# Patient Record
Sex: Female | Born: 1970
Health system: Southern US, Community
[De-identification: ages and names within clinical notes are randomized; demographics above are authoritative.]

## PROBLEM LIST (undated history)

## (undated) DIAGNOSIS — C419 Malignant neoplasm of bone and articular cartilage, unspecified: Secondary | ICD-10-CM

## (undated) DIAGNOSIS — C7951 Secondary malignant neoplasm of bone: Secondary | ICD-10-CM

## (undated) DIAGNOSIS — R918 Other nonspecific abnormal finding of lung field: Secondary | ICD-10-CM

## (undated) DIAGNOSIS — E876 Hypokalemia: Secondary | ICD-10-CM

## (undated) DIAGNOSIS — C7970 Secondary malignant neoplasm of unspecified adrenal gland: Secondary | ICD-10-CM

## (undated) DIAGNOSIS — IMO0001 Reserved for inherently not codable concepts without codable children: Secondary | ICD-10-CM

## (undated) DIAGNOSIS — J069 Acute upper respiratory infection, unspecified: Secondary | ICD-10-CM

## (undated) DIAGNOSIS — C801 Malignant (primary) neoplasm, unspecified: Secondary | ICD-10-CM

## (undated) DIAGNOSIS — C7931 Secondary malignant neoplasm of brain: Secondary | ICD-10-CM

## (undated) DIAGNOSIS — IMO0002 Reserved for concepts with insufficient information to code with codable children: Secondary | ICD-10-CM

## (undated) DIAGNOSIS — R042 Hemoptysis: Secondary | ICD-10-CM

## (undated) DIAGNOSIS — J189 Pneumonia, unspecified organism: Secondary | ICD-10-CM

## (undated) HISTORY — DX: Reserved for concepts with insufficient information to code with codable children: IMO0002

## (undated) HISTORY — DX: Secondary malignant neoplasm of bone: C79.51

## (undated) HISTORY — DX: Reserved for inherently not codable concepts without codable children: IMO0001

## (undated) SURGERY — VIDEO BRONCHOSCOPY WITHOUT FLUORO
Anesthesia: Moderate Sedation

---

## 2002-10-06 ENCOUNTER — Other Ambulatory Visit: Admission: RE | Admit: 2002-10-06 | Discharge: 2002-10-06 | Payer: Self-pay

## 2014-01-22 ENCOUNTER — Other Ambulatory Visit: Payer: Self-pay | Admitting: Family Medicine

## 2014-01-22 DIAGNOSIS — Z87898 Personal history of other specified conditions: Secondary | ICD-10-CM

## 2014-01-22 DIAGNOSIS — M25559 Pain in unspecified hip: Secondary | ICD-10-CM

## 2014-01-29 ENCOUNTER — Ambulatory Visit
Admission: RE | Admit: 2014-01-29 | Discharge: 2014-01-29 | Disposition: A | Discharge status: Home / Self Care | Payer: No Typology Code available for payment source | Source: Ambulatory Visit | Attending: Family Medicine | Admitting: Family Medicine

## 2014-01-29 DIAGNOSIS — M25559 Pain in unspecified hip: Secondary | ICD-10-CM

## 2014-01-29 DIAGNOSIS — Z87898 Personal history of other specified conditions: Secondary | ICD-10-CM

## 2014-04-27 ENCOUNTER — Other Ambulatory Visit: Payer: Self-pay | Admitting: Family Medicine

## 2014-04-27 ENCOUNTER — Ambulatory Visit
Admission: RE | Admit: 2014-04-27 | Discharge: 2014-04-27 | Disposition: A | Discharge status: Home / Self Care | Payer: No Typology Code available for payment source | Source: Ambulatory Visit | Attending: Family Medicine | Admitting: Family Medicine

## 2014-04-27 DIAGNOSIS — M89319 Hypertrophy of bone, unspecified shoulder: Secondary | ICD-10-CM

## 2014-06-07 ENCOUNTER — Other Ambulatory Visit: Payer: Self-pay | Admitting: Family Medicine

## 2014-06-07 DIAGNOSIS — M5432 Sciatica, left side: Secondary | ICD-10-CM

## 2014-06-21 ENCOUNTER — Ambulatory Visit
Admission: RE | Admit: 2014-06-21 | Discharge: 2014-06-21 | Disposition: A | Discharge status: Home / Self Care | Payer: No Typology Code available for payment source | Source: Ambulatory Visit | Attending: Family Medicine | Admitting: Family Medicine

## 2014-06-21 DIAGNOSIS — M5432 Sciatica, left side: Secondary | ICD-10-CM

## 2014-08-06 ENCOUNTER — Other Ambulatory Visit (HOSPITAL_COMMUNITY): Payer: Self-pay | Admitting: Family Medicine

## 2014-08-06 ENCOUNTER — Ambulatory Visit (HOSPITAL_COMMUNITY)
Admission: RE | Admit: 2014-08-06 | Discharge: 2014-08-06 | Disposition: A | Discharge status: Home / Self Care | Payer: No Typology Code available for payment source | Source: Ambulatory Visit | Attending: Family Medicine | Admitting: Family Medicine

## 2014-08-06 DIAGNOSIS — J189 Pneumonia, unspecified organism: Secondary | ICD-10-CM | POA: Insufficient documentation

## 2014-08-06 DIAGNOSIS — R042 Hemoptysis: Secondary | ICD-10-CM

## 2014-08-07 ENCOUNTER — Encounter (HOSPITAL_COMMUNITY): Payer: Self-pay | Admitting: Emergency Medicine

## 2014-08-07 ENCOUNTER — Inpatient Hospital Stay (HOSPITAL_COMMUNITY)
Admission: EM | Admit: 2014-08-07 | Discharge: 2014-08-17 | DRG: 981 | Disposition: A | Discharge status: Home / Self Care | Payer: No Typology Code available for payment source | Attending: Internal Medicine | Admitting: Internal Medicine

## 2014-08-07 ENCOUNTER — Emergency Department (HOSPITAL_COMMUNITY): Payer: No Typology Code available for payment source

## 2014-08-07 DIAGNOSIS — R918 Other nonspecific abnormal finding of lung field: Secondary | ICD-10-CM | POA: Diagnosis not present

## 2014-08-07 DIAGNOSIS — Y95 Nosocomial condition: Secondary | ICD-10-CM | POA: Diagnosis not present

## 2014-08-07 DIAGNOSIS — R000 Tachycardia, unspecified: Secondary | ICD-10-CM | POA: Diagnosis not present

## 2014-08-07 DIAGNOSIS — C3490 Malignant neoplasm of unspecified part of unspecified bronchus or lung: Secondary | ICD-10-CM

## 2014-08-07 DIAGNOSIS — C7931 Secondary malignant neoplasm of brain: Secondary | ICD-10-CM | POA: Diagnosis present

## 2014-08-07 DIAGNOSIS — Z515 Encounter for palliative care: Secondary | ICD-10-CM | POA: Diagnosis not present

## 2014-08-07 DIAGNOSIS — J189 Pneumonia, unspecified organism: Secondary | ICD-10-CM | POA: Diagnosis present

## 2014-08-07 DIAGNOSIS — C7972 Secondary malignant neoplasm of left adrenal gland: Secondary | ICD-10-CM | POA: Diagnosis present

## 2014-08-07 DIAGNOSIS — E876 Hypokalemia: Secondary | ICD-10-CM | POA: Diagnosis not present

## 2014-08-07 DIAGNOSIS — R05 Cough: Secondary | ICD-10-CM | POA: Diagnosis not present

## 2014-08-07 DIAGNOSIS — C3431 Malignant neoplasm of lower lobe, right bronchus or lung: Secondary | ICD-10-CM | POA: Diagnosis present

## 2014-08-07 DIAGNOSIS — E278 Other specified disorders of adrenal gland: Secondary | ICD-10-CM | POA: Insufficient documentation

## 2014-08-07 DIAGNOSIS — R0602 Shortness of breath: Secondary | ICD-10-CM

## 2014-08-07 DIAGNOSIS — R042 Hemoptysis: Secondary | ICD-10-CM | POA: Diagnosis present

## 2014-08-07 DIAGNOSIS — C7951 Secondary malignant neoplasm of bone: Secondary | ICD-10-CM | POA: Diagnosis present

## 2014-08-07 DIAGNOSIS — J9601 Acute respiratory failure with hypoxia: Secondary | ICD-10-CM | POA: Diagnosis not present

## 2014-08-07 DIAGNOSIS — E279 Disorder of adrenal gland, unspecified: Secondary | ICD-10-CM | POA: Diagnosis not present

## 2014-08-07 HISTORY — DX: Malignant (primary) neoplasm, unspecified: C80.1

## 2014-08-07 LAB — BASIC METABOLIC PANEL
Anion gap: 9 (ref 5–15)
BUN: 6 mg/dL (ref 6–20)
CALCIUM: 8.8 mg/dL — AB (ref 8.9–10.3)
CO2: 25 mmol/L (ref 22–32)
Chloride: 103 mmol/L (ref 101–111)
Creatinine, Ser: 0.55 mg/dL (ref 0.44–1.00)
Glucose, Bld: 89 mg/dL (ref 65–99)
POTASSIUM: 3.7 mmol/L (ref 3.5–5.1)
Sodium: 137 mmol/L (ref 135–145)

## 2014-08-07 LAB — CBC WITH DIFFERENTIAL/PLATELET
Basophils Absolute: 0.1 10*3/uL (ref 0.0–0.1)
Basophils Relative: 0 % (ref 0–1)
EOS ABS: 0.1 10*3/uL (ref 0.0–0.7)
EOS PCT: 1 % (ref 0–5)
HEMATOCRIT: 38.9 % (ref 36.0–46.0)
Hemoglobin: 13 g/dL (ref 12.0–15.0)
LYMPHS ABS: 2.7 10*3/uL (ref 0.7–4.0)
LYMPHS PCT: 21 % (ref 12–46)
MCH: 30.5 pg (ref 26.0–34.0)
MCHC: 33.4 g/dL (ref 30.0–36.0)
MCV: 91.3 fL (ref 78.0–100.0)
MONOS PCT: 7 % (ref 3–12)
Monocytes Absolute: 0.9 10*3/uL (ref 0.1–1.0)
Neutro Abs: 8.9 10*3/uL — ABNORMAL HIGH (ref 1.7–7.7)
Neutrophils Relative %: 71 % (ref 43–77)
Platelets: 562 10*3/uL — ABNORMAL HIGH (ref 150–400)
RBC: 4.26 MIL/uL (ref 3.87–5.11)
RDW: 13 % (ref 11.5–15.5)
WBC: 12.6 10*3/uL — AB (ref 4.0–10.5)

## 2014-08-07 MED ORDER — ONDANSETRON HCL 4 MG/2ML IJ SOLN
4.0000 mg | Freq: Four times a day (QID) | INTRAMUSCULAR | Status: DC | PRN
  Administered 2014-08-10: 4 mg via INTRAVENOUS
  Filled 2014-08-07: qty 2

## 2014-08-07 MED ORDER — IOHEXOL 350 MG/ML SOLN
80.0000 mL | Freq: Once | INTRAVENOUS | Status: AC | PRN
  Administered 2014-08-07: 80 mL via INTRAVENOUS

## 2014-08-07 MED ORDER — DEXTROSE 5 % IV SOLN
500.0000 mg | INTRAVENOUS | Status: DC
  Filled 2014-08-07: qty 500

## 2014-08-07 MED ORDER — ACETAMINOPHEN 325 MG PO TABS
650.0000 mg | ORAL_TABLET | Freq: Four times a day (QID) | ORAL | Status: DC | PRN
  Administered 2014-08-08 – 2014-08-15 (×10): 650 mg via ORAL
  Filled 2014-08-07 (×11): qty 2

## 2014-08-07 MED ORDER — ZOLPIDEM TARTRATE 5 MG PO TABS
5.0000 mg | ORAL_TABLET | Freq: Once | ORAL | Status: AC
  Administered 2014-08-08: 5 mg via ORAL
  Filled 2014-08-07: qty 1

## 2014-08-07 MED ORDER — SODIUM CHLORIDE 0.9 % IV SOLN
INTRAVENOUS | Status: DC
Start: 2014-08-07 — End: 2014-08-08
  Administered 2014-08-07 – 2014-08-08 (×2): via INTRAVENOUS

## 2014-08-07 MED ORDER — ALBUTEROL SULFATE (2.5 MG/3ML) 0.083% IN NEBU: 2.5000 mg | INHALATION_SOLUTION | RESPIRATORY_TRACT | Status: DC | PRN

## 2014-08-07 MED ORDER — ONDANSETRON HCL 4 MG PO TABS
4.0000 mg | ORAL_TABLET | Freq: Four times a day (QID) | ORAL | Status: DC | PRN
  Filled 2014-08-07: qty 1

## 2014-08-07 MED ORDER — AZITHROMYCIN 500 MG IV SOLR
500.0000 mg | Freq: Once | INTRAVENOUS | Status: AC
  Administered 2014-08-07: 500 mg via INTRAVENOUS
  Filled 2014-08-07: qty 500

## 2014-08-07 MED ORDER — ACETAMINOPHEN 650 MG RE SUPP: 650.0000 mg | Freq: Four times a day (QID) | RECTAL | Status: DC | PRN

## 2014-08-07 MED ORDER — CEFTRIAXONE SODIUM IN DEXTROSE 20 MG/ML IV SOLN
1.0000 g | INTRAVENOUS | Status: DC
  Filled 2014-08-07: qty 50

## 2014-08-07 MED ORDER — DEXTROSE 5 % IV SOLN
1.0000 g | Freq: Once | INTRAVENOUS | Status: AC
  Administered 2014-08-07: 1 g via INTRAVENOUS
  Filled 2014-08-07: qty 10

## 2014-08-07 NOTE — Progress Notes (Signed)
08/07/14 Patient came to Room 5 W 21 DX  Of CAP, Bedrest, Regular diet, SCD, IV site Right A/C with Normal  Saline at 75cc/hr has IV ABT Zithromax running at current time, Full code.

## 2014-08-07 NOTE — H&P (Signed)
PCP:   [name], MD   Chief Complaint:  Hemoptysis  HPI: 44 year old female with no significant medical problems, saw PCP yesterday for productive cough for one week. PCP sent her for a chest x-ray which showed pneumonia and patient was called by the PCP to come to the ED for prescription for antibiotic. Patient in the ED complained of coughing up blood clots, CT angiogram was done which showed right lower lobe lung mass, also shows metastasis to the bone and  Left adrenal gland. Patient denies any chest pain, no shortness of breath. No nausea vomiting or diarrhea. No fever no dysuria urgency frequency of urination. No previous history of cancer no family history of cancer. Patient is not a smoker.  Allergies:  No Known Allergies    Prior to Admission medications   Not on File    Social History:  reports that she has never smoked. She does not have any smokeless tobacco history on file. She reports that she does not drink alcohol or use illicit drugs.    Filed Weights   08/07/14 1102  Weight: 50.349 kg (111 lb)    All the positives are listed in BOLD  Review of Systems:  HEENT: Headache, blurred vision, runny nose, sore throat Neck: Hypothyroidism, hyperthyroidism,,lymphadenopathy Chest : Shortness of breath, history of COPD, Asthma Heart : Chest pain, history of coronary arterey disease GI:  Nausea, vomiting, diarrhea, constipation, GERD GU: Dysuria, urgency, frequency of urination, hematuria Neuro: Stroke, seizures, syncope Psych: Depression, anxiety, hallucinations   Physical Exam: Blood pressure 107/74, pulse 94, temperature 98.6 F (37 C), resp. rate 21, height '4\' 11"'$  (1.499 m), weight 50.349 kg (111 lb), last menstrual period 06/27/2014, SpO2 98 %. Constitutional:   Patient is a well-developed and well-nourished female* in no acute distress and cooperative with exam. Head: Normocephalic and atraumatic Mouth: Mucus membranes moist Eyes: PERRL,  EOMI, conjunctivae normal Neck: Supple, No Thyromegaly Cardiovascular: RRR, S1 normal, S2 normal Pulmonary/Chest: CTAB, no wheezes, rales, or rhonchi Abdominal: Soft. Non-tender, non-distended, bowel sounds are normal, no masses, organomegaly, or guarding present.  Neurological: A&O x3, Strength is normal and symmetric bilaterally, cranial nerve II-XII are grossly intact, no focal motor deficit, sensory intact to light touch bilaterally.  Extremities : No Cyanosis, Clubbing or Edema  Labs on Admission:  Basic Metabolic Panel:  Recent Labs Lab 08/07/14 1300  NA 137  K 3.7  CL 103  CO2 25  GLUCOSE 89  BUN 6  CREATININE 0.55  CALCIUM 8.8*   CBC:  Recent Labs Lab 08/07/14 1300  WBC 12.6*  NEUTROABS 8.9*  HGB 13.0  HCT 38.9  MCV 91.3  PLT 562*   CBG: No results for input(s): GLUCAP in the last 168 hours.  Radiological Exams on Admission: Dg Chest 2 View  08/06/2014   CLINICAL DATA:  Hemoptysis and chest pain  EXAM: CHEST  2 VIEW  COMPARISON:  None.  FINDINGS: There is focal airspace consolidation in the right middle lobe. Lungs elsewhere clear. Heart size and pulmonary vascularity are normal. No adenopathy. No bone lesions.  IMPRESSION: Right middle lobe pneumonia. Lungs otherwise clear. Followup PA and lateral chest radiographs recommended in 3-4 weeks following trial of antibiotic therapy to ensure resolution and exclude underlying malignancy.  These results will be called to the ordering clinician or representative by the Radiologist Assistant, and communication documented in the PACS or zVision Dashboard.   Electronically Signed   By: Lowella Grip III M.D.   On: 08/06/2014 17:22  Ct Angio Chest Pe W/cm &/or Wo Cm  08/07/2014   CLINICAL DATA:  Right middle lobe pneumonia on chest x-ray, recent hemoptysis  EXAM: CT ANGIOGRAPHY CHEST WITH CONTRAST  TECHNIQUE: Multidetector CT imaging of the chest was performed using the standard protocol during bolus administration of  intravenous contrast. Multiplanar CT image reconstructions and MIPs were obtained to evaluate the vascular anatomy.  CONTRAST:  57m OMNIPAQUE IOHEXOL 350 MG/ML SOLN  COMPARISON:  Chest x-ray from the previous day  FINDINGS: Left lung is well aerated without evidence focal infiltrate or sizable effusion. The right lung is also well aerated and demonstrates some mild infiltrate within the right middle lobe. In the right lower lobe emanating from the hilum inferiorly there is a 4.4 by 3.0 by 3.6 cm peripherally spiculated mass lesion consistent with a primary pulmonary neoplasm. Distal to this there are changes of postobstructive infiltrate. There is occlusion of the lower lobe bronchus as well as significant attenuation of the lower lobe pulmonary artery. Filling defect is identified within the bronchus intermedius consistent with local in growth. Right hilar adenopathy measuring 14 mm in short axis is seen.  The thoracic aorta in its branches are within normal limits. Pulmonary artery demonstrates a normal branching pattern without evidence of pulmonary embolism. Narrowing is noted in the right lower lobe branches consistent with the underlying mass lesion.  Scanning into the upper abdomen demonstrates evidence of a 3.8 cm mass in the left adrenal gland consistent with metastatic disease. The bony structures show patchy lucencies in the thoracic vertebral bodies consistent with metastatic disease.  Review of the MIP images confirms the above findings.  IMPRESSION: Right lower lobe mass lesion with postobstructive changes and invasion into the bronchus intermedius as described consistent with a primary pulmonary neoplasm. There are changes consistent with left adrenal metastatic disease as well as bony metastatic disease.  Mild right middle lobe infiltrate.   Electronically Signed   By: MInez CatalinaM.D.   On: 08/07/2014 14:24     Assessment/Plan Active Problems:   CAP (community acquired pneumonia)    Pneumonia   Lung mass   Hemoptysis  Pneumonia Patient presenting with hemoptysis and mild right middle lobe infiltrate. Patient started on Rocephin and Zithromax. Will obtain blood cultures 2.   Lung mass CT angiogram of the chest shows right lower lobe mass lesion with postoperative changes and inability to the bronchus. Also metastasis to left adrenal as well as thoracic vertebrae. I called and discussed with pulmonologist on call, who reviewed the films and recommended bronchoscopy. Will keep the patient nothing by mouth after midnight for possible bronch in am.   Code status: Full code   Family discussion: Admission, patients condition and plan of care including tests being ordered have been discussed with the patient and *her family's bedside* who indicate understanding and agree with the plan and Code Status.   Time Spent on Admission: 60 min  [facility] Pager: [phone]/23/2016, 4:27 PM  If 7PM-7AM, please contact night-coverage  [email or web address]  Password [email or web address]

## 2014-08-07 NOTE — ED Notes (Signed)
attempted report 

## 2014-08-07 NOTE — ED Provider Notes (Signed)
CSN: [id number]     Arrival date & time 08/07/14  1051 History   First MD Initiated Contact with Patient 08/07/14 1122     Chief Complaint  Patient presents with  . Cough     (Consider location/radiation/quality/duration/timing/severity/associated sxs/prior Treatment) HPI Patient is a 44 year old female past medical history who presents the ER complaining of hemoptysis. Patient states she was seen and evaluated for the same complaint in her PCP yesterday. They began a workup including a chest x-ray and blood work. PCP called patient today to advise her to come to the emergency room for further evaluation and rule out of pulmonary embolism. Patient states that for the past day and a half she has been expressing mild shortness of breath, hemoptysis, productive cough. Patient denies chest pain, no dizziness, blurred vision, headache, nausea, vomiting, fever, abdominal pain, dysuria.  History reviewed. No pertinent past medical history. History reviewed. No pertinent past surgical history. No family history on file. History  Substance Use Topics  . Smoking status: Never Smoker   . Smokeless tobacco: Not on file  . Alcohol Use: No   OB History    No data available     Review of Systems  Constitutional: Negative for fever.  HENT: Negative for trouble swallowing.   Eyes: Negative for visual disturbance.  Respiratory: Negative for shortness of breath.        Hemoptysis  Cardiovascular: Negative for chest pain.  Gastrointestinal: Negative for nausea, vomiting and abdominal pain.  Genitourinary: Negative for dysuria.  Musculoskeletal: Negative for neck pain.  Skin: Negative for rash.  Neurological: Negative for dizziness, weakness and numbness.  Psychiatric/Behavioral: Negative.       Allergies  Review of patient's allergies indicates no known allergies.  Home Medications   Prior to Admission medications   Not on File   BP 107/74 mmHg  Pulse 94  Temp(Src) 98.6 F (37 C)   Resp 21  Ht '4\' 11"'$  (1.499 m)  Wt 111 lb (50.349 kg)  BMI 22.41 kg/m2  SpO2 98%  LMP 06/27/2014 Physical Exam  Constitutional: She is oriented to person, place, and time. She appears well-developed and well-nourished. No distress.  HENT:  Head: Normocephalic and atraumatic.  Mouth/Throat: Oropharynx is clear and moist. No oropharyngeal exudate.  Eyes: Right eye exhibits no discharge. Left eye exhibits no discharge. No scleral icterus.  Neck: Normal range of motion.  Cardiovascular: Normal rate, regular rhythm and normal heart sounds.   No murmur heard. Pulmonary/Chest: Effort normal and breath sounds normal. No accessory muscle usage. No tachypnea. No respiratory distress.  Abdominal: Soft. There is no tenderness.  Musculoskeletal: Normal range of motion. She exhibits no edema or tenderness.  Neurological: She is alert and oriented to person, place, and time. No cranial nerve deficit. Coordination normal.  Skin: Skin is warm and dry. No rash noted. She is not diaphoretic.  Psychiatric: She has a normal mood and affect.  Nursing note and vitals reviewed.   ED Course  Procedures (including critical care time) Labs Review Labs Reviewed  CBC WITH DIFFERENTIAL/PLATELET - Abnormal; Notable for the following:    WBC 12.6 (*)    Platelets 562 (*)    Neutro Abs 8.9 (*)    All other components within normal limits  BASIC METABOLIC PANEL - Abnormal; Notable for the following:    Calcium 8.8 (*)    All other components within normal limits  CULTURE, BLOOD (ROUTINE X 2)  CULTURE, BLOOD (ROUTINE X 2)    Imaging Review Dg  Chest 2 View  08/06/2014   CLINICAL DATA:  Hemoptysis and chest pain  EXAM: CHEST  2 VIEW  COMPARISON:  None.  FINDINGS: There is focal airspace consolidation in the right middle lobe. Lungs elsewhere clear. Heart size and pulmonary vascularity are normal. No adenopathy. No bone lesions.  IMPRESSION: Right middle lobe pneumonia. Lungs otherwise clear. Followup PA and  lateral chest radiographs recommended in 3-4 weeks following trial of antibiotic therapy to ensure resolution and exclude underlying malignancy.  These results will be called to the ordering clinician or representative by the Radiologist Assistant, and communication documented in the PACS or zVision Dashboard.   Electronically Signed   By: Lowella Grip III M.D.   On: 08/06/2014 17:22   Ct Angio Chest Pe W/cm &/or Wo Cm  08/07/2014   CLINICAL DATA:  Right middle lobe pneumonia on chest x-ray, recent hemoptysis  EXAM: CT ANGIOGRAPHY CHEST WITH CONTRAST  TECHNIQUE: Multidetector CT imaging of the chest was performed using the standard protocol during bolus administration of intravenous contrast. Multiplanar CT image reconstructions and MIPs were obtained to evaluate the vascular anatomy.  CONTRAST:  26m OMNIPAQUE IOHEXOL 350 MG/ML SOLN  COMPARISON:  Chest x-ray from the previous day  FINDINGS: Left lung is well aerated without evidence focal infiltrate or sizable effusion. The right lung is also well aerated and demonstrates some mild infiltrate within the right middle lobe. In the right lower lobe emanating from the hilum inferiorly there is a 4.4 by 3.0 by 3.6 cm peripherally spiculated mass lesion consistent with a primary pulmonary neoplasm. Distal to this there are changes of postobstructive infiltrate. There is occlusion of the lower lobe bronchus as well as significant attenuation of the lower lobe pulmonary artery. Filling defect is identified within the bronchus intermedius consistent with local in growth. Right hilar adenopathy measuring 14 mm in short axis is seen.  The thoracic aorta in its branches are within normal limits. Pulmonary artery demonstrates a normal branching pattern without evidence of pulmonary embolism. Narrowing is noted in the right lower lobe branches consistent with the underlying mass lesion.  Scanning into the upper abdomen demonstrates evidence of a 3.8 cm mass in the left  adrenal gland consistent with metastatic disease. The bony structures show patchy lucencies in the thoracic vertebral bodies consistent with metastatic disease.  Review of the MIP images confirms the above findings.  IMPRESSION: Right lower lobe mass lesion with postobstructive changes and invasion into the bronchus intermedius as described consistent with a primary pulmonary neoplasm. There are changes consistent with left adrenal metastatic disease as well as bony metastatic disease.  Mild right middle lobe infiltrate.   Electronically Signed   By: MInez CatalinaM.D.   On: 08/07/2014 14:24     EKG Interpretation None      MDM   Final diagnoses:  Hemoptysis    Patient here in evaluation for hemoptysis. Patient showed me a picture of her hemoptysis from yesterday, which appear to be significant. Patient continues to have hemoptysis in the ED, this is obvious and significant. Patient is well-appearing, afebrile, hemodynamically stable and in no acute distress. Reviewing x-ray taken yesterday, there is evidence of a right middle lobe pneumonia Tachycardia from low 100s to 117 range , patient was followed up with CTNGOchest for rule out of PE.  CT angios chest with impression: Right lower lobe mass lesion with postobstructive changes and invasion into the bronchus intermedius as described consistent with a primary pulmonary neoplasm. There are changes consistent with  left adrenal metastatic disease as well as bony metastatic disease.  Mild right middle lobe infiltrate.  No hypoxia, however patient admitted to medicine for community-acquired pneumonia in the setting of a right lower lobe mass lesion with postobstructive change for IV antibiotics and further workup and possible consult to oncology.The patient appears reasonably stabilized for admission considering the current resources, flow, and capabilities available in the ED at this time, and I doubt any other [facility] requiring further screening  and/or treatment in the ED prior to admission.  BP 107/74 mmHg  Pulse 94  Temp(Src) 98.6 F (37 C)  Resp 21  Ht '4\' 11"'$  (1.499 m)  Wt 111 lb (50.349 kg)  BMI 22.41 kg/m2  SpO2 98%  LMP 06/27/2014  Signed,  Dahlia Bailiff, PA-C 4:39 PM  Patient seen and discussed with Dr. Blanchie Dessert, MD    Dahlia Bailiff, PA-C 08/07/14 1639  Blanchie Dessert, MD 08/08/14 1424

## 2014-08-07 NOTE — ED Notes (Signed)
Pt. Was here from Dr. Arelia Sneddon office cause he thinks I have blood in my lungs. Talked with the nurse and she stated Dr. Arelia Sneddon thinks she might have an Embolism. Pt. Stated, I've been coughing for a week and sometimes with blood in there.

## 2014-08-07 NOTE — ED Notes (Signed)
Pt saw her PCP who sent her for a chest xray for productive cough for one week. Pt MD called her this morning and told her to come to the ED to receive a prescription for antibiotics and for blood work. Pt showed me a picture of blood clots that she coughed up into the sink yesterday. Denies recent travel. Denies fevers.

## 2014-08-08 LAB — CBC
HCT: 34.7 % — ABNORMAL LOW (ref 36.0–46.0)
Hemoglobin: 11.5 g/dL — ABNORMAL LOW (ref 12.0–15.0)
MCH: 30.3 pg (ref 26.0–34.0)
MCHC: 33.1 g/dL (ref 30.0–36.0)
MCV: 91.6 fL (ref 78.0–100.0)
Platelets: 483 10*3/uL — ABNORMAL HIGH (ref 150–400)
RBC: 3.79 MIL/uL — ABNORMAL LOW (ref 3.87–5.11)
RDW: 12.9 % (ref 11.5–15.5)
WBC: 11.6 10*3/uL — AB (ref 4.0–10.5)

## 2014-08-08 LAB — COMPREHENSIVE METABOLIC PANEL
ALBUMIN: 2.7 g/dL — AB (ref 3.5–5.0)
ALK PHOS: 145 U/L — AB (ref 38–126)
ALT: 14 U/L (ref 14–54)
AST: 16 U/L (ref 15–41)
Anion gap: 7 (ref 5–15)
BUN: 5 mg/dL — ABNORMAL LOW (ref 6–20)
CO2: 25 mmol/L (ref 22–32)
Calcium: 8.5 mg/dL — ABNORMAL LOW (ref 8.9–10.3)
Chloride: 106 mmol/L (ref 101–111)
Creatinine, Ser: 0.46 mg/dL (ref 0.44–1.00)
GFR calc Af Amer: >60 mL/min (ref >60)
GFR calc non Af Amer: >60 mL/min (ref >60)
Glucose, Bld: 98 mg/dL (ref 65–99)
POTASSIUM: 3.4 mmol/L — AB (ref 3.5–5.1)
SODIUM: 138 mmol/L (ref 135–145)
TOTAL PROTEIN: 6.1 g/dL — AB (ref 6.5–8.1)
Total Bilirubin: 0.5 mg/dL (ref 0.3–1.2)

## 2014-08-08 MED ORDER — POTASSIUM CHLORIDE CRYS ER 20 MEQ PO TBCR
40.0000 meq | EXTENDED_RELEASE_TABLET | Freq: Once | ORAL | Status: AC
  Administered 2014-08-08: 40 meq via ORAL
  Filled 2014-08-08: qty 2

## 2014-08-08 MED ORDER — SODIUM CHLORIDE 0.9 % IV SOLN
1.5000 g | Freq: Four times a day (QID) | INTRAVENOUS | Status: DC
  Administered 2014-08-08 – 2014-08-13 (×20): 1.5 g via INTRAVENOUS
  Filled 2014-08-08 (×25): qty 1.5

## 2014-08-08 NOTE — Consult Note (Signed)
Name: Deborah Foster MRN: [id number] DOB: October 08, 1970    ADMISSION DATE:  08/07/2014 CONSULTATION DATE:  08/08/14 REFERRING MD :  FPTS  CHIEF COMPLAINT:  Lung mass  BRIEF PATIENT DESCRIPTION: 44 yo F non-smoker presented with cough x 1 week, possibly hemoptysis. CXR suggested pneumonia but CT shows RLL mass with mets to left adrenal and thoracic vertebrae.  SIGNIFICANT EVENTS    STUDIES:  CT chest 7/23   HISTORY OF PRESENT ILLNESS:  44 yo nonsmoking F in good usual health till onset of cough 1 week PTA. She had apparently reported hemoptysis of clots at one pint, but denied blood on my questioning. CT images reviewed by me. There is a RLL mass occluding the RLL bronchus with probable invasion of the bronchus intermedius. There are bone mets in thoracic vertebrae and met in left adrenal. She notices little systemic discomfort- not dyspneic, no pain, no adenopathy.  PAST MEDICAL HISTORY :  Limited to childbirth x 3 Prior to Admission medications   Not on File   No Known Allergies  FAMILY HISTORY:  family history is not on file. SOCIAL HISTORY:  reports that she has never smoked. She does not have any smokeless tobacco history on file. She reports that she does not drink alcohol or use illicit drugs.  REVIEW OF SYSTEMS:   Constitutional: Negative for fever, chills, weight loss, malaise/fatigue and diaphoresis.  HENT: Negative for hearing loss, ear pain, nosebleeds, congestion, sore throat, neck pain, tinnitus and ear discharge.   Eyes: Negative for blurred vision, double vision, photophobia, pain, discharge and redness.  Respiratory: +cough,No- hemoptysis, sputum production, shortness of breath, wheezing and stridor.   Cardiovascular: Negative for chest pain, palpitations, orthopnea, claudication, leg swelling and PND.  Gastrointestinal: Negative for heartburn, nausea, vomiting, abdominal pain, diarrhea, constipation, blood in stool and melena.  Genitourinary: Negative for dysuria,  urgency, frequency, hematuria and flank pain.  Musculoskeletal: Negative for myalgias, back pain, joint pain and falls.  Skin: Negative for itching and rash.  Neurological: Negative for dizziness, tingling, tremors, sensory change, speech change, focal weakness, seizures, loss of consciousness, weakness and headaches.  Endo/Heme/Allergies: Negative for environmental allergies and polydipsia. Does not bruise/bleed easily.  SUBJECTIVE:   VITAL SIGNS: Temp:  [97.5 F (36.4 C)-98.7 F (37.1 C)] 98.4 F (36.9 C) (07/24 0546) Pulse Rate:  [82-117] 95 (07/24 0546) Resp:  [15-25] 15 (07/24 0546) BP: (96-127)/(43-83) 101/58 mmHg (07/24 0546) SpO2:  [95 %-100 %] 100 % (07/24 0546) Weight:  [50.349 kg (111 lb)] 50.349 kg (111 lb) (07/23 1635)  PHYSICAL EXAMINATION: General:  WDWN woman, fairly good English, NAD, conversational, sitting in bed Neuro:  Non-focal, oriented,  HEENT:  PERRLA, speech clear, mucosa normal, gross hearing and vision intact Cardiovascular:  RRR, no m/g/r Lungs: diminished R base, no rub, rhonchi, wheeze or cough Abdomen:  Soft, BS present Musculoskeletal:  Normal muscle tone, moving well Skin:No rash or rhonchi   Recent Labs Lab 08/07/14 1300 08/08/14 0458  NA 137 138  K 3.7 3.4*  CL 103 106  CO2 25 25  BUN 6 5*  CREATININE 0.55 0.46  GLUCOSE 89 98    Recent Labs Lab 08/07/14 1300 08/08/14 0458  HGB 13.0 11.5*  HCT 38.9 34.7*  WBC 12.6* 11.6*  PLT 562* 483*   Dg Chest 2 View  08/06/2014   CLINICAL DATA:  Hemoptysis and chest pain  EXAM: CHEST  2 VIEW  COMPARISON:  None.  FINDINGS: There is focal airspace consolidation in the right middle lobe.  Lungs elsewhere clear. Heart size and pulmonary vascularity are normal. No adenopathy. No bone lesions.  IMPRESSION: Right middle lobe pneumonia. Lungs otherwise clear. Followup PA and lateral chest radiographs recommended in 3-4 weeks following trial of antibiotic therapy to ensure resolution and exclude  underlying malignancy.  These results will be called to the ordering clinician or representative by the Radiologist Assistant, and communication documented in the PACS or zVision Dashboard.   Electronically Signed   By: Lowella Grip III M.D.   On: 08/06/2014 17:22   Ct Angio Chest Pe W/cm &/or Wo Cm  08/07/2014   CLINICAL DATA:  Right middle lobe pneumonia on chest x-ray, recent hemoptysis  EXAM: CT ANGIOGRAPHY CHEST WITH CONTRAST  TECHNIQUE: Multidetector CT imaging of the chest was performed using the standard protocol during bolus administration of intravenous contrast. Multiplanar CT image reconstructions and MIPs were obtained to evaluate the vascular anatomy.  CONTRAST:  76mL OMNIPAQUE IOHEXOL 350 MG/ML SOLN  COMPARISON:  Chest x-ray from the previous day  FINDINGS: Left lung is well aerated without evidence focal infiltrate or sizable effusion. The right lung is also well aerated and demonstrates some mild infiltrate within the right middle lobe. In the right lower lobe emanating from the hilum inferiorly there is a 4.4 by 3.0 by 3.6 cm peripherally spiculated mass lesion consistent with a primary pulmonary neoplasm. Distal to this there are changes of postobstructive infiltrate. There is occlusion of the lower lobe bronchus as well as significant attenuation of the lower lobe pulmonary artery. Filling defect is identified within the bronchus intermedius consistent with local in growth. Right hilar adenopathy measuring 14 mm in short axis is seen.  The thoracic aorta in its branches are within normal limits. Pulmonary artery demonstrates a normal branching pattern without evidence of pulmonary embolism. Narrowing is noted in the right lower lobe branches consistent with the underlying mass lesion.  Scanning into the upper abdomen demonstrates evidence of a 3.8 cm mass in the left adrenal gland consistent with metastatic disease. The bony structures show patchy lucencies in the thoracic vertebral  bodies consistent with metastatic disease.  Review of the MIP images confirms the above findings.  IMPRESSION: Right lower lobe mass lesion with postobstructive changes and invasion into the bronchus intermedius as described consistent with a primary pulmonary neoplasm. There are changes consistent with left adrenal metastatic disease as well as bony metastatic disease.  Mild right middle lobe infiltrate.   Electronically Signed   By: Inez Catalina M.D.   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 There is occlusion RLL bronchus and bronchoscopy is likely to yield diagnosis. I discussed the procedure, usual risks and purpose.  We can not provide bronchoscopy on weekends outside the ICU, and cannot access the schedule until Monday, so at best we would be looking at Monday or Tuesday. An alternative would be to ask IR about needle bone bx of a vertebral met. The patient is asking to go home now, because of family, to have this work-up as outpatient. If she is unwilling to stay, she could be discharged with your arrangement to be seen for bronchoscopy assessment as soon as possible at [facility].  I have told the nurse it is ok for her to eat now.   CD Annamaria Boots, MD Pulmonary and [place] Pager: [phone].    After 3:00 PM  [phone]  08/08/2014, 9:38 AM

## 2014-08-08 NOTE — Progress Notes (Signed)
ANTIBIOTIC CONSULT NOTE - INITIAL  Pharmacy Consult for Unasyn Indication: aspiration pneumonia  No Known Allergies  Patient Measurements: Height: '4\' 11"'$  (149.9 cm) Weight: 111 lb (50.349 kg) IBW/kg (Calculated) : 43.2  Vital Signs: Temp: 98.4 F (36.9 C) (07/24 0546) Temp Source: Oral (07/24 0546) BP: 101/58 mmHg (07/24 0546) Pulse Rate: 95 (07/24 0546) Intake/Output from previous day: 07/23 0701 - 07/24 0700 In: 308.8 [I.V.:58.8; IV Piggyback:250] Out: 400 [Urine:400] Intake/Output from this shift: Total I/O In: 1166.3 [P.O.:200; I.V.:966.3] Out: -   Labs:  Recent Labs  08/07/14 1300 08/08/14 0458  WBC 12.6* 11.6*  HGB 13.0 11.5*  PLT 562* 483*  CREATININE 0.55 0.46   Estimated Creatinine Clearance: 62.5 mL/min (by C-G formula based on Cr of 0.46).  Microbiology: No results found for this or any previous visit (from the past 720 hour(s)).  Medical History: History reviewed. No pertinent past medical history.  Medications:  Rocephin 7/23 Azithro 7/23  Assessment: 44 yo F initially being treated for CAP with azithromycin and ceftriaxone.  To change antibiotic today to cover for aspiration pneumonia with Unasyn.  Currently afebrile.   RLL Lung mass seen on CT scan suspicious for bronchogenic carcinoma with vertebral bone mets.  Plan for bronch 7/25.  Goal of Therapy:  Renal Dose Adjustment Eradication of Infection  Plan:  Unasyn 1.5 gm IV q6h Anticipate no further dose adjustments needed. Rx will sign off.  [employer], Pharm.D., BCPS Clinical Pharmacist Pager [phone] 08/08/2014 11:23 AM

## 2014-08-08 NOTE — Progress Notes (Signed)
Patient Demographics:    Deborah Foster, is a 44 y.o. female, DOB - [birth year], ION:[id number]  Admit date - 08/07/2014   Admitting Physician Oswald Hillock, MD  Outpatient Primary MD for the patient is [name], MD  LOS - 1   Chief Complaint  Patient presents with  . Cough        Subjective:    Deborah Foster today has, No headache, No chest pain, No abdominal pain - No Nausea, No new weakness tingling or numbness, mild  Cough - but no SOB.     Assessment  & Plan :     1. 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 In case pulmonary wants to do workup as outpatient will discharge her tomorrow after she seen.   2. Possible postobstructive pneumonia. Unasyn for now.    Code Status : Full  Family Communication  : none present  Disposition Plan  : Home 1-2 days  Consults  :  PCCM  Procedures  :   DVT Prophylaxis  :    SCDs   Lab Results  Component Value Date   PLT 483* 08/08/2014    Inpatient Medications  Scheduled Meds: . azithromycin  500 mg Intravenous Q24H  . cefTRIAXone (ROCEPHIN)  IV  1 g Intravenous Q24H   Continuous Infusions: . sodium chloride 75 mL/hr at 08/08/14 0751   PRN Meds:.acetaminophen **OR** acetaminophen, albuterol, ondansetron **OR** ondansetron (ZOFRAN) IV  Antibiotics  :     Anti-infectives    Start     Dose/Rate Route Frequency Ordered Stop   08/08/14 1600  azithromycin (ZITHROMAX) 500 mg in dextrose 5 % 250 mL IVPB     500 mg 250 mL/hr over 60 Minutes Intravenous Every 24 hours 08/07/14 1639     08/08/14 1500  cefTRIAXone (ROCEPHIN) 1 g in dextrose 5 % 50 mL IVPB - Premix     1 g 100 mL/hr over 30 Minutes Intravenous  Every 24 hours 08/07/14 1654     08/07/14 1515  cefTRIAXone (ROCEPHIN) 1 g in dextrose 5 % 50 mL IVPB     1 g 100 mL/hr over 30 Minutes Intravenous  Once 08/07/14 1501 08/07/14 1549   08/07/14 1515  azithromycin (ZITHROMAX) 500 mg in dextrose 5 % 250 mL IVPB     500 mg 250 mL/hr over 60 Minutes Intravenous  Once 08/07/14 1501 08/07/14 1721        Objective:   Filed Vitals:   08/07/14 1635 08/07/14 1724 08/07/14 2158 08/08/14 0546  BP:  96/43 112/53 101/58  Pulse:  82 99 95  Temp:  97.5 F (36.4 C) 98.7 F (37.1 C) 98.4 F (36.9 C)  TempSrc:  Oral Oral Oral  Resp:   16 15  Height: _0  (1.499 m)     Weight: 50.349 kg (111 lb)     SpO2:  100% 99% 100%    Wt Readings from Last 3 Encounters:  08/07/14 50.349 kg (111 lb)     Intake/Output Summary (Last 24 hours) at 08/08/14 1051 Last data filed at 08/08/14 1040  Gross per 24 hour  Intake  1475 ml  Output    400 ml  Net   1075 ml     Physical Exam  Awake Alert, Oriented X 3, No new F.N deficits, Normal affect [place].AT,PERRAL Supple Neck,No JVD, No cervical lymphadenopathy appriciated.  Symmetrical Chest wall movement, Good air movement bilaterally, CTAB RRR,No Gallops,Rubs or new Murmurs, No Parasternal Heave +ve B.Sounds, Abd Soft, No tenderness, No organomegaly appriciated, No rebound - guarding or rigidity. No Cyanosis, Clubbing or edema, No new Rash or bruise       Data Review:   Micro Results No results found for this or any previous visit (from the past 240 hour(s)).  Radiology Reports Dg Chest 2 View  08/06/2014   CLINICAL DATA:  Hemoptysis and chest pain  EXAM: CHEST  2 VIEW  COMPARISON:  None.  FINDINGS: There is focal airspace consolidation in the right middle lobe. Lungs elsewhere clear. Heart size and pulmonary vascularity are normal. No adenopathy. No bone lesions.  IMPRESSION: Right middle lobe pneumonia. Lungs otherwise clear. Followup PA and lateral chest radiographs recommended in 3-4 weeks  following trial of antibiotic therapy to ensure resolution and exclude underlying malignancy.  These results will be called to the ordering clinician or representative by the Radiologist Assistant, and communication documented in the PACS or zVision Dashboard.   Electronically Signed   By: Lowella Grip III M.D.   On: 08/06/2014 17:22   Ct Angio Chest Pe W/cm &/or Wo Cm  08/07/2014   CLINICAL DATA:  Right middle lobe pneumonia on chest x-ray, recent hemoptysis  EXAM: CT ANGIOGRAPHY CHEST WITH CONTRAST  TECHNIQUE: Multidetector CT imaging of the chest was performed using the standard protocol during bolus administration of intravenous contrast. Multiplanar CT image reconstructions and MIPs were obtained to evaluate the vascular anatomy.  CONTRAST:  54m OMNIPAQUE IOHEXOL 350 MG/ML SOLN  COMPARISON:  Chest x-ray from the previous day  FINDINGS: Left lung is well aerated without evidence focal infiltrate or sizable effusion. The right lung is also well aerated and demonstrates some mild infiltrate within the right middle lobe. In the right lower lobe emanating from the hilum inferiorly there is a 4.4 by 3.0 by 3.6 cm peripherally spiculated mass lesion consistent with a primary pulmonary neoplasm. Distal to this there are changes of postobstructive infiltrate. There is occlusion of the lower lobe bronchus as well as significant attenuation of the lower lobe pulmonary artery. Filling defect is identified within the bronchus intermedius consistent with local in growth. Right hilar adenopathy measuring 14 mm in short axis is seen.  The thoracic aorta in its branches are within normal limits. Pulmonary artery demonstrates a normal branching pattern without evidence of pulmonary embolism. Narrowing is noted in the right lower lobe branches consistent with the underlying mass lesion.  Scanning into the upper abdomen demonstrates evidence of a 3.8 cm mass in the left adrenal gland consistent with metastatic disease.  The bony structures show patchy lucencies in the thoracic vertebral bodies consistent with metastatic disease.  Review of the MIP images confirms the above findings.  IMPRESSION: Right lower lobe mass lesion with postobstructive changes and invasion into the bronchus intermedius as described consistent with a primary pulmonary neoplasm. There are changes consistent with left adrenal metastatic disease as well as bony metastatic disease.  Mild right middle lobe infiltrate.   Electronically Signed   By: MInez CatalinaM.D.   On: 08/07/2014 14:24     CBC  Recent Labs Lab 08/07/14 1300 08/08/14 0458  WBC 12.6* 11.6*  HGB 13.0 11.5*  HCT 38.9 34.7*  PLT 562* 483*  MCV 91.3 91.6  MCH 30.5 30.3  MCHC 33.4 33.1  RDW 13.0 12.9  LYMPHSABS 2.7  --   MONOABS 0.9  --   EOSABS 0.1  --   BASOSABS 0.1  --     Chemistries   Recent Labs Lab 08/07/14 1300 08/08/14 0458  NA 137 138  K 3.7 3.4*  CL 103 106  CO2 25 25  GLUCOSE 89 98  BUN 6 5*  CREATININE 0.55 0.46  CALCIUM 8.8* 8.5*  AST  --  16  ALT  --  14  ALKPHOS  --  145*  BILITOT  --  0.5   ------------------------------------------------------------------------------------------------------------------ estimated creatinine clearance is 62.5 mL/min (by C-G formula based on Cr of 0.46). ------------------------------------------------------------------------------------------------------------------ No results for input(s): HGBA1C in the last 72 hours. ------------------------------------------------------------------------------------------------------------------ No results for input(s): CHOL, HDL, LDLCALC, TRIG, CHOLHDL, LDLDIRECT in the last 72 hours. ------------------------------------------------------------------------------------------------------------------ No results for input(s): TSH, T4TOTAL, T3FREE, THYROIDAB in the last 72 hours.  Invalid input(s):  FREET3 ------------------------------------------------------------------------------------------------------------------ No results for input(s): VITAMINB12, FOLATE, FERRITIN, TIBC, IRON, RETICCTPCT in the last 72 hours.  Coagulation profile No results for input(s): INR, PROTIME in the last 168 hours.  No results for input(s): DDIMER in the last 72 hours.  Cardiac Enzymes No results for input(s): CKMB, TROPONINI, MYOGLOBIN in the last 168 hours.  Invalid input(s): CK ------------------------------------------------------------------------------------------------------------------ Invalid input(s): POCBNP   Time Spent in minutes  35   Noam Karaffa K M.D on 08/08/2014 at 10:51 AM  Between 7am to 7pm - Pager - [phone]  After 7pm go to [email or web address] - password [facility]  [facility] -  Office  [phone]

## 2014-08-09 ENCOUNTER — Inpatient Hospital Stay (HOSPITAL_COMMUNITY): Payer: No Typology Code available for payment source

## 2014-08-09 ENCOUNTER — Encounter (HOSPITAL_COMMUNITY)
Admission: EM | Disposition: A | Discharge status: Home / Self Care | Payer: Self-pay | Source: Home / Self Care | Attending: Internal Medicine

## 2014-08-09 DIAGNOSIS — J9601 Acute respiratory failure with hypoxia: Secondary | ICD-10-CM

## 2014-08-09 HISTORY — PX: VIDEO BRONCHOSCOPY: SHX5072

## 2014-08-09 LAB — BLOOD GAS, ARTERIAL
Acid-base deficit: 0.8 mmol/L (ref 0.0–2.0)
Bicarbonate: 23.2 mEq/L (ref 20.0–24.0)
DRAWN BY: 27022
FIO2: 1
MECHVT: 350 mL
O2 Saturation: 99.9 %
PEEP: 10 cmH2O
PO2 ART: 400 mmHg — AB (ref 80.0–100.0)
Patient temperature: 98.8
RATE: 18 resp/min
TCO2: 24.4 mmol/L (ref 0–100)
pCO2 arterial: 38 mmHg (ref 35.0–45.0)
pH, Arterial: 7.404 (ref 7.350–7.450)

## 2014-08-09 LAB — TRIGLYCERIDES: Triglycerides: 90 mg/dL (ref <150)

## 2014-08-09 LAB — POTASSIUM: POTASSIUM: 3.7 mmol/L (ref 3.5–5.1)

## 2014-08-09 LAB — MRSA PCR SCREENING: MRSA by PCR: NEGATIVE

## 2014-08-09 SURGERY — VIDEO BRONCHOSCOPY WITHOUT FLUORO
Anesthesia: Moderate Sedation | Laterality: Bilateral

## 2014-08-09 MED ORDER — FENTANYL CITRATE (PF) 100 MCG/2ML IJ SOLN
INTRAMUSCULAR | Status: AC
  Filled 2014-08-09: qty 4

## 2014-08-09 MED ORDER — BUTAMBEN-TETRACAINE-BENZOCAINE 2-2-14 % EX AERO
1.0000 | INHALATION_SPRAY | Freq: Once | CUTANEOUS | Status: DC
  Filled 2014-08-09: qty 20

## 2014-08-09 MED ORDER — FENTANYL CITRATE (PF) 100 MCG/2ML IJ SOLN
100.0000 ug | INTRAMUSCULAR | Status: DC | PRN
  Administered 2014-08-09: 100 ug via INTRAVENOUS
  Filled 2014-08-09 (×3): qty 2

## 2014-08-09 MED ORDER — ROCURONIUM BROMIDE 50 MG/5ML IV SOLN
50.0000 mg | Freq: Once | INTRAVENOUS | Status: AC
  Administered 2014-08-09: 50 mg via INTRAVENOUS

## 2014-08-09 MED ORDER — EPINEPHRINE HCL 0.1 MG/ML IJ SOSY
PREFILLED_SYRINGE | INTRAMUSCULAR | Status: DC | PRN
  Administered 2014-08-09: 0.1 mg via ENDOTRACHEOPULMONARY

## 2014-08-09 MED ORDER — PHENYLEPHRINE HCL 0.25 % NA SOLN
1.0000 | Freq: Four times a day (QID) | NASAL | Status: DC | PRN
  Filled 2014-08-09: qty 15

## 2014-08-09 MED ORDER — MIDAZOLAM HCL 5 MG/ML IJ SOLN
INTRAMUSCULAR | Status: AC
  Filled 2014-08-09: qty 1

## 2014-08-09 MED ORDER — FENTANYL CITRATE (PF) 100 MCG/2ML IJ SOLN
100.0000 ug | INTRAMUSCULAR | Status: DC | PRN
  Administered 2014-08-09: 100 ug via INTRAVENOUS
  Administered 2014-08-10: 50 ug via INTRAVENOUS
  Administered 2014-08-10: 100 ug via INTRAVENOUS
  Filled 2014-08-09: qty 2

## 2014-08-09 MED ORDER — FAMOTIDINE 40 MG/5ML PO SUSR
20.0000 mg | Freq: Two times a day (BID) | ORAL | Status: DC
  Administered 2014-08-10 – 2014-08-11 (×3): 20 mg
  Filled 2014-08-09 (×5): qty 2.5

## 2014-08-09 MED ORDER — MIDAZOLAM HCL 2 MG/2ML IJ SOLN: 2.0000 mg | INTRAMUSCULAR | Status: DC | PRN

## 2014-08-09 MED ORDER — MIDAZOLAM HCL 10 MG/2ML IJ SOLN
INTRAMUSCULAR | Status: DC | PRN
  Administered 2014-08-09: 2 mg via INTRAVENOUS
  Administered 2014-08-09: 1 mg via INTRAVENOUS
  Administered 2014-08-09: 2.5 mg via INTRAVENOUS
  Administered 2014-08-09: 1 mg via INTRAVENOUS

## 2014-08-09 MED ORDER — LIDOCAINE HCL (PF) 1 % IJ SOLN
INTRAMUSCULAR | Status: DC | PRN
  Administered 2014-08-09: 6 mL

## 2014-08-09 MED ORDER — PHENYLEPHRINE HCL 0.25 % NA SOLN
NASAL | Status: DC | PRN
  Administered 2014-08-09: 2 via NASAL

## 2014-08-09 MED ORDER — CETYLPYRIDINIUM CHLORIDE 0.05 % MT LIQD
7.0000 mL | Freq: Four times a day (QID) | OROMUCOSAL | Status: DC
  Administered 2014-08-10 – 2014-08-11 (×5): 7 mL via OROMUCOSAL

## 2014-08-09 MED ORDER — ETOMIDATE 2 MG/ML IV SOLN
10.0000 mg | Freq: Once | INTRAVENOUS | Status: AC
  Administered 2014-08-09: 10 mg via INTRAVENOUS

## 2014-08-09 MED ORDER — FENTANYL CITRATE (PF) 100 MCG/2ML IJ SOLN
INTRAMUSCULAR | Status: DC | PRN
  Administered 2014-08-09: 50 ug via INTRAVENOUS
  Administered 2014-08-09: 100 ug via INTRAVENOUS
  Administered 2014-08-09 (×2): 25 ug via INTRAVENOUS

## 2014-08-09 MED ORDER — MIDAZOLAM HCL 2 MG/2ML IJ SOLN
2.0000 mg | INTRAMUSCULAR | Status: DC | PRN
Start: 2014-08-09 — End: 2014-08-09
  Administered 2014-08-09: 2 mg via INTRAVENOUS

## 2014-08-09 MED ORDER — MIDAZOLAM HCL 5 MG/ML IJ SOLN
INTRAMUSCULAR | Status: AC
  Filled 2014-08-09: qty 2

## 2014-08-09 MED ORDER — PROPOFOL 1000 MG/100ML IV EMUL
0.0000 ug/kg/min | INTRAVENOUS | Status: DC
  Administered 2014-08-09: 5 ug/kg/min via INTRAVENOUS
  Administered 2014-08-10: 20 ug/kg/min via INTRAVENOUS
  Filled 2014-08-09 (×2): qty 100

## 2014-08-09 MED ORDER — FENTANYL CITRATE (PF) 100 MCG/2ML IJ SOLN: 100.0000 ug | INTRAMUSCULAR | Status: DC | PRN

## 2014-08-09 MED ORDER — LIDOCAINE HCL 2 % EX GEL
CUTANEOUS | Status: DC | PRN
  Administered 2014-08-09: 1

## 2014-08-09 MED ORDER — SODIUM CHLORIDE 0.9 % IV SOLN
INTRAVENOUS | Status: DC
  Administered 2014-08-09 – 2014-08-11 (×3): via INTRAVENOUS
  Administered 2014-08-11: 75 mL/h via INTRAVENOUS

## 2014-08-09 MED ORDER — LIDOCAINE HCL 2 % EX GEL
1.0000 "application " | Freq: Once | CUTANEOUS | Status: DC
  Filled 2014-08-09: qty 5

## 2014-08-09 MED ORDER — CHLORHEXIDINE GLUCONATE 0.12 % MT SOLN
15.0000 mL | Freq: Two times a day (BID) | OROMUCOSAL | Status: DC
  Administered 2014-08-09 – 2014-08-11 (×3): 15 mL via OROMUCOSAL
  Filled 2014-08-09 (×3): qty 15

## 2014-08-09 MED ORDER — BISACODYL 10 MG RE SUPP: 10.0000 mg | Freq: Every day | RECTAL | Status: DC | PRN

## 2014-08-09 MED ORDER — SODIUM CHLORIDE 0.9 % IV SOLN
INTRAVENOUS | Status: DC
  Administered 2014-08-09: 15:00:00 via INTRAVENOUS

## 2014-08-09 NOTE — Progress Notes (Signed)
   Name: Deborah Foster MRN: [id number] DOB: 16-Apr-1970    ADMISSION DATE:  08/07/2014 CONSULTATION DATE:  08/08/14 REFERRING MD :  TRH  CHIEF COMPLAINT:  Lung mass  BRIEF PATIENT DESCRIPTION: 44 yo F non-smoker presented with cough x 1 week, possibly hemoptysis. CXR suggested pneumonia but CT shows RLL mass with mets to left adrenal and thoracic vertebrae.  SIGNIFICANT EVENTS  7/25 bronchoscopy> large clot in the right lower lobe, significant bleeding with suctioning, bleeding controlled; intubated for airway protection, ICU monitoring.  STUDIES:  CT chest 7/23 > Right lower lobe mass lesion with postobstructive changes and invasion into the bronchus intermedius. changes consistent with left adrenal metastatic disease as well as bony metastatic disease. Mild right middle lobe infiltrate.  SUBJECTIVE: Bleeding afternoon with bronchoscopy, bleeding controlled, sedated, intubated  VITAL SIGNS: Temp:  [98.6 F (37 C)-98.8 F (37.1 C)] 98.8 F (37.1 C) (07/25 1429) Pulse Rate:  [85-132] 91 (07/25 1545) Resp:  [14-34] 18 (07/25 1545) BP: (98-140)/(46-96) 118/59 mmHg (07/25 1545) SpO2:  [95 %-100 %] 100 % (07/25 1545) Weight:  [50.349 kg (111 lb)] 50.349 kg (111 lb) (07/25 1429)  PHYSICAL EXAMINATION:  General: sedated on vent HENT: NCAT EOMi PULM: CTA B, vent supported breaths CV: Tachy, regular GI: BS+, soft, nontender Ext: warm, no edema Neuro: sedated, paralyzed on vent   Recent Labs Lab 08/07/14 1300 08/08/14 0458 08/09/14 0448  NA 137 138  --   K 3.7 3.4* 3.7  CL 103 106  --   CO2 25 25  --   BUN 6 5*  --   CREATININE 0.55 0.46  --   GLUCOSE 89 98  --     Recent Labs Lab 08/07/14 1300 08/08/14 0458  HGB 13.0 11.5*  HCT 38.9 34.7*  WBC 12.6* 11.6*  PLT 562* 483*   No results found.  ASSESSMENT / PLAN: Pulmonary: A: Massive hemoptysis: bleeding currently controlled, intubated for airway protection, stabilization Right lower lobe mass P: Maintain on vent  tonight, then plan bronchoscopy in AM If re-bleeds, will need IR embolization PAD sedation protocol overnight F/u cytology from bronchoscopy  Cardiovascular A: Sinus tachycardia post procedure P:  Tele monitoring  Renal A: No acute issues P:  Monitor BMET and UOP Replace electrolytes as needed  GI A: NO acute issues P:  NPO for now  Endo:  A: no acute issues P: Monitor glucose  Infectious: A: Aspiration pneumonitis? P: 7/25 bronch AFB > 7/25 bronch Fungal > 7/25 bronch culture >  7/25 Unasyn >   Neuro A: Sedation needs on vent P:  RASS goal -2 Propofol gtt + prn fentanyl  My cc time 60 minutes  Roselie Awkward, MD [place] PCCM Pager: [phone] Cell: [phone] After 3pm or if no response, call [phone]   08/09/2014 3:50 PM

## 2014-08-09 NOTE — Procedures (Signed)
Intubation Procedure Note Deborah Foster [id number] Dec 19, 1970  Procedure: Intubation Indications: Airway protection and maintenance  Procedure Details Consent: Unable to obtain consent because of emergent medical necessity. Time Out: Verified patient identification, verified procedure, site/side was marked, verified correct patient position, special equipment/implants available, medications/allergies/relevent history reviewed, required imaging and test results available.  Performed  Maximum sterile technique was used including gloves, hand hygiene and mask.  MAC    Evaluation Hemodynamic Status: BP stable throughout; O2 sats: stable throughout Patient's Current Condition: stable Complications: No apparent complications Patient did tolerate procedure well. Chest X-ray ordered to verify placement.  CXR: pending.   Deborah Foster 08/09/2014

## 2014-08-09 NOTE — Progress Notes (Signed)
Name: Deborah Foster MRN: [id number] DOB: March 29, 1970    ADMISSION DATE:  08/07/2014 CONSULTATION DATE:  08/08/14 REFERRING MD :  FPTS  CHIEF COMPLAINT:  Lung mass  BRIEF PATIENT DESCRIPTION: 44 yo F non-smoker presented with cough x 1 week, possibly hemoptysis. CXR suggested pneumonia but CT shows RLL mass with mets to left adrenal and thoracic vertebrae.  SIGNIFICANT EVENTS    STUDIES:  CT chest 7/23 > Right lower lobe mass lesion with postobstructive changes and invasion into the bronchus intermedius. changes consistent with left adrenal metastatic disease as well as bony metastatic disease. Mild right middle lobe infiltrate.  SUBJECTIVE: small amount of hemoptysis early this AM x 1 episode, no complaints otherwise  VITAL SIGNS: Temp:  [98.5 F (36.9 C)-98.6 F (37 C)] 98.6 F (37 C) (07/25 0513) Pulse Rate:  [85-101] 85 (07/25 0513) Resp:  [16-20] 16 (07/25 0513) BP: (93-105)/(46-59) 105/59 mmHg (07/25 0513) SpO2:  [96 %-100 %] 96 % (07/25 0513)  PHYSICAL EXAMINATION:  General:  Female of normal body habitus in NAD  Neuro:  Non-focal, oriented HEENT:  PERRL, speech clear, mucosa normal, gross hearing and vision intact Cardiovascular:  RRR, no m/g/r Lungs: diminished R base, no rub, rhonchi, wheeze or cough Abdomen:  Soft, BS present Musculoskeletal:  Normal muscle tone, moving well Skin: Grossly intact   Recent Labs Lab 08/07/14 1300 08/08/14 0458 08/09/14 0448  NA 137 138  --   K 3.7 3.4* 3.7  CL 103 106  --   CO2 25 25  --   BUN 6 5*  --   CREATININE 0.55 0.46  --   GLUCOSE 89 98  --     Recent Labs Lab 08/07/14 1300 08/08/14 0458  HGB 13.0 11.5*  HCT 38.9 34.7*  WBC 12.6* 11.6*  PLT 562* 483*   Ct Angio Chest Pe W/cm &/or Wo Cm  08/07/2014   CLINICAL DATA:  Right middle lobe pneumonia on chest x-ray, recent hemoptysis  EXAM: CT ANGIOGRAPHY CHEST WITH CONTRAST  TECHNIQUE: Multidetector CT imaging of the chest was performed using the standard protocol  during bolus administration of intravenous contrast. Multiplanar CT image reconstructions and MIPs were obtained to evaluate the vascular anatomy.  CONTRAST:  70mL OMNIPAQUE IOHEXOL 350 MG/ML SOLN  COMPARISON:  Chest x-ray from the previous day  FINDINGS: Left lung is well aerated without evidence focal infiltrate or sizable effusion. The right lung is also well aerated and demonstrates some mild infiltrate within the right middle lobe. In the right lower lobe emanating from the hilum inferiorly there is a 4.4 by 3.0 by 3.6 cm peripherally spiculated mass lesion consistent with a primary pulmonary neoplasm. Distal to this there are changes of postobstructive infiltrate. There is occlusion of the lower lobe bronchus as well as significant attenuation of the lower lobe pulmonary artery. Filling defect is identified within the bronchus intermedius consistent with local in growth. Right hilar adenopathy measuring 14 mm in short axis is seen.  The thoracic aorta in its branches are within normal limits. Pulmonary artery demonstrates a normal branching pattern without evidence of pulmonary embolism. Narrowing is noted in the right lower lobe branches consistent with the underlying mass lesion.  Scanning into the upper abdomen demonstrates evidence of a 3.8 cm mass in the left adrenal gland consistent with metastatic disease. The bony structures show patchy lucencies in the thoracic vertebral bodies consistent with metastatic disease.  Review of the MIP images confirms the above findings.  IMPRESSION: Right lower lobe mass  lesion with postobstructive changes and invasion into the bronchus intermedius as described consistent with a primary pulmonary neoplasm. There are changes consistent with left adrenal metastatic disease as well as bony metastatic disease.  Mild right middle lobe infiltrate.   Electronically Signed   By: Inez Catalina M.D.   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 There is occlusion RLL bronchus and bronchoscopy is likely to yield diagnosis. I discussed the procedure, usual risks and purpose.  Post-obstructive pneumonitis   - Will need FOB for tissue sampling - Would prefer to be worked up as outpatient - Will discuss with attending physician - Continue empiric Unasyn   Georgann Housekeeper, AGACNP-BC [facility] Pager [phone] or [phone]  08/09/2014 11:34 AM

## 2014-08-09 NOTE — Plan of Care (Signed)
Problem: Problem: Respiratory Progression Goal: ABLE TO EXTUBATE Outcome: Not Progressing To stay intubated overnight for bronchosopy tomorrow

## 2014-08-09 NOTE — Progress Notes (Signed)
Utilization review completed.  

## 2014-08-09 NOTE — Progress Notes (Signed)
Video Bronchoscopy performed  Intervention bronchial washing  Patient with moderate bleeding during procedure.  MD ordered for intubation.  RSI kit obtained from pharmacy.  Noe Gens, NP gave medication for intubation.  Will transfer to ICU when bed is available.

## 2014-08-09 NOTE — Brief Op Note (Signed)
PCCM Bronchoscopy Procedure Note  The patient was informed of the risks (including but not limited to bleeding, infection, respiratory failure, lung injury, tooth/oral injury) and benefits of the procedure and gave consent, see chart.  Indication: Endobronchial mass  Post procedure diagnosis: Bleeding from RLL  Location: [place] [facility]  Condition pre procedure: Stable  Medications for procedure: Versed '5mg'$  IV, Fentanyl 112mg IV  Procedure description: The bronchoscope was introduced through the nose and passed to the bilateral lungs to the level of the subsegmental bronchi throughout the tracheobronchial tree.  Airway exam revealed normal appearing and functioning larynx. The trachea was normal in appearance and the carina was sharp.  The left tracheobronchial tree was normal in appearance without airway lesion or mass.  I then focused attention on the right tracheobronchial tree.  The right upper lobe was normal in appearance.  There was a large clot in the bronchus intermedius.  This was partially occluding the airway.  The scope was passed beyond the clot and could be passed into the right middle lobe.  The lateral segment of the right middle lobe appeared extrinsically compressed.  The scope was then moved into the airway and the clot was seen to becoming from the right lower lobe.  The clot was suctioned and the bronchus intermedius was clear.  However once the clot was removed from the orifice of the right lower lobe there was significant bleeding from the right lower lobe.  The scope was used to maintain wedge and suction at this time.  Epinephrine was administered topically through the scope and I was able to conntrol of bleeding.  Approximately 50 to 75 cc of blood was suctioned from the airway.    At this time the patient was breathing spontaneously with normal oxygenation and vital signs, but given the significant blood loss and concern for recurrent bleeding I elected  to intubate her.  See Dr. YPura Spicenote.  After the intubation I again passed the bronchoscope into the airway.  There was not active bleeding.  The right lower lobe subsegments were visible but bloody so it was difficult to see an underlying mass.  Procedures performed:  1) Therapeutic aspiration of the airways  Specimens sent:  BAL > culture (bacterial, fungal, afb) and cytology  Condition post procedure: Intubated, on mechanical ventilation, on vent  EBL: [id number] Complications: Bleeding controlled with epinephrine  Plan: Transfer to the ICU for monitoring overnight. Plan repeat bronchoscopy in the AM and then extubation.  BRoselie Awkward MD [place] Pager: [phone]: [phone] 3pm or if no response, call [phone]

## 2014-08-09 NOTE — Progress Notes (Signed)
Patient Demographics:    Deborah Foster, is a 44 y.o. female, DOB - 1970-04-19, QRF:[id number]  Admit date - 08/07/2014   Admitting Physician Oswald Hillock, MD  Outpatient Primary MD for the patient is [name], MD  LOS - 2   Chief Complaint  Patient presents with  . Cough        Subjective:    Deborah Foster today has, No headache, No chest pain, No abdominal pain - No Nausea, No new weakness tingling or numbness, mild  Cough - but no SOB.  Small amount of hemoptysis night of 08/08/2014.   Assessment  & Plan :     1. 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 PCCM to see today.   2. Possible postobstructive pneumonia. Unasyn for now.    Code Status : Full  Family Communication  : none present  Disposition Plan  : Home 1-2 days  Consults  :  PCCM  Procedures  :   DVT Prophylaxis  :    SCDs   Lab Results  Component Value Date   PLT 483* 08/08/2014    Inpatient Medications  Scheduled Meds: . ampicillin-sulbactam (UNASYN) IV  1.5 g Intravenous Q6H   Continuous Infusions:   PRN Meds:.acetaminophen **OR** acetaminophen, albuterol, ondansetron **OR** ondansetron (ZOFRAN) IV  Antibiotics  :     Anti-infectives    Start     Dose/Rate Route Frequency Ordered Stop   08/08/14 1600  azithromycin (ZITHROMAX) 500 mg in dextrose 5 % 250 mL IVPB  Status:  Discontinued     500 mg 250 mL/hr over 60 Minutes Intravenous Every 24 hours 08/07/14 1639 08/08/14 1052   08/08/14 1500  cefTRIAXone (ROCEPHIN) 1 g in dextrose 5 % 50 mL IVPB - Premix  Status:  Discontinued     1 g 100 mL/hr over 30 Minutes Intravenous Every 24 hours 08/07/14 1654 08/08/14 1052   08/08/14 1200  ampicillin-sulbactam (UNASYN) 1.5 g in sodium chloride 0.9 % 50 mL IVPB     1.5 g 100 mL/hr over 30 Minutes Intravenous Every 6 hours 08/08/14 1124     08/07/14 1515  cefTRIAXone (ROCEPHIN) 1 g in dextrose 5 % 50 mL IVPB     1 g 100 mL/hr over 30 Minutes Intravenous  Once 08/07/14 1501 08/07/14 1549   08/07/14 1515  azithromycin (ZITHROMAX) 500 mg in dextrose 5 % 250 mL IVPB     500 mg 250 mL/hr over 60 Minutes Intravenous  Once 08/07/14 1501 08/07/14 1721        Objective:   Filed Vitals:   08/08/14 0546 08/08/14 1425 08/08/14 2101 08/09/14 0513  BP: 101/58 93/48 103/46 105/59  Pulse: 95 101 94 85  Temp: 98.4 F (36.9 C) 98.5 F (36.9 C) 98.6 F (37 C) 98.6 F (37 C)  TempSrc: Oral Oral Oral Oral  Resp: $Remo'15 18 20 16  'RnDQF$ Height:      Weight:      SpO2: 100% 100% 98% 96%    Wt Readings from Last 3 Encounters:  08/07/14 50.349 kg (111 lb)     Intake/Output Summary (Last 24 hours)  at 08/09/14 1050 Last data filed at 08/09/14 0900  Gross per 24 hour  Intake    600 ml  Output      0 ml  Net    600 ml     Physical Exam  Awake Alert, Oriented X 3, No new F.N deficits, Normal affect [place].AT,PERRAL Supple Neck,No JVD, No cervical lymphadenopathy appriciated.  Symmetrical Chest wall movement, Good air movement bilaterally, CTAB RRR,No Gallops,Rubs or new Murmurs, No Parasternal Heave +ve B.Sounds, Abd Soft, No tenderness, No organomegaly appriciated, No rebound - guarding or rigidity. No Cyanosis, Clubbing or edema, No new Rash or bruise       Data Review:   Micro Results Recent Results (from the past 240 hour(s))  Culture, blood (routine x 2)     Status: None (Preliminary result)   Collection Time: 08/07/14  5:10 PM  Result Value Ref Range Status   Specimen Description BLOOD RIGHT HAND  Final   Special Requests BOTTLES DRAWN AEROBIC AND ANAEROBIC 10CC  Final   Culture NO GROWTH < 24 HOURS  Final   Report Status PENDING  Incomplete  Culture, blood  (routine x 2)     Status: None (Preliminary result)   Collection Time: 08/07/14  5:13 PM  Result Value Ref Range Status   Specimen Description BLOOD LEFT WRIST  Final   Special Requests BOTTLES DRAWN AEROBIC AND ANAEROBIC 10CC  Final   Culture NO GROWTH < 24 HOURS  Final   Report Status PENDING  Incomplete    Radiology Reports Dg Chest 2 View  08/06/2014   CLINICAL DATA:  Hemoptysis and chest pain  EXAM: CHEST  2 VIEW  COMPARISON:  None.  FINDINGS: There is focal airspace consolidation in the right middle lobe. Lungs elsewhere clear. Heart size and pulmonary vascularity are normal. No adenopathy. No bone lesions.  IMPRESSION: Right middle lobe pneumonia. Lungs otherwise clear. Followup PA and lateral chest radiographs recommended in 3-4 weeks following trial of antibiotic therapy to ensure resolution and exclude underlying malignancy.  These results will be called to the ordering clinician or representative by the Radiologist Assistant, and communication documented in the PACS or zVision Dashboard.   Electronically Signed   By: Lowella Grip III M.D.   On: 08/06/2014 17:22   Ct Angio Chest Pe W/cm &/or Wo Cm  08/07/2014   CLINICAL DATA:  Right middle lobe pneumonia on chest x-ray, recent hemoptysis  EXAM: CT ANGIOGRAPHY CHEST WITH CONTRAST  TECHNIQUE: Multidetector CT imaging of the chest was performed using the standard protocol during bolus administration of intravenous contrast. Multiplanar CT image reconstructions and MIPs were obtained to evaluate the vascular anatomy.  CONTRAST:  74mL OMNIPAQUE IOHEXOL 350 MG/ML SOLN  COMPARISON:  Chest x-ray from the previous day  FINDINGS: Left lung is well aerated without evidence focal infiltrate or sizable effusion. The right lung is also well aerated and demonstrates some mild infiltrate within the right middle lobe. In the right lower lobe emanating from the hilum inferiorly there is a 4.4 by 3.0 by 3.6 cm peripherally spiculated mass lesion consistent  with a primary pulmonary neoplasm. Distal to this there are changes of postobstructive infiltrate. There is occlusion of the lower lobe bronchus as well as significant attenuation of the lower lobe pulmonary artery. Filling defect is identified within the bronchus intermedius consistent with local in growth. Right hilar adenopathy measuring 14 mm in short axis is seen.  The thoracic aorta in its branches are within normal limits. Pulmonary artery demonstrates a  normal branching pattern without evidence of pulmonary embolism. Narrowing is noted in the right lower lobe branches consistent with the underlying mass lesion.  Scanning into the upper abdomen demonstrates evidence of a 3.8 cm mass in the left adrenal gland consistent with metastatic disease. The bony structures show patchy lucencies in the thoracic vertebral bodies consistent with metastatic disease.  Review of the MIP images confirms the above findings.  IMPRESSION: Right lower lobe mass lesion with postobstructive changes and invasion into the bronchus intermedius as described consistent with a primary pulmonary neoplasm. There are changes consistent with left adrenal metastatic disease as well as bony metastatic disease.  Mild right middle lobe infiltrate.   Electronically Signed   By: Inez Catalina M.D.   On: 08/07/2014 14:24     CBC  Recent Labs Lab 08/07/14 1300 08/08/14 0458  WBC 12.6* 11.6*  HGB 13.0 11.5*  HCT 38.9 34.7*  PLT 562* 483*  MCV 91.3 91.6  MCH 30.5 30.3  MCHC 33.4 33.1  RDW 13.0 12.9  LYMPHSABS 2.7  --   MONOABS 0.9  --   EOSABS 0.1  --   BASOSABS 0.1  --     Chemistries   Recent Labs Lab 08/07/14 1300 08/08/14 0458 08/09/14 0448  NA 137 138  --   K 3.7 3.4* 3.7  CL 103 106  --   CO2 25 25  --   GLUCOSE 89 98  --   BUN 6 5*  --   CREATININE 0.55 0.46  --   CALCIUM 8.8* 8.5*  --   AST  --  16  --   ALT  --  14  --   ALKPHOS  --  145*  --   BILITOT  --  0.5  --     ------------------------------------------------------------------------------------------------------------------ estimated creatinine clearance is 62.5 mL/min (by C-G formula based on Cr of 0.46). ------------------------------------------------------------------------------------------------------------------ No results for input(s): HGBA1C in the last 72 hours. ------------------------------------------------------------------------------------------------------------------ No results for input(s): CHOL, HDL, LDLCALC, TRIG, CHOLHDL, LDLDIRECT in the last 72 hours. ------------------------------------------------------------------------------------------------------------------ No results for input(s): TSH, T4TOTAL, T3FREE, THYROIDAB in the last 72 hours.  Invalid input(s): FREET3 ------------------------------------------------------------------------------------------------------------------ No results for input(s): VITAMINB12, FOLATE, FERRITIN, TIBC, IRON, RETICCTPCT in the last 72 hours.  Coagulation profile No results for input(s): INR, PROTIME in the last 168 hours.  No results for input(s): DDIMER in the last 72 hours.  Cardiac Enzymes No results for input(s): CKMB, TROPONINI, MYOGLOBIN in the last 168 hours.  Invalid input(s): CK ------------------------------------------------------------------------------------------------------------------ Invalid input(s): POCBNP   Time Spent in minutes  35   Naveyah Iacovelli K M.D on 08/09/2014 at 10:50 AM  Between 7am to 7pm - Pager - [phone]  After 7pm go to [email or web address] - password [facility]  [facility] -  Office  [phone]

## 2014-08-09 NOTE — H&P (Signed)
  [facility] PCCM  HPI: 44 y/o female from [place] with a large R lung mass of uncertain etiology.  Appears to have metastatic malignancy.  Had been coughing up blood.  Denies tobacco use.  History reviewed. No pertinent past medical history.   History reviewed. No pertinent family history.   History   Social History  . Marital Status: Unknown    Spouse Name: N/A  . Number of Children: N/A  . Years of Education: N/A   Occupational History  . Not on file.   Social History Main Topics  . Smoking status: Never Smoker   . Smokeless tobacco: Not on file  . Alcohol Use: No  . Drug Use: No  . Sexual Activity: Not on file   Other Topics Concern  . Not on file   Social History Narrative  . No narrative on file     No Known Allergies   '@encmedstart'$ @  Filed Vitals:   08/09/14 1431 08/09/14 1435 08/09/14 1440 08/09/14 1445  BP:  129/75 131/76 129/68  Pulse:  100 107 108  Temp:      TempSrc:      Resp:  '19 25 23  '$ Height:      Weight:      SpO2: 96% 97% 97% 98%   RA  Gen: well appearing HENT: OP clear, TM's clear, neck supple PULM: CTA B, normal percussion CV: RRR, no mgr, trace edema GI: BS+, soft, nontender Derm: no cyanosis or rash Psyche: normal mood and affect  CT images reviewed> mass starts in bronchus intermedius, extends to RML primarily;  Appears to have metastatic disease  Impression/Plan: Large lung mass with likely metastatic malignancy> plan bronchoscopy with biopsy today  Roselie Awkward, MD [place] [facility] Pager: [phone] Cell: [phone] After 3pm or if no response, call [phone]

## 2014-08-09 NOTE — Progress Notes (Signed)
Pt notified RN of hemopytosis episode. Small amount of blood noted on paper towel. On-call NP notified. Will continue to monitor.

## 2014-08-10 ENCOUNTER — Inpatient Hospital Stay (HOSPITAL_COMMUNITY): Payer: No Typology Code available for payment source

## 2014-08-10 ENCOUNTER — Encounter (HOSPITAL_COMMUNITY): Payer: Self-pay | Admitting: Radiology

## 2014-08-10 LAB — BASIC METABOLIC PANEL
ANION GAP: 9 (ref 5–15)
CO2: 23 mmol/L (ref 22–32)
CREATININE: 0.46 mg/dL (ref 0.44–1.00)
Calcium: 8.3 mg/dL — ABNORMAL LOW (ref 8.9–10.3)
Chloride: 103 mmol/L (ref 101–111)
GFR calc Af Amer: >60 mL/min (ref >60)
Glucose, Bld: 93 mg/dL (ref 65–99)
Potassium: 3.8 mmol/L (ref 3.5–5.1)
Sodium: 135 mmol/L (ref 135–145)

## 2014-08-10 LAB — CBC
HEMATOCRIT: 33.4 % — AB (ref 36.0–46.0)
HEMOGLOBIN: 11.2 g/dL — AB (ref 12.0–15.0)
MCH: 31.1 pg (ref 26.0–34.0)
MCHC: 33.5 g/dL (ref 30.0–36.0)
MCV: 92.8 fL (ref 78.0–100.0)
Platelets: 444 10*3/uL — ABNORMAL HIGH (ref 150–400)
RBC: 3.6 MIL/uL — AB (ref 3.87–5.11)
RDW: 13.1 % (ref 11.5–15.5)
WBC: 16.8 10*3/uL — AB (ref 4.0–10.5)

## 2014-08-10 LAB — PROTIME-INR
INR: 1.17 (ref 0.00–1.49)
Prothrombin Time: 15.1 seconds (ref 11.6–15.2)

## 2014-08-10 MED ORDER — BACID PO TABS
2.0000 | ORAL_TABLET | Freq: Three times a day (TID) | ORAL | Status: DC
  Administered 2014-08-10 – 2014-08-17 (×20): 2 via ORAL
  Filled 2014-08-10 (×27): qty 2

## 2014-08-10 MED ORDER — FENTANYL CITRATE (PF) 100 MCG/2ML IJ SOLN
INTRAMUSCULAR | Status: AC | PRN
  Administered 2014-08-10 (×2): 50 ug via INTRAVENOUS

## 2014-08-10 MED ORDER — IOHEXOL 300 MG/ML  SOLN
50.0000 mL | Freq: Once | INTRAMUSCULAR | Status: AC | PRN
  Administered 2014-08-10: 50 mL via INTRAVENOUS

## 2014-08-10 MED ORDER — FENTANYL CITRATE (PF) 100 MCG/2ML IJ SOLN
INTRAMUSCULAR | Status: AC
  Filled 2014-08-10: qty 2

## 2014-08-10 MED ORDER — MIDAZOLAM HCL 2 MG/2ML IJ SOLN
INTRAMUSCULAR | Status: AC | PRN
  Administered 2014-08-10 (×3): 1 mg via INTRAVENOUS

## 2014-08-10 MED ORDER — MIDAZOLAM HCL 2 MG/2ML IJ SOLN
INTRAMUSCULAR | Status: AC
  Filled 2014-08-10: qty 4

## 2014-08-10 MED ORDER — LIDOCAINE HCL 1 % IJ SOLN
INTRAMUSCULAR | Status: AC
  Filled 2014-08-10: qty 20

## 2014-08-10 NOTE — Procedures (Signed)
Post bronchial artery embolization.   No immediate post procedural complications.   Keep right leg straight for 4 hrs.    SignedSandi Mariscal Pager: [phone] 08/10/2014, 1:27 PM

## 2014-08-10 NOTE — Sedation Documentation (Signed)
5French exoseal to R groin intact, gauze and tegaderm over

## 2014-08-10 NOTE — H&P (Signed)
Chief Complaint: Patient was seen in consultation today for hemoptysis  Chief Complaint  Patient presents with  . Cough   at the request of PCCM- Dr. Lake Bells  Referring Physician(s): PCCM- Dr. Lake Bells  History of Present Illness: DOTSIE Deborah Foster is a 44 y.o. female who presented to [facility] ED on 08/07/14 after seeing her PCP for cough and hemoptysis x 1 week. Imaging revealed possible pneumonia and she was sent to ED, further imaging revealed RLL mass and adrenal and bony metastatic disease. She denies any history of tobacco use. She denies any recent weight loss or history of similar symptoms. PCCM has seen the patient and she has underwent a bronchoscopy. She was found to have continued bleeding today and IR has been consulted for bronchial artery embolization. Her brother is with her today and provides history as well as written response from the patient.   History reviewed. No pertinent past medical history.  History reviewed. No pertinent past surgical history.  Allergies: Review of patient's allergies indicates no known allergies.  Medications: Prior to Admission medications   Not on File     History reviewed. No pertinent family history.  History   Social History  . Marital Status: Unknown    Spouse Name: N/A  . Number of Children: N/A  . Years of Education: N/A   Social History Main Topics  . Smoking status: Never Smoker   . Smokeless tobacco: Not on file  . Alcohol Use: No  . Drug Use: No  . Sexual Activity: Not on file   Other Topics Concern  . None   Social History Narrative   Review of Systems: A 12 point ROS discussed and pertinent positives are indicated in the HPI above.  All other systems are negative.  Review of Systems  Vital Signs: BP 99/45 mmHg  Pulse 91  Temp(Src) 98.3 F (36.8 C) (Axillary)  Resp 20  Ht '4\' 11"'$  (1.499 m)  Wt 111 lb (50.349 kg)  BMI 22.41 kg/m2  SpO2 99%  LMP 06/27/2014  Physical Exam General: Awake, responds to  questions with written response, on Vent Heart: RRR without M/G/R Lungs: CTA B/L Abd: Soft, NT, ND Ext: no edema b/l, DP 1+ b/l, warm  Mallampati Score:  MD Evaluation Airway:  (On vent) Heart: WNL Abdomen: WNL Chest/ Lungs: WNL ASA  Classification: 3 Mallampati/Airway Score:  (On vent)  Imaging: Dg Chest 2 View  08/06/2014   CLINICAL DATA:  Hemoptysis and chest pain  EXAM: CHEST  2 VIEW  COMPARISON:  None.  FINDINGS: There is focal airspace consolidation in the right middle lobe. Lungs elsewhere clear. Heart size and pulmonary vascularity are normal. No adenopathy. No bone lesions.  IMPRESSION: Right middle lobe pneumonia. Lungs otherwise clear. Followup PA and lateral chest radiographs recommended in 3-4 weeks following trial of antibiotic therapy to ensure resolution and exclude underlying malignancy.  These results will be called to the ordering clinician or representative by the Radiologist Assistant, and communication documented in the PACS or zVision Dashboard.   Electronically Signed   By: Lowella Grip III M.D.   On: 08/06/2014 17:22   Ct Angio Chest Pe W/cm &/or Wo Cm  08/07/2014   CLINICAL DATA:  Right middle lobe pneumonia on chest x-ray, recent hemoptysis  EXAM: CT ANGIOGRAPHY CHEST WITH CONTRAST  TECHNIQUE: Multidetector CT imaging of the chest was performed using the standard protocol during bolus administration of intravenous contrast. Multiplanar CT image reconstructions and MIPs were obtained to evaluate the  vascular anatomy.  CONTRAST:  45m OMNIPAQUE IOHEXOL 350 MG/ML SOLN  COMPARISON:  Chest x-ray from the previous day  FINDINGS: Left lung is well aerated without evidence focal infiltrate or sizable effusion. The right lung is also well aerated and demonstrates some mild infiltrate within the right middle lobe. In the right lower lobe emanating from the hilum inferiorly there is a 4.4 by 3.0 by 3.6 cm peripherally spiculated mass lesion consistent with a primary  pulmonary neoplasm. Distal to this there are changes of postobstructive infiltrate. There is occlusion of the lower lobe bronchus as well as significant attenuation of the lower lobe pulmonary artery. Filling defect is identified within the bronchus intermedius consistent with local in growth. Right hilar adenopathy measuring 14 mm in short axis is seen.  The thoracic aorta in its branches are within normal limits. Pulmonary artery demonstrates a normal branching pattern without evidence of pulmonary embolism. Narrowing is noted in the right lower lobe branches consistent with the underlying mass lesion.  Scanning into the upper abdomen demonstrates evidence of a 3.8 cm mass in the left adrenal gland consistent with metastatic disease. The bony structures show patchy lucencies in the thoracic vertebral bodies consistent with metastatic disease.  Review of the MIP images confirms the above findings.  IMPRESSION: Right lower lobe mass lesion with postobstructive changes and invasion into the bronchus intermedius as described consistent with a primary pulmonary neoplasm. There are changes consistent with left adrenal metastatic disease as well as bony metastatic disease.  Mild right middle lobe infiltrate.   Electronically Signed   By: MInez CatalinaM.D.   On: 08/07/2014 14:24   Dg Chest Port 1 View  08/10/2014   CLINICAL DATA:  Hemoptysis  EXAM: PORTABLE CHEST - 1 VIEW  COMPARISON:  08/09/2014  FINDINGS: An endotracheal tube and nasogastric catheter are noted in satisfactory position. The cardiac shadow is within normal limits. The lungs are well aerated bilaterally. Right perihilar mass lesion is again identified similar to that noted on previous chest x-ray and CT. No pneumothorax is identified.  IMPRESSION: Stable right perihilar mass lesion.  Tubes and lines as described.   Electronically Signed   By: MInez CatalinaM.D.   On: 08/10/2014 08:13   Dg Chest Port 1 View  08/09/2014   CLINICAL DATA:  Intubation,  status post bronchoscopy  EXAM: PORTABLE CHEST - 1 VIEW  COMPARISON:  08/06/2014  FINDINGS: Right hilar/right lower lobe mass reidentified. Endotracheal tube is appropriately positioned. Heart size upper limits of normal. No pneumothorax. No pleural effusion. Curvilinear left lower lobe atelectasis or scarring.  IMPRESSION: No pneumothorax after bronchoscopy.   Electronically Signed   By: GConchita ParisM.D.   On: 08/09/2014 16:27    Labs:  CBC:  Recent Labs  08/07/14 1300 08/08/14 0458 08/10/14 0254  WBC 12.6* 11.6* 16.8*  HGB 13.0 11.5* 11.2*  HCT 38.9 34.7* 33.4*  PLT 562* 483* 444*    COAGS: No results for input(s): INR, APTT in the last 8760 hours.  BMP:  Recent Labs  08/07/14 1300 08/08/14 0458 08/09/14 0448 08/10/14 0254  NA 137 138  --  135  K 3.7 3.4* 3.7 3.8  CL 103 106  --  103  CO2 25 25  --  23  GLUCOSE 89 98  --  93  BUN 6 5*  --  <5*  CALCIUM 8.8* 8.5*  --  8.3*  CREATININE 0.55 0.46  --  0.46  GFRNONAA >60 >60  --  >60  GFRAA >60 >60  --  >60    LIVER FUNCTION TESTS:  Recent Labs  08/08/14 0458  BILITOT 0.5  AST 16  ALT 14  ALKPHOS 145*  PROT 6.1*  ALBUMIN 2.7*    Assessment and Plan: Cough x 1 week Hemoptysis- On vent RLL mass with metastatic disease- left adrenal mass and bony metastatic disease seen- non smoker S/p bronchoscopy 7/25, 7/26-bleeding noted, F/U cytology  Request for IR consult, will proceed with image guided bronchial artery embolization today The patient has been NPO, no blood thinners taken, labs and vitals have been reviewed. Risks and Benefits discussed with the patient including, but not limited to bleeding, infection, vascular injury or contrast induced renal failure. All of the patient's questions were answered, patient is agreeable to proceed. Consent signed and in chart.    Thank you for this interesting consult.  I greatly enjoyed meeting Kortne T Cloyd and look forward to participating in their care.  A copy  of this report was sent to the requesting provider on this date.  SignedHedy Jacob 08/10/2014, 11:01 AM   I spent a total of 40 Minutes in face to face in clinical consultation, greater than 50% of which was counseling/coordinating care for hemoptysis.

## 2014-08-10 NOTE — Procedures (Signed)
Extubation Procedure Note  Patient Details:   Name: Deborah Foster DOB: 1970/09/14 MRN: [id number]   Airway Documentation: Patient was alert and oriented, following all commands to squeeze fingers, wiggle toes, stick out tongue, lift head off pillow, writing noes.  Checked for cuff leak prior to extubation and she did have one.  Extubated and removed OG tube at same time to 2lpm  st 100%.  Pt able to cough strongly and talk .  Hr 00, RR 20 sat 100%.  Will continue to monitor pt.    Evaluation  O2 sats: stable throughout Complications: No apparent complications Patient did tolerate procedure well. Bilateral Breath Sounds: Rhonchi Suctioning: Oral, Airway Yes  Ned Grace 08/10/2014, 5:42 PM

## 2014-08-10 NOTE — Progress Notes (Signed)
Initial Nutrition Assessment  DOCUMENTATION CODES:   Not applicable  INTERVENTION:    If TF started, recommend Vital AF 1.2 formula -- initiate at 15 ml/hr and increase by 10 ml every 4 hours to goal rate of 45 ml/hr  TF regimen to provide 1296 kcals, 81 gm protein, 876 ml of free water  NUTRITION DIAGNOSIS:   Inadequate oral intake related to inability to eat as evidenced by NPO status  GOAL:   Patient will meet greater than or equal to 90% of their needs  MONITOR:   Vent status, Labs, Weight trends, I & O's  REASON FOR ASSESSMENT:   Ventilator  ASSESSMENT:   [name] non-smoker presented with cough x 1 week, possibly hemoptysis. CXR suggested pneumonia but CT shows RLL mass with mets to left adrenal and thoracic vertebrae.  Patient s/p procedure 7/25: BRONCHOSCOPY  S/p bronchial artery embolization of RLL per IR.  Patient is currently intubated on ventilator support -- OGT in place MV: 8.1 L/min Temp (24hrs), Avg:98.5 F (36.9 C), Min:98.3 F (36.8 C), Max:98.8 F (37.1 C)   RD unable to complete Nutrition Focused Physical Exam at this time.  Diet Order:  NPO  Skin:  Reviewed, no issues  Last BM:  7/24  Height:   Ht Readings from Last 1 Encounters:  08/09/14 '4\' 11"'$  (1.499 m)    Weight:   Wt Readings from Last 1 Encounters:  08/09/14 111 lb (50.349 kg)    Ideal Body Weight:  44.6 kg  Wt Readings from Last 10 Encounters:  08/09/14 111 lb (50.349 kg)    BMI:  Body mass index is 22.41 kg/(m^2).  Estimated Nutritional Needs:   Kcal:  1260  Protein:  75-85 gm  Fluid:  per MD  EDUCATION NEEDS:   No education needs identified at this time  Deborah Foster, RD, LDN Pager #: [phone] After-Hours Pager #: [phone]

## 2014-08-10 NOTE — Progress Notes (Signed)
Assisted Dr. Lake Bells with bedside bronch on [name].  Placed pt on 100% FIO2 during procedure.  Sats stayed 96% or better and pt stable throughout.

## 2014-08-10 NOTE — Procedures (Signed)
PCCM Bronchoscopy Procedure Note  The patient was informed of the risks (including but not limited to bleeding, infection, respiratory failure, lung injury, tooth/oral injury) and benefits of the procedure and gave consent, see chart.  Indication: Right lower lobe bleeding, lung mass  Post procedure diagnosis: minor bleeding of the right lower lobe  Location: 2S12  Condition pre procedure: Sedated on Vent  Medications for procedure: Fentanyl 172mg IV, Propofol infusion  Procedure description: The bronchoscope was introduced through the endotracheal tube and passed to the bilateral lungs to the level of the subsegmental bronchi throughout the tracheobronchial tree.  Airway exam revealed normal appearing trachea and left tracheobronchial tree.  The right lower lobe airways were patent including the bronchus intermedius.  There was fresh blood in the right lower lobe and a small clot.  With minor suctioning in the right lower lobe she had some bleeding which stopped spontaneously.  There appeared to be a mass in the superior segment of the right lower lobe but this was difficult to visualize well due to the bleeding.  Procedures performed: none  Specimens sent: none  Condition post procedure: stable on vent  EBL: < 125OI Complications: none  BRoselie Awkward MD [place] TownPCCM Pager: [phone]: [phone] 3pm or if no response, call [phone]

## 2014-08-10 NOTE — Progress Notes (Signed)
Name: Deborah Foster MRN: [id number] DOB: [birth year]    ADMISSION DATE:  08/07/2014 CONSULTATION DATE:  08/08/14 REFERRING MD :  TRH  CHIEF COMPLAINT:  Lung mass  BRIEF PATIENT DESCRIPTION: 44 yo F non-smoker presented with cough x 1 week, possibly hemoptysis. CXR suggested pneumonia but CT shows RLL mass with mets to left adrenal and thoracic vertebrae.  SIGNIFICANT EVENTS  7/25 bronchoscopy> large clot in the right lower lobe, significant bleeding with suctioning, bleeding controlled; intubated for airway protection, ICU monitoring. 7/26 bronchoscopy> minor bleeding noted with suctioning, resolved spontaneously  STUDIES:  CT chest 7/23 > Right lower lobe mass lesion with postobstructive changes and invasion into the bronchus intermedius. changes consistent with left adrenal metastatic disease as well as bony metastatic disease. Mild right middle lobe infiltrate.  SUBJECTIVE: no acute events overnight on vent  VITAL SIGNS: Temp:  [98.3 F (36.8 C)-98.8 F (37.1 C)] 98.3 F (36.8 C) (07/26 0820) Pulse Rate:  [71-132] 99 (07/26 0918) Resp:  [14-34] 18 (07/26 0918) BP: (68-140)/(38-96) 108/53 mmHg (07/26 0918) SpO2:  [95 %-100 %] 100 % (07/26 0918) FiO2 (%):  [50 %-100 %] 50 % (07/26 0844) Weight:  [50.349 kg (111 lb)] 50.349 kg (111 lb) (07/25 1429)  PHYSICAL EXAMINATION:  General: awake on vent HENT: NCAT EOMi, ETT in place PULM: CTA B, vent supported breaths CV: RRR, no mgr GI: BS+, soft, nontender Ext: warm, no edema Neuro: awake, writing questions, following commands   Recent Labs Lab 08/07/14 1300 08/08/14 0458 08/09/14 0448 08/10/14 0254  NA 137 138  --  135  K 3.7 3.4* 3.7 3.8  CL 103 106  --  103  CO2 25 25  --  23  BUN 6 5*  --  <5*  CREATININE 0.55 0.46  --  0.46  GLUCOSE 89 98  --  93    Recent Labs Lab 08/07/14 1300 08/08/14 0458 08/10/14 0254  HGB 13.0 11.5* 11.2*  HCT 38.9 34.7* 33.4*  WBC 12.6* 11.6* 16.8*  PLT 562* 483* 444*   Dg Chest  Port 1 View  08/10/2014   CLINICAL DATA:  Hemoptysis  EXAM: PORTABLE CHEST - 1 VIEW  COMPARISON:  08/09/2014  FINDINGS: An endotracheal tube and nasogastric catheter are noted in satisfactory position. The cardiac shadow is within normal limits. The lungs are well aerated bilaterally. Right perihilar mass lesion is again identified similar to that noted on previous chest x-ray and CT. No pneumothorax is identified.  IMPRESSION: Stable right perihilar mass lesion.  Tubes and lines as described.   Electronically Signed   By: Inez Catalina M.D.   On: 08/10/2014 08:13   Dg Chest Port 1 View  08/09/2014   CLINICAL DATA:  Intubation, status post bronchoscopy  EXAM: PORTABLE CHEST - 1 VIEW  COMPARISON:  08/06/2014  FINDINGS: Right hilar/right lower lobe mass reidentified. Endotracheal tube is appropriately positioned. Heart size upper limits of normal. No pneumothorax. No pleural effusion. Curvilinear left lower lobe atelectasis or scarring.  IMPRESSION: No pneumothorax after bronchoscopy.   Electronically Signed   By: Conchita Paris M.D.   On: 08/09/2014 16:27    ASSESSMENT / PLAN: Pulmonary: A: Massive hemoptysis from right lower lobe mass > no significant bleeding overnight, some bleeding noted again on bronchoscopy 7/26 AM Large right lower lobe mass > likely metastatic lung cancer P: Pressure support ventilation now, SBT, plan extubation Will discuss with thoracic surgery> from an oncologic standpoint it doesn't look like she is a lobectomy candidate, but would  this be appropriate considering bleeding? F/u cytology from bronchoscopy Needs tissue diagnosis> will order CT abdomen/pelvis to look for metastatic lesions; potential areas for biopsy outside lung  Cardiovascular A: Sinus tachycardia post procedure > resolved P:  Tele monitoring  Renal A: No acute issues P:  Monitor BMET and UOP Replace electrolytes as needed  GI A: NO acute issues P:  Advance diet today after extubation  Endo:   A: no acute issues P: Monitor glucose  Infectious: A: Aspiration pneumonitis? P: 7/25 bronch AFB > 7/25 bronch Fungal > 7/25 bronch culture >  7/25 Unasyn >  Add probiotic  Neuro A: Sedation needs on vent P:  RASS goal -2 Propofol gtt + prn fentanyl  My cc time 45 minutes  Updated brother at bedside 08/10/14 AM  Roselie Awkward, MD [facility] Pager: [phone] Cell: [phone] After 3pm or if no response, call [phone]   08/10/2014 9:24 AM

## 2014-08-10 NOTE — Progress Notes (Signed)
[facility]  Case discussed with Dr. Roxan Hockey and Dr. Pascal Lux with IR.  We will plan for a bronchial artery embolization of the RLL followed by a CT guided adrenal mass biopsy later this week.  Roselie Awkward, MD [facility] Pager: [phone] Cell: [phone] After 3pm or if no response, call [phone]

## 2014-08-11 ENCOUNTER — Inpatient Hospital Stay (HOSPITAL_COMMUNITY): Payer: No Typology Code available for payment source

## 2014-08-11 ENCOUNTER — Encounter (HOSPITAL_COMMUNITY): Payer: Self-pay | Admitting: Radiology

## 2014-08-11 DIAGNOSIS — E278 Other specified disorders of adrenal gland: Secondary | ICD-10-CM | POA: Insufficient documentation

## 2014-08-11 LAB — CBC WITH DIFFERENTIAL/PLATELET
BASOS ABS: 0 10*3/uL (ref 0.0–0.1)
Basophils Relative: 0 % (ref 0–1)
EOS ABS: 0 10*3/uL (ref 0.0–0.7)
Eosinophils Relative: 0 % (ref 0–5)
HEMATOCRIT: 32 % — AB (ref 36.0–46.0)
Hemoglobin: 10.9 g/dL — ABNORMAL LOW (ref 12.0–15.0)
Lymphocytes Relative: 11 % — ABNORMAL LOW (ref 12–46)
Lymphs Abs: 1.5 10*3/uL (ref 0.7–4.0)
MCH: 31.4 pg (ref 26.0–34.0)
MCHC: 34.1 g/dL (ref 30.0–36.0)
MCV: 92.2 fL (ref 78.0–100.0)
MONO ABS: 1 10*3/uL (ref 0.1–1.0)
Monocytes Relative: 7 % (ref 3–12)
Neutro Abs: 10.7 10*3/uL — ABNORMAL HIGH (ref 1.7–7.7)
Neutrophils Relative %: 82 % — ABNORMAL HIGH (ref 43–77)
PLATELETS: 393 10*3/uL (ref 150–400)
RBC: 3.47 MIL/uL — ABNORMAL LOW (ref 3.87–5.11)
RDW: 12.9 % (ref 11.5–15.5)
WBC: 13.2 10*3/uL — ABNORMAL HIGH (ref 4.0–10.5)

## 2014-08-11 LAB — BASIC METABOLIC PANEL
Anion gap: 8 (ref 5–15)
BUN: <5 mg/dL — ABNORMAL LOW (ref 6–20)
CO2: 23 mmol/L (ref 22–32)
Calcium: 8.2 mg/dL — ABNORMAL LOW (ref 8.9–10.3)
Chloride: 108 mmol/L (ref 101–111)
Creatinine, Ser: 0.44 mg/dL (ref 0.44–1.00)
Glucose, Bld: 101 mg/dL — ABNORMAL HIGH (ref 65–99)
Potassium: 3.3 mmol/L — ABNORMAL LOW (ref 3.5–5.1)
SODIUM: 139 mmol/L (ref 135–145)

## 2014-08-11 MED ORDER — FAMOTIDINE 20 MG PO TABS: 20.0000 mg | ORAL_TABLET | Freq: Two times a day (BID) | ORAL | Status: DC

## 2014-08-11 MED ORDER — IOHEXOL 300 MG/ML  SOLN
80.0000 mL | Freq: Once | INTRAMUSCULAR | Status: AC | PRN
  Administered 2014-08-11: 80 mL via INTRAVENOUS

## 2014-08-11 MED ORDER — POTASSIUM CHLORIDE CRYS ER 20 MEQ PO TBCR
20.0000 meq | EXTENDED_RELEASE_TABLET | ORAL | Status: AC
  Administered 2014-08-11 (×2): 20 meq via ORAL
  Filled 2014-08-11 (×2): qty 1

## 2014-08-11 MED ORDER — IOHEXOL 300 MG/ML  SOLN
25.0000 mL | INTRAMUSCULAR | Status: AC
  Administered 2014-08-11 (×2): 25 mL via ORAL

## 2014-08-11 MED ORDER — SODIUM CHLORIDE 0.9 % IV SOLN: INTRAVENOUS | Status: DC

## 2014-08-11 NOTE — Progress Notes (Signed)
[facility] ADULT ICU REPLACEMENT PROTOCOL FOR AM LAB REPLACEMENT ONLY  The patient does apply for the [facility] Adult ICU Electrolyte Replacment Protocol based on the criteria listed below:   1. Is GFR >/= 40 ml/min? Yes.    Patient's GFR today is >60 2. Is urine output >/= 0.5 ml/kg/hr for the last 6 hours? Yes.   Patient's UOP is 3.3 ml/kg/hr 3. Is BUN < 60 mg/dL? Yes.    Patient's BUN today is <5 4. Abnormal electrolyte(s): K3.3 5. Ordered repletion with: Per protocol 6. If a panic level lab has been reported, has the CCM MD in charge been notified? Yes.  .   Physician:  Janalyn Harder 08/11/2014 5:20 AM

## 2014-08-11 NOTE — Progress Notes (Signed)
   Name: Deborah Foster MRN: [id number] DOB: 1970-01-22    ADMISSION DATE:  08/07/2014 CONSULTATION DATE:  08/08/14 REFERRING MD :  TRH  CHIEF COMPLAINT:  Lung mass  BRIEF PATIENT DESCRIPTION: 44 yo F non-smoker presented with cough x 1 week, possibly hemoptysis. CXR suggested pneumonia but CT shows RLL mass with mets to left adrenal and thoracic vertebrae.  SIGNIFICANT EVENTS  7/25 bronchoscopy> large clot in the right lower lobe, significant bleeding with suctioning, bleeding controlled; intubated for airway protection, ICU monitoring. 7/26 bronchoscopy> minor bleeding noted with suctioning, resolved spontaneously; IR bronchial artery embolization; Extubated post procedure  STUDIES:  CT chest 7/23 > Right lower lobe mass lesion with postobstructive changes and invasion into the bronchus intermedius. changes consistent with left adrenal metastatic disease as well as bony metastatic disease. Mild right middle lobe infiltrate. Bronch wash cytology 7/25 negative CT Ab/Pelvis 7/27 >   SUBJECTIVE: Extubated yesterday, feeling well  VITAL SIGNS: Temp:  [98.1 F (36.7 C)-98.8 F (37.1 C)] 98.1 F (36.7 C) (07/27 0742) Pulse Rate:  [74-113] 97 (07/27 1000) Resp:  [14-24] 23 (07/27 1000) BP: (90-117)/(45-68) 112/64 mmHg (07/27 1000) SpO2:  [98 %-100 %] 99 % (07/27 1000) FiO2 (%):  [40 %] 40 % (07/26 1700)  PHYSICAL EXAMINATION:  General: awake, alert HENT: NCAT EOMi PULM: CTA B CV: RRR, no mgr GI: BS+, soft, nontender Ext: warm, no edema Neuro: awake, asking questions, clear speech   Recent Labs Lab 08/08/14 0458 08/09/14 0448 08/10/14 0254 08/11/14 0218  NA 138  --  135 139  K 3.4* 3.7 3.8 3.3*  CL 106  --  103 108  CO2 25  --  23 23  BUN 5*  --  <5* <5*  CREATININE 0.46  --  0.46 0.44  GLUCOSE 98  --  93 101*    Recent Labs Lab 08/08/14 0458 08/10/14 0254 08/11/14 0218  HGB 11.5* 11.2* 10.9*  HCT 34.7* 33.4* 32.0*  WBC 11.6* 16.8* 13.2*  PLT 483* 444* 393      ASSESSMENT / PLAN: Pulmonary: A: Massive hemoptysis from right lower lobe mass > no significant bleeding overnight, some bleeding noted again on bronchoscopy 7/26 AM; s/p IR embolization 7/27 Large right lower lobe mass > likely metastatic lung cancer P: Would not pursue biopsy again of the lung mass given bleeding Needs tissue diagnosis> will order CT abdomen/pelvis to look for metastatic lesions; potential areas for biopsy outside lung (likely adrenal gland biopsy for 7/27)  Cardiovascular A: Sinus tachycardia post procedure > resolved P:    Renal A: No acute issues Receiving IV contrast nearly daily, at risk for AKI P:  Monitor BMET and UOP Replace electrolytes as needed Start IVF to protect kidneys 7/27  GI A: No acute issues P:  Advance diet   Endo:  A: no acute issues P: Monitor glucose  Infectious: A: Aspiration pneumonitis? P: 7/25 bronch AFB > 7/25 bronch Fungal > 7/25 bronch culture >  7/23 Unasyn > plan 7 days Add probiotic  Neuro A: No acute issues P:  D/c PAD protocol   Updated husband at bedside 08/11/14 AM  Roselie Awkward, MD [place] [facility] Pager: [phone] Cell: [phone] After 3pm or if no response, call [phone]   08/11/2014 10:37 AM

## 2014-08-11 NOTE — Progress Notes (Signed)
Patient ID: Deborah Foster, female   DOB: [birth year], 44 y.o.   MRN: [id number]    Referring Physician(s): CCM  Chief Complaint: Right lung/left adrenal masses   Subjective:  Pt doing fairly well; denies CP,dyspnea; has had minimal amt hemoptysis since bronch art embo 7/26  Allergies: Review of patient's allergies indicates no known allergies.  Medications: Prior to Admission medications   Not on File     Vital Signs: BP 106/56 mmHg  Pulse 94  Temp(Src) 98.1 F (36.7 C) (Oral)  Resp 18  Ht _0  (1.499 m)  Wt 111 lb (50.349 kg)  BMI 22.41 kg/m2  SpO2 100%  LMP 06/27/2014  Physical Exam pt awake/alert; chest- CTA bilat; heart- RRR; abd- soft,+BS,NT; ext- no edema; puncture site rt CFA clean and dry, no hematoma, mildly tender; intact distal pulses  Imaging: Ct Angio Chest Pe W/cm &/or Wo Cm  08/07/2014   CLINICAL DATA:  Right middle lobe pneumonia on chest x-ray, recent hemoptysis  EXAM: CT ANGIOGRAPHY CHEST WITH CONTRAST  TECHNIQUE: Multidetector CT imaging of the chest was performed using the standard protocol during bolus administration of intravenous contrast. Multiplanar CT image reconstructions and MIPs were obtained to evaluate the vascular anatomy.  CONTRAST:  85m OMNIPAQUE IOHEXOL 350 MG/ML SOLN  COMPARISON:  Chest x-ray from the previous day  FINDINGS: Left lung is well aerated without evidence focal infiltrate or sizable effusion. The right lung is also well aerated and demonstrates some mild infiltrate within the right middle lobe. In the right lower lobe emanating from the hilum inferiorly there is a 4.4 by 3.0 by 3.6 cm peripherally spiculated mass lesion consistent with a primary pulmonary neoplasm. Distal to this there are changes of postobstructive infiltrate. There is occlusion of the lower lobe bronchus as well as significant attenuation of the lower lobe pulmonary artery. Filling defect is identified within the bronchus intermedius consistent with local in  growth. Right hilar adenopathy measuring 14 mm in short axis is seen.  The thoracic aorta in its branches are within normal limits. Pulmonary artery demonstrates a normal branching pattern without evidence of pulmonary embolism. Narrowing is noted in the right lower lobe branches consistent with the underlying mass lesion.  Scanning into the upper abdomen demonstrates evidence of a 3.8 cm mass in the left adrenal gland consistent with metastatic disease. The bony structures show patchy lucencies in the thoracic vertebral bodies consistent with metastatic disease.  Review of the MIP images confirms the above findings.  IMPRESSION: Right lower lobe mass lesion with postobstructive changes and invasion into the bronchus intermedius as described consistent with a primary pulmonary neoplasm. There are changes consistent with left adrenal metastatic disease as well as bony metastatic disease.  Mild right middle lobe infiltrate.   Electronically Signed   By: MInez CatalinaM.D.   On: 08/07/2014 14:24   Ir Aorta/thoracic  08/10/2014   INDICATION: No known malignancy, now with large (approximately 4.5 x 3.0 cm) spiculated mass within the superior segment of the right lower lobe. The page underwent attempted bronchoscopic biopsy, however the biopsy could not be performed secondary to active bleeding into the airway. As such, the patient was intubated for airway protection purposes.  The patient was observed uneventfully overnight in the ICU, though repeat bronchoscopy performed this morning demonstrated persistent bleeding from this infiltrative right lower lobe mass.  As such, request made for bronchial artery embolization in hopes of avoiding potential life threatening hemoptysis.  EXAM: 1. ULTRASOUND GUIDANCE FOR ARTERIAL ACCESS 2. SELECTIVE  RIGHT BRONCHIAL ARTERIOGRAM (THIRD ORDER) AND FLUOROSCOPIC GUIDED PERCUTANEOUS PARTICLE EMBOLIZATION.  COMPARISON:  Chest CTA - 08/07/2014  MEDICATIONS: Fentanyl 100 mcg IV; Versed at  3 mg IV  Sedation time: 30 minutes  CONTRAST:  62m OMNIPAQUE IOHEXOL 300 MG/ML  SOLN  FLUOROSCOPY TIME:  6 minutes 36 seconds (3758mGy)  COMPLICATIONS: None immediate  TECHNIQUE: Informed written consent was obtained from the patient's family after a discussion of the risks, benefits and alternatives to treatment. Questions regarding the procedure were encouraged and answered. A timeout was performed prior to the initiation of the procedure.  The right groin was prepped and draped in the usual sterile fashion, and a sterile drape was applied covering the operative field. Maximum barrier sterile technique with sterile gowns and gloves were used for the procedure. A timeout was performed prior to the initiation of the procedure.  The right femoral head was marked fluoroscopically. Under ultrasound guidance, the right common femoral artery was accessed with a micropuncture kit after the overlying soft tissues were anesthetized with 1% lidocaine. An ultrasound image was saved for documentation purposes. The micropuncture sheath was exchanged for a 5 [place] sheath over a Bentson wire. A closure arteriogram was performed through the side of the sheath confirming access within the right common femoral artery.  Over a Bentson wire, a Mickelson catheter was advanced to the level of the thoracic aorta where it was back bled and flushed. The catheter was then utilized to select the hypertrophied right bronchial artery. A selective right bronchial arteriogram was performed.  With the use of a 0.014 fathom wire, a high-flow Renegade micro catheter was advanced into the distal aspect of the right bronchial artery supplying the superior segment of the right lower lobe. Repeat sub selective arteriogram was performed.  The distal aspect of the inferior segment of the right bronchial artery was then subsequently percutaneously particle embolized with approximately 3/4 of 1 vial of 300-500 micron Embospheres. The micro  catheter was retracted into the caudal aspect of the right bronchial artery and a post embolization arteriogram was performed.  Images reviewed and the procedure was terminated. All wires catheters and sheaths were removed from the patient. Hemostasis was achieved at the right groin access site with deployment of an Exoseal closure device. A dressing was placed. The patient tolerated the procedure well without immediate postprocedural complication.  FINDINGS: Selective right bronchial arteriogram demonstrates marked hypertrophy of this vessel as demonstrated on preceding chest CTA.  The caudal tributaries of the right bronchial artery were noted to supply an ill-defined area of hyperemia within the superior medial aspect of the right lower lobe correlating with the spiculated mass seen on preceding chest CT. Following percutaneous selective particle embolization, there is near absence of flow into these abnormal distal vessels supplying the known spiculated mass.  IMPRESSION: Technically successful right bronchial artery percutaneous particle embolization for active bleeding from the known infiltrative mass within the superior segment of the right lower lobe.   Electronically Signed   By: JSandi MariscalM.D.   On: 08/10/2014 18:18   Ir Angiogram Selective Each Additional Vessel  08/10/2014   INDICATION: No known malignancy, now with large (approximately 4.5 x 3.0 cm) spiculated mass within the superior segment of the right lower lobe. The page underwent attempted bronchoscopic biopsy, however the biopsy could not be performed secondary to active bleeding into the airway. As such, the patient was intubated for airway protection purposes.  The patient was observed uneventfully overnight in the ICU, though  repeat bronchoscopy performed this morning demonstrated persistent bleeding from this infiltrative right lower lobe mass.  As such, request made for bronchial artery embolization in hopes of avoiding potential life  threatening hemoptysis.  EXAM: 1. ULTRASOUND GUIDANCE FOR ARTERIAL ACCESS 2. SELECTIVE RIGHT BRONCHIAL ARTERIOGRAM (THIRD ORDER) AND FLUOROSCOPIC GUIDED PERCUTANEOUS PARTICLE EMBOLIZATION.  COMPARISON:  Chest CTA - 08/07/2014  MEDICATIONS: Fentanyl 100 mcg IV; Versed at 3 mg IV  Sedation time: 30 minutes  CONTRAST:  71m OMNIPAQUE IOHEXOL 300 MG/ML  SOLN  FLUOROSCOPY TIME:  6 minutes 36 seconds (3332mGy)  COMPLICATIONS: None immediate  TECHNIQUE: Informed written consent was obtained from the patient's family after a discussion of the risks, benefits and alternatives to treatment. Questions regarding the procedure were encouraged and answered. A timeout was performed prior to the initiation of the procedure.  The right groin was prepped and draped in the usual sterile fashion, and a sterile drape was applied covering the operative field. Maximum barrier sterile technique with sterile gowns and gloves were used for the procedure. A timeout was performed prior to the initiation of the procedure.  The right femoral head was marked fluoroscopically. Under ultrasound guidance, the right common femoral artery was accessed with a micropuncture kit after the overlying soft tissues were anesthetized with 1% lidocaine. An ultrasound image was saved for documentation purposes. The micropuncture sheath was exchanged for a 5 [place] sheath over a Bentson wire. A closure arteriogram was performed through the side of the sheath confirming access within the right common femoral artery.  Over a Bentson wire, a Mickelson catheter was advanced to the level of the thoracic aorta where it was back bled and flushed. The catheter was then utilized to select the hypertrophied right bronchial artery. A selective right bronchial arteriogram was performed.  With the use of a 0.014 fathom wire, a high-flow Renegade micro catheter was advanced into the distal aspect of the right bronchial artery supplying the superior segment of the  right lower lobe. Repeat sub selective arteriogram was performed.  The distal aspect of the inferior segment of the right bronchial artery was then subsequently percutaneously particle embolized with approximately 3/4 of 1 vial of 300-500 micron Embospheres. The micro catheter was retracted into the caudal aspect of the right bronchial artery and a post embolization arteriogram was performed.  Images reviewed and the procedure was terminated. All wires catheters and sheaths were removed from the patient. Hemostasis was achieved at the right groin access site with deployment of an Exoseal closure device. A dressing was placed. The patient tolerated the procedure well without immediate postprocedural complication.  FINDINGS: Selective right bronchial arteriogram demonstrates marked hypertrophy of this vessel as demonstrated on preceding chest CTA.  The caudal tributaries of the right bronchial artery were noted to supply an ill-defined area of hyperemia within the superior medial aspect of the right lower lobe correlating with the spiculated mass seen on preceding chest CT. Following percutaneous selective particle embolization, there is near absence of flow into these abnormal distal vessels supplying the known spiculated mass.  IMPRESSION: Technically successful right bronchial artery percutaneous particle embolization for active bleeding from the known infiltrative mass within the superior segment of the right lower lobe.   Electronically Signed   By: JSandi MariscalM.D.   On: 08/10/2014 18:18   Ir Angiogram Follow Up Study  08/10/2014   INDICATION: No known malignancy, now with large (approximately 4.5 x 3.0 cm) spiculated mass within the superior segment of the right lower lobe.  The page underwent attempted bronchoscopic biopsy, however the biopsy could not be performed secondary to active bleeding into the airway. As such, the patient was intubated for airway protection purposes.  The patient was observed  uneventfully overnight in the ICU, though repeat bronchoscopy performed this morning demonstrated persistent bleeding from this infiltrative right lower lobe mass.  As such, request made for bronchial artery embolization in hopes of avoiding potential life threatening hemoptysis.  EXAM: 1. ULTRASOUND GUIDANCE FOR ARTERIAL ACCESS 2. SELECTIVE RIGHT BRONCHIAL ARTERIOGRAM (THIRD ORDER) AND FLUOROSCOPIC GUIDED PERCUTANEOUS PARTICLE EMBOLIZATION.  COMPARISON:  Chest CTA - 08/07/2014  MEDICATIONS: Fentanyl 100 mcg IV; Versed at 3 mg IV  Sedation time: 30 minutes  CONTRAST:  66m OMNIPAQUE IOHEXOL 300 MG/ML  SOLN  FLUOROSCOPY TIME:  6 minutes 36 seconds (3161mGy)  COMPLICATIONS: None immediate  TECHNIQUE: Informed written consent was obtained from the patient's family after a discussion of the risks, benefits and alternatives to treatment. Questions regarding the procedure were encouraged and answered. A timeout was performed prior to the initiation of the procedure.  The right groin was prepped and draped in the usual sterile fashion, and a sterile drape was applied covering the operative field. Maximum barrier sterile technique with sterile gowns and gloves were used for the procedure. A timeout was performed prior to the initiation of the procedure.  The right femoral head was marked fluoroscopically. Under ultrasound guidance, the right common femoral artery was accessed with a micropuncture kit after the overlying soft tissues were anesthetized with 1% lidocaine. An ultrasound image was saved for documentation purposes. The micropuncture sheath was exchanged for a 5 [place] sheath over a Bentson wire. A closure arteriogram was performed through the side of the sheath confirming access within the right common femoral artery.  Over a Bentson wire, a Mickelson catheter was advanced to the level of the thoracic aorta where it was back bled and flushed. The catheter was then utilized to select the hypertrophied  right bronchial artery. A selective right bronchial arteriogram was performed.  With the use of a 0.014 fathom wire, a high-flow Renegade micro catheter was advanced into the distal aspect of the right bronchial artery supplying the superior segment of the right lower lobe. Repeat sub selective arteriogram was performed.  The distal aspect of the inferior segment of the right bronchial artery was then subsequently percutaneously particle embolized with approximately 3/4 of 1 vial of 300-500 micron Embospheres. The micro catheter was retracted into the caudal aspect of the right bronchial artery and a post embolization arteriogram was performed.  Images reviewed and the procedure was terminated. All wires catheters and sheaths were removed from the patient. Hemostasis was achieved at the right groin access site with deployment of an Exoseal closure device. A dressing was placed. The patient tolerated the procedure well without immediate postprocedural complication.  FINDINGS: Selective right bronchial arteriogram demonstrates marked hypertrophy of this vessel as demonstrated on preceding chest CTA.  The caudal tributaries of the right bronchial artery were noted to supply an ill-defined area of hyperemia within the superior medial aspect of the right lower lobe correlating with the spiculated mass seen on preceding chest CT. Following percutaneous selective particle embolization, there is near absence of flow into these abnormal distal vessels supplying the known spiculated mass.  IMPRESSION: Technically successful right bronchial artery percutaneous particle embolization for active bleeding from the known infiltrative mass within the superior segment of the right lower lobe.   Electronically Signed   By: JJenny Reichmann  Watts M.D.   On: 08/10/2014 18:18   Ir US Guide Vasc Access Right  08/10/2014   INDICATION: No known malignancy, now with large (approximately 4.5 x 3.0 cm) spiculated mass within the superior segment of  the right lower lobe. The page underwent attempted bronchoscopic biopsy, however the biopsy could not be performed secondary to active bleeding into the airway. As such, the patient was intubated for airway protection purposes.  The patient was observed uneventfully overnight in the ICU, though repeat bronchoscopy performed this morning demonstrated persistent bleeding from this infiltrative right lower lobe mass.  As such, request made for bronchial artery embolization in hopes of avoiding potential life threatening hemoptysis.  EXAM: 1. ULTRASOUND GUIDANCE FOR ARTERIAL ACCESS 2. SELECTIVE RIGHT BRONCHIAL ARTERIOGRAM (THIRD ORDER) AND FLUOROSCOPIC GUIDED PERCUTANEOUS PARTICLE EMBOLIZATION.  COMPARISON:  Chest CTA - 08/07/2014  MEDICATIONS: Fentanyl 100 mcg IV; Versed at 3 mg IV  Sedation time: 30 minutes  CONTRAST:  16m OMNIPAQUE IOHEXOL 300 MG/ML  SOLN  FLUOROSCOPY TIME:  6 minutes 36 seconds (3818mGy)  COMPLICATIONS: None immediate  TECHNIQUE: Informed written consent was obtained from the patient's family after a discussion of the risks, benefits and alternatives to treatment. Questions regarding the procedure were encouraged and answered. A timeout was performed prior to the initiation of the procedure.  The right groin was prepped and draped in the usual sterile fashion, and a sterile drape was applied covering the operative field. Maximum barrier sterile technique with sterile gowns and gloves were used for the procedure. A timeout was performed prior to the initiation of the procedure.  The right femoral head was marked fluoroscopically. Under ultrasound guidance, the right common femoral artery was accessed with a micropuncture kit after the overlying soft tissues were anesthetized with 1% lidocaine. An ultrasound image was saved for documentation purposes. The micropuncture sheath was exchanged for a 5 [place] sheath over a Bentson wire. A closure arteriogram was performed through the side of the  sheath confirming access within the right common femoral artery.  Over a Bentson wire, a Mickelson catheter was advanced to the level of the thoracic aorta where it was back bled and flushed. The catheter was then utilized to select the hypertrophied right bronchial artery. A selective right bronchial arteriogram was performed.  With the use of a 0.014 fathom wire, a high-flow Renegade micro catheter was advanced into the distal aspect of the right bronchial artery supplying the superior segment of the right lower lobe. Repeat sub selective arteriogram was performed.  The distal aspect of the inferior segment of the right bronchial artery was then subsequently percutaneously particle embolized with approximately 3/4 of 1 vial of 300-500 micron Embospheres. The micro catheter was retracted into the caudal aspect of the right bronchial artery and a post embolization arteriogram was performed.  Images reviewed and the procedure was terminated. All wires catheters and sheaths were removed from the patient. Hemostasis was achieved at the right groin access site with deployment of an Exoseal closure device. A dressing was placed. The patient tolerated the procedure well without immediate postprocedural complication.  FINDINGS: Selective right bronchial arteriogram demonstrates marked hypertrophy of this vessel as demonstrated on preceding chest CTA.  The caudal tributaries of the right bronchial artery were noted to supply an ill-defined area of hyperemia within the superior medial aspect of the right lower lobe correlating with the spiculated mass seen on preceding chest CT. Following percutaneous selective particle embolization, there is near absence of flow into these abnormal distal  vessels supplying the known spiculated mass.  IMPRESSION: Technically successful right bronchial artery percutaneous particle embolization for active bleeding from the known infiltrative mass within the superior segment of the right lower  lobe.   Electronically Signed   By: Sandi Mariscal M.D.   On: 08/10/2014 18:18   Dg Chest Port 1 View  08/11/2014   CLINICAL DATA:  History of thumb opt this cysts  EXAM: PORTABLE CHEST - 1 VIEW  COMPARISON:  Portable chest x-ray of 08/10/2014 and CT chest of 08/07/2014  FINDINGS: The endotracheal tube has been removed. There is persistent opacity overlying the right hilum and infrahilar region consistent with the patient's known carcinoma. Mild left basilar linear atelectasis is present. Heart size is stable.  IMPRESSION: Endotracheal tube removed. No change in right hilar -infrahilar opacity consistent with mass by CT.   Electronically Signed   By: Ivar Drape M.D.   On: 08/11/2014 08:02   Dg Chest Port 1 View  08/10/2014   CLINICAL DATA:  Hemoptysis  EXAM: PORTABLE CHEST - 1 VIEW  COMPARISON:  08/09/2014  FINDINGS: An endotracheal tube and nasogastric catheter are noted in satisfactory position. The cardiac shadow is within normal limits. The lungs are well aerated bilaterally. Right perihilar mass lesion is again identified similar to that noted on previous chest x-ray and CT. No pneumothorax is identified.  IMPRESSION: Stable right perihilar mass lesion.  Tubes and lines as described.   Electronically Signed   By: Inez Catalina M.D.   On: 08/10/2014 08:13   Dg Chest Port 1 View  08/09/2014   CLINICAL DATA:  Intubation, status post bronchoscopy  EXAM: PORTABLE CHEST - 1 VIEW  COMPARISON:  08/06/2014  FINDINGS: Right hilar/right lower lobe mass reidentified. Endotracheal tube is appropriately positioned. Heart size upper limits of normal. No pneumothorax. No pleural effusion. Curvilinear left lower lobe atelectasis or scarring.  IMPRESSION: No pneumothorax after bronchoscopy.   Electronically Signed   By: Conchita Paris M.D.   On: 08/09/2014 16:27   [place] [facility] [employer]  08/10/2014   INDICATION: No known malignancy, now with large (approximately 4.5 x 3.0 cm)  spiculated mass within the superior segment of the right lower lobe. The page underwent attempted bronchoscopic biopsy, however the biopsy could not be performed secondary to active bleeding into the airway. As such, the patient was intubated for airway protection purposes.  The patient was observed uneventfully overnight in the ICU, though repeat bronchoscopy performed this morning demonstrated persistent bleeding from this infiltrative right lower lobe mass.  As such, request made for bronchial artery embolization in hopes of avoiding potential life threatening hemoptysis.  EXAM: 1. ULTRASOUND GUIDANCE FOR ARTERIAL ACCESS 2. SELECTIVE RIGHT BRONCHIAL ARTERIOGRAM (THIRD ORDER) AND FLUOROSCOPIC GUIDED PERCUTANEOUS PARTICLE EMBOLIZATION.  COMPARISON:  Chest CTA - 08/07/2014  MEDICATIONS: Fentanyl 100 mcg IV; Versed at 3 mg IV  Sedation time: 30 minutes  CONTRAST:  38m OMNIPAQUE IOHEXOL 300 MG/ML  SOLN  FLUOROSCOPY TIME:  6 minutes 36 seconds (3782mGy)  COMPLICATIONS: None immediate  TECHNIQUE: Informed written consent was obtained from the patient's family after a discussion of the risks, benefits and alternatives to treatment. Questions regarding the procedure were encouraged and answered. A timeout was performed prior to the initiation of the procedure.  The right groin was prepped and draped in the usual sterile fashion, and a sterile drape was applied covering the operative field. Maximum barrier sterile technique with sterile gowns and gloves were  used for the procedure. A timeout was performed prior to the initiation of the procedure.  The right femoral head was marked fluoroscopically. Under ultrasound guidance, the right common femoral artery was accessed with a micropuncture kit after the overlying soft tissues were anesthetized with 1% lidocaine. An ultrasound image was saved for documentation purposes. The micropuncture sheath was exchanged for a 5 [place] vascular sheath over a Bentson wire. A closure  arteriogram was performed through the side of the sheath confirming access within the right common femoral artery.  Over a Bentson wire, a Mickelson catheter was advanced to the level of the thoracic aorta where it was back bled and flushed. The catheter was then utilized to select the hypertrophied right bronchial artery. A selective right bronchial arteriogram was performed.  With the use of a 0.014 fathom wire, a high-flow Renegade micro catheter was advanced into the distal aspect of the right bronchial artery supplying the superior segment of the right lower lobe. Repeat sub selective arteriogram was performed.  The distal aspect of the inferior segment of the right bronchial artery was then subsequently percutaneously particle embolized with approximately 3/4 of 1 vial of 300-500 micron Embospheres. The micro catheter was retracted into the caudal aspect of the right bronchial artery and a post embolization arteriogram was performed.  Images reviewed and the procedure was terminated. All wires catheters and sheaths were removed from the patient. Hemostasis was achieved at the right groin access site with deployment of an Exoseal closure device. A dressing was placed. The patient tolerated the procedure well without immediate postprocedural complication.  FINDINGS: Selective right bronchial arteriogram demonstrates marked hypertrophy of this vessel as demonstrated on preceding chest CTA.  The caudal tributaries of the right bronchial artery were noted to supply an ill-defined area of hyperemia within the superior medial aspect of the right lower lobe correlating with the spiculated mass seen on preceding chest CT. Following percutaneous selective particle embolization, there is near absence of flow into these abnormal distal vessels supplying the known spiculated mass.  IMPRESSION: Technically successful right bronchial artery percutaneous particle embolization for active bleeding from the known infiltrative  mass within the superior segment of the right lower lobe.   Electronically Signed   By: Sandi Mariscal M.D.   On: 08/10/2014 18:18    Labs:  CBC:  Recent Labs  08/07/14 1300 08/08/14 0458 08/10/14 0254 08/11/14 0218  WBC 12.6* 11.6* 16.8* 13.2*  HGB 13.0 11.5* 11.2* 10.9*  HCT 38.9 34.7* 33.4* 32.0*  PLT 562* 483* 444* 393    COAGS:  Recent Labs  08/10/14 1150  INR 1.17    BMP:  Recent Labs  08/07/14 1300 08/08/14 0458 08/09/14 0448 08/10/14 0254 08/11/14 0218  NA 137 138  --  135 139  K 3.7 3.4* 3.7 3.8 3.3*  CL 103 106  --  103 108  CO2 25 25  --  23 23  GLUCOSE 89 98  --  93 101*  BUN 6 5*  --  <5* <5*  CALCIUM 8.8* 8.5*  --  8.3* 8.2*  CREATININE 0.55 0.46  --  0.46 0.44  GFRNONAA >60 >60  --  >60 >60  GFRAA >60 >60  --  >60 >60    LIVER FUNCTION TESTS:  Recent Labs  08/08/14 0458  BILITOT 0.5  AST 16  ALT 14  ALKPHOS 145*  PROT 6.1*  ALBUMIN 2.7*    Assessment and Plan: Rt lung mass with hx hemoptysis, s/p bronchial artery embolization 7/26;  currently stable; afebrile; hgb 10.9, WBC 13.2, K 3.3, creat 0.4; cytology from bronch washing 7/25 neg for malig cells; tissue diagnosis needed; pt also has left adrenal mass noted on CT chest- will obtain CT abd /pelvis today to fully assess and tent set pt up for CT guided left adrenal mass biopsy on 7/28. Risks and benefits discussed with the patient including, but not limited to bleeding, infection, damage to adjacent structures or low yield requiring additional tests.All of the patient's questions were answered, patient is agreeable to proceed.Consent signed and in chart.     Signed: D. Rowe Robert 08/11/2014, 11:34 AM   I spent a total of 15 minutes in face to face in clinical consultation/evaluation, greater than 50% of which was counseling/coordinating care for CT guided left adrenal mass biopsy

## 2014-08-11 NOTE — Progress Notes (Signed)
Pt. Arrived to the unit via bed from CT accompanied by family. Pt. Is alert and oriented with no signs of distress noted. Pt. Vitals appear stable with no skin issues noted. Pt. Ambulated from bed to bed in room and tolerated well. Educated pt. On use of staff numbers, room telephone and call bell. Call light within reach. Orders released. Reported off to night nurse. No further needs noted at this time.

## 2014-08-11 NOTE — Progress Notes (Signed)
Report called to Martin. Patient transferred off telemetry to CT scan and will then go to new room [place] [facility]. Patient has belongings, medications and chart. Family aware of transfer. Sandre Kitty

## 2014-08-12 DIAGNOSIS — E279 Disorder of adrenal gland, unspecified: Secondary | ICD-10-CM

## 2014-08-12 LAB — CBC
HEMATOCRIT: 34.1 % — AB (ref 36.0–46.0)
HEMOGLOBIN: 11.6 g/dL — AB (ref 12.0–15.0)
MCH: 30.9 pg (ref 26.0–34.0)
MCHC: 34 g/dL (ref 30.0–36.0)
MCV: 90.9 fL (ref 78.0–100.0)
PLATELETS: 399 10*3/uL (ref 150–400)
RBC: 3.75 MIL/uL — ABNORMAL LOW (ref 3.87–5.11)
RDW: 13.1 % (ref 11.5–15.5)
WBC: 18.7 10*3/uL — AB (ref 4.0–10.5)

## 2014-08-12 LAB — BASIC METABOLIC PANEL
ANION GAP: 8 (ref 5–15)
CO2: 27 mmol/L (ref 22–32)
Calcium: 8.2 mg/dL — ABNORMAL LOW (ref 8.9–10.3)
Chloride: 101 mmol/L (ref 101–111)
Creatinine, Ser: 0.42 mg/dL — ABNORMAL LOW (ref 0.44–1.00)
Glucose, Bld: 108 mg/dL — ABNORMAL HIGH (ref 65–99)
Potassium: 3.2 mmol/L — ABNORMAL LOW (ref 3.5–5.1)
Sodium: 136 mmol/L (ref 135–145)

## 2014-08-12 LAB — CULTURE, BLOOD (ROUTINE X 2)
CULTURE: NO GROWTH
Culture: NO GROWTH

## 2014-08-12 LAB — GLUCOSE, CAPILLARY: Glucose-Capillary: 115 mg/dL — ABNORMAL HIGH (ref 65–99)

## 2014-08-12 MED ORDER — GUAIFENESIN ER 600 MG PO TB12
600.0000 mg | ORAL_TABLET | Freq: Two times a day (BID) | ORAL | Status: DC
  Administered 2014-08-12 – 2014-08-17 (×10): 600 mg via ORAL
  Filled 2014-08-12 (×10): qty 1

## 2014-08-12 MED ORDER — POTASSIUM CHLORIDE 10 MEQ/100ML IV SOLN
10.0000 meq | INTRAVENOUS | Status: AC
  Administered 2014-08-12 (×4): 10 meq via INTRAVENOUS
  Filled 2014-08-12 (×4): qty 100

## 2014-08-12 MED ORDER — GUAIFENESIN-DM 100-10 MG/5ML PO SYRP
5.0000 mL | ORAL_SOLUTION | ORAL | Status: DC | PRN
  Administered 2014-08-12: 5 mL via ORAL
  Filled 2014-08-12: qty 5

## 2014-08-12 MED ORDER — SODIUM CHLORIDE 0.9 % IV SOLN
INTRAVENOUS | Status: DC
  Administered 2014-08-12: 08:00:00 via INTRAVENOUS

## 2014-08-12 MED ORDER — SODIUM CHLORIDE 0.9 % IV SOLN
INTRAVENOUS | Status: AC
  Administered 2014-08-12: 11:00:00 via INTRAVENOUS

## 2014-08-12 NOTE — Progress Notes (Signed)
Patient with increasing leukocytosis and febrile today, Dr. Barbie Banner would like to wait to perform biopsy until patient is afebrile. Will discuss with attending today, any questions please feel free to call Dr. Barbie Banner [phone]  Tsosie Billing PA-C Interventional Radiology  08/12/14  8:16 AM

## 2014-08-12 NOTE — Progress Notes (Signed)
Patient Demographics:    Deborah Foster, is a 44 y.o. female, DOB - 1970-11-07, PJA:[id number]  Admit date - 08/07/2014   Admitting Physician Oswald Hillock, MD  Outpatient Primary MD for the patient is [name], MD  LOS - 5   Chief Complaint  Patient presents with  . Cough        Subjective:    Deborah Foster today has, No headache, No chest pain, No abdominal pain - No Nausea, No new weakness tingling or numbness, mild  Cough - but no SOB.  No frank hemoptysis.   Assessment  & Plan :     1. 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   She was extubated on 08/11/2014. No biopsies were done due to ongoing bleeding during bronchoscopy. CT abdomen and pelvis showed left adrenal mass and currently I has been requested to biopsy that lesion. So far clinically this looks like primary bronchogenic carcinoma with metastasis to left adrenal gland, thoracic vertebra, left hip along with possibly kidney.   2. Possible postobstructive pneumonia. Continue on Unasyn and monitor.   3. Hypokalemia. Replaced.    Code Status : Full  Family Communication  : none present  Disposition Plan  : Home  2 days  Consults  :  PCCM  Procedures  :   CT chest 7/23 > Right lower lobe mass lesion with postobstructive changes and invasion into the bronchus intermedius. changes consistent with left adrenal metastatic disease as well as bony metastatic disease. Mild right middle lobe infiltrate.  7/25 bronchoscopy> large clot in the right  lower lobe, significant bleeding with suctioning, bleeding controlled; intubated for airway protection, ICU monitoring.  7/26 bronchoscopy> minor bleeding noted with suctioning, resolved spontaneously; IR bronchial artery embolization; Extubated post procedure  CT Ab/Pelvis 7/27 > left adrenal mass, left acetabular metastatic lesion. Intra-and extrahepatic duct dilation.  CT-guided tissue biopsy ordered for 08/12/2014  DVT Prophylaxis  :    SCDs   Lab Results  Component Value Date   PLT 399 08/12/2014    Inpatient Medications  Scheduled Meds: . ampicillin-sulbactam (UNASYN) IV  1.5 g Intravenous Q6H  . lactobacillus acidophilus  2 tablet Oral TID   Continuous Infusions: . sodium chloride 75 mL/hr at 08/12/14 0805   PRN Meds:.acetaminophen **OR** acetaminophen, albuterol, ondansetron **OR** ondansetron (ZOFRAN) IV  Antibiotics  :     Anti-infectives    Start     Dose/Rate Route Frequency Ordered Stop   08/08/14 1600  azithromycin (ZITHROMAX) 500 mg in dextrose 5 % 250 mL IVPB  Status:  Discontinued     500 mg 250 mL/hr over 60 Minutes Intravenous Every 24 hours 08/07/14 1639 08/08/14 1052   08/08/14 1500  cefTRIAXone (ROCEPHIN) 1 g in dextrose 5 % 50 mL IVPB - Premix  Status:  Discontinued     1 g 100 mL/hr over 30 Minutes Intravenous Every 24 hours 08/07/14 1654 08/08/14 1052   08/08/14 1200  ampicillin-sulbactam (UNASYN) 1.5 g in sodium chloride 0.9 %  50 mL IVPB     1.5 g 100 mL/hr over 30 Minutes Intravenous Every 6 hours 08/08/14 1124     08/07/14 1515  cefTRIAXone (ROCEPHIN) 1 g in dextrose 5 % 50 mL IVPB     1 g 100 mL/hr over 30 Minutes Intravenous  Once 08/07/14 1501 08/07/14 1549   08/07/14 1515  azithromycin (ZITHROMAX) 500 mg in dextrose 5 % 250 mL IVPB     500 mg 250 mL/hr over 60 Minutes Intravenous  Once 08/07/14 1501 08/07/14 1721        Objective:   Filed Vitals:   08/11/14 1800 08/11/14 1900 08/11/14 1910 08/12/14 0544  BP:   105/61 105/54  Pulse:  103  108 112  Temp:   98.6 F (37 C) 100.3 F (37.9 C)  TempSrc:   Oral Oral  Resp: _0 Height:      Weight:  51.8 kg (114 lb 3.2 oz)    SpO2: 99%  100% 96%    Wt Readings from Last 3 Encounters:  08/11/14 51.8 kg (114 lb 3.2 oz)     Intake/Output Summary (Last 24 hours) at 08/12/14 1103 Last data filed at 08/12/14 1031  Gross per 24 hour  Intake    625 ml  Output   3200 ml  Net  -2575 ml     Physical Exam  Awake Alert, Oriented X 3, No new F.N deficits, Normal affect Taylor.AT,PERRAL Supple Neck,No JVD, No cervical lymphadenopathy appriciated.  Symmetrical Chest wall movement, Good air movement bilaterally, CTAB RRR,No Gallops,Rubs or new Murmurs, No Parasternal Heave +ve B.Sounds, Abd Soft, No tenderness, No organomegaly appriciated, No rebound - guarding or rigidity. No Cyanosis, Clubbing or edema, No new Rash or bruise       Data Review:   Micro Results Recent Results (from the past 240 hour(s))  Culture, blood (routine x 2)     Status: None (Preliminary result)   Collection Time: 08/07/14  5:10 PM  Result Value Ref Range Status   Specimen Description BLOOD RIGHT HAND  Final   Special Requests BOTTLES DRAWN AEROBIC AND ANAEROBIC 10CC  Final   Culture NO GROWTH 4 DAYS  Final   Report Status PENDING  Incomplete  Culture, blood (routine x 2)     Status: None (Preliminary result)   Collection Time: 08/07/14  5:13 PM  Result Value Ref Range Status   Specimen Description BLOOD LEFT WRIST  Final   Special Requests BOTTLES DRAWN AEROBIC AND ANAEROBIC 10CC  Final   Culture NO GROWTH 4 DAYS  Final   Report Status PENDING  Incomplete  AFB culture with smear     Status: None (Preliminary result)   Collection Time: 08/09/14  3:30 PM  Result Value Ref Range Status   Specimen Description BRONCHIAL WASHINGS  Final   Special Requests Normal  Final   Acid Fast Smear   Final    NO ACID FAST BACILLI SEEN Performed at [facility]    Culture   Final     CULTURE WILL BE EXAMINED FOR 6 WEEKS BEFORE ISSUING A FINAL REPORT Performed at [facility]    Report Status PENDING  Incomplete  Fungus Culture with Smear     Status: None (Preliminary result)   Collection Time: 08/09/14  3:30 PM  Result Value Ref Range Status   Specimen Description BRONCHIAL WASHINGS  Final   Special Requests Normal  Final   Fungal Smear   Final    NO  YEAST OR FUNGAL ELEMENTS SEEN Performed at [facility]    Culture   Final    CULTURE IN PROGRESS FOR FOUR WEEKS Performed at [facility]    Report Status PENDING  Incomplete  MRSA PCR Screening     Status: None   Collection Time: 08/09/14  7:30 PM  Result Value Ref Range Status   MRSA by PCR NEGATIVE NEGATIVE Final    Comment:        The GeneXpert MRSA Assay (FDA approved for NASAL specimens only), is one component of a comprehensive MRSA colonization surveillance program. It is not intended to diagnose MRSA infection nor to guide or monitor treatment for MRSA infections.     Radiology Reports Dg Chest 2 View  08/06/2014   CLINICAL DATA:  Hemoptysis and chest pain  EXAM: CHEST  2 VIEW  COMPARISON:  None.  FINDINGS: There is focal airspace consolidation in the right middle lobe. Lungs elsewhere clear. Heart size and pulmonary vascularity are normal. No adenopathy. No bone lesions.  IMPRESSION: Right middle lobe pneumonia. Lungs otherwise clear. Followup PA and lateral chest radiographs recommended in 3-4 weeks following trial of antibiotic therapy to ensure resolution and exclude underlying malignancy.  These results will be called to the ordering clinician or representative by the Radiologist Assistant, and communication documented in the PACS or zVision Dashboard.   Electronically Signed   By: Lowella Grip III M.D.   On: 08/06/2014 17:22   Ct Angio Chest Pe W/cm &/or Wo Cm  08/07/2014   CLINICAL DATA:  Right middle lobe pneumonia on chest x-ray, recent hemoptysis  EXAM: CT  ANGIOGRAPHY CHEST WITH CONTRAST  TECHNIQUE: Multidetector CT imaging of the chest was performed using the standard protocol during bolus administration of intravenous contrast. Multiplanar CT image reconstructions and MIPs were obtained to evaluate the vascular anatomy.  CONTRAST:  27m OMNIPAQUE IOHEXOL 350 MG/ML SOLN  COMPARISON:  Chest x-ray from the previous day  FINDINGS: Left lung is well aerated without evidence focal infiltrate or sizable effusion. The right lung is also well aerated and demonstrates some mild infiltrate within the right middle lobe. In the right lower lobe emanating from the hilum inferiorly there is a 4.4 by 3.0 by 3.6 cm peripherally spiculated mass lesion consistent with a primary pulmonary neoplasm. Distal to this there are changes of postobstructive infiltrate. There is occlusion of the lower lobe bronchus as well as significant attenuation of the lower lobe pulmonary artery. Filling defect is identified within the bronchus intermedius consistent with local in growth. Right hilar adenopathy measuring 14 mm in short axis is seen.  The thoracic aorta in its branches are within normal limits. Pulmonary artery demonstrates a normal branching pattern without evidence of pulmonary embolism. Narrowing is noted in the right lower lobe branches consistent with the underlying mass lesion.  Scanning into the upper abdomen demonstrates evidence of a 3.8 cm mass in the left adrenal gland consistent with metastatic disease. The bony structures show patchy lucencies in the thoracic vertebral bodies consistent with metastatic disease.  Review of the MIP images confirms the above findings.  IMPRESSION: Right lower lobe mass lesion with postobstructive changes and invasion into the bronchus intermedius as described consistent with a primary pulmonary neoplasm. There are changes consistent with left adrenal metastatic disease as well as bony metastatic disease.  Mild right middle lobe infiltrate.    Electronically Signed   By: MInez CatalinaM.D.   On: 08/07/2014 14:24   Ct Abdomen Pelvis W  Contrast  08/11/2014   CLINICAL DATA:  Lung cancer. Recent diagnosis of right lower lobe mass with left adrenal nodule and bony metastatic disease.  EXAM: CT ABDOMEN AND PELVIS WITH CONTRAST  TECHNIQUE: Multidetector CT imaging of the abdomen and pelvis was performed using the standard protocol following bolus administration of intravenous contrast.  CONTRAST:  39m OMNIPAQUE IOHEXOL 300 MG/ML  SOLN  COMPARISON:  Chest CT 08/07/2014  FINDINGS: Lower chest: Right lower lobe mass is partially included, there is increased adjacent ground-glass opacity and consolidation, likely related to hemorrhage. No definite right pleural effusion. Minimal atelectasis in the left lower lobe with tiny subpulmonic left pleural effusion.  Hepatobiliary: Intra and extrahepatic biliary ductal dilatation, the common bile duct measures up to 1.7 cm proximally with tapering at the duodenum all insertion. Mild central intra and extrahepatic biliary ductal dilatation. The gallbladder is physiologically distended. Punctate, less than 2 mm hypodensity in the subcapsular right lobe of the liver. There is otherwise no focal hepatic lesion or signs of hepatic metastatic disease.  Pancreas: Normal, no pancreatic ductal dilatation. No focal pancreatic lesion, particularly at the pancreatic head.  Spleen: Normal in size without focal lesion.  Adrenals/Urinary Tract: Heterogeneous left adrenal mass measuring 5.2 x 2.5 cm with central low density component concerning for necrosis. The right adrenal gland appears normal. Kidneys demonstrate symmetric enhancement and excretion. No hydronephrosis. Mm cortical hypodensity in the lower right kidney, too small to characterize.  Stomach/Bowel: Stomach is physiologically distended. There are no dilated or thickened bowel loops. The appendix is normal. Small to moderate volume of colonic stool without colonic wall  thickening.  Vascular/Lymphatic: Small retroperitoneal lymph nodes all measuring less than 3 mm in short axis dimension. No pathologic retroperitoneal, mesenteric, or inguinal adenopathy. The abdominal aorta is normal in caliber.  Reproductive: The uterus is normal for age. There is is 3 cm left ovarian cyst. Physiologic follicles in the right ovary. No adnexal mass.  Bladder:  Physiologically distended.  Other: No ascites.  Musculoskeletal: Osseous metastatic disease involving the left acetabulum measuring 4.0 x 3.7 cm. There is destruction of the medial cortex with soft tissue component and involvement of the superior articular surface. Cortical thinning laterally. Additional multifocal lytic lesions in the left ischium. Lytic lesion in the left femoral neck without definite cortical thinning. Sclerotic density in the right femoral neck. Additional small lucent lesions throughout the bony pelvis. Questionable small lytic lesions in L1 and L2 vertebral bodies.  IMPRESSION: 1. Left adrenal mass measures 5.2 cm with central low density concerning for necrotic metastasis. 2. Osseous metastatic disease. Large destructive lesion involving the left acetabulum with soft tissue component medial and involvement of the superior articular surface. Additional osseous metastasis in the left femoral neck, throughout the bony pelvis, and likely within L1 and L2 vertebral bodies. 3. Intra and extrahepatic biliary ductal dilatation without definable cause. No gallbladder distention or pancreatic ductal dilatation. No obstructing pancreatic lesion. 4. Tiny 2 mm subcapsular hypodensity in the right lobe of the liver, too small to characterize. Small 4 mm hypodensity in the lower right kidney, also too small to characterize.   Electronically Signed   By: MJeb LeveringM.D.   On: 08/11/2014 21:45   Ir Aorta/thoracic  08/10/2014   INDICATION: No known malignancy, now with large (approximately 4.5 x 3.0 cm) spiculated mass within  the superior segment of the right lower lobe. The page underwent attempted bronchoscopic biopsy, however the biopsy could not be performed secondary to active bleeding into the airway.  As such, the patient was intubated for airway protection purposes.  The patient was observed uneventfully overnight in the ICU, though repeat bronchoscopy performed this morning demonstrated persistent bleeding from this infiltrative right lower lobe mass.  As such, request made for bronchial artery embolization in hopes of avoiding potential life threatening hemoptysis.  EXAM: 1. ULTRASOUND GUIDANCE FOR ARTERIAL ACCESS 2. SELECTIVE RIGHT BRONCHIAL ARTERIOGRAM (THIRD ORDER) AND FLUOROSCOPIC GUIDED PERCUTANEOUS PARTICLE EMBOLIZATION.  COMPARISON:  Chest CTA - 08/07/2014  MEDICATIONS: Fentanyl 100 mcg IV; Versed at 3 mg IV  Sedation time: 30 minutes  CONTRAST:  44m OMNIPAQUE IOHEXOL 300 MG/ML  SOLN  FLUOROSCOPY TIME:  6 minutes 36 seconds (3749mGy)  COMPLICATIONS: None immediate  TECHNIQUE: Informed written consent was obtained from the patient's family after a discussion of the risks, benefits and alternatives to treatment. Questions regarding the procedure were encouraged and answered. A timeout was performed prior to the initiation of the procedure.  The right groin was prepped and draped in the usual sterile fashion, and a sterile drape was applied covering the operative field. Maximum barrier sterile technique with sterile gowns and gloves were used for the procedure. A timeout was performed prior to the initiation of the procedure.  The right femoral head was marked fluoroscopically. Under ultrasound guidance, the right common femoral artery was accessed with a micropuncture kit after the overlying soft tissues were anesthetized with 1% lidocaine. An ultrasound image was saved for documentation purposes. The micropuncture sheath was exchanged for a 5 [place] sheath over a Bentson wire. A closure arteriogram was performed  through the side of the sheath confirming access within the right common femoral artery.  Over a Bentson wire, a Mickelson catheter was advanced to the level of the thoracic aorta where it was back bled and flushed. The catheter was then utilized to select the hypertrophied right bronchial artery. A selective right bronchial arteriogram was performed.  With the use of a 0.014 fathom wire, a high-flow Renegade micro catheter was advanced into the distal aspect of the right bronchial artery supplying the superior segment of the right lower lobe. Repeat sub selective arteriogram was performed.  The distal aspect of the inferior segment of the right bronchial artery was then subsequently percutaneously particle embolized with approximately 3/4 of 1 vial of 300-500 micron Embospheres. The micro catheter was retracted into the caudal aspect of the right bronchial artery and a post embolization arteriogram was performed.  Images reviewed and the procedure was terminated. All wires catheters and sheaths were removed from the patient. Hemostasis was achieved at the right groin access site with deployment of an Exoseal closure device. A dressing was placed. The patient tolerated the procedure well without immediate postprocedural complication.  FINDINGS: Selective right bronchial arteriogram demonstrates marked hypertrophy of this vessel as demonstrated on preceding chest CTA.  The caudal tributaries of the right bronchial artery were noted to supply an ill-defined area of hyperemia within the superior medial aspect of the right lower lobe correlating with the spiculated mass seen on preceding chest CT. Following percutaneous selective particle embolization, there is near absence of flow into these abnormal distal vessels supplying the known spiculated mass.  IMPRESSION: Technically successful right bronchial artery percutaneous particle embolization for active bleeding from the known infiltrative mass within the superior  segment of the right lower lobe.   Electronically Signed   By: JSandi MariscalM.D.   On: 08/10/2014 18:18   Ir Angiogram Selective Each Additional Vessel  08/10/2014   INDICATION:  No known malignancy, now with large (approximately 4.5 x 3.0 cm) spiculated mass within the superior segment of the right lower lobe. The page underwent attempted bronchoscopic biopsy, however the biopsy could not be performed secondary to active bleeding into the airway. As such, the patient was intubated for airway protection purposes.  The patient was observed uneventfully overnight in the ICU, though repeat bronchoscopy performed this morning demonstrated persistent bleeding from this infiltrative right lower lobe mass.  As such, request made for bronchial artery embolization in hopes of avoiding potential life threatening hemoptysis.  EXAM: 1. ULTRASOUND GUIDANCE FOR ARTERIAL ACCESS 2. SELECTIVE RIGHT BRONCHIAL ARTERIOGRAM (THIRD ORDER) AND FLUOROSCOPIC GUIDED PERCUTANEOUS PARTICLE EMBOLIZATION.  COMPARISON:  Chest CTA - 08/07/2014  MEDICATIONS: Fentanyl 100 mcg IV; Versed at 3 mg IV  Sedation time: 30 minutes  CONTRAST:  5m OMNIPAQUE IOHEXOL 300 MG/ML  SOLN  FLUOROSCOPY TIME:  6 minutes 36 seconds (3854mGy)  COMPLICATIONS: None immediate  TECHNIQUE: Informed written consent was obtained from the patient's family after a discussion of the risks, benefits and alternatives to treatment. Questions regarding the procedure were encouraged and answered. A timeout was performed prior to the initiation of the procedure.  The right groin was prepped and draped in the usual sterile fashion, and a sterile drape was applied covering the operative field. Maximum barrier sterile technique with sterile gowns and gloves were used for the procedure. A timeout was performed prior to the initiation of the procedure.  The right femoral head was marked fluoroscopically. Under ultrasound guidance, the right common femoral artery was accessed with a  micropuncture kit after the overlying soft tissues were anesthetized with 1% lidocaine. An ultrasound image was saved for documentation purposes. The micropuncture sheath was exchanged for a 5 [place] sheath over a Bentson wire. A closure arteriogram was performed through the side of the sheath confirming access within the right common femoral artery.  Over a Bentson wire, a Mickelson catheter was advanced to the level of the thoracic aorta where it was back bled and flushed. The catheter was then utilized to select the hypertrophied right bronchial artery. A selective right bronchial arteriogram was performed.  With the use of a 0.014 fathom wire, a high-flow Renegade micro catheter was advanced into the distal aspect of the right bronchial artery supplying the superior segment of the right lower lobe. Repeat sub selective arteriogram was performed.  The distal aspect of the inferior segment of the right bronchial artery was then subsequently percutaneously particle embolized with approximately 3/4 of 1 vial of 300-500 micron Embospheres. The micro catheter was retracted into the caudal aspect of the right bronchial artery and a post embolization arteriogram was performed.  Images reviewed and the procedure was terminated. All wires catheters and sheaths were removed from the patient. Hemostasis was achieved at the right groin access site with deployment of an Exoseal closure device. A dressing was placed. The patient tolerated the procedure well without immediate postprocedural complication.  FINDINGS: Selective right bronchial arteriogram demonstrates marked hypertrophy of this vessel as demonstrated on preceding chest CTA.  The caudal tributaries of the right bronchial artery were noted to supply an ill-defined area of hyperemia within the superior medial aspect of the right lower lobe correlating with the spiculated mass seen on preceding chest CT. Following percutaneous selective particle embolization,  there is near absence of flow into these abnormal distal vessels supplying the known spiculated mass.  IMPRESSION: Technically successful right bronchial artery percutaneous particle embolization for active bleeding from  the known infiltrative mass within the superior segment of the right lower lobe.   Electronically Signed   By: Sandi Mariscal M.D.   On: 08/10/2014 18:18   Ir Angiogram Follow Up Study  08/10/2014   INDICATION: No known malignancy, now with large (approximately 4.5 x 3.0 cm) spiculated mass within the superior segment of the right lower lobe. The page underwent attempted bronchoscopic biopsy, however the biopsy could not be performed secondary to active bleeding into the airway. As such, the patient was intubated for airway protection purposes.  The patient was observed uneventfully overnight in the ICU, though repeat bronchoscopy performed this morning demonstrated persistent bleeding from this infiltrative right lower lobe mass.  As such, request made for bronchial artery embolization in hopes of avoiding potential life threatening hemoptysis.  EXAM: 1. ULTRASOUND GUIDANCE FOR ARTERIAL ACCESS 2. SELECTIVE RIGHT BRONCHIAL ARTERIOGRAM (THIRD ORDER) AND FLUOROSCOPIC GUIDED PERCUTANEOUS PARTICLE EMBOLIZATION.  COMPARISON:  Chest CTA - 08/07/2014  MEDICATIONS: Fentanyl 100 mcg IV; Versed at 3 mg IV  Sedation time: 30 minutes  CONTRAST:  19m OMNIPAQUE IOHEXOL 300 MG/ML  SOLN  FLUOROSCOPY TIME:  6 minutes 36 seconds (3237mGy)  COMPLICATIONS: None immediate  TECHNIQUE: Informed written consent was obtained from the patient's family after a discussion of the risks, benefits and alternatives to treatment. Questions regarding the procedure were encouraged and answered. A timeout was performed prior to the initiation of the procedure.  The right groin was prepped and draped in the usual sterile fashion, and a sterile drape was applied covering the operative field. Maximum barrier sterile technique with  sterile gowns and gloves were used for the procedure. A timeout was performed prior to the initiation of the procedure.  The right femoral head was marked fluoroscopically. Under ultrasound guidance, the right common femoral artery was accessed with a micropuncture kit after the overlying soft tissues were anesthetized with 1% lidocaine. An ultrasound image was saved for documentation purposes. The micropuncture sheath was exchanged for a 5 [place] sheath over a Bentson wire. A closure arteriogram was performed through the side of the sheath confirming access within the right common femoral artery.  Over a Bentson wire, a Mickelson catheter was advanced to the level of the thoracic aorta where it was back bled and flushed. The catheter was then utilized to select the hypertrophied right bronchial artery. A selective right bronchial arteriogram was performed.  With the use of a 0.014 fathom wire, a high-flow Renegade micro catheter was advanced into the distal aspect of the right bronchial artery supplying the superior segment of the right lower lobe. Repeat sub selective arteriogram was performed.  The distal aspect of the inferior segment of the right bronchial artery was then subsequently percutaneously particle embolized with approximately 3/4 of 1 vial of 300-500 micron Embospheres. The micro catheter was retracted into the caudal aspect of the right bronchial artery and a post embolization arteriogram was performed.  Images reviewed and the procedure was terminated. All wires catheters and sheaths were removed from the patient. Hemostasis was achieved at the right groin access site with deployment of an Exoseal closure device. A dressing was placed. The patient tolerated the procedure well without immediate postprocedural complication.  FINDINGS: Selective right bronchial arteriogram demonstrates marked hypertrophy of this vessel as demonstrated on preceding chest CTA.  The caudal tributaries of the  right bronchial artery were noted to supply an ill-defined area of hyperemia within the superior medial aspect of the right lower lobe correlating with the spiculated  mass seen on preceding chest CT. Following percutaneous selective particle embolization, there is near absence of flow into these abnormal distal vessels supplying the known spiculated mass.  IMPRESSION: Technically successful right bronchial artery percutaneous particle embolization for active bleeding from the known infiltrative mass within the superior segment of the right lower lobe.   Electronically Signed   By: Sandi Mariscal M.D.   On: 08/10/2014 18:18   Ir US Guide Vasc Access Right  08/10/2014   INDICATION: No known malignancy, now with large (approximately 4.5 x 3.0 cm) spiculated mass within the superior segment of the right lower lobe. The page underwent attempted bronchoscopic biopsy, however the biopsy could not be performed secondary to active bleeding into the airway. As such, the patient was intubated for airway protection purposes.  The patient was observed uneventfully overnight in the ICU, though repeat bronchoscopy performed this morning demonstrated persistent bleeding from this infiltrative right lower lobe mass.  As such, request made for bronchial artery embolization in hopes of avoiding potential life threatening hemoptysis.  EXAM: 1. ULTRASOUND GUIDANCE FOR ARTERIAL ACCESS 2. SELECTIVE RIGHT BRONCHIAL ARTERIOGRAM (THIRD ORDER) AND FLUOROSCOPIC GUIDED PERCUTANEOUS PARTICLE EMBOLIZATION.  COMPARISON:  Chest CTA - 08/07/2014  MEDICATIONS: Fentanyl 100 mcg IV; Versed at 3 mg IV  Sedation time: 30 minutes  CONTRAST:  19m OMNIPAQUE IOHEXOL 300 MG/ML  SOLN  FLUOROSCOPY TIME:  6 minutes 36 seconds (3425mGy)  COMPLICATIONS: None immediate  TECHNIQUE: Informed written consent was obtained from the patient's family after a discussion of the risks, benefits and alternatives to treatment. Questions regarding the procedure were encouraged  and answered. A timeout was performed prior to the initiation of the procedure.  The right groin was prepped and draped in the usual sterile fashion, and a sterile drape was applied covering the operative field. Maximum barrier sterile technique with sterile gowns and gloves were used for the procedure. A timeout was performed prior to the initiation of the procedure.  The right femoral head was marked fluoroscopically. Under ultrasound guidance, the right common femoral artery was accessed with a micropuncture kit after the overlying soft tissues were anesthetized with 1% lidocaine. An ultrasound image was saved for documentation purposes. The micropuncture sheath was exchanged for a 5 [place] sheath over a Bentson wire. A closure arteriogram was performed through the side of the sheath confirming access within the right common femoral artery.  Over a Bentson wire, a Mickelson catheter was advanced to the level of the thoracic aorta where it was back bled and flushed. The catheter was then utilized to select the hypertrophied right bronchial artery. A selective right bronchial arteriogram was performed.  With the use of a 0.014 fathom wire, a high-flow Renegade micro catheter was advanced into the distal aspect of the right bronchial artery supplying the superior segment of the right lower lobe. Repeat sub selective arteriogram was performed.  The distal aspect of the inferior segment of the right bronchial artery was then subsequently percutaneously particle embolized with approximately 3/4 of 1 vial of 300-500 micron Embospheres. The micro catheter was retracted into the caudal aspect of the right bronchial artery and a post embolization arteriogram was performed.  Images reviewed and the procedure was terminated. All wires catheters and sheaths were removed from the patient. Hemostasis was achieved at the right groin access site with deployment of an Exoseal closure device. A dressing was placed. The  patient tolerated the procedure well without immediate postprocedural complication.  FINDINGS: Selective right bronchial arteriogram demonstrates marked  hypertrophy of this vessel as demonstrated on preceding chest CTA.  The caudal tributaries of the right bronchial artery were noted to supply an ill-defined area of hyperemia within the superior medial aspect of the right lower lobe correlating with the spiculated mass seen on preceding chest CT. Following percutaneous selective particle embolization, there is near absence of flow into these abnormal distal vessels supplying the known spiculated mass.  IMPRESSION: Technically successful right bronchial artery percutaneous particle embolization for active bleeding from the known infiltrative mass within the superior segment of the right lower lobe.   Electronically Signed   By: Sandi Mariscal M.D.   On: 08/10/2014 18:18   Dg Chest Port 1 View  08/11/2014   CLINICAL DATA:  History of thumb opt this cysts  EXAM: PORTABLE CHEST - 1 VIEW  COMPARISON:  Portable chest x-ray of 08/10/2014 and CT chest of 08/07/2014  FINDINGS: The endotracheal tube has been removed. There is persistent opacity overlying the right hilum and infrahilar region consistent with the patient's known carcinoma. Mild left basilar linear atelectasis is present. Heart size is stable.  IMPRESSION: Endotracheal tube removed. No change in right hilar -infrahilar opacity consistent with mass by CT.   Electronically Signed   By: Ivar Drape M.D.   On: 08/11/2014 08:02   Dg Chest Port 1 View  08/10/2014   CLINICAL DATA:  Hemoptysis  EXAM: PORTABLE CHEST - 1 VIEW  COMPARISON:  08/09/2014  FINDINGS: An endotracheal tube and nasogastric catheter are noted in satisfactory position. The cardiac shadow is within normal limits. The lungs are well aerated bilaterally. Right perihilar mass lesion is again identified similar to that noted on previous chest x-ray and CT. No pneumothorax is identified.  IMPRESSION:  Stable right perihilar mass lesion.  Tubes and lines as described.   Electronically Signed   By: Inez Catalina M.D.   On: 08/10/2014 08:13   Dg Chest Port 1 View  08/09/2014   CLINICAL DATA:  Intubation, status post bronchoscopy  EXAM: PORTABLE CHEST - 1 VIEW  COMPARISON:  08/06/2014  FINDINGS: Right hilar/right lower lobe mass reidentified. Endotracheal tube is appropriately positioned. Heart size upper limits of normal. No pneumothorax. No pleural effusion. Curvilinear left lower lobe atelectasis or scarring.  IMPRESSION: No pneumothorax after bronchoscopy.   Electronically Signed   By: Conchita Paris M.D.   On: 08/09/2014 16:27   [place] Guide Roadmapping  08/10/2014   INDICATION: No known malignancy, now with large (approximately 4.5 x 3.0 cm) spiculated mass within the superior segment of the right lower lobe. The page underwent attempted bronchoscopic biopsy, however the biopsy could not be performed secondary to active bleeding into the airway. As such, the patient was intubated for airway protection purposes.  The patient was observed uneventfully overnight in the ICU, though repeat bronchoscopy performed this morning demonstrated persistent bleeding from this infiltrative right lower lobe mass.  As such, request made for bronchial artery embolization in hopes of avoiding potential life threatening hemoptysis.  EXAM: 1. ULTRASOUND GUIDANCE FOR ARTERIAL ACCESS 2. SELECTIVE RIGHT BRONCHIAL ARTERIOGRAM (THIRD ORDER) AND FLUOROSCOPIC GUIDED PERCUTANEOUS PARTICLE EMBOLIZATION.  COMPARISON:  Chest CTA - 08/07/2014  MEDICATIONS: Fentanyl 100 mcg IV; Versed at 3 mg IV  Sedation time: 30 minutes  CONTRAST:  41m OMNIPAQUE IOHEXOL 300 MG/ML  SOLN  FLUOROSCOPY TIME:  6 minutes 36 seconds (3735mGy)  COMPLICATIONS: None immediate  TECHNIQUE: Informed written consent was obtained from the patient's family after  a discussion of the risks, benefits and alternatives to treatment.  Questions regarding the procedure were encouraged and answered. A timeout was performed prior to the initiation of the procedure.  The right groin was prepped and draped in the usual sterile fashion, and a sterile drape was applied covering the operative field. Maximum barrier sterile technique with sterile gowns and gloves were used for the procedure. A timeout was performed prior to the initiation of the procedure.  The right femoral head was marked fluoroscopically. Under ultrasound guidance, the right common femoral artery was accessed with a micropuncture kit after the overlying soft tissues were anesthetized with 1% lidocaine. An ultrasound image was saved for documentation purposes. The micropuncture sheath was exchanged for a 5 [place] vascular sheath over a Bentson wire. A closure arteriogram was performed through the side of the sheath confirming access within the right common femoral artery.  Over a Bentson wire, a Mickelson catheter was advanced to the level of the thoracic aorta where it was back bled and flushed. The catheter was then utilized to select the hypertrophied right bronchial artery. A selective right bronchial arteriogram was performed.  With the use of a 0.014 fathom wire, a high-flow Renegade micro catheter was advanced into the distal aspect of the right bronchial artery supplying the superior segment of the right lower lobe. Repeat sub selective arteriogram was performed.  The distal aspect of the inferior segment of the right bronchial artery was then subsequently percutaneously particle embolized with approximately 3/4 of 1 vial of 300-500 micron Embospheres. The micro catheter was retracted into the caudal aspect of the right bronchial artery and a post embolization arteriogram was performed.  Images reviewed and the procedure was terminated. All wires catheters and sheaths were removed from the patient. Hemostasis was achieved at the right groin access site with deployment of an  Exoseal closure device. A dressing was placed. The patient tolerated the procedure well without immediate postprocedural complication.  FINDINGS: Selective right bronchial arteriogram demonstrates marked hypertrophy of this vessel as demonstrated on preceding chest CTA.  The caudal tributaries of the right bronchial artery were noted to supply an ill-defined area of hyperemia within the superior medial aspect of the right lower lobe correlating with the spiculated mass seen on preceding chest CT. Following percutaneous selective particle embolization, there is near absence of flow into these abnormal distal vessels supplying the known spiculated mass.  IMPRESSION: Technically successful right bronchial artery percutaneous particle embolization for active bleeding from the known infiltrative mass within the superior segment of the right lower lobe.   Electronically Signed   By: Sandi Mariscal M.D.   On: 08/10/2014 18:18     CBC  Recent Labs Lab 08/07/14 1300 08/08/14 0458 08/10/14 0254 08/11/14 0218 08/12/14 0537  WBC 12.6* 11.6* 16.8* 13.2* 18.7*  HGB 13.0 11.5* 11.2* 10.9* 11.6*  HCT 38.9 34.7* 33.4* 32.0* 34.1*  PLT 562* 483* 444* 393 399  MCV 91.3 91.6 92.8 92.2 90.9  MCH 30.5 30.3 31.1 31.4 30.9  MCHC 33.4 33.1 33.5 34.1 34.0  RDW 13.0 12.9 13.1 12.9 13.1  LYMPHSABS 2.7  --   --  1.5  --   MONOABS 0.9  --   --  1.0  --   EOSABS 0.1  --   --  0.0  --   BASOSABS 0.1  --   --  0.0  --     Chemistries   Recent Labs Lab 08/07/14 1300 08/08/14 0458 08/09/14 0448 08/10/14 0254 08/11/14 3557  08/12/14 0537  NA 137 138  --  135 139 136  K 3.7 3.4* 3.7 3.8 3.3* 3.2*  CL 103 106  --  103 108 101  CO2 25 25  --  _0 GLUCOSE 89 98  --  93 101* 108*  BUN 6 5*  --  <5* <5* <5*  CREATININE 0.55 0.46  --  0.46 0.44 0.42*  CALCIUM 8.8* 8.5*  --  8.3* 8.2* 8.2*  AST  --  16  --   --   --   --   ALT  --  14  --   --   --   --   ALKPHOS  --  145*  --   --   --   --   BILITOT  --  0.5   --   --   --   --    ------------------------------------------------------------------------------------------------------------------ estimated creatinine clearance is 62.5 mL/min (by C-G formula based on Cr of 0.42). ------------------------------------------------------------------------------------------------------------------ No results for input(s): HGBA1C in the last 72 hours. ------------------------------------------------------------------------------------------------------------------  Recent Labs  08/09/14 1746  TRIG 90   ------------------------------------------------------------------------------------------------------------------ No results for input(s): TSH, T4TOTAL, T3FREE, THYROIDAB in the last 72 hours.  Invalid input(s): FREET3 ------------------------------------------------------------------------------------------------------------------ No results for input(s): VITAMINB12, FOLATE, FERRITIN, TIBC, IRON, RETICCTPCT in the last 72 hours.  Coagulation profile  Recent Labs Lab 08/10/14 1150  INR 1.17    No results for input(s): DDIMER in the last 72 hours.  Cardiac Enzymes No results for input(s): CKMB, TROPONINI, MYOGLOBIN in the last 168 hours.  Invalid input(s): CK ------------------------------------------------------------------------------------------------------------------ Invalid input(s): POCBNP   Time Spent in minutes  35   SINGH,PRASHANT K M.D on 08/12/2014 at 11:03 AM  Between 7am to 7pm - Pager - [phone]  After 7pm go to [email or web address] - password [facility]  [facility] -  Office  [phone]

## 2014-08-12 NOTE — Progress Notes (Signed)
Utilization Review completed. Abbee Cremeens RN BSN CM 

## 2014-08-13 ENCOUNTER — Inpatient Hospital Stay (HOSPITAL_COMMUNITY): Payer: No Typology Code available for payment source

## 2014-08-13 LAB — CBC
HCT: 32.1 % — ABNORMAL LOW (ref 36.0–46.0)
HEMOGLOBIN: 10.9 g/dL — AB (ref 12.0–15.0)
MCH: 30.9 pg (ref 26.0–34.0)
MCHC: 34 g/dL (ref 30.0–36.0)
MCV: 90.9 fL (ref 78.0–100.0)
Platelets: 432 10*3/uL — ABNORMAL HIGH (ref 150–400)
RBC: 3.53 MIL/uL — ABNORMAL LOW (ref 3.87–5.11)
RDW: 13.1 % (ref 11.5–15.5)
WBC: 19.3 10*3/uL — ABNORMAL HIGH (ref 4.0–10.5)

## 2014-08-13 LAB — MAGNESIUM: MAGNESIUM: 1.8 mg/dL (ref 1.7–2.4)

## 2014-08-13 LAB — EXPECTORATED SPUTUM ASSESSMENT W REFEX TO RESP CULTURE

## 2014-08-13 LAB — URINE MICROSCOPIC-ADD ON

## 2014-08-13 LAB — POTASSIUM: Potassium: 3.2 mmol/L — ABNORMAL LOW (ref 3.5–5.1)

## 2014-08-13 LAB — URINALYSIS, ROUTINE W REFLEX MICROSCOPIC
BILIRUBIN URINE: NEGATIVE
GLUCOSE, UA: NEGATIVE mg/dL
Ketones, ur: 15 mg/dL — AB
Leukocytes, UA: NEGATIVE
Nitrite: NEGATIVE
PH: 6.5 (ref 5.0–8.0)
PROTEIN: NEGATIVE mg/dL
Specific Gravity, Urine: 1.005 (ref 1.005–1.030)
Urobilinogen, UA: 0.2 mg/dL (ref 0.0–1.0)

## 2014-08-13 LAB — EXPECTORATED SPUTUM ASSESSMENT W GRAM STAIN, RFLX TO RESP C

## 2014-08-13 MED ORDER — BOOST / RESOURCE BREEZE PO LIQD
1.0000 | Freq: Two times a day (BID) | ORAL | Status: DC
  Administered 2014-08-13 – 2014-08-17 (×4): 1 via ORAL

## 2014-08-13 MED ORDER — PIPERACILLIN-TAZOBACTAM 3.375 G IVPB
3.3750 g | Freq: Three times a day (TID) | INTRAVENOUS | Status: DC
  Administered 2014-08-13 – 2014-08-17 (×12): 3.375 g via INTRAVENOUS
  Filled 2014-08-13 (×14): qty 50

## 2014-08-13 MED ORDER — MAGNESIUM SULFATE IN D5W 10-5 MG/ML-% IV SOLN
1.0000 g | Freq: Once | INTRAVENOUS | Status: AC
  Administered 2014-08-13: 1 g via INTRAVENOUS
  Filled 2014-08-13: qty 100

## 2014-08-13 MED ORDER — POTASSIUM CHLORIDE CRYS ER 20 MEQ PO TBCR
40.0000 meq | EXTENDED_RELEASE_TABLET | ORAL | Status: AC
  Administered 2014-08-13 (×2): 40 meq via ORAL
  Filled 2014-08-13 (×2): qty 2

## 2014-08-13 MED ORDER — VANCOMYCIN HCL IN DEXTROSE 750-5 MG/150ML-% IV SOLN
750.0000 mg | Freq: Two times a day (BID) | INTRAVENOUS | Status: DC
  Administered 2014-08-14 – 2014-08-17 (×7): 750 mg via INTRAVENOUS
  Filled 2014-08-13 (×8): qty 150

## 2014-08-13 MED ORDER — VANCOMYCIN HCL IN DEXTROSE 1-5 GM/200ML-% IV SOLN
1000.0000 mg | Freq: Once | INTRAVENOUS | Status: AC
  Administered 2014-08-13: 1000 mg via INTRAVENOUS
  Filled 2014-08-13: qty 200

## 2014-08-13 MED ORDER — GUAIFENESIN-CODEINE 100-10 MG/5ML PO SOLN
10.0000 mL | ORAL | Status: DC | PRN
  Administered 2014-08-13 – 2014-08-17 (×14): 10 mL via ORAL
  Filled 2014-08-13 (×16): qty 10

## 2014-08-13 NOTE — Progress Notes (Signed)
ANTIBIOTIC CONSULT NOTE - INITIAL  Pharmacy Consult:  Vancomycin / Zosyn Indication:  PNA  No Known Allergies  Patient Measurements: Height: '4\' 11"'$  (149.9 cm) Weight: 114 lb 3.2 oz (51.8 kg) IBW/kg (Calculated) : 43.2  Vital Signs: Temp: 99.1 F (37.3 C) (07/29 0705) Temp Source: Oral (07/29 0705) BP: 108/57 mmHg (07/29 0502) Pulse Rate: 103 (07/29 0502) Intake/Output from previous day: 07/28 0701 - 07/29 0700 In: 320 [P.O.:320] Out: 1400 [Urine:1400]  Labs:  Recent Labs  08/11/14 0218 08/12/14 0537 08/13/14 0551  WBC 13.2* 18.7* 19.3*  HGB 10.9* 11.6* 10.9*  PLT 393 399 432*  CREATININE 0.44 0.42*  --    Estimated Creatinine Clearance: 62.5 mL/min (by C-G formula based on Cr of 0.42). No results for input(s): VANCOTROUGH, VANCOPEAK, VANCORANDOM, GENTTROUGH, GENTPEAK, GENTRANDOM, TOBRATROUGH, TOBRAPEAK, TOBRARND, AMIKACINPEAK, AMIKACINTROU, AMIKACIN in the last 72 hours.   Microbiology: Recent Results (from the past 720 hour(s))  Culture, blood (routine x 2)     Status: None   Collection Time: 08/07/14  5:10 PM  Result Value Ref Range Status   Specimen Description BLOOD RIGHT HAND  Final   Special Requests BOTTLES DRAWN AEROBIC AND ANAEROBIC 10CC  Final   Culture NO GROWTH 5 DAYS  Final   Report Status 08/12/2014 FINAL  Final  Culture, blood (routine x 2)     Status: None   Collection Time: 08/07/14  5:13 PM  Result Value Ref Range Status   Specimen Description BLOOD LEFT WRIST  Final   Special Requests BOTTLES DRAWN AEROBIC AND ANAEROBIC 10CC  Final   Culture NO GROWTH 5 DAYS  Final   Report Status 08/12/2014 FINAL  Final  AFB culture with smear     Status: None (Preliminary result)   Collection Time: 08/09/14  3:30 PM  Result Value Ref Range Status   Specimen Description BRONCHIAL WASHINGS  Final   Special Requests Normal  Final   Acid Fast Smear   Final    NO ACID FAST BACILLI SEEN Performed at [facility]    Culture   Final    CULTURE WILL  BE EXAMINED FOR 6 WEEKS BEFORE ISSUING A FINAL REPORT Performed at [facility]    Report Status PENDING  Incomplete  Fungus Culture with Smear     Status: None (Preliminary result)   Collection Time: 08/09/14  3:30 PM  Result Value Ref Range Status   Specimen Description BRONCHIAL WASHINGS  Final   Special Requests Normal  Final   Fungal Smear   Final    NO YEAST OR FUNGAL ELEMENTS SEEN Performed at [facility]    Culture   Final    CULTURE IN PROGRESS FOR FOUR WEEKS Performed at [facility]    Report Status PENDING  Incomplete  MRSA PCR Screening     Status: None   Collection Time: 08/09/14  7:30 PM  Result Value Ref Range Status   MRSA by PCR NEGATIVE NEGATIVE Final    Comment:        The GeneXpert MRSA Assay (FDA approved for NASAL specimens only), is one component of a comprehensive MRSA colonization surveillance program. It is not intended to diagnose MRSA infection nor to guide or monitor treatment for MRSA infections.     Medical History: Past Medical History  Diagnosis Date  . Cancer      Assessment: 41 YOF presented on 08/07/14 with hemoptysis and was started on Unasyn for possible PNA.  Imaging revealed RLL mass and adrenal/bony  metastatic disease.  Pharmacy consulted to transition patient to vancomycin and Zosyn to cover for HCAP in setting of worsening leukocytosis and fever.  Her renal function has been stable.  Vanc 7/29 >> Zosyn 7/29 >> Unasyn 7/24 >> 7/28  7/23 BCx x2 - negative 7/25 bronchial washing AFB cx - pending 7/25 bronchial washing fungus cx - pending 7/29 UCx -    Goal of Therapy:  Vancomycin trough level 15-20 mcg/ml   Plan:  - Vanc 1gm IV x 1, then '750mg'$  IV Q12H - Zosyn 3.375gm IV Q8H, 4 hr infusion - Monitor renal fxn, clinical progress, vanc trough as indicated    Kahliya Fraleigh D. Mina Marble, PharmD, BCPS Pager:  [phone] 08/13/2014, 11:56 AM

## 2014-08-13 NOTE — Progress Notes (Signed)
Nutrition Follow-up  DOCUMENTATION CODES:   Not applicable  INTERVENTION:   Boost Breeze po BID, each supplement provides 250 kcal and 9 grams of protein  NEW NUTRITION DIAGNOSIS:   Increased nutrient needs related to catabolic illness as evidenced by estimated needs, ongoing  GOAL:   Patient will meet greater than or equal to 90% of their needs, progressing  MONITOR:   PO intake, Supplement acceptance, Labs, Weight trends, I & O's  ASSESSMENT:   [name] non-smoker presented with cough x 1 week, possibly hemoptysis. CXR suggested pneumonia but CT shows RLL mass with mets to left adrenal and thoracic vertebrae.  Patient s/p procedure 7/25: BRONCHOSCOPY  S/p bronchial artery embolization of RLL per IR.  Pt extubated 7/26 PM.  On cell phone upon visitation.  Currently on a Heart Healthy diet.  PO intake 75-100% per flowsheet records.  Nutrient needs increased given catabolic illness.  RD to order oral nutrition supplements.  Nutrition-Focused physical exam completed.  No muscle or subcutaneous fat depletion noticed.  Diet Order:  Diet Heart Room service appropriate?: Yes; Fluid consistency:: Thin  Skin:  Reviewed, no issues  Last BM:  7/27  Height:   Ht Readings from Last 1 Encounters:  08/09/14 '4\' 11"'$  (1.499 m)    Weight:   Wt Readings from Last 1 Encounters:  08/11/14 114 lb 3.2 oz (51.8 kg)    Ideal Body Weight:  44.6 kg  BMI:  Body mass index is 23.05 kg/(m^2).  Estimated Nutritional Needs:   Kcal:  1600-1800  Protein:  80-90 gm  Fluid:  1.6-1.8 L  EDUCATION NEEDS:   No education needs identified at this time   Arthur Holms, RD, LDN Pager #: [phone] After-Hours Pager #: [phone]

## 2014-08-13 NOTE — Progress Notes (Signed)
Patient Demographics:    Deborah Foster, is a 44 y.o. female, DOB - October 28, 1970, [id number]  Admit date - 08/07/2014   Admitting Physician Oswald Hillock, MD  Outpatient Primary MD for the patient is [name], MD  LOS - 6   Chief Complaint  Patient presents with  . Cough        Subjective:    Deborah Foster today has, No headache, No chest pain, No abdominal pain - No Nausea, No new weakness tingling or numbness, mild  Cough - but no SOB.  No frank hemoptysis.   Assessment  & Plan :     1. 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   She was extubated on 08/11/2014. No biopsies were done due to ongoing bleeding during bronchoscopy. CT abdomen and pelvis showed left adrenal mass and currently I has been requested to biopsy that lesion. IR wants to biopsy once leukocytosis has improved as there is suspicion for a brewing infection.  So far clinically this looks like primary bronchogenic carcinoma with metastasis to left adrenal gland, thoracic vertebra, left hip along with possibly kidney.   2. Possible postobstructive pneumonia could be HCAP . Worsening leukocytosis, persistent infiltrate, low-grade fevers, did have bronchoscopy with endotracheal intubation and mechanical ventilation. Will stop Unasyn and start HCAP anti-biotics. Monitor clinically. Repeat sputum culture, UA also ordered for leukocytosis evaluation.   3. Hypokalemia. Replaced. We will check again in the morning with magnesium levels.    Code Status  : Full  Family Communication  : none present  Disposition Plan  : Home  2 days  Consults  :  PCCM, IR  Procedures  :   CT chest 7/23 > Right lower lobe mass lesion with postobstructive changes and invasion into the bronchus intermedius. changes consistent with left adrenal metastatic disease as well as bony metastatic disease. Mild right middle lobe infiltrate.  7/25 bronchoscopy> large clot in the right lower lobe, significant bleeding with suctioning, bleeding controlled; intubated for airway protection, ICU monitoring.  7/26 bronchoscopy> minor bleeding noted with suctioning, resolved spontaneously; IR bronchial artery embolization; Extubated post procedure  CT Ab/Pelvis 7/27 > left adrenal mass, left acetabular metastatic lesion. Intra-and extrahepatic duct dilation.  CT-guided tissue biopsy ordered for 08/12/2014  DVT Prophylaxis  :    SCDs   Lab Results  Component Value Date   PLT 432* 08/13/2014    Inpatient Medications  Scheduled Meds: . feeding supplement  1 Container Oral BID BM  . guaiFENesin  600 mg Oral BID  . lactobacillus acidophilus  2 tablet Oral TID   Continuous Infusions:   PRN Meds:.acetaminophen **OR** acetaminophen, albuterol, guaiFENesin-codeine, ondansetron **OR** ondansetron (ZOFRAN) IV  Antibiotics  :     Anti-infectives    Start     Dose/Rate Route Frequency Ordered Stop   08/08/14 1600  azithromycin (ZITHROMAX) 500 mg in dextrose 5 % 250 mL IVPB  Status:  Discontinued     500 mg 250 mL/hr over 60 Minutes Intravenous  Every 24 hours 08/07/14 1639 08/08/14 1052   08/08/14 1500  cefTRIAXone (ROCEPHIN) 1 g in dextrose 5 % 50 mL IVPB - Premix  Status:  Discontinued     1 g 100 mL/hr over 30 Minutes Intravenous Every 24 hours 08/07/14 1654 08/08/14 1052   08/08/14 1200  ampicillin-sulbactam (UNASYN) 1.5 g in sodium chloride 0.9 % 50 mL IVPB  Status:  Discontinued     1.5 g 100 mL/hr over 30 Minutes Intravenous Every 6 hours 08/08/14 1124 08/13/14  1138   08/07/14 1515  cefTRIAXone (ROCEPHIN) 1 g in dextrose 5 % 50 mL IVPB     1 g 100 mL/hr over 30 Minutes Intravenous  Once 08/07/14 1501 08/07/14 1549   08/07/14 1515  azithromycin (ZITHROMAX) 500 mg in dextrose 5 % 250 mL IVPB     500 mg 250 mL/hr over 60 Minutes Intravenous  Once 08/07/14 1501 08/07/14 1721        Objective:   Filed Vitals:   08/12/14 1648 08/12/14 2152 08/13/14 0502 08/13/14 0705  BP: 102/60 94/45 108/57   Pulse: 110 101 103   Temp:  99.1 F (37.3 C) 100.4 F (38 C) 99.1 F (37.3 C)  TempSrc:  Oral Oral Oral  Resp:  18 18   Height:      Weight:      SpO2: 95% 98% 97%     Wt Readings from Last 3 Encounters:  08/11/14 51.8 kg (114 lb 3.2 oz)     Intake/Output Summary (Last 24 hours) at 08/13/14 1138 Last data filed at 08/13/14 0418  Gross per 24 hour  Intake    320 ml  Output    700 ml  Net   -380 ml     Physical Exam  Awake Alert, Oriented X 3, No new F.N deficits, Normal affect [place].AT,PERRAL Supple Neck,No JVD, No cervical lymphadenopathy appriciated.  Symmetrical Chest wall movement, Good air movement bilaterally, CTAB RRR,No Gallops,Rubs or new Murmurs, No Parasternal Heave +ve B.Sounds, Abd Soft, No tenderness, No organomegaly appriciated, No rebound - guarding or rigidity. No Cyanosis, Clubbing or edema, No new Rash or bruise       Data Review:   Micro Results Recent Results (from the past 240 hour(s))  Culture, blood (routine x 2)     Status: None   Collection Time: 08/07/14  5:10 PM  Result Value Ref Range Status   Specimen Description BLOOD RIGHT HAND  Final   Special Requests BOTTLES DRAWN AEROBIC AND ANAEROBIC 10CC  Final   Culture NO GROWTH 5 DAYS  Final   Report Status 08/12/2014 FINAL  Final  Culture, blood (routine x 2)     Status: None   Collection Time: 08/07/14  5:13 PM  Result Value Ref Range Status   Specimen Description BLOOD LEFT WRIST  Final   Special Requests BOTTLES DRAWN AEROBIC AND ANAEROBIC 10CC   Final   Culture NO GROWTH 5 DAYS  Final   Report Status 08/12/2014 FINAL  Final  AFB culture with smear     Status: None (Preliminary result)   Collection Time: 08/09/14  3:30 PM  Result Value Ref Range Status   Specimen Description BRONCHIAL WASHINGS  Final   Special Requests Normal  Final   Acid Fast Smear   Final    NO ACID FAST BACILLI SEEN Performed at [facility]    Culture   Final    CULTURE WILL BE EXAMINED FOR 6 WEEKS BEFORE ISSUING A FINAL REPORT Performed at  [facility]    Report Status PENDING  Incomplete  Fungus Culture with Smear     Status: None (Preliminary result)   Collection Time: 08/09/14  3:30 PM  Result Value Ref Range Status   Specimen Description BRONCHIAL WASHINGS  Final   Special Requests Normal  Final   Fungal Smear   Final    NO YEAST OR FUNGAL ELEMENTS SEEN Performed at [facility]    Culture   Final    CULTURE IN PROGRESS FOR FOUR WEEKS Performed at [facility]    Report Status PENDING  Incomplete  MRSA PCR Screening     Status: None   Collection Time: 08/09/14  7:30 PM  Result Value Ref Range Status   MRSA by PCR NEGATIVE NEGATIVE Final    Comment:        The GeneXpert MRSA Assay (FDA approved for NASAL specimens only), is one component of a comprehensive MRSA colonization surveillance program. It is not intended to diagnose MRSA infection nor to guide or monitor treatment for MRSA infections.     Radiology Reports Dg Chest 2 View  08/13/2014   CLINICAL DATA:  44 year old female with shortness of breath and hemoptysis for almost 1 week. Known right lower lobe mass. Status post recent right bronchial artery embolization on 08/10/2014.  EXAM: CHEST  2 VIEW  COMPARISON:  Recent prior chest x-ray 08/11/2014  FINDINGS: Masslike opacity in the right lower lobe again demonstrated without significant interval change. Ill-defined airspace opacity in the periphery of the lesion likely consistent with a  combination of postobstructive changes and possibly alveolar hemorrhage. The left lung remains relatively clear. The cardiac and mediastinal contours are unchanged. No acute osseous abnormality. Overall, very similar appearance of the chest. No new pneumothorax or pleural effusion.  IMPRESSION: Similar appearance of known right lower lobe pulmonary mass and surrounding airspace opacity likely reflecting a combination of postobstructive change and alveolar hemorrhage.  No significant interval change.   Electronically Signed   By: Jacqulynn Cadet M.D.   On: 08/13/2014 08:04   Dg Chest 2 View  08/06/2014   CLINICAL DATA:  Hemoptysis and chest pain  EXAM: CHEST  2 VIEW  COMPARISON:  None.  FINDINGS: There is focal airspace consolidation in the right middle lobe. Lungs elsewhere clear. Heart size and pulmonary vascularity are normal. No adenopathy. No bone lesions.  IMPRESSION: Right middle lobe pneumonia. Lungs otherwise clear. Followup PA and lateral chest radiographs recommended in 3-4 weeks following trial of antibiotic therapy to ensure resolution and exclude underlying malignancy.  These results will be called to the ordering clinician or representative by the Radiologist Assistant, and communication documented in the PACS or zVision Dashboard.   Electronically Signed   By: Lowella Grip III M.D.   On: 08/06/2014 17:22   Ct Angio Chest Pe W/cm &/or Wo Cm  08/07/2014   CLINICAL DATA:  Right middle lobe pneumonia on chest x-ray, recent hemoptysis  EXAM: CT ANGIOGRAPHY CHEST WITH CONTRAST  TECHNIQUE: Multidetector CT imaging of the chest was performed using the standard protocol during bolus administration of intravenous contrast. Multiplanar CT image reconstructions and MIPs were obtained to evaluate the vascular anatomy.  CONTRAST:  14mL OMNIPAQUE IOHEXOL 350 MG/ML SOLN  COMPARISON:  Chest x-ray from the previous day  FINDINGS: Left lung is well aerated without evidence focal infiltrate or sizable  effusion. The right lung is also well aerated and demonstrates some mild infiltrate within the right middle lobe. In the right lower  lobe emanating from the hilum inferiorly there is a 4.4 by 3.0 by 3.6 cm peripherally spiculated mass lesion consistent with a primary pulmonary neoplasm. Distal to this there are changes of postobstructive infiltrate. There is occlusion of the lower lobe bronchus as well as significant attenuation of the lower lobe pulmonary artery. Filling defect is identified within the bronchus intermedius consistent with local in growth. Right hilar adenopathy measuring 14 mm in short axis is seen.  The thoracic aorta in its branches are within normal limits. Pulmonary artery demonstrates a normal branching pattern without evidence of pulmonary embolism. Narrowing is noted in the right lower lobe branches consistent with the underlying mass lesion.  Scanning into the upper abdomen demonstrates evidence of a 3.8 cm mass in the left adrenal gland consistent with metastatic disease. The bony structures show patchy lucencies in the thoracic vertebral bodies consistent with metastatic disease.  Review of the MIP images confirms the above findings.  IMPRESSION: Right lower lobe mass lesion with postobstructive changes and invasion into the bronchus intermedius as described consistent with a primary pulmonary neoplasm. There are changes consistent with left adrenal metastatic disease as well as bony metastatic disease.  Mild right middle lobe infiltrate.   Electronically Signed   By: Inez Catalina M.D.   On: 08/07/2014 14:24   Ct Abdomen Pelvis W Contrast  08/11/2014   CLINICAL DATA:  Lung cancer. Recent diagnosis of right lower lobe mass with left adrenal nodule and bony metastatic disease.  EXAM: CT ABDOMEN AND PELVIS WITH CONTRAST  TECHNIQUE: Multidetector CT imaging of the abdomen and pelvis was performed using the standard protocol following bolus administration of intravenous contrast.  CONTRAST:   58mL OMNIPAQUE IOHEXOL 300 MG/ML  SOLN  COMPARISON:  Chest CT 08/07/2014  FINDINGS: Lower chest: Right lower lobe mass is partially included, there is increased adjacent ground-glass opacity and consolidation, likely related to hemorrhage. No definite right pleural effusion. Minimal atelectasis in the left lower lobe with tiny subpulmonic left pleural effusion.  Hepatobiliary: Intra and extrahepatic biliary ductal dilatation, the common bile duct measures up to 1.7 cm proximally with tapering at the duodenum all insertion. Mild central intra and extrahepatic biliary ductal dilatation. The gallbladder is physiologically distended. Punctate, less than 2 mm hypodensity in the subcapsular right lobe of the liver. There is otherwise no focal hepatic lesion or signs of hepatic metastatic disease.  Pancreas: Normal, no pancreatic ductal dilatation. No focal pancreatic lesion, particularly at the pancreatic head.  Spleen: Normal in size without focal lesion.  Adrenals/Urinary Tract: Heterogeneous left adrenal mass measuring 5.2 x 2.5 cm with central low density component concerning for necrosis. The right adrenal gland appears normal. Kidneys demonstrate symmetric enhancement and excretion. No hydronephrosis. Mm cortical hypodensity in the lower right kidney, too small to characterize.  Stomach/Bowel: Stomach is physiologically distended. There are no dilated or thickened bowel loops. The appendix is normal. Small to moderate volume of colonic stool without colonic wall thickening.  Vascular/Lymphatic: Small retroperitoneal lymph nodes all measuring less than 3 mm in short axis dimension. No pathologic retroperitoneal, mesenteric, or inguinal adenopathy. The abdominal aorta is normal in caliber.  Reproductive: The uterus is normal for age. There is is 3 cm left ovarian cyst. Physiologic follicles in the right ovary. No adnexal mass.  Bladder:  Physiologically distended.  Other: No ascites.  Musculoskeletal: Osseous  metastatic disease involving the left acetabulum measuring 4.0 x 3.7 cm. There is destruction of the medial cortex with soft tissue component and involvement of the  superior articular surface. Cortical thinning laterally. Additional multifocal lytic lesions in the left ischium. Lytic lesion in the left femoral neck without definite cortical thinning. Sclerotic density in the right femoral neck. Additional small lucent lesions throughout the bony pelvis. Questionable small lytic lesions in L1 and L2 vertebral bodies.  IMPRESSION: 1. Left adrenal mass measures 5.2 cm with central low density concerning for necrotic metastasis. 2. Osseous metastatic disease. Large destructive lesion involving the left acetabulum with soft tissue component medial and involvement of the superior articular surface. Additional osseous metastasis in the left femoral neck, throughout the bony pelvis, and likely within L1 and L2 vertebral bodies. 3. Intra and extrahepatic biliary ductal dilatation without definable cause. No gallbladder distention or pancreatic ductal dilatation. No obstructing pancreatic lesion. 4. Tiny 2 mm subcapsular hypodensity in the right lobe of the liver, too small to characterize. Small 4 mm hypodensity in the lower right kidney, also too small to characterize.   Electronically Signed   By: Jeb Levering M.D.   On: 08/11/2014 21:45   Ir Aorta/thoracic  08/10/2014   INDICATION: No known malignancy, now with large (approximately 4.5 x 3.0 cm) spiculated mass within the superior segment of the right lower lobe. The page underwent attempted bronchoscopic biopsy, however the biopsy could not be performed secondary to active bleeding into the airway. As such, the patient was intubated for airway protection purposes.  The patient was observed uneventfully overnight in the ICU, though repeat bronchoscopy performed this morning demonstrated persistent bleeding from this infiltrative right lower lobe mass.  As such,  request made for bronchial artery embolization in hopes of avoiding potential life threatening hemoptysis.  EXAM: 1. ULTRASOUND GUIDANCE FOR ARTERIAL ACCESS 2. SELECTIVE RIGHT BRONCHIAL ARTERIOGRAM (THIRD ORDER) AND FLUOROSCOPIC GUIDED PERCUTANEOUS PARTICLE EMBOLIZATION.  COMPARISON:  Chest CTA - 08/07/2014  MEDICATIONS: Fentanyl 100 mcg IV; Versed at 3 mg IV  Sedation time: 30 minutes  CONTRAST:  10mL OMNIPAQUE IOHEXOL 300 MG/ML  SOLN  FLUOROSCOPY TIME:  6 minutes 36 seconds (606 mGy)  COMPLICATIONS: None immediate  TECHNIQUE: Informed written consent was obtained from the patient's family after a discussion of the risks, benefits and alternatives to treatment. Questions regarding the procedure were encouraged and answered. A timeout was performed prior to the initiation of the procedure.  The right groin was prepped and draped in the usual sterile fashion, and a sterile drape was applied covering the operative field. Maximum barrier sterile technique with sterile gowns and gloves were used for the procedure. A timeout was performed prior to the initiation of the procedure.  The right femoral head was marked fluoroscopically. Under ultrasound guidance, the right common femoral artery was accessed with a micropuncture kit after the overlying soft tissues were anesthetized with 1% lidocaine. An ultrasound image was saved for documentation purposes. The micropuncture sheath was exchanged for a 5 [place] vascular sheath over a Bentson wire. A closure arteriogram was performed through the side of the sheath confirming access within the right common femoral artery.  Over a Bentson wire, a Mickelson catheter was advanced to the level of the thoracic aorta where it was back bled and flushed. The catheter was then utilized to select the hypertrophied right bronchial artery. A selective right bronchial arteriogram was performed.  With the use of a 0.014 fathom wire, a high-flow Renegade micro catheter was advanced into the  distal aspect of the right bronchial artery supplying the superior segment of the right lower lobe. Repeat sub selective arteriogram was performed.  The  distal aspect of the inferior segment of the right bronchial artery was then subsequently percutaneously particle embolized with approximately 3/4 of 1 vial of 300-500 micron Embospheres. The micro catheter was retracted into the caudal aspect of the right bronchial artery and a post embolization arteriogram was performed.  Images reviewed and the procedure was terminated. All wires catheters and sheaths were removed from the patient. Hemostasis was achieved at the right groin access site with deployment of an Exoseal closure device. A dressing was placed. The patient tolerated the procedure well without immediate postprocedural complication.  FINDINGS: Selective right bronchial arteriogram demonstrates marked hypertrophy of this vessel as demonstrated on preceding chest CTA.  The caudal tributaries of the right bronchial artery were noted to supply an ill-defined area of hyperemia within the superior medial aspect of the right lower lobe correlating with the spiculated mass seen on preceding chest CT. Following percutaneous selective particle embolization, there is near absence of flow into these abnormal distal vessels supplying the known spiculated mass.  IMPRESSION: Technically successful right bronchial artery percutaneous particle embolization for active bleeding from the known infiltrative mass within the superior segment of the right lower lobe.   Electronically Signed   By: Sandi Mariscal M.D.   On: 08/10/2014 18:18   Ir Angiogram Selective Each Additional Vessel  08/10/2014   INDICATION: No known malignancy, now with large (approximately 4.5 x 3.0 cm) spiculated mass within the superior segment of the right lower lobe. The page underwent attempted bronchoscopic biopsy, however the biopsy could not be performed secondary to active bleeding into the airway.  As such, the patient was intubated for airway protection purposes.  The patient was observed uneventfully overnight in the ICU, though repeat bronchoscopy performed this morning demonstrated persistent bleeding from this infiltrative right lower lobe mass.  As such, request made for bronchial artery embolization in hopes of avoiding potential life threatening hemoptysis.  EXAM: 1. ULTRASOUND GUIDANCE FOR ARTERIAL ACCESS 2. SELECTIVE RIGHT BRONCHIAL ARTERIOGRAM (THIRD ORDER) AND FLUOROSCOPIC GUIDED PERCUTANEOUS PARTICLE EMBOLIZATION.  COMPARISON:  Chest CTA - 08/07/2014  MEDICATIONS: Fentanyl 100 mcg IV; Versed at 3 mg IV  Sedation time: 30 minutes  CONTRAST:  37mL OMNIPAQUE IOHEXOL 300 MG/ML  SOLN  FLUOROSCOPY TIME:  6 minutes 36 seconds (762 mGy)  COMPLICATIONS: None immediate  TECHNIQUE: Informed written consent was obtained from the patient's family after a discussion of the risks, benefits and alternatives to treatment. Questions regarding the procedure were encouraged and answered. A timeout was performed prior to the initiation of the procedure.  The right groin was prepped and draped in the usual sterile fashion, and a sterile drape was applied covering the operative field. Maximum barrier sterile technique with sterile gowns and gloves were used for the procedure. A timeout was performed prior to the initiation of the procedure.  The right femoral head was marked fluoroscopically. Under ultrasound guidance, the right common femoral artery was accessed with a micropuncture kit after the overlying soft tissues were anesthetized with 1% lidocaine. An ultrasound image was saved for documentation purposes. The micropuncture sheath was exchanged for a 5 [place] vascular sheath over a Bentson wire. A closure arteriogram was performed through the side of the sheath confirming access within the right common femoral artery.  Over a Bentson wire, a Mickelson catheter was advanced to the level of the thoracic aorta where  it was back bled and flushed. The catheter was then utilized to select the hypertrophied right bronchial artery. A selective right bronchial arteriogram was performed.  With the use of a 0.014 fathom wire, a high-flow Renegade micro catheter was advanced into the distal aspect of the right bronchial artery supplying the superior segment of the right lower lobe. Repeat sub selective arteriogram was performed.  The distal aspect of the inferior segment of the right bronchial artery was then subsequently percutaneously particle embolized with approximately 3/4 of 1 vial of 300-500 micron Embospheres. The micro catheter was retracted into the caudal aspect of the right bronchial artery and a post embolization arteriogram was performed.  Images reviewed and the procedure was terminated. All wires catheters and sheaths were removed from the patient. Hemostasis was achieved at the right groin access site with deployment of an Exoseal closure device. A dressing was placed. The patient tolerated the procedure well without immediate postprocedural complication.  FINDINGS: Selective right bronchial arteriogram demonstrates marked hypertrophy of this vessel as demonstrated on preceding chest CTA.  The caudal tributaries of the right bronchial artery were noted to supply an ill-defined area of hyperemia within the superior medial aspect of the right lower lobe correlating with the spiculated mass seen on preceding chest CT. Following percutaneous selective particle embolization, there is near absence of flow into these abnormal distal vessels supplying the known spiculated mass.  IMPRESSION: Technically successful right bronchial artery percutaneous particle embolization for active bleeding from the known infiltrative mass within the superior segment of the right lower lobe.   Electronically Signed   By: Sandi Mariscal M.D.   On: 08/10/2014 18:18   Ir Angiogram Follow Up Study  08/10/2014   INDICATION: No known malignancy, now  with large (approximately 4.5 x 3.0 cm) spiculated mass within the superior segment of the right lower lobe. The page underwent attempted bronchoscopic biopsy, however the biopsy could not be performed secondary to active bleeding into the airway. As such, the patient was intubated for airway protection purposes.  The patient was observed uneventfully overnight in the ICU, though repeat bronchoscopy performed this morning demonstrated persistent bleeding from this infiltrative right lower lobe mass.  As such, request made for bronchial artery embolization in hopes of avoiding potential life threatening hemoptysis.  EXAM: 1. ULTRASOUND GUIDANCE FOR ARTERIAL ACCESS 2. SELECTIVE RIGHT BRONCHIAL ARTERIOGRAM (THIRD ORDER) AND FLUOROSCOPIC GUIDED PERCUTANEOUS PARTICLE EMBOLIZATION.  COMPARISON:  Chest CTA - 08/07/2014  MEDICATIONS: Fentanyl 100 mcg IV; Versed at 3 mg IV  Sedation time: 30 minutes  CONTRAST:  5mL OMNIPAQUE IOHEXOL 300 MG/ML  SOLN  FLUOROSCOPY TIME:  6 minutes 36 seconds (650 mGy)  COMPLICATIONS: None immediate  TECHNIQUE: Informed written consent was obtained from the patient's family after a discussion of the risks, benefits and alternatives to treatment. Questions regarding the procedure were encouraged and answered. A timeout was performed prior to the initiation of the procedure.  The right groin was prepped and draped in the usual sterile fashion, and a sterile drape was applied covering the operative field. Maximum barrier sterile technique with sterile gowns and gloves were used for the procedure. A timeout was performed prior to the initiation of the procedure.  The right femoral head was marked fluoroscopically. Under ultrasound guidance, the right common femoral artery was accessed with a micropuncture kit after the overlying soft tissues were anesthetized with 1% lidocaine. An ultrasound image was saved for documentation purposes. The micropuncture sheath was exchanged for a 5 [place] vascular  sheath over a Bentson wire. A closure arteriogram was performed through the side of the sheath confirming access within the right common femoral artery.  Over a  Bentson wire, a Mickelson catheter was advanced to the level of the thoracic aorta where it was back bled and flushed. The catheter was then utilized to select the hypertrophied right bronchial artery. A selective right bronchial arteriogram was performed.  With the use of a 0.014 fathom wire, a high-flow Renegade micro catheter was advanced into the distal aspect of the right bronchial artery supplying the superior segment of the right lower lobe. Repeat sub selective arteriogram was performed.  The distal aspect of the inferior segment of the right bronchial artery was then subsequently percutaneously particle embolized with approximately 3/4 of 1 vial of 300-500 micron Embospheres. The micro catheter was retracted into the caudal aspect of the right bronchial artery and a post embolization arteriogram was performed.  Images reviewed and the procedure was terminated. All wires catheters and sheaths were removed from the patient. Hemostasis was achieved at the right groin access site with deployment of an Exoseal closure device. A dressing was placed. The patient tolerated the procedure well without immediate postprocedural complication.  FINDINGS: Selective right bronchial arteriogram demonstrates marked hypertrophy of this vessel as demonstrated on preceding chest CTA.  The caudal tributaries of the right bronchial artery were noted to supply an ill-defined area of hyperemia within the superior medial aspect of the right lower lobe correlating with the spiculated mass seen on preceding chest CT. Following percutaneous selective particle embolization, there is near absence of flow into these abnormal distal vessels supplying the known spiculated mass.  IMPRESSION: Technically successful right bronchial artery percutaneous particle embolization for active  bleeding from the known infiltrative mass within the superior segment of the right lower lobe.   Electronically Signed   By: Sandi Mariscal M.D.   On: 08/10/2014 18:18   Ir US Guide Vasc Access Right  08/10/2014   INDICATION: No known malignancy, now with large (approximately 4.5 x 3.0 cm) spiculated mass within the superior segment of the right lower lobe. The page underwent attempted bronchoscopic biopsy, however the biopsy could not be performed secondary to active bleeding into the airway. As such, the patient was intubated for airway protection purposes.  The patient was observed uneventfully overnight in the ICU, though repeat bronchoscopy performed this morning demonstrated persistent bleeding from this infiltrative right lower lobe mass.  As such, request made for bronchial artery embolization in hopes of avoiding potential life threatening hemoptysis.  EXAM: 1. ULTRASOUND GUIDANCE FOR ARTERIAL ACCESS 2. SELECTIVE RIGHT BRONCHIAL ARTERIOGRAM (THIRD ORDER) AND FLUOROSCOPIC GUIDED PERCUTANEOUS PARTICLE EMBOLIZATION.  COMPARISON:  Chest CTA - 08/07/2014  MEDICATIONS: Fentanyl 100 mcg IV; Versed at 3 mg IV  Sedation time: 30 minutes  CONTRAST:  31mL OMNIPAQUE IOHEXOL 300 MG/ML  SOLN  FLUOROSCOPY TIME:  6 minutes 36 seconds (010 mGy)  COMPLICATIONS: None immediate  TECHNIQUE: Informed written consent was obtained from the patient's family after a discussion of the risks, benefits and alternatives to treatment. Questions regarding the procedure were encouraged and answered. A timeout was performed prior to the initiation of the procedure.  The right groin was prepped and draped in the usual sterile fashion, and a sterile drape was applied covering the operative field. Maximum barrier sterile technique with sterile gowns and gloves were used for the procedure. A timeout was performed prior to the initiation of the procedure.  The right femoral head was marked fluoroscopically. Under ultrasound guidance, the right  common femoral artery was accessed with a micropuncture kit after the overlying soft tissues were anesthetized with 1% lidocaine. An ultrasound image  was saved for documentation purposes. The micropuncture sheath was exchanged for a 5 [place] vascular sheath over a Bentson wire. A closure arteriogram was performed through the side of the sheath confirming access within the right common femoral artery.  Over a Bentson wire, a Mickelson catheter was advanced to the level of the thoracic aorta where it was back bled and flushed. The catheter was then utilized to select the hypertrophied right bronchial artery. A selective right bronchial arteriogram was performed.  With the use of a 0.014 fathom wire, a high-flow Renegade micro catheter was advanced into the distal aspect of the right bronchial artery supplying the superior segment of the right lower lobe. Repeat sub selective arteriogram was performed.  The distal aspect of the inferior segment of the right bronchial artery was then subsequently percutaneously particle embolized with approximately 3/4 of 1 vial of 300-500 micron Embospheres. The micro catheter was retracted into the caudal aspect of the right bronchial artery and a post embolization arteriogram was performed.  Images reviewed and the procedure was terminated. All wires catheters and sheaths were removed from the patient. Hemostasis was achieved at the right groin access site with deployment of an Exoseal closure device. A dressing was placed. The patient tolerated the procedure well without immediate postprocedural complication.  FINDINGS: Selective right bronchial arteriogram demonstrates marked hypertrophy of this vessel as demonstrated on preceding chest CTA.  The caudal tributaries of the right bronchial artery were noted to supply an ill-defined area of hyperemia within the superior medial aspect of the right lower lobe correlating with the spiculated mass seen on preceding chest CT. Following  percutaneous selective particle embolization, there is near absence of flow into these abnormal distal vessels supplying the known spiculated mass.  IMPRESSION: Technically successful right bronchial artery percutaneous particle embolization for active bleeding from the known infiltrative mass within the superior segment of the right lower lobe.   Electronically Signed   By: Sandi Mariscal M.D.   On: 08/10/2014 18:18   Dg Chest Port 1 View  08/11/2014   CLINICAL DATA:  History of thumb opt this cysts  EXAM: PORTABLE CHEST - 1 VIEW  COMPARISON:  Portable chest x-ray of 08/10/2014 and CT chest of 08/07/2014  FINDINGS: The endotracheal tube has been removed. There is persistent opacity overlying the right hilum and infrahilar region consistent with the patient's known carcinoma. Mild left basilar linear atelectasis is present. Heart size is stable.  IMPRESSION: Endotracheal tube removed. No change in right hilar -infrahilar opacity consistent with mass by CT.   Electronically Signed   By: Ivar Drape M.D.   On: 08/11/2014 08:02   Dg Chest Port 1 View  08/10/2014   CLINICAL DATA:  Hemoptysis  EXAM: PORTABLE CHEST - 1 VIEW  COMPARISON:  08/09/2014  FINDINGS: An endotracheal tube and nasogastric catheter are noted in satisfactory position. The cardiac shadow is within normal limits. The lungs are well aerated bilaterally. Right perihilar mass lesion is again identified similar to that noted on previous chest x-ray and CT. No pneumothorax is identified.  IMPRESSION: Stable right perihilar mass lesion.  Tubes and lines as described.   Electronically Signed   By: Inez Catalina M.D.   On: 08/10/2014 08:13   Dg Chest Port 1 View  08/09/2014   CLINICAL DATA:  Intubation, status post bronchoscopy  EXAM: PORTABLE CHEST - 1 VIEW  COMPARISON:  08/06/2014  FINDINGS: Right hilar/right lower lobe mass reidentified. Endotracheal tube is appropriately positioned. Heart size upper limits of normal.  No pneumothorax. No pleural  effusion. Curvilinear left lower lobe atelectasis or scarring.  IMPRESSION: No pneumothorax after bronchoscopy.   Electronically Signed   By: Conchita Paris M.D.   On: 08/09/2014 16:27   [place] Guide [employer]  08/10/2014   INDICATION: No known malignancy, now with large (approximately 4.5 x 3.0 cm) spiculated mass within the superior segment of the right lower lobe. The page underwent attempted bronchoscopic biopsy, however the biopsy could not be performed secondary to active bleeding into the airway. As such, the patient was intubated for airway protection purposes.  The patient was observed uneventfully overnight in the ICU, though repeat bronchoscopy performed this morning demonstrated persistent bleeding from this infiltrative right lower lobe mass.  As such, request made for bronchial artery embolization in hopes of avoiding potential life threatening hemoptysis.  EXAM: 1. ULTRASOUND GUIDANCE FOR ARTERIAL ACCESS 2. SELECTIVE RIGHT BRONCHIAL ARTERIOGRAM (THIRD ORDER) AND FLUOROSCOPIC GUIDED PERCUTANEOUS PARTICLE EMBOLIZATION.  COMPARISON:  Chest CTA - 08/07/2014  MEDICATIONS: Fentanyl 100 mcg IV; Versed at 3 mg IV  Sedation time: 30 minutes  CONTRAST:  53mL OMNIPAQUE IOHEXOL 300 MG/ML  SOLN  FLUOROSCOPY TIME:  6 minutes 36 seconds (409 mGy)  COMPLICATIONS: None immediate  TECHNIQUE: Informed written consent was obtained from the patient's family after a discussion of the risks, benefits and alternatives to treatment. Questions regarding the procedure were encouraged and answered. A timeout was performed prior to the initiation of the procedure.  The right groin was prepped and draped in the usual sterile fashion, and a sterile drape was applied covering the operative field. Maximum barrier sterile technique with sterile gowns and gloves were used for the procedure. A timeout was performed prior to the initiation of the procedure.  The right femoral head was marked  fluoroscopically. Under ultrasound guidance, the right common femoral artery was accessed with a micropuncture kit after the overlying soft tissues were anesthetized with 1% lidocaine. An ultrasound image was saved for documentation purposes. The micropuncture sheath was exchanged for a 5 [place] vascular sheath over a Bentson wire. A closure arteriogram was performed through the side of the sheath confirming access within the right common femoral artery.  Over a Bentson wire, a Mickelson catheter was advanced to the level of the thoracic aorta where it was back bled and flushed. The catheter was then utilized to select the hypertrophied right bronchial artery. A selective right bronchial arteriogram was performed.  With the use of a 0.014 fathom wire, a high-flow Renegade micro catheter was advanced into the distal aspect of the right bronchial artery supplying the superior segment of the right lower lobe. Repeat sub selective arteriogram was performed.  The distal aspect of the inferior segment of the right bronchial artery was then subsequently percutaneously particle embolized with approximately 3/4 of 1 vial of 300-500 micron Embospheres. The micro catheter was retracted into the caudal aspect of the right bronchial artery and a post embolization arteriogram was performed.  Images reviewed and the procedure was terminated. All wires catheters and sheaths were removed from the patient. Hemostasis was achieved at the right groin access site with deployment of an Exoseal closure device. A dressing was placed. The patient tolerated the procedure well without immediate postprocedural complication.  FINDINGS: Selective right bronchial arteriogram demonstrates marked hypertrophy of this vessel as demonstrated on preceding chest CTA.  The caudal tributaries of the right bronchial artery were noted to supply an ill-defined area of hyperemia within the  superior medial aspect of the right lower lobe correlating with the  spiculated mass seen on preceding chest CT. Following percutaneous selective particle embolization, there is near absence of flow into these abnormal distal vessels supplying the known spiculated mass.  IMPRESSION: Technically successful right bronchial artery percutaneous particle embolization for active bleeding from the known infiltrative mass within the superior segment of the right lower lobe.   Electronically Signed   By: Sandi Mariscal M.D.   On: 08/10/2014 18:18     CBC  Recent Labs Lab 08/07/14 1300 08/08/14 0458 08/10/14 0254 08/11/14 0218 08/12/14 0537 08/13/14 0551  WBC 12.6* 11.6* 16.8* 13.2* 18.7* 19.3*  HGB 13.0 11.5* 11.2* 10.9* 11.6* 10.9*  HCT 38.9 34.7* 33.4* 32.0* 34.1* 32.1*  PLT 562* 483* 444* 393 399 432*  MCV 91.3 91.6 92.8 92.2 90.9 90.9  MCH 30.5 30.3 31.1 31.4 30.9 30.9  MCHC 33.4 33.1 33.5 34.1 34.0 34.0  RDW 13.0 12.9 13.1 12.9 13.1 13.1  LYMPHSABS 2.7  --   --  1.5  --   --   MONOABS 0.9  --   --  1.0  --   --   EOSABS 0.1  --   --  0.0  --   --   BASOSABS 0.1  --   --  0.0  --   --     Chemistries   Recent Labs Lab 08/07/14 1300 08/08/14 0458 08/09/14 0448 08/10/14 0254 08/11/14 0218 08/12/14 0537 08/13/14 0551  NA 137 138  --  135 139 136  --   K 3.7 3.4* 3.7 3.8 3.3* 3.2* 3.2*  CL 103 106  --  103 108 101  --   CO2 25 25  --  $R'23 23 27  'QX$ --   GLUCOSE 89 98  --  93 101* 108*  --   BUN 6 5*  --  <5* <5* <5*  --   CREATININE 0.55 0.46  --  0.46 0.44 0.42*  --   CALCIUM 8.8* 8.5*  --  8.3* 8.2* 8.2*  --   MG  --   --   --   --   --   --  1.8  AST  --  16  --   --   --   --   --   ALT  --  14  --   --   --   --   --   ALKPHOS  --  145*  --   --   --   --   --   BILITOT  --  0.5  --   --   --   --   --    ------------------------------------------------------------------------------------------------------------------ estimated creatinine clearance is 62.5 mL/min (by C-G formula based on Cr of  0.42). ------------------------------------------------------------------------------------------------------------------ No results for input(s): HGBA1C in the last 72 hours. ------------------------------------------------------------------------------------------------------------------ No results for input(s): CHOL, HDL, LDLCALC, TRIG, CHOLHDL, LDLDIRECT in the last 72 hours. ------------------------------------------------------------------------------------------------------------------ No results for input(s): TSH, T4TOTAL, T3FREE, THYROIDAB in the last 72 hours.  Invalid input(s): FREET3 ------------------------------------------------------------------------------------------------------------------ No results for input(s): VITAMINB12, FOLATE, FERRITIN, TIBC, IRON, RETICCTPCT in the last 72 hours.  Coagulation profile  Recent Labs Lab 08/10/14 1150  INR 1.17    No results for input(s): DDIMER in the last 72 hours.  Cardiac Enzymes No results for input(s): CKMB, TROPONINI, MYOGLOBIN in the last 168 hours.  Invalid input(s): CK ------------------------------------------------------------------------------------------------------------------ Invalid input(s): POCBNP   Time Spent in minutes  [email or web address]  on 08/13/2014 at 11:38 AM  Between 7am to 7pm - Pager - [phone]  After 7pm go to [email or web address] - password [facility]  [facility] -  Office  [phone]

## 2014-08-14 LAB — CBC
HCT: 32 % — ABNORMAL LOW (ref 36.0–46.0)
Hemoglobin: 10.7 g/dL — ABNORMAL LOW (ref 12.0–15.0)
MCH: 30.1 pg (ref 26.0–34.0)
MCHC: 33.4 g/dL (ref 30.0–36.0)
MCV: 90.1 fL (ref 78.0–100.0)
PLATELETS: 422 10*3/uL — AB (ref 150–400)
RBC: 3.55 MIL/uL — ABNORMAL LOW (ref 3.87–5.11)
RDW: 13 % (ref 11.5–15.5)
WBC: 14.4 10*3/uL — AB (ref 4.0–10.5)

## 2014-08-14 LAB — BASIC METABOLIC PANEL
ANION GAP: 9 (ref 5–15)
BUN: <5 mg/dL — ABNORMAL LOW (ref 6–20)
CO2: 26 mmol/L (ref 22–32)
Calcium: 8.4 mg/dL — ABNORMAL LOW (ref 8.9–10.3)
Chloride: 100 mmol/L — ABNORMAL LOW (ref 101–111)
Creatinine, Ser: 0.38 mg/dL — ABNORMAL LOW (ref 0.44–1.00)
GFR calc Af Amer: >60 mL/min (ref >60)
GFR calc non Af Amer: >60 mL/min (ref >60)
Glucose, Bld: 120 mg/dL — ABNORMAL HIGH (ref 65–99)
Potassium: 3.6 mmol/L (ref 3.5–5.1)
SODIUM: 135 mmol/L (ref 135–145)

## 2014-08-14 LAB — URINE CULTURE

## 2014-08-14 LAB — MAGNESIUM: Magnesium: 2.1 mg/dL (ref 1.7–2.4)

## 2014-08-14 NOTE — Progress Notes (Signed)
Patient Demographics:    Deborah Foster, is a 44 y.o. female, DOB - March 22, 1970, KAJ:[id number]  Admit date - 08/07/2014   Admitting Physician Oswald Hillock, MD  Outpatient Primary MD for the patient is [name], MD  LOS - 7   Chief Complaint  Patient presents with  . Cough        Subjective:    Deborah Foster today has, No headache, No chest pain, No abdominal pain - No Nausea, No new weakness tingling or numbness, mild  Cough - but no SOB.  No frank hemoptysis.   Assessment  & Plan :     1. 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   She was extubated on 08/11/2014. No biopsies were done due to ongoing bleeding during bronchoscopy. CT abdomen and pelvis showed left adrenal mass and currently IR has been requested to biopsy that lesion. IR wants to biopsy once leukocytosis has improved as there is suspicion for a brewing infection.  So far clinically this looks like primary bronchogenic carcinoma with metastasis to left adrenal gland, thoracic vertebra, left hip along with possibly kidney.   2. Possible postobstructive pneumonia could be HCAP . Worsening leukocytosis, persistent infiltrate, low-grade fevers, did have bronchoscopy with endotracheal intubation and mechanical ventilation. Will stop Unasyn and start HCAP anti-biotics. Monitor clinically. Repeat sputum culture, UA stable. Now improving with HCAP anti-biotics.   3. Hypokalemia. Replaced. Stable.    Code Status : Full  Family Communication  : none  present  Disposition Plan  : Home  2 days  Consults  :  PCCM, IR  Procedures  :   CT chest 7/23 > Right lower lobe mass lesion with postobstructive changes and invasion into the bronchus intermedius. changes consistent with left adrenal metastatic disease as well as bony metastatic disease. Mild right middle lobe infiltrate.  7/25 bronchoscopy> large clot in the right lower lobe, significant bleeding with suctioning, bleeding controlled; intubated for airway protection, ICU monitoring.  7/26 bronchoscopy> minor bleeding noted with suctioning, resolved spontaneously; IR bronchial artery embolization; Extubated post procedure  CT Ab/Pelvis 7/27 > left adrenal mass, left acetabular metastatic lesion. Intra-and extrahepatic duct dilation.  CT-guided tissue biopsy ordered for 08/12/2014  DVT Prophylaxis  :    SCDs   Lab Results  Component Value Date   PLT 422* 08/14/2014    Inpatient Medications  Scheduled Meds: . feeding supplement  1 Container Oral BID BM  . guaiFENesin  600 mg Oral BID  . lactobacillus acidophilus  2 tablet Oral TID  . piperacillin-tazobactam (ZOSYN)  IV  3.375 g Intravenous Q8H  . vancomycin  750 mg Intravenous Q12H   Continuous Infusions:   PRN Meds:.acetaminophen **OR** acetaminophen, albuterol, guaiFENesin-codeine, ondansetron **OR** ondansetron (ZOFRAN) IV  Antibiotics  :     Anti-infectives    Start     Dose/Rate Route Frequency Ordered Stop   08/14/14 0100  vancomycin (VANCOCIN) IVPB 750 mg/150 ml premix     750 mg 150 mL/hr over  60 Minutes Intravenous Every 12 hours 08/13/14 1157     08/13/14 1300  piperacillin-tazobactam (ZOSYN) IVPB 3.375 g     3.375 g 12.5 mL/hr over 240 Minutes Intravenous Every 8 hours 08/13/14 1157     08/13/14 1200  vancomycin (VANCOCIN) IVPB 1000 mg/200 mL premix     1,000 mg 200 mL/hr over 60 Minutes Intravenous  Once 08/13/14 1157 08/13/14 1318   08/08/14 1600  azithromycin (ZITHROMAX) 500 mg in dextrose 5 % 250 mL  IVPB  Status:  Discontinued     500 mg 250 mL/hr over 60 Minutes Intravenous Every 24 hours 08/07/14 1639 08/08/14 1052   08/08/14 1500  cefTRIAXone (ROCEPHIN) 1 g in dextrose 5 % 50 mL IVPB - Premix  Status:  Discontinued     1 g 100 mL/hr over 30 Minutes Intravenous Every 24 hours 08/07/14 1654 08/08/14 1052   08/08/14 1200  ampicillin-sulbactam (UNASYN) 1.5 g in sodium chloride 0.9 % 50 mL IVPB  Status:  Discontinued     1.5 g 100 mL/hr over 30 Minutes Intravenous Every 6 hours 08/08/14 1124 08/13/14 1138   08/07/14 1515  cefTRIAXone (ROCEPHIN) 1 g in dextrose 5 % 50 mL IVPB     1 g 100 mL/hr over 30 Minutes Intravenous  Once 08/07/14 1501 08/07/14 1549   08/07/14 1515  azithromycin (ZITHROMAX) 500 mg in dextrose 5 % 250 mL IVPB     500 mg 250 mL/hr over 60 Minutes Intravenous  Once 08/07/14 1501 08/07/14 1721        Objective:   Filed Vitals:   08/13/14 1438 08/13/14 1637 08/13/14 2252 08/14/14 0656  BP: 108/58  94/58 97/47  Pulse: 104  93 90  Temp: 100.3 F (37.9 C) 98.7 F (37.1 C) 99.1 F (37.3 C) 98.7 F (37.1 C)  TempSrc: Oral Oral Oral Oral  Resp: $Remo'17  21 14  'VnGcZ$ Height:      Weight:      SpO2: 99%  98% 98%    Wt Readings from Last 3 Encounters:  08/11/14 51.8 kg (114 lb 3.2 oz)     Intake/Output Summary (Last 24 hours) at 08/14/14 0944 Last data filed at 08/14/14 0855  Gross per 24 hour  Intake    420 ml  Output      0 ml  Net    420 ml     Physical Exam  Awake Alert, Oriented X 3, No new F.N deficits, Normal affect .AT,PERRAL Supple Neck,No JVD, No cervical lymphadenopathy appriciated.  Symmetrical Chest wall movement, Good air movement bilaterally, CTAB RRR,No Gallops,Rubs or new Murmurs, No Parasternal Heave +ve B.Sounds, Abd Soft, No tenderness, No organomegaly appriciated, No rebound - guarding or rigidity. No Cyanosis, Clubbing or edema, No new Rash or bruise       Data Review:   Micro Results Recent Results (from the past 240 hour(s))    Culture, blood (routine x 2)     Status: None   Collection Time: 08/07/14  5:10 PM  Result Value Ref Range Status   Specimen Description BLOOD RIGHT HAND  Final   Special Requests BOTTLES DRAWN AEROBIC AND ANAEROBIC 10CC  Final   Culture NO GROWTH 5 DAYS  Final   Report Status 08/12/2014 FINAL  Final  Culture, blood (routine x 2)     Status: None   Collection Time: 08/07/14  5:13 PM  Result Value Ref Range Status   Specimen Description BLOOD LEFT WRIST  Final   Special Requests BOTTLES DRAWN AEROBIC AND  ANAEROBIC 10CC  Final   Culture NO GROWTH 5 DAYS  Final   Report Status 08/12/2014 FINAL  Final  AFB culture with smear     Status: None (Preliminary result)   Collection Time: 08/09/14  3:30 PM  Result Value Ref Range Status   Specimen Description BRONCHIAL WASHINGS  Final   Special Requests Normal  Final   Acid Fast Smear   Final    NO ACID FAST BACILLI SEEN Performed at [facility]    Culture   Final    CULTURE WILL BE EXAMINED FOR 6 WEEKS BEFORE ISSUING A FINAL REPORT Performed at [facility]    Report Status PENDING  Incomplete  Fungus Culture with Smear     Status: None (Preliminary result)   Collection Time: 08/09/14  3:30 PM  Result Value Ref Range Status   Specimen Description BRONCHIAL WASHINGS  Final   Special Requests Normal  Final   Fungal Smear   Final    NO YEAST OR FUNGAL ELEMENTS SEEN Performed at [facility]    Culture   Final    CULTURE IN PROGRESS FOR FOUR WEEKS Performed at [facility]    Report Status PENDING  Incomplete  MRSA PCR Screening     Status: None   Collection Time: 08/09/14  7:30 PM  Result Value Ref Range Status   MRSA by PCR NEGATIVE NEGATIVE Final    Comment:        The GeneXpert MRSA Assay (FDA approved for NASAL specimens only), is one component of a comprehensive MRSA colonization surveillance program. It is not intended to diagnose MRSA infection nor to guide or monitor treatment  for MRSA infections.   Culture, expectorated sputum-assessment     Status: None   Collection Time: 08/13/14  1:54 PM  Result Value Ref Range Status   Specimen Description SPUTUM  Final   Special Requests NONE  Final   Sputum evaluation   Final    THIS SPECIMEN IS ACCEPTABLE. RESPIRATORY CULTURE REPORT TO FOLLOW.   Report Status 08/13/2014 FINAL  Final    Radiology Reports Dg Chest 2 View  08/13/2014   CLINICAL DATA:  44 year old female with shortness of breath and hemoptysis for almost 1 week. Known right lower lobe mass. Status post recent right bronchial artery embolization on 08/10/2014.  EXAM: CHEST  2 VIEW  COMPARISON:  Recent prior chest x-ray 08/11/2014  FINDINGS: Masslike opacity in the right lower lobe again demonstrated without significant interval change. Ill-defined airspace opacity in the periphery of the lesion likely consistent with a combination of postobstructive changes and possibly alveolar hemorrhage. The left lung remains relatively clear. The cardiac and mediastinal contours are unchanged. No acute osseous abnormality. Overall, very similar appearance of the chest. No new pneumothorax or pleural effusion.  IMPRESSION: Similar appearance of known right lower lobe pulmonary mass and surrounding airspace opacity likely reflecting a combination of postobstructive change and alveolar hemorrhage.  No significant interval change.   Electronically Signed   By: Jacqulynn Cadet M.D.   On: 08/13/2014 08:04   Dg Chest 2 View  08/06/2014   CLINICAL DATA:  Hemoptysis and chest pain  EXAM: CHEST  2 VIEW  COMPARISON:  None.  FINDINGS: There is focal airspace consolidation in the right middle lobe. Lungs elsewhere clear. Heart size and pulmonary vascularity are normal. No adenopathy. No bone lesions.  IMPRESSION: Right middle lobe pneumonia. Lungs otherwise clear. Followup PA and lateral chest radiographs recommended in 3-4 weeks following  trial of antibiotic therapy to ensure resolution and  exclude underlying malignancy.  These results will be called to the ordering clinician or representative by the Radiologist Assistant, and communication documented in the PACS or zVision Dashboard.   Electronically Signed   By: Lowella Grip III M.D.   On: 08/06/2014 17:22   Ct Angio Chest Pe W/cm &/or Wo Cm  08/07/2014   CLINICAL DATA:  Right middle lobe pneumonia on chest x-ray, recent hemoptysis  EXAM: CT ANGIOGRAPHY CHEST WITH CONTRAST  TECHNIQUE: Multidetector CT imaging of the chest was performed using the standard protocol during bolus administration of intravenous contrast. Multiplanar CT image reconstructions and MIPs were obtained to evaluate the vascular anatomy.  CONTRAST:  68mL OMNIPAQUE IOHEXOL 350 MG/ML SOLN  COMPARISON:  Chest x-ray from the previous day  FINDINGS: Left lung is well aerated without evidence focal infiltrate or sizable effusion. The right lung is also well aerated and demonstrates some mild infiltrate within the right middle lobe. In the right lower lobe emanating from the hilum inferiorly there is a 4.4 by 3.0 by 3.6 cm peripherally spiculated mass lesion consistent with a primary pulmonary neoplasm. Distal to this there are changes of postobstructive infiltrate. There is occlusion of the lower lobe bronchus as well as significant attenuation of the lower lobe pulmonary artery. Filling defect is identified within the bronchus intermedius consistent with local in growth. Right hilar adenopathy measuring 14 mm in short axis is seen.  The thoracic aorta in its branches are within normal limits. Pulmonary artery demonstrates a normal branching pattern without evidence of pulmonary embolism. Narrowing is noted in the right lower lobe branches consistent with the underlying mass lesion.  Scanning into the upper abdomen demonstrates evidence of a 3.8 cm mass in the left adrenal gland consistent with metastatic disease. The bony structures show patchy lucencies in the thoracic  vertebral bodies consistent with metastatic disease.  Review of the MIP images confirms the above findings.  IMPRESSION: Right lower lobe mass lesion with postobstructive changes and invasion into the bronchus intermedius as described consistent with a primary pulmonary neoplasm. There are changes consistent with left adrenal metastatic disease as well as bony metastatic disease.  Mild right middle lobe infiltrate.   Electronically Signed   By: Inez Catalina M.D.   On: 08/07/2014 14:24   Ct Abdomen Pelvis W Contrast  08/11/2014   CLINICAL DATA:  Lung cancer. Recent diagnosis of right lower lobe mass with left adrenal nodule and bony metastatic disease.  EXAM: CT ABDOMEN AND PELVIS WITH CONTRAST  TECHNIQUE: Multidetector CT imaging of the abdomen and pelvis was performed using the standard protocol following bolus administration of intravenous contrast.  CONTRAST:  26mL OMNIPAQUE IOHEXOL 300 MG/ML  SOLN  COMPARISON:  Chest CT 08/07/2014  FINDINGS: Lower chest: Right lower lobe mass is partially included, there is increased adjacent ground-glass opacity and consolidation, likely related to hemorrhage. No definite right pleural effusion. Minimal atelectasis in the left lower lobe with tiny subpulmonic left pleural effusion.  Hepatobiliary: Intra and extrahepatic biliary ductal dilatation, the common bile duct measures up to 1.7 cm proximally with tapering at the duodenum all insertion. Mild central intra and extrahepatic biliary ductal dilatation. The gallbladder is physiologically distended. Punctate, less than 2 mm hypodensity in the subcapsular right lobe of the liver. There is otherwise no focal hepatic lesion or signs of hepatic metastatic disease.  Pancreas: Normal, no pancreatic ductal dilatation. No focal pancreatic lesion, particularly at the pancreatic head.  Spleen: Normal in  size without focal lesion.  Adrenals/Urinary Tract: Heterogeneous left adrenal mass measuring 5.2 x 2.5 cm with central low density  component concerning for necrosis. The right adrenal gland appears normal. Kidneys demonstrate symmetric enhancement and excretion. No hydronephrosis. Mm cortical hypodensity in the lower right kidney, too small to characterize.  Stomach/Bowel: Stomach is physiologically distended. There are no dilated or thickened bowel loops. The appendix is normal. Small to moderate volume of colonic stool without colonic wall thickening.  Vascular/Lymphatic: Small retroperitoneal lymph nodes all measuring less than 3 mm in short axis dimension. No pathologic retroperitoneal, mesenteric, or inguinal adenopathy. The abdominal aorta is normal in caliber.  Reproductive: The uterus is normal for age. There is is 3 cm left ovarian cyst. Physiologic follicles in the right ovary. No adnexal mass.  Bladder:  Physiologically distended.  Other: No ascites.  Musculoskeletal: Osseous metastatic disease involving the left acetabulum measuring 4.0 x 3.7 cm. There is destruction of the medial cortex with soft tissue component and involvement of the superior articular surface. Cortical thinning laterally. Additional multifocal lytic lesions in the left ischium. Lytic lesion in the left femoral neck without definite cortical thinning. Sclerotic density in the right femoral neck. Additional small lucent lesions throughout the bony pelvis. Questionable small lytic lesions in L1 and L2 vertebral bodies.  IMPRESSION: 1. Left adrenal mass measures 5.2 cm with central low density concerning for necrotic metastasis. 2. Osseous metastatic disease. Large destructive lesion involving the left acetabulum with soft tissue component medial and involvement of the superior articular surface. Additional osseous metastasis in the left femoral neck, throughout the bony pelvis, and likely within L1 and L2 vertebral bodies. 3. Intra and extrahepatic biliary ductal dilatation without definable cause. No gallbladder distention or pancreatic ductal dilatation. No  obstructing pancreatic lesion. 4. Tiny 2 mm subcapsular hypodensity in the right lobe of the liver, too small to characterize. Small 4 mm hypodensity in the lower right kidney, also too small to characterize.   Electronically Signed   By: Jeb Levering M.D.   On: 08/11/2014 21:45   Ir Aorta/thoracic  08/10/2014   INDICATION: No known malignancy, now with large (approximately 4.5 x 3.0 cm) spiculated mass within the superior segment of the right lower lobe. The page underwent attempted bronchoscopic biopsy, however the biopsy could not be performed secondary to active bleeding into the airway. As such, the patient was intubated for airway protection purposes.  The patient was observed uneventfully overnight in the ICU, though repeat bronchoscopy performed this morning demonstrated persistent bleeding from this infiltrative right lower lobe mass.  As such, request made for bronchial artery embolization in hopes of avoiding potential life threatening hemoptysis.  EXAM: 1. ULTRASOUND GUIDANCE FOR ARTERIAL ACCESS 2. SELECTIVE RIGHT BRONCHIAL ARTERIOGRAM (THIRD ORDER) AND FLUOROSCOPIC GUIDED PERCUTANEOUS PARTICLE EMBOLIZATION.  COMPARISON:  Chest CTA - 08/07/2014  MEDICATIONS: Fentanyl 100 mcg IV; Versed at 3 mg IV  Sedation time: 30 minutes  CONTRAST:  56mL OMNIPAQUE IOHEXOL 300 MG/ML  SOLN  FLUOROSCOPY TIME:  6 minutes 36 seconds (876 mGy)  COMPLICATIONS: None immediate  TECHNIQUE: Informed written consent was obtained from the patient's family after a discussion of the risks, benefits and alternatives to treatment. Questions regarding the procedure were encouraged and answered. A timeout was performed prior to the initiation of the procedure.  The right groin was prepped and draped in the usual sterile fashion, and a sterile drape was applied covering the operative field. Maximum barrier sterile technique with sterile gowns and gloves were used for  the procedure. A timeout was performed prior to the initiation of  the procedure.  The right femoral head was marked fluoroscopically. Under ultrasound guidance, the right common femoral artery was accessed with a micropuncture kit after the overlying soft tissues were anesthetized with 1% lidocaine. An ultrasound image was saved for documentation purposes. The micropuncture sheath was exchanged for a 5 [place] vascular sheath over a Bentson wire. A closure arteriogram was performed through the side of the sheath confirming access within the right common femoral artery.  Over a Bentson wire, a Mickelson catheter was advanced to the level of the thoracic aorta where it was back bled and flushed. The catheter was then utilized to select the hypertrophied right bronchial artery. A selective right bronchial arteriogram was performed.  With the use of a 0.014 fathom wire, a high-flow Renegade micro catheter was advanced into the distal aspect of the right bronchial artery supplying the superior segment of the right lower lobe. Repeat sub selective arteriogram was performed.  The distal aspect of the inferior segment of the right bronchial artery was then subsequently percutaneously particle embolized with approximately 3/4 of 1 vial of 300-500 micron Embospheres. The micro catheter was retracted into the caudal aspect of the right bronchial artery and a post embolization arteriogram was performed.  Images reviewed and the procedure was terminated. All wires catheters and sheaths were removed from the patient. Hemostasis was achieved at the right groin access site with deployment of an Exoseal closure device. A dressing was placed. The patient tolerated the procedure well without immediate postprocedural complication.  FINDINGS: Selective right bronchial arteriogram demonstrates marked hypertrophy of this vessel as demonstrated on preceding chest CTA.  The caudal tributaries of the right bronchial artery were noted to supply an ill-defined area of hyperemia within the superior medial  aspect of the right lower lobe correlating with the spiculated mass seen on preceding chest CT. Following percutaneous selective particle embolization, there is near absence of flow into these abnormal distal vessels supplying the known spiculated mass.  IMPRESSION: Technically successful right bronchial artery percutaneous particle embolization for active bleeding from the known infiltrative mass within the superior segment of the right lower lobe.   Electronically Signed   By: Sandi Mariscal M.D.   On: 08/10/2014 18:18   Ir Angiogram Selective Each Additional Vessel  08/10/2014   INDICATION: No known malignancy, now with large (approximately 4.5 x 3.0 cm) spiculated mass within the superior segment of the right lower lobe. The page underwent attempted bronchoscopic biopsy, however the biopsy could not be performed secondary to active bleeding into the airway. As such, the patient was intubated for airway protection purposes.  The patient was observed uneventfully overnight in the ICU, though repeat bronchoscopy performed this morning demonstrated persistent bleeding from this infiltrative right lower lobe mass.  As such, request made for bronchial artery embolization in hopes of avoiding potential life threatening hemoptysis.  EXAM: 1. ULTRASOUND GUIDANCE FOR ARTERIAL ACCESS 2. SELECTIVE RIGHT BRONCHIAL ARTERIOGRAM (THIRD ORDER) AND FLUOROSCOPIC GUIDED PERCUTANEOUS PARTICLE EMBOLIZATION.  COMPARISON:  Chest CTA - 08/07/2014  MEDICATIONS: Fentanyl 100 mcg IV; Versed at 3 mg IV  Sedation time: 30 minutes  CONTRAST:  3mL OMNIPAQUE IOHEXOL 300 MG/ML  SOLN  FLUOROSCOPY TIME:  6 minutes 36 seconds (937 mGy)  COMPLICATIONS: None immediate  TECHNIQUE: Informed written consent was obtained from the patient's family after a discussion of the risks, benefits and alternatives to treatment. Questions regarding the procedure were encouraged and answered. A timeout was performed  prior to the initiation of the procedure.  The  right groin was prepped and draped in the usual sterile fashion, and a sterile drape was applied covering the operative field. Maximum barrier sterile technique with sterile gowns and gloves were used for the procedure. A timeout was performed prior to the initiation of the procedure.  The right femoral head was marked fluoroscopically. Under ultrasound guidance, the right common femoral artery was accessed with a micropuncture kit after the overlying soft tissues were anesthetized with 1% lidocaine. An ultrasound image was saved for documentation purposes. The micropuncture sheath was exchanged for a 5 [place] vascular sheath over a Bentson wire. A closure arteriogram was performed through the side of the sheath confirming access within the right common femoral artery.  Over a Bentson wire, a Mickelson catheter was advanced to the level of the thoracic aorta where it was back bled and flushed. The catheter was then utilized to select the hypertrophied right bronchial artery. A selective right bronchial arteriogram was performed.  With the use of a 0.014 fathom wire, a high-flow Renegade micro catheter was advanced into the distal aspect of the right bronchial artery supplying the superior segment of the right lower lobe. Repeat sub selective arteriogram was performed.  The distal aspect of the inferior segment of the right bronchial artery was then subsequently percutaneously particle embolized with approximately 3/4 of 1 vial of 300-500 micron Embospheres. The micro catheter was retracted into the caudal aspect of the right bronchial artery and a post embolization arteriogram was performed.  Images reviewed and the procedure was terminated. All wires catheters and sheaths were removed from the patient. Hemostasis was achieved at the right groin access site with deployment of an Exoseal closure device. A dressing was placed. The patient tolerated the procedure well without immediate postprocedural complication.   FINDINGS: Selective right bronchial arteriogram demonstrates marked hypertrophy of this vessel as demonstrated on preceding chest CTA.  The caudal tributaries of the right bronchial artery were noted to supply an ill-defined area of hyperemia within the superior medial aspect of the right lower lobe correlating with the spiculated mass seen on preceding chest CT. Following percutaneous selective particle embolization, there is near absence of flow into these abnormal distal vessels supplying the known spiculated mass.  IMPRESSION: Technically successful right bronchial artery percutaneous particle embolization for active bleeding from the known infiltrative mass within the superior segment of the right lower lobe.   Electronically Signed   By: Sandi Mariscal M.D.   On: 08/10/2014 18:18   Ir Angiogram Follow Up Study  08/10/2014   INDICATION: No known malignancy, now with large (approximately 4.5 x 3.0 cm) spiculated mass within the superior segment of the right lower lobe. The page underwent attempted bronchoscopic biopsy, however the biopsy could not be performed secondary to active bleeding into the airway. As such, the patient was intubated for airway protection purposes.  The patient was observed uneventfully overnight in the ICU, though repeat bronchoscopy performed this morning demonstrated persistent bleeding from this infiltrative right lower lobe mass.  As such, request made for bronchial artery embolization in hopes of avoiding potential life threatening hemoptysis.  EXAM: 1. ULTRASOUND GUIDANCE FOR ARTERIAL ACCESS 2. SELECTIVE RIGHT BRONCHIAL ARTERIOGRAM (THIRD ORDER) AND FLUOROSCOPIC GUIDED PERCUTANEOUS PARTICLE EMBOLIZATION.  COMPARISON:  Chest CTA - 08/07/2014  MEDICATIONS: Fentanyl 100 mcg IV; Versed at 3 mg IV  Sedation time: 30 minutes  CONTRAST:  25mL OMNIPAQUE IOHEXOL 300 MG/ML  SOLN  FLUOROSCOPY TIME:  6 minutes 36  seconds (361 mGy)  COMPLICATIONS: None immediate  TECHNIQUE: Informed written  consent was obtained from the patient's family after a discussion of the risks, benefits and alternatives to treatment. Questions regarding the procedure were encouraged and answered. A timeout was performed prior to the initiation of the procedure.  The right groin was prepped and draped in the usual sterile fashion, and a sterile drape was applied covering the operative field. Maximum barrier sterile technique with sterile gowns and gloves were used for the procedure. A timeout was performed prior to the initiation of the procedure.  The right femoral head was marked fluoroscopically. Under ultrasound guidance, the right common femoral artery was accessed with a micropuncture kit after the overlying soft tissues were anesthetized with 1% lidocaine. An ultrasound image was saved for documentation purposes. The micropuncture sheath was exchanged for a 5 [place] vascular sheath over a Bentson wire. A closure arteriogram was performed through the side of the sheath confirming access within the right common femoral artery.  Over a Bentson wire, a Mickelson catheter was advanced to the level of the thoracic aorta where it was back bled and flushed. The catheter was then utilized to select the hypertrophied right bronchial artery. A selective right bronchial arteriogram was performed.  With the use of a 0.014 fathom wire, a high-flow Renegade micro catheter was advanced into the distal aspect of the right bronchial artery supplying the superior segment of the right lower lobe. Repeat sub selective arteriogram was performed.  The distal aspect of the inferior segment of the right bronchial artery was then subsequently percutaneously particle embolized with approximately 3/4 of 1 vial of 300-500 micron Embospheres. The micro catheter was retracted into the caudal aspect of the right bronchial artery and a post embolization arteriogram was performed.  Images reviewed and the procedure was terminated. All wires catheters and  sheaths were removed from the patient. Hemostasis was achieved at the right groin access site with deployment of an Exoseal closure device. A dressing was placed. The patient tolerated the procedure well without immediate postprocedural complication.  FINDINGS: Selective right bronchial arteriogram demonstrates marked hypertrophy of this vessel as demonstrated on preceding chest CTA.  The caudal tributaries of the right bronchial artery were noted to supply an ill-defined area of hyperemia within the superior medial aspect of the right lower lobe correlating with the spiculated mass seen on preceding chest CT. Following percutaneous selective particle embolization, there is near absence of flow into these abnormal distal vessels supplying the known spiculated mass.  IMPRESSION: Technically successful right bronchial artery percutaneous particle embolization for active bleeding from the known infiltrative mass within the superior segment of the right lower lobe.   Electronically Signed   By: Sandi Mariscal M.D.   On: 08/10/2014 18:18   Ir US Guide Vasc Access Right  08/10/2014   INDICATION: No known malignancy, now with large (approximately 4.5 x 3.0 cm) spiculated mass within the superior segment of the right lower lobe. The page underwent attempted bronchoscopic biopsy, however the biopsy could not be performed secondary to active bleeding into the airway. As such, the patient was intubated for airway protection purposes.  The patient was observed uneventfully overnight in the ICU, though repeat bronchoscopy performed this morning demonstrated persistent bleeding from this infiltrative right lower lobe mass.  As such, request made for bronchial artery embolization in hopes of avoiding potential life threatening hemoptysis.  EXAM: 1. ULTRASOUND GUIDANCE FOR ARTERIAL ACCESS 2. SELECTIVE RIGHT BRONCHIAL ARTERIOGRAM (THIRD ORDER) AND FLUOROSCOPIC GUIDED PERCUTANEOUS  PARTICLE EMBOLIZATION.  COMPARISON:  Chest CTA -  08/07/2014  MEDICATIONS: Fentanyl 100 mcg IV; Versed at 3 mg IV  Sedation time: 30 minutes  CONTRAST:  62mL OMNIPAQUE IOHEXOL 300 MG/ML  SOLN  FLUOROSCOPY TIME:  6 minutes 36 seconds (086 mGy)  COMPLICATIONS: None immediate  TECHNIQUE: Informed written consent was obtained from the patient's family after a discussion of the risks, benefits and alternatives to treatment. Questions regarding the procedure were encouraged and answered. A timeout was performed prior to the initiation of the procedure.  The right groin was prepped and draped in the usual sterile fashion, and a sterile drape was applied covering the operative field. Maximum barrier sterile technique with sterile gowns and gloves were used for the procedure. A timeout was performed prior to the initiation of the procedure.  The right femoral head was marked fluoroscopically. Under ultrasound guidance, the right common femoral artery was accessed with a micropuncture kit after the overlying soft tissues were anesthetized with 1% lidocaine. An ultrasound image was saved for documentation purposes. The micropuncture sheath was exchanged for a 5 [place] vascular sheath over a Bentson wire. A closure arteriogram was performed through the side of the sheath confirming access within the right common femoral artery.  Over a Bentson wire, a Mickelson catheter was advanced to the level of the thoracic aorta where it was back bled and flushed. The catheter was then utilized to select the hypertrophied right bronchial artery. A selective right bronchial arteriogram was performed.  With the use of a 0.014 fathom wire, a high-flow Renegade micro catheter was advanced into the distal aspect of the right bronchial artery supplying the superior segment of the right lower lobe. Repeat sub selective arteriogram was performed.  The distal aspect of the inferior segment of the right bronchial artery was then subsequently percutaneously particle embolized with approximately 3/4  of 1 vial of 300-500 micron Embospheres. The micro catheter was retracted into the caudal aspect of the right bronchial artery and a post embolization arteriogram was performed.  Images reviewed and the procedure was terminated. All wires catheters and sheaths were removed from the patient. Hemostasis was achieved at the right groin access site with deployment of an Exoseal closure device. A dressing was placed. The patient tolerated the procedure well without immediate postprocedural complication.  FINDINGS: Selective right bronchial arteriogram demonstrates marked hypertrophy of this vessel as demonstrated on preceding chest CTA.  The caudal tributaries of the right bronchial artery were noted to supply an ill-defined area of hyperemia within the superior medial aspect of the right lower lobe correlating with the spiculated mass seen on preceding chest CT. Following percutaneous selective particle embolization, there is near absence of flow into these abnormal distal vessels supplying the known spiculated mass.  IMPRESSION: Technically successful right bronchial artery percutaneous particle embolization for active bleeding from the known infiltrative mass within the superior segment of the right lower lobe.   Electronically Signed   By: Sandi Mariscal M.D.   On: 08/10/2014 18:18   Dg Chest Port 1 View  08/11/2014   CLINICAL DATA:  History of thumb opt this cysts  EXAM: PORTABLE CHEST - 1 VIEW  COMPARISON:  Portable chest x-ray of 08/10/2014 and CT chest of 08/07/2014  FINDINGS: The endotracheal tube has been removed. There is persistent opacity overlying the right hilum and infrahilar region consistent with the patient's known carcinoma. Mild left basilar linear atelectasis is present. Heart size is stable.  IMPRESSION: Endotracheal tube removed. No change in right  hilar -infrahilar opacity consistent with mass by CT.   Electronically Signed   By: Ivar Drape M.D.   On: 08/11/2014 08:02   Dg Chest Port 1  View  08/10/2014   CLINICAL DATA:  Hemoptysis  EXAM: PORTABLE CHEST - 1 VIEW  COMPARISON:  08/09/2014  FINDINGS: An endotracheal tube and nasogastric catheter are noted in satisfactory position. The cardiac shadow is within normal limits. The lungs are well aerated bilaterally. Right perihilar mass lesion is again identified similar to that noted on previous chest x-ray and CT. No pneumothorax is identified.  IMPRESSION: Stable right perihilar mass lesion.  Tubes and lines as described.   Electronically Signed   By: Inez Catalina M.D.   On: 08/10/2014 08:13   Dg Chest Port 1 View  08/09/2014   CLINICAL DATA:  Intubation, status post bronchoscopy  EXAM: PORTABLE CHEST - 1 VIEW  COMPARISON:  08/06/2014  FINDINGS: Right hilar/right lower lobe mass reidentified. Endotracheal tube is appropriately positioned. Heart size upper limits of normal. No pneumothorax. No pleural effusion. Curvilinear left lower lobe atelectasis or scarring.  IMPRESSION: No pneumothorax after bronchoscopy.   Electronically Signed   By: Conchita Paris M.D.   On: 08/09/2014 16:27   [place] [facility] [employer]  08/10/2014   INDICATION: No known malignancy, now with large (approximately 4.5 x 3.0 cm) spiculated mass within the superior segment of the right lower lobe. The page underwent attempted bronchoscopic biopsy, however the biopsy could not be performed secondary to active bleeding into the airway. As such, the patient was intubated for airway protection purposes.  The patient was observed uneventfully overnight in the ICU, though repeat bronchoscopy performed this morning demonstrated persistent bleeding from this infiltrative right lower lobe mass.  As such, request made for bronchial artery embolization in hopes of avoiding potential life threatening hemoptysis.  EXAM: 1. ULTRASOUND GUIDANCE FOR ARTERIAL ACCESS 2. SELECTIVE RIGHT BRONCHIAL ARTERIOGRAM (THIRD ORDER) AND FLUOROSCOPIC GUIDED PERCUTANEOUS  PARTICLE EMBOLIZATION.  COMPARISON:  Chest CTA - 08/07/2014  MEDICATIONS: Fentanyl 100 mcg IV; Versed at 3 mg IV  Sedation time: 30 minutes  CONTRAST:  79mL OMNIPAQUE IOHEXOL 300 MG/ML  SOLN  FLUOROSCOPY TIME:  6 minutes 36 seconds (149 mGy)  COMPLICATIONS: None immediate  TECHNIQUE: Informed written consent was obtained from the patient's family after a discussion of the risks, benefits and alternatives to treatment. Questions regarding the procedure were encouraged and answered. A timeout was performed prior to the initiation of the procedure.  The right groin was prepped and draped in the usual sterile fashion, and a sterile drape was applied covering the operative field. Maximum barrier sterile technique with sterile gowns and gloves were used for the procedure. A timeout was performed prior to the initiation of the procedure.  The right femoral head was marked fluoroscopically. Under ultrasound guidance, the right common femoral artery was accessed with a micropuncture kit after the overlying soft tissues were anesthetized with 1% lidocaine. An ultrasound image was saved for documentation purposes. The micropuncture sheath was exchanged for a 5 [place] vascular sheath over a Bentson wire. A closure arteriogram was performed through the side of the sheath confirming access within the right common femoral artery.  Over a Bentson wire, a Mickelson catheter was advanced to the level of the thoracic aorta where it was back bled and flushed. The catheter was then utilized to select the hypertrophied right bronchial artery. A selective right bronchial arteriogram was performed.  With  the use of a 0.014 fathom wire, a high-flow Renegade micro catheter was advanced into the distal aspect of the right bronchial artery supplying the superior segment of the right lower lobe. Repeat sub selective arteriogram was performed.  The distal aspect of the inferior segment of the right bronchial artery was then subsequently  percutaneously particle embolized with approximately 3/4 of 1 vial of 300-500 micron Embospheres. The micro catheter was retracted into the caudal aspect of the right bronchial artery and a post embolization arteriogram was performed.  Images reviewed and the procedure was terminated. All wires catheters and sheaths were removed from the patient. Hemostasis was achieved at the right groin access site with deployment of an Exoseal closure device. A dressing was placed. The patient tolerated the procedure well without immediate postprocedural complication.  FINDINGS: Selective right bronchial arteriogram demonstrates marked hypertrophy of this vessel as demonstrated on preceding chest CTA.  The caudal tributaries of the right bronchial artery were noted to supply an ill-defined area of hyperemia within the superior medial aspect of the right lower lobe correlating with the spiculated mass seen on preceding chest CT. Following percutaneous selective particle embolization, there is near absence of flow into these abnormal distal vessels supplying the known spiculated mass.  IMPRESSION: Technically successful right bronchial artery percutaneous particle embolization for active bleeding from the known infiltrative mass within the superior segment of the right lower lobe.   Electronically Signed   By: Sandi Mariscal M.D.   On: 08/10/2014 18:18     CBC  Recent Labs Lab 08/07/14 1300  08/10/14 0254 08/11/14 0218 08/12/14 0537 08/13/14 0551 08/14/14 0518  WBC 12.6*  < > 16.8* 13.2* 18.7* 19.3* 14.4*  HGB 13.0  < > 11.2* 10.9* 11.6* 10.9* 10.7*  HCT 38.9  < > 33.4* 32.0* 34.1* 32.1* 32.0*  PLT 562*  < > 444* 393 399 432* 422*  MCV 91.3  < > 92.8 92.2 90.9 90.9 90.1  MCH 30.5  < > 31.1 31.4 30.9 30.9 30.1  MCHC 33.4  < > 33.5 34.1 34.0 34.0 33.4  RDW 13.0  < > 13.1 12.9 13.1 13.1 13.0  LYMPHSABS 2.7  --   --  1.5  --   --   --   MONOABS 0.9  --   --  1.0  --   --   --   EOSABS 0.1  --   --  0.0  --   --   --    BASOSABS 0.1  --   --  0.0  --   --   --   < > = values in this interval not displayed.  Chemistries   Recent Labs Lab 08/08/14 0458  08/10/14 0254 08/11/14 0218 08/12/14 0537 08/13/14 0551 08/14/14 0518  NA 138  --  135 139 136  --  135  K 3.4*  < > 3.8 3.3* 3.2* 3.2* 3.6  CL 106  --  103 108 101  --  100*  CO2 25  --  $R'23 23 27  'qC$ --  26  GLUCOSE 98  --  93 101* 108*  --  120*  BUN 5*  --  <5* <5* <5*  --  <5*  CREATININE 0.46  --  0.46 0.44 0.42*  --  0.38*  CALCIUM 8.5*  --  8.3* 8.2* 8.2*  --  8.4*  MG  --   --   --   --   --  1.8 2.1  AST 16  --   --   --   --   --   --  ALT 14  --   --   --   --   --   --   ALKPHOS 145*  --   --   --   --   --   --   BILITOT 0.5  --   --   --   --   --   --   < > = values in this interval not displayed. ------------------------------------------------------------------------------------------------------------------ estimated creatinine clearance is 62.5 mL/min (by C-G formula based on Cr of 0.38). ------------------------------------------------------------------------------------------------------------------ No results for input(s): HGBA1C in the last 72 hours. ------------------------------------------------------------------------------------------------------------------ No results for input(s): CHOL, HDL, LDLCALC, TRIG, CHOLHDL, LDLDIRECT in the last 72 hours. ------------------------------------------------------------------------------------------------------------------ No results for input(s): TSH, T4TOTAL, T3FREE, THYROIDAB in the last 72 hours.  Invalid input(s): FREET3 ------------------------------------------------------------------------------------------------------------------ No results for input(s): VITAMINB12, FOLATE, FERRITIN, TIBC, IRON, RETICCTPCT in the last 72 hours.  Coagulation profile  Recent Labs Lab 08/10/14 1150  INR 1.17    No results for input(s): DDIMER in the last 72 hours.  Cardiac  Enzymes No results for input(s): CKMB, TROPONINI, MYOGLOBIN in the last 168 hours.  Invalid input(s): CK ------------------------------------------------------------------------------------------------------------------ Invalid input(s): POCBNP   Time Spent in minutes  35   Tashia Leiterman K M.D on 08/14/2014 at 9:44 AM  Between 7am to 7pm - Pager - [phone]  After 7pm go to [email or web address] - password [facility]  [facility] -  Office  [phone]

## 2014-08-15 LAB — CBC
HCT: 33.3 % — ABNORMAL LOW (ref 36.0–46.0)
Hemoglobin: 11.3 g/dL — ABNORMAL LOW (ref 12.0–15.0)
MCH: 30.6 pg (ref 26.0–34.0)
MCHC: 33.9 g/dL (ref 30.0–36.0)
MCV: 90.2 fL (ref 78.0–100.0)
PLATELETS: 444 10*3/uL — AB (ref 150–400)
RBC: 3.69 MIL/uL — ABNORMAL LOW (ref 3.87–5.11)
RDW: 13 % (ref 11.5–15.5)
WBC: 12.8 10*3/uL — AB (ref 4.0–10.5)

## 2014-08-15 LAB — CULTURE, RESPIRATORY

## 2014-08-15 LAB — CULTURE, RESPIRATORY W GRAM STAIN: Culture: NORMAL

## 2014-08-15 MED ORDER — HYDROCODONE-ACETAMINOPHEN 5-325 MG PO TABS
1.0000 | ORAL_TABLET | Freq: Three times a day (TID) | ORAL | Status: DC | PRN
  Administered 2014-08-15: 1 via ORAL
  Filled 2014-08-15: qty 1

## 2014-08-15 MED ORDER — HYDROCODONE-ACETAMINOPHEN 10-325 MG PO TABS
1.0000 | ORAL_TABLET | ORAL | Status: DC | PRN
  Administered 2014-08-15 – 2014-08-17 (×9): 1 via ORAL
  Filled 2014-08-15 (×9): qty 1

## 2014-08-15 NOTE — Progress Notes (Signed)
Patient Demographics:    Deborah Foster, is a 44 y.o. female, DOB - [birth year], KGM:[id number]  Admit date - 08/07/2014   Admitting Physician Oswald Hillock, MD  Outpatient Primary MD for the patient is [name], MD  LOS - 8   Chief Complaint  Patient presents with  . Cough        Subjective:    Deborah Foster today has, No headache, No chest pain, No abdominal pain - No Nausea, No new weakness tingling or numbness, mild  Cough - but no SOB.  No frank hemoptysis.   Assessment  & Plan :     1. 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   She was extubated on 08/11/2014. No biopsies were done due to ongoing bleeding during bronchoscopy. CT abdomen and pelvis showed left adrenal mass and currently IR has been requested to biopsy that lesion. IR wants to biopsy once leukocytosis has improved as there is suspicion for a brewing infection.  So far clinically this looks like primary bronchogenic carcinoma with metastasis to left adrenal gland, thoracic vertebra, left hip along with possibly kidney.   2. Possible postobstructive pneumonia could be HCAP . Worsening leukocytosis, persistent infiltrate, low-grade fevers, did have bronchoscopy with endotracheal intubation and mechanical ventilation. Will stop Unasyn and start HCAP anti-biotics. Monitor clinically. Repeat sputum culture, UA stable. Now improving with HCAP anti-biotics.   3. Hypokalemia. Replaced. Stable.   4. Some new onset mid back pain. Likely pain from T spine  metastasis. Lortab added.    Code Status : Full  Family Communication  : none present  Disposition Plan  : Home  2 days  Consults  :  PCCM, IR  Procedures  :   CT chest 7/23 > Right lower lobe mass lesion with postobstructive changes and invasion into the bronchus intermedius. changes consistent with left adrenal metastatic disease as well as bony metastatic disease. Mild right middle lobe infiltrate.  7/25 bronchoscopy> large clot in the right lower lobe, significant bleeding with suctioning, bleeding controlled; intubated for airway protection, ICU monitoring.  7/26 bronchoscopy> minor bleeding noted with suctioning, resolved spontaneously; IR bronchial artery embolization; Extubated post procedure  CT Ab/Pelvis 7/27 > left adrenal mass, left acetabular metastatic lesion. Intra-and extrahepatic duct dilation.  CT-guided tissue biopsy ordered for 08/12/2014  DVT Prophylaxis  :    SCDs   Lab Results  Component Value Date   PLT 444* 08/15/2014    Inpatient Medications  Scheduled Meds: . feeding supplement  1 Container Oral BID BM  . guaiFENesin  600 mg Oral BID  . lactobacillus acidophilus  2 tablet Oral TID  . piperacillin-tazobactam (ZOSYN)  IV  3.375 g Intravenous Q8H  . vancomycin  750 mg Intravenous Q12H   Continuous Infusions:   PRN Meds:.acetaminophen **OR** acetaminophen, albuterol, guaiFENesin-codeine, HYDROcodone-acetaminophen, ondansetron **OR** ondansetron (ZOFRAN) IV  Antibiotics  :     Anti-infectives    Start     Dose/Rate Route Frequency Ordered Stop   08/14/14  0100  vancomycin (VANCOCIN) IVPB 750 mg/150 ml premix     750 mg 150 mL/hr over 60 Minutes Intravenous Every 12 hours 08/13/14 1157     08/13/14 1300  piperacillin-tazobactam (ZOSYN) IVPB 3.375 g     3.375 g 12.5 mL/hr over 240 Minutes Intravenous Every 8 hours 08/13/14 1157     08/13/14 1200  vancomycin (VANCOCIN) IVPB 1000 mg/200 mL premix     1,000 mg 200 mL/hr over 60 Minutes  Intravenous  Once 08/13/14 1157 08/13/14 1318   08/08/14 1600  azithromycin (ZITHROMAX) 500 mg in dextrose 5 % 250 mL IVPB  Status:  Discontinued     500 mg 250 mL/hr over 60 Minutes Intravenous Every 24 hours 08/07/14 1639 08/08/14 1052   08/08/14 1500  cefTRIAXone (ROCEPHIN) 1 g in dextrose 5 % 50 mL IVPB - Premix  Status:  Discontinued     1 g 100 mL/hr over 30 Minutes Intravenous Every 24 hours 08/07/14 1654 08/08/14 1052   08/08/14 1200  ampicillin-sulbactam (UNASYN) 1.5 g in sodium chloride 0.9 % 50 mL IVPB  Status:  Discontinued     1.5 g 100 mL/hr over 30 Minutes Intravenous Every 6 hours 08/08/14 1124 08/13/14 1138   08/07/14 1515  cefTRIAXone (ROCEPHIN) 1 g in dextrose 5 % 50 mL IVPB     1 g 100 mL/hr over 30 Minutes Intravenous  Once 08/07/14 1501 08/07/14 1549   08/07/14 1515  azithromycin (ZITHROMAX) 500 mg in dextrose 5 % 250 mL IVPB     500 mg 250 mL/hr over 60 Minutes Intravenous  Once 08/07/14 1501 08/07/14 1721        Objective:   Filed Vitals:   08/14/14 1400 08/14/14 1816 08/14/14 2128 08/15/14 0553  BP: 91/56 1_0  Pulse: 103 96 94 88  Temp: 98.4 F (36.9 C) 100 F (37.8 C) 98 F (36.7 C) 98.6 F (37 C)  TempSrc: Oral Oral Oral Oral  Resp: _1 Height:      Weight:      SpO2: 99%  99% 98%    Wt Readings from Last 3 Encounters:  08/11/14 51.8 kg (114 lb 3.2 oz)     Intake/Output Summary (Last 24 hours) at 08/15/14 1003 Last data filed at 08/15/14 0910  Gross per 24 hour  Intake    810 ml  Output    750 ml  Net     60 ml     Physical Exam  Awake Alert, Oriented X 3, No new F.N deficits, Normal affect [place].AT,PERRAL Supple Neck,No JVD, No cervical lymphadenopathy appriciated.  Symmetrical Chest wall movement, Good air movement bilaterally, CTAB RRR,No Gallops,Rubs or new Murmurs, No Parasternal Heave +ve B.Sounds, Abd Soft, No tenderness, No organomegaly appriciated, No rebound - guarding or rigidity. No Cyanosis, Clubbing  or edema, No new Rash or bruise       Data Review:   Micro Results Recent Results (from the past 240 hour(s))  Culture, blood (routine x 2)     Status: None   Collection Time: 08/07/14  5:10 PM  Result Value Ref Range Status   Specimen Description BLOOD RIGHT HAND  Final   Special Requests BOTTLES DRAWN AEROBIC AND ANAEROBIC 10CC  Final   Culture NO GROWTH 5 DAYS  Final   Report Status 08/12/2014 FINAL  Final  Culture, blood (routine x 2)     Status: None   Collection Time: 08/07/14  5:13 PM  Result Value Ref Range Status  Specimen Description BLOOD LEFT WRIST  Final   Special Requests BOTTLES DRAWN AEROBIC AND ANAEROBIC 10CC  Final   Culture NO GROWTH 5 DAYS  Final   Report Status 08/12/2014 FINAL  Final  AFB culture with smear     Status: None (Preliminary result)   Collection Time: 08/09/14  3:30 PM  Result Value Ref Range Status   Specimen Description BRONCHIAL WASHINGS  Final   Special Requests Normal  Final   Acid Fast Smear   Final    NO ACID FAST BACILLI SEEN Performed at [facility]    Culture   Final    CULTURE WILL BE EXAMINED FOR 6 WEEKS BEFORE ISSUING A FINAL REPORT Performed at [facility]    Report Status PENDING  Incomplete  Fungus Culture with Smear     Status: None (Preliminary result)   Collection Time: 08/09/14  3:30 PM  Result Value Ref Range Status   Specimen Description BRONCHIAL WASHINGS  Final   Special Requests Normal  Final   Fungal Smear   Final    NO YEAST OR FUNGAL ELEMENTS SEEN Performed at [facility]    Culture   Final    CULTURE IN PROGRESS FOR FOUR WEEKS Performed at [facility]    Report Status PENDING  Incomplete  MRSA PCR Screening     Status: None   Collection Time: 08/09/14  7:30 PM  Result Value Ref Range Status   MRSA by PCR NEGATIVE NEGATIVE Final    Comment:        The GeneXpert MRSA Assay (FDA approved for NASAL specimens only), is one component of a comprehensive MRSA  colonization surveillance program. It is not intended to diagnose MRSA infection nor to guide or monitor treatment for MRSA infections.   Urine culture     Status: None   Collection Time: 08/13/14 10:58 AM  Result Value Ref Range Status   Specimen Description URINE, RANDOM  Final   Special Requests NONE  Final   Culture 20,000 COLONIES/mL YEAST  Final   Report Status 08/14/2014 FINAL  Final  Culture, expectorated sputum-assessment     Status: None   Collection Time: 08/13/14  1:54 PM  Result Value Ref Range Status   Specimen Description SPUTUM  Final   Special Requests NONE  Final   Sputum evaluation   Final    THIS SPECIMEN IS ACCEPTABLE. RESPIRATORY CULTURE REPORT TO FOLLOW.   Report Status 08/13/2014 FINAL  Final  Culture, respiratory (NON-Expectorated)     Status: None (Preliminary result)   Collection Time: 08/13/14  1:54 PM  Result Value Ref Range Status   Specimen Description SPUTUM  Final   Special Requests NONE  Final   Gram Stain   Final    RARE WBC PRESENT, PREDOMINANTLY MONONUCLEAR RARE SQUAMOUS EPITHELIAL CELLS PRESENT FEW GRAM POSITIVE COCCI IN PAIRS IN CHAINS IN CLUSTERS FEW GRAM NEGATIVE RODS Performed at [facility]    Culture   Final    Culture reincubated for better growth Performed at [facility]    Report Status PENDING  Incomplete    Radiology Reports Dg Chest 2 View  08/13/2014   CLINICAL DATA:  44 year old female with shortness of breath and hemoptysis for almost 1 week. Known right lower lobe mass. Status post recent right bronchial artery embolization on 08/10/2014.  EXAM: CHEST  2 VIEW  COMPARISON:  Recent prior chest x-ray 08/11/2014  FINDINGS: Masslike opacity in the right lower lobe again demonstrated  without significant interval change. Ill-defined airspace opacity in the periphery of the lesion likely consistent with a combination of postobstructive changes and possibly alveolar hemorrhage. The left lung remains relatively  clear. The cardiac and mediastinal contours are unchanged. No acute osseous abnormality. Overall, very similar appearance of the chest. No new pneumothorax or pleural effusion.  IMPRESSION: Similar appearance of known right lower lobe pulmonary mass and surrounding airspace opacity likely reflecting a combination of postobstructive change and alveolar hemorrhage.  No significant interval change.   Electronically Signed   By: Jacqulynn Cadet M.D.   On: 08/13/2014 08:04   Dg Chest 2 View  08/06/2014   CLINICAL DATA:  Hemoptysis and chest pain  EXAM: CHEST  2 VIEW  COMPARISON:  None.  FINDINGS: There is focal airspace consolidation in the right middle lobe. Lungs elsewhere clear. Heart size and pulmonary vascularity are normal. No adenopathy. No bone lesions.  IMPRESSION: Right middle lobe pneumonia. Lungs otherwise clear. Followup PA and lateral chest radiographs recommended in 3-4 weeks following trial of antibiotic therapy to ensure resolution and exclude underlying malignancy.  These results will be called to the ordering clinician or representative by the Radiologist Assistant, and communication documented in the PACS or zVision Dashboard.   Electronically Signed   By: Lowella Grip III M.D.   On: 08/06/2014 17:22   Ct Angio Chest Pe W/cm &/or Wo Cm  08/07/2014   CLINICAL DATA:  Right middle lobe pneumonia on chest x-ray, recent hemoptysis  EXAM: CT ANGIOGRAPHY CHEST WITH CONTRAST  TECHNIQUE: Multidetector CT imaging of the chest was performed using the standard protocol during bolus administration of intravenous contrast. Multiplanar CT image reconstructions and MIPs were obtained to evaluate the vascular anatomy.  CONTRAST:  44m OMNIPAQUE IOHEXOL 350 MG/ML SOLN  COMPARISON:  Chest x-ray from the previous day  FINDINGS: Left lung is well aerated without evidence focal infiltrate or sizable effusion. The right lung is also well aerated and demonstrates some mild infiltrate within the right middle  lobe. In the right lower lobe emanating from the hilum inferiorly there is a 4.4 by 3.0 by 3.6 cm peripherally spiculated mass lesion consistent with a primary pulmonary neoplasm. Distal to this there are changes of postobstructive infiltrate. There is occlusion of the lower lobe bronchus as well as significant attenuation of the lower lobe pulmonary artery. Filling defect is identified within the bronchus intermedius consistent with local in growth. Right hilar adenopathy measuring 14 mm in short axis is seen.  The thoracic aorta in its branches are within normal limits. Pulmonary artery demonstrates a normal branching pattern without evidence of pulmonary embolism. Narrowing is noted in the right lower lobe branches consistent with the underlying mass lesion.  Scanning into the upper abdomen demonstrates evidence of a 3.8 cm mass in the left adrenal gland consistent with metastatic disease. The bony structures show patchy lucencies in the thoracic vertebral bodies consistent with metastatic disease.  Review of the MIP images confirms the above findings.  IMPRESSION: Right lower lobe mass lesion with postobstructive changes and invasion into the bronchus intermedius as described consistent with a primary pulmonary neoplasm. There are changes consistent with left adrenal metastatic disease as well as bony metastatic disease.  Mild right middle lobe infiltrate.   Electronically Signed   By: MInez CatalinaM.D.   On: 08/07/2014 14:24   Ct Abdomen Pelvis W Contrast  08/11/2014   CLINICAL DATA:  Lung cancer. Recent diagnosis of right lower lobe mass with left adrenal nodule and  bony metastatic disease.  EXAM: CT ABDOMEN AND PELVIS WITH CONTRAST  TECHNIQUE: Multidetector CT imaging of the abdomen and pelvis was performed using the standard protocol following bolus administration of intravenous contrast.  CONTRAST:  64m OMNIPAQUE IOHEXOL 300 MG/ML  SOLN  COMPARISON:  Chest CT 08/07/2014  FINDINGS: Lower chest: Right  lower lobe mass is partially included, there is increased adjacent ground-glass opacity and consolidation, likely related to hemorrhage. No definite right pleural effusion. Minimal atelectasis in the left lower lobe with tiny subpulmonic left pleural effusion.  Hepatobiliary: Intra and extrahepatic biliary ductal dilatation, the common bile duct measures up to 1.7 cm proximally with tapering at the duodenum all insertion. Mild central intra and extrahepatic biliary ductal dilatation. The gallbladder is physiologically distended. Punctate, less than 2 mm hypodensity in the subcapsular right lobe of the liver. There is otherwise no focal hepatic lesion or signs of hepatic metastatic disease.  Pancreas: Normal, no pancreatic ductal dilatation. No focal pancreatic lesion, particularly at the pancreatic head.  Spleen: Normal in size without focal lesion.  Adrenals/Urinary Tract: Heterogeneous left adrenal mass measuring 5.2 x 2.5 cm with central low density component concerning for necrosis. The right adrenal gland appears normal. Kidneys demonstrate symmetric enhancement and excretion. No hydronephrosis. Mm cortical hypodensity in the lower right kidney, too small to characterize.  Stomach/Bowel: Stomach is physiologically distended. There are no dilated or thickened bowel loops. The appendix is normal. Small to moderate volume of colonic stool without colonic wall thickening.  Vascular/Lymphatic: Small retroperitoneal lymph nodes all measuring less than 3 mm in short axis dimension. No pathologic retroperitoneal, mesenteric, or inguinal adenopathy. The abdominal aorta is normal in caliber.  Reproductive: The uterus is normal for age. There is is 3 cm left ovarian cyst. Physiologic follicles in the right ovary. No adnexal mass.  Bladder:  Physiologically distended.  Other: No ascites.  Musculoskeletal: Osseous metastatic disease involving the left acetabulum measuring 4.0 x 3.7 cm. There is destruction of the medial  cortex with soft tissue component and involvement of the superior articular surface. Cortical thinning laterally. Additional multifocal lytic lesions in the left ischium. Lytic lesion in the left femoral neck without definite cortical thinning. Sclerotic density in the right femoral neck. Additional small lucent lesions throughout the bony pelvis. Questionable small lytic lesions in L1 and L2 vertebral bodies.  IMPRESSION: 1. Left adrenal mass measures 5.2 cm with central low density concerning for necrotic metastasis. 2. Osseous metastatic disease. Large destructive lesion involving the left acetabulum with soft tissue component medial and involvement of the superior articular surface. Additional osseous metastasis in the left femoral neck, throughout the bony pelvis, and likely within L1 and L2 vertebral bodies. 3. Intra and extrahepatic biliary ductal dilatation without definable cause. No gallbladder distention or pancreatic ductal dilatation. No obstructing pancreatic lesion. 4. Tiny 2 mm subcapsular hypodensity in the right lobe of the liver, too small to characterize. Small 4 mm hypodensity in the lower right kidney, also too small to characterize.   Electronically Signed   By: MJeb LeveringM.D.   On: 08/11/2014 21:45   Ir Aorta/thoracic  08/10/2014   INDICATION: No known malignancy, now with large (approximately 4.5 x 3.0 cm) spiculated mass within the superior segment of the right lower lobe. The page underwent attempted bronchoscopic biopsy, however the biopsy could not be performed secondary to active bleeding into the airway. As such, the patient was intubated for airway protection purposes.  The patient was observed uneventfully overnight in the ICU, though repeat  bronchoscopy performed this morning demonstrated persistent bleeding from this infiltrative right lower lobe mass.  As such, request made for bronchial artery embolization in hopes of avoiding potential life threatening hemoptysis.   EXAM: 1. ULTRASOUND GUIDANCE FOR ARTERIAL ACCESS 2. SELECTIVE RIGHT BRONCHIAL ARTERIOGRAM (THIRD ORDER) AND FLUOROSCOPIC GUIDED PERCUTANEOUS PARTICLE EMBOLIZATION.  COMPARISON:  Chest CTA - 08/07/2014  MEDICATIONS: Fentanyl 100 mcg IV; Versed at 3 mg IV  Sedation time: 30 minutes  CONTRAST:  30m OMNIPAQUE IOHEXOL 300 MG/ML  SOLN  FLUOROSCOPY TIME:  6 minutes 36 seconds (3300mGy)  COMPLICATIONS: None immediate  TECHNIQUE: Informed written consent was obtained from the patient's family after a discussion of the risks, benefits and alternatives to treatment. Questions regarding the procedure were encouraged and answered. A timeout was performed prior to the initiation of the procedure.  The right groin was prepped and draped in the usual sterile fashion, and a sterile drape was applied covering the operative field. Maximum barrier sterile technique with sterile gowns and gloves were used for the procedure. A timeout was performed prior to the initiation of the procedure.  The right femoral head was marked fluoroscopically. Under ultrasound guidance, the right common femoral artery was accessed with a micropuncture kit after the overlying soft tissues were anesthetized with 1% lidocaine. An ultrasound image was saved for documentation purposes. The micropuncture sheath was exchanged for a 5 [place] sheath over a Bentson wire. A closure arteriogram was performed through the side of the sheath confirming access within the right common femoral artery.  Over a Bentson wire, a Mickelson catheter was advanced to the level of the thoracic aorta where it was back bled and flushed. The catheter was then utilized to select the hypertrophied right bronchial artery. A selective right bronchial arteriogram was performed.  With the use of a 0.014 fathom wire, a high-flow Renegade micro catheter was advanced into the distal aspect of the right bronchial artery supplying the superior segment of the right lower lobe. Repeat sub  selective arteriogram was performed.  The distal aspect of the inferior segment of the right bronchial artery was then subsequently percutaneously particle embolized with approximately 3/4 of 1 vial of 300-500 micron Embospheres. The micro catheter was retracted into the caudal aspect of the right bronchial artery and a post embolization arteriogram was performed.  Images reviewed and the procedure was terminated. All wires catheters and sheaths were removed from the patient. Hemostasis was achieved at the right groin access site with deployment of an Exoseal closure device. A dressing was placed. The patient tolerated the procedure well without immediate postprocedural complication.  FINDINGS: Selective right bronchial arteriogram demonstrates marked hypertrophy of this vessel as demonstrated on preceding chest CTA.  The caudal tributaries of the right bronchial artery were noted to supply an ill-defined area of hyperemia within the superior medial aspect of the right lower lobe correlating with the spiculated mass seen on preceding chest CT. Following percutaneous selective particle embolization, there is near absence of flow into these abnormal distal vessels supplying the known spiculated mass.  IMPRESSION: Technically successful right bronchial artery percutaneous particle embolization for active bleeding from the known infiltrative mass within the superior segment of the right lower lobe.   Electronically Signed   By: JSandi MariscalM.D.   On: 08/10/2014 18:18   Ir Angiogram Selective Each Additional Vessel  08/10/2014   INDICATION: No known malignancy, now with large (approximately 4.5 x 3.0 cm) spiculated mass within the superior segment of the right lower lobe.  The page underwent attempted bronchoscopic biopsy, however the biopsy could not be performed secondary to active bleeding into the airway. As such, the patient was intubated for airway protection purposes.  The patient was observed uneventfully  overnight in the ICU, though repeat bronchoscopy performed this morning demonstrated persistent bleeding from this infiltrative right lower lobe mass.  As such, request made for bronchial artery embolization in hopes of avoiding potential life threatening hemoptysis.  EXAM: 1. ULTRASOUND GUIDANCE FOR ARTERIAL ACCESS 2. SELECTIVE RIGHT BRONCHIAL ARTERIOGRAM (THIRD ORDER) AND FLUOROSCOPIC GUIDED PERCUTANEOUS PARTICLE EMBOLIZATION.  COMPARISON:  Chest CTA - 08/07/2014  MEDICATIONS: Fentanyl 100 mcg IV; Versed at 3 mg IV  Sedation time: 30 minutes  CONTRAST:  36m OMNIPAQUE IOHEXOL 300 MG/ML  SOLN  FLUOROSCOPY TIME:  6 minutes 36 seconds (3099mGy)  COMPLICATIONS: None immediate  TECHNIQUE: Informed written consent was obtained from the patient's family after a discussion of the risks, benefits and alternatives to treatment. Questions regarding the procedure were encouraged and answered. A timeout was performed prior to the initiation of the procedure.  The right groin was prepped and draped in the usual sterile fashion, and a sterile drape was applied covering the operative field. Maximum barrier sterile technique with sterile gowns and gloves were used for the procedure. A timeout was performed prior to the initiation of the procedure.  The right femoral head was marked fluoroscopically. Under ultrasound guidance, the right common femoral artery was accessed with a micropuncture kit after the overlying soft tissues were anesthetized with 1% lidocaine. An ultrasound image was saved for documentation purposes. The micropuncture sheath was exchanged for a 5 [place] sheath over a Bentson wire. A closure arteriogram was performed through the side of the sheath confirming access within the right common femoral artery.  Over a Bentson wire, a Mickelson catheter was advanced to the level of the thoracic aorta where it was back bled and flushed. The catheter was then utilized to select the hypertrophied right bronchial  artery. A selective right bronchial arteriogram was performed.  With the use of a 0.014 fathom wire, a high-flow Renegade micro catheter was advanced into the distal aspect of the right bronchial artery supplying the superior segment of the right lower lobe. Repeat sub selective arteriogram was performed.  The distal aspect of the inferior segment of the right bronchial artery was then subsequently percutaneously particle embolized with approximately 3/4 of 1 vial of 300-500 micron Embospheres. The micro catheter was retracted into the caudal aspect of the right bronchial artery and a post embolization arteriogram was performed.  Images reviewed and the procedure was terminated. All wires catheters and sheaths were removed from the patient. Hemostasis was achieved at the right groin access site with deployment of an Exoseal closure device. A dressing was placed. The patient tolerated the procedure well without immediate postprocedural complication.  FINDINGS: Selective right bronchial arteriogram demonstrates marked hypertrophy of this vessel as demonstrated on preceding chest CTA.  The caudal tributaries of the right bronchial artery were noted to supply an ill-defined area of hyperemia within the superior medial aspect of the right lower lobe correlating with the spiculated mass seen on preceding chest CT. Following percutaneous selective particle embolization, there is near absence of flow into these abnormal distal vessels supplying the known spiculated mass.  IMPRESSION: Technically successful right bronchial artery percutaneous particle embolization for active bleeding from the known infiltrative mass within the superior segment of the right lower lobe.   Electronically Signed   By: JJenny Reichmann  Watts M.D.   On: 08/10/2014 18:18   Ir Angiogram Follow Up Study  08/10/2014   INDICATION: No known malignancy, now with large (approximately 4.5 x 3.0 cm) spiculated mass within the superior segment of the right lower  lobe. The page underwent attempted bronchoscopic biopsy, however the biopsy could not be performed secondary to active bleeding into the airway. As such, the patient was intubated for airway protection purposes.  The patient was observed uneventfully overnight in the ICU, though repeat bronchoscopy performed this morning demonstrated persistent bleeding from this infiltrative right lower lobe mass.  As such, request made for bronchial artery embolization in hopes of avoiding potential life threatening hemoptysis.  EXAM: 1. ULTRASOUND GUIDANCE FOR ARTERIAL ACCESS 2. SELECTIVE RIGHT BRONCHIAL ARTERIOGRAM (THIRD ORDER) AND FLUOROSCOPIC GUIDED PERCUTANEOUS PARTICLE EMBOLIZATION.  COMPARISON:  Chest CTA - 08/07/2014  MEDICATIONS: Fentanyl 100 mcg IV; Versed at 3 mg IV  Sedation time: 30 minutes  CONTRAST:  4m OMNIPAQUE IOHEXOL 300 MG/ML  SOLN  FLUOROSCOPY TIME:  6 minutes 36 seconds (3179mGy)  COMPLICATIONS: None immediate  TECHNIQUE: Informed written consent was obtained from the patient's family after a discussion of the risks, benefits and alternatives to treatment. Questions regarding the procedure were encouraged and answered. A timeout was performed prior to the initiation of the procedure.  The right groin was prepped and draped in the usual sterile fashion, and a sterile drape was applied covering the operative field. Maximum barrier sterile technique with sterile gowns and gloves were used for the procedure. A timeout was performed prior to the initiation of the procedure.  The right femoral head was marked fluoroscopically. Under ultrasound guidance, the right common femoral artery was accessed with a micropuncture kit after the overlying soft tissues were anesthetized with 1% lidocaine. An ultrasound image was saved for documentation purposes. The micropuncture sheath was exchanged for a 5 [place] sheath over a Bentson wire. A closure arteriogram was performed through the side of the sheath  confirming access within the right common femoral artery.  Over a Bentson wire, a Mickelson catheter was advanced to the level of the thoracic aorta where it was back bled and flushed. The catheter was then utilized to select the hypertrophied right bronchial artery. A selective right bronchial arteriogram was performed.  With the use of a 0.014 fathom wire, a high-flow Renegade micro catheter was advanced into the distal aspect of the right bronchial artery supplying the superior segment of the right lower lobe. Repeat sub selective arteriogram was performed.  The distal aspect of the inferior segment of the right bronchial artery was then subsequently percutaneously particle embolized with approximately 3/4 of 1 vial of 300-500 micron Embospheres. The micro catheter was retracted into the caudal aspect of the right bronchial artery and a post embolization arteriogram was performed.  Images reviewed and the procedure was terminated. All wires catheters and sheaths were removed from the patient. Hemostasis was achieved at the right groin access site with deployment of an Exoseal closure device. A dressing was placed. The patient tolerated the procedure well without immediate postprocedural complication.  FINDINGS: Selective right bronchial arteriogram demonstrates marked hypertrophy of this vessel as demonstrated on preceding chest CTA.  The caudal tributaries of the right bronchial artery were noted to supply an ill-defined area of hyperemia within the superior medial aspect of the right lower lobe correlating with the spiculated mass seen on preceding chest CT. Following percutaneous selective particle embolization, there is near absence of flow into these abnormal distal vessels  supplying the known spiculated mass.  IMPRESSION: Technically successful right bronchial artery percutaneous particle embolization for active bleeding from the known infiltrative mass within the superior segment of the right lower lobe.    Electronically Signed   By: Sandi Mariscal M.D.   On: 08/10/2014 18:18   Ir US Guide Vasc Access Right  08/10/2014   INDICATION: No known malignancy, now with large (approximately 4.5 x 3.0 cm) spiculated mass within the superior segment of the right lower lobe. The page underwent attempted bronchoscopic biopsy, however the biopsy could not be performed secondary to active bleeding into the airway. As such, the patient was intubated for airway protection purposes.  The patient was observed uneventfully overnight in the ICU, though repeat bronchoscopy performed this morning demonstrated persistent bleeding from this infiltrative right lower lobe mass.  As such, request made for bronchial artery embolization in hopes of avoiding potential life threatening hemoptysis.  EXAM: 1. ULTRASOUND GUIDANCE FOR ARTERIAL ACCESS 2. SELECTIVE RIGHT BRONCHIAL ARTERIOGRAM (THIRD ORDER) AND FLUOROSCOPIC GUIDED PERCUTANEOUS PARTICLE EMBOLIZATION.  COMPARISON:  Chest CTA - 08/07/2014  MEDICATIONS: Fentanyl 100 mcg IV; Versed at 3 mg IV  Sedation time: 30 minutes  CONTRAST:  10m OMNIPAQUE IOHEXOL 300 MG/ML  SOLN  FLUOROSCOPY TIME:  6 minutes 36 seconds (3539mGy)  COMPLICATIONS: None immediate  TECHNIQUE: Informed written consent was obtained from the patient's family after a discussion of the risks, benefits and alternatives to treatment. Questions regarding the procedure were encouraged and answered. A timeout was performed prior to the initiation of the procedure.  The right groin was prepped and draped in the usual sterile fashion, and a sterile drape was applied covering the operative field. Maximum barrier sterile technique with sterile gowns and gloves were used for the procedure. A timeout was performed prior to the initiation of the procedure.  The right femoral head was marked fluoroscopically. Under ultrasound guidance, the right common femoral artery was accessed with a micropuncture kit after the overlying soft tissues were  anesthetized with 1% lidocaine. An ultrasound image was saved for documentation purposes. The micropuncture sheath was exchanged for a 5 [place] sheath over a Bentson wire. A closure arteriogram was performed through the side of the sheath confirming access within the right common femoral artery.  Over a Bentson wire, a Mickelson catheter was advanced to the level of the thoracic aorta where it was back bled and flushed. The catheter was then utilized to select the hypertrophied right bronchial artery. A selective right bronchial arteriogram was performed.  With the use of a 0.014 fathom wire, a high-flow Renegade micro catheter was advanced into the distal aspect of the right bronchial artery supplying the superior segment of the right lower lobe. Repeat sub selective arteriogram was performed.  The distal aspect of the inferior segment of the right bronchial artery was then subsequently percutaneously particle embolized with approximately 3/4 of 1 vial of 300-500 micron Embospheres. The micro catheter was retracted into the caudal aspect of the right bronchial artery and a post embolization arteriogram was performed.  Images reviewed and the procedure was terminated. All wires catheters and sheaths were removed from the patient. Hemostasis was achieved at the right groin access site with deployment of an Exoseal closure device. A dressing was placed. The patient tolerated the procedure well without immediate postprocedural complication.  FINDINGS: Selective right bronchial arteriogram demonstrates marked hypertrophy of this vessel as demonstrated on preceding chest CTA.  The caudal tributaries of the right bronchial artery were noted to  supply an ill-defined area of hyperemia within the superior medial aspect of the right lower lobe correlating with the spiculated mass seen on preceding chest CT. Following percutaneous selective particle embolization, there is near absence of flow into these abnormal  distal vessels supplying the known spiculated mass.  IMPRESSION: Technically successful right bronchial artery percutaneous particle embolization for active bleeding from the known infiltrative mass within the superior segment of the right lower lobe.   Electronically Signed   By: Sandi Mariscal M.D.   On: 08/10/2014 18:18   Dg Chest Port 1 View  08/11/2014   CLINICAL DATA:  History of thumb opt this cysts  EXAM: PORTABLE CHEST - 1 VIEW  COMPARISON:  Portable chest x-ray of 08/10/2014 and CT chest of 08/07/2014  FINDINGS: The endotracheal tube has been removed. There is persistent opacity overlying the right hilum and infrahilar region consistent with the patient's known carcinoma. Mild left basilar linear atelectasis is present. Heart size is stable.  IMPRESSION: Endotracheal tube removed. No change in right hilar -infrahilar opacity consistent with mass by CT.   Electronically Signed   By: Ivar Drape M.D.   On: 08/11/2014 08:02   Dg Chest Port 1 View  08/10/2014   CLINICAL DATA:  Hemoptysis  EXAM: PORTABLE CHEST - 1 VIEW  COMPARISON:  08/09/2014  FINDINGS: An endotracheal tube and nasogastric catheter are noted in satisfactory position. The cardiac shadow is within normal limits. The lungs are well aerated bilaterally. Right perihilar mass lesion is again identified similar to that noted on previous chest x-ray and CT. No pneumothorax is identified.  IMPRESSION: Stable right perihilar mass lesion.  Tubes and lines as described.   Electronically Signed   By: Inez Catalina M.D.   On: 08/10/2014 08:13   Dg Chest Port 1 View  08/09/2014   CLINICAL DATA:  Intubation, status post bronchoscopy  EXAM: PORTABLE CHEST - 1 VIEW  COMPARISON:  08/06/2014  FINDINGS: Right hilar/right lower lobe mass reidentified. Endotracheal tube is appropriately positioned. Heart size upper limits of normal. No pneumothorax. No pleural effusion. Curvilinear left lower lobe atelectasis or scarring.  IMPRESSION: No pneumothorax after  bronchoscopy.   Electronically Signed   By: Conchita Paris M.D.   On: 08/09/2014 16:27   [place] [employer]  08/10/2014   INDICATION: No known malignancy, now with large (approximately 4.5 x 3.0 cm) spiculated mass within the superior segment of the right lower lobe. The page underwent attempted bronchoscopic biopsy, however the biopsy could not be performed secondary to active bleeding into the airway. As such, the patient was intubated for airway protection purposes.  The patient was observed uneventfully overnight in the ICU, though repeat bronchoscopy performed this morning demonstrated persistent bleeding from this infiltrative right lower lobe mass.  As such, request made for bronchial artery embolization in hopes of avoiding potential life threatening hemoptysis.  EXAM: 1. ULTRASOUND GUIDANCE FOR ARTERIAL ACCESS 2. SELECTIVE RIGHT BRONCHIAL ARTERIOGRAM (THIRD ORDER) AND FLUOROSCOPIC GUIDED PERCUTANEOUS PARTICLE EMBOLIZATION.  COMPARISON:  Chest CTA - 08/07/2014  MEDICATIONS: Fentanyl 100 mcg IV; Versed at 3 mg IV  Sedation time: 30 minutes  CONTRAST:  27m OMNIPAQUE IOHEXOL 300 MG/ML  SOLN  FLUOROSCOPY TIME:  6 minutes 36 seconds (3017mGy)  COMPLICATIONS: None immediate  TECHNIQUE: Informed written consent was obtained from the patient's family after a discussion of the risks, benefits and alternatives to treatment. Questions regarding the procedure were encouraged and answered. A timeout was  performed prior to the initiation of the procedure.  The right groin was prepped and draped in the usual sterile fashion, and a sterile drape was applied covering the operative field. Maximum barrier sterile technique with sterile gowns and gloves were used for the procedure. A timeout was performed prior to the initiation of the procedure.  The right femoral head was marked fluoroscopically. Under ultrasound guidance, the right common femoral artery was accessed with a  micropuncture kit after the overlying soft tissues were anesthetized with 1% lidocaine. An ultrasound image was saved for documentation purposes. The micropuncture sheath was exchanged for a 5 [place] vascular sheath over a Bentson wire. A closure arteriogram was performed through the side of the sheath confirming access within the right common femoral artery.  Over a Bentson wire, a Mickelson catheter was advanced to the level of the thoracic aorta where it was back bled and flushed. The catheter was then utilized to select the hypertrophied right bronchial artery. A selective right bronchial arteriogram was performed.  With the use of a 0.014 fathom wire, a high-flow Renegade micro catheter was advanced into the distal aspect of the right bronchial artery supplying the superior segment of the right lower lobe. Repeat sub selective arteriogram was performed.  The distal aspect of the inferior segment of the right bronchial artery was then subsequently percutaneously particle embolized with approximately 3/4 of 1 vial of 300-500 micron Embospheres. The micro catheter was retracted into the caudal aspect of the right bronchial artery and a post embolization arteriogram was performed.  Images reviewed and the procedure was terminated. All wires catheters and sheaths were removed from the patient. Hemostasis was achieved at the right groin access site with deployment of an Exoseal closure device. A dressing was placed. The patient tolerated the procedure well without immediate postprocedural complication.  FINDINGS: Selective right bronchial arteriogram demonstrates marked hypertrophy of this vessel as demonstrated on preceding chest CTA.  The caudal tributaries of the right bronchial artery were noted to supply an ill-defined area of hyperemia within the superior medial aspect of the right lower lobe correlating with the spiculated mass seen on preceding chest CT. Following percutaneous selective particle embolization,  there is near absence of flow into these abnormal distal vessels supplying the known spiculated mass.  IMPRESSION: Technically successful right bronchial artery percutaneous particle embolization for active bleeding from the known infiltrative mass within the superior segment of the right lower lobe.   Electronically Signed   By: Sandi Mariscal M.D.   On: 08/10/2014 18:18     CBC  Recent Labs Lab 08/11/14 0218 08/12/14 0537 08/13/14 0551 08/14/14 0518 08/15/14 0713  WBC 13.2* 18.7* 19.3* 14.4* 12.8*  HGB 10.9* 11.6* 10.9* 10.7* 11.3*  HCT 32.0* 34.1* 32.1* 32.0* 33.3*  PLT 393 399 432* 422* 444*  MCV 92.2 90.9 90.9 90.1 90.2  MCH 31.4 30.9 30.9 30.1 30.6  MCHC 34.1 34.0 34.0 33.4 33.9  RDW 12.9 13.1 13.1 13.0 13.0  LYMPHSABS 1.5  --   --   --   --   MONOABS 1.0  --   --   --   --   EOSABS 0.0  --   --   --   --   BASOSABS 0.0  --   --   --   --     Chemistries   Recent Labs Lab 08/10/14 0254 08/11/14 0218 08/12/14 0537 08/13/14 0551 08/14/14 0518  NA 135 139 136  --  135  K 3.8 3.3*  3.2* 3.2* 3.6  CL 103 108 101  --  100*  CO2 _0 --  26  GLUCOSE 93 101* 108*  --  120*  BUN <5* <5* <5*  --  <5*  CREATININE 0.46 0.44 0.42*  --  0.38*  CALCIUM 8.3* 8.2* 8.2*  --  8.4*  MG  --   --   --  1.8 2.1   ------------------------------------------------------------------------------------------------------------------ estimated creatinine clearance is 62.5 mL/min (by C-G formula based on Cr of 0.38). ------------------------------------------------------------------------------------------------------------------ No results for input(s): HGBA1C in the last 72 hours. ------------------------------------------------------------------------------------------------------------------ No results for input(s): CHOL, HDL, LDLCALC, TRIG, CHOLHDL, LDLDIRECT in the last 72  hours. ------------------------------------------------------------------------------------------------------------------ No results for input(s): TSH, T4TOTAL, T3FREE, THYROIDAB in the last 72 hours.  Invalid input(s): FREET3 ------------------------------------------------------------------------------------------------------------------ No results for input(s): VITAMINB12, FOLATE, FERRITIN, TIBC, IRON, RETICCTPCT in the last 72 hours.  Coagulation profile  Recent Labs Lab 08/10/14 1150  INR 1.17    No results for input(s): DDIMER in the last 72 hours.  Cardiac Enzymes No results for input(s): CKMB, TROPONINI, MYOGLOBIN in the last 168 hours.  Invalid input(s): CK ------------------------------------------------------------------------------------------------------------------ Invalid input(s): POCBNP   Time Spent in minutes  35   SINGH,PRASHANT K M.D on 08/15/2014 at 10:03 AM  Between 7am to 7pm - Pager - [phone]  After 7pm go to [email or web address] - password [facility]  [facility] -  Office  [phone]

## 2014-08-15 NOTE — Progress Notes (Signed)
Utilization Review completed. Tammy Ericsson RN BSN CM 

## 2014-08-16 ENCOUNTER — Inpatient Hospital Stay (HOSPITAL_COMMUNITY): Payer: No Typology Code available for payment source

## 2014-08-16 DIAGNOSIS — C3490 Malignant neoplasm of unspecified part of unspecified bronchus or lung: Secondary | ICD-10-CM

## 2014-08-16 DIAGNOSIS — R05 Cough: Secondary | ICD-10-CM

## 2014-08-16 DIAGNOSIS — C7951 Secondary malignant neoplasm of bone: Secondary | ICD-10-CM

## 2014-08-16 LAB — CBC
HEMATOCRIT: 33.3 % — AB (ref 36.0–46.0)
Hemoglobin: 10.9 g/dL — ABNORMAL LOW (ref 12.0–15.0)
MCH: 29.5 pg (ref 26.0–34.0)
MCHC: 32.7 g/dL (ref 30.0–36.0)
MCV: 90.2 fL (ref 78.0–100.0)
PLATELETS: 505 10*3/uL — AB (ref 150–400)
RBC: 3.69 MIL/uL — AB (ref 3.87–5.11)
RDW: 13 % (ref 11.5–15.5)
WBC: 12.6 10*3/uL — ABNORMAL HIGH (ref 4.0–10.5)

## 2014-08-16 LAB — BASIC METABOLIC PANEL
ANION GAP: 8 (ref 5–15)
CHLORIDE: 102 mmol/L (ref 101–111)
CO2: 26 mmol/L (ref 22–32)
CREATININE: 0.52 mg/dL (ref 0.44–1.00)
Calcium: 8.6 mg/dL — ABNORMAL LOW (ref 8.9–10.3)
Glucose, Bld: 100 mg/dL — ABNORMAL HIGH (ref 65–99)
Potassium: 3.5 mmol/L (ref 3.5–5.1)
SODIUM: 136 mmol/L (ref 135–145)

## 2014-08-16 MED ORDER — MIDAZOLAM HCL 2 MG/2ML IJ SOLN
INTRAMUSCULAR | Status: AC
  Filled 2014-08-16: qty 2

## 2014-08-16 MED ORDER — FENTANYL CITRATE (PF) 100 MCG/2ML IJ SOLN
INTRAMUSCULAR | Status: AC | PRN
  Administered 2014-08-16: 50 ug via INTRAVENOUS

## 2014-08-16 MED ORDER — GELATIN ABSORBABLE 12-7 MM EX MISC
CUTANEOUS | Status: AC
Start: 2014-08-16 — End: 2014-08-17
  Filled 2014-08-16: qty 1

## 2014-08-16 MED ORDER — SODIUM CHLORIDE 0.9 % IV SOLN
INTRAVENOUS | Status: DC
  Administered 2014-08-16: 10:00:00 via INTRAVENOUS

## 2014-08-16 MED ORDER — LIDOCAINE HCL 1 % IJ SOLN
INTRAMUSCULAR | Status: AC
  Filled 2014-08-16: qty 20

## 2014-08-16 MED ORDER — GADOBENATE DIMEGLUMINE 529 MG/ML IV SOLN
15.0000 mL | Freq: Once | INTRAVENOUS | Status: AC | PRN
  Administered 2014-08-16: 14 mL via INTRAVENOUS

## 2014-08-16 MED ORDER — FENTANYL CITRATE (PF) 100 MCG/2ML IJ SOLN
INTRAMUSCULAR | Status: AC
  Filled 2014-08-16: qty 2

## 2014-08-16 MED ORDER — MIDAZOLAM HCL 2 MG/2ML IJ SOLN
INTRAMUSCULAR | Status: AC | PRN
  Administered 2014-08-16 (×2): 1 mg via INTRAVENOUS

## 2014-08-16 NOTE — Progress Notes (Signed)
[facility] [phone]   [phone]  HOSPITAL CONSULT NOTE  REFERRING PHYSICIAN: Dr. Susa Raring  REASON FOR CONSULTATION:  44 years old Asian female with questionable metastatic lung cancer.  HPI Deborah Foster is a 44 y.o. female a never smoker with no significant past medical history. The patient presented to [facility] on 08/07/2014 complaining of 1 week cough productive cough as well as hemoptysis. She was seen by her primary care physician and was sent to the emergency department for further evaluation. CT scan of the chest on 08/07/2014 showed peripherally spiculated mass lesion measuring 4.4 x 3.0 x 3.6 cm in the right lower lobe emanating from the hilum inferiorly consistent with a primary pulmonary neoplasm. There was occlusion of the lower lobe bronchus as well as significant attenuation of the lower lobe pulmonary artery. There was filling defect identified within the bronchus intermedius consistent with local in growth. There was also right hilar adenopathy measuring 1.4 cm in short axis. Scanning of the upper abdomen demonstrate evidence for S3 0.8 cm mass in the left adrenal gland consistent with metastatic disease. The bony structures showed patchy lucency in the thoracic vertebral bodies consistent with metastatic disease. The patient was seen by Dr. Kendrick Fries and she underwent bronchoscopy but she had significant bleeding during the procedure. She underwent image guided bronchial artery embolization by interventional radiology. The cytology from the bronchoscopy was not conclusive for diagnosis of malignancy. CT scan of the abdomen and pelvis on 08/11/2014 showed left adrenal mass measuring 5.2 cm with central low density concerning for necrotic metastasis. There was osseous metastatic disease with a large destructive lesion involving the left acetabulum with soft tissue component medial and involvement of the superior articular surface.  There was additional osseous metastases in the left femoral neck, so I would support the pelvis and likely within L1 and L2 vertebral bodies. There was entered and extrahepatic biliary ductal dilatation without definable close and no gallbladder distention or pancreatic ductal dilatation. There was no obstructing pancreatic lesion. There was a tiny 2 mm subcapsular hypodensity in the right lobe of the liver that is too small to characterize and the small 4 mm hypodensity in the lower right kidney that is also too small to characterize. Earlier today the patient underwent CT-guided core biopsy of the left acetabular bone lesion by interventional radiology. The final pathology is still pending. Dr. Thedore Mins kindly asked me to see the patient today for evaluation and recommendation regarding her condition. When seen today the patient is feeling fine with no specific complaints except for the cough and mild shortness of breath as well as mild hemoptysis. She denied having any significant bony pains. She has no chest pain.  She denied having any significant weight loss or night sweats. No family history of malignancy. The patient is married and has 3 sons (ages 50, 52 and 83). Her husband and the 3 children were in the room. She works in nail care. She is originally from [place]. She has no history of smoking, no alcohol or drug abuse.  HPI  Past Medical History  Diagnosis Date  . Cancer     Past Surgical History  Procedure Laterality Date  . Video bronchoscopy Bilateral 08/09/2014    Procedure: VIDEO BRONCHOSCOPY WITHOUT FLUORO;  Surgeon: Lupita Leash, MD;  Location: [facility] ENDOSCOPY;  Service: Cardiopulmonary;  Laterality: Bilateral;    History reviewed. No pertinent family history.  Social History History  Substance Use Topics  .  Smoking status: Never Smoker   . Smokeless tobacco: Not on file  . Alcohol Use: No    No Known Allergies  Current Facility-Administered Medications  Medication  Dose Route Frequency Provider Last Rate Last Dose  . 0.9 %  sodium chloride infusion   Intravenous Continuous Thurnell Lose, MD 125 mL/hr at 08/16/14 0930    . acetaminophen (TYLENOL) tablet 650 mg  650 mg Oral Q6H PRN Oswald Hillock, MD   650 mg at 08/15/14 0033   Or  . acetaminophen (TYLENOL) suppository 650 mg  650 mg Rectal Q6H PRN Oswald Hillock, MD      . albuterol (PROVENTIL) (2.5 MG/3ML) 0.083% nebulizer solution 2.5 mg  2.5 mg Nebulization Q2H PRN Oswald Hillock, MD      . feeding supplement (BOOST / RESOURCE BREEZE) liquid 1 Container  1 Container Oral BID BM Rogue Bussing, RD   1 Container at 08/14/14 1817  . fentaNYL (SUBLIMAZE) 100 MCG/2ML injection           . gelatin adsorbable (GELFOAM/SURGIFOAM) 12-7 MM sponge 12-7 mm           . guaiFENesin (MUCINEX) 12 hr tablet 600 mg  600 mg Oral BID Gardiner Barefoot, NP   600 mg at 08/16/14 1000  . guaiFENesin-codeine 100-10 MG/5ML solution 10 mL  10 mL Oral Q4H PRN Thurnell Lose, MD   10 mL at 08/16/14 1303  . HYDROcodone-acetaminophen (NORCO) 10-325 MG per tablet 1 tablet  1 tablet Oral Q4H PRN Thurnell Lose, MD   1 tablet at 08/16/14 1242  . lactobacillus acidophilus (BACID) tablet 2 tablet  2 tablet Oral TID [name], MD   2 tablet at 08/16/14 1000  . lidocaine (XYLOCAINE) 1 % (with pres) injection           . midazolam (VERSED) 2 MG/2ML injection           . ondansetron (ZOFRAN) tablet 4 mg  4 mg Oral Q6H PRN Oswald Hillock, MD       Or  . ondansetron (ZOFRAN) injection 4 mg  4 mg Intravenous Q6H PRN Oswald Hillock, MD   4 mg at 08/10/14 1018  . piperacillin-tazobactam (ZOSYN) IVPB 3.375 g  3.375 g Intravenous Q8H Tyrone Apple, RPH   3.375 g at 08/16/14 9798  . vancomycin (VANCOCIN) IVPB 750 mg/150 ml premix  750 mg Intravenous Q12H Thuy D Dang, RPH   750 mg at 08/16/14 1533    Review of Systems  Constitutional: positive for fatigue Eyes: negative Ears, nose, mouth, throat, and face: negative Respiratory:  positive for cough, dyspnea on exertion and hemoptysis Cardiovascular: negative Gastrointestinal: negative Genitourinary:negative Integument/breast: negative Hematologic/lymphatic: negative Musculoskeletal:negative Neurological: negative Behavioral/Psych: negative Endocrine: negative Allergic/Immunologic: negative  Physical Exam  XQJ:JHERD, healthy, no distress, well nourished and well developed SKIN: skin color, texture, turgor are normal, no rashes or significant lesions HEAD: Normocephalic, No masses, lesions, tenderness or abnormalities EYES: normal, PERRLA, Conjunctiva are pink and non-injected EARS: External ears normal, Canals clear OROPHARYNX:no exudate, no erythema and lips, buccal mucosa, and tongue normal  NECK: supple, no adenopathy, no JVD LYMPH:  no palpable lymphadenopathy, no hepatosplenomegaly BREAST:not examined LUNGS: clear to auscultation , and palpation HEART: regular rate & rhythm, no murmurs and irregularly irregular ABDOMEN:abdomen soft, non-tender, normal bowel sounds and no masses or organomegaly BACK: Back symmetric, no curvature., No CVA tenderness EXTREMITIES:no joint deformities, effusion, or inflammation, no edema, no skin discoloration, no clubbing  NEURO: alert & oriented x 3 with fluent speech, no focal motor/sensory deficits  PERFORMANCE STATUS: ECOG 1  LABORATORY DATA: Lab Results  Component Value Date   WBC 12.6* 08/16/2014   HGB 10.9* 08/16/2014   HCT 33.3* 08/16/2014   MCV 90.2 08/16/2014   PLT 505* 08/16/2014    '@LASTCHEM'$ @  RADIOGRAPHIC STUDIES: Dg Chest 2 View  08/13/2014   CLINICAL DATA:  44 year old female with shortness of breath and hemoptysis for almost 1 week. Known right lower lobe mass. Status post recent right bronchial artery embolization on 08/10/2014.  EXAM: CHEST  2 VIEW  COMPARISON:  Recent prior chest x-ray 08/11/2014  FINDINGS: Masslike opacity in the right lower lobe again demonstrated without significant interval  change. Ill-defined airspace opacity in the periphery of the lesion likely consistent with a combination of postobstructive changes and possibly alveolar hemorrhage. The left lung remains relatively clear. The cardiac and mediastinal contours are unchanged. No acute osseous abnormality. Overall, very similar appearance of the chest. No new pneumothorax or pleural effusion.  IMPRESSION: Similar appearance of known right lower lobe pulmonary mass and surrounding airspace opacity likely reflecting a combination of postobstructive change and alveolar hemorrhage.  No significant interval change.   Electronically Signed   By: Jacqulynn Cadet M.D.   On: 08/13/2014 08:04   Dg Chest 2 View  08/06/2014   CLINICAL DATA:  Hemoptysis and chest pain  EXAM: CHEST  2 VIEW  COMPARISON:  None.  FINDINGS: There is focal airspace consolidation in the right middle lobe. Lungs elsewhere clear. Heart size and pulmonary vascularity are normal. No adenopathy. No bone lesions.  IMPRESSION: Right middle lobe pneumonia. Lungs otherwise clear. Followup PA and lateral chest radiographs recommended in 3-4 weeks following trial of antibiotic therapy to ensure resolution and exclude underlying malignancy.  These results will be called to the ordering clinician or representative by the Radiologist Assistant, and communication documented in the PACS or zVision Dashboard.   Electronically Signed   By: Lowella Grip III M.D.   On: 08/06/2014 17:22   Ct Angio Chest Pe W/cm &/or Wo Cm  08/07/2014   CLINICAL DATA:  Right middle lobe pneumonia on chest x-ray, recent hemoptysis  EXAM: CT ANGIOGRAPHY CHEST WITH CONTRAST  TECHNIQUE: Multidetector CT imaging of the chest was performed using the standard protocol during bolus administration of intravenous contrast. Multiplanar CT image reconstructions and MIPs were obtained to evaluate the vascular anatomy.  CONTRAST:  80mL OMNIPAQUE IOHEXOL 350 MG/ML SOLN  COMPARISON:  Chest x-ray from the previous  day  FINDINGS: Left lung is well aerated without evidence focal infiltrate or sizable effusion. The right lung is also well aerated and demonstrates some mild infiltrate within the right middle lobe. In the right lower lobe emanating from the hilum inferiorly there is a 4.4 by 3.0 by 3.6 cm peripherally spiculated mass lesion consistent with a primary pulmonary neoplasm. Distal to this there are changes of postobstructive infiltrate. There is occlusion of the lower lobe bronchus as well as significant attenuation of the lower lobe pulmonary artery. Filling defect is identified within the bronchus intermedius consistent with local in growth. Right hilar adenopathy measuring 14 mm in short axis is seen.  The thoracic aorta in its branches are within normal limits. Pulmonary artery demonstrates a normal branching pattern without evidence of pulmonary embolism. Narrowing is noted in the right lower lobe branches consistent with the underlying mass lesion.  Scanning into the upper abdomen demonstrates evidence of a 3.8 cm mass in the  left adrenal gland consistent with metastatic disease. The bony structures show patchy lucencies in the thoracic vertebral bodies consistent with metastatic disease.  Review of the MIP images confirms the above findings.  IMPRESSION: Right lower lobe mass lesion with postobstructive changes and invasion into the bronchus intermedius as described consistent with a primary pulmonary neoplasm. There are changes consistent with left adrenal metastatic disease as well as bony metastatic disease.  Mild right middle lobe infiltrate.   Electronically Signed   By: Inez Catalina M.D.   On: 08/07/2014 14:24   Ct Abdomen Pelvis W Contrast  08/11/2014   CLINICAL DATA:  Lung cancer. Recent diagnosis of right lower lobe mass with left adrenal nodule and bony metastatic disease.  EXAM: CT ABDOMEN AND PELVIS WITH CONTRAST  TECHNIQUE: Multidetector CT imaging of the abdomen and pelvis was performed using  the standard protocol following bolus administration of intravenous contrast.  CONTRAST:  29mL OMNIPAQUE IOHEXOL 300 MG/ML  SOLN  COMPARISON:  Chest CT 08/07/2014  FINDINGS: Lower chest: Right lower lobe mass is partially included, there is increased adjacent ground-glass opacity and consolidation, likely related to hemorrhage. No definite right pleural effusion. Minimal atelectasis in the left lower lobe with tiny subpulmonic left pleural effusion.  Hepatobiliary: Intra and extrahepatic biliary ductal dilatation, the common bile duct measures up to 1.7 cm proximally with tapering at the duodenum all insertion. Mild central intra and extrahepatic biliary ductal dilatation. The gallbladder is physiologically distended. Punctate, less than 2 mm hypodensity in the subcapsular right lobe of the liver. There is otherwise no focal hepatic lesion or signs of hepatic metastatic disease.  Pancreas: Normal, no pancreatic ductal dilatation. No focal pancreatic lesion, particularly at the pancreatic head.  Spleen: Normal in size without focal lesion.  Adrenals/Urinary Tract: Heterogeneous left adrenal mass measuring 5.2 x 2.5 cm with central low density component concerning for necrosis. The right adrenal gland appears normal. Kidneys demonstrate symmetric enhancement and excretion. No hydronephrosis. Mm cortical hypodensity in the lower right kidney, too small to characterize.  Stomach/Bowel: Stomach is physiologically distended. There are no dilated or thickened bowel loops. The appendix is normal. Small to moderate volume of colonic stool without colonic wall thickening.  Vascular/Lymphatic: Small retroperitoneal lymph nodes all measuring less than 3 mm in short axis dimension. No pathologic retroperitoneal, mesenteric, or inguinal adenopathy. The abdominal aorta is normal in caliber.  Reproductive: The uterus is normal for age. There is is 3 cm left ovarian cyst. Physiologic follicles in the right ovary. No adnexal mass.   Bladder:  Physiologically distended.  Other: No ascites.  Musculoskeletal: Osseous metastatic disease involving the left acetabulum measuring 4.0 x 3.7 cm. There is destruction of the medial cortex with soft tissue component and involvement of the superior articular surface. Cortical thinning laterally. Additional multifocal lytic lesions in the left ischium. Lytic lesion in the left femoral neck without definite cortical thinning. Sclerotic density in the right femoral neck. Additional small lucent lesions throughout the bony pelvis. Questionable small lytic lesions in L1 and L2 vertebral bodies.  IMPRESSION: 1. Left adrenal mass measures 5.2 cm with central low density concerning for necrotic metastasis. 2. Osseous metastatic disease. Large destructive lesion involving the left acetabulum with soft tissue component medial and involvement of the superior articular surface. Additional osseous metastasis in the left femoral neck, throughout the bony pelvis, and likely within L1 and L2 vertebral bodies. 3. Intra and extrahepatic biliary ductal dilatation without definable cause. No gallbladder distention or pancreatic ductal dilatation. No obstructing pancreatic  lesion. 4. Tiny 2 mm subcapsular hypodensity in the right lobe of the liver, too small to characterize. Small 4 mm hypodensity in the lower right kidney, also too small to characterize.   Electronically Signed   By: Jeb Levering M.D.   On: 08/11/2014 21:45   Ir Aorta/thoracic  08/10/2014   INDICATION: No known malignancy, now with large (approximately 4.5 x 3.0 cm) spiculated mass within the superior segment of the right lower lobe. The page underwent attempted bronchoscopic biopsy, however the biopsy could not be performed secondary to active bleeding into the airway. As such, the patient was intubated for airway protection purposes.  The patient was observed uneventfully overnight in the ICU, though repeat bronchoscopy performed this morning  demonstrated persistent bleeding from this infiltrative right lower lobe mass.  As such, request made for bronchial artery embolization in hopes of avoiding potential life threatening hemoptysis.  EXAM: 1. ULTRASOUND GUIDANCE FOR ARTERIAL ACCESS 2. SELECTIVE RIGHT BRONCHIAL ARTERIOGRAM (THIRD ORDER) AND FLUOROSCOPIC GUIDED PERCUTANEOUS PARTICLE EMBOLIZATION.  COMPARISON:  Chest CTA - 08/07/2014  MEDICATIONS: Fentanyl 100 mcg IV; Versed at 3 mg IV  Sedation time: 30 minutes  CONTRAST:  68mL OMNIPAQUE IOHEXOL 300 MG/ML  SOLN  FLUOROSCOPY TIME:  6 minutes 36 seconds (570 mGy)  COMPLICATIONS: None immediate  TECHNIQUE: Informed written consent was obtained from the patient's family after a discussion of the risks, benefits and alternatives to treatment. Questions regarding the procedure were encouraged and answered. A timeout was performed prior to the initiation of the procedure.  The right groin was prepped and draped in the usual sterile fashion, and a sterile drape was applied covering the operative field. Maximum barrier sterile technique with sterile gowns and gloves were used for the procedure. A timeout was performed prior to the initiation of the procedure.  The right femoral head was marked fluoroscopically. Under ultrasound guidance, the right common femoral artery was accessed with a micropuncture kit after the overlying soft tissues were anesthetized with 1% lidocaine. An ultrasound image was saved for documentation purposes. The micropuncture sheath was exchanged for a 5 [place] vascular sheath over a Bentson wire. A closure arteriogram was performed through the side of the sheath confirming access within the right common femoral artery.  Over a Bentson wire, a Mickelson catheter was advanced to the level of the thoracic aorta where it was back bled and flushed. The catheter was then utilized to select the hypertrophied right bronchial artery. A selective right bronchial arteriogram was performed.  With the  use of a 0.014 fathom wire, a high-flow Renegade micro catheter was advanced into the distal aspect of the right bronchial artery supplying the superior segment of the right lower lobe. Repeat sub selective arteriogram was performed.  The distal aspect of the inferior segment of the right bronchial artery was then subsequently percutaneously particle embolized with approximately 3/4 of 1 vial of 300-500 micron Embospheres. The micro catheter was retracted into the caudal aspect of the right bronchial artery and a post embolization arteriogram was performed.  Images reviewed and the procedure was terminated. All wires catheters and sheaths were removed from the patient. Hemostasis was achieved at the right groin access site with deployment of an Exoseal closure device. A dressing was placed. The patient tolerated the procedure well without immediate postprocedural complication.  FINDINGS: Selective right bronchial arteriogram demonstrates marked hypertrophy of this vessel as demonstrated on preceding chest CTA.  The caudal tributaries of the right bronchial artery were noted to supply an ill-defined area  of hyperemia within the superior medial aspect of the right lower lobe correlating with the spiculated mass seen on preceding chest CT. Following percutaneous selective particle embolization, there is near absence of flow into these abnormal distal vessels supplying the known spiculated mass.  IMPRESSION: Technically successful right bronchial artery percutaneous particle embolization for active bleeding from the known infiltrative mass within the superior segment of the right lower lobe.   Electronically Signed   By: Sandi Mariscal M.D.   On: 08/10/2014 18:18   Ir Angiogram Selective Each Additional Vessel  08/10/2014   INDICATION: No known malignancy, now with large (approximately 4.5 x 3.0 cm) spiculated mass within the superior segment of the right lower lobe. The page underwent attempted bronchoscopic biopsy,  however the biopsy could not be performed secondary to active bleeding into the airway. As such, the patient was intubated for airway protection purposes.  The patient was observed uneventfully overnight in the ICU, though repeat bronchoscopy performed this morning demonstrated persistent bleeding from this infiltrative right lower lobe mass.  As such, request made for bronchial artery embolization in hopes of avoiding potential life threatening hemoptysis.  EXAM: 1. ULTRASOUND GUIDANCE FOR ARTERIAL ACCESS 2. SELECTIVE RIGHT BRONCHIAL ARTERIOGRAM (THIRD ORDER) AND FLUOROSCOPIC GUIDED PERCUTANEOUS PARTICLE EMBOLIZATION.  COMPARISON:  Chest CTA - 08/07/2014  MEDICATIONS: Fentanyl 100 mcg IV; Versed at 3 mg IV  Sedation time: 30 minutes  CONTRAST:  20mL OMNIPAQUE IOHEXOL 300 MG/ML  SOLN  FLUOROSCOPY TIME:  6 minutes 36 seconds (774 mGy)  COMPLICATIONS: None immediate  TECHNIQUE: Informed written consent was obtained from the patient's family after a discussion of the risks, benefits and alternatives to treatment. Questions regarding the procedure were encouraged and answered. A timeout was performed prior to the initiation of the procedure.  The right groin was prepped and draped in the usual sterile fashion, and a sterile drape was applied covering the operative field. Maximum barrier sterile technique with sterile gowns and gloves were used for the procedure. A timeout was performed prior to the initiation of the procedure.  The right femoral head was marked fluoroscopically. Under ultrasound guidance, the right common femoral artery was accessed with a micropuncture kit after the overlying soft tissues were anesthetized with 1% lidocaine. An ultrasound image was saved for documentation purposes. The micropuncture sheath was exchanged for a 5 [place] vascular sheath over a Bentson wire. A closure arteriogram was performed through the side of the sheath confirming access within the right common femoral artery.  Over a  Bentson wire, a Mickelson catheter was advanced to the level of the thoracic aorta where it was back bled and flushed. The catheter was then utilized to select the hypertrophied right bronchial artery. A selective right bronchial arteriogram was performed.  With the use of a 0.014 fathom wire, a high-flow Renegade micro catheter was advanced into the distal aspect of the right bronchial artery supplying the superior segment of the right lower lobe. Repeat sub selective arteriogram was performed.  The distal aspect of the inferior segment of the right bronchial artery was then subsequently percutaneously particle embolized with approximately 3/4 of 1 vial of 300-500 micron Embospheres. The micro catheter was retracted into the caudal aspect of the right bronchial artery and a post embolization arteriogram was performed.  Images reviewed and the procedure was terminated. All wires catheters and sheaths were removed from the patient. Hemostasis was achieved at the right groin access site with deployment of an Exoseal closure device. A dressing was placed. The patient  tolerated the procedure well without immediate postprocedural complication.  FINDINGS: Selective right bronchial arteriogram demonstrates marked hypertrophy of this vessel as demonstrated on preceding chest CTA.  The caudal tributaries of the right bronchial artery were noted to supply an ill-defined area of hyperemia within the superior medial aspect of the right lower lobe correlating with the spiculated mass seen on preceding chest CT. Following percutaneous selective particle embolization, there is near absence of flow into these abnormal distal vessels supplying the known spiculated mass.  IMPRESSION: Technically successful right bronchial artery percutaneous particle embolization for active bleeding from the known infiltrative mass within the superior segment of the right lower lobe.   Electronically Signed   By: Sandi Mariscal M.D.   On: 08/10/2014  18:18   Ir Angiogram Follow Up Study  08/10/2014   INDICATION: No known malignancy, now with large (approximately 4.5 x 3.0 cm) spiculated mass within the superior segment of the right lower lobe. The page underwent attempted bronchoscopic biopsy, however the biopsy could not be performed secondary to active bleeding into the airway. As such, the patient was intubated for airway protection purposes.  The patient was observed uneventfully overnight in the ICU, though repeat bronchoscopy performed this morning demonstrated persistent bleeding from this infiltrative right lower lobe mass.  As such, request made for bronchial artery embolization in hopes of avoiding potential life threatening hemoptysis.  EXAM: 1. ULTRASOUND GUIDANCE FOR ARTERIAL ACCESS 2. SELECTIVE RIGHT BRONCHIAL ARTERIOGRAM (THIRD ORDER) AND FLUOROSCOPIC GUIDED PERCUTANEOUS PARTICLE EMBOLIZATION.  COMPARISON:  Chest CTA - 08/07/2014  MEDICATIONS: Fentanyl 100 mcg IV; Versed at 3 mg IV  Sedation time: 30 minutes  CONTRAST:  108mL OMNIPAQUE IOHEXOL 300 MG/ML  SOLN  FLUOROSCOPY TIME:  6 minutes 36 seconds (378 mGy)  COMPLICATIONS: None immediate  TECHNIQUE: Informed written consent was obtained from the patient's family after a discussion of the risks, benefits and alternatives to treatment. Questions regarding the procedure were encouraged and answered. A timeout was performed prior to the initiation of the procedure.  The right groin was prepped and draped in the usual sterile fashion, and a sterile drape was applied covering the operative field. Maximum barrier sterile technique with sterile gowns and gloves were used for the procedure. A timeout was performed prior to the initiation of the procedure.  The right femoral head was marked fluoroscopically. Under ultrasound guidance, the right common femoral artery was accessed with a micropuncture kit after the overlying soft tissues were anesthetized with 1% lidocaine. An ultrasound image was saved  for documentation purposes. The micropuncture sheath was exchanged for a 5 [place] vascular sheath over a Bentson wire. A closure arteriogram was performed through the side of the sheath confirming access within the right common femoral artery.  Over a Bentson wire, a Mickelson catheter was advanced to the level of the thoracic aorta where it was back bled and flushed. The catheter was then utilized to select the hypertrophied right bronchial artery. A selective right bronchial arteriogram was performed.  With the use of a 0.014 fathom wire, a high-flow Renegade micro catheter was advanced into the distal aspect of the right bronchial artery supplying the superior segment of the right lower lobe. Repeat sub selective arteriogram was performed.  The distal aspect of the inferior segment of the right bronchial artery was then subsequently percutaneously particle embolized with approximately 3/4 of 1 vial of 300-500 micron Embospheres. The micro catheter was retracted into the caudal aspect of the right bronchial artery and a post embolization arteriogram was  performed.  Images reviewed and the procedure was terminated. All wires catheters and sheaths were removed from the patient. Hemostasis was achieved at the right groin access site with deployment of an Exoseal closure device. A dressing was placed. The patient tolerated the procedure well without immediate postprocedural complication.  FINDINGS: Selective right bronchial arteriogram demonstrates marked hypertrophy of this vessel as demonstrated on preceding chest CTA.  The caudal tributaries of the right bronchial artery were noted to supply an ill-defined area of hyperemia within the superior medial aspect of the right lower lobe correlating with the spiculated mass seen on preceding chest CT. Following percutaneous selective particle embolization, there is near absence of flow into these abnormal distal vessels supplying the known spiculated mass.  IMPRESSION:  Technically successful right bronchial artery percutaneous particle embolization for active bleeding from the known infiltrative mass within the superior segment of the right lower lobe.   Electronically Signed   By: Sandi Mariscal M.D.   On: 08/10/2014 18:18   Ir US Guide Vasc Access Right  08/10/2014   INDICATION: No known malignancy, now with large (approximately 4.5 x 3.0 cm) spiculated mass within the superior segment of the right lower lobe. The page underwent attempted bronchoscopic biopsy, however the biopsy could not be performed secondary to active bleeding into the airway. As such, the patient was intubated for airway protection purposes.  The patient was observed uneventfully overnight in the ICU, though repeat bronchoscopy performed this morning demonstrated persistent bleeding from this infiltrative right lower lobe mass.  As such, request made for bronchial artery embolization in hopes of avoiding potential life threatening hemoptysis.  EXAM: 1. ULTRASOUND GUIDANCE FOR ARTERIAL ACCESS 2. SELECTIVE RIGHT BRONCHIAL ARTERIOGRAM (THIRD ORDER) AND FLUOROSCOPIC GUIDED PERCUTANEOUS PARTICLE EMBOLIZATION.  COMPARISON:  Chest CTA - 08/07/2014  MEDICATIONS: Fentanyl 100 mcg IV; Versed at 3 mg IV  Sedation time: 30 minutes  CONTRAST:  37mL OMNIPAQUE IOHEXOL 300 MG/ML  SOLN  FLUOROSCOPY TIME:  6 minutes 36 seconds (841 mGy)  COMPLICATIONS: None immediate  TECHNIQUE: Informed written consent was obtained from the patient's family after a discussion of the risks, benefits and alternatives to treatment. Questions regarding the procedure were encouraged and answered. A timeout was performed prior to the initiation of the procedure.  The right groin was prepped and draped in the usual sterile fashion, and a sterile drape was applied covering the operative field. Maximum barrier sterile technique with sterile gowns and gloves were used for the procedure. A timeout was performed prior to the initiation of the procedure.   The right femoral head was marked fluoroscopically. Under ultrasound guidance, the right common femoral artery was accessed with a micropuncture kit after the overlying soft tissues were anesthetized with 1% lidocaine. An ultrasound image was saved for documentation purposes. The micropuncture sheath was exchanged for a 5 [place] vascular sheath over a Bentson wire. A closure arteriogram was performed through the side of the sheath confirming access within the right common femoral artery.  Over a Bentson wire, a Mickelson catheter was advanced to the level of the thoracic aorta where it was back bled and flushed. The catheter was then utilized to select the hypertrophied right bronchial artery. A selective right bronchial arteriogram was performed.  With the use of a 0.014 fathom wire, a high-flow Renegade micro catheter was advanced into the distal aspect of the right bronchial artery supplying the superior segment of the right lower lobe. Repeat sub selective arteriogram was performed.  The distal aspect of the inferior  segment of the right bronchial artery was then subsequently percutaneously particle embolized with approximately 3/4 of 1 vial of 300-500 micron Embospheres. The micro catheter was retracted into the caudal aspect of the right bronchial artery and a post embolization arteriogram was performed.  Images reviewed and the procedure was terminated. All wires catheters and sheaths were removed from the patient. Hemostasis was achieved at the right groin access site with deployment of an Exoseal closure device. A dressing was placed. The patient tolerated the procedure well without immediate postprocedural complication.  FINDINGS: Selective right bronchial arteriogram demonstrates marked hypertrophy of this vessel as demonstrated on preceding chest CTA.  The caudal tributaries of the right bronchial artery were noted to supply an ill-defined area of hyperemia within the superior medial aspect of the right  lower lobe correlating with the spiculated mass seen on preceding chest CT. Following percutaneous selective particle embolization, there is near absence of flow into these abnormal distal vessels supplying the known spiculated mass.  IMPRESSION: Technically successful right bronchial artery percutaneous particle embolization for active bleeding from the known infiltrative mass within the superior segment of the right lower lobe.   Electronically Signed   By: Sandi Mariscal M.D.   On: 08/10/2014 18:18   Dg Chest Port 1 View  08/11/2014   CLINICAL DATA:  History of thumb opt this cysts  EXAM: PORTABLE CHEST - 1 VIEW  COMPARISON:  Portable chest x-ray of 08/10/2014 and CT chest of 08/07/2014  FINDINGS: The endotracheal tube has been removed. There is persistent opacity overlying the right hilum and infrahilar region consistent with the patient's known carcinoma. Mild left basilar linear atelectasis is present. Heart size is stable.  IMPRESSION: Endotracheal tube removed. No change in right hilar -infrahilar opacity consistent with mass by CT.   Electronically Signed   By: Ivar Drape M.D.   On: 08/11/2014 08:02   Dg Chest Port 1 View  08/10/2014   CLINICAL DATA:  Hemoptysis  EXAM: PORTABLE CHEST - 1 VIEW  COMPARISON:  08/09/2014  FINDINGS: An endotracheal tube and nasogastric catheter are noted in satisfactory position. The cardiac shadow is within normal limits. The lungs are well aerated bilaterally. Right perihilar mass lesion is again identified similar to that noted on previous chest x-ray and CT. No pneumothorax is identified.  IMPRESSION: Stable right perihilar mass lesion.  Tubes and lines as described.   Electronically Signed   By: Inez Catalina M.D.   On: 08/10/2014 08:13   Dg Chest Port 1 View  08/09/2014   CLINICAL DATA:  Intubation, status post bronchoscopy  EXAM: PORTABLE CHEST - 1 VIEW  COMPARISON:  08/06/2014  FINDINGS: Right hilar/right lower lobe mass reidentified. Endotracheal tube is  appropriately positioned. Heart size upper limits of normal. No pneumothorax. No pleural effusion. Curvilinear left lower lobe atelectasis or scarring.  IMPRESSION: No pneumothorax after bronchoscopy.   Electronically Signed   By: Conchita Paris M.D.   On: 08/09/2014 16:27   [place] Guide Roadmapping  08/10/2014   INDICATION: No known malignancy, now with large (approximately 4.5 x 3.0 cm) spiculated mass within the superior segment of the right lower lobe. The page underwent attempted bronchoscopic biopsy, however the biopsy could not be performed secondary to active bleeding into the airway. As such, the patient was intubated for airway protection purposes.  The patient was observed uneventfully overnight in the ICU, though repeat bronchoscopy performed this morning demonstrated persistent bleeding from this infiltrative right  lower lobe mass.  As such, request made for bronchial artery embolization in hopes of avoiding potential life threatening hemoptysis.  EXAM: 1. ULTRASOUND GUIDANCE FOR ARTERIAL ACCESS 2. SELECTIVE RIGHT BRONCHIAL ARTERIOGRAM (THIRD ORDER) AND FLUOROSCOPIC GUIDED PERCUTANEOUS PARTICLE EMBOLIZATION.  COMPARISON:  Chest CTA - 08/07/2014  MEDICATIONS: Fentanyl 100 mcg IV; Versed at 3 mg IV  Sedation time: 30 minutes  CONTRAST:  26mL OMNIPAQUE IOHEXOL 300 MG/ML  SOLN  FLUOROSCOPY TIME:  6 minutes 36 seconds (979 mGy)  COMPLICATIONS: None immediate  TECHNIQUE: Informed written consent was obtained from the patient's family after a discussion of the risks, benefits and alternatives to treatment. Questions regarding the procedure were encouraged and answered. A timeout was performed prior to the initiation of the procedure.  The right groin was prepped and draped in the usual sterile fashion, and a sterile drape was applied covering the operative field. Maximum barrier sterile technique with sterile gowns and gloves were used for the procedure. A timeout was  performed prior to the initiation of the procedure.  The right femoral head was marked fluoroscopically. Under ultrasound guidance, the right common femoral artery was accessed with a micropuncture kit after the overlying soft tissues were anesthetized with 1% lidocaine. An ultrasound image was saved for documentation purposes. The micropuncture sheath was exchanged for a 5 [place] vascular sheath over a Bentson wire. A closure arteriogram was performed through the side of the sheath confirming access within the right common femoral artery.  Over a Bentson wire, a Mickelson catheter was advanced to the level of the thoracic aorta where it was back bled and flushed. The catheter was then utilized to select the hypertrophied right bronchial artery. A selective right bronchial arteriogram was performed.  With the use of a 0.014 fathom wire, a high-flow Renegade micro catheter was advanced into the distal aspect of the right bronchial artery supplying the superior segment of the right lower lobe. Repeat sub selective arteriogram was performed.  The distal aspect of the inferior segment of the right bronchial artery was then subsequently percutaneously particle embolized with approximately 3/4 of 1 vial of 300-500 micron Embospheres. The micro catheter was retracted into the caudal aspect of the right bronchial artery and a post embolization arteriogram was performed.  Images reviewed and the procedure was terminated. All wires catheters and sheaths were removed from the patient. Hemostasis was achieved at the right groin access site with deployment of an Exoseal closure device. A dressing was placed. The patient tolerated the procedure well without immediate postprocedural complication.  FINDINGS: Selective right bronchial arteriogram demonstrates marked hypertrophy of this vessel as demonstrated on preceding chest CTA.  The caudal tributaries of the right bronchial artery were noted to supply an ill-defined area of  hyperemia within the superior medial aspect of the right lower lobe correlating with the spiculated mass seen on preceding chest CT. Following percutaneous selective particle embolization, there is near absence of flow into these abnormal distal vessels supplying the known spiculated mass.  IMPRESSION: Technically successful right bronchial artery percutaneous particle embolization for active bleeding from the known infiltrative mass within the superior segment of the right lower lobe.   Electronically Signed   By: Sandi Mariscal M.D.   On: 08/10/2014 18:18    ASSESSMENT: This is a very pleasant 44 years old Asian female with highly suspicious metastatic non-small cell lung cancer, likely adenocarcinoma and the patient with no smoking history. She underwent CT-guided core biopsy of the soft tissue lesion of the left acetabular bone.  The final pathology is still pending.    PLAN: I had a lengthy discussion with the patient and her family today about her current condition. I will wait for the final pathology before giving recommendation regarding treatment of her condition. I will complete the staging workup by ordering a MRI of the brain to rule out brain metastasis. I will also consult radiation oncology for evaluation of the patient and consideration of palliative radiotherapy to the obstructing lung mass causing hemoptysis as well as the large metastatic bone lesion in the left acetabulum and hip area. If the final pathology was consistent with adenocarcinoma, we will send the tissue block to be tested for molecular biomarker, like EGFR mutation as well as ALK gene translocation, among other mutations. I will arrange for the patient to have a follow-up appointment with me at the [facility] within few days after her discharge from the hospital. The patient voices understanding of current disease status and treatment options and is in agreement with the current care plan.  All questions  were answered. The patient knows to call the clinic with any problems, questions or concerns. We can certainly see the patient much sooner if necessary.  Thank you so much for allowing me to participate in the care of Deborah Foster. I will continue to follow up the patient with you and assist in her care.   Disclaimer: This note was dictated with voice recognition software. Similar sounding words can inadvertently be transcribed and may not be corrected upon review.   Micala Saltsman K. August 16, 2014, 4:40 PM

## 2014-08-16 NOTE — Progress Notes (Signed)
Patient Demographics:    Teniola Tseng, is a 44 y.o. female, DOB - 10-04-1970, NTZ:[id number]  Admit date - 08/07/2014   Admitting Physician Oswald Hillock, MD  Outpatient Primary MD for the patient is [name], MD  LOS - 9   Chief Complaint  Patient presents with  . Cough        Subjective:    Hollyann Torrisi today has, No headache, No chest pain, No abdominal pain - No Nausea, No new weakness tingling or numbness, mild  Cough - but no SOB.  No frank hemoptysis.   Assessment  & Plan :     1. 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   She was extubated on 08/11/2014. No biopsies were done due to ongoing bleeding during bronchoscopy. CT abdomen and pelvis showed left adrenal mass and currently IR has been requested to biopsy that lesion. IR wants to biopsy on 08/16/2014 after HCAP is clinically improved. Have also consulted oncologist Dr. Earlie Server on 08/16/2014.  So far clinically this looks like primary bronchogenic carcinoma with metastasis to left adrenal gland, thoracic vertebra, left hip along with possibly kidney.   2. Possible postobstructive pneumonia -  HCAP . Worsening leukocytosis, persistent infiltrate, low-grade fevers, did have bronchoscopy with endotracheal intubation and mechanical ventilation. Much improved on HCAP anti-biotics. Fevers resolved, afebrile, feels better. Repeat sputum culture unremarkable, UA stable.     3. Hypokalemia. Replaced. Stable.   4. Some new onset mid back pain. Likely pain  from T spine metastasis. Lortab added.    Code Status : Full  Family Communication  : none present  Disposition Plan  : Home  2 days  Consults  :  PCCM, IR, Onco  Procedures  :   CT chest 7/23 > Right lower lobe mass lesion with postobstructive changes and invasion into the bronchus intermedius. changes consistent with left adrenal metastatic disease as well as bony metastatic disease. Mild right middle lobe infiltrate.  7/25 bronchoscopy> large clot in the right lower lobe, significant bleeding with suctioning, bleeding controlled; intubated for airway protection, ICU monitoring.  7/26 Bronchoscopy> minor bleeding noted with suctioning, resolved spontaneously;   7/26 IR bronchial artery embolization; Extubated post procedure  CT Ab/Pelvis 7/27 > left adrenal mass, left acetabular metastatic lesion. Intra-and extrahepatic duct dilation.  CT-guided tissue biopsy ordered for 08/12/2014   DVT Prophylaxis  :    SCDs   Lab Results  Component Value Date   PLT 505* 08/16/2014    Inpatient Medications  Scheduled Meds: . feeding supplement  1 Container Oral BID BM  . guaiFENesin  600 mg Oral BID  . lactobacillus acidophilus  2 tablet Oral TID  . piperacillin-tazobactam (ZOSYN)  IV  3.375 g Intravenous Q8H  . vancomycin  750 mg Intravenous Q12H   Continuous Infusions: . sodium chloride     PRN Meds:.acetaminophen **OR** acetaminophen, albuterol, guaiFENesin-codeine, HYDROcodone-acetaminophen, ondansetron **OR** ondansetron (ZOFRAN) IV  Antibiotics  :     Anti-infectives    Start  Dose/Rate Route Frequency Ordered Stop   08/14/14 0100  vancomycin (VANCOCIN) IVPB 750 mg/150 ml premix     750 mg 150 mL/hr over 60 Minutes Intravenous Every 12 hours 08/13/14 1157     08/13/14 1300  piperacillin-tazobactam (ZOSYN) IVPB 3.375 g     3.375 g 12.5 mL/hr over 240 Minutes Intravenous Every 8 hours 08/13/14 1157     08/13/14 1200  vancomycin (VANCOCIN) IVPB 1000 mg/200 mL premix      1,000 mg 200 mL/hr over 60 Minutes Intravenous  Once 08/13/14 1157 08/13/14 1318   08/08/14 1600  azithromycin (ZITHROMAX) 500 mg in dextrose 5 % 250 mL IVPB  Status:  Discontinued     500 mg 250 mL/hr over 60 Minutes Intravenous Every 24 hours 08/07/14 1639 08/08/14 1052   08/08/14 1500  cefTRIAXone (ROCEPHIN) 1 g in dextrose 5 % 50 mL IVPB - Premix  Status:  Discontinued     1 g 100 mL/hr over 30 Minutes Intravenous Every 24 hours 08/07/14 1654 08/08/14 1052   08/08/14 1200  ampicillin-sulbactam (UNASYN) 1.5 g in sodium chloride 0.9 % 50 mL IVPB  Status:  Discontinued     1.5 g 100 mL/hr over 30 Minutes Intravenous Every 6 hours 08/08/14 1124 08/13/14 1138   08/07/14 1515  cefTRIAXone (ROCEPHIN) 1 g in dextrose 5 % 50 mL IVPB     1 g 100 mL/hr over 30 Minutes Intravenous  Once 08/07/14 1501 08/07/14 1549   08/07/14 1515  azithromycin (ZITHROMAX) 500 mg in dextrose 5 % 250 mL IVPB     500 mg 250 mL/hr over 60 Minutes Intravenous  Once 08/07/14 1501 08/07/14 1721        Objective:   Filed Vitals:   08/15/14 0553 08/15/14 1449 08/15/14 2040 08/16/14 0643  BP: 99/53 100/56 100/66 98/55  Pulse: 88 97 90 88  Temp: 98.6 F (37 C) 98.2 F (36.8 C) 98.6 F (37 C) 99.1 F (37.3 C)  TempSrc: Oral Oral Oral Oral  Resp: $Remo'18 20 16 16  'pYxfH$ Height:      Weight:      SpO2: 98% 99% 98% 97%    Wt Readings from Last 3 Encounters:  08/11/14 51.8 kg (114 lb 3.2 oz)     Intake/Output Summary (Last 24 hours) at 08/16/14 0928 Last data filed at 08/16/14 0931  Gross per 24 hour  Intake    520 ml  Output   1400 ml  Net   -880 ml     Physical Exam  Awake Alert, Oriented X 3, No new F.N deficits, Normal affect [place].AT,PERRAL Supple Neck,No JVD, No cervical lymphadenopathy appriciated.  Symmetrical Chest wall movement, Good air movement bilaterally, CTAB RRR,No Gallops,Rubs or new Murmurs, No Parasternal Heave +ve B.Sounds, Abd Soft, No tenderness, No organomegaly appriciated, No rebound  - guarding or rigidity. No Cyanosis, Clubbing or edema, No new Rash or bruise       Data Review:   Micro Results Recent Results (from the past 240 hour(s))  Culture, blood (routine x 2)     Status: None   Collection Time: 08/07/14  5:10 PM  Result Value Ref Range Status   Specimen Description BLOOD RIGHT HAND  Final   Special Requests BOTTLES DRAWN AEROBIC AND ANAEROBIC 10CC  Final   Culture NO GROWTH 5 DAYS  Final   Report Status 08/12/2014 FINAL  Final  Culture, blood (routine x 2)     Status: None   Collection Time: 08/07/14  5:13 PM  Result  Value Ref Range Status   Specimen Description BLOOD LEFT WRIST  Final   Special Requests BOTTLES DRAWN AEROBIC AND ANAEROBIC 10CC  Final   Culture NO GROWTH 5 DAYS  Final   Report Status 08/12/2014 FINAL  Final  AFB culture with smear     Status: None (Preliminary result)   Collection Time: 08/09/14  3:30 PM  Result Value Ref Range Status   Specimen Description BRONCHIAL WASHINGS  Final   Special Requests Normal  Final   Acid Fast Smear   Final    NO ACID FAST BACILLI SEEN Performed at [facility]    Culture   Final    CULTURE WILL BE EXAMINED FOR 6 WEEKS BEFORE ISSUING A FINAL REPORT Performed at [facility]    Report Status PENDING  Incomplete  Fungus Culture with Smear     Status: None (Preliminary result)   Collection Time: 08/09/14  3:30 PM  Result Value Ref Range Status   Specimen Description BRONCHIAL WASHINGS  Final   Special Requests Normal  Final   Fungal Smear   Final    NO YEAST OR FUNGAL ELEMENTS SEEN Performed at [facility]    Culture   Final    CULTURE IN PROGRESS FOR FOUR WEEKS Performed at [facility]    Report Status PENDING  Incomplete  MRSA PCR Screening     Status: None   Collection Time: 08/09/14  7:30 PM  Result Value Ref Range Status   MRSA by PCR NEGATIVE NEGATIVE Final    Comment:        The GeneXpert MRSA Assay (FDA approved for NASAL specimens only), is  one component of a comprehensive MRSA colonization surveillance program. It is not intended to diagnose MRSA infection nor to guide or monitor treatment for MRSA infections.   Urine culture     Status: None   Collection Time: 08/13/14 10:58 AM  Result Value Ref Range Status   Specimen Description URINE, RANDOM  Final   Special Requests NONE  Final   Culture 20,000 COLONIES/mL YEAST  Final   Report Status 08/14/2014 FINAL  Final  Culture, expectorated sputum-assessment     Status: None   Collection Time: 08/13/14  1:54 PM  Result Value Ref Range Status   Specimen Description SPUTUM  Final   Special Requests NONE  Final   Sputum evaluation   Final    THIS SPECIMEN IS ACCEPTABLE. RESPIRATORY CULTURE REPORT TO FOLLOW.   Report Status 08/13/2014 FINAL  Final  Culture, respiratory (NON-Expectorated)     Status: None   Collection Time: 08/13/14  1:54 PM  Result Value Ref Range Status   Specimen Description SPUTUM  Final   Special Requests NONE  Final   Gram Stain   Final    RARE WBC PRESENT, PREDOMINANTLY MONONUCLEAR RARE SQUAMOUS EPITHELIAL CELLS PRESENT FEW GRAM POSITIVE COCCI IN PAIRS IN CHAINS IN CLUSTERS FEW GRAM NEGATIVE RODS Performed at [facility]    Culture   Final    NORMAL OROPHARYNGEAL FLORA Performed at [facility]    Report Status 08/15/2014 FINAL  Final    Radiology Reports Dg Chest 2 View  08/13/2014   CLINICAL DATA:  44 year old female with shortness of breath and hemoptysis for almost 1 week. Known right lower lobe mass. Status post recent right bronchial artery embolization on 08/10/2014.  EXAM: CHEST  2 VIEW  COMPARISON:  Recent prior chest x-ray 08/11/2014  FINDINGS: Masslike opacity in the right lower  lobe again demonstrated without significant interval change. Ill-defined airspace opacity in the periphery of the lesion likely consistent with a combination of postobstructive changes and possibly alveolar hemorrhage. The left lung remains  relatively clear. The cardiac and mediastinal contours are unchanged. No acute osseous abnormality. Overall, very similar appearance of the chest. No new pneumothorax or pleural effusion.  IMPRESSION: Similar appearance of known right lower lobe pulmonary mass and surrounding airspace opacity likely reflecting a combination of postobstructive change and alveolar hemorrhage.  No significant interval change.   Electronically Signed   By: Jacqulynn Cadet M.D.   On: 08/13/2014 08:04   Dg Chest 2 View  08/06/2014   CLINICAL DATA:  Hemoptysis and chest pain  EXAM: CHEST  2 VIEW  COMPARISON:  None.  FINDINGS: There is focal airspace consolidation in the right middle lobe. Lungs elsewhere clear. Heart size and pulmonary vascularity are normal. No adenopathy. No bone lesions.  IMPRESSION: Right middle lobe pneumonia. Lungs otherwise clear. Followup PA and lateral chest radiographs recommended in 3-4 weeks following trial of antibiotic therapy to ensure resolution and exclude underlying malignancy.  These results will be called to the ordering clinician or representative by the Radiologist Assistant, and communication documented in the PACS or zVision Dashboard.   Electronically Signed   By: Lowella Grip III M.D.   On: 08/06/2014 17:22   Ct Angio Chest Pe W/cm &/or Wo Cm  08/07/2014   CLINICAL DATA:  Right middle lobe pneumonia on chest x-ray, recent hemoptysis  EXAM: CT ANGIOGRAPHY CHEST WITH CONTRAST  TECHNIQUE: Multidetector CT imaging of the chest was performed using the standard protocol during bolus administration of intravenous contrast. Multiplanar CT image reconstructions and MIPs were obtained to evaluate the vascular anatomy.  CONTRAST:  61mL OMNIPAQUE IOHEXOL 350 MG/ML SOLN  COMPARISON:  Chest x-ray from the previous day  FINDINGS: Left lung is well aerated without evidence focal infiltrate or sizable effusion. The right lung is also well aerated and demonstrates some mild infiltrate within the right  middle lobe. In the right lower lobe emanating from the hilum inferiorly there is a 4.4 by 3.0 by 3.6 cm peripherally spiculated mass lesion consistent with a primary pulmonary neoplasm. Distal to this there are changes of postobstructive infiltrate. There is occlusion of the lower lobe bronchus as well as significant attenuation of the lower lobe pulmonary artery. Filling defect is identified within the bronchus intermedius consistent with local in growth. Right hilar adenopathy measuring 14 mm in short axis is seen.  The thoracic aorta in its branches are within normal limits. Pulmonary artery demonstrates a normal branching pattern without evidence of pulmonary embolism. Narrowing is noted in the right lower lobe branches consistent with the underlying mass lesion.  Scanning into the upper abdomen demonstrates evidence of a 3.8 cm mass in the left adrenal gland consistent with metastatic disease. The bony structures show patchy lucencies in the thoracic vertebral bodies consistent with metastatic disease.  Review of the MIP images confirms the above findings.  IMPRESSION: Right lower lobe mass lesion with postobstructive changes and invasion into the bronchus intermedius as described consistent with a primary pulmonary neoplasm. There are changes consistent with left adrenal metastatic disease as well as bony metastatic disease.  Mild right middle lobe infiltrate.   Electronically Signed   By: Inez Catalina M.D.   On: 08/07/2014 14:24   Ct Abdomen Pelvis W Contrast  08/11/2014   CLINICAL DATA:  Lung cancer. Recent diagnosis of right lower lobe mass with left  adrenal nodule and bony metastatic disease.  EXAM: CT ABDOMEN AND PELVIS WITH CONTRAST  TECHNIQUE: Multidetector CT imaging of the abdomen and pelvis was performed using the standard protocol following bolus administration of intravenous contrast.  CONTRAST:  24mL OMNIPAQUE IOHEXOL 300 MG/ML  SOLN  COMPARISON:  Chest CT 08/07/2014  FINDINGS: Lower chest:  Right lower lobe mass is partially included, there is increased adjacent ground-glass opacity and consolidation, likely related to hemorrhage. No definite right pleural effusion. Minimal atelectasis in the left lower lobe with tiny subpulmonic left pleural effusion.  Hepatobiliary: Intra and extrahepatic biliary ductal dilatation, the common bile duct measures up to 1.7 cm proximally with tapering at the duodenum all insertion. Mild central intra and extrahepatic biliary ductal dilatation. The gallbladder is physiologically distended. Punctate, less than 2 mm hypodensity in the subcapsular right lobe of the liver. There is otherwise no focal hepatic lesion or signs of hepatic metastatic disease.  Pancreas: Normal, no pancreatic ductal dilatation. No focal pancreatic lesion, particularly at the pancreatic head.  Spleen: Normal in size without focal lesion.  Adrenals/Urinary Tract: Heterogeneous left adrenal mass measuring 5.2 x 2.5 cm with central low density component concerning for necrosis. The right adrenal gland appears normal. Kidneys demonstrate symmetric enhancement and excretion. No hydronephrosis. Mm cortical hypodensity in the lower right kidney, too small to characterize.  Stomach/Bowel: Stomach is physiologically distended. There are no dilated or thickened bowel loops. The appendix is normal. Small to moderate volume of colonic stool without colonic wall thickening.  Vascular/Lymphatic: Small retroperitoneal lymph nodes all measuring less than 3 mm in short axis dimension. No pathologic retroperitoneal, mesenteric, or inguinal adenopathy. The abdominal aorta is normal in caliber.  Reproductive: The uterus is normal for age. There is is 3 cm left ovarian cyst. Physiologic follicles in the right ovary. No adnexal mass.  Bladder:  Physiologically distended.  Other: No ascites.  Musculoskeletal: Osseous metastatic disease involving the left acetabulum measuring 4.0 x 3.7 cm. There is destruction of the  medial cortex with soft tissue component and involvement of the superior articular surface. Cortical thinning laterally. Additional multifocal lytic lesions in the left ischium. Lytic lesion in the left femoral neck without definite cortical thinning. Sclerotic density in the right femoral neck. Additional small lucent lesions throughout the bony pelvis. Questionable small lytic lesions in L1 and L2 vertebral bodies.  IMPRESSION: 1. Left adrenal mass measures 5.2 cm with central low density concerning for necrotic metastasis. 2. Osseous metastatic disease. Large destructive lesion involving the left acetabulum with soft tissue component medial and involvement of the superior articular surface. Additional osseous metastasis in the left femoral neck, throughout the bony pelvis, and likely within L1 and L2 vertebral bodies. 3. Intra and extrahepatic biliary ductal dilatation without definable cause. No gallbladder distention or pancreatic ductal dilatation. No obstructing pancreatic lesion. 4. Tiny 2 mm subcapsular hypodensity in the right lobe of the liver, too small to characterize. Small 4 mm hypodensity in the lower right kidney, also too small to characterize.   Electronically Signed   By: Jeb Levering M.D.   On: 08/11/2014 21:45   Ir Aorta/thoracic  08/10/2014   INDICATION: No known malignancy, now with large (approximately 4.5 x 3.0 cm) spiculated mass within the superior segment of the right lower lobe. The page underwent attempted bronchoscopic biopsy, however the biopsy could not be performed secondary to active bleeding into the airway. As such, the patient was intubated for airway protection purposes.  The patient was observed uneventfully overnight in the  ICU, though repeat bronchoscopy performed this morning demonstrated persistent bleeding from this infiltrative right lower lobe mass.  As such, request made for bronchial artery embolization in hopes of avoiding potential life threatening  hemoptysis.  EXAM: 1. ULTRASOUND GUIDANCE FOR ARTERIAL ACCESS 2. SELECTIVE RIGHT BRONCHIAL ARTERIOGRAM (THIRD ORDER) AND FLUOROSCOPIC GUIDED PERCUTANEOUS PARTICLE EMBOLIZATION.  COMPARISON:  Chest CTA - 08/07/2014  MEDICATIONS: Fentanyl 100 mcg IV; Versed at 3 mg IV  Sedation time: 30 minutes  CONTRAST:  36mL OMNIPAQUE IOHEXOL 300 MG/ML  SOLN  FLUOROSCOPY TIME:  6 minutes 36 seconds (951 mGy)  COMPLICATIONS: None immediate  TECHNIQUE: Informed written consent was obtained from the patient's family after a discussion of the risks, benefits and alternatives to treatment. Questions regarding the procedure were encouraged and answered. A timeout was performed prior to the initiation of the procedure.  The right groin was prepped and draped in the usual sterile fashion, and a sterile drape was applied covering the operative field. Maximum barrier sterile technique with sterile gowns and gloves were used for the procedure. A timeout was performed prior to the initiation of the procedure.  The right femoral head was marked fluoroscopically. Under ultrasound guidance, the right common femoral artery was accessed with a micropuncture kit after the overlying soft tissues were anesthetized with 1% lidocaine. An ultrasound image was saved for documentation purposes. The micropuncture sheath was exchanged for a 5 [place] vascular sheath over a Bentson wire. A closure arteriogram was performed through the side of the sheath confirming access within the right common femoral artery.  Over a Bentson wire, a Mickelson catheter was advanced to the level of the thoracic aorta where it was back bled and flushed. The catheter was then utilized to select the hypertrophied right bronchial artery. A selective right bronchial arteriogram was performed.  With the use of a 0.014 fathom wire, a high-flow Renegade micro catheter was advanced into the distal aspect of the right bronchial artery supplying the superior segment of the right lower  lobe. Repeat sub selective arteriogram was performed.  The distal aspect of the inferior segment of the right bronchial artery was then subsequently percutaneously particle embolized with approximately 3/4 of 1 vial of 300-500 micron Embospheres. The micro catheter was retracted into the caudal aspect of the right bronchial artery and a post embolization arteriogram was performed.  Images reviewed and the procedure was terminated. All wires catheters and sheaths were removed from the patient. Hemostasis was achieved at the right groin access site with deployment of an Exoseal closure device. A dressing was placed. The patient tolerated the procedure well without immediate postprocedural complication.  FINDINGS: Selective right bronchial arteriogram demonstrates marked hypertrophy of this vessel as demonstrated on preceding chest CTA.  The caudal tributaries of the right bronchial artery were noted to supply an ill-defined area of hyperemia within the superior medial aspect of the right lower lobe correlating with the spiculated mass seen on preceding chest CT. Following percutaneous selective particle embolization, there is near absence of flow into these abnormal distal vessels supplying the known spiculated mass.  IMPRESSION: Technically successful right bronchial artery percutaneous particle embolization for active bleeding from the known infiltrative mass within the superior segment of the right lower lobe.   Electronically Signed   By: Sandi Mariscal M.D.   On: 08/10/2014 18:18   Ir Angiogram Selective Each Additional Vessel  08/10/2014   INDICATION: No known malignancy, now with large (approximately 4.5 x 3.0 cm) spiculated mass within the superior segment of the  right lower lobe. The page underwent attempted bronchoscopic biopsy, however the biopsy could not be performed secondary to active bleeding into the airway. As such, the patient was intubated for airway protection purposes.  The patient was observed  uneventfully overnight in the ICU, though repeat bronchoscopy performed this morning demonstrated persistent bleeding from this infiltrative right lower lobe mass.  As such, request made for bronchial artery embolization in hopes of avoiding potential life threatening hemoptysis.  EXAM: 1. ULTRASOUND GUIDANCE FOR ARTERIAL ACCESS 2. SELECTIVE RIGHT BRONCHIAL ARTERIOGRAM (THIRD ORDER) AND FLUOROSCOPIC GUIDED PERCUTANEOUS PARTICLE EMBOLIZATION.  COMPARISON:  Chest CTA - 08/07/2014  MEDICATIONS: Fentanyl 100 mcg IV; Versed at 3 mg IV  Sedation time: 30 minutes  CONTRAST:  45mL OMNIPAQUE IOHEXOL 300 MG/ML  SOLN  FLUOROSCOPY TIME:  6 minutes 36 seconds (361 mGy)  COMPLICATIONS: None immediate  TECHNIQUE: Informed written consent was obtained from the patient's family after a discussion of the risks, benefits and alternatives to treatment. Questions regarding the procedure were encouraged and answered. A timeout was performed prior to the initiation of the procedure.  The right groin was prepped and draped in the usual sterile fashion, and a sterile drape was applied covering the operative field. Maximum barrier sterile technique with sterile gowns and gloves were used for the procedure. A timeout was performed prior to the initiation of the procedure.  The right femoral head was marked fluoroscopically. Under ultrasound guidance, the right common femoral artery was accessed with a micropuncture kit after the overlying soft tissues were anesthetized with 1% lidocaine. An ultrasound image was saved for documentation purposes. The micropuncture sheath was exchanged for a 5 [place] vascular sheath over a Bentson wire. A closure arteriogram was performed through the side of the sheath confirming access within the right common femoral artery.  Over a Bentson wire, a Mickelson catheter was advanced to the level of the thoracic aorta where it was back bled and flushed. The catheter was then utilized to select the hypertrophied  right bronchial artery. A selective right bronchial arteriogram was performed.  With the use of a 0.014 fathom wire, a high-flow Renegade micro catheter was advanced into the distal aspect of the right bronchial artery supplying the superior segment of the right lower lobe. Repeat sub selective arteriogram was performed.  The distal aspect of the inferior segment of the right bronchial artery was then subsequently percutaneously particle embolized with approximately 3/4 of 1 vial of 300-500 micron Embospheres. The micro catheter was retracted into the caudal aspect of the right bronchial artery and a post embolization arteriogram was performed.  Images reviewed and the procedure was terminated. All wires catheters and sheaths were removed from the patient. Hemostasis was achieved at the right groin access site with deployment of an Exoseal closure device. A dressing was placed. The patient tolerated the procedure well without immediate postprocedural complication.  FINDINGS: Selective right bronchial arteriogram demonstrates marked hypertrophy of this vessel as demonstrated on preceding chest CTA.  The caudal tributaries of the right bronchial artery were noted to supply an ill-defined area of hyperemia within the superior medial aspect of the right lower lobe correlating with the spiculated mass seen on preceding chest CT. Following percutaneous selective particle embolization, there is near absence of flow into these abnormal distal vessels supplying the known spiculated mass.  IMPRESSION: Technically successful right bronchial artery percutaneous particle embolization for active bleeding from the known infiltrative mass within the superior segment of the right lower lobe.   Electronically Signed  By: Sandi Mariscal M.D.   On: 08/10/2014 18:18   Ir Angiogram Follow Up Study  08/10/2014   INDICATION: No known malignancy, now with large (approximately 4.5 x 3.0 cm) spiculated mass within the superior segment of the  right lower lobe. The page underwent attempted bronchoscopic biopsy, however the biopsy could not be performed secondary to active bleeding into the airway. As such, the patient was intubated for airway protection purposes.  The patient was observed uneventfully overnight in the ICU, though repeat bronchoscopy performed this morning demonstrated persistent bleeding from this infiltrative right lower lobe mass.  As such, request made for bronchial artery embolization in hopes of avoiding potential life threatening hemoptysis.  EXAM: 1. ULTRASOUND GUIDANCE FOR ARTERIAL ACCESS 2. SELECTIVE RIGHT BRONCHIAL ARTERIOGRAM (THIRD ORDER) AND FLUOROSCOPIC GUIDED PERCUTANEOUS PARTICLE EMBOLIZATION.  COMPARISON:  Chest CTA - 08/07/2014  MEDICATIONS: Fentanyl 100 mcg IV; Versed at 3 mg IV  Sedation time: 30 minutes  CONTRAST:  21mL OMNIPAQUE IOHEXOL 300 MG/ML  SOLN  FLUOROSCOPY TIME:  6 minutes 36 seconds (416 mGy)  COMPLICATIONS: None immediate  TECHNIQUE: Informed written consent was obtained from the patient's family after a discussion of the risks, benefits and alternatives to treatment. Questions regarding the procedure were encouraged and answered. A timeout was performed prior to the initiation of the procedure.  The right groin was prepped and draped in the usual sterile fashion, and a sterile drape was applied covering the operative field. Maximum barrier sterile technique with sterile gowns and gloves were used for the procedure. A timeout was performed prior to the initiation of the procedure.  The right femoral head was marked fluoroscopically. Under ultrasound guidance, the right common femoral artery was accessed with a micropuncture kit after the overlying soft tissues were anesthetized with 1% lidocaine. An ultrasound image was saved for documentation purposes. The micropuncture sheath was exchanged for a 5 [place] vascular sheath over a Bentson wire. A closure arteriogram was performed through the side of the  sheath confirming access within the right common femoral artery.  Over a Bentson wire, a Mickelson catheter was advanced to the level of the thoracic aorta where it was back bled and flushed. The catheter was then utilized to select the hypertrophied right bronchial artery. A selective right bronchial arteriogram was performed.  With the use of a 0.014 fathom wire, a high-flow Renegade micro catheter was advanced into the distal aspect of the right bronchial artery supplying the superior segment of the right lower lobe. Repeat sub selective arteriogram was performed.  The distal aspect of the inferior segment of the right bronchial artery was then subsequently percutaneously particle embolized with approximately 3/4 of 1 vial of 300-500 micron Embospheres. The micro catheter was retracted into the caudal aspect of the right bronchial artery and a post embolization arteriogram was performed.  Images reviewed and the procedure was terminated. All wires catheters and sheaths were removed from the patient. Hemostasis was achieved at the right groin access site with deployment of an Exoseal closure device. A dressing was placed. The patient tolerated the procedure well without immediate postprocedural complication.  FINDINGS: Selective right bronchial arteriogram demonstrates marked hypertrophy of this vessel as demonstrated on preceding chest CTA.  The caudal tributaries of the right bronchial artery were noted to supply an ill-defined area of hyperemia within the superior medial aspect of the right lower lobe correlating with the spiculated mass seen on preceding chest CT. Following percutaneous selective particle embolization, there is near absence of flow into these  abnormal distal vessels supplying the known spiculated mass.  IMPRESSION: Technically successful right bronchial artery percutaneous particle embolization for active bleeding from the known infiltrative mass within the superior segment of the right lower  lobe.   Electronically Signed   By: Sandi Mariscal M.D.   On: 08/10/2014 18:18   Ir US Guide Vasc Access Right  08/10/2014   INDICATION: No known malignancy, now with large (approximately 4.5 x 3.0 cm) spiculated mass within the superior segment of the right lower lobe. The page underwent attempted bronchoscopic biopsy, however the biopsy could not be performed secondary to active bleeding into the airway. As such, the patient was intubated for airway protection purposes.  The patient was observed uneventfully overnight in the ICU, though repeat bronchoscopy performed this morning demonstrated persistent bleeding from this infiltrative right lower lobe mass.  As such, request made for bronchial artery embolization in hopes of avoiding potential life threatening hemoptysis.  EXAM: 1. ULTRASOUND GUIDANCE FOR ARTERIAL ACCESS 2. SELECTIVE RIGHT BRONCHIAL ARTERIOGRAM (THIRD ORDER) AND FLUOROSCOPIC GUIDED PERCUTANEOUS PARTICLE EMBOLIZATION.  COMPARISON:  Chest CTA - 08/07/2014  MEDICATIONS: Fentanyl 100 mcg IV; Versed at 3 mg IV  Sedation time: 30 minutes  CONTRAST:  7mL OMNIPAQUE IOHEXOL 300 MG/ML  SOLN  FLUOROSCOPY TIME:  6 minutes 36 seconds (151 mGy)  COMPLICATIONS: None immediate  TECHNIQUE: Informed written consent was obtained from the patient's family after a discussion of the risks, benefits and alternatives to treatment. Questions regarding the procedure were encouraged and answered. A timeout was performed prior to the initiation of the procedure.  The right groin was prepped and draped in the usual sterile fashion, and a sterile drape was applied covering the operative field. Maximum barrier sterile technique with sterile gowns and gloves were used for the procedure. A timeout was performed prior to the initiation of the procedure.  The right femoral head was marked fluoroscopically. Under ultrasound guidance, the right common femoral artery was accessed with a micropuncture kit after the overlying soft  tissues were anesthetized with 1% lidocaine. An ultrasound image was saved for documentation purposes. The micropuncture sheath was exchanged for a 5 [place] vascular sheath over a Bentson wire. A closure arteriogram was performed through the side of the sheath confirming access within the right common femoral artery.  Over a Bentson wire, a Mickelson catheter was advanced to the level of the thoracic aorta where it was back bled and flushed. The catheter was then utilized to select the hypertrophied right bronchial artery. A selective right bronchial arteriogram was performed.  With the use of a 0.014 fathom wire, a high-flow Renegade micro catheter was advanced into the distal aspect of the right bronchial artery supplying the superior segment of the right lower lobe. Repeat sub selective arteriogram was performed.  The distal aspect of the inferior segment of the right bronchial artery was then subsequently percutaneously particle embolized with approximately 3/4 of 1 vial of 300-500 micron Embospheres. The micro catheter was retracted into the caudal aspect of the right bronchial artery and a post embolization arteriogram was performed.  Images reviewed and the procedure was terminated. All wires catheters and sheaths were removed from the patient. Hemostasis was achieved at the right groin access site with deployment of an Exoseal closure device. A dressing was placed. The patient tolerated the procedure well without immediate postprocedural complication.  FINDINGS: Selective right bronchial arteriogram demonstrates marked hypertrophy of this vessel as demonstrated on preceding chest CTA.  The caudal tributaries of the right bronchial artery  were noted to supply an ill-defined area of hyperemia within the superior medial aspect of the right lower lobe correlating with the spiculated mass seen on preceding chest CT. Following percutaneous selective particle embolization, there is near absence of flow into these  abnormal distal vessels supplying the known spiculated mass.  IMPRESSION: Technically successful right bronchial artery percutaneous particle embolization for active bleeding from the known infiltrative mass within the superior segment of the right lower lobe.   Electronically Signed   By: Sandi Mariscal M.D.   On: 08/10/2014 18:18   Dg Chest Port 1 View  08/11/2014   CLINICAL DATA:  History of thumb opt this cysts  EXAM: PORTABLE CHEST - 1 VIEW  COMPARISON:  Portable chest x-ray of 08/10/2014 and CT chest of 08/07/2014  FINDINGS: The endotracheal tube has been removed. There is persistent opacity overlying the right hilum and infrahilar region consistent with the patient's known carcinoma. Mild left basilar linear atelectasis is present. Heart size is stable.  IMPRESSION: Endotracheal tube removed. No change in right hilar -infrahilar opacity consistent with mass by CT.   Electronically Signed   By: Ivar Drape M.D.   On: 08/11/2014 08:02   Dg Chest Port 1 View  08/10/2014   CLINICAL DATA:  Hemoptysis  EXAM: PORTABLE CHEST - 1 VIEW  COMPARISON:  08/09/2014  FINDINGS: An endotracheal tube and nasogastric catheter are noted in satisfactory position. The cardiac shadow is within normal limits. The lungs are well aerated bilaterally. Right perihilar mass lesion is again identified similar to that noted on previous chest x-ray and CT. No pneumothorax is identified.  IMPRESSION: Stable right perihilar mass lesion.  Tubes and lines as described.   Electronically Signed   By: Inez Catalina M.D.   On: 08/10/2014 08:13   Dg Chest Port 1 View  08/09/2014   CLINICAL DATA:  Intubation, status post bronchoscopy  EXAM: PORTABLE CHEST - 1 VIEW  COMPARISON:  08/06/2014  FINDINGS: Right hilar/right lower lobe mass reidentified. Endotracheal tube is appropriately positioned. Heart size upper limits of normal. No pneumothorax. No pleural effusion. Curvilinear left lower lobe atelectasis or scarring.  IMPRESSION: No pneumothorax  after bronchoscopy.   Electronically Signed   By: Conchita Paris M.D.   On: 08/09/2014 16:27   [place] [employer]  08/10/2014   INDICATION: No known malignancy, now with large (approximately 4.5 x 3.0 cm) spiculated mass within the superior segment of the right lower lobe. The page underwent attempted bronchoscopic biopsy, however the biopsy could not be performed secondary to active bleeding into the airway. As such, the patient was intubated for airway protection purposes.  The patient was observed uneventfully overnight in the ICU, though repeat bronchoscopy performed this morning demonstrated persistent bleeding from this infiltrative right lower lobe mass.  As such, request made for bronchial artery embolization in hopes of avoiding potential life threatening hemoptysis.  EXAM: 1. ULTRASOUND GUIDANCE FOR ARTERIAL ACCESS 2. SELECTIVE RIGHT BRONCHIAL ARTERIOGRAM (THIRD ORDER) AND FLUOROSCOPIC GUIDED PERCUTANEOUS PARTICLE EMBOLIZATION.  COMPARISON:  Chest CTA - 08/07/2014  MEDICATIONS: Fentanyl 100 mcg IV; Versed at 3 mg IV  Sedation time: 30 minutes  CONTRAST:  23mL OMNIPAQUE IOHEXOL 300 MG/ML  SOLN  FLUOROSCOPY TIME:  6 minutes 36 seconds (098 mGy)  COMPLICATIONS: None immediate  TECHNIQUE: Informed written consent was obtained from the patient's family after a discussion of the risks, benefits and alternatives to treatment. Questions regarding the procedure were encouraged and answered.  A timeout was performed prior to the initiation of the procedure.  The right groin was prepped and draped in the usual sterile fashion, and a sterile drape was applied covering the operative field. Maximum barrier sterile technique with sterile gowns and gloves were used for the procedure. A timeout was performed prior to the initiation of the procedure.  The right femoral head was marked fluoroscopically. Under ultrasound guidance, the right common femoral artery was accessed with a  micropuncture kit after the overlying soft tissues were anesthetized with 1% lidocaine. An ultrasound image was saved for documentation purposes. The micropuncture sheath was exchanged for a 5 [place] vascular sheath over a Bentson wire. A closure arteriogram was performed through the side of the sheath confirming access within the right common femoral artery.  Over a Bentson wire, a Mickelson catheter was advanced to the level of the thoracic aorta where it was back bled and flushed. The catheter was then utilized to select the hypertrophied right bronchial artery. A selective right bronchial arteriogram was performed.  With the use of a 0.014 fathom wire, a high-flow Renegade micro catheter was advanced into the distal aspect of the right bronchial artery supplying the superior segment of the right lower lobe. Repeat sub selective arteriogram was performed.  The distal aspect of the inferior segment of the right bronchial artery was then subsequently percutaneously particle embolized with approximately 3/4 of 1 vial of 300-500 micron Embospheres. The micro catheter was retracted into the caudal aspect of the right bronchial artery and a post embolization arteriogram was performed.  Images reviewed and the procedure was terminated. All wires catheters and sheaths were removed from the patient. Hemostasis was achieved at the right groin access site with deployment of an Exoseal closure device. A dressing was placed. The patient tolerated the procedure well without immediate postprocedural complication.  FINDINGS: Selective right bronchial arteriogram demonstrates marked hypertrophy of this vessel as demonstrated on preceding chest CTA.  The caudal tributaries of the right bronchial artery were noted to supply an ill-defined area of hyperemia within the superior medial aspect of the right lower lobe correlating with the spiculated mass seen on preceding chest CT. Following percutaneous selective particle embolization,  there is near absence of flow into these abnormal distal vessels supplying the known spiculated mass.  IMPRESSION: Technically successful right bronchial artery percutaneous particle embolization for active bleeding from the known infiltrative mass within the superior segment of the right lower lobe.   Electronically Signed   By: Sandi Mariscal M.D.   On: 08/10/2014 18:18     CBC  Recent Labs Lab 08/11/14 0218 08/12/14 0537 08/13/14 0551 08/14/14 0518 08/15/14 0713 08/16/14 0611  WBC 13.2* 18.7* 19.3* 14.4* 12.8* 12.6*  HGB 10.9* 11.6* 10.9* 10.7* 11.3* 10.9*  HCT 32.0* 34.1* 32.1* 32.0* 33.3* 33.3*  PLT 393 399 432* 422* 444* 505*  MCV 92.2 90.9 90.9 90.1 90.2 90.2  MCH 31.4 30.9 30.9 30.1 30.6 29.5  MCHC 34.1 34.0 34.0 33.4 33.9 32.7  RDW 12.9 13.1 13.1 13.0 13.0 13.0  LYMPHSABS 1.5  --   --   --   --   --   MONOABS 1.0  --   --   --   --   --   EOSABS 0.0  --   --   --   --   --   BASOSABS 0.0  --   --   --   --   --     Chemistries   Recent  Labs Lab 08/10/14 0254 08/11/14 0218 08/12/14 0537 08/13/14 0551 08/14/14 0518 08/16/14 0611  NA 135 139 136  --  135 136  K 3.8 3.3* 3.2* 3.2* 3.6 3.5  CL 103 108 101  --  100* 102  CO2 $Re'23 23 27  'frc$ --  26 26  GLUCOSE 93 101* 108*  --  120* 100*  BUN <5* <5* <5*  --  <5* <5*  CREATININE 0.46 0.44 0.42*  --  0.38* 0.52  CALCIUM 8.3* 8.2* 8.2*  --  8.4* 8.6*  MG  --   --   --  1.8 2.1  --    ------------------------------------------------------------------------------------------------------------------ estimated creatinine clearance is 62.5 mL/min (by C-G formula based on Cr of 0.52). ------------------------------------------------------------------------------------------------------------------ No results for input(s): HGBA1C in the last 72 hours. ------------------------------------------------------------------------------------------------------------------ No results for input(s): CHOL, HDL, LDLCALC, TRIG, CHOLHDL,  LDLDIRECT in the last 72 hours. ------------------------------------------------------------------------------------------------------------------ No results for input(s): TSH, T4TOTAL, T3FREE, THYROIDAB in the last 72 hours.  Invalid input(s): FREET3 ------------------------------------------------------------------------------------------------------------------ No results for input(s): VITAMINB12, FOLATE, FERRITIN, TIBC, IRON, RETICCTPCT in the last 72 hours.  Coagulation profile  Recent Labs Lab 08/10/14 1150  INR 1.17    No results for input(s): DDIMER in the last 72 hours.  Cardiac Enzymes No results for input(s): CKMB, TROPONINI, MYOGLOBIN in the last 168 hours.  Invalid input(s): CK ------------------------------------------------------------------------------------------------------------------ Invalid input(s): POCBNP   Time Spent in minutes  35   SINGH,PRASHANT K M.D on 08/16/2014 at 9:28 AM  Between 7am to 7pm - Pager - [phone]  After 7pm go to [email or web address] - password [facility]  [facility] -  Office  [phone]

## 2014-08-16 NOTE — Progress Notes (Signed)
ANTIBIOTIC CONSULT NOTE  Pharmacy Consult:  Vancomycin / Zosyn Indication:  PNA  No Known Allergies  Patient Measurements: Height: '4\' 11"'$  (149.9 cm) Weight: 114 lb 3.2 oz (51.8 kg) IBW/kg (Calculated) : 43.2  Vital Signs: Temp: 99.1 F (37.3 C) (08/01 0643) Temp Source: Oral (08/01 0643) BP: 98/55 mmHg (08/01 0643) Pulse Rate: 88 (08/01 0643) Intake/Output from previous day: 07/31 0701 - 08/01 0700 In: 640 [P.O.:440; IV Piggyback:200] Out: 1600 [Urine:1600]  Labs:  Recent Labs  08/14/14 0518 08/15/14 0713 08/16/14 0611  WBC 14.4* 12.8* 12.6*  HGB 10.7* 11.3* 10.9*  PLT 422* 444* 505*  CREATININE 0.38*  --  0.52   Estimated Creatinine Clearance: 62.5 mL/min (by C-G formula based on Cr of 0.52). No results for input(s): VANCOTROUGH, VANCOPEAK, VANCORANDOM, GENTTROUGH, GENTPEAK, GENTRANDOM, TOBRATROUGH, TOBRAPEAK, TOBRARND, AMIKACINPEAK, AMIKACINTROU, AMIKACIN in the last 72 hours.   Microbiology: Recent Results (from the past 720 hour(s))  Culture, blood (routine x 2)     Status: None   Collection Time: 08/07/14  5:10 PM  Result Value Ref Range Status   Specimen Description BLOOD RIGHT HAND  Final   Special Requests BOTTLES DRAWN AEROBIC AND ANAEROBIC 10CC  Final   Culture NO GROWTH 5 DAYS  Final   Report Status 08/12/2014 FINAL  Final  Culture, blood (routine x 2)     Status: None   Collection Time: 08/07/14  5:13 PM  Result Value Ref Range Status   Specimen Description BLOOD LEFT WRIST  Final   Special Requests BOTTLES DRAWN AEROBIC AND ANAEROBIC 10CC  Final   Culture NO GROWTH 5 DAYS  Final   Report Status 08/12/2014 FINAL  Final  AFB culture with smear     Status: None (Preliminary result)   Collection Time: 08/09/14  3:30 PM  Result Value Ref Range Status   Specimen Description BRONCHIAL WASHINGS  Final   Special Requests Normal  Final   Acid Fast Smear   Final    NO ACID FAST BACILLI SEEN Performed at [facility]    Culture   Final     CULTURE WILL BE EXAMINED FOR 6 WEEKS BEFORE ISSUING A FINAL REPORT Performed at [facility]    Report Status PENDING  Incomplete  Fungus Culture with Smear     Status: None (Preliminary result)   Collection Time: 08/09/14  3:30 PM  Result Value Ref Range Status   Specimen Description BRONCHIAL WASHINGS  Final   Special Requests Normal  Final   Fungal Smear   Final    NO YEAST OR FUNGAL ELEMENTS SEEN Performed at [facility]    Culture   Final    CULTURE IN PROGRESS FOR FOUR WEEKS Performed at [facility]    Report Status PENDING  Incomplete  MRSA PCR Screening     Status: None   Collection Time: 08/09/14  7:30 PM  Result Value Ref Range Status   MRSA by PCR NEGATIVE NEGATIVE Final    Comment:        The GeneXpert MRSA Assay (FDA approved for NASAL specimens only), is one component of a comprehensive MRSA colonization surveillance program. It is not intended to diagnose MRSA infection nor to guide or monitor treatment for MRSA infections.   Urine culture     Status: None   Collection Time: 08/13/14 10:58 AM  Result Value Ref Range Status   Specimen Description URINE, RANDOM  Final   Special Requests NONE  Final   Culture 20,000 COLONIES/mL  YEAST  Final   Report Status 08/14/2014 FINAL  Final  Culture, expectorated sputum-assessment     Status: None   Collection Time: 08/13/14  1:54 PM  Result Value Ref Range Status   Specimen Description SPUTUM  Final   Special Requests NONE  Final   Sputum evaluation   Final    THIS SPECIMEN IS ACCEPTABLE. RESPIRATORY CULTURE REPORT TO FOLLOW.   Report Status 08/13/2014 FINAL  Final  Culture, respiratory (NON-Expectorated)     Status: None   Collection Time: 08/13/14  1:54 PM  Result Value Ref Range Status   Specimen Description SPUTUM  Final   Special Requests NONE  Final   Gram Stain   Final    RARE WBC PRESENT, PREDOMINANTLY MONONUCLEAR RARE SQUAMOUS EPITHELIAL CELLS PRESENT FEW GRAM POSITIVE  COCCI IN PAIRS IN CHAINS IN CLUSTERS FEW GRAM NEGATIVE RODS Performed at [facility]    Culture   Final    NORMAL OROPHARYNGEAL FLORA Performed at [facility]    Report Status 08/15/2014 FINAL  Final    Assessment: 33 YOF presented on 08/07/14 with hemoptysis and was started on Unasyn for possible PNA.  Imaging revealed RLL mass and adrenal/bony metastatic disease.  Pharmacy consulted to transition patient to vancomycin and Zosyn to cover for HCAP in setting of worsening leukocytosis and fever.   Vanc 7/29 >> Zosyn 7/29 >> Unasyn 7/24 >> 7/28  7/23 BCx x2 - negative 7/25 bronchial washing AFB cx - pending 7/25 bronchial washing fungus cx - pending 7/29 UCx - 20K yeast 7/29 sputum: normal flora  SCr increased from 0.3>0.54. CrCl still 60-74m/min and UOP is >0.569mkg/min.  WBC improving, but still up at 12.6. Afeb. Oncology consulted, along with IR for biopsy of left adrenal mass  Goal of Therapy:  Vancomycin trough level 15-20 mcg/ml  Plan:  - Vanc '750mg'$  IV Q12H - Zosyn 3.375gm IV Q8H, 4 hr infusion - Monitor renal fxn, clinical progress, vanc trough as indicated- if patient is to continue on therapy tomorrow, will obtain trough prior to afternoon dose  Angelisse Riso D. Ronalee Scheunemann, PharmD, BCPS Clinical Pharmacist Pager: [phone]/01/2014 2:16 PM

## 2014-08-16 NOTE — Procedures (Signed)
Interventional Radiology Procedure Note  Procedure:  CT guided core biopsy of left acetablular/pelvic bone mass  Complications:  None  Estimated Blood Loss: <10 mL  Lesion of left acetabular region localized.  Safer to sample than adrenal mass. 18 G core biopsy x 4 via 17 G needle.  No complications.  Venetia Night. Kathlene Cote, M.D Pager:  [phone]

## 2014-08-17 ENCOUNTER — Encounter (HOSPITAL_COMMUNITY): Payer: Self-pay | Admitting: Primary Care

## 2014-08-17 DIAGNOSIS — Z515 Encounter for palliative care: Secondary | ICD-10-CM | POA: Insufficient documentation

## 2014-08-17 DIAGNOSIS — C7931 Secondary malignant neoplasm of brain: Secondary | ICD-10-CM

## 2014-08-17 LAB — BASIC METABOLIC PANEL
Anion gap: 13 (ref 5–15)
BUN: <5 mg/dL — ABNORMAL LOW (ref 6–20)
CO2: 26 mmol/L (ref 22–32)
Calcium: 8.4 mg/dL — ABNORMAL LOW (ref 8.9–10.3)
Chloride: 96 mmol/L — ABNORMAL LOW (ref 101–111)
Creatinine, Ser: 0.41 mg/dL — ABNORMAL LOW (ref 0.44–1.00)
GFR calc Af Amer: >60 mL/min (ref >60)
GLUCOSE: 98 mg/dL (ref 65–99)
POTASSIUM: 3.2 mmol/L — AB (ref 3.5–5.1)
SODIUM: 135 mmol/L (ref 135–145)

## 2014-08-17 LAB — CBC
HCT: 32.8 % — ABNORMAL LOW (ref 36.0–46.0)
HEMOGLOBIN: 10.9 g/dL — AB (ref 12.0–15.0)
MCH: 30 pg (ref 26.0–34.0)
MCHC: 33.2 g/dL (ref 30.0–36.0)
MCV: 90.4 fL (ref 78.0–100.0)
PLATELETS: 492 10*3/uL — AB (ref 150–400)
RBC: 3.63 MIL/uL — AB (ref 3.87–5.11)
RDW: 13 % (ref 11.5–15.5)
WBC: 12.9 10*3/uL — ABNORMAL HIGH (ref 4.0–10.5)

## 2014-08-17 MED ORDER — DEXAMETHASONE SODIUM PHOSPHATE 4 MG/ML IJ SOLN: 4.0000 mg | Freq: Two times a day (BID) | INTRAMUSCULAR | Status: DC

## 2014-08-17 MED ORDER — METRONIDAZOLE 500 MG PO TABS: 500.0000 mg | ORAL_TABLET | Freq: Three times a day (TID) | ORAL | Status: DC

## 2014-08-17 MED ORDER — DEXAMETHASONE SODIUM PHOSPHATE 10 MG/ML IJ SOLN
10.0000 mg | Freq: Once | INTRAMUSCULAR | Status: AC
  Administered 2014-08-17: 10 mg via INTRAVENOUS
  Filled 2014-08-17: qty 1

## 2014-08-17 MED ORDER — METRONIDAZOLE 500 MG PO TABS
500.0000 mg | ORAL_TABLET | Freq: Three times a day (TID) | ORAL | Status: DC
  Administered 2014-08-17: 500 mg via ORAL
  Filled 2014-08-17: qty 1

## 2014-08-17 MED ORDER — LEVOFLOXACIN 750 MG PO TABS
750.0000 mg | ORAL_TABLET | Freq: Every day | ORAL | Status: DC
  Administered 2014-08-17: 750 mg via ORAL
  Filled 2014-08-17 (×2): qty 1

## 2014-08-17 MED ORDER — DEXAMETHASONE SODIUM PHOSPHATE 4 MG/ML IJ SOLN
4.0000 mg | Freq: Four times a day (QID) | INTRAMUSCULAR | Status: DC
  Administered 2014-08-17: 4 mg via INTRAVENOUS
  Filled 2014-08-17: qty 1

## 2014-08-17 MED ORDER — ONDANSETRON HCL 4 MG PO TABS: 4.0000 mg | ORAL_TABLET | Freq: Three times a day (TID) | ORAL | Status: DC | PRN

## 2014-08-17 MED ORDER — LEVOFLOXACIN 750 MG PO TABS: 750.0000 mg | ORAL_TABLET | Freq: Every day | ORAL | Status: DC

## 2014-08-17 MED ORDER — HYDROCODONE-ACETAMINOPHEN 10-325 MG PO TABS: 1.0000 | ORAL_TABLET | Freq: Four times a day (QID) | ORAL | Status: DC | PRN

## 2014-08-17 MED ORDER — DEXAMETHASONE 4 MG PO TABS: 4.0000 mg | ORAL_TABLET | Freq: Two times a day (BID) | ORAL | Status: DC

## 2014-08-17 MED ORDER — ALBUTEROL SULFATE HFA 108 (90 BASE) MCG/ACT IN AERS: 2.0000 | INHALATION_SPRAY | Freq: Four times a day (QID) | RESPIRATORY_TRACT | Status: DC | PRN

## 2014-08-17 NOTE — Discharge Summary (Signed)
Deborah Foster, is a 44 y.o. female  DOB 28-Aug-1970  MRN [id number].  Admission date:  08/07/2014  Admitting Physician  Oswald Hillock, MD  Discharge Date:  08/17/2014   Primary MD  [name], MD  Recommendations for primary care physician for things to follow:   Monitor CBC, BMP closely   Admission Diagnosis  Hemoptysis [R04.2]   Discharge Diagnosis  Hemoptysis [R04.2]     Active Problems:   CAP (community acquired pneumonia)   Pneumonia   Lung mass   Hemoptysis   Adrenal mass, left      Past Medical History  Diagnosis Date  . Cancer     Past Surgical History  Procedure Laterality Date  . Video bronchoscopy Bilateral 08/09/2014    Procedure: VIDEO BRONCHOSCOPY WITHOUT FLUORO;  Surgeon: [name], MD;  Location: [facility];  Service: Cardiopulmonary;  Laterality: Bilateral;       HPI  from the history and physical done on the day of admission:    Extremely pleasant 44 year old obese female who had no previous issues came to the hospital one week ago with cough and mild hemoptysis, workup showed large left-sided lung mass with suspicious and likely metastatic lesions to T-spine, left hip, adrenal gland, and brain. She was initially seen by pulmonary critical care and taken for bronchoscopy however she had excessive bleeding during bronchoscopy and she was intubated. She was subsequently stabilized but developed HCAP, this was treated with appropriate anti-biotics. Infection has clinically defervesced. She was then seen by IR and underwent left hip biopsy. Biopsy results are pending.   She has been seen by oncologist Dr. Julien Nordmann, palliative care team, I have discussed her case with radiation oncologist Dr. Isidore Moos personally who agrees that patient can be discharged and she will ensure a  prompt follow-up in our of this. Patient today is symptom-free and eager to go home ASAP.     Hospital Course:      1. RLL Lung mass - suspicious for bronchogenic carcinoma met to bone and adrenal, and multiple lesions in the brain. PCCM saw the patient, she was taken for bronchoscopy on 08/09/2014, she had evidence of massive bleed during bronchoscopy and had to be intubated to protect airway, she subsequently required bronchial artery embolization by IR to stop the bleeding.   She was extubated on 08/11/2014. No biopsies were done due to ongoing bleeding during bronchoscopy. CT abdomen and pelvis showed left adrenal mass and hip lesion, she underwent left hip biopsy by IR on 08/16/2014 with pending absolute results. Chronically she looks highly suspicious for primary bronchogenic carcinoma with metastases to the brain, T spine, left hip and adrenal gland.  So far clinically this looks like primary bronchogenic carcinoma with metastasis to brain, left adrenal gland, thoracic vertebra, left hip along with possibly kidney. She will be discharged home with close outpatient follow-up with oncologist Dr. Julien Nordmann and radiation oncologist Dr. Isidore Moos.   2. Possible postobstructive pneumonia - HCAP . She was treated with appropriate IV anti-biotics for HCAP with  good results, she is completely afebrile white count is much improved she will be switched to oral Levaquin and Flagyl and discharged home. Request PCP to repeat CBC CMP and a 2 view chest x-ray next visit.    3. Hypokalemia. Replaced. Stable.   4. Some new onset mid back pain. Likely pain from T spine metastasis. Lortab added.   5. Metastases to the brain. MRI discussed with neurosurgeon Dr. Sherwood Gambler and radiation oncologist Dr. Isidore Moos, patient is completely symptom free, she was given IV Decadron 10 mg here and then she will be placed on 4 mg of oral Decadron twice a day. She will follow with Dr. Isidore Moos closely in the office Dr. Isidore Moos  will ensure a prompt follow-up appointment. I discussed the case with both the physicians personally.     Discharge Condition: poor  Follow UP  Follow-up Information    Follow up with [name], MD On 08/24/2014.   Specialty:  Family Medicine   Why:  Appointment with Dr. Arelia Sneddon is on 08/24/14 at 9:45am   Contact information:   [place] [address] [phone]       Follow up with Eilleen Kempf., MD. Schedule an appointment as soon as possible for a visit in 1 week.   Specialty:  Oncology   Contact information:   [address] [place] [address] [phone]       Follow up with Eppie Gibson, MD. Schedule an appointment as soon as possible for a visit in 1 week.   Specialty:  Radiation Oncology   Contact information:   [address] [place] [address] [phone]        Consults obtained -  PCCM, IR, Oncology, Rad Onc, Pall care  Diet and Activity recommendation: See Discharge Instructions below  Discharge Instructions       Discharge Instructions    Diet - low sodium heart healthy    Complete by:  As directed      Discharge instructions    Complete by:  As directed   Follow with Primary MD [name], MD in 7 days   Get CBC, CMP, 2 view Chest X ray checked  by Primary MD next visit.    Activity: As tolerated with Full fall precautions use walker/cane & assistance as needed   Disposition Home     Diet: Heart Healthy .  For Heart failure patients - Check your Weight same time everyday, if you gain over 2 pounds, or you develop in leg swelling, experience more shortness of breath or chest pain, call your Primary MD immediately. Follow Cardiac Low Salt Diet and 1.5 lit/day fluid restriction.   On your next visit with your primary care physician please Get Medicines reviewed and adjusted.   Please request your Prim.MD to go over all Hospital Tests and Procedure/Radiological results at the follow up,  please get all Hospital records sent to your Prim MD by signing hospital release before you go home.   If you experience worsening of your admission symptoms, develop shortness of breath, life threatening emergency, suicidal or homicidal thoughts you must seek medical attention immediately by calling 911 or calling your MD immediately  if symptoms less severe.  You Must read complete instructions/literature along with all the possible adverse reactions/side effects for all the Medicines you take and that have been prescribed to you. Take any new Medicines after you have completely understood and accpet all the possible adverse reactions/side effects.   Do not drive, operating  heavy machinery, perform activities at heights, swimming or participation in water activities or provide baby sitting services if your were admitted for syncope or siezures until you have seen by Primary MD or a Neurologist and advised to do so again.  Do not drive when taking Pain medications.    Do not take more than prescribed Pain, Sleep and Anxiety Medications  Special Instructions: If you have smoked or chewed Tobacco  in the last 2 yrs please stop smoking, stop any regular Alcohol  and or any Recreational drug use.  Wear Seat belts while driving.   Please note  You were cared for by a hospitalist during your hospital stay. If you have any questions about your discharge medications or the care you received while you were in the hospital after you are discharged, you can call the unit and asked to speak with the hospitalist on call if the hospitalist that took care of you is not available. Once you are discharged, your primary care physician will handle any further medical issues. Please note that NO REFILLS for any discharge medications will be authorized once you are discharged, as it is imperative that you return to your primary care physician (or establish a relationship with a primary care physician if you do not  have one) for your aftercare needs so that they can reassess your need for medications and monitor your lab values.     Increase activity slowly    Complete by:  As directed              Discharge Medications       Medication List    TAKE these medications        albuterol 108 (90 BASE) MCG/ACT inhaler  Commonly known as:  PROVENTIL HFA;VENTOLIN HFA  Inhale 2 puffs into the lungs every 6 (six) hours as needed for wheezing or shortness of breath.     dexamethasone 4 MG tablet  Commonly known as:  DECADRON  Take 1 tablet (4 mg total) by mouth 2 (two) times daily.     HYDROcodone-acetaminophen 10-325 MG per tablet  Commonly known as:  NORCO  Take 1 tablet by mouth every 6 (six) hours as needed for moderate pain or severe pain.     levofloxacin 750 MG tablet  Commonly known as:  LEVAQUIN  Take 1 tablet (750 mg total) by mouth daily.     metroNIDAZOLE 500 MG tablet  Commonly known as:  FLAGYL  Take 1 tablet (500 mg total) by mouth every 8 (eight) hours.     ondansetron 4 MG tablet  Commonly known as:  ZOFRAN  Take 1 tablet (4 mg total) by mouth every 8 (eight) hours as needed for nausea or vomiting.        Major procedures and Radiology Reports - PLEASE review detailed and final reports for all details, in brief -       Dg Chest 2 View  08/13/2014   CLINICAL DATA:  44 year old female with shortness of breath and hemoptysis for almost 1 week. Known right lower lobe mass. Status post recent right bronchial artery embolization on 08/10/2014.  EXAM: CHEST  2 VIEW  COMPARISON:  Recent prior chest x-ray 08/11/2014  FINDINGS: Masslike opacity in the right lower lobe again demonstrated without significant interval change. Ill-defined airspace opacity in the periphery of the lesion likely consistent with a combination of postobstructive changes and possibly alveolar hemorrhage. The left lung remains relatively clear. The cardiac and mediastinal contours are unchanged. No  acute  osseous abnormality. Overall, very similar appearance of the chest. No new pneumothorax or pleural effusion.  IMPRESSION: Similar appearance of known right lower lobe pulmonary mass and surrounding airspace opacity likely reflecting a combination of postobstructive change and alveolar hemorrhage.  No significant interval change.   Electronically Signed   By: Jacqulynn Cadet M.D.   On: 08/13/2014 08:04   Dg Chest 2 View  08/06/2014   CLINICAL DATA:  Hemoptysis and chest pain  EXAM: CHEST  2 VIEW  COMPARISON:  None.  FINDINGS: There is focal airspace consolidation in the right middle lobe. Lungs elsewhere clear. Heart size and pulmonary vascularity are normal. No adenopathy. No bone lesions.  IMPRESSION: Right middle lobe pneumonia. Lungs otherwise clear. Followup PA and lateral chest radiographs recommended in 3-4 weeks following trial of antibiotic therapy to ensure resolution and exclude underlying malignancy.  These results will be called to the ordering clinician or representative by the Radiologist Assistant, and communication documented in the PACS or zVision Dashboard.   Electronically Signed   By: Lowella Grip III M.D.   On: 08/06/2014 17:22   Ct Angio Chest Pe W/cm &/or Wo Cm  08/07/2014   CLINICAL DATA:  Right middle lobe pneumonia on chest x-ray, recent hemoptysis  EXAM: CT ANGIOGRAPHY CHEST WITH CONTRAST  TECHNIQUE: Multidetector CT imaging of the chest was performed using the standard protocol during bolus administration of intravenous contrast. Multiplanar CT image reconstructions and MIPs were obtained to evaluate the vascular anatomy.  CONTRAST:  13m OMNIPAQUE IOHEXOL 350 MG/ML SOLN  COMPARISON:  Chest x-ray from the previous day  FINDINGS: Left lung is well aerated without evidence focal infiltrate or sizable effusion. The right lung is also well aerated and demonstrates some mild infiltrate within the right middle lobe. In the right lower lobe emanating from the hilum inferiorly there  is a 4.4 by 3.0 by 3.6 cm peripherally spiculated mass lesion consistent with a primary pulmonary neoplasm. Distal to this there are changes of postobstructive infiltrate. There is occlusion of the lower lobe bronchus as well as significant attenuation of the lower lobe pulmonary artery. Filling defect is identified within the bronchus intermedius consistent with local in growth. Right hilar adenopathy measuring 14 mm in short axis is seen.  The thoracic aorta in its branches are within normal limits. Pulmonary artery demonstrates a normal branching pattern without evidence of pulmonary embolism. Narrowing is noted in the right lower lobe branches consistent with the underlying mass lesion.  Scanning into the upper abdomen demonstrates evidence of a 3.8 cm mass in the left adrenal gland consistent with metastatic disease. The bony structures show patchy lucencies in the thoracic vertebral bodies consistent with metastatic disease.  Review of the MIP images confirms the above findings.  IMPRESSION: Right lower lobe mass lesion with postobstructive changes and invasion into the bronchus intermedius as described consistent with a primary pulmonary neoplasm. There are changes consistent with left adrenal metastatic disease as well as bony metastatic disease.  Mild right middle lobe infiltrate.   Electronically Signed   By: MInez CatalinaM.D.   On: 08/07/2014 14:24   Mr BJeri CosWKCContrast  08/16/2014   CLINICAL DATA:  Newly diagnosed metastatic lung cancer.  EXAM: MRI HEAD WITHOUT AND WITH CONTRAST  TECHNIQUE: Multiplanar, multiecho pulse sequences of the brain and surrounding structures were obtained without and with intravenous contrast.  CONTRAST:  12mMULTIHANCE GADOBENATE DIMEGLUMINE 529 MG/ML IV SOLN  COMPARISON:  MRI of the cervical spine January 29, 2014  FINDINGS: At least 3 cerebellar and 10 supratentorial enhancing metastasis seen, many of which show susceptibility artifact compatible with hemorrhage, a few  demonstrate intrinsic T1 shortening consistent with subacute blood products.  Lesions include:  Infratentorial: Less than 5 mm RIGHT, less than 5 mm LEFT, 9 x 7 mm LEFT cerebellum.  Supratentorial: 36 x 24 mm LEFT temporal lobe, 5 x 8 mm RIGHT occipital lobe, 12 x 9 mm RIGHT occipital lobe, 19 x 22 mm LEFT frontal lobe, 17 x 12 mm RIGHT frontal lobe, 12 x 13 mm RIGHT parietal lobe in addition to less than 5 mm RIGHT frontal superior gyrus, RIGHT posterior frontal lobe, LEFT temporal parietal, LEFT temporal lobe lesions.  All lesions demonstrate T2 bright vasogenic edema and local mass effect. 9 mm LEFT-to-RIGHT midline shift, with partially effaced LEFT lateral ventricle, and mild entrapment of LEFT temporal horn. Mild LEFT uncal herniation and effacement of the LEFT quadrigeminal and ambient cistern. No reduced diffusion to suggest acute ischemia.  No abnormal extra-axial fluid collections, extra all axial enhancement or extra-axial masses. Normal major intracranial vascular flow voids seen at the skull base.  Ocular globes and orbital contents unremarkable. Paranasal sinuses and mastoid air cells are well aerated. Subcentimeter probable hemangiomas in the calvarium though fat saturated post gadolinium sequences would be more sensitive.  IMPRESSION: At least 3 cerebellar and 10 supratentorial intraparenchymal metastasis, largest in LEFT temporal lobe measuring 3.6 x 2.4 cm, resulting in local mass effect including uncal herniation and effaced LEFT ambient/quadrigeminal cistern.  9 mm LEFT-to-RIGHT midline shift. Mild/early LEFT temporal horn entrapment.   Electronically Signed   By: Elon Alas M.D.   On: 08/16/2014 23:52   Ct Abdomen Pelvis W Contrast  08/11/2014   CLINICAL DATA:  Lung cancer. Recent diagnosis of right lower lobe mass with left adrenal nodule and bony metastatic disease.  EXAM: CT ABDOMEN AND PELVIS WITH CONTRAST  TECHNIQUE: Multidetector CT imaging of the abdomen and pelvis was performed  using the standard protocol following bolus administration of intravenous contrast.  CONTRAST:  17m OMNIPAQUE IOHEXOL 300 MG/ML  SOLN  COMPARISON:  Chest CT 08/07/2014  FINDINGS: Lower chest: Right lower lobe mass is partially included, there is increased adjacent ground-glass opacity and consolidation, likely related to hemorrhage. No definite right pleural effusion. Minimal atelectasis in the left lower lobe with tiny subpulmonic left pleural effusion.  Hepatobiliary: Intra and extrahepatic biliary ductal dilatation, the common bile duct measures up to 1.7 cm proximally with tapering at the duodenum all insertion. Mild central intra and extrahepatic biliary ductal dilatation. The gallbladder is physiologically distended. Punctate, less than 2 mm hypodensity in the subcapsular right lobe of the liver. There is otherwise no focal hepatic lesion or signs of hepatic metastatic disease.  Pancreas: Normal, no pancreatic ductal dilatation. No focal pancreatic lesion, particularly at the pancreatic head.  Spleen: Normal in size without focal lesion.  Adrenals/Urinary Tract: Heterogeneous left adrenal mass measuring 5.2 x 2.5 cm with central low density component concerning for necrosis. The right adrenal gland appears normal. Kidneys demonstrate symmetric enhancement and excretion. No hydronephrosis. Mm cortical hypodensity in the lower right kidney, too small to characterize.  Stomach/Bowel: Stomach is physiologically distended. There are no dilated or thickened bowel loops. The appendix is normal. Small to moderate volume of colonic stool without colonic wall thickening.  Vascular/Lymphatic: Small retroperitoneal lymph nodes all measuring less than 3 mm in short axis dimension. No pathologic retroperitoneal, mesenteric, or inguinal adenopathy. The abdominal aorta is normal in  caliber.  Reproductive: The uterus is normal for age. There is is 3 cm left ovarian cyst. Physiologic follicles in the right ovary. No adnexal  mass.  Bladder:  Physiologically distended.  Other: No ascites.  Musculoskeletal: Osseous metastatic disease involving the left acetabulum measuring 4.0 x 3.7 cm. There is destruction of the medial cortex with soft tissue component and involvement of the superior articular surface. Cortical thinning laterally. Additional multifocal lytic lesions in the left ischium. Lytic lesion in the left femoral neck without definite cortical thinning. Sclerotic density in the right femoral neck. Additional small lucent lesions throughout the bony pelvis. Questionable small lytic lesions in L1 and L2 vertebral bodies.  IMPRESSION: 1. Left adrenal mass measures 5.2 cm with central low density concerning for necrotic metastasis. 2. Osseous metastatic disease. Large destructive lesion involving the left acetabulum with soft tissue component medial and involvement of the superior articular surface. Additional osseous metastasis in the left femoral neck, throughout the bony pelvis, and likely within L1 and L2 vertebral bodies. 3. Intra and extrahepatic biliary ductal dilatation without definable cause. No gallbladder distention or pancreatic ductal dilatation. No obstructing pancreatic lesion. 4. Tiny 2 mm subcapsular hypodensity in the right lobe of the liver, too small to characterize. Small 4 mm hypodensity in the lower right kidney, also too small to characterize.   Electronically Signed   By: Jeb Levering M.D.   On: 08/11/2014 21:45   Ir Aorta/thoracic  08/10/2014   INDICATION: No known malignancy, now with large (approximately 4.5 x 3.0 cm) spiculated mass within the superior segment of the right lower lobe. The page underwent attempted bronchoscopic biopsy, however the biopsy could not be performed secondary to active bleeding into the airway. As such, the patient was intubated for airway protection purposes.  The patient was observed uneventfully overnight in the ICU, though repeat bronchoscopy performed this morning  demonstrated persistent bleeding from this infiltrative right lower lobe mass.  As such, request made for bronchial artery embolization in hopes of avoiding potential life threatening hemoptysis.  EXAM: 1. ULTRASOUND GUIDANCE FOR ARTERIAL ACCESS 2. SELECTIVE RIGHT BRONCHIAL ARTERIOGRAM (THIRD ORDER) AND FLUOROSCOPIC GUIDED PERCUTANEOUS PARTICLE EMBOLIZATION.  COMPARISON:  Chest CTA - 08/07/2014  MEDICATIONS: Fentanyl 100 mcg IV; Versed at 3 mg IV  Sedation time: 30 minutes  CONTRAST:  43m OMNIPAQUE IOHEXOL 300 MG/ML  SOLN  FLUOROSCOPY TIME:  6 minutes 36 seconds (3785mGy)  COMPLICATIONS: None immediate  TECHNIQUE: Informed written consent was obtained from the patient's family after a discussion of the risks, benefits and alternatives to treatment. Questions regarding the procedure were encouraged and answered. A timeout was performed prior to the initiation of the procedure.  The right groin was prepped and draped in the usual sterile fashion, and a sterile drape was applied covering the operative field. Maximum barrier sterile technique with sterile gowns and gloves were used for the procedure. A timeout was performed prior to the initiation of the procedure.  The right femoral head was marked fluoroscopically. Under ultrasound guidance, the right common femoral artery was accessed with a micropuncture kit after the overlying soft tissues were anesthetized with 1% lidocaine. An ultrasound image was saved for documentation purposes. The micropuncture sheath was exchanged for a 5 [place] sheath over a Bentson wire. A closure arteriogram was performed through the side of the sheath confirming access within the right common femoral artery.  Over a Bentson wire, a Mickelson catheter was advanced to the level of the thoracic aorta where it was back  bled and flushed. The catheter was then utilized to select the hypertrophied right bronchial artery. A selective right bronchial arteriogram was performed.  With the  use of a 0.014 fathom wire, a high-flow Renegade micro catheter was advanced into the distal aspect of the right bronchial artery supplying the superior segment of the right lower lobe. Repeat sub selective arteriogram was performed.  The distal aspect of the inferior segment of the right bronchial artery was then subsequently percutaneously particle embolized with approximately 3/4 of 1 vial of 300-500 micron Embospheres. The micro catheter was retracted into the caudal aspect of the right bronchial artery and a post embolization arteriogram was performed.  Images reviewed and the procedure was terminated. All wires catheters and sheaths were removed from the patient. Hemostasis was achieved at the right groin access site with deployment of an Exoseal closure device. A dressing was placed. The patient tolerated the procedure well without immediate postprocedural complication.  FINDINGS: Selective right bronchial arteriogram demonstrates marked hypertrophy of this vessel as demonstrated on preceding chest CTA.  The caudal tributaries of the right bronchial artery were noted to supply an ill-defined area of hyperemia within the superior medial aspect of the right lower lobe correlating with the spiculated mass seen on preceding chest CT. Following percutaneous selective particle embolization, there is near absence of flow into these abnormal distal vessels supplying the known spiculated mass.  IMPRESSION: Technically successful right bronchial artery percutaneous particle embolization for active bleeding from the known infiltrative mass within the superior segment of the right lower lobe.   Electronically Signed   By: Sandi Mariscal M.D.   On: 08/10/2014 18:18   Ir Angiogram Selective Each Additional Vessel  08/10/2014   INDICATION: No known malignancy, now with large (approximately 4.5 x 3.0 cm) spiculated mass within the superior segment of the right lower lobe. The page underwent attempted bronchoscopic biopsy,  however the biopsy could not be performed secondary to active bleeding into the airway. As such, the patient was intubated for airway protection purposes.  The patient was observed uneventfully overnight in the ICU, though repeat bronchoscopy performed this morning demonstrated persistent bleeding from this infiltrative right lower lobe mass.  As such, request made for bronchial artery embolization in hopes of avoiding potential life threatening hemoptysis.  EXAM: 1. ULTRASOUND GUIDANCE FOR ARTERIAL ACCESS 2. SELECTIVE RIGHT BRONCHIAL ARTERIOGRAM (THIRD ORDER) AND FLUOROSCOPIC GUIDED PERCUTANEOUS PARTICLE EMBOLIZATION.  COMPARISON:  Chest CTA - 08/07/2014  MEDICATIONS: Fentanyl 100 mcg IV; Versed at 3 mg IV  Sedation time: 30 minutes  CONTRAST:  4m OMNIPAQUE IOHEXOL 300 MG/ML  SOLN  FLUOROSCOPY TIME:  6 minutes 36 seconds (3891mGy)  COMPLICATIONS: None immediate  TECHNIQUE: Informed written consent was obtained from the patient's family after a discussion of the risks, benefits and alternatives to treatment. Questions regarding the procedure were encouraged and answered. A timeout was performed prior to the initiation of the procedure.  The right groin was prepped and draped in the usual sterile fashion, and a sterile drape was applied covering the operative field. Maximum barrier sterile technique with sterile gowns and gloves were used for the procedure. A timeout was performed prior to the initiation of the procedure.  The right femoral head was marked fluoroscopically. Under ultrasound guidance, the right common femoral artery was accessed with a micropuncture kit after the overlying soft tissues were anesthetized with 1% lidocaine. An ultrasound image was saved for documentation purposes. The micropuncture sheath was exchanged for a 5 [place] sheath over a  Bentson wire. A closure arteriogram was performed through the side of the sheath confirming access within the right common femoral artery.  Over a  Bentson wire, a Mickelson catheter was advanced to the level of the thoracic aorta where it was back bled and flushed. The catheter was then utilized to select the hypertrophied right bronchial artery. A selective right bronchial arteriogram was performed.  With the use of a 0.014 fathom wire, a high-flow Renegade micro catheter was advanced into the distal aspect of the right bronchial artery supplying the superior segment of the right lower lobe. Repeat sub selective arteriogram was performed.  The distal aspect of the inferior segment of the right bronchial artery was then subsequently percutaneously particle embolized with approximately 3/4 of 1 vial of 300-500 micron Embospheres. The micro catheter was retracted into the caudal aspect of the right bronchial artery and a post embolization arteriogram was performed.  Images reviewed and the procedure was terminated. All wires catheters and sheaths were removed from the patient. Hemostasis was achieved at the right groin access site with deployment of an Exoseal closure device. A dressing was placed. The patient tolerated the procedure well without immediate postprocedural complication.  FINDINGS: Selective right bronchial arteriogram demonstrates marked hypertrophy of this vessel as demonstrated on preceding chest CTA.  The caudal tributaries of the right bronchial artery were noted to supply an ill-defined area of hyperemia within the superior medial aspect of the right lower lobe correlating with the spiculated mass seen on preceding chest CT. Following percutaneous selective particle embolization, there is near absence of flow into these abnormal distal vessels supplying the known spiculated mass.  IMPRESSION: Technically successful right bronchial artery percutaneous particle embolization for active bleeding from the known infiltrative mass within the superior segment of the right lower lobe.   Electronically Signed   By: Sandi Mariscal M.D.   On: 08/10/2014  18:18   Ir Angiogram Follow Up Study  08/10/2014   INDICATION: No known malignancy, now with large (approximately 4.5 x 3.0 cm) spiculated mass within the superior segment of the right lower lobe. The page underwent attempted bronchoscopic biopsy, however the biopsy could not be performed secondary to active bleeding into the airway. As such, the patient was intubated for airway protection purposes.  The patient was observed uneventfully overnight in the ICU, though repeat bronchoscopy performed this morning demonstrated persistent bleeding from this infiltrative right lower lobe mass.  As such, request made for bronchial artery embolization in hopes of avoiding potential life threatening hemoptysis.  EXAM: 1. ULTRASOUND GUIDANCE FOR ARTERIAL ACCESS 2. SELECTIVE RIGHT BRONCHIAL ARTERIOGRAM (THIRD ORDER) AND FLUOROSCOPIC GUIDED PERCUTANEOUS PARTICLE EMBOLIZATION.  COMPARISON:  Chest CTA - 08/07/2014  MEDICATIONS: Fentanyl 100 mcg IV; Versed at 3 mg IV  Sedation time: 30 minutes  CONTRAST:  13m OMNIPAQUE IOHEXOL 300 MG/ML  SOLN  FLUOROSCOPY TIME:  6 minutes 36 seconds (3518mGy)  COMPLICATIONS: None immediate  TECHNIQUE: Informed written consent was obtained from the patient's family after a discussion of the risks, benefits and alternatives to treatment. Questions regarding the procedure were encouraged and answered. A timeout was performed prior to the initiation of the procedure.  The right groin was prepped and draped in the usual sterile fashion, and a sterile drape was applied covering the operative field. Maximum barrier sterile technique with sterile gowns and gloves were used for the procedure. A timeout was performed prior to the initiation of the procedure.  The right femoral head was marked fluoroscopically. Under ultrasound guidance, the  right common femoral artery was accessed with a micropuncture kit after the overlying soft tissues were anesthetized with 1% lidocaine. An ultrasound image was saved  for documentation purposes. The micropuncture sheath was exchanged for a 5 [place] vascular sheath over a Bentson wire. A closure arteriogram was performed through the side of the sheath confirming access within the right common femoral artery.  Over a Bentson wire, a Mickelson catheter was advanced to the level of the thoracic aorta where it was back bled and flushed. The catheter was then utilized to select the hypertrophied right bronchial artery. A selective right bronchial arteriogram was performed.  With the use of a 0.014 fathom wire, a high-flow Renegade micro catheter was advanced into the distal aspect of the right bronchial artery supplying the superior segment of the right lower lobe. Repeat sub selective arteriogram was performed.  The distal aspect of the inferior segment of the right bronchial artery was then subsequently percutaneously particle embolized with approximately 3/4 of 1 vial of 300-500 micron Embospheres. The micro catheter was retracted into the caudal aspect of the right bronchial artery and a post embolization arteriogram was performed.  Images reviewed and the procedure was terminated. All wires catheters and sheaths were removed from the patient. Hemostasis was achieved at the right groin access site with deployment of an Exoseal closure device. A dressing was placed. The patient tolerated the procedure well without immediate postprocedural complication.  FINDINGS: Selective right bronchial arteriogram demonstrates marked hypertrophy of this vessel as demonstrated on preceding chest CTA.  The caudal tributaries of the right bronchial artery were noted to supply an ill-defined area of hyperemia within the superior medial aspect of the right lower lobe correlating with the spiculated mass seen on preceding chest CT. Following percutaneous selective particle embolization, there is near absence of flow into these abnormal distal vessels supplying the known spiculated mass.  IMPRESSION:  Technically successful right bronchial artery percutaneous particle embolization for active bleeding from the known infiltrative mass within the superior segment of the right lower lobe.   Electronically Signed   By: Sandi Mariscal M.D.   On: 08/10/2014 18:18   Ir US Guide Vasc Access Right  08/10/2014   INDICATION: No known malignancy, now with large (approximately 4.5 x 3.0 cm) spiculated mass within the superior segment of the right lower lobe. The page underwent attempted bronchoscopic biopsy, however the biopsy could not be performed secondary to active bleeding into the airway. As such, the patient was intubated for airway protection purposes.  The patient was observed uneventfully overnight in the ICU, though repeat bronchoscopy performed this morning demonstrated persistent bleeding from this infiltrative right lower lobe mass.  As such, request made for bronchial artery embolization in hopes of avoiding potential life threatening hemoptysis.  EXAM: 1. ULTRASOUND GUIDANCE FOR ARTERIAL ACCESS 2. SELECTIVE RIGHT BRONCHIAL ARTERIOGRAM (THIRD ORDER) AND FLUOROSCOPIC GUIDED PERCUTANEOUS PARTICLE EMBOLIZATION.  COMPARISON:  Chest CTA - 08/07/2014  MEDICATIONS: Fentanyl 100 mcg IV; Versed at 3 mg IV  Sedation time: 30 minutes  CONTRAST:  43m OMNIPAQUE IOHEXOL 300 MG/ML  SOLN  FLUOROSCOPY TIME:  6 minutes 36 seconds (3592mGy)  COMPLICATIONS: None immediate  TECHNIQUE: Informed written consent was obtained from the patient's family after a discussion of the risks, benefits and alternatives to treatment. Questions regarding the procedure were encouraged and answered. A timeout was performed prior to the initiation of the procedure.  The right groin was prepped and draped in the usual sterile fashion, and a sterile drape was  applied covering the operative field. Maximum barrier sterile technique with sterile gowns and gloves were used for the procedure. A timeout was performed prior to the initiation of the procedure.   The right femoral head was marked fluoroscopically. Under ultrasound guidance, the right common femoral artery was accessed with a micropuncture kit after the overlying soft tissues were anesthetized with 1% lidocaine. An ultrasound image was saved for documentation purposes. The micropuncture sheath was exchanged for a 5 [place] vascular sheath over a Bentson wire. A closure arteriogram was performed through the side of the sheath confirming access within the right common femoral artery.  Over a Bentson wire, a Mickelson catheter was advanced to the level of the thoracic aorta where it was back bled and flushed. The catheter was then utilized to select the hypertrophied right bronchial artery. A selective right bronchial arteriogram was performed.  With the use of a 0.014 fathom wire, a high-flow Renegade micro catheter was advanced into the distal aspect of the right bronchial artery supplying the superior segment of the right lower lobe. Repeat sub selective arteriogram was performed.  The distal aspect of the inferior segment of the right bronchial artery was then subsequently percutaneously particle embolized with approximately 3/4 of 1 vial of 300-500 micron Embospheres. The micro catheter was retracted into the caudal aspect of the right bronchial artery and a post embolization arteriogram was performed.  Images reviewed and the procedure was terminated. All wires catheters and sheaths were removed from the patient. Hemostasis was achieved at the right groin access site with deployment of an Exoseal closure device. A dressing was placed. The patient tolerated the procedure well without immediate postprocedural complication.  FINDINGS: Selective right bronchial arteriogram demonstrates marked hypertrophy of this vessel as demonstrated on preceding chest CTA.  The caudal tributaries of the right bronchial artery were noted to supply an ill-defined area of hyperemia within the superior medial aspect of the right  lower lobe correlating with the spiculated mass seen on preceding chest CT. Following percutaneous selective particle embolization, there is near absence of flow into these abnormal distal vessels supplying the known spiculated mass.  IMPRESSION: Technically successful right bronchial artery percutaneous particle embolization for active bleeding from the known infiltrative mass within the superior segment of the right lower lobe.   Electronically Signed   By: Sandi Mariscal M.D.   On: 08/10/2014 18:18   Ct Biopsy  08/16/2014   CLINICAL DATA:  Acute hemoptysis from a a large right lung mass. Hemoptysis has resolved after bronchial embolization. The patient also has a left adrenal mass and a destructive lesion of the left inferior iliac bone and acetabulum. The safest approach for tissue sampling is the destructive pelvic bone lesion which has a significant soft tissue component.  EXAM: CT GUIDED CORE BIOPSY OF LEFT ACETABULAR BONE LESION  ANESTHESIA/SEDATION: 2.0  Mg IV Versed; 50 mcg IV Fentanyl  Total Moderate Sedation Time: 20 minutes.  PROCEDURE: The procedure risks, benefits, and alternatives were explained to the patient. Questions regarding the procedure were encouraged and answered. The patient understands and consents to the procedure. A time-out was performed prior to the procedure.  The left gluteal region was prepped with Betadine in a sterile fashion, and a sterile drape was applied covering the operative field. A sterile gown and sterile gloves were used for the procedure. Local anesthesia was provided with 1% Lidocaine.  CT was performed of the bony pelvis in a prone position. After choosing a site along the left gluteal region,  a 17 gauge needle was advanced under CT guidance to the level of a destructive lesion involving the lower left iliac bone and superior acetabulum. Four separate 18 gauge core biopsy samples were then obtained through the 17 gauge needle. Material was submitted in formalin. A few  Gel-Foam pledgets were advanced through the outer needle upon completion. Post biopsy imaging was performed after needle retraction.  COMPLICATIONS: None  FINDINGS: Destructive soft tissue mass involving the inferior left iliac bone extending into the superior acetabulum measures approximately 4.5 cm in greatest diameter. Solid tissue was obtained from the lesion. There were no visible bleeding complications by CT.  IMPRESSION: CT-guided core biopsy performed of the destructive soft tissue mass within the inferior left iliac bone extending into the superior acetabulum.   Electronically Signed   By: Aletta Edouard M.D.   On: 08/16/2014 17:18   Dg Chest Port 1 View  08/11/2014   CLINICAL DATA:  History of thumb opt this cysts  EXAM: PORTABLE CHEST - 1 VIEW  COMPARISON:  Portable chest x-ray of 08/10/2014 and CT chest of 08/07/2014  FINDINGS: The endotracheal tube has been removed. There is persistent opacity overlying the right hilum and infrahilar region consistent with the patient's known carcinoma. Mild left basilar linear atelectasis is present. Heart size is stable.  IMPRESSION: Endotracheal tube removed. No change in right hilar -infrahilar opacity consistent with mass by CT.   Electronically Signed   By: Ivar Drape M.D.   On: 08/11/2014 08:02   Dg Chest Port 1 View  08/10/2014   CLINICAL DATA:  Hemoptysis  EXAM: PORTABLE CHEST - 1 VIEW  COMPARISON:  08/09/2014  FINDINGS: An endotracheal tube and nasogastric catheter are noted in satisfactory position. The cardiac shadow is within normal limits. The lungs are well aerated bilaterally. Right perihilar mass lesion is again identified similar to that noted on previous chest x-ray and CT. No pneumothorax is identified.  IMPRESSION: Stable right perihilar mass lesion.  Tubes and lines as described.   Electronically Signed   By: Inez Catalina M.D.   On: 08/10/2014 08:13   Dg Chest Port 1 View  08/09/2014   CLINICAL DATA:  Intubation, status post bronchoscopy   EXAM: PORTABLE CHEST - 1 VIEW  COMPARISON:  08/06/2014  FINDINGS: Right hilar/right lower lobe mass reidentified. Endotracheal tube is appropriately positioned. Heart size upper limits of normal. No pneumothorax. No pleural effusion. Curvilinear left lower lobe atelectasis or scarring.  IMPRESSION: No pneumothorax after bronchoscopy.   Electronically Signed   By: Conchita Paris M.D.   On: 08/09/2014 16:27   [place] Guide [employer]  08/10/2014   INDICATION: No known malignancy, now with large (approximately 4.5 x 3.0 cm) spiculated mass within the superior segment of the right lower lobe. The page underwent attempted bronchoscopic biopsy, however the biopsy could not be performed secondary to active bleeding into the airway. As such, the patient was intubated for airway protection purposes.  The patient was observed uneventfully overnight in the ICU, though repeat bronchoscopy performed this morning demonstrated persistent bleeding from this infiltrative right lower lobe mass.  As such, request made for bronchial artery embolization in hopes of avoiding potential life threatening hemoptysis.  EXAM: 1. ULTRASOUND GUIDANCE FOR ARTERIAL ACCESS 2. SELECTIVE RIGHT BRONCHIAL ARTERIOGRAM (THIRD ORDER) AND FLUOROSCOPIC GUIDED PERCUTANEOUS PARTICLE EMBOLIZATION.  COMPARISON:  Chest CTA - 08/07/2014  MEDICATIONS: Fentanyl 100 mcg IV; Versed at 3 mg IV  Sedation time: 30 minutes  CONTRAST:  40m OMNIPAQUE IOHEXOL 300 MG/ML  SOLN  FLUOROSCOPY TIME:  6 minutes 36 seconds (3099mGy)  COMPLICATIONS: None immediate  TECHNIQUE: Informed written consent was obtained from the patient's family after a discussion of the risks, benefits and alternatives to treatment. Questions regarding the procedure were encouraged and answered. A timeout was performed prior to the initiation of the procedure.  The right groin was prepped and draped in the usual sterile fashion, and a sterile drape was applied  covering the operative field. Maximum barrier sterile technique with sterile gowns and gloves were used for the procedure. A timeout was performed prior to the initiation of the procedure.  The right femoral head was marked fluoroscopically. Under ultrasound guidance, the right common femoral artery was accessed with a micropuncture kit after the overlying soft tissues were anesthetized with 1% lidocaine. An ultrasound image was saved for documentation purposes. The micropuncture sheath was exchanged for a 5 [place] sheath over a Bentson wire. A closure arteriogram was performed through the side of the sheath confirming access within the right common femoral artery.  Over a Bentson wire, a Mickelson catheter was advanced to the level of the thoracic aorta where it was back bled and flushed. The catheter was then utilized to select the hypertrophied right bronchial artery. A selective right bronchial arteriogram was performed.  With the use of a 0.014 fathom wire, a high-flow Renegade micro catheter was advanced into the distal aspect of the right bronchial artery supplying the superior segment of the right lower lobe. Repeat sub selective arteriogram was performed.  The distal aspect of the inferior segment of the right bronchial artery was then subsequently percutaneously particle embolized with approximately 3/4 of 1 vial of 300-500 micron Embospheres. The micro catheter was retracted into the caudal aspect of the right bronchial artery and a post embolization arteriogram was performed.  Images reviewed and the procedure was terminated. All wires catheters and sheaths were removed from the patient. Hemostasis was achieved at the right groin access site with deployment of an Exoseal closure device. A dressing was placed. The patient tolerated the procedure well without immediate postprocedural complication.  FINDINGS: Selective right bronchial arteriogram demonstrates marked hypertrophy of this vessel as  demonstrated on preceding chest CTA.  The caudal tributaries of the right bronchial artery were noted to supply an ill-defined area of hyperemia within the superior medial aspect of the right lower lobe correlating with the spiculated mass seen on preceding chest CT. Following percutaneous selective particle embolization, there is near absence of flow into these abnormal distal vessels supplying the known spiculated mass.  IMPRESSION: Technically successful right bronchial artery percutaneous particle embolization for active bleeding from the known infiltrative mass within the superior segment of the right lower lobe.   Electronically Signed   By: JSandi MariscalM.D.   On: 08/10/2014 18:18    Micro Results      Recent Results (from the past 240 hour(s))  Culture, blood (routine x 2)     Status: None   Collection Time: 08/07/14  5:10 PM  Result Value Ref Range Status   Specimen Description BLOOD RIGHT HAND  Final   Special Requests BOTTLES DRAWN AEROBIC AND ANAEROBIC 10CC  Final   Culture NO GROWTH 5 DAYS  Final   Report Status 08/12/2014 FINAL  Final  Culture, blood (routine x 2)     Status: None   Collection Time: 08/07/14  5:13 PM  Result Value Ref Range Status   Specimen Description  BLOOD LEFT WRIST  Final   Special Requests BOTTLES DRAWN AEROBIC AND ANAEROBIC 10CC  Final   Culture NO GROWTH 5 DAYS  Final   Report Status 08/12/2014 FINAL  Final  AFB culture with smear     Status: None (Preliminary result)   Collection Time: 08/09/14  3:30 PM  Result Value Ref Range Status   Specimen Description BRONCHIAL WASHINGS  Final   Special Requests Normal  Final   Acid Fast Smear   Final    NO ACID FAST BACILLI SEEN Performed at [facility]    Culture   Final    CULTURE WILL BE EXAMINED FOR 6 WEEKS BEFORE ISSUING A FINAL REPORT Performed at [facility]    Report Status PENDING  Incomplete  Fungus Culture with Smear     Status: None (Preliminary result)   Collection Time:  08/09/14  3:30 PM  Result Value Ref Range Status   Specimen Description BRONCHIAL WASHINGS  Final   Special Requests Normal  Final   Fungal Smear   Final    NO YEAST OR FUNGAL ELEMENTS SEEN Performed at [facility]    Culture   Final    CULTURE IN PROGRESS FOR FOUR WEEKS Performed at [facility]    Report Status PENDING  Incomplete  MRSA PCR Screening     Status: None   Collection Time: 08/09/14  7:30 PM  Result Value Ref Range Status   MRSA by PCR NEGATIVE NEGATIVE Final    Comment:        The GeneXpert MRSA Assay (FDA approved for NASAL specimens only), is one component of a comprehensive MRSA colonization surveillance program. It is not intended to diagnose MRSA infection nor to guide or monitor treatment for MRSA infections.   Urine culture     Status: None   Collection Time: 08/13/14 10:58 AM  Result Value Ref Range Status   Specimen Description URINE, RANDOM  Final   Special Requests NONE  Final   Culture 20,000 COLONIES/mL YEAST  Final   Report Status 08/14/2014 FINAL  Final  Culture, expectorated sputum-assessment     Status: None   Collection Time: 08/13/14  1:54 PM  Result Value Ref Range Status   Specimen Description SPUTUM  Final   Special Requests NONE  Final   Sputum evaluation   Final    THIS SPECIMEN IS ACCEPTABLE. RESPIRATORY CULTURE REPORT TO FOLLOW.   Report Status 08/13/2014 FINAL  Final  Culture, respiratory (NON-Expectorated)     Status: None   Collection Time: 08/13/14  1:54 PM  Result Value Ref Range Status   Specimen Description SPUTUM  Final   Special Requests NONE  Final   Gram Stain   Final    RARE WBC PRESENT, PREDOMINANTLY MONONUCLEAR RARE SQUAMOUS EPITHELIAL CELLS PRESENT FEW GRAM POSITIVE COCCI IN PAIRS IN CHAINS IN CLUSTERS FEW GRAM NEGATIVE RODS Performed at [facility]    Culture   Final    NORMAL OROPHARYNGEAL FLORA Performed at [facility]    Report Status 08/15/2014 FINAL  Final      Today   Subjective    Deborah Foster today has no headache,no chest abdominal pain,no new weakness tingling or numbness, feels much better wants to go home today.     Objective   Blood pressure 110/55, pulse 86, temperature 98.6 F (37 C), temperature source Oral, resp. rate 18, height _0  (1.499 m), weight 51.8 kg (114 lb 3.2 oz), last menstrual period  06/27/2014, SpO2 98 %.   Intake/Output Summary (Last 24 hours) at 08/17/14 1033 Last data filed at 08/17/14 0052  Gross per 24 hour  Intake    450 ml  Output    300 ml  Net    150 ml    Exam Awake Alert, Oriented x 3, No new F.N deficits, Normal affect [place].AT,PERRAL Supple Neck,No JVD, No cervical lymphadenopathy appriciated.  Symmetrical Chest wall movement, Good air movement bilaterally, CTAB RRR,No Gallops,Rubs or new Murmurs, No Parasternal Heave +ve B.Sounds, Abd Soft, Non tender, No organomegaly appriciated, No rebound -guarding or rigidity. No Cyanosis, Clubbing or edema, No new Rash or bruise   Data Review   CBC w Diff: Lab Results  Component Value Date   WBC 12.9* 08/17/2014   HGB 10.9* 08/17/2014   HCT 32.8* 08/17/2014   PLT 492* 08/17/2014   LYMPHOPCT 11* 08/11/2014   MONOPCT 7 08/11/2014   EOSPCT 0 08/11/2014   BASOPCT 0 08/11/2014    CMP: Lab Results  Component Value Date   NA 135 08/17/2014   K 3.2* 08/17/2014   CL 96* 08/17/2014   CO2 26 08/17/2014   BUN <5* 08/17/2014   CREATININE 0.41* 08/17/2014   PROT 6.1* 08/08/2014   ALBUMIN 2.7* 08/08/2014   BILITOT 0.5 08/08/2014   ALKPHOS 145* 08/08/2014   AST 16 08/08/2014   ALT 14 08/08/2014  .   Total Time in preparing paper work, data evaluation and todays exam - 35 minutes  Thurnell Lose M.D on 08/17/2014 at 10:33 AM  [facility]   Office  [phone]

## 2014-08-17 NOTE — Progress Notes (Signed)
Chaplain initiated visit with pt. Chaplain has relationship with pt from church. Chaplain provided emotional support and prayer. Chaplain informed pt of chaplain services at [facility] as well. Pt hoping to spend as much time with her family as possible. Pt's other primary concern is being able to pay for medical bills. Pt being discharged today and will continued to be supported by her church.   08/17/14 1200  Clinical Encounter Type  Visited With Patient  Visit Type Initial;Spiritual support  Consult/Referral To Faith community  Spiritual Encounters  Spiritual Needs Emotional;Prayer  Stress Factors  Patient Stress Factors Family relationships;Major life changes  Marcelino Scot 08/17/2014 12:44 PM

## 2014-08-17 NOTE — Consult Note (Signed)
Consultation Note Date: 08/17/2014   Patient Name: Deborah Foster  DOB: July 25, 1970  MRN: [id number]  Age / Sex: 44 y.o., female   PCP: Deborah Downing, MD Referring Physician: Thurnell Lose, MD  Reason for Consultation: Establishing goals of care and Psychosocial/spiritual support  Palliative Care Assessment and Plan Summary of Established Goals of Care and Medical Treatment Preferences   Clinical Assessment/Narrative: Deborah Foster is an unfortunate 44 yo female with a new diagnosis of primary lung cancer with mets to the bone, adrenal and brain.  She was seen by Oncology, Deborah Foster, today and is to be seen in the [facility] after discharge.  Deborah Foster states she has cancer and names all the locations of her newly diagnosed cancer.  She shrugs her shoulders and states "God makes", and that some people stay longer than others. She shows me pictures of her husband and three boys, all under 10.  She states she will not tell them or her mother that she has cancer, but that she is "sick".  She states "i want to keep living" for her children, mother, brother.  She talks about her support system, husband, mother, brother, and her church who sponsored them from [place].  She talks about her worries over the cost of her treatments.  We talk about the resources available at the cancer center and she is encouraged to seek help there as needed.  We also talk about "kids path" at [facility] and the benefit for her sons.   Contacts/Participants in Discussion: Primary Decision Maker: Deborah Foster is able to make her own decisions at this time, but talks about her trust of her PCP, Deborah Foster.     HCPOA: no   Code Status/Advance Care Planning:  FULL code at this time.    Symptom Management:   States pain is managed with current treatment, we discussed possible increase in bone cancer pain.   Psycho-social/Spiritual:   Support System: Husband, mother, brother, and church.   Desire for  further Chaplaincy support:Not discussed today, as Deborah Foster is being d/c'ed soon.    Prognosis: Unable to determine  Discharge Planning:  Home with Home Health       Chief Complaint:  Hemoptysis History of Present Illness:  44 year old female with no significant medical problems, saw PCP yesterday for productive cough for one week. PCP sent her for a chest x-ray which showed pneumonia and patient was called by the PCP to come to the ED for prescription for antibiotic. Patient in the ED complained of coughing up blood clots, CT angiogram was done which showed right lower lobe lung mass, also shows metastasis to the bone and Left adrenal gland. Patient denies any chest pain, no shortness of breath. No nausea vomiting or diarrhea. No fever no dysuria urgency frequency of urination. No previous history of cancer no family history of cancer. Patient is not a smoker.   Primary Diagnoses  Present on Admission:  . CAP (community acquired pneumonia) . Pneumonia  Palliative Review of Systems: Ms. Foster denies uncontrolled pain, N/V, dyspnea, or anxiety.   I have reviewed the medical record, interviewed the patient and family, and examined the patient. The following aspects are pertinent.  Past Medical History  Diagnosis Date  . Cancer    History   Social History  . Marital Status: Unknown    Spouse Name: N/A  . Number of Children: N/A  . Years of Education: N/A   Social History Main Topics  .  Smoking status: Never Smoker   . Smokeless tobacco: Not on file  . Alcohol Use: No  . Drug Use: No  . Sexual Activity: Not on file   Other Topics Concern  . None   Social History Narrative   History reviewed. No pertinent family history. Scheduled Meds: . dexamethasone  4 mg Intravenous 4 times per day  . feeding supplement  1 Container Oral BID BM  . guaiFENesin  600 mg Oral BID  . lactobacillus acidophilus  2 tablet Oral TID  . levofloxacin  750 mg Oral Daily  . metroNIDAZOLE  500 mg  Oral 3 times per day   Continuous Infusions:  PRN Meds:.acetaminophen **OR** acetaminophen, albuterol, guaiFENesin-codeine, HYDROcodone-acetaminophen, ondansetron **OR** ondansetron (ZOFRAN) IV Medications Prior to Admission:  Prior to Admission medications   Medication Sig Start Date End Date Taking? Authorizing Provider  albuterol (PROVENTIL HFA;VENTOLIN HFA) 108 (90 BASE) MCG/ACT inhaler Inhale 2 puffs into the lungs every 6 (six) hours as needed for wheezing or shortness of breath. 08/17/14   Deborah Lose, MD  dexamethasone (DECADRON) 4 MG tablet Take 1 tablet (4 mg total) by mouth 2 (two) times daily. 08/17/14   Deborah Lose, MD  HYDROcodone-acetaminophen (NORCO) 10-325 MG per tablet Take 1 tablet by mouth every 6 (six) hours as needed for moderate pain or severe pain. 08/17/14   Deborah Lose, MD  levofloxacin (LEVAQUIN) 750 MG tablet Take 1 tablet (750 mg total) by mouth daily. 08/17/14   Deborah Lose, MD  metroNIDAZOLE (FLAGYL) 500 MG tablet Take 1 tablet (500 mg total) by mouth every 8 (eight) hours. 08/17/14   Deborah Lose, MD  ondansetron (ZOFRAN) 4 MG tablet Take 1 tablet (4 mg total) by mouth every 8 (eight) hours as needed for nausea or vomiting. 08/17/14   Deborah Lose, MD   No Known Allergies CBC:    Component Value Date/Time   WBC 12.9* 08/17/2014 0553   HGB 10.9* 08/17/2014 0553   HCT 32.8* 08/17/2014 0553   PLT 492* 08/17/2014 0553   MCV 90.4 08/17/2014 0553   NEUTROABS 10.7* 08/11/2014 0218   LYMPHSABS 1.5 08/11/2014 0218   MONOABS 1.0 08/11/2014 0218   EOSABS 0.0 08/11/2014 0218   BASOSABS 0.0 08/11/2014 0218   Comprehensive Metabolic Panel:    Component Value Date/Time   NA 135 08/17/2014 0553   K 3.2* 08/17/2014 0553   CL 96* 08/17/2014 0553   CO2 26 08/17/2014 0553   BUN <5* 08/17/2014 0553   CREATININE 0.41* 08/17/2014 0553   GLUCOSE 98 08/17/2014 0553   CALCIUM 8.4* 08/17/2014 0553   AST 16 08/08/2014 0458   ALT 14 08/08/2014 0458    ALKPHOS 145* 08/08/2014 0458   BILITOT 0.5 08/08/2014 0458   PROT 6.1* 08/08/2014 0458   ALBUMIN 2.7* 08/08/2014 0458    Physical Exam: Vital Signs: BP 110/55 mmHg  Pulse 86  Temp(Src) 98.6 F (37 C) (Oral)  Resp 18  Ht '4\' 11"'$  (1.499 m)  Wt 51.8 kg (114 lb 3.2 oz)  BMI 23.05 kg/m2  SpO2 98%  LMP 06/27/2014 SpO2: SpO2: 98 % O2 Device: O2 Device: Not Delivered O2 Flow Rate: O2 Flow Rate (L/min): 2 L/min Intake/output summary:  Intake/Output Summary (Last 24 hours) at 08/17/14 1047 Last data filed at 08/17/14 0052  Gross per 24 hour  Intake    450 ml  Output    300 ml  Net    150 ml   LBM: Last BM Date: 08/15/14 Baseline  Weight: Weight: 50.349 kg (111 lb) Most recent weight: Weight: 51.8 kg (114 lb 3.2 oz)  Exam Findings:  Constitutional: well nourished, lying in bed, makes and keeps eye contact Resp:even and non labored GI:  Soft , non tender.           Palliative Performance Scale: 80%              Additional Data Reviewed: Recent Labs     08/16/14  0611  08/17/14  0553  WBC  12.6*  12.9*  HGB  10.9*  10.9*  PLT  505*  492*  NA  136  135  BUN  <5*  <5*  CREATININE  0.52  0.41*     Time In: 0905 Time Out: 1035 Time Total:  90 mintues Greater than 50%  of this time was spent counseling and coordinating care related to the above assessment and plan. Discussion regarding [place] and plan shared with nursing staff.   Signed by: Drue Novel, NP  Drue Novel, NP  08/17/2014, 10:47 AM  Please contact Palliative Medicine Team phone at [phone] for questions and concerns.

## 2014-08-17 NOTE — Plan of Care (Signed)
     Deborah Foster was admitted to the Hospital on 08/07/2014 and Discharged  08/17/2014 she and her husband [name]  should be excused from work/school  for 21 days starting 08/07/2014 , may return to work/school without any restrictions.  Call Lala Lund MD, [facility]  [phone] with questions.  Thurnell Lose M.D on 08/17/2014,at 11:34 AM  [facility]   Office  [phone]

## 2014-08-17 NOTE — Progress Notes (Signed)
Deborah Foster to be D/C'd Home per MD order.  Discussed with the patient and all questions fully answered.  VSS, Skin clean, dry and intact without evidence of skin break down, no evidence of skin tears noted. IV catheter discontinued intact. Site without signs and symptoms of complications. Dressing and pressure applied.  An After Visit Summary was printed and given to the patient. Patient received prescription.  D/c education completed with patient/family including follow up instructions, medication list, d/c activities limitations if indicated, with other d/c instructions as indicated by MD - patient able to verbalize understanding, all questions fully answered.   Patient instructed to return to ED, call 911, or call MD for any changes in condition.   Patient escorted via [place], and D/C home via private auto.  [name] F 08/17/2014 2:01 PM

## 2014-08-17 NOTE — Progress Notes (Signed)
Pt verbalizes understanding of all discharge instructions and discharged home in stable condition.

## 2014-08-17 NOTE — Discharge Instructions (Signed)
Follow with Primary MD Deborah Downing, MD in 7 days   Get CBC, CMP, 2 view Chest X ray checked  by Primary MD next visit.    Activity: As tolerated with Full fall precautions use walker/cane & assistance as needed   Disposition Home     Diet: Heart Healthy .  For Heart failure patients - Check your Weight same time everyday, if you gain over 2 pounds, or you develop in leg swelling, experience more shortness of breath or chest pain, call your Primary MD immediately. Follow Cardiac Low Salt Diet and 1.5 lit/day fluid restriction.   On your next visit with your primary care physician please Get Medicines reviewed and adjusted.   Please request your Prim.MD to go over all Hospital Tests and Procedure/Radiological results at the follow up, please get all Hospital records sent to your Prim MD by signing hospital release before you go home.   If you experience worsening of your admission symptoms, develop shortness of breath, life threatening emergency, suicidal or homicidal thoughts you must seek medical attention immediately by calling 911 or calling your MD immediately  if symptoms less severe.  You Must read complete instructions/literature along with all the possible adverse reactions/side effects for all the Medicines you take and that have been prescribed to you. Take any new Medicines after you have completely understood and accpet all the possible adverse reactions/side effects.   Do not drive, operating heavy machinery, perform activities at heights, swimming or participation in water activities or provide baby sitting services if your were admitted for syncope or siezures until you have seen by Primary MD or a Neurologist and advised to do so again.  Do not drive when taking Pain medications.    Do not take more than prescribed Pain, Sleep and Anxiety Medications  Special Instructions: If you have smoked or chewed Tobacco  in the last 2 yrs please stop smoking, stop any  regular Alcohol  and or any Recreational drug use.  Wear Seat belts while driving.   Please note  You were cared for by a hospitalist during your hospital stay. If you have any questions about your discharge medications or the care you received while you were in the hospital after you are discharged, you can call the unit and asked to speak with the hospitalist on call if the hospitalist that took care of you is not available. Once you are discharged, your primary care physician will handle any further medical issues. Please note that NO REFILLS for any discharge medications will be authorized once you are discharged, as it is imperative that you return to your primary care physician (or establish a relationship with a primary care physician if you do not have one) for your aftercare needs so that they can reassess your need for medications and monitor your lab values.

## 2014-08-18 ENCOUNTER — Other Ambulatory Visit: Payer: Self-pay | Admitting: *Deleted

## 2014-08-18 ENCOUNTER — Telehealth: Payer: Self-pay | Admitting: *Deleted

## 2014-08-18 ENCOUNTER — Encounter: Payer: Self-pay | Admitting: Radiation Oncology

## 2014-08-18 DIAGNOSIS — R918 Other nonspecific abnormal finding of lung field: Secondary | ICD-10-CM

## 2014-08-18 NOTE — Telephone Encounter (Signed)
Oncology Nurse Navigator Documentation  Oncology Nurse Navigator Flowsheets 08/18/2014  Navigator Encounter Type Clinic/MDC  Patient Visit Type Follow-up/Dr. Julien Nordmann received information today that there is not enough tissue for genetic testing.  Dr. Julien Nordmann request guardant 360 to be drawn after rad onc appt tomorrow.   Guardant 360 ordered, request form completed, and patient aware of blood test after rad onc appt.  I also clarified location of cancer center for her appt tomorrow.  She verbalized understanding of appt   Treatment Phase Abnormal Scans  Interventions Coordination of Care  Time Spent with Patient 30

## 2014-08-18 NOTE — Progress Notes (Signed)
Thoracic Location of Tumor / Histology:RLL lung mass suspicious for bronchogenic carcinomawith mets to bone and adrenal and multiple lesions to brain.  Patient presented months ago with symptoms ES:[id number] to hospital one week ago with cough and mild hemoptysis. Work-up revealed large left-sided lung mass with suspicious metastatic lesions to t-spine, left hip, adrenal gland and brain.Patient states she started having discomfort in January.Told it was sciatica. Biopsies of bone 08/16/14 FINAL DIAGNOSIS Diagnosis Bone, biopsy, Left hip/acetabular lesion - METASTATIC NON SMALL CELL CARCINOMA, CONSISTENT WITH ADENOCARCINOMA.  Tobacco/Marijuana/Snuff/ETOH use:No history of smoking of smoking or etoh.  Past/Anticipated interventions by cardiothoracic surgery, if [id number] performed on 08/09/14.  Past/Anticipated interventions by medical oncology, if any:  Signs/Symptoms  Weight changes, if any: No  Respiratory complaints, if any:   Hemoptysis, if any: No  Pain issues, if any: "5" right  Hip/leg and some throat irritation.  SAFETY ISSUES:  Prior radiation? No  Pacemaker/ICD? No  Possible current pregnancy?No.Lmp 08/13/14.  Is the patient on methotrexate? No  Current Complaints / other details:Speaks some english but needs [place] interpreter.Married x 11 years. 3 children. There were no vitals taken for this visit.

## 2014-08-19 ENCOUNTER — Ambulatory Visit
Admission: RE | Admit: 2014-08-19 | Discharge: 2014-08-19 | Disposition: A | Discharge status: Home / Self Care | Payer: Medicaid Other | Source: Ambulatory Visit | Attending: Radiation Oncology | Admitting: Radiation Oncology

## 2014-08-19 ENCOUNTER — Encounter: Payer: Self-pay | Admitting: Radiation Oncology

## 2014-08-19 ENCOUNTER — Other Ambulatory Visit: Payer: No Typology Code available for payment source

## 2014-08-19 DIAGNOSIS — Z510 Encounter for antineoplastic radiation therapy: Secondary | ICD-10-CM | POA: Insufficient documentation

## 2014-08-19 DIAGNOSIS — C7931 Secondary malignant neoplasm of brain: Secondary | ICD-10-CM

## 2014-08-19 DIAGNOSIS — C3490 Malignant neoplasm of unspecified part of unspecified bronchus or lung: Secondary | ICD-10-CM | POA: Insufficient documentation

## 2014-08-19 DIAGNOSIS — C7951 Secondary malignant neoplasm of bone: Secondary | ICD-10-CM | POA: Diagnosis not present

## 2014-08-19 DIAGNOSIS — M25552 Pain in left hip: Secondary | ICD-10-CM | POA: Insufficient documentation

## 2014-08-19 HISTORY — DX: Hemoptysis: R04.2

## 2014-08-19 HISTORY — DX: Pneumonia, unspecified organism: J18.9

## 2014-08-19 HISTORY — DX: Other nonspecific abnormal finding of lung field: R91.8

## 2014-08-19 HISTORY — DX: Hypokalemia: E87.6

## 2014-08-19 NOTE — Progress Notes (Signed)
Radiation Oncology         [phone]) [phone] ________________________________  Initial Outpatient Consultation - Date: 08/19/2014   Name: Deborah Foster MRN: [id number]   DOB: 1970-12-15  REFERRING PHYSICIAN: [name], *  DIAGNOSIS AND STAGE: Deborah Foster is a 44 year old female presenting with metastatic cancer of the lung.  HISTORY OF PRESENT ILLNESS:Deborah Foster is a 44 y.o. female who was admittied to  [facility] on 08/07/2014 with complaints of a productive cough accompanied by hemoptysis for 1 week. She bled for one hour prior to being admitted to the hospital via the request of her family doctor. A CT scan of the chest that was completed on 08/07/2014 exposed a mass lesion measuring 4.4 x 3.0 x 3.6 cm in the right lower lobe with a primary pulmonary neoplasm. A CT scan of the abdomen and pelvis completed on 08/11/2014 exposed left adrenal mass measuring 5.2 cm with central low density concerning for necrotic metastasis. There was osseous metastatic disease with a large destructive lesion involving the left acetabulum with soft tissue component medial and involvement of the superior articular surface. On 08/07/2014 the patient underwent CT-guided core biopsy of the left acetabular bone lesion by interventional radiology, which was positive for small cell lung cancer. A bronchoscopy could not be performed due to the amount of blood and nonstop blood the patient was producing. Monday night of this week, an MRI was performed. The patient does not have a history of smoking but reports pain in the left hip area. The patient was referred to radiation oncology in regards to the management of her disease, moving forward. A translator was provided for this appointment and arrived at 3:20pm. The patient denies head aches, nausea, changes in vision, fatigue, or weakness in extremities. The patient displayed a positive mental disposition and was accompanied by her husband and her friend. She was most  concerned about whether or not treatment moving forward would cause her more pain. This concern was fully addressed and translated. 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 She is a mother of three children of the ages 96, 60, and 39.   PREVIOUS RADIATION THERAPY: No  Past medical, social and family history were reviewed in the electronic chart. Review of symptoms was reviewed in the electronic chart. Medications were reviewed in the electronic chart.   PHYSICAL EXAM:  Wt Readings from Last 3 Encounters:  08/11/14 114 lb 3.2 oz (51.8 kg)   Temp Readings from Last 3 Encounters:  08/17/14 98.6 F (37 C) Oral   BP Readings from Last 3 Encounters:  08/17/14 110/55   Pulse Readings from Last 3 Encounters:  08/17/14 63  Pleasant female in no distress. Alert and oriented   IMPRESSION: Deborah Foster is a 44 year old female presenting with metastatic cancer of the lung. . All scans and charts were reviewed with the patient, her husband, her accompanying friend, and the provided translator.  PLAN: The differences between the CT scan, MRI scan, and biopsy in which the patient recently underwent and the purpose behind these procedures were explained. The results of these exams were discussed and fully translated via the provided translator. It was explained that her lung cancer has spread to other areas of her body, including her brain. It was explained that chemotherapy, radiation treatment, and hormone therapy all serve the purpose of treating different areas of her body in regards to her cancer. The traditional process that a  patient experiences when undergoing radiation therapy was discussed. Common symptoms to expect and healthy ways to manage these symptoms if they are to occur were discussed. The patient has been advised of the CT scan and planning session is to take place on 08/19/2014 and radiation therapy treatments will begin next week in August of 2016. The patient is also  aware that it will take two weeks before the results ALK/EGFR testing will be available to determine her eligibility for targeted systemic therapy.  It was extremely emphasized that the patient needs to communicate any and all symptoms with Dr. Pablo Ledger through out her treatment. Proper medications can be provided to alleviate these symptoms. The patient, her husband, and accompanying friend understands that her cancer is not curable, but can be managed with treatment. . If the patient develops any further questions or concerns in regards to her treatment, she has been encouraged to contact Dr. Pablo Ledger. Educational resources were provided to take home. All consent paperwork was reviewed and signed. A translator was provided for today's radiation oncology appointment.  I spent 60 minutes  face to face with the patient and more than 50% of that time was spent in counseling and/or coordination of care.   This document serves as a record of services personally performed by Thea Silversmith , MD. It was created on her behalf by Lenn Cal, a trained medical scribe. The creation of this record is based on the scribe's personal observations and the provider's statements to them. This document has been checked and approved by the attending provider.   ------------------------------------------------  Thea Silversmith, MD

## 2014-08-19 NOTE — Progress Notes (Signed)
Please see the Nurse Progress Note in the MD Initial Consult Encounter for this patient. 

## 2014-08-20 ENCOUNTER — Other Ambulatory Visit: Payer: Self-pay | Admitting: Medical Oncology

## 2014-08-20 ENCOUNTER — Telehealth: Payer: Self-pay | Admitting: Internal Medicine

## 2014-08-20 DIAGNOSIS — Z510 Encounter for antineoplastic radiation therapy: Secondary | ICD-10-CM | POA: Diagnosis not present

## 2014-08-20 NOTE — Telephone Encounter (Signed)
lvm with snow the interpreter to call pt and advise on Monday appt

## 2014-08-23 ENCOUNTER — Other Ambulatory Visit (HOSPITAL_BASED_OUTPATIENT_CLINIC_OR_DEPARTMENT_OTHER): Payer: No Typology Code available for payment source

## 2014-08-23 ENCOUNTER — Ambulatory Visit
Admission: RE | Admit: 2014-08-23 | Discharge: 2014-08-23 | Disposition: A | Discharge status: Home / Self Care | Payer: Medicaid Other | Source: Ambulatory Visit | Attending: Radiation Oncology | Admitting: Radiation Oncology

## 2014-08-23 DIAGNOSIS — C7951 Secondary malignant neoplasm of bone: Secondary | ICD-10-CM

## 2014-08-23 DIAGNOSIS — R918 Other nonspecific abnormal finding of lung field: Secondary | ICD-10-CM

## 2014-08-23 DIAGNOSIS — Z510 Encounter for antineoplastic radiation therapy: Secondary | ICD-10-CM | POA: Diagnosis not present

## 2014-08-24 ENCOUNTER — Telehealth: Payer: Self-pay | Admitting: Internal Medicine

## 2014-08-24 ENCOUNTER — Ambulatory Visit: Payer: No Typology Code available for payment source

## 2014-08-24 ENCOUNTER — Ambulatory Visit
Admission: RE | Admit: 2014-08-24 | Discharge: 2014-08-24 | Disposition: A | Discharge status: Home / Self Care | Payer: Medicaid Other | Source: Ambulatory Visit | Attending: Radiation Oncology | Admitting: Radiation Oncology

## 2014-08-24 ENCOUNTER — Other Ambulatory Visit: Payer: Self-pay | Admitting: *Deleted

## 2014-08-24 ENCOUNTER — Ambulatory Visit (HOSPITAL_COMMUNITY)
Admission: RE | Admit: 2014-08-24 | Discharge: 2014-08-24 | Disposition: A | Discharge status: Home / Self Care | Payer: No Typology Code available for payment source | Source: Ambulatory Visit | Attending: Family Medicine | Admitting: Family Medicine

## 2014-08-24 ENCOUNTER — Other Ambulatory Visit (HOSPITAL_COMMUNITY): Payer: Self-pay | Admitting: Family Medicine

## 2014-08-24 ENCOUNTER — Ambulatory Visit (HOSPITAL_COMMUNITY)
Admission: RE | Admit: 2014-08-24 | Discharge: 2014-08-24 | Disposition: A | Discharge status: Home / Self Care | Payer: No Typology Code available for payment source | Source: Ambulatory Visit | Attending: Radiation Oncology | Admitting: Radiation Oncology

## 2014-08-24 ENCOUNTER — Telehealth: Payer: Self-pay | Admitting: *Deleted

## 2014-08-24 ENCOUNTER — Encounter: Payer: Self-pay | Admitting: *Deleted

## 2014-08-24 VITALS — BP 111/71 | HR 81 | Temp 98.1°F | Wt 108.1 lb

## 2014-08-24 DIAGNOSIS — Z510 Encounter for antineoplastic radiation therapy: Secondary | ICD-10-CM | POA: Diagnosis not present

## 2014-08-24 DIAGNOSIS — C7931 Secondary malignant neoplasm of brain: Secondary | ICD-10-CM | POA: Diagnosis not present

## 2014-08-24 DIAGNOSIS — C3490 Malignant neoplasm of unspecified part of unspecified bronchus or lung: Secondary | ICD-10-CM

## 2014-08-24 DIAGNOSIS — R918 Other nonspecific abnormal finding of lung field: Secondary | ICD-10-CM

## 2014-08-24 MED ORDER — BIAFINE EX EMUL
CUTANEOUS | Status: DC | PRN
  Administered 2014-08-24: 12:00:00 via TOPICAL

## 2014-08-24 NOTE — Telephone Encounter (Signed)
Lab appointment added and a note was placed on her rad onc appointment to get a new schedule

## 2014-08-24 NOTE — Telephone Encounter (Signed)
Pt MD office called to sched f/u appt....done....they will give pt new appt

## 2014-08-24 NOTE — Progress Notes (Signed)
Weekly Management Note Current Dose:5 Gy  Projected Dose:30 Gy   Narrative:  The patient presents for routine under treatment assessment.  CBCT/MVCT images/Port film x-rays were reviewed.  The chart was checked. Doing well. Left hip pain continues. Occasional mild headache. Presents with interpreter.   Physical Findings:  Unchanged  Vitals:  Filed Vitals:   08/24/14 1239  BP: 111/71  Pulse: 81  Temp: 98.1 F (36.7 C)   Weight:  Wt Readings from Last 3 Encounters:  08/24/14 108 lb 1.6 oz (49.034 kg)  08/11/14 114 lb 3.2 oz (51.8 kg)   Lab Results  Component Value Date   WBC 12.9* 08/17/2014   HGB 10.9* 08/17/2014   HCT 32.8* 08/17/2014   MCV 90.4 08/17/2014   PLT 492* 08/17/2014   Lab Results  Component Value Date   CREATININE 0.41* 08/17/2014   BUN <5* 08/17/2014   NA 135 08/17/2014   K 3.2* 08/17/2014   CL 96* 08/17/2014   CO2 26 08/17/2014     Impression:  The patient is tolerating radiation.  Plan:  Continue treatment as planned. Discussed time to pain relief.

## 2014-08-24 NOTE — Progress Notes (Signed)
Oncology Nurse Navigator Documentation  Oncology Nurse Navigator Flowsheets 08/24/2014  Navigator Encounter Type Other  Patient Visit Type Follow-up/Dr. Julien Nordmann notified me that patient missed her blood test.  I called patient to check on her.  I left a vm message.  I re-ordered lab and notified scheduling.    Treatment Phase Treatment  Barriers/Navigation Needs Education  Interventions Coordination of Care  Time Spent with Patient 30

## 2014-08-24 NOTE — Progress Notes (Signed)
Pt here for patient teaching.  Pt given Radiation and You booklet, skin care instructions and Biafine.  Reviewed areas of pertinence such as  . Pt able to give teach back of use unscented/gentle soap and drink plenty of water,apply Biafine bid and avoid applying anything to skin within 4 hours of treatment. Pt demonstrated understanding, no evidence of learning, refused teaching and verbalizes understanding of information given and will contact nursing with any questions or concerns.    Video not offered as patient speaks [place]. Interpreter Emogene Morgan was here for weekly assessment and teaching.Patient does understand and speaks some english.

## 2014-08-24 NOTE — Telephone Encounter (Signed)
Called left vm message

## 2014-08-25 ENCOUNTER — Ambulatory Visit
Admission: RE | Admit: 2014-08-25 | Discharge: 2014-08-25 | Disposition: A | Discharge status: Home / Self Care | Payer: Medicaid Other | Source: Ambulatory Visit | Attending: Radiation Oncology | Admitting: Radiation Oncology

## 2014-08-25 DIAGNOSIS — Z510 Encounter for antineoplastic radiation therapy: Secondary | ICD-10-CM | POA: Diagnosis not present

## 2014-08-26 ENCOUNTER — Ambulatory Visit
Admission: RE | Admit: 2014-08-26 | Discharge: 2014-08-26 | Disposition: A | Discharge status: Home / Self Care | Payer: No Typology Code available for payment source | Source: Ambulatory Visit | Attending: Radiation Oncology | Admitting: Radiation Oncology

## 2014-08-26 ENCOUNTER — Ambulatory Visit (HOSPITAL_BASED_OUTPATIENT_CLINIC_OR_DEPARTMENT_OTHER): Payer: No Typology Code available for payment source | Admitting: Internal Medicine

## 2014-08-26 ENCOUNTER — Ambulatory Visit
Admission: RE | Admit: 2014-08-26 | Discharge: 2014-08-26 | Disposition: A | Discharge status: Home / Self Care | Payer: Medicaid Other | Source: Ambulatory Visit | Attending: Radiation Oncology | Admitting: Radiation Oncology

## 2014-08-26 ENCOUNTER — Encounter: Payer: Self-pay | Admitting: Internal Medicine

## 2014-08-26 ENCOUNTER — Telehealth: Payer: Self-pay | Admitting: Internal Medicine

## 2014-08-26 ENCOUNTER — Other Ambulatory Visit (HOSPITAL_BASED_OUTPATIENT_CLINIC_OR_DEPARTMENT_OTHER): Payer: No Typology Code available for payment source

## 2014-08-26 ENCOUNTER — Encounter: Payer: Self-pay | Admitting: *Deleted

## 2014-08-26 VITALS — BP 113/60 | HR 16 | Temp 97.8°F | Resp 16 | Ht 59.0 in | Wt 107.8 lb

## 2014-08-26 DIAGNOSIS — C3491 Malignant neoplasm of unspecified part of right bronchus or lung: Secondary | ICD-10-CM

## 2014-08-26 DIAGNOSIS — E889 Metabolic disorder, unspecified: Secondary | ICD-10-CM | POA: Insufficient documentation

## 2014-08-26 DIAGNOSIS — C7951 Secondary malignant neoplasm of bone: Secondary | ICD-10-CM | POA: Diagnosis not present

## 2014-08-26 DIAGNOSIS — C7931 Secondary malignant neoplasm of brain: Secondary | ICD-10-CM | POA: Diagnosis not present

## 2014-08-26 DIAGNOSIS — C3490 Malignant neoplasm of unspecified part of unspecified bronchus or lung: Secondary | ICD-10-CM | POA: Insufficient documentation

## 2014-08-26 DIAGNOSIS — M9080 Osteopathy in diseases classified elsewhere, unspecified site: Secondary | ICD-10-CM

## 2014-08-26 DIAGNOSIS — M898X9 Other specified disorders of bone, unspecified site: Secondary | ICD-10-CM

## 2014-08-26 DIAGNOSIS — C342 Malignant neoplasm of middle lobe, bronchus or lung: Secondary | ICD-10-CM | POA: Diagnosis not present

## 2014-08-26 DIAGNOSIS — R918 Other nonspecific abnormal finding of lung field: Secondary | ICD-10-CM

## 2014-08-26 DIAGNOSIS — Z510 Encounter for antineoplastic radiation therapy: Secondary | ICD-10-CM | POA: Diagnosis not present

## 2014-08-26 MED ORDER — FLUCONAZOLE 100 MG PO TABS: 100.0000 mg | ORAL_TABLET | Freq: Every day | ORAL | Status: DC

## 2014-08-26 NOTE — Progress Notes (Signed)
[place] [phone]   [phone]  OFFICE PROGRESS NOTE  [name], MD [place] [address]  DIAGNOSIS: Stage IV (T2a, N1, M1 B) non-small cell lung cancer, adenocarcinoma presented with right middle lobe lung mass in addition to right hilar adenopathy as well as metastatic disease to the bone, brain as well as left adrenal metastasis diagnosed in July 2016  PRIOR THERAPY: The patient is currently undergoing palliative radiotherapy to the brain as well as metastatic bone lesions in the pelvis.  CURRENT THERAPY: None.  INTERVAL HISTORY: Deborah Foster 44 y.o. female returns to the clinic today for hospital follow-up visit accompanied by her husband and her interpreter. The patient was recently diagnosed with metastatic non-small cell lung cancer, adenocarcinoma. Unfortunately the biopsy was obtained from a bony lesion and has to be decalcified and it was not good for molecular biomarker studies. The patient is feeling fine today with no specific complaints except for the pain in the pelvic area. She is currently undergoing palliative radiotherapy to the brain as well as the metastatic bone lesions and chest. She denied having any significant chest pain, shortness breath, cough or hemoptysis. The patient denied having any headache or visual changes. She has no significant nausea or vomiting. She has no fever or chills. The patient is here today for evaluation and discussion of her treatment options.  MEDICAL HISTORY: Past Medical History  Diagnosis Date  . Cancer   . Pneumonia   . Hemoptysis   . Adrenal cancer   . Lung mass   . Hypokalemia     ALLERGIES:  has No Known Allergies.  MEDICATIONS:  Current Outpatient Prescriptions  Medication Sig Dispense Refill  . albuterol (PROVENTIL HFA;VENTOLIN HFA) 108 (90 BASE) MCG/ACT inhaler Inhale 2 puffs into the lungs every 6 (six) hours as needed for wheezing or shortness  of breath. 1 Inhaler 2  . dexamethasone (DECADRON) 4 MG tablet Take 1 tablet (4 mg total) by mouth 2 (two) times daily. 30 tablet 0  . HYDROcodone-acetaminophen (NORCO) 10-325 MG per tablet Take 1 tablet by mouth every 6 (six) hours as needed for moderate pain or severe pain. 30 tablet 0  . levofloxacin (LEVAQUIN) 750 MG tablet Take 1 tablet (750 mg total) by mouth daily. 4 tablet 0  . metroNIDAZOLE (FLAGYL) 500 MG tablet Take 1 tablet (500 mg total) by mouth every 8 (eight) hours. 12 tablet 0  . ondansetron (ZOFRAN) 4 MG tablet Take 1 tablet (4 mg total) by mouth every 8 (eight) hours as needed for nausea or vomiting. 30 tablet 0  . UNABLE TO FIND Take 650 mg by mouth. Med Name:corduceps sinesis     No current facility-administered medications for this visit.    SURGICAL HISTORY:  Past Surgical History  Procedure Laterality Date  . Video bronchoscopy Bilateral 08/09/2014    Procedure: VIDEO BRONCHOSCOPY WITHOUT FLUORO;  Surgeon: [name], MD;  Location: [facility];  Service: Cardiopulmonary;  Laterality: Bilateral;    REVIEW OF SYSTEMS:  Constitutional: positive for fatigue Eyes: negative Ears, nose, mouth, throat, and face: negative Respiratory: negative Cardiovascular: negative Gastrointestinal: negative Genitourinary:negative Integument/breast: negative Hematologic/lymphatic: negative Musculoskeletal:positive for bone pain Neurological: negative Behavioral/Psych: negative Endocrine: negative Allergic/Immunologic: negative   PHYSICAL EXAMINATION: General appearance: alert, cooperative, fatigued and no distress Head: Normocephalic, without obvious abnormality, atraumatic Neck: no adenopathy, no JVD, supple, symmetrical, trachea midline and thyroid not enlarged, symmetric, no tenderness/mass/nodules Lymph nodes: Cervical, supraclavicular, and axillary  nodes normal. Resp: clear to auscultation bilaterally Back: symmetric, no curvature. ROM normal. No CVA  tenderness. Cardio: regular rate and rhythm, S1, S2 normal, no murmur, click, rub or gallop GI: soft, non-tender; bowel sounds normal; no masses,  no organomegaly Extremities: extremities normal, atraumatic, no cyanosis or edema Neurologic: Alert and oriented X 3, normal strength and tone. Normal symmetric reflexes. Normal coordination and gait  ECOG PERFORMANCE STATUS: 1 - Symptomatic but completely ambulatory  Blood pressure 113/60, pulse 16, temperature 97.8 F (36.6 C), temperature source Oral, resp. rate 16, height _0  (1.499 m), weight 107 lb 12.8 oz (48.898 kg), last menstrual period 08/12/2014, SpO2 99 %.  LABORATORY DATA: Lab Results  Component Value Date   WBC 12.9* 08/17/2014   HGB 10.9* 08/17/2014   HCT 32.8* 08/17/2014   MCV 90.4 08/17/2014   PLT 492* 08/17/2014      Chemistry      Component Value Date/Time   NA 135 08/17/2014 0553   K 3.2* 08/17/2014 0553   CL 96* 08/17/2014 0553   CO2 26 08/17/2014 0553   BUN <5* 08/17/2014 0553   CREATININE 0.41* 08/17/2014 0553      Component Value Date/Time   CALCIUM 8.4* 08/17/2014 0553   ALKPHOS 145* 08/08/2014 0458   AST 16 08/08/2014 0458   ALT 14 08/08/2014 0458   BILITOT 0.5 08/08/2014 0458       RADIOGRAPHIC STUDIES: Dg Chest 2 View  08/24/2014   CLINICAL DATA:  Followup metastatic lung cancer, adrenal mass.  EXAM: CHEST  2 VIEW  COMPARISON:  Chest radiograph 08/13/2014 and CT chest 08/07/2014.  FINDINGS: Trachea is midline. Heart size normal. Right lower lobe mass is again seen with left surrounding opacification. Left lung is grossly clear. No definite pleural fluid.  IMPRESSION: Right lower lobe mass with less surrounding opacification possibly due to improving postobstructive pneumonitis and/or pulmonary hemorrhage.   Electronically Signed   By: Lorin Picket M.D.   On: 08/24/2014 13:48   Dg Chest 2 View  08/13/2014   CLINICAL DATA:  44 year old female with shortness of breath and hemoptysis for almost 1  week. Known right lower lobe mass. Status post recent right bronchial artery embolization on 08/10/2014.  EXAM: CHEST  2 VIEW  COMPARISON:  Recent prior chest x-ray 08/11/2014  FINDINGS: Masslike opacity in the right lower lobe again demonstrated without significant interval change. Ill-defined airspace opacity in the periphery of the lesion likely consistent with a combination of postobstructive changes and possibly alveolar hemorrhage. The left lung remains relatively clear. The cardiac and mediastinal contours are unchanged. No acute osseous abnormality. Overall, very similar appearance of the chest. No new pneumothorax or pleural effusion.  IMPRESSION: Similar appearance of known right lower lobe pulmonary mass and surrounding airspace opacity likely reflecting a combination of postobstructive change and alveolar hemorrhage.  No significant interval change.   Electronically Signed   By: Jacqulynn Cadet M.D.   On: 08/13/2014 08:04   Dg Chest 2 View  08/06/2014   CLINICAL DATA:  Hemoptysis and chest pain  EXAM: CHEST  2 VIEW  COMPARISON:  None.  FINDINGS: There is focal airspace consolidation in the right middle lobe. Lungs elsewhere clear. Heart size and pulmonary vascularity are normal. No adenopathy. No bone lesions.  IMPRESSION: Right middle lobe pneumonia. Lungs otherwise clear. Followup PA and lateral chest radiographs recommended in 3-4 weeks following trial of antibiotic therapy to ensure resolution and exclude underlying malignancy.  These results will be called to the ordering clinician  or representative by the Radiologist Assistant, and communication documented in the PACS or zVision Dashboard.   Electronically Signed   By: Lowella Grip III M.D.   On: 08/06/2014 17:22   Ct Angio Chest Pe W/cm &/or Wo Cm  08/07/2014   CLINICAL DATA:  Right middle lobe pneumonia on chest x-ray, recent hemoptysis  EXAM: CT ANGIOGRAPHY CHEST WITH CONTRAST  TECHNIQUE: Multidetector CT imaging of the chest was  performed using the standard protocol during bolus administration of intravenous contrast. Multiplanar CT image reconstructions and MIPs were obtained to evaluate the vascular anatomy.  CONTRAST:  68m OMNIPAQUE IOHEXOL 350 MG/ML SOLN  COMPARISON:  Chest x-ray from the previous day  FINDINGS: Left lung is well aerated without evidence focal infiltrate or sizable effusion. The right lung is also well aerated and demonstrates some mild infiltrate within the right middle lobe. In the right lower lobe emanating from the hilum inferiorly there is a 4.4 by 3.0 by 3.6 cm peripherally spiculated mass lesion consistent with a primary pulmonary neoplasm. Distal to this there are changes of postobstructive infiltrate. There is occlusion of the lower lobe bronchus as well as significant attenuation of the lower lobe pulmonary artery. Filling defect is identified within the bronchus intermedius consistent with local in growth. Right hilar adenopathy measuring 14 mm in short axis is seen.  The thoracic aorta in its branches are within normal limits. Pulmonary artery demonstrates a normal branching pattern without evidence of pulmonary embolism. Narrowing is noted in the right lower lobe branches consistent with the underlying mass lesion.  Scanning into the upper abdomen demonstrates evidence of a 3.8 cm mass in the left adrenal gland consistent with metastatic disease. The bony structures show patchy lucencies in the thoracic vertebral bodies consistent with metastatic disease.  Review of the MIP images confirms the above findings.  IMPRESSION: Right lower lobe mass lesion with postobstructive changes and invasion into the bronchus intermedius as described consistent with a primary pulmonary neoplasm. There are changes consistent with left adrenal metastatic disease as well as bony metastatic disease.  Mild right middle lobe infiltrate.   Electronically Signed   By: MInez CatalinaM.D.   On: 08/07/2014 14:24   Mr BJeri CosWHU Contrast  08/16/2014   CLINICAL DATA:  Newly diagnosed metastatic lung cancer.  EXAM: MRI HEAD WITHOUT AND WITH CONTRAST  TECHNIQUE: Multiplanar, multiecho pulse sequences of the brain and surrounding structures were obtained without and with intravenous contrast.  CONTRAST:  134mMULTIHANCE GADOBENATE DIMEGLUMINE 529 MG/ML IV SOLN  COMPARISON:  MRI of the cervical spine January 29, 2014  FINDINGS: At least 3 cerebellar and 10 supratentorial enhancing metastasis seen, many of which show susceptibility artifact compatible with hemorrhage, a few demonstrate intrinsic T1 shortening consistent with subacute blood products.  Lesions include:  Infratentorial: Less than 5 mm RIGHT, less than 5 mm LEFT, 9 x 7 mm LEFT cerebellum.  Supratentorial: 36 x 24 mm LEFT temporal lobe, 5 x 8 mm RIGHT occipital lobe, 12 x 9 mm RIGHT occipital lobe, 19 x 22 mm LEFT frontal lobe, 17 x 12 mm RIGHT frontal lobe, 12 x 13 mm RIGHT parietal lobe in addition to less than 5 mm RIGHT frontal superior gyrus, RIGHT posterior frontal lobe, LEFT temporal parietal, LEFT temporal lobe lesions.  All lesions demonstrate T2 bright vasogenic edema and local mass effect. 9 mm LEFT-to-RIGHT midline shift, with partially effaced LEFT lateral ventricle, and mild entrapment of LEFT temporal horn. Mild LEFT uncal herniation and effacement of  the LEFT quadrigeminal and ambient cistern. No reduced diffusion to suggest acute ischemia.  No abnormal extra-axial fluid collections, extra all axial enhancement or extra-axial masses. Normal major intracranial vascular flow voids seen at the skull base.  Ocular globes and orbital contents unremarkable. Paranasal sinuses and mastoid air cells are well aerated. Subcentimeter probable hemangiomas in the calvarium though fat saturated post gadolinium sequences would be more sensitive.  IMPRESSION: At least 3 cerebellar and 10 supratentorial intraparenchymal metastasis, largest in LEFT temporal lobe measuring 3.6 x 2.4 cm,  resulting in local mass effect including uncal herniation and effaced LEFT ambient/quadrigeminal cistern.  9 mm LEFT-to-RIGHT midline shift. Mild/early LEFT temporal horn entrapment.   Electronically Signed   By: Elon Alas M.D.   On: 08/16/2014 23:52   Ct Abdomen Pelvis W Contrast  08/11/2014   CLINICAL DATA:  Lung cancer. Recent diagnosis of right lower lobe mass with left adrenal nodule and bony metastatic disease.  EXAM: CT ABDOMEN AND PELVIS WITH CONTRAST  TECHNIQUE: Multidetector CT imaging of the abdomen and pelvis was performed using the standard protocol following bolus administration of intravenous contrast.  CONTRAST:  39m OMNIPAQUE IOHEXOL 300 MG/ML  SOLN  COMPARISON:  Chest CT 08/07/2014  FINDINGS: Lower chest: Right lower lobe mass is partially included, there is increased adjacent ground-glass opacity and consolidation, likely related to hemorrhage. No definite right pleural effusion. Minimal atelectasis in the left lower lobe with tiny subpulmonic left pleural effusion.  Hepatobiliary: Intra and extrahepatic biliary ductal dilatation, the common bile duct measures up to 1.7 cm proximally with tapering at the duodenum all insertion. Mild central intra and extrahepatic biliary ductal dilatation. The gallbladder is physiologically distended. Punctate, less than 2 mm hypodensity in the subcapsular right lobe of the liver. There is otherwise no focal hepatic lesion or signs of hepatic metastatic disease.  Pancreas: Normal, no pancreatic ductal dilatation. No focal pancreatic lesion, particularly at the pancreatic head.  Spleen: Normal in size without focal lesion.  Adrenals/Urinary Tract: Heterogeneous left adrenal mass measuring 5.2 x 2.5 cm with central low density component concerning for necrosis. The right adrenal gland appears normal. Kidneys demonstrate symmetric enhancement and excretion. No hydronephrosis. Mm cortical hypodensity in the lower right kidney, too small to characterize.   Stomach/Bowel: Stomach is physiologically distended. There are no dilated or thickened bowel loops. The appendix is normal. Small to moderate volume of colonic stool without colonic wall thickening.  Vascular/Lymphatic: Small retroperitoneal lymph nodes all measuring less than 3 mm in short axis dimension. No pathologic retroperitoneal, mesenteric, or inguinal adenopathy. The abdominal aorta is normal in caliber.  Reproductive: The uterus is normal for age. There is is 3 cm left ovarian cyst. Physiologic follicles in the right ovary. No adnexal mass.  Bladder:  Physiologically distended.  Other: No ascites.  Musculoskeletal: Osseous metastatic disease involving the left acetabulum measuring 4.0 x 3.7 cm. There is destruction of the medial cortex with soft tissue component and involvement of the superior articular surface. Cortical thinning laterally. Additional multifocal lytic lesions in the left ischium. Lytic lesion in the left femoral neck without definite cortical thinning. Sclerotic density in the right femoral neck. Additional small lucent lesions throughout the bony pelvis. Questionable small lytic lesions in L1 and L2 vertebral bodies.  IMPRESSION: 1. Left adrenal mass measures 5.2 cm with central low density concerning for necrotic metastasis. 2. Osseous metastatic disease. Large destructive lesion involving the left acetabulum with soft tissue component medial and involvement of the superior articular surface. Additional osseous metastasis  in the left femoral neck, throughout the bony pelvis, and likely within L1 and L2 vertebral bodies. 3. Intra and extrahepatic biliary ductal dilatation without definable cause. No gallbladder distention or pancreatic ductal dilatation. No obstructing pancreatic lesion. 4. Tiny 2 mm subcapsular hypodensity in the right lobe of the liver, too small to characterize. Small 4 mm hypodensity in the lower right kidney, also too small to characterize.   Electronically Signed    By: Jeb Levering M.D.   On: 08/11/2014 21:45   Ir Aorta/thoracic  08/10/2014   INDICATION: No known malignancy, now with large (approximately 4.5 x 3.0 cm) spiculated mass within the superior segment of the right lower lobe. The page underwent attempted bronchoscopic biopsy, however the biopsy could not be performed secondary to active bleeding into the airway. As such, the patient was intubated for airway protection purposes.  The patient was observed uneventfully overnight in the ICU, though repeat bronchoscopy performed this morning demonstrated persistent bleeding from this infiltrative right lower lobe mass.  As such, request made for bronchial artery embolization in hopes of avoiding potential life threatening hemoptysis.  EXAM: 1. ULTRASOUND GUIDANCE FOR ARTERIAL ACCESS 2. SELECTIVE RIGHT BRONCHIAL ARTERIOGRAM (THIRD ORDER) AND FLUOROSCOPIC GUIDED PERCUTANEOUS PARTICLE EMBOLIZATION.  COMPARISON:  Chest CTA - 08/07/2014  MEDICATIONS: Fentanyl 100 mcg IV; Versed at 3 mg IV  Sedation time: 30 minutes  CONTRAST:  42m OMNIPAQUE IOHEXOL 300 MG/ML  SOLN  FLUOROSCOPY TIME:  6 minutes 36 seconds (3854mGy)  COMPLICATIONS: None immediate  TECHNIQUE: Informed written consent was obtained from the patient's family after a discussion of the risks, benefits and alternatives to treatment. Questions regarding the procedure were encouraged and answered. A timeout was performed prior to the initiation of the procedure.  The right groin was prepped and draped in the usual sterile fashion, and a sterile drape was applied covering the operative field. Maximum barrier sterile technique with sterile gowns and gloves were used for the procedure. A timeout was performed prior to the initiation of the procedure.  The right femoral head was marked fluoroscopically. Under ultrasound guidance, the right common femoral artery was accessed with a micropuncture kit after the overlying soft tissues were anesthetized with 1% lidocaine.  An ultrasound image was saved for documentation purposes. The micropuncture sheath was exchanged for a 5 [place] sheath over a Bentson wire. A closure arteriogram was performed through the side of the sheath confirming access within the right common femoral artery.  Over a Bentson wire, a Mickelson catheter was advanced to the level of the thoracic aorta where it was back bled and flushed. The catheter was then utilized to select the hypertrophied right bronchial artery. A selective right bronchial arteriogram was performed.  With the use of a 0.014 fathom wire, a high-flow Renegade micro catheter was advanced into the distal aspect of the right bronchial artery supplying the superior segment of the right lower lobe. Repeat sub selective arteriogram was performed.  The distal aspect of the inferior segment of the right bronchial artery was then subsequently percutaneously particle embolized with approximately 3/4 of 1 vial of 300-500 micron Embospheres. The micro catheter was retracted into the caudal aspect of the right bronchial artery and a post embolization arteriogram was performed.  Images reviewed and the procedure was terminated. All wires catheters and sheaths were removed from the patient. Hemostasis was achieved at the right groin access site with deployment of an Exoseal closure device. A dressing was placed. The patient tolerated the procedure well without  immediate postprocedural complication.  FINDINGS: Selective right bronchial arteriogram demonstrates marked hypertrophy of this vessel as demonstrated on preceding chest CTA.  The caudal tributaries of the right bronchial artery were noted to supply an ill-defined area of hyperemia within the superior medial aspect of the right lower lobe correlating with the spiculated mass seen on preceding chest CT. Following percutaneous selective particle embolization, there is near absence of flow into these abnormal distal vessels supplying the known  spiculated mass.  IMPRESSION: Technically successful right bronchial artery percutaneous particle embolization for active bleeding from the known infiltrative mass within the superior segment of the right lower lobe.   Electronically Signed   By: Sandi Mariscal M.D.   On: 08/10/2014 18:18   Ir Angiogram Selective Each Additional Vessel  08/10/2014   INDICATION: No known malignancy, now with large (approximately 4.5 x 3.0 cm) spiculated mass within the superior segment of the right lower lobe. The page underwent attempted bronchoscopic biopsy, however the biopsy could not be performed secondary to active bleeding into the airway. As such, the patient was intubated for airway protection purposes.  The patient was observed uneventfully overnight in the ICU, though repeat bronchoscopy performed this morning demonstrated persistent bleeding from this infiltrative right lower lobe mass.  As such, request made for bronchial artery embolization in hopes of avoiding potential life threatening hemoptysis.  EXAM: 1. ULTRASOUND GUIDANCE FOR ARTERIAL ACCESS 2. SELECTIVE RIGHT BRONCHIAL ARTERIOGRAM (THIRD ORDER) AND FLUOROSCOPIC GUIDED PERCUTANEOUS PARTICLE EMBOLIZATION.  COMPARISON:  Chest CTA - 08/07/2014  MEDICATIONS: Fentanyl 100 mcg IV; Versed at 3 mg IV  Sedation time: 30 minutes  CONTRAST:  6m OMNIPAQUE IOHEXOL 300 MG/ML  SOLN  FLUOROSCOPY TIME:  6 minutes 36 seconds (3562mGy)  COMPLICATIONS: None immediate  TECHNIQUE: Informed written consent was obtained from the patient's family after a discussion of the risks, benefits and alternatives to treatment. Questions regarding the procedure were encouraged and answered. A timeout was performed prior to the initiation of the procedure.  The right groin was prepped and draped in the usual sterile fashion, and a sterile drape was applied covering the operative field. Maximum barrier sterile technique with sterile gowns and gloves were used for the procedure. A timeout was  performed prior to the initiation of the procedure.  The right femoral head was marked fluoroscopically. Under ultrasound guidance, the right common femoral artery was accessed with a micropuncture kit after the overlying soft tissues were anesthetized with 1% lidocaine. An ultrasound image was saved for documentation purposes. The micropuncture sheath was exchanged for a 5 [place] sheath over a Bentson wire. A closure arteriogram was performed through the side of the sheath confirming access within the right common femoral artery.  Over a Bentson wire, a Mickelson catheter was advanced to the level of the thoracic aorta where it was back bled and flushed. The catheter was then utilized to select the hypertrophied right bronchial artery. A selective right bronchial arteriogram was performed.  With the use of a 0.014 fathom wire, a high-flow Renegade micro catheter was advanced into the distal aspect of the right bronchial artery supplying the superior segment of the right lower lobe. Repeat sub selective arteriogram was performed.  The distal aspect of the inferior segment of the right bronchial artery was then subsequently percutaneously particle embolized with approximately 3/4 of 1 vial of 300-500 micron Embospheres. The micro catheter was retracted into the caudal aspect of the right bronchial artery and a post embolization arteriogram was performed.  Images reviewed  and the procedure was terminated. All wires catheters and sheaths were removed from the patient. Hemostasis was achieved at the right groin access site with deployment of an Exoseal closure device. A dressing was placed. The patient tolerated the procedure well without immediate postprocedural complication.  FINDINGS: Selective right bronchial arteriogram demonstrates marked hypertrophy of this vessel as demonstrated on preceding chest CTA.  The caudal tributaries of the right bronchial artery were noted to supply an ill-defined area of  hyperemia within the superior medial aspect of the right lower lobe correlating with the spiculated mass seen on preceding chest CT. Following percutaneous selective particle embolization, there is near absence of flow into these abnormal distal vessels supplying the known spiculated mass.  IMPRESSION: Technically successful right bronchial artery percutaneous particle embolization for active bleeding from the known infiltrative mass within the superior segment of the right lower lobe.   Electronically Signed   By: Sandi Mariscal M.D.   On: 08/10/2014 18:18   Ir Angiogram Follow Up Study  08/10/2014   INDICATION: No known malignancy, now with large (approximately 4.5 x 3.0 cm) spiculated mass within the superior segment of the right lower lobe. The page underwent attempted bronchoscopic biopsy, however the biopsy could not be performed secondary to active bleeding into the airway. As such, the patient was intubated for airway protection purposes.  The patient was observed uneventfully overnight in the ICU, though repeat bronchoscopy performed this morning demonstrated persistent bleeding from this infiltrative right lower lobe mass.  As such, request made for bronchial artery embolization in hopes of avoiding potential life threatening hemoptysis.  EXAM: 1. ULTRASOUND GUIDANCE FOR ARTERIAL ACCESS 2. SELECTIVE RIGHT BRONCHIAL ARTERIOGRAM (THIRD ORDER) AND FLUOROSCOPIC GUIDED PERCUTANEOUS PARTICLE EMBOLIZATION.  COMPARISON:  Chest CTA - 08/07/2014  MEDICATIONS: Fentanyl 100 mcg IV; Versed at 3 mg IV  Sedation time: 30 minutes  CONTRAST:  29m OMNIPAQUE IOHEXOL 300 MG/ML  SOLN  FLUOROSCOPY TIME:  6 minutes 36 seconds (3341mGy)  COMPLICATIONS: None immediate  TECHNIQUE: Informed written consent was obtained from the patient's family after a discussion of the risks, benefits and alternatives to treatment. Questions regarding the procedure were encouraged and answered. A timeout was performed prior to the initiation of  the procedure.  The right groin was prepped and draped in the usual sterile fashion, and a sterile drape was applied covering the operative field. Maximum barrier sterile technique with sterile gowns and gloves were used for the procedure. A timeout was performed prior to the initiation of the procedure.  The right femoral head was marked fluoroscopically. Under ultrasound guidance, the right common femoral artery was accessed with a micropuncture kit after the overlying soft tissues were anesthetized with 1% lidocaine. An ultrasound image was saved for documentation purposes. The micropuncture sheath was exchanged for a 5 [place] sheath over a Bentson wire. A closure arteriogram was performed through the side of the sheath confirming access within the right common femoral artery.  Over a Bentson wire, a Mickelson catheter was advanced to the level of the thoracic aorta where it was back bled and flushed. The catheter was then utilized to select the hypertrophied right bronchial artery. A selective right bronchial arteriogram was performed.  With the use of a 0.014 fathom wire, a high-flow Renegade micro catheter was advanced into the distal aspect of the right bronchial artery supplying the superior segment of the right lower lobe. Repeat sub selective arteriogram was performed.  The distal aspect of the inferior segment of the right bronchial  artery was then subsequently percutaneously particle embolized with approximately 3/4 of 1 vial of 300-500 micron Embospheres. The micro catheter was retracted into the caudal aspect of the right bronchial artery and a post embolization arteriogram was performed.  Images reviewed and the procedure was terminated. All wires catheters and sheaths were removed from the patient. Hemostasis was achieved at the right groin access site with deployment of an Exoseal closure device. A dressing was placed. The patient tolerated the procedure well without immediate  postprocedural complication.  FINDINGS: Selective right bronchial arteriogram demonstrates marked hypertrophy of this vessel as demonstrated on preceding chest CTA.  The caudal tributaries of the right bronchial artery were noted to supply an ill-defined area of hyperemia within the superior medial aspect of the right lower lobe correlating with the spiculated mass seen on preceding chest CT. Following percutaneous selective particle embolization, there is near absence of flow into these abnormal distal vessels supplying the known spiculated mass.  IMPRESSION: Technically successful right bronchial artery percutaneous particle embolization for active bleeding from the known infiltrative mass within the superior segment of the right lower lobe.   Electronically Signed   By: Sandi Mariscal M.D.   On: 08/10/2014 18:18   Ir US Guide Vasc Access Right  08/10/2014   INDICATION: No known malignancy, now with large (approximately 4.5 x 3.0 cm) spiculated mass within the superior segment of the right lower lobe. The page underwent attempted bronchoscopic biopsy, however the biopsy could not be performed secondary to active bleeding into the airway. As such, the patient was intubated for airway protection purposes.  The patient was observed uneventfully overnight in the ICU, though repeat bronchoscopy performed this morning demonstrated persistent bleeding from this infiltrative right lower lobe mass.  As such, request made for bronchial artery embolization in hopes of avoiding potential life threatening hemoptysis.  EXAM: 1. ULTRASOUND GUIDANCE FOR ARTERIAL ACCESS 2. SELECTIVE RIGHT BRONCHIAL ARTERIOGRAM (THIRD ORDER) AND FLUOROSCOPIC GUIDED PERCUTANEOUS PARTICLE EMBOLIZATION.  COMPARISON:  Chest CTA - 08/07/2014  MEDICATIONS: Fentanyl 100 mcg IV; Versed at 3 mg IV  Sedation time: 30 minutes  CONTRAST:  31m OMNIPAQUE IOHEXOL 300 MG/ML  SOLN  FLUOROSCOPY TIME:  6 minutes 36 seconds (3785mGy)  COMPLICATIONS: None immediate   TECHNIQUE: Informed written consent was obtained from the patient's family after a discussion of the risks, benefits and alternatives to treatment. Questions regarding the procedure were encouraged and answered. A timeout was performed prior to the initiation of the procedure.  The right groin was prepped and draped in the usual sterile fashion, and a sterile drape was applied covering the operative field. Maximum barrier sterile technique with sterile gowns and gloves were used for the procedure. A timeout was performed prior to the initiation of the procedure.  The right femoral head was marked fluoroscopically. Under ultrasound guidance, the right common femoral artery was accessed with a micropuncture kit after the overlying soft tissues were anesthetized with 1% lidocaine. An ultrasound image was saved for documentation purposes. The micropuncture sheath was exchanged for a 5 [place] sheath over a Bentson wire. A closure arteriogram was performed through the side of the sheath confirming access within the right common femoral artery.  Over a Bentson wire, a Mickelson catheter was advanced to the level of the thoracic aorta where it was back bled and flushed. The catheter was then utilized to select the hypertrophied right bronchial artery. A selective right bronchial arteriogram was performed.  With the use of a 0.014 fathom wire, a  high-flow Renegade micro catheter was advanced into the distal aspect of the right bronchial artery supplying the superior segment of the right lower lobe. Repeat sub selective arteriogram was performed.  The distal aspect of the inferior segment of the right bronchial artery was then subsequently percutaneously particle embolized with approximately 3/4 of 1 vial of 300-500 micron Embospheres. The micro catheter was retracted into the caudal aspect of the right bronchial artery and a post embolization arteriogram was performed.  Images reviewed and the procedure was  terminated. All wires catheters and sheaths were removed from the patient. Hemostasis was achieved at the right groin access site with deployment of an Exoseal closure device. A dressing was placed. The patient tolerated the procedure well without immediate postprocedural complication.  FINDINGS: Selective right bronchial arteriogram demonstrates marked hypertrophy of this vessel as demonstrated on preceding chest CTA.  The caudal tributaries of the right bronchial artery were noted to supply an ill-defined area of hyperemia within the superior medial aspect of the right lower lobe correlating with the spiculated mass seen on preceding chest CT. Following percutaneous selective particle embolization, there is near absence of flow into these abnormal distal vessels supplying the known spiculated mass.  IMPRESSION: Technically successful right bronchial artery percutaneous particle embolization for active bleeding from the known infiltrative mass within the superior segment of the right lower lobe.   Electronically Signed   By: Sandi Mariscal M.D.   On: 08/10/2014 18:18   Ct Biopsy  08/16/2014   CLINICAL DATA:  Acute hemoptysis from a a large right lung mass. Hemoptysis has resolved after bronchial embolization. The patient also has a left adrenal mass and a destructive lesion of the left inferior iliac bone and acetabulum. The safest approach for tissue sampling is the destructive pelvic bone lesion which has a significant soft tissue component.  EXAM: CT GUIDED CORE BIOPSY OF LEFT ACETABULAR BONE LESION  ANESTHESIA/SEDATION: 2.0  Mg IV Versed; 50 mcg IV Fentanyl  Total Moderate Sedation Time: 20 minutes.  PROCEDURE: The procedure risks, benefits, and alternatives were explained to the patient. Questions regarding the procedure were encouraged and answered. The patient understands and consents to the procedure. A time-out was performed prior to the procedure.  The left gluteal region was prepped with Betadine in a  sterile fashion, and a sterile drape was applied covering the operative field. A sterile gown and sterile gloves were used for the procedure. Local anesthesia was provided with 1% Lidocaine.  CT was performed of the bony pelvis in a prone position. After choosing a site along the left gluteal region, a 17 gauge needle was advanced under CT guidance to the level of a destructive lesion involving the lower left iliac bone and superior acetabulum. Four separate 18 gauge core biopsy samples were then obtained through the 17 gauge needle. Material was submitted in formalin. A few Gel-Foam pledgets were advanced through the outer needle upon completion. Post biopsy imaging was performed after needle retraction.  COMPLICATIONS: None  FINDINGS: Destructive soft tissue mass involving the inferior left iliac bone extending into the superior acetabulum measures approximately 4.5 cm in greatest diameter. Solid tissue was obtained from the lesion. There were no visible bleeding complications by CT.  IMPRESSION: CT-guided core biopsy performed of the destructive soft tissue mass within the inferior left iliac bone extending into the superior acetabulum.   Electronically Signed   By: Aletta Edouard M.D.   On: 08/16/2014 17:18   Dg Chest Port 1 View  08/11/2014  CLINICAL DATA:  History of thumb opt this cysts  EXAM: PORTABLE CHEST - 1 VIEW  COMPARISON:  Portable chest x-ray of 08/10/2014 and CT chest of 08/07/2014  FINDINGS: The endotracheal tube has been removed. There is persistent opacity overlying the right hilum and infrahilar region consistent with the patient's known carcinoma. Mild left basilar linear atelectasis is present. Heart size is stable.  IMPRESSION: Endotracheal tube removed. No change in right hilar -infrahilar opacity consistent with mass by CT.   Electronically Signed   By: Ivar Drape M.D.   On: 08/11/2014 08:02   Dg Chest Port 1 View  08/10/2014   CLINICAL DATA:  Hemoptysis  EXAM: PORTABLE CHEST - 1  VIEW  COMPARISON:  08/09/2014  FINDINGS: An endotracheal tube and nasogastric catheter are noted in satisfactory position. The cardiac shadow is within normal limits. The lungs are well aerated bilaterally. Right perihilar mass lesion is again identified similar to that noted on previous chest x-ray and CT. No pneumothorax is identified.  IMPRESSION: Stable right perihilar mass lesion.  Tubes and lines as described.   Electronically Signed   By: Deborah Catalina M.D.   On: 08/10/2014 08:13   Dg Chest Port 1 View  08/09/2014   CLINICAL DATA:  Intubation, status post bronchoscopy  EXAM: PORTABLE CHEST - 1 VIEW  COMPARISON:  08/06/2014  FINDINGS: Right hilar/right lower lobe mass reidentified. Endotracheal tube is appropriately positioned. Heart size upper limits of normal. No pneumothorax. No pleural effusion. Curvilinear left lower lobe atelectasis or scarring.  IMPRESSION: No pneumothorax after bronchoscopy.   Electronically Signed   By: Conchita Paris M.D.   On: 08/09/2014 16:27   [place] [facility] [employer]  08/10/2014   INDICATION: No known malignancy, now with large (approximately 4.5 x 3.0 cm) spiculated mass within the superior segment of the right lower lobe. The page underwent attempted bronchoscopic biopsy, however the biopsy could not be performed secondary to active bleeding into the airway. As such, the patient was intubated for airway protection purposes.  The patient was observed uneventfully overnight in the ICU, though repeat bronchoscopy performed this morning demonstrated persistent bleeding from this infiltrative right lower lobe mass.  As such, request made for bronchial artery embolization in hopes of avoiding potential life threatening hemoptysis.  EXAM: 1. ULTRASOUND GUIDANCE FOR ARTERIAL ACCESS 2. SELECTIVE RIGHT BRONCHIAL ARTERIOGRAM (THIRD ORDER) AND FLUOROSCOPIC GUIDED PERCUTANEOUS PARTICLE EMBOLIZATION.  COMPARISON:  Chest CTA - 08/07/2014  MEDICATIONS:  Fentanyl 100 mcg IV; Versed at 3 mg IV  Sedation time: 30 minutes  CONTRAST:  69m OMNIPAQUE IOHEXOL 300 MG/ML  SOLN  FLUOROSCOPY TIME:  6 minutes 36 seconds (3683mGy)  COMPLICATIONS: None immediate  TECHNIQUE: Informed written consent was obtained from the patient's family after a discussion of the risks, benefits and alternatives to treatment. Questions regarding the procedure were encouraged and answered. A timeout was performed prior to the initiation of the procedure.  The right groin was prepped and draped in the usual sterile fashion, and a sterile drape was applied covering the operative field. Maximum barrier sterile technique with sterile gowns and gloves were used for the procedure. A timeout was performed prior to the initiation of the procedure.  The right femoral head was marked fluoroscopically. Under ultrasound guidance, the right common femoral artery was accessed with a micropuncture kit after the overlying soft tissues were anesthetized with 1% lidocaine. An ultrasound image was saved for documentation purposes. The micropuncture sheath was exchanged  for a 5 [place] vascular sheath over a Bentson wire. A closure arteriogram was performed through the side of the sheath confirming access within the right common femoral artery.  Over a Bentson wire, a Mickelson catheter was advanced to the level of the thoracic aorta where it was back bled and flushed. The catheter was then utilized to select the hypertrophied right bronchial artery. A selective right bronchial arteriogram was performed.  With the use of a 0.014 fathom wire, a high-flow Renegade micro catheter was advanced into the distal aspect of the right bronchial artery supplying the superior segment of the right lower lobe. Repeat sub selective arteriogram was performed.  The distal aspect of the inferior segment of the right bronchial artery was then subsequently percutaneously particle embolized with approximately 3/4 of 1 vial of 300-500  micron Embospheres. The micro catheter was retracted into the caudal aspect of the right bronchial artery and a post embolization arteriogram was performed.  Images reviewed and the procedure was terminated. All wires catheters and sheaths were removed from the patient. Hemostasis was achieved at the right groin access site with deployment of an Exoseal closure device. A dressing was placed. The patient tolerated the procedure well without immediate postprocedural complication.  FINDINGS: Selective right bronchial arteriogram demonstrates marked hypertrophy of this vessel as demonstrated on preceding chest CTA.  The caudal tributaries of the right bronchial artery were noted to supply an ill-defined area of hyperemia within the superior medial aspect of the right lower lobe correlating with the spiculated mass seen on preceding chest CT. Following percutaneous selective particle embolization, there is near absence of flow into these abnormal distal vessels supplying the known spiculated mass.  IMPRESSION: Technically successful right bronchial artery percutaneous particle embolization for active bleeding from the known infiltrative mass within the superior segment of the right lower lobe.   Electronically Signed   By: Sandi Mariscal M.D.   On: 08/10/2014 18:18    ASSESSMENT AND PLAN: This is a very pleasant 44 years old Asian female never smoker with recently diagnosed with stage IV non-small cell lung cancer, adenocarcinoma presented with right middle lobe lung mass in addition to right hilar adenopathy and metastatic disease to the bones, brain and left adrenal gland.  I had a lengthy discussion with the patient and her husband today about her current disease status, prognosis and treatment options. I recommended for the patient to continue with her palliative radiotherapy as scheduled. I will complete the staging workup by ordering a PET scan. Unfortunately there was insufficient material to be tested for  molecular biomarkers. I will send a blood sample for EGFR mutation as well as Guardant 360 for molecular biomarkers testing. If the patient has EGFR mutation or ALK gene translocation, she will be treated with targeted therapy.  If the patient has no actionable mutations, I would consider her for treatment with systemic chemotherapy with carboplatin and Alimta. I will arrange for the patient to come back for follow-up visit in 2 weeks for reevaluation and more detailed discussion of her treatment options. She was advised to call immediately if she has any concerning symptoms in the interval. The patient voices understanding of current disease status and treatment options and is in agreement with the current care plan.  All questions were answered. The patient knows to call the clinic with any problems, questions or concerns. We can certainly see the patient much sooner if necessary.  I spent 20 minutes counseling the patient face to face. The total time  spent in the appointment was 30 minutes.  Disclaimer: This note was dictated with voice recognition software. Similar sounding words can inadvertently be transcribed and may not be corrected upon review.

## 2014-08-26 NOTE — Progress Notes (Signed)
Oncology Nurse Navigator Documentation  Oncology Nurse Navigator Flowsheets 08/26/2014  Navigator Encounter Type Initial MedOnc  Patient Visit Type Initial/Followed up with patient today at [facility].  I spoke with patient in the hospital last week.  Interpreter is with her and her husband.    Patient needed help coordinating appt. I spoke with lab to coordinate.    Treatment Phase Treatment;Abnormal Scans  Barriers/Navigation Needs Education/laugage   Education Other  Interventions Coordination of Care  Time Spent with Patient 30

## 2014-08-26 NOTE — Telephone Encounter (Signed)
Pt confirmed labs/ov per 08/11 POF, gave pt avs and calendar.... KJ

## 2014-08-26 NOTE — Progress Notes (Signed)
  Radiation Oncology         (336) [phone] ________________________________  Name: Deborah Foster MRN: [id number]  Date: 08/26/2014  DOB: 01-10-1971  Weekly Radiation Therapy Management    ICD-9-CM ICD-10-CM   1. Brain metastases 198.3 C79.31     Current Dose: 10 Gy     Planned Dose:  30 Gy  Narrative . . . . . . . . The patient presents for routine under treatment assessment.                                   The patient requested to be seen. She has noticed uncomfortable feeling in her mouth and noticed some changes on her tongue.                                 Set-up films were reviewed.                                 The chart was checked. Physical Findings. . . A whitish coating on the dorsum of the tongue consistent with candidiasis infection. Impression . . . . . . . The patient is tolerating radiation. Plan . . . . . . . . . . . . Continue treatment as planned. The patient will be placed on Diflucan.  ________________________________   Blair Promise, PhD, MD

## 2014-08-27 ENCOUNTER — Encounter: Payer: Self-pay | Admitting: Adult Health

## 2014-08-27 ENCOUNTER — Ambulatory Visit
Admission: RE | Admit: 2014-08-27 | Discharge: 2014-08-27 | Disposition: A | Discharge status: Home / Self Care | Payer: Medicaid Other | Source: Ambulatory Visit | Attending: Radiation Oncology | Admitting: Radiation Oncology

## 2014-08-27 DIAGNOSIS — Z510 Encounter for antineoplastic radiation therapy: Secondary | ICD-10-CM | POA: Diagnosis not present

## 2014-08-30 ENCOUNTER — Ambulatory Visit
Admission: RE | Admit: 2014-08-30 | Discharge: 2014-08-30 | Disposition: A | Discharge status: Home / Self Care | Payer: Medicaid Other | Source: Ambulatory Visit | Attending: Radiation Oncology | Admitting: Radiation Oncology

## 2014-08-30 DIAGNOSIS — Z510 Encounter for antineoplastic radiation therapy: Secondary | ICD-10-CM | POA: Diagnosis not present

## 2014-08-31 ENCOUNTER — Ambulatory Visit
Admission: RE | Admit: 2014-08-31 | Discharge: 2014-08-31 | Disposition: A | Discharge status: Home / Self Care | Payer: Medicaid Other | Source: Ambulatory Visit | Attending: Radiation Oncology | Admitting: Radiation Oncology

## 2014-08-31 ENCOUNTER — Ambulatory Visit
Admission: RE | Admit: 2014-08-31 | Discharge: 2014-08-31 | Disposition: A | Discharge status: Home / Self Care | Payer: No Typology Code available for payment source | Source: Ambulatory Visit | Attending: Radiation Oncology | Admitting: Radiation Oncology

## 2014-08-31 VITALS — BP 115/75 | HR 102 | Temp 98.1°F | Wt 108.0 lb

## 2014-08-31 DIAGNOSIS — Z510 Encounter for antineoplastic radiation therapy: Secondary | ICD-10-CM | POA: Diagnosis not present

## 2014-08-31 DIAGNOSIS — C7931 Secondary malignant neoplasm of brain: Secondary | ICD-10-CM

## 2014-08-31 MED ORDER — DEXAMETHASONE 4 MG PO TABS: 4.0000 mg | ORAL_TABLET | Freq: Two times a day (BID) | ORAL | Status: DC

## 2014-08-31 NOTE — Progress Notes (Signed)
Weekly Management Note Current Dose: 17.5  Gy  Projected Dose: 30 Gy   Narrative:  The patient presents for routine under treatment assessment.  CBCT/MVCT images/Port film x-rays were reviewed.  The chart was checked. Doing well. Minor headaches. Pain in hip is better. Taking decadron at night.   Physical Findings: Weight: 108 lb (48.988 kg). Unchanged  Impression:  The patient is tolerating radiation.  Plan:  Continue treatment as planned. Discussed taking supplements which was difficult without an interpreter. She will continue decadron for now and I refilled this.

## 2014-08-31 NOTE — Progress Notes (Signed)
Weekly assessment of radiation to brain, right chest and left hip.Denies pain.Biggest concern is not able to sleep at night.Has been taking dexamethasone at 8 pm and 9 pm.Instructed to take with breakfast and last dose at 2 pm for twice daily.has only 2 days left on script.Thrush not clear, started on diflucan last week,Interpreter not available today but patient able to understand some english.

## 2014-09-01 ENCOUNTER — Ambulatory Visit
Admission: RE | Admit: 2014-09-01 | Discharge: 2014-09-01 | Disposition: A | Discharge status: Home / Self Care | Payer: Medicaid Other | Source: Ambulatory Visit | Attending: Radiation Oncology | Admitting: Radiation Oncology

## 2014-09-01 DIAGNOSIS — Z510 Encounter for antineoplastic radiation therapy: Secondary | ICD-10-CM | POA: Diagnosis not present

## 2014-09-01 NOTE — Addendum Note (Signed)
Encounter addended by: Derek Mound on: 09/01/2014  4:41 PM<BR>     Documentation filed: Notes Section

## 2014-09-01 NOTE — Progress Notes (Signed)
° °  Department of Radiation Oncology  Phone:  [phone] Fax:        [phone]   To Whom it May Concern:  Ms.Cumpian has been diagnosed with Stage IV lung cancer. She has brain metastases which are currently being treated with radiation. This is a terminal diagnosis. If you could expedite her disability process this would greatly appreciated. Please feel free to contact me with any questions or concerns at [phone].    ------------------------------------------------  Thea Silversmith, MD

## 2014-09-02 ENCOUNTER — Ambulatory Visit
Admission: RE | Admit: 2014-09-02 | Discharge: 2014-09-02 | Disposition: A | Discharge status: Home / Self Care | Payer: Medicaid Other | Source: Ambulatory Visit | Attending: Radiation Oncology | Admitting: Radiation Oncology

## 2014-09-02 DIAGNOSIS — Z510 Encounter for antineoplastic radiation therapy: Secondary | ICD-10-CM | POA: Diagnosis not present

## 2014-09-03 ENCOUNTER — Ambulatory Visit: Payer: Medicaid Other

## 2014-09-03 ENCOUNTER — Ambulatory Visit
Admission: RE | Admit: 2014-09-03 | Discharge: 2014-09-03 | Disposition: A | Discharge status: Home / Self Care | Payer: Medicaid Other | Source: Ambulatory Visit | Attending: Radiation Oncology | Admitting: Radiation Oncology

## 2014-09-03 DIAGNOSIS — Z510 Encounter for antineoplastic radiation therapy: Secondary | ICD-10-CM | POA: Diagnosis not present

## 2014-09-05 ENCOUNTER — Ambulatory Visit: Admission: RE | Admit: 2014-09-05 | Payer: Medicaid Other | Source: Ambulatory Visit

## 2014-09-06 ENCOUNTER — Ambulatory Visit
Admission: RE | Admit: 2014-09-06 | Discharge: 2014-09-06 | Disposition: A | Discharge status: Home / Self Care | Payer: Medicaid Other | Source: Ambulatory Visit | Attending: Radiation Oncology | Admitting: Radiation Oncology

## 2014-09-06 ENCOUNTER — Ambulatory Visit: Payer: Medicaid Other

## 2014-09-06 DIAGNOSIS — Z510 Encounter for antineoplastic radiation therapy: Secondary | ICD-10-CM | POA: Diagnosis not present

## 2014-09-06 LAB — FUNGUS CULTURE W SMEAR
FUNGAL SMEAR: NONE SEEN
Special Requests: NORMAL

## 2014-09-07 ENCOUNTER — Ambulatory Visit
Admission: RE | Admit: 2014-09-07 | Discharge: 2014-09-07 | Disposition: A | Discharge status: Home / Self Care | Payer: Medicaid Other | Source: Ambulatory Visit | Attending: Radiation Oncology | Admitting: Radiation Oncology

## 2014-09-07 ENCOUNTER — Ambulatory Visit
Admission: RE | Admit: 2014-09-07 | Discharge: 2014-09-07 | Disposition: A | Discharge status: Home / Self Care | Payer: No Typology Code available for payment source | Source: Ambulatory Visit | Attending: Radiation Oncology | Admitting: Radiation Oncology

## 2014-09-07 VITALS — BP 103/66 | HR 78 | Temp 98.0°F | Wt 105.6 lb

## 2014-09-07 DIAGNOSIS — Z510 Encounter for antineoplastic radiation therapy: Secondary | ICD-10-CM | POA: Diagnosis not present

## 2014-09-07 DIAGNOSIS — C3491 Malignant neoplasm of unspecified part of right bronchus or lung: Secondary | ICD-10-CM

## 2014-09-07 NOTE — Progress Notes (Signed)
  Radiation Oncology         [phone]) [phone] ________________________________  Name: Deborah Foster MRN: [id number]  Date: 09/07/2014  DOB: 24-Oct-1970  End of Treatment Note  Diagnosis:   Non-small cell carcinoma of lung, stage 4   Staging form: Lung, AJCC 7th Edition     Clinical stage from 08/26/2014: Stage IV (T2a, N1, M1b) - Signed by [name], MD on 08/26/2014  Indication for treatment:  Palliation       Radiation treatment dates:   08/23/14-09/07/14  Site/dose:   Brain, right chest and left hip treated to 30 Gy in 12 fractions.   Beams/energy:   Brain was treated with opposed laterals and reduced fields, chest and hip were treated AP/PA. Mixed energies were used with 6, 10 and 15 MV photons.   Narrative: The patient tolerated radiation treatment relatively well.   Her hip pain was better at the end of treatment. She requested a referral to physical therapy which was made. I placed her on a steroid taper.   Plan: The patient has completed radiation treatment. The patient will return to radiation oncology clinic for routine followup in one month. I advised them to call or return sooner if they have any questions or concerns related to their recovery or treatment.  ------------------------------------------------  Thea Silversmith, MD

## 2014-09-07 NOTE — Progress Notes (Signed)
BP 103/66 mmHg  Pulse 78  Temp(Src) 98 F (36.7 C)  Wt 105 lb 9.6 oz (47.9 kg)  LMP 08/12/2014  Completed 12 treatments to brain, left hip and right chest.Has developed itching rash on chest.told to apply radiaplex in all treatment fields that are red or irritated.Has developed hair loss.Will need steroid taper.Currently takes dexamethasone 4 mg bid.Patient is having increased pain of left hip and leg on bending.

## 2014-09-07 NOTE — Patient Instructions (Signed)
Dexamethasone (Decadron) Take 4 mg (1 pill) in AM then 2 mg (1/2 pill) in PM x 3 days Take 4 mg (1 pill) in AM x 3 days Take 2 mg (1/2 pill) in AM x 3 days then STOP  If you start having headaches, take 3 pills at that time then start back on 1 pill 2 times a day and call us at [phone].

## 2014-09-09 ENCOUNTER — Encounter: Payer: Self-pay | Admitting: Internal Medicine

## 2014-09-09 ENCOUNTER — Ambulatory Visit (HOSPITAL_COMMUNITY)
Admission: RE | Admit: 2014-09-09 | Discharge: 2014-09-09 | Disposition: A | Discharge status: Home / Self Care | Payer: No Typology Code available for payment source | Source: Ambulatory Visit | Attending: Internal Medicine | Admitting: Internal Medicine

## 2014-09-09 ENCOUNTER — Encounter: Payer: Self-pay | Admitting: Nurse Practitioner

## 2014-09-09 ENCOUNTER — Telehealth: Payer: Self-pay | Admitting: Internal Medicine

## 2014-09-09 ENCOUNTER — Ambulatory Visit (HOSPITAL_BASED_OUTPATIENT_CLINIC_OR_DEPARTMENT_OTHER): Payer: No Typology Code available for payment source | Admitting: Nurse Practitioner

## 2014-09-09 ENCOUNTER — Other Ambulatory Visit (HOSPITAL_BASED_OUTPATIENT_CLINIC_OR_DEPARTMENT_OTHER): Payer: No Typology Code available for payment source

## 2014-09-09 VITALS — BP 101/55 | HR 75 | Temp 97.6°F | Resp 18 | Ht 59.0 in | Wt 103.0 lb

## 2014-09-09 DIAGNOSIS — E889 Metabolic disorder, unspecified: Secondary | ICD-10-CM

## 2014-09-09 DIAGNOSIS — M898X9 Other specified disorders of bone, unspecified site: Secondary | ICD-10-CM | POA: Diagnosis present

## 2014-09-09 DIAGNOSIS — C3491 Malignant neoplasm of unspecified part of right bronchus or lung: Secondary | ICD-10-CM | POA: Insufficient documentation

## 2014-09-09 DIAGNOSIS — C7972 Secondary malignant neoplasm of left adrenal gland: Secondary | ICD-10-CM | POA: Insufficient documentation

## 2014-09-09 DIAGNOSIS — C7951 Secondary malignant neoplasm of bone: Secondary | ICD-10-CM

## 2014-09-09 DIAGNOSIS — C7970 Secondary malignant neoplasm of unspecified adrenal gland: Secondary | ICD-10-CM | POA: Diagnosis not present

## 2014-09-09 DIAGNOSIS — M9080 Osteopathy in diseases classified elsewhere, unspecified site: Secondary | ICD-10-CM

## 2014-09-09 DIAGNOSIS — C342 Malignant neoplasm of middle lobe, bronchus or lung: Secondary | ICD-10-CM

## 2014-09-09 DIAGNOSIS — C7931 Secondary malignant neoplasm of brain: Secondary | ICD-10-CM

## 2014-09-09 LAB — COMPREHENSIVE METABOLIC PANEL (CC13)
ALBUMIN: 3.1 g/dL — AB (ref 3.5–5.0)
ALK PHOS: 223 U/L — AB (ref 40–150)
ALT: 25 U/L (ref 0–55)
AST: 15 U/L (ref 5–34)
Anion Gap: 10 mEq/L (ref 3–11)
BUN: 15.4 mg/dL (ref 7.0–26.0)
CALCIUM: 8.5 mg/dL (ref 8.4–10.4)
CO2: 23 mEq/L (ref 22–29)
CREATININE: 0.6 mg/dL (ref 0.6–1.1)
Chloride: 107 mEq/L (ref 98–109)
EGFR: >90 mL/min/{1.73_m2} (ref >90)
GLUCOSE: 69 mg/dL — AB (ref 70–140)
Potassium: 3.3 mEq/L — ABNORMAL LOW (ref 3.5–5.1)
Sodium: 140 mEq/L (ref 136–145)
Total Bilirubin: 0.94 mg/dL (ref 0.20–1.20)
Total Protein: 6 g/dL — ABNORMAL LOW (ref 6.4–8.3)

## 2014-09-09 LAB — CBC WITH DIFFERENTIAL/PLATELET
BASO%: 0.1 % (ref 0.0–2.0)
Basophils Absolute: 0 10*3/uL (ref 0.0–0.1)
EOS%: 0.7 % (ref 0.0–7.0)
Eosinophils Absolute: 0 10*3/uL (ref 0.0–0.5)
HEMATOCRIT: 41.6 % (ref 34.8–46.6)
HEMOGLOBIN: 13.9 g/dL (ref 11.6–15.9)
LYMPH#: 0.6 10*3/uL — AB (ref 0.9–3.3)
LYMPH%: 8.2 % — ABNORMAL LOW (ref 14.0–49.7)
MCH: 30.4 pg (ref 25.1–34.0)
MCHC: 33.4 g/dL (ref 31.5–36.0)
MCV: 91.1 fL (ref 79.5–101.0)
MONO#: 0.9 10*3/uL (ref 0.1–0.9)
MONO%: 13 % (ref 0.0–14.0)
NEUT%: 78 % — ABNORMAL HIGH (ref 38.4–76.8)
NEUTROS ABS: 5.3 10*3/uL (ref 1.5–6.5)
Platelets: 290 10*3/uL (ref 145–400)
RBC: 4.57 10*6/uL (ref 3.70–5.45)
RDW: 15.4 % — ABNORMAL HIGH (ref 11.2–14.5)
WBC: 6.8 10*3/uL (ref 3.9–10.3)

## 2014-09-09 LAB — GLUCOSE, CAPILLARY: Glucose-Capillary: 69 mg/dL (ref 65–99)

## 2014-09-09 LAB — TECHNOLOGIST REVIEW

## 2014-09-09 MED ORDER — ERLOTINIB HCL 150 MG PO TABS: 150.0000 mg | ORAL_TABLET | Freq: Every day | ORAL | Status: DC

## 2014-09-09 MED ORDER — FLUDEOXYGLUCOSE F - 18 (FDG) INJECTION
6.2800 | Freq: Once | INTRAVENOUS | Status: DC | PRN
  Administered 2014-09-09: 6.28 via INTRAVENOUS
  Filled 2014-09-09: qty 6.28

## 2014-09-09 NOTE — Progress Notes (Signed)
[place] [phone]   [phone]  OFFICE PROGRESS NOTE  [name], MD [place] [address]  DIAGNOSIS: Stage IV (T2a, N1, M1 B) non-small cell lung cancer, adenocarcinoma presented with right middle lobe lung mass in addition to right hilar adenopathy as well as metastatic disease to the bone, brain as well as left adrenal metastasis diagnosed in July 2016  PRIOR THERAPY: The patient completed palliative radiotherapy to the brain as well as metastatic bone lesions in the pelvis on 09/07/14  CURRENT THERAPY: None.  INTERVAL HISTORY: Deborah Foster 44 y.o. female returns to the clinic today for hospital follow-up visit accompanied by her interpreter. The patient was recently diagnosed with metastatic non-small cell lung cancer, adenocarcinoma. She has recently completed palliative radiation therapy to her brain, chest, and pelvis. She tolerated this well, but has noticed some hair thinning. She is on a steroid taper that is causing her cheeks to flush and some difficulty sleeping at night. She had a PET scan earlier this morning, but the results are not yet available. She is here for the results of her genetic study, and to discuss treatment options. The patient denies fevers, chills, nausea, vomiting, or changes in bowel or bladder habits. She has no shortness of breath, chest pain, cough, hemoptysis, or palpitations. She denies headaches, dizziness, or vision changes. She does have left leg numbness and weakness.   MEDICAL HISTORY: Past Medical History  Diagnosis Date  . Cancer   . Pneumonia   . Hemoptysis   . Adrenal cancer   . Lung mass   . Hypokalemia     ALLERGIES:  has No Known Allergies.  MEDICATIONS:  Current Outpatient Prescriptions  Medication Sig Dispense Refill  . dexamethasone (DECADRON) 4 MG tablet Take 1 tablet (4 mg total) by mouth 2 (two) times daily. 60 tablet 0  . albuterol (PROVENTIL  HFA;VENTOLIN HFA) 108 (90 BASE) MCG/ACT inhaler Inhale 2 puffs into the lungs every 6 (six) hours as needed for wheezing or shortness of breath. (Patient not taking: Reported on 08/31/2014) 1 Inhaler 2  . erlotinib (TARCEVA) 150 MG tablet Take 1 tablet (150 mg total) by mouth daily. Take on an empty stomach 1 hour before meals or 2 hours after. 30 tablet 2  . fluconazole (DIFLUCAN) 100 MG tablet Take 1 tablet (100 mg total) by mouth daily. (Patient not taking: Reported on 09/09/2014) 8 tablet 0  . HYDROcodone-acetaminophen (NORCO) 10-325 MG per tablet Take 1 tablet by mouth every 6 (six) hours as needed for moderate pain or severe pain. (Patient not taking: Reported on 08/31/2014) 30 tablet 0  . Misc Natural Products (TURMERIC CURCUMIN) CAPS Take 750 capsules by mouth.    . morphine (MSIR) 30 MG tablet     . ondansetron (ZOFRAN) 4 MG tablet Take 1 tablet (4 mg total) by mouth every 8 (eight) hours as needed for nausea or vomiting. (Patient not taking: Reported on 09/07/2014) 30 tablet 0  . UNABLE TO FIND Take 650 mg by mouth. Med Name:corduceps sinesis     No current facility-administered medications for this visit.   Facility-Administered Medications Ordered in Other Visits  Medication Dose Route Frequency Provider Last Rate Last Dose  . fludeoxyglucose F - 18 (FDG) injection 6.28 milli Curie  6.28 milli Curie Intravenous Once PRN Medication Radiologist, MD   6.28 milli Curie at 09/09/14 0720    SURGICAL HISTORY:  Past Surgical History  Procedure Laterality Date  .  Video bronchoscopy Bilateral 08/09/2014    Procedure: VIDEO BRONCHOSCOPY WITHOUT FLUORO;  Surgeon: [name], MD;  Location: [facility];  Service: Cardiopulmonary;  Laterality: Bilateral;    REVIEW OF SYSTEMS:  Constitutional: positive for fatigue Eyes: negative Ears, nose, mouth, throat, and face: negative Respiratory: negative Cardiovascular: negative Gastrointestinal:  negative Genitourinary:negative Integument/breast: negative Hematologic/lymphatic: negative Musculoskeletal:positive for bone pain Neurological: negative Behavioral/Psych: negative Endocrine: negative Allergic/Immunologic: negative   PHYSICAL EXAMINATION: General appearance: alert, cooperative, fatigued and no distress Head: Normocephalic, without obvious abnormality, atraumatic Neck: no adenopathy, no JVD, supple, symmetrical, trachea midline and thyroid not enlarged, symmetric, no tenderness/mass/nodules Lymph nodes: Cervical, supraclavicular, and axillary nodes normal. Resp: clear to auscultation bilaterally Back: symmetric, no curvature. ROM normal. No CVA tenderness. Cardio: regular rate and rhythm, S1, S2 normal, no murmur, click, rub or gallop GI: soft, non-tender; bowel sounds normal; no masses,  no organomegaly Extremities: extremities normal, atraumatic, no cyanosis or edema Neurologic: Alert and oriented X 3, normal strength and tone. Normal symmetric reflexes. Normal coordination and gait  ECOG PERFORMANCE STATUS: 1 - Symptomatic but completely ambulatory  Blood pressure 101/55, pulse 75, temperature 97.6 F (36.4 C), temperature source Oral, resp. rate 18, height _0  (1.499 m), weight 103 lb (46.72 kg), last menstrual period 08/12/2014, SpO2 100 %.  LABORATORY DATA: Lab Results  Component Value Date   WBC 6.8 09/09/2014   HGB 13.9 09/09/2014   HCT 41.6 09/09/2014   MCV 91.1 09/09/2014   PLT 290 09/09/2014      Chemistry      Component Value Date/Time   NA 140 09/09/2014 0859   NA 135 08/17/2014 0553   K 3.3* 09/09/2014 0859   K 3.2* 08/17/2014 0553   CL 96* 08/17/2014 0553   CO2 23 09/09/2014 0859   CO2 26 08/17/2014 0553   BUN 15.4 09/09/2014 0859   BUN <5* 08/17/2014 0553   CREATININE 0.6 09/09/2014 0859   CREATININE 0.41* 08/17/2014 0553      Component Value Date/Time   CALCIUM 8.5 09/09/2014 0859   CALCIUM 8.4* 08/17/2014 0553   ALKPHOS 223*  09/09/2014 0859   ALKPHOS 145* 08/08/2014 0458   AST 15 09/09/2014 0859   AST 16 08/08/2014 0458   ALT 25 09/09/2014 0859   ALT 14 08/08/2014 0458   BILITOT 0.94 09/09/2014 0859   BILITOT 0.5 08/08/2014 0458       RADIOGRAPHIC STUDIES: Dg Chest 2 View  08/24/2014   CLINICAL DATA:  Followup metastatic lung cancer, adrenal mass.  EXAM: CHEST  2 VIEW  COMPARISON:  Chest radiograph 08/13/2014 and CT chest 08/07/2014.  FINDINGS: Trachea is midline. Heart size normal. Right lower lobe mass is again seen with left surrounding opacification. Left lung is grossly clear. No definite pleural fluid.  IMPRESSION: Right lower lobe mass with less surrounding opacification possibly due to improving postobstructive pneumonitis and/or pulmonary hemorrhage.   Electronically Signed   By: Lorin Picket M.D.   On: 08/24/2014 13:48   Dg Chest 2 View  08/13/2014   CLINICAL DATA:  44 year old female with shortness of breath and hemoptysis for almost 1 week. Known right lower lobe mass. Status post recent right bronchial artery embolization on 08/10/2014.  EXAM: CHEST  2 VIEW  COMPARISON:  Recent prior chest x-ray 08/11/2014  FINDINGS: Masslike opacity in the right lower lobe again demonstrated without significant interval change. Ill-defined airspace opacity in the periphery of the lesion likely consistent with a combination of postobstructive changes and possibly alveolar hemorrhage. The left  lung remains relatively clear. The cardiac and mediastinal contours are unchanged. No acute osseous abnormality. Overall, very similar appearance of the chest. No new pneumothorax or pleural effusion.  IMPRESSION: Similar appearance of known right lower lobe pulmonary mass and surrounding airspace opacity likely reflecting a combination of postobstructive change and alveolar hemorrhage.  No significant interval change.   Electronically Signed   By: Jacqulynn Cadet M.D.   On: 08/13/2014 08:04   Mr Jeri Cos HW Contrast  08/16/2014    CLINICAL DATA:  Newly diagnosed metastatic lung cancer.  EXAM: MRI HEAD WITHOUT AND WITH CONTRAST  TECHNIQUE: Multiplanar, multiecho pulse sequences of the brain and surrounding structures were obtained without and with intravenous contrast.  CONTRAST:  15m MULTIHANCE GADOBENATE DIMEGLUMINE 529 MG/ML IV SOLN  COMPARISON:  MRI of the cervical spine January 29, 2014  FINDINGS: At least 3 cerebellar and 10 supratentorial enhancing metastasis seen, many of which show susceptibility artifact compatible with hemorrhage, a few demonstrate intrinsic T1 shortening consistent with subacute blood products.  Lesions include:  Infratentorial: Less than 5 mm RIGHT, less than 5 mm LEFT, 9 x 7 mm LEFT cerebellum.  Supratentorial: 36 x 24 mm LEFT temporal lobe, 5 x 8 mm RIGHT occipital lobe, 12 x 9 mm RIGHT occipital lobe, 19 x 22 mm LEFT frontal lobe, 17 x 12 mm RIGHT frontal lobe, 12 x 13 mm RIGHT parietal lobe in addition to less than 5 mm RIGHT frontal superior gyrus, RIGHT posterior frontal lobe, LEFT temporal parietal, LEFT temporal lobe lesions.  All lesions demonstrate T2 bright vasogenic edema and local mass effect. 9 mm LEFT-to-RIGHT midline shift, with partially effaced LEFT lateral ventricle, and mild entrapment of LEFT temporal horn. Mild LEFT uncal herniation and effacement of the LEFT quadrigeminal and ambient cistern. No reduced diffusion to suggest acute ischemia.  No abnormal extra-axial fluid collections, extra all axial enhancement or extra-axial masses. Normal major intracranial vascular flow voids seen at the skull base.  Ocular globes and orbital contents unremarkable. Paranasal sinuses and mastoid air cells are well aerated. Subcentimeter probable hemangiomas in the calvarium though fat saturated post gadolinium sequences would be more sensitive.  IMPRESSION: At least 3 cerebellar and 10 supratentorial intraparenchymal metastasis, largest in LEFT temporal lobe measuring 3.6 x 2.4 cm, resulting in local mass  effect including uncal herniation and effaced LEFT ambient/quadrigeminal cistern.  9 mm LEFT-to-RIGHT midline shift. Mild/early LEFT temporal horn entrapment.   Electronically Signed   By: CElon AlasM.D.   On: 08/16/2014 23:52   Ct Abdomen Pelvis W Contrast  08/11/2014   CLINICAL DATA:  Lung cancer. Recent diagnosis of right lower lobe mass with left adrenal nodule and bony metastatic disease.  EXAM: CT ABDOMEN AND PELVIS WITH CONTRAST  TECHNIQUE: Multidetector CT imaging of the abdomen and pelvis was performed using the standard protocol following bolus administration of intravenous contrast.  CONTRAST:  865mOMNIPAQUE IOHEXOL 300 MG/ML  SOLN  COMPARISON:  Chest CT 08/07/2014  FINDINGS: Lower chest: Right lower lobe mass is partially included, there is increased adjacent ground-glass opacity and consolidation, likely related to hemorrhage. No definite right pleural effusion. Minimal atelectasis in the left lower lobe with tiny subpulmonic left pleural effusion.  Hepatobiliary: Intra and extrahepatic biliary ductal dilatation, the common bile duct measures up to 1.7 cm proximally with tapering at the duodenum all insertion. Mild central intra and extrahepatic biliary ductal dilatation. The gallbladder is physiologically distended. Punctate, less than 2 mm hypodensity in the subcapsular right lobe of the liver.  There is otherwise no focal hepatic lesion or signs of hepatic metastatic disease.  Pancreas: Normal, no pancreatic ductal dilatation. No focal pancreatic lesion, particularly at the pancreatic head.  Spleen: Normal in size without focal lesion.  Adrenals/Urinary Tract: Heterogeneous left adrenal mass measuring 5.2 x 2.5 cm with central low density component concerning for necrosis. The right adrenal gland appears normal. Kidneys demonstrate symmetric enhancement and excretion. No hydronephrosis. Mm cortical hypodensity in the lower right kidney, too small to characterize.  Stomach/Bowel: Stomach is  physiologically distended. There are no dilated or thickened bowel loops. The appendix is normal. Small to moderate volume of colonic stool without colonic wall thickening.  Vascular/Lymphatic: Small retroperitoneal lymph nodes all measuring less than 3 mm in short axis dimension. No pathologic retroperitoneal, mesenteric, or inguinal adenopathy. The abdominal aorta is normal in caliber.  Reproductive: The uterus is normal for age. There is is 3 cm left ovarian cyst. Physiologic follicles in the right ovary. No adnexal mass.  Bladder:  Physiologically distended.  Other: No ascites.  Musculoskeletal: Osseous metastatic disease involving the left acetabulum measuring 4.0 x 3.7 cm. There is destruction of the medial cortex with soft tissue component and involvement of the superior articular surface. Cortical thinning laterally. Additional multifocal lytic lesions in the left ischium. Lytic lesion in the left femoral neck without definite cortical thinning. Sclerotic density in the right femoral neck. Additional small lucent lesions throughout the bony pelvis. Questionable small lytic lesions in L1 and L2 vertebral bodies.  IMPRESSION: 1. Left adrenal mass measures 5.2 cm with central low density concerning for necrotic metastasis. 2. Osseous metastatic disease. Large destructive lesion involving the left acetabulum with soft tissue component medial and involvement of the superior articular surface. Additional osseous metastasis in the left femoral neck, throughout the bony pelvis, and likely within L1 and L2 vertebral bodies. 3. Intra and extrahepatic biliary ductal dilatation without definable cause. No gallbladder distention or pancreatic ductal dilatation. No obstructing pancreatic lesion. 4. Tiny 2 mm subcapsular hypodensity in the right lobe of the liver, too small to characterize. Small 4 mm hypodensity in the lower right kidney, also too small to characterize.   Electronically Signed   By: Jeb Levering M.D.    On: 08/11/2014 21:45   Ir Aorta/thoracic  08/10/2014   INDICATION: No known malignancy, now with large (approximately 4.5 x 3.0 cm) spiculated mass within the superior segment of the right lower lobe. The page underwent attempted bronchoscopic biopsy, however the biopsy could not be performed secondary to active bleeding into the airway. As such, the patient was intubated for airway protection purposes.  The patient was observed uneventfully overnight in the ICU, though repeat bronchoscopy performed this morning demonstrated persistent bleeding from this infiltrative right lower lobe mass.  As such, request made for bronchial artery embolization in hopes of avoiding potential life threatening hemoptysis.  EXAM: 1. ULTRASOUND GUIDANCE FOR ARTERIAL ACCESS 2. SELECTIVE RIGHT BRONCHIAL ARTERIOGRAM (THIRD ORDER) AND FLUOROSCOPIC GUIDED PERCUTANEOUS PARTICLE EMBOLIZATION.  COMPARISON:  Chest CTA - 08/07/2014  MEDICATIONS: Fentanyl 100 mcg IV; Versed at 3 mg IV  Sedation time: 30 minutes  CONTRAST:  69m OMNIPAQUE IOHEXOL 300 MG/ML  SOLN  FLUOROSCOPY TIME:  6 minutes 36 seconds (3371mGy)  COMPLICATIONS: None immediate  TECHNIQUE: Informed written consent was obtained from the patient's family after a discussion of the risks, benefits and alternatives to treatment. Questions regarding the procedure were encouraged and answered. A timeout was performed prior to the initiation of the procedure.  The  right groin was prepped and draped in the usual sterile fashion, and a sterile drape was applied covering the operative field. Maximum barrier sterile technique with sterile gowns and gloves were used for the procedure. A timeout was performed prior to the initiation of the procedure.  The right femoral head was marked fluoroscopically. Under ultrasound guidance, the right common femoral artery was accessed with a micropuncture kit after the overlying soft tissues were anesthetized with 1% lidocaine. An ultrasound image was  saved for documentation purposes. The micropuncture sheath was exchanged for a 5 [place] vascular sheath over a Bentson wire. A closure arteriogram was performed through the side of the sheath confirming access within the right common femoral artery.  Over a Bentson wire, a Mickelson catheter was advanced to the level of the thoracic aorta where it was back bled and flushed. The catheter was then utilized to select the hypertrophied right bronchial artery. A selective right bronchial arteriogram was performed.  With the use of a 0.014 fathom wire, a high-flow Renegade micro catheter was advanced into the distal aspect of the right bronchial artery supplying the superior segment of the right lower lobe. Repeat sub selective arteriogram was performed.  The distal aspect of the inferior segment of the right bronchial artery was then subsequently percutaneously particle embolized with approximately 3/4 of 1 vial of 300-500 micron Embospheres. The micro catheter was retracted into the caudal aspect of the right bronchial artery and a post embolization arteriogram was performed.  Images reviewed and the procedure was terminated. All wires catheters and sheaths were removed from the patient. Hemostasis was achieved at the right groin access site with deployment of an Exoseal closure device. A dressing was placed. The patient tolerated the procedure well without immediate postprocedural complication.  FINDINGS: Selective right bronchial arteriogram demonstrates marked hypertrophy of this vessel as demonstrated on preceding chest CTA.  The caudal tributaries of the right bronchial artery were noted to supply an ill-defined area of hyperemia within the superior medial aspect of the right lower lobe correlating with the spiculated mass seen on preceding chest CT. Following percutaneous selective particle embolization, there is near absence of flow into these abnormal distal vessels supplying the known spiculated mass.   IMPRESSION: Technically successful right bronchial artery percutaneous particle embolization for active bleeding from the known infiltrative mass within the superior segment of the right lower lobe.   Electronically Signed   By: Sandi Mariscal M.D.   On: 08/10/2014 18:18   Ir Angiogram Selective Each Additional Vessel  08/10/2014   INDICATION: No known malignancy, now with large (approximately 4.5 x 3.0 cm) spiculated mass within the superior segment of the right lower lobe. The page underwent attempted bronchoscopic biopsy, however the biopsy could not be performed secondary to active bleeding into the airway. As such, the patient was intubated for airway protection purposes.  The patient was observed uneventfully overnight in the ICU, though repeat bronchoscopy performed this morning demonstrated persistent bleeding from this infiltrative right lower lobe mass.  As such, request made for bronchial artery embolization in hopes of avoiding potential life threatening hemoptysis.  EXAM: 1. ULTRASOUND GUIDANCE FOR ARTERIAL ACCESS 2. SELECTIVE RIGHT BRONCHIAL ARTERIOGRAM (THIRD ORDER) AND FLUOROSCOPIC GUIDED PERCUTANEOUS PARTICLE EMBOLIZATION.  COMPARISON:  Chest CTA - 08/07/2014  MEDICATIONS: Fentanyl 100 mcg IV; Versed at 3 mg IV  Sedation time: 30 minutes  CONTRAST:  92m OMNIPAQUE IOHEXOL 300 MG/ML  SOLN  FLUOROSCOPY TIME:  6 minutes 36 seconds (3474mGy)  COMPLICATIONS: None immediate  TECHNIQUE: Informed written consent was obtained from the patient's family after a discussion of the risks, benefits and alternatives to treatment. Questions regarding the procedure were encouraged and answered. A timeout was performed prior to the initiation of the procedure.  The right groin was prepped and draped in the usual sterile fashion, and a sterile drape was applied covering the operative field. Maximum barrier sterile technique with sterile gowns and gloves were used for the procedure. A timeout was performed prior to the  initiation of the procedure.  The right femoral head was marked fluoroscopically. Under ultrasound guidance, the right common femoral artery was accessed with a micropuncture kit after the overlying soft tissues were anesthetized with 1% lidocaine. An ultrasound image was saved for documentation purposes. The micropuncture sheath was exchanged for a 5 [place] vascular sheath over a Bentson wire. A closure arteriogram was performed through the side of the sheath confirming access within the right common femoral artery.  Over a Bentson wire, a Mickelson catheter was advanced to the level of the thoracic aorta where it was back bled and flushed. The catheter was then utilized to select the hypertrophied right bronchial artery. A selective right bronchial arteriogram was performed.  With the use of a 0.014 fathom wire, a high-flow Renegade micro catheter was advanced into the distal aspect of the right bronchial artery supplying the superior segment of the right lower lobe. Repeat sub selective arteriogram was performed.  The distal aspect of the inferior segment of the right bronchial artery was then subsequently percutaneously particle embolized with approximately 3/4 of 1 vial of 300-500 micron Embospheres. The micro catheter was retracted into the caudal aspect of the right bronchial artery and a post embolization arteriogram was performed.  Images reviewed and the procedure was terminated. All wires catheters and sheaths were removed from the patient. Hemostasis was achieved at the right groin access site with deployment of an Exoseal closure device. A dressing was placed. The patient tolerated the procedure well without immediate postprocedural complication.  FINDINGS: Selective right bronchial arteriogram demonstrates marked hypertrophy of this vessel as demonstrated on preceding chest CTA.  The caudal tributaries of the right bronchial artery were noted to supply an ill-defined area of hyperemia within the  superior medial aspect of the right lower lobe correlating with the spiculated mass seen on preceding chest CT. Following percutaneous selective particle embolization, there is near absence of flow into these abnormal distal vessels supplying the known spiculated mass.  IMPRESSION: Technically successful right bronchial artery percutaneous particle embolization for active bleeding from the known infiltrative mass within the superior segment of the right lower lobe.   Electronically Signed   By: Sandi Mariscal M.D.   On: 08/10/2014 18:18   Ir Angiogram Follow Up Study  08/10/2014   INDICATION: No known malignancy, now with large (approximately 4.5 x 3.0 cm) spiculated mass within the superior segment of the right lower lobe. The page underwent attempted bronchoscopic biopsy, however the biopsy could not be performed secondary to active bleeding into the airway. As such, the patient was intubated for airway protection purposes.  The patient was observed uneventfully overnight in the ICU, though repeat bronchoscopy performed this morning demonstrated persistent bleeding from this infiltrative right lower lobe mass.  As such, request made for bronchial artery embolization in hopes of avoiding potential life threatening hemoptysis.  EXAM: 1. ULTRASOUND GUIDANCE FOR ARTERIAL ACCESS 2. SELECTIVE RIGHT BRONCHIAL ARTERIOGRAM (THIRD ORDER) AND FLUOROSCOPIC GUIDED PERCUTANEOUS PARTICLE EMBOLIZATION.  COMPARISON:  Chest CTA - 08/07/2014  MEDICATIONS: Fentanyl 100 mcg IV; Versed at 3 mg IV  Sedation time: 30 minutes  CONTRAST:  45m OMNIPAQUE IOHEXOL 300 MG/ML  SOLN  FLUOROSCOPY TIME:  6 minutes 36 seconds (3497mGy)  COMPLICATIONS: None immediate  TECHNIQUE: Informed written consent was obtained from the patient's family after a discussion of the risks, benefits and alternatives to treatment. Questions regarding the procedure were encouraged and answered. A timeout was performed prior to the initiation of the procedure.  The  right groin was prepped and draped in the usual sterile fashion, and a sterile drape was applied covering the operative field. Maximum barrier sterile technique with sterile gowns and gloves were used for the procedure. A timeout was performed prior to the initiation of the procedure.  The right femoral head was marked fluoroscopically. Under ultrasound guidance, the right common femoral artery was accessed with a micropuncture kit after the overlying soft tissues were anesthetized with 1% lidocaine. An ultrasound image was saved for documentation purposes. The micropuncture sheath was exchanged for a 5 [place] sheath over a Bentson wire. A closure arteriogram was performed through the side of the sheath confirming access within the right common femoral artery.  Over a Bentson wire, a Mickelson catheter was advanced to the level of the thoracic aorta where it was back bled and flushed. The catheter was then utilized to select the hypertrophied right bronchial artery. A selective right bronchial arteriogram was performed.  With the use of a 0.014 fathom wire, a high-flow Renegade micro catheter was advanced into the distal aspect of the right bronchial artery supplying the superior segment of the right lower lobe. Repeat sub selective arteriogram was performed.  The distal aspect of the inferior segment of the right bronchial artery was then subsequently percutaneously particle embolized with approximately 3/4 of 1 vial of 300-500 micron Embospheres. The micro catheter was retracted into the caudal aspect of the right bronchial artery and a post embolization arteriogram was performed.  Images reviewed and the procedure was terminated. All wires catheters and sheaths were removed from the patient. Hemostasis was achieved at the right groin access site with deployment of an Exoseal closure device. A dressing was placed. The patient tolerated the procedure well without immediate postprocedural complication.   FINDINGS: Selective right bronchial arteriogram demonstrates marked hypertrophy of this vessel as demonstrated on preceding chest CTA.  The caudal tributaries of the right bronchial artery were noted to supply an ill-defined area of hyperemia within the superior medial aspect of the right lower lobe correlating with the spiculated mass seen on preceding chest CT. Following percutaneous selective particle embolization, there is near absence of flow into these abnormal distal vessels supplying the known spiculated mass.  IMPRESSION: Technically successful right bronchial artery percutaneous particle embolization for active bleeding from the known infiltrative mass within the superior segment of the right lower lobe.   Electronically Signed   By: JSandi MariscalM.D.   On: 08/10/2014 18:18   Nm Pet Image Initial (pi) Skull Base To Thigh  09/09/2014   CLINICAL DATA:  Initial treatment strategy for lung carcinoma. Non-small cell lung cancer  EXAM: NUCLEAR MEDICINE PET SKULL BASE TO THIGH  TECHNIQUE: 6.3 mCi F-18 FDG was injected intravenously. Full-ring PET imaging was performed from the skull base to thigh after the radiotracer. CT data was obtained and used for attenuation correction and anatomic localization.  FASTING BLOOD GLUCOSE:  Value: 69 mg/dl  COMPARISON:  CT 08/07/2014  FINDINGS: NECK  Subtle hypermetabolic activity in the LEFT  temporal lobe corresponds to large metastasis on comparison brain MRI.  CHEST  Hypermetabolic nodule in the RIGHT infrahilar lung adjacent to the bronchus intermedius with SUV max equal 11.7. There is a hypermetabolic mass in the RIGHT lower lobe measuring 3.5 cm with SUV max equal 15.3. There is peripheral postobstructive pneumonitis in the RIGHT lower lobe.  Hypermetabolic RIGHT lower paratracheal lymph node on image 65.  ABDOMEN/PELVIS  Enlargement of the LEFT adrenal gland to 5 cm with SUV max equal 16.8.  No abnormal metabolic activity in the liver. No hypermetabolic abdominal or  pelvic lymph nodes.  SKELETON  Widespread lytic hypermetabolic skeletal metastasis. Large lesion in the LEFT acetabulum extending into the LEFT inferior pubic ramus with expansile soft tissue component and intense metabolic activity (SUV max 11.6). Additional smaller lesions are present within sacrum and lumbar spine (L1-L2). Additional skeletal metastasis within the RIGHT humeral head and manubrium. The medial clavicular heads are also involved.  IMPRESSION: 1. Hypermetabolic RIGHT lower lobe mass and RIGHT infrahilar nodule consists with primary bronchogenic carcinoma. 2. Local hypermetabolic nodal metastasis to the ipsilateral mediastinum. 3. Distant soft tissue metastasis to the LEFT adrenal gland and brain. 4. Skeletal metastasis involving the pelvis, sacrum, lumbar spine, RIGHT humerus, and manubrium.   Electronically Signed   By: Suzy Bouchard M.D.   On: 09/09/2014 09:29   Ir US Guide Vasc Access Right  08/10/2014   INDICATION: No known malignancy, now with large (approximately 4.5 x 3.0 cm) spiculated mass within the superior segment of the right lower lobe. The page underwent attempted bronchoscopic biopsy, however the biopsy could not be performed secondary to active bleeding into the airway. As such, the patient was intubated for airway protection purposes.  The patient was observed uneventfully overnight in the ICU, though repeat bronchoscopy performed this morning demonstrated persistent bleeding from this infiltrative right lower lobe mass.  As such, request made for bronchial artery embolization in hopes of avoiding potential life threatening hemoptysis.  EXAM: 1. ULTRASOUND GUIDANCE FOR ARTERIAL ACCESS 2. SELECTIVE RIGHT BRONCHIAL ARTERIOGRAM (THIRD ORDER) AND FLUOROSCOPIC GUIDED PERCUTANEOUS PARTICLE EMBOLIZATION.  COMPARISON:  Chest CTA - 08/07/2014  MEDICATIONS: Fentanyl 100 mcg IV; Versed at 3 mg IV  Sedation time: 30 minutes  CONTRAST:  70m OMNIPAQUE IOHEXOL 300 MG/ML  SOLN  FLUOROSCOPY  TIME:  6 minutes 36 seconds (3320mGy)  COMPLICATIONS: None immediate  TECHNIQUE: Informed written consent was obtained from the patient's family after a discussion of the risks, benefits and alternatives to treatment. Questions regarding the procedure were encouraged and answered. A timeout was performed prior to the initiation of the procedure.  The right groin was prepped and draped in the usual sterile fashion, and a sterile drape was applied covering the operative field. Maximum barrier sterile technique with sterile gowns and gloves were used for the procedure. A timeout was performed prior to the initiation of the procedure.  The right femoral head was marked fluoroscopically. Under ultrasound guidance, the right common femoral artery was accessed with a micropuncture kit after the overlying soft tissues were anesthetized with 1% lidocaine. An ultrasound image was saved for documentation purposes. The micropuncture sheath was exchanged for a 5 [place] sheath over a Bentson wire. A closure arteriogram was performed through the side of the sheath confirming access within the right common femoral artery.  Over a Bentson wire, a Mickelson catheter was advanced to the level of the thoracic aorta where it was back bled and flushed. The catheter was then utilized to select  the hypertrophied right bronchial artery. A selective right bronchial arteriogram was performed.  With the use of a 0.014 fathom wire, a high-flow Renegade micro catheter was advanced into the distal aspect of the right bronchial artery supplying the superior segment of the right lower lobe. Repeat sub selective arteriogram was performed.  The distal aspect of the inferior segment of the right bronchial artery was then subsequently percutaneously particle embolized with approximately 3/4 of 1 vial of 300-500 micron Embospheres. The micro catheter was retracted into the caudal aspect of the right bronchial artery and a post embolization  arteriogram was performed.  Images reviewed and the procedure was terminated. All wires catheters and sheaths were removed from the patient. Hemostasis was achieved at the right groin access site with deployment of an Exoseal closure device. A dressing was placed. The patient tolerated the procedure well without immediate postprocedural complication.  FINDINGS: Selective right bronchial arteriogram demonstrates marked hypertrophy of this vessel as demonstrated on preceding chest CTA.  The caudal tributaries of the right bronchial artery were noted to supply an ill-defined area of hyperemia within the superior medial aspect of the right lower lobe correlating with the spiculated mass seen on preceding chest CT. Following percutaneous selective particle embolization, there is near absence of flow into these abnormal distal vessels supplying the known spiculated mass.  IMPRESSION: Technically successful right bronchial artery percutaneous particle embolization for active bleeding from the known infiltrative mass within the superior segment of the right lower lobe.   Electronically Signed   By: Sandi Mariscal M.D.   On: 08/10/2014 18:18   Ct Biopsy  08/16/2014   CLINICAL DATA:  Acute hemoptysis from a a large right lung mass. Hemoptysis has resolved after bronchial embolization. The patient also has a left adrenal mass and a destructive lesion of the left inferior iliac bone and acetabulum. The safest approach for tissue sampling is the destructive pelvic bone lesion which has a significant soft tissue component.  EXAM: CT GUIDED CORE BIOPSY OF LEFT ACETABULAR BONE LESION  ANESTHESIA/SEDATION: 2.0  Mg IV Versed; 50 mcg IV Fentanyl  Total Moderate Sedation Time: 20 minutes.  PROCEDURE: The procedure risks, benefits, and alternatives were explained to the patient. Questions regarding the procedure were encouraged and answered. The patient understands and consents to the procedure. A time-out was performed prior to the  procedure.  The left gluteal region was prepped with Betadine in a sterile fashion, and a sterile drape was applied covering the operative field. A sterile gown and sterile gloves were used for the procedure. Local anesthesia was provided with 1% Lidocaine.  CT was performed of the bony pelvis in a prone position. After choosing a site along the left gluteal region, a 17 gauge needle was advanced under CT guidance to the level of a destructive lesion involving the lower left iliac bone and superior acetabulum. Four separate 18 gauge core biopsy samples were then obtained through the 17 gauge needle. Material was submitted in formalin. A few Gel-Foam pledgets were advanced through the outer needle upon completion. Post biopsy imaging was performed after needle retraction.  COMPLICATIONS: None  FINDINGS: Destructive soft tissue mass involving the inferior left iliac bone extending into the superior acetabulum measures approximately 4.5 cm in greatest diameter. Solid tissue was obtained from the lesion. There were no visible bleeding complications by CT.  IMPRESSION: CT-guided core biopsy performed of the destructive soft tissue mass within the inferior left iliac bone extending into the superior acetabulum.   Electronically Signed  By: Aletta Edouard M.D.   On: 08/16/2014 17:18   Dg Chest Port 1 View  08/11/2014   CLINICAL DATA:  History of thumb opt this cysts  EXAM: PORTABLE CHEST - 1 VIEW  COMPARISON:  Portable chest x-ray of 08/10/2014 and CT chest of 08/07/2014  FINDINGS: The endotracheal tube has been removed. There is persistent opacity overlying the right hilum and infrahilar region consistent with the patient's known carcinoma. Mild left basilar linear atelectasis is present. Heart size is stable.  IMPRESSION: Endotracheal tube removed. No change in right hilar -infrahilar opacity consistent with mass by CT.   Electronically Signed   By: Ivar Drape M.D.   On: 08/11/2014 08:02   [place] Guide Roadmapping  08/10/2014   INDICATION: No known malignancy, now with large (approximately 4.5 x 3.0 cm) spiculated mass within the superior segment of the right lower lobe. The page underwent attempted bronchoscopic biopsy, however the biopsy could not be performed secondary to active bleeding into the airway. As such, the patient was intubated for airway protection purposes.  The patient was observed uneventfully overnight in the ICU, though repeat bronchoscopy performed this morning demonstrated persistent bleeding from this infiltrative right lower lobe mass.  As such, request made for bronchial artery embolization in hopes of avoiding potential life threatening hemoptysis.  EXAM: 1. ULTRASOUND GUIDANCE FOR ARTERIAL ACCESS 2. SELECTIVE RIGHT BRONCHIAL ARTERIOGRAM (THIRD ORDER) AND FLUOROSCOPIC GUIDED PERCUTANEOUS PARTICLE EMBOLIZATION.  COMPARISON:  Chest CTA - 08/07/2014  MEDICATIONS: Fentanyl 100 mcg IV; Versed at 3 mg IV  Sedation time: 30 minutes  CONTRAST:  59m OMNIPAQUE IOHEXOL 300 MG/ML  SOLN  FLUOROSCOPY TIME:  6 minutes 36 seconds (3702mGy)  COMPLICATIONS: None immediate  TECHNIQUE: Informed written consent was obtained from the patient's family after a discussion of the risks, benefits and alternatives to treatment. Questions regarding the procedure were encouraged and answered. A timeout was performed prior to the initiation of the procedure.  The right groin was prepped and draped in the usual sterile fashion, and a sterile drape was applied covering the operative field. Maximum barrier sterile technique with sterile gowns and gloves were used for the procedure. A timeout was performed prior to the initiation of the procedure.  The right femoral head was marked fluoroscopically. Under ultrasound guidance, the right common femoral artery was accessed with a micropuncture kit after the overlying soft tissues were anesthetized with 1% lidocaine. An ultrasound image was saved for  documentation purposes. The micropuncture sheath was exchanged for a 5 [place] sheath over a Bentson wire. A closure arteriogram was performed through the side of the sheath confirming access within the right common femoral artery.  Over a Bentson wire, a Mickelson catheter was advanced to the level of the thoracic aorta where it was back bled and flushed. The catheter was then utilized to select the hypertrophied right bronchial artery. A selective right bronchial arteriogram was performed.  With the use of a 0.014 fathom wire, a high-flow Renegade micro catheter was advanced into the distal aspect of the right bronchial artery supplying the superior segment of the right lower lobe. Repeat sub selective arteriogram was performed.  The distal aspect of the inferior segment of the right bronchial artery was then subsequently percutaneously particle embolized with approximately 3/4 of 1 vial of 300-500 micron Embospheres. The micro catheter was retracted into the caudal aspect of the right bronchial artery and a post embolization arteriogram was performed.  Images reviewed and the procedure was terminated. All wires catheters and sheaths were removed from the patient. Hemostasis was achieved at the right groin access site with deployment of an Exoseal closure device. A dressing was placed. The patient tolerated the procedure well without immediate postprocedural complication.  FINDINGS: Selective right bronchial arteriogram demonstrates marked hypertrophy of this vessel as demonstrated on preceding chest CTA.  The caudal tributaries of the right bronchial artery were noted to supply an ill-defined area of hyperemia within the superior medial aspect of the right lower lobe correlating with the spiculated mass seen on preceding chest CT. Following percutaneous selective particle embolization, there is near absence of flow into these abnormal distal vessels supplying the known spiculated mass.  IMPRESSION:  Technically successful right bronchial artery percutaneous particle embolization for active bleeding from the known infiltrative mass within the superior segment of the right lower lobe.   Electronically Signed   By: Sandi Mariscal M.D.   On: 08/10/2014 18:18    ASSESSMENT AND PLAN: This is a very pleasant 44 years old Asian female never smoker with recently diagnosed with stage IV non-small cell lung cancer, adenocarcinoma presented with right middle lobe lung mass in addition to right hilar adenopathy and metastatic disease to the bones, brain and left adrenal gland.  Dr. Julien Nordmann was consulted and participated in a large portion of the visit.  The patient tested positive for an EGFR mutation. Therefore, she will be able to avoid chemotherapy and is eligible for tyrosine kinase inhibitor therapy.  We will start her on tarceva 111m daily, and this prescription has been sent to our outpatient pharmacy.  The patient understands that she is to take this drug on an empty stomach, either 1 hour before a meal or at least 2 hours after. She will avoid the consumption of grapefruit juice while on this medication. Dr. MJulien Nordmannspent time education the patient of potential side effects: these include: acneform rash, decreased appetite, fatigue, diarrhea, cough, mucositis, and diarrhea. She has been advised to have imodium on hand, and to take 1 dose after each loose stool.  I have placed a referral to physical therapy to evaluate and treat her left leg weakness and numbness. The patient will return in 2 week for labs and a follow up visit.   She was advised to call immediately if she has any concerning symptoms in the interval. The patient voices understanding of current disease status and treatment options and is in agreement with the current care plan.  All questions were answered. The patient knows to call the clinic with any problems, questions or concerns. We can certainly see the patient much sooner if  necessary.  HLaurie Panda NP 09/09/2014  ADDENDUM: Hematology/Oncology Attending: I had a face to face encounter with the patient. I recommended her care plan. This is a very pleasant 44years old Asian female recently diagnosed with a stage IV non-small cell lung cancer, adenocarcinoma with positive EGFR mutation with deletion in exon 19. The patient completed a course of palliative radiotherapy to the metastatic lesions in the brain, chest as well as bone. She is here today for evaluation and discussion of her treatment options. I had a lengthy discussion with the patient today about her current condition and treatment options based on the molecular studies. I recommended for the patient treatment with Tarceva 150 mg by mouth daily. I discussed with the patient the adverse effect of this treatment including but not limited to skin rash, diarrhea, interstitial lung  disease, liver, renal dysfunction. I will send her prescription to [facility] for refill and the patient is expected to start the first dose of her treatment in the next few days. For the weakness in the left lower extremity, we will refer the patient to physical therapy for evaluation. She will come back for follow-up visit in 2 weeks for reevaluation and management of any adverse effect of her treatment. The patient was advised to call immediately if she has any concerning symptoms in the interval.  Disclaimer: This note was dictated with voice recognition software. Similar sounding words can inadvertently be transcribed and may not be corrected upon review.  Eilleen Kempf., MD 09/09/2014

## 2014-09-09 NOTE — Progress Notes (Signed)
I faxed prior auth req for tarceva

## 2014-09-09 NOTE — Telephone Encounter (Signed)
Gave adn printed appt sched and avs for pt for Sept °

## 2014-09-13 ENCOUNTER — Other Ambulatory Visit: Payer: Self-pay | Admitting: Medical Oncology

## 2014-09-13 NOTE — Progress Notes (Signed)
tarceva refilled last week.

## 2014-09-14 ENCOUNTER — Encounter: Payer: Self-pay | Admitting: Internal Medicine

## 2014-09-14 ENCOUNTER — Telehealth: Payer: Self-pay | Admitting: *Deleted

## 2014-09-14 NOTE — Telephone Encounter (Signed)
Called patient to inform of PT appt. On 09-16-14 @ 10:15 am @ [facility], spoke with patient and she is aware of this appt.

## 2014-09-14 NOTE — Progress Notes (Signed)
I faxed medical notes to [employer] for prior auth for tarceva

## 2014-09-15 ENCOUNTER — Telehealth: Payer: Self-pay | Admitting: Medical Oncology

## 2014-09-15 NOTE — Telephone Encounter (Signed)
[employer] approved tarceva, however pt may have copay or deductible to meet.

## 2014-09-16 ENCOUNTER — Ambulatory Visit: Payer: No Typology Code available for payment source | Attending: Radiation Oncology | Admitting: Physical Therapy

## 2014-09-16 ENCOUNTER — Telehealth: Payer: Self-pay | Admitting: *Deleted

## 2014-09-16 DIAGNOSIS — R29898 Other symptoms and signs involving the musculoskeletal system: Secondary | ICD-10-CM | POA: Insufficient documentation

## 2014-09-16 DIAGNOSIS — R269 Unspecified abnormalities of gait and mobility: Secondary | ICD-10-CM | POA: Insufficient documentation

## 2014-09-16 DIAGNOSIS — C3400 Malignant neoplasm of unspecified main bronchus: Secondary | ICD-10-CM

## 2014-09-16 MED ORDER — ERLOTINIB HCL 150 MG PO TABS: 150.0000 mg | ORAL_TABLET | Freq: Every day | ORAL | Status: DC

## 2014-09-16 NOTE — Therapy (Signed)
[place], [place], [address] Phone: [phone]   Fax:  [phone]  Physical Therapy Evaluation  Patient Details  Name: Deborah Foster MRN: [id number] Date of Birth: 09-07-1970 Referring Provider:  Thea Silversmith, MD  Encounter Date: 09/16/2014      PT End of Session - 09/16/14 1332    Visit Number 1   Number of Visits 9   Date for PT Re-Evaluation 10/22/14   PT Start Time 1015   PT Stop Time 1105   PT Time Calculation (min) 50 min   Activity Tolerance Patient limited by pain   Behavior During Therapy [facility] for tasks assessed/performed      Past Medical History  Diagnosis Date  . Cancer   . Pneumonia   . Hemoptysis   . Adrenal cancer   . Lung mass   . Hypokalemia     Past Surgical History  Procedure Laterality Date  . Video bronchoscopy Bilateral 08/09/2014    Procedure: VIDEO BRONCHOSCOPY WITHOUT FLUORO;  Surgeon: [name], MD;  Location: [place];  Service: Cardiopulmonary;  Laterality: Bilateral;    There were no vitals filed for this visit.  Visit Diagnosis:  Left leg weakness  Gait abnormality      Subjective Assessment - 09/16/14 1023    Subjective pt is here because of problems with her left leg that she thinks is related to her cancer and the related to the radiation.  the pain has improved with radiation  had had leg pain about a month    Patient is accompained by: Interpreter   Pertinent History lung cancer diagnoes in July 2016 with metastsis to bone, brain, lung and left adrenal gland. PET scan 09/09/2014 revealed a large lesion in left acetabulum extending to left inferior pubic ramus. She also has lesions in lumbar spine and right humerus and manubrium    Limitations Sitting;Standing;Walking   How long can you sit comfortably? an hour or two.    How long can you stand comfortably? pain shoots down her leg when she stands about    How long can you walk  comfortably? it helps to walk, but is starts to irriatate hip    Patient Stated Goals get leg stronger    Currently in Pain? Yes   Pain Score 3    Pain Location Leg  radiates down leg to ankle at times    Pain Orientation Left   Pain Descriptors / Indicators Aching   Pain Frequency Intermittent   Aggravating Factors  activity    Pain Relieving Factors sometimes takes medicine   Effect of Pain on Daily Activities limts them. Her husband, who works third shift, helps her             [facility] PT Assessment - 09/16/14 0001    Assessment   Medical Diagnosis lung cancer    Precautions   Precautions Other (comment)  metastatic cancer    Restrictions   Weight Bearing Restrictions No   Balance Screen   Has the patient fallen in the past 6 months No   Has the patient had a decrease in activity level because of a fear of falling?  Yes   Is the patient reluctant to leave their home because of a fear of falling?  No   Home Environment   Living Environment Private residence   Living Arrangements Spouse/significant other;Children  3 children 10,9,6   Available Help at Discharge Available PRN/intermittently   Type of Home  House   Home Access Stairs to enter   Entrance Stairs-Number of Steps 2   Entrance Stairs-Rails Left   Home Layout One level   Home Equipment None   Prior Function   Level of Independence Independent with basic ADLs  assit from husband as needed   Vocation Unemployed  since August 07 2014 due to sickness   Vocation Requirements used to work at [employer]    Leisure family, likes to [employer]   Overall Cognitive Status Within Functional Limits for tasks assessed   Observation/Other Assessments   Observations left leg and thigh is smaller  she has area of swelling in left popliteal fossa    Sensation   Light Touch Appears Intact   Coordination   Gross Motor Movements are Fluid and Coordinated No  limited by left lower extemity weakness   Sit to Stand    Comments 4 repetitions of sit to stand in 30 seconds   Strength   Right Hip Flexion 3/5   Right Hip ABduction 3/5   Right Hip ADduction --   Left Hip Flexion 1/5   Left Hip ADduction 2+/5   Right Knee Flexion 4+/5   Right Knee Extension 4+/5   Left Knee Flexion 2/5   Left Knee Extension 3-/5   Right Ankle Dorsiflexion 4+/5   Right Ankle Plantar Flexion 4+/5   Left Ankle Dorsiflexion 2/5   Left Ankle Plantar Flexion 2/5   Bed Mobility   Bed Mobility --  extra time needed for all bed mobility, pain with mobility    Supine to Sit 3: Mod assist   Supine to Sit Details (indicate cue type and reason) needs assist to lift upper body off mat   Sit to Supine 6: Modified independent (Device/Increase time)  pt manually lifts her left leg with her ams    Ambulation/Gait   Ambulation/Gait Yes   Assistive device None   Gait Pattern Left circumduction;Decreased step length - left;Decreased weight shift to left;Left genu recurvatum;Trendelenburg;Antalgic;Lateral trunk lean to right;Poor foot clearance - left                                   Long Term Clinic Goals - 09/16/14 1348    CC Long Term Goal  #1   Title Patient will be independent in a home exercise program   Time 4   Period Weeks   Status New   CC Long Term Goal  #2   Title pt will verbalize understanding of core stabalizations and ways to incorporate into daily activities for safety    Time 4   Period Weeks   Status New   CC Long Term Goal  #3   Title Pt will be able to walk for 5 minutes without stopping with assistive device as needed so that she will be able to walk in the community without another person nearby   Time 4   Period Weeks   Status New   CC Long Term Goal  #4   Title pt will be independent in sit to supine and return with minimal rest stops so she can be on her own at home.   Time 4   Period Weeks   Status New   CC Long Term Goal  #5   Title pt will be able to complete 7  repetitions of sit to stand in 30 seconds demonstrating functional strength improvement  Time 4   Period Weeks   Status New            Plan - 09/16/14 1334    Clinical Impression Statement very pleasant 44 yo who is here with interpreter.  She has large bony metastaic lesion in left acetabulum with significant leg weakness along with other  metastatic lesions.  Her main concern is her leg weakness. She is limited  bed mobility and gait. She wants to learn an exercise program and , while we discussed the potential need for an assistance device, she hopes she does not need one. My impression is that she would benefit from an assitive device to compensate for the weakness in her leg and provide stablity to avoid falls. She will also beneftir from a core  and extremity bodyweight  strengthening with neurtal spine with safety education.    Pt will benefit from skilled therapeutic intervention in order to improve on the following deficits Abnormal gait;Difficulty walking;Decreased safety awareness;Decreased endurance;Decreased activity tolerance;Decreased knowledge of precautions;Pain;Decreased knowledge of use of DME;Decreased strength   Rehab Potential Good   Clinical Impairments Affecting Rehab Potential lung cancer with lesions to brain, lung, bone and adrenal gland with large lesion at left pelvis   PT Frequency 2x / week   PT Duration 4 weeks   PT Treatment/Interventions ADLs/Self Care Home Management;Patient/family education;DME Instruction;Gait training;Stair training;Functional mobility training;Therapeutic activities;Therapeutic exercise;Orthotic Fit/Training;Neuromuscular re-education   PT Next Visit Plan Breathing exercise, core stabalizaion education, time standing balance. HEP for abdominal stabalization, explore options for assistive devices: rollator vs. straight cane.    Consulted and Agree with Plan of Care Patient         Problem List Patient Active Problem List    Diagnosis Date Noted  . Non-small cell carcinoma of lung, stage 4 08/26/2014  . Bone disease, metabolic 77/93/9030  . Brain metastases   . Palliative care encounter   . Adrenal mass, left   . CAP (community acquired pneumonia) 08/07/2014  . Pneumonia 08/07/2014  . Lung mass 08/07/2014  . Hemoptysis 08/07/2014   Donato Heinz. Owens Shark, PT  09/16/2014, 1:55 PM  [place], [place], [address] Phone: [phone]   Fax:  [phone]

## 2014-09-16 NOTE — Telephone Encounter (Signed)
[facility] faxed confirmation of facsimile receipt for Tarceva prescription referral.  [facility] will verify insurance and make delivery arrangements with patient.

## 2014-09-16 NOTE — Telephone Encounter (Signed)
Call received from [facility] with Deborah Foster about mutual patient.  Per Deborah Foster this Deborah Foster reports Deborah Foster instructed her to call her insurance company to determine what pharmacy can fill the Deborah Foster ordered by Deborah Foster.  Deborah Foster has come to Deborah Foster office requesting help as Deborah Foster works with the [place] community.  She has a medication list with Deborah Foster hand written on the form.  What do we need to do to help her with this medicine?"  Deborah Foster is pharmacy per Deborah Foster scanned copy of Deborah Foster card for this patient.   This nurse will send Deborah Foster order to Deborah Foster who handles oral oncology medications for Deborah Foster.  Deborah Foster will notify patient and Deborah Foster.

## 2014-09-17 ENCOUNTER — Telehealth: Payer: Self-pay | Admitting: *Deleted

## 2014-09-17 LAB — GUARDANT 360

## 2014-09-17 NOTE — Telephone Encounter (Signed)
Oncology Nurse Navigator Documentation  Oncology Nurse Navigator Flowsheets 09/17/2014  Navigator Encounter Type Telephone/called to check on medication.  Patient has not received medication yet.  I will follow up with managed care dept.    Patient Visit Type Follow-up  Treatment Phase -  Barriers/Navigation Needs -  Education -  Interventions -  Time Spent with Patient 30

## 2014-09-21 LAB — AFB CULTURE WITH SMEAR (NOT AT ARMC)
Acid Fast Smear: NONE SEEN
Special Requests: NORMAL

## 2014-09-22 ENCOUNTER — Encounter: Payer: Self-pay | Admitting: *Deleted

## 2014-09-22 ENCOUNTER — Telehealth: Payer: Self-pay | Admitting: *Deleted

## 2014-09-22 NOTE — Telephone Encounter (Signed)
Oncology Nurse Navigator Documentation  Oncology Nurse Navigator Flowsheets 09/22/2014  Navigator Encounter Type Telephone/I called patient to see how she was doing and if she has received drug medication.  She stated she has not received medication yet.  I called drug rep to see if she could get drug.  I left a vm message with drug rep with my name and phone number  Patient Visit Type Follow-up  Treatment Phase Pre-tx  Barriers/Navigation Needs Getting medication   Interventions Coordination of Care  Time Spent with Patient 30

## 2014-09-22 NOTE — Progress Notes (Signed)
Oncology Nurse Navigator Documentation  Oncology Nurse Navigator Flowsheets 09/22/2014  Navigator Encounter Type Telephone/I called Deborah Foster today to see how she was doing and if she had received medication.  She still has not received mediation.  I called drug rep to see if she was eligible for help.  He stated yes.  I stated I notified managed care dept, Raquel. He stated he would follow up with her tomorrow to help.  I updated Dr. Julien Nordmann.   Patient Visit Type Follow-up  Treatment Phase Pre tx  Barriers/Navigation Needs Getting medication  Interventions Coordination of Care  Time Spent with Patient 30

## 2014-09-23 ENCOUNTER — Other Ambulatory Visit: Payer: Self-pay | Admitting: Medical Oncology

## 2014-09-23 ENCOUNTER — Telehealth: Payer: Self-pay | Admitting: *Deleted

## 2014-09-23 ENCOUNTER — Encounter: Payer: Self-pay | Admitting: Radiation Oncology

## 2014-09-23 DIAGNOSIS — C3400 Malignant neoplasm of unspecified main bronchus: Secondary | ICD-10-CM

## 2014-09-23 DIAGNOSIS — C3490 Malignant neoplasm of unspecified part of unspecified bronchus or lung: Secondary | ICD-10-CM

## 2014-09-23 MED ORDER — ERLOTINIB HCL 150 MG PO TABS: 150.0000 mg | ORAL_TABLET | Freq: Every day | ORAL | Status: DC

## 2014-09-23 NOTE — Telephone Encounter (Signed)
Oncology Nurse Navigator Documentation  Oncology Nurse Navigator Flowsheets 09/23/2014  Navigator Encounter Type Telephone/I called [facility] to see about medication delivery.  They stated account has been discontinued.  They stated I need to call [place] and they gave me the phone number.  [phone].  I called and was put on hold several times and then hung up on.  I called back.  They stated that the patient has to call and give them more information.  Patient speaks very little english and I wanted to expedite.  They said they will not send until patient calls.  I called patient to update her.  I gave her the phone number to call.  She verbalized understanding and will call today.    Patient Visit Type Follow-up  Treatment Phase Pre tx  Barriers/Navigation Needs Coordination of care  Education Next steps to get medication   Interventions Coordination of Care  Time Spent with Patient > 120

## 2014-09-24 ENCOUNTER — Other Ambulatory Visit: Payer: Self-pay | Admitting: Medical Oncology

## 2014-09-24 ENCOUNTER — Telehealth: Payer: Self-pay | Admitting: Physician Assistant

## 2014-09-24 ENCOUNTER — Encounter: Payer: Self-pay | Admitting: Physician Assistant

## 2014-09-24 ENCOUNTER — Ambulatory Visit (HOSPITAL_BASED_OUTPATIENT_CLINIC_OR_DEPARTMENT_OTHER): Payer: No Typology Code available for payment source | Admitting: Physician Assistant

## 2014-09-24 ENCOUNTER — Other Ambulatory Visit (HOSPITAL_BASED_OUTPATIENT_CLINIC_OR_DEPARTMENT_OTHER): Payer: No Typology Code available for payment source

## 2014-09-24 ENCOUNTER — Ambulatory Visit (HOSPITAL_BASED_OUTPATIENT_CLINIC_OR_DEPARTMENT_OTHER): Payer: No Typology Code available for payment source

## 2014-09-24 VITALS — BP 107/67 | HR 96 | Temp 98.4°F | Resp 18 | Ht 59.0 in | Wt 103.7 lb

## 2014-09-24 DIAGNOSIS — C342 Malignant neoplasm of middle lobe, bronchus or lung: Secondary | ICD-10-CM

## 2014-09-24 DIAGNOSIS — Z79899 Other long term (current) drug therapy: Secondary | ICD-10-CM | POA: Diagnosis not present

## 2014-09-24 DIAGNOSIS — R300 Dysuria: Secondary | ICD-10-CM

## 2014-09-24 DIAGNOSIS — C7931 Secondary malignant neoplasm of brain: Secondary | ICD-10-CM | POA: Diagnosis not present

## 2014-09-24 DIAGNOSIS — C3490 Malignant neoplasm of unspecified part of unspecified bronchus or lung: Secondary | ICD-10-CM | POA: Diagnosis not present

## 2014-09-24 DIAGNOSIS — C3491 Malignant neoplasm of unspecified part of right bronchus or lung: Secondary | ICD-10-CM

## 2014-09-24 LAB — COMPREHENSIVE METABOLIC PANEL (CC13)
ALT: 20 U/L (ref 0–55)
ANION GAP: 10 meq/L (ref 3–11)
AST: 18 U/L (ref 5–34)
Albumin: 3 g/dL — ABNORMAL LOW (ref 3.5–5.0)
Alkaline Phosphatase: 191 U/L — ABNORMAL HIGH (ref 40–150)
BUN: 5 mg/dL — ABNORMAL LOW (ref 7.0–26.0)
CALCIUM: 8.8 mg/dL (ref 8.4–10.4)
CHLORIDE: 107 meq/L (ref 98–109)
CO2: 26 meq/L (ref 22–29)
CREATININE: 0.6 mg/dL (ref 0.6–1.1)
Glucose: 98 mg/dl (ref 70–140)
Potassium: 2.9 mEq/L — CL (ref 3.5–5.1)
Sodium: 143 mEq/L (ref 136–145)
Total Bilirubin: 0.63 mg/dL (ref 0.20–1.20)
Total Protein: 5.9 g/dL — ABNORMAL LOW (ref 6.4–8.3)

## 2014-09-24 LAB — CBC WITH DIFFERENTIAL/PLATELET
BASO%: 0.7 % (ref 0.0–2.0)
BASOS ABS: 0 10*3/uL (ref 0.0–0.1)
EOS ABS: 0.1 10*3/uL (ref 0.0–0.5)
EOS%: 1.2 % (ref 0.0–7.0)
HEMATOCRIT: 35.7 % (ref 34.8–46.6)
HGB: 12.2 g/dL (ref 11.6–15.9)
LYMPH#: 0.7 10*3/uL — AB (ref 0.9–3.3)
LYMPH%: 16.2 % (ref 14.0–49.7)
MCH: 30.7 pg (ref 25.1–34.0)
MCHC: 34.3 g/dL (ref 31.5–36.0)
MCV: 89.4 fL (ref 79.5–101.0)
MONO#: 0.5 10*3/uL (ref 0.1–0.9)
MONO%: 12.3 % (ref 0.0–14.0)
NEUT#: 3.1 10*3/uL (ref 1.5–6.5)
NEUT%: 69.6 % (ref 38.4–76.8)
PLATELETS: 272 10*3/uL (ref 145–400)
RBC: 3.99 10*6/uL (ref 3.70–5.45)
RDW: 15.6 % — ABNORMAL HIGH (ref 11.2–14.5)
WBC: 4.4 10*3/uL (ref 3.9–10.3)

## 2014-09-24 LAB — URINALYSIS, MICROSCOPIC - CHCC
BILIRUBIN (URINE): NEGATIVE
GLUCOSE UR CHCC: NEGATIVE mg/dL
Ketones: NEGATIVE mg/dL
Nitrite: NEGATIVE
PH: 6 (ref 4.6–8.0)
Protein: 100 mg/dL
SPECIFIC GRAVITY, URINE: 1.025 (ref 1.003–1.035)
UROBILINOGEN UR: 0.2 mg/dL (ref 0.2–1)

## 2014-09-24 LAB — TECHNOLOGIST REVIEW

## 2014-09-24 MED ORDER — ACYCLOVIR 400 MG PO TABS: 400.0000 mg | ORAL_TABLET | Freq: Three times a day (TID) | ORAL | Status: DC

## 2014-09-24 MED ORDER — POTASSIUM CHLORIDE CRYS ER 20 MEQ PO TBCR: 40.0000 meq | EXTENDED_RELEASE_TABLET | Freq: Every day | ORAL | Status: DC

## 2014-09-24 NOTE — Telephone Encounter (Signed)
per po fto sch pt appt-gave pt copy of avs-sent back to lab °

## 2014-09-24 NOTE — Progress Notes (Signed)
[place] [phone]   [phone]  OFFICE PROGRESS NOTE  [name], MD [place] [address]  DIAGNOSIS: Stage IV (T2a, N1, M1 B) non-small cell lung cancer, adenocarcinoma presented with right middle lobe lung mass in addition to right hilar adenopathy as well as metastatic disease to the bone, brain as well as left adrenal metastasis diagnosed in July 2016  PRIOR THERAPY: The patient completed palliative radiotherapy to the brain as well as metastatic bone lesions in the pelvis on 09/07/14  CURRENT THERAPY: None.  INTERVAL HISTORY: Deborah Foster 44 y.o. female returns to the clinic today for hospital follow-up visit accompanied by her interpreter. The patient was recently diagnosed with metastatic non-small cell lung cancer, adenocarcinoma. She has recently completed palliative radiation therapy to her brain, chest, and pelvis. She states that she will receive her Tarceva today. She complains of her mouth being sore and tender with some ulcerations. The symptoms of been present for approximately one week.  She also complains of some dysuria. The patient denies fevers, chills, nausea, vomiting, or changes in bowel or bladder habits. She has no shortness of breath, chest pain, cough, hemoptysis, or palpitations. She denies headaches, dizziness, or vision changes. She does have left leg numbness and weakness which is stable.   MEDICAL HISTORY: Past Medical History  Diagnosis Date  . Cancer   . Pneumonia   . Hemoptysis   . Adrenal cancer   . Lung mass   . Hypokalemia     ALLERGIES:  has No Known Allergies.  MEDICATIONS:  Current Outpatient Prescriptions  Medication Sig Dispense Refill  . acyclovir (ZOVIRAX) 400 MG tablet Take 1 tablet (400 mg total) by mouth 3 (three) times daily. 30 tablet 0  . albuterol (PROVENTIL HFA;VENTOLIN HFA) 108 (90 BASE) MCG/ACT inhaler Inhale 2 puffs into the lungs every 6 (six) hours  as needed for wheezing or shortness of breath. (Patient not taking: Reported on 08/31/2014) 1 Inhaler 2  . dexamethasone (DECADRON) 4 MG tablet Take 1 tablet (4 mg total) by mouth 2 (two) times daily. 60 tablet 0  . erlotinib (TARCEVA) 150 MG tablet Take 1 tablet (150 mg total) by mouth daily. Take on an empty stomach 1 hour before meals or 2 hours after. 30 tablet 2  . fluconazole (DIFLUCAN) 100 MG tablet Take 1 tablet (100 mg total) by mouth daily. (Patient not taking: Reported on 09/09/2014) 8 tablet 0  . HYDROcodone-acetaminophen (NORCO) 10-325 MG per tablet Take 1 tablet by mouth every 6 (six) hours as needed for moderate pain or severe pain. (Patient not taking: Reported on 08/31/2014) 30 tablet 0  . Misc Natural Products (TURMERIC CURCUMIN) CAPS Take 750 capsules by mouth.    . morphine (MSIR) 30 MG tablet     . ondansetron (ZOFRAN) 4 MG tablet Take 1 tablet (4 mg total) by mouth every 8 (eight) hours as needed for nausea or vomiting. (Patient not taking: Reported on 09/07/2014) 30 tablet 0  . potassium chloride SA (K-DUR,KLOR-CON) 20 MEQ tablet Take 2 tablets (40 mEq total) by mouth daily. 20 tablet 0  . UNABLE TO FIND Take 650 mg by mouth. Med Name:corduceps sinesis     No current facility-administered medications for this visit.    SURGICAL HISTORY:  Past Surgical History  Procedure Laterality Date  . Video bronchoscopy Bilateral 08/09/2014    Procedure: VIDEO BRONCHOSCOPY WITHOUT FLUORO;  Surgeon: [name], MD;  Location: [place];  Service: Cardiopulmonary;  Laterality: Bilateral;    REVIEW OF SYSTEMS:  Constitutional: positive for fatigue Eyes: negative Ears, nose, mouth, throat, and face: positive for sore mouth Respiratory: negative Cardiovascular: negative Gastrointestinal: negative Genitourinary:negative Integument/breast: negative Hematologic/lymphatic: negative Musculoskeletal:positive for bone pain and muscle weakness Neurological:  negative Behavioral/Psych: negative Endocrine: negative Allergic/Immunologic: negative   PHYSICAL EXAMINATION: General appearance: alert, cooperative, fatigued and no distress Head: Normocephalic, without obvious abnormality, atraumatic Neck: no adenopathy, no JVD, supple, symmetrical, trachea midline and thyroid not enlarged, symmetric, no tenderness/mass/nodules Lymph nodes: Cervical, supraclavicular, and axillary nodes normal. Resp: clear to auscultation bilaterally Back: symmetric, no curvature. ROM normal. No CVA tenderness. Cardio: regular rate and rhythm, S1, S2 normal, no murmur, click, rub or gallop GI: soft, non-tender; bowel sounds normal; no masses,  no organomegaly Extremities: extremities normal, atraumatic, no cyanosis or edema Neurologic: Alert and oriented X 3, normal strength and tone. Normal symmetric reflexes. Normal coordination and gait Mouth: Reveals erythematous ulcers on the inner lower lip and buccal mucosa  ECOG PERFORMANCE STATUS: 1 - Symptomatic but completely ambulatory  Blood pressure 107/67, pulse 96, temperature 98.4 F (36.9 C), temperature source Oral, resp. rate 18, height $RemoveBe'4\' 11"'rgoArgNzA$  (1.499 m), weight 103 lb 11.2 oz (47.038 kg), SpO2 100 %.  LABORATORY DATA: Lab Results  Component Value Date   WBC 4.4 09/24/2014   HGB 12.2 09/24/2014   HCT 35.7 09/24/2014   MCV 89.4 09/24/2014   PLT 272 09/24/2014      Chemistry      Component Value Date/Time   NA 143 09/24/2014 0847   NA 135 08/17/2014 0553   K 2.9* 09/24/2014 0847   K 3.2* 08/17/2014 0553   CL 96* 08/17/2014 0553   CO2 26 09/24/2014 0847   CO2 26 08/17/2014 0553   BUN 5.0* 09/24/2014 0847   BUN <5* 08/17/2014 0553   CREATININE 0.6 09/24/2014 0847   CREATININE 0.41* 08/17/2014 0553      Component Value Date/Time   CALCIUM 8.8 09/24/2014 0847   CALCIUM 8.4* 08/17/2014 0553   ALKPHOS 191* 09/24/2014 0847   ALKPHOS 145* 08/08/2014 0458   AST 18 09/24/2014 0847   AST 16 08/08/2014 0458    ALT 20 09/24/2014 0847   ALT 14 08/08/2014 0458   BILITOT 0.63 09/24/2014 0847   BILITOT 0.5 08/08/2014 0458       RADIOGRAPHIC STUDIES: Nm Pet Image Initial (pi) Skull Base To Thigh  09/09/2014   CLINICAL DATA:  Initial treatment strategy for lung carcinoma. Non-small cell lung cancer  EXAM: NUCLEAR MEDICINE PET SKULL BASE TO THIGH  TECHNIQUE: 6.3 mCi F-18 FDG was injected intravenously. Full-ring PET imaging was performed from the skull base to thigh after the radiotracer. CT data was obtained and used for attenuation correction and anatomic localization.  FASTING BLOOD GLUCOSE:  Value: 69 mg/dl  COMPARISON:  CT 08/07/2014  FINDINGS: NECK  Subtle hypermetabolic activity in the LEFT temporal lobe corresponds to large metastasis on comparison brain MRI.  CHEST  Hypermetabolic nodule in the RIGHT infrahilar lung adjacent to the bronchus intermedius with SUV max equal 11.7. There is a hypermetabolic mass in the RIGHT lower lobe measuring 3.5 cm with SUV max equal 15.3. There is peripheral postobstructive pneumonitis in the RIGHT lower lobe.  Hypermetabolic RIGHT lower paratracheal lymph node on image 65.  ABDOMEN/PELVIS  Enlargement of the LEFT adrenal gland to 5 cm with SUV max equal 16.8.  No abnormal metabolic activity in the liver. No hypermetabolic abdominal or pelvic lymph nodes.  SKELETON  Widespread lytic hypermetabolic skeletal metastasis. Large lesion in the LEFT acetabulum extending into the LEFT inferior pubic ramus with expansile soft tissue component and intense metabolic activity (SUV max 11.6). Additional smaller lesions are present within sacrum and lumbar spine (L1-L2). Additional skeletal metastasis within the RIGHT humeral head and manubrium. The medial clavicular heads are also involved.  IMPRESSION: 1. Hypermetabolic RIGHT lower lobe mass and RIGHT infrahilar nodule consists with primary bronchogenic carcinoma. 2. Local hypermetabolic nodal metastasis to the ipsilateral mediastinum.  3. Distant soft tissue metastasis to the LEFT adrenal gland and brain. 4. Skeletal metastasis involving the pelvis, sacrum, lumbar spine, RIGHT humerus, and manubrium.   Electronically Signed   By: Suzy Bouchard M.D.   On: 09/09/2014 09:29    ASSESSMENT AND PLAN: This is a very pleasant 44 years old Asian female never smoker with recently diagnosed with stage IV non-small cell lung cancer, adenocarcinoma presented with right middle lobe lung mass in addition to right hilar adenopathy and metastatic disease to the bones, brain and left adrenal gland.  Dr. Julien Nordmann was consulted and participated in a large portion of the visit.  The patient tested positive for an EGFR mutation. Therefore, she will be able to avoid chemotherapy and is eligible for tyrosine kinase inhibitor therapy.  She is to receive her Tarceva 150 mg capsules today. Patient is last start this medication tomorrow morning. The patient understands that she is to take this drug on an empty stomach, either 1 hour before a meal or at least 2 hours after. She will avoid the consumption of grapefruit juice while on this medication. She'll follow-up in 2 weeks for another symptom management visit. Her potassium is low at 2.9 and a prescription for potassium chloride 40 mEq by mouth daily for the next 10 days was sent her pharmacy of record via E scribe. To further evaluate the dysuria we'll obtain a clean catch urinalysis and urine culture and sensitivity. She had an infection be present, appropriate antibody therapy will be instituted. She has been advised to have imodium on hand, and to take 1 dose after each loose stool.  She was advised to call immediately if she has any concerning symptoms in the interval. The patient voices understanding of current disease status and treatment options and is in agreement with the current care plan.  All questions were answered. The patient knows to call the clinic with any problems, questions or concerns.  We can certainly see the patient much sooner if necessary.  Carlton Adam, PA-C 09/24/2014

## 2014-09-26 LAB — URINE CULTURE

## 2014-09-27 ENCOUNTER — Telehealth: Payer: Self-pay | Admitting: Physician Assistant

## 2014-09-27 ENCOUNTER — Telehealth: Payer: Self-pay | Admitting: *Deleted

## 2014-09-27 MED ORDER — AMOXICILLIN 500 MG PO CAPS: 500.0000 mg | ORAL_CAPSULE | Freq: Two times a day (BID) | ORAL | Status: DC

## 2014-09-27 NOTE — Telephone Encounter (Signed)
Oncology Nurse Navigator Documentation  Oncology Nurse Navigator Flowsheets 09/27/2014  Navigator Encounter Type Telephone/Called to see if patient was able to speak with her pharmacy that was distributing her medication.  I left a vm message with my name and phone number to call if needed assisatance  Patient Visit Type Follow-up  Treatment Phase Pre Tx  Barriers/Navigation Needs (No Data)  Education   Interventions Coordination of Care  Coordination of Care Other  Time Spent with Patient 15

## 2014-09-27 NOTE — Telephone Encounter (Signed)
As per Adrena Pt. Was called to inform her that a prescription for Amoxicillin was sent to the pharmacy for her to pick up for her urine infection. Pt. Verbalized understanding and stated she would pick prescription up at the pharmacy.

## 2014-09-28 ENCOUNTER — Telehealth: Payer: Self-pay | Admitting: *Deleted

## 2014-09-28 NOTE — Telephone Encounter (Signed)
Oncology Nurse Navigator Documentation  Oncology Nurse Navigator Flowsheets 09/28/2014  Navigator Encounter Type Telephone/Called patient today to see if she was able to get in touch with insurance company about medication.  She did call and she received her medication on 09/24/14 and took her first dose 09/25/14.  I asked if she had questions about how to take.  She stated she understood to take 1 hour before eating.  She was thankful for my help in getting her medication.    Patient Visit Type Follow-up  Treatment Phase Treatment  Barriers/Navigation Needs Education/mediction   Education Other  Time Spent with Patient 15

## 2014-09-30 ENCOUNTER — Ambulatory Visit: Payer: No Typology Code available for payment source | Admitting: Physical Therapy

## 2014-09-30 DIAGNOSIS — R269 Unspecified abnormalities of gait and mobility: Secondary | ICD-10-CM

## 2014-09-30 DIAGNOSIS — R29898 Other symptoms and signs involving the musculoskeletal system: Secondary | ICD-10-CM | POA: Diagnosis not present

## 2014-09-30 NOTE — Patient Instructions (Signed)
Take the Tarceva as prescribed Take the potassium as prescribed Follow up in 2 weeks

## 2014-09-30 NOTE — Patient Instructions (Addendum)
ANKLE: Pumps  Point toes down, then up. Do as often as you can in laying down, sitting or standing   .  FLEXION: Sitting (Active)  Sit, both feet flat. Lift right knee toward ceiling.  Try to sit up straight and keep you abdomen muscles working. Repeat 10 times with each leg.   Abduction / Adduction  Feet hip width apart, spread thighs out, then bring thighs together.   Repeat _10 __ times each direction.    Copyright  VHI. All rights reserved.  Diaphragmatic Breathing - Standing   Hands on top of navel, lips closed, breathe in through nose filling stomach with air, navel moves out toward hands. Exhale through puckered lips, hands follow exhalation in. Repeat _3__ times. Rest __5_ seconds between repeats. Do _3__ times per day.   !Isometric Gluteals   Tighten buttock muscles and back of legs  Repeat ____ times per set. Do ____ sets per session. Do ____ sessions per day.    IF ANY OF THESE EXERCISE CAUSE YOU PAIN, PLEASE STOP !!

## 2014-09-30 NOTE — Therapy (Signed)
[place], [place], [address] Phone: [phone]   Fax:  [phone]  Physical Therapy Treatment  Patient Details  Name: Deborah Foster MRN: [id number] Date of Birth: 1971-01-13 Referring Provider:  Thea Silversmith, MD  Encounter Date: 09/30/2014      PT End of Session - 09/30/14 1314    Visit Number 2   Number of Visits 9   Date for PT Re-Evaluation 10/22/14   PT Start Time 0850   PT Stop Time 0930   PT Time Calculation (min) 40 min   Activity Tolerance Patient tolerated treatment well  intemittent pain, that activity stopped    Behavior During Therapy [facility] for tasks assessed/performed      Past Medical History  Diagnosis Date  . Cancer   . Pneumonia   . Hemoptysis   . Adrenal cancer   . Lung mass   . Hypokalemia     Past Surgical History  Procedure Laterality Date  . Video bronchoscopy Bilateral 08/09/2014    Procedure: VIDEO BRONCHOSCOPY WITHOUT FLUORO;  Surgeon: [name], MD;  Location: [facility];  Service: Cardiopulmonary;  Laterality: Bilateral;    There were no vitals filed for this visit.  Visit Diagnosis:  Left leg weakness  Gait abnormality      Subjective Assessment - 09/30/14 0901    Subjective pt is having problems with her mouth sores since Monday, He has pain in ears and throught and tongue she has pain with swallowing.   She is going to see her family doctor today   She does not want to use the rollator.  She feels that her apartiment is to small and she is not ready to use and assistive device, not even a cane   Patient is accompained by: --  no interpreter came today   Pertinent History lung cancer diagnoes in July 2016 with metastsis to bone, brain, lung and left adrenal gland. PET scan 09/09/2014 revealed a large lesion in left acetabulum extending to left inferior pubic ramus. She also has lesions in lumbar spine and right humerus and manubrium    Currently  in Pain? Yes   Pain Score 5    Pain Location Mouth   Pain Type Acute pain   Aggravating Factors  eating , swalowing    Pain Relieving Factors nothing                          [facility] Adult PT Treatment/Exercise - 09/30/14 0001    Knee/Hip Exercises: Standing   Other Standing Knee Exercises glute sets   Knee/Hip Exercises: Seated   Marching AROM;Both;10 reps  verbal cues to keep core engaged   Abduction/Adduction  AROM;Both;10 reps   Knee/Hip Exercises: Supine   Quad Sets AAROM;Both;10 reps   Short Arc Quad Sets AROM;Both;2 sets;10 reps  3# on right leg, no weight on left leg    Other Supine Knee/Hip Exercises maintain hipand knee flexed/foot flat position while stabilzing core and moving arms   Knee/Hip Exercises: Sidelying   Hip ABduction AROM;10 reps   Hip ABduction Limitations has to lie on right side as too painful to lie on left side, pillow between knees    Clams 10 reps partial range   Other Sidelying Knee/Hip Exercises knee flexion/extension with pillow between knees.   Other Sidelying Knee/Hip Exercises glute sets   Ankle Exercises: Supine   Other Supine Ankle Exercises dorsi/plantar flexion   Ankle Exercises: Seated  Ankle Circles/Pumps 10 reps                        [facility] Goals - 09/30/14 1326    CC Long Term Goal  #1   Title Patient will be independent in a home exercise program   Status On-going   CC Long Term Goal  #2   Title pt will verbalize understanding of core stabalizations and ways to incorporate into daily activities for safety    Status On-going   CC Long Term Goal  #3   Title Pt will be able to walk for 5 minutes without stopping with assistive device as needed so that she will be able to walk in the community without another person nearby   Status On-going   CC Long Term Goal  #4   Title pt will be independent in sit to supine and return with minimal rest stops so she can be on her own at home.   Status  On-going   CC Long Term Goal  #5   Title pt will be able to complete 7 repetitions of sit to stand in 30 seconds demonstrating functional strength improvement    Status On-going            Plan - 09/30/14 1315    Clinical Impression Statement Pt refused to even try and assistive device.  She wants to continue to walk "on her own"  Her left leg appears to be a little stronger than last visit, but she has some pain along mid thigh after about 10 repetitions. Treament focusing on including core strengthening with extremtity activity Pt happy to have some exercise to do at home.   Clinical Impairments Affecting Rehab Potential lung cancer with lesions to brain, lung, bone and adrenal gland with large lesion at left pelvis and other bony areas.  Avoid added resistance to exercise   PT Next Visit Plan Review HEP. Emphasize core recruitment.  Work in standing for weight shift, strenghtneing and gait as she does not want to use an assistive device.  Continue strengthening with no added weight to left [place]   Consulted and Agree with Plan of Care Patient        Problem List Patient Active Problem List   Diagnosis Date Noted  . Non-small cell carcinoma of lung, stage 4 08/26/2014  . Bone disease, metabolic 86/75/4492  . Brain metastases   . Palliative care encounter   . Adrenal mass, left   . CAP (community acquired pneumonia) 08/07/2014  . Pneumonia 08/07/2014  . Lung mass 08/07/2014  . Hemoptysis 08/07/2014   Donato Heinz. Owens Shark, PT  09/30/2014, 1:29 PM  [place], [place], [address] Phone: [phone]   Fax:  [phone]

## 2014-10-01 ENCOUNTER — Ambulatory Visit: Payer: No Typology Code available for payment source | Admitting: Physical Therapy

## 2014-10-01 DIAGNOSIS — R269 Unspecified abnormalities of gait and mobility: Secondary | ICD-10-CM

## 2014-10-01 DIAGNOSIS — R29898 Other symptoms and signs involving the musculoskeletal system: Secondary | ICD-10-CM

## 2014-10-01 NOTE — Therapy (Signed)
[place], [place], [address] Phone: [phone]   Fax:  [phone]  Physical Therapy Treatment  Patient Details  Name: Deborah Foster MRN: [id number] Date of Birth: 1970/01/21 Referring Provider:  Thea Silversmith, MD  Encounter Date: 10/01/2014   10/01/14 1430  PT Visits / Re-Eval  Visit Number 3  Number of Visits 9  Date for PT Re-Evaluation 10/22/14  PT Time Calculation  PT Start Time 1101  PT Stop Time 1143  PT Time Calculation (min) 42 min  PT - End of Session  Activity Tolerance Patient tolerated treatment well (intemittent pain, that activity stopped )  Behavior During Therapy [facility] for tasks assessed/performed    Past Medical History  Diagnosis Date  . Cancer   . Pneumonia   . Hemoptysis   . Adrenal cancer   . Lung mass   . Hypokalemia     Past Surgical History  Procedure Laterality Date  . Video bronchoscopy Bilateral 08/09/2014    Procedure: VIDEO BRONCHOSCOPY WITHOUT FLUORO;  Surgeon: [name], MD;  Location: [facility];  Service: Cardiopulmonary;  Laterality: Bilateral;    There were no vitals filed for this visit.  Visit Diagnosis:   Left leg weakness     Gait abnormality         Subjective Assessment - 10/01/14 1104    Subjective No new complaints. Still with pain in mouth and left hip/pelvis. Did see her primary MD who gave her pain medicine that made her vomit/dizzy. She called him back and he reduced her dosage (from '5mg'$  to 2.5 mg). Has not tried this dosage as yet, waiting till she gets home in case of dizziness.   Currently in Pain? No/denies   Pain Score 4    Pain Location Other (Comment)  left hip/pelivs   Pain Orientation Left   Pain Descriptors / Indicators Aching;Sore   Pain Type Acute pain   Aggravating Factors  certain movements   Pain Relieving Factors nothing, has not tired heat/cryo and does not want to take medication due to side effects      Treatment: Reviewed HEP issued at last session with cues on correct ex form and to slow down for improved muscle activation/control.  Seated on large p-ball: cues on posture and ex form needed Pelvic rocks laterally and fwd/bwd x 10 each way Pelvic circles x 10 each way Alternating long arc quads (left leg with limited motion for pain free ranges) x 10 each leg Alternating marching (left leg limited ranges in pain free ranges) x 10 each leg  Standing: on treadmill for bil Ue support Hip abduction x 10 reps each leg Hip extension x 10 reps each leg Mini forward lunges x 10 each side Side stepping on treadmill both sides leading x 30 seconds at lowest speed, cues on posture and ex form.  Supine on mat: Short arc quads, bil legs, alternating 5 sec holds x 10 reps each leg Bridge (limited range for decreased pain) x 10 reps Straight leg raises left leg, AAROM, x 10 reps  Gait: on treadmill With UE support, x 2-3 minutes with emphasis on increased bil step length and equal stance time, mod to max cues needed with min guard assist and bil UE support.         [facility] Goals - 09/30/14 1326    CC Long Term Goal  #1   Title Patient will be independent in a home exercise program   Status  On-going   CC Long Term Goal  #2   Title pt will verbalize understanding of core stabalizations and ways to incorporate into daily activities for safety    Status On-going   CC Long Term Goal  #3   Title Pt will be able to walk for 5 minutes without stopping with assistive device as needed so that she will be able to walk in the community without another person nearby   Status On-going   CC Long Term Goal  #4   Title pt will be independent in sit to supine and return with minimal rest stops so she can be on her own at home.   Status On-going   CC Long Term Goal  #5   Title pt will be able to complete 7 repetitions of sit to stand in 30 seconds demonstrating functional strength improvement     Status On-going        10/01/14 2240  Plan  Clinical Impression Statement Focused on lower extremity strengthening and some gait training supported on treadmill (in hopes of showing pt that UE support can decrease weight and pain on legs with gait). No increase in pain after session, mild increase with some exercies that subsided when exercises completed. Pt making steady progress toward goals.                      Pt will benefit from skilled therapeutic intervention in order to improve on the following deficits Abnormal gait;Difficulty walking;Decreased safety awareness;Decreased endurance;Decreased activity tolerance;Decreased knowledge of precautions;Pain;Decreased knowledge of use of DME;Decreased strength  Rehab Potential Good  Clinical Impairments Affecting Rehab Potential lung cancer with lesions to brain, lung, bone and adrenal gland with large lesion at left pelvis and other bony areas.  Avoid added resistance to exercise  PT Frequency 2x / week  PT Duration 4 weeks  PT Treatment/Interventions ADLs/Self Care Home Management;Patient/family education;DME Instruction;Gait training;Stair training;Functional mobility training;Therapeutic activities;Therapeutic exercise;Orthotic Fit/Training;Neuromuscular re-education  PT Next Visit Plan Emphasize core recruitment.  Work in standing for weight shift, strengthening and gait as she does not want to use an assistive device.  Continue strengthening with no added weight to left [place]  Consulted and Agree with Plan of Care Patient      Problem List Patient Active Problem List   Diagnosis Date Noted  . Non-small cell carcinoma of lung, stage 4 08/26/2014  . Bone disease, metabolic 54/27/0623  . Brain metastases   . Palliative care encounter   . Adrenal mass, left   . CAP (community acquired pneumonia) 08/07/2014  . Pneumonia 08/07/2014  . Lung mass 08/07/2014  . Hemoptysis 08/07/2014    Deborah Foster 10/01/2014, 11:22 AM  Deborah Foster,  PTA, [facility] [address] [place], [place] [address] [phone] 10/02/2014, 10:45 PM

## 2014-10-04 ENCOUNTER — Ambulatory Visit: Payer: No Typology Code available for payment source | Admitting: Physical Therapy

## 2014-10-04 DIAGNOSIS — R269 Unspecified abnormalities of gait and mobility: Secondary | ICD-10-CM

## 2014-10-04 DIAGNOSIS — R29898 Other symptoms and signs involving the musculoskeletal system: Secondary | ICD-10-CM

## 2014-10-04 NOTE — Therapy (Signed)
[place], [place], [address] Phone: [phone]   Fax:  [phone]  Physical Therapy Treatment  Patient Details  Name: Deborah Foster MRN: [id number] Date of Birth: [birth year] Referring Provider:  Thea Silversmith, MD  Encounter Date: 10/04/2014      PT End of Session - 10/04/14 1727    Visit Number 4   Number of Visits 9   Date for PT Re-Evaluation 10/22/14   PT Start Time 0837   PT Stop Time 0926   PT Time Calculation (min) 49 min   Activity Tolerance Patient tolerated treatment well   Behavior During Therapy [facility] for tasks assessed/performed      Past Medical History  Diagnosis Date  . Cancer   . Pneumonia   . Hemoptysis   . Adrenal cancer   . Lung mass   . Hypokalemia     Past Surgical History  Procedure Laterality Date  . Video bronchoscopy Bilateral 08/09/2014    Procedure: VIDEO BRONCHOSCOPY WITHOUT FLUORO;  Surgeon: [name], MD;  Location: [facility];  Service: Cardiopulmonary;  Laterality: Bilateral;    There were no vitals filed for this visit.  Visit Diagnosis:  Left leg weakness  Gait abnormality      Subjective Assessment - 10/04/14 0837    Subjective Reports having a reaction to medicine for her lung, with face and neck itchiness, acne and pain, which is a little better than last time.  Doctor who prescribed medicine told her it would caues red dits and diarrhea.  Did decrease dose of pain medicine, and that helps.     Currently in Pain? Yes   Pain Score 2    Pain Location Neck  and left hip at 3/10, which comes and goes   Aggravating Factors  not therapy   Pain Relieving Factors rubbing on her thigh                         [facility] Adult PT Treatment/Exercise - 10/04/14 0001    Knee/Hip Exercises: Standing   Heel Raises Both;10 reps;3 sets  in treadmill with both UE support   Hip Abduction Right;Left;AROM;10 reps;3 sets  in treadmill with  both UE support   Lateral Step Up Left;10 reps;Step Height: 2";Hand Hold: 0  then 4 inch step x 10   Forward Step Up Left;Hand Hold: 0;Step Height: 2"  Right 4 inch x 10, 6" x 10; reported left leg pain   Step Down Right;10 reps;Hand Hold: 1;Step Height: 2"   Other Standing Knee Exercises sidestepping 25' x 2 each direction   Other Standing Knee Exercises standing with back to corner:  tandem stance x 30 seconds each with right leg in front and with left leg in front; standing on blue foam pad, then stepping in place on blue foam pad x approx. 1 minute, trying to avoid Trendelenberg   Knee/Hip Exercises: Seated   Long Arc Quad AROM;Right;Left;10 reps;3 sets  difficult on left   Marching AROM;Right;10 reps;Left  difficult and somewhat painful on left, ltd. ROM   Sit to Sand 10 reps;without UE support  leans to right when doing this   Shoulder Exercises: Standing   Other Standing Exercises wall pushups x 10 x 3                        Long Term Clinic Goals - 09/30/14 1326    CC Long Term  Goal  #1   Title Patient will be independent in a home exercise program   Status On-going   CC Long Term Goal  #2   Title pt will verbalize understanding of core stabalizations and ways to incorporate into daily activities for safety    Status On-going   CC Long Term Goal  #3   Title Pt will be able to walk for 5 minutes without stopping with assistive device as needed so that she will be able to walk in the community without another person nearby   Status On-going   CC Long Term Goal  #4   Title pt will be independent in sit to supine and return with minimal rest stops so she can be on her own at home.   Status On-going   CC Long Term Goal  #5   Title pt will be able to complete 7 repetitions of sit to stand in 30 seconds demonstrating functional strength improvement    Status On-going            Plan - 10/04/14 0924    Clinical Impression Statement Pain in left hip area is  about the same after exercise as before, but patient says it feel better because she has exercised; also reports pain 2/10 in right calf after exercise, "mostly tired."   Pt will benefit from skilled therapeutic intervention in order to improve on the following deficits Abnormal gait;Difficulty walking;Decreased safety awareness;Decreased endurance;Decreased activity tolerance;Decreased knowledge of precautions;Pain;Decreased knowledge of use of DME;Decreased strength   Rehab Potential Good   Clinical Impairments Affecting Rehab Potential lung cancer with lesions to brain, lung, bone and adrenal gland with large lesion at left pelvis and other bony areas.  Avoid added resistance to exercise   PT Frequency 2x / week   PT Duration 4 weeks   PT Treatment/Interventions Therapeutic exercise;Balance training   PT Next Visit Plan Continue work on strengthening of LEs with no added resistance or with body weight resistance to tolerance; continue balance training.   Consulted and Agree with Plan of Care Patient        Problem List Patient Active Problem List   Diagnosis Date Noted  . Non-small cell carcinoma of lung, stage 4 08/26/2014  . Bone disease, metabolic 07/26/1973  . Brain metastases   . Palliative care encounter   . Adrenal mass, left   . CAP (community acquired pneumonia) 08/07/2014  . Pneumonia 08/07/2014  . Lung mass 08/07/2014  . Hemoptysis 08/07/2014    SALISBURY,DONNA 10/04/2014, 5:31 PM  [place], [place], [address] Phone: [phone]   Fax:  Beverly, PT 10/04/2014 5:31 PM

## 2014-10-07 ENCOUNTER — Ambulatory Visit: Payer: No Typology Code available for payment source

## 2014-10-07 DIAGNOSIS — R29898 Other symptoms and signs involving the musculoskeletal system: Secondary | ICD-10-CM

## 2014-10-07 DIAGNOSIS — R269 Unspecified abnormalities of gait and mobility: Secondary | ICD-10-CM

## 2014-10-07 NOTE — Therapy (Signed)
[place], [place], [address] Phone: [phone]   Fax:  [phone]  Physical Therapy Treatment  Patient Details  Name: Deborah Foster MRN: [id number] Date of Birth: 01-19-1970 Referring Provider:  Thea Silversmith, MD  Encounter Date: 10/07/2014      PT End of Session - 10/07/14 0928    Visit Number 5   Number of Visits 9   Date for PT Re-Evaluation 10/22/14   PT Start Time 0842   PT Stop Time 0930   PT Time Calculation (min) 48 min   Activity Tolerance Patient tolerated treatment well   Behavior During Therapy [facility] for tasks assessed/performed      Past Medical History  Diagnosis Date  . Cancer   . Pneumonia   . Hemoptysis   . Adrenal cancer   . Lung mass   . Hypokalemia     Past Surgical History  Procedure Laterality Date  . Video bronchoscopy Bilateral 08/09/2014    Procedure: VIDEO BRONCHOSCOPY WITHOUT FLUORO;  Surgeon: [name], MD;  Location: [facility];  Service: Cardiopulmonary;  Laterality: Bilateral;    There were no vitals filed for this visit.  Visit Diagnosis:  Left leg weakness  Gait abnormality      Subjective Assessment - 10/07/14 0847    Subjective The back of my Rt hip is also hurting today in addition to my Lt upper leg/hip always hurting. But it's not too bad, just different pain than usual.    Currently in Pain? Yes   Pain Score 2    Pain Location Hip   Pain Orientation Left;Right   Pain Descriptors / Indicators Aching;Constant   Pain Type Chronic pain   Aggravating Factors  not sure in Rt leg, other pain is metastatic pain pain, when I sit too long   Pain Relieving Factors standing helps                         [facility] Adult PT Treatment/Exercise - 10/07/14 0001    Knee/Hip Exercises: Standing   Heel Raises Both;3 sets;10 reps  In // bars no UE support   Hip Flexion AROM;Right;Left;1 set;10 reps  In // bars   Hip Flexion  Limitations Started having increased anterior Lt hip pain so only 1 set of these   Forward Lunges Both;2 sets;2 seconds  Alternate lunging in // bars no UE support   Hip Abduction Right;Left;3 sets;10 reps  In // bars   Abduction Limitations More challenging on Lt side with tactile and verbal cuing required for decrease trunk compensations   Lateral Step Up Both;1 set;10 reps;Hand Hold: 2  4" step for both and hip hike 5 reps each after   Forward Step Up Both;1 set;10 reps;Hand Hold: 2  4" step for both in // bars   Step Down Both;2 sets;10 reps;Hand Hold: 2  In // bars on 4" step just heel tap   Other Standing Knee Exercises All in // bars: Standing on Airex for slow marching trying to avoid Trendelenburg 20 reps each [place]; then bil tandem stance still on Airex 2 sets of 30 seconds each, pt did very well with this. Next sidestepping bil no UE support 2 reps each in bars (12 ft each direction), then heel-toe walking front then abackwards 2 reps no UE support but unsteady with this;   Knee/Hip Exercises: Seated   Long Arc Quad AROM;Both;2 sets;10 reps;5 sets;Other (comment)  5 sec holds  Ball Squeeze 5 second holds 20 reps   Sit to [employer] with UE support;5 reps  Trying to not lean to Rt, increased pain today                        [place] - 10/07/14 0935    CC Long Term Goal  #1   Title Patient will be independent in a home exercise program   Status On-going   CC Long Term Goal  #2   Title pt will verbalize understanding of core stabalizations and ways to incorporate into daily activities for safety    Status On-going   CC Long Term Goal  #3   Title Pt will be able to walk for 5 minutes without stopping with assistive device as needed so that she will be able to walk in the community without another person nearby   Status On-going   CC Long Term Goal  #4   Title pt will be independent in sit to supine and return with minimal rest stops so she can be on her  own at home.   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 Sitting rest breaks prn during treatment but even then we were doing sitting exercises. She reported that her pain was "only a little bit worse after" 3/10. She is progressing very well with her tolerance of balance exercises.   Pt will benefit from skilled therapeutic intervention in order to improve on the following deficits Abnormal gait;Difficulty walking;Decreased safety awareness;Decreased endurance;Decreased activity tolerance;Decreased knowledge of precautions;Pain;Decreased knowledge of use of DME;Decreased strength   Rehab Potential Good   Clinical Impairments Affecting Rehab Potential lung cancer with lesions to brain, lung, bone and adrenal gland with large lesion at left pelvis and other bony areas.  Avoid added resistance to exercise   PT Frequency 2x / week   PT Duration 4 Elva Mauro   PT Treatment/Interventions Therapeutic exercise;Balance training   PT Next Visit Plan Continue work on strengthening of LEs with no added resistance or with body weight resistance to tolerance; continue balance training.   Consulted and Agree with Plan of Care Patient  Interpreter Phuong present for treatment        Problem List Patient Active Problem List   Diagnosis Date Noted  . Non-small cell carcinoma of lung, stage 4 08/26/2014  . Bone disease, metabolic 94/17/4081  . Brain metastases   . Palliative care encounter   . Adrenal mass, left   . CAP (community acquired pneumonia) 08/07/2014  . Pneumonia 08/07/2014  . Lung mass 08/07/2014  . Hemoptysis 08/07/2014    Otelia Limes, PTA 10/07/2014, 9:38 AM  [place], [place], [address] Phone: [phone]   Fax:  [phone]

## 2014-10-08 ENCOUNTER — Other Ambulatory Visit (HOSPITAL_BASED_OUTPATIENT_CLINIC_OR_DEPARTMENT_OTHER): Payer: No Typology Code available for payment source

## 2014-10-08 ENCOUNTER — Encounter: Payer: Self-pay | Admitting: Nurse Practitioner

## 2014-10-08 ENCOUNTER — Ambulatory Visit (HOSPITAL_BASED_OUTPATIENT_CLINIC_OR_DEPARTMENT_OTHER): Payer: No Typology Code available for payment source | Admitting: Nurse Practitioner

## 2014-10-08 ENCOUNTER — Telehealth: Payer: Self-pay | Admitting: Internal Medicine

## 2014-10-08 VITALS — BP 108/67 | HR 76 | Temp 98.1°F | Resp 18 | Ht 59.0 in | Wt 106.1 lb

## 2014-10-08 DIAGNOSIS — R21 Rash and other nonspecific skin eruption: Secondary | ICD-10-CM | POA: Insufficient documentation

## 2014-10-08 DIAGNOSIS — C3490 Malignant neoplasm of unspecified part of unspecified bronchus or lung: Secondary | ICD-10-CM | POA: Diagnosis not present

## 2014-10-08 LAB — CBC WITH DIFFERENTIAL/PLATELET
BASO%: 0.7 % (ref 0.0–2.0)
Basophils Absolute: 0 10*3/uL (ref 0.0–0.1)
EOS%: 1.3 % (ref 0.0–7.0)
Eosinophils Absolute: 0.1 10*3/uL (ref 0.0–0.5)
HCT: 37.7 % (ref 34.8–46.6)
HGB: 12.6 g/dL (ref 11.6–15.9)
LYMPH#: 1.2 10*3/uL (ref 0.9–3.3)
LYMPH%: 23.3 % (ref 14.0–49.7)
MCH: 30.7 pg (ref 25.1–34.0)
MCHC: 33.4 g/dL (ref 31.5–36.0)
MCV: 92 fL (ref 79.5–101.0)
MONO#: 0.5 10*3/uL (ref 0.1–0.9)
MONO%: 9.3 % (ref 0.0–14.0)
NEUT#: 3.3 10*3/uL (ref 1.5–6.5)
NEUT%: 65.4 % (ref 38.4–76.8)
Platelets: 438 10*3/uL — ABNORMAL HIGH (ref 145–400)
RBC: 4.09 10*6/uL (ref 3.70–5.45)
RDW: 16.1 % — ABNORMAL HIGH (ref 11.2–14.5)
WBC: 5 10*3/uL (ref 3.9–10.3)

## 2014-10-08 LAB — COMPREHENSIVE METABOLIC PANEL (CC13)
ALT: 11 U/L (ref 0–55)
AST: 17 U/L (ref 5–34)
Albumin: 3.1 g/dL — ABNORMAL LOW (ref 3.5–5.0)
Alkaline Phosphatase: 192 U/L — ABNORMAL HIGH (ref 40–150)
Anion Gap: 6 mEq/L (ref 3–11)
CHLORIDE: 107 meq/L (ref 98–109)
CO2: 29 meq/L (ref 22–29)
CREATININE: 0.6 mg/dL (ref 0.6–1.1)
Calcium: 8.3 mg/dL — ABNORMAL LOW (ref 8.4–10.4)
EGFR: >90 mL/min/{1.73_m2} (ref >90)
Glucose: 89 mg/dl (ref 70–140)
POTASSIUM: 3.4 meq/L — AB (ref 3.5–5.1)
SODIUM: 141 meq/L (ref 136–145)
Total Bilirubin: 0.62 mg/dL (ref 0.20–1.20)
Total Protein: 6 g/dL — ABNORMAL LOW (ref 6.4–8.3)

## 2014-10-08 MED ORDER — CLINDAMYCIN PHOSPHATE 1 % EX GEL
Freq: Two times a day (BID) | CUTANEOUS | Status: DC
Start: 2014-10-08 — End: 2015-02-02

## 2014-10-08 NOTE — Telephone Encounter (Signed)
Appointments made and avs printed for patient °

## 2014-10-08 NOTE — Progress Notes (Signed)
[place] [phone]   [phone]  OFFICE PROGRESS NOTE  [name], MD [place] [address]  DIAGNOSIS: Stage IV (T2a, N1, M1 B) non-small cell lung cancer, adenocarcinoma presented with right middle lobe lung mass in addition to right hilar adenopathy as well as metastatic disease to the bone, brain as well as left adrenal metastasis diagnosed in July 2016  PRIOR THERAPY: The patient completed palliative radiotherapy to the brain as well as metastatic bone lesions in the pelvis on 09/07/14  CURRENT THERAPY: Tarceva 171m daily   INTERVAL HISTORY: Deborah WIEGMAN44y.o. female returns to the clinic today for hospital follow-up visit accompanied by her interpreter. The patient started tarceva 2 weeks ago. So far she is tolerating this relatively well. Her main complaint is a pruritic acneform rash to her face. She has been applying OTC hydrocortisone cream, which soothes the itching for a few hours, but it always returns. She tries not to scratch it. She denies fevers, chills, nausea, or vomiting. She had a few episodes of diarrhea managed with imodium. She is eating and drinking well. She denies shortness of breath, chest pain, cough, or hemoptysis. She continues to work with physical therapy for the pain and weakness to her left leg. She has numbness from the left thigh down the knee.   MEDICAL HISTORY: Past Medical History  Diagnosis Date  . Cancer   . Pneumonia   . Hemoptysis   . Adrenal cancer   . Lung mass   . Hypokalemia     ALLERGIES:  has No Known Allergies.  MEDICATIONS:  Current Outpatient Prescriptions  Medication Sig Dispense Refill  . erlotinib (TARCEVA) 150 MG tablet Take 1 tablet (150 mg total) by mouth daily. Take on an empty stomach 1 hour before meals or 2 hours after. 30 tablet 2  . morphine 10 MG/5ML solution TAKE 5ML BY MOUTH EVERY 4 HOURS AS NEEDED FOR PAIN  0  . UNABLE TO FIND Take 650  mg by mouth. Med Name:corduceps sinesis    . acyclovir (ZOVIRAX) 400 MG tablet Take 1 tablet (400 mg total) by mouth 3 (three) times daily. (Patient not taking: Reported on 10/08/2014) 30 tablet 0  . albuterol (PROVENTIL HFA;VENTOLIN HFA) 108 (90 BASE) MCG/ACT inhaler Inhale 2 puffs into the lungs every 6 (six) hours as needed for wheezing or shortness of breath. (Patient not taking: Reported on 08/31/2014) 1 Inhaler 2  . clindamycin (CLINDAGEL) 1 % gel Apply topically 2 (two) times daily. 30 g 1  . dexamethasone (DECADRON) 4 MG tablet Take 1 tablet (4 mg total) by mouth 2 (two) times daily. (Patient not taking: Reported on 09/30/2014) 60 tablet 0  . fluconazole (DIFLUCAN) 100 MG tablet Take 1 tablet (100 mg total) by mouth daily. (Patient not taking: Reported on 09/09/2014) 8 tablet 0  . HYDROcodone-acetaminophen (NORCO) 10-325 MG per tablet Take 1 tablet by mouth every 6 (six) hours as needed for moderate pain or severe pain. (Patient not taking: Reported on 08/31/2014) 30 tablet 0  . Misc Natural Products (TURMERIC CURCUMIN) CAPS Take 750 capsules by mouth.    . ondansetron (ZOFRAN) 4 MG tablet Take 1 tablet (4 mg total) by mouth every 8 (eight) hours as needed for nausea or vomiting. (Patient not taking: Reported on 09/07/2014) 30 tablet 0  . potassium chloride SA (K-DUR,KLOR-CON) 20 MEQ tablet Take 2 tablets (40 mEq total) by mouth daily. (Patient not taking: Reported on 10/08/2014)  20 tablet 0   No current facility-administered medications for this visit.    SURGICAL HISTORY:  Past Surgical History  Procedure Laterality Date  . Video bronchoscopy Bilateral 08/09/2014    Procedure: VIDEO BRONCHOSCOPY WITHOUT FLUORO;  Surgeon: [name], MD;  Location: [facility];  Service: Cardiopulmonary;  Laterality: Bilateral;    REVIEW OF SYSTEMS:  Constitutional: positive for fatigue Eyes: negative Ears, nose, mouth, throat, and face: negative Respiratory: negative Cardiovascular:  negative Gastrointestinal: negative Genitourinary:negative Integument/breast: positive for rash Hematologic/lymphatic: negative Musculoskeletal:positive for bone pain and muscle weakness Neurological: negative Behavioral/Psych: negative Endocrine: negative Allergic/Immunologic: negative   PHYSICAL EXAMINATION: General appearance: alert, cooperative, fatigued and no distress Head: Normocephalic, without obvious abnormality, atraumatic Neck: no adenopathy, no JVD, supple, symmetrical, trachea midline and thyroid not enlarged, symmetric, no tenderness/mass/nodules Lymph nodes: Cervical, supraclavicular, and axillary nodes normal. Resp: clear to auscultation bilaterally Back: symmetric, no curvature. ROM normal. No CVA tenderness. Cardio: regular rate and rhythm, S1, S2 normal, no murmur, click, rub or gallop GI: soft, non-tender; bowel sounds normal; no masses,  no organomegaly Extremities: extremities normal, atraumatic, no cyanosis or edema Neurologic: Alert and oriented X 3, normal strength and tone. Normal symmetric reflexes. Normal coordination and gait Skin: acneform erythematous rash to face  ECOG PERFORMANCE STATUS: 1 - Symptomatic but completely ambulatory  Blood pressure 108/67, pulse 76, temperature 98.1 F (36.7 C), temperature source Oral, resp. rate 18, height $RemoveBe'4\' 11"'DfJABObkk$  (1.499 m), weight 106 lb 1.6 oz (48.127 kg), SpO2 100 %.  LABORATORY DATA: Lab Results  Component Value Date   WBC 5.0 10/08/2014   HGB 12.6 10/08/2014   HCT 37.7 10/08/2014   MCV 92.0 10/08/2014   PLT 438* 10/08/2014      Chemistry      Component Value Date/Time   NA 141 10/08/2014 1250   NA 135 08/17/2014 0553   K 3.4* 10/08/2014 1250   K 3.2* 08/17/2014 0553   CL 96* 08/17/2014 0553   CO2 29 10/08/2014 1250   CO2 26 08/17/2014 0553   BUN <4.0* 10/08/2014 1250   BUN <5* 08/17/2014 0553   CREATININE 0.6 10/08/2014 1250   CREATININE 0.41* 08/17/2014 0553      Component Value Date/Time    CALCIUM 8.3* 10/08/2014 1250   CALCIUM 8.4* 08/17/2014 0553   ALKPHOS 192* 10/08/2014 1250   ALKPHOS 145* 08/08/2014 0458   AST 17 10/08/2014 1250   AST 16 08/08/2014 0458   ALT 11 10/08/2014 1250   ALT 14 08/08/2014 0458   BILITOT 0.62 10/08/2014 1250   BILITOT 0.5 08/08/2014 0458       RADIOGRAPHIC STUDIES: Nm Pet Image Initial (pi) Skull Base To Thigh  09/09/2014   CLINICAL DATA:  Initial treatment strategy for lung carcinoma. Non-small cell lung cancer  EXAM: NUCLEAR MEDICINE PET SKULL BASE TO THIGH  TECHNIQUE: 6.3 mCi F-18 FDG was injected intravenously. Full-ring PET imaging was performed from the skull base to thigh after the radiotracer. CT data was obtained and used for attenuation correction and anatomic localization.  FASTING BLOOD GLUCOSE:  Value: 69 mg/dl  COMPARISON:  CT 08/07/2014  FINDINGS: NECK  Subtle hypermetabolic activity in the LEFT temporal lobe corresponds to large metastasis on comparison brain MRI.  CHEST  Hypermetabolic nodule in the RIGHT infrahilar lung adjacent to the bronchus intermedius with SUV max equal 11.7. There is a hypermetabolic mass in the RIGHT lower lobe measuring 3.5 cm with SUV max equal 15.3. There is peripheral postobstructive pneumonitis in the RIGHT lower lobe.  Hypermetabolic RIGHT lower paratracheal lymph node on image 65.  ABDOMEN/PELVIS  Enlargement of the LEFT adrenal gland to 5 cm with SUV max equal 16.8.  No abnormal metabolic activity in the liver. No hypermetabolic abdominal or pelvic lymph nodes.  SKELETON  Widespread lytic hypermetabolic skeletal metastasis. Large lesion in the LEFT acetabulum extending into the LEFT inferior pubic ramus with expansile soft tissue component and intense metabolic activity (SUV max 11.6). Additional smaller lesions are present within sacrum and lumbar spine (L1-L2). Additional skeletal metastasis within the RIGHT humeral head and manubrium. The medial clavicular heads are also involved.  IMPRESSION: 1.  Hypermetabolic RIGHT lower lobe mass and RIGHT infrahilar nodule consists with primary bronchogenic carcinoma. 2. Local hypermetabolic nodal metastasis to the ipsilateral mediastinum. 3. Distant soft tissue metastasis to the LEFT adrenal gland and brain. 4. Skeletal metastasis involving the pelvis, sacrum, lumbar spine, RIGHT humerus, and manubrium.   Electronically Signed   By: Suzy Bouchard M.D.   On: 09/09/2014 09:29    ASSESSMENT AND PLAN: This is a very pleasant 44 years old Asian female never smoker with recently diagnosed with stage IV non-small cell lung cancer, adenocarcinoma presented with right middle lobe lung mass in addition to right hilar adenopathy and metastatic disease to the bones, brain and left adrenal gland.  The patient tested positive for an EGFR mutation. Therefore, she will be able to avoid chemotherapy and is eligible for tyrosine kinase inhibitor therapy.  She was started on Tarceva 124m daily in early September 2016. She tolerates this well, besides a pruritic acneform face rash.  Studies show that this rash in patients on tarceva was a positive prognostic factor due to evidence of successful EGFR inhibition.  Treatment for this rash include topical steroids and antibiotic ointments.  I have sent in a prescription for clindamycin 1% gel to apply twice daily.  This may make her facial dryness worse, so I advised her to apply a sensitive skin moisturizer afterwards such as cerave or cetaphil.  She will avoid sun exposure and use sunscreen daily The patient will return in 2 weeks for follow up.  She was advised to call immediately if she has any concerning symptoms in the interval. The patient voices understanding of current disease status and treatment options and is in agreement with the current care plan.  All questions were answered. The patient knows to call the clinic with any problems, questions or concerns. We can certainly see the patient much sooner if  necessary.  HLaurie Panda NP 10/08/2014

## 2014-10-11 ENCOUNTER — Ambulatory Visit: Payer: No Typology Code available for payment source

## 2014-10-11 DIAGNOSIS — R269 Unspecified abnormalities of gait and mobility: Secondary | ICD-10-CM

## 2014-10-11 DIAGNOSIS — R29898 Other symptoms and signs involving the musculoskeletal system: Secondary | ICD-10-CM

## 2014-10-11 NOTE — Therapy (Signed)
[place], [place], [address] Phone: [phone]   Fax:  [phone]  Physical Therapy Treatment  Patient Details  Name: Deborah Foster MRN: [id number] Date of Birth: [birth year] Referring Provider:  Thea Silversmith, MD  Encounter Date: 10/11/2014      PT End of Session - 10/11/14 0933    Visit Number 6   Number of Visits 9   Date for PT Re-Evaluation 10/22/14   PT Start Time 0852   PT Stop Time 0934   PT Time Calculation (min) 42 min   Activity Tolerance Patient tolerated treatment well   Behavior During Therapy [facility] for tasks assessed/performed      Past Medical History  Diagnosis Date  . Cancer   . Pneumonia   . Hemoptysis   . Adrenal cancer   . Lung mass   . Hypokalemia     Past Surgical History  Procedure Laterality Date  . Video bronchoscopy Bilateral 08/09/2014    Procedure: VIDEO BRONCHOSCOPY WITHOUT FLUORO;  Surgeon: [name], MD;  Location: [facility];  Service: Cardiopulmonary;  Laterality: Bilateral;    There were no vitals filed for this visit.  Visit Diagnosis:  Left leg weakness  Gait abnormality      Subjective Assessment - 10/11/14 0857    Subjective My Lt hip is bothering me some today but not horribly.    Currently in Pain? Yes   Pain Score 2    Pain Location Hip   Pain Orientation Left   Pain Descriptors / Indicators Aching   Aggravating Factors  just always hurts some (from mets)   Pain Relieving Factors Moving is better than sitting for too long                         [facility] Adult PT Treatment/Exercise - 10/11/14 0001    Knee/Hip Exercises: Aerobic   Nustep Level 3 for 5 minutes to warm up pt in pain free range as she reported hurting some this morning and this felt good.   Knee/Hip Exercises: Standing   Heel Raises Both;2 sets;10 reps  In // bars standing on fitter   Hip Abduction Stengthening;Both;2 sets;10 reps  In // bars  standing on fitter   Lateral Step Up Both;10 reps;Hand Hold: 0;2 sets  Used fitter today for step ups, seated rest between sets   Forward Step Up Both;10 reps;Hand Hold: 0;2 sets  Used fitter today for step ups   Step Down --  Too painful today so did not do   Other Standing Knee Exercises All in // bars: Standing on Fitter for slow march 20 reps each and then Tandem stance Rt lead, then Lt 1 min each; next heel-toe gait 3 sets no UE support required, standing on fitter for hip hiking on Rt then Lt 10 reps each with tactile cuing for correct technique/assist with full motion; bil sidestepping in mini squat position 2 sets each direction. then backwards heel-toe 2 sets no UE support   Knee/Hip Exercises: Seated   Ball Squeeze 20 reps, slow squeeze   Marching AROM;Both;10 reps                        Long Term Clinic Goals - 10/07/14 0935    CC Long Term Goal  #1   Title Patient will be independent in a home exercise program   Status On-going   CC Long Term Goal  #  2   Title pt will verbalize understanding of core stabalizations and ways to incorporate into daily activities for safety    Status On-going   CC Long Term Goal  #3   Title Pt will be able to walk for 5 minutes without stopping with assistive device as needed so that she will be able to walk in the community without another person nearby   Status On-going   CC Long Term Goal  #4   Title pt will be independent in sit to supine and return with minimal rest stops so she can be on her own at home.   Status On-going   CC Long Term Goal  #5   Title pt will be able to complete 7 repetitions of sit to stand in 30 seconds demonstrating functional strength improvement    Status On-going            Plan - 10/11/14 0936    Clinical Impression Statement Pt did very well today and her Rt side pain was gone that she was experiencing last week. An her Lt side pain was decreased to a 2/10 today and unchanged after session.  She very minimally needed UE support with balance activites today tolerating everything very well.    Pt will benefit from skilled therapeutic intervention in order to improve on the following deficits Abnormal gait;Difficulty walking;Decreased safety awareness;Decreased endurance;Decreased activity tolerance;Decreased knowledge of precautions;Pain;Decreased knowledge of use of DME;Decreased strength   Rehab Potential Good   Clinical Impairments Affecting Rehab Potential lung cancer with lesions to brain, lung, bone and adrenal gland with large lesion at left pelvis and other bony areas.  Avoid added resistance to exercise   PT Frequency 2x / week   PT Duration 4 weeks   PT Treatment/Interventions Therapeutic exercise;Balance training   PT Next Visit Plan Assess goals next visit; Continue work on strengthening of LEs with no added resistance or with body weight resistance to tolerance; continue balance training.   Consulted and Agree with Plan of Care Patient  Interpreter present        Problem List Patient Active Problem List   Diagnosis Date Noted  . Rash 10/08/2014  . Non-small cell carcinoma of lung, stage 4 08/26/2014  . Bone disease, metabolic 91/47/8295  . Brain metastases   . Palliative care encounter   . Adrenal mass, left   . CAP (community acquired pneumonia) 08/07/2014  . Pneumonia 08/07/2014  . Lung mass 08/07/2014  . Hemoptysis 08/07/2014    Otelia Limes, PTA 10/11/2014, 9:40 AM  [place], [place], [address] Phone: [phone]   Fax:  [phone]

## 2014-10-13 ENCOUNTER — Ambulatory Visit: Payer: No Typology Code available for payment source | Admitting: Physical Therapy

## 2014-10-13 DIAGNOSIS — R269 Unspecified abnormalities of gait and mobility: Secondary | ICD-10-CM

## 2014-10-13 DIAGNOSIS — R29898 Other symptoms and signs involving the musculoskeletal system: Secondary | ICD-10-CM

## 2014-10-13 NOTE — Therapy (Signed)
[place], [place], [address] Phone: [phone]   Fax:  [phone]  Physical Therapy Treatment  Patient Details  Name: Deborah Foster MRN: [id number] Date of Birth: 04/30/1970 Referring Provider:  Thea Silversmith, MD  Encounter Date: 10/13/2014      PT End of Session - 10/13/14 1017    Visit Number 7   Number of Visits 9   Date for PT Re-Evaluation 10/22/14   PT Start Time 0934   PT Stop Time 1016   PT Time Calculation (min) 42 min   Activity Tolerance Patient tolerated treatment well   Behavior During Therapy [facility] for tasks assessed/performed      Past Medical History  Diagnosis Date  . Cancer   . Pneumonia   . Hemoptysis   . Adrenal cancer   . Lung mass   . Hypokalemia     Past Surgical History  Procedure Laterality Date  . Video bronchoscopy Bilateral 08/09/2014    Procedure: VIDEO BRONCHOSCOPY WITHOUT FLUORO;  Surgeon: [name], MD;  Location: [place];  Service: Cardiopulmonary;  Laterality: Bilateral;    There were no vitals filed for this visit.  Visit Diagnosis:  Left leg weakness  Gait abnormality      Subjective Assessment - 10/13/14 0932    Subjective "I'm fine."  Says through interpreter how effective therapy is, that it has made her better able to lift her leg.   Currently in Pain? Yes   Pain Score 2    Pain Location Leg   Pain Orientation Upper;Anterior;Posterior   Pain Descriptors / Indicators Dull   Aggravating Factors  constant in thigh, but it comes and goes some   Pain Relieving Factors nothing                         [facility] Adult PT Treatment/Exercise - 10/13/14 0001    Knee/Hip Exercises: Aerobic   Nustep Level 3 for 7 minutes to warm up pt in pain free range as she reported hurting some this morning and this felt good.   Knee/Hip Exercises: Standing   Heel Raises Both;3 sets;10 reps  In // bars no UE support   Hip Abduction  Stengthening;Both;10 reps;3 sets  In // bars standing on fitter   Lateral Step Up Both;10 reps;Hand Hold: 0;2 sets  Used fitter today for step ups, seated rest between sets   Forward Step Up Right;Left;10 reps;Hand Hold: 0  on Fitter with four soft legs   Step Down --  Too painful today so did not do   Other Standing Knee Exercises All in // bars: Standing on Fitter for slow march 20 reps each and then Tandem stance Rt lead, then Lt 1 min each; next heel-toe gait 3 sets no UE support required, standing on fitter for hip hiking on Rt then Lt 10 reps each with tactile cuing for correct technique/assist with full motion; bil sidestepping in mini squat position 2 sets each direction. then backwards heel-toe 2 sets no UE support; mini squats x 10 x 2; single leg stance x 30 seconds x 2 each leg, no hands.                PT Education - 10/13/14 1017    Education provided Yes   Education Details okay to do some of this exercise (like in clinic) at home, as long as she stays safe   Person(s) Educated Patient   Methods Explanation  Comprehension Verbalized understanding                Long Term Clinic Goals - 10/13/14 1020    CC Long Term Goal  #1   Title Patient will be independent in a home exercise program   Status On-going   CC Long Term Goal  #2   Title pt will verbalize understanding of core stabalizations and ways to incorporate into daily activities for safety    Status On-going   CC Long Term Goal  #3   Title Pt will be able to walk for 5 minutes without stopping with assistive device as needed so that she will be able to walk in the community without another person nearby   Status On-going   CC Long Term Goal  #4   Title pt will be independent in sit to supine and return with minimal rest stops so she can be on her own at home.   Status On-going   CC Long Term Goal  #5   Title pt will be able to complete 7 repetitions of sit to stand in 30 seconds demonstrating  functional strength improvement    Status On-going            Plan - 10/13/14 1018    Clinical Impression Statement Did well again today with all activity.  Had occasional sitting breaks between exercises due to fatigue and some left leg pain, but she is quickly ready to get back up and work some more.  patient commented that she tries to do what she remembers of these activities at home; she was admonished to be sure she is safe when doing this.   Pt will benefit from skilled therapeutic intervention in order to improve on the following deficits Abnormal gait;Difficulty walking;Decreased safety awareness;Decreased endurance;Decreased activity tolerance;Decreased knowledge of precautions;Pain;Decreased knowledge of use of DME;Decreased strength   Rehab Potential Good   Clinical Impairments Affecting Rehab Potential lung cancer with lesions to brain, lung, bone and adrenal gland with large lesion at left pelvis and other bony areas.  Avoid added resistance to exercise   PT Frequency 2x / week   PT Duration 4 weeks   PT Treatment/Interventions Therapeutic exercise;Balance training   PT Next Visit Plan Assess goals next visit; Continue work on strengthening of LEs with no added resistance or with body weight resistance to tolerance; continue balance training.   PT Home Exercise Plan do some of clinic activities in a safe setting at home   Consulted and Agree with Plan of Care Patient  with interpreter        Problem List Patient Active Problem List   Diagnosis Date Noted  . Rash 10/08/2014  . Non-small cell carcinoma of lung, stage 4 08/26/2014  . Bone disease, metabolic 32/44/0102  . Brain metastases   . Palliative care encounter   . Adrenal mass, left   . CAP (community acquired pneumonia) 08/07/2014  . Pneumonia 08/07/2014  . Lung mass 08/07/2014  . Hemoptysis 08/07/2014    SALISBURY,DONNA 10/13/2014, 10:22 AM  [place], [place], [address] Phone: [phone]   Fax:  [place], PT 10/13/2014 10:22 AM

## 2014-10-14 ENCOUNTER — Ambulatory Visit
Admission: RE | Admit: 2014-10-14 | Discharge: 2014-10-14 | Disposition: A | Discharge status: Home / Self Care | Payer: No Typology Code available for payment source | Source: Ambulatory Visit | Attending: Radiation Oncology | Admitting: Radiation Oncology

## 2014-10-14 VITALS — BP 110/76 | HR 104 | Temp 97.9°F | Resp 20 | Wt 104.0 lb

## 2014-10-14 DIAGNOSIS — C3490 Malignant neoplasm of unspecified part of unspecified bronchus or lung: Secondary | ICD-10-CM

## 2014-10-14 DIAGNOSIS — C7931 Secondary malignant neoplasm of brain: Secondary | ICD-10-CM

## 2014-10-14 NOTE — Progress Notes (Signed)
Department of Radiation Oncology  Phone:  [phone] Fax:        [phone]   Name: Deborah Foster MRN: [id number]  DOB: [birth year]  Date: 10/14/2014  Follow Up Visit Note  Diagnosis: Non-small cell carcinoma of lung, stage 4   Staging form: Lung, AJCC 7th Edition     Clinical stage from 08/26/2014: Stage IV (T2a, N1, M1b) - Signed by [name], MD on 08/26/2014   Summary and Interval since last radiation: 1 month The patient's radiation treatment dates extended from 08/23/2014-09/07/2014. The site and dose used includes the brain, right chest and left hip treated to 30 Gy in 12 fractions. The beams and energy used includes the Brain with opposed laterals and reduced fields, chest and hip were treated AP/PA. Mixed energies were used with 6, 10 and 15 MV photons.   Interval History: Deborah Foster presents today for routine follow-up appointment with radiation oncology in regards to the management of her Non-small cell carcinoma of lung, stage 4. She confirms symptoms of diarrhea, but this symptom has not occurred in the past two weeks. She has a facial rash for which she has been prescribed a topical cream which helps. She also has some pain in her left buttock/leg but this is better since RT and she takes no pain medications. She feels PT was helpful.  She would like the results of her PET scan which she does not know.  She also had questions about foods. She does not have an MRI scheduled yet. Her hair is coming back.   Physical Exam: The patient is alert and oriented. There is no significant changes to the status of overall health to be noted at this time. Filed Vitals:   10/14/14 1416  BP: 110/76  Pulse: 104  Temp: 97.9 F (36.6 C)  Resp: 20  Weight: 104 lb (47.174 kg)    IMPRESSION: Deborah Foster is a 44 y.o. female presenting to clinic in regards to her Non-small cell carcinoma of lung, stage 4. The patient is managing her reported symptoms appropriately. She understood the translator  clearly. The patient understands the results of her most recent PET scan. The patient understands that in the future, she may require additional radiation therapy treatments. The patient understands that more scans are to take place to manage her disease. She understands that the pain of her pelvis is due to the location of her cancer. The patient understands that she can access her appointments and medical records via [place].  PLAN: The patient is advised to continue the administration of her medication as previously instructed. Over the counter medications were recommended based on the patients reported symptoms of abdominal bloating.Gas X, Pepcid or prilosec  and ginger products were also recommended to alleviate abdominal bloating.   We went over her PET scan and discussed these results.   We will order an MRI scan now and then in 3 months. The best contact phone number is [phone] to call about scheduling the MRI.  All vocalized questions and concerns have been addressed. If the patient develops any further questions or concerns in regards to her treatment and recovery, she has been encouraged to contact Dr. Pablo Ledger, MD.    This document serves as a record of services personally performed by Thea Silversmith , MD. It was created on her behalf by Lenn Cal, a trained medical scribe. The creation of this record is based on the scribe's personal observations and the provider's statements to them. This document has been checked and  approved by the attending provider.   ------------------------------------------------  Thea Silversmith, MD

## 2014-10-18 ENCOUNTER — Ambulatory Visit: Payer: No Typology Code available for payment source | Attending: Radiation Oncology

## 2014-10-18 DIAGNOSIS — R269 Unspecified abnormalities of gait and mobility: Secondary | ICD-10-CM | POA: Diagnosis present

## 2014-10-18 DIAGNOSIS — R29898 Other symptoms and signs involving the musculoskeletal system: Secondary | ICD-10-CM | POA: Insufficient documentation

## 2014-10-18 NOTE — Patient Instructions (Signed)
Bridging    Slowly raise buttocks from floor, keeping stomach tight. Repeat _10___ times per set. Do __1-2__ sets per session. Do ___2_ sessions per day.  http://orth.exer.us/1097   Copyright  VHI. All rights reserved.   Pelvic Tilt    Flatten back by tightening stomach muscles and buttocks. Repeat _10___ times per set. Do __1-2__ sets per session. Do _2___ sessions per day.  http://orth.exer.us/135   Copyright  VHI. All rights reserved.   Issued paper copies as epic [employer] right now.

## 2014-10-18 NOTE — Therapy (Signed)
[place], [place], [address] Phone: [phone]   Fax:  [phone]  Physical Therapy Treatment  Patient Details  Name: Deborah Foster MRN: [id number] Date of Birth: 1970-05-08 Referring Provider:  Thea Silversmith, MD  Encounter Date: 10/18/2014      PT End of Session - 10/18/14 1032    Visit Number 8   Number of Visits 9   Date for PT Re-Evaluation 10/22/14   PT Start Time 0851   PT Stop Time 0938   PT Time Calculation (min) 47 min   Activity Tolerance Patient tolerated treatment well   Behavior During Therapy [facility] for tasks assessed/performed      Past Medical History  Diagnosis Date  . Cancer   . Pneumonia   . Hemoptysis   . Adrenal cancer   . Lung mass   . Hypokalemia   . Radiation 08/23/14-09/07/14    Brain/chest and left hip 30 Gy 12 Fx    Past Surgical History  Procedure Laterality Date  . Video bronchoscopy Bilateral 08/09/2014    Procedure: VIDEO BRONCHOSCOPY WITHOUT FLUORO;  Surgeon: [name], MD;  Location: [facility];  Service: Cardiopulmonary;  Laterality: Bilateral;    There were no vitals filed for this visit.  Visit Diagnosis:  Left leg weakness  Gait abnormality      Subjective Assessment - 10/18/14 0900    Subjective Been struggling with some indigestion, already spoke to doctor about this. My Lt hip is doing okay, just hurts some now. And I have a HA today, not sure why.   Currently in Pain? Yes   Pain Score 2    Pain Location Hip   Pain Orientation Left;Posterior   Pain Descriptors / Indicators Sharp   Aggravating Factors  back of my hip hurts from cancer, its better than what it was   Pain Relieving Factors exercises/moving helps                         [facility] Adult PT Treatment/Exercise - 10/18/14 0001    Knee/Hip Exercises: Aerobic   Nustep Level 3 for 7 minutes to warm up pt in pain free range as she reported hurting some this  morning and this felt good.   Knee/Hip Exercises: Standing   Forward Step Up Both;2 sets;10 reps;Hand Hold: 0  Used fitter with 4 soft legs   Forward Step Up Limitations Seated rest between sets   Other Standing Knee Exercises All in // bars : Heel-toe walking front and then backwards 2 sets each; slow high knee marching 2 sets; sidestepping in mini squat position 2 sets each                PT Education - 10/18/14 1031    Education provided Yes   Education Details Began instructing in core exercises including bridges and pelvic tilts   Person(s) Educated Patient;Other (comment)  Interpreter present   Methods Explanation;Demonstration;Handout;Tactile cues   Comprehension Verbalized understanding;Returned demonstration;Need further instruction                [facility] Goals - 10/18/14 0920    CC Long Term Goal  #1   Title Patient will be independent in a home exercise program  Pt has been incorporating balance activities at home   Status Partially Met   CC Long Term Goal  #2   Title pt will verbalize understanding of core stabalizations and ways to incorporate into  daily activities for safety   Issued HEP for this today   Status Partially Met   CC Long Term Goal  #3   Title Pt will be able to walk for 5 minutes without stopping with assistive device as needed so that she will be able to walk in the community without another person nearby  Able to walk for 10 mins before needing rest   Status Achieved   CC Long Term Goal  #4   Title pt will be independent in sit to supine and return with minimal rest stops so she can be on her own at home.  Still difficult but able to do independently now   Status Achieved   CC Long Term Goal  #5   Title pt will be able to complete 7 repetitions of sit to stand in 30 seconds demonstrating functional strength improvement   Pt able to perform 10 reps without UE support!   Status Achieved            Plan - 10/18/14 1032     Clinical Impression Statement Pt is tolerating her balance exercises very well. Began instructing in core exercises per plan pf care and pt tolerated well but will need further instruction in technique. She is very eager to learn new exercises and requested having more printouts at home.     Pt will benefit from skilled therapeutic intervention in order to improve on the following deficits Abnormal gait;Difficulty walking;Decreased safety awareness;Decreased endurance;Decreased activity tolerance;Decreased knowledge of precautions;Pain;Decreased knowledge of use of DME;Decreased strength   Rehab Potential Good   Clinical Impairments Affecting Rehab Potential lung cancer with lesions to brain, lung, bone and adrenal gland with large lesion at left pelvis and other bony areas.  Avoid added resistance to exercise   PT Frequency 2x / week   PT Duration 4 weeks   PT Treatment/Interventions Therapeutic exercise;Balance training   PT Next Visit Plan Cont core exercises and issue balance activities handouts so pt can cont after therapy. Continue work on strengthening of LEs with no added resistance or with body weight resistance to tolerance; continue balance training.   Consulted and Agree with Plan of Care Patient        Problem List Patient Active Problem List   Diagnosis Date Noted  . Rash 10/08/2014  . Non-small cell carcinoma of lung, stage 4 ([place]) 08/26/2014  . Bone disease, metabolic 53/66/4403  . Brain metastases ([place])   . Palliative care encounter   . Adrenal mass, left ([place])   . CAP (community acquired pneumonia) 08/07/2014  . Pneumonia 08/07/2014  . Lung mass 08/07/2014  . Hemoptysis 08/07/2014    Otelia Limes, PTA 10/18/2014, 10:44 AM  [place] [place], [place], [address] Phone: [phone]   Fax:  [phone]

## 2014-10-20 ENCOUNTER — Ambulatory Visit: Payer: No Typology Code available for payment source | Admitting: Physical Therapy

## 2014-10-20 DIAGNOSIS — R29898 Other symptoms and signs involving the musculoskeletal system: Secondary | ICD-10-CM | POA: Diagnosis not present

## 2014-10-20 DIAGNOSIS — R269 Unspecified abnormalities of gait and mobility: Secondary | ICD-10-CM

## 2014-10-20 NOTE — Patient Instructions (Signed)
   Knee Fold   Lie on back, legs bent, arms by sides. Exhale, lifting knee to chest. Inhale, returning. Keep abdominals flat, navel to spine. Repeat __10__ times, alternating legs. Do __2__ sessions per day.  Copyright  VHI. All rights reserved.  Knee Drop   Keep pelvis stable. Without rotating hips, slowly drop knee to side, pause, return to center, bring knee across midline toward opposite hip. Feel obliques engaging. Repeat for ___10_ times each leg    Copyright  VHI. All rights reserved.  Heel Slide to Straight   Slide one leg down to straight. Return. Be sure pelvis does not rock forward, tilt, rotate, or tip to side. Do _10__ times. Restabilize pelvis. Repeat with other leg. Do __1-2_ sets, __2_ times per day.  http://ss.exer.us/16   Copyright  VHI. All rights reserved.

## 2014-10-20 NOTE — Therapy (Signed)
[place], [place], [address] Phone: [phone]   Fax:  [phone]  Physical Therapy Treatment  Patient Details  Name: Deborah Foster MRN: [id number] Date of Birth: 10-Jan-1971 Referring Provider:  Thea Silversmith, MD  Encounter Date: 10/20/2014      PT End of Session - 10/20/14 0935    Visit Number 9   Number of Visits 17   Date for PT Re-Evaluation 11/22/14   PT Start Time 0845   PT Stop Time 0930   PT Time Calculation (min) 45 min   Activity Tolerance Patient tolerated treatment well   Behavior During Therapy [facility] for tasks assessed/performed      Past Medical History  Diagnosis Date  . Cancer   . Pneumonia   . Hemoptysis   . Adrenal cancer   . Lung mass   . Hypokalemia   . Radiation 08/23/14-09/07/14    Brain/chest and left hip 30 Gy 12 Fx    Past Surgical History  Procedure Laterality Date  . Video bronchoscopy Bilateral 08/09/2014    Procedure: VIDEO BRONCHOSCOPY WITHOUT FLUORO;  Surgeon: [name], MD;  Location: [facility];  Service: Cardiopulmonary;  Laterality: Bilateral;    There were no vitals filed for this visit.  Visit Diagnosis:  Left leg weakness  Gait abnormality      Subjective Assessment - 10/20/14 0850    Subjective Pt is hanving pain in left side of throat.  She is on antibiotics for that , but doesn't remember the name of it.  she goes back to see her doctor on Friday about this    Pertinent History lung cancer diagnoes in July 2016 with metastsis to bone, brain, lung and left adrenal gland. PET scan 09/09/2014 revealed a large lesion in left acetabulum extending to left inferior pubic ramus. She also has lesions in lumbar spine and right humerus and manubrium    Currently in Pain? Yes   Pain Score 5    Pain Location Throat   Pain Orientation Left   Pain Descriptors / Indicators Sharp   Pain Type Acute pain   Pain Onset In the past 7 days             [facility] PT Assessment - 10/20/14 0001    Sit to Stand   Comments 11   Strength   Right Hip Flexion 4/5   Right Hip ABduction 3+/5   Left Hip Flexion 3-/5   Left Hip ABduction 3/5   Right Knee Flexion 4+/5   Right Knee Extension 4+/5   Left Knee Flexion 3/5   Left Knee Extension 3-/5   Right Ankle Dorsiflexion 4+/5   Right Ankle Plantar Flexion 4+/5   Left Ankle Dorsiflexion 4+/5   Left Ankle Plantar Flexion 4+/5                     [facility] Adult PT Treatment/Exercise - 10/20/14 0001    Knee/Hip Exercises: Aerobic   Nustep level 3 for 6 min   Knee/Hip Exercises: Standing   Heel Raises 10 reps   Knee Flexion Both;10 reps   Hip Abduction Both;10 reps   Other Standing Knee Exercises standing at counter with finget taps on cabinets to reach laterally and up for weight shift and balance    Knee/Hip Exercises: Seated   Long Arc Quad Both;10 reps   Marching Both;10 reps   Knee/Hip Exercises: Supine   Short Arc Quad Sets 10 reps   Heel  Slides 10 reps   Other Supine Knee/Hip Exercises knee drops   Other Supine Knee/Hip Exercises bridges   Knee/Hip Exercises: Sidelying   Hip ABduction 10 reps;Both   Clams 10 reps with left leg in right [facility] Goals - 10/20/14 1232    CC Long Term Goal  #1   Title Patient will be independent in a home exercise program  pt has basic home exercise and is doing balance in daily activities    Time 4   Period Weeks   Status Partially Met   CC Long Term Goal  #2   Title pt will verbalize understanding of core stabalizations and ways to incorporate into daily activities for safety   continues to needs cues for core stabalization    Time 4   Period Weeks   Status Partially Met   CC Long Term Goal  #3   Title Pt will be able to walk for 5 minutes without stopping with assistive device as needed so that she will be able to walk in the community without another person nearby    Status Achieved   CC Long Term Goal  #4   Title pt will be independent in sit to supine and return with minimal rest stops so she can be on her own at home.   Status Achieved   CC Long Term Goal  #5   Title pt will be able to complete 14  repetitions of sit to stand in 30 seconds demonstrating functional strength improvement   11   Status Revised   CC Long Term Goal  #6   Title pt will have 4/5 muscle strength in left hip abduction and extension so that she will perform daily activites with greater ease    Time 4   Period Weeks   Status New   Additional Goals   Additional Goals Yes            Plan - 10/20/14 0936    Clinical Impression Statement Ms Nied has made signifcant progress in strength over the last month, but continues to have weakness especailly in hip abductors extensors and flexors. She continues to walk without an assistive device but her [place] characteristic is not as great. She would benefit from at least 4 more weeks of therapy so she can contine to increase strength and functional improvment at home.    Pt will benefit from skilled therapeutic intervention in order to improve on the following deficits Abnormal gait;Difficulty walking;Decreased safety awareness;Decreased endurance;Decreased activity tolerance;Decreased knowledge of precautions;Pain;Decreased knowledge of use of DME;Decreased strength   Clinical Impairments Affecting Rehab Potential lung cancer with lesions to brain, lung, bone and adrenal gland with large lesion at left pelvis and other bony areas.  Avoid added resistance to exercise   PT Frequency 2x / week   PT Duration 4 weeks   PT Treatment/Interventions Therapeutic exercise;Balance training;Gait training   PT Next Visit Plan Cont core exercises and  balance activities Continue work on strengthening of LEs with no added resistance or with body weight resistance to tolerance; continue balance training. Upgrade HEP as indicated    Consulted and  Agree with Plan of Care Patient        Problem List Patient Active Problem List   Diagnosis Date Noted  . Rash 10/08/2014  . Non-small cell carcinoma of lung, stage 4 (  [place]) 08/26/2014  . Bone disease, metabolic 74/82/7078  . Brain metastases ([place])   . Palliative care encounter   . Adrenal mass, left ([place])   . CAP (community acquired pneumonia) 08/07/2014  . Pneumonia 08/07/2014  . Lung mass 08/07/2014  . Hemoptysis 08/07/2014   Donato Heinz. Owens Shark, PT   10/20/2014, 12:42 PM  [place], [place], [address] Phone: [phone]   Fax:  [phone]

## 2014-10-21 ENCOUNTER — Ambulatory Visit (HOSPITAL_COMMUNITY)
Admission: RE | Admit: 2014-10-21 | Discharge: 2014-10-21 | Disposition: A | Discharge status: Home / Self Care | Payer: No Typology Code available for payment source | Source: Ambulatory Visit | Attending: Radiation Oncology | Admitting: Radiation Oncology

## 2014-10-21 ENCOUNTER — Other Ambulatory Visit: Payer: Self-pay | Admitting: Medical Oncology

## 2014-10-21 DIAGNOSIS — C3490 Malignant neoplasm of unspecified part of unspecified bronchus or lung: Secondary | ICD-10-CM

## 2014-10-22 ENCOUNTER — Other Ambulatory Visit (HOSPITAL_BASED_OUTPATIENT_CLINIC_OR_DEPARTMENT_OTHER): Payer: No Typology Code available for payment source

## 2014-10-22 ENCOUNTER — Other Ambulatory Visit: Payer: Self-pay | Admitting: *Deleted

## 2014-10-22 ENCOUNTER — Encounter: Payer: Self-pay | Admitting: Nurse Practitioner

## 2014-10-22 ENCOUNTER — Ambulatory Visit (HOSPITAL_BASED_OUTPATIENT_CLINIC_OR_DEPARTMENT_OTHER): Payer: No Typology Code available for payment source | Admitting: Nurse Practitioner

## 2014-10-22 VITALS — BP 110/59 | HR 90 | Temp 97.7°F | Resp 18 | Ht 59.0 in | Wt 104.1 lb

## 2014-10-22 DIAGNOSIS — C7931 Secondary malignant neoplasm of brain: Secondary | ICD-10-CM | POA: Diagnosis not present

## 2014-10-22 DIAGNOSIS — C3490 Malignant neoplasm of unspecified part of unspecified bronchus or lung: Secondary | ICD-10-CM

## 2014-10-22 DIAGNOSIS — K5900 Constipation, unspecified: Secondary | ICD-10-CM

## 2014-10-22 DIAGNOSIS — R21 Rash and other nonspecific skin eruption: Secondary | ICD-10-CM

## 2014-10-22 DIAGNOSIS — C7972 Secondary malignant neoplasm of left adrenal gland: Secondary | ICD-10-CM | POA: Diagnosis not present

## 2014-10-22 DIAGNOSIS — C342 Malignant neoplasm of middle lobe, bronchus or lung: Secondary | ICD-10-CM | POA: Diagnosis not present

## 2014-10-22 DIAGNOSIS — C7951 Secondary malignant neoplasm of bone: Secondary | ICD-10-CM | POA: Diagnosis not present

## 2014-10-22 DIAGNOSIS — K1230 Oral mucositis (ulcerative), unspecified: Secondary | ICD-10-CM

## 2014-10-22 LAB — COMPREHENSIVE METABOLIC PANEL (CC13)
AST: 15 U/L (ref 5–34)
Albumin: 3.3 g/dL — ABNORMAL LOW (ref 3.5–5.0)
Alkaline Phosphatase: 121 U/L (ref 40–150)
Anion Gap: 8 mEq/L (ref 3–11)
BILIRUBIN TOTAL: 1.12 mg/dL (ref 0.20–1.20)
BUN: 4.4 mg/dL — ABNORMAL LOW (ref 7.0–26.0)
CHLORIDE: 107 meq/L (ref 98–109)
CO2: 25 meq/L (ref 22–29)
CREATININE: 0.6 mg/dL (ref 0.6–1.1)
Calcium: 8.7 mg/dL (ref 8.4–10.4)
GLUCOSE: 86 mg/dL (ref 70–140)
Potassium: 3.9 mEq/L (ref 3.5–5.1)
SODIUM: 140 meq/L (ref 136–145)
TOTAL PROTEIN: 6.5 g/dL (ref 6.4–8.3)

## 2014-10-22 LAB — CBC WITH DIFFERENTIAL/PLATELET
BASO%: 0.7 % (ref 0.0–2.0)
BASOS ABS: 0 10*3/uL (ref 0.0–0.1)
EOS ABS: 0.1 10*3/uL (ref 0.0–0.5)
EOS%: 2.6 % (ref 0.0–7.0)
HCT: 40 % (ref 34.8–46.6)
HGB: 13.4 g/dL (ref 11.6–15.9)
LYMPH%: 17.3 % (ref 14.0–49.7)
MCH: 31 pg (ref 25.1–34.0)
MCHC: 33.4 g/dL (ref 31.5–36.0)
MCV: 93 fL (ref 79.5–101.0)
MONO#: 0.5 10*3/uL (ref 0.1–0.9)
MONO%: 8.4 % (ref 0.0–14.0)
NEUT%: 71 % (ref 38.4–76.8)
NEUTROS ABS: 4 10*3/uL (ref 1.5–6.5)
Platelets: 452 10*3/uL — ABNORMAL HIGH (ref 145–400)
RBC: 4.31 10*6/uL (ref 3.70–5.45)
RDW: 15.7 % — AB (ref 11.2–14.5)
WBC: 5.6 10*3/uL (ref 3.9–10.3)
lymph#: 1 10*3/uL (ref 0.9–3.3)

## 2014-10-22 MED ORDER — FLUCONAZOLE 100 MG PO TABS: 100.0000 mg | ORAL_TABLET | Freq: Every day | ORAL | Status: DC

## 2014-10-22 MED ORDER — DOXYCYCLINE HYCLATE 100 MG PO TABS: 100.0000 mg | ORAL_TABLET | Freq: Every day | ORAL | Status: DC

## 2014-10-22 NOTE — Progress Notes (Signed)
[place] Telephone:([phone]) [phone]   Fax:([phone]) [phone]  OFFICE PROGRESS NOTE  [name], MD [place] [address]  DIAGNOSIS: Stage IV (T2a, N1, M1 B) non-small cell lung cancer, adenocarcinoma presented with right middle lobe lung mass in addition to right hilar adenopathy as well as metastatic disease to the bone, brain as well as left adrenal metastasis diagnosed in July 2016  PRIOR THERAPY: The patient completed palliative radiotherapy to the brain as well as metastatic bone lesions in the pelvis on 09/07/14  CURRENT THERAPY: Tarceva 181m daily   INTERVAL HISTORY: HKATHLYNE LOUD44y.o. female returns to the clinic today for hospital follow-up visit accompanied by her interpreter. The patient is on week 4 of tarceva. She rarely has diarrhea, and actually has had constipation for the past 2 days. She is not eating or drinking well because of thrush and mouth sores. She ran out of fluconazole. She has been using moisturizers and clindamycin gel to her face for the pruritic acneform rash to her face. This is improving some what, but there are still large papules to her nose and lower chin. She has occasional nausea managed with zofran. She denies fevers or chills. She denies shortness of breath or chest pain, but has a productive cough at night mostly. The pain and weakness to her left leg is almost completely resolved. She does have some mild lower back pain, however.  MEDICAL HISTORY: Past Medical History  Diagnosis Date  . Cancer   . Pneumonia   . Hemoptysis   . Adrenal cancer   . Lung mass   . Hypokalemia   . Radiation 08/23/14-09/07/14    Brain/chest and left hip 30 Gy 12 Fx    ALLERGIES:  has No Known Allergies.  MEDICATIONS:  Current Outpatient Prescriptions  Medication Sig Dispense Refill  . clindamycin (CLINDAGEL) 1 % gel Apply topically 2 (two) times daily. 30 g 1  . erlotinib (TARCEVA) 150 MG tablet Take 1 tablet (150 mg  total) by mouth daily. Take on an empty stomach 1 hour before meals or 2 hours after. 30 tablet 2  . Misc Natural Products (TURMERIC CURCUMIN) CAPS Take 750 capsules by mouth.    . ondansetron (ZOFRAN) 4 MG tablet Take 1 tablet (4 mg total) by mouth every 8 (eight) hours as needed for nausea or vomiting. 30 tablet 0  . acyclovir (ZOVIRAX) 400 MG tablet Take 1 tablet (400 mg total) by mouth 3 (three) times daily. (Patient not taking: Reported on 10/08/2014) 30 tablet 0  . albuterol (PROVENTIL HFA;VENTOLIN HFA) 108 (90 BASE) MCG/ACT inhaler Inhale 2 puffs into the lungs every 6 (six) hours as needed for wheezing or shortness of breath. (Patient not taking: Reported on 08/31/2014) 1 Inhaler 2  . doxycycline (VIBRA-TABS) 100 MG tablet Take 1 tablet (100 mg total) by mouth daily. 10 tablet 0  . fluconazole (DIFLUCAN) 100 MG tablet Take 1 tablet (100 mg total) by mouth daily. 8 tablet 0  . HYDROcodone-acetaminophen (NORCO) 10-325 MG per tablet Take 1 tablet by mouth every 6 (six) hours as needed for moderate pain or severe pain. (Patient not taking: Reported on 08/31/2014) 30 tablet 0  . morphine 10 MG/5ML solution TAKE 5ML BY MOUTH EVERY 4 HOURS AS NEEDED FOR PAIN  0  . potassium chloride SA (K-DUR,KLOR-CON) 20 MEQ tablet Take 2 tablets (40 mEq total) by mouth daily. (Patient not taking: Reported on 10/08/2014) 20 tablet 0  . UNABLE TO  FIND Take 650 mg by mouth. Med Name:corduceps sinesis     No current facility-administered medications for this visit.    SURGICAL HISTORY:  Past Surgical History  Procedure Laterality Date  . Video bronchoscopy Bilateral 08/09/2014    Procedure: VIDEO BRONCHOSCOPY WITHOUT FLUORO;  Surgeon: [name], MD;  Location: [facility];  Service: Cardiopulmonary;  Laterality: Bilateral;    REVIEW OF SYSTEMS:  Constitutional: positive for fatigue Eyes: negative Ears, nose, mouth, throat, and face: positive for thrush Respiratory: negative Cardiovascular:  negative Gastrointestinal: positive for constipation Genitourinary:negative Integument/breast: positive for rash Hematologic/lymphatic: negative Musculoskeletal:positive for bone pain and muscle weakness Neurological: negative Behavioral/Psych: negative Endocrine: negative Allergic/Immunologic: negative   PHYSICAL EXAMINATION: General appearance: alert, cooperative, fatigued and no distress Head: Normocephalic, without obvious abnormality, atraumatic Neck: no adenopathy, no JVD, supple, symmetrical, trachea midline and thyroid not enlarged, symmetric, no tenderness/mass/nodules Lymph nodes: Cervical, supraclavicular, and axillary nodes normal. Resp: clear to auscultation bilaterally Back: symmetric, no curvature. ROM normal. No CVA tenderness. Cardio: regular rate and rhythm, S1, S2 normal, no murmur, click, rub or gallop GI: soft, non-tender; bowel sounds normal; no masses,  no organomegaly Extremities: extremities normal, atraumatic, no cyanosis or edema Neurologic: Alert and oriented X 3, normal strength and tone. Normal symmetric reflexes. Normal coordination and gait Skin: acneform erythematous rash to face  ECOG PERFORMANCE STATUS: 1 - Symptomatic but completely ambulatory  Blood pressure 110/59, pulse 90, temperature 97.7 F (36.5 C), temperature source Oral, resp. rate 18, height _0  (1.499 m), weight 104 lb 1.6 oz (47.219 kg), SpO2 99 %.  LABORATORY DATA: Lab Results  Component Value Date   WBC 5.6 10/22/2014   HGB 13.4 10/22/2014   HCT 40.0 10/22/2014   MCV 93.0 10/22/2014   PLT 452* 10/22/2014      Chemistry      Component Value Date/Time   NA 140 10/22/2014 1018   NA 135 08/17/2014 0553   K 3.9 10/22/2014 1018   K 3.2* 08/17/2014 0553   CL 96* 08/17/2014 0553   CO2 25 10/22/2014 1018   CO2 26 08/17/2014 0553   BUN 4.4* 10/22/2014 1018   BUN <5* 08/17/2014 0553   CREATININE 0.6 10/22/2014 1018   CREATININE 0.41* 08/17/2014 0553      Component Value  Date/Time   CALCIUM 8.7 10/22/2014 1018   CALCIUM 8.4* 08/17/2014 0553   ALKPHOS 121 10/22/2014 1018   ALKPHOS 145* 08/08/2014 0458   AST 15 10/22/2014 1018   AST 16 08/08/2014 0458   ALT <9 10/22/2014 1018   ALT 14 08/08/2014 0458   BILITOT 1.12 10/22/2014 1018   BILITOT 0.5 08/08/2014 0458       RADIOGRAPHIC STUDIES: No results found.  ASSESSMENT AND PLAN: This is a very pleasant 44 years old Asian female never smoker with recently diagnosed with stage IV non-small cell lung cancer, adenocarcinoma presented with right middle lobe lung mass in addition to right hilar adenopathy and metastatic disease to the bones, brain and left adrenal gland.  The patient tested positive for an EGFR mutation. Therefore, she will be able to avoid chemotherapy and is eligible for tyrosine kinase inhibitor therapy.  She was started on Tarceva 160m daily in early September 2016. She tolerates this well, besides a pruritic acneform face rash.  She will avoid sun exposure and use sunscreen daily. She will continue moisturizing regularly.  Dr. MJulien Nordmannalso suggested trying doxycycline 1081mfor 10 days. For her mouth sores and thrush, I have prescribed magic mouth wash  and she has fluconazole refills to take daily.  I advised she use stool softeners daily for her constipation, and mucinex daily for her recurrent phlegm at night. The patient will in 1 month for follow up with Dr. Julien Nordmann  She was advised to call immediately if she has any concerning symptoms in the interval. The patient voices understanding of current disease status and treatment options and is in agreement with the current care plan.  All questions were answered. The patient knows to call the clinic with any problems, questions or concerns. We can certainly see the patient much sooner if necessary.  Laurie Panda, NP 10/22/2014

## 2014-10-26 ENCOUNTER — Ambulatory Visit: Payer: No Typology Code available for payment source

## 2014-10-26 DIAGNOSIS — R269 Unspecified abnormalities of gait and mobility: Secondary | ICD-10-CM

## 2014-10-26 DIAGNOSIS — R29898 Other symptoms and signs involving the musculoskeletal system: Secondary | ICD-10-CM

## 2014-10-26 NOTE — Therapy (Signed)
[place], [place], [address] Phone: [phone]   Fax:  [phone]  Physical Therapy Treatment  Patient Details  Name: Deborah Foster MRN: [id number] Date of Birth: 05/11/1970 Referring Provider:  Thea Silversmith, MD  Encounter Date: 10/26/2014      PT End of Session - 10/26/14 1059    Visit Number 10   Number of Visits 17   Date for PT Re-Evaluation 11/22/14   PT Start Time 1019   PT Stop Time 1101   PT Time Calculation (min) 42 min   Activity Tolerance Patient tolerated treatment well   Behavior During Therapy [facility] for tasks assessed/performed      Past Medical History  Diagnosis Date  . Cancer ([place])   . Pneumonia   . Hemoptysis   . Adrenal cancer ([place])   . Lung mass   . Hypokalemia   . Radiation 08/23/14-09/07/14    Brain/chest and left hip 30 Gy 12 Fx    Past Surgical History  Procedure Laterality Date  . Video bronchoscopy Bilateral 08/09/2014    Procedure: VIDEO BRONCHOSCOPY WITHOUT FLUORO;  Surgeon: [name], MD;  Location: [facility];  Service: Cardiopulmonary;  Laterality: Bilateral;    There were no vitals filed for this visit.  Visit Diagnosis:  Left leg weakness  Gait abnormality      Subjective Assessment - 10/26/14 1024    Subjective Lt hip doing alot better, not hurting right now. Have a cough that started about a week ago, but it's getting better. I feel so much stronger, the work in the bars has really helped my balance feel better. I don't feel so wobbly when I lift my legs when walking now.    Currently in Pain? No/denies                         [facility] Adult PT Treatment/Exercise - 10/26/14 0001    Knee/Hip Exercises: Stretches   Active Hamstring Stretch Both;3 reps;20 seconds  Seated in chair   Piriformis Stretch Right;3 reps;20 seconds  Painful on Lt, did not do   Knee/Hip Exercises: Aerobic   Nustep Level 4 x6 minutes   Knee/Hip  Exercises: Standing   Hip Abduction Stengthening;Both;2 sets;10 reps   Forward Step Up Both;2 sets;10 reps;Hand Hold: 0  On fitter with 4 legs, hip ext with contralateral, then flex   Forward Step Up Limitations In // bars   Step Down 1 set;Both;10 reps;Hand Hold: 2  On fitter with 4 legs, no pain with this today!   Other Standing Knee Exercises In // bars: Bil tandem stance on fitter board with 4 legs x1 minute each; bil sidestepping holding mini squat 2 sets each., heel-toe walking with eyes closed and fingertips on bars lightly., and then [place] bil 2 sets each, slow high knee marching 4 sets no UE support. Bil SLS x30 seconds each no UE support with this either.                        [facility] Goals - 10/20/14 1232    CC Long Term Goal  #1   Title Patient will be independent in a home exercise program  pt has basic home exercise and is doing balance in daily activities    Time 4   Period Weeks   Status Partially Met   CC Long Term Goal  #2   Title pt will  verbalize understanding of core stabalizations and ways to incorporate into daily activities for safety   continues to needs cues for core stabalization    Time 4   Period Weeks   Status Partially Met   CC Long Term Goal  #3   Title Pt will be able to walk for 5 minutes without stopping with assistive device as needed so that she will be able to walk in the community without another person nearby   Status Achieved   CC Long Term Goal  #4   Title pt will be independent in sit to supine and return with minimal rest stops so she can be on her own at home.   Status Achieved   CC Long Term Goal  #5   Title pt will be able to complete 14  repetitions of sit to stand in 30 seconds demonstrating functional strength improvement   11   Status Revised   CC Long Term Goal  #6   Title pt will have 4/5 muscle strength in left hip abduction and extension so that she will perform daily activites with greater ease     Time 4   Period Weeks   Status New   Additional Goals   Additional Goals Yes            Plan - 10/26/14 1109    Clinical Impression Statement Ms Chauca came in today with no Lt hip pain and though still with an antalgic gait, it was very minimal compared to what it has been in the past. She tolerated all standing therapy today without pain. She reports the standing exercises have been most beneficial   Pt will benefit from skilled therapeutic intervention in order to improve on the following deficits Abnormal gait;Difficulty walking;Decreased safety awareness;Decreased endurance;Decreased activity tolerance;Decreased knowledge of precautions;Pain;Decreased knowledge of use of DME;Decreased strength   Rehab Potential Good   Clinical Impairments Affecting Rehab Potential lung cancer with lesions to brain, lung, bone and adrenal gland with large lesion at left pelvis and other bony areas.  Avoid added resistance to exercise   PT Frequency 2x / week   PT Duration 4 weeks   PT Treatment/Interventions Therapeutic exercise;Balance training;Gait training   PT Next Visit Plan Cont core exercises and  balance activities Continue work on strengthening of LEs with no added resistance or with body weight resistance to tolerance; continue balance training. Upgrade HEP as indicated    Consulted and Agree with Plan of Care Patient        Problem List Patient Active Problem List   Diagnosis Date Noted  . Mucositis 10/22/2014  . Rash 10/08/2014  . Non-small cell carcinoma of lung, stage 4 ([place]) 08/26/2014  . Bone disease, metabolic 35/00/9381  . Brain metastases ([place])   . Palliative care encounter   . Adrenal mass, left ([place])   . CAP (community acquired pneumonia) 08/07/2014  . Pneumonia 08/07/2014  . Lung mass 08/07/2014  . Hemoptysis 08/07/2014    Otelia Limes, PTA 10/26/2014, 11:16 AM  [place], [place], [address] Phone: [phone]   Fax:  [phone]

## 2014-10-28 ENCOUNTER — Encounter: Payer: Self-pay | Admitting: Physical Therapy

## 2014-10-28 ENCOUNTER — Ambulatory Visit: Payer: No Typology Code available for payment source | Admitting: Physical Therapy

## 2014-10-28 DIAGNOSIS — R269 Unspecified abnormalities of gait and mobility: Secondary | ICD-10-CM

## 2014-10-28 DIAGNOSIS — R29898 Other symptoms and signs involving the musculoskeletal system: Secondary | ICD-10-CM | POA: Diagnosis not present

## 2014-10-28 NOTE — Therapy (Signed)
[place], [place], [address] Phone: [phone]   Fax:  [phone]  Physical Therapy Treatment  Patient Details  Name: Deborah Foster MRN: [id number] Date of Birth: [birth year] Referring Provider:  Thea Silversmith, MD  Encounter Date: 10/28/2014      PT End of Session - 10/28/14 0955    Visit Number 11   Number of Visits 17   Date for PT Re-Evaluation 11/22/14   PT Start Time 0932   PT Stop Time 1015   PT Time Calculation (min) 43 min   Activity Tolerance Patient tolerated treatment well   Behavior During Therapy [facility] for tasks assessed/performed      Past Medical History  Diagnosis Date  . Cancer ([place])   . Pneumonia   . Hemoptysis   . Adrenal cancer ([place])   . Lung mass   . Hypokalemia   . Radiation 08/23/14-09/07/14    Brain/chest and left hip 30 Gy 12 Fx    Past Surgical History  Procedure Laterality Date  . Video bronchoscopy Bilateral 08/09/2014    Procedure: VIDEO BRONCHOSCOPY WITHOUT FLUORO;  Surgeon: [name], MD;  Location: [facility];  Service: Cardiopulmonary;  Laterality: Bilateral;    There were no vitals filed for this visit.  Visit Diagnosis:  Left leg weakness  Gait abnormality      Subjective Assessment - 10/28/14 0936    Subjective Doing alot better.  No c/o pain today.  Sometimes my low back hurts but not today.   Patient is accompained by: Interpreter   Pertinent History lung cancer diagnoes in July 2016 with metastsis to bone, brain, lung and left adrenal gland. PET scan 09/09/2014 revealed a large lesion in left acetabulum extending to left inferior pubic ramus. She also has lesions in lumbar spine and right humerus and manubrium    Limitations Sitting;Standing;Walking   Currently in Pain? No/denies            [facility] Adult PT Treatment/Exercise - 10/28/14 0001    Knee/Hip Exercises: Aerobic   Nustep Level 4 x8 minutes whil discussing HEP   Knee/Hip  Exercises: Standing   Heel Raises Both;10 reps;2 sets   Hip Flexion AROM;[place];Both  Marching with knees extended and then flexed   Hip Abduction Stengthening;Both;2 sets;10 reps   Lateral Step Up Both;1 set;10 reps;Hand Hold: 0;Step Height: 4"   Forward Step Up Both;2 sets;10 reps;Hand Hold: 0  On fitter with 4 legs, hip ext with contralateral, then flex   Forward Step Up Limitations In // bars   Step Down 1 set;Both;10 reps;Hand Hold: 0  On fitter with 4 legs, no pain with this today!   Functional Squat 2 sets;10 reps  Using // bars for safety   Other Standing Knee Exercises In // bars with PT verbal cues for safety and technique: tandem stance x1 minutes each; tandem walking forward and backward; side stepping   Other Standing Knee Exercises In //bars on fitter: single leg stance doing hamstring curls x10 BLE, SLS moving other hip into flexion, abduction and extension x5 each leg; eyes closed feet together; SLS on fitter x1 minute           [facility] Goals - 10/20/14 1232    CC Long Term Goal  #1   Title Patient will be independent in a home exercise program  pt has basic home exercise and is doing balance in daily activities    Time 4   Period Weeks  Status Partially Met   CC Long Term Goal  #2   Title pt will verbalize understanding of core stabalizations and ways to incorporate into daily activities for safety   continues to needs cues for core stabalization    Time 4   Period Weeks   Status Partially Met   CC Long Term Goal  #3   Title Pt will be able to walk for 5 minutes without stopping with assistive device as needed so that she will be able to walk in the community without another person nearby   Status Achieved   CC Long Term Goal  #4   Title pt will be independent in sit to supine and return with minimal rest stops so she can be on her own at home.   Status Achieved   CC Long Term Goal  #5   Title pt will be able to complete 14  repetitions of sit  to stand in 30 seconds demonstrating functional strength improvement   11   Status Revised   CC Long Term Goal  #6   Title pt will have 4/5 muscle strength in left hip abduction and extension so that she will perform daily activites with greater ease    Time 4   Period Weeks   Status New   Additional Goals   Additional Goals Yes            Plan - 10/28/14 1005    Clinical Impression Statement Mrs West is doing quite well.  Her balance appears to be significantly improved compared to previous documentation.  Her pain is well controlled rihgt now and she is tolerating increased exercise.  She will benefit from continued PT to reduce fracture risk and further strengthen her legs for improved function and increased ability to care for her children and do daily tasks.   Pt will benefit from skilled therapeutic intervention in order to improve on the following deficits Abnormal gait;Difficulty walking;Decreased safety awareness;Decreased endurance;Decreased activity tolerance;Decreased knowledge of precautions;Pain;Decreased knowledge of use of DME;Decreased strength   Clinical Impairments Affecting Rehab Potential lung cancer with lesions to brain, lung, bone and adrenal gland with large lesion at left pelvis and other bony areas.  Avoid added resistance to exercise   PT Frequency 2x / week   PT Duration 4 weeks   PT Treatment/Interventions Therapeutic exercise;Balance training;Gait training   PT Next Visit Plan Continue strengthening and balance exercises for improved function   Consulted and Agree with Plan of Care Patient        Problem List Patient Active Problem List   Diagnosis Date Noted  . Mucositis 10/22/2014  . Rash 10/08/2014  . Non-small cell carcinoma of lung, stage 4 ([place]) 08/26/2014  . Bone disease, metabolic 01/75/1025  . Brain metastases ([place])   . Palliative care encounter   . Adrenal mass, left ([place])   . CAP (community acquired pneumonia) 08/07/2014  . Pneumonia  08/07/2014  . Lung mass 08/07/2014  . Hemoptysis 08/07/2014    Annia Friendly, PT 10/28/2014 11:03 AM   [place], [place], [address] Phone: [phone]   Fax:  [phone]

## 2014-10-29 ENCOUNTER — Ambulatory Visit (HOSPITAL_COMMUNITY)
Admission: RE | Admit: 2014-10-29 | Discharge: 2014-10-29 | Disposition: A | Discharge status: Home / Self Care | Payer: No Typology Code available for payment source | Source: Ambulatory Visit | Attending: Radiation Oncology | Admitting: Radiation Oncology

## 2014-10-29 DIAGNOSIS — C3490 Malignant neoplasm of unspecified part of unspecified bronchus or lung: Secondary | ICD-10-CM | POA: Insufficient documentation

## 2014-10-29 DIAGNOSIS — C7931 Secondary malignant neoplasm of brain: Secondary | ICD-10-CM | POA: Insufficient documentation

## 2014-10-29 MED ORDER — GADOBENATE DIMEGLUMINE 529 MG/ML IV SOLN
10.0000 mL | Freq: Once | INTRAVENOUS | Status: AC | PRN
  Administered 2014-10-29: 9 mL via INTRAVENOUS

## 2014-11-01 ENCOUNTER — Ambulatory Visit: Payer: No Typology Code available for payment source | Admitting: Physical Therapy

## 2014-11-01 ENCOUNTER — Encounter: Payer: Self-pay | Admitting: Physical Therapy

## 2014-11-01 DIAGNOSIS — R29898 Other symptoms and signs involving the musculoskeletal system: Secondary | ICD-10-CM | POA: Diagnosis not present

## 2014-11-01 DIAGNOSIS — R269 Unspecified abnormalities of gait and mobility: Secondary | ICD-10-CM

## 2014-11-01 NOTE — Therapy (Signed)
[place], [place], [address] Phone: [phone]   Fax:  [phone]  Physical Therapy Treatment  Patient Details  Name: Deborah Foster MRN: [id number] Date of Birth: March 24, 1970 No Data Recorded  Encounter Date: 11/01/2014      PT End of Session - 11/01/14 1544    Visit Number 12   Number of Visits 17   Date for PT Re-Evaluation 11/22/14   PT Start Time 6712   PT Stop Time 1600   PT Time Calculation (min) 45 min   Activity Tolerance Patient tolerated treatment well   Behavior During Therapy [facility] for tasks assessed/performed      Past Medical History  Diagnosis Date  . Cancer ([place])   . Pneumonia   . Hemoptysis   . Adrenal cancer ([place])   . Lung mass   . Hypokalemia   . Radiation 08/23/14-09/07/14    Brain/chest and left hip 30 Gy 12 Fx    Past Surgical History  Procedure Laterality Date  . Video bronchoscopy Bilateral 08/09/2014    Procedure: VIDEO BRONCHOSCOPY WITHOUT FLUORO;  Surgeon: [name], MD;  Location: [place];  Service: Cardiopulmonary;  Laterality: Bilateral;    There were no vitals filed for this visit.  Visit Diagnosis:  Left leg weakness  Gait abnormality      Subjective Assessment - 11/01/14 1525    Subjective Some left posterior pelvic pain today (back pain).  I still have a cough. I felt fine after last visit.   Patient is accompained by: Interpreter   Pertinent History lung cancer diagnoes in July 2016 with metastsis to bone, brain, lung and left adrenal gland. PET scan 09/09/2014 revealed a large lesion in left acetabulum extending to left inferior pubic ramus. She also has lesions in lumbar spine and right humerus and manubrium    Currently in Pain? Yes   Pain Score 2    Pain Location Hip  Left ilium   Pain Orientation Left   Pain Descriptors / Indicators Aching   Pain Type Intractable pain   Pain Onset In the past 7 days   Pain Frequency Intermittent                          [facility] Adult PT Treatment/Exercise - 11/01/14 0001    Knee/Hip Exercises: Stretches   Active Hamstring Stretch Both;3 reps;20 seconds  Seated in chair   Knee/Hip Exercises: Aerobic   Nustep Level 4 x8 minutes while discussing fatigue and a walking program   Knee/Hip Exercises: Standing   Heel Raises Both;10 reps;2 sets  On fitter   Hip Flexion AROM;[place];Both  Marching with knees extended and then flexed   Hip Abduction Stengthening;Both;2 sets;10 reps   Other Standing Knee Exercises In // bars with PT verbal cues for safety and technique: tandem stance x1 minutes each; tandem walking forward and backward; side stepping with mini squat with each step   Other Standing Knee Exercises In //bars on fitter: single leg stance doing hamstring curls x10 BLE, SLS moving other hip into flexion, abduction and extension x5 each leg; eyes closed feet together witih external perturbations; SLS on fitter x1 minute   Knee/Hip Exercises: Seated   Long Arc Quad Both;10 reps  with 3 second holds for each one   Marching Both;20 reps                        Long  Term Clinic Goals - 10/20/14 1232    CC Long Term Goal  #1   Title Patient will be independent in a home exercise program  pt has basic home exercise and is doing balance in daily activities    Time 4   Period Weeks   Status Partially Met   CC Long Term Goal  #2   Title pt will verbalize understanding of core stabalizations and ways to incorporate into daily activities for safety   continues to needs cues for core stabalization    Time 4   Period Weeks   Status Partially Met   CC Long Term Goal  #3   Title Pt will be able to walk for 5 minutes without stopping with assistive device as needed so that she will be able to walk in the community without another person nearby   Status Achieved   CC Long Term Goal  #4   Title pt will be independent in sit to supine and return with  minimal rest stops so she can be on her own at home.   Status Achieved   CC Long Term Goal  #5   Title pt will be able to complete 14  repetitions of sit to stand in 30 seconds demonstrating functional strength improvement   11   Status Revised   CC Long Term Goal  #6   Title pt will have 4/5 muscle strength in left hip abduction and extension so that she will perform daily activites with greater ease    Time 4   Period Weeks   Status New   Additional Goals   Additional Goals Yes            Plan - 11/01/14 1544    Clinical Impression Statement Avoided step up and functional squats today due to left posterior hip pain.  She required more rests between exercises today due to fatigue.   Pt will benefit from skilled therapeutic intervention in order to improve on the following deficits Abnormal gait;Difficulty walking;Decreased safety awareness;Decreased endurance;Decreased activity tolerance;Decreased knowledge of precautions;Pain;Decreased knowledge of use of DME;Decreased strength   Rehab Potential Good   Clinical Impairments Affecting Rehab Potential lung cancer with lesions to brain, lung, bone and adrenal gland with large lesion at left pelvis and other bony areas.  Avoid added resistance to exercise   PT Frequency 2x / week   PT Duration 4 weeks   PT Treatment/Interventions Therapeutic exercise;Balance training;Gait training   PT Next Visit Plan Continue strengthening and balance exercises for improved function   Consulted and Agree with Plan of Care Patient        Problem List Patient Active Problem List   Diagnosis Date Noted  . Mucositis 10/22/2014  . Rash 10/08/2014  . Non-small cell carcinoma of lung, stage 4 ([place]) 08/26/2014  . Bone disease, metabolic 75/88/3254  . Brain metastases ([place])   . Palliative care encounter   . Adrenal mass, left ([place])   . CAP (community acquired pneumonia) 08/07/2014  . Pneumonia 08/07/2014  . Lung mass 08/07/2014  . Hemoptysis  08/07/2014   Annia Friendly, PT 11/01/2014 4:02 PM  [place], [place], [address] Phone: [phone]   Fax:  [phone]  Name: Deborah Foster MRN: [id number] Date of Birth: [birth year]

## 2014-11-02 ENCOUNTER — Ambulatory Visit (HOSPITAL_BASED_OUTPATIENT_CLINIC_OR_DEPARTMENT_OTHER): Payer: No Typology Code available for payment source | Admitting: Internal Medicine

## 2014-11-02 ENCOUNTER — Other Ambulatory Visit: Payer: Self-pay | Admitting: Medical Oncology

## 2014-11-02 ENCOUNTER — Other Ambulatory Visit (HOSPITAL_BASED_OUTPATIENT_CLINIC_OR_DEPARTMENT_OTHER): Payer: No Typology Code available for payment source

## 2014-11-02 ENCOUNTER — Telehealth: Payer: Self-pay | Admitting: Internal Medicine

## 2014-11-02 ENCOUNTER — Encounter: Payer: Self-pay | Admitting: Internal Medicine

## 2014-11-02 VITALS — BP 99/60 | HR 87 | Temp 98.4°F | Resp 18 | Ht 60.0 in | Wt 102.5 lb

## 2014-11-02 DIAGNOSIS — E889 Metabolic disorder, unspecified: Secondary | ICD-10-CM

## 2014-11-02 DIAGNOSIS — M9080 Osteopathy in diseases classified elsewhere, unspecified site: Secondary | ICD-10-CM

## 2014-11-02 DIAGNOSIS — C3490 Malignant neoplasm of unspecified part of unspecified bronchus or lung: Secondary | ICD-10-CM

## 2014-11-02 DIAGNOSIS — C7931 Secondary malignant neoplasm of brain: Secondary | ICD-10-CM | POA: Diagnosis not present

## 2014-11-02 DIAGNOSIS — C7951 Secondary malignant neoplasm of bone: Secondary | ICD-10-CM

## 2014-11-02 DIAGNOSIS — M898X9 Other specified disorders of bone, unspecified site: Secondary | ICD-10-CM

## 2014-11-02 DIAGNOSIS — C342 Malignant neoplasm of middle lobe, bronchus or lung: Secondary | ICD-10-CM

## 2014-11-02 DIAGNOSIS — Z5111 Encounter for antineoplastic chemotherapy: Secondary | ICD-10-CM | POA: Insufficient documentation

## 2014-11-02 DIAGNOSIS — R21 Rash and other nonspecific skin eruption: Secondary | ICD-10-CM

## 2014-11-02 DIAGNOSIS — C3491 Malignant neoplasm of unspecified part of right bronchus or lung: Secondary | ICD-10-CM

## 2014-11-02 LAB — CBC WITH DIFFERENTIAL/PLATELET
BASO%: 0.3 % (ref 0.0–2.0)
Basophils Absolute: 0 10*3/uL (ref 0.0–0.1)
EOS%: 0.9 % (ref 0.0–7.0)
Eosinophils Absolute: 0.1 10*3/uL (ref 0.0–0.5)
HCT: 38.9 % (ref 34.8–46.6)
HGB: 13 g/dL (ref 11.6–15.9)
LYMPH#: 1 10*3/uL (ref 0.9–3.3)
LYMPH%: 15.7 % (ref 14.0–49.7)
MCH: 30.5 pg (ref 25.1–34.0)
MCHC: 33.4 g/dL (ref 31.5–36.0)
MCV: 91.3 fL (ref 79.5–101.0)
MONO#: 0.5 10*3/uL (ref 0.1–0.9)
MONO%: 7.7 % (ref 0.0–14.0)
NEUT#: 5 10*3/uL (ref 1.5–6.5)
NEUT%: 75.4 % (ref 38.4–76.8)
Platelets: 366 10*3/uL (ref 145–400)
RBC: 4.26 10*6/uL (ref 3.70–5.45)
RDW: 13.9 % (ref 11.2–14.5)
WBC: 6.6 10*3/uL (ref 3.9–10.3)

## 2014-11-02 LAB — COMPREHENSIVE METABOLIC PANEL (CC13)
AST: 12 U/L (ref 5–34)
Albumin: 3.1 g/dL — ABNORMAL LOW (ref 3.5–5.0)
Alkaline Phosphatase: 105 U/L (ref 40–150)
Anion Gap: 7 mEq/L (ref 3–11)
CHLORIDE: 109 meq/L (ref 98–109)
CO2: 27 meq/L (ref 22–29)
CREATININE: 0.6 mg/dL (ref 0.6–1.1)
Calcium: 8.7 mg/dL (ref 8.4–10.4)
EGFR: >90 mL/min/{1.73_m2} (ref >90)
GLUCOSE: 107 mg/dL (ref 70–140)
Potassium: 3.3 mEq/L — ABNORMAL LOW (ref 3.5–5.1)
SODIUM: 143 meq/L (ref 136–145)
Total Bilirubin: 0.77 mg/dL (ref 0.20–1.20)
Total Protein: 6.1 g/dL — ABNORMAL LOW (ref 6.4–8.3)

## 2014-11-02 MED ORDER — METHYLPREDNISOLONE 4 MG PO TBPK: ORAL_TABLET | ORAL | Status: DC

## 2014-11-02 NOTE — Progress Notes (Signed)
[facility] [phone]   [phone]  OFFICE PROGRESS NOTE  [name], MD [place] [address]  DIAGNOSIS: Stage IV (T2a, N1, M1b) non-small cell lung cancer, adenocarcinoma presented with right middle lobe lung mass in addition to right hilar adenopathy as well as metastatic disease to the bone, brain as well as left adrenal metastasis diagnosed in July 2016  PRIOR THERAPY: The patient is currently undergoing palliative radiotherapy to the brain as well as metastatic bone lesions in the pelvis.  CURRENT THERAPY: Tarceva 150 mg by mouth daily started on 09/25/2014.  INTERVAL HISTORY: Deborah Foster 44 y.o. female returns to the clinic today for hospital follow-up visit. The patient is tolerating her current treatment with Tarceva fairly well except for grade 1 skin rash mainly on the face. She was recently treated with doxycycline orally and addition to clindamycin lotion. She denied having any significant diarrhea. She continues to have mild cough and she is currently on Mucinex and Phenergan with codeine. She lost few pounds recently.  She denied having any significant chest pain, shortness breath, or hemoptysis. The patient denied having any headache or visual changes. She has no significant nausea or vomiting. She has no fever or chills. She has repeat bloodwork earlier today and she is here for evaluation and management any adverse effect of her treatment.   MEDICAL HISTORY: Past Medical History  Diagnosis Date  . Cancer ([place])   . Pneumonia   . Hemoptysis   . Adrenal cancer ([place])   . Lung mass   . Hypokalemia   . Radiation 08/23/14-09/07/14    Brain/chest and left hip 30 Gy 12 Fx    ALLERGIES:  has No Known Allergies.  MEDICATIONS:  Current Outpatient Prescriptions  Medication Sig Dispense Refill  . albuterol (PROVENTIL HFA;VENTOLIN HFA) 108 (90 BASE) MCG/ACT inhaler Inhale 2 puffs into the lungs every 6  (six) hours as needed for wheezing or shortness of breath. 1 Inhaler 2  . clindamycin (CLINDAGEL) 1 % gel Apply topically 2 (two) times daily. 30 g 1  . doxycycline (VIBRA-TABS) 100 MG tablet Take 1 tablet (100 mg total) by mouth daily. 10 tablet 0  . erlotinib (TARCEVA) 150 MG tablet Take 1 tablet (150 mg total) by mouth daily. Take on an empty stomach 1 hour before meals or 2 hours after. 30 tablet 2  . guaiFENesin (MUCINEX) 600 MG 12 hr tablet Take 600 mg by mouth 2 (two) times daily.    . magic mouthwash w/lidocaine SOLN Take 5 mLs by mouth 4 (four) times daily as needed.  1  . Misc Natural Products (TURMERIC CURCUMIN) CAPS Take 750 capsules by mouth.    . ondansetron (ZOFRAN) 4 MG tablet Take 1 tablet (4 mg total) by mouth every 8 (eight) hours as needed for nausea or vomiting. 30 tablet 0  . potassium chloride SA (K-DUR,KLOR-CON) 20 MEQ tablet Take 2 tablets (40 mEq total) by mouth daily. 20 tablet 0  . promethazine-codeine (PHENERGAN WITH CODEINE) 6.25-10 MG/5ML syrup Take 5 mLs by mouth every 4 (four) hours as needed.  0  . UNABLE TO FIND Take 650 mg by mouth. Med Name:corduceps sinesis     No current facility-administered medications for this visit.    SURGICAL HISTORY:  Past Surgical History  Procedure Laterality Date  . Video bronchoscopy Bilateral 08/09/2014    Procedure: VIDEO BRONCHOSCOPY WITHOUT FLUORO;  Surgeon: [name], MD;  Location: [facility];  Service:  Cardiopulmonary;  Laterality: Bilateral;    REVIEW OF SYSTEMS:  Constitutional: negative Eyes: negative Ears, nose, mouth, throat, and face: negative Respiratory: positive for cough Cardiovascular: negative Gastrointestinal: negative Genitourinary:negative Integument/breast: positive for dryness and rash Hematologic/lymphatic: negative Musculoskeletal:positive for bone pain Neurological: negative Behavioral/Psych: negative Endocrine: negative Allergic/Immunologic: negative   PHYSICAL EXAMINATION:  General appearance: alert, cooperative, fatigued and no distress Head: Normocephalic, without obvious abnormality, atraumatic Neck: no adenopathy, no JVD, supple, symmetrical, trachea midline and thyroid not enlarged, symmetric, no tenderness/mass/nodules Lymph nodes: Cervical, supraclavicular, and axillary nodes normal. Resp: clear to auscultation bilaterally Back: symmetric, no curvature. ROM normal. No CVA tenderness. Cardio: regular rate and rhythm, S1, S2 normal, no murmur, click, rub or gallop GI: soft, non-tender; bowel sounds normal; no masses,  no organomegaly Extremities: extremities normal, atraumatic, no cyanosis or edema Neurologic: Alert and oriented X 3, normal strength and tone. Normal symmetric reflexes. Normal coordination and gait  ECOG PERFORMANCE STATUS: 1 - Symptomatic but completely ambulatory  Blood pressure 99/60, pulse 87, temperature 98.4 F (36.9 C), temperature source Oral, resp. rate 18, height 5' (1.524 m), weight 102 lb 8 oz (46.494 kg), SpO2 100 %.  LABORATORY DATA: Lab Results  Component Value Date   WBC 6.6 11/02/2014   HGB 13.0 11/02/2014   HCT 38.9 11/02/2014   MCV 91.3 11/02/2014   PLT 366 11/02/2014      Chemistry      Component Value Date/Time   NA 140 10/22/2014 1018   NA 135 08/17/2014 0553   K 3.9 10/22/2014 1018   K 3.2* 08/17/2014 0553   CL 96* 08/17/2014 0553   CO2 25 10/22/2014 1018   CO2 26 08/17/2014 0553   BUN 4.4* 10/22/2014 1018   BUN <5* 08/17/2014 0553   CREATININE 0.6 10/22/2014 1018   CREATININE 0.41* 08/17/2014 0553      Component Value Date/Time   CALCIUM 8.7 10/22/2014 1018   CALCIUM 8.4* 08/17/2014 0553   ALKPHOS 121 10/22/2014 1018   ALKPHOS 145* 08/08/2014 0458   AST 15 10/22/2014 1018   AST 16 08/08/2014 0458   ALT <9 10/22/2014 1018   ALT 14 08/08/2014 0458   BILITOT 1.12 10/22/2014 1018   BILITOT 0.5 08/08/2014 0458       RADIOGRAPHIC STUDIES: Mr Jeri Cos Wo Contrast  11-29-2014  CLINICAL DATA:   Lung cancer.  Follow-up brain metastasis. EXAM: MRI HEAD WITHOUT AND WITH CONTRAST TECHNIQUE: Multiplanar, multiecho pulse sequences of the brain and surrounding structures were obtained without and with intravenous contrast. CONTRAST:  42m MULTIHANCE GADOBENATE DIMEGLUMINE 529 MG/ML IV SOLN COMPARISON:  MRI head 08/16/2014 FINDINGS: Interval improvement in multiple metastatic deposits in the brain. Most of these show evidence of prior hemorrhage as noted previously. The largest lesion previously was in the left posterior temporal lobe. This currently measures 20 x 11 mm and previously measured 35 x 24 mm. Hemosiderin is present. Mild surrounding edema has improved. Left frontal lesion also has improved currently measuring 14 x 10 mm and previously measuring 19 x 22 mm. Hemosiderin present. Mild white matter edema has improved in the interval. Numerous other small lesions also have improved. Small lesions in the cerebellum bilaterally have improved. No new lesions. Ventricle size normal. No significant midline shift. Previously there was midline shift to the right due to edema. Enhancing lesion in the left parietal bone is slightly larger and suspicious for metastatic disease to bone. This measures approximately 8 mm. Negative for acute infarct. IMPRESSION: Multiple hemorrhagic brain metastasis. These lesions  are smaller compared with the prior MRI. There is decreased edema and mass-effect in the left hemisphere. Left parietal bone enhancing lesion is concerning for metastatic disease and appears slightly larger compared with the prior study Electronically Signed   By: Franchot Gallo M.D.   On: 10/30/2014 08:15    ASSESSMENT AND PLAN: This is a very pleasant 45 years old Asian female never smoker with recently diagnosed with stage IV non-small cell lung cancer, adenocarcinoma presented with right middle lobe lung mass in addition to right hilar adenopathy and metastatic disease to the bones, brain and left  adrenal gland.  She status post whole brain irradiation was some improvement in her disease on the recent MRI of the brain. The patient is currently on treatment with Tarceva 150 mg by mouth daily and tolerating it fairly well except for grade 1 skin rash. I recommended for her to continue her current treatment with Tarceva and to apply clindamycin lotion to the skin rash area. For the lack of appetite and skin rash, I will start the patient on Medrol Dosepak. She will come back for follow-up visit in one month's for reevaluation with repeat CT scan of the chest, abdomen and pelvis for restaging of her disease. She was advised to call immediately if she has any concerning symptoms in the interval. The patient voices understanding of current disease status and treatment options and is in agreement with the current care plan.  All questions were answered. The patient knows to call the clinic with any problems, questions or concerns. We can certainly see the patient much sooner if necessary.  Disclaimer: This note was dictated with voice recognition software. Similar sounding words can inadvertently be transcribed and may not be corrected upon review.

## 2014-11-02 NOTE — Telephone Encounter (Signed)
Gave and pritned appt sched and avs for pt for NOV °

## 2014-11-04 ENCOUNTER — Telehealth: Payer: Self-pay | Admitting: *Deleted

## 2014-11-04 ENCOUNTER — Ambulatory Visit: Payer: No Typology Code available for payment source

## 2014-11-04 ENCOUNTER — Other Ambulatory Visit: Payer: Self-pay | Admitting: Radiation Oncology

## 2014-11-04 DIAGNOSIS — R269 Unspecified abnormalities of gait and mobility: Secondary | ICD-10-CM

## 2014-11-04 DIAGNOSIS — C7931 Secondary malignant neoplasm of brain: Secondary | ICD-10-CM

## 2014-11-04 DIAGNOSIS — R29898 Other symptoms and signs involving the musculoskeletal system: Secondary | ICD-10-CM

## 2014-11-04 NOTE — Telephone Encounter (Addendum)
Ms. Elner Seifert called with report that her MRI scan was negative and that she will have an appointment scheduled by Dr. Pablo Ledger for a FU appt. And future MRI.  She expressed thanks.

## 2014-11-04 NOTE — Therapy (Signed)
[place], [place], [address] Phone: [phone]   Fax:  [phone]  Physical Therapy Treatment  Patient Details  Name: Deborah Foster MRN: [id number] Date of Birth: [birth year] No Data Recorded  Encounter Date: 11/04/2014      PT End of Session - 11/04/14 1052    Visit Number 13   Number of Visits 17   Date for PT Re-Evaluation 11/22/14   PT Start Time 1024   PT Stop Time 1104   PT Time Calculation (min) 40 min   Activity Tolerance Patient tolerated treatment well   Behavior During Therapy [facility] for tasks assessed/performed      Past Medical History  Diagnosis Date  . Cancer ([place])   . Pneumonia   . Hemoptysis   . Adrenal cancer ([facility])   . Lung mass   . Hypokalemia   . Radiation 08/23/14-09/07/14    Brain/chest and left hip 30 Gy 12 Fx    Past Surgical History  Procedure Laterality Date  . Video bronchoscopy Bilateral 08/09/2014    Procedure: VIDEO BRONCHOSCOPY WITHOUT FLUORO;  Surgeon: [name], MD;  Location: [facility];  Service: Cardiopulmonary;  Laterality: Bilateral;    There were no vitals filed for this visit.  Visit Diagnosis:  Left leg weakness  Gait abnormality      Subjective Assessment - 11/04/14 1033    Subjective Still coughing, didn't sleep good last night so I feel tired today. But not having any pain.    Currently in Pain? No/denies                         [facility] Adult PT Treatment/Exercise - 11/04/14 0001    Knee/Hip Exercises: Aerobic   Nustep Level 5 5:30 minutes, started having increased fatigue so stopped. Pt reports this from not sleeping well last night.     Knee/Hip Exercises: Standing   Heel Raises Both;2 sets;10 reps  On fitter in // bars   Hip Flexion Stengthening;Both;10 reps;Knee bent;Knee straight  On fitter in // bars   Hip Abduction Stengthening;Both;2 sets;10 reps  On fitter in // bars   Hip Extension Stengthening;Both;2  sets;10 reps  On fitter in // bars   Forward Step Up Both;2 sets;10 reps;Hand Hold: 0  On fitter in // bars with hip ext/flex contralateral leg   Functional Squat 2 sets;10 reps  On fitter in // bars   Other Standing Knee Exercises In //bars: Heel-toe walking front and back 3 reps each, and then sidestepping in mini squat 2 reps   Other Standing Knee Exercises In //bars on fitter: Bil tandem stance, then Bil SLS x1 minute each, SLS with swinging opposite [place] front-back and side-side x30 seconds each.                        [facility] Goals - 11/04/14 1059    CC Long Term Goal  #1   Title Patient will be independent in a home exercise program  Doing exercises 1x/day for about 10 minutes, she is working up to 2x/day.   Status Achieved   CC Long Term Goal  #2   Title pt will verbalize understanding of core stabalizations and ways to incorporate into daily activities for safety   Requires less cuing for core engagement now   Status Partially Met            Plan - 11/04/14 1052  Clinical Impression Statement PTA providing verbal cues throughout for safety and proper technique. Pt had no pain today so able to perform step ups and squats. She did still require multiple rest breaks reporting fatigue from not sleeping well last night and legs feel more tired today.    Pt will benefit from skilled therapeutic intervention in order to improve on the following deficits Abnormal gait;Difficulty walking;Decreased safety awareness;Decreased endurance;Decreased activity tolerance;Decreased knowledge of precautions;Pain;Decreased knowledge of use of DME;Decreased strength   Rehab Potential Good   Clinical Impairments Affecting Rehab Potential lung cancer with lesions to brain, lung, bone and adrenal gland with large lesion at left pelvis and other bony areas.  Avoid added resistance to exercise   PT Frequency 2x / week   PT Duration 4 weeks   PT Next Visit Plan Continue  strengthening and balance exercises for improved function. Assess hip strength.   Consulted and Agree with Plan of Care Patient        Problem List Patient Active Problem List   Diagnosis Date Noted  . Encounter for antineoplastic chemotherapy 11/02/2014  . Mucositis 10/22/2014  . Rash 10/08/2014  . Non-small cell carcinoma of lung, stage 4 ([place]) 08/26/2014  . Bone disease, metabolic 59/01/7239  . Brain metastases ([place])   . Palliative care encounter   . Adrenal mass, left ([place])   . CAP (community acquired pneumonia) 08/07/2014  . Pneumonia 08/07/2014  . Lung mass 08/07/2014  . Hemoptysis 08/07/2014    Otelia Limes, PTA 11/04/2014, 11:10 AM  [place], [place], [address] Phone: [phone]   Fax:  [phone]  Name: Deborah Foster MRN: [id number] Date of Birth: 1970-03-16

## 2014-11-05 ENCOUNTER — Telehealth: Payer: Self-pay | Admitting: *Deleted

## 2014-11-05 NOTE — Telephone Encounter (Signed)
CALLED PATIENT TO INFORM OF MRI FOR 01-31-15 AND FU VISIT WITH DR. Pablo Ledger ON 02-04-15, SPOKE WITH PATIENT AND SHE IS AWARE OF THESE APPTS.

## 2014-11-08 ENCOUNTER — Ambulatory Visit: Payer: No Typology Code available for payment source

## 2014-11-08 DIAGNOSIS — R29898 Other symptoms and signs involving the musculoskeletal system: Secondary | ICD-10-CM | POA: Diagnosis not present

## 2014-11-08 DIAGNOSIS — R269 Unspecified abnormalities of gait and mobility: Secondary | ICD-10-CM

## 2014-11-08 NOTE — Therapy (Signed)
[place], [place], [address] Phone: [phone]   Fax:  [phone]  Physical Therapy Treatment  Patient Details  Name: Deborah Foster MRN: [id number] Date of Birth: April 09, 1970 No Data Recorded  Encounter Date: 11/08/2014      PT End of Session - 11/08/14 1101    Visit Number 14   Number of Visits 17   Date for PT Re-Evaluation 11/22/14   PT Start Time 1020   PT Stop Time 1104   PT Time Calculation (min) 44 min   Activity Tolerance Patient tolerated treatment well   Behavior During Therapy [facility] for tasks assessed/performed      Past Medical History  Diagnosis Date  . Cancer ([place])   . Pneumonia   . Hemoptysis   . Adrenal cancer ([place])   . Lung mass   . Hypokalemia   . Radiation 08/23/14-09/07/14    Brain/chest and left hip 30 Gy 12 Fx    Past Surgical History  Procedure Laterality Date  . Video bronchoscopy Bilateral 08/09/2014    Procedure: VIDEO BRONCHOSCOPY WITHOUT FLUORO;  Surgeon: [name], MD;  Location: [place];  Service: Cardiopulmonary;  Laterality: Bilateral;    There were no vitals filed for this visit.  Visit Diagnosis:  Left leg weakness  Gait abnormality      Subjective Assessment - 11/08/14 1026    Subjective Doing okay today, just having little bit of pain at the back of my hip.   Currently in Pain? Yes   Pain Score 1    Pain Location Hip   Pain Orientation Left;Posterior   Pain Descriptors / Indicators Sharp   Aggravating Factors  bone metastisis   Pain Relieving Factors exercise, staying active                         [facility] Adult PT Treatment/Exercise - 11/08/14 0001    Knee/Hip Exercises: Aerobic   Nustep Level 5 x 8 minutes   Knee/Hip Exercises: Standing   Hip Flexion Stengthening;Both;10 reps;Knee bent  On fitter in // bars   Hip Abduction Stengthening;Both;2 sets;10 reps  On fitter in // bars   Hip Extension  Stengthening;Both;2 sets;10 reps  On fitter in // bars   Lateral Step Up Both;2 sets;10 reps;Hand Hold: 0;Step Height: 2"  With blue oval on step and contralateral hip abd   Forward Step Up Both;2 sets;10 reps;Hand Hold: 0;Step Height: 2"  Blue oval on step; contrlateral hip flex/ext   Functional Squat 2 sets;10 reps  On fitter in // bars   Other Standing Knee Exercises In //bars: Standing on blue fitter with 4 legs for: Bil tandem stance with eyes open 30 sec, closed 30 sec, then eyes open 15 sec, closed 45 sec; eyes closed challenging for pt, SBA-CGA with this.   Other Standing Knee Exercises In // bars: Heel-toe gait front and retro 2 sets each, bil sidestepping in mini squat position 1 set each direction                        [place] - 11/04/14 1059    CC Long Term Goal  #1   Title Patient will be independent in a home exercise program  Doing exercises 1x/day for about 10 minutes, she is working up to 2x/day.   Status Achieved   CC Long Term Goal  #2   Title pt will verbalize understanding  of core stabalizations and ways to incorporate into daily activities for safety   Requires less cuing for core engagement now   Status Partially Met            Plan - 11/08/14 1059    Clinical Impression Statement Mrs. PIGMAN continues to do very well with therapy, tolerated increasesd difficulty with some of balance activities without increased pain. Also able to do resistance 5 on Nustep today for 8 minutes after treatment with therapist present for all activities for safety and to asess throughout. She requires less seated rest breaks at this time, though does still require them, and she reports noticing feeling more secure when she walks now, not as off balance during day.   Pt will benefit from skilled therapeutic intervention in order to improve on the following deficits Abnormal gait;Difficulty walking;Decreased safety awareness;Decreased endurance;Decreased  activity tolerance;Decreased knowledge of precautions;Pain;Decreased knowledge of use of DME;Decreased strength   Rehab Potential Good   Clinical Impairments Affecting Rehab Potential lung cancer with lesions to brain, lung, bone and adrenal gland with large lesion at left pelvis and other bony areas.  Avoid added resistance to exercise   PT Frequency 2x / week   PT Duration 4 weeks   PT Treatment/Interventions Therapeutic exercise;Balance training;Gait training   PT Next Visit Plan Next visit assess sit-stand in 30 seconds and test hip strength for goal assess; cont balance activities and Cham strengthening without resistance.   Consulted and Agree with Plan of Care Patient        Problem List Patient Active Problem List   Diagnosis Date Noted  . Encounter for antineoplastic chemotherapy 11/02/2014  . Mucositis 10/22/2014  . Rash 10/08/2014  . Non-small cell carcinoma of lung, stage 4 ([place]) 08/26/2014  . Bone disease, metabolic 22/44/9753  . Brain metastases ([place])   . Palliative care encounter   . Adrenal mass, left ([place])   . CAP (community acquired pneumonia) 08/07/2014  . Pneumonia 08/07/2014  . Lung mass 08/07/2014  . Hemoptysis 08/07/2014    Otelia Limes, PTA 11/08/2014, 11:15 AM  [place], [place], [address] Phone: [phone]   Fax:  [phone]  Name: Deborah Foster MRN: [id number] Date of Birth: 08-02-1970

## 2014-11-10 ENCOUNTER — Ambulatory Visit: Payer: No Typology Code available for payment source

## 2014-11-10 DIAGNOSIS — R29898 Other symptoms and signs involving the musculoskeletal system: Secondary | ICD-10-CM

## 2014-11-10 DIAGNOSIS — R269 Unspecified abnormalities of gait and mobility: Secondary | ICD-10-CM

## 2014-11-10 NOTE — Therapy (Signed)
[place], [place], [address] Phone: [phone]   Fax:  [phone]  Physical Therapy Treatment  Patient Details  Name: Deborah Foster MRN: [id number] Date of Birth: 24-Mar-1970 No Data Recorded  Encounter Date: 11/10/2014      PT End of Session - 11/10/14 1220    Visit Number 15   Number of Visits 17   Date for PT Re-Evaluation 11/22/14   PT Start Time 1020   PT Stop Time 1105   PT Time Calculation (min) 45 min   Activity Tolerance Patient tolerated treatment well   Behavior During Therapy [facility] for tasks assessed/performed      Past Medical History  Diagnosis Date  . Cancer ([place])   . Pneumonia   . Hemoptysis   . Adrenal cancer ([place])   . Lung mass   . Hypokalemia   . Radiation 08/23/14-09/07/14    Brain/chest and left hip 30 Gy 12 Fx    Past Surgical History  Procedure Laterality Date  . Video bronchoscopy Bilateral 08/09/2014    Procedure: VIDEO BRONCHOSCOPY WITHOUT FLUORO;  Surgeon: [name], MD;  Location: [facility];  Service: Cardiopulmonary;  Laterality: Bilateral;    There were no vitals filed for this visit.  Visit Diagnosis:  Left leg weakness  Gait abnormality      Subjective Assessment - 11/10/14 1033    Subjective Doing good, think I'll be ready for discharge next week.   Currently in Pain? No/denies            [facility] PT Assessment - 11/10/14 0001    Sit to Stand   Comments 13                     [facility] Adult PT Treatment/Exercise - 11/10/14 0001    Knee/Hip Exercises: Aerobic   Nustep Level 5 x 8 minutes; PTA present to discuss progress with pt.    Knee/Hip Exercises: Standing   Lateral Step Up Both;2 sets;10 reps;Hand Hold: 0;Step Height: 2"  Blue oval on step; contralateral hip abd   Lateral Step Up Limitations Cuing to slow down/control leg swings, seated rest after   Forward Step Up Both;2 sets;10 reps;Hand Hold: 0;Step Height: 2"   Blue oval on step; contralateral hip ext/flex   Forward Step Up Limitations Cuing to slow down and control leg swings, seated rest after   Functional Squat 2 sets;Other (comment);Limitations  In // bars on blue fitter with 4 legs   Other Standing Knee Exercises In // bars: Heel-toe gait front and retro 2 sets each; Bil sidestepping in mini squat 2 sets each direction with 1 seated rest between sets; standing on blue fitter with 4 legs for bil tandem stance with eyes closed 2 sets of 1 minute each; standing on 2" step for hip hiking 2 sets of 10 reps standing on Rt [place] for Lt hip with tactile cuing for end ROM, better than a few weeks ago but pt still limited at end ROM.                PT Education - 11/10/14 1105    Education provided Yes   Education Details Issued more hip strength and balance   Person(s) Educated Patient;Other (comment)  Interpreter present   Methods Explanation;Demonstration;Handout   Comprehension Verbalized understanding;Returned demonstration                [facility] Goals - 11/10/14 1225    CC  Long Term Goal  #1   Title Patient will be independent in a home exercise program   Status Achieved   CC Long Term Goal  #2   Title pt will verbalize understanding of core stabalizations and ways to incorporate into daily activities for safety    Status Achieved   CC Long Term Goal  #3   Title Pt will be able to walk for 5 minutes without stopping with assistive device as needed so that she will be able to walk in the community without another person nearby   Status Achieved   CC Long Term Goal  #4   Title pt will be independent in sit to supine and return with minimal rest stops so she can be on her own at home.   Status Achieved   CC Long Term Goal  #5   Title pt will be able to complete 14  repetitions of sit to stand in 30 seconds demonstrating functional strength improvement   Able to perform 13 on 11/10/14   Status On-going   CC Long Term  Goal  #6   Status Deferred  Not going to apply pressure to Lt Buckels due to bony mets per Serafina Royals, PT            Plan - 11/10/14 1221    Clinical Impression Statement Progressed Mrs. Seminara HEP today to include some of the balance and hip strengthening exercises that we have been doing in the clinic. She has made excellent progress and is very compliant with her HEP. Her Trendelenberg gait is also almost nonexistent, becomes very mild as she becomes fatigued in her Pedley's. Mrs. Arauz is progessing well with her goals performing 13 sit-stands in 30 seconds today (goal is 14) and will seemingly be ready for discharge next week.    Pt will benefit from skilled therapeutic intervention in order to improve on the following deficits Abnormal gait;Difficulty walking;Decreased safety awareness;Decreased endurance;Decreased activity tolerance;Decreased knowledge of precautions;Pain;Decreased knowledge of use of DME;Decreased strength   Rehab Potential Good   Clinical Impairments Affecting Rehab Potential lung cancer with lesions to brain, lung, bone and adrenal gland with large lesion at left pelvis and other bony areas.  Avoid added resistance to exercise   PT Frequency 2x / week   PT Duration 4 weeks   PT Treatment/Interventions Therapeutic exercise;Balance training;Gait training   PT Next Visit Plan Cont balance activities and [place] strengthening without resistance and finalizing HEP prn preparing pt for discharge next week as she feels ready for this.    Consulted and Agree with Plan of Care Patient        Problem List Patient Active Problem List   Diagnosis Date Noted  . Encounter for antineoplastic chemotherapy 11/02/2014  . Mucositis 10/22/2014  . Rash 10/08/2014  . Non-small cell carcinoma of lung, stage 4 ([place]) 08/26/2014  . Bone disease, metabolic 56/43/3295  . Brain metastases ([place])   . Palliative care encounter   . Adrenal mass, left ([place])   . CAP (community acquired pneumonia)  08/07/2014  . Pneumonia 08/07/2014  . Lung mass 08/07/2014  . Hemoptysis 08/07/2014    Otelia Limes, PTA 11/10/2014, 12:33 PM  [place], [place], [address] Phone: [phone]   Fax:  [phone]  Name: EUNIQUE BALIK MRN: [id number] Date of Birth: 1970-06-06

## 2014-11-10 NOTE — Patient Instructions (Signed)
Stand on step or phone book on Rt leg and drop Lt hip and then raise it back up without bending Rt knee!  Also at kitchen counter do sidestepping in mini squat both directions.   Practice heel-toe walking at counter  ABDUCTION: Standing (Active)    Hip Abduction (Standing)   Try no hands. Stand with support. Lift right leg out to side, keeping toe forward. Hold for _3__ seconds.   Repeat _10__ times, 2 sets each leg.. Do _1-2__ times a day. Repeat with other leg.  Also swing leg in back, no leaning forward!!   Also swing leg in front.  Copyright  VHI. All rights reserved.

## 2014-11-15 ENCOUNTER — Ambulatory Visit: Payer: No Typology Code available for payment source | Admitting: Physical Therapy

## 2014-11-15 DIAGNOSIS — R269 Unspecified abnormalities of gait and mobility: Secondary | ICD-10-CM

## 2014-11-15 DIAGNOSIS — R29898 Other symptoms and signs involving the musculoskeletal system: Secondary | ICD-10-CM | POA: Diagnosis not present

## 2014-11-15 NOTE — Therapy (Signed)
[place], [place], [address] Phone: [phone]   Fax:  [phone]  Physical Therapy Treatment  Patient Details  Name: Deborah Foster MRN: [id number] Date of Birth: 10-Jul-1970 No Data Recorded  Encounter Date: 11/15/2014      PT End of Session - 11/15/14 1223    Visit Number 16   Number of Visits 17   Date for PT Re-Evaluation 11/22/14   PT Start Time 0846   PT Stop Time 0930   PT Time Calculation (min) 44 min   Activity Tolerance Patient tolerated treatment well   Behavior During Therapy [facility] for tasks assessed/performed      Past Medical History  Diagnosis Date  . Cancer ([place])   . Pneumonia   . Hemoptysis   . Adrenal cancer ([place])   . Lung mass   . Hypokalemia   . Radiation 08/23/14-09/07/14    Brain/chest and left hip 30 Gy 12 Fx    Past Surgical History  Procedure Laterality Date  . Video bronchoscopy Bilateral 08/09/2014    Procedure: VIDEO BRONCHOSCOPY WITHOUT FLUORO;  Surgeon: [name], MD;  Location: [place];  Service: Cardiopulmonary;  Laterality: Bilateral;    There were no vitals filed for this visit.  Visit Diagnosis:  Left leg weakness  Gait abnormality      Subjective Assessment - 11/15/14 0850    Subjective Has been coughing; started last month, but cough medicine is not helping.   Currently in Pain? No/denies   Pain Location Other (Comment)  thigh   Pain Orientation Left   Aggravating Factors  comes and goes without identifiable trigger                         [facility] Adult PT Treatment/Exercise - 11/15/14 0001    Knee/Hip Exercises: Aerobic   Nustep Level 5 x 9 minutes; PT present to discuss progress with pt.    Knee/Hip Exercises: Standing   Other Standing Knee Exercises In // bars: lunge walking forward and back x 4 each way the length of bars with cuing to keep head and chest up; side step-ups on four-footed fitter, alternating sides  x 60 seconds; sidestepping with squats x 6 trips in parallel bars (then patient was tired); on four-footed fitter, step downs x 10 x 2 on each side, then side kicks x 10 x 3 each side.   Knee/Hip Exercises: Supine   Bridges with Diona Foley Squeeze AROM;10 reps;2 sets   Other Supine Knee/Hip Exercises Bridging with single leg kick x 10 reps for each side;    Other Supine Knee/Hip Exercises Bridging with feet up on yellow physioball with hamstring curls pulling ball toward hips x 10 x 2;                         [facility] Goals - 11/15/14 1227    Additional Goals   Additional Goals Yes            Plan - 11/15/14 0929    Clinical Impression Statement Patient, when asked, said she wanted more challenges today.  She was challenged by some of the lunge/squat walking and by bridging exercises.  Pain at 2/10 at end of session today, after bridging exercises.  Patient was asked to see how long the pain continued after her session.  She reports being ready for discharge and is grateful for therapy having helped her.  Pt will benefit from skilled therapeutic intervention in order to improve on the following deficits Abnormal gait;Difficulty walking;Decreased safety awareness;Decreased endurance;Decreased activity tolerance;Decreased knowledge of precautions;Pain;Decreased knowledge of use of DME;Decreased strength   Rehab Potential Good   Clinical Impairments Affecting Rehab Potential lung cancer with lesions to brain, lung, bone and adrenal gland with large lesion at left pelvis and other bony areas.  Avoid added resistance to exercise   PT Frequency 2x / week   PT Duration 4 weeks   PT Treatment/Interventions Therapeutic exercise;Balance training   PT Next Visit Plan Check sit to stand status and goals.  Check on HEP; consider adding bridging exercises to those.  Check goals. Discharge next visit.        Problem List Patient Active Problem List   Diagnosis Date Noted  .  Encounter for antineoplastic chemotherapy 11/02/2014  . Mucositis 10/22/2014  . Rash 10/08/2014  . Non-small cell carcinoma of lung, stage 4 ([place]) 08/26/2014  . Bone disease, metabolic 09/31/1216  . Brain metastases ([place])   . Palliative care encounter   . Adrenal mass, left ([place])   . CAP (community acquired pneumonia) 08/07/2014  . Pneumonia 08/07/2014  . Lung mass 08/07/2014  . Hemoptysis 08/07/2014    SALISBURY,DONNA 11/15/2014, 12:28 PM  [place], [place], [address] Phone: [phone]   Fax:  [phone]  Name: JEANNIFER DRAKEFORD MRN: [id number] Date of Birth: December 20, 1970    Serafina Royals, PT 11/15/2014 12:28 PM

## 2014-11-17 ENCOUNTER — Ambulatory Visit: Payer: No Typology Code available for payment source | Attending: Radiation Oncology | Admitting: Physical Therapy

## 2014-11-17 DIAGNOSIS — R269 Unspecified abnormalities of gait and mobility: Secondary | ICD-10-CM | POA: Diagnosis present

## 2014-11-17 DIAGNOSIS — R29898 Other symptoms and signs involving the musculoskeletal system: Secondary | ICD-10-CM | POA: Diagnosis present

## 2014-11-17 NOTE — Therapy (Addendum)
[place], [place], [address] Phone: [phone]   Fax:  [phone]  Physical Therapy Treatment  Patient Details  Name: Deborah Foster MRN: [id number] Date of Birth: 1970-05-18 No Data Recorded  Encounter Date: 11/17/2014      PT End of Session - 11/17/14 1215    Visit Number 17   Number of Visits 17   Date for PT Re-Evaluation 11/22/14   PT Start Time 0844   PT Stop Time 0932   PT Time Calculation (min) 48 min   Activity Tolerance Patient tolerated treatment well   Behavior During Therapy [facility] for tasks assessed/performed      Past Medical History  Diagnosis Date  . Cancer ([place])   . Pneumonia   . Hemoptysis   . Adrenal cancer ([place])   . Lung mass   . Hypokalemia   . Radiation 08/23/14-09/07/14    Brain/chest and left hip 30 Gy 12 Fx    Past Surgical History  Procedure Laterality Date  . Video bronchoscopy Bilateral 08/09/2014    Procedure: VIDEO BRONCHOSCOPY WITHOUT FLUORO;  Surgeon: [name], MD;  Location: [place];  Service: Cardiopulmonary;  Laterality: Bilateral;    There were no vitals filed for this visit.  Visit Diagnosis:  Left leg weakness  Gait abnormality      Subjective Assessment - 11/17/14 0849    Subjective Nothing new.  Pain didn't continue very long after last session.   Currently in Pain? Yes   Pain Score 2    Pain Location Hip   Pain Orientation Left            [facility] PT Assessment - 11/17/14 0001    Functional Tests   Functional tests --   Sit to Stand   Comments 16 times in 30 seconds                     [facility] Adult PT Treatment/Exercise - 11/17/14 0001    Knee/Hip Exercises: Aerobic   Nustep Level 5 x 10 mins.   Therapist assessing readiness for DC during this exercise   Knee/Hip Exercises: Supine   Other Supine Knee/Hip Exercises Bridging with right, then left knee extension kick x 10 x 2; bridging with 5 marches on each  foot x 5 sets x 2   Other Supine Knee/Hip Exercises Bridging with feet up on yellow physioball with hamstring curls pulling ball toward hips x 10 x 3.  Bridging with alternating right/left shoulder flexion x 5, 5 sets x 2.                PT Education - 11/17/14 1216    Education provided Yes   Education Details bridging exercises as per pt. instructions section   Person(s) Educated Patient   Methods Explanation;Demonstration;Handout   Comprehension Verbalized understanding;Returned demonstration                [facility] Goals - 11/17/14 [phone]    CC Long Term Goal  #1   Title Patient will be independent in a home exercise program   Status Achieved   CC Long Term Goal  #2   Title pt will verbalize understanding of core stabalizations and ways to incorporate into daily activities for safety    Status Achieved   CC Long Term Goal  #3   Title Pt will be able to walk for 5 minutes without stopping with assistive device as needed so that she  will be able to walk in the community without another person nearby   Status Achieved   CC Long Term Goal  #4   Title pt will be independent in sit to supine and return with minimal rest stops so she can be on her own at home.   Status Achieved   CC Long Term Goal  #5   Title pt will be able to complete 14  repetitions of sit to stand in 30 seconds demonstrating functional strength improvement    Baseline Did 16 today (11/17/14).   Status Achieved   CC Long Term Goal  #6   Title pt will have 4/5 muscle strength in left hip abduction and extension so that she will perform daily activites with greater ease    Status Deferred            Plan - 11/17/14 1215    Clinical Impression Statement Patient is much improved, has a home exercise program in place, and is now ready for discharge.  We added a few items to her home exercise program today to increase her hip strength.   Pt will benefit from skilled therapeutic intervention  in order to improve on the following deficits Abnormal gait;Difficulty walking;Decreased safety awareness;Decreased endurance;Decreased activity tolerance;Decreased knowledge of precautions;Pain;Decreased knowledge of use of DME;Decreased strength   Clinical Impairments Affecting Rehab Potential lung cancer with lesions to brain, lung, bone and adrenal gland with large lesion at left pelvis and other bony areas.  Avoid added resistance to exercise   PT Treatment/Interventions Therapeutic exercise   PT Next Visit Plan None; discharge today.   PT Home Exercise Plan see education section   Consulted and Agree with Plan of Care Patient        Problem List Patient Active Problem List   Diagnosis Date Noted  . Encounter for antineoplastic chemotherapy 11/02/2014  . Mucositis 10/22/2014  . Rash 10/08/2014  . Non-small cell carcinoma of lung, stage 4 ([place]) 08/26/2014  . Bone disease, metabolic 88/91/6945  . Brain metastases ([place])   . Palliative care encounter   . Adrenal mass, left ([place])   . CAP (community acquired pneumonia) 08/07/2014  . Pneumonia 08/07/2014  . Lung mass 08/07/2014  . Hemoptysis 08/07/2014    SALISBURY,DONNA 11/17/2014, 12:20 PM  [place], [place], [address] Phone: [phone]   Fax:  [phone]  Name: Deborah Foster MRN: [id number] Date of Birth: December 03, 1970    PHYSICAL THERAPY DISCHARGE SUMMARY  Visits from Start of Care: 17  Current functional level related to goals / functional outcomes: She is doing remarkable well, given her diagnosis and the presence of metastases. All goals met as noted above.   Remaining deficits: Still with some weakness and pain that comes on in left hip and thigh. Gait is fairly normal until she fatigues.   Education / Equipment: Home exercise program for strengthening and balance. Plan: Patient agrees to discharge.  Patient goals were met. Patient  is being discharged due to meeting the stated rehab goals.  She is ready for discharge and to continue home exercise program independently.?????   Serafina Royals, PT 11/17/2014 12:20 PM

## 2014-11-17 NOTE — Patient Instructions (Addendum)
Bridging    Slowly raise buttocks from floor, keeping stomach tight.  While holding this position, "march" or "step" with right, then left foot 5 times.  Repeat __5__ times per set. Do _2___ sets per session. Do _1___ sessions per day.  http://orth.exer.us/1097   Copyright  VHI. All rights reserved.  Bridging: with Straight Leg Raise    With legs bent, lift buttocks __6__ inches from floor. Then slowly extend right knee, keeping stomach tight.  Bring right foot down and then extend left knee. Repeat __10__ times per set. Do __2__ sets per session. Do __1Bridging    Slowly raise buttocks from floor, keeping stomach tight.  While holding this position, raise one arm up overhead, then bring that arm down while raising the opposite arm up.  Do five repetitions of swinging your arms like this. Repeat __5__ times per set. Do __2__ sets per session. Do __1__ sessions per day.  http://orth.exer.us/1097   Copyright  VHI. All rights reserved.  __ sessions per day.  http://orth.exer.us/1105   Copyright  VHI. All rights reserved.   Copyright  VHI. All rights reserved.

## 2014-11-22 ENCOUNTER — Ambulatory Visit (HOSPITAL_COMMUNITY): Payer: No Typology Code available for payment source

## 2014-11-22 ENCOUNTER — Encounter: Payer: Self-pay | Admitting: Nurse Practitioner

## 2014-11-22 ENCOUNTER — Other Ambulatory Visit (HOSPITAL_BASED_OUTPATIENT_CLINIC_OR_DEPARTMENT_OTHER): Payer: No Typology Code available for payment source

## 2014-11-22 ENCOUNTER — Telehealth: Payer: Self-pay | Admitting: Internal Medicine

## 2014-11-22 ENCOUNTER — Other Ambulatory Visit: Payer: Self-pay | Admitting: Nurse Practitioner

## 2014-11-22 ENCOUNTER — Telehealth: Payer: Self-pay

## 2014-11-22 ENCOUNTER — Ambulatory Visit (HOSPITAL_BASED_OUTPATIENT_CLINIC_OR_DEPARTMENT_OTHER): Payer: No Typology Code available for payment source | Admitting: Nurse Practitioner

## 2014-11-22 VITALS — BP 104/45 | HR 104 | Temp 97.4°F | Resp 18 | Ht 60.0 in | Wt 103.9 lb

## 2014-11-22 DIAGNOSIS — R21 Rash and other nonspecific skin eruption: Secondary | ICD-10-CM | POA: Diagnosis not present

## 2014-11-22 DIAGNOSIS — C3490 Malignant neoplasm of unspecified part of unspecified bronchus or lung: Secondary | ICD-10-CM

## 2014-11-22 DIAGNOSIS — C342 Malignant neoplasm of middle lobe, bronchus or lung: Secondary | ICD-10-CM

## 2014-11-22 DIAGNOSIS — J40 Bronchitis, not specified as acute or chronic: Secondary | ICD-10-CM

## 2014-11-22 DIAGNOSIS — C7951 Secondary malignant neoplasm of bone: Secondary | ICD-10-CM | POA: Diagnosis not present

## 2014-11-22 DIAGNOSIS — G893 Neoplasm related pain (acute) (chronic): Secondary | ICD-10-CM

## 2014-11-22 LAB — COMPREHENSIVE METABOLIC PANEL (CC13)
ALBUMIN: 3 g/dL — AB (ref 3.5–5.0)
ALK PHOS: 98 U/L (ref 40–150)
ALT: <9 U/L (ref 0–55)
ANION GAP: 8 meq/L (ref 3–11)
AST: 13 U/L (ref 5–34)
BILIRUBIN TOTAL: 0.65 mg/dL (ref 0.20–1.20)
BUN: 4.9 mg/dL — ABNORMAL LOW (ref 7.0–26.0)
CALCIUM: 8.7 mg/dL (ref 8.4–10.4)
CO2: 24 mEq/L (ref 22–29)
Chloride: 110 mEq/L — ABNORMAL HIGH (ref 98–109)
Creatinine: 0.7 mg/dL (ref 0.6–1.1)
GLUCOSE: 98 mg/dL (ref 70–140)
POTASSIUM: 3.6 meq/L (ref 3.5–5.1)
SODIUM: 142 meq/L (ref 136–145)
Total Protein: 6.2 g/dL — ABNORMAL LOW (ref 6.4–8.3)

## 2014-11-22 LAB — CBC WITH DIFFERENTIAL/PLATELET
BASO%: 0.5 % (ref 0.0–2.0)
BASOS ABS: 0 10*3/uL (ref 0.0–0.1)
EOS ABS: 0.1 10*3/uL (ref 0.0–0.5)
EOS%: 1.9 % (ref 0.0–7.0)
HCT: 39.8 % (ref 34.8–46.6)
HEMOGLOBIN: 13.3 g/dL (ref 11.6–15.9)
LYMPH%: 20.5 % (ref 14.0–49.7)
MCH: 30.7 pg (ref 25.1–34.0)
MCHC: 33.4 g/dL (ref 31.5–36.0)
MCV: 92 fL (ref 79.5–101.0)
MONO#: 0.4 10*3/uL (ref 0.1–0.9)
MONO%: 8.2 % (ref 0.0–14.0)
NEUT#: 3.6 10*3/uL (ref 1.5–6.5)
NEUT%: 68.9 % (ref 38.4–76.8)
PLATELETS: 477 10*3/uL — AB (ref 145–400)
RBC: 4.33 10*6/uL (ref 3.70–5.45)
RDW: 13.6 % (ref 11.2–14.5)
WBC: 5.2 10*3/uL (ref 3.9–10.3)
lymph#: 1.1 10*3/uL (ref 0.9–3.3)

## 2014-11-22 MED ORDER — DOXYCYCLINE HYCLATE 100 MG PO TABS: 100.0000 mg | ORAL_TABLET | Freq: Two times a day (BID) | ORAL | Status: DC

## 2014-11-22 NOTE — Progress Notes (Signed)
SYMPTOM MANAGEMENT CLINIC   HPI: Deborah Foster 44 y.o. female diagnosed with lung cancer; with bone/brain/adrenal gland metastasis.  Currently undergoing Tarceva oral therapy.  Patient reports progressive, chronic cough that is keeping her awake at night.  She states she is coughing so much that is making her upper abdomen sore.  She denies any chest pain, chest pressure, or pain with inspiration.  She does occasionally feel short of breath.  She denies any recent fevers or chills.  Patient states that she presented to her primary care physician this past Friday, 11/19/2014 for same symptoms.  He prescribed Phenergan with codeine cough syrup.  She states the constant does help; but she does continue with the nagging cough.  HPI  ROS  Past Medical History  Diagnosis Date  . Cancer ([place])   . Pneumonia   . Hemoptysis   . Adrenal cancer ([place])   . Lung mass   . Hypokalemia   . Radiation 08/23/14-09/07/14    Brain/chest and left hip 30 Gy 12 Fx    Past Surgical History  Procedure Laterality Date  . Video bronchoscopy Bilateral 08/09/2014    Procedure: VIDEO BRONCHOSCOPY WITHOUT FLUORO;  Surgeon: [name], MD;  Location: [facility];  Service: Cardiopulmonary;  Laterality: Bilateral;    has CAP (community acquired pneumonia); Pneumonia; Lung mass; Hemoptysis; Adrenal mass, left ([place]); Brain metastases ([place]); Palliative care encounter; Non-small cell carcinoma of lung, stage 4 ([place]); Bone disease, metabolic; Rash; Mucositis; Encounter for antineoplastic chemotherapy; and Bronchitis on her problem list.    has No Known Allergies.    Medication List       This list is accurate as of: 11/22/14  5:57 PM.  Always use your most recent med list.               albuterol 108 (90 BASE) MCG/ACT inhaler  Commonly known as:  PROVENTIL HFA;VENTOLIN HFA  Inhale 2 puffs into the lungs every 6 (six) hours as needed for wheezing or shortness of breath.     clindamycin 1 % gel  Commonly  known as:  CLINDAGEL  Apply topically 2 (two) times daily.     doxycycline 100 MG tablet  Commonly known as:  VIBRA-TABS  Take 1 tablet (100 mg total) by mouth 2 (two) times daily.     erlotinib 150 MG tablet  Commonly known as:  TARCEVA  Take 1 tablet (150 mg total) by mouth daily. Take on an empty stomach 1 hour before meals or 2 hours after.     guaiFENesin 600 MG 12 hr tablet  Commonly known as:  MUCINEX  Take 600 mg by mouth 2 (two) times daily.     magic mouthwash w/lidocaine Soln  Take 5 mLs by mouth 4 (four) times daily as needed.     methylPREDNISolone 4 MG Tbpk tablet  Commonly known as:  MEDROL DOSEPAK  Use as instructed     ondansetron 4 MG tablet  Commonly known as:  ZOFRAN  Take 1 tablet (4 mg total) by mouth every 8 (eight) hours as needed for nausea or vomiting.     potassium chloride SA 20 MEQ tablet  Commonly known as:  K-DUR,KLOR-CON  Take 2 tablets (40 mEq total) by mouth daily.     promethazine-codeine 6.25-10 MG/5ML syrup  Commonly known as:  PHENERGAN with CODEINE  Take 5 mLs by mouth every 4 (four) hours as needed.     Turmeric Curcumin Caps  Take 750 capsules by mouth.  UNABLE TO FIND  Take 650 mg by mouth. Med Name:corduceps sinesis         PHYSICAL EXAMINATION  Oncology Vitals 11/22/2014 11/02/2014 10/29/2014 10/22/2014 10/14/2014 10/08/2014 09/24/2014  Height 152 cm 152 cm 152 cm 150 cm - 150 cm 150 cm  Weight 47.129 kg 46.494 kg 47.174 kg 47.219 kg 47.174 kg 48.127 kg 47.038 kg  Weight (lbs) 103 lbs 14 oz 102 lbs 8 oz 104 lbs 104 lbs 2 oz 104 lbs 106 lbs 2 oz 103 lbs 11 oz  BMI (kg/m2) 20.29 kg/m2 20.02 kg/m2 20.31 kg/m2 21.03 kg/m2 - 21.43 kg/m2 20.94 kg/m2  Temp 97.4 98.4 - 97.7 97.9 98.1 98.4  Pulse 104 87 - 90 104 76 96  Resp 18 18 - _0 SpO2 98 100 - 99 - 100 100  BSA (m2) 1.41 m2 1.4 m2 1.41 m2 1.4 m2 - 1.42 m2 1.4 m2   BP Readings from Last 3 Encounters:  11/22/14 104/45  11/02/14 99/60  10/22/14 110/59     Physical Exam  Constitutional: She is oriented to person, place, and time and well-developed, well-nourished, and in no distress.  HENT:  Head: Normocephalic and atraumatic.  Mouth/Throat: Oropharynx is clear and moist.  Eyes: Conjunctivae and EOM are normal. Pupils are equal, round, and reactive to light. Right eye exhibits no discharge. Left eye exhibits no discharge. No scleral icterus.  Neck: Normal range of motion. Neck supple. No JVD present. No tracheal deviation present. No thyromegaly present.  Cardiovascular: Normal rate, regular rhythm, normal heart sounds and intact distal pulses.   Pulmonary/Chest: Effort normal. No respiratory distress. She has no wheezes. She has rales. She exhibits no tenderness.  Abdominal: Soft. Bowel sounds are normal. She exhibits no distension and no mass. There is no tenderness. There is no rebound and no guarding.  Musculoskeletal: Normal range of motion. She exhibits no edema or tenderness.  Lymphadenopathy:    She has no cervical adenopathy.  Neurological: She is alert and oriented to person, place, and time. Gait normal.  Skin: Skin is warm and dry. Rash noted. No erythema. No pallor.  Patient with rash to entire face.  The rash is slightly red and has multiple pustules.  Psychiatric: Affect normal.  Nursing note and vitals reviewed.   LABORATORY DATA:. Appointment on 11/22/2014  Component Date Value Ref Range Status  . WBC 11/22/2014 5.2  3.9 - 10.3 10e3/uL Final  . NEUT# 11/22/2014 3.6  1.5 - 6.5 10e3/uL Final  . HGB 11/22/2014 13.3  11.6 - 15.9 g/dL Final  . HCT 11/22/2014 39.8  34.8 - 46.6 % Final  . Platelets 11/22/2014 477* 145 - 400 10e3/uL Final  . MCV 11/22/2014 92.0  79.5 - 101.0 fL Final  . MCH 11/22/2014 30.7  25.1 - 34.0 pg Final  . MCHC 11/22/2014 33.4  31.5 - 36.0 g/dL Final  . RBC 11/22/2014 4.33  3.70 - 5.45 10e6/uL Final  . RDW 11/22/2014 13.6  11.2 - 14.5 % Final  . lymph# 11/22/2014 1.1  0.9 - 3.3 10e3/uL Final  .  MONO# 11/22/2014 0.4  0.1 - 0.9 10e3/uL Final  . Eosinophils Absolute 11/22/2014 0.1  0.0 - 0.5 10e3/uL Final  . Basophils Absolute 11/22/2014 0.0  0.0 - 0.1 10e3/uL Final  . NEUT% 11/22/2014 68.9  38.4 - 76.8 % Final  . LYMPH% 11/22/2014 20.5  14.0 - 49.7 % Final  . MONO% 11/22/2014 8.2  0.0 - 14.0 % Final  . EOS% 11/22/2014 1.9  0.0 - 7.0 % Final  . BASO% 11/22/2014 0.5  0.0 - 2.0 % Final  . Sodium 11/22/2014 142  136 - 145 mEq/L Final  . Potassium 11/22/2014 3.6  3.5 - 5.1 mEq/L Final  . Chloride 11/22/2014 110* 98 - 109 mEq/L Final  . CO2 11/22/2014 24  22 - 29 mEq/L Final  . Glucose 11/22/2014 98  70 - 140 mg/dl Final   Glucose reference range is for nonfasting patients. Fasting glucose reference range is 70- 100.  Marland Kitchen BUN 11/22/2014 4.9* 7.0 - 26.0 mg/dL Final  . Creatinine 11/22/2014 0.7  0.6 - 1.1 mg/dL Final  . Total Bilirubin 11/22/2014 0.65  0.20 - 1.20 mg/dL Final  . Alkaline Phosphatase 11/22/2014 98  40 - 150 U/L Final  . AST 11/22/2014 13  5 - 34 U/L Final  . ALT 11/22/2014 <9  0 - 55 U/L Final  . Total Protein 11/22/2014 6.2* 6.4 - 8.3 g/dL Final  . Albumin 11/22/2014 3.0* 3.5 - 5.0 g/dL Final  . Calcium 11/22/2014 8.7  8.4 - 10.4 mg/dL Final  . Anion Gap 11/22/2014 8  3 - 11 mEq/L Final  . EGFR 11/22/2014 >90  >90 ml/min/1.73 m2 Final   eGFR is calculated using the CKD-EPI Creatinine Equation (2009)     RADIOGRAPHIC STUDIES: No results found.  ASSESSMENT/PLAN:    Non-small cell carcinoma of lung, stage 4 (HCC) Patient is status post radiation treatments to her pelvis.  Bone metastasis for pain control.  Patient continues to take Tarceva oral chemotherapy as directed.  Patient was initially scheduled for her next restaging chest CT on 11/30/2014; but will move up patients.  Restaging CT to this coming Wednesday, 11/24/2014 for further evaluation.  Patient is scheduled to return on 11/30/2014 for labs and a follow-up visit to review scan results.  Rash Patient  continues with her Tarceva-induced rash to her face.  She states that it is basically stable at this time.  On exam I can.  Patient does have dry, red rash with some pustules to her face.  She has been using the clindamycin lotion to her face as directed.  Will treat patient for bronchitis today; and will prescribe doxycycline-which will also help with the rash to her face.  Will continue to monitor closely.  Bronchitis Patient reports progressive, chronic cough that is keeping her awake at night.  She states she is coughing so much that is making her upper abdomen sore.  She denies any chest pain, chest pressure, or pain with inspiration.  She does occasionally feel short of breath.  She denies any recent fevers or chills.  Patient states that she presented to her primary care physician this past Friday, 11/19/2014 for same symptoms.  He prescribed Phenergan with codeine cough syrup.  She states the constant does help; but she does continue with the nagging cough.  On exam.-Patient with rhonchi to all lung fields.  He should does not appear to be in respiratory distress.  Vital signs stable; and O2 sat 98% on room air.  However, will move up patients.  Restaging CT from 11/30/2014 to 11/24/2014 for further evaluation.  Patient was advised to call/return to go directly to the emergency department for any worsening symptoms whatsoever.  Patient stated understanding of all instructions; and was in agreement with this plan of care. The patient knows to call the clinic with any problems, questions or concerns.   This was a shared visit with Dr. Julien Nordmann today.  Total time spent with  patient was 25 minutes;  with greater than 75 percent of that time spent in face to face counseling regarding patient's symptoms,  and coordination of care and follow up.  Disclaimer:This dictation was prepared with Dragon/digital dictation along with [employer]. Any transcriptional errors that result from  this process are unintentional.  Drue Second, NP 11/22/2014   ADDENDUM: Hematology/Oncology Attending: I had a face to face encounter with the patient. I recommended her care plan. This is a very pleasant 44 years old Asian female with a stage IV non-small cell lung cancer, adenocarcinoma with positive EGFR mutation who is currently undergoing treatment with Tarceva 150 mg by mouth daily and tolerating her treatment fairly well except for skin rash. The patient presented with cough and upper abdominal soreness most likely secondary to her cough. She was seen by her primary care physician recently and she was giving a cough medicine with Phenergan and codeine. I recommended for the patient to continue with her cough medicine for now. We will arrange for her to have repeat CT scan of the chest, abdomen and pelvis for evaluation of her disease and also to rule out any other pulmonary abnormalities. We will also start the patient empirically on doxycycline 100 mg by mouth twice a day for the next 7 days. The patient would come back for follow-up visit next week for evaluation or sooner if needed. She was also advised to go immediately to the emergency department if she has any worsening of her condition in the interval.  Disclaimer: This note was dictated with voice recognition software. Similar sounding words can inadvertently be transcribed and may be missed upon review. Eilleen Kempf., MD 11/23/2014

## 2014-11-22 NOTE — Telephone Encounter (Signed)
Pt lvm she is having a lot of coughing and difficult breathing.

## 2014-11-22 NOTE — Assessment & Plan Note (Signed)
Patient is status post radiation treatments to her pelvis.  Bone metastasis for pain control.  Patient continues to take Tarceva oral chemotherapy as directed.  Patient was initially scheduled for her next restaging chest CT on 11/30/2014; but will move up patients.  Restaging CT to this coming Wednesday, 11/24/2014 for further evaluation.  Patient is scheduled to return on 11/30/2014 for labs and a follow-up visit to review scan results.

## 2014-11-22 NOTE — Telephone Encounter (Signed)
Non productive cough. No fever. Making her tired. Saw PCP Friday, he gave promethazine with codeine. She is coughing a lot and making her tired. She is requesting any meds called to [facility] outpatient pharmacy. She is asking if her CT can be done earlier. Her abdomen is tender below her belly button, hurts with pressure. LBM today constipation. S/w Dr Julien Nordmann and set up a [facility] today.

## 2014-11-22 NOTE — Assessment & Plan Note (Signed)
Patient reports progressive, chronic cough that is keeping her awake at night.  She states she is coughing so much that is making her upper abdomen sore.  She denies any chest pain, chest pressure, or pain with inspiration.  She does occasionally feel short of breath.  She denies any recent fevers or chills.  Patient states that she presented to her primary care physician this past Friday, 11/19/2014 for same symptoms.  He prescribed Phenergan with codeine cough syrup.  She states the constant does help; but she does continue with the nagging cough.  On exam.-Patient with rhonchi to all lung fields.  He should does not appear to be in respiratory distress.  Vital signs stable; and O2 sat 98% on room air.  However, will move up patients.  Restaging CT from 11/30/2014 to 11/24/2014 for further evaluation.  Patient was advised to call/return to go directly to the emergency department for any worsening symptoms whatsoever.

## 2014-11-22 NOTE — Telephone Encounter (Signed)
s.w. pt and advised on todays appt.....pt ok and aware °

## 2014-11-22 NOTE — Assessment & Plan Note (Signed)
Patient continues with her Tarceva-induced rash to her face.  She states that it is basically stable at this time.  On exam I can.  Patient does have dry, red rash with some pustules to her face.  She has been using the clindamycin lotion to her face as directed.  Will treat patient for bronchitis today; and will prescribe doxycycline-which will also help with the rash to her face.  Will continue to monitor closely.

## 2014-11-23 ENCOUNTER — Telehealth: Payer: Self-pay | Admitting: *Deleted

## 2014-11-23 NOTE — Telephone Encounter (Signed)
Received voicemail message from pt @ 1007 am , stating that she is having problems with new antibiotic she was prescribed yesterday.  TC to patient and spoke with her. She states that she saw Selena Lesser , NP yesterday and is being treated for bronchitis with doxycycline 100 mg BID. She states she took it this morning and has vomited it back. She is asking if she should continue to take. Please advise.

## 2014-11-24 ENCOUNTER — Encounter (HOSPITAL_COMMUNITY): Payer: Self-pay

## 2014-11-24 ENCOUNTER — Ambulatory Visit (HOSPITAL_COMMUNITY)
Admission: RE | Admit: 2014-11-24 | Discharge: 2014-11-24 | Disposition: A | Discharge status: Home / Self Care | Payer: No Typology Code available for payment source | Source: Ambulatory Visit | Attending: Internal Medicine | Admitting: Internal Medicine

## 2014-11-24 ENCOUNTER — Telehealth: Payer: Self-pay | Admitting: *Deleted

## 2014-11-24 DIAGNOSIS — C7972 Secondary malignant neoplasm of left adrenal gland: Secondary | ICD-10-CM | POA: Insufficient documentation

## 2014-11-24 DIAGNOSIS — Z923 Personal history of irradiation: Secondary | ICD-10-CM | POA: Diagnosis not present

## 2014-11-24 DIAGNOSIS — Z08 Encounter for follow-up examination after completed treatment for malignant neoplasm: Secondary | ICD-10-CM | POA: Diagnosis present

## 2014-11-24 DIAGNOSIS — C7931 Secondary malignant neoplasm of brain: Secondary | ICD-10-CM | POA: Insufficient documentation

## 2014-11-24 DIAGNOSIS — C3491 Malignant neoplasm of unspecified part of right bronchus or lung: Secondary | ICD-10-CM | POA: Insufficient documentation

## 2014-11-24 DIAGNOSIS — M899 Disorder of bone, unspecified: Secondary | ICD-10-CM | POA: Insufficient documentation

## 2014-11-24 HISTORY — DX: Secondary malignant neoplasm of brain: C79.31

## 2014-11-24 HISTORY — DX: Secondary malignant neoplasm of unspecified adrenal gland: C79.70

## 2014-11-24 MED ORDER — IOHEXOL 300 MG/ML  SOLN
100.0000 mL | Freq: Once | INTRAMUSCULAR | Status: AC | PRN
  Administered 2014-11-24: 80 mL via INTRAVENOUS

## 2014-11-24 NOTE — Telephone Encounter (Signed)
Called pt to inquire if pt is taking abx on an empty stomach or if she has tried taking it with food. Person answering phone did not speak English and asked to try again in 30 minutes.

## 2014-11-30 ENCOUNTER — Other Ambulatory Visit (HOSPITAL_BASED_OUTPATIENT_CLINIC_OR_DEPARTMENT_OTHER): Payer: No Typology Code available for payment source

## 2014-11-30 ENCOUNTER — Ambulatory Visit (HOSPITAL_COMMUNITY): Payer: No Typology Code available for payment source

## 2014-11-30 DIAGNOSIS — C7931 Secondary malignant neoplasm of brain: Secondary | ICD-10-CM

## 2014-11-30 DIAGNOSIS — C3491 Malignant neoplasm of unspecified part of right bronchus or lung: Secondary | ICD-10-CM

## 2014-11-30 DIAGNOSIS — C342 Malignant neoplasm of middle lobe, bronchus or lung: Secondary | ICD-10-CM | POA: Diagnosis not present

## 2014-11-30 LAB — CBC WITH DIFFERENTIAL/PLATELET
BASO%: 0.4 % (ref 0.0–2.0)
Basophils Absolute: 0 10*3/uL (ref 0.0–0.1)
EOS%: 2.6 % (ref 0.0–7.0)
Eosinophils Absolute: 0.1 10*3/uL (ref 0.0–0.5)
HCT: 41.3 % (ref 34.8–46.6)
HEMOGLOBIN: 13.7 g/dL (ref 11.6–15.9)
LYMPH%: 14.1 % (ref 14.0–49.7)
MCH: 30.8 pg (ref 25.1–34.0)
MCHC: 33.2 g/dL (ref 31.5–36.0)
MCV: 92.8 fL (ref 79.5–101.0)
MONO#: 0.5 10*3/uL (ref 0.1–0.9)
MONO%: 9.5 % (ref 0.0–14.0)
NEUT%: 73.4 % (ref 38.4–76.8)
NEUTROS ABS: 4 10*3/uL (ref 1.5–6.5)
Platelets: 397 10*3/uL (ref 145–400)
RBC: 4.45 10*6/uL (ref 3.70–5.45)
RDW: 13 % (ref 11.2–14.5)
WBC: 5.4 10*3/uL (ref 3.9–10.3)
lymph#: 0.8 10*3/uL — ABNORMAL LOW (ref 0.9–3.3)

## 2014-11-30 LAB — COMPREHENSIVE METABOLIC PANEL (CC13)
ALT: 9 U/L (ref 0–55)
AST: 15 U/L (ref 5–34)
Albumin: 3.1 g/dL — ABNORMAL LOW (ref 3.5–5.0)
Alkaline Phosphatase: 101 U/L (ref 40–150)
Anion Gap: 9 mEq/L (ref 3–11)
BILIRUBIN TOTAL: 0.79 mg/dL (ref 0.20–1.20)
BUN: 4.2 mg/dL — AB (ref 7.0–26.0)
CO2: 27 meq/L (ref 22–29)
Calcium: 8.9 mg/dL (ref 8.4–10.4)
Chloride: 107 mEq/L (ref 98–109)
Creatinine: 0.7 mg/dL (ref 0.6–1.1)
EGFR: >90 mL/min/{1.73_m2} (ref >90)
GLUCOSE: 91 mg/dL (ref 70–140)
Potassium: 3.8 mEq/L (ref 3.5–5.1)
SODIUM: 143 meq/L (ref 136–145)
TOTAL PROTEIN: 6.3 g/dL — AB (ref 6.4–8.3)

## 2014-12-07 ENCOUNTER — Ambulatory Visit (HOSPITAL_BASED_OUTPATIENT_CLINIC_OR_DEPARTMENT_OTHER): Payer: No Typology Code available for payment source | Admitting: Internal Medicine

## 2014-12-07 ENCOUNTER — Telehealth: Payer: Self-pay | Admitting: Internal Medicine

## 2014-12-07 ENCOUNTER — Encounter: Payer: Self-pay | Admitting: Internal Medicine

## 2014-12-07 VITALS — BP 108/55 | HR 89 | Temp 98.5°F | Resp 18 | Ht 60.0 in | Wt 102.7 lb

## 2014-12-07 DIAGNOSIS — C3491 Malignant neoplasm of unspecified part of right bronchus or lung: Secondary | ICD-10-CM

## 2014-12-07 DIAGNOSIS — C7931 Secondary malignant neoplasm of brain: Secondary | ICD-10-CM | POA: Diagnosis not present

## 2014-12-07 MED ORDER — PROCHLORPERAZINE MALEATE 10 MG PO TABS
10.0000 mg | ORAL_TABLET | Freq: Four times a day (QID) | ORAL | Status: DC | PRN
Start: 2014-12-07 — End: 2015-06-06

## 2014-12-07 MED ORDER — METHYLPREDNISOLONE 4 MG PO TBPK: ORAL_TABLET | ORAL | Status: DC

## 2014-12-07 NOTE — Progress Notes (Signed)
[facility] [phone]   [phone]  OFFICE PROGRESS NOTE  [name], MD [place] [address]  DIAGNOSIS: Stage IV (T2a, N1, M1b) non-small cell lung cancer, adenocarcinoma presented with right middle lobe lung mass in addition to right hilar adenopathy as well as metastatic disease to the bone, brain as well as left adrenal metastasis diagnosed in July 2016  PRIOR THERAPY: The patient is currently undergoing palliative radiotherapy to the brain as well as metastatic bone lesions in the pelvis.  CURRENT THERAPY: Tarceva 150 mg by mouth daily started on 09/25/2014.  INTERVAL HISTORY: Deborah Foster 44 y.o. female returns to the clinic today for hospital follow-up visit accompanied by her interpreter. The patient is tolerating her current treatment with Tarceva fairly well except for grade 2 skin rash mainly on the face and upper chest. She was recently treated with doxycycline orally and addition to clindamycin lotion. She was unable to tolerate her treatment with doxycycline because of nausea and she stop the medication and her own. She denied having any significant diarrhea. She continues to have mild cough and she is currently on Mucinex and Phenergan with codeine. She has no significant weight loss.  She denied having any significant chest pain, shortness breath, or hemoptysis. The patient denied having any headache or visual changes. She has no significant nausea or vomiting. She has no fever or chills. She had repeat CT scan of the chest, abdomen and pelvis performed recently and she is here for evaluation and discussion of her scan results.  MEDICAL HISTORY: Past Medical History  Diagnosis Date  . Pneumonia   . Hemoptysis   . Lung mass   . Hypokalemia   . Radiation 08/23/14-09/07/14    Brain/chest and left hip 30 Gy 12 Fx  . lung ca dx'd 07/2014  . Metastasis to brain ([facility])   . Metastasis to adrenal gland ([facility])      ALLERGIES:  is allergic to doxycycline.  MEDICATIONS:  Current Outpatient Prescriptions  Medication Sig Dispense Refill  . albuterol (PROVENTIL HFA;VENTOLIN HFA) 108 (90 BASE) MCG/ACT inhaler Inhale 2 puffs into the lungs every 6 (six) hours as needed for wheezing or shortness of breath. 1 Inhaler 2  . clindamycin (CLINDAGEL) 1 % gel Apply topically 2 (two) times daily. 30 g 1  . doxycycline (VIBRA-TABS) 100 MG tablet Take 1 tablet (100 mg total) by mouth 2 (two) times daily. 20 tablet 0  . erlotinib (TARCEVA) 150 MG tablet Take 1 tablet (150 mg total) by mouth daily. Take on an empty stomach 1 hour before meals or 2 hours after. 30 tablet 2  . guaiFENesin (MUCINEX) 600 MG 12 hr tablet Take 600 mg by mouth 2 (two) times daily.    . magic mouthwash w/lidocaine SOLN Take 5 mLs by mouth 4 (four) times daily as needed.  1  . methylPREDNISolone (MEDROL DOSEPAK) 4 MG TBPK tablet Use as instructed 21 tablet 0  . Misc Natural Products (TURMERIC CURCUMIN) CAPS Take 750 capsules by mouth.    . ondansetron (ZOFRAN) 4 MG tablet Take 1 tablet (4 mg total) by mouth every 8 (eight) hours as needed for nausea or vomiting. 30 tablet 0  . potassium chloride SA (K-DUR,KLOR-CON) 20 MEQ tablet Take 2 tablets (40 mEq total) by mouth daily. 20 tablet 0  . promethazine-codeine (PHENERGAN WITH CODEINE) 6.25-10 MG/5ML syrup Take 5 mLs by mouth every 4 (four) hours as needed.  0  . UNABLE  TO FIND Take 650 mg by mouth. Med Name:corduceps sinesis    . acyclovir (ZOVIRAX) 400 MG tablet     . fluconazole (DIFLUCAN) 100 MG tablet     . HYDROMET 5-1.5 MG/5ML syrup      No current facility-administered medications for this visit.    SURGICAL HISTORY:  Past Surgical History  Procedure Laterality Date  . Video bronchoscopy Bilateral 08/09/2014    Procedure: VIDEO BRONCHOSCOPY WITHOUT FLUORO;  Surgeon: [name], MD;  Location: [facility];  Service: Cardiopulmonary;  Laterality: Bilateral;    REVIEW OF  SYSTEMS:  Constitutional: negative Eyes: negative Ears, nose, mouth, throat, and face: negative Respiratory: positive for cough Cardiovascular: negative Gastrointestinal: negative Genitourinary:negative Integument/breast: positive for dryness and rash Hematologic/lymphatic: negative Musculoskeletal:positive for bone pain Neurological: negative Behavioral/Psych: negative Endocrine: negative Allergic/Immunologic: negative   PHYSICAL EXAMINATION: General appearance: alert, cooperative, fatigued and no distress Head: Normocephalic, without obvious abnormality, atraumatic Neck: no adenopathy, no JVD, supple, symmetrical, trachea midline and thyroid not enlarged, symmetric, no tenderness/mass/nodules Lymph nodes: Cervical, supraclavicular, and axillary nodes normal. Resp: clear to auscultation bilaterally Back: symmetric, no curvature. ROM normal. No CVA tenderness. Cardio: regular rate and rhythm, S1, S2 normal, no murmur, click, rub or gallop GI: soft, non-tender; bowel sounds normal; no masses,  no organomegaly Extremities: extremities normal, atraumatic, no cyanosis or edema Neurologic: Alert and oriented X 3, normal strength and tone. Normal symmetric reflexes. Normal coordination and gait  Skin exam showed maculopapular grade 2 skin rash on the face and upper chest.  ECOG PERFORMANCE STATUS: 1 - Symptomatic but completely ambulatory  Blood pressure 108/55, pulse 89, temperature 98.5 F (36.9 C), resp. rate 18, height 5' (1.524 m), weight 102 lb 11.2 oz (46.584 kg), last menstrual period 08/24/2014, SpO2 99 %.  LABORATORY DATA: Lab Results  Component Value Date   WBC 5.4 11/30/2014   HGB 13.7 11/30/2014   HCT 41.3 11/30/2014   MCV 92.8 11/30/2014   PLT 397 11/30/2014      Chemistry      Component Value Date/Time   NA 143 11/30/2014 0849   NA 135 08/17/2014 0553   K 3.8 11/30/2014 0849   K 3.2* 08/17/2014 0553   CL 96* 08/17/2014 0553   CO2 27 11/30/2014 0849   CO2 26  08/17/2014 0553   BUN 4.2* 11/30/2014 0849   BUN <5* 08/17/2014 0553   CREATININE 0.7 11/30/2014 0849   CREATININE 0.41* 08/17/2014 0553      Component Value Date/Time   CALCIUM 8.9 11/30/2014 0849   CALCIUM 8.4* 08/17/2014 0553   ALKPHOS 101 11/30/2014 0849   ALKPHOS 145* 08/08/2014 0458   AST 15 11/30/2014 0849   AST 16 08/08/2014 0458   ALT 9 11/30/2014 0849   ALT 14 08/08/2014 0458   BILITOT 0.79 11/30/2014 0849   BILITOT 0.5 08/08/2014 0458       RADIOGRAPHIC STUDIES: Ct Chest W Contrast  11/24/2014  CLINICAL DATA:  Subsequent encounter for stage IV non-small-cell lung cancer with brain metastases. EXAM: CT CHEST, ABDOMEN, AND PELVIS WITH CONTRAST TECHNIQUE: Multidetector CT imaging of the chest, abdomen and pelvis was performed following the standard protocol during bolus administration of intravenous contrast. CONTRAST:  60m OMNIPAQUE IOHEXOL 300 MG/ML  SOLN COMPARISON:  PET-CT from 09/09/2014. CT abdomen and pelvis from 08/11/2014. Chest CT from 08/07/2014. FINDINGS: CT CHEST FINDINGS Mediastinum/Lymph Nodes: There is no axillary lymphadenopathy. No mediastinal lymphadenopathy. No evidence for left hilar lymphadenopathy. Right lower lobe mass is again identified with substantial disease  in the paramediastinal right lung. The heart size is normal. No pericardial effusion. The esophagus has normal CT imaging features. Lungs/Pleura: Interval development of confluent airspace disease in the medial right upper lobe right middle lobe common right lower lobe, presumably related to post radiation change. The right lower lobe lesion seen previously has decreased in attenuation potentially reflecting necrosis. Left lung is clear. Musculoskeletal: Mixed lytic and sclerotic lesion in the right humeral head compatible with known metastatic involvement. Metastatic disease is seen in the right clavicle medially in the anterior right third rib. CT ABDOMEN PELVIS FINDINGS Hepatobiliary: No focal  abnormality within the liver parenchyma. There is no evidence for gallstones, gallbladder wall thickening, or pericholecystic fluid. Stable mild biliary duct prominence. Pancreas: No focal mass lesion. No dilatation of the main duct. No intraparenchymal cyst. No peripancreatic edema. Spleen: No splenomegaly. No focal mass lesion. Adrenals/Urinary Tract: Right adrenal gland remains normal. Left adrenal mass has decreased substantially in the interval, measuring 2.0 x 0.9 cm today compared to 5.1 x 2.7 cm previously. Kidneys are unremarkable. No evidence for hydroureter. The urinary bladder appears normal for the degree of distention. Stomach/Bowel: Stomach is nondistended. No gastric wall thickening. No evidence of outlet obstruction. Duodenum is normally positioned as is the ligament of Treitz. No small bowel wall thickening. No small bowel dilatation. The terminal ileum is normal. The appendix is normal. No gross colonic mass. No colonic wall thickening. No substantial diverticular change. Vascular/Lymphatic: No abdominal aortic aneurysm. No abdominal atherosclerotic calcification. There is no gastrohepatic or hepatoduodenal ligament lymphadenopathy. No intraperitoneal or retroperitoneal lymphadenopy. No pelvic sidewall lymphadenopathy. Reproductive: The uterus has normal CT imaging appearance. There is no adnexal mass. Other: No intraperitoneal free fluid. Musculoskeletal: Large left acetabular lesion has become more mineralized/sclerotic in the interval, potentially reflecting a component of healing. 19 mm lytic lesion in the posterior left iliac bone (image 83 series 2) has progressed fairly substantially in the interval, measuring only 9 mm previously. A 19 mm sclerotic lesion in the left L1 vertebral body was not visible previously 15 mm mixed lytic and sclerotic L2 lesion measured about 12 mm on the previous study. 18 mm mixed lytic and sclerotic lesion in the left femoral neck was 17 mm previously.  IMPRESSION: 1. Interval increase in central low attenuation in the patient's known right lower lobe neoplasm, likely related to necrosis. The difference in lesion size is difficult to quantify given the adjacent radiation change today and postobstructive collapse on the prior study. The lesion is probably in the 27 x 19 mm size range today compared to 33 x 30 mm previously. 2. Interval new confluent airspace disease in the medial right lung, presumably sequelae of radiation therapy. Superimposed infection cannot be excluded by CT imaging. 3. Substantial interval decrease in size of the left adrenal metastasis. 4. Apparent mixed response of bone lesions. Some lesions have become more sclerotic in the interval without a change in size. The lesion in the posterior left iliac bone is still lytic in character and has clearly progressed in the interval. Electronically Signed   By: Misty Stanley M.D.   On: 11/24/2014 08:26   Ct Abdomen Pelvis W Contrast  11/24/2014  CLINICAL DATA:  Subsequent encounter for stage IV non-small-cell lung cancer with brain metastases. EXAM: CT CHEST, ABDOMEN, AND PELVIS WITH CONTRAST TECHNIQUE: Multidetector CT imaging of the chest, abdomen and pelvis was performed following the standard protocol during bolus administration of intravenous contrast. CONTRAST:  74m OMNIPAQUE IOHEXOL 300 MG/ML  SOLN COMPARISON:  PET-CT from 09/09/2014. CT abdomen and pelvis from 08/11/2014. Chest CT from 08/07/2014. FINDINGS: CT CHEST FINDINGS Mediastinum/Lymph Nodes: There is no axillary lymphadenopathy. No mediastinal lymphadenopathy. No evidence for left hilar lymphadenopathy. Right lower lobe mass is again identified with substantial disease in the paramediastinal right lung. The heart size is normal. No pericardial effusion. The esophagus has normal CT imaging features. Lungs/Pleura: Interval development of confluent airspace disease in the medial right upper lobe right middle lobe common right lower  lobe, presumably related to post radiation change. The right lower lobe lesion seen previously has decreased in attenuation potentially reflecting necrosis. Left lung is clear. Musculoskeletal: Mixed lytic and sclerotic lesion in the right humeral head compatible with known metastatic involvement. Metastatic disease is seen in the right clavicle medially in the anterior right third rib. CT ABDOMEN PELVIS FINDINGS Hepatobiliary: No focal abnormality within the liver parenchyma. There is no evidence for gallstones, gallbladder wall thickening, or pericholecystic fluid. Stable mild biliary duct prominence. Pancreas: No focal mass lesion. No dilatation of the main duct. No intraparenchymal cyst. No peripancreatic edema. Spleen: No splenomegaly. No focal mass lesion. Adrenals/Urinary Tract: Right adrenal gland remains normal. Left adrenal mass has decreased substantially in the interval, measuring 2.0 x 0.9 cm today compared to 5.1 x 2.7 cm previously. Kidneys are unremarkable. No evidence for hydroureter. The urinary bladder appears normal for the degree of distention. Stomach/Bowel: Stomach is nondistended. No gastric wall thickening. No evidence of outlet obstruction. Duodenum is normally positioned as is the ligament of Treitz. No small bowel wall thickening. No small bowel dilatation. The terminal ileum is normal. The appendix is normal. No gross colonic mass. No colonic wall thickening. No substantial diverticular change. Vascular/Lymphatic: No abdominal aortic aneurysm. No abdominal atherosclerotic calcification. There is no gastrohepatic or hepatoduodenal ligament lymphadenopathy. No intraperitoneal or retroperitoneal lymphadenopy. No pelvic sidewall lymphadenopathy. Reproductive: The uterus has normal CT imaging appearance. There is no adnexal mass. Other: No intraperitoneal free fluid. Musculoskeletal: Large left acetabular lesion has become more mineralized/sclerotic in the interval, potentially reflecting a  component of healing. 19 mm lytic lesion in the posterior left iliac bone (image 83 series 2) has progressed fairly substantially in the interval, measuring only 9 mm previously. A 19 mm sclerotic lesion in the left L1 vertebral body was not visible previously 15 mm mixed lytic and sclerotic L2 lesion measured about 12 mm on the previous study. 18 mm mixed lytic and sclerotic lesion in the left femoral neck was 17 mm previously. IMPRESSION: 1. Interval increase in central low attenuation in the patient's known right lower lobe neoplasm, likely related to necrosis. The difference in lesion size is difficult to quantify given the adjacent radiation change today and postobstructive collapse on the prior study. The lesion is probably in the 27 x 19 mm size range today compared to 33 x 30 mm previously. 2. Interval new confluent airspace disease in the medial right lung, presumably sequelae of radiation therapy. Superimposed infection cannot be excluded by CT imaging. 3. Substantial interval decrease in size of the left adrenal metastasis. 4. Apparent mixed response of bone lesions. Some lesions have become more sclerotic in the interval without a change in size. The lesion in the posterior left iliac bone is still lytic in character and has clearly progressed in the interval. Electronically Signed   By: Misty Stanley M.D.   On: 11/24/2014 08:26    ASSESSMENT AND PLAN: This is a very pleasant 44 years old Asian female never smoker with recently diagnosed  with stage IV non-small cell lung cancer, adenocarcinoma presented with right middle lobe lung mass in addition to right hilar adenopathy and metastatic disease to the bones, brain and left adrenal gland.  She status post whole brain irradiation with some improvement in her disease on the recent MRI of the brain. The patient is currently on treatment with Tarceva 150 mg by mouth daily and tolerating it fairly well except for grade 2 skin rash. Her recent CT scan of  the chest, abdomen and pelvis showed improvement in her condition except for the consolidation and opacity in the right lung. I discussed the scan results and showed the images to the patient today. I recommended for her to continue her current treatment with Tarceva and to apply clindamycin lotion to the skin rash area. I also recommended for her to resume her treatment with doxycycline but she will take a nausea medicine before her doxycycline dose. I will send prescription for Compazine 10 mg by mouth every 6 hours as needed to her pharmacy. I will also start the patient on Medrol Dosepak for appetite and a skin rash She will come back for follow-up visit in one month's for reevaluation with repeat blood work. She was advised to call immediately if she has any concerning symptoms in the interval. The patient voices understanding of current disease status and treatment options and is in agreement with the current care plan.  All questions were answered. The patient knows to call the clinic with any problems, questions or concerns. We can certainly see the patient much sooner if necessary.  Disclaimer: This note was dictated with voice recognition software. Similar sounding words can inadvertently be transcribed and may not be corrected upon review.

## 2014-12-07 NOTE — Telephone Encounter (Signed)
Gave and printed appt sched and avs for pt for DEc °

## 2014-12-14 ENCOUNTER — Other Ambulatory Visit: Payer: Self-pay | Admitting: Internal Medicine

## 2015-01-05 ENCOUNTER — Encounter: Payer: Self-pay | Admitting: Internal Medicine

## 2015-01-05 ENCOUNTER — Ambulatory Visit (HOSPITAL_BASED_OUTPATIENT_CLINIC_OR_DEPARTMENT_OTHER): Payer: No Typology Code available for payment source | Admitting: Internal Medicine

## 2015-01-05 ENCOUNTER — Other Ambulatory Visit (HOSPITAL_BASED_OUTPATIENT_CLINIC_OR_DEPARTMENT_OTHER): Payer: Medicaid Other

## 2015-01-05 ENCOUNTER — Telehealth: Payer: Self-pay | Admitting: Internal Medicine

## 2015-01-05 VITALS — BP 113/50 | HR 66 | Temp 97.9°F | Resp 18 | Ht 60.0 in | Wt 104.7 lb

## 2015-01-05 DIAGNOSIS — C3491 Malignant neoplasm of unspecified part of right bronchus or lung: Secondary | ICD-10-CM

## 2015-01-05 DIAGNOSIS — C7972 Secondary malignant neoplasm of left adrenal gland: Secondary | ICD-10-CM

## 2015-01-05 DIAGNOSIS — C7931 Secondary malignant neoplasm of brain: Secondary | ICD-10-CM

## 2015-01-05 DIAGNOSIS — Z5111 Encounter for antineoplastic chemotherapy: Secondary | ICD-10-CM

## 2015-01-05 LAB — COMPREHENSIVE METABOLIC PANEL
ALBUMIN: 3.2 g/dL — AB (ref 3.5–5.0)
ALK PHOS: 90 U/L (ref 40–150)
ALT: 11 U/L (ref 0–55)
AST: 14 U/L (ref 5–34)
Anion Gap: 7 mEq/L (ref 3–11)
BILIRUBIN TOTAL: 0.88 mg/dL (ref 0.20–1.20)
BUN: 4.2 mg/dL — ABNORMAL LOW (ref 7.0–26.0)
CO2: 24 meq/L (ref 22–29)
CREATININE: 0.7 mg/dL (ref 0.6–1.1)
Calcium: 8.7 mg/dL (ref 8.4–10.4)
Chloride: 109 mEq/L (ref 98–109)
EGFR: >90 mL/min/{1.73_m2} (ref >90)
GLUCOSE: 82 mg/dL (ref 70–140)
Potassium: 3.6 mEq/L (ref 3.5–5.1)
SODIUM: 140 meq/L (ref 136–145)
TOTAL PROTEIN: 6.2 g/dL — AB (ref 6.4–8.3)

## 2015-01-05 LAB — CBC WITH DIFFERENTIAL/PLATELET
BASO%: 0.3 % (ref 0.0–2.0)
BASOS ABS: 0 10*3/uL (ref 0.0–0.1)
EOS%: 2.2 % (ref 0.0–7.0)
Eosinophils Absolute: 0.2 10*3/uL (ref 0.0–0.5)
HEMATOCRIT: 41 % (ref 34.8–46.6)
HEMOGLOBIN: 13.6 g/dL (ref 11.6–15.9)
LYMPH#: 1.3 10*3/uL (ref 0.9–3.3)
LYMPH%: 18.6 % (ref 14.0–49.7)
MCH: 30.5 pg (ref 25.1–34.0)
MCHC: 33.2 g/dL (ref 31.5–36.0)
MCV: 91.9 fL (ref 79.5–101.0)
MONO#: 0.7 10*3/uL (ref 0.1–0.9)
MONO%: 9.4 % (ref 0.0–14.0)
NEUT%: 69.5 % (ref 38.4–76.8)
NEUTROS ABS: 4.8 10*3/uL (ref 1.5–6.5)
Platelets: 349 10*3/uL (ref 145–400)
RBC: 4.46 10*6/uL (ref 3.70–5.45)
RDW: 13.4 % (ref 11.2–14.5)
WBC: 6.9 10*3/uL (ref 3.9–10.3)

## 2015-01-05 MED ORDER — ERLOTINIB HCL 100 MG PO TABS: 100.0000 mg | ORAL_TABLET | Freq: Every day | ORAL | Status: DC

## 2015-01-05 MED ORDER — PROMETHAZINE-CODEINE 6.25-10 MG/5ML PO SYRP: 5.0000 mL | ORAL_SOLUTION | ORAL | Status: DC | PRN

## 2015-01-05 NOTE — Progress Notes (Signed)
[place] [phone]   [phone]  OFFICE PROGRESS NOTE  [name], MD [place] [address]  DIAGNOSIS: Stage IV (T2a, N1, M1b) non-small cell lung cancer, adenocarcinoma presented with right middle lobe lung mass in addition to right hilar adenopathy as well as metastatic disease to the bone, brain as well as left adrenal metastasis diagnosed in July 2016  PRIOR THERAPY:  1) palliative radiotherapy to the brain as well as metastatic bone lesions in the pelvis. 2) Tarceva 150 mg by mouth daily started on 09/25/2014 discontinued secondary to intolerance with grade 2-3 skin rash.  CURRENT THERAPY: Tarceva 100 mg by mouth daily to start on 01/12/2015.  INTERVAL HISTORY: Deborah Foster 44 y.o. female returns to the clinic today for hospital follow-up visit accompanied by her interpreter. The patient is tolerating her current treatment with Tarceva fairly well except for grade 2-3 skin rash mainly on the face and upper chest. She was recently treated with doxycycline orally and addition to clindamycin lotion. She continues to have at least grade 2 skin rash on the face. She denied having any significant diarrhea. She also has cough and she is currently on Mucinex and Phenergan with codeine. She has no significant weight loss.  She denied having any significant chest pain, shortness of breath, or hemoptysis. The patient denied having any headache or visual changes. She has no significant nausea or vomiting. She has no fever or chills. She is here today for evaluation and repeat blood work.  MEDICAL HISTORY: Past Medical History  Diagnosis Date  . Pneumonia   . Hemoptysis   . Lung mass   . Hypokalemia   . Radiation 08/23/14-09/07/14    Brain/chest and left hip 30 Gy 12 Fx  . lung ca dx'd 07/2014  . Metastasis to brain ([facility])   . Metastasis to adrenal gland ([facility])     ALLERGIES:  is allergic to doxycycline.  MEDICATIONS:    Current Outpatient Prescriptions  Medication Sig Dispense Refill  . acyclovir (ZOVIRAX) 400 MG tablet     . albuterol (PROVENTIL HFA;VENTOLIN HFA) 108 (90 BASE) MCG/ACT inhaler Inhale 2 puffs into the lungs every 6 (six) hours as needed for wheezing or shortness of breath. 1 Inhaler 2  . clindamycin (CLINDAGEL) 1 % gel Apply topically 2 (two) times daily. 30 g 1  . doxycycline (VIBRA-TABS) 100 MG tablet Take 1 tablet (100 mg total) by mouth 2 (two) times daily. 20 tablet 0  . fluconazole (DIFLUCAN) 100 MG tablet     . guaiFENesin (MUCINEX) 600 MG 12 hr tablet Take 600 mg by mouth 2 (two) times daily.    Marland Kitchen HYDROMET 5-1.5 MG/5ML syrup     . magic mouthwash w/lidocaine SOLN Take 5 mLs by mouth 4 (four) times daily as needed.  1  . methylPREDNISolone (MEDROL DOSEPAK) 4 MG TBPK tablet Use as instructed 21 tablet 0  . methylPREDNISolone (MEDROL DOSEPAK) 4 MG TBPK tablet Use as instructed 21 tablet 0  . Misc Natural Products (TURMERIC CURCUMIN) CAPS Take 750 capsules by mouth.    . ondansetron (ZOFRAN) 4 MG tablet Take 1 tablet (4 mg total) by mouth every 8 (eight) hours as needed for nausea or vomiting. 30 tablet 0  . potassium chloride SA (K-DUR,KLOR-CON) 20 MEQ tablet Take 2 tablets (40 mEq total) by mouth daily. 20 tablet 0  . prochlorperazine (COMPAZINE) 10 MG tablet Take 1 tablet (10 mg total) by mouth every 6 (six)  hours as needed for nausea or vomiting. 30 tablet 0  . promethazine-codeine (PHENERGAN WITH CODEINE) 6.25-10 MG/5ML syrup Take 5 mLs by mouth every 4 (four) hours as needed.  0  . TARCEVA 150 MG tablet TAKE ONE TABLET BY MOUTH DAILY. TAKE ON EMPTY STOMACH ONE HOUR BEFORE OR TWO HOURS AFTER A MEAL. 30 tablet 1  . UNABLE TO FIND Take 650 mg by mouth. Med Name:corduceps sinesis     No current facility-administered medications for this visit.    SURGICAL HISTORY:  Past Surgical History  Procedure Laterality Date  . Video bronchoscopy Bilateral 08/09/2014    Procedure: VIDEO  BRONCHOSCOPY WITHOUT FLUORO;  Surgeon: [name], MD;  Location: [facility];  Service: Cardiopulmonary;  Laterality: Bilateral;    REVIEW OF SYSTEMS:  Constitutional: negative Eyes: negative Ears, nose, mouth, throat, and face: negative Respiratory: positive for cough Cardiovascular: negative Gastrointestinal: negative Genitourinary:negative Integument/breast: positive for dryness and rash Hematologic/lymphatic: negative Musculoskeletal:positive for bone pain Neurological: negative Behavioral/Psych: negative Endocrine: negative Allergic/Immunologic: negative   PHYSICAL EXAMINATION: General appearance: alert, cooperative, fatigued and no distress Head: Normocephalic, without obvious abnormality, atraumatic Neck: no adenopathy, no JVD, supple, symmetrical, trachea midline and thyroid not enlarged, symmetric, no tenderness/mass/nodules Lymph nodes: Cervical, supraclavicular, and axillary nodes normal. Resp: clear to auscultation bilaterally Back: symmetric, no curvature. ROM normal. No CVA tenderness. Cardio: regular rate and rhythm, S1, S2 normal, no murmur, click, rub or gallop GI: soft, non-tender; bowel sounds normal; no masses,  no organomegaly Extremities: extremities normal, atraumatic, no cyanosis or edema Neurologic: Alert and oriented X 3, normal strength and tone. Normal symmetric reflexes. Normal coordination and gait  Skin exam showed maculopapular grade 2 skin rash on the face and upper chest.  ECOG PERFORMANCE STATUS: 1 - Symptomatic but completely ambulatory  Blood pressure 113/50, pulse 66, temperature 97.9 F (36.6 C), temperature source Oral, resp. rate 18, height 5' (1.524 m), weight 104 lb 11.2 oz (47.492 kg), SpO2 100 %.  LABORATORY DATA: Lab Results  Component Value Date   WBC 6.9 01/05/2015   HGB 13.6 01/05/2015   HCT 41.0 01/05/2015   MCV 91.9 01/05/2015   PLT 349 01/05/2015      Chemistry      Component Value Date/Time   NA 140  01/05/2015 1135   NA 135 08/17/2014 0553   K 3.6 01/05/2015 1135   K 3.2* 08/17/2014 0553   CL 96* 08/17/2014 0553   CO2 24 01/05/2015 1135   CO2 26 08/17/2014 0553   BUN 4.2* 01/05/2015 1135   BUN <5* 08/17/2014 0553   CREATININE 0.7 01/05/2015 1135   CREATININE 0.41* 08/17/2014 0553      Component Value Date/Time   CALCIUM 8.7 01/05/2015 1135   CALCIUM 8.4* 08/17/2014 0553   ALKPHOS 90 01/05/2015 1135   ALKPHOS 145* 08/08/2014 0458   AST 14 01/05/2015 1135   AST 16 08/08/2014 0458   ALT 11 01/05/2015 1135   ALT 14 08/08/2014 0458   BILITOT 0.88 01/05/2015 1135   BILITOT 0.5 08/08/2014 0458       RADIOGRAPHIC STUDIES: No results found.  ASSESSMENT AND PLAN: This is a very pleasant 44 years old Asian female never smoker with recently diagnosed with stage IV non-small cell lung cancer, adenocarcinoma presented with right middle lobe lung mass in addition to right hilar adenopathy and metastatic disease to the bones, brain and left adrenal gland.  She status post whole brain irradiation with some improvement in her disease on the recent MRI of  the brain. The patient is currently on treatment with Tarceva 150 mg by mouth daily and tolerating it fairly well except for grade 2 skin rash and recent episodes of diarrhea. I discussed with the patient reducing her dose of Tarceva to 100 mg by mouth daily starting from the next prescription and 7 days. I recommended for her to continue her current treatment with Tarceva and to apply clindamycin lotion to the skin rash area. For the metastatic bone lesions, I would consider the patient for treatment with Xgeva after receiving dental clearance. I referred her to Dr. Enrique Sack for evaluation and dental clearance before starting the first dose of Xgeva. The patient was getting refill for her cough medication with Phenergan and codeine. She will come back for follow-up visit in one month for reevaluation with repeat blood work. She was advised  to call immediately if she has any concerning symptoms in the interval. The patient voices understanding of current disease status and treatment options and is in agreement with the current care plan.  All questions were answered. The patient knows to call the clinic with any problems, questions or concerns. We can certainly see the patient much sooner if necessary.  Disclaimer: This note was dictated with voice recognition software. Similar sounding words can inadvertently be transcribed and may not be corrected upon review.

## 2015-01-05 NOTE — Telephone Encounter (Signed)
Gave patient avs report and appointments for January. Left message at dental medicine clinic requesting appointment for patient within 1-2 weeks - prior to next xgeva inj. Referral in EPIC and dental medicine asked to call me if they are not able to see patient. Patient aware she will be contacted re appointment for dental medicine.

## 2015-01-08 ENCOUNTER — Encounter (HOSPITAL_COMMUNITY): Payer: Self-pay

## 2015-01-08 ENCOUNTER — Observation Stay (HOSPITAL_COMMUNITY)
Admission: EM | Admit: 2015-01-08 | Discharge: 2015-01-09 | Disposition: A | Discharge status: Home / Self Care | Payer: No Typology Code available for payment source | Attending: Family Medicine | Admitting: Family Medicine

## 2015-01-08 ENCOUNTER — Other Ambulatory Visit: Payer: Self-pay

## 2015-01-08 ENCOUNTER — Emergency Department (HOSPITAL_COMMUNITY): Payer: No Typology Code available for payment source

## 2015-01-08 DIAGNOSIS — Z85118 Personal history of other malignant neoplasm of bronchus and lung: Secondary | ICD-10-CM | POA: Diagnosis not present

## 2015-01-08 DIAGNOSIS — Z8701 Personal history of pneumonia (recurrent): Secondary | ICD-10-CM | POA: Diagnosis not present

## 2015-01-08 DIAGNOSIS — C3490 Malignant neoplasm of unspecified part of unspecified bronchus or lung: Secondary | ICD-10-CM | POA: Diagnosis not present

## 2015-01-08 DIAGNOSIS — R5383 Other fatigue: Secondary | ICD-10-CM | POA: Diagnosis not present

## 2015-01-08 DIAGNOSIS — R042 Hemoptysis: Principal | ICD-10-CM

## 2015-01-08 DIAGNOSIS — Z85841 Personal history of malignant neoplasm of brain: Secondary | ICD-10-CM | POA: Insufficient documentation

## 2015-01-08 DIAGNOSIS — Z79899 Other long term (current) drug therapy: Secondary | ICD-10-CM | POA: Insufficient documentation

## 2015-01-08 LAB — TYPE AND SCREEN
ABO/RH(D): B POS
ANTIBODY SCREEN: NEGATIVE

## 2015-01-08 LAB — COMPREHENSIVE METABOLIC PANEL
ALK PHOS: 86 U/L (ref 38–126)
ALT: 13 U/L — AB (ref 14–54)
AST: 18 U/L (ref 15–41)
Albumin: 3.5 g/dL (ref 3.5–5.0)
Anion gap: 7 (ref 5–15)
BILIRUBIN TOTAL: 0.8 mg/dL (ref 0.3–1.2)
BUN: 7 mg/dL (ref 6–20)
CALCIUM: 8.7 mg/dL — AB (ref 8.9–10.3)
CO2: 27 mmol/L (ref 22–32)
CREATININE: 0.68 mg/dL (ref 0.44–1.00)
Chloride: 106 mmol/L (ref 101–111)
Glucose, Bld: 101 mg/dL — ABNORMAL HIGH (ref 65–99)
Potassium: 3.6 mmol/L (ref 3.5–5.1)
SODIUM: 140 mmol/L (ref 135–145)
TOTAL PROTEIN: 6.1 g/dL — AB (ref 6.5–8.1)

## 2015-01-08 LAB — ABO/RH: ABO/RH(D): B POS

## 2015-01-08 LAB — CBC WITH DIFFERENTIAL/PLATELET
Basophils Absolute: 0 10*3/uL (ref 0.0–0.1)
Basophils Relative: 0 %
Eosinophils Absolute: 0.2 10*3/uL (ref 0.0–0.7)
Eosinophils Relative: 2 %
HEMATOCRIT: 40 % (ref 36.0–46.0)
HEMOGLOBIN: 13.4 g/dL (ref 12.0–15.0)
LYMPHS ABS: 1 10*3/uL (ref 0.7–4.0)
LYMPHS PCT: 15 %
MCH: 30.5 pg (ref 26.0–34.0)
MCHC: 33.5 g/dL (ref 30.0–36.0)
MCV: 91.1 fL (ref 78.0–100.0)
MONOS PCT: 9 %
Monocytes Absolute: 0.6 10*3/uL (ref 0.1–1.0)
NEUTROS ABS: 5.1 10*3/uL (ref 1.7–7.7)
NEUTROS PCT: 74 %
Platelets: 371 10*3/uL (ref 150–400)
RBC: 4.39 MIL/uL (ref 3.87–5.11)
RDW: 13.3 % (ref 11.5–15.5)
WBC: 6.9 10*3/uL (ref 4.0–10.5)

## 2015-01-08 LAB — I-STAT TROPONIN, ED: Troponin i, poc: 0 ng/mL (ref 0.00–0.08)

## 2015-01-08 MED ORDER — ACETAMINOPHEN 325 MG PO TABS: 650.0000 mg | ORAL_TABLET | Freq: Four times a day (QID) | ORAL | Status: DC | PRN

## 2015-01-08 MED ORDER — ALBUTEROL SULFATE HFA 108 (90 BASE) MCG/ACT IN AERS: 2.0000 | INHALATION_SPRAY | Freq: Four times a day (QID) | RESPIRATORY_TRACT | Status: DC | PRN

## 2015-01-08 MED ORDER — ERLOTINIB HCL 150 MG PO TABS
150.0000 mg | ORAL_TABLET | ORAL | Status: DC
  Administered 2015-01-09: 150 mg via ORAL

## 2015-01-08 MED ORDER — IOHEXOL 350 MG/ML SOLN
100.0000 mL | Freq: Once | INTRAVENOUS | Status: AC | PRN
  Administered 2015-01-08: 100 mL via INTRAVENOUS

## 2015-01-08 MED ORDER — ERLOTINIB HCL 100 MG PO TABS
100.0000 mg | ORAL_TABLET | Freq: Every day | ORAL | Status: DC
  Filled 2015-01-08 (×2): qty 1

## 2015-01-08 MED ORDER — SODIUM CHLORIDE 0.9 % IJ SOLN: 3.0000 mL | Freq: Two times a day (BID) | INTRAMUSCULAR | Status: DC

## 2015-01-08 MED ORDER — SODIUM CHLORIDE 0.9 % IV SOLN
250.0000 mL | INTRAVENOUS | Status: DC | PRN
  Administered 2015-01-08: 250 mL via INTRAVENOUS

## 2015-01-08 MED ORDER — ONDANSETRON HCL 4 MG PO TABS: 4.0000 mg | ORAL_TABLET | Freq: Three times a day (TID) | ORAL | Status: DC | PRN

## 2015-01-08 MED ORDER — ZOLPIDEM TARTRATE 5 MG PO TABS
5.0000 mg | ORAL_TABLET | Freq: Every evening | ORAL | Status: DC | PRN
  Administered 2015-01-08: 5 mg via ORAL
  Filled 2015-01-08: qty 1

## 2015-01-08 MED ORDER — ACETAMINOPHEN 650 MG RE SUPP: 650.0000 mg | Freq: Four times a day (QID) | RECTAL | Status: DC | PRN

## 2015-01-08 MED ORDER — SODIUM CHLORIDE 0.9 % IJ SOLN: 3.0000 mL | INTRAMUSCULAR | Status: DC | PRN

## 2015-01-08 MED ORDER — GUAIFENESIN-DM 100-10 MG/5ML PO SYRP
5.0000 mL | ORAL_SOLUTION | ORAL | Status: DC | PRN
  Administered 2015-01-08 – 2015-01-09 (×2): 5 mL via ORAL
  Filled 2015-01-08 (×2): qty 10

## 2015-01-08 MED ORDER — ALBUTEROL SULFATE (2.5 MG/3ML) 0.083% IN NEBU: 2.5000 mg | INHALATION_SOLUTION | Freq: Four times a day (QID) | RESPIRATORY_TRACT | Status: DC | PRN

## 2015-01-08 MED ORDER — ERLOTINIB HCL 100 MG PO TABS: 100.0000 mg | ORAL_TABLET | ORAL | Status: DC

## 2015-01-08 NOTE — ED Provider Notes (Signed)
CSN: [id number]     Arrival date & time 01/08/15  0913 History   First MD Initiated Contact with Patient 01/08/15 [phone]     Chief Complaint  Patient presents with  . Lung Cancer  . Hemoptysis     (Consider location/radiation/quality/duration/timing/severity/associated sxs/prior Treatment) HPI Patient with history of lung cancer and metastatic spread to the brain and adrenal glands on palliative chemotherapy presents with acute onset hemoptysis this morning. Patient brought in the vomit or blood in a plastic bag. There is roughly 30-50 ml. She states she has a mild substernal chest pain. No new shortness of breath. No nausea, vomiting or abdominal pain. Patient says she is having increased fatigue. No recent fever chills. No epistaxis. No focal weakness or numbness. Past Medical History  Diagnosis Date  . Pneumonia   . Hemoptysis   . Lung mass   . Hypokalemia   . Radiation 08/23/14-09/07/14    Brain/chest and left hip 30 Gy 12 Fx  . lung ca dx'd 07/2014  . Metastasis to brain ([place])   . Metastasis to adrenal gland [facility])    Past Surgical History  Procedure Laterality Date  . Video bronchoscopy Bilateral 08/09/2014    Procedure: VIDEO BRONCHOSCOPY WITHOUT FLUORO;  Surgeon: [name], MD;  Location: [place];  Service: Cardiopulmonary;  Laterality: Bilateral;   History reviewed. No pertinent family history. Social History  Substance Use Topics  . Smoking status: Never Smoker   . Smokeless tobacco: None  . Alcohol Use: No   OB History    No data available     Review of Systems  Constitutional: Positive for fatigue. Negative for fever and chills.  HENT: Negative for nosebleeds and sore throat.   Respiratory: Positive for cough. Negative for shortness of breath.   Cardiovascular: Positive for chest pain. Negative for palpitations and leg swelling.  Gastrointestinal: Negative for nausea, vomiting, abdominal pain and diarrhea.  Musculoskeletal: Negative for back pain, neck  pain and neck stiffness.  Skin: Negative for wound.  Neurological: Negative for dizziness, weakness, light-headedness, numbness and headaches.  All other systems reviewed and are negative.     Allergies  Doxycycline  Home Medications   Prior to Admission medications   Medication Sig Start Date End Date Taking? Authorizing Provider  erlotinib (TARCEVA) 150 MG tablet Take 150 mg by mouth daily. Take on an empty stomach 1 hour before meals or 2 hours after.   Yes Historical Provider, MD  promethazine-codeine (PHENERGAN WITH CODEINE) 6.25-10 MG/5ML syrup Take 5 mLs by mouth every 4 (four) hours as needed. Patient taking differently: Take 5 mLs by mouth every 4 (four) hours as needed for cough.  01/05/15  Yes [name], MD  albuterol (PROVENTIL HFA;VENTOLIN HFA) 108 (90 BASE) MCG/ACT inhaler Inhale 2 puffs into the lungs every 6 (six) hours as needed for wheezing or shortness of breath. Patient not taking: Reported on 01/05/2015 08/17/14   Thurnell Lose, MD  benzonatate (TESSALON PERLES) 100 MG capsule Take 1 capsule (100 mg total) by mouth 3 (three) times daily as needed for cough. 01/09/15   Velvet Bathe, MD  clindamycin (CLINDAGEL) 1 % gel Apply topically 2 (two) times daily. Patient not taking: Reported on 01/05/2015 10/08/14   Laurie Panda, NP  ondansetron (ZOFRAN) 4 MG tablet Take 1 tablet (4 mg total) by mouth every 8 (eight) hours as needed for nausea or vomiting. Patient not taking: Reported on 01/05/2015 08/17/14   Thurnell Lose, MD  prochlorperazine (COMPAZINE) 10 MG tablet  Take 1 tablet (10 mg total) by mouth every 6 (six) hours as needed for nausea or vomiting. Patient not taking: Reported on 01/05/2015 12/07/14   [name], MD   BP 101/55 mmHg  Pulse 95  Temp(Src) 97.9 F (36.6 C) (Oral)  Resp 18  Ht 5' (1.524 m)  Wt 98 lb 8.7 oz (44.7 kg)  BMI 19.25 kg/m2  SpO2 100% Physical Exam  Constitutional: She is oriented to person, place, and time. She appears  well-developed and well-nourished. No distress.  HENT:  Head: Normocephalic and atraumatic.  Mouth/Throat: Oropharynx is clear and moist. No oropharyngeal exudate.  Eyes: EOM are normal. Pupils are equal, round, and reactive to light.  Neck: Normal range of motion. Neck supple. No JVD present.  Cardiovascular: Normal rate and regular rhythm.  Exam reveals no gallop and no friction rub.   No murmur heard. Pulmonary/Chest: Effort normal. No respiratory distress. She has no wheezes. She has rales (scattered crackles bilateral bases). She exhibits no tenderness.  Abdominal: Soft. Bowel sounds are normal. She exhibits no distension and no mass. There is no tenderness. There is no rebound and no guarding.  Musculoskeletal: Normal range of motion. She exhibits no edema or tenderness.  Lymphadenopathy:    She has no cervical adenopathy.  Neurological: She is alert and oriented to person, place, and time.  Patient is alert and oriented x3 with clear, goal oriented speech. Patient has 5/5 motor in all extremities. Sensation is intact to light touch. Patient has a normal gait and walks without assistance.  Skin: Skin is warm and dry. No rash noted. No erythema.  Psychiatric: She has a normal mood and affect. Her behavior is normal.  Nursing note and vitals reviewed.   ED Course  Procedures (including critical care time) Labs Review Labs Reviewed  COMPREHENSIVE METABOLIC PANEL - Abnormal; Notable for the following:    Glucose, Bld 101 (*)    Calcium 8.7 (*)    Total Protein 6.1 (*)    ALT 13 (*)    All other components within normal limits  BASIC METABOLIC PANEL - Abnormal; Notable for the following:    Calcium 8.7 (*)    All other components within normal limits  CBC WITH DIFFERENTIAL/PLATELET  CBC  I-STAT TROPOININ, ED  TYPE AND SCREEN  ABO/RH    Imaging Review Dg Chest 2 View  01/08/2015  CLINICAL DATA:  Patient with lung cancer with new hemoptysis this morning. EXAM: CHEST  2 VIEW  COMPARISON:  CT of the chest 11/24/2014, chest radiograph 08/24/2014. FINDINGS: Cardiomediastinal silhouette is normal. Mediastinal contours appear intact. Patient has a known right lower lobe neoplasm, which projects as dense airspace opacity in the mid right lung. There is however an increased airspace consolidations surrounding this finding in the right middle and right lower lobe, which may represent increase in postobstructive atelectasis versus developing of airspace consolidation. The left lung is clear. There is no evidence of pleural effusion all pneumothorax. Osseous structures are without acute abnormality. Soft tissues are grossly normal. IMPRESSION: Interval increase in airspace opacity surrounding known right lung neoplasm, which may represent increase a postobstructive atelectasis or development of airspace consolidation. Intraparenchymal hemorrhage cannot be excluded, given patient's symptoms of new hemoptysis. Electronically Signed   By: Fidela Salisbury M.D.   On: 01/08/2015 09:58   Ct Angio Chest Pe W/cm &/or Wo Cm  01/08/2015  CLINICAL DATA:  Hemoptysis. Lung cancer diagnosed in July 2016. Patient undergoing radiation therapy. EXAM: CT ANGIOGRAPHY CHEST WITH CONTRAST TECHNIQUE:  Multidetector CT imaging of the chest was performed using the standard protocol during bolus administration of intravenous contrast. Multiplanar CT image reconstructions and MIPs were obtained to evaluate the vascular anatomy. CONTRAST:  138m OMNIPAQUE IOHEXOL 350 MG/ML SOLN COMPARISON:  CT 11/24/2014 FINDINGS: Mediastinum/Nodes: No filling defects the pulmonary suggest acute pulmonary embolism. No acute findings aorta great vessels. No pericardial fluid Lungs/Pleura: There is perihilar consolidation in the RIGHT middle lobe, RIGHT upper lobe and RIGHT lower lobe which has contracted somewhat from comparison exam but not significantly changed. There is bronchiectasis and air bronchograms through this perihilar  consolidated pattern. There is no pleural fluid in the RIGHT hemi thorax. No suspicious nodularity. The LEFT lung is clear. The central airways appear normal. No fluid within airways identified. Upper abdomen: Limited view of the liver, kidneys, pancreas are unremarkable. Normal adrenal glands. Musculoskeletal: Stable sclerotic metastasis within the sternum. Review of the MIP images confirms the above findings. IMPRESSION: 1. No evidence of acute pulmonary embolism. 2. Perihilar consolidation in the RIGHT hemi thorax is very similar to comparison exam of CT 11/24/2014. There may be slight contraction of the consolidation. Extensive air bronchograms. No fluid within the bronchial tree identified. 3. Sclerotic metastasis within the sternum unchanged. Electronically Signed   By: SSuzy BouchardM.D.   On: 01/08/2015 11:44   I have personally reviewed and evaluated these images and lab results as part of my medical decision-making.   EKG Interpretation   Date/Time:  Saturday January 08 2015 09:35:47 EST Ventricular Rate:  103 PR Interval:  131 QRS Duration: 81 QT Interval:  337 QTC Calculation: 441 R Axis:   83 Text Interpretation:  Sinus tachycardia Confirmed by YLita Mains MD, Deneene Tarver  ([id number] on 01/08/2015 9:47:42 AM      MDM   Final diagnoses:  Hemoptysis    Patient with acute onset hemoptysis. She is well-appearing. No shortness of breath or active hemoptysis. Discussed with Dr. MJulien Nordmannand recommended admission for observation. Try will admit to telemetry bed. Discussed with pulmonologist to agrees with hospitalist admission.    DJulianne Rice MD 01/09/15 1540

## 2015-01-08 NOTE — ED Notes (Signed)
Patient transported to CT 

## 2015-01-08 NOTE — ED Notes (Signed)
She carries with her a ziplock bag containing a quantity of blood which I estimate to be ~80-134m which she states she "coughed up this morning".  She states she has ling cancer and that Dr. MRogue Juryis her doctor.  She ambulate scapably with slight limp and is in no distress.

## 2015-01-08 NOTE — Progress Notes (Signed)
Patient wants something for a cough. RN paged on call NP.

## 2015-01-08 NOTE — ED Notes (Signed)
Attempted to call report. Receiving nurse to call back for report.

## 2015-01-08 NOTE — H&P (Signed)
[facility] History and Physical  Deborah Foster BJS:[id number] DOB: 1970-05-04 DOA: 01/08/2015  Referring physician: Lita Mains PCP: [name], MD   Chief Complaint: hemoptysis  HPI: Deborah Foster is a 44 y.o. female  With history of non small cell lung cancer followed by Dr. Julien Nordmann as outpatient. Currently being seen for hemoptysis which occurred today. Problem has since ceased but patient presented for evaluation at the hospital. She is not aware of what makes it better or worse. The problem occurred incidiously. Currently has resolved.   ED physician discussed with pulmonologist who declined admission.   Review of Systems:  Constitutional:  No weight loss, night sweats, Fevers, chills, fatigue.  HEENT:  No headaches, Difficulty swallowing,Tooth/dental problems,Sore throat,  No sneezing, itching, ear ache, nasal congestion, post nasal drip,  Cardio-vascular:  No chest pain, Orthopnea, PND, swelling in lower extremities, anasarca, dizziness, palpitations  GI:  No heartburn, indigestion, abdominal pain, nausea, vomiting, diarrhea, change in bowel habits, loss of appetite  Resp:  No shortness of breath with exertion or at rest. No excess mucus, no productive cough, No non-productive cough, + coughing up of blood.No change in color of mucus.No wheezing.No chest wall deformity  Skin:  no rash or lesions.  GU:  no dysuria, change in color of urine, no urgency or frequency. No flank pain.  Musculoskeletal:  No joint pain or swelling. No decreased range of motion. No back pain.  Psych:  No change in mood or affect. No depression or anxiety. No memory loss.   Past Medical History  Diagnosis Date  . Pneumonia   . Hemoptysis   . Lung mass   . Hypokalemia   . Radiation 08/23/14-09/07/14    Brain/chest and left hip 30 Gy 12 Fx  . lung ca dx'd 07/2014  . Metastasis to brain ([place])   . Metastasis to adrenal gland [facility])    Past Surgical History  Procedure Laterality Date    . Video bronchoscopy Bilateral 08/09/2014    Procedure: VIDEO BRONCHOSCOPY WITHOUT FLUORO;  Surgeon: [name], MD;  Location: [place];  Service: Cardiopulmonary;  Laterality: Bilateral;   Social History:  reports that she has never smoked. She does not have any smokeless tobacco history on file. She reports that she does not drink alcohol or use illicit drugs.  Allergies  Allergen Reactions  . Doxycycline Nausea And Vomiting    Family history - none reported  Prior to Admission medications   Medication Sig Start Date End Date Taking? Authorizing Provider  erlotinib (TARCEVA) 100 MG tablet Take 1 tablet (100 mg total) by mouth daily. Take on an empty stomach 1 hour before meals or 2 hours after. 01/05/15  Yes [name], MD  promethazine-codeine [facility] WITH CODEINE) 6.25-10 MG/5ML syrup Take 5 mLs by mouth every 4 (four) hours as needed. Patient taking differently: Take 5 mLs by mouth every 4 (four) hours as needed for cough.  01/05/15  Yes [name], MD  albuterol (PROVENTIL HFA;VENTOLIN HFA) 108 (90 BASE) MCG/ACT inhaler Inhale 2 puffs into the lungs every 6 (six) hours as needed for wheezing or shortness of breath. Patient not taking: Reported on 01/05/2015 08/17/14   Thurnell Lose, MD  clindamycin (CLINDAGEL) 1 % gel Apply topically 2 (two) times daily. Patient not taking: Reported on 01/05/2015 10/08/14   Laurie Panda, NP  ondansetron (ZOFRAN) 4 MG tablet Take 1 tablet (4 mg total) by mouth every 8 (eight) hours as needed for nausea or vomiting. Patient not taking: Reported  on 01/05/2015 08/17/14   Thurnell Lose, MD  prochlorperazine (COMPAZINE) 10 MG tablet Take 1 tablet (10 mg total) by mouth every 6 (six) hours as needed for nausea or vomiting. Patient not taking: Reported on 01/05/2015 12/07/14   [name], MD   Physical Exam: Filed Vitals:   01/08/15 1105 01/08/15 1200 01/08/15 1230 01/08/15 1300  BP: 108/69 115/72 109/73 98/63  Pulse:  96 95 95 97  Temp:    98.1 F (36.7 C)  TempSrc:    Oral  Resp: '20 17 13 18  '$ Height:    5' (1.524 m)  Weight:    44.7 kg (98 lb 8.7 oz)  SpO2: 99% 100% 100% 99%    Wt Readings from Last 3 Encounters:  01/08/15 44.7 kg (98 lb 8.7 oz)  01/05/15 47.492 kg (104 lb 11.2 oz)  12/07/14 46.584 kg (102 lb 11.2 oz)    General:  Appears calm and comfortable Eyes: PERRL, normal lids, irises & conjunctiva ENT: grossly normal hearing, lips & tongue Neck: no LAD, masses or thyromegaly Cardiovascular: RRR, no m/r/g. No [place] edema. Respiratory: CTA bilaterally, no w/r/r. Normal respiratory effort. Abdomen: soft, nt, nd Skin: no rash or induration seen on limited exam Musculoskeletal: grossly normal tone BUE/BLE Psychiatric: grossly normal mood and affect, speech fluent and appropriate Neurologic: grossly non-focal.          Labs on Admission:  Basic Metabolic Panel:  Recent Labs Lab 01/05/15 1135 01/08/15 0949  NA 140 140  K 3.6 3.6  CL  --  106  CO2 24 27  GLUCOSE 82 101*  BUN 4.2* 7  CREATININE 0.7 0.68  CALCIUM 8.7 8.7*   Liver Function Tests:  Recent Labs Lab 01/05/15 1135 01/08/15 0949  AST 14 18  ALT 11 13*  ALKPHOS 90 86  BILITOT 0.88 0.8  PROT 6.2* 6.1*  ALBUMIN 3.2* 3.5   No results for input(s): LIPASE, AMYLASE in the last 168 hours. No results for input(s): AMMONIA in the last 168 hours. CBC:  Recent Labs Lab 01/05/15 1135 01/08/15 0949  WBC 6.9 6.9  NEUTROABS 4.8 5.1  HGB 13.6 13.4  HCT 41.0 40.0  MCV 91.9 91.1  PLT 349 371   Cardiac Enzymes: No results for input(s): CKTOTAL, CKMB, CKMBINDEX, TROPONINI in the last 168 hours.  BNP (last 3 results) No results for input(s): BNP in the last 8760 hours.  ProBNP (last 3 results) No results for input(s): PROBNP in the last 8760 hours.  CBG: No results for input(s): GLUCAP in the last 168 hours.  Radiological Exams on Admission: Dg Chest 2 View  01/08/2015  CLINICAL DATA:  Patient with lung  cancer with new hemoptysis this morning. EXAM: CHEST  2 VIEW COMPARISON:  CT of the chest 11/24/2014, chest radiograph 08/24/2014. FINDINGS: Cardiomediastinal silhouette is normal. Mediastinal contours appear intact. Patient has a known right lower lobe neoplasm, which projects as dense airspace opacity in the mid right lung. There is however an increased airspace consolidations surrounding this finding in the right middle and right lower lobe, which may represent increase in postobstructive atelectasis versus developing of airspace consolidation. The left lung is clear. There is no evidence of pleural effusion all pneumothorax. Osseous structures are without acute abnormality. Soft tissues are grossly normal. IMPRESSION: Interval increase in airspace opacity surrounding known right lung neoplasm, which may represent increase a postobstructive atelectasis or development of airspace consolidation. Intraparenchymal hemorrhage cannot be excluded, given patient's symptoms of new hemoptysis. Electronically Signed   By:  Fidela Salisbury M.D.   On: 01/08/2015 09:58   Ct Angio Chest Pe W/cm &/or Wo Cm  01/08/2015  CLINICAL DATA:  Hemoptysis. Lung cancer diagnosed in July 2016. Patient undergoing radiation therapy. EXAM: CT ANGIOGRAPHY CHEST WITH CONTRAST TECHNIQUE: Multidetector CT imaging of the chest was performed using the standard protocol during bolus administration of intravenous contrast. Multiplanar CT image reconstructions and MIPs were obtained to evaluate the vascular anatomy. CONTRAST:  116m OMNIPAQUE IOHEXOL 350 MG/ML SOLN COMPARISON:  CT 11/24/2014 FINDINGS: Mediastinum/Nodes: No filling defects the pulmonary suggest acute pulmonary embolism. No acute findings aorta great vessels. No pericardial fluid Lungs/Pleura: There is perihilar consolidation in the RIGHT middle lobe, RIGHT upper lobe and RIGHT lower lobe which has contracted somewhat from comparison exam but not significantly changed. There is  bronchiectasis and air bronchograms through this perihilar consolidated pattern. There is no pleural fluid in the RIGHT hemi thorax. No suspicious nodularity. The LEFT lung is clear. The central airways appear normal. No fluid within airways identified. Upper abdomen: Limited view of the liver, kidneys, pancreas are unremarkable. Normal adrenal glands. Musculoskeletal: Stable sclerotic metastasis within the sternum. Review of the MIP images confirms the above findings. IMPRESSION: 1. No evidence of acute pulmonary embolism. 2. Perihilar consolidation in the RIGHT hemi thorax is very similar to comparison exam of CT 11/24/2014. There may be slight contraction of the consolidation. Extensive air bronchograms. No fluid within the bronchial tree identified. 3. Sclerotic metastasis within the sternum unchanged. Electronically Signed   By: SSuzy BouchardM.D.   On: 01/08/2015 11:44    EKG: Independently reviewed. Sinus tachycardia with no ST elevation or depressions  Assessment/Plan Active Problems:   Hemoptysis - Monitor - assess cbc next am. Not actively bleeding  Stage IV (T2a, N1, M1b) non small cell lung cancer - Pt to f/u with oncologist as outpatient.   Code Status: DNR DVT Prophylaxis:scd's Family Communication: d/c patient and family member Disposition Plan: Should patient have continued hemoptysis we are to consult critical/pulmonary team. They have declined admission and have requested to be called pending hospital course.  Time spent: > 35 minutes  VVelvet Bathe[facility] Pager [phone]

## 2015-01-09 DIAGNOSIS — R042 Hemoptysis: Secondary | ICD-10-CM | POA: Diagnosis not present

## 2015-01-09 LAB — CBC
HEMATOCRIT: 40.4 % (ref 36.0–46.0)
Hemoglobin: 13.2 g/dL (ref 12.0–15.0)
MCH: 29.7 pg (ref 26.0–34.0)
MCHC: 32.7 g/dL (ref 30.0–36.0)
MCV: 90.8 fL (ref 78.0–100.0)
PLATELETS: 393 10*3/uL (ref 150–400)
RBC: 4.45 MIL/uL (ref 3.87–5.11)
RDW: 13.4 % (ref 11.5–15.5)
WBC: 7.4 10*3/uL (ref 4.0–10.5)

## 2015-01-09 LAB — BASIC METABOLIC PANEL
ANION GAP: 9 (ref 5–15)
BUN: 8 mg/dL (ref 6–20)
CO2: 24 mmol/L (ref 22–32)
Calcium: 8.7 mg/dL — ABNORMAL LOW (ref 8.9–10.3)
Chloride: 104 mmol/L (ref 101–111)
Creatinine, Ser: 0.51 mg/dL (ref 0.44–1.00)
GFR calc Af Amer: >60 mL/min (ref >60)
GLUCOSE: 89 mg/dL (ref 65–99)
POTASSIUM: 3.6 mmol/L (ref 3.5–5.1)
Sodium: 137 mmol/L (ref 135–145)

## 2015-01-09 MED ORDER — BENZONATATE 100 MG PO CAPS: 100.0000 mg | ORAL_CAPSULE | Freq: Three times a day (TID) | ORAL | Status: DC | PRN

## 2015-01-09 NOTE — Progress Notes (Signed)
Discharge instructions reviewed with patient, written instructions given in [place] and [place].  Questions answered.  Tarceva returned to patient.  Transported to front of hospital via wheelchair accompanied by husband to be taken home.

## 2015-01-09 NOTE — Discharge Summary (Signed)
Physician Discharge Summary  Deborah Foster WER:[id number] DOB: 05/24/1970 DOA: 01/08/2015  PCP: [name], MD  Admit date: 01/08/2015 Discharge date: 01/09/2015  Time spent: > 35  minutes  Recommendations for Outpatient Follow-up:  1. Reassess hemoglobin levels 2. No active bleeding reported   Discharge Diagnoses:  Active Problems:   Hemoptysis   Discharge Condition: stable  Diet recommendation: regular diet  Filed Weights   01/08/15 1300  Weight: 44.7 kg (98 lb 8.7 oz)    History of present illness:  From original HPI: 44 y.o. female  With history of non small cell lung cancer followed by Dr. Julien Nordmann as outpatient. Currently being seen for hemoptysis which occurred today. Problem has since ceased but patient presented for evaluation at the hospital. She is not aware of what makes it better or worse. The problem occurred incidiously. Currently has resolved  Hospital Course:  Hemoptysis - resolved without intervention - patient reports improvement with antitussives. Will prescribe tessalon pearles on d/c  Procedures:  None  Consultations:  None  Discharge Exam: Filed Vitals:   01/08/15 2039 01/09/15 0553  BP: 92/64 101/55  Pulse: 101 95  Temp: 98 F (36.7 C) 97.9 F (36.6 C)  Resp: 18 18    General: pt in nad, alert and awake Cardiovascular: rrr, no mrg Respiratory: cta bl, no wheezes  Discharge Instructions   Discharge Instructions    Call MD for:  difficulty breathing, headache or visual disturbances    Complete by:  As directed      Call MD for:  temperature >100.4    Complete by:  As directed      Diet - low sodium heart healthy    Complete by:  As directed      Discharge instructions    Complete by:  As directed   Please be sure to follow up with your oncologist in 1-2 weeks or sooner should any new concerns arise.     Increase activity slowly    Complete by:  As directed           Current Discharge Medication List    START  taking these medications   Details  benzonatate (TESSALON PERLES) 100 MG capsule Take 1 capsule (100 mg total) by mouth 3 (three) times daily as needed for cough. Qty: 30 capsule, Refills: 0      CONTINUE these medications which have NOT CHANGED   Details  erlotinib (TARCEVA) 150 MG tablet Take 150 mg by mouth daily. Take on an empty stomach 1 hour before meals or 2 hours after.    promethazine-codeine (PHENERGAN WITH CODEINE) 6.25-10 MG/5ML syrup Take 5 mLs by mouth every 4 (four) hours as needed. Qty: 120 mL, Refills: 0   Associated Diagnoses: Non-small cell carcinoma of lung, stage 4, right ([place]); Brain metastases ([place]); Encounter for antineoplastic chemotherapy    albuterol (PROVENTIL HFA;VENTOLIN HFA) 108 (90 BASE) MCG/ACT inhaler Inhale 2 puffs into the lungs every 6 (six) hours as needed for wheezing or shortness of breath. Qty: 1 Inhaler, Refills: 2    clindamycin (CLINDAGEL) 1 % gel Apply topically 2 (two) times daily. Qty: 30 g, Refills: 1    ondansetron (ZOFRAN) 4 MG tablet Take 1 tablet (4 mg total) by mouth every 8 (eight) hours as needed for nausea or vomiting. Qty: 30 tablet, Refills: 0    prochlorperazine (COMPAZINE) 10 MG tablet Take 1 tablet (10 mg total) by mouth every 6 (six) hours as needed for nausea or vomiting. Qty: 30 tablet, Refills:  0       Allergies  Allergen Reactions  . Doxycycline Nausea And Vomiting      The results of significant diagnostics from this hospitalization (including imaging, microbiology, ancillary and laboratory) are listed below for reference.    Significant Diagnostic Studies: Dg Chest 2 View  01/08/2015  CLINICAL DATA:  Patient with lung cancer with new hemoptysis this morning. EXAM: CHEST  2 VIEW COMPARISON:  CT of the chest 11/24/2014, chest radiograph 08/24/2014. FINDINGS: Cardiomediastinal silhouette is normal. Mediastinal contours appear intact. Patient has a known right lower lobe neoplasm, which projects as dense airspace  opacity in the mid right lung. There is however an increased airspace consolidations surrounding this finding in the right middle and right lower lobe, which may represent increase in postobstructive atelectasis versus developing of airspace consolidation. The left lung is clear. There is no evidence of pleural effusion all pneumothorax. Osseous structures are without acute abnormality. Soft tissues are grossly normal. IMPRESSION: Interval increase in airspace opacity surrounding known right lung neoplasm, which may represent increase a postobstructive atelectasis or development of airspace consolidation. Intraparenchymal hemorrhage cannot be excluded, given patient's symptoms of new hemoptysis. Electronically Signed   By: Fidela Salisbury M.D.   On: 01/08/2015 09:58   Ct Angio Chest Pe W/cm &/or Wo Cm  01/08/2015  CLINICAL DATA:  Hemoptysis. Lung cancer diagnosed in July 2016. Patient undergoing radiation therapy. EXAM: CT ANGIOGRAPHY CHEST WITH CONTRAST TECHNIQUE: Multidetector CT imaging of the chest was performed using the standard protocol during bolus administration of intravenous contrast. Multiplanar CT image reconstructions and MIPs were obtained to evaluate the vascular anatomy. CONTRAST:  170m OMNIPAQUE IOHEXOL 350 MG/ML SOLN COMPARISON:  CT 11/24/2014 FINDINGS: Mediastinum/Nodes: No filling defects the pulmonary suggest acute pulmonary embolism. No acute findings aorta great vessels. No pericardial fluid Lungs/Pleura: There is perihilar consolidation in the RIGHT middle lobe, RIGHT upper lobe and RIGHT lower lobe which has contracted somewhat from comparison exam but not significantly changed. There is bronchiectasis and air bronchograms through this perihilar consolidated pattern. There is no pleural fluid in the RIGHT hemi thorax. No suspicious nodularity. The LEFT lung is clear. The central airways appear normal. No fluid within airways identified. Upper abdomen: Limited view of the liver,  kidneys, pancreas are unremarkable. Normal adrenal glands. Musculoskeletal: Stable sclerotic metastasis within the sternum. Review of the MIP images confirms the above findings. IMPRESSION: 1. No evidence of acute pulmonary embolism. 2. Perihilar consolidation in the RIGHT hemi thorax is very similar to comparison exam of CT 11/24/2014. There may be slight contraction of the consolidation. Extensive air bronchograms. No fluid within the bronchial tree identified. 3. Sclerotic metastasis within the sternum unchanged. Electronically Signed   By: SSuzy BouchardM.D.   On: 01/08/2015 11:44    Microbiology: No results found for this or any previous visit (from the past 240 hour(s)).   Labs: Basic Metabolic Panel:  Recent Labs Lab 01/05/15 1135 01/08/15 0949 01/09/15 0416  NA 140 140 137  K 3.6 3.6 3.6  CL  --  106 104  CO2 '24 27 24  '$ GLUCOSE 82 101* 89  BUN 4.2* 7 8  CREATININE 0.7 0.68 0.51  CALCIUM 8.7 8.7* 8.7*   Liver Function Tests:  Recent Labs Lab 01/05/15 1135 01/08/15 0949  AST 14 18  ALT 11 13*  ALKPHOS 90 86  BILITOT 0.88 0.8  PROT 6.2* 6.1*  ALBUMIN 3.2* 3.5   No results for input(s): LIPASE, AMYLASE in the last 168 hours. No results  for input(s): AMMONIA in the last 168 hours. CBC:  Recent Labs Lab 01/05/15 1135 01/08/15 0949 01/09/15 0416  WBC 6.9 6.9 7.4  NEUTROABS 4.8 5.1  --   HGB 13.6 13.4 13.2  HCT 41.0 40.0 40.4  MCV 91.9 91.1 90.8  PLT 349 371 393   Cardiac Enzymes: No results for input(s): CKTOTAL, CKMB, CKMBINDEX, TROPONINI in the last 168 hours. BNP: BNP (last 3 results) No results for input(s): BNP in the last 8760 hours.  ProBNP (last 3 results) No results for input(s): PROBNP in the last 8760 hours.  CBG: No results for input(s): GLUCAP in the last 168 hours.     Signed:  Velvet Bathe MD  FACP  [facility] 01/09/2015, 10:15 AM

## 2015-01-13 ENCOUNTER — Other Ambulatory Visit: Payer: Self-pay | Admitting: *Deleted

## 2015-01-13 NOTE — Telephone Encounter (Signed)
Reached out to [facility] who advised pt will have to speak with medicaid directly and advise she does not have [employer] as they are listed as primary insurance. Medicaid will not cover Rx until this is completed and [employer] is removed out of their system. Attempt to discuss with pt who advised she has tried to tell medicaid she does not have any other insurance but they do not understand her. Spoke with Ander Purpura, [place] who will call Medicaid office and try to help with situation.

## 2015-01-13 NOTE — Telephone Encounter (Signed)
TC from patient requesting refill of her Tarceva. She is currently out of it.

## 2015-01-14 ENCOUNTER — Encounter: Payer: Self-pay | Admitting: Internal Medicine

## 2015-01-14 NOTE — Progress Notes (Signed)
I faxed [employer] req for tarceva prior auth.

## 2015-01-18 ENCOUNTER — Encounter: Payer: Self-pay | Admitting: Internal Medicine

## 2015-01-18 ENCOUNTER — Telehealth: Payer: Self-pay | Admitting: Medical Oncology

## 2015-01-18 ENCOUNTER — Other Ambulatory Visit: Payer: Self-pay | Admitting: Medical Oncology

## 2015-01-18 DIAGNOSIS — C3491 Malignant neoplasm of unspecified part of right bronchus or lung: Secondary | ICD-10-CM

## 2015-01-18 MED ORDER — ERLOTINIB HCL 100 MG PO TABS: 100.0000 mg | ORAL_TABLET | Freq: Every day | ORAL | Status: DC

## 2015-01-18 NOTE — Progress Notes (Signed)
Per [employer] tarceva approved 01/14/15-04/14/15, I send to medical records.    [id number]

## 2015-01-18 NOTE — Telephone Encounter (Signed)
I left a message for Deborah Foster to tell pt that I have her tarceva and to see me today.

## 2015-01-19 ENCOUNTER — Telehealth: Payer: Self-pay | Admitting: Medical Oncology

## 2015-01-19 NOTE — Telephone Encounter (Signed)
Pt received tarceva 150 mg tablets ( 1 month supply ) today . Her insurance is pending.

## 2015-01-20 ENCOUNTER — Encounter (INDEPENDENT_AMBULATORY_CARE_PROVIDER_SITE_OTHER): Payer: Self-pay

## 2015-01-20 ENCOUNTER — Encounter (HOSPITAL_COMMUNITY): Payer: Self-pay | Admitting: Dentistry

## 2015-01-20 ENCOUNTER — Ambulatory Visit (HOSPITAL_COMMUNITY): Payer: Self-pay | Admitting: Dentistry

## 2015-01-20 VITALS — BP 94/50 | HR 85 | Temp 98.1°F

## 2015-01-20 DIAGNOSIS — Z01818 Encounter for other preprocedural examination: Secondary | ICD-10-CM

## 2015-01-20 DIAGNOSIS — K029 Dental caries, unspecified: Secondary | ICD-10-CM

## 2015-01-20 DIAGNOSIS — K011 Impacted teeth: Secondary | ICD-10-CM

## 2015-01-20 DIAGNOSIS — K08409 Partial loss of teeth, unspecified cause, unspecified class: Secondary | ICD-10-CM

## 2015-01-20 DIAGNOSIS — M270 Developmental disorders of jaws: Secondary | ICD-10-CM

## 2015-01-20 DIAGNOSIS — K060 Gingival recession: Secondary | ICD-10-CM

## 2015-01-20 DIAGNOSIS — C3490 Malignant neoplasm of unspecified part of unspecified bronchus or lung: Secondary | ICD-10-CM

## 2015-01-20 DIAGNOSIS — IMO0002 Reserved for concepts with insufficient information to code with codable children: Secondary | ICD-10-CM

## 2015-01-20 DIAGNOSIS — C7931 Secondary malignant neoplasm of brain: Secondary | ICD-10-CM

## 2015-01-20 DIAGNOSIS — J392 Other diseases of pharynx: Secondary | ICD-10-CM

## 2015-01-20 DIAGNOSIS — K0530 Chronic periodontitis, unspecified: Secondary | ICD-10-CM

## 2015-01-20 DIAGNOSIS — C7951 Secondary malignant neoplasm of bone: Secondary | ICD-10-CM | POA: Insufficient documentation

## 2015-01-20 DIAGNOSIS — C7972 Secondary malignant neoplasm of left adrenal gland: Secondary | ICD-10-CM | POA: Insufficient documentation

## 2015-01-20 DIAGNOSIS — K036 Deposits [accretions] on teeth: Secondary | ICD-10-CM

## 2015-01-20 NOTE — Progress Notes (Signed)
DENTAL CONSULTATION  Date of Consultation:  01/20/2015 Patient Name:   Deborah Foster Date of Birth:   [birth year] Medical Record Number: [id number]  VITALS: BP 94/50 mmHg  Pulse 85  Temp(Src) 98.1 F (36.7 C) (Oral)  CHIEF COMPLAINT: Patient referred by Dr. Julien Nordmann for a pre-Xgeva therapy dental protocol examination.  HPI: Deborah Foster is a 45 year old [place] female referred by Dr. Julien Nordmann for a dental consultation. Patient with lung cancer with metastases to the brain, adrenal gland, and bone. Patient with current active Tarceva chemotherapy. Patient with anticipated Xgeva therapy. Patient is now seen as part of a medically necessary pre-Xgeva therapy dental protocol examination.  Patient currently denies acute toothaches, swellings, or abscesses. Patient has never seen a dentist. Patient denies having the lower right third molar extracted. Patient is primarily complaining of "buildup" of calculus involving the mandibular anterior teeth on the lingual aspect.   PROBLEM LIST: Patient Active Problem List   Diagnosis Date Noted  . Non-small cell carcinoma of lung, stage 4 ([facility]) 08/26/2014    Priority: Medium  . Bone metastases ([facility]) 01/20/2015  . Malignant neoplasm metastatic to left adrenal gland ([facility]) 01/20/2015  . Bronchitis 11/22/2014  . Encounter for antineoplastic chemotherapy 11/02/2014  . Mucositis 10/22/2014  . Rash 10/08/2014  . Bone disease, metabolic 15/40/0867  . Brain metastases ([place])   . Palliative care encounter   . Adrenal mass, left ([facility])   . CAP (community acquired pneumonia) 08/07/2014  . Pneumonia 08/07/2014  . Lung mass 08/07/2014  . Hemoptysis 08/07/2014    PMH: Past Medical History  Diagnosis Date  . Pneumonia   . Hemoptysis   . Lung mass   . Hypokalemia   . Radiation 08/23/14-09/07/14    Brain/chest and left hip 30 Gy 12 Fx  . lung ca dx'd 07/2014  . Metastasis to brain ([facility])   . Metastasis to adrenal gland ([facility])   . Bone metastasis ([facility])      PSH: Past Surgical History  Procedure Laterality Date  . Video bronchoscopy Bilateral 08/09/2014    Procedure: VIDEO BRONCHOSCOPY WITHOUT FLUORO;  Surgeon: [name], MD;  Location: [facility];  Service: Cardiopulmonary;  Laterality: Bilateral;    ALLERGIES: Allergies  Allergen Reactions  . Doxycycline Nausea And Vomiting    MEDICATIONS: Current Outpatient Prescriptions  Medication Sig Dispense Refill  . albuterol (PROVENTIL HFA;VENTOLIN HFA) 108 (90 BASE) MCG/ACT inhaler Inhale 2 puffs into the lungs every 6 (six) hours as needed for wheezing or shortness of breath. (Patient not taking: Reported on 01/05/2015) 1 Inhaler 2  . benzonatate (TESSALON PERLES) 100 MG capsule Take 1 capsule (100 mg total) by mouth 3 (three) times daily as needed for cough. 30 capsule 0  . clindamycin (CLINDAGEL) 1 % gel Apply topically 2 (two) times daily. (Patient not taking: Reported on 01/05/2015) 30 g 1  . erlotinib (TARCEVA) 100 MG tablet Take 1 tablet (100 mg total) by mouth daily. Take on an empty stomach 1 hour before meals or 2 hours after 30 tablet 2  . ondansetron (ZOFRAN) 4 MG tablet Take 1 tablet (4 mg total) by mouth every 8 (eight) hours as needed for nausea or vomiting. (Patient not taking: Reported on 01/05/2015) 30 tablet 0  . prochlorperazine (COMPAZINE) 10 MG tablet Take 1 tablet (10 mg total) by mouth every 6 (six) hours as needed for nausea or vomiting. (Patient not taking: Reported on 01/05/2015) 30 tablet 0  . promethazine-codeine (PHENERGAN WITH CODEINE) 6.25-10 MG/5ML syrup Take 5 mLs by  mouth every 4 (four) hours as needed. (Patient taking differently: Take 5 mLs by mouth every 4 (four) hours as needed for cough. ) 120 mL 0   No current facility-administered medications for this visit.    LABS: Lab Results  Component Value Date   WBC 7.4 01/09/2015   HGB 13.2 01/09/2015   HCT 40.4 01/09/2015   MCV 90.8 01/09/2015   PLT 393 01/09/2015      Component Value  Date/Time   NA 137 01/09/2015 0416   NA 140 01/05/2015 1135   K 3.6 01/09/2015 0416   K 3.6 01/05/2015 1135   CL 104 01/09/2015 0416   CO2 24 01/09/2015 0416   CO2 24 01/05/2015 1135   GLUCOSE 89 01/09/2015 0416   GLUCOSE 82 01/05/2015 1135   BUN 8 01/09/2015 0416   BUN 4.2* 01/05/2015 1135   CREATININE 0.51 01/09/2015 0416   CREATININE 0.7 01/05/2015 1135   CALCIUM 8.7* 01/09/2015 0416   CALCIUM 8.7 01/05/2015 1135   GFRNONAA >60 01/09/2015 0416   GFRAA >60 01/09/2015 0416   Lab Results  Component Value Date   INR 1.17 08/10/2014   No results found for: PTT  SOCIAL HISTORY: Social History   Social History  . Marital Status: Married    Spouse Name: N/A  . Number of Children: 3  . Years of Education: N/A   Occupational History  . Not on file.   Social History Main Topics  . Smoking status: Never Smoker   . Smokeless tobacco: Never Used  . Alcohol Use: No  . Drug Use: No  . Sexual Activity: Not on file   Other Topics Concern  . Not on file   Social History Narrative    FAMILY HISTORY: History reviewed. No pertinent family history.  REVIEW OF SYSTEMS: Reviewed ROS from Dr. Wendee Beavers H&P from 01/08/15 with changes as noted. Constitutional: No weight loss, night sweats, Fevers, chills, fatigue.  HEENT: No headaches, Difficulty swallowing,Tooth/dental problems,Sore throat, No sneezing, itching, ear ache, nasal congestion, post nasal drip,  Cardio-vascular: No chest pain, Orthopnea, PND, swelling in lower extremities, dizziness, palpitations  GI: No heartburn, indigestion, abdominal pain, nausea, vomiting, diarrhea, change in bowel habits, loss of appetite  Resp: + cough, No shortness of breath with exertion or at rest. No excess mucus, H/O Hemoptysis with recent admission but no current problems. No change in color of mucus.No wheezing.No chest wall deformity  Skin: +rash from the Tarceva chemotherapy on the face, back and chest areas. H/O rash  with Tarceva.   GU: no dysuria, change in color of urine, no urgency or frequency. No flank pain.  Musculoskeletal: No joint pain or swelling. No decreased range of motion. No back pain.  Psych: No change in mood or affect. No depression or anxiety. No memory loss.  No problems with other systems  DENTAL HISTORY: CHIEF COMPLAINT: Patient referred by Dr. Julien Nordmann for a pre-Xgeva therapy dental protocol examination.  HPI: HAILEY STORMER is a 45 year old [place] female referred by Dr. Julien Nordmann for a dental consultation. Patient with lung cancer with metastases to the brain, adrenal gland, and bone. Patient with current active Tarceva chemotherapy. Patient with anticipated Xgeva therapy. Patient is now seen as part of a medically necessary pre-Xgeva therapy dental protocol examination.  Patient currently denies acute toothaches, swellings, or abscesses. Patient has never seen a dentist. Patient denies having the lower right third molar extracted. Patient is primarily complaining of "buildup" of calculus involving the mandibular anterior teeth on the lingual aspect.  DENTAL EXAMINATION: GENERAL: The patient is a well-developed, well-nourished female in no acute distress. HEAD AND NECK: There is no palpable submandibular lymphadenopathy. Patient denies acute TMJ symptoms. Patient has maximum interincisal opening of 35 mm. Facial rash/folliculitis from [place] is noted.  INTRAORAL EXAM: Patient has mid palatal torus. Patient has bilateral mandibular lingual tori. There is no evidence of oral abscess formation. Facial exostoses in the area of tooth numbers 6 through 7 and 10 through 11.  DENTITION: Patient is missing tooth #32. Tooth numbers 1 and 16 are impacted. PERIODONTAL: Patient has chronic periodontitis with plaque and calculus can relations, gingival recession, and incipient tooth mobility. Periodontal charting was deferred secondary to gross accumulation of plaque and calculus. DENTAL CARIES/SUBOPTIMAL  RESTORATIONS: Dental caries noted as per dental charting form. Patient will ideally needs bitewings to determine if additional interproximal caries are present. ENDODONTIC: no history of acute pulpitis symptoms. No obvious periapical radiolucencies noted. CROWN AND BRIDGE: No crown or bridge restorations.  PROSTHODONTIC: No history of partial dentures. OCCLUSION: Stable occlusion.   RADIOGRAPHIC INTERPRETATION: Orthopantogram was obtained. 8 periapical radiographs were obtained but full series unable to be completed due to significant gagging and vomiting. Patient is missing tooth #32. Tooth numbers 1 and 16 are impacted. There is incipient to moderate bone loss noted. No obvious periapical radiolucencies are noted. Ideally I would need a series of bitewings to determine if there are interproximal caries.   ASSESSMENTS: 1.  Lung cancer with metastases to brain, bone, and adrenal gland. 2. Tarceva chemotherapy daily. 3. Pre-[place] dental protocol examination 4. Chronic periodontitis with bone loss 5. Gingival recession 6. Accretions 7. Missing tooth #32 8. Impacted tooth numbers 1 and 16. 9. Dental caries 10. Stable occlusion  PLAN/RECOMMENDATIONS: 1. I discussed the risks, benefits, and complications of various treatment options with the patient in relationship to her medical and dental conditions, anticipated Xgeva therapy, and risk for osteonecrosis of the jaw related to the [facility] therapy with future invasive dental procedures.  We discussed various treatment options to include no treatment, multiple extractions with alveoloplasty, pre-prosthetic surgery as indicated, periodontal therapy, dental restorations, root canal therapy, crown and bridge therapy, implant therapy, and replacement of missing teeth as indicated. We also discussed referral to an oral surgeon, periodontist, or another primary dentist for comprehensive dental care.The refuses to have dental extractions and referral to an  oral surgeon at this time. Patient also refuses referral to periodontist. Patient did agree to follow-up with a primary dentist for exam, periodontal therapy as indicated, and dental restorations as needed. Release of information was obtained to send records to Dr. Quincy Simmonds and Quincy Simmonds for her initial periodontal therapy and dental restoration needs and overall comprehensive care.  2. Discussion of findings with medical team and coordination of future medical and dental care as needed.  I spent in excess of  90 minutes during the conduct of this consultation and >50% of this time involved direct face-to-face encounter for counseling and/or coordination of the patient's care.    Lenn Cal, DDS

## 2015-01-28 MED FILL — TARCEVA 100 MG TABLET: 100 | 30 days supply | Qty: 30 | Fill #0

## 2015-01-31 ENCOUNTER — Ambulatory Visit (HOSPITAL_COMMUNITY)
Admission: RE | Admit: 2015-01-31 | Discharge: 2015-01-31 | Disposition: A | Discharge status: Home / Self Care | Payer: Medicaid Other | Source: Ambulatory Visit | Attending: Radiation Oncology | Admitting: Radiation Oncology

## 2015-01-31 DIAGNOSIS — C3490 Malignant neoplasm of unspecified part of unspecified bronchus or lung: Secondary | ICD-10-CM | POA: Insufficient documentation

## 2015-01-31 DIAGNOSIS — Z923 Personal history of irradiation: Secondary | ICD-10-CM | POA: Insufficient documentation

## 2015-01-31 DIAGNOSIS — C7931 Secondary malignant neoplasm of brain: Secondary | ICD-10-CM | POA: Diagnosis not present

## 2015-01-31 MED ORDER — GADOBENATE DIMEGLUMINE 529 MG/ML IV SOLN
10.0000 mL | Freq: Once | INTRAVENOUS | Status: AC | PRN
  Administered 2015-01-31: 9 mL via INTRAVENOUS

## 2015-01-31 MED FILL — CLINDAMYCIN PH 1% GEL: 1 | 15 days supply | Qty: 30 | Fill #0

## 2015-02-02 ENCOUNTER — Ambulatory Visit (HOSPITAL_BASED_OUTPATIENT_CLINIC_OR_DEPARTMENT_OTHER): Payer: Medicaid Other | Admitting: Internal Medicine

## 2015-02-02 ENCOUNTER — Encounter: Payer: Self-pay | Admitting: Internal Medicine

## 2015-02-02 ENCOUNTER — Telehealth: Payer: Self-pay | Admitting: Internal Medicine

## 2015-02-02 ENCOUNTER — Other Ambulatory Visit (HOSPITAL_BASED_OUTPATIENT_CLINIC_OR_DEPARTMENT_OTHER): Payer: Medicaid Other

## 2015-02-02 ENCOUNTER — Ambulatory Visit: Payer: Medicaid Other

## 2015-02-02 VITALS — BP 110/56 | HR 87 | Temp 98.0°F | Resp 18 | Ht 60.0 in | Wt 102.5 lb

## 2015-02-02 DIAGNOSIS — C7951 Secondary malignant neoplasm of bone: Secondary | ICD-10-CM

## 2015-02-02 DIAGNOSIS — C342 Malignant neoplasm of middle lobe, bronchus or lung: Secondary | ICD-10-CM | POA: Diagnosis not present

## 2015-02-02 DIAGNOSIS — C7931 Secondary malignant neoplasm of brain: Secondary | ICD-10-CM

## 2015-02-02 DIAGNOSIS — K1230 Oral mucositis (ulcerative), unspecified: Secondary | ICD-10-CM | POA: Diagnosis not present

## 2015-02-02 DIAGNOSIS — R21 Rash and other nonspecific skin eruption: Secondary | ICD-10-CM | POA: Diagnosis not present

## 2015-02-02 DIAGNOSIS — E889 Metabolic disorder, unspecified: Secondary | ICD-10-CM

## 2015-02-02 DIAGNOSIS — M898X9 Other specified disorders of bone, unspecified site: Secondary | ICD-10-CM

## 2015-02-02 DIAGNOSIS — C3491 Malignant neoplasm of unspecified part of right bronchus or lung: Secondary | ICD-10-CM

## 2015-02-02 DIAGNOSIS — J069 Acute upper respiratory infection, unspecified: Secondary | ICD-10-CM

## 2015-02-02 DIAGNOSIS — Z5111 Encounter for antineoplastic chemotherapy: Secondary | ICD-10-CM

## 2015-02-02 DIAGNOSIS — M9080 Osteopathy in diseases classified elsewhere, unspecified site: Principal | ICD-10-CM

## 2015-02-02 HISTORY — DX: Acute upper respiratory infection, unspecified: J06.9

## 2015-02-02 LAB — CBC WITH DIFFERENTIAL/PLATELET
BASO%: 0.3 % (ref 0.0–2.0)
Basophils Absolute: 0 10*3/uL (ref 0.0–0.1)
EOS ABS: 0.2 10*3/uL (ref 0.0–0.5)
EOS%: 3.4 % (ref 0.0–7.0)
HEMATOCRIT: 40.4 % (ref 34.8–46.6)
HEMOGLOBIN: 13.6 g/dL (ref 11.6–15.9)
LYMPH#: 0.9 10*3/uL (ref 0.9–3.3)
LYMPH%: 14.7 % (ref 14.0–49.7)
MCH: 31 pg (ref 25.1–34.0)
MCHC: 33.7 g/dL (ref 31.5–36.0)
MCV: 92 fL (ref 79.5–101.0)
MONO#: 0.8 10*3/uL (ref 0.1–0.9)
MONO%: 13.6 % (ref 0.0–14.0)
NEUT%: 68 % (ref 38.4–76.8)
NEUTROS ABS: 4 10*3/uL (ref 1.5–6.5)
PLATELETS: 337 10*3/uL (ref 145–400)
RBC: 4.39 10*6/uL (ref 3.70–5.45)
RDW: 13.6 % (ref 11.2–14.5)
WBC: 5.9 10*3/uL (ref 3.9–10.3)

## 2015-02-02 LAB — COMPREHENSIVE METABOLIC PANEL
ALBUMIN: 3.2 g/dL — AB (ref 3.5–5.0)
ALK PHOS: 98 U/L (ref 40–150)
ALT: 9 U/L (ref 0–55)
ANION GAP: 7 meq/L (ref 3–11)
AST: 13 U/L (ref 5–34)
BILIRUBIN TOTAL: 0.62 mg/dL (ref 0.20–1.20)
BUN: 4.3 mg/dL — ABNORMAL LOW (ref 7.0–26.0)
CO2: 25 meq/L (ref 22–29)
CREATININE: 0.7 mg/dL (ref 0.6–1.1)
Calcium: 8.5 mg/dL (ref 8.4–10.4)
Chloride: 109 mEq/L (ref 98–109)
EGFR: >90 mL/min/{1.73_m2} (ref >90)
Glucose: 106 mg/dl (ref 70–140)
Potassium: 3.7 mEq/L (ref 3.5–5.1)
Sodium: 141 mEq/L (ref 136–145)
TOTAL PROTEIN: 6.3 g/dL — AB (ref 6.4–8.3)

## 2015-02-02 MED ORDER — MAGIC MOUTHWASH W/LIDOCAINE: 5.0000 mL | Freq: Four times a day (QID) | ORAL | Status: DC | PRN

## 2015-02-02 MED ORDER — LORATADINE 10 MG PO TABS: 10.0000 mg | ORAL_TABLET | Freq: Every day | ORAL | Status: DC

## 2015-02-02 MED ORDER — DOXYCYCLINE HYCLATE 100 MG PO TABS: 100.0000 mg | ORAL_TABLET | Freq: Two times a day (BID) | ORAL | Status: DC

## 2015-02-02 MED ORDER — CLINDAMYCIN PHOSPHATE 1 % EX GEL: Freq: Two times a day (BID) | CUTANEOUS | Status: DC

## 2015-02-02 MED ORDER — DENOSUMAB 120 MG/1.7ML ~~LOC~~ SOLN
120.0000 mg | Freq: Once | SUBCUTANEOUS | Status: DC
  Filled 2015-02-02: qty 1.7

## 2015-02-02 MED FILL — DOXYCYCLINE HYCLATE 100 MG: 100 | 10 days supply | Qty: 20 | Fill #0

## 2015-02-02 MED FILL — LORATADINE 10 MG TABLET: 10 | 20 days supply | Qty: 20 | Fill #0

## 2015-02-02 MED FILL — MAGIC MOUTHWASH W/LIDO 1:1: 12 days supply | Qty: 240 | Fill #0

## 2015-02-02 NOTE — Progress Notes (Signed)
[facility] [phone]   Fax:([phone]  OFFICE PROGRESS NOTE  [name], MD [place] [address]  DIAGNOSIS: Stage IV (T2a, N1, M1b) non-small cell lung cancer, adenocarcinoma presented with right middle lobe lung mass in addition to right hilar adenopathy as well as metastatic disease to the bone, brain as well as left adrenal metastasis diagnosed in July 2016  PRIOR THERAPY:  1) palliative radiotherapy to the brain as well as metastatic bone lesions in the pelvis.  CURRENT THERAPY: Tarceva 150 mg by mouth daily started on 09/25/2014.  INTERVAL HISTORY: Deborah Foster 45 y.o. female returns to the clinic today for hospital follow-up visit. The patient is tolerating her current treatment with Tarceva fairly well except for grade 2 skin rash mainly on the face and upper chest. She is running out of her clindamycin lotion and doxycycline. She is applying moisture cream to these area. She denied having any significant diarrhea. She had upper respiratory infection recently. She has no significant weight loss. She denied having any significant chest pain, shortness of breath, or hemoptysis. The patient denied having any headache or visual changes. She has no significant nausea or vomiting. She has no fever or chills. She is here today for evaluation and repeat blood work.  MEDICAL HISTORY: Past Medical History  Diagnosis Date  . Pneumonia   . Hemoptysis   . Lung mass   . Hypokalemia   . Radiation 08/23/14-09/07/14    Brain/chest and left hip 30 Gy 12 Fx  . lung ca dx'd 07/2014  . Metastasis to brain ([facility])   . Metastasis to adrenal gland ([facility])   . Bone metastasis ([facility])     ALLERGIES:  is allergic to doxycycline.  MEDICATIONS:  Current Outpatient Prescriptions  Medication Sig Dispense Refill  . albuterol (PROVENTIL HFA;VENTOLIN HFA) 108 (90 BASE) MCG/ACT inhaler Inhale 2 puffs into the lungs every 6 (six) hours as needed  for wheezing or shortness of breath. 1 Inhaler 2  . benzonatate (TESSALON PERLES) 100 MG capsule Take 1 capsule (100 mg total) by mouth 3 (three) times daily as needed for cough. 30 capsule 0  . clindamycin (CLINDAGEL) 1 % gel Apply topically 2 (two) times daily. 30 g 1  . erlotinib (TARCEVA) 100 MG tablet Take 1 tablet (100 mg total) by mouth daily. Take on an empty stomach 1 hour before meals or 2 hours after 30 tablet 2  . promethazine-codeine (PHENERGAN WITH CODEINE) 6.25-10 MG/5ML syrup Take 5 mLs by mouth every 4 (four) hours as needed. (Patient taking differently: Take 5 mLs by mouth every 4 (four) hours as needed for cough. ) 120 mL 0  . ondansetron (ZOFRAN) 4 MG tablet Take 1 tablet (4 mg total) by mouth every 8 (eight) hours as needed for nausea or vomiting. (Patient not taking: Reported on 01/05/2015) 30 tablet 0  . prochlorperazine (COMPAZINE) 10 MG tablet Take 1 tablet (10 mg total) by mouth every 6 (six) hours as needed for nausea or vomiting. (Patient not taking: Reported on 01/05/2015) 30 tablet 0   No current facility-administered medications for this visit.    SURGICAL HISTORY:  Past Surgical History  Procedure Laterality Date  . Video bronchoscopy Bilateral 08/09/2014    Procedure: VIDEO BRONCHOSCOPY WITHOUT FLUORO;  Surgeon: [name], MD;  Location: [facility];  Service: Cardiopulmonary;  Laterality: Bilateral;    REVIEW OF SYSTEMS:  Constitutional: negative Eyes: negative Ears, nose, mouth, throat, and face: negative  Respiratory: positive for cough Cardiovascular: negative Gastrointestinal: negative Genitourinary:negative Integument/breast: positive for dryness and rash Hematologic/lymphatic: negative Musculoskeletal:positive for bone pain Neurological: negative Behavioral/Psych: negative Endocrine: negative Allergic/Immunologic: negative   PHYSICAL EXAMINATION: General appearance: alert, cooperative, fatigued and no distress Head: Normocephalic,  without obvious abnormality, atraumatic Neck: no adenopathy, no JVD, supple, symmetrical, trachea midline and thyroid not enlarged, symmetric, no tenderness/mass/nodules Lymph nodes: Cervical, supraclavicular, and axillary nodes normal. Resp: clear to auscultation bilaterally Back: symmetric, no curvature. ROM normal. No CVA tenderness. Cardio: regular rate and rhythm, S1, S2 normal, no murmur, click, rub or gallop GI: soft, non-tender; bowel sounds normal; no masses,  no organomegaly Extremities: extremities normal, atraumatic, no cyanosis or edema Neurologic: Alert and oriented X 3, normal strength and tone. Normal symmetric reflexes. Normal coordination and gait  Skin exam showed maculopapular grade 2 skin rash on the face and upper chest.  ECOG PERFORMANCE STATUS: 1 - Symptomatic but completely ambulatory  There were no vitals taken for this visit.  LABORATORY DATA: Lab Results  Component Value Date   WBC 5.9 02/02/2015   HGB 13.6 02/02/2015   HCT 40.4 02/02/2015   MCV 92.0 02/02/2015   PLT 337 02/02/2015      Chemistry      Component Value Date/Time   NA 137 01/09/2015 0416   NA 140 01/05/2015 1135   K 3.6 01/09/2015 0416   K 3.6 01/05/2015 1135   CL 104 01/09/2015 0416   CO2 24 01/09/2015 0416   CO2 24 01/05/2015 1135   BUN 8 01/09/2015 0416   BUN 4.2* 01/05/2015 1135   CREATININE 0.51 01/09/2015 0416   CREATININE 0.7 01/05/2015 1135      Component Value Date/Time   CALCIUM 8.7* 01/09/2015 0416   CALCIUM 8.7 01/05/2015 1135   ALKPHOS 86 01/08/2015 0949   ALKPHOS 90 01/05/2015 1135   AST 18 01/08/2015 0949   AST 14 01/05/2015 1135   ALT 13* 01/08/2015 0949   ALT 11 01/05/2015 1135   BILITOT 0.8 01/08/2015 0949   BILITOT 0.88 01/05/2015 1135       RADIOGRAPHIC STUDIES: Dg Chest 2 View  01/08/2015  CLINICAL DATA:  Patient with lung cancer with new hemoptysis this morning. EXAM: CHEST  2 VIEW COMPARISON:  CT of the chest 11/24/2014, chest radiograph  08/24/2014. FINDINGS: Cardiomediastinal silhouette is normal. Mediastinal contours appear intact. Patient has a known right lower lobe neoplasm, which projects as dense airspace opacity in the mid right lung. There is however an increased airspace consolidations surrounding this finding in the right middle and right lower lobe, which may represent increase in postobstructive atelectasis versus developing of airspace consolidation. The left lung is clear. There is no evidence of pleural effusion all pneumothorax. Osseous structures are without acute abnormality. Soft tissues are grossly normal. IMPRESSION: Interval increase in airspace opacity surrounding known right lung neoplasm, which may represent increase a postobstructive atelectasis or development of airspace consolidation. Intraparenchymal hemorrhage cannot be excluded, given patient's symptoms of new hemoptysis. Electronically Signed   By: Fidela Salisbury M.D.   On: 01/08/2015 09:58   Ct Angio Chest Pe W/cm &/or Wo Cm  01/08/2015  CLINICAL DATA:  Hemoptysis. Lung cancer diagnosed in July 2016. Patient undergoing radiation therapy. EXAM: CT ANGIOGRAPHY CHEST WITH CONTRAST TECHNIQUE: Multidetector CT imaging of the chest was performed using the standard protocol during bolus administration of intravenous contrast. Multiplanar CT image reconstructions and MIPs were obtained to evaluate the vascular anatomy. CONTRAST:  172m OMNIPAQUE IOHEXOL 350 MG/ML SOLN COMPARISON:  CT 11/24/2014 FINDINGS: Mediastinum/Nodes: No filling defects the pulmonary suggest acute pulmonary embolism. No acute findings aorta great vessels. No pericardial fluid Lungs/Pleura: There is perihilar consolidation in the RIGHT middle lobe, RIGHT upper lobe and RIGHT lower lobe which has contracted somewhat from comparison exam but not significantly changed. There is bronchiectasis and air bronchograms through this perihilar consolidated pattern. There is no pleural fluid in the RIGHT  hemi thorax. No suspicious nodularity. The LEFT lung is clear. The central airways appear normal. No fluid within airways identified. Upper abdomen: Limited view of the liver, kidneys, pancreas are unremarkable. Normal adrenal glands. Musculoskeletal: Stable sclerotic metastasis within the sternum. Review of the MIP images confirms the above findings. IMPRESSION: 1. No evidence of acute pulmonary embolism. 2. Perihilar consolidation in the RIGHT hemi thorax is very similar to comparison exam of CT 11/24/2014. There may be slight contraction of the consolidation. Extensive air bronchograms. No fluid within the bronchial tree identified. 3. Sclerotic metastasis within the sternum unchanged. Electronically Signed   By: Suzy Bouchard M.D.   On: 01/08/2015 11:44   Mr Jeri Cos UJ Contrast  01/31/2015  CLINICAL DATA:  45 year old female with lung cancer and known brain metastases. Prior radiation therapy. Evaluate for progression. EXAM: MRI HEAD WITHOUT AND WITH CONTRAST TECHNIQUE: Multiplanar, multiecho pulse sequences of the brain and surrounding structures were obtained without and with intravenous contrast. CONTRAST:  69m MULTIHANCE GADOBENATE DIMEGLUMINE 529 MG/ML IV SOLN COMPARISON:  Two most recent exam 10/29/2014 and 08/16/2014. FINDINGS: Residual intracranial metastatic lesions appear similar or slightly improved from the 10/29/2014 exam. Dominant residual index lesions include: Posterior left temporal lobe 1.9 x 1.1 x 1.2 cm lesion. Peripheral blood breakdown products. Decrease in amount of surrounding vasogenic edema. Anterior left frontal lobe 1.3 x 0.9 x 1.1 cm lesion. Decrease in amount of surrounding vasogenic edema. Anterior medial right frontal lobe 0.9 x 0.9 x 0.8 mm lesion. Smaller lesions unchanged as indicated by presence of small blood breakdown products (such as in the posterior right temporal-occipital region, medial right occipital lobe, medial right parietal lobe and left cerebellum). No new  intracranial lesion. Calvarial and clival metastatic lesions without change. No acute infarct. No intracranial hemorrhage separate from the hemorrhagic metastatic lesions. Major intracranial vascular structures are patent. Partial opacification left mastoid air cells without obstructing lesion noted. Minimal mucosal thickening ethmoid sinus air cells. Small developmental venous anomaly left cerebellum suspected. IMPRESSION: Intracranial metastatic disease appears similar to slightly improved when compared to the 10/29/2014 exam as noted above. Electronically Signed   By: SGenia DelM.D.   On: 01/31/2015 13:08    ASSESSMENT AND PLAN: This is a very pleasant 45years old Asian female never smoker with recently diagnosed with stage IV non-small cell lung cancer, adenocarcinoma presented with right middle lobe lung mass in addition to right hilar adenopathy and metastatic disease to the bones, brain and left adrenal gland.  She status post whole brain irradiation with some improvement in her disease on the recent MRI of the brain. The patient is currently on treatment with Tarceva 150 mg by mouth daily and tolerating it fairly well except for grade 2 skin rash and recent episodes of diarrhea. She still have 2 weeks supply of the Tarceva 150 mg. She is expected to change to 100 mg by mouth daily after completion of the remaining doses. For the skin rash, I gave the patient refill of clindamycin gel as well as doxycycline. For the metastatic bone lesions, I would consider the patient  for treatment with Xgeva after receiving dental clearance. I referred her to Dr. Enrique Sack for evaluation and dental clearance before starting the first dose of Xgeva. She still needed some dental work done before clearance. She was also given a refill for Magic mouthwash in addition to Claritin for allergy. She will come back for follow-up visit in one month for reevaluation with repeat blood work. She was advised to call  immediately if she has any concerning symptoms in the interval. The patient voices understanding of current disease status and treatment options and is in agreement with the current care plan.  All questions were answered. The patient knows to call the clinic with any problems, questions or concerns. We can certainly see the patient much sooner if necessary.  Disclaimer: This note was dictated with voice recognition software. Similar sounding words can inadvertently be transcribed and may not be corrected upon review.

## 2015-02-02 NOTE — Telephone Encounter (Signed)
per pof to sch pt appt-gave pt copy of avs °

## 2015-02-02 NOTE — Telephone Encounter (Signed)
per pof to sch pt ppt-gave pt copy of avs

## 2015-02-03 NOTE — Progress Notes (Signed)
   Department of Radiation Oncology  Phone:  [phone] Fax:        [phone]   Name: Deborah Foster MRN: [id number]  DOB: [birth year]  Date: 02/04/2015  Follow Up Visit Note  Diagnosis: Non-small cell carcinoma of lung, stage 4   Staging form: Lung, AJCC 7th Edition     Clinical stage from 08/26/2014: Stage IV (T2a, N1, M1b) - Signed by [name], MD on 08/26/2014  Summary and Interval since last radiation: 1 month The patient's radiation treatment dates extended from 08/23/2014-09/07/2014. The site and dose used includes the brain, right chest and left hip treated to 30 Gy in 12 fractions. The beams and energy used includes the Brain with opposed laterals and reduced fields, chest and hip were treated AP/PA. Mixed energies were used with 6, 10 and 15 MV photons.   Interval History: Deborah Foster presents today for routine follow-up. She is feeling well. She continues to have a cough. She has hip pain 2/10 for which she does not take medications. She is taking Tarceva and has been put on topical and oral treatment for the rash. She is present with an interpreter. She had a headache on Wednesday but nothing since. She denies weakness and seizures. She had an MRI last week which showed an excellent response to treatment.  She complains of hematuria starting this morning.   Denies flank pain, dysuria or frequency.   Physical Exam: The patient is alert and oriented. Rash over face.  Filed Vitals:   02/04/15 1547  BP: 110/37  Pulse: 91  Temp: 98 F (36.7 C)  Height: 5' (1.524 m)  Weight: 102 lb (46.267 kg)  SpO2: 100%    IMPRESSION: [name] is a 45 y.o. female with Stage 4 non-small cell lung cancer with good response to treatment  PLAN: She looks great. Her scan looks good. Repeat MRI in 3 months. Follow up with medical oncology regarding rash/tarceva. Check UA for hematuria.   All vocalized questions and concerns have been addressed. If the patient develops any further questions or  concerns in regards to her treatment and recovery, she has been encouraged to contact Dr. Pablo Ledger, MD.   ------------------------------------------------  Thea Silversmith, MD    This document serves as a record of services personally performed by Thea Silversmith, MD. It was created on her behalf by  Lendon Collar, a trained medical scribe. The creation of this record is based on the scribe's personal observations and the provider's statements to them. This document has been checked and approved by the attending provider.

## 2015-02-04 ENCOUNTER — Ambulatory Visit
Admission: RE | Admit: 2015-02-04 | Discharge: 2015-02-04 | Disposition: A | Discharge status: Home / Self Care | Payer: Medicaid Other | Source: Ambulatory Visit | Attending: Radiation Oncology | Admitting: Radiation Oncology

## 2015-02-04 ENCOUNTER — Encounter: Payer: Self-pay | Admitting: Radiation Oncology

## 2015-02-04 ENCOUNTER — Other Ambulatory Visit (HOSPITAL_COMMUNITY)
Admission: RE | Admit: 2015-02-04 | Discharge: 2015-02-04 | Disposition: A | Discharge status: Home / Self Care | Payer: Medicaid Other | Source: Other Acute Inpatient Hospital | Attending: Radiation Oncology | Admitting: Radiation Oncology

## 2015-02-04 VITALS — BP 110/37 | HR 91 | Temp 98.0°F | Ht 60.0 in | Wt 102.0 lb

## 2015-02-04 DIAGNOSIS — C7931 Secondary malignant neoplasm of brain: Secondary | ICD-10-CM

## 2015-02-04 DIAGNOSIS — R300 Dysuria: Secondary | ICD-10-CM | POA: Insufficient documentation

## 2015-02-04 DIAGNOSIS — R319 Hematuria, unspecified: Secondary | ICD-10-CM

## 2015-02-04 HISTORY — DX: Acute upper respiratory infection, unspecified: J06.9

## 2015-02-04 LAB — RENAL FUNCTION PANEL
ALBUMIN: 3.6 g/dL (ref 3.5–5.0)
ANION GAP: 7 (ref 5–15)
BUN: 5 mg/dL — ABNORMAL LOW (ref 6–20)
CALCIUM: 8.6 mg/dL — AB (ref 8.9–10.3)
CO2: 24 mmol/L (ref 22–32)
Chloride: 106 mmol/L (ref 101–111)
Creatinine, Ser: 0.63 mg/dL (ref 0.44–1.00)
Glucose, Bld: 89 mg/dL (ref 65–99)
PHOSPHORUS: 4 mg/dL (ref 2.5–4.6)
Potassium: 3.9 mmol/L (ref 3.5–5.1)
SODIUM: 137 mmol/L (ref 135–145)

## 2015-02-04 LAB — URINALYSIS, ROUTINE W REFLEX MICROSCOPIC
Bilirubin Urine: NEGATIVE
Glucose, UA: NEGATIVE mg/dL
KETONES UR: NEGATIVE mg/dL
NITRITE: NEGATIVE
PROTEIN: NEGATIVE mg/dL
Specific Gravity, Urine: 1.007 (ref 1.005–1.030)
pH: 7.5 (ref 5.0–8.0)

## 2015-02-04 LAB — URINE MICROSCOPIC-ADD ON

## 2015-02-04 NOTE — Progress Notes (Signed)
[place] to the main lab to have renal function panel drawn, ua routine and ua microscopic taken to the lab

## 2015-02-04 NOTE — Progress Notes (Signed)
Deborah Foster is here today for a F/U visit.  Will have her MRI of brain results discusses with her vy Dr. Pablo Ledger.  Reports pain level of 2 to left hip she says she does not want to take any pain medication.  Appetite has been altered  by her swallowing difficulty. Denies any breathing problems; having some swallowing problems.  Has a facial,chest,head and back rash using Clindamycin cream.  Energy level is low.  Skin to chest with slight redness. Coughing a lot.   02-02-15 Saw Dr. Julien Nordmann  BP 110/37 mmHg  Pulse 91  Temp(Src) 98 F (36.7 C)  Ht 5' (1.524 m)  Wt 102 lb (46.267 kg)  BMI 19.92 kg/m2  SpO2 100%  Wt Readings from Last 3 Encounters:  02/04/15 102 lb (46.267 kg)  02/02/15 102 lb 8 oz (46.494 kg)  01/08/15 98 lb 8.7 oz (44.7 kg)

## 2015-02-07 ENCOUNTER — Telehealth: Payer: Self-pay | Admitting: *Deleted

## 2015-02-07 NOTE — Telephone Encounter (Signed)
CALLED PATIENT TO INFORM OF LAB, MRI AND FU, SPOKE WITH MS. Mcaffee AND SHE IS AWARE OF THESE APPTS.

## 2015-02-08 ENCOUNTER — Telehealth: Payer: Self-pay | Admitting: *Deleted

## 2015-02-08 ENCOUNTER — Other Ambulatory Visit: Payer: Self-pay | Admitting: Radiation Oncology

## 2015-02-08 DIAGNOSIS — R319 Hematuria, unspecified: Secondary | ICD-10-CM

## 2015-02-08 MED ORDER — CIPROFLOXACIN HCL 500 MG PO TABS: 500.0000 mg | ORAL_TABLET | Freq: Two times a day (BID) | ORAL | Status: DC

## 2015-02-08 NOTE — Telephone Encounter (Signed)
Called asked to go by her pharmacy to pick a new prescription Cipro ordered by Dr. Pablo Ledger from her Friday, 02-04-15 visit.

## 2015-02-10 ENCOUNTER — Telehealth: Payer: Self-pay | Admitting: *Deleted

## 2015-02-10 NOTE — Telephone Encounter (Signed)
Patient called and stated,"my right lung hurts and I've been bleeding from my mouth since 1:00 am. Can Dr. Julien Nordmann see me today?" Instructed patient that Dr. Julien Nordmann doesn't have an opening today, and Selena Lesser, NP, is on vacation this week. Patient stated,"it's not my lung that hurts it's my right hand, no, it's my right breast. I don't know what hurts, it's all on my right side." Instructed patient to go to an Urgent Care or go to the ER further evaluation. Patient stated,"I can wait til tomorrow so, Dr. Julien Nordmann can see me." Instructed patient that she needs to be seen today and evaluated. Patient verbalized understanding.

## 2015-02-14 ENCOUNTER — Ambulatory Visit (HOSPITAL_COMMUNITY): Payer: No Typology Code available for payment source

## 2015-02-15 ENCOUNTER — Encounter (HOSPITAL_COMMUNITY): Payer: Self-pay

## 2015-02-15 ENCOUNTER — Emergency Department (HOSPITAL_COMMUNITY)
Admission: EM | Admit: 2015-02-15 | Discharge: 2015-02-16 | Disposition: A | Discharge status: Home / Self Care | Payer: Medicaid Other | Attending: Emergency Medicine | Admitting: Emergency Medicine

## 2015-02-15 ENCOUNTER — Emergency Department (HOSPITAL_COMMUNITY): Payer: Medicaid Other

## 2015-02-15 DIAGNOSIS — Z792 Long term (current) use of antibiotics: Secondary | ICD-10-CM | POA: Diagnosis not present

## 2015-02-15 DIAGNOSIS — R079 Chest pain, unspecified: Secondary | ICD-10-CM | POA: Insufficient documentation

## 2015-02-15 DIAGNOSIS — Z85830 Personal history of malignant neoplasm of bone: Secondary | ICD-10-CM | POA: Diagnosis not present

## 2015-02-15 DIAGNOSIS — Z923 Personal history of irradiation: Secondary | ICD-10-CM | POA: Insufficient documentation

## 2015-02-15 DIAGNOSIS — Z8639 Personal history of other endocrine, nutritional and metabolic disease: Secondary | ICD-10-CM | POA: Insufficient documentation

## 2015-02-15 DIAGNOSIS — Z85858 Personal history of malignant neoplasm of other endocrine glands: Secondary | ICD-10-CM | POA: Diagnosis not present

## 2015-02-15 DIAGNOSIS — Z8701 Personal history of pneumonia (recurrent): Secondary | ICD-10-CM | POA: Diagnosis not present

## 2015-02-15 DIAGNOSIS — Z8709 Personal history of other diseases of the respiratory system: Secondary | ICD-10-CM | POA: Insufficient documentation

## 2015-02-15 DIAGNOSIS — Z79899 Other long term (current) drug therapy: Secondary | ICD-10-CM | POA: Diagnosis not present

## 2015-02-15 DIAGNOSIS — R042 Hemoptysis: Secondary | ICD-10-CM | POA: Insufficient documentation

## 2015-02-15 DIAGNOSIS — K920 Hematemesis: Secondary | ICD-10-CM | POA: Diagnosis present

## 2015-02-15 DIAGNOSIS — Z85841 Personal history of malignant neoplasm of brain: Secondary | ICD-10-CM | POA: Insufficient documentation

## 2015-02-15 DIAGNOSIS — C3490 Malignant neoplasm of unspecified part of unspecified bronchus or lung: Secondary | ICD-10-CM | POA: Diagnosis not present

## 2015-02-15 NOTE — ED Notes (Signed)
Delay in lab draw,pt in exray

## 2015-02-15 NOTE — ED Notes (Addendum)
Patient has a hx of lung cancer. Patient states she has been coughing for 5-6 months. Patient states she began to cough up blood today about 1 hour ago.

## 2015-02-15 NOTE — ED Notes (Signed)
Pt complains of coughing up blood and hurting on the right side, she takes chemo medication and has a rash on her face

## 2015-02-16 LAB — BASIC METABOLIC PANEL
ANION GAP: 9 (ref 5–15)
BUN: 5 mg/dL — ABNORMAL LOW (ref 6–20)
CO2: 23 mmol/L (ref 22–32)
CREATININE: 0.7 mg/dL (ref 0.44–1.00)
Calcium: 8.6 mg/dL — ABNORMAL LOW (ref 8.9–10.3)
Chloride: 106 mmol/L (ref 101–111)
GFR calc non Af Amer: >60 mL/min (ref >60)
Glucose, Bld: 101 mg/dL — ABNORMAL HIGH (ref 65–99)
Potassium: 4 mmol/L (ref 3.5–5.1)
Sodium: 138 mmol/L (ref 135–145)

## 2015-02-16 LAB — CBC WITH DIFFERENTIAL/PLATELET
Basophils Absolute: 0 10*3/uL (ref 0.0–0.1)
Basophils Relative: 0 %
EOS ABS: 0.2 10*3/uL (ref 0.0–0.7)
Eosinophils Relative: 3 %
HEMATOCRIT: 41.5 % (ref 36.0–46.0)
HEMOGLOBIN: 13.4 g/dL (ref 12.0–15.0)
LYMPHS ABS: 0.8 10*3/uL (ref 0.7–4.0)
Lymphocytes Relative: 15 %
MCH: 30.4 pg (ref 26.0–34.0)
MCHC: 32.3 g/dL (ref 30.0–36.0)
MCV: 94.1 fL (ref 78.0–100.0)
MONOS PCT: 13 %
Monocytes Absolute: 0.7 10*3/uL (ref 0.1–1.0)
NEUTROS PCT: 69 %
Neutro Abs: 3.7 10*3/uL (ref 1.7–7.7)
Platelets: 486 10*3/uL — ABNORMAL HIGH (ref 150–400)
RBC: 4.41 MIL/uL (ref 3.87–5.11)
RDW: 13.2 % (ref 11.5–15.5)
WBC: 5.4 10*3/uL (ref 4.0–10.5)

## 2015-02-16 NOTE — ED Provider Notes (Signed)
CSN: [id number]     Arrival date & time 02/15/15  2249 History   First MD Initiated Contact with Patient 02/15/15 2321     Chief Complaint  Patient presents with  . Hematemesis     (Consider location/radiation/quality/duration/timing/severity/associated sxs/prior Treatment) HPI Comments: Patient with a history of lung cancer currently being treated with chemotherapy presents with right side chest pain and cough, which is chronic for her. Yesterday she started seeing bloody sputum when coughing similar to when she was admitted for same 12/2014 and discharged home with resolution of hemoptysis without intervention. She reports history of hemoptysis requiring bronchoscopy in the past. No fever. No nausea, vomiting or abdominal pain.   The history is provided by the patient. No language interpreter was used.    Past Medical History  Diagnosis Date  . Pneumonia   . Hemoptysis   . Lung mass   . Hypokalemia   . Radiation 08/23/14-09/07/14    Brain/chest and left hip 30 Gy 12 Fx  . lung ca dx'd 07/2014  . Metastasis to brain ([place])   . Metastasis to adrenal gland ([place])   . Bone metastasis ([place])   . URI (upper respiratory infection) 02-02-2015   Past Surgical History  Procedure Laterality Date  . Video bronchoscopy Bilateral 08/09/2014    Procedure: VIDEO BRONCHOSCOPY WITHOUT FLUORO;  Surgeon: [name], MD;  Location: [facility];  Service: Cardiopulmonary;  Laterality: Bilateral;   History reviewed. No pertinent family history. Social History  Substance Use Topics  . Smoking status: Never Smoker   . Smokeless tobacco: Never Used  . Alcohol Use: No   OB History    No data available     Review of Systems  Constitutional: Negative for fever.  HENT: Negative for congestion, sore throat and trouble swallowing.   Respiratory: Positive for cough.   Cardiovascular: Positive for chest pain.  Gastrointestinal: Negative for nausea and vomiting.  Musculoskeletal: Negative for  myalgias.  Skin: Negative for color change and pallor.  Neurological: Negative for weakness.      Allergies  Doxycycline  Home Medications   Prior to Admission medications   Medication Sig Start Date End Date Taking? Authorizing Provider  albuterol (PROVENTIL HFA;VENTOLIN HFA) 108 (90 BASE) MCG/ACT inhaler Inhale 2 puffs into the lungs every 6 (six) hours as needed for wheezing or shortness of breath. 08/17/14  Yes Thurnell Lose, MD  benzonatate (TESSALON PERLES) 100 MG capsule Take 1 capsule (100 mg total) by mouth 3 (three) times daily as needed for cough. 01/09/15  Yes Velvet Bathe, MD  clindamycin (CLINDAGEL) 1 % gel Apply topically 2 (two) times daily. 02/02/15  Yes [name], MD  erlotinib (TARCEVA) 100 MG tablet Take 1 tablet (100 mg total) by mouth daily. Take on an empty stomach 1 hour before meals or 2 hours after 01/18/15  Yes [name], MD  magic mouthwash w/lidocaine SOLN Take 5 mLs by mouth 4 (four) times daily as needed for mouth pain. 02/02/15  Yes [name], MD  prochlorperazine (COMPAZINE) 10 MG tablet Take 1 tablet (10 mg total) by mouth every 6 (six) hours as needed for nausea or vomiting. 12/07/14  Yes [name], MD  ciprofloxacin (CIPRO) 500 MG tablet Take 1 tablet (500 mg total) by mouth 2 (two) times daily. Patient not taking: Reported on 02/15/2015 02/08/15   Thea Silversmith, MD  doxycycline (VIBRA-TABS) 100 MG tablet Take 1 tablet (100 mg total) by mouth 2 (two) times daily. Patient not taking: Reported on 02/15/2015  02/02/15   [name], MD  loratadine (CLARITIN) 10 MG tablet Take 1 tablet (10 mg total) by mouth daily. Patient not taking: Reported on 02/15/2015 02/02/15   [name], MD  ondansetron (ZOFRAN) 4 MG tablet Take 1 tablet (4 mg total) by mouth every 8 (eight) hours as needed for nausea or vomiting. Patient not taking: Reported on 01/05/2015 08/17/14   Thurnell Lose, MD  promethazine-codeine [facility] WITH CODEINE) 6.25-10  MG/5ML syrup Take 5 mLs by mouth every 4 (four) hours as needed. Patient not taking: Reported on 02/15/2015 01/05/15   [name], MD   BP 102/65 mmHg  Pulse 75  Temp(Src) 97.8 F (36.6 C) (Oral)  Resp 14  Ht 5' (1.524 m)  Wt 46.267 kg  BMI 19.92 kg/m2  SpO2 98% Physical Exam  Constitutional: She is oriented to person, place, and time. She appears well-developed and well-nourished.  HENT:  Head: Normocephalic.  Neck: Normal range of motion. Neck supple.  Cardiovascular: Normal rate and regular rhythm.   Pulmonary/Chest: Effort normal and breath sounds normal. No tachypnea. No respiratory distress. She has no decreased breath sounds. She has no wheezes. She has no rhonchi. She has no rales.    Right lateral chest wall tenderness. No swelling.  Abdominal: Soft. Bowel sounds are normal. There is no tenderness. There is no rebound and no guarding.  Musculoskeletal: Normal range of motion.  Neurological: She is alert and oriented to person, place, and time.  Skin: Skin is warm and dry. No rash noted.  Psychiatric: She has a normal mood and affect.    ED Course  Procedures (including critical care time) Labs Review Labs Reviewed  BASIC METABOLIC PANEL - Abnormal; Notable for the following:    Glucose, Bld 101 (*)    BUN 5 (*)    Calcium 8.6 (*)    All other components within normal limits  CBC WITH DIFFERENTIAL/PLATELET - Abnormal; Notable for the following:    Platelets 486 (*)    All other components within normal limits    Imaging Review Dg Chest 2 View  02/16/2015  CLINICAL DATA:  Right-sided chest pain and hemoptysis tonight. Lung cancer with radiation history EXAM: CHEST  2 VIEW COMPARISON:  01/08/2015 FINDINGS: Right perihilar opacity with angular margins and volume loss has an unchanged appearance. Elsewhere, the lungs are clear. Normal heart size and stable mediastinal contours. Known osseous metastatic disease without acute osseous finding. IMPRESSION: Treated  right lung cancer with unchanged appearance. No change from 01/08/2015 to suggest acute disease. Electronically Signed   By: Monte Fantasia M.D.   On: 02/16/2015 00:22   I have personally reviewed and evaluated these images and lab results as part of my medical decision-making.   EKG Interpretation None      MDM   Final diagnoses:  None    1. Hemoptysis  The patient is seen and evaluated by Dr. Venora Maples. No further cough or bleeding in the emergency department. VSS. The patient looks well and is in NAD. She reports she is comfortable.   Discussed discharge home vs hospital admission and patient prefers to be discharged home. She states if symptoms recurred or worsened she lives close to the hospital and would return promptly. Patient is discharged home in stable condition.    Charlann Lange, PA-C 02/16/15 [place], MD 02/16/15 [phone]

## 2015-02-16 NOTE — ED Notes (Signed)
MD at bedside. 

## 2015-02-16 NOTE — ED Notes (Signed)
Discharge instructions and follow up care reviewed with patient. Patient verbalized understanding. 

## 2015-03-02 ENCOUNTER — Other Ambulatory Visit (HOSPITAL_BASED_OUTPATIENT_CLINIC_OR_DEPARTMENT_OTHER): Payer: Medicaid Other

## 2015-03-02 ENCOUNTER — Encounter: Payer: Self-pay | Admitting: Internal Medicine

## 2015-03-02 ENCOUNTER — Other Ambulatory Visit: Payer: Self-pay | Admitting: Medical Oncology

## 2015-03-02 ENCOUNTER — Ambulatory Visit: Payer: Medicaid Other

## 2015-03-02 ENCOUNTER — Telehealth: Payer: Self-pay | Admitting: Internal Medicine

## 2015-03-02 ENCOUNTER — Telehealth: Payer: Self-pay | Admitting: Medical Oncology

## 2015-03-02 ENCOUNTER — Ambulatory Visit (HOSPITAL_BASED_OUTPATIENT_CLINIC_OR_DEPARTMENT_OTHER): Payer: Medicaid Other | Admitting: Internal Medicine

## 2015-03-02 VITALS — BP 110/42 | HR 88 | Temp 97.8°F | Resp 18 | Ht 60.0 in | Wt 103.8 lb

## 2015-03-02 DIAGNOSIS — M898X9 Other specified disorders of bone, unspecified site: Secondary | ICD-10-CM

## 2015-03-02 DIAGNOSIS — C342 Malignant neoplasm of middle lobe, bronchus or lung: Secondary | ICD-10-CM

## 2015-03-02 DIAGNOSIS — C7951 Secondary malignant neoplasm of bone: Secondary | ICD-10-CM | POA: Diagnosis not present

## 2015-03-02 DIAGNOSIS — R21 Rash and other nonspecific skin eruption: Secondary | ICD-10-CM

## 2015-03-02 DIAGNOSIS — E889 Metabolic disorder, unspecified: Secondary | ICD-10-CM

## 2015-03-02 DIAGNOSIS — C7931 Secondary malignant neoplasm of brain: Secondary | ICD-10-CM

## 2015-03-02 DIAGNOSIS — Z5111 Encounter for antineoplastic chemotherapy: Secondary | ICD-10-CM

## 2015-03-02 DIAGNOSIS — M9080 Osteopathy in diseases classified elsewhere, unspecified site: Principal | ICD-10-CM

## 2015-03-02 DIAGNOSIS — C7972 Secondary malignant neoplasm of left adrenal gland: Secondary | ICD-10-CM

## 2015-03-02 DIAGNOSIS — K1230 Oral mucositis (ulcerative), unspecified: Secondary | ICD-10-CM

## 2015-03-02 DIAGNOSIS — C3491 Malignant neoplasm of unspecified part of right bronchus or lung: Secondary | ICD-10-CM

## 2015-03-02 LAB — CBC WITH DIFFERENTIAL/PLATELET
BASO%: 0.5 % (ref 0.0–2.0)
Basophils Absolute: 0 10*3/uL (ref 0.0–0.1)
EOS ABS: 0.1 10*3/uL (ref 0.0–0.5)
EOS%: 2.1 % (ref 0.0–7.0)
HEMATOCRIT: 37 % (ref 34.8–46.6)
HGB: 12.5 g/dL (ref 11.6–15.9)
LYMPH#: 1.1 10*3/uL (ref 0.9–3.3)
LYMPH%: 18 % (ref 14.0–49.7)
MCH: 30.9 pg (ref 25.1–34.0)
MCHC: 33.8 g/dL (ref 31.5–36.0)
MCV: 91.4 fL (ref 79.5–101.0)
MONO#: 0.6 10*3/uL (ref 0.1–0.9)
MONO%: 10.2 % (ref 0.0–14.0)
NEUT#: 4.4 10*3/uL (ref 1.5–6.5)
NEUT%: 69.2 % (ref 38.4–76.8)
PLATELETS: 313 10*3/uL (ref 145–400)
RBC: 4.05 10*6/uL (ref 3.70–5.45)
RDW: 12.9 % (ref 11.2–14.5)
WBC: 6.3 10*3/uL (ref 3.9–10.3)

## 2015-03-02 LAB — COMPREHENSIVE METABOLIC PANEL
ALT: 10 U/L (ref 0–55)
ANION GAP: 7 meq/L (ref 3–11)
AST: 13 U/L (ref 5–34)
Albumin: 3.1 g/dL — ABNORMAL LOW (ref 3.5–5.0)
Alkaline Phosphatase: 105 U/L (ref 40–150)
BILIRUBIN TOTAL: 0.81 mg/dL (ref 0.20–1.20)
BUN: <4 mg/dL — ABNORMAL LOW (ref 7.0–26.0)
CALCIUM: 8.5 mg/dL (ref 8.4–10.4)
CHLORIDE: 106 meq/L (ref 98–109)
CO2: 28 mEq/L (ref 22–29)
CREATININE: 0.7 mg/dL (ref 0.6–1.1)
Glucose: 92 mg/dl (ref 70–140)
Potassium: 3.7 mEq/L (ref 3.5–5.1)
Sodium: 141 mEq/L (ref 136–145)
TOTAL PROTEIN: 6.4 g/dL (ref 6.4–8.3)

## 2015-03-02 MED ORDER — DENOSUMAB 120 MG/1.7ML ~~LOC~~ SOLN: 120.0000 mg | Freq: Once | SUBCUTANEOUS | Status: DC

## 2015-03-02 MED ORDER — METHYLPREDNISOLONE 4 MG PO TBPK: ORAL_TABLET | ORAL | Status: DC

## 2015-03-02 MED ORDER — MAGIC MOUTHWASH W/LIDOCAINE: 5.0000 mL | Freq: Four times a day (QID) | ORAL | Status: DC | PRN

## 2015-03-02 MED ORDER — CLINDAMYCIN PHOSPHATE 1 % EX GEL: Freq: Two times a day (BID) | CUTANEOUS | Status: DC

## 2015-03-02 MED FILL — CLINDAMYCIN PH 1% GEL: 1 | 10 days supply | Qty: 30 | Fill #0

## 2015-03-02 MED FILL — MAGIC MOUTHWASH W/LIDO 1:1: 12 days supply | Qty: 240 | Fill #0

## 2015-03-02 MED FILL — METHYLPREDNISOLONE 4 MG TAB: 4 | 6 days supply | Qty: 21 | Fill #0

## 2015-03-02 NOTE — Telephone Encounter (Signed)
Requested dental clearance for xjeva in the next 3 weeks.

## 2015-03-02 NOTE — Telephone Encounter (Signed)
per pof to sch pt appt-gave pt copy of avs °

## 2015-03-02 NOTE — Progress Notes (Signed)
[facility] Telephone:([phone]   Fax:([phone]  OFFICE PROGRESS NOTE  [name], MD [place] [address]  DIAGNOSIS: Stage IV (T2a, N1, M1b) non-small cell lung cancer, adenocarcinoma presented with right middle lobe lung mass in addition to right hilar adenopathy as well as metastatic disease to the bone, brain as well as left adrenal metastasis diagnosed in July 2016  PRIOR THERAPY:  1) palliative radiotherapy to the brain as well as metastatic bone lesions in the pelvis. 2) Tarceva 150 mg by mouth daily started on 09/25/2014. Status post 5 months of treatment  CURRENT THERAPY: Tarceva 100 mg by mouth daily started on 02/19/2015.  INTERVAL HISTORY: Deborah Foster 45 y.o. female returns to the clinic today for hospital follow-up visit accompanied by her interpreter. The patient is tolerating her current treatment with Tarceva fairly well except for grade 2 skin rash mainly on the face and upper chest. She did not get much benefit from previous treatment with doxycycline capsules. She is applying moisture cream as well as clindamycin gel to these areas. I reduced her dose of Tarceva to 100 mg by mouth daily started 02/19/2015 She denied having any significant diarrhea. She also has occasional low back pain. She continues to have dry cough. She has no significant weight loss. She denied having any significant chest pain, shortness of breath, or hemoptysis. The patient denied having any headache or visual changes. She has no significant nausea or vomiting. She has no fever or chills. She is here today for evaluation and repeat blood work. She has not received dental clearance to start treatment with Xgeva.  MEDICAL HISTORY: Past Medical History  Diagnosis Date  . Pneumonia   . Hemoptysis   . Lung mass   . Hypokalemia   . Radiation 08/23/14-09/07/14    Brain/chest and left hip 30 Gy 12 Fx  . lung ca dx'd 07/2014  . Metastasis to brain  ([place])   . Metastasis to adrenal gland ([place])   . Bone metastasis ([place])   . URI (upper respiratory infection) 02-02-2015    ALLERGIES:  is allergic to doxycycline.  MEDICATIONS:  Current Outpatient Prescriptions  Medication Sig Dispense Refill  . albuterol (PROVENTIL HFA;VENTOLIN HFA) 108 (90 BASE) MCG/ACT inhaler Inhale 2 puffs into the lungs every 6 (six) hours as needed for wheezing or shortness of breath. 1 Inhaler 2  . benzonatate (TESSALON PERLES) 100 MG capsule Take 1 capsule (100 mg total) by mouth 3 (three) times daily as needed for cough. 30 capsule 0  . clindamycin (CLINDAGEL) 1 % gel Apply topically 2 (two) times daily. 30 g 1  . erlotinib (TARCEVA) 100 MG tablet Take 1 tablet (100 mg total) by mouth daily. Take on an empty stomach 1 hour before meals or 2 hours after 30 tablet 2  . loratadine (CLARITIN) 10 MG tablet Take 1 tablet (10 mg total) by mouth daily. (Patient not taking: Reported on 02/15/2015) 20 tablet 0  . magic mouthwash w/lidocaine SOLN Take 5 mLs by mouth 4 (four) times daily as needed for mouth pain. (Patient not taking: Reported on 03/02/2015) 240 mL 0  . ondansetron (ZOFRAN) 4 MG tablet Take 1 tablet (4 mg total) by mouth every 8 (eight) hours as needed for nausea or vomiting. (Patient not taking: Reported on 01/05/2015) 30 tablet 0  . prochlorperazine (COMPAZINE) 10 MG tablet Take 1 tablet (10 mg total) by mouth every 6 (six) hours as needed for nausea or  vomiting. (Patient not taking: Reported on 03/02/2015) 30 tablet 0  . promethazine-codeine (PHENERGAN WITH CODEINE) 6.25-10 MG/5ML syrup Take 5 mLs by mouth every 4 (four) hours as needed. (Patient not taking: Reported on 02/15/2015) 120 mL 0   No current facility-administered medications for this visit.   Facility-Administered Medications Ordered in Other Visits  Medication Dose Route Frequency Provider Last Rate Last Dose  . denosumab (XGEVA) injection 120 mg  120 mg Subcutaneous Once [name], MD         SURGICAL HISTORY:  Past Surgical History  Procedure Laterality Date  . Video bronchoscopy Bilateral 08/09/2014    Procedure: VIDEO BRONCHOSCOPY WITHOUT FLUORO;  Surgeon: [name], MD;  Location: [facility];  Service: Cardiopulmonary;  Laterality: Bilateral;    REVIEW OF SYSTEMS:  Constitutional: negative Eyes: negative Ears, nose, mouth, throat, and face: negative Respiratory: positive for cough Cardiovascular: negative Gastrointestinal: negative Genitourinary:negative Integument/breast: positive for dryness and rash Hematologic/lymphatic: negative Musculoskeletal:positive for bone pain Neurological: negative Behavioral/Psych: negative Endocrine: negative Allergic/Immunologic: negative   PHYSICAL EXAMINATION: General appearance: alert, cooperative, fatigued and no distress Head: Normocephalic, without obvious abnormality, atraumatic Neck: no adenopathy, no JVD, supple, symmetrical, trachea midline and thyroid not enlarged, symmetric, no tenderness/mass/nodules Lymph nodes: Cervical, supraclavicular, and axillary nodes normal. Resp: clear to auscultation bilaterally Back: symmetric, no curvature. ROM normal. No CVA tenderness. Cardio: regular rate and rhythm, S1, S2 normal, no murmur, click, rub or gallop GI: soft, non-tender; bowel sounds normal; no masses,  no organomegaly Extremities: extremities normal, atraumatic, no cyanosis or edema Neurologic: Alert and oriented X 3, normal strength and tone. Normal symmetric reflexes. Normal coordination and gait  Skin exam showed maculopapular grade 2 skin rash on the face and upper chest.  ECOG PERFORMANCE STATUS: 1 - Symptomatic but completely ambulatory  Blood pressure 110/42, pulse 88, temperature 97.8 F (36.6 C), temperature source Oral, resp. rate 18, height 5' (1.524 m), weight 103 lb 12.8 oz (47.083 kg), SpO2 97 %.  LABORATORY DATA: Lab Results  Component Value Date   WBC 6.3 03/02/2015   HGB 12.5  03/02/2015   HCT 37.0 03/02/2015   MCV 91.4 03/02/2015   PLT 313 03/02/2015      Chemistry      Component Value Date/Time   NA 141 03/02/2015 1441   NA 138 02/15/2015 2350   K 3.7 03/02/2015 1441   K 4.0 02/15/2015 2350   CL 106 02/15/2015 2350   CO2 28 03/02/2015 1441   CO2 23 02/15/2015 2350   BUN <4.0* 03/02/2015 1441   BUN 5* 02/15/2015 2350   CREATININE 0.7 03/02/2015 1441   CREATININE 0.70 02/15/2015 2350      Component Value Date/Time   CALCIUM 8.5 03/02/2015 1441   CALCIUM 8.6* 02/15/2015 2350   ALKPHOS 105 03/02/2015 1441   ALKPHOS 86 01/08/2015 0949   AST 13 03/02/2015 1441   AST 18 01/08/2015 0949   ALT 10 03/02/2015 1441   ALT 13* 01/08/2015 0949   BILITOT 0.81 03/02/2015 1441   BILITOT 0.8 01/08/2015 0949       RADIOGRAPHIC STUDIES: Dg Chest 2 View  02/16/2015  CLINICAL DATA:  Right-sided chest pain and hemoptysis tonight. Lung cancer with radiation history EXAM: CHEST  2 VIEW COMPARISON:  01/08/2015 FINDINGS: Right perihilar opacity with angular margins and volume loss has an unchanged appearance. Elsewhere, the lungs are clear. Normal heart size and stable mediastinal contours. Known osseous metastatic disease without acute osseous finding. IMPRESSION: Treated right lung cancer with unchanged appearance.  No change from 01/08/2015 to suggest acute disease. Electronically Signed   By: Monte Fantasia M.D.   On: 02/16/2015 00:22    ASSESSMENT AND PLAN: This is a very pleasant 45 years old Asian female never smoker with recently diagnosed with stage IV non-small cell lung cancer, adenocarcinoma presented with right middle lobe lung mass in addition to right hilar adenopathy and metastatic disease to the bones, brain and left adrenal gland.  She status post whole brain irradiation with some improvement in her disease on the recent MRI of the brain. The patient completed treatment with Tarceva 150 mg by mouth daily for 5 months and tolerating it fairly well except  for grade 2 skin rash and recent episodes of diarrhea. She is currently on Tarceva 100 mg by mouth daily started 02/19/2015. For the skin rash, I gave the patient refill of clindamycin gel and also started the patient on Medrol Dosepak. For the metastatic bone lesions, I would consider the patient for treatment with Xgeva after receiving dental clearance.  She was also given a refill for Magic mouthwash. She will come back for follow-up visit in one month for reevaluation with repeat blood work as well as CT scan of the chest, abdomen and pelvis for restaging of her disease. She was advised to call immediately if she has any concerning symptoms in the interval. The patient voices understanding of current disease status and treatment options and is in agreement with the current care plan.  All questions were answered. The patient knows to call the clinic with any problems, questions or concerns. We can certainly see the patient much sooner if necessary.  Disclaimer: This note was dictated with voice recognition software. Similar sounding words can inadvertently be transcribed and may not be corrected upon review.

## 2015-03-02 NOTE — Telephone Encounter (Signed)
per pof to sch pt appt-gave pt copy of avs-adv [facility] all to sch scan

## 2015-03-08 ENCOUNTER — Telehealth: Payer: Self-pay | Admitting: Medical Oncology

## 2015-03-08 ENCOUNTER — Other Ambulatory Visit: Payer: Self-pay | Admitting: Medical Oncology

## 2015-03-08 NOTE — Telephone Encounter (Signed)
Deborah Foster stated pt needs dental extraction and was sent to oral surgeon. I gave her xgeva hx.-last dose 03/02/15.

## 2015-03-11 ENCOUNTER — Telehealth: Payer: Self-pay | Admitting: Internal Medicine

## 2015-03-11 NOTE — Telephone Encounter (Signed)
Left message to inform pt of injection appt added to ov appt 3/16

## 2015-03-28 ENCOUNTER — Other Ambulatory Visit (HOSPITAL_BASED_OUTPATIENT_CLINIC_OR_DEPARTMENT_OTHER): Payer: Medicaid Other

## 2015-03-28 ENCOUNTER — Ambulatory Visit (HOSPITAL_COMMUNITY)
Admission: RE | Admit: 2015-03-28 | Discharge: 2015-03-28 | Disposition: A | Discharge status: Home / Self Care | Payer: Medicaid Other | Source: Ambulatory Visit | Attending: Internal Medicine | Admitting: Internal Medicine

## 2015-03-28 ENCOUNTER — Encounter (HOSPITAL_COMMUNITY): Payer: Self-pay

## 2015-03-28 DIAGNOSIS — K838 Other specified diseases of biliary tract: Secondary | ICD-10-CM | POA: Diagnosis not present

## 2015-03-28 DIAGNOSIS — C7931 Secondary malignant neoplasm of brain: Secondary | ICD-10-CM

## 2015-03-28 DIAGNOSIS — C7951 Secondary malignant neoplasm of bone: Secondary | ICD-10-CM | POA: Diagnosis not present

## 2015-03-28 DIAGNOSIS — C342 Malignant neoplasm of middle lobe, bronchus or lung: Secondary | ICD-10-CM

## 2015-03-28 DIAGNOSIS — C3491 Malignant neoplasm of unspecified part of right bronchus or lung: Secondary | ICD-10-CM | POA: Diagnosis present

## 2015-03-28 LAB — CBC WITH DIFFERENTIAL/PLATELET
BASO%: 0.7 % (ref 0.0–2.0)
BASOS ABS: 0.1 10*3/uL (ref 0.0–0.1)
EOS ABS: 0.2 10*3/uL (ref 0.0–0.5)
EOS%: 2.5 % (ref 0.0–7.0)
HEMATOCRIT: 43.8 % (ref 34.8–46.6)
HEMOGLOBIN: 14.6 g/dL (ref 11.6–15.9)
LYMPH#: 1.2 10*3/uL (ref 0.9–3.3)
LYMPH%: 17.4 % (ref 14.0–49.7)
MCH: 30.6 pg (ref 25.1–34.0)
MCHC: 33.3 g/dL (ref 31.5–36.0)
MCV: 91.9 fL (ref 79.5–101.0)
MONO#: 0.6 10*3/uL (ref 0.1–0.9)
MONO%: 8.1 % (ref 0.0–14.0)
NEUT#: 5 10*3/uL (ref 1.5–6.5)
NEUT%: 71.3 % (ref 38.4–76.8)
PLATELETS: 422 10*3/uL — AB (ref 145–400)
RBC: 4.77 10*6/uL (ref 3.70–5.45)
RDW: 13.6 % (ref 11.2–14.5)
WBC: 7.1 10*3/uL (ref 3.9–10.3)

## 2015-03-28 LAB — COMPREHENSIVE METABOLIC PANEL
ALBUMIN: 3.4 g/dL — AB (ref 3.5–5.0)
ALK PHOS: 94 U/L (ref 40–150)
ALT: 11 U/L (ref 0–55)
ANION GAP: 7 meq/L (ref 3–11)
AST: 13 U/L (ref 5–34)
BUN: 5.1 mg/dL — AB (ref 7.0–26.0)
CALCIUM: 9 mg/dL (ref 8.4–10.4)
CHLORIDE: 105 meq/L (ref 98–109)
CO2: 26 mEq/L (ref 22–29)
Creatinine: 0.7 mg/dL (ref 0.6–1.1)
Glucose: 86 mg/dl (ref 70–140)
POTASSIUM: 4.1 meq/L (ref 3.5–5.1)
Sodium: 138 mEq/L (ref 136–145)
Total Bilirubin: 0.7 mg/dL (ref 0.20–1.20)
Total Protein: 6.6 g/dL (ref 6.4–8.3)

## 2015-03-28 MED ORDER — IOHEXOL 300 MG/ML  SOLN
50.0000 mL | Freq: Once | INTRAMUSCULAR | Status: DC | PRN
  Administered 2015-03-28: 50 mL via ORAL
  Filled 2015-03-28: qty 50

## 2015-03-28 MED ORDER — IOHEXOL 300 MG/ML  SOLN
100.0000 mL | Freq: Once | INTRAMUSCULAR | Status: AC | PRN
  Administered 2015-03-28: 80 mL via INTRAVENOUS

## 2015-03-31 ENCOUNTER — Telehealth: Payer: Self-pay | Admitting: Internal Medicine

## 2015-03-31 ENCOUNTER — Ambulatory Visit (HOSPITAL_BASED_OUTPATIENT_CLINIC_OR_DEPARTMENT_OTHER): Payer: Medicaid Other

## 2015-03-31 ENCOUNTER — Encounter: Payer: Self-pay | Admitting: Internal Medicine

## 2015-03-31 ENCOUNTER — Ambulatory Visit (HOSPITAL_BASED_OUTPATIENT_CLINIC_OR_DEPARTMENT_OTHER): Payer: Medicaid Other | Admitting: Internal Medicine

## 2015-03-31 VITALS — BP 110/48 | HR 98 | Temp 98.2°F | Resp 17 | Ht 60.0 in | Wt 98.8 lb

## 2015-03-31 DIAGNOSIS — K1230 Oral mucositis (ulcerative), unspecified: Secondary | ICD-10-CM | POA: Diagnosis not present

## 2015-03-31 DIAGNOSIS — C7931 Secondary malignant neoplasm of brain: Secondary | ICD-10-CM | POA: Diagnosis not present

## 2015-03-31 DIAGNOSIS — C342 Malignant neoplasm of middle lobe, bronchus or lung: Secondary | ICD-10-CM

## 2015-03-31 DIAGNOSIS — M898X9 Other specified disorders of bone, unspecified site: Secondary | ICD-10-CM

## 2015-03-31 DIAGNOSIS — C7951 Secondary malignant neoplasm of bone: Secondary | ICD-10-CM

## 2015-03-31 DIAGNOSIS — C3491 Malignant neoplasm of unspecified part of right bronchus or lung: Secondary | ICD-10-CM

## 2015-03-31 DIAGNOSIS — C7972 Secondary malignant neoplasm of left adrenal gland: Secondary | ICD-10-CM | POA: Diagnosis not present

## 2015-03-31 DIAGNOSIS — M9080 Osteopathy in diseases classified elsewhere, unspecified site: Principal | ICD-10-CM

## 2015-03-31 DIAGNOSIS — R21 Rash and other nonspecific skin eruption: Secondary | ICD-10-CM | POA: Diagnosis not present

## 2015-03-31 DIAGNOSIS — Z5111 Encounter for antineoplastic chemotherapy: Secondary | ICD-10-CM

## 2015-03-31 DIAGNOSIS — E889 Metabolic disorder, unspecified: Secondary | ICD-10-CM

## 2015-03-31 MED ORDER — HYDROCODONE BITARTRATE ER 20 MG PO CP12: 1.0000 | ORAL_CAPSULE | Freq: Two times a day (BID) | ORAL | Status: DC

## 2015-03-31 MED ORDER — METHYLPREDNISOLONE 4 MG PO TBPK: ORAL_TABLET | ORAL | Status: DC

## 2015-03-31 MED ORDER — DENOSUMAB 120 MG/1.7ML ~~LOC~~ SOLN
120.0000 mg | Freq: Once | SUBCUTANEOUS | Status: AC
Start: 2015-03-31 — End: 2015-03-31
  Administered 2015-03-31: 120 mg via SUBCUTANEOUS
  Filled 2015-03-31: qty 1.7

## 2015-03-31 MED ORDER — MAGIC MOUTHWASH W/LIDOCAINE: 5.0000 mL | Freq: Four times a day (QID) | ORAL | Status: DC | PRN

## 2015-03-31 MED FILL — METHYLPREDNISOLONE 4 MG TAB: 4 | 6 days supply | Qty: 21 | Fill #0

## 2015-03-31 MED FILL — MAGIC MOUTHWASH W/LIDO 1:1: 12 days supply | Qty: 240 | Fill #0

## 2015-03-31 NOTE — Progress Notes (Signed)
Instructed patient to start taking a calcium/vit D supplement once daily, per interpurtor.

## 2015-03-31 NOTE — Telephone Encounter (Signed)
Handicap sticker application mailed to pt.

## 2015-03-31 NOTE — Progress Notes (Signed)
[facility] [phone]   [phone]  OFFICE PROGRESS NOTE  [name], MD [place] [address]  DIAGNOSIS: Stage IV (T2a, N1, M1b) non-small cell lung cancer, adenocarcinoma presented with right middle lobe lung mass in addition to right hilar adenopathy as well as metastatic disease to the bone, brain as well as left adrenal metastasis diagnosed in July 2016  PRIOR THERAPY:  1) palliative radiotherapy to the brain as well as metastatic bone lesions in the pelvis. 2) Tarceva 150 mg by mouth daily started on 09/25/2014. Status post 5 months of treatment  CURRENT THERAPY:  1) Tarceva 100 mg by mouth daily started on 02/19/2015. Status post 6 weeks of treatment. 2) Xgeva 120 g subcutaneously monthly  INTERVAL HISTORY: Deborah Foster 45 y.o. female returns to the clinic today for hospital follow-up visit accompanied by her interpreter. The patient is tolerating her current treatment with Tarceva fairly well except for grade 1 skin rash mainly on the face and upper chest. She continues to have cough with occasional hemoptysis. She denied having any significant chest pain, or shortness of breath. She lost few pounds recently as her family were suffering from flu symptoms and she was taking care of her 3 kids. She was started on Tamiflu. She denied having any nausea or vomiting. She has occasional diarrhea. The patient had repeat CT scan of the chest, abdomen pelvis performed recently and she is here for evaluation and discussion of her scan results.  MEDICAL HISTORY: Past Medical History  Diagnosis Date  . Pneumonia   . Hemoptysis   . Lung mass   . Hypokalemia   . Radiation 08/23/14-09/07/14    Brain/chest and left hip 30 Gy 12 Fx  . lung ca dx'd 07/2014  . Metastasis to brain ([place])   . Metastasis to adrenal gland ([place])   . Bone metastasis ([place])   . URI (upper respiratory infection) 02-02-2015    ALLERGIES:  is  allergic to doxycycline.  MEDICATIONS:  Current Outpatient Prescriptions  Medication Sig Dispense Refill  . albuterol (PROVENTIL HFA;VENTOLIN HFA) 108 (90 BASE) MCG/ACT inhaler Inhale 2 puffs into the lungs every 6 (six) hours as needed for wheezing or shortness of breath. 1 Inhaler 2  . clindamycin (CLINDAGEL) 1 % gel Apply topically 2 (two) times daily. 30 g 1  . erlotinib (TARCEVA) 100 MG tablet Take 1 tablet (100 mg total) by mouth daily. Take on an empty stomach 1 hour before meals or 2 hours after 30 tablet 2  . HYDROcodone Bitartrate ER 20 MG CP12 Take 1 capsule by mouth 2 (two) times daily.    . magic mouthwash w/lidocaine SOLN Take 5 mLs by mouth 4 (four) times daily as needed for mouth pain. 240 mL 0  . oseltamivir (TAMIFLU) 30 MG capsule Take 30 mg by mouth daily.    Marland Kitchen loratadine (CLARITIN) 10 MG tablet Take 1 tablet (10 mg total) by mouth daily. (Patient not taking: Reported on 02/15/2015) 20 tablet 0  . ondansetron (ZOFRAN) 4 MG tablet Take 1 tablet (4 mg total) by mouth every 8 (eight) hours as needed for nausea or vomiting. (Patient not taking: Reported on 01/05/2015) 30 tablet 0  . prochlorperazine (COMPAZINE) 10 MG tablet Take 1 tablet (10 mg total) by mouth every 6 (six) hours as needed for nausea or vomiting. (Patient not taking: Reported on 03/02/2015) 30 tablet 0   No current facility-administered medications for this visit.  SURGICAL HISTORY:  Past Surgical History  Procedure Laterality Date  . Video bronchoscopy Bilateral 08/09/2014    Procedure: VIDEO BRONCHOSCOPY WITHOUT FLUORO;  Surgeon: [name], MD;  Location: [facility];  Service: Cardiopulmonary;  Laterality: Bilateral;    REVIEW OF SYSTEMS:  Constitutional: positive for fatigue and weight loss Eyes: negative Ears, nose, mouth, throat, and face: negative Respiratory: positive for cough and hemoptysis Cardiovascular: negative Gastrointestinal: negative Genitourinary:negative Integument/breast:  positive for dryness and rash Hematologic/lymphatic: negative Musculoskeletal:positive for bone pain Neurological: negative Behavioral/Psych: negative Endocrine: negative Allergic/Immunologic: negative   PHYSICAL EXAMINATION: General appearance: alert, cooperative, fatigued and no distress Head: Normocephalic, without obvious abnormality, atraumatic Neck: no adenopathy, no JVD, supple, symmetrical, trachea midline and thyroid not enlarged, symmetric, no tenderness/mass/nodules Lymph nodes: Cervical, supraclavicular, and axillary nodes normal. Resp: clear to auscultation bilaterally Back: symmetric, no curvature. ROM normal. No CVA tenderness. Cardio: regular rate and rhythm, S1, S2 normal, no murmur, click, rub or gallop GI: soft, non-tender; bowel sounds normal; no masses,  no organomegaly Extremities: extremities normal, atraumatic, no cyanosis or edema Neurologic: Alert and oriented X 3, normal strength and tone. Normal symmetric reflexes. Normal coordination and gait  Skin exam showed maculopapular grade 2 skin rash on the face and upper chest.  ECOG PERFORMANCE STATUS: 1 - Symptomatic but completely ambulatory  Blood pressure 110/48, pulse 98, temperature 98.2 F (36.8 C), temperature source Oral, resp. rate 17, height 5' (1.524 m), weight 98 lb 12.8 oz (44.815 kg), SpO2 100 %.  LABORATORY DATA: Lab Results  Component Value Date   WBC 7.1 03/28/2015   HGB 14.6 03/28/2015   HCT 43.8 03/28/2015   MCV 91.9 03/28/2015   PLT 422* 03/28/2015      Chemistry      Component Value Date/Time   NA 138 03/28/2015 0739   NA 138 02/15/2015 2350   K 4.1 03/28/2015 0739   K 4.0 02/15/2015 2350   CL 106 02/15/2015 2350   CO2 26 03/28/2015 0739   CO2 23 02/15/2015 2350   BUN 5.1* 03/28/2015 0739   BUN 5* 02/15/2015 2350   CREATININE 0.7 03/28/2015 0739   CREATININE 0.70 02/15/2015 2350      Component Value Date/Time   CALCIUM 9.0 03/28/2015 0739   CALCIUM 8.6* 02/15/2015 2350    ALKPHOS 94 03/28/2015 0739   ALKPHOS 86 01/08/2015 0949   AST 13 03/28/2015 0739   AST 18 01/08/2015 0949   ALT 11 03/28/2015 0739   ALT 13* 01/08/2015 0949   BILITOT 0.70 03/28/2015 0739   BILITOT 0.8 01/08/2015 0949       RADIOGRAPHIC STUDIES: Ct Chest W Contrast  03/28/2015  CLINICAL DATA:  Stage IV lung cancer, brain metastases. Ongoing chemotherapy. Cough since September. Right flank pain and belly pain for 1 month. EXAM: CT CHEST, ABDOMEN, AND PELVIS WITH CONTRAST TECHNIQUE: Multidetector CT imaging of the chest, abdomen and pelvis was performed following the standard protocol during bolus administration of intravenous contrast. CONTRAST:  54m OMNIPAQUE IOHEXOL 300 MG/ML  SOLN COMPARISON:  CT chest 01/08/2015, CT abdomen pelvis 11/24/2014 and PET 09/09/2014. FINDINGS: CT CHEST FINDINGS Mediastinum/Lymph Nodes: Triangular-shaped soft tissue in the prevascular space has fatty stippling, indicative of thymic rebound. No pathologically enlarged mediastinal, hilar or axillary lymph nodes. Heart size normal. No pericardial effusion. Lungs/Pleura: Ground-glass, bronchiectasis, consolidation and volume loss in the right perihilar region appear more organized and contracted than on 01/08/2015, indicative of radiation therapy. Left lung is clear. No pleural fluid. Airway is unremarkable. Musculoskeletal: Scattered  sclerotic lesions are unchanged. CT ABDOMEN PELVIS FINDINGS Hepatobiliary: Liver and gallbladder are unremarkable. Intrahepatic and extrahepatic biliary ductal dilatation are unchanged. Pancreas: Negative. Spleen: Negative. Adrenals/Urinary Tract: Adrenal glands and kidneys are unremarkable. Ureters are decompressed. Bladder is unremarkable. Stomach/Bowel: Stomach, small bowel, appendix and colon are unremarkable. Vascular/Lymphatic: Vascular structures are unremarkable. No pathologically enlarged lymph nodes. Reproductive: Uterus and ovaries are visualized. 5.1 cm low-attenuation lesion in the  right adnexa is new from 11/24/2014 and likely a physiologic cyst. Probable nabothian cysts in the cervical region. Other: No free fluid.  Mesenteries and peritoneum are unremarkable. Musculoskeletal: Stable osseous metastatic disease. Index mixed lytic and sclerotic lesion in the left femoral neck measures 1.7 cm (series 2, image 105), stable. IMPRESSION: 1. Evolutionary changes of radiation therapy in the perihilar right hemi thorax. No evidence of metastatic disease in the chest, abdomen or pelvis. Stable osseous metastatic disease. 2. Intrahepatic and extrahepatic biliary ductal dilatation, stable from recent prior exams. No additional findings to explain the patient's abdominal symptoms. Electronically Signed   By: Lorin Picket M.D.   On: 03/28/2015 10:28   Ct Abdomen Pelvis W Contrast  03/28/2015  CLINICAL DATA:  Stage IV lung cancer, brain metastases. Ongoing chemotherapy. Cough since September. Right flank pain and belly pain for 1 month. EXAM: CT CHEST, ABDOMEN, AND PELVIS WITH CONTRAST TECHNIQUE: Multidetector CT imaging of the chest, abdomen and pelvis was performed following the standard protocol during bolus administration of intravenous contrast. CONTRAST:  65m OMNIPAQUE IOHEXOL 300 MG/ML  SOLN COMPARISON:  CT chest 01/08/2015, CT abdomen pelvis 11/24/2014 and PET 09/09/2014. FINDINGS: CT CHEST FINDINGS Mediastinum/Lymph Nodes: Triangular-shaped soft tissue in the prevascular space has fatty stippling, indicative of thymic rebound. No pathologically enlarged mediastinal, hilar or axillary lymph nodes. Heart size normal. No pericardial effusion. Lungs/Pleura: Ground-glass, bronchiectasis, consolidation and volume loss in the right perihilar region appear more organized and contracted than on 01/08/2015, indicative of radiation therapy. Left lung is clear. No pleural fluid. Airway is unremarkable. Musculoskeletal: Scattered sclerotic lesions are unchanged. CT ABDOMEN PELVIS FINDINGS Hepatobiliary:  Liver and gallbladder are unremarkable. Intrahepatic and extrahepatic biliary ductal dilatation are unchanged. Pancreas: Negative. Spleen: Negative. Adrenals/Urinary Tract: Adrenal glands and kidneys are unremarkable. Ureters are decompressed. Bladder is unremarkable. Stomach/Bowel: Stomach, small bowel, appendix and colon are unremarkable. Vascular/Lymphatic: Vascular structures are unremarkable. No pathologically enlarged lymph nodes. Reproductive: Uterus and ovaries are visualized. 5.1 cm low-attenuation lesion in the right adnexa is new from 11/24/2014 and likely a physiologic cyst. Probable nabothian cysts in the cervical region. Other: No free fluid.  Mesenteries and peritoneum are unremarkable. Musculoskeletal: Stable osseous metastatic disease. Index mixed lytic and sclerotic lesion in the left femoral neck measures 1.7 cm (series 2, image 105), stable. IMPRESSION: 1. Evolutionary changes of radiation therapy in the perihilar right hemi thorax. No evidence of metastatic disease in the chest, abdomen or pelvis. Stable osseous metastatic disease. 2. Intrahepatic and extrahepatic biliary ductal dilatation, stable from recent prior exams. No additional findings to explain the patient's abdominal symptoms. Electronically Signed   By: MLorin PicketM.D.   On: 03/28/2015 10:28    ASSESSMENT AND PLAN: This is a very pleasant 45years old Asian female never smoker with recently diagnosed with stage IV non-small cell lung cancer, adenocarcinoma presented with right middle lobe lung mass in addition to right hilar adenopathy and metastatic disease to the bones, brain and left adrenal gland.  She status post whole brain irradiation with some improvement in her disease on the recent MRI of  the brain. The patient completed treatment with Tarceva 150 mg by mouth daily for 5 months and tolerating it fairly well except for grade 2 skin rash and recent episodes of diarrhea. She is currently on Tarceva 100 mg by mouth  daily started 02/19/2015. She is tolerating the lower dose of Tarceva much better. Her recent CT scan of the chest, abdomen and pelvis showed no concerning findings for disease progression but the patient has extensive radiation changes in the right perihilar area. I discussed the scan results and showed the images to the patient today. I recommended for her to continue her current treatment with Tarceva 100 mg by mouth daily. For the skin rash, she will continue on clindamycin lotion and I also gave her refill of Medrol Dosepak.  For the metastatic bone lesions, she started treatment with Xgeva after receiving dental clearance.  She was also given a refill for Magic mouthwash. For the dry cough and occasional hemoptysis, this is most likely secondary to the extensive radiation changes in the right perihilar area. I gave the patient refill of her cough medication. She will come back for follow-up visit in one month for reevaluation with repeat blood work. She was advised to call immediately if she has any concerning symptoms in the interval. The patient voices understanding of current disease status and treatment options and is in agreement with the current care plan.  All questions were answered. The patient knows to call the clinic with any problems, questions or concerns. We can certainly see the patient much sooner if necessary.  Disclaimer: This note was dictated with voice recognition software. Similar sounding words can inadvertently be transcribed and may not be corrected upon review.

## 2015-03-31 NOTE — Telephone Encounter (Signed)
Gave and printed appt sched and avs for pt for April °

## 2015-04-01 ENCOUNTER — Encounter: Payer: Self-pay | Admitting: Medical Oncology

## 2015-04-01 NOTE — Progress Notes (Signed)
Prior auth for [employer] put in [facility] box.

## 2015-04-04 ENCOUNTER — Encounter: Payer: Self-pay | Admitting: Internal Medicine

## 2015-04-04 NOTE — Progress Notes (Signed)
Faxed nctraks form [phone] and noted sharepoint

## 2015-04-05 MED FILL — HYSINGLA ER 20 MG TABLET: 20 | 30 days supply | Qty: 60 | Fill #0

## 2015-04-27 ENCOUNTER — Other Ambulatory Visit: Payer: Self-pay | Admitting: *Deleted

## 2015-04-27 DIAGNOSIS — C3491 Malignant neoplasm of unspecified part of right bronchus or lung: Secondary | ICD-10-CM

## 2015-04-27 MED ORDER — ERLOTINIB HCL 100 MG PO TABS: 100.0000 mg | ORAL_TABLET | Freq: Every day | ORAL | Status: DC

## 2015-04-27 NOTE — Telephone Encounter (Signed)
Rx for tarceva faxed to [facility] [phone]

## 2015-04-28 ENCOUNTER — Other Ambulatory Visit: Payer: Self-pay | Admitting: *Deleted

## 2015-04-28 ENCOUNTER — Encounter: Payer: Self-pay | Admitting: Nurse Practitioner

## 2015-04-28 ENCOUNTER — Ambulatory Visit (HOSPITAL_BASED_OUTPATIENT_CLINIC_OR_DEPARTMENT_OTHER): Payer: Medicaid Other

## 2015-04-28 ENCOUNTER — Ambulatory Visit (HOSPITAL_BASED_OUTPATIENT_CLINIC_OR_DEPARTMENT_OTHER): Payer: Medicaid Other | Admitting: Nurse Practitioner

## 2015-04-28 ENCOUNTER — Other Ambulatory Visit (HOSPITAL_BASED_OUTPATIENT_CLINIC_OR_DEPARTMENT_OTHER): Payer: Medicaid Other

## 2015-04-28 ENCOUNTER — Telehealth: Payer: Self-pay | Admitting: Nurse Practitioner

## 2015-04-28 VITALS — BP 111/49 | HR 93 | Temp 97.5°F | Resp 18 | Ht 60.0 in | Wt 99.1 lb

## 2015-04-28 DIAGNOSIS — Z5111 Encounter for antineoplastic chemotherapy: Secondary | ICD-10-CM

## 2015-04-28 DIAGNOSIS — C3491 Malignant neoplasm of unspecified part of right bronchus or lung: Secondary | ICD-10-CM

## 2015-04-28 DIAGNOSIS — C7931 Secondary malignant neoplasm of brain: Secondary | ICD-10-CM

## 2015-04-28 DIAGNOSIS — C342 Malignant neoplasm of middle lobe, bronchus or lung: Secondary | ICD-10-CM

## 2015-04-28 DIAGNOSIS — C7951 Secondary malignant neoplasm of bone: Secondary | ICD-10-CM

## 2015-04-28 DIAGNOSIS — L270 Generalized skin eruption due to drugs and medicaments taken internally: Secondary | ICD-10-CM | POA: Diagnosis not present

## 2015-04-28 DIAGNOSIS — E889 Metabolic disorder, unspecified: Secondary | ICD-10-CM

## 2015-04-28 DIAGNOSIS — K1230 Oral mucositis (ulcerative), unspecified: Secondary | ICD-10-CM

## 2015-04-28 DIAGNOSIS — M9080 Osteopathy in diseases classified elsewhere, unspecified site: Principal | ICD-10-CM

## 2015-04-28 DIAGNOSIS — M898X9 Other specified disorders of bone, unspecified site: Secondary | ICD-10-CM

## 2015-04-28 DIAGNOSIS — R21 Rash and other nonspecific skin eruption: Secondary | ICD-10-CM

## 2015-04-28 LAB — CBC WITH DIFFERENTIAL/PLATELET
BASO%: 0.5 % (ref 0.0–2.0)
BASOS ABS: 0 10*3/uL (ref 0.0–0.1)
EOS%: 2.2 % (ref 0.0–7.0)
Eosinophils Absolute: 0.1 10*3/uL (ref 0.0–0.5)
HCT: 41.3 % (ref 34.8–46.6)
HEMOGLOBIN: 13.4 g/dL (ref 11.6–15.9)
LYMPH#: 1.3 10*3/uL (ref 0.9–3.3)
LYMPH%: 20.1 % (ref 14.0–49.7)
MCH: 29.6 pg (ref 25.1–34.0)
MCHC: 32.3 g/dL (ref 31.5–36.0)
MCV: 91.5 fL (ref 79.5–101.0)
MONO#: 0.6 10*3/uL (ref 0.1–0.9)
MONO%: 9.4 % (ref 0.0–14.0)
NEUT#: 4.3 10*3/uL (ref 1.5–6.5)
NEUT%: 67.8 % (ref 38.4–76.8)
Platelets: 401 10*3/uL — ABNORMAL HIGH (ref 145–400)
RBC: 4.52 10*6/uL (ref 3.70–5.45)
RDW: 14.3 % (ref 11.2–14.5)
WBC: 6.4 10*3/uL (ref 3.9–10.3)

## 2015-04-28 LAB — COMPREHENSIVE METABOLIC PANEL
ALT: 13 U/L (ref 0–55)
AST: 16 U/L (ref 5–34)
Albumin: 3.3 g/dL — ABNORMAL LOW (ref 3.5–5.0)
Alkaline Phosphatase: 85 U/L (ref 40–150)
Anion Gap: 7 mEq/L (ref 3–11)
BUN: 5.7 mg/dL — AB (ref 7.0–26.0)
CHLORIDE: 108 meq/L (ref 98–109)
CO2: 24 mEq/L (ref 22–29)
CREATININE: 0.6 mg/dL (ref 0.6–1.1)
Calcium: 8.8 mg/dL (ref 8.4–10.4)
EGFR: >90 mL/min/{1.73_m2} (ref >90)
GLUCOSE: 83 mg/dL (ref 70–140)
POTASSIUM: 3.8 meq/L (ref 3.5–5.1)
SODIUM: 140 meq/L (ref 136–145)
Total Bilirubin: 0.7 mg/dL (ref 0.20–1.20)
Total Protein: 6.5 g/dL (ref 6.4–8.3)

## 2015-04-28 MED ORDER — ERLOTINIB HCL 100 MG PO TABS: 100.0000 mg | ORAL_TABLET | Freq: Every day | ORAL | Status: DC

## 2015-04-28 MED ORDER — DENOSUMAB 120 MG/1.7ML ~~LOC~~ SOLN
120.0000 mg | Freq: Once | SUBCUTANEOUS | Status: AC
  Administered 2015-04-28: 120 mg via SUBCUTANEOUS
  Filled 2015-04-28: qty 1.7

## 2015-04-28 NOTE — Progress Notes (Signed)
[facility] [phone]   [phone]  OFFICE PROGRESS NOTE  [name], MD [place] [address]  DIAGNOSIS: Stage IV (T2a, N1, M1b) non-small cell lung cancer, adenocarcinoma presented with right middle lobe lung mass in addition to right hilar adenopathy as well as metastatic disease to the bone, brain as well as left adrenal metastasis diagnosed in July 2016  PRIOR THERAPY:  1) palliative radiotherapy to the brain as well as metastatic bone lesions in the pelvis. 2) Tarceva 150 mg by mouth daily started on 09/25/2014. Status post 5 months of treatment  CURRENT THERAPY:  1) Tarceva 100 mg by mouth daily started on 02/19/2015. Status post 2 months of treatment. 2) Xgeva 120 g subcutaneously monthly  INTERVAL HISTORY: Deborah Foster 45 y.o. female returns to the clinic today for her monthly follow-up visit accompanied by her interpreter. She is doing well on Tarceva except for a grade 1 skin rash to her face and chest. It is improved since she has started using the clindagel. This month she has had little to no diarrhea. She continue to cough, and is using a codeine based syrup which is helpful. She has occasion hemoptysis. She denies chest pain or shortness of breath. She is eating several small meals daily and is using a protein supplement to maintain her weight. She is occasionally nauseous but zofran keeps this in check. Her energy level is good. Her pain is to her bilateral shoulders, lower back, and lower legs. She is prescribed extended release hydrocodone '20mg'$  BID.  MEDICAL HISTORY: Past Medical History  Diagnosis Date  . Pneumonia   . Hemoptysis   . Lung mass   . Hypokalemia   . Radiation 08/23/14-09/07/14    Brain/chest and left hip 30 Gy 12 Fx  . lung ca dx'd 07/2014  . Metastasis to brain ([place])   . Metastasis to adrenal gland ([place])   . Bone metastasis ([place])   . URI (upper respiratory infection) 02-02-2015      ALLERGIES:  is allergic to doxycycline.  MEDICATIONS:  Current Outpatient Prescriptions  Medication Sig Dispense Refill  . Calcium Carbonate-Vitamin D3 (CALCIUM 600+D3) 600-400 MG-UNIT TABS Take 1 tablet by mouth 2 (two) times daily.    . clindamycin (CLINDAGEL) 1 % gel Apply topically 2 (two) times daily. 30 g 1  . fluticasone (FLONASE) 50 MCG/ACT nasal spray Place 1 spray into both nostrils daily.    Marland Kitchen guaiFENesin-codeine (CHERATUSSIN AC) 100-10 MG/5ML syrup Take 5 mLs by mouth 3 (three) times daily as needed for cough.    Marland Kitchen HYDROcodone Bitartrate ER 20 MG CP12 Take 1 capsule by mouth 2 (two) times daily. 60 capsule 0  . mometasone-formoterol (DULERA) 200-5 MCG/ACT AERO Inhale 2 puffs into the lungs 2 (two) times daily.    Marland Kitchen erlotinib (TARCEVA) 100 MG tablet Take 1 tablet (100 mg total) by mouth daily. Take on an empty stomach 1 hour before meals or 2 hours after 30 tablet 2  . loratadine (CLARITIN) 10 MG tablet Take 1 tablet (10 mg total) by mouth daily. (Patient not taking: Reported on 02/15/2015) 20 tablet 0  . magic mouthwash w/lidocaine SOLN Take 5 mLs by mouth 4 (four) times daily as needed for mouth pain. (Patient not taking: Reported on 04/28/2015) 240 mL 0  . ondansetron (ZOFRAN) 4 MG tablet Take 1 tablet (4 mg total) by mouth every 8 (eight) hours as needed for nausea or vomiting. (Patient not taking: Reported  on 01/05/2015) 30 tablet 0  . prochlorperazine (COMPAZINE) 10 MG tablet Take 1 tablet (10 mg total) by mouth every 6 (six) hours as needed for nausea or vomiting. (Patient not taking: Reported on 03/02/2015) 30 tablet 0   No current facility-administered medications for this visit.   Facility-Administered Medications Ordered in Other Visits  Medication Dose Route Frequency Provider Last Rate Last Dose  . denosumab (XGEVA) injection 120 mg  120 mg Subcutaneous Once [name], MD        SURGICAL HISTORY:  Past Surgical History  Procedure Laterality Date  . Video  bronchoscopy Bilateral 08/09/2014    Procedure: VIDEO BRONCHOSCOPY WITHOUT FLUORO;  Surgeon: [name], MD;  Location: [facility];  Service: Cardiopulmonary;  Laterality: Bilateral;    REVIEW OF SYSTEMS:  Constitutional: positive for fatigue and weight loss Eyes: negative Ears, nose, mouth, throat, and face: negative Respiratory: positive for cough and hemoptysis Cardiovascular: negative Gastrointestinal: negative Genitourinary:negative Integument/breast: positive for dryness and rash Hematologic/lymphatic: negative Musculoskeletal:positive for bone pain Neurological: negative Behavioral/Psych: negative Endocrine: negative Allergic/Immunologic: negative   PHYSICAL EXAMINATION: General appearance: alert, cooperative, fatigued and no distress Head: Normocephalic, without obvious abnormality, atraumatic Neck: no adenopathy, no JVD, supple, symmetrical, trachea midline and thyroid not enlarged, symmetric, no tenderness/mass/nodules Lymph nodes: Cervical, supraclavicular, and axillary nodes normal. Resp: clear to auscultation bilaterally Back: symmetric, no curvature. ROM normal. No CVA tenderness. Cardio: regular rate and rhythm, S1, S2 normal, no murmur, click, rub or gallop GI: soft, non-tender; bowel sounds normal; no masses,  no organomegaly Extremities: extremities normal, atraumatic, no cyanosis or edema Neurologic: Alert and oriented X 3, normal strength and tone. Normal symmetric reflexes. Normal coordination and gait  Skin exam showed maculopapular grade 2 skin rash on the face and upper chest.  ECOG PERFORMANCE STATUS: 1 - Symptomatic but completely ambulatory  Blood pressure 111/49, pulse 93, temperature 97.5 F (36.4 C), temperature source Oral, resp. rate 18, height 5' (1.524 m), weight 99 lb 1.6 oz (44.951 kg), SpO2 100 %.  LABORATORY DATA: Lab Results  Component Value Date   WBC 6.4 04/28/2015   HGB 13.4 04/28/2015   HCT 41.3 04/28/2015   MCV 91.5  04/28/2015   PLT 401* 04/28/2015      Chemistry      Component Value Date/Time   NA 140 04/28/2015 1101   NA 138 02/15/2015 2350   K 3.8 04/28/2015 1101   K 4.0 02/15/2015 2350   CL 106 02/15/2015 2350   CO2 24 04/28/2015 1101   CO2 23 02/15/2015 2350   BUN 5.7* 04/28/2015 1101   BUN 5* 02/15/2015 2350   CREATININE 0.6 04/28/2015 1101   CREATININE 0.70 02/15/2015 2350      Component Value Date/Time   CALCIUM 8.8 04/28/2015 1101   CALCIUM 8.6* 02/15/2015 2350   ALKPHOS 85 04/28/2015 1101   ALKPHOS 86 01/08/2015 0949   AST 16 04/28/2015 1101   AST 18 01/08/2015 0949   ALT 13 04/28/2015 1101   ALT 13* 01/08/2015 0949   BILITOT 0.70 04/28/2015 1101   BILITOT 0.8 01/08/2015 0949       RADIOGRAPHIC STUDIES: No results found.  ASSESSMENT AND PLAN: This is a very pleasant 45 years old Asian female never smoker with recently diagnosed with stage IV non-small cell lung cancer, adenocarcinoma presented with right middle lobe lung mass in addition to right hilar adenopathy and metastatic disease to the bones, brain and left adrenal gland.  She status post whole brain irradiation with some improvement  in her disease on the recent MRI of the brain. The patient completed treatment with Tarceva 150 mg by mouth daily for 5 months and tolerating it fairly well except for grade 2 skin rash and recent episodes of diarrhea. She is currently on Tarceva 100 mg by mouth daily started 02/19/2015. She is tolerating the lower dose of Tarceva much better. Her recent CT scan of the chest, abdomen and pelvis showed no concerning findings for disease progression but the patient has extensive radiation changes in the right perihilar area. Her bowels are under better control, and her skin is clearing some with use of clindagel. Her cough is managed with a guaifenesin-codeine syrup. This is expected to be there result of radiation changes to the lung. She will continue xgeva injections monthly, next due  today, and continue on her current dose of Tarceva by mouth daily. She is scheduled for a repeat brain MRI and follow up with Dr. Pablo Ledger later this month.  She will come back for follow-up visit in one month for reevaluation with repeat blood work. She was advised to call immediately if she has any concerning symptoms in the interval. The patient voices understanding of current disease status and treatment options and is in agreement with the current care plan  All questions were answered. The patient knows to call the clinic with any problems, questions or concerns. We can certainly see the patient much sooner if necessary.  Laurie Panda, NP 04/28/2015

## 2015-04-28 NOTE — Telephone Encounter (Signed)
appt made and avs printed °

## 2015-04-30 ENCOUNTER — Emergency Department (HOSPITAL_COMMUNITY): Payer: Medicaid Other

## 2015-04-30 ENCOUNTER — Encounter (HOSPITAL_COMMUNITY): Payer: Self-pay

## 2015-04-30 ENCOUNTER — Observation Stay (HOSPITAL_COMMUNITY)
Admission: EM | Admit: 2015-04-30 | Discharge: 2015-05-01 | Disposition: A | Discharge status: Home / Self Care | Payer: Medicaid Other | Attending: Internal Medicine | Admitting: Internal Medicine

## 2015-04-30 DIAGNOSIS — Z85118 Personal history of other malignant neoplasm of bronchus and lung: Secondary | ICD-10-CM | POA: Diagnosis not present

## 2015-04-30 DIAGNOSIS — Z7951 Long term (current) use of inhaled steroids: Secondary | ICD-10-CM | POA: Insufficient documentation

## 2015-04-30 DIAGNOSIS — Z8701 Personal history of pneumonia (recurrent): Secondary | ICD-10-CM | POA: Diagnosis not present

## 2015-04-30 DIAGNOSIS — C3490 Malignant neoplasm of unspecified part of unspecified bronchus or lung: Secondary | ICD-10-CM | POA: Diagnosis present

## 2015-04-30 DIAGNOSIS — J069 Acute upper respiratory infection, unspecified: Secondary | ICD-10-CM | POA: Diagnosis not present

## 2015-04-30 DIAGNOSIS — E876 Hypokalemia: Secondary | ICD-10-CM | POA: Diagnosis not present

## 2015-04-30 DIAGNOSIS — R042 Hemoptysis: Secondary | ICD-10-CM | POA: Diagnosis present

## 2015-04-30 DIAGNOSIS — Z79899 Other long term (current) drug therapy: Secondary | ICD-10-CM | POA: Diagnosis not present

## 2015-04-30 DIAGNOSIS — Z85841 Personal history of malignant neoplasm of brain: Secondary | ICD-10-CM | POA: Insufficient documentation

## 2015-04-30 DIAGNOSIS — K920 Hematemesis: Secondary | ICD-10-CM | POA: Diagnosis present

## 2015-04-30 DIAGNOSIS — C3491 Malignant neoplasm of unspecified part of right bronchus or lung: Secondary | ICD-10-CM

## 2015-04-30 LAB — COMPREHENSIVE METABOLIC PANEL
ALBUMIN: 3.8 g/dL (ref 3.5–5.0)
ALK PHOS: 89 U/L (ref 38–126)
ALT: 21 U/L (ref 14–54)
AST: 20 U/L (ref 15–41)
Anion gap: 5 (ref 5–15)
BILIRUBIN TOTAL: 0.8 mg/dL (ref 0.3–1.2)
BUN: 7 mg/dL (ref 6–20)
CO2: 27 mmol/L (ref 22–32)
Calcium: 8.5 mg/dL — ABNORMAL LOW (ref 8.9–10.3)
Chloride: 107 mmol/L (ref 101–111)
Creatinine, Ser: 0.53 mg/dL (ref 0.44–1.00)
GFR calc Af Amer: >60 mL/min (ref >60)
GFR calc non Af Amer: >60 mL/min (ref >60)
GLUCOSE: 78 mg/dL (ref 65–99)
POTASSIUM: 3.7 mmol/L (ref 3.5–5.1)
Sodium: 139 mmol/L (ref 135–145)
TOTAL PROTEIN: 6.9 g/dL (ref 6.5–8.1)

## 2015-04-30 LAB — CBC WITH DIFFERENTIAL/PLATELET
BASOS ABS: 0 10*3/uL (ref 0.0–0.1)
BASOS PCT: 0 %
Eosinophils Absolute: 0.2 10*3/uL (ref 0.0–0.7)
Eosinophils Relative: 2 %
HEMATOCRIT: 40.7 % (ref 36.0–46.0)
HEMOGLOBIN: 13.9 g/dL (ref 12.0–15.0)
Lymphocytes Relative: 15 %
Lymphs Abs: 1.3 10*3/uL (ref 0.7–4.0)
MCH: 30.2 pg (ref 26.0–34.0)
MCHC: 34.2 g/dL (ref 30.0–36.0)
MCV: 88.5 fL (ref 78.0–100.0)
Monocytes Absolute: 0.8 10*3/uL (ref 0.1–1.0)
Monocytes Relative: 10 %
NEUTROS ABS: 6.4 10*3/uL (ref 1.7–7.7)
NEUTROS PCT: 73 %
Platelets: 392 10*3/uL (ref 150–400)
RBC: 4.6 MIL/uL (ref 3.87–5.11)
RDW: 14 % (ref 11.5–15.5)
WBC: 8.8 10*3/uL (ref 4.0–10.5)

## 2015-04-30 LAB — PROTIME-INR
INR: 0.96 (ref 0.00–1.49)
Prothrombin Time: 13 seconds (ref 11.6–15.2)

## 2015-04-30 MED ORDER — SODIUM CHLORIDE 0.9 % IV SOLN
INTRAVENOUS | Status: DC
  Administered 2015-04-30: 19:00:00 via INTRAVENOUS

## 2015-04-30 MED ORDER — ALBUTEROL SULFATE (2.5 MG/3ML) 0.083% IN NEBU: 2.5000 mg | INHALATION_SOLUTION | RESPIRATORY_TRACT | Status: DC | PRN

## 2015-04-30 MED ORDER — MOMETASONE FURO-FORMOTEROL FUM 200-5 MCG/ACT IN AERO
2.0000 | INHALATION_SPRAY | Freq: Two times a day (BID) | RESPIRATORY_TRACT | Status: DC
  Administered 2015-05-01: 2 via RESPIRATORY_TRACT
  Filled 2015-04-30 (×2): qty 8.8

## 2015-04-30 MED ORDER — GUAIFENESIN ER 600 MG PO TB12
1200.0000 mg | ORAL_TABLET | Freq: Two times a day (BID) | ORAL | Status: DC
  Administered 2015-04-30 – 2015-05-01 (×2): 1200 mg via ORAL
  Filled 2015-04-30 (×2): qty 2

## 2015-04-30 MED ORDER — LORATADINE 10 MG PO TABS
10.0000 mg | ORAL_TABLET | Freq: Every day | ORAL | Status: DC
  Administered 2015-04-30 – 2015-05-01 (×2): 10 mg via ORAL
  Filled 2015-04-30 (×2): qty 1

## 2015-04-30 MED ORDER — ERLOTINIB HCL 100 MG PO TABS
100.0000 mg | ORAL_TABLET | Freq: Every day | ORAL | Status: DC
  Administered 2015-05-01: 100 mg via ORAL

## 2015-04-30 MED ORDER — FLUTICASONE PROPIONATE 50 MCG/ACT NA SUSP
1.0000 | Freq: Every day | NASAL | Status: DC
  Administered 2015-05-01: 1 via NASAL
  Filled 2015-04-30: qty 16

## 2015-04-30 MED ORDER — IOPAMIDOL (ISOVUE-300) INJECTION 61%
100.0000 mL | Freq: Once | INTRAVENOUS | Status: AC | PRN
  Administered 2015-04-30: 80 mL via INTRAVENOUS

## 2015-04-30 MED ORDER — GUAIFENESIN ER 600 MG PO TB12: 1200.0000 mg | ORAL_TABLET | Freq: Two times a day (BID) | ORAL | Status: DC

## 2015-04-30 MED ORDER — PROCHLORPERAZINE MALEATE 10 MG PO TABS: 10.0000 mg | ORAL_TABLET | Freq: Four times a day (QID) | ORAL | Status: DC | PRN

## 2015-04-30 MED ORDER — ZOLPIDEM TARTRATE 5 MG PO TABS
5.0000 mg | ORAL_TABLET | Freq: Every evening | ORAL | Status: DC | PRN
  Administered 2015-04-30: 5 mg via ORAL
  Filled 2015-04-30: qty 1

## 2015-04-30 MED ORDER — HYDROCODONE BITARTRATE ER 20 MG PO CP12: 1.0000 | ORAL_CAPSULE | Freq: Two times a day (BID) | ORAL | Status: DC

## 2015-04-30 MED ORDER — GUAIFENESIN-CODEINE 100-10 MG/5ML PO SOLN
5.0000 mL | ORAL | Status: DC | PRN
  Administered 2015-04-30 – 2015-05-01 (×2): 5 mL via ORAL
  Filled 2015-04-30 (×2): qty 5

## 2015-04-30 MED ORDER — SODIUM CHLORIDE 0.9 % IV BOLUS (SEPSIS)
500.0000 mL | Freq: Once | INTRAVENOUS | Status: AC
  Administered 2015-04-30: 500 mL via INTRAVENOUS

## 2015-04-30 MED ORDER — HYDROCODONE-ACETAMINOPHEN 5-325 MG PO TABS
1.0000 | ORAL_TABLET | ORAL | Status: DC | PRN
  Administered 2015-04-30: 1 via ORAL
  Administered 2015-05-01: 2 via ORAL
  Filled 2015-04-30: qty 1
  Filled 2015-04-30: qty 2

## 2015-04-30 NOTE — ED Notes (Signed)
Pt presents with c/o vomiting blood. Pt reports some abdominal pain, no diarrhea. Pt is a cancer patient, last chemo pill this morning. Pt has several bags of vomited blood with her.

## 2015-04-30 NOTE — ED Notes (Signed)
Pt given sandwich 

## 2015-04-30 NOTE — ED Notes (Signed)
Report given to nurse on [facility] [place]. Pt ready for transport.

## 2015-04-30 NOTE — H&P (Signed)
Patient Demographics  Deborah Foster, is a 45 y.o. female  MRN: [id number]   DOB - 27-Nov-1970  Admit Date - 04/30/2015  Outpatient Primary MD for the patient is [name], MD   With History of -  Past Medical History  Diagnosis Date  . Pneumonia   . Hemoptysis   . Lung mass   . Hypokalemia   . Radiation 08/23/14-09/07/14    Brain/chest and left hip 30 Gy 12 Fx  . lung ca dx'd 07/2014  . Metastasis to brain ([place])   . Metastasis to adrenal gland ([place])   . Bone metastasis ([place])   . URI (upper respiratory infection) 02-02-2015      Past Surgical History  Procedure Laterality Date  . Video bronchoscopy Bilateral 08/09/2014    Procedure: VIDEO BRONCHOSCOPY WITHOUT FLUORO;  Surgeon: [name], MD;  Location: [facility];  Service: Cardiopulmonary;  Laterality: Bilateral;    in for   Chief Complaint  Patient presents with  . Hematemesis     HPI  Deborah Foster  is a 45 y.o. female, past medical history of stage IV non-small cell lung cancer, adenocarcinoma, with right hilar adenopathy ,with metastases to brain, bones and adrenal gland, status post negative radiotherapy to brain as well metastatic bone lesions in the pelvis, and Tarceva andXgeva, patient presents with complaints of hematemesis, cough, and chest pain related to cough, patient reports his hemoptysis is chronic, started last November, minimal, but was more progressive over last 48 hours which prompted her to come to ED, denies fever, chills or productive sputum, in ED patient is afebrile, no leukocytosis, hemoglobin is stable, CT chest with IV contrast showing stable probable postradiation changes in the right periareolar region, hospitalist called to admit.    Review of Systems    In addition to the HPI above,  No Fever-chills, No Headache, No changes with Vision or hearing, No problems swallowing food or Liquids, Reports chest pain related to cough, reports cough, hemoptysis, denies dyspnea. No  Abdominal pain, No Nausea or Vommitting, Bowel movements are regular, No Blood in stool or Urine, No dysuria, No new skin rashes or bruises, No new joints pains-aches,  No new weakness, tingling, numbness in any extremity, No recent weight gain or loss, No polyuria, polydypsia or polyphagia, No significant Mental Stressors.  A full 10 point Review of Systems was done, except as stated above, all other Review of Systems were negative.   Social History Social History  Substance Use Topics  . Smoking status: Never Smoker   . Smokeless tobacco: Never Used  . Alcohol Use: No     Family History History reviewed. No pertinent family history.   Prior to Admission medications   Medication Sig Start Date End Date Taking? Authorizing Provider  clindamycin (CLINDAGEL) 1 % gel Apply topically 2 (two) times daily. 03/02/15  Yes [name], MD  erlotinib (TARCEVA) 100 MG tablet Take 1 tablet (100 mg total) by mouth daily. Take on an empty stomach 1 hour before meals or 2 hours after 04/28/15  Yes Heather F Boelter, NP  fluticasone (FLONASE) 50 MCG/ACT nasal spray Place 1 spray into both nostrils daily.   Yes Historical Provider, MD  guaiFENesin-codeine (CHERATUSSIN AC) 100-10 MG/5ML syrup Take 5 mLs by mouth 3 (three) times daily as needed for cough.   Yes Historical Provider, MD  HYDROcodone Bitartrate ER 20 MG CP12 Take 1 capsule by mouth 2 (two) times daily. 03/31/15  Yes [name], MD  loratadine [facility])  10 MG tablet Take 1 tablet (10 mg total) by mouth daily. 02/02/15  Yes [name], MD  mometasone-formoterol [facility]) 200-5 MCG/ACT AERO Inhale 2 puffs into the lungs 2 (two) times daily.   Yes Historical Provider, MD  [place]   Yes Historical Provider, MD  prochlorperazine (COMPAZINE) 10 MG tablet Take 1 tablet (10 mg total) by mouth every 6 (six) hours as needed for nausea or vomiting. Patient taking differently: Take 10 mg by mouth  every 6 (six) hours as needed for nausea or vomiting. for nausea or vomiting 12/07/14  Yes [name], MD    Allergies  Allergen Reactions  . Other Other (See Comments)    Patient states that there is some she takes that makes her throat "cry" AND SCRATCHY   . Doxycycline Nausea And Vomiting    Physical Exam  Vitals  Blood pressure 111/73, pulse 99, temperature 97.9 F (36.6 C), temperature source Oral, resp. rate 16, SpO2 100 %.   1. General small framed female lying in bed in NAD,    2. Normal affect and insight, Not Suicidal or Homicidal, Awake Alert, Oriented X 3.  3. No F.N deficits, ALL C.Nerves Intact, Strength 5/5 all 4 extremities, Sensation intact all 4 extremities, Plantars down going.  4. Ears and Eyes appear Normal, Conjunctivae clear, PERRLA. Moist Oral Mucosa.  5. Supple Neck, No JVD, No cervical lymphadenopathy appriciated, No Carotid Bruits.  6. Symmetrical Chest wall movement, Good air movement bilaterally, CTAB.  7. RRR, No Gallops, Rubs or Murmurs, No Parasternal Heave.  8. Positive Bowel Sounds, Abdomen Soft, No tenderness, No organomegaly appriciated,No rebound -guarding or rigidity.  9.  No Cyanosis, Normal Skin Turgor, No Skin Rash or Bruise.  10. Good muscle tone,  joints appear normal , no effusions, Normal ROM.   Data Review  CBC  Recent Labs Lab 04/28/15 1101 04/30/15 1215  WBC 6.4 8.8  HGB 13.4 13.9  HCT 41.3 40.7  PLT 401* 392  MCV 91.5 88.5  MCH 29.6 30.2  MCHC 32.3 34.2  RDW 14.3 14.0  LYMPHSABS 1.3 1.3  MONOABS 0.6 0.8  EOSABS 0.1 0.2  BASOSABS 0.0 0.0   ------------------------------------------------------------------------------------------------------------------  Chemistries   Recent Labs Lab 04/28/15 1101 04/30/15 1215  NA 140 139  K 3.8 3.7  CL  --  107  CO2 24 27  GLUCOSE 83 78  BUN 5.7* 7  CREATININE 0.6 0.53  CALCIUM 8.8 8.5*  AST 16 20  ALT 13 21  ALKPHOS 85 89  BILITOT 0.70 0.8    ------------------------------------------------------------------------------------------------------------------ estimated creatinine clearance is 64.4 mL/min (by C-G formula based on Cr of 0.53). ------------------------------------------------------------------------------------------------------------------ No results for input(s): TSH, T4TOTAL, T3FREE, THYROIDAB in the last 72 hours.  Invalid input(s): FREET3   Coagulation profile  Recent Labs Lab 04/30/15 1215  INR 0.96   ------------------------------------------------------------------------------------------------------------------- No results for input(s): DDIMER in the last 72 hours. -------------------------------------------------------------------------------------------------------------------  Cardiac Enzymes No results for input(s): CKMB, TROPONINI, MYOGLOBIN in the last 168 hours.  Invalid input(s): CK ------------------------------------------------------------------------------------------------------------------ Invalid input(s): POCBNP   ---------------------------------------------------------------------------------------------------------------  Urinalysis    Component Value Date/Time   COLORURINE YELLOW 02/04/2015 1630   APPEARANCEUR CLEAR 02/04/2015 1630   LABSPEC 1.007 02/04/2015 1630   LABSPEC 1.025 09/24/2014 1053   PHURINE 7.5 02/04/2015 1630   PHURINE 6.0 09/24/2014 1053   GLUCOSEU NEGATIVE 02/04/2015 1630   GLUCOSEU Negative 09/24/2014 1053   HGBUR TRACE* 02/04/2015 1630   HGBUR Moderate 09/24/2014 [place] NEGATIVE 02/04/2015 1630  BILIRUBINUR Negative 09/24/2014 1053   KETONESUR NEGATIVE 02/04/2015 1630   KETONESUR Negative 09/24/2014 1053   PROTEINUR NEGATIVE 02/04/2015 1630   PROTEINUR 100 09/24/2014 1053   UROBILINOGEN 0.2 09/24/2014 1053   UROBILINOGEN 0.2 08/13/2014 1059   NITRITE NEGATIVE 02/04/2015 1630   NITRITE Negative 09/24/2014 1053   LEUKOCYTESUR  TRACE* 02/04/2015 1630   LEUKOCYTESUR Small 09/24/2014 1053    ----------------------------------------------------------------------------------------------------------------  Imaging results:   Ct Chest W Contrast  04/30/2015  CLINICAL DATA:  Hemoptysis. Undergoing chemotherapy for lung cancer. EXAM: CT CHEST WITH CONTRAST TECHNIQUE: Multidetector CT imaging of the chest was performed during intravenous contrast administration. CONTRAST:  50m ISOVUE-300 IOPAMIDOL (ISOVUE-300) INJECTION 61% COMPARISON:  Previous examinations, the most recent dated 03/28/2015. FINDINGS: Mediastinum/Lymph Nodes: No masses, pathologically enlarged lymph nodes, or other significant abnormality. Lungs/Pleura: Patchy opacity, consolidation and bronchiectasis in the right perihilar region is unchanged. Small calcified granuloma in the superior segment of the right lower lobe. The remainder of the lungs remain clear. Upper abdomen: Mild diffuse low density of the liver relative to the spleen. Normal appearing adrenal glands. Musculoskeletal: Sclerosis and expansion of the right clavicular head is unchanged. A sclerotic focus in the L1 vertebral body is unchanged. Minimal thoracic spine degenerative changes. IMPRESSION: 1. Stable probable postradiation changes in the right perihilar region. 2. Stable probable changes of Paget's disease involving the medial right clavicle. Treated or sclerotic metastatic disease is also a possibility. 3. Stable treated or sclerotic metastasis in L1 vertebral body. Electronically Signed   By: SClaudie ReveringM.D.   On: 04/30/2015 13:58        Assessment & Plan  Active Problems:   Hemoptysis   Non-small cell carcinoma of lung, stage 4 (HCC)  Hemoptysis - Patient to be a chronic problem for last few month, patient reports increase in sputum production recently, this is most likely related to her lung cancer, as well postradiation changes, will monitor closely, will check CBC in a.m., will  admit for observation.  Non-small cell carcinoma of lung, with metastasis to left abdominal, bones and brain - Patient is followed and managed by Dr. MEarlie Serveras an outpatient. - Continue with her Tarceva - Liver workup as per Dr. MEarlie Serverin outpatient setting.   DVT Prophylaxis  SCDs  AM Labs Ordered, also please review Full Orders  Family Communication: Admission, patients condition and plan of care including tests being ordered have been discussed with the patient  who indicate understanding and agree with the plan and Code Status.  Code Status Full  Likely DC to  Home  Condition GUARDED    Time spent in minutes : 50 minutes    Brooklyn Alfredo M.D on 04/30/2015 at 5:49 PM  Between 7am to 7pm - Pager - [phone] After 7pm go to [email or web address] - password [email or web address]  And look for the night coverage person covering me after hours  [facility] Office  [phone]

## 2015-04-30 NOTE — ED Notes (Signed)
Pt states she is not vomiting blood but coughing blood.

## 2015-05-01 DIAGNOSIS — C3491 Malignant neoplasm of unspecified part of right bronchus or lung: Secondary | ICD-10-CM | POA: Diagnosis not present

## 2015-05-01 DIAGNOSIS — R042 Hemoptysis: Secondary | ICD-10-CM | POA: Diagnosis not present

## 2015-05-01 LAB — BASIC METABOLIC PANEL
Anion gap: 4 — ABNORMAL LOW (ref 5–15)
BUN: 6 mg/dL (ref 6–20)
CALCIUM: 7.5 mg/dL — AB (ref 8.9–10.3)
CO2: 23 mmol/L (ref 22–32)
CREATININE: 0.61 mg/dL (ref 0.44–1.00)
Chloride: 112 mmol/L — ABNORMAL HIGH (ref 101–111)
GFR calc Af Amer: >60 mL/min (ref >60)
GLUCOSE: 86 mg/dL (ref 65–99)
POTASSIUM: 3.4 mmol/L — AB (ref 3.5–5.1)
SODIUM: 139 mmol/L (ref 135–145)

## 2015-05-01 LAB — CBC
HCT: 35.5 % — ABNORMAL LOW (ref 36.0–46.0)
Hemoglobin: 12 g/dL (ref 12.0–15.0)
MCH: 29.8 pg (ref 26.0–34.0)
MCHC: 33.8 g/dL (ref 30.0–36.0)
MCV: 88.1 fL (ref 78.0–100.0)
PLATELETS: 336 10*3/uL (ref 150–400)
RBC: 4.03 MIL/uL (ref 3.87–5.11)
RDW: 14.1 % (ref 11.5–15.5)
WBC: 8.3 10*3/uL (ref 4.0–10.5)

## 2015-05-01 MED ORDER — GUAIFENESIN-CODEINE 100-10 MG/5ML PO SYRP: 5.0000 mL | ORAL_SOLUTION | Freq: Three times a day (TID) | ORAL | Status: DC | PRN

## 2015-05-01 NOTE — Discharge Instructions (Signed)
Follow with Primary MD [name], MD in 7 days   Get CBC, CMP, 2 view Chest X ray checked  by Primary MD next visit.    Activity: As tolerated with Full fall precautions use walker/cane & assistance as needed   Disposition Home    Diet: regular Diet , with feeding assistance and aspiration precautions.  For Heart failure patients - Check your Weight same time everyday, if you gain over 2 pounds, or you develop in leg swelling, experience more shortness of breath or chest pain, call your Primary MD immediately. Follow Cardiac Low Salt Diet and 1.5 lit/day fluid restriction.   On your next visit with your primary care physician please Get Medicines reviewed and adjusted.   Please request your Prim.MD to go over all Hospital Tests and Procedure/Radiological results at the follow up, please get all Hospital records sent to your Prim MD by signing hospital release before you go home.   If you experience worsening of your admission symptoms, develop shortness of breath, life threatening emergency, suicidal or homicidal thoughts you must seek medical attention immediately by calling 911 or calling your MD immediately  if symptoms less severe.  You Must read complete instructions/literature along with all the possible adverse reactions/side effects for all the Medicines you take and that have been prescribed to you. Take any new Medicines after you have completely understood and accpet all the possible adverse reactions/side effects.   Do not drive, operating heavy machinery, perform activities at heights, swimming or participation in water activities or provide baby sitting services if your were admitted for syncope or siezures until you have seen by Primary MD or a Neurologist and advised to do so again.  Do not drive when taking Pain medications.    Do not take more than prescribed Pain, Sleep and Anxiety Medications  Special Instructions: If you have smoked or chewed Tobacco   in the last 2 yrs please stop smoking, stop any regular Alcohol  and or any Recreational drug use.  Wear Seat belts while driving.   Please note  You were cared for by a hospitalist during your hospital stay. If you have any questions about your discharge medications or the care you received while you were in the hospital after you are discharged, you can call the unit and asked to speak with the hospitalist on call if the hospitalist that took care of you is not available. Once you are discharged, your primary care physician will handle any further medical issues. Please note that NO REFILLS for any discharge medications will be authorized once you are discharged, as it is imperative that you return to your primary care physician (or establish a relationship with a primary care physician if you do not have one) for your aftercare needs so that they can reassess your need for medications and monitor your lab values.

## 2015-05-01 NOTE — Progress Notes (Signed)
This nurse went over discharge medications and instructions with patient. Patient verbalized understanding. Patient discharged from hospital walking along side RN Nancy Marus.

## 2015-05-01 NOTE — ED Provider Notes (Signed)
CSN: [id number]     Arrival date & time 04/30/15  1107 History   First MD Initiated Contact with Patient 04/30/15 1153     Chief Complaint  Patient presents with  . Hematemesis     (Consider location/radiation/quality/duration/timing/severity/associated sxs/prior Treatment) Patient is a 45 y.o. female presenting with cough. The history is provided by the patient (Patient has been coughing up blood yesterday and today a total of 10 times. She has a history of lung cancer with radiation in the fall. She states she has no cancer seen on CT now).  Cough Cough characteristics: Bloody. Severity:  Moderate Onset quality:  Sudden Timing:  Constant Progression:  Waxing and waning Chronicity:  New Smoker: no   Context: not animal exposure   Associated symptoms: no chest pain, no eye discharge, no headaches and no rash     Past Medical History  Diagnosis Date  . Pneumonia   . Hemoptysis   . Lung mass   . Hypokalemia   . Radiation 08/23/14-09/07/14    Brain/chest and left hip 30 Gy 12 Fx  . lung ca dx'd 07/2014  . Metastasis to brain ([place])   . Metastasis to adrenal gland ([place])   . Bone metastasis ([place])   . URI (upper respiratory infection) 02-02-2015   Past Surgical History  Procedure Laterality Date  . Video bronchoscopy Bilateral 08/09/2014    Procedure: VIDEO BRONCHOSCOPY WITHOUT FLUORO;  Surgeon: [name], MD;  Location: [facility];  Service: Cardiopulmonary;  Laterality: Bilateral;   History reviewed. No pertinent family history. Social History  Substance Use Topics  . Smoking status: Never Smoker   . Smokeless tobacco: Never Used  . Alcohol Use: No   OB History    No data available     Review of Systems  Constitutional: Negative for appetite change and fatigue.  HENT: Negative for congestion, ear discharge and sinus pressure.   Eyes: Negative for discharge.  Respiratory: Positive for cough.        Coughing up blood  Cardiovascular: Negative for chest pain.   Gastrointestinal: Negative for abdominal pain and diarrhea.  Genitourinary: Negative for frequency and hematuria.  Musculoskeletal: Negative for back pain.  Skin: Negative for rash.  Neurological: Negative for seizures and headaches.  Psychiatric/Behavioral: Negative for hallucinations.      Allergies  Other and Doxycycline  Home Medications   Prior to Admission medications   Medication Sig Start Date End Date Taking? Authorizing Provider  clindamycin (CLINDAGEL) 1 % gel Apply topically 2 (two) times daily. 03/02/15  Yes [name], MD  erlotinib (TARCEVA) 100 MG tablet Take 1 tablet (100 mg total) by mouth daily. Take on an empty stomach 1 hour before meals or 2 hours after 04/28/15  Yes Heather F Boelter, NP  fluticasone (FLONASE) 50 MCG/ACT nasal spray Place 1 spray into both nostrils daily.   Yes Historical Provider, MD  guaiFENesin-codeine (CHERATUSSIN AC) 100-10 MG/5ML syrup Take 5 mLs by mouth 3 (three) times daily as needed for cough.   Yes Historical Provider, MD  HYDROcodone Bitartrate ER 20 MG CP12 Take 1 capsule by mouth 2 (two) times daily. 03/31/15  Yes [name], MD  loratadine (CLARITIN) 10 MG tablet Take 1 tablet (10 mg total) by mouth daily. 02/02/15  Yes [name], MD  mometasone-formoterol [facility]) 200-5 MCG/ACT AERO Inhale 2 puffs into the lungs 2 (two) times daily.   Yes Historical Provider, MD  [place]   Yes Historical Provider, MD  prochlorperazine (COMPAZINE)  10 MG tablet Take 1 tablet (10 mg total) by mouth every 6 (six) hours as needed for nausea or vomiting. Patient taking differently: Take 10 mg by mouth every 6 (six) hours as needed for nausea or vomiting. for nausea or vomiting 12/07/14  Yes [name], MD   BP 92/57 mmHg  Pulse 93  Temp(Src) 97.4 F (36.3 C) (Oral)  Resp 16  SpO2 99% Physical Exam  Constitutional: She is oriented to person, place, and time. She appears well-developed.   HENT:  Head: Normocephalic.  Eyes: Conjunctivae and EOM are normal. No scleral icterus.  Neck: Neck supple. No thyromegaly present.  Cardiovascular: Normal rate and regular rhythm.  Exam reveals no gallop and no friction rub.   No murmur heard. Pulmonary/Chest: No stridor. She has no wheezes. She has no rales. She exhibits no tenderness.  Abdominal: She exhibits no distension. There is no tenderness. There is no rebound.  Musculoskeletal: Normal range of motion. She exhibits no edema.  Lymphadenopathy:    She has no cervical adenopathy.  Neurological: She is oriented to person, place, and time. She exhibits normal muscle tone. Coordination normal.  Skin: No rash noted. No erythema.  Psychiatric: She has a normal mood and affect. Her behavior is normal.    ED Course  Procedures (including critical care time) Labs Review Labs Reviewed  COMPREHENSIVE METABOLIC PANEL - Abnormal; Notable for the following:    Calcium 8.5 (*)    All other components within normal limits  BASIC METABOLIC PANEL - Abnormal; Notable for the following:    Potassium 3.4 (*)    Chloride 112 (*)    Calcium 7.5 (*)    Anion gap 4 (*)    All other components within normal limits  CBC - Abnormal; Notable for the following:    HCT 35.5 (*)    All other components within normal limits  CBC WITH DIFFERENTIAL/PLATELET  PROTIME-INR    Imaging Review Ct Chest W Contrast  04/30/2015  CLINICAL DATA:  Hemoptysis. Undergoing chemotherapy for lung cancer. EXAM: CT CHEST WITH CONTRAST TECHNIQUE: Multidetector CT imaging of the chest was performed during intravenous contrast administration. CONTRAST:  96m ISOVUE-300 IOPAMIDOL (ISOVUE-300) INJECTION 61% COMPARISON:  Previous examinations, the most recent dated 03/28/2015. FINDINGS: Mediastinum/Lymph Nodes: No masses, pathologically enlarged lymph nodes, or other significant abnormality. Lungs/Pleura: Patchy opacity, consolidation and bronchiectasis in the right perihilar  region is unchanged. Small calcified granuloma in the superior segment of the right lower lobe. The remainder of the lungs remain clear. Upper abdomen: Mild diffuse low density of the liver relative to the spleen. Normal appearing adrenal glands. Musculoskeletal: Sclerosis and expansion of the right clavicular head is unchanged. A sclerotic focus in the L1 vertebral body is unchanged. Minimal thoracic spine degenerative changes. IMPRESSION: 1. Stable probable postradiation changes in the right perihilar region. 2. Stable probable changes of Paget's disease involving the medial right clavicle. Treated or sclerotic metastatic disease is also a possibility. 3. Stable treated or sclerotic metastasis in L1 vertebral body. Electronically Signed   By: SClaudie ReveringM.D.   On: 04/30/2015 13:58   I have personally reviewed and evaluated these images and lab results as part of my medical decision-making.   EKG Interpretation None      MDM   Final diagnoses:  Hemoptysis    CT scan of chest shows postradiation changes. Labs were unremarkable. Patient will be admitted  for hemoptysis    JMilton Ferguson MD 05/01/15 [phone]

## 2015-05-01 NOTE — Discharge Summary (Signed)
Deborah Foster, is a 45 y.o. female  DOB Aug 24, 1970  MRN [id number].  Admission date:  04/30/2015  Admitting Physician  Reubin Milan, MD  Discharge Date:  05/01/2015   Primary MD  [name], MD  Recommendations for primary care physician for things to follow:  - Follow with oncology as an outpatient   Admission Diagnosis  Hemoptysis [R04.2]   Discharge Diagnosis  Hemoptysis [R04.2]    Active Problems:   Hemoptysis   Non-small cell carcinoma of lung, stage 4 ([place])      Past Medical History  Diagnosis Date  . Pneumonia   . Hemoptysis   . Lung mass   . Hypokalemia   . Radiation 08/23/14-09/07/14    Brain/chest and left hip 30 Gy 12 Fx  . lung ca dx'd 07/2014  . Metastasis to brain ([place])   . Metastasis to adrenal gland ([place])   . Bone metastasis ([place])   . URI (upper respiratory infection) 02-02-2015    Past Surgical History  Procedure Laterality Date  . Video bronchoscopy Bilateral 08/09/2014    Procedure: VIDEO BRONCHOSCOPY WITHOUT FLUORO;  Surgeon: [name], MD;  Location: [facility];  Service: Cardiopulmonary;  Laterality: Bilateral;       History of present illness and  Hospital Course:     Kindly see H&P for history of present illness and admission details, please review complete Labs, Consult reports and Test reports for all details in brief  HPI  from the history and physical done on the day of admission 04/30/2015  Deborah Foster is a 45 y.o. female, past medical history of stage IV non-small cell lung cancer, adenocarcinoma, with right hilar adenopathy ,with metastases to brain, bones and adrenal gland, status post negative radiotherapy to brain as well metastatic bone lesions in the pelvis, and Tarceva andXgeva, patient presents with complaints of hematemesis, cough, and chest pain related to cough, patient reports his hemoptysis is chronic, started last November,  minimal, but was more progressive over last 48 hours which prompted her to come to ED, denies fever, chills or productive sputum, in ED patient is afebrile, no leukocytosis, hemoglobin is stable, CT chest with IV contrast showing stable probable postradiation changes in the right periareolar region, hospitalist called to admit.  Hospital Course   Hemoptysis - This seems to be a chronic problem for last few month, patient reports increase in sputum production recently, this is most likely related to her lung cancer, as well postradiation changes, patient was admitted for observation overnight, minimal amount of hemoptysis noticed over last 24 hours, patient reports it is at her baseline, hemoglobin remained stable, no respiratory distress, she will be discharged to follow with oncology as an outpatient.   Non-small cell carcinoma of lung, with metastasis to left abdominal, bones and brain - Patient is followed and managed by Dr. Earlie Server as an outpatient.       Discharge Condition:  stable   Follow UP  Follow-up Information    Follow up with [name], MD. Schedule  an appointment as soon as possible for a visit in 2 weeks.   Specialty:  Family Medicine   Contact information:   [place] [address] [phone]       Follow up with Eilleen Kempf., MD. Schedule an appointment as soon as possible for a visit in 1 week.   Specialty:  Oncology   Contact information:   [place] [address] [phone]         Discharge Instructions  and  Discharge Medications     Discharge Instructions    Discharge instructions    Complete by:  As directed   Follow with Primary MD [name], MD in 7 days   Get CBC, CMP, 2 view Chest X ray checked  by Primary MD next visit.    Activity: As tolerated with Full fall precautions use walker/cane & assistance as needed   Disposition Home    Diet: regular Diet , with  feeding assistance and aspiration precautions.  For Heart failure patients - Check your Weight same time everyday, if you gain over 2 pounds, or you develop in leg swelling, experience more shortness of breath or chest pain, call your Primary MD immediately. Follow Cardiac Low Salt Diet and 1.5 lit/day fluid restriction.   On your next visit with your primary care physician please Get Medicines reviewed and adjusted.   Please request your Prim.MD to go over all Hospital Tests and Procedure/Radiological results at the follow up, please get all Hospital records sent to your Prim MD by signing hospital release before you go home.   If you experience worsening of your admission symptoms, develop shortness of breath, life threatening emergency, suicidal or homicidal thoughts you must seek medical attention immediately by calling 911 or calling your MD immediately  if symptoms less severe.  You Must read complete instructions/literature along with all the possible adverse reactions/side effects for all the Medicines you take and that have been prescribed to you. Take any new Medicines after you have completely understood and accpet all the possible adverse reactions/side effects.   Do not drive, operating heavy machinery, perform activities at heights, swimming or participation in water activities or provide baby sitting services if your were admitted for syncope or siezures until you have seen by Primary MD or a Neurologist and advised to do so again.  Do not drive when taking Pain medications.    Do not take more than prescribed Pain, Sleep and Anxiety Medications  Special Instructions: If you have smoked or chewed Tobacco  in the last 2 yrs please stop smoking, stop any regular Alcohol  and or any Recreational drug use.  Wear Seat belts while driving.   Please note  You were cared for by a hospitalist during your hospital stay. If you have any questions about your discharge medications or  the care you received while you were in the hospital after you are discharged, you can call the unit and asked to speak with the hospitalist on call if the hospitalist that took care of you is not available. Once you are discharged, your primary care physician will handle any further medical issues. Please note that NO REFILLS for any discharge medications will be authorized once you are discharged, as it is imperative that you return to your primary care physician (or establish a relationship with a primary care physician if you do not have one) for your aftercare needs so that they can reassess your need for medications and  monitor your lab values.     Increase activity slowly    Complete by:  As directed             Medication List    TAKE these medications        CHERATUSSIN AC 100-10 MG/5ML syrup  Generic drug:  guaiFENesin-codeine  Take 5 mLs by mouth 3 (three) times daily as needed for cough.     clindamycin 1 % gel  Commonly known as:  CLINDAGEL  Apply topically 2 (two) times daily.     erlotinib 100 MG tablet  Commonly known as:  TARCEVA  Take 1 tablet (100 mg total) by mouth daily. Take on an empty stomach 1 hour before meals or 2 hours after     fluticasone 50 MCG/ACT nasal spray  Commonly known as:  FLONASE  Place 1 spray into both nostrils daily.     HYDROcodone Bitartrate ER 20 MG Cp12  Take 1 capsule by mouth 2 (two) times daily.     loratadine 10 MG tablet  Commonly known as:  CLARITIN  Take 1 tablet (10 mg total) by mouth daily.     mometasone-formoterol 200-5 MCG/ACT Aero  Commonly known as:  DULERA  Inhale 2 puffs into the lungs 2 (two) times daily.     PRESCRIPTION MEDICATION  Supportive Therapy [facility]     prochlorperazine 10 MG tablet  Commonly known as:  COMPAZINE  Take 1 tablet (10 mg total) by mouth every 6 (six) hours as needed for nausea or vomiting.          Diet and Activity recommendation: See Discharge Instructions above   Consults  obtained -  none   Major procedures and Radiology Reports - PLEASE review detailed and final reports for all details, in brief -      Ct Chest W Contrast  04/30/2015  CLINICAL DATA:  Hemoptysis. Undergoing chemotherapy for lung cancer. EXAM: CT CHEST WITH CONTRAST TECHNIQUE: Multidetector CT imaging of the chest was performed during intravenous contrast administration. CONTRAST:  18m ISOVUE-300 IOPAMIDOL (ISOVUE-300) INJECTION 61% COMPARISON:  Previous examinations, the most recent dated 03/28/2015. FINDINGS: Mediastinum/Lymph Nodes: No masses, pathologically enlarged lymph nodes, or other significant abnormality. Lungs/Pleura: Patchy opacity, consolidation and bronchiectasis in the right perihilar region is unchanged. Small calcified granuloma in the superior segment of the right lower lobe. The remainder of the lungs remain clear. Upper abdomen: Mild diffuse low density of the liver relative to the spleen. Normal appearing adrenal glands. Musculoskeletal: Sclerosis and expansion of the right clavicular head is unchanged. A sclerotic focus in the L1 vertebral body is unchanged. Minimal thoracic spine degenerative changes. IMPRESSION: 1. Stable probable postradiation changes in the right perihilar region. 2. Stable probable changes of Paget's disease involving the medial right clavicle. Treated or sclerotic metastatic disease is also a possibility. 3. Stable treated or sclerotic metastasis in L1 vertebral body. Electronically Signed   By: SClaudie ReveringM.D.   On: 04/30/2015 13:58    Micro Results     No results found for this or any previous visit (from the past 240 hour(s)).     Today   Subjective:   Linsie Fosse today has no headache,no chest or  abdominal pain, reports minimal hemoptysis, at her baseline, feels much better wants to go home today.  Objective:   Blood pressure 92/57, pulse 93, temperature 97.4 F (36.3 C), temperature source Oral, resp. rate 16, SpO2 99  %.   Intake/Output Summary (Last 24 hours) at 05/01/15 1046  Last data filed at 05/01/15 0555  Gross per 24 hour  Intake 1162.8 ml  Output   1300 ml  Net -137.2 ml    Exam Awake Alert, Oriented x 3 Supple Neck,No JVD, Symmetrical Chest wall movement, Good air movement bilaterally, CTAB RRR,No Gallops,Rubs or new Murmurs, No Parasternal Heave +ve B.Sounds, Abd Soft, Non tender, No organomegaly appriciated, No rebound -guarding or rigidity. No Cyanosis, Clubbing or edema, No new Rash or bruise  Data Review   CBC w Diff: Lab Results  Component Value Date   WBC 8.3 05/01/2015   WBC 6.4 04/28/2015   HGB 12.0 05/01/2015   HGB 13.4 04/28/2015   HCT 35.5* 05/01/2015   HCT 41.3 04/28/2015   PLT 336 05/01/2015   PLT 401* 04/28/2015   LYMPHOPCT 15 04/30/2015   LYMPHOPCT 20.1 04/28/2015   MONOPCT 10 04/30/2015   MONOPCT 9.4 04/28/2015   EOSPCT 2 04/30/2015   EOSPCT 2.2 04/28/2015   BASOPCT 0 04/30/2015   BASOPCT 0.5 04/28/2015    CMP: Lab Results  Component Value Date   NA 139 05/01/2015   NA 140 04/28/2015   K 3.4* 05/01/2015   K 3.8 04/28/2015   CL 112* 05/01/2015   CO2 23 05/01/2015   CO2 24 04/28/2015   BUN 6 05/01/2015   BUN 5.7* 04/28/2015   CREATININE 0.61 05/01/2015   CREATININE 0.6 04/28/2015   PROT 6.9 04/30/2015   PROT 6.5 04/28/2015   ALBUMIN 3.8 04/30/2015   ALBUMIN 3.3* 04/28/2015   BILITOT 0.8 04/30/2015   BILITOT 0.70 04/28/2015   ALKPHOS 89 04/30/2015   ALKPHOS 85 04/28/2015   AST 20 04/30/2015   AST 16 04/28/2015   ALT 21 04/30/2015   ALT 13 04/28/2015  .   Total Time in preparing paper work, data evaluation and todays exam - 30 minutes  Taja Pentland M.D on 05/01/2015 at 10:46 AM  [facility]   Office  [phone]

## 2015-05-02 ENCOUNTER — Other Ambulatory Visit: Payer: Self-pay | Admitting: Internal Medicine

## 2015-05-05 ENCOUNTER — Ambulatory Visit (HOSPITAL_COMMUNITY)
Admission: RE | Admit: 2015-05-05 | Discharge: 2015-05-05 | Disposition: A | Discharge status: Home / Self Care | Payer: Medicaid Other | Source: Ambulatory Visit | Attending: Radiation Oncology | Admitting: Radiation Oncology

## 2015-05-05 ENCOUNTER — Other Ambulatory Visit (HOSPITAL_COMMUNITY)
Admission: RE | Admit: 2015-05-05 | Discharge: 2015-05-05 | Disposition: A | Discharge status: Home / Self Care | Payer: Medicaid Other | Source: Ambulatory Visit | Attending: Radiation Oncology | Admitting: Radiation Oncology

## 2015-05-05 ENCOUNTER — Ambulatory Visit
Admission: RE | Admit: 2015-05-05 | Discharge: 2015-05-05 | Disposition: A | Discharge status: Home / Self Care | Payer: Medicaid Other | Source: Ambulatory Visit | Attending: Radiation Oncology | Admitting: Radiation Oncology

## 2015-05-05 DIAGNOSIS — R319 Hematuria, unspecified: Secondary | ICD-10-CM

## 2015-05-05 DIAGNOSIS — C7931 Secondary malignant neoplasm of brain: Secondary | ICD-10-CM | POA: Diagnosis not present

## 2015-05-05 LAB — RENAL FUNCTION PANEL
ALBUMIN: 3.9 g/dL (ref 3.5–5.0)
ANION GAP: 8 (ref 5–15)
BUN: 8 mg/dL (ref 6–20)
CO2: 26 mmol/L (ref 22–32)
Calcium: 9 mg/dL (ref 8.9–10.3)
Chloride: 105 mmol/L (ref 101–111)
Creatinine, Ser: 0.53 mg/dL (ref 0.44–1.00)
GFR calc Af Amer: >60 mL/min (ref >60)
Glucose, Bld: 88 mg/dL (ref 65–99)
PHOSPHORUS: 2.9 mg/dL (ref 2.5–4.6)
Potassium: 3.9 mmol/L (ref 3.5–5.1)
Sodium: 139 mmol/L (ref 135–145)

## 2015-05-05 MED ORDER — GADOBENATE DIMEGLUMINE 529 MG/ML IV SOLN
10.0000 mL | Freq: Once | INTRAVENOUS | Status: AC | PRN
  Administered 2015-05-05: 8 mL via INTRAVENOUS

## 2015-05-05 MED FILL — CLINDAMYCIN PH 1% GEL: 1 | 10 days supply | Qty: 30 | Fill #1

## 2015-05-09 ENCOUNTER — Telehealth: Payer: Self-pay | Admitting: Medical Oncology

## 2015-05-09 DIAGNOSIS — C3491 Malignant neoplasm of unspecified part of right bronchus or lung: Secondary | ICD-10-CM

## 2015-05-09 MED ORDER — ERLOTINIB HCL 100 MG PO TABS: 100.0000 mg | ORAL_TABLET | Freq: Every day | ORAL | Status: DC

## 2015-05-09 NOTE — Telephone Encounter (Signed)
Tarceva rx faxed to [facility].

## 2015-05-11 NOTE — Progress Notes (Signed)
Deborah Foster is here today for 3 month follow up visit for Non-small cell carcinoma of lung, stage 4 to receive results of MRI brain scan.   Weight changes, if any:  Wt Readings from Last 3 Encounters:  05/12/15 99 lb 6.4 oz (45.088 kg)  04/28/15 99 lb 1.6 oz (44.951 kg)  03/31/15 98 lb 12.8 oz (44.815 kg)   Respiratory complaints, if any: Coughing secretions white and thick, denies SOB Hemoptysis, if any:No   Swallowing Problems/Pain/Difficulty swallowing:None, throat itching Smoking Tobacco/Marijuana/Snuff/ETOH use:No Skin:to chest and back normal Pain : 3/10 taking Hydrocodone Appetite:Does not want to eat very much tries nothing taste good. Energy level:Having fatigue some days in the afternoon. When is next chemo scheduled?:05-26-15 Xgenva ( Denosumab) 120 mg. Injection  monthly, Tarceva 100 mg daily Lab work from of chart:05-05-15 BP 114/50 mmHg  Pulse 101  Temp(Src) 97.6 F (36.4 C) (Oral)  Resp 16  Ht '5\' 2"'$  (1.575 m)  Wt 99 lb 6.4 oz (45.088 kg)  BMI 18.18 kg/m2  SpO2 100%

## 2015-05-12 ENCOUNTER — Encounter: Payer: Self-pay | Admitting: Radiation Oncology

## 2015-05-12 ENCOUNTER — Ambulatory Visit
Admission: RE | Admit: 2015-05-12 | Discharge: 2015-05-12 | Disposition: A | Discharge status: Home / Self Care | Payer: Medicaid Other | Source: Ambulatory Visit | Attending: Radiation Oncology | Admitting: Radiation Oncology

## 2015-05-12 VITALS — BP 114/50 | HR 101 | Temp 97.6°F | Resp 16 | Ht 62.0 in | Wt 99.4 lb

## 2015-05-12 DIAGNOSIS — C7931 Secondary malignant neoplasm of brain: Secondary | ICD-10-CM

## 2015-05-12 NOTE — Progress Notes (Signed)
   Department of Radiation Oncology  Phone:  [phone] Fax:        [phone]   Name: Deborah Foster MRN: [id number]  DOB: 1970-08-25  Date: 05/12/2015  Follow Up Visit Note  Diagnosis: Non-small cell carcinoma of lung, stage 4   Staging form: Lung, AJCC 7th Edition     Clinical stage from 08/26/2014: Stage IV (T2a, N1, M1b) - Signed by [name], MD on 08/26/2014  Summary and Interval since last radiation: The patient's radiation treatment dates extended from 08/23/2014-09/07/2014. The site and dose used includes the brain, right chest and left hip treated to 30 Gy in 12 fractions.   Interval History:  Brain MRI on 05/05/15 shows unchanged multiple hemorrhagic brain metastasis, no evidence of new lesions. Patient was admitted to the ED on 04/30/15 with bouts hemoptysis this was determined to be due to post radiation changes. She has not had further hemoptysis. She continues to complain of cough.  The codeine she is taking takes away the cough for 10 minutes or so and then it returns. This is her most worrisome symptom.  She had a CT of the chest that was negative for new or progressive disease.     Wt Readings from Last 3 Encounters:   05/12/15  99 lb 6.4 oz (45.088 kg)   04/28/15  99 lb 1.6 oz (44.951 kg)   03/31/15  98 lb 12.8 oz (44.815 kg)         Respiratory complaints, if any: Persistent coughing secretions white and thick, denies SOB  Hemoptysis, if any: Not on today's visit  Swallowing Problems/Pain/Difficulty swallowing:None, throat itching  Smoking Tobacco/Marijuana/Snuff/ETOH use:No  Skin:to chest and back normal  Pain : 3/10 taking Hydrocodone  Appetite:Does not want to eat very much, loss of appetite, tries nothing taste good.  Energy level:Having fatigue some days in the afternoon.  When is next chemo scheduled?:05-26-15 Xgenva ( Denosumab) 120 mg. Injection monthly, Tarceva 100 mg daily  Lab work from of chart:05-05-15  She states  her current cough medication does little to alleviate   Physical Exam: The patient is alert and oriented. Rash over face.  Filed Vitals:   05/12/15 1445  BP: 114/50  Pulse: 101  Temp: 97.6 F (36.4 C)  TempSrc: Oral  Resp: 16  Height: '5\' 2"'$  (1.575 m)  Weight: 99 lb 6.4 oz (45.088 kg)  SpO2: 100%   IMPRESSION: Deborah Foster is a 45 y.o. female with Stage 4 non-small cell lung cancer with good response to treatment   PLAN: Follow up in 3 months with repeat brain MRI. Continue tarceva.  Follow up with medical oncology regarding cough suppresant.   All vocalized questions and concerns have been addressed. If the patient develops any further questions or concerns in regards to her treatment and recovery, she has been encouraged to contact Dr. Pablo Ledger, MD.  ------------------------------------------------  Thea Silversmith, MD  This document serves as a record of services personally performed by Thea Silversmith, MD. It was created on her behalf by Derek Mound, a trained medical scribe. The creation of this record is based on the scribe's personal observations and the provider's statements to them. This document has been checked and approved by the attending provider.

## 2015-05-25 ENCOUNTER — Other Ambulatory Visit: Payer: Self-pay | Admitting: Medical Oncology

## 2015-05-25 DIAGNOSIS — C3491 Malignant neoplasm of unspecified part of right bronchus or lung: Secondary | ICD-10-CM

## 2015-05-25 DIAGNOSIS — C7931 Secondary malignant neoplasm of brain: Secondary | ICD-10-CM

## 2015-05-26 ENCOUNTER — Ambulatory Visit (HOSPITAL_BASED_OUTPATIENT_CLINIC_OR_DEPARTMENT_OTHER): Payer: Medicaid Other | Admitting: Internal Medicine

## 2015-05-26 ENCOUNTER — Other Ambulatory Visit (HOSPITAL_BASED_OUTPATIENT_CLINIC_OR_DEPARTMENT_OTHER): Payer: Medicaid Other

## 2015-05-26 ENCOUNTER — Ambulatory Visit (HOSPITAL_BASED_OUTPATIENT_CLINIC_OR_DEPARTMENT_OTHER): Payer: Medicaid Other

## 2015-05-26 ENCOUNTER — Encounter: Payer: Self-pay | Admitting: Internal Medicine

## 2015-05-26 ENCOUNTER — Telehealth: Payer: Self-pay | Admitting: Internal Medicine

## 2015-05-26 VITALS — BP 102/56 | HR 100 | Temp 97.8°F | Resp 18 | Ht 62.0 in | Wt 99.2 lb

## 2015-05-26 DIAGNOSIS — C3491 Malignant neoplasm of unspecified part of right bronchus or lung: Secondary | ICD-10-CM

## 2015-05-26 DIAGNOSIS — C342 Malignant neoplasm of middle lobe, bronchus or lung: Secondary | ICD-10-CM

## 2015-05-26 DIAGNOSIS — C7931 Secondary malignant neoplasm of brain: Secondary | ICD-10-CM

## 2015-05-26 DIAGNOSIS — C7951 Secondary malignant neoplasm of bone: Secondary | ICD-10-CM

## 2015-05-26 DIAGNOSIS — M898X9 Other specified disorders of bone, unspecified site: Secondary | ICD-10-CM

## 2015-05-26 DIAGNOSIS — R21 Rash and other nonspecific skin eruption: Secondary | ICD-10-CM | POA: Diagnosis not present

## 2015-05-26 DIAGNOSIS — Z5111 Encounter for antineoplastic chemotherapy: Secondary | ICD-10-CM

## 2015-05-26 DIAGNOSIS — E889 Metabolic disorder, unspecified: Secondary | ICD-10-CM

## 2015-05-26 DIAGNOSIS — C7972 Secondary malignant neoplasm of left adrenal gland: Secondary | ICD-10-CM | POA: Diagnosis not present

## 2015-05-26 DIAGNOSIS — M9080 Osteopathy in diseases classified elsewhere, unspecified site: Principal | ICD-10-CM

## 2015-05-26 DIAGNOSIS — K1230 Oral mucositis (ulcerative), unspecified: Secondary | ICD-10-CM

## 2015-05-26 LAB — CBC WITH DIFFERENTIAL/PLATELET
BASO%: 0.4 % (ref 0.0–2.0)
BASOS ABS: 0 10*3/uL (ref 0.0–0.1)
EOS ABS: 0.1 10*3/uL (ref 0.0–0.5)
EOS%: 1.3 % (ref 0.0–7.0)
HCT: 40.6 % (ref 34.8–46.6)
HEMOGLOBIN: 13.6 g/dL (ref 11.6–15.9)
LYMPH%: 18.4 % (ref 14.0–49.7)
MCH: 30.8 pg (ref 25.1–34.0)
MCHC: 33.5 g/dL (ref 31.5–36.0)
MCV: 92.1 fL (ref 79.5–101.0)
MONO#: 0.5 10*3/uL (ref 0.1–0.9)
MONO%: 6.2 % (ref 0.0–14.0)
NEUT#: 5.6 10*3/uL (ref 1.5–6.5)
NEUT%: 73.7 % (ref 38.4–76.8)
Platelets: 426 10*3/uL — ABNORMAL HIGH (ref 145–400)
RBC: 4.41 10*6/uL (ref 3.70–5.45)
RDW: 13.9 % (ref 11.2–14.5)
WBC: 7.6 10*3/uL (ref 3.9–10.3)
lymph#: 1.4 10*3/uL (ref 0.9–3.3)

## 2015-05-26 LAB — COMPREHENSIVE METABOLIC PANEL
ALBUMIN: 3.7 g/dL (ref 3.5–5.0)
ALK PHOS: 80 U/L (ref 40–150)
ALT: 18 U/L (ref 0–55)
AST: 17 U/L (ref 5–34)
Anion Gap: 7 mEq/L (ref 3–11)
BUN: 12.3 mg/dL (ref 7.0–26.0)
CHLORIDE: 106 meq/L (ref 98–109)
CO2: 28 mEq/L (ref 22–29)
Calcium: 9.1 mg/dL (ref 8.4–10.4)
Creatinine: 0.7 mg/dL (ref 0.6–1.1)
GLUCOSE: 105 mg/dL (ref 70–140)
POTASSIUM: 3.8 meq/L (ref 3.5–5.1)
SODIUM: 141 meq/L (ref 136–145)
Total Bilirubin: 0.72 mg/dL (ref 0.20–1.20)
Total Protein: 7.1 g/dL (ref 6.4–8.3)

## 2015-05-26 MED ORDER — DENOSUMAB 120 MG/1.7ML ~~LOC~~ SOLN
120.0000 mg | Freq: Once | SUBCUTANEOUS | Status: AC
  Administered 2015-05-26: 120 mg via SUBCUTANEOUS
  Filled 2015-05-26: qty 1.7

## 2015-05-26 MED ORDER — MAGIC MOUTHWASH W/LIDOCAINE: 5.0000 mL | Freq: Three times a day (TID) | ORAL | Status: DC | PRN

## 2015-05-26 MED ORDER — LORATADINE 10 MG PO TABS: 10.0000 mg | ORAL_TABLET | Freq: Every day | ORAL | Status: DC

## 2015-05-26 MED FILL — LORATADINE 10 MG TABLET: 10 | 20 days supply | Qty: 20 | Fill #0

## 2015-05-26 MED FILL — MAGIC MOUTHWASH W/LIDO 1:1: 32 days supply | Qty: 480 | Fill #0

## 2015-05-26 NOTE — Progress Notes (Signed)
[facility] [phone]   [phone]  OFFICE PROGRESS NOTE  [name], MD [place] [address]  DIAGNOSIS: Stage IV (T2a, N1, M1b) non-small cell lung cancer, adenocarcinoma presented with right middle lobe lung mass in addition to right hilar adenopathy as well as metastatic disease to the bone, brain as well as left adrenal metastasis diagnosed in July 2016  PRIOR THERAPY:  1) palliative radiotherapy to the brain as well as metastatic bone lesions in the pelvis. 2) Tarceva 150 mg by mouth daily started on 09/25/2014. Status post 5 months of treatment  CURRENT THERAPY:  1) Tarceva 100 mg by mouth daily started on 02/19/2015. Status post 3 months of treatment. 2) Xgeva 120 g subcutaneously monthly.  INTERVAL HISTORY: Deborah Foster 45 y.o. female returns to the clinic today for hospital follow-up visit accompanied by her interpreter. The patient is tolerating her current treatment with Tarceva fairly well except for grade 1 skin rash mainly on the face and upper chest. She continues to have dry cough and she is on several cough medications. She denied having any significant chest pain, or shortness of breath. She denied having any nausea or vomiting. She has occasional diarrhea. She is requesting refill of Claritin and Magic mouthwash today.  MEDICAL HISTORY: Past Medical History  Diagnosis Date  . Pneumonia   . Hemoptysis   . Lung mass   . Hypokalemia   . Radiation 08/23/14-09/07/14    Brain/chest and left hip 30 Gy 12 Fx  . lung ca dx'd 07/2014  . Metastasis to brain ([facility])   . Metastasis to adrenal gland ([place])   . Bone metastasis ([place])   . URI (upper respiratory infection) 02-02-2015    ALLERGIES:  is allergic to other and doxycycline.  MEDICATIONS:  Current Outpatient Prescriptions  Medication Sig Dispense Refill  . clindamycin (CLINDAGEL) 1 % gel Apply topically 2 (two) times daily. 30 g 1  . erlotinib  (TARCEVA) 100 MG tablet Take 1 tablet (100 mg total) by mouth daily. Take on an empty stomach 1 hour before meals or 2 hours after 30 tablet 1  . fluticasone (FLONASE) 50 MCG/ACT nasal spray Place 1 spray into both nostrils daily.    Marland Kitchen guaiFENesin-codeine (CHERATUSSIN AC) 100-10 MG/5ML syrup Take 5 mLs by mouth 3 (three) times daily as needed for cough. 120 mL 0  . HYDROcodone Bitartrate ER 20 MG CP12 Take 1 capsule by mouth 2 (two) times daily. 60 capsule 0  . loratadine (CLARITIN) 10 MG tablet Take 1 tablet (10 mg total) by mouth daily. (Patient not taking: Reported on 05/12/2015) 20 tablet 0  . mometasone-formoterol (DULERA) 200-5 MCG/ACT AERO Inhale 2 puffs into the lungs 2 (two) times daily.    Marland Kitchen PRESCRIPTION MEDICATION Reported on 05/12/2015    . prochlorperazine (COMPAZINE) 10 MG tablet Take 1 tablet (10 mg total) by mouth every 6 (six) hours as needed for nausea or vomiting. (Patient not taking: Reported on 05/12/2015) 30 tablet 0   No current facility-administered medications for this visit.    SURGICAL HISTORY:  Past Surgical History  Procedure Laterality Date  . Video bronchoscopy Bilateral 08/09/2014    Procedure: VIDEO BRONCHOSCOPY WITHOUT FLUORO;  Surgeon: [name], MD;  Location: [facility];  Service: Cardiopulmonary;  Laterality: Bilateral;    REVIEW OF SYSTEMS:  A comprehensive review of systems was negative except for: Constitutional: positive for fatigue Respiratory: positive for cough and dyspnea on exertion  PHYSICAL EXAMINATION: General appearance: alert, cooperative, fatigued and no distress Head: Normocephalic, without obvious abnormality, atraumatic Neck: no adenopathy, no JVD, supple, symmetrical, trachea midline and thyroid not enlarged, symmetric, no tenderness/mass/nodules Lymph nodes: Cervical, supraclavicular, and axillary nodes normal. Resp: clear to auscultation bilaterally Back: symmetric, no curvature. ROM normal. No CVA tenderness. Cardio:  regular rate and rhythm, S1, S2 normal, no murmur, click, rub or gallop GI: soft, non-tender; bowel sounds normal; no masses,  no organomegaly Extremities: extremities normal, atraumatic, no cyanosis or edema Neurologic: Alert and oriented X 3, normal strength and tone. Normal symmetric reflexes. Normal coordination and gait  Skin exam showed maculopapular grade 2 skin rash on the face and upper chest.  ECOG PERFORMANCE STATUS: 1 - Symptomatic but completely ambulatory  Blood pressure 102/56, pulse 100, temperature 97.8 F (36.6 C), temperature source Oral, resp. rate 18, height '5\' 2"'$  (1.575 m), weight 99 lb 3.2 oz (44.997 kg), SpO2 99 %.  LABORATORY DATA: Lab Results  Component Value Date   WBC 7.6 05/26/2015   HGB 13.6 05/26/2015   HCT 40.6 05/26/2015   MCV 92.1 05/26/2015   PLT 426* 05/26/2015      Chemistry      Component Value Date/Time   NA 141 05/26/2015 1030   NA 139 05/05/2015 1030   K 3.8 05/26/2015 1030   K 3.9 05/05/2015 1030   CL 105 05/05/2015 1030   CO2 28 05/26/2015 1030   CO2 26 05/05/2015 1030   BUN 12.3 05/26/2015 1030   BUN 8 05/05/2015 1030   CREATININE 0.7 05/26/2015 1030   CREATININE 0.53 05/05/2015 1030      Component Value Date/Time   CALCIUM 9.1 05/26/2015 1030   CALCIUM 9.0 05/05/2015 1030   ALKPHOS 80 05/26/2015 1030   ALKPHOS 89 04/30/2015 1215   AST 17 05/26/2015 1030   AST 20 04/30/2015 1215   ALT 18 05/26/2015 1030   ALT 21 04/30/2015 1215   BILITOT 0.72 05/26/2015 1030   BILITOT 0.8 04/30/2015 1215       RADIOGRAPHIC STUDIES: Ct Chest W Contrast  04/30/2015  CLINICAL DATA:  Hemoptysis. Undergoing chemotherapy for lung cancer. EXAM: CT CHEST WITH CONTRAST TECHNIQUE: Multidetector CT imaging of the chest was performed during intravenous contrast administration. CONTRAST:  90m ISOVUE-300 IOPAMIDOL (ISOVUE-300) INJECTION 61% COMPARISON:  Previous examinations, the most recent dated 03/28/2015. FINDINGS: Mediastinum/Lymph Nodes: No  masses, pathologically enlarged lymph nodes, or other significant abnormality. Lungs/Pleura: Patchy opacity, consolidation and bronchiectasis in the right perihilar region is unchanged. Small calcified granuloma in the superior segment of the right lower lobe. The remainder of the lungs remain clear. Upper abdomen: Mild diffuse low density of the liver relative to the spleen. Normal appearing adrenal glands. Musculoskeletal: Sclerosis and expansion of the right clavicular head is unchanged. A sclerotic focus in the L1 vertebral body is unchanged. Minimal thoracic spine degenerative changes. IMPRESSION: 1. Stable probable postradiation changes in the right perihilar region. 2. Stable probable changes of Paget's disease involving the medial right clavicle. Treated or sclerotic metastatic disease is also a possibility. 3. Stable treated or sclerotic metastasis in L1 vertebral body. Electronically Signed   By: SClaudie ReveringM.D.   On: 04/30/2015 13:58   Mr BJeri CosWXBContrast  05/05/2015  CLINICAL DATA:  Metastatic lung cancer. History of chemo and radiation. Evaluate for tumor progression. EXAM: MRI HEAD WITHOUT AND WITH CONTRAST TECHNIQUE: Multiplanar, multiecho pulse sequences of the brain and surrounding structures were obtained without and with intravenous contrast. CONTRAST:  62m MULTIHANCE GADOBENATE DIMEGLUMINE 529 MG/ML IV SOLN COMPARISON:  MRI 01/31/2015, 10/29/2014 FINDINGS: Multiple hemorrhagic metastatic deposits in the brain are again identified and unchanged. The lesions show methemoglobin and minimal enhancement. Small lesion in the left cerebellum stable. Small lesions in the right posterior temporal lobe and right occipital lobe unchanged. Moderately large lesion in the left posterior temporal lobe unchanged with a mild amount of edema. Small lesion in the right medial parietal lobe unchanged. Right medial frontal lobe and left frontal lobe lesions unchanged No new lesions identified.  No acute  infarct. Ventricle size normal.  No shift of the midline structures. Negative calvarium.  Negative for skull base. IMPRESSION: Multiple hemorrhagic brain metastasis are stable.  No new lesions. Electronically Signed   By: CFranchot GalloM.D.   On: 05/05/2015 14:46    ASSESSMENT AND PLAN: This is a very pleasant 45years old Asian female never smoker with recently diagnosed with stage IV non-small cell lung cancer, adenocarcinoma presented with right middle lobe lung mass in addition to right hilar adenopathy and metastatic disease to the bones, brain and left adrenal gland.  She status post whole brain irradiation with some improvement in her disease on the recent MRI of the brain. The patient completed treatment with Tarceva 150 mg by mouth daily for 5 months and tolerating it fairly well except for grade 2 skin rash and recent episodes of diarrhea. She is currently on Tarceva 100 mg by mouth daily started 02/19/2015. She is tolerating the lower dose of Tarceva much better. Her recent CT scan of the chest performed last month showed no evidence for disease progression. I discussed the scan results with the patient today. I recommended for her to continue her current treatment with Tarceva 100 mg by mouth daily. For the skin rash, she will continue on clindamycin lotion. For the metastatic bone lesions, she started treatment with Xgeva after receiving dental clearance.  She was also given a refill for Magic mouthwash. For the dry cough and occasional hemoptysis, this is most likely secondary to the extensive radiation changes in the right perihilar area. I gave the patient refill of her cough medication. She will come back for follow-up visit in one month for reevaluation with repeat blood work. She was advised to call immediately if she has any concerning symptoms in the interval. The patient voices understanding of current disease status and treatment options and is in agreement with the current  care plan.  All questions were answered. The patient knows to call the clinic with any problems, questions or concerns. We can certainly see the patient much sooner if necessary.  Disclaimer: This note was dictated with voice recognition software. Similar sounding words can inadvertently be transcribed and may not be corrected upon review.

## 2015-05-26 NOTE — Telephone Encounter (Signed)
Gave adn printed appt shced and avs for pt for June

## 2015-06-02 ENCOUNTER — Inpatient Hospital Stay (HOSPITAL_COMMUNITY)
Admission: EM | Admit: 2015-06-02 | Discharge: 2015-06-06 | DRG: 206 | Disposition: A | Discharge status: Home / Self Care | Payer: Medicaid Other | Attending: Internal Medicine | Admitting: Internal Medicine

## 2015-06-02 ENCOUNTER — Telehealth: Payer: Self-pay | Admitting: *Deleted

## 2015-06-02 ENCOUNTER — Emergency Department (HOSPITAL_COMMUNITY): Payer: Medicaid Other

## 2015-06-02 ENCOUNTER — Encounter (HOSPITAL_COMMUNITY): Payer: Self-pay | Admitting: *Deleted

## 2015-06-02 DIAGNOSIS — C7951 Secondary malignant neoplasm of bone: Secondary | ICD-10-CM | POA: Diagnosis not present

## 2015-06-02 DIAGNOSIS — C7970 Secondary malignant neoplasm of unspecified adrenal gland: Secondary | ICD-10-CM | POA: Diagnosis present

## 2015-06-02 DIAGNOSIS — J209 Acute bronchitis, unspecified: Secondary | ICD-10-CM | POA: Diagnosis present

## 2015-06-02 DIAGNOSIS — E279 Disorder of adrenal gland, unspecified: Secondary | ICD-10-CM

## 2015-06-02 DIAGNOSIS — Z889 Allergy status to unspecified drugs, medicaments and biological substances status: Secondary | ICD-10-CM | POA: Diagnosis not present

## 2015-06-02 DIAGNOSIS — J42 Unspecified chronic bronchitis: Secondary | ICD-10-CM | POA: Diagnosis present

## 2015-06-02 DIAGNOSIS — K921 Melena: Secondary | ICD-10-CM | POA: Diagnosis present

## 2015-06-02 DIAGNOSIS — Z881 Allergy status to other antibiotic agents status: Secondary | ICD-10-CM | POA: Diagnosis not present

## 2015-06-02 DIAGNOSIS — E278 Other specified disorders of adrenal gland: Secondary | ICD-10-CM | POA: Diagnosis present

## 2015-06-02 DIAGNOSIS — C7931 Secondary malignant neoplasm of brain: Secondary | ICD-10-CM | POA: Diagnosis present

## 2015-06-02 DIAGNOSIS — K1230 Oral mucositis (ulcerative), unspecified: Secondary | ICD-10-CM

## 2015-06-02 DIAGNOSIS — D62 Acute posthemorrhagic anemia: Secondary | ICD-10-CM | POA: Diagnosis present

## 2015-06-02 DIAGNOSIS — Z79891 Long term (current) use of opiate analgesic: Secondary | ICD-10-CM

## 2015-06-02 DIAGNOSIS — D638 Anemia in other chronic diseases classified elsewhere: Secondary | ICD-10-CM | POA: Diagnosis not present

## 2015-06-02 DIAGNOSIS — Z66 Do not resuscitate: Secondary | ICD-10-CM | POA: Diagnosis not present

## 2015-06-02 DIAGNOSIS — R042 Hemoptysis: Secondary | ICD-10-CM | POA: Diagnosis present

## 2015-06-02 DIAGNOSIS — E538 Deficiency of other specified B group vitamins: Secondary | ICD-10-CM | POA: Diagnosis not present

## 2015-06-02 DIAGNOSIS — R21 Rash and other nonspecific skin eruption: Secondary | ICD-10-CM

## 2015-06-02 DIAGNOSIS — C3490 Malignant neoplasm of unspecified part of unspecified bronchus or lung: Secondary | ICD-10-CM | POA: Diagnosis present

## 2015-06-02 DIAGNOSIS — R000 Tachycardia, unspecified: Secondary | ICD-10-CM | POA: Diagnosis present

## 2015-06-02 DIAGNOSIS — Z5111 Encounter for antineoplastic chemotherapy: Secondary | ICD-10-CM

## 2015-06-02 DIAGNOSIS — Y842 Radiological procedure and radiotherapy as the cause of abnormal reaction of the patient, or of later complication, without mention of misadventure at the time of the procedure: Secondary | ICD-10-CM | POA: Diagnosis present

## 2015-06-02 DIAGNOSIS — R079 Chest pain, unspecified: Secondary | ICD-10-CM | POA: Diagnosis present

## 2015-06-02 DIAGNOSIS — Z79899 Other long term (current) drug therapy: Secondary | ICD-10-CM

## 2015-06-02 DIAGNOSIS — J700 Acute pulmonary manifestations due to radiation: Secondary | ICD-10-CM | POA: Diagnosis not present

## 2015-06-02 DIAGNOSIS — C3491 Malignant neoplasm of unspecified part of right bronchus or lung: Secondary | ICD-10-CM

## 2015-06-02 LAB — BASIC METABOLIC PANEL
ANION GAP: 5 (ref 5–15)
BUN: 8 mg/dL (ref 6–20)
CO2: 27 mmol/L (ref 22–32)
Calcium: 9.3 mg/dL (ref 8.9–10.3)
Chloride: 107 mmol/L (ref 101–111)
Creatinine, Ser: 0.49 mg/dL (ref 0.44–1.00)
Glucose, Bld: 95 mg/dL (ref 65–99)
POTASSIUM: 3.9 mmol/L (ref 3.5–5.1)
SODIUM: 139 mmol/L (ref 135–145)

## 2015-06-02 LAB — CBC
HEMATOCRIT: 38.5 % (ref 36.0–46.0)
HEMOGLOBIN: 13.1 g/dL (ref 12.0–15.0)
MCH: 31 pg (ref 26.0–34.0)
MCHC: 34 g/dL (ref 30.0–36.0)
MCV: 91 fL (ref 78.0–100.0)
Platelets: 411 10*3/uL — ABNORMAL HIGH (ref 150–400)
RBC: 4.23 MIL/uL (ref 3.87–5.11)
RDW: 13.7 % (ref 11.5–15.5)
WBC: 8.2 10*3/uL (ref 4.0–10.5)

## 2015-06-02 LAB — I-STAT TROPONIN, ED: Troponin i, poc: 0 ng/mL (ref 0.00–0.08)

## 2015-06-02 MED ORDER — ZOLPIDEM TARTRATE 5 MG PO TABS
5.0000 mg | ORAL_TABLET | Freq: Once | ORAL | Status: AC
  Administered 2015-06-02: 5 mg via ORAL
  Filled 2015-06-02: qty 1

## 2015-06-02 MED ORDER — METHYLPREDNISOLONE SODIUM SUCC 125 MG IJ SOLR
80.0000 mg | Freq: Two times a day (BID) | INTRAMUSCULAR | Status: DC
  Administered 2015-06-02 – 2015-06-03 (×2): 80 mg via INTRAVENOUS
  Filled 2015-06-02 (×2): qty 2

## 2015-06-02 MED ORDER — HYDROCOD POLST-CPM POLST ER 10-8 MG/5ML PO SUER: 5.0000 mL | Freq: Two times a day (BID) | ORAL | Status: DC | PRN

## 2015-06-02 MED ORDER — FLUTICASONE PROPIONATE 50 MCG/ACT NA SUSP
1.0000 | Freq: Every day | NASAL | Status: DC
  Administered 2015-06-02 – 2015-06-05 (×4): 1 via NASAL
  Filled 2015-06-02 (×2): qty 16

## 2015-06-02 MED ORDER — MOMETASONE FURO-FORMOTEROL FUM 200-5 MCG/ACT IN AERO
2.0000 | INHALATION_SPRAY | Freq: Two times a day (BID) | RESPIRATORY_TRACT | Status: DC
  Administered 2015-06-03 – 2015-06-06 (×6): 2 via RESPIRATORY_TRACT
  Filled 2015-06-02 (×2): qty 8.8

## 2015-06-02 MED ORDER — ERLOTINIB HCL 100 MG PO TABS: 100.0000 mg | ORAL_TABLET | Freq: Every day | ORAL | Status: DC

## 2015-06-02 MED ORDER — BENZONATATE 100 MG PO CAPS
100.0000 mg | ORAL_CAPSULE | Freq: Three times a day (TID) | ORAL | Status: DC | PRN
  Administered 2015-06-03 (×2): 100 mg via ORAL
  Filled 2015-06-02 (×4): qty 1

## 2015-06-02 MED ORDER — ENSURE ENLIVE PO LIQD
237.0000 mL | Freq: Two times a day (BID) | ORAL | Status: DC
  Administered 2015-06-02: 237 mL via ORAL

## 2015-06-02 MED ORDER — GUAIFENESIN-CODEINE 100-10 MG/5ML PO SOLN
5.0000 mL | Freq: Four times a day (QID) | ORAL | Status: DC | PRN
  Administered 2015-06-02 – 2015-06-06 (×11): 5 mL via ORAL
  Filled 2015-06-02 (×12): qty 5

## 2015-06-02 MED ORDER — MAGIC MOUTHWASH W/LIDOCAINE
5.0000 mL | Freq: Three times a day (TID) | ORAL | Status: DC | PRN
  Filled 2015-06-02: qty 5

## 2015-06-02 MED ORDER — IOPAMIDOL (ISOVUE-370) INJECTION 76%
100.0000 mL | Freq: Once | INTRAVENOUS | Status: AC | PRN
  Administered 2015-06-02: 80 mL via INTRAVENOUS

## 2015-06-02 MED ORDER — HYDROCODONE-ACETAMINOPHEN 5-325 MG PO TABS
1.0000 | ORAL_TABLET | Freq: Four times a day (QID) | ORAL | Status: DC | PRN
  Administered 2015-06-02: 2 via ORAL
  Administered 2015-06-03: 1 via ORAL
  Filled 2015-06-02 (×2): qty 2

## 2015-06-02 MED ORDER — LORATADINE 10 MG PO TABS
10.0000 mg | ORAL_TABLET | Freq: Every day | ORAL | Status: DC
  Administered 2015-06-02 – 2015-06-06 (×5): 10 mg via ORAL
  Filled 2015-06-02 (×5): qty 1

## 2015-06-02 MED ORDER — ACETAMINOPHEN 325 MG PO TABS
650.0000 mg | ORAL_TABLET | Freq: Four times a day (QID) | ORAL | Status: DC | PRN
Start: 2015-06-02 — End: 2015-06-06

## 2015-06-02 MED ORDER — HYDROCODONE-ACETAMINOPHEN 5-325 MG PO TABS: 1.0000 | ORAL_TABLET | ORAL | Status: DC | PRN

## 2015-06-02 MED ORDER — ONDANSETRON HCL 4 MG PO TABS: 4.0000 mg | ORAL_TABLET | Freq: Four times a day (QID) | ORAL | Status: DC | PRN

## 2015-06-02 MED ORDER — HYDROCODONE BITARTRATE ER 20 MG PO CP12: 20.0000 mg | ORAL_CAPSULE | Freq: Two times a day (BID) | ORAL | Status: DC | PRN

## 2015-06-02 MED ORDER — MORPHINE SULFATE (PF) 2 MG/ML IV SOLN
2.0000 mg | INTRAVENOUS | Status: DC | PRN
  Administered 2015-06-02: 2 mg via INTRAVENOUS
  Filled 2015-06-02: qty 1

## 2015-06-02 MED ORDER — BENZONATATE 100 MG PO CAPS
100.0000 mg | ORAL_CAPSULE | Freq: Two times a day (BID) | ORAL | Status: DC
  Administered 2015-06-02: 100 mg via ORAL
  Filled 2015-06-02: qty 1

## 2015-06-02 MED ORDER — ONDANSETRON HCL 4 MG/2ML IJ SOLN: 4.0000 mg | Freq: Four times a day (QID) | INTRAMUSCULAR | Status: DC | PRN

## 2015-06-02 MED ORDER — GUAIFENESIN-CODEINE 100-10 MG/5ML PO SOLN
5.0000 mL | Freq: Two times a day (BID) | ORAL | Status: DC
  Administered 2015-06-02: 5 mL via ORAL
  Filled 2015-06-02: qty 5

## 2015-06-02 MED ORDER — GUAIFENESIN-CODEINE 100-10 MG/5ML PO SYRP: 5.0000 mL | ORAL_SOLUTION | Freq: Three times a day (TID) | ORAL | Status: DC | PRN

## 2015-06-02 MED ORDER — ACETAMINOPHEN 650 MG RE SUPP: 650.0000 mg | Freq: Four times a day (QID) | RECTAL | Status: DC | PRN

## 2015-06-02 NOTE — ED Notes (Signed)
Report called to 3W.

## 2015-06-02 NOTE — Telephone Encounter (Signed)
Pt called stating she has been coughing up blood and requested to talk to nurse.   Spoke with pt and was informed that pt has been coughing up blood for 3 days.  Asked about amount of blood; pt stated " a lot of red blood ".   Stated she had taken Tarceva dose this am.   Stated she took cough medicine as prescribed at 4 am with some relief.   Denied fever.  Stated she does have nosebleed with coughing. Dr. Julien Nordmann notified.  Spoke with pt again and instructed pt that per Dr. Julien Nordmann, pt can go to ER for further evaluation and for possible pulmonologist evaluation.  Pt voiced understanding. Pt's    Phone      [phone].

## 2015-06-02 NOTE — ED Notes (Signed)
RN Starting IV and will draw labs

## 2015-06-02 NOTE — Consult Note (Signed)
Name: Deborah Foster MRN: [id number] DOB: February 21, 1970    ADMISSION DATE:  06/02/2015 CONSULTATION DATE:  06/02/15  REFERRING MD :  Dr. Vanita Panda  CHIEF COMPLAINT:  Hemoptysis   HISTORY OF PRESENT ILLNESS:  45 y/o F with PMH of a RML lung mass with hilar adenopathy (found in July 2016) with biopsy consistent with Stage IV (T2a, N1, M1b) non-small cell lung cancer, adenocarcinoma as well as metastatic disease to the bone, brain and left adrenal gland s/p palliative radiotherapy to brain, pelvic lesions on Tarceva (09/2014) who presented to [facility] on 06/02/15 with reports of a three day history of SOB, cough with bright red bloody sputum.    ER evaluation found her to be tachycardic and tachypneic but no significant distress.  CTA of the chest assessed and was negative for PE, demonstrated stable post radiation changes in the R perihilar region with architectural distortion and fibrosis, no clear explanation of hemoptysis, stable treated osseous metastatic disease.  Initial labs - Na 139, K 3.9, CL 107, CO2 27, Sr Cr 0.49, WBC 8.2, Hgb 13.1, and platelets 411.  The patient reports good oral intake, drinking water.  Despite this, she has lost weight from 114 to 106 lbs.  She reports coughing to the point of vomiting.  Has also noted bright red blood occasionally in her stool.   PCCM consulted for evaluation of hemoptysis.    PAST MEDICAL HISTORY :   has a past medical history of Pneumonia; Hemoptysis; Lung mass; Hypokalemia; Radiation (08/23/14-09/07/14); lung ca (dx'd 07/2014); Metastasis to brain [facility]); Metastasis to adrenal gland ([place]); Bone metastasis ([place]); and URI (upper respiratory infection) (02-02-2015).  has past surgical history that includes Video bronchoscopy (Bilateral, 08/09/2014).   Prior to Admission medications   Medication Sig Start Date End Date Taking? Authorizing Provider  Calcium Carbonate-Vit D-Min (CALCIUM 600+D PLUS MINERALS) 600-400 MG-UNIT TABS Take 1 tablet by mouth 2 (two) times  daily.   Yes Historical Provider, MD  clindamycin (CLINDAGEL) 1 % gel Apply topically 2 (two) times daily. 03/02/15  Yes [name], MD  erlotinib (TARCEVA) 100 MG tablet Take 1 tablet (100 mg total) by mouth daily. Take on an empty stomach 1 hour before meals or 2 hours after 05/09/15  Yes [name], MD  fluticasone [facility]) 50 MCG/ACT nasal spray Place 1 spray into both nostrils daily.   Yes Historical Provider, MD  guaiFENesin-codeine (CHERATUSSIN AC) 100-10 MG/5ML syrup Take 5 mLs by mouth 3 (three) times daily as needed for cough. 05/01/15  Yes Albertine Patricia, MD  HYDROcodone Bitartrate ER 20 MG CP12 Take 1 capsule by mouth 2 (two) times daily. Patient taking differently: Take 1 capsule by mouth 2 (two) times daily as needed (for pain).  03/31/15  Yes [name], MD  loratadine (CLARITIN) 10 MG tablet Take 1 tablet (10 mg total) by mouth daily. 05/26/15  Yes [name], MD  magic mouthwash w/lidocaine SOLN Take 5 mLs by mouth 3 (three) times daily as needed for mouth pain. 05/26/15  Yes [name], MD  mometasone-formoterol [facility]) 200-5 MCG/ACT AERO Inhale 2 puffs into the lungs 2 (two) times daily.   Yes Historical Provider, MD  prochlorperazine (COMPAZINE) 10 MG tablet Take 1 tablet (10 mg total) by mouth every 6 (six) hours as needed for nausea or vomiting. Patient not taking: Reported on 06/02/2015 12/07/14   [name], MD   Allergies  Allergen Reactions  . Other Other (See Comments)    Patient states that there is some she  takes that makes her throat "cry" AND SCRATCHY   . Doxycycline Nausea And Vomiting    FAMILY HISTORY:  family history is not on file.   SOCIAL HISTORY:  reports that she has never smoked. She has never used smokeless tobacco. She reports that she does not drink alcohol or use illicit drugs.  REVIEW OF SYSTEMS:  POSITIVES IN BOLD Constitutional: Negative for fever, chills, weight loss, malaise/fatigue and diaphoresis.  HENT:  Negative for hearing loss, ear pain, nosebleeds, congestion, sore throat, neck pain, tinnitus and ear discharge.   Eyes: Negative for blurred vision, double vision, photophobia, pain, discharge and redness.  Respiratory: Negative for cough, hemoptysis, sputum production, shortness of breath, wheezing and stridor.   Cardiovascular: Negative for chest pain, palpitations, orthopnea, claudication, leg swelling and PND.  Gastrointestinal: Negative for heartburn, nausea, vomiting, abdominal pain, diarrhea, constipation, blood in stool and melena.  Genitourinary: Negative for dysuria, urgency, frequency, hematuria and flank pain.  Musculoskeletal: Negative for myalgias, back pain, joint pain and falls.  Skin: Negative for itching and rash.  Neurological: Negative for dizziness, tingling, tremors, sensory change, speech change, focal weakness, seizures, loss of consciousness, weakness and headaches.  Endo/Heme/Allergies: Negative for environmental allergies and polydipsia. Does not bruise/bleed easily.  SUBJECTIVE:   VITAL SIGNS: Temp:  [98.2 F (36.8 C)] 98.2 F (36.8 C) (05/18 0911) Pulse Rate:  [95-112] 112 (05/18 1414) Resp:  [18-23] 19 (05/18 1414) BP: (103-112)/(65-69) 103/67 mmHg (05/18 1414) SpO2:  [97 %-100 %] 100 % (05/18 1414) Weight:  [99 lb (44.906 kg)] 99 lb (44.906 kg) (05/18 0911)  PHYSICAL EXAMINATION: General:  Thin adult female in NAD  Neuro:  AAOx4, speech clear, MAE  HEENT:  MM pink/moist, facial flushing  Cardiovascular:  s1s2 rrr, no m/r/g Lungs:  Even/non-labored, lungs bilaterally clear  Abdomen:  Flat, soft, bsx4 active  Musculoskeletal:  No acute deformities  Skin:  Warm/dry, no edema    Recent Labs Lab 06/02/15 1052  NA 139  K 3.9  CL 107  CO2 27  BUN 8  CREATININE 0.49  GLUCOSE 95    Recent Labs Lab 06/02/15 1052  HGB 13.1  HCT 38.5  WBC 8.2  PLT 411*   Dg Chest 2 View  06/02/2015  CLINICAL DATA:  Coughing up blood. History of lung cancer  and brain metastasis. EXAM: CHEST  2 VIEW COMPARISON:  CT 04/30/2015, radiograph 02/15/2015 FINDINGS: Normal cardiac silhouette. Stable RIGHT perihilar mass like consolidation not changed comparison CT or radiograph. No pleural fluid. No pulmonary edema. No pneumothorax. IMPRESSION: 1. No interval change. 2. Stable RIGHT perihilar masslike consolidation. Electronically Signed   By: Suzy Bouchard M.D.   On: 06/02/2015 10:04   Ct Angio Chest Pe W/cm &/or Wo Cm  06/02/2015  CLINICAL DATA:  45 year old with hemoptysis for 3 days with chest pain. History of lung cancer. Chemotherapy ongoing. EXAM: CT ANGIOGRAPHY CHEST WITH CONTRAST TECHNIQUE: Multidetector CT imaging of the chest was performed using the standard protocol during bolus administration of intravenous contrast. Multiplanar CT image reconstructions and MIPs were obtained to evaluate the vascular anatomy. CONTRAST:  80 ml Isovue-300. COMPARISON:  Radiographs 06/02/2015.  CT 04/30/2015. FINDINGS: Mediastinum: The pulmonary arteries are adequately, but not optimally opacified with contrast. There is no evidence of acute pulmonary embolism. The main pulmonary artery is dilated, consistent with pulmonary arterial hypertension. No significant systemic arterial abnormalities are identified. The heart size is normal. There is no pericardial effusion. There are no enlarged mediastinal, hilar or axillary lymph nodes. The thyroid  gland, trachea and esophagus demonstrate no significant findings. Lungs/Pleura: There is no pleural effusion.There is stable bronchiectasis and right perihilar opacity extending into the right middle and lower lobes, attributed to prior radiation therapy. No recurrent mass lesion or endobronchial lesion identified. There is a stable calcified granuloma in the superior segment of the right lower lobe. The left lung is clear. Upper abdomen: The visualized upper abdomen appears stable without suspicious findings. Musculoskeletal/Chest wall:  No chest wall lesion or acute osseous findings.There is stable sclerosis of the right humeral head, right clavicular head and sternal manubrium. Old fracture of the right sixth rib noted. Review of the MIP images confirms the above findings. IMPRESSION: 1. No evidence of acute pulmonary embolism. 2. No clear explanation for hemoptysis identified. 3. Stable probable post radiation changes in the right perihilar region with architectural distortion and fibrosis. No specific evidence of local recurrence or metastatic disease. Given the patient's history, bronchoscopy may be warranted if there is persistent unexplained hemoptysis. 4. Stable treated osseous metastatic disease. Electronically Signed   By: Richardean Sale M.D.   On: 06/02/2015 12:06    SIGNIFICANT EVENTS  5/18  Admit with hemoptysis   STUDIES:  5/18  CT Chest >> negative for PE, no clear explanation for hemoptysis, stable post radiation changes in R perihilar region with architectural distortion / fibrosis, no specific evidence of local recurrence or metastatic disease, stable osseous metastatic disease   ASSESSMENT / PLAN:  Hemoptysis - in setting of adenocarcinoma, possible radiation fibrosis.  Neg for PE  Metastatic Adenocarcinoma - followed by Dr. Julien Nordmann, on [place], no evidence of progression on CT at present  Cough - secondary to above  Plan: Cough suppression > tessalon + guaifenesin-codeine Solumedrol 80 mg IV Q12  Follow intermittent CXR Assess CBC, monitor Hgb  May need bronchoscopy if cough suppression does not suppress hemoptysis  Continue Dulera, claritin   GLOBAL:  Extensive discussion (greater than 30 minutes) with patient using interpreter iPAD regarding her wishes in the event of decline (cardiac or respiratory arrest).  She indicates she wants to everything she can to be better or utilize therapies that would help her live longer with a reasonable quality of life.  She has three boys under the age of 66 and  wants to "live for them".  However, she states that she would not want to suffer at the end of life - she is afraid of pain and shortness of breath when that time comes.  After discussion regarding intubation / CPR at EOL, she would not want these measures if they were related to malignancy progression.  She is comfortable with DNR / DNI but does want to speak with her husband about the issue.    Plan: Recommend follow up conversation with family and patient once available.   DNR / DNI in the event of arrest >> full / aggressive medical care otherwise   Noe Gens, NP-C [facility] Pgr: [phone] or if no answer [phone] 06/02/2015, 2:53 PM

## 2015-06-02 NOTE — ED Notes (Signed)
Pt reports hemoptysis x 3 days with cp.  Pt also feels weakness.  Pt has lung cancer and is taking PO chemo daily.

## 2015-06-02 NOTE — H&P (Signed)
History and Physical    RAILEY GLAD DGL:[id number] DOB: Apr 12, 1970 DOA: 06/02/2015    PCP: [name], MD     Patient coming from: home  Chief Complaint: hemoptysis  HPI: Deborah Foster is a 45 y.o. female with medical history significant of non-small cell CA with RML lung mass on Tarceva with brain, bone mets s/p radiation and adrenal met who continues to have a dry cough with occasional hemoptysis. She states over the past 3 days she has had much more hemoptysis. She shows pictures of large amounts of bright red blood with clots in the sink. She admits to central chest pain which feels like burning. She states that she has an itchy throat and cough is almost all the time. She admits to shortness of breath with exertion but no complaints of feeling lightheaded or near syncopal when exerting herself. Currently she is not having any chest pain. She admits to noticing some bright red blood in her stool. No bloody vomitus.  ED Course: Pulmonary consult requested.  Review of Systems:  All other systems reviewed and apart from HPI, are negative.  Past Medical History  Diagnosis Date  . Pneumonia   . Hemoptysis   . Lung mass   . Hypokalemia   . Radiation 08/23/14-09/07/14    Brain/chest and left hip 30 Gy 12 Fx  . lung ca dx'd 07/2014  . Metastasis to brain ([place])   . Metastasis to adrenal gland ([place])   . Bone metastasis ([place])   . URI (upper respiratory infection) 02-02-2015    Past Surgical History  Procedure Laterality Date  . Video bronchoscopy Bilateral 08/09/2014    Procedure: VIDEO BRONCHOSCOPY WITHOUT FLUORO;  Surgeon: [name], MD;  Location: [facility];  Service: Cardiopulmonary;  Laterality: Bilateral;     reports that she has never smoked. She has never used smokeless tobacco. She reports that she does not drink alcohol or use illicit drugs. Lives at home with her husband and able to perform ADLs without trouble  Allergies  Allergen Reactions  . Other Other  (See Comments)    Patient states that there is some she takes that makes her throat "cry" AND SCRATCHY   . Doxycycline Nausea And Vomiting    No family history on file.   Prior to Admission medications   Medication Sig Start Date End Date Taking? Authorizing Provider  Calcium Carbonate-Vit D-Min (CALCIUM 600+D PLUS MINERALS) 600-400 MG-UNIT TABS Take 1 tablet by mouth 2 (two) times daily.   Yes Historical Provider, MD  clindamycin (CLINDAGEL) 1 % gel Apply topically 2 (two) times daily. 03/02/15  Yes [name], MD  erlotinib (TARCEVA) 100 MG tablet Take 1 tablet (100 mg total) by mouth daily. Take on an empty stomach 1 hour before meals or 2 hours after 05/09/15  Yes [name], MD  fluticasone [facility]) 50 MCG/ACT nasal spray Place 1 spray into both nostrils daily.   Yes Historical Provider, MD  guaiFENesin-codeine (CHERATUSSIN AC) 100-10 MG/5ML syrup Take 5 mLs by mouth 3 (three) times daily as needed for cough. 05/01/15  Yes Albertine Patricia, MD  HYDROcodone Bitartrate ER 20 MG CP12 Take 1 capsule by mouth 2 (two) times daily. Patient taking differently: Take 1 capsule by mouth 2 (two) times daily as needed (for pain).  03/31/15  Yes [name], MD  loratadine (CLARITIN) 10 MG tablet Take 1 tablet (10 mg total) by mouth daily. 05/26/15  Yes [name], MD  magic mouthwash w/lidocaine SOLN Take 5  mLs by mouth 3 (three) times daily as needed for mouth pain. 05/26/15  Yes [name], MD  mometasone-formoterol [facility]) 200-5 MCG/ACT AERO Inhale 2 puffs into the lungs 2 (two) times daily.   Yes Historical Provider, MD  prochlorperazine (COMPAZINE) 10 MG tablet Take 1 tablet (10 mg total) by mouth every 6 (six) hours as needed for nausea or vomiting. Patient not taking: Reported on 06/02/2015 12/07/14   [name], MD    Physical Exam: Filed Vitals:   06/02/15 1100 06/02/15 1200 06/02/15 1206 06/02/15 1414  BP: 112/65 107/66 107/66 103/67  Pulse: 100 101 104 112    Temp:      TempSrc:      Resp: '21 21 23 19  '$ Height:      Weight:      SpO2: 100% 98% 100% 100%      Constitutional: NAD, calm, comfortable Eyes: PERRLA, lids and conjunctivae normal-rash on her face ENMT: Mucous membranes are moist. Posterior pharynx clear of any exudate or lesions. Normal dentition.  Neck: normal, supple, no masses, no thyromegaly Respiratory: clear to auscultation bilaterally, no wheezing, no crackles. Normal respiratory effort. No accessory muscle use.  Cardiovascular: S1 & S2 heard, regular rate and rhythm, no murmurs / rubs / gallops. No extremity edema. 2+ pedal pulses. No carotid bruits.  Abdomen: No distension, no tenderness, no masses palpated. No hepatosplenomegaly. Bowel sounds normal.  Musculoskeletal: no clubbing / cyanosis. No joint deformity upper and lower extremities. Good ROM, no contractures. Normal muscle tone.  Skin: no rashes, lesions, ulcers. No induration Neurologic: CN 2-12 grossly intact. Sensation intact, DTR normal. Strength 5/5 in all 4 limbs.  Psychiatric: Normal judgment and insight. Alert and oriented x 3. Normal mood.     Labs on Admission: I have personally reviewed following labs and imaging studies  CBC:  Recent Labs Lab 06/02/15 1052  WBC 8.2  HGB 13.1  HCT 38.5  MCV 91.0  PLT 175*   Basic Metabolic Panel:  Recent Labs Lab 06/02/15 1052  NA 139  K 3.9  CL 107  CO2 27  GLUCOSE 95  BUN 8  CREATININE 0.49  CALCIUM 9.3   GFR: Estimated Creatinine Clearance: 64.3 mL/min (by C-G formula based on Cr of 0.49). Liver Function Tests: No results for input(s): AST, ALT, ALKPHOS, BILITOT, PROT, ALBUMIN in the last 168 hours. No results for input(s): LIPASE, AMYLASE in the last 168 hours. No results for input(s): AMMONIA in the last 168 hours. Coagulation Profile: No results for input(s): INR, PROTIME in the last 168 hours. Cardiac Enzymes: No results for input(s): CKTOTAL, CKMB, CKMBINDEX, TROPONINI in the last 168  hours. BNP (last 3 results) No results for input(s): PROBNP in the last 8760 hours. HbA1C: No results for input(s): HGBA1C in the last 72 hours. CBG: No results for input(s): GLUCAP in the last 168 hours. Lipid Profile: No results for input(s): CHOL, HDL, LDLCALC, TRIG, CHOLHDL, LDLDIRECT in the last 72 hours. Thyroid Function Tests: No results for input(s): TSH, T4TOTAL, FREET4, T3FREE, THYROIDAB in the last 72 hours. Anemia Panel: No results for input(s): VITAMINB12, FOLATE, FERRITIN, TIBC, IRON, RETICCTPCT in the last 72 hours. Urine analysis:    Component Value Date/Time   COLORURINE YELLOW 02/04/2015 1630   APPEARANCEUR CLEAR 02/04/2015 1630   LABSPEC 1.007 02/04/2015 1630   LABSPEC 1.025 09/24/2014 1053   PHURINE 7.5 02/04/2015 1630   PHURINE 6.0 09/24/2014 1053   GLUCOSEU NEGATIVE 02/04/2015 1630   GLUCOSEU Negative 09/24/2014 1053   HGBUR TRACE*  02/04/2015 1630   HGBUR Moderate 09/24/2014 1053   BILIRUBINUR NEGATIVE 02/04/2015 1630   BILIRUBINUR Negative 09/24/2014 1053   KETONESUR NEGATIVE 02/04/2015 1630   KETONESUR Negative 09/24/2014 1053   PROTEINUR NEGATIVE 02/04/2015 1630   PROTEINUR 100 09/24/2014 1053   UROBILINOGEN 0.2 09/24/2014 1053   UROBILINOGEN 0.2 08/13/2014 1059   NITRITE NEGATIVE 02/04/2015 1630   NITRITE Negative 09/24/2014 1053   LEUKOCYTESUR TRACE* 02/04/2015 1630   LEUKOCYTESUR Small 09/24/2014 1053   Sepsis Labs: '@LABRCNTIP'$ (procalcitonin:4,lacticidven:4) )No results found for this or any previous visit (from the past 240 hour(s)).   Radiological Exams on Admission: Dg Chest 2 View  06/02/2015  CLINICAL DATA:  Coughing up blood. History of lung cancer and brain metastasis. EXAM: CHEST  2 VIEW COMPARISON:  CT 04/30/2015, radiograph 02/15/2015 FINDINGS: Normal cardiac silhouette. Stable RIGHT perihilar mass like consolidation not changed comparison CT or radiograph. No pleural fluid. No pulmonary edema. No pneumothorax. IMPRESSION: 1. No  interval change. 2. Stable RIGHT perihilar masslike consolidation. Electronically Signed   By: Suzy Bouchard M.D.   On: 06/02/2015 10:04   Ct Angio Chest Pe W/cm &/or Wo Cm  06/02/2015  CLINICAL DATA:  45 year old with hemoptysis for 3 days with chest pain. History of lung cancer. Chemotherapy ongoing. EXAM: CT ANGIOGRAPHY CHEST WITH CONTRAST TECHNIQUE: Multidetector CT imaging of the chest was performed using the standard protocol during bolus administration of intravenous contrast. Multiplanar CT image reconstructions and MIPs were obtained to evaluate the vascular anatomy. CONTRAST:  80 ml Isovue-300. COMPARISON:  Radiographs 06/02/2015.  CT 04/30/2015. FINDINGS: Mediastinum: The pulmonary arteries are adequately, but not optimally opacified with contrast. There is no evidence of acute pulmonary embolism. The main pulmonary artery is dilated, consistent with pulmonary arterial hypertension. No significant systemic arterial abnormalities are identified. The heart size is normal. There is no pericardial effusion. There are no enlarged mediastinal, hilar or axillary lymph nodes. The thyroid gland, trachea and esophagus demonstrate no significant findings. Lungs/Pleura: There is no pleural effusion.There is stable bronchiectasis and right perihilar opacity extending into the right middle and lower lobes, attributed to prior radiation therapy. No recurrent mass lesion or endobronchial lesion identified. There is a stable calcified granuloma in the superior segment of the right lower lobe. The left lung is clear. Upper abdomen: The visualized upper abdomen appears stable without suspicious findings. Musculoskeletal/Chest wall: No chest wall lesion or acute osseous findings.There is stable sclerosis of the right humeral head, right clavicular head and sternal manubrium. Old fracture of the right sixth rib noted. Review of the MIP images confirms the above findings. IMPRESSION: 1. No evidence of acute pulmonary  embolism. 2. No clear explanation for hemoptysis identified. 3. Stable probable post radiation changes in the right perihilar region with architectural distortion and fibrosis. No specific evidence of local recurrence or metastatic disease. Given the patient's history, bronchoscopy may be warranted if there is persistent unexplained hemoptysis. 4. Stable treated osseous metastatic disease. Electronically Signed   By: Richardean Sale M.D.   On: 06/02/2015 12:06    EKG: Independently reviewed. On his rhythm at 91 bpm  Assessment/Plan Active Problems:   Hemoptysis -Appears quite severe with large amounts of bright red blood and clots-pulmonary critical care is evaluating her along with me and will decide on a bronchoscopy -Recent CT on 5/18 shows bronchiectasis and right perihilar opacity attributed to prior radiation therapy-no recurrent mass lesion -We'll add Tessalon to guaifenesin/codeine cough syrup -Continue long-acting hydrocodone for chest pain that she has been taking at home -  She is also taking Dulera and Claritin which will be continued     Non-small cell carcinoma of lung, stage 4 -with brain, adrenal and  Bone metastases -Continue Tarceva-she is receiving radiation as well      DVT prophylaxis: SCDs  Code Status: She would like to be a full code  Family Communication: Will likely need to contact her husband  Disposition Plan: 2-3 day hospital stay at the minimum  Consults called: Pulmonary critical care  Admission status: Admitted    [facility] MD [facility] Pager: [email or web address] Password [email or web address] 7PM-7AM, please contact night-coverage   06/02/2015, 3:06 PM

## 2015-06-02 NOTE — ED Provider Notes (Signed)
CSN: [id number]     Arrival date & time 06/02/15  0902 History   First MD Initiated Contact with Patient 06/02/15 [phone]     Chief Complaint  Patient presents with  . Hemoptysis  . cancer pt      (Consider location/radiation/quality/duration/timing/severity/associated sxs/prior Treatment) HPI  Patient with ongoing oral chemotherapy for metastatic non-small cell lung cancer since with 3 days of hemoptysis, dyspnea. Symptoms began without clear precipitant. Since onset symptoms of been persistent, now with increasingly red bloody sputum. Ongoing dyspnea, no syncope or There is mild chest pain with coughing, abdominal pain with coughing. No fever. Patient is taking Tarceva for her cancer. Patient has known metastases to her brain, bones, adrenals. Patient spoke with her oncologist today, was referred here for evaluation.  Past Medical History  Diagnosis Date  . Pneumonia   . Hemoptysis   . Lung mass   . Hypokalemia   . Radiation 08/23/14-09/07/14    Brain/chest and left hip 30 Gy 12 Fx  . lung ca dx'd 07/2014  . Metastasis to brain ([place])   . Metastasis to adrenal gland ([place])   . Bone metastasis ([place])   . URI (upper respiratory infection) 02-02-2015   Past Surgical History  Procedure Laterality Date  . Video bronchoscopy Bilateral 08/09/2014    Procedure: VIDEO BRONCHOSCOPY WITHOUT FLUORO;  Surgeon: [name], MD;  Location: [facility];  Service: Cardiopulmonary;  Laterality: Bilateral;   No family history on file. Social History  Substance Use Topics  . Smoking status: Never Smoker   . Smokeless tobacco: Never Used  . Alcohol Use: No   OB History    No data available     Review of Systems  Constitutional:       Per HPI, otherwise negative  HENT:       Per HPI, otherwise negative  Respiratory:       Per HPI, otherwise negative  Cardiovascular:       Per HPI, otherwise negative  Gastrointestinal: Negative for vomiting.  Endocrine:       Negative aside  from HPI  Genitourinary:       Neg aside from HPI   Musculoskeletal:       Per HPI, otherwise negative  Skin: Negative.   Allergic/Immunologic: Positive for immunocompromised state.  Neurological: Positive for weakness. Negative for syncope.      Allergies  Other and Doxycycline  Home Medications   Prior to Admission medications   Medication Sig Start Date End Date Taking? Authorizing Provider  Calcium Carbonate-Vit D-Min (CALCIUM 600+D PLUS MINERALS) 600-400 MG-UNIT TABS Take 1 tablet by mouth 2 (two) times daily.   Yes Historical Provider, MD  clindamycin (CLINDAGEL) 1 % gel Apply topically 2 (two) times daily. 03/02/15  Yes [name], MD  erlotinib (TARCEVA) 100 MG tablet Take 1 tablet (100 mg total) by mouth daily. Take on an empty stomach 1 hour before meals or 2 hours after 05/09/15  Yes [name], MD  fluticasone [facility]) 50 MCG/ACT nasal spray Place 1 spray into both nostrils daily.   Yes Historical Provider, MD  guaiFENesin-codeine (CHERATUSSIN AC) 100-10 MG/5ML syrup Take 5 mLs by mouth 3 (three) times daily as needed for cough. 05/01/15  Yes Albertine Patricia, MD  HYDROcodone Bitartrate ER 20 MG CP12 Take 1 capsule by mouth 2 (two) times daily. Patient taking differently: Take 1 capsule by mouth 2 (two) times daily as needed (for pain).  03/31/15  Yes [name], MD  loratadine (CLARITIN) 10 MG  tablet Take 1 tablet (10 mg total) by mouth daily. 05/26/15  Yes [name], MD  magic mouthwash w/lidocaine SOLN Take 5 mLs by mouth 3 (three) times daily as needed for mouth pain. 05/26/15  Yes [name], MD  mometasone-formoterol [facility]) 200-5 MCG/ACT AERO Inhale 2 puffs into the lungs 2 (two) times daily.   Yes Historical Provider, MD  prochlorperazine (COMPAZINE) 10 MG tablet Take 1 tablet (10 mg total) by mouth every 6 (six) hours as needed for nausea or vomiting. Patient not taking: Reported on 06/02/2015 12/07/14   [name], MD   BP 108/69 mmHg   Pulse 108  Temp(Src) 98.2 F (36.8 C) (Oral)  Resp 18  Ht 5' (1.524 m)  Wt 99 lb (44.906 kg)  BMI 19.33 kg/m2  SpO2 97% Physical Exam  Constitutional: She is oriented to person, place, and time. She appears well-developed and well-nourished. No distress.  HENT:  Head: Normocephalic and atraumatic.  Eyes: Conjunctivae and EOM are normal.  Cardiovascular: Regular rhythm.   Tachycardic  Pulmonary/Chest: Effort normal and breath sounds normal. No stridor. No respiratory distress.  Abdominal: She exhibits no distension.  Musculoskeletal: She exhibits no edema.  Neurological: She is alert and oriented to person, place, and time. No cranial nerve deficit.  Skin: Skin is warm and dry.  Psychiatric: She has a normal mood and affect.  Nursing note and vitals reviewed.  EMR notable for ongoing therapeutic efforts for metastatic non-small cell lung cancer, including oral Tarceva ED Course  Procedures (including critical care time) Labs Review Labs Reviewed  CBC - Abnormal; Notable for the following:    Platelets 411 (*)    All other components within normal limits  BASIC METABOLIC PANEL  I-STAT TROPOININ, ED    Imaging Review Dg Chest 2 View  06/02/2015  CLINICAL DATA:  Coughing up blood. History of lung cancer and brain metastasis. EXAM: CHEST  2 VIEW COMPARISON:  CT 04/30/2015, radiograph 02/15/2015 FINDINGS: Normal cardiac silhouette. Stable RIGHT perihilar mass like consolidation not changed comparison CT or radiograph. No pleural fluid. No pulmonary edema. No pneumothorax. IMPRESSION: 1. No interval change. 2. Stable RIGHT perihilar masslike consolidation. Electronically Signed   By: Suzy Bouchard M.D.   On: 06/02/2015 10:04   I have personally reviewed and evaluated these images and lab results as part of my medical decision-making.   EKG Interpretation   Date/Time:  Thursday Jun 02 2015 10:24:03 EDT Ventricular Rate:  91 PR Interval:  143 QRS Duration: 83 QT Interval:   356 QTC Calculation: 438 R Axis:   79 Text Interpretation:  Sinus rhythm RSR' in V1 or V2, right VCD or RVH No  significant change since last tracing Abnormal ekg Confirmed by Carmin Muskrat  MD [phone]) on 06/02/2015 10:43:24 AM     Patient with nasal cannula, 97%, abnormal Cardiac 105 sinus tach abnormal  2:28 PM On repeat exam the patient remains mildly tachycardic, but appears otherwise calm. I discussed all findings with her. I have discussed patient's case with our pulmonology team, hospitalist colleagues as well. Patient will be admitted.  MDM  45 year old female with metastatic non-small cell lung cancer including 2 brain, bone presents with hemoptysis for 3 days. Here the patient is awake and alert, afebrile, but mildly tachycardic, mildly tachypneic. No CT evidence for pulmonary embolism.  Patient received IVF, and after discussion w pulmonology, she was admitted for further E/M.   Carmin Muskrat, MD 06/02/15 1430

## 2015-06-03 ENCOUNTER — Encounter (HOSPITAL_COMMUNITY)
Admission: EM | Disposition: A | Discharge status: Home / Self Care | Payer: Self-pay | Source: Home / Self Care | Attending: Internal Medicine

## 2015-06-03 ENCOUNTER — Encounter (HOSPITAL_COMMUNITY): Payer: Self-pay

## 2015-06-03 DIAGNOSIS — C7951 Secondary malignant neoplasm of bone: Secondary | ICD-10-CM

## 2015-06-03 SURGERY — VIDEO BRONCHOSCOPY WITHOUT FLUORO
Anesthesia: Moderate Sedation | Laterality: Bilateral

## 2015-06-03 MED ORDER — ERLOTINIB HCL 100 MG PO TABS
100.0000 mg | ORAL_TABLET | Freq: Every day | ORAL | Status: DC
Start: 2015-06-03 — End: 2015-06-06
  Administered 2015-06-03 – 2015-06-06 (×4): 100 mg via ORAL
  Filled 2015-06-03 (×6): qty 1

## 2015-06-03 MED ORDER — METHYLPREDNISOLONE SODIUM SUCC 125 MG IJ SOLR
80.0000 mg | Freq: Four times a day (QID) | INTRAMUSCULAR | Status: DC
  Administered 2015-06-03 – 2015-06-06 (×12): 80 mg via INTRAVENOUS
  Filled 2015-06-03 (×12): qty 2

## 2015-06-03 MED ORDER — ZOLPIDEM TARTRATE 5 MG PO TABS
5.0000 mg | ORAL_TABLET | Freq: Once | ORAL | Status: AC
  Administered 2015-06-03: 5 mg via ORAL
  Filled 2015-06-03: qty 1

## 2015-06-03 NOTE — Progress Notes (Cosign Needed)
Discussed bronchoscopy with patient, consented for procedure.  Interpreter utilized for conversation.  During discussion, she brought up the concept of CPR / Intubation again.  She said she discussed with her family and they (husband/brother) want her to have CPR / mechanical ventilation.  Will change status back to full code.     Noe Gens, NP-C [facility] Pgr: [phone] or if no answer [phone] 06/03/2015, 10:28 AM

## 2015-06-03 NOTE — Progress Notes (Signed)
[facility]  Situation discussed with the patient. She ate a large breakfast this morning Currently no availability to to a bronchoscopy this afternoon  Case discussed with the patient at length using the interpreter this morning.  I have explained the details of the bronchoscopy to her at length, she was initially reluctant but she is willing to proceed now.  Plan: Increase dose of solumedrol to '80mg'$  q6h Plan bronchoscopy on Monday if no improvement with treatment of radiation fibrosis with solumedrol over weekend If dramatic worsening of hemoptysis, move to ICU and perform emergent bronchoscopy  On further discussion today she notes that her family has persuaded her to change her code status back to full code.  Roselie Awkward, MD [facility] Pager: [phone] Cell: [phone] After 3pm or if no response, call [phone]

## 2015-06-03 NOTE — Care Management Note (Signed)
Case Management Note  Patient Details  Name: Deborah Foster MRN: [id number] Date of Birth: 01/21/1970  Subjective/Objective:  45 y/o f admitted w/hemoptysis. Hx: NSCLC. Pulmonary following. From home.                  Action/Plan:d/c plan home.   Expected Discharge Date:   (UNKNOWN)               Expected Discharge Plan:  Home/Self Care  In-House Referral:     Discharge planning Services  CM Consult  Post Acute Care Choice:    Choice offered to:     DME Arranged:    DME Agency:     HH Arranged:    HH Agency:     Status of Service:  In process, will continue to follow  Medicare Important Message Given:    Date Medicare IM Given:    Medicare IM give by:    Date Additional Medicare IM Given:    Additional Medicare Important Message give by:     If discussed at [place] of Stay Meetings, dates discussed:    Additional Comments:  Dessa Phi, RN 06/03/2015, 1:42 PM

## 2015-06-03 NOTE — Progress Notes (Signed)
PROGRESS NOTE    Deborah Foster  ZOX:[id number] DOB: 05/10/1970 DOA: 06/02/2015  PCP: [name], MD   Brief Narrative:  Deborah Foster is a 45 y.o. female with medical history significant of non-small cell CA with RML lung mass on Tarceva with brain, bone mets s/p radiation and adrenal met who continues to have a dry cough with occasional hemoptysis. She states over the past 3 days she has had much more hemoptysis. She shows pictures of large amounts of bright red blood with clots in the sink. She admits to central chest pain which feels like burning. She states that she has an itchy throat and cough is almost all the time. She admits to shortness of breath with exertion but no complaints of feeling lightheaded or near syncopal when exerting herself. Currently she is not having any chest pain. She admits to noticing some bright red blood in her stool. No bloody vomitus.  Subjective: Vomiting and coughing up blood today.   Assessment & Plan:   Hemoptysis -Appears quite severe with large amounts of bright red blood and clots-pulmonary critical care is evaluating her along with me and will decide on a bronchoscopy -Recent CT on 5/18 shows bronchiectasis and right perihilar opacity attributed to prior radiation therapy-no recurrent mass lesion - added Tessalon to guaifenesin/codeine cough syrup -  hydrocodone for chest pain  - Dulera and Claritin   - pulmonary plans to treat this as radiation pneumonitis with IV steroids over the weekend    Non-small cell carcinoma of lung, stage 4 -with brain, adrenal and Bone metastases -Continue Tarceva-she is receiving radiation as well   DVT prophylaxis: SCDs Code Status: Full code Family Communication: husband Disposition Plan: watch over the weekend Consultants:   pulmonary Procedures:   none Antimicrobials:  Anti-infectives    None       Objective: Filed Vitals:   06/02/15 1615 06/02/15 2019 06/03/15 0445 06/03/15 1126  BP:  109/65 97/67 102/60   Pulse: 103 101 120   Temp: 98 F (36.7 C) 98 F (36.7 C) 98.4 F (36.9 C)   TempSrc: Oral Oral Oral   Resp: '20 20 22   '$ Height: 5' (1.524 m)     Weight: 44.453 kg (98 lb)     SpO2: 99% 99% 98% 98%    Intake/Output Summary (Last 24 hours) at 06/03/15 1235 Last data filed at 06/03/15 0836  Gross per 24 hour  Intake   1775 ml  Output      0 ml  Net   1775 ml   Filed Weights   06/02/15 0911 06/02/15 1615  Weight: 44.906 kg (99 lb) 44.453 kg (98 lb)    Examination: General exam: Appears comfortable  HEENT: PERRLA, oral mucosa moist, no sclera icterus or thrush- facial rash improved today Respiratory system: Clear to auscultation. Respiratory effort normal. Cardiovascular system: S1 & S2 heard, RRR.  No murmurs  Gastrointestinal system: Abdomen soft, non-tender, nondistended. Normal bowel sound. No organomegaly Central nervous system: Alert and oriented. No focal neurological deficits. Extremities: No cyanosis, clubbing or edema Skin: No rashes or ulcers Psychiatry:  Mood & affect appropriate.     Data Reviewed: I have personally reviewed following labs and imaging studies  CBC:  Recent Labs Lab 06/02/15 1052  WBC 8.2  HGB 13.1  HCT 38.5  MCV 91.0  PLT 811*   Basic Metabolic Panel:  Recent Labs Lab 06/02/15 1052  NA 139  K 3.9  CL 107  CO2 27  GLUCOSE 95  BUN 8  CREATININE 0.49  CALCIUM 9.3   GFR: Estimated Creatinine Clearance: 63.7 mL/min (by C-G formula based on Cr of 0.49). Liver Function Tests: No results for input(s): AST, ALT, ALKPHOS, BILITOT, PROT, ALBUMIN in the last 168 hours. No results for input(s): LIPASE, AMYLASE in the last 168 hours. No results for input(s): AMMONIA in the last 168 hours. Coagulation Profile: No results for input(s): INR, PROTIME in the last 168 hours. Cardiac Enzymes: No results for input(s): CKTOTAL, CKMB, CKMBINDEX, TROPONINI in the last 168 hours. BNP (last 3 results) No results for  input(s): PROBNP in the last 8760 hours. HbA1C: No results for input(s): HGBA1C in the last 72 hours. CBG: No results for input(s): GLUCAP in the last 168 hours. Lipid Profile: No results for input(s): CHOL, HDL, LDLCALC, TRIG, CHOLHDL, LDLDIRECT in the last 72 hours. Thyroid Function Tests: No results for input(s): TSH, T4TOTAL, FREET4, T3FREE, THYROIDAB in the last 72 hours. Anemia Panel: No results for input(s): VITAMINB12, FOLATE, FERRITIN, TIBC, IRON, RETICCTPCT in the last 72 hours. Urine analysis:    Component Value Date/Time   COLORURINE YELLOW 02/04/2015 1630   APPEARANCEUR CLEAR 02/04/2015 1630   LABSPEC 1.007 02/04/2015 1630   LABSPEC 1.025 09/24/2014 1053   PHURINE 7.5 02/04/2015 1630   PHURINE 6.0 09/24/2014 1053   GLUCOSEU NEGATIVE 02/04/2015 1630   GLUCOSEU Negative 09/24/2014 1053   HGBUR TRACE* 02/04/2015 1630   HGBUR Moderate 09/24/2014 1053   BILIRUBINUR NEGATIVE 02/04/2015 1630   BILIRUBINUR Negative 09/24/2014 1053   KETONESUR NEGATIVE 02/04/2015 1630   KETONESUR Negative 09/24/2014 1053   PROTEINUR NEGATIVE 02/04/2015 1630   PROTEINUR 100 09/24/2014 1053   UROBILINOGEN 0.2 09/24/2014 1053   UROBILINOGEN 0.2 08/13/2014 1059   NITRITE NEGATIVE 02/04/2015 1630   NITRITE Negative 09/24/2014 1053   LEUKOCYTESUR TRACE* 02/04/2015 1630   LEUKOCYTESUR Small 09/24/2014 1053   Sepsis Labs: '@LABRCNTIP'$ (procalcitonin:4,lacticidven:4) )No results found for this or any previous visit (from the past 240 hour(s)).       Radiology Studies: Dg Chest 2 View  06/02/2015  CLINICAL DATA:  Coughing up blood. History of lung cancer and brain metastasis. EXAM: CHEST  2 VIEW COMPARISON:  CT 04/30/2015, radiograph 02/15/2015 FINDINGS: Normal cardiac silhouette. Stable RIGHT perihilar mass like consolidation not changed comparison CT or radiograph. No pleural fluid. No pulmonary edema. No pneumothorax. IMPRESSION: 1. No interval change. 2. Stable RIGHT perihilar masslike  consolidation. Electronically Signed   By: Suzy Bouchard M.D.   On: 06/02/2015 10:04   Ct Angio Chest Pe W/cm &/or Wo Cm  06/02/2015  CLINICAL DATA:  45 year old with hemoptysis for 3 days with chest pain. History of lung cancer. Chemotherapy ongoing. EXAM: CT ANGIOGRAPHY CHEST WITH CONTRAST TECHNIQUE: Multidetector CT imaging of the chest was performed using the standard protocol during bolus administration of intravenous contrast. Multiplanar CT image reconstructions and MIPs were obtained to evaluate the vascular anatomy. CONTRAST:  80 ml Isovue-300. COMPARISON:  Radiographs 06/02/2015.  CT 04/30/2015. FINDINGS: Mediastinum: The pulmonary arteries are adequately, but not optimally opacified with contrast. There is no evidence of acute pulmonary embolism. The main pulmonary artery is dilated, consistent with pulmonary arterial hypertension. No significant systemic arterial abnormalities are identified. The heart size is normal. There is no pericardial effusion. There are no enlarged mediastinal, hilar or axillary lymph nodes. The thyroid gland, trachea and esophagus demonstrate no significant findings. Lungs/Pleura: There is no pleural effusion.There is stable bronchiectasis and right perihilar opacity extending into the right middle and lower lobes, attributed to  prior radiation therapy. No recurrent mass lesion or endobronchial lesion identified. There is a stable calcified granuloma in the superior segment of the right lower lobe. The left lung is clear. Upper abdomen: The visualized upper abdomen appears stable without suspicious findings. Musculoskeletal/Chest wall: No chest wall lesion or acute osseous findings.There is stable sclerosis of the right humeral head, right clavicular head and sternal manubrium. Old fracture of the right sixth rib noted. Review of the MIP images confirms the above findings. IMPRESSION: 1. No evidence of acute pulmonary embolism. 2. No clear explanation for hemoptysis  identified. 3. Stable probable post radiation changes in the right perihilar region with architectural distortion and fibrosis. No specific evidence of local recurrence or metastatic disease. Given the patient's history, bronchoscopy may be warranted if there is persistent unexplained hemoptysis. 4. Stable treated osseous metastatic disease. Electronically Signed   By: Richardean Sale M.D.   On: 06/02/2015 12:06      Scheduled Meds: . erlotinib  100 mg Oral Daily  . feeding supplement (ENSURE ENLIVE)  237 mL Oral BID BM  . fluticasone  1 spray Each Nare Daily  . loratadine  10 mg Oral Daily  . methylPREDNISolone (SOLU-MEDROL) injection  80 mg Intravenous Q6H  . mometasone-formoterol  2 puff Inhalation BID   Continuous Infusions:    LOS: 1 day    Time spent in minutes: 20    [place], MD [facility] Pager: [email or web address] Password [facility] 06/03/2015, 12:35 PM

## 2015-06-03 NOTE — Progress Notes (Addendum)
Initial Nutrition Assessment  DOCUMENTATION CODES:   Not applicable  INTERVENTION:  -Continue Ensure Enlive po BID, each supplement provides 350 kcal and 20 grams of protein -RD continue to monitor  NUTRITION DIAGNOSIS:   Increased nutrient needs related to cancer and cancer related treatments as evidenced by estimated needs.  GOAL:   Patient will meet greater than or equal to 90% of their needs  MONITOR:   PO intake, Supplement acceptance, Labs, I & O's, Skin  REASON FOR ASSESSMENT:   Malnutrition Screening Tool    ASSESSMENT:   Deborah Foster is a 45 y.o. female with medical history significant of non-small cell CA with RML lung mass on Tarceva with brain, bone mets s/p radiation and adrenal met who continues to have a dry cough with occasional hemoptysis  Utilized interpreter to speak to Ms. Garant at bedside. She endorses good appetite, very hungry. States she ate all of her breakfast this morning. She is unsure of weight loss.  Per chart -> 5#/4.8 insignificant wt loss over 3 months. Denies nausea at this time. Vomited earlier today some blood  -some hemotypsis  She doesn't appear terribly malnourished considering her malignancy status. Denies taste alterations at this time, though she has had issues with food tasting bland in the past.  Nutrition-Focused physical exam completed. Findings are moderate fat depletion, moderate muscle depletion, and no edema.   Usual wt prior to cancer was ~115# Previous note as of 07/2014  - wt was 114# ~ 16#/14% wt loss over 1.5 years She has consented to Bronch, but likely will not be able to have done today.  She is also a full code now.  Labs and Medications reviewed: Solumedrol; Morphine PRN   Diet Order:  Diet NPO time specified  Skin:  Reviewed, no issues  Last BM:  5/17  Height:   Ht Readings from Last 1 Encounters:  06/02/15 5' (1.524 m)    Weight:   Wt Readings from Last 1 Encounters:  06/02/15 98 lb (44.453 kg)     Ideal Body Weight:  45.45 kg  BMI:  Body mass index is 19.14 kg/(m^2).  Estimated Nutritional Needs:   Kcal:  1300-1500 (30-35 cal/kg)  Protein:  55-65 grams (1.2-1.5 cal/kg)  Fluid:  >/= 1.3L  EDUCATION NEEDS:   No education needs identified at this time  Satira Anis. Lemoyne Nestor, MS, RD LDN Inpatient Clinical Dietitian Pager [phone]

## 2015-06-03 NOTE — Progress Notes (Signed)
[facility]  S: still having hemoptysis and vomiting old blood, no dyspnea, couldn't sleep  O:  Filed Vitals:   06/02/15 1430 06/02/15 1615 06/02/15 2019 06/03/15 0445  BP: 114/70 109/65 97/67 102/60  Pulse: 109 103 101 120  Temp:  98 F (36.7 C) 98 F (36.7 C) 98.4 F (36.9 C)  TempSrc:  Oral Oral Oral  Resp: '28 20 20 22  '$ Height:  5' (1.524 m)    Weight:  98 lb (44.453 kg)    SpO2: 100% 99% 99% 98%   RA  Gen: chronically ill appearing HENT: OP clear, neck supple PULM: CTA B, normal percussion CV: RRR, no mgr, trace edema GI: BS+, soft, nontender Derm: no cyanosis or rash Psyche: normal mood and affect  CT chest images reviewed again, likely radiation fibrosis, Lung mass unchanged  Impression: Hemoptysis  Stage IV 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

## 2015-06-04 LAB — CBC
HCT: 34.5 % — ABNORMAL LOW (ref 36.0–46.0)
HEMOGLOBIN: 11.6 g/dL — AB (ref 12.0–15.0)
MCH: 31.1 pg (ref 26.0–34.0)
MCHC: 33.6 g/dL (ref 30.0–36.0)
MCV: 92.5 fL (ref 78.0–100.0)
Platelets: 401 10*3/uL — ABNORMAL HIGH (ref 150–400)
RBC: 3.73 MIL/uL — AB (ref 3.87–5.11)
RDW: 13.9 % (ref 11.5–15.5)
WBC: 16.8 10*3/uL — AB (ref 4.0–10.5)

## 2015-06-04 MED ORDER — ZOLPIDEM TARTRATE 5 MG PO TABS
5.0000 mg | ORAL_TABLET | Freq: Every evening | ORAL | Status: DC | PRN
  Administered 2015-06-04 – 2015-06-05 (×2): 5 mg via ORAL
  Filled 2015-06-04 (×2): qty 1

## 2015-06-04 MED ORDER — HYDROCOD POLST-CPM POLST ER 10-8 MG/5ML PO SUER
5.0000 mL | Freq: Two times a day (BID) | ORAL | Status: DC
  Administered 2015-06-04 – 2015-06-06 (×5): 5 mL via ORAL
  Filled 2015-06-04 (×5): qty 5

## 2015-06-04 NOTE — Progress Notes (Signed)
[facility]   This is a 45 year old female followed by Dr Lake Bells,  who is a nonsmoker who yet has metastatic adenocarcinoma. She started having a cough 1 week ago and has been coughing up bright red blood for the last 3 days. She denies shortness of breath, fevers, chills or sick contacts.  S: Still some hemoptysis and cough. Coughed up clot today. Feels trigger at [place]. Denies chest pain. Asks better Rx for cough. Has been walking halls.  O:  Filed Vitals:   06/03/15 1439 06/03/15 1943 06/03/15 2059 06/04/15 0500  BP: 102/56  104/61 97/64  Pulse: 115  105 70  Temp: 98.1 F (36.7 C)  97.7 F (36.5 C) 98.1 F (36.7 C)  TempSrc: Oral  Oral Oral  Resp: '20  18 18  '$ Height:      Weight:      SpO2: 100% 96% 99% 98%   RA  Gen: cheerful, greeting me by name as I entered. 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  Discussion: Needs better cough control  Plan: Will try Tussionex for more sustained cough control. May need to consider IR embolization or radiation again  CD Karin Pinedo, MD PCCM P:  [phone] After 3pm or if no response, call [phone]

## 2015-06-04 NOTE — Progress Notes (Addendum)
PROGRESS NOTE    Deborah Foster  OZD:[id number] DOB: 1970/06/08 DOA: 06/02/2015  PCP: [name], MD   Brief Narrative:  Deborah Foster is a 45 y.o. female with medical history significant of non-small cell CA with RML lung mass on Tarceva with brain, bone mets s/p radiation and adrenal met who continues to have a dry cough with occasional hemoptysis. She states over the past 3 days she has had much more hemoptysis. She shows pictures of large amounts of bright red blood with clots in the sink. She admits to central chest pain which feels like burning. She states that she has an itchy throat and cough is almost all the time. She admits to shortness of breath with exertion but no complaints of feeling lightheaded or near syncopal when exerting herself. Currently she is not having any chest pain. She admits to noticing some bright red blood in her stool. No bloody vomitus.  Subjective: Still coughing up blood butnot as bad as yesterday. Coughed up a large clot just now. Chest pan is not bad.   Assessment & Plan:   Hemoptysis -Appears quite severe with large amounts of bright red blood and clots-pulmonary critical care is evaluating her along with me and will decide on a bronchoscopy -Recent CT on 5/18 shows bronchiectasis and right perihilar opacity attributed to prior radiation therapy-no recurrent mass lesion - added Tessalon to guaifenesin/codeine cough syrup -  hydrocodone for chest pain  - Dulera and Claritin   - pulmonary plans to treat this as radiation pneumonitis with IV steroids over the weeken- cont IV steroids  Anemia- acute blood loss - Hb 14>>11 - anemia panel- follow   Non-small cell carcinoma of lung, stage 4 -with brain, adrenal and Bone metastases -Continue Tarceva- discussed with  Pulmonary - Dr Lake Bells feels this is not likely the cause of the hemoptysis   DVT prophylaxis: SCDs Code Status: Full code Family Communication: husband Disposition Plan: watch over  the weekend Consultants:   pulmonary Procedures:   none Antimicrobials:  Anti-infectives    None       Objective: Filed Vitals:   06/03/15 1439 06/03/15 1943 06/03/15 2059 06/04/15 0500  BP: 102/56  104/61 97/64  Pulse: 115  105 70  Temp: 98.1 F (36.7 C)  97.7 F (36.5 C) 98.1 F (36.7 C)  TempSrc: Oral  Oral Oral  Resp: _0 Height:      Weight:      SpO2: 100% 96% 99% 98%    Intake/Output Summary (Last 24 hours) at 06/04/15 1212 Last data filed at 06/04/15 0826  Gross per 24 hour  Intake   1955 ml  Output      0 ml  Net   1955 ml   Filed Weights   06/02/15 0911 06/02/15 1615  Weight: 44.906 kg (99 lb) 44.453 kg (98 lb)    Examination: General exam: Appears comfortable  HEENT: PERRLA, oral mucosa moist, no sclera icterus or thrush- facial rash improved today Respiratory system: Clear to auscultation. Respiratory effort normal. Cardiovascular system: S1 & S2 heard, RRR.  No murmurs  Gastrointestinal system: Abdomen soft, non-tender, nondistended. Normal bowel sound. No organomegaly Central nervous system: Alert and oriented. No focal neurological deficits. Extremities: No cyanosis, clubbing or edema Skin: No rashes or ulcers Psychiatry:  Mood & affect appropriate.     Data Reviewed: I have personally reviewed following labs and imaging studies  CBC:  Recent Labs Lab 06/02/15 1052 06/04/15 0338  WBC 8.2  16.8*  HGB 13.1 11.6*  HCT 38.5 34.5*  MCV 91.0 92.5  PLT 411* 284*   Basic Metabolic Panel:  Recent Labs Lab 06/02/15 1052  NA 139  K 3.9  CL 107  CO2 27  GLUCOSE 95  BUN 8  CREATININE 0.49  CALCIUM 9.3   GFR: Estimated Creatinine Clearance: 63.7 mL/min (by C-G formula based on Cr of 0.49). Liver Function Tests: No results for input(s): AST, ALT, ALKPHOS, BILITOT, PROT, ALBUMIN in the last 168 hours. No results for input(s): LIPASE, AMYLASE in the last 168 hours. No results for input(s): AMMONIA in the last 168  hours. Coagulation Profile: No results for input(s): INR, PROTIME in the last 168 hours. Cardiac Enzymes: No results for input(s): CKTOTAL, CKMB, CKMBINDEX, TROPONINI in the last 168 hours. BNP (last 3 results) No results for input(s): PROBNP in the last 8760 hours. HbA1C: No results for input(s): HGBA1C in the last 72 hours. CBG: No results for input(s): GLUCAP in the last 168 hours. Lipid Profile: No results for input(s): CHOL, HDL, LDLCALC, TRIG, CHOLHDL, LDLDIRECT in the last 72 hours. Thyroid Function Tests: No results for input(s): TSH, T4TOTAL, FREET4, T3FREE, THYROIDAB in the last 72 hours. Anemia Panel: No results for input(s): VITAMINB12, FOLATE, FERRITIN, TIBC, IRON, RETICCTPCT in the last 72 hours. Urine analysis:    Component Value Date/Time   COLORURINE YELLOW 02/04/2015 1630   APPEARANCEUR CLEAR 02/04/2015 1630   LABSPEC 1.007 02/04/2015 1630   LABSPEC 1.025 09/24/2014 1053   PHURINE 7.5 02/04/2015 1630   PHURINE 6.0 09/24/2014 1053   GLUCOSEU NEGATIVE 02/04/2015 1630   GLUCOSEU Negative 09/24/2014 1053   HGBUR TRACE* 02/04/2015 1630   HGBUR Moderate 09/24/2014 1053   BILIRUBINUR NEGATIVE 02/04/2015 1630   BILIRUBINUR Negative 09/24/2014 1053   KETONESUR NEGATIVE 02/04/2015 1630   KETONESUR Negative 09/24/2014 1053   PROTEINUR NEGATIVE 02/04/2015 1630   PROTEINUR 100 09/24/2014 1053   UROBILINOGEN 0.2 09/24/2014 1053   UROBILINOGEN 0.2 08/13/2014 1059   NITRITE NEGATIVE 02/04/2015 1630   NITRITE Negative 09/24/2014 1053   LEUKOCYTESUR TRACE* 02/04/2015 1630   LEUKOCYTESUR Small 09/24/2014 1053   Sepsis Labs: _0 (procalcitonin:4,lacticidven:4) )No results found for this or any previous visit (from the past 240 hour(s)).       Radiology Studies: No results found.    Scheduled Meds: . chlorpheniramine-HYDROcodone  5 mL Oral Q12H  . erlotinib  100 mg Oral Daily  . feeding supplement (ENSURE ENLIVE)  237 mL Oral BID BM  . fluticasone  1  spray Each Nare Daily  . loratadine  10 mg Oral Daily  . methylPREDNISolone (SOLU-MEDROL) injection  80 mg Intravenous Q6H  . mometasone-formoterol  2 puff Inhalation BID   Continuous Infusions:    LOS: 2 days    Time spent in minutes: 42    Alim Cattell, MD [facility] Pager: [email or web address] Password [email or web address] 06/04/2015, 12:12 PM

## 2015-06-05 DIAGNOSIS — E538 Deficiency of other specified B group vitamins: Secondary | ICD-10-CM

## 2015-06-05 DIAGNOSIS — D638 Anemia in other chronic diseases classified elsewhere: Secondary | ICD-10-CM

## 2015-06-05 LAB — RETICULOCYTES
RBC.: 3.66 MIL/uL — ABNORMAL LOW (ref 3.87–5.11)
RETIC COUNT ABSOLUTE: 87.8 10*3/uL (ref 19.0–186.0)
Retic Ct Pct: 2.4 % (ref 0.4–3.1)

## 2015-06-05 LAB — FERRITIN: Ferritin: 34 ng/mL (ref 11–307)

## 2015-06-05 LAB — IRON AND TIBC
IRON: 32 ug/dL (ref 28–170)
SATURATION RATIOS: 10 % — AB (ref 10.4–31.8)
TIBC: 332 ug/dL (ref 250–450)
UIBC: 300 ug/dL

## 2015-06-05 LAB — VITAMIN B12: VITAMIN B 12: 213 pg/mL (ref 180–914)

## 2015-06-05 LAB — FOLATE: FOLATE: 5.5 ng/mL — AB (ref >5.9)

## 2015-06-05 MED ORDER — FOLIC ACID 1 MG PO TABS
1.0000 mg | ORAL_TABLET | Freq: Every day | ORAL | Status: DC
  Administered 2015-06-05 – 2015-06-06 (×2): 1 mg via ORAL
  Filled 2015-06-05 (×2): qty 1

## 2015-06-05 MED ORDER — VITAMIN B-12 1000 MCG PO TABS
1000.0000 ug | ORAL_TABLET | Freq: Every day | ORAL | Status: DC
  Administered 2015-06-05 – 2015-06-06 (×2): 1000 ug via ORAL
  Filled 2015-06-05 (×2): qty 1

## 2015-06-05 MED ORDER — FERROUS SULFATE 300 (60 FE) MG/5ML PO SYRP
220.0000 mg | ORAL_SOLUTION | Freq: Three times a day (TID) | ORAL | Status: DC
  Administered 2015-06-05 – 2015-06-06 (×3): 220 mg via ORAL
  Filled 2015-06-05 (×6): qty 5

## 2015-06-05 NOTE — Progress Notes (Signed)
PROGRESS NOTE    Deborah Foster  IOE:[id number] DOB: [birth year] DOA: 06/02/2015  PCP: [name], MD   Brief Narrative:  Deborah Foster is a 45 y.o. female with medical history significant of non-small cell CA with RML lung mass on Tarceva with brain, bone mets s/p radiation and adrenal met who continues to have a dry cough with occasional hemoptysis. She states over the past 3 days she has had much more hemoptysis. She shows pictures of large amounts of bright red blood with clots in the sink. She admits to central chest pain which feels like burning. She states that she has an itchy throat and cough is almost all the time. She admits to shortness of breath with exertion but no complaints of feeling lightheaded or near syncopal when exerting herself. Currently she is not having any chest pain. She admits to noticing some bright red blood in her stool. No bloody vomitus.  Subjective: Hemoptysis nearly resolved. No dyspnea. No chest pain but feels "dry"- points to sternum.   Assessment & Plan:   Hemoptysis -Appears quite severe with large amounts of bright red blood and clots-pulmonary critical care is evaluating her along with me and will decide on a bronchoscopy -Recent CT on 5/18 shows bronchiectasis and right perihilar opacity attributed to prior radiation therapy-no recurrent mass lesion - added Tessalon to guaifenesin/codeine cough syrup -  hydrocodone for chest pain  - Dulera and Claritin   - pulmonary plans to treat this as radiation pneumonitis with IV steroids over the weekend-   Anemia- acute blood loss - Hb 14>>11 - anemia panel shows very mild folic acid deficiency and low normal B12 and Iron levels- will replace all 3 - follow   Non-small cell carcinoma of lung, stage 4 -with brain, adrenal and Bone metastases -Continue Tarceva- discussed with  Pulmonary - Dr Lake Bells feels this is not likely the cause of the hemoptysis   DVT prophylaxis: SCDs Code Status: Full  code Family Communication: husband Disposition Plan: watch over the weekend Consultants:   pulmonary Procedures:   none Antimicrobials:  Anti-infectives    None       Objective: Filed Vitals:   06/04/15 1403 06/04/15 1926 06/04/15 2108 06/05/15 0415  BP: 102/59  93/60 105/68  Pulse: 100  83 92  Temp: 97.8 F (36.6 C)  97.9 F (36.6 C) 97.8 F (36.6 C)  TempSrc: Oral  Oral Oral  Resp: '20  18 16  '$ Height:      Weight:      SpO2: 98% 99% 99% 99%    Intake/Output Summary (Last 24 hours) at 06/05/15 1027 Last data filed at 06/05/15 0415  Gross per 24 hour  Intake   1120 ml  Output      0 ml  Net   1120 ml   Filed Weights   06/02/15 0911 06/02/15 1615  Weight: 44.906 kg (99 lb) 44.453 kg (98 lb)    Examination: General exam: Appears comfortable  HEENT: PERRLA, oral mucosa moist, no sclera icterus or thrush- facial rash improved   Respiratory system: Clear to auscultation. Respiratory effort normal. Cardiovascular system: S1 & S2 heard, RRR.  No murmurs  Gastrointestinal system: Abdomen soft, non-tender, nondistended. Normal bowel sound. No organomegaly Central nervous system: Alert and oriented. No focal neurological deficits. Extremities: No cyanosis, clubbing or edema Skin: No rashes or ulcers Psychiatry:  Mood & affect appropriate.     Data Reviewed: I have personally reviewed following labs and imaging studies  CBC:  Recent Labs Lab 06/02/15 1052 06/04/15 0338  WBC 8.2 16.8*  HGB 13.1 11.6*  HCT 38.5 34.5*  MCV 91.0 92.5  PLT 411* 962*   Basic Metabolic Panel:  Recent Labs Lab 06/02/15 1052  NA 139  K 3.9  CL 107  CO2 27  GLUCOSE 95  BUN 8  CREATININE 0.49  CALCIUM 9.3   GFR: Estimated Creatinine Clearance: 63.7 mL/min (by C-G formula based on Cr of 0.49). Liver Function Tests: No results for input(s): AST, ALT, ALKPHOS, BILITOT, PROT, ALBUMIN in the last 168 hours. No results for input(s): LIPASE, AMYLASE in the last 168  hours. No results for input(s): AMMONIA in the last 168 hours. Coagulation Profile: No results for input(s): INR, PROTIME in the last 168 hours. Cardiac Enzymes: No results for input(s): CKTOTAL, CKMB, CKMBINDEX, TROPONINI in the last 168 hours. BNP (last 3 results) No results for input(s): PROBNP in the last 8760 hours. HbA1C: No results for input(s): HGBA1C in the last 72 hours. CBG: No results for input(s): GLUCAP in the last 168 hours. Lipid Profile: No results for input(s): CHOL, HDL, LDLCALC, TRIG, CHOLHDL, LDLDIRECT in the last 72 hours. Thyroid Function Tests: No results for input(s): TSH, T4TOTAL, FREET4, T3FREE, THYROIDAB in the last 72 hours. Anemia Panel:  Recent Labs  06/05/15 0401  VITAMINB12 213  FOLATE 5.5*  FERRITIN 34  TIBC 332  IRON 32  RETICCTPCT 2.4   Urine analysis:    Component Value Date/Time   COLORURINE YELLOW 02/04/2015 1630   APPEARANCEUR CLEAR 02/04/2015 1630   LABSPEC 1.007 02/04/2015 1630   LABSPEC 1.025 09/24/2014 1053   PHURINE 7.5 02/04/2015 1630   PHURINE 6.0 09/24/2014 1053   GLUCOSEU NEGATIVE 02/04/2015 1630   GLUCOSEU Negative 09/24/2014 1053   HGBUR TRACE* 02/04/2015 1630   HGBUR Moderate 09/24/2014 1053   BILIRUBINUR NEGATIVE 02/04/2015 1630   BILIRUBINUR Negative 09/24/2014 1053   KETONESUR NEGATIVE 02/04/2015 1630   KETONESUR Negative 09/24/2014 1053   PROTEINUR NEGATIVE 02/04/2015 1630   PROTEINUR 100 09/24/2014 1053   UROBILINOGEN 0.2 09/24/2014 1053   UROBILINOGEN 0.2 08/13/2014 1059   NITRITE NEGATIVE 02/04/2015 1630   NITRITE Negative 09/24/2014 1053   LEUKOCYTESUR TRACE* 02/04/2015 1630   LEUKOCYTESUR Small 09/24/2014 1053   Sepsis Labs: '@LABRCNTIP'$ (procalcitonin:4,lacticidven:4) )No results found for this or any previous visit (from the past 240 hour(s)).       Radiology Studies: No results found.    Scheduled Meds: . chlorpheniramine-HYDROcodone  5 mL Oral Q12H  . erlotinib  100 mg Oral Daily  .  feeding supplement (ENSURE ENLIVE)  237 mL Oral BID BM  . fluticasone  1 spray Each Nare Daily  . loratadine  10 mg Oral Daily  . methylPREDNISolone (SOLU-MEDROL) injection  80 mg Intravenous Q6H  . mometasone-formoterol  2 puff Inhalation BID   Continuous Infusions:    LOS: 3 days    Time spent in minutes: 19    Jacion Dismore, MD [facility] Pager: [email or web address] Password [facility] 06/05/2015, 10:27 AM

## 2015-06-05 NOTE — Progress Notes (Signed)
[facility] PCCM   This is a 45 year old female nonsmoker followed by Dr Lake Bells,  who  has metastatic adenocarcinoma. She started having a cough 1 week ago and has been coughing up bright red blood for the last 3 days PTA. She denies shortness of breath, fevers, chills or sick contacts.  S: Liked first dose of tussionex so far. Thinks cough and heme may be a little better.  ODanley Danker Vitals:   06/04/15 1403 06/04/15 1926 06/04/15 2108 06/05/15 0415  BP: 102/59  93/60 105/68  Pulse: 100  83 92  Temp: 97.8 F (36.6 C)  97.9 F (36.6 C) 97.8 F (36.6 C)  TempSrc: Oral  Oral Oral  Resp: '20  18 16  '$ Height:      Weight:      SpO2: 98% 99% 99% 99%     Obj- Gen: cheerful,  HENT: OP clear, neck supple PULM: CTA B, normal percussion, dry cough with deep breath CV: RRR, no mgr, trace edema GI: BS+, soft, nontender Derm: no cyanosis or rash Psyche: normal mood and affect  CT chest images reviewed , likely radiation fibrosis, Lung mass unchanged  Impression: Hemoptysis  Stage IV adenocarcinoma 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  Discussion: Needs better cough control. Hopefully Tussionex will help with this. She demonstrated ineffective metered inhaler use of Dulera- I educated on technique and mouth rinse  Plan: Trying Tussionex for more sustained cough control. Any sips, throat lozenges and other cough aides are fine as well. May need to consider IR embolization or radiation again if hemoptysis doesn't stop.  CD Young, MD PCCM P:  [phone] After 3pm or if no response, call [phone]

## 2015-06-06 ENCOUNTER — Encounter (HOSPITAL_COMMUNITY)
Admission: EM | Disposition: A | Discharge status: Home / Self Care | Payer: Self-pay | Source: Home / Self Care | Attending: Internal Medicine

## 2015-06-06 ENCOUNTER — Encounter (HOSPITAL_COMMUNITY): Payer: Self-pay

## 2015-06-06 DIAGNOSIS — J700 Acute pulmonary manifestations due to radiation: Principal | ICD-10-CM

## 2015-06-06 DIAGNOSIS — E538 Deficiency of other specified B group vitamins: Secondary | ICD-10-CM

## 2015-06-06 LAB — CBC
HCT: 33 % — ABNORMAL LOW (ref 36.0–46.0)
Hemoglobin: 11.2 g/dL — ABNORMAL LOW (ref 12.0–15.0)
MCH: 31.4 pg (ref 26.0–34.0)
MCHC: 33.9 g/dL (ref 30.0–36.0)
MCV: 92.4 fL (ref 78.0–100.0)
PLATELETS: 368 10*3/uL (ref 150–400)
RBC: 3.57 MIL/uL — ABNORMAL LOW (ref 3.87–5.11)
RDW: 14.2 % (ref 11.5–15.5)
WBC: 11.7 10*3/uL — AB (ref 4.0–10.5)

## 2015-06-06 SURGERY — VIDEO BRONCHOSCOPY WITHOUT FLUORO
Anesthesia: Moderate Sedation | Laterality: Bilateral

## 2015-06-06 MED ORDER — HYDROCOD POLST-CPM POLST ER 10-8 MG/5ML PO SUER: 5.0000 mL | Freq: Two times a day (BID) | ORAL | Status: DC | PRN

## 2015-06-06 MED ORDER — HYDROCODONE BITARTRATE ER 20 MG PO CP12: 1.0000 | ORAL_CAPSULE | Freq: Two times a day (BID) | ORAL | Status: DC | PRN

## 2015-06-06 MED ORDER — BENZONATATE 100 MG PO CAPS: 100.0000 mg | ORAL_CAPSULE | Freq: Three times a day (TID) | ORAL | Status: DC | PRN

## 2015-06-06 MED ORDER — PREDNISONE 20 MG PO TABS: 40.0000 mg | ORAL_TABLET | Freq: Every day | ORAL | Status: DC

## 2015-06-06 MED ORDER — FOLIC ACID 1 MG PO TABS: 1.0000 mg | ORAL_TABLET | Freq: Every day | ORAL | Status: DC

## 2015-06-06 MED ORDER — BENZONATATE 100 MG PO CAPS
100.0000 mg | ORAL_CAPSULE | Freq: Three times a day (TID) | ORAL | Status: DC
  Administered 2015-06-06: 100 mg via ORAL
  Filled 2015-06-06 (×2): qty 1

## 2015-06-06 MED ORDER — CYANOCOBALAMIN 1000 MCG PO TABS: 1000.0000 ug | ORAL_TABLET | Freq: Every day | ORAL | Status: DC

## 2015-06-06 MED ORDER — FERROUS SULFATE 300 (60 FE) MG/5ML PO SYRP: 220.0000 mg | ORAL_SOLUTION | Freq: Two times a day (BID) | ORAL | Status: DC

## 2015-06-06 MED ORDER — PREDNISONE 10 MG PO TABS: ORAL_TABLET | ORAL | Status: DC

## 2015-06-06 MED FILL — predniSONE 10 MG TABS: 10 | 25 days supply | Qty: 53 | Fill #0

## 2015-06-06 MED FILL — BENZONATATE 100 MG CAPSULE: 100 | 6 days supply | Qty: 20 | Fill #0

## 2015-06-06 MED FILL — FOLIC ACID 1 MG TABLET: 1 | 30 days supply | Qty: 30 | Fill #0

## 2015-06-06 NOTE — Discharge Summary (Addendum)
Physician Discharge Summary  KENNETHA PEARMAN OIN:[id number] DOB: 02/14/1970 DOA: 06/02/2015  PCP: [name], MD  Admit date: 06/02/2015 Discharge date: 06/06/2015  Time spent: 55 minutes  Recommendations for Outpatient Follow-up:  1. Will f/u with PCCM as outpt  Discharge Condition: stable    Discharge Diagnoses:  Principal Problem:   Radiation pneumonitis with hemoptysis Active Problems:   Adrenal mass, left (HCC)   Non-small cell carcinoma of lung, stage 4 (HCC)   Bone metastases (HCC)   Folic acid deficiency   B12 deficiency Facial rash  Acute blood loss anemia  History of present illness:  Deborah Foster is a 45 y.o. female with medical history significant of non-small cell CA with RML lung mass on Tarceva with brain, bone mets s/p radiation and adrenal met who continues to have a dry cough with occasional hemoptysis. She states over the past 3 days she has had much more hemoptysis. She shows pictures of large amounts of bright red blood with clots in the sink. She admits to central chest pain which feels like burning. She states that she has an itchy throat and cough is almost all the time. She admits to shortness of breath with exertion but no complaints of feeling lightheaded or near syncopal when exerting herself. Currently she is not having any chest pain. She admits to noticing some bright red blood in her stool. No bloody vomitus.  Hospital Course:  Hemoptysis- radiation pneumonitis -  quite severe with large amounts of bright red blood and clots-pulmonary critical care is evaluating her along with me and will decide on a bronchoscopy -Recent CT on 5/18 shows bronchiectasis and right perihilar opacity attributed to prior radiation therapy-no recurrent mass lesion - Dulera and Claritin  - resolved with IV steroids over the weekend- pulm recommends slow pred taper; start at 40 mg and decrease by 10 mg every 5d - cont Tussionex and Tessalon  Anemia- acute blood loss -  Hb 14>>11 - anemia panel shows very mild folic acid deficiency and low normal B12 and Iron levels- will replace all 3 - follow   Non-small cell carcinoma of lung, stage 4 -with brain, adrenal andbone metastases -Continue Tarceva- discussed with Pulmonary - Dr Lake Bells feels this is not likely the cause of the hemoptysis  Facial rash - suspected by oncology to be from Tarceva    Procedures:  none  Consultations:  pulmonary  Discharge Exam: Filed Weights   06/02/15 0911 06/02/15 1615  Weight: 44.906 kg (99 lb) 44.453 kg (98 lb)   Filed Vitals:   06/06/15 0502 06/06/15 0504  BP: 100/37 92/49  Pulse: 90   Temp: 97.9 F (36.6 C)   Resp: 16     General: AAO x 3, no distress Cardiovascular: RRR, no murmurs  Respiratory: clear to auscultation bilaterally GI: soft, non-tender, non-distended, bowel sound positive  Discharge Instructions You were cared for by a hospitalist during your hospital stay. If you have any questions about your discharge medications or the care you received while you were in the hospital after you are discharged, you can call the unit and asked to speak with the hospitalist on call if the hospitalist that took care of you is not available. Once you are discharged, your primary care physician will handle any further medical issues. Please note that NO REFILLS for any discharge medications will be authorized once you are discharged, as it is imperative that you return to your primary care physician (or establish a relationship with a primary care  physician if you do not have one) for your aftercare needs so that they can reassess your need for medications and monitor your lab values.      Discharge Instructions    Discharge instructions    Complete by:  As directed   Regular diet     Increase activity slowly    Complete by:  As directed             Medication List    STOP taking these medications        guaiFENesin-codeine 100-10 MG/5ML syrup   Commonly known as:  CHERATUSSIN AC      TAKE these medications        benzonatate 100 MG capsule  Commonly known as:  TESSALON  Take 1 capsule (100 mg total) by mouth 3 (three) times daily as needed for cough.     CALCIUM 600+D PLUS MINERALS 600-400 MG-UNIT Tabs  Take 1 tablet by mouth 2 (two) times daily.     chlorpheniramine-HYDROcodone 10-8 MG/5ML Suer  Commonly known as:  TUSSIONEX  Take 5 mLs by mouth every 12 (twelve) hours as needed for cough.     clindamycin 1 % gel  Commonly known as:  CLINDAGEL  Apply topically 2 (two) times daily.     cyanocobalamin 1000 MCG tablet  Take 1 tablet (1,000 mcg total) by mouth daily.     erlotinib 100 MG tablet  Commonly known as:  TARCEVA  Take 1 tablet (100 mg total) by mouth daily. Take on an empty stomach 1 hour before meals or 2 hours after     ferrous sulfate 300 (60 Fe) MG/5ML syrup  Take 3.7 mLs (220 mg total) by mouth 2 (two) times daily with a meal.     fluticasone 50 MCG/ACT nasal spray  Commonly known as:  FLONASE  Place 1 spray into both nostrils daily.     folic acid 1 MG tablet  Commonly known as:  FOLVITE  Take 1 tablet (1 mg total) by mouth daily.     HYDROcodone Bitartrate ER 20 MG Cp12  Take 1 capsule by mouth 2 (two) times daily as needed (for pain).     loratadine 10 MG tablet  Commonly known as:  CLARITIN  Take 1 tablet (10 mg total) by mouth daily.     magic mouthwash w/lidocaine Soln  Take 5 mLs by mouth 3 (three) times daily as needed for mouth pain.     mometasone-formoterol 200-5 MCG/ACT Aero  Commonly known as:  DULERA  Inhale 2 puffs into the lungs 2 (two) times daily.     predniSONE 10 MG tablet  Commonly known as:  DELTASONE  Start at 40 mg and taper by 10 mg every 5 days.       Allergies  Allergen Reactions  . Other Other (See Comments)    Patient states that there is some she takes that makes her throat "cry" AND SCRATCHY   . Doxycycline Nausea And Vomiting   Follow-up  Information    Follow up with Rexene Edison, NP On 06/20/2015.   Specialty:  Pulmonary Disease   Why:  at 930am    Contact information:   [address] [address] [phone]        The results of significant diagnostics from this hospitalization (including imaging, microbiology, ancillary and laboratory) are listed below for reference.    Significant Diagnostic Studies: Dg Chest 2 View  06/02/2015  CLINICAL DATA:  Coughing up blood. History of  lung cancer and brain metastasis. EXAM: CHEST  2 VIEW COMPARISON:  CT 04/30/2015, radiograph 02/15/2015 FINDINGS: Normal cardiac silhouette. Stable RIGHT perihilar mass like consolidation not changed comparison CT or radiograph. No pleural fluid. No pulmonary edema. No pneumothorax. IMPRESSION: 1. No interval change. 2. Stable RIGHT perihilar masslike consolidation. Electronically Signed   By: Suzy Bouchard M.D.   On: 06/02/2015 10:04   Ct Angio Chest Pe W/cm &/or Wo Cm  06/02/2015  CLINICAL DATA:  45 year old with hemoptysis for 3 days with chest pain. History of lung cancer. Chemotherapy ongoing. EXAM: CT ANGIOGRAPHY CHEST WITH CONTRAST TECHNIQUE: Multidetector CT imaging of the chest was performed using the standard protocol during bolus administration of intravenous contrast. Multiplanar CT image reconstructions and MIPs were obtained to evaluate the vascular anatomy. CONTRAST:  80 ml Isovue-300. COMPARISON:  Radiographs 06/02/2015.  CT 04/30/2015. FINDINGS: Mediastinum: The pulmonary arteries are adequately, but not optimally opacified with contrast. There is no evidence of acute pulmonary embolism. The main pulmonary artery is dilated, consistent with pulmonary arterial hypertension. No significant systemic arterial abnormalities are identified. The heart size is normal. There is no pericardial effusion. There are no enlarged mediastinal, hilar or axillary lymph nodes. The thyroid gland, trachea and esophagus demonstrate no  significant findings. Lungs/Pleura: There is no pleural effusion.There is stable bronchiectasis and right perihilar opacity extending into the right middle and lower lobes, attributed to prior radiation therapy. No recurrent mass lesion or endobronchial lesion identified. There is a stable calcified granuloma in the superior segment of the right lower lobe. The left lung is clear. Upper abdomen: The visualized upper abdomen appears stable without suspicious findings. Musculoskeletal/Chest wall: No chest wall lesion or acute osseous findings.There is stable sclerosis of the right humeral head, right clavicular head and sternal manubrium. Old fracture of the right sixth rib noted. Review of the MIP images confirms the above findings. IMPRESSION: 1. No evidence of acute pulmonary embolism. 2. No clear explanation for hemoptysis identified. 3. Stable probable post radiation changes in the right perihilar region with architectural distortion and fibrosis. No specific evidence of local recurrence or metastatic disease. Given the patient's history, bronchoscopy may be warranted if there is persistent unexplained hemoptysis. 4. Stable treated osseous metastatic disease. Electronically Signed   By: Richardean Sale M.D.   On: 06/02/2015 12:06    Microbiology: No results found for this or any previous visit (from the past 240 hour(s)).   Labs: Basic Metabolic Panel:  Recent Labs Lab 06/02/15 1052  NA 139  K 3.9  CL 107  CO2 27  GLUCOSE 95  BUN 8  CREATININE 0.49  CALCIUM 9.3   Liver Function Tests: No results for input(s): AST, ALT, ALKPHOS, BILITOT, PROT, ALBUMIN in the last 168 hours. No results for input(s): LIPASE, AMYLASE in the last 168 hours. No results for input(s): AMMONIA in the last 168 hours. CBC:  Recent Labs Lab 06/02/15 1052 06/04/15 0338 06/06/15 0356  WBC 8.2 16.8* 11.7*  HGB 13.1 11.6* 11.2*  HCT 38.5 34.5* 33.0*  MCV 91.0 92.5 92.4  PLT 411* 401* 368   Cardiac  Enzymes: No results for input(s): CKTOTAL, CKMB, CKMBINDEX, TROPONINI in the last 168 hours. BNP: BNP (last 3 results) No results for input(s): BNP in the last 8760 hours.  ProBNP (last 3 results) No results for input(s): PROBNP in the last 8760 hours.  CBG: No results for input(s): GLUCAP in the last 168 hours.     SignedDebbe Odea, MD [facility] 06/06/2015, 1:25 PM

## 2015-06-06 NOTE — Progress Notes (Signed)
  This is a 45 year old female nonsmoker followed by Dr Lake Bells,  who  has metastatic adenocarcinoma. She started having a cough 1 week ago and has been coughing up bright red blood for the last 3 days PTA. She denies shortness of breath, fevers, chills or sick contacts.  S:  Feels better.  O:  BP 92/49 mmHg  Pulse 90  Temp(Src) 97.9 F (36.6 C) (Oral)  Resp 16  Ht 5' (1.524 m)  Wt 98 lb (44.453 kg)  BMI 19.14 kg/m2  SpO2 100% Room air  Obj- Gen: cheerful, no distress HENT: OP clear, neck supple PULM: CTA B, normal percussion, dry cough with deep breath CV: RRR, no mgr, trace edema GI: BS+, soft, nontender Derm: no cyanosis or rash Psyche: normal mood and affect CBC Recent Labs     06/04/15  0338  06/06/15  0356  WBC  16.8*  11.7*  HGB  11.6*  11.2*  HCT  34.5*  33.0*  PLT  401*  368    Coag's No results for input(s): APTT, INR in the last 72 hours.  BMET No results for input(s): NA, K, CL, CO2, BUN, CREATININE, GLUCOSE in the last 72 hours.  Electrolytes No results for input(s): CALCIUM, MG, PHOS in the last 72 hours.  Sepsis Markers No results for input(s): PROCALCITON, O2SATVEN in the last 72 hours.  Invalid input(s): LACTICACIDVEN  ABG No results for input(s): PHART, PCO2ART, PO2ART in the last 72 hours.  Liver Enzymes No results for input(s): AST, ALT, ALKPHOS, BILITOT, ALBUMIN in the last 72 hours.  Cardiac Enzymes No results for input(s): TROPONINI, PROBNP in the last 72 hours.  Glucose No results for input(s): GLUCAP in the last 72 hours.  Imaging No results found.  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  Discussion: Feels better.  Cough still present but hemoptysis resolved.   Plan: Tussionex for more sustained  cough control.  Slow pred taper; start at 40 mg and decrease by 10 mg every 5d F/u our office-->appointment made  Erick Colace ACNP-BC [facility] Pager # [phone] OR # [phone] if no answer   Breathing better.  Not coughing blood.  Denies chest pain.  No wheeze.  Wean off prednisone.  Will schedule outpt pulmonary f/u.  Okay for d/c home from pulmonary standpoint.  D/w Dr. Wynelle Cleveland.  Chesley Mires, MD [facility] Pulmonary/Critical Care 06/06/2015, 2:28 PM Pager:  [phone] After 3pm call: [phone]

## 2015-06-20 ENCOUNTER — Encounter: Payer: Self-pay | Admitting: Adult Health

## 2015-06-20 ENCOUNTER — Ambulatory Visit (INDEPENDENT_AMBULATORY_CARE_PROVIDER_SITE_OTHER): Payer: Medicaid Other | Admitting: Adult Health

## 2015-06-20 ENCOUNTER — Ambulatory Visit (INDEPENDENT_AMBULATORY_CARE_PROVIDER_SITE_OTHER)
Admission: RE | Admit: 2015-06-20 | Discharge: 2015-06-20 | Disposition: A | Discharge status: Home / Self Care | Payer: Medicaid Other | Source: Ambulatory Visit | Attending: Adult Health | Admitting: Adult Health

## 2015-06-20 VITALS — BP 104/68 | HR 98 | Temp 98.1°F | Ht 60.0 in | Wt 103.0 lb

## 2015-06-20 DIAGNOSIS — R042 Hemoptysis: Secondary | ICD-10-CM

## 2015-06-20 DIAGNOSIS — C3490 Malignant neoplasm of unspecified part of unspecified bronchus or lung: Secondary | ICD-10-CM

## 2015-06-20 DIAGNOSIS — J700 Acute pulmonary manifestations due to radiation: Secondary | ICD-10-CM | POA: Diagnosis not present

## 2015-06-20 MED ORDER — BENZONATATE 100 MG PO CAPS: 100.0000 mg | ORAL_CAPSULE | Freq: Three times a day (TID) | ORAL | Status: DC | PRN

## 2015-06-20 MED ORDER — HYDROCOD POLST-CPM POLST ER 10-8 MG/5ML PO SUER: 5.0000 mL | Freq: Two times a day (BID) | ORAL | Status: DC | PRN

## 2015-06-20 MED ORDER — PREDNISONE 10 MG PO TABS
20.0000 mg | ORAL_TABLET | Freq: Every day | ORAL | Status: DC
Start: 2015-06-20 — End: 2015-07-21

## 2015-06-20 MED FILL — BENZONATATE 100 MG CAPSULE: 100 | 6 days supply | Qty: 20 | Fill #0

## 2015-06-20 NOTE — Progress Notes (Signed)
Subjective:    Patient ID: Deborah Foster, female    DOB: September 18, 1970, 45 y.o.   MRN: [id number]  HPI 45 year old Asian female never smoker with metastatic stage IV adenocarcinoma (dx in 07/2014) , with metastasis to brain, bones adrenal gland, status post radiotherapy to brain as well as bone lesions in the pelvis. S/p IR embolization of RLL superior segment in 07/2014. Treatment with Tarceva and Xgeva.    06/20/2015 post hospital follow-up Accompanied by an interpreter. Patient presents for a post hospital follow-up. Patient has metastatic stage IV adenocarcinoma with metastasis to the brain, bones and adrenal gland. She was recently readmitted to the hospital for recurrent hemoptysis. She is currently undergoing treatment with oncology, with Tarceva and Xgeva .  Most recent CT chest on 06/02/2015 showed no evidence of pulmonary embolism, no clear explanation for hemoptysis identified. Stable radiation changes. Treated with steroids and cough suppression medicines. Currently on steroid taper.  Chest x-ray today shows stable chronic changes. Has follow up with Oncology on 06/28/15 . Currently on Tarceva .  Continues to have hemoptysis several times a day.  Appetite is better on prednisone , has gained back some weight.  Takes Tussionex which helps some, needs refill.  Denies chest pain, fever, orthopnea, edema.    Past Medical History  Diagnosis Date  . Pneumonia   . Hemoptysis   . Lung mass   . Hypokalemia   . Radiation 08/23/14-09/07/14    Brain/chest and left hip 30 Gy 12 Fx  . lung ca dx'd 07/2014  . Metastasis to brain ([place])   . Metastasis to adrenal gland ([place])   . Bone metastasis ([place])   . URI (upper respiratory infection) 02-02-2015   Current Outpatient Prescriptions on File Prior to Visit  Medication Sig Dispense Refill  . benzonatate (TESSALON) 100 MG capsule Take 1 capsule (100 mg total) by mouth 3 (three) times daily as needed for cough. 20 capsule 0  . Calcium Carbonate-Vit  D-Min (CALCIUM 600+D PLUS MINERALS) 600-400 MG-UNIT TABS Take 1 tablet by mouth 2 (two) times daily.    . chlorpheniramine-HYDROcodone (TUSSIONEX) 10-8 MG/5ML SUER Take 5 mLs by mouth every 12 (twelve) hours as needed for cough. 140 mL 0  . clindamycin (CLINDAGEL) 1 % gel Apply topically 2 (two) times daily. 30 g 1  . erlotinib (TARCEVA) 100 MG tablet Take 1 tablet (100 mg total) by mouth daily. Take on an empty stomach 1 hour before meals or 2 hours after 30 tablet 1  . folic acid (FOLVITE) 1 MG tablet Take 1 tablet (1 mg total) by mouth daily. 30 tablet 0  . predniSONE (DELTASONE) 10 MG tablet Start at 40 mg and taper by 10 mg every 5 days. 53 tablet 0  . ferrous sulfate 300 (60 Fe) MG/5ML syrup Take 3.7 mLs (220 mg total) by mouth 2 (two) times daily with a meal. (Patient not taking: Reported on 06/20/2015) 150 mL 3  . fluticasone (FLONASE) 50 MCG/ACT nasal spray Place 1 spray into both nostrils daily. Reported on 06/20/2015    . HYDROcodone Bitartrate ER 20 MG CP12 Take 1 capsule by mouth 2 (two) times daily as needed (for pain). (Patient not taking: Reported on 06/20/2015) 60 capsule 0  . loratadine (CLARITIN) 10 MG tablet Take 1 tablet (10 mg total) by mouth daily. (Patient not taking: Reported on 06/20/2015) 20 tablet 0  . magic mouthwash w/lidocaine SOLN Take 5 mLs by mouth 3 (three) times daily as needed for mouth pain. (Patient not taking:  Reported on 06/20/2015) 480 mL 0  . mometasone-formoterol (DULERA) 200-5 MCG/ACT AERO Inhale 2 puffs into the lungs 2 (two) times daily. Reported on 06/20/2015    . vitamin B-12 1000 MCG tablet Take 1 tablet (1,000 mcg total) by mouth daily. (Patient not taking: Reported on 06/20/2015) 30 tablet 0   No current facility-administered medications on file prior to visit.     Review of Systems Constitutional:   No  weight loss, night sweats,  Fevers, chills,  +fatigue, or  lassitude.  HEENT:   No headaches,  Difficulty swallowing,  Tooth/dental problems, or  Sore  throat,                No sneezing, itching, ear ache, nasal congestion, post nasal drip,   CV:  No chest pain,  Orthopnea, PND, swelling in lower extremities, anasarca, dizziness, palpitations, syncope.   GI  No heartburn, indigestion, abdominal pain, nausea, vomiting, diarrhea, change in bowel habits, loss of appetite, bloody stools.   Resp:   No chest wall deformity  Skin: no rash or lesions.  GU: no dysuria, change in color of urine, no urgency or frequency.  No flank pain, no hematuria   MS:  No joint pain or swelling.  No decreased range of motion.  No back pain.  Psych:  No change in mood or affect. No depression or anxiety.  No memory loss.         Objective:   Physical Exam Filed Vitals:   06/20/15 0941  BP: 104/68  Pulse: 98  Temp: 98.1 F (36.7 C)  TempSrc: Oral  Height: 5' (1.524 m)  Weight: 103 lb (46.72 kg)  SpO2: 96%   GEN: A/Ox3; pleasant , NAD, thin   HEENT:  [place]/AT,  EACs-clear, TMs-wnl, NOSE-clear, THROAT-clear, no lesions, no postnasal drip or exudate noted.   NECK:  Supple w/ fair ROM; no JVD; normal carotid impulses w/o bruits; no thyromegaly or nodules palpated; no lymphadenopathy.  RESP  Clear  P & A; w/o, wheezes/ rales/ or rhonchi.no accessory muscle use, no dullness to percussion  CARD:  RRR, no m/r/g  , no peripheral edema, pulses intact, no cyanosis or clubbing.  GI:   Soft & nt; nml bowel sounds; no organomegaly or masses detected.  Musco: Warm bil, no deformities or joint swelling noted.   Neuro: alert, no focal deficits noted.    Skin: Warm, no lesions or rashes  CXR 06/20/2015  Stable appearance of the chest. Perihilar opacities on the right likely reflect posttreatment change  Kimberle Stanfill NP-C  [facility]  /06/20/2015        Assessment & Plan:

## 2015-06-20 NOTE — Progress Notes (Signed)
Chart reviewed, difficult situation.  Agree with this management plan.

## 2015-06-20 NOTE — Assessment & Plan Note (Signed)
Metastatic Lung cancer  Cont on current regimen  follow up with Dr. Earlie Server as planned in 1 week.

## 2015-06-20 NOTE — Assessment & Plan Note (Signed)
Recurrent hemoptysis with Radiation Pneumonitis  Try to cont w/ steroids as she improved in hospital with IV steroids .  Do not taper off yet, try to cont w/ cough control regimen  If not improving may need FOB to evaluate airway more closely   Plan  Increase Prednisone '10mg'$  2 tabs daily .  Begin Delsym 2 tsp Twice daily  For cough .  Take Tessalon Three times a day  As needed  Cough .  Tussionex 1 tsp Twice daily  For cough  Try to avoid harsh /forceful coughing  Follow up Dr. Lake Bells in 2 weeks and As needed   Please contact office for sooner follow up if symptoms do not improve or worsen or seek emergency care  If bleeding worsens call sooner or go to ER.

## 2015-06-20 NOTE — Patient Instructions (Addendum)
Increase Prednisone '10mg'$  2 tabs daily .  Begin Delsym 2 tsp Twice daily  For cough .  Take Tessalon Three times a day  As needed  Cough .  Tussionex 1 tsp Twice daily  For cough  Try to avoid harsh /forceful coughing  Follow up Dr. Lake Bells in 2 weeks and As needed   Please contact office for sooner follow up if symptoms do not improve or worsen or seek emergency care  If bleeding worsens call sooner or go to ER.

## 2015-06-24 MED FILL — predniSONE 10 MG TABS: 10 | 30 days supply | Qty: 60 | Fill #0

## 2015-06-28 ENCOUNTER — Ambulatory Visit (HOSPITAL_BASED_OUTPATIENT_CLINIC_OR_DEPARTMENT_OTHER): Payer: Medicaid Other

## 2015-06-28 ENCOUNTER — Other Ambulatory Visit (HOSPITAL_BASED_OUTPATIENT_CLINIC_OR_DEPARTMENT_OTHER): Payer: Medicaid Other

## 2015-06-28 ENCOUNTER — Telehealth: Payer: Self-pay | Admitting: Internal Medicine

## 2015-06-28 ENCOUNTER — Encounter: Payer: Self-pay | Admitting: Internal Medicine

## 2015-06-28 ENCOUNTER — Ambulatory Visit (HOSPITAL_BASED_OUTPATIENT_CLINIC_OR_DEPARTMENT_OTHER): Payer: Medicaid Other | Admitting: Internal Medicine

## 2015-06-28 VITALS — BP 121/65 | HR 112 | Temp 98.0°F | Resp 18 | Ht 60.0 in | Wt 103.9 lb

## 2015-06-28 DIAGNOSIS — K1230 Oral mucositis (ulcerative), unspecified: Secondary | ICD-10-CM | POA: Diagnosis not present

## 2015-06-28 DIAGNOSIS — Z5111 Encounter for antineoplastic chemotherapy: Secondary | ICD-10-CM

## 2015-06-28 DIAGNOSIS — M898X9 Other specified disorders of bone, unspecified site: Secondary | ICD-10-CM

## 2015-06-28 DIAGNOSIS — C7931 Secondary malignant neoplasm of brain: Secondary | ICD-10-CM

## 2015-06-28 DIAGNOSIS — C3491 Malignant neoplasm of unspecified part of right bronchus or lung: Secondary | ICD-10-CM

## 2015-06-28 DIAGNOSIS — C342 Malignant neoplasm of middle lobe, bronchus or lung: Secondary | ICD-10-CM | POA: Diagnosis not present

## 2015-06-28 DIAGNOSIS — M9080 Osteopathy in diseases classified elsewhere, unspecified site: Principal | ICD-10-CM

## 2015-06-28 DIAGNOSIS — C7951 Secondary malignant neoplasm of bone: Secondary | ICD-10-CM

## 2015-06-28 DIAGNOSIS — R21 Rash and other nonspecific skin eruption: Secondary | ICD-10-CM

## 2015-06-28 DIAGNOSIS — E889 Metabolic disorder, unspecified: Secondary | ICD-10-CM

## 2015-06-28 LAB — CBC WITH DIFFERENTIAL/PLATELET
BASO%: 0.5 % (ref 0.0–2.0)
BASOS ABS: 0.1 10*3/uL (ref 0.0–0.1)
EOS ABS: 0.1 10*3/uL (ref 0.0–0.5)
EOS%: 1.2 % (ref 0.0–7.0)
HEMATOCRIT: 40.9 % (ref 34.8–46.6)
HEMOGLOBIN: 13.5 g/dL (ref 11.6–15.9)
LYMPH#: 0.8 10*3/uL — AB (ref 0.9–3.3)
LYMPH%: 6.2 % — ABNORMAL LOW (ref 14.0–49.7)
MCH: 31.2 pg (ref 25.1–34.0)
MCHC: 33 g/dL (ref 31.5–36.0)
MCV: 94.5 fL (ref 79.5–101.0)
MONO#: 0.6 10*3/uL (ref 0.1–0.9)
MONO%: 5 % (ref 0.0–14.0)
NEUT#: 10.5 10*3/uL — ABNORMAL HIGH (ref 1.5–6.5)
NEUT%: 87.1 % — AB (ref 38.4–76.8)
PLATELETS: 469 10*3/uL — AB (ref 145–400)
RBC: 4.33 10*6/uL (ref 3.70–5.45)
RDW: 14.6 % — AB (ref 11.2–14.5)
WBC: 12 10*3/uL — ABNORMAL HIGH (ref 3.9–10.3)

## 2015-06-28 LAB — COMPREHENSIVE METABOLIC PANEL
ALBUMIN: 3.4 g/dL — AB (ref 3.5–5.0)
ALK PHOS: 50 U/L (ref 40–150)
ALT: 19 U/L (ref 0–55)
ANION GAP: 8 meq/L (ref 3–11)
AST: 14 U/L (ref 5–34)
BUN: 11.3 mg/dL (ref 7.0–26.0)
CALCIUM: 9 mg/dL (ref 8.4–10.4)
CHLORIDE: 105 meq/L (ref 98–109)
CO2: 26 mEq/L (ref 22–29)
Creatinine: 0.6 mg/dL (ref 0.6–1.1)
Glucose: 106 mg/dl (ref 70–140)
POTASSIUM: 3.6 meq/L (ref 3.5–5.1)
Sodium: 139 mEq/L (ref 136–145)
Total Bilirubin: 0.67 mg/dL (ref 0.20–1.20)
Total Protein: 6.6 g/dL (ref 6.4–8.3)

## 2015-06-28 MED ORDER — CLINDAMYCIN PHOSPHATE 1 % EX GEL: Freq: Two times a day (BID) | CUTANEOUS | Status: DC

## 2015-06-28 MED ORDER — DENOSUMAB 120 MG/1.7ML ~~LOC~~ SOLN
120.0000 mg | Freq: Once | SUBCUTANEOUS | Status: AC
  Administered 2015-06-28: 120 mg via SUBCUTANEOUS
  Filled 2015-06-28: qty 1.7

## 2015-06-28 MED FILL — CLINDAMYCIN PH 1% GEL: 1 | 10 days supply | Qty: 30 | Fill #0

## 2015-06-28 NOTE — Patient Instructions (Signed)
Denosumab injection  What is this medicine?  DENOSUMAB (den oh sue mab) slows bone breakdown. Prolia is used to treat osteoporosis in women after menopause and in men. Xgeva is used to prevent bone fractures and other bone problems caused by cancer bone metastases. Xgeva is also used to treat giant cell tumor of the bone.  This medicine may be used for other purposes; ask your health care provider or pharmacist if you have questions.  What should I tell my health care provider before I take this medicine?  They need to know if you have any of these conditions:  -dental disease  -eczema  -infection or history of infections  -kidney disease or on dialysis  -low blood calcium or vitamin D  -malabsorption syndrome  -scheduled to have surgery or tooth extraction  -taking medicine that contains denosumab  -thyroid or parathyroid disease  -an unusual reaction to denosumab, other medicines, foods, dyes, or preservatives  -pregnant or trying to get pregnant  -breast-feeding  How should I use this medicine?  This medicine is for injection under the skin. It is given by a health care professional in a hospital or clinic setting.  If you are getting Prolia, a special MedGuide will be given to you by the pharmacist with each prescription and refill. Be sure to read this information carefully each time.  For Prolia, talk to your pediatrician regarding the use of this medicine in children. Special care may be needed. For Xgeva, talk to your pediatrician regarding the use of this medicine in children. While this drug may be prescribed for children as young as 13 years for selected conditions, precautions do apply.  Overdosage: If you think you have taken too much of this medicine contact a poison control center or emergency room at once.  NOTE: This medicine is only for you. Do not share this medicine with others.  What if I miss a dose?  It is important not to miss your dose. Call your doctor or health care professional if you are  unable to keep an appointment.  What may interact with this medicine?  Do not take this medicine with any of the following medications:  -other medicines containing denosumab  This medicine may also interact with the following medications:  -medicines that suppress the immune system  -medicines that treat cancer  -steroid medicines like prednisone or cortisone  This list may not describe all possible interactions. Give your health care provider a list of all the medicines, herbs, non-prescription drugs, or dietary supplements you use. Also tell them if you smoke, drink alcohol, or use illegal drugs. Some items may interact with your medicine.  What should I watch for while using this medicine?  Visit your doctor or health care professional for regular checks on your progress. Your doctor or health care professional may order blood tests and other tests to see how you are doing.  Call your doctor or health care professional if you get a cold or other infection while receiving this medicine. Do not treat yourself. This medicine may decrease your body's ability to fight infection.  You should make sure you get enough calcium and vitamin D while you are taking this medicine, unless your doctor tells you not to. Discuss the foods you eat and the vitamins you take with your health care professional.  See your dentist regularly. Brush and floss your teeth as directed. Before you have any dental work done, tell your dentist you are receiving this medicine.  Do   not become pregnant while taking this medicine or for 5 months after stopping it. Women should inform their doctor if they wish to become pregnant or think they might be pregnant. There is a potential for serious side effects to an unborn child. Talk to your health care professional or pharmacist for more information.  What side effects may I notice from receiving this medicine?  Side effects that you should report to your doctor or health care professional as soon as  possible:  -allergic reactions like skin rash, itching or hives, swelling of the face, lips, or tongue  -breathing problems  -chest pain  -fast, irregular heartbeat  -feeling faint or lightheaded, falls  -fever, chills, or any other sign of infection  -muscle spasms, tightening, or twitches  -numbness or tingling  -skin blisters or bumps, or is dry, peels, or red  -slow healing or unexplained pain in the mouth or jaw  -unusual bleeding or bruising  Side effects that usually do not require medical attention (Report these to your doctor or health care professional if they continue or are bothersome.):  -muscle pain  -stomach upset, gas  This list may not describe all possible side effects. Call your doctor for medical advice about side effects. You may report side effects to FDA at 1-800-FDA-1088.  Where should I keep my medicine?  This medicine is only given in a clinic, doctor's office, or other health care setting and will not be stored at home.  NOTE: This sheet is a summary. It may not cover all possible information. If you have questions about this medicine, talk to your doctor, pharmacist, or health care provider.      2016, Elsevier/Gold Standard. (2011-07-02 12:37:47)

## 2015-06-28 NOTE — Telephone Encounter (Signed)
Gave and printed appt sched and avs for pt for July  °

## 2015-06-28 NOTE — Progress Notes (Signed)
[facility] [phone]   [phone]  OFFICE PROGRESS NOTE  [name], MD [place] [address]  DIAGNOSIS: Stage IV (T2a, N1, M1b) non-small cell lung cancer, adenocarcinoma presented with right middle lobe lung mass in addition to right hilar adenopathy as well as metastatic disease to the bone, brain as well as left adrenal metastasis diagnosed in July 2016  PRIOR THERAPY:  1) palliative radiotherapy to the brain as well as metastatic bone lesions in the pelvis. 2) Tarceva 150 mg by mouth daily started on 09/25/2014. Status post 5 months of treatment  CURRENT THERAPY:  1) Tarceva 100 mg by mouth daily started on 02/19/2015. Status post 4 months of treatment. 2) Xgeva 120 g subcutaneously monthly.  INTERVAL HISTORY: Deborah Foster 45 y.o. female returns to the clinic today for hospital follow-up visit accompanied by her interpreter. The patient is feeling a little bit better today. She continues to have cough but no more hemoptysis. She is currently on prednisone 20 mg by mouth daily by Dr. Lake Bells. She gained 5 pounds in the last 4 weeks. She is happy with her weight gain. The patient is tolerating her current treatment with Tarceva fairly well except for grade 1 skin rash mainly on the face and upper chest. She is requesting refill of clindamycin lotion. She denied having any significant chest pain, or shortness of breath. She denied having any nausea or vomiting. She has occasional diarrhea. She is here today for repeat blood work and IT consultant injection.  MEDICAL HISTORY: Past Medical History  Diagnosis Date  . Pneumonia   . Hemoptysis   . Lung mass   . Hypokalemia   . Radiation 08/23/14-09/07/14    Brain/chest and left hip 30 Gy 12 Fx  . lung ca dx'd 07/2014  . Metastasis to brain ([place])   . Metastasis to adrenal gland ([place])   . Bone metastasis ([place])   . URI (upper respiratory infection) 02-02-2015    ALLERGIES:   is allergic to other and doxycycline.  MEDICATIONS:  Current Outpatient Prescriptions  Medication Sig Dispense Refill  . benzonatate (TESSALON) 100 MG capsule Take 1 capsule (100 mg total) by mouth 3 (three) times daily as needed for cough. 20 capsule 0  . Calcium Carbonate-Vit D-Min (CALCIUM 600+D PLUS MINERALS) 600-400 MG-UNIT TABS Take 1 tablet by mouth 2 (two) times daily.    . chlorpheniramine-HYDROcodone (TUSSIONEX) 10-8 MG/5ML SUER Take 5 mLs by mouth every 12 (twelve) hours as needed for cough. 140 mL 0  . clindamycin (CLINDAGEL) 1 % gel Apply topically 2 (two) times daily. 30 g 1  . erlotinib (TARCEVA) 100 MG tablet Take 1 tablet (100 mg total) by mouth daily. Take on an empty stomach 1 hour before meals or 2 hours after 30 tablet 1  . fluticasone (FLONASE) 50 MCG/ACT nasal spray Place 1 spray into both nostrils daily. Reported on 03/20/4654    . folic acid (FOLVITE) 1 MG tablet Take 1 tablet (1 mg total) by mouth daily. 30 tablet 0  . HYDROcodone Bitartrate ER 20 MG CP12 Take 1 capsule by mouth 2 (two) times daily as needed (for pain). 60 capsule 0  . predniSONE (DELTASONE) 10 MG tablet Take 2 tablets (20 mg total) by mouth daily with breakfast. 60 tablet 0  . vitamin B-12 1000 MCG tablet Take 1 tablet (1,000 mcg total) by mouth daily. 30 tablet 0  . mometasone-formoterol (DULERA) 200-5 MCG/ACT AERO Inhale 2 puffs into  the lungs 2 (two) times daily. Reported on 06/28/2015     No current facility-administered medications for this visit.   Facility-Administered Medications Ordered in Other Visits  Medication Dose Route Frequency Provider Last Rate Last Dose  . denosumab (XGEVA) injection 120 mg  120 mg Subcutaneous Once [name], MD        SURGICAL HISTORY:  Past Surgical History  Procedure Laterality Date  . Video bronchoscopy Bilateral 08/09/2014    Procedure: VIDEO BRONCHOSCOPY WITHOUT FLUORO;  Surgeon: [name], MD;  Location: [facility];  Service:  Cardiopulmonary;  Laterality: Bilateral;    REVIEW OF SYSTEMS:  A comprehensive review of systems was negative except for: Respiratory: positive for cough   PHYSICAL EXAMINATION: General appearance: alert, cooperative, fatigued and no distress Head: Normocephalic, without obvious abnormality, atraumatic Neck: no adenopathy, no JVD, supple, symmetrical, trachea midline and thyroid not enlarged, symmetric, no tenderness/mass/nodules Lymph nodes: Cervical, supraclavicular, and axillary nodes normal. Resp: clear to auscultation bilaterally Back: symmetric, no curvature. ROM normal. No CVA tenderness. Cardio: regular rate and rhythm, S1, S2 normal, no murmur, click, rub or gallop GI: soft, non-tender; bowel sounds normal; no masses,  no organomegaly Extremities: extremities normal, atraumatic, no cyanosis or edema Neurologic: Alert and oriented X 3, normal strength and tone. Normal symmetric reflexes. Normal coordination and gait  Skin exam showed maculopapular grade 2 skin rash on the face and upper chest.  ECOG PERFORMANCE STATUS: 1 - Symptomatic but completely ambulatory  Blood pressure 121/65, pulse 112, temperature 98 F (36.7 C), temperature source Oral, resp. rate 18, height 5' (1.524 m), weight 103 lb 14.4 oz (47.129 kg), SpO2 100 %.  LABORATORY DATA: Lab Results  Component Value Date   WBC 12.0* 06/28/2015   HGB 13.5 06/28/2015   HCT 40.9 06/28/2015   MCV 94.5 06/28/2015   PLT 469* 06/28/2015      Chemistry      Component Value Date/Time   NA 139 06/28/2015 0930   NA 139 06/02/2015 1052   K 3.6 06/28/2015 0930   K 3.9 06/02/2015 1052   CL 107 06/02/2015 1052   CO2 26 06/28/2015 0930   CO2 27 06/02/2015 1052   BUN 11.3 06/28/2015 0930   BUN 8 06/02/2015 1052   CREATININE 0.6 06/28/2015 0930   CREATININE 0.49 06/02/2015 1052      Component Value Date/Time   CALCIUM 9.0 06/28/2015 0930   CALCIUM 9.3 06/02/2015 1052   ALKPHOS 50 06/28/2015 0930   ALKPHOS 89  04/30/2015 1215   AST 14 06/28/2015 0930   AST 20 04/30/2015 1215   ALT 19 06/28/2015 0930   ALT 21 04/30/2015 1215   BILITOT 0.67 06/28/2015 0930   BILITOT 0.8 04/30/2015 1215       RADIOGRAPHIC STUDIES: Dg Chest 2 View  06/20/2015  CLINICAL DATA:  Intermittent chest pain for 3 days. Hemoptysis. History of lung cancer. Subsequent encounter. EXAM: CHEST  2 VIEW COMPARISON:  PA and lateral chest and CT chest 06/02/2015. CT chest 03/28/2015. FINDINGS: Right perihilar and right middle and lower lobe opacities appear unchanged. The left lung is clear. Heart size is normal. No pneumothorax or pleural effusion. IMPRESSION: Stable appearance of the chest. Perihilar opacities on the right likely reflect posttreatment change. No acute abnormality or finding to explain the patient's symptoms is identified. Electronically Signed   By: Inge Rise M.D.   On: 06/20/2015 09:20   Dg Chest 2 View  06/02/2015  CLINICAL DATA:  Coughing up blood. History of lung  cancer and brain metastasis. EXAM: CHEST  2 VIEW COMPARISON:  CT 04/30/2015, radiograph 02/15/2015 FINDINGS: Normal cardiac silhouette. Stable RIGHT perihilar mass like consolidation not changed comparison CT or radiograph. No pleural fluid. No pulmonary edema. No pneumothorax. IMPRESSION: 1. No interval change. 2. Stable RIGHT perihilar masslike consolidation. Electronically Signed   By: Suzy Bouchard M.D.   On: 06/02/2015 10:04   Ct Angio Chest Pe W/cm &/or Wo Cm  06/02/2015  CLINICAL DATA:  45 year old with hemoptysis for 3 days with chest pain. History of lung cancer. Chemotherapy ongoing. EXAM: CT ANGIOGRAPHY CHEST WITH CONTRAST TECHNIQUE: Multidetector CT imaging of the chest was performed using the standard protocol during bolus administration of intravenous contrast. Multiplanar CT image reconstructions and MIPs were obtained to evaluate the vascular anatomy. CONTRAST:  80 ml Isovue-300. COMPARISON:  Radiographs 06/02/2015.  CT 04/30/2015.  FINDINGS: Mediastinum: The pulmonary arteries are adequately, but not optimally opacified with contrast. There is no evidence of acute pulmonary embolism. The main pulmonary artery is dilated, consistent with pulmonary arterial hypertension. No significant systemic arterial abnormalities are identified. The heart size is normal. There is no pericardial effusion. There are no enlarged mediastinal, hilar or axillary lymph nodes. The thyroid gland, trachea and esophagus demonstrate no significant findings. Lungs/Pleura: There is no pleural effusion.There is stable bronchiectasis and right perihilar opacity extending into the right middle and lower lobes, attributed to prior radiation therapy. No recurrent mass lesion or endobronchial lesion identified. There is a stable calcified granuloma in the superior segment of the right lower lobe. The left lung is clear. Upper abdomen: The visualized upper abdomen appears stable without suspicious findings. Musculoskeletal/Chest wall: No chest wall lesion or acute osseous findings.There is stable sclerosis of the right humeral head, right clavicular head and sternal manubrium. Old fracture of the right sixth rib noted. Review of the MIP images confirms the above findings. IMPRESSION: 1. No evidence of acute pulmonary embolism. 2. No clear explanation for hemoptysis identified. 3. Stable probable post radiation changes in the right perihilar region with architectural distortion and fibrosis. No specific evidence of local recurrence or metastatic disease. Given the patient's history, bronchoscopy may be warranted if there is persistent unexplained hemoptysis. 4. Stable treated osseous metastatic disease. Electronically Signed   By: Richardean Sale M.D.   On: 06/02/2015 12:06    ASSESSMENT AND PLAN: This is a very pleasant 45 years old Asian female never smoker with recently diagnosed with stage IV non-small cell lung cancer, adenocarcinoma presented with right middle lobe lung  mass in addition to right hilar adenopathy and metastatic disease to the bones, brain and left adrenal gland.  She status post whole brain irradiation with some improvement in her disease on the recent MRI of the brain. The patient completed treatment with Tarceva 150 mg by mouth daily for 5 months and tolerating it fairly well except for grade 2 skin rash and recent episodes of diarrhea. She is currently on Tarceva 100 mg by mouth daily started 02/19/2015. She is tolerating the lower dose of Tarceva much better. I recommended for her to continue her current treatment with Tarceva 100 mg by mouth daily. For the skin rash, she will continue on clindamycin lotion. I will send her refill 2 [facility]. For the metastatic bone lesions, she started treatment with Delton See and she is tolerating it well.  For the dry cough and occasional hemoptysis, this is most likely secondary to the extensive radiation changes in the right perihilar area.she will continue on the  cough medication as well as prednisone.  She will come back for follow-up visit in one month for reevaluation with repeat blood work. She was advised to call immediately if she has any concerning symptoms in the interval. The patient voices understanding of current disease status and treatment options and is in agreement with the current care plan.  All questions were answered. The patient knows to call the clinic with any problems, questions or concerns. We can certainly see the patient much sooner if necessary.  Disclaimer: This note was dictated with voice recognition software. Similar sounding words can inadvertently be transcribed and may not be corrected upon review.

## 2015-07-05 ENCOUNTER — Other Ambulatory Visit (INDEPENDENT_AMBULATORY_CARE_PROVIDER_SITE_OTHER): Payer: Medicaid Other

## 2015-07-05 ENCOUNTER — Telehealth: Payer: Self-pay | Admitting: *Deleted

## 2015-07-05 ENCOUNTER — Encounter: Payer: Self-pay | Admitting: Pulmonary Disease

## 2015-07-05 ENCOUNTER — Ambulatory Visit (INDEPENDENT_AMBULATORY_CARE_PROVIDER_SITE_OTHER): Payer: Medicaid Other | Admitting: Pulmonary Disease

## 2015-07-05 DIAGNOSIS — R1310 Dysphagia, unspecified: Secondary | ICD-10-CM | POA: Diagnosis not present

## 2015-07-05 LAB — CBC WITH DIFFERENTIAL/PLATELET
BASOS PCT: 0.5 % (ref 0.0–3.0)
Basophils Absolute: 0.1 10*3/uL (ref 0.0–0.1)
EOS PCT: 0 % (ref 0.0–5.0)
Eosinophils Absolute: 0 10*3/uL (ref 0.0–0.7)
HCT: 39.1 % (ref 36.0–46.0)
HEMOGLOBIN: 13.3 g/dL (ref 12.0–15.0)
LYMPHS ABS: 0.9 10*3/uL (ref 0.7–4.0)
Lymphocytes Relative: 6.2 % — ABNORMAL LOW (ref 12.0–46.0)
MCHC: 33.9 g/dL (ref 30.0–36.0)
MCV: 92.8 fl (ref 78.0–100.0)
MONO ABS: 0.2 10*3/uL (ref 0.1–1.0)
Monocytes Relative: 1.2 % — ABNORMAL LOW (ref 3.0–12.0)
NEUTROS ABS: 13 10*3/uL — AB (ref 1.4–7.7)
Neutrophils Relative %: 92.1 % — ABNORMAL HIGH (ref 43.0–77.0)
Platelets: 493 10*3/uL — ABNORMAL HIGH (ref 150.0–400.0)
RBC: 4.22 Mil/uL (ref 3.87–5.11)
RDW: 14.4 % (ref 11.5–15.5)
WBC: 14.1 10*3/uL — AB (ref 4.0–10.5)

## 2015-07-05 MED ORDER — HYDROCOD POLST-CPM POLST ER 10-8 MG/5ML PO SUER
5.0000 mL | Freq: Two times a day (BID) | ORAL | Status: DC | PRN
Start: 2015-07-05 — End: 2015-07-21

## 2015-07-05 MED ORDER — BENZONATATE 100 MG PO CAPS: 100.0000 mg | ORAL_CAPSULE | Freq: Three times a day (TID) | ORAL | Status: DC | PRN

## 2015-07-05 MED FILL — BENZONATATE 100 MG CAPSULE: 100 | 7 days supply | Qty: 20 | Fill #0

## 2015-07-05 NOTE — Telephone Encounter (Signed)
Received call from pt stating that pt is vietnamese & not sure is she understands her treatment b/c she tells nurse that she is not on any treatment.  Reviewed Dr Worthy Flank last OV note & pt is on tarceva & is to cont & had been tolerating 100 mg daily & pt expressed understanding through interpreter per Dr Worthy Flank note.  She appreciated the info & reports  this will help her do some teaching with the pt.

## 2015-07-05 NOTE — Progress Notes (Signed)
Subjective:    Patient ID: Deborah Foster, female    DOB: [birth year], 45 y.o.   MRN: [id number]  Synopsis:  45 year old female diagnosed with metastatic adenocarcinoma of the lung in 2016 treated with chemoradiation therapy. At the time of her diagnosis she had massive hemoptysis requiring emergent intubation and interventional radiology guided embolization of her right lower lobe. After treatment in late 2016 in early 2017 she presented with hemoptysis again in May 2017.   HPI Chief Complaint  Patient presents with  . Follow-up    pt c/o prod cough with clear mucus with bright red blood.  This had gone away but came back X4 days ago.     Deborah Foster says that she initially got better after taking the steorids but last week on Thursday (June 15) she started coughing up bright red blood again.  She has been coughing up blood since then.  She continues to cough up blood on a daily basis.  No fever.  She has some dyspnea.  The hemoptysis is slightly better but still persistent.  She still has some pain in the middle of her chest.  It is worse when she swallows.    She also notes that she has been vomiting blood, the most recent was two weeks ago.  She has not vomited since then. She has pain every time she swallows solids.  She never has food get stuck.  Past Medical History  Diagnosis Date  . Pneumonia   . Hemoptysis   . Lung mass   . Hypokalemia   . Radiation 08/23/14-09/07/14    Brain/chest and left hip 30 Gy 12 Fx  . lung ca dx'd 07/2014  . Metastasis to brain ([place])   . Metastasis to adrenal gland ([place])   . Bone metastasis ([place])   . URI (upper respiratory infection) 02-02-2015      Review of Systems     Objective:   Physical Exam Filed Vitals:   07/05/15 1323  BP: 114/70  Pulse: 98  Height: 5' (1.524 m)  Weight: 106 lb 6.4 oz (48.263 kg)  SpO2: 97%   RA  Gen: well appearing HENT: OP clear, neck supple PULM: Crackles RUL, normal effort, normal percussion CV: RRR, no mgr, trace  edema GI: BS+, soft, nontender Derm: no cyanosis or rash Psyche: normal mood and affect        Assessment & Plan:  Odynophagia I am concerned that she may also have an esophageal lesion as she notes odynophagia and occasional dysphagia to solids.    Plan: Barium esophogram  Radiation pneumonitis with hemoptysis I worry that this is related to her tumor and not radiation pneumonitis as this has recently worsened despite recent steroids.  Plan: Bronchoscopy this week CBC today Renew cough suppression medicines Depending on the results of the CBC, we may need to coordinate an embolization with interventional radiology.     Current outpatient prescriptions:  .  benzonatate (TESSALON) 100 MG capsule, Take 1 capsule (100 mg total) by mouth 3 (three) times daily as needed for cough., Disp: 20 capsule, Rfl: 0 .  chlorpheniramine-HYDROcodone (TUSSIONEX) 10-8 MG/5ML SUER, Take 5 mLs by mouth every 12 (twelve) hours as needed for cough., Disp: 140 mL, Rfl: 0 .  erlotinib (TARCEVA) 100 MG tablet, Take 1 tablet (100 mg total) by mouth daily. Take on an empty stomach 1 hour before meals or 2 hours after, Disp: 30 tablet, Rfl: 1 .  ferrous sulfate 325 (65 FE) MG EC tablet, Take  325 mg by mouth daily with breakfast., Disp: , Rfl:  .  folic acid (FOLVITE) 1 MG tablet, Take 1 tablet (1 mg total) by mouth daily., Disp: 30 tablet, Rfl: 0 .  mometasone-formoterol (DULERA) 200-5 MCG/ACT AERO, Inhale 2 puffs into the lungs 2 (two) times daily as needed. Reported on 06/28/2015, Disp: , Rfl:  .  NON FORMULARY, Juice plus supplements- take 4 tablets daily., Disp: , Rfl:  .  predniSONE (DELTASONE) 10 MG tablet, Take 2 tablets (20 mg total) by mouth daily with breakfast., Disp: 60 tablet, Rfl: 0 .  vitamin B-12 1000 MCG tablet, Take 1 tablet (1,000 mcg total) by mouth daily., Disp: 30 tablet, Rfl: 0

## 2015-07-05 NOTE — Patient Instructions (Signed)
We will arrange a bronchoscopy at [facility] either tomorrow morning or Thursday morning We will also arrange for a barium swallow test Take the cough medicines as prescribed After the procedure, you may need to go to radiology for a second procedure to treat the bleeding, we will let you know on the day of the procedure

## 2015-07-05 NOTE — Assessment & Plan Note (Signed)
I am concerned that she may also have an esophageal lesion as she notes odynophagia and occasional dysphagia to solids.    Plan: Barium esophogram

## 2015-07-05 NOTE — Assessment & Plan Note (Signed)
I worry that this is related to her tumor and not radiation pneumonitis as this has recently worsened despite recent steroids.  Plan: Bronchoscopy this week CBC today Renew cough suppression medicines Depending on the results of the CBC, we may need to coordinate an embolization with interventional radiology.

## 2015-07-06 ENCOUNTER — Ambulatory Visit (HOSPITAL_COMMUNITY)
Admission: RE | Admit: 2015-07-06 | Discharge: 2015-07-06 | Disposition: A | Discharge status: Home / Self Care | Payer: Medicaid Other | Source: Ambulatory Visit | Attending: Pulmonary Disease | Admitting: Pulmonary Disease

## 2015-07-06 ENCOUNTER — Other Ambulatory Visit: Payer: Self-pay | Admitting: Pulmonary Disease

## 2015-07-06 ENCOUNTER — Telehealth: Payer: Self-pay | Admitting: Pulmonary Disease

## 2015-07-06 ENCOUNTER — Ambulatory Visit (HOSPITAL_COMMUNITY)
Admit: 2015-07-06 | Discharge: 2015-07-06 | Disposition: A | Discharge status: Home / Self Care | Payer: Medicaid Other | Attending: Pulmonary Disease | Admitting: Pulmonary Disease

## 2015-07-06 ENCOUNTER — Encounter (HOSPITAL_COMMUNITY)
Admission: RE | Disposition: A | Discharge status: Home / Self Care | Payer: Self-pay | Source: Ambulatory Visit | Attending: Pulmonary Disease

## 2015-07-06 ENCOUNTER — Encounter (HOSPITAL_COMMUNITY): Payer: Self-pay | Admitting: Radiology

## 2015-07-06 DIAGNOSIS — R911 Solitary pulmonary nodule: Secondary | ICD-10-CM | POA: Insufficient documentation

## 2015-07-06 DIAGNOSIS — Z923 Personal history of irradiation: Secondary | ICD-10-CM | POA: Insufficient documentation

## 2015-07-06 DIAGNOSIS — C7951 Secondary malignant neoplasm of bone: Secondary | ICD-10-CM | POA: Diagnosis not present

## 2015-07-06 DIAGNOSIS — C7931 Secondary malignant neoplasm of brain: Secondary | ICD-10-CM | POA: Diagnosis not present

## 2015-07-06 DIAGNOSIS — C7970 Secondary malignant neoplasm of unspecified adrenal gland: Secondary | ICD-10-CM | POA: Diagnosis not present

## 2015-07-06 DIAGNOSIS — R042 Hemoptysis: Secondary | ICD-10-CM | POA: Diagnosis not present

## 2015-07-06 DIAGNOSIS — C3491 Malignant neoplasm of unspecified part of right bronchus or lung: Secondary | ICD-10-CM | POA: Diagnosis not present

## 2015-07-06 DIAGNOSIS — C3490 Malignant neoplasm of unspecified part of unspecified bronchus or lung: Secondary | ICD-10-CM | POA: Insufficient documentation

## 2015-07-06 DIAGNOSIS — R1310 Dysphagia, unspecified: Secondary | ICD-10-CM

## 2015-07-06 HISTORY — PX: VIDEO BRONCHOSCOPY: SHX5072

## 2015-07-06 SURGERY — VIDEO BRONCHOSCOPY WITHOUT FLUORO
Anesthesia: Moderate Sedation | Laterality: Bilateral

## 2015-07-06 MED ORDER — MIDAZOLAM HCL 5 MG/ML IJ SOLN
INTRAMUSCULAR | Status: AC
  Filled 2015-07-06: qty 2

## 2015-07-06 MED ORDER — PHENYLEPHRINE HCL 0.25 % NA SOLN
NASAL | Status: DC | PRN
  Administered 2015-07-06: 2 via NASAL

## 2015-07-06 MED ORDER — LIDOCAINE HCL (PF) 1 % IJ SOLN
INTRAMUSCULAR | Status: DC | PRN
  Administered 2015-07-06: 6 mL

## 2015-07-06 MED ORDER — FENTANYL CITRATE (PF) 100 MCG/2ML IJ SOLN
INTRAMUSCULAR | Status: AC
  Filled 2015-07-06: qty 4

## 2015-07-06 MED ORDER — FENTANYL CITRATE (PF) 100 MCG/2ML IJ SOLN
INTRAMUSCULAR | Status: DC | PRN
  Administered 2015-07-06: 50 ug via INTRAVENOUS
  Administered 2015-07-06: 25 ug via INTRAVENOUS

## 2015-07-06 MED ORDER — MIDAZOLAM HCL 10 MG/2ML IJ SOLN
INTRAMUSCULAR | Status: DC | PRN
  Administered 2015-07-06: 1 mg via INTRAVENOUS
  Administered 2015-07-06: 2 mg via INTRAVENOUS

## 2015-07-06 MED ORDER — SODIUM CHLORIDE 0.9 % IV SOLN
INTRAVENOUS | Status: DC
  Administered 2015-07-06: 07:00:00 via INTRAVENOUS

## 2015-07-06 MED ORDER — LIDOCAINE HCL 2 % EX GEL
CUTANEOUS | Status: DC | PRN
  Administered 2015-07-06: 1

## 2015-07-06 NOTE — H&P (Signed)
  [facility]   HPI: [name] is well known to me from a prior hospitalization for her diagnosis of lung adenocarcinoma in the setting of hemoptysis.  She has been having more hemoptysis post treatment.  Past Medical History  Diagnosis Date  . Pneumonia   . Hemoptysis   . Lung mass   . Hypokalemia   . Radiation 08/23/14-09/07/14    Brain/chest and left hip 30 Gy 12 Fx  . lung ca dx'd 07/2014  . Metastasis to brain ([place])   . Metastasis to adrenal gland ([place])   . Bone metastasis ([place])   . URI (upper respiratory infection) 02-02-2015     History reviewed. No pertinent family history.   Social History   Social History  . Marital Status: Married    Spouse Name: N/A  . Number of Children: 3  . Years of Education: N/A   Occupational History  . Not on file.   Social History Main Topics  . Smoking status: Never Smoker   . Smokeless tobacco: Never Used  . Alcohol Use: No  . Drug Use: No  . Sexual Activity: Not on file   Other Topics Concern  . Not on file   Social History Narrative   Patient moved to [place] in [place].   Had been in [place] since that time.     Allergies  Allergen Reactions  . Other Other (See Comments)    Patient states that there is some she takes that makes her throat "cry" AND SCRATCHY   . Doxycycline Nausea And Vomiting     '@encmedstart'$ @  Filed Vitals:   07/06/15 0635 07/06/15 0640 07/06/15 0645 07/06/15 0650  BP: 127/75 125/68 128/65 136/56  Temp:      TempSrc:      Resp: '20 17 20 22  '$ Height:      Weight:      SpO2: 100% 100% 100% 100%   RA  Gen: well appearing HENT: OP clear, neck supple PULM: CTA B, normal percussion CV: RRR, no mgr, trace edema GI: BS+, soft, nontender Derm: facial rash Psyche: normal mood and affect  CBC    Component Value Date/Time   WBC 14.1* 07/05/2015 1420   WBC 12.0* 06/28/2015 0930   RBC 4.22 07/05/2015 1420   RBC 4.33 06/28/2015 0930   RBC 3.66* 06/05/2015 0401   HGB 13.3 07/05/2015 1420   HGB 13.5 06/28/2015 0930   HCT 39.1 07/05/2015 1420   HCT 40.9 06/28/2015 0930   PLT 493.0* 07/05/2015 1420   PLT 469* 06/28/2015 0930   MCV 92.8 07/05/2015 1420   MCV 94.5 06/28/2015 0930   MCH 31.2 06/28/2015 0930   MCH 31.4 06/06/2015 0356   MCHC 33.9 07/05/2015 1420   MCHC 33.0 06/28/2015 0930   RDW 14.4 07/05/2015 1420   RDW 14.6* 06/28/2015 0930   LYMPHSABS 0.9 07/05/2015 1420   LYMPHSABS 0.8* 06/28/2015 0930   MONOABS 0.2 07/05/2015 1420   MONOABS 0.6 06/28/2015 0930   EOSABS 0.0 07/05/2015 1420   EOSABS 0.1 06/28/2015 0930   BASOSABS 0.1 07/05/2015 1420   BASOSABS 0.1 06/28/2015 0930    06/02/2015 CT chest images reviewed> infiltrate vs radiation pneumonitis changes in the RUL and RML  Impression/Plan:  Recurrent hemoptysis with known lung adenocarcinoma: plan bronchoscopy to identify the source of bleeding  Roselie Awkward, MD [place] [facility] Pager: [phone] Cell: [phone] After 3pm or if no response, call [phone]

## 2015-07-06 NOTE — Op Note (Addendum)
[facility]xx Cardiopulmonary Patient Name: Deborah Foster Procedure Date: 07/06/2015 MRN: [id number] Attending MD: [name] , MD Date of Birth: 1970-02-19 CSN: [id number] Age: 45 Admit Type: Outpatient Ethnicity: Not Hispanic or Latino Procedure:            Bronchoscopy Indications:          Hemoptysis with abnormal CXR Providers:            Nathaneil Canary B. Lake Bells, MD, Cherre Huger RRT, RCP, Tammie                        Readling RRT,RCP Referring MD:          Medicines:            Fentanyl 75 mcg IV, Midazolam 3 mg mg IV, Lidocaine 1%                        applied to nares 6 mL, Lidocaine 1% applied to cords 6                        mL, Lidocaine 1% applied to the tracheobronchial tree 3                        mL, None Complications:        No immediate complications Estimated Blood Loss: Estimated blood loss was minimal. Procedure:      Pre-Anesthesia Assessment:      - A History and Physical has been performed. Patient meds and allergies       have been reviewed. The risks and benefits of the procedure and the       sedation options and risks were discussed with the patient. All       questions were answered and informed consent was obtained. Patient       identification and proposed procedure were verified prior to the       procedure by the physician and the technician in the procedure room.       Mental Status Examination: normal. Airway Examination: normal       oropharyngeal airway. Respiratory Examination: clear to auscultation. CV       Examination: normal. ASA Grade Assessment: II - A patient with mild       systemic disease. After reviewing the risks and benefits, the patient       was deemed in satisfactory condition to undergo the procedure. The       anesthesia plan was to use moderate sedation / analgesia (conscious       sedation). Immediately prior to administration of medications, the       patient was re-assessed for adequacy to receive sedatives.  The heart       rate, respiratory rate, oxygen saturations, blood pressure, adequacy of       pulmonary ventilation, and response to care were monitored throughout       the procedure. The physical status of the patient was re-assessed after       the procedure.      After obtaining informed consent, the bronchoscope was passed under       direct vision. Throughout the procedure, the patient's blood pressure,       pulse, and oxygen saturations were monitored continuously. the [id number]       ([id number]) scope was introduced through the right nostril and advanced to  the tracheobronchial tree. The procedure was accomplished without       difficulty. The patient tolerated the procedure well. The total duration       of the procedure was 8 minutes. Findings:      The nasopharynx/oropharynx appears normal. The larynx appears normal.       The vocal cords appear normal. The subglottic space is normal. The       trachea is of normal caliber. The carina is sharp. The tracheobronchial       tree of the left lung was examined to at least the first subsegmental       level. Bronchial mucosa and anatomy in the left lung are normal; there       are no endobronchial lesions, and no secretions.      Right Lung Abnormalities: A small non-obstructing infiltrative,       erythematous (hyperemic), friable (with contact bleeding) and submucosal       lesion was found proximally, at the orifice in the medial basal segment       of the right lower lobe (B7) and in the anterior basal segment of the       right lower lobe (B8). The mucosa of the right middle lobe was also       slightly friable, but there was no active bleeding seen. I kept the       scope in her airway while she was coughing for several minutes hoping to       see if blood would come from the right middle lobe or right lower lobe,       but none came spontaneously. Based on the appearance of the airway       mucosa the bleeding is most  likely coming from the right lower lobe       lesion. Impression:      - Hemoptysis with abnormal CXR      - The left lung was normal.      - A lesion was found in the medial basal segment of the right lower lobe       (B7) and in the anterior basal segment of the right lower lobe (B8).      - Friable mucosa was found in the right middle lobe and in the right       lower lobe.      - No specimens collected.      - Based on the appearance of the airway mucosa the hemoptysis she has       been experiencing is most likely coming from the right lower lobe lesion. Moderate Sedation:      Moderate (conscious) sedation was personally administered by the       endoscopist. The following parameters were monitored: oxygen saturation,       heart rate, blood pressure, and response to care. Total physician       intraservice time was 12 minutes. Recommendation:      - Follow up with bronchoscopist as previously scheduled. Will discuss       her case with the interventional radiologist. Procedure Code(s):      --- Professional ---      [phone], Bronchoscopy, rigid or flexible, including fluoroscopic guidance,       when performed; diagnostic, with cell washing, when performed (separate       procedure)      99152, Moderate sedation services provided by the same physician or  other qualified health care professional performing the diagnostic or       therapeutic service that the sedation supports, requiring the presence       of an independent trained observer to assist in the monitoring of the       patient's level of consciousness and physiological status; initial 15       minutes of intraservice time, patient age 35 years or older Diagnosis Code(s):      --- Professional ---      R04.2, Hemoptysis      R91.8, Other nonspecific abnormal finding of lung field      J98.4, Other disorders of lung CPT copyright 2016 [employer]. All rights reserved. The codes documented in  this report are preliminary and upon coder review may  be revised to meet current compliance requirements. Norlene Campbell, MD [name], MD 07/06/2015 7:41:13 AM This report has been signed electronically. Number of Addenda: 0 Scope In: 7:17:53 AM Scope Out: 7:25:03 AM

## 2015-07-06 NOTE — Progress Notes (Signed)
Video bronchoscopy done just to view pt. Lung area for hemoptysis. No samples taken. Pt discharged with husband. Pt. Going to xray for swallow test.

## 2015-07-06 NOTE — Telephone Encounter (Signed)
Bronchoscopy today showed that the hemoptysis is most likely coming from the right lower lobe.  This was explained to the patient's husband with a translator and I explained that she will need another embolization of the right lung.  IR to evaluate her this week.  Roselie Awkward, MD [facility] Pager: [phone] Cell: [phone] After 3pm or if no response, call [phone]

## 2015-07-06 NOTE — Telephone Encounter (Signed)
OK. Thank you

## 2015-07-07 ENCOUNTER — Encounter (HOSPITAL_COMMUNITY): Payer: Self-pay | Admitting: Pulmonary Disease

## 2015-07-07 ENCOUNTER — Other Ambulatory Visit: Payer: Self-pay | Admitting: Pulmonary Disease

## 2015-07-07 ENCOUNTER — Encounter: Payer: Self-pay | Admitting: Gastroenterology

## 2015-07-07 DIAGNOSIS — R933 Abnormal findings on diagnostic imaging of other parts of digestive tract: Secondary | ICD-10-CM

## 2015-07-11 ENCOUNTER — Other Ambulatory Visit: Payer: Self-pay | Admitting: Pulmonary Disease

## 2015-07-11 DIAGNOSIS — R042 Hemoptysis: Secondary | ICD-10-CM

## 2015-07-12 ENCOUNTER — Other Ambulatory Visit: Payer: Self-pay

## 2015-07-13 ENCOUNTER — Ambulatory Visit (HOSPITAL_COMMUNITY): Payer: Medicaid Other

## 2015-07-14 ENCOUNTER — Ambulatory Visit
Admission: RE | Admit: 2015-07-14 | Discharge: 2015-07-14 | Disposition: A | Discharge status: Home / Self Care | Payer: Medicaid Other | Source: Ambulatory Visit | Attending: Pulmonary Disease | Admitting: Pulmonary Disease

## 2015-07-14 DIAGNOSIS — R042 Hemoptysis: Secondary | ICD-10-CM

## 2015-07-14 HISTORY — PX: IR GENERIC HISTORICAL: IMG1180011

## 2015-07-14 NOTE — Consult Note (Signed)
Chief Complaint:   Right lung cancer, status post radiation therapy and chronic radiation pneumonitis. Chronic intermittent hemoptysis.   Referring Physician(s): McQuaid,Douglas B  Patient Status: Outpatient  History of Present Illness: Deborah Foster is a 45 y.o. female who is well-known to our service. She has a diagnosis of stage IV adenocarcinoma of the right lung, status post right chest radiation. She has had intermittent hemoptysis most recently in May and June. Previously, she had acute hemoptysis which required bronchial artery embolization 08/10/2014.  Because of the 2 recent episodes of hemoptysis, she had a repeat bronchoscopy 07/06/2015. This demonstrated a right lower lobe lesion within the medial basal and anterior basal segments with friable mucosa but no current active bleeding. However, based on the appearance of the airway mucosa, the hemoptysis was felt to be most likely from the right lower lobe lesion as before. Over the last week, since the bronchoscopy, she has not had a repeat episode of hemoptysis.  She presents today to discuss repeat bronchial artery embolization.  Past Medical History  Diagnosis Date  . Pneumonia   . Hemoptysis   . Lung mass   . Hypokalemia   . Radiation 08/23/14-09/07/14    Brain/chest and left hip 30 Gy 12 Fx  . lung ca dx'd 07/2014  . Metastasis to brain ([place])   . Metastasis to adrenal gland ([place])   . Bone metastasis ([place])   . URI (upper respiratory infection) 02-02-2015    Past Surgical History  Procedure Laterality Date  . Video bronchoscopy Bilateral 08/09/2014    Procedure: VIDEO BRONCHOSCOPY WITHOUT FLUORO;  Surgeon: [name], MD;  Location: [facility];  Service: Cardiopulmonary;  Laterality: Bilateral;  . Video bronchoscopy Bilateral 07/06/2015    Procedure: VIDEO BRONCHOSCOPY WITHOUT FLUORO;  Surgeon: [name], MD;  Location: [facility];  Service: Cardiopulmonary;  Laterality: Bilateral;     Allergies: Other and Doxycycline  Medications: Prior to Admission medications   Medication Sig Start Date End Date Taking? Authorizing Provider  benzonatate (TESSALON) 100 MG capsule Take 1 capsule (100 mg total) by mouth 3 (three) times daily as needed for cough. 07/05/15  Yes [name], MD  Calcium Carb-Cholecalciferol (CALCIUM + D3) 600-200 MG-UNIT TABS Take 1 tablet by mouth 2 (two) times daily. Calcium Carbonate 600 mg/Vitamin D-3 400 IU po bid   Yes Historical Provider, MD  chlorpheniramine-HYDROcodone (TUSSIONEX) 10-8 MG/5ML SUER Take 5 mLs by mouth every 12 (twelve) hours as needed for cough. 07/05/15  Yes [name], MD  erlotinib (TARCEVA) 100 MG tablet Take 1 tablet (100 mg total) by mouth daily. Take on an empty stomach 1 hour before meals or 2 hours after 05/09/15  Yes [name], MD  ferrous sulfate 325 (65 FE) MG EC tablet Take 325 mg by mouth daily with breakfast.   Yes Historical Provider, MD  folic acid (FOLVITE) 1 MG tablet Take 1 tablet (1 mg total) by mouth daily. 06/06/15  Yes [name], MD  loratadine (CLARITIN) 10 MG tablet Take 10 mg by mouth daily.   Yes Historical Provider, MD  mometasone-formoterol (DULERA) 200-5 MCG/ACT AERO Inhale 2 puffs into the lungs 2 (two) times daily as needed. Reported on 06/28/2015   Yes Historical Provider, MD  NON FORMULARY Juice plus supplements- take 4 tablets daily.   Yes Historical Provider, MD  TURMERIC PO Take 1 capsule by mouth 2 (two) times daily. Turmeric Superior 750 mg po bid   Yes Historical Provider, MD  vitamin  B-12 1000 MCG tablet Take 1 tablet (1,000 mcg total) by mouth daily. 06/06/15  Yes [name], MD  predniSONE (DELTASONE) 10 MG tablet Take 2 tablets (20 mg total) by mouth daily with breakfast. Patient not taking: Reported on 07/14/2015 06/20/15   Melvenia Needles, NP     No family history on file.  Social History   Social History  . Marital Status: Married    Spouse Name: N/A  . Number of  Children: 3  . Years of Education: N/A   Social History Main Topics  . Smoking status: Never Smoker   . Smokeless tobacco: Never Used  . Alcohol Use: No  . Drug Use: No  . Sexual Activity: Not on file   Other Topics Concern  . Not on file   Social History Narrative   Patient moved to [place] in [place].   Had been in [place] since that time.    ECOG Status: 1 - Symptomatic but completely ambulatory  Review of Systems: A 12 point ROS discussed and pertinent positives are indicated in the HPI above.  All other systems are negative.  Review of Systems  Vital Signs: BP 95/55 mmHg  Pulse 89  Temp(Src) 97.9 F (36.6 C) (Oral)  Resp 14  Ht 5' (1.524 m)  Wt 106 lb (48.081 kg)  BMI 20.70 kg/m2  SpO2 98%  Physical Exam  Constitutional: She is oriented to person, place, and time. She appears well-developed and well-nourished. No distress.  Cardiovascular: Normal rate, regular rhythm and normal heart sounds.  Exam reveals no friction rub.   No murmur heard. Pulmonary/Chest: Effort normal and breath sounds normal. No respiratory distress. She has no wheezes. She exhibits no tenderness.  Abdominal: Soft. Bowel sounds are normal. She exhibits no distension.  Neurological: She is oriented to person, place, and time.  Skin: She is not diaphoretic.  Psychiatric: She has a normal mood and affect. Her behavior is normal. Judgment and thought content normal.    Mallampati Score:   1  Imaging: Dg Chest 2 View  06/20/2015  CLINICAL DATA:  Intermittent chest pain for 3 days. Hemoptysis. History of lung cancer. Subsequent encounter. EXAM: CHEST  2 VIEW COMPARISON:  PA and lateral chest and CT chest 06/02/2015. CT chest 03/28/2015. FINDINGS: Right perihilar and right middle and lower lobe opacities appear unchanged. The left lung is clear. Heart size is normal. No pneumothorax or pleural effusion. IMPRESSION: Stable appearance of the chest. Perihilar opacities on the right likely  reflect posttreatment change. No acute abnormality or finding to explain the patient's symptoms is identified. Electronically Signed   By: Inge Rise M.D.   On: 06/20/2015 09:20   Dg Esophagus  07/06/2015  CLINICAL DATA:  Lung cancer undergoing radiation therapy. Odynophagia EXAM: ESOPHOGRAM / BARIUM SWALLOW / BARIUM TABLET STUDY TECHNIQUE: Combined double contrast and single contrast examination performed using effervescent crystals, thick barium liquid, and thin barium liquid. The patient was observed with fluoroscopy swallowing a 13 mm barium sulphate tablet. FLUOROSCOPY TIME:  Radiation Exposure Index (as provided by the fluoroscopic device): 9.4 mGy If the device does not provide the exposure index: Fluoroscopy Time:  dictate in minutes and seconds Number of Acquired Images: COMPARISON:  CT chest 06/02/2015 FINDINGS: Normal swallowing function in the high cervical esophagus. No mucosal irregularity within the thoracic esophagus or distal esophagus. No stricture mass within the esophagus. GE junction is normal With patient in prone position, there is poor initiation of the primary stripping wave. Contrast remained  in the proximal esophagus with to and fro motion and to the high cervical esophagus and hypopharynx. No tertiary contractions. 13 mm barium tablet passed the GE junction easily. IMPRESSION: 1. Esophageal dysmotility with poor initiation of the primary stripping wave and to and fro motion of barium column in the high cervical esophagus. No tertiary contractions. 2. No mucosal irregularity stricture mass in esophagus. 3. Barium tablet passed GE junction easily. Electronically Signed   By: Suzy Bouchard M.D.   On: 07/06/2015 10:40    Labs:  CBC:  Recent Labs  06/04/15 0338 06/06/15 0356 06/28/15 0930 07/05/15 1420  WBC 16.8* 11.7* 12.0* 14.1*  HGB 11.6* 11.2* 13.5 13.3  HCT 34.5* 33.0* 40.9 39.1  PLT 401* 368 469* 493.0*    COAGS:  Recent Labs  08/10/14 1150  04/30/15 1215  INR 1.17 0.96    BMP:  Recent Labs  04/30/15 1215 05/01/15 0425 05/05/15 1030 05/26/15 1030 06/02/15 1052 06/28/15 0930  NA 139 139 139 141 139 139  K 3.7 3.4* 3.9 3.8 3.9 3.6  CL 107 112* 105  --  107  --   CO2 '27 23 26 28 27 26  '$ GLUCOSE 78 86 88 105 95 106  BUN '7 6 8 '$ 12.3 8 11.3  CALCIUM 8.5* 7.5* 9.0 9.1 9.3 9.0  CREATININE 0.53 0.61 0.53 0.7 0.49 0.6  GFRNONAA >60 >60 >60  --  >60  --   GFRAA >60 >60 >60  --  >60  --     LIVER FUNCTION TESTS:  Recent Labs  04/28/15 1101 04/30/15 1215 05/05/15 1030 05/26/15 1030 06/28/15 0930  BILITOT 0.70 0.8  --  0.72 0.67  AST 16 20  --  17 14  ALT 13 21  --  18 19  ALKPHOS 85 89  --  80 50  PROT 6.5 6.9  --  7.1 6.6  ALBUMIN 3.3* 3.8 3.9 3.7 3.4*    TUMOR MARKERS: No results for input(s): AFPTM, CEA, CA199, CHROMGRNA in the last 8760 hours.  Assessment and Plan:  Recurrent episodes of hemoptysis with known metastatic stage IV lung adenocarcinoma status post radiation in the right lung. Recent endoscopy demonstrates the likely source of intermittent bleeding to be within the right lower lobe lesion as before.  Today, we discussed repeat bronchial artery angiogram and embolization. The procedure, risks, benefits and alternatives were reviewed. The patient has a clear understanding of the procedure and does remember the intervention from last summer. After our discussion she would like to wait until she sees Dr. Earlie Server on 07/26/2015.  I would recommend scheduling the repeat bronchial artery embolization in the next few weeks. She understands that if she has another episode of hemoptysis to return to the hospital for admission and bronchial embolization could be performed sooner.  Thank you for this interesting consult.  I greatly enjoyed meeting Deborah Foster and look forward to participating in their care.  A copy of this report was sent to the requesting provider on this date.  Electronically  Signed: Greggory Keen 07/14/2015, 10:11 AM   I spent a total of  40 Minutes   in face to face in clinical consultation, greater than 50% of which was counseling/coordinating care for this patient with intermittent hemoptysis related to lung adenocarcinoma, status post radiation therapy

## 2015-07-18 ENCOUNTER — Telehealth: Payer: Self-pay

## 2015-07-18 ENCOUNTER — Emergency Department (HOSPITAL_COMMUNITY): Payer: Medicaid Other

## 2015-07-18 ENCOUNTER — Inpatient Hospital Stay (HOSPITAL_COMMUNITY)
Admission: EM | Admit: 2015-07-18 | Discharge: 2015-07-21 | DRG: 982 | Disposition: A | Discharge status: Home / Self Care | Payer: Medicaid Other | Attending: Internal Medicine | Admitting: Internal Medicine

## 2015-07-18 ENCOUNTER — Encounter (HOSPITAL_COMMUNITY): Payer: Self-pay | Admitting: Emergency Medicine

## 2015-07-18 ENCOUNTER — Inpatient Hospital Stay (HOSPITAL_COMMUNITY): Payer: Medicaid Other

## 2015-07-18 DIAGNOSIS — C7931 Secondary malignant neoplasm of brain: Secondary | ICD-10-CM | POA: Diagnosis present

## 2015-07-18 DIAGNOSIS — R042 Hemoptysis: Secondary | ICD-10-CM | POA: Diagnosis present

## 2015-07-18 DIAGNOSIS — C7970 Secondary malignant neoplasm of unspecified adrenal gland: Secondary | ICD-10-CM | POA: Diagnosis present

## 2015-07-18 DIAGNOSIS — M9080 Osteopathy in diseases classified elsewhere, unspecified site: Secondary | ICD-10-CM

## 2015-07-18 DIAGNOSIS — E889 Metabolic disorder, unspecified: Secondary | ICD-10-CM | POA: Diagnosis present

## 2015-07-18 DIAGNOSIS — C7951 Secondary malignant neoplasm of bone: Secondary | ICD-10-CM | POA: Diagnosis present

## 2015-07-18 DIAGNOSIS — C3491 Malignant neoplasm of unspecified part of right bronchus or lung: Principal | ICD-10-CM | POA: Diagnosis present

## 2015-07-18 DIAGNOSIS — C3490 Malignant neoplasm of unspecified part of unspecified bronchus or lung: Secondary | ICD-10-CM | POA: Diagnosis not present

## 2015-07-18 DIAGNOSIS — Z923 Personal history of irradiation: Secondary | ICD-10-CM | POA: Diagnosis not present

## 2015-07-18 DIAGNOSIS — J700 Acute pulmonary manifestations due to radiation: Secondary | ICD-10-CM | POA: Diagnosis present

## 2015-07-18 DIAGNOSIS — Z515 Encounter for palliative care: Secondary | ICD-10-CM

## 2015-07-18 DIAGNOSIS — Y842 Radiological procedure and radiotherapy as the cause of abnormal reaction of the patient, or of later complication, without mention of misadventure at the time of the procedure: Secondary | ICD-10-CM | POA: Diagnosis present

## 2015-07-18 DIAGNOSIS — C342 Malignant neoplasm of middle lobe, bronchus or lung: Secondary | ICD-10-CM | POA: Diagnosis not present

## 2015-07-18 DIAGNOSIS — E440 Moderate protein-calorie malnutrition: Secondary | ICD-10-CM | POA: Insufficient documentation

## 2015-07-18 DIAGNOSIS — E278 Other specified disorders of adrenal gland: Secondary | ICD-10-CM | POA: Diagnosis present

## 2015-07-18 DIAGNOSIS — M898X9 Other specified disorders of bone, unspecified site: Secondary | ICD-10-CM | POA: Diagnosis present

## 2015-07-18 LAB — CBC
HCT: 38.6 % (ref 36.0–46.0)
HEMATOCRIT: 41.5 % (ref 36.0–46.0)
Hemoglobin: 13.9 g/dL (ref 12.0–15.0)
Hemoglobin: 13.1 g/dL (ref 12.0–15.0)
MCH: 31.1 pg (ref 26.0–34.0)
MCH: 31.6 pg (ref 26.0–34.0)
MCHC: 33.5 g/dL (ref 30.0–36.0)
MCHC: 33.9 g/dL (ref 30.0–36.0)
MCV: 92.8 fL (ref 78.0–100.0)
MCV: 93 fL (ref 78.0–100.0)
PLATELETS: 410 10*3/uL — AB (ref 150–400)
PLATELETS: 383 10*3/uL (ref 150–400)
RBC: 4.47 MIL/uL (ref 3.87–5.11)
RBC: 4.15 MIL/uL (ref 3.87–5.11)
RDW: 13.2 % (ref 11.5–15.5)
RDW: 13.3 % (ref 11.5–15.5)
WBC: 7.2 10*3/uL (ref 4.0–10.5)
WBC: 6.9 10*3/uL (ref 4.0–10.5)

## 2015-07-18 LAB — PROTIME-INR
INR: 0.99 (ref 0.00–1.49)
Prothrombin Time: 12.9 seconds (ref 11.6–15.2)

## 2015-07-18 LAB — BASIC METABOLIC PANEL
Anion gap: 7 (ref 5–15)
Anion gap: 7 (ref 5–15)
BUN: 9 mg/dL (ref 6–20)
BUN: 9 mg/dL (ref 6–20)
CALCIUM: 8.6 mg/dL — AB (ref 8.9–10.3)
CHLORIDE: 105 mmol/L (ref 101–111)
CO2: 26 mmol/L (ref 22–32)
CO2: 25 mmol/L (ref 22–32)
CREATININE: 0.58 mg/dL (ref 0.44–1.00)
CREATININE: 0.45 mg/dL (ref 0.44–1.00)
Calcium: 8.3 mg/dL — ABNORMAL LOW (ref 8.9–10.3)
Chloride: 103 mmol/L (ref 101–111)
GFR calc Af Amer: >60 mL/min (ref >60)
GFR calc Af Amer: >60 mL/min (ref >60)
GLUCOSE: 113 mg/dL — AB (ref 65–99)
Glucose, Bld: 89 mg/dL (ref 65–99)
Potassium: 3.5 mmol/L (ref 3.5–5.1)
Potassium: 3.8 mmol/L (ref 3.5–5.1)
SODIUM: 137 mmol/L (ref 135–145)
Sodium: 136 mmol/L (ref 135–145)

## 2015-07-18 LAB — HEPATIC FUNCTION PANEL
ALBUMIN: 3.7 g/dL (ref 3.5–5.0)
ALT: 18 U/L (ref 14–54)
AST: 20 U/L (ref 15–41)
Alkaline Phosphatase: 46 U/L (ref 38–126)
BILIRUBIN TOTAL: 0.7 mg/dL (ref 0.3–1.2)
Bilirubin, Direct: <0.1 mg/dL — ABNORMAL LOW (ref 0.1–0.5)
Total Protein: 6.5 g/dL (ref 6.5–8.1)

## 2015-07-18 LAB — TYPE AND SCREEN
ABO/RH(D): B POS
Antibody Screen: NEGATIVE

## 2015-07-18 LAB — APTT: APTT: 39 s — AB (ref 24–37)

## 2015-07-18 LAB — GLUCOSE, CAPILLARY: Glucose-Capillary: 87 mg/dL (ref 65–99)

## 2015-07-18 MED ORDER — SODIUM CHLORIDE 0.9% FLUSH
3.0000 mL | Freq: Two times a day (BID) | INTRAVENOUS | Status: DC
Start: 2015-07-18 — End: 2015-07-21
  Administered 2015-07-18 – 2015-07-21 (×2): 3 mL via INTRAVENOUS

## 2015-07-18 MED ORDER — ERLOTINIB HCL 100 MG PO TABS
100.0000 mg | ORAL_TABLET | Freq: Every day | ORAL | Status: DC
  Administered 2015-07-18 – 2015-07-21 (×4): 100 mg via ORAL
  Filled 2015-07-18 (×6): qty 1

## 2015-07-18 MED ORDER — BENZONATATE 100 MG PO CAPS: 100.0000 mg | ORAL_CAPSULE | Freq: Three times a day (TID) | ORAL | Status: DC | PRN

## 2015-07-18 MED ORDER — VITAMIN B-12 1000 MCG PO TABS
2500.0000 ug | ORAL_TABLET | Freq: Every day | ORAL | Status: DC
  Administered 2015-07-18 – 2015-07-21 (×4): 2500 ug via ORAL
  Filled 2015-07-18 (×4): qty 3

## 2015-07-18 MED ORDER — HYDROCOD POLST-CPM POLST ER 10-8 MG/5ML PO SUER
5.0000 mL | Freq: Two times a day (BID) | ORAL | Status: DC | PRN
  Administered 2015-07-18 – 2015-07-21 (×6): 5 mL via ORAL
  Filled 2015-07-18 (×6): qty 5

## 2015-07-18 MED ORDER — FERROUS SULFATE 325 (65 FE) MG PO TABS
325.0000 mg | ORAL_TABLET | Freq: Every day | ORAL | Status: DC
  Administered 2015-07-19 – 2015-07-21 (×3): 325 mg via ORAL
  Filled 2015-07-18 (×4): qty 1

## 2015-07-18 MED ORDER — TURMERIC 500 MG PO CAPS: 1.0000 | ORAL_CAPSULE | Freq: Two times a day (BID) | ORAL | Status: DC

## 2015-07-18 MED ORDER — LORATADINE 10 MG PO TABS
10.0000 mg | ORAL_TABLET | Freq: Every day | ORAL | Status: DC
  Administered 2015-07-18 – 2015-07-21 (×4): 10 mg via ORAL
  Filled 2015-07-18 (×4): qty 1

## 2015-07-18 MED ORDER — ACETAMINOPHEN 325 MG PO TABS
650.0000 mg | ORAL_TABLET | Freq: Four times a day (QID) | ORAL | Status: DC | PRN
Start: 2015-07-18 — End: 2015-07-18

## 2015-07-18 MED ORDER — SODIUM CHLORIDE 0.9 % IV BOLUS (SEPSIS)
250.0000 mL | Freq: Once | INTRAVENOUS | Status: AC
Start: 2015-07-18 — End: 2015-07-18
  Administered 2015-07-18: 250 mL via INTRAVENOUS

## 2015-07-18 MED ORDER — MIDAZOLAM HCL 2 MG/2ML IJ SOLN
INTRAMUSCULAR | Status: AC | PRN
  Administered 2015-07-18 (×4): 1 mg via INTRAVENOUS

## 2015-07-18 MED ORDER — FOLIC ACID 1 MG PO TABS
1.0000 mg | ORAL_TABLET | Freq: Every day | ORAL | Status: DC
  Administered 2015-07-18 – 2015-07-21 (×4): 1 mg via ORAL
  Filled 2015-07-18 (×4): qty 1

## 2015-07-18 MED ORDER — ACETAMINOPHEN 650 MG RE SUPP
650.0000 mg | Freq: Four times a day (QID) | RECTAL | Status: DC | PRN
Start: 2015-07-18 — End: 2015-07-21

## 2015-07-18 MED ORDER — MOMETASONE FURO-FORMOTEROL FUM 200-5 MCG/ACT IN AERO
2.0000 | INHALATION_SPRAY | Freq: Two times a day (BID) | RESPIRATORY_TRACT | Status: DC | PRN
  Filled 2015-07-18: qty 8.8

## 2015-07-18 MED ORDER — FENTANYL CITRATE (PF) 100 MCG/2ML IJ SOLN
INTRAMUSCULAR | Status: AC
  Filled 2015-07-18: qty 4

## 2015-07-18 MED ORDER — VITAMIN B-12 2500 MCG SL SUBL: 2500.0000 ug | SUBLINGUAL_TABLET | Freq: Every day | SUBLINGUAL | Status: DC

## 2015-07-18 MED ORDER — FENTANYL CITRATE (PF) 100 MCG/2ML IJ SOLN
INTRAMUSCULAR | Status: AC | PRN
  Administered 2015-07-18 (×2): 25 ug via INTRAVENOUS
  Administered 2015-07-18: 50 ug via INTRAVENOUS

## 2015-07-18 MED ORDER — SODIUM CHLORIDE 0.9 % IV SOLN
INTRAVENOUS | Status: DC
  Administered 2015-07-18 – 2015-07-21 (×4): via INTRAVENOUS

## 2015-07-18 MED ORDER — CLINDAMYCIN PHOSPHATE 1 % EX SOLN
1.0000 "application " | Freq: Two times a day (BID) | CUTANEOUS | Status: DC
  Administered 2015-07-18 – 2015-07-21 (×7): 1 via TOPICAL
  Filled 2015-07-18: qty 30

## 2015-07-18 MED ORDER — ONDANSETRON HCL 4 MG/2ML IJ SOLN: 4.0000 mg | Freq: Three times a day (TID) | INTRAMUSCULAR | Status: AC | PRN

## 2015-07-18 MED ORDER — IOPAMIDOL (ISOVUE-300) INJECTION 61%
100.0000 mL | Freq: Once | INTRAVENOUS | Status: AC | PRN
  Administered 2015-07-18: 80 mL via INTRAVENOUS

## 2015-07-18 MED ORDER — JUICE PLUS FIBRE PO LIQD: 1.0000 | Freq: Every day | ORAL | Status: DC

## 2015-07-18 MED ORDER — MIDAZOLAM HCL 2 MG/2ML IJ SOLN
INTRAMUSCULAR | Status: AC
  Filled 2015-07-18: qty 6

## 2015-07-18 MED ORDER — LIDOCAINE HCL 1 % IJ SOLN
INTRAMUSCULAR | Status: AC
  Filled 2015-07-18: qty 20

## 2015-07-18 NOTE — Progress Notes (Signed)
PROGRESS NOTE                                                                                                                                                                                                             Patient Demographics:    Deborah Foster, is a 45 y.o. female, DOB - [birth year], IOX:[id number]  Admit date - 07/18/2015   Admitting Physician Ivor Costa, MD  Outpatient Primary MD for the patient is [name], MD  LOS - 0  Chief Complaint  Patient presents with  . Hemoptysis       Brief Narrative    Deborah Foster is a 45 y.o. female with medical history significant of metastasized NSCLC-stage IV, s/p of radiation therapy, pneumonitis, hemoptysis, who presents with hemoptysis.  Patient has stage IV metastasized NSCLC (metastasized to brain, bone, adrenal gland). She had radiation therapy, which caused pneumonitis and hemoptysis. She is s/p IR embolization of RLL superior segment in 07/2014. She was hospitalized in May from 5/18 to 5/22 due to hemoptysis, which resolved with steroid treatment. She states that she continues to have hemoptysis recently. She was seen by Dr. Waunita Schooner on 6/21 and had a repeat bronchoscopy 07/06/2015. This demonstrated a right lower lobe lesion within the medial basal and anterior basal segments with friable mucosa, but no current active bleeding. She was seen by IR, Dr. Annamaria Boots on 07/14/15, who recommended to repeat bronchial artery angiogram and embolization. Per Dr. Fritz Pickerel note, the pt would like to wait until she sees Dr. Earlie Server on 07/26/2015. She states that she has worsening hemoptysis since yesterday when she had 2 episode of hemoptysis with bright red blood. She had 7 episode of hemoptysis, with 1-2 teaspoons of bright red blood today. She has chest discomfort, but denies chest pain, fever or chills. She has mild cough and shortness of breath. She has nausea, and occasional vomiting,  but no diarrhea or abdominal pain. Patient denies symptoms of UTI or unilateral weakness. Patient states that she wants to do embolism as recommended by Dr. Annamaria Boots.   ED Course: pt was found to have hemoglobin 13.9, INR 0.99, WBC 7.2, platelet 410, temperature normal, no tachycardia, oxygen saturation 96% on room air, electrolytes and renal function okay. Chest x-ray showed right perihilar airspace opacification again noted, stable in appearance, likely reflecting posttreatment change. Pt is admitted to  inpatient for further interventional treatment.   Subjective:    Deborah Foster today has, No headache, No chest pain, No abdominal pain - No Nausea, No new weakness tingling or numbness, No Cough - SOB. Mild hemoptysis.    Assessment  & Plan :     1. Right Middle lobe stage IV metastasized NSCLC Adenocarcinoma (metastasized to brain, bone, adrenal gland) with Recurrent hemoptysis likely due to combination of her malignancy and some element of radiation pneumonitis, she is under the care of oncologist Dr. Julien Nordmann, pulmonary Dr. Lake Bells and IR physician Dr. Annamaria Boots has undergone embolization in the past-  Still having intermittent mild hemoptysis, discussed the case with her oncologist Dr. Julien Nordmann, IR has been consulted and she is due for another embolization procedure later on 07/18/2015. After the procedure will resume diet and monitor. If stable discharge tomorrow with outpatient follow-up with her primary team of physicians.  2. For her above-mentioned cancer she is under the care of Dr. Julien Nordmann and she is currently undergoing Tarceva and Xgeva treatment.   Family Communication  :  None present  Code Status :  Full  Diet : Regular after IR procedure  Disposition Plan  : Stay inpt  Consults  :  IR  Procedures  :      DVT Prophylaxis  :  SCDs    Lab Results  Component Value Date   PLT 383 07/18/2015    Inpatient Medications  Scheduled Meds: . clindamycin  1 application Topical BID   . vitamin B-12  2,500 mcg Oral Daily  . erlotinib  100 mg Oral Daily  . ferrous sulfate  325 mg Oral Q breakfast  . folic acid  1 mg Oral Daily  . loratadine  10 mg Oral Daily  . sodium chloride flush  3 mL Intravenous Q12H   Continuous Infusions: . sodium chloride 75 mL/hr at 07/18/15 0622   PRN Meds:.[DISCONTINUED] acetaminophen **OR** acetaminophen, chlorpheniramine-HYDROcodone, mometasone-formoterol, ondansetron (ZOFRAN) IV  Antibiotics  :    Anti-infectives    None         Objective:   Filed Vitals:   07/18/15 0101 07/18/15 0406 07/18/15 0430  BP: 117/77 106/72 127/112  Pulse: 76 80 118  Temp: 97.9 F (36.6 C) 97.5 F (36.4 C) 97.6 F (36.4 C)  TempSrc: Oral Oral Axillary  Resp: '20 20 20  '$ Height:   5' (1.524 m)  Weight:   47.356 kg (104 lb 6.4 oz)  SpO2: 96% 97% 100%    Wt Readings from Last 3 Encounters:  07/18/15 47.356 kg (104 lb 6.4 oz)  07/14/15 48.081 kg (106 lb)  07/06/15 48.081 kg (106 lb)    No intake or output data in the 24 hours ending 07/18/15 1007   Physical Exam  Awake Alert, Oriented X 3, No new F.N deficits, Normal affect [place].AT,PERRAL Supple Neck,No JVD, No cervical lymphadenopathy appriciated.  Symmetrical Chest wall movement, Good air movement bilaterally, CTAB RRR,No Gallops,Rubs or new Murmurs, No Parasternal Heave +ve B.Sounds, Abd Soft, No tenderness, No organomegaly appriciated, No rebound - guarding or rigidity. No Cyanosis, Clubbing or edema, No new Rash or bruise       Data Review:    CBC  Recent Labs Lab 07/18/15 0210 07/18/15 0453  WBC 7.2 6.9  HGB 13.9 13.1  HCT 41.5 38.6  PLT 410* 383  MCV 92.8 93.0  MCH 31.1 31.6  MCHC 33.5 33.9  RDW 13.2 13.3    Chemistries   Recent Labs Lab 07/18/15 0210 07/18/15 0453  NA 136 137  K 3.5 3.8  CL 103 105  CO2 26 25  GLUCOSE 113* 89  BUN 9 9  CREATININE 0.58 0.45  CALCIUM 8.6* 8.3*  AST  --  20  ALT  --  18  ALKPHOS  --  46  BILITOT  --  0.7    ------------------------------------------------------------------------------------------------------------------ No results for input(s): CHOL, HDL, LDLCALC, TRIG, CHOLHDL, LDLDIRECT in the last 72 hours.  No results found for: HGBA1C ------------------------------------------------------------------------------------------------------------------ No results for input(s): TSH, T4TOTAL, T3FREE, THYROIDAB in the last 72 hours.  Invalid input(s): FREET3 ------------------------------------------------------------------------------------------------------------------ No results for input(s): VITAMINB12, FOLATE, FERRITIN, TIBC, IRON, RETICCTPCT in the last 72 hours.  Coagulation profile  Recent Labs Lab 07/18/15 0210  INR 0.99    No results for input(s): DDIMER in the last 72 hours.  Cardiac Enzymes No results for input(s): CKMB, TROPONINI, MYOGLOBIN in the last 168 hours.  Invalid input(s): CK ------------------------------------------------------------------------------------------------------------------ No results found for: BNP  Micro Results No results found for this or any previous visit (from the past 240 hour(s)).  Radiology Reports Dg Chest 2 View  07/18/2015  CLINICAL DATA:  Acute onset of hemoptysis. Personal history of lung cancer. Initial encounter. EXAM: CHEST  2 VIEW COMPARISON:  Chest radiograph performed 06/20/2015, and CTA of the chest performed 06/02/2015 FINDINGS: Right perihilar airspace opacification is grossly unchanged in appearance. The left lung remains relatively clear. No pleural effusion or pneumothorax is seen. The heart is normal in size. No acute osseous abnormalities identified. IMPRESSION: Right perihilar airspace opacification again noted, stable in appearance, likely reflecting posttreatment change, though if the patient's hemoptysis persists, bronchoscopy could be considered for further evaluation, as suggested on prior CTA. Electronically Signed    By: Garald Balding M.D.   On: 07/18/2015 02:27     Time Spent in minutes  30   Awanda Wilcock K M.D on 07/18/2015 at 10:07 AM  Between 7am to 7pm - Pager - [phone]  After 7pm go to [email or web address] - password [email or web address]  [facility] -  Office  [phone]

## 2015-07-18 NOTE — Procedures (Signed)
Interventional Radiology Procedure Note  Procedure: right bronchial artery angio with empiric embolization with 2 x 300-522mcrometer embosphere, and 2/3 vial of 500-7026mrometer embosphere.  5 additional segmental artery angiogram. Right CFA access site closed with Exoseal device.  Complications: None Recommendations:    - Do not submerge for 7 days - 3 hours right hip straight  - Routine wound care   Signed,  JaDulcy FannyWaEarleen NewportDO

## 2015-07-18 NOTE — ED Provider Notes (Signed)
CSN: [id number]     Arrival date & time 07/18/15  0055 History  By signing my name below, I, Deborah Foster, attest that this documentation has been prepared under the direction and in the presence of Deborah Chapel, MD . Electronically Signed: Higinio Foster, Scribe. 07/18/2015. 1:45 AM.   Chief Complaint  Patient presents with  . Hemoptysis   The history is provided by the patient. No language interpreter was used.   HPI Comments: Deborah Foster is a 45 y.o. female who presents to the Emergency Department complaining of multiple episodes of gradually worsening, hemopytsis that began yesterday and worsened today. Pt reports hx of stage 4 adenocarcinoma of her right lung. She reports prior radiation therapy to her right lung with subsequent radiation pneumonitis. She states she has also had prior bronchial artery embolization because of her hemoptysis. She notes she saw her IR physician on 6/29 who recommended a repeat procedure after the bronch from 6/22 confirmed a right lower lung hyperemic lesion that was suspected to be bleeding. She states she initially declined until she started bleeding yesterday; she notes her bleeding was intermittent associated with a small amount of chest pain. She states her pain is worsened with coughing. She denies any other complaints.   Past Medical History  Diagnosis Date  . Pneumonia   . Hemoptysis   . Lung mass   . Hypokalemia   . Radiation 08/23/14-09/07/14    Brain/chest and left hip 30 Gy 12 Fx  . lung ca dx'd 07/2014  . Metastasis to brain ([place])   . Metastasis to adrenal gland ([place])   . Bone metastasis ([place])   . URI (upper respiratory infection) 02-02-2015   Past Surgical History  Procedure Laterality Date  . Video bronchoscopy Bilateral 08/09/2014    Procedure: VIDEO BRONCHOSCOPY WITHOUT FLUORO;  Surgeon: [name], MD;  Location: [facility];  Service: Cardiopulmonary;  Laterality: Bilateral;  . Video bronchoscopy Bilateral 07/06/2015    Procedure: VIDEO  BRONCHOSCOPY WITHOUT FLUORO;  Surgeon: [name], MD;  Location: [facility];  Service: Cardiopulmonary;  Laterality: Bilateral;   History reviewed. No pertinent family history. Social History  Substance Use Topics  . Smoking status: Never Smoker   . Smokeless tobacco: Never Used  . Alcohol Use: No   OB History    No data available     Review of Systems 10 systems reviewed and all are negative for acute change except as noted in the HPI.   Allergies  Other and Doxycycline  Home Medications   Prior to Admission medications   Medication Sig Start Date End Date Taking? Authorizing Provider  benzonatate (TESSALON) 100 MG capsule Take 1 capsule (100 mg total) by mouth 3 (three) times daily as needed for cough. 07/05/15  Yes [name], MD  chlorpheniramine-HYDROcodone (TUSSIONEX) 10-8 MG/5ML SUER Take 5 mLs by mouth every 12 (twelve) hours as needed for cough. 07/05/15  Yes [name], MD  clindamycin (CLINDAGEL) 1 % gel Apply 1 application topically 2 (two) times daily.   Yes Historical Provider, MD  Cyanocobalamin (VITAMIN B-12) 2500 MCG SUBL Place 2,500 mcg under the tongue daily.   Yes Historical Provider, MD  erlotinib (TARCEVA) 100 MG tablet Take 1 tablet (100 mg total) by mouth daily. Take on an empty stomach 1 hour before meals or 2 hours after 05/09/15  Yes [name], MD  ferrous sulfate 325 (65 FE) MG EC tablet Take 325 mg by mouth daily with breakfast.   Yes Historical  Provider, MD  folic acid (FOLVITE) 601 MCG tablet Take 800 mcg by mouth daily.   Yes Historical Provider, MD  loratadine (CLARITIN) 10 MG tablet Take 10 mg by mouth daily.   Yes Historical Provider, MD  mometasone-formoterol (DULERA) 200-5 MCG/ACT AERO Inhale 2 puffs into the lungs 2 (two) times daily as needed. Reported on 06/28/2015   Yes Historical Provider, MD  Nutritional Supplements (JUICE PLUS FIBRE PO) Take 1 capsule by mouth daily. [employer] once daily [employer] once  daily [employer] once daily   Yes Historical Provider, MD  TURMERIC PO Take 1 capsule by mouth 2 (two) times daily. Turmeric Superior 750 mg po bid   Yes Historical Provider, MD  folic acid (FOLVITE) 1 MG tablet Take 1 tablet (1 mg total) by mouth daily. Patient not taking: Reported on 07/18/2015 06/06/15   [name], MD  predniSONE (DELTASONE) 10 MG tablet Take 2 tablets (20 mg total) by mouth daily with breakfast. Patient not taking: Reported on 07/14/2015 06/20/15   [name], NP  vitamin B-12 1000 MCG tablet Take 1 tablet (1,000 mcg total) by mouth daily. Patient not taking: Reported on 07/18/2015 06/06/15   [name], MD   BP 117/77 mmHg  Pulse 76  Temp(Src) 97.9 F (36.6 C) (Oral)  Resp 20  SpO2 96% Physical Exam  Constitutional: She is oriented to person, place, and time. She appears well-developed and well-nourished.  HENT:  Head: Normocephalic and atraumatic.  Eyes: Conjunctivae are normal. Pupils are equal, round, and reactive to light. Right eye exhibits no discharge. Left eye exhibits no discharge. No scleral icterus.  Neck: Normal range of motion. No JVD present. No tracheal deviation present.  Pulmonary/Chest: Effort normal. No stridor.  Neurological: She is alert and oriented to person, place, and time. Coordination normal.  Psychiatric: She has a normal mood and affect. Her behavior is normal. Judgment and thought content normal.  Nursing note and vitals reviewed.  ED Course  Procedures  DIAGNOSTIC STUDIES:  Oxygen Saturation is 96% on RA, normal by my interpretation.    COORDINATION OF CARE:  1:43 AM Discussed treatment Foster with pt at bedside and pt agreed to Foster.  Labs Review Labs Reviewed  CBC - Abnormal; Notable for the following:    Platelets 410 (*)    All other components within normal limits  BASIC METABOLIC PANEL - Abnormal; Notable for the following:    Glucose, Bld 113 (*)    Calcium 8.6 (*)    All other components within normal limits   PROTIME-INR  TYPE AND SCREEN    Imaging Review Dg Chest 2 View  07/18/2015  CLINICAL DATA:  Acute onset of hemoptysis. Personal history of lung cancer. Initial encounter. EXAM: CHEST  2 VIEW COMPARISON:  Chest radiograph performed 06/20/2015, and CTA of the chest performed 06/02/2015 FINDINGS: Right perihilar airspace opacification is grossly unchanged in appearance. The left lung remains relatively clear. No pleural effusion or pneumothorax is seen. The heart is normal in size. No acute osseous abnormalities identified. IMPRESSION: Right perihilar airspace opacification again noted, stable in appearance, likely reflecting posttreatment change, though if the patient's hemoptysis persists, bronchoscopy could be considered for further evaluation, as suggested on prior CTA. Electronically Signed   By: Garald Balding M.D.   On: 07/18/2015 02:27   I have personally reviewed and evaluated these images and lab results as part of my medical decision-making.    MDM   Final diagnoses:  Hemoptysis    The patient  has not had any further coughing up blood in the emergency department, her vital signs are unremarkable, her labs have overall not changed and are stable in appearance. I have seen her chest x-ray and other than the right. No other infiltrate which has been seen in the past there is no other acute changes. I have discussed her care with the hospitalist Dr. Blaine Hamper who will admit.  I personally performed the services described in this documentation, which was scribed in my presence. The recorded information has been reviewed and is accurate.       Deborah Chapel, MD 07/18/15 [phone]

## 2015-07-18 NOTE — Telephone Encounter (Signed)
Katharine Look RN on 4th floor [facility] called to let us know the pt is admitted to room 1412 as of today.

## 2015-07-18 NOTE — Sedation Documentation (Signed)
5Fr sheath removed for R femoral artery by Dr. Earleen Newport.  Hemostasis achieved using Exoseal closure device.  R groin level 0, 3+ RDP.

## 2015-07-18 NOTE — Progress Notes (Signed)
Patient ID: Deborah Foster, female   DOB: 1970-12-29, 45 y.o.   MRN: [id number]    Referring Physician(s): Niu,X  Supervising Physician: Corrie Mckusick  Patient Status:  Inpatient  Chief Complaint:  Recurrent hemoptysis/lung cancer  Subjective: Deborah Foster is a 45 y.o. female who is well-known to our service. She has a diagnosis of stage IV adenocarcinoma of the right lung, status post right chest radiation. She has had intermittent hemoptysis most recently in May and June. Previously, she had acute hemoptysis which required bronchial artery embolization 08/10/2014. Because of the 2 recent episodes of hemoptysis, she had a repeat bronchoscopy 07/06/2015. This demonstrated a right lower lobe lesion within the medial basal and anterior basal segments with friable mucosa but no current active bleeding. However, based on the appearance of the airway mucosa, the hemoptysis was felt to be most likely from the right lower lobe lesion as before. She was recently seen in follow-up by Dr. Annamaria Boots on 07/14/15.  At that time decision was made to wait until patient saw Dr. Inda Merlin on 07/26/15 before any further intervention, however she was readmitted to the hospital on 7/3 with recurrent hemoptysis. Request has now been received for repeat bronchial arteriogram with possible embolization. Currently denies fever, chest pain, abdominal/back pain, nausea, vomiting. She does have occasional cough with hemoptysis, occasional dyspnea and intermittent headache.   Allergies: Other and Doxycycline  Medications: Prior to Admission medications   Medication Sig Start Date End Date Taking? Authorizing Provider  benzonatate (TESSALON) 100 MG capsule Take 1 capsule (100 mg total) by mouth 3 (three) times daily as needed for cough. 07/05/15  Yes [name], MD  chlorpheniramine-HYDROcodone (TUSSIONEX) 10-8 MG/5ML SUER Take 5 mLs by mouth every 12 (twelve) hours as needed for cough. 07/05/15  Yes [name], MD    clindamycin (CLINDAGEL) 1 % gel Apply 1 application topically 2 (two) times daily.   Yes Historical Provider, MD  Cyanocobalamin (VITAMIN B-12) 2500 MCG SUBL Place 2,500 mcg under the tongue daily.   Yes Historical Provider, MD  erlotinib (TARCEVA) 100 MG tablet Take 1 tablet (100 mg total) by mouth daily. Take on an empty stomach 1 hour before meals or 2 hours after 05/09/15  Yes [name], MD  ferrous sulfate 325 (65 FE) MG EC tablet Take 325 mg by mouth daily with breakfast.   Yes Historical Provider, MD  folic acid (FOLVITE) 277 MCG tablet Take 800 mcg by mouth daily.   Yes Historical Provider, MD  loratadine (CLARITIN) 10 MG tablet Take 10 mg by mouth daily.   Yes Historical Provider, MD  mometasone-formoterol (DULERA) 200-5 MCG/ACT AERO Inhale 2 puffs into the lungs 2 (two) times daily as needed. Reported on 06/28/2015   Yes Historical Provider, MD  Nutritional Supplements (JUICE PLUS FIBRE PO) Take 1 capsule by mouth daily. [employer] once daily [employer] once daily [employer] once daily   Yes Historical Provider, MD  TURMERIC PO Take 1 capsule by mouth 2 (two) times daily. Turmeric Superior 750 mg po bid   Yes Historical Provider, MD  folic acid (FOLVITE) 1 MG tablet Take 1 tablet (1 mg total) by mouth daily. Patient not taking: Reported on 07/18/2015 06/06/15   [name], MD  predniSONE (DELTASONE) 10 MG tablet Take 2 tablets (20 mg total) by mouth daily with breakfast. Patient not taking: Reported on 07/14/2015 06/20/15   [name], NP  vitamin B-12 1000 MCG tablet Take 1 tablet (1,000 mcg total) by mouth  daily. Patient not taking: Reported on 07/18/2015 06/06/15   [name], MD     Vital Signs: BP 127/112 mmHg  Pulse 118  Temp(Src) 97.6 F (36.4 C) (Axillary)  Resp 20  Ht 5' (1.524 m)  Wt 104 lb 6.4 oz (47.356 kg)  BMI 20.39 kg/m2  SpO2 100%  Physical Exam patient awake, alert. Chest clear to auscultation bilaterally. Heart with regular rate and rhythm.  Abdomen soft, positive bowel sounds, nontender. Lower extremities with no edema.  Imaging: Dg Chest 2 View  07/18/2015  CLINICAL DATA:  Acute onset of hemoptysis. Personal history of lung cancer. Initial encounter. EXAM: CHEST  2 VIEW COMPARISON:  Chest radiograph performed 06/20/2015, and CTA of the chest performed 06/02/2015 FINDINGS: Right perihilar airspace opacification is grossly unchanged in appearance. The left lung remains relatively clear. No pleural effusion or pneumothorax is seen. The heart is normal in size. No acute osseous abnormalities identified. IMPRESSION: Right perihilar airspace opacification again noted, stable in appearance, likely reflecting posttreatment change, though if the patient's hemoptysis persists, bronchoscopy could be considered for further evaluation, as suggested on prior CTA. Electronically Signed   By: Garald Balding M.D.   On: 07/18/2015 02:27    Labs:  CBC:  Recent Labs  06/28/15 0930 07/05/15 1420 07/18/15 0210 07/18/15 0453  WBC 12.0* 14.1* 7.2 6.9  HGB 13.5 13.3 13.9 13.1  HCT 40.9 39.1 41.5 38.6  PLT 469* 493.0* 410* 383    COAGS:  Recent Labs  08/10/14 1150 04/30/15 1215 07/18/15 0210 07/18/15 0453  INR 1.17 0.96 0.99  --   APTT  --   --   --  39*    BMP:  Recent Labs  05/05/15 1030  06/02/15 1052 06/28/15 0930 07/18/15 0210 07/18/15 0453  NA 139  < > 139 139 136 137  K 3.9  < > 3.9 3.6 3.5 3.8  CL 105  --  107  --  103 105  CO2 26  < > '27 26 26 25  '$ GLUCOSE 88  < > 95 106 113* 89  BUN 8  < > 8 11.'3 9 9  '$ CALCIUM 9.0  < > 9.3 9.0 8.6* 8.3*  CREATININE 0.53  < > 0.49 0.6 0.58 0.45  GFRNONAA >60  --  >60  --  >60 >60  GFRAA >60  --  >60  --  >60 >60  < > = values in this interval not displayed.  LIVER FUNCTION TESTS:  Recent Labs  04/30/15 1215 05/05/15 1030 05/26/15 1030 06/28/15 0930 07/18/15 0453  BILITOT 0.8  --  0.72 0.67 0.7  AST 20  --  '17 14 20  '$ ALT 21  --  '18 19 18  '$ ALKPHOS 89  --  80 50 46  PROT  6.9  --  7.1 6.6 6.5  ALBUMIN 3.8 3.9 3.7 3.4* 3.7    Assessment and Plan: Patient with known metastatic stage IV lung adenocarcinoma with prior radiation to the right lung. Recurrent episodes of hemoptysis, status post bronchial artery embolization 08/10/14. Hemoptysis has persisted and patient admitted on 07/18/15. Recently seen in follow-up by Dr. Annamaria Boots on 07/14/15 who recommended repeat bronchial artery embolization if hemoptysis recurred. Request now received for above procedure. Case has also been reviewed by Dr. Earleen Newport. Labs reviewed. Details/risks of procedure, including but not limited to, internal bleeding, infection, nontarget embolization discussed with patient via interpreter with her understanding and consent. Tentative plan is to proceed with case later today.    Electronically Signed: D.  Rowe Robert 07/18/2015, 10:04 AM   I spent a total of 30 minutes at the the patient's bedside AND on the patient's hospital floor or unit, greater than 50% of which was counseling/coordinating care for bronchial arteriogram with possible embolization.

## 2015-07-18 NOTE — Sedation Documentation (Signed)
Gauze/tegaderm bandage applied to R femoral artery.  R groin level 0, 3+RDP.

## 2015-07-18 NOTE — Progress Notes (Signed)
Initial Nutrition Assessment  DOCUMENTATION CODES:   Not applicable  INTERVENTION:  - Diet advancement as medically feasible. - RD will continue to monitor for needs.  NUTRITION DIAGNOSIS:   Increased nutrient needs related to cancer and cancer related treatments, catabolic illness as evidenced by estimated needs.  GOAL:   Patient will meet greater than or equal to 90% of their needs  MONITOR:   Diet advancement, Weight trends, Labs, I & O's  REASON FOR ASSESSMENT:   Malnutrition Screening Tool  ASSESSMENT:   45 y.o. female with medical history significant of metastasized NSCLC-stage IV, s/p of radiation therapy, pneumonitis, hemoptysis, who presents with hemoptysis.  Pt seen for MST. Pt has been NPO since admission and unable to meet needs. Chart indicates pt is Vietnamese-speaking but interpreter was not needed for RD to have discussion with pt. Pt states that hemoptysis began yesterday with no associated nausea or abdominal pain. She states that vomiting happens after coughing. Pt states that due to this and procedure scheduled for today she has been unable to eat or drink. Spoke with RN who confirms procedure scheduled for later today.   Pt states that PTA her appetite was poor due to taste alteration (lack of taste, bland taste) after beginning oral chemo regimen. Pt states that this medication is taken once/day at home. She denies experiencing any mouth pain with eating/drinking or throat pain with swallowing. She does sometimes feel as though foods or drinks are getting stuck in her throat.  Pt states that PTA she was losing weight but is unsure of time frame or amount of weight lost. She states CBW of 105 lbs. Chart review indicates that pt weighed 99 lbs 3 months ago and that current weight is 104 lbs. Will monitor weight trends during admission. Unable to perform physical assessment at this time as pt with ongoing, active vomiting of blood throughout visit; will complete  as able at follow-up.  Unable to meet needs at this time. Medications reviewed. IVF: NS @ 75 mL/hr. Labs reviewed; Ca: 8.3 mg/dL.   Diet Order:  Diet NPO time specified Except for: Sips with Meds  Skin:  Reviewed, no issues  Last BM:  7/2  Height:   Ht Readings from Last 1 Encounters:  07/18/15 5' (1.524 m)    Weight:   Wt Readings from Last 1 Encounters:  07/18/15 104 lb 6.4 oz (47.356 kg)    Ideal Body Weight:  45.45 kg (kg)  BMI:  Body mass index is 20.39 kg/(m^2).  Estimated Nutritional Needs:   Kcal:  1420-1660 (30-35 kcal/kg)  Protein:  70-85 grams  Fluid:  >/= 1.6 L/day  EDUCATION NEEDS:   No education needs identified at this time     Jarome Matin, MS, RD, LDN Inpatient Clinical Dietitian Pager # [phone] After hours/weekend pager # [phone]

## 2015-07-18 NOTE — Progress Notes (Signed)
Message left with RN Tye Maryland at the [facility] to inform Dr. Julien Nordmann of patient's admission as per Care Order.  Lind Guest, RN

## 2015-07-18 NOTE — Progress Notes (Signed)
Pt called this RN into room and said that she coughed up blood. Approximately 30cc BRB with clots noted in cup. VSS. Slightly hypotensive (see charted VS).  Femoral puncture site dressing clean, dry, intact and periwound area soft to palpation. Text paged Dr. Candiss Norse and informed him of above. Verbal order for 250cc NS bolus taken. Vida Roller, RN informed.  Lind Guest, RN

## 2015-07-18 NOTE — H&P (Signed)
History and Physical    Deborah Foster LKG:[id number] DOB: October 10, 1970 DOA: 07/18/2015  Referring MD/NP/PA:   PCP: [name], MD   Patient coming from:  The patient is coming from home.  At baseline, pt is independent for most of ADL.   Chief Complaint: Hemoptysis  HPI: Deborah Foster is a 45 y.o. female with medical history significant of metastasized NSCLC-stage IV, s/p of radiation therapy, pneumonitis, hemoptysis, who presents with hemoptysis.  Patient has stage IV metastasized NSCLC (metastasized to brain, bone, adrenal gland). She had radiation therapy, which caused pneumonitis and hemoptysis. She is s/p IR embolization of RLL superior segment in 07/2014. She was hospitalized in May from 5/18 to 5/22 due to hemoptysis, which resolved with steroid treatment. She states that she continues to have hemoptysis recently. She was seen by Dr. Waunita Schooner on 6/21 and had a repeat bronchoscopy 07/06/2015. This demonstrated a right lower lobe lesion within the medial basal and anterior basal segments with friable mucosa, but no current active bleeding. She was seen by IR, Dr. Annamaria Boots on 07/14/15, who recommended to repeat bronchial artery angiogram and embolization. Per Dr. Fritz Pickerel note, the pt would like to wait until she sees Dr. Earlie Server on 07/26/2015. She states that she has worsening hemoptysis since yesterday when she had 2 episode of hemoptysis with bright red blood. She had 7 episode of hemoptysis, with 1-2 teaspoons of bright red blood today. She has chest discomfort, but denies chest pain, fever or chills. She has mild cough and shortness of breath. She has nausea, and occasional vomiting, but no diarrhea or abdominal pain. Patient denies symptoms of UTI or unilateral weakness. Patient states that she wants to do embolism as recommended by Dr. Annamaria Boots.   ED Course: pt was found to have hemoglobin 13.9, INR 0.99, WBC 7.2, platelet 410, temperature normal, no tachycardia, oxygen saturation 96% on room air,  electrolytes and renal function okay. Chest x-ray showed right perihilar airspace opacification again noted, stable in appearance, likely reflecting posttreatment change. Pt is admitted to inpatient for further interventional treatment.  Review of Systems:   General: no fevers, chills, no changes in body weight, has fatigue HEENT: no blurry vision, hearing changes or sore throat Pulm: has dyspnea, coughing, and hemoptysis. no wheezing CV: no chest pain, no palpitations Abd: has nausea, vomiting, no abdominal pain, diarrhea, constipation GU: no dysuria, burning on urination, increased urinary frequency, hematuria  Ext: no leg edema Neuro: no unilateral weakness, numbness, or tingling, no vision change or hearing loss Skin: no rash MSK: No muscle spasm, no deformity, no limitation of range of movement in spin Heme: No easy bruising.  Travel history: No recent long distant travel.  Allergy:  Allergies  Allergen Reactions  . Other Other (See Comments)    Patient states that there is some she takes that makes her throat "cry" AND SCRATCHY   . Doxycycline Nausea And Vomiting    Past Medical History  Diagnosis Date  . Pneumonia   . Hemoptysis   . Lung mass   . Hypokalemia   . Radiation 08/23/14-09/07/14    Brain/chest and left hip 30 Gy 12 Fx  . lung ca dx'd 07/2014  . Metastasis to brain ([place])   . Metastasis to adrenal gland ([place])   . Bone metastasis ([place])   . URI (upper respiratory infection) 02-02-2015    Past Surgical History  Procedure Laterality Date  . Video bronchoscopy Bilateral 08/09/2014    Procedure: VIDEO BRONCHOSCOPY WITHOUT FLUORO;  Surgeon: Ronie Spies  Lake Bells, MD;  Location: [facility];  Service: Cardiopulmonary;  Laterality: Bilateral;  . Video bronchoscopy Bilateral 07/06/2015    Procedure: VIDEO BRONCHOSCOPY WITHOUT FLUORO;  Surgeon: [name], MD;  Location: [facility];  Service: Cardiopulmonary;  Laterality: Bilateral;    Social History:  reports  that she has never smoked. She has never used smokeless tobacco. She reports that she does not drink alcohol or use illicit drugs.  Family History:  Family History  Problem Relation Age of Onset  . Hyperlipidemia Mother      Prior to Admission medications   Medication Sig Start Date End Date Taking? Authorizing Provider  benzonatate (TESSALON) 100 MG capsule Take 1 capsule (100 mg total) by mouth 3 (three) times daily as needed for cough. 07/05/15  Yes [name], MD  chlorpheniramine-HYDROcodone (TUSSIONEX) 10-8 MG/5ML SUER Take 5 mLs by mouth every 12 (twelve) hours as needed for cough. 07/05/15  Yes [name], MD  clindamycin (CLINDAGEL) 1 % gel Apply 1 application topically 2 (two) times daily.   Yes Historical Provider, MD  Cyanocobalamin (VITAMIN B-12) 2500 MCG SUBL Place 2,500 mcg under the tongue daily.   Yes Historical Provider, MD  erlotinib (TARCEVA) 100 MG tablet Take 1 tablet (100 mg total) by mouth daily. Take on an empty stomach 1 hour before meals or 2 hours after 05/09/15  Yes [name], MD  ferrous sulfate 325 (65 FE) MG EC tablet Take 325 mg by mouth daily with breakfast.   Yes Historical Provider, MD  folic acid (FOLVITE) 454 MCG tablet Take 800 mcg by mouth daily.   Yes Historical Provider, MD  loratadine (CLARITIN) 10 MG tablet Take 10 mg by mouth daily.   Yes Historical Provider, MD  mometasone-formoterol (DULERA) 200-5 MCG/ACT AERO Inhale 2 puffs into the lungs 2 (two) times daily as needed. Reported on 06/28/2015   Yes Historical Provider, MD  Nutritional Supplements (JUICE PLUS FIBRE PO) Take 1 capsule by mouth daily. [employer] once daily [employer] once daily [employer] once daily   Yes Historical Provider, MD  TURMERIC PO Take 1 capsule by mouth 2 (two) times daily. Turmeric Superior 750 mg po bid   Yes Historical Provider, MD  folic acid (FOLVITE) 1 MG tablet Take 1 tablet (1 mg total) by mouth daily. Patient not taking: Reported on  07/18/2015 06/06/15   [name], MD  predniSONE (DELTASONE) 10 MG tablet Take 2 tablets (20 mg total) by mouth daily with breakfast. Patient not taking: Reported on 07/14/2015 06/20/15   [name], NP  vitamin B-12 1000 MCG tablet Take 1 tablet (1,000 mcg total) by mouth daily. Patient not taking: Reported on 07/18/2015 06/06/15   [name], MD    Physical Exam: Filed Vitals:   07/18/15 0101 07/18/15 0406  BP: 117/77 106/72  Pulse: 76 80  Temp: 97.9 F (36.6 C) 97.5 F (36.4 C)  TempSrc: Oral Oral  Resp: 20 20  SpO2: 96% 97%   General: Not in acute distress HEENT:       Eyes: PERRL, EOMI, no scleral icterus.       ENT: No discharge from the ears and nose, no pharynx injection, no tonsillar enlargement.        Neck: No JVD, no bruit, no mass felt. Heme: No neck lymph node enlargement. Cardiac: S1/S2, RRR, No murmurs, No gallops or rubs. Pulm:  No rales, wheezing, rhonchi or rubs. Abd: Soft, nondistended, nontender, no rebound pain, no organomegaly, BS present. GU: No  hematuria Ext: No pitting leg edema bilaterally. 2+DP/PT pulse bilaterally. Musculoskeletal: No joint deformities, No joint redness or warmth, no limitation of ROM in spin. Skin: has facial acnes  Neuro: Alert, oriented X3, cranial nerves II-XII grossly intact, moves all extremities normally.  Psych: Patient is not psychotic, no suicidal or hemocidal ideation.  Labs on Admission: I have personally reviewed following labs and imaging studies  CBC:  Recent Labs Lab 07/18/15 0210  WBC 7.2  HGB 13.9  HCT 41.5  MCV 92.8  PLT 332*   Basic Metabolic Panel:  Recent Labs Lab 07/18/15 0210  NA 136  K 3.5  CL 103  CO2 26  GLUCOSE 113*  BUN 9  CREATININE 0.58  CALCIUM 8.6*   GFR: Estimated Creatinine Clearance: 65.1 mL/min (by C-G formula based on Cr of 0.58). Liver Function Tests: No results for input(s): AST, ALT, ALKPHOS, BILITOT, PROT, ALBUMIN in the last 168 hours. No results for input(s):  LIPASE, AMYLASE in the last 168 hours. No results for input(s): AMMONIA in the last 168 hours. Coagulation Profile:  Recent Labs Lab 07/18/15 0210  INR 0.99   Cardiac Enzymes: No results for input(s): CKTOTAL, CKMB, CKMBINDEX, TROPONINI in the last 168 hours. BNP (last 3 results) No results for input(s): PROBNP in the last 8760 hours. HbA1C: No results for input(s): HGBA1C in the last 72 hours. CBG: No results for input(s): GLUCAP in the last 168 hours. Lipid Profile: No results for input(s): CHOL, HDL, LDLCALC, TRIG, CHOLHDL, LDLDIRECT in the last 72 hours. Thyroid Function Tests: No results for input(s): TSH, T4TOTAL, FREET4, T3FREE, THYROIDAB in the last 72 hours. Anemia Panel: No results for input(s): VITAMINB12, FOLATE, FERRITIN, TIBC, IRON, RETICCTPCT in the last 72 hours. Urine analysis:    Component Value Date/Time   COLORURINE YELLOW 02/04/2015 1630   APPEARANCEUR CLEAR 02/04/2015 1630   LABSPEC 1.007 02/04/2015 1630   LABSPEC 1.025 09/24/2014 1053   PHURINE 7.5 02/04/2015 1630   PHURINE 6.0 09/24/2014 1053   GLUCOSEU NEGATIVE 02/04/2015 1630   GLUCOSEU Negative 09/24/2014 1053   HGBUR TRACE* 02/04/2015 1630   HGBUR Moderate 09/24/2014 1053   BILIRUBINUR NEGATIVE 02/04/2015 1630   BILIRUBINUR Negative 09/24/2014 1053   KETONESUR NEGATIVE 02/04/2015 1630   KETONESUR Negative 09/24/2014 1053   PROTEINUR NEGATIVE 02/04/2015 1630   PROTEINUR 100 09/24/2014 1053   UROBILINOGEN 0.2 09/24/2014 1053   UROBILINOGEN 0.2 08/13/2014 1059   NITRITE NEGATIVE 02/04/2015 1630   NITRITE Negative 09/24/2014 1053   LEUKOCYTESUR TRACE* 02/04/2015 1630   LEUKOCYTESUR Small 09/24/2014 1053   Sepsis Labs: '@LABRCNTIP'$ (procalcitonin:4,lacticidven:4) )No results found for this or any previous visit (from the past 240 hour(s)).   Radiological Exams on Admission: Dg Chest 2 View  07/18/2015  CLINICAL DATA:  Acute onset of hemoptysis. Personal history of lung cancer. Initial  encounter. EXAM: CHEST  2 VIEW COMPARISON:  Chest radiograph performed 06/20/2015, and CTA of the chest performed 06/02/2015 FINDINGS: Right perihilar airspace opacification is grossly unchanged in appearance. The left lung remains relatively clear. No pleural effusion or pneumothorax is seen. The heart is normal in size. No acute osseous abnormalities identified. IMPRESSION: Right perihilar airspace opacification again noted, stable in appearance, likely reflecting posttreatment change, though if the patient's hemoptysis persists, bronchoscopy could be considered for further evaluation, as suggested on prior CTA. Electronically Signed   By: Garald Balding M.D.   On: 07/18/2015 02:27     EKG: Not done in ED, will get one.   Assessment/Plan Principal Problem:  Hemoptysis Active Problems:   Adrenal mass, left (HCC)   Brain metastases (HCC)   Palliative care encounter   Non-small cell carcinoma of lung, stage 4 (HCC)   Bone disease, metabolic   Radiation pneumonitis with hemoptysis   Hemoptysis: pt had bronchoscopy 07/06/2015, which demonstrated a right lower lobe lesion within the medial basal and anterior basal segments with friable mucosa, but no current active bleeding. She was seen by IR, Dr. Annamaria Boots on 07/14/15, who recommended to repeat bronchial artery angiogram and embolization. Now pt wants to do the embolism today. Currently hemodynamically stable. Hemoglobin stable. Airway is protected.  -will admitted to telemetry bed as inpatient -NPO now -When necessary Zofran for nausea -IV fluids: Normal saline 75 mL per hour -INR/PTT/type & screen -please call IR for bronchial artery angiogram and embolization in AM  Metastasized non-small cell carcinoma of lung, stage 4 ([place]): Followed up by Dr. Julien Nordmann, last seen was on 06/28/15. Currently on Tarceva -continue Tarceva -f/u with Dr. Julien Nordmann  Radiation pneumonitis with hemoptysis: has mild cough and SOB -continue home Tussionex, Dulera  inhaler prn   DVT ppx: SCD Code Status: Full code (discussed, patient wants to be full code) Family Communication: None at bed side.   Disposition Plan:  Anticipate discharge back to previous home environment Consults called:  none Admission status: tele/inpt  Date of Service 07/18/2015    Ivor Costa [facility] Pager [phone]  If 7PM-7AM, please contact night-coverage [email or web address] Password [email or web address] 07/18/2015, 4:13 AM

## 2015-07-18 NOTE — ED Notes (Signed)
Pt has lung cancer and states she started coughing up some blood yesterday  Pt states tonight she coughed up a large amount of bright red blood  Pt states she has a little discomfort but no pain

## 2015-07-18 NOTE — Progress Notes (Signed)
PHARMACIST - PHYSICIAN ORDER COMMUNICATION  CONCERNING: P&T Medication Policy on Herbal Medications  DESCRIPTION:  This patient's order for:  Juice Plus Fibre & Turmeric  has been noted.  This product(s) is classified as an "herbal" or natural product. Due to a lack of definitive safety studies or FDA approval, nonstandard manufacturing practices, plus the potential risk of unknown drug-drug interactions while on inpatient medications, the Pharmacy and Therapeutics Committee does not permit the use of "herbal" or natural products of this type within [facility].   ACTION TAKEN: The pharmacy department is unable to verify this order at this time and your patient has been informed of this safety policy. Please reevaluate patient's clinical condition at discharge and address if the herbal or natural product(s) should be resumed at that time.  Leone Haven, PharmD

## 2015-07-19 LAB — BASIC METABOLIC PANEL
ANION GAP: 5 (ref 5–15)
BUN: 9 mg/dL (ref 6–20)
CHLORIDE: 109 mmol/L (ref 101–111)
CO2: 23 mmol/L (ref 22–32)
Calcium: 7.5 mg/dL — ABNORMAL LOW (ref 8.9–10.3)
Creatinine, Ser: 0.4 mg/dL — ABNORMAL LOW (ref 0.44–1.00)
GFR calc non Af Amer: >60 mL/min (ref >60)
Glucose, Bld: 88 mg/dL (ref 65–99)
POTASSIUM: 3.8 mmol/L (ref 3.5–5.1)
SODIUM: 137 mmol/L (ref 135–145)

## 2015-07-19 LAB — CBC
HCT: 36 % (ref 36.0–46.0)
HEMOGLOBIN: 12.2 g/dL (ref 12.0–15.0)
MCH: 31.9 pg (ref 26.0–34.0)
MCHC: 33.9 g/dL (ref 30.0–36.0)
MCV: 94 fL (ref 78.0–100.0)
Platelets: 355 10*3/uL (ref 150–400)
RBC: 3.83 MIL/uL — AB (ref 3.87–5.11)
RDW: 13.4 % (ref 11.5–15.5)
WBC: 11.1 10*3/uL — ABNORMAL HIGH (ref 4.0–10.5)

## 2015-07-19 LAB — GLUCOSE, CAPILLARY: GLUCOSE-CAPILLARY: 90 mg/dL (ref 65–99)

## 2015-07-19 MED ORDER — BENZONATATE 100 MG PO CAPS
100.0000 mg | ORAL_CAPSULE | Freq: Three times a day (TID) | ORAL | Status: DC | PRN
  Administered 2015-07-19 – 2015-07-21 (×5): 100 mg via ORAL
  Filled 2015-07-19 (×6): qty 1

## 2015-07-19 NOTE — Progress Notes (Signed)
PROGRESS NOTE                                                                                                                                                                                                             Patient Demographics:    Deborah Foster, is a 45 y.o. female, DOB - 27-Nov-1970, NOB:[id number]  Admit date - 07/18/2015   Admitting Physician Ivor Costa, MD  Outpatient Primary MD for the patient is [name], MD  LOS - 1  Chief Complaint  Patient presents with  . Hemoptysis       Brief Narrative    Deborah Foster is a 45 y.o. female with medical history significant of metastasized NSCLC-stage IV, s/p of radiation therapy, pneumonitis, hemoptysis, who presents with hemoptysis.  Patient has stage IV metastasized NSCLC (metastasized to brain, bone, adrenal gland). She had radiation therapy, which caused pneumonitis and hemoptysis. She is s/p IR embolization of RLL superior segment in 07/2014. She was hospitalized in May from 5/18 to 5/22 due to hemoptysis, which resolved with steroid treatment. She states that she continues to have hemoptysis recently. She was seen by Dr. Waunita Schooner on 6/21 and had a repeat bronchoscopy 07/06/2015.   After consultations between pulmonary and oncology patient was admitted for an elective second embolization procedure which happened on 07/18/2015 unfortunately patient is still having intermittent hemoptysis, she remains at risk for sudden death, I have consulted oncology formally on 07/19/2015 to provide further recommendations if any, have also discussed her case with pulmonary who has nothing to offer, palliative care has also been called.   Subjective:    Deborah Foster today has, No headache, No chest pain, No abdominal pain - No Nausea, No new weakness tingling or numbness, No Cough - SOB. Mild hemoptysis.    Assessment  & Plan :     1. Right Middle lobe stage IV metastasized NSCLC  Adenocarcinoma (metastasized to brain, bone, adrenal gland) with Recurrent hemoptysis likely due to combination of her malignancy and some element of radiation pneumonitis -   She is under the care of oncologist Dr. Julien Nordmann, pulmonary Dr. Lake Bells and IR physician Dr. Annamaria Boots has undergone embolization in the past and underwent second embolization procedure on 07/18/2015. Unfortunately after the procedure she still having intermittent hemoptysis, I had already discussed her care with her primary oncologist Dr. Julien Nordmann  on 07/18/2015.  Discussed the case with her oncologist on call Dr. Annamaria Boots, she will evaluate the patient and give any further recommendations, also discussed her care with pulmonologist Dr. Marcello Moores saw me on 07/19/2015 he has nothing to offer, he advises that we involve palliative care as patient at risk for massive proptosis and death. We'll request palliative care to evaluate as well.     2. For her above-mentioned cancer she is under the care of Dr. Julien Nordmann and she is currently undergoing Tarceva and Xgeva treatment.   Family Communication  :  None present  Code Status :  Full  Diet : Regular after IR procedure  Disposition Plan  : Stay inpt  Consults  :  IR, Oncology, palliative care, discussed with Dr. Chase Caller pulmonary on 07/19/2015 he has nothing to offer  Procedures  :      DVT Prophylaxis  :  SCDs    Lab Results  Component Value Date   PLT 355 07/19/2015    Inpatient Medications  Scheduled Meds: . clindamycin  1 application Topical BID  . vitamin B-12  2,500 mcg Oral Daily  . erlotinib  100 mg Oral Daily  . ferrous sulfate  325 mg Oral Q breakfast  . folic acid  1 mg Oral Daily  . loratadine  10 mg Oral Daily  . sodium chloride flush  3 mL Intravenous Q12H   Continuous Infusions: . sodium chloride 75 mL/hr at 07/19/15 0557   PRN Meds:.[DISCONTINUED] acetaminophen **OR** acetaminophen, chlorpheniramine-HYDROcodone, mometasone-formoterol  Antibiotics  :     Anti-infectives    None         Objective:   Filed Vitals:   07/18/15 1610 07/18/15 1635 07/18/15 2016 07/19/15 0607  BP: 102/61 92/54 100/56 83/48  Pulse: 96 96 98 92  Temp:   98.6 F (37 C) 98.5 F (36.9 C)  TempSrc:  Oral Axillary Oral  Resp: '17  16 14  '$ Height:      Weight:      SpO2: 100% 99% 98% 100%    Wt Readings from Last 3 Encounters:  07/18/15 47.356 kg (104 lb 6.4 oz)  07/14/15 48.081 kg (106 lb)  07/06/15 48.081 kg (106 lb)     Intake/Output Summary (Last 24 hours) at 07/19/15 1113 Last data filed at 07/19/15 0557  Gross per 24 hour  Intake 1768.75 ml  Output    800 ml  Net 968.75 ml     Physical Exam  Awake Alert, Oriented X 3, No new F.N deficits, Normal affect [place].AT,PERRAL Supple Neck,No JVD, No cervical lymphadenopathy appriciated.  Symmetrical Chest wall movement, Good air movement bilaterally, CTAB RRR,No Gallops,Rubs or new Murmurs, No Parasternal Heave +ve B.Sounds, Abd Soft, No tenderness, No organomegaly appriciated, No rebound - guarding or rigidity. No Cyanosis, Clubbing or edema, No new Rash or bruise       Data Review:    CBC  Recent Labs Lab 07/18/15 0210 07/18/15 0453 07/19/15 0509  WBC 7.2 6.9 11.1*  HGB 13.9 13.1 12.2  HCT 41.5 38.6 36.0  PLT 410* 383 355  MCV 92.8 93.0 94.0  MCH 31.1 31.6 31.9  MCHC 33.5 33.9 33.9  RDW 13.2 13.3 13.4    Chemistries   Recent Labs Lab 07/18/15 0210 07/18/15 0453 07/19/15 0509  NA 136 137 137  K 3.5 3.8 3.8  CL 103 105 109  CO2 '26 25 23  '$ GLUCOSE 113* 89 88  BUN '9 9 9  '$ CREATININE 0.58 0.45 0.40*  CALCIUM 8.6*  8.3* 7.5*  AST  --  20  --   ALT  --  18  --   ALKPHOS  --  46  --   BILITOT  --  0.7  --    ------------------------------------------------------------------------------------------------------------------ No results for input(s): CHOL, HDL, LDLCALC, TRIG, CHOLHDL, LDLDIRECT in the last 72 hours.  No results found for:  HGBA1C ------------------------------------------------------------------------------------------------------------------ No results for input(s): TSH, T4TOTAL, T3FREE, THYROIDAB in the last 72 hours.  Invalid input(s): FREET3 ------------------------------------------------------------------------------------------------------------------ No results for input(s): VITAMINB12, FOLATE, FERRITIN, TIBC, IRON, RETICCTPCT in the last 72 hours.  Coagulation profile  Recent Labs Lab 07/18/15 0210  INR 0.99    No results for input(s): DDIMER in the last 72 hours.  Cardiac Enzymes No results for input(s): CKMB, TROPONINI, MYOGLOBIN in the last 168 hours.  Invalid input(s): CK ------------------------------------------------------------------------------------------------------------------ No results found for: BNP  Micro Results No results found for this or any previous visit (from the past 240 hour(s)).  Radiology Reports Dg Chest 2 View  07/18/2015  CLINICAL DATA:  Acute onset of hemoptysis. Personal history of lung cancer. Initial encounter. EXAM: CHEST  2 VIEW COMPARISON:  Chest radiograph performed 06/20/2015, and CTA of the chest performed 06/02/2015 FINDINGS: Right perihilar airspace opacification is grossly unchanged in appearance. The left lung remains relatively clear. No pleural effusion or pneumothorax is seen. The heart is normal in size. No acute osseous abnormalities identified. IMPRESSION: Right perihilar airspace opacification again noted, stable in appearance, likely reflecting posttreatment change, though if the patient's hemoptysis persists, bronchoscopy could be considered for further evaluation, as suggested on prior CTA. Electronically Signed   By: Garald Balding M.D.   On: 07/18/2015 02:27     Time Spent in minutes  30   SINGH,PRASHANT K M.D on 07/19/2015 at 11:13 AM  Between 7am to 7pm - Pager - [phone]  After 7pm go to [email or web address] - password [facility]  [facility] -  Office  [phone]

## 2015-07-19 NOTE — Progress Notes (Signed)
I met briefly with [name] this evening.  Her brother was also present in the room.    On arrival, she asked if I was one of Dr. Worthy Flank partners.  I explained that I was from palliative care team and the goal of our team being involved in her care is to provide support and improve quality of life for both her and her family as she deals with her illness.  She was very polite and receptive, but also was quick to state that she feels that she needs to speak with someone from oncology prior to having a meeting with our team.  She is agreeable to my following up tomorrow.  Deborah Rough, MD [facility] [phone]

## 2015-07-20 ENCOUNTER — Ambulatory Visit
Admit: 2015-07-20 | Discharge: 2015-07-20 | Disposition: A | Discharge status: Home / Self Care | Payer: Medicaid Other | Attending: Radiation Oncology | Admitting: Radiation Oncology

## 2015-07-20 ENCOUNTER — Other Ambulatory Visit: Payer: Self-pay | Admitting: Nurse Practitioner

## 2015-07-20 DIAGNOSIS — Z510 Encounter for antineoplastic radiation therapy: Secondary | ICD-10-CM | POA: Insufficient documentation

## 2015-07-20 DIAGNOSIS — R590 Localized enlarged lymph nodes: Secondary | ICD-10-CM | POA: Insufficient documentation

## 2015-07-20 DIAGNOSIS — C7972 Secondary malignant neoplasm of left adrenal gland: Secondary | ICD-10-CM | POA: Insufficient documentation

## 2015-07-20 DIAGNOSIS — R042 Hemoptysis: Secondary | ICD-10-CM

## 2015-07-20 DIAGNOSIS — C7951 Secondary malignant neoplasm of bone: Secondary | ICD-10-CM | POA: Insufficient documentation

## 2015-07-20 DIAGNOSIS — C3490 Malignant neoplasm of unspecified part of unspecified bronchus or lung: Secondary | ICD-10-CM

## 2015-07-20 DIAGNOSIS — C7931 Secondary malignant neoplasm of brain: Secondary | ICD-10-CM

## 2015-07-20 DIAGNOSIS — Z630 Problems in relationship with spouse or partner: Secondary | ICD-10-CM

## 2015-07-20 DIAGNOSIS — C3431 Malignant neoplasm of lower lobe, right bronchus or lung: Secondary | ICD-10-CM

## 2015-07-20 DIAGNOSIS — C342 Malignant neoplasm of middle lobe, bronchus or lung: Secondary | ICD-10-CM | POA: Insufficient documentation

## 2015-07-20 LAB — CBC
HCT: 34.4 % — ABNORMAL LOW (ref 36.0–46.0)
Hemoglobin: 11.8 g/dL — ABNORMAL LOW (ref 12.0–15.0)
MCH: 32.2 pg (ref 26.0–34.0)
MCHC: 34.3 g/dL (ref 30.0–36.0)
MCV: 93.7 fL (ref 78.0–100.0)
PLATELETS: 347 10*3/uL (ref 150–400)
RBC: 3.67 MIL/uL — AB (ref 3.87–5.11)
RDW: 13.3 % (ref 11.5–15.5)
WBC: 8.3 10*3/uL (ref 4.0–10.5)

## 2015-07-20 LAB — GLUCOSE, CAPILLARY: GLUCOSE-CAPILLARY: 89 mg/dL (ref 65–99)

## 2015-07-20 MED ORDER — DOCUSATE SODIUM 100 MG PO CAPS
100.0000 mg | ORAL_CAPSULE | Freq: Two times a day (BID) | ORAL | Status: DC
  Administered 2015-07-20 – 2015-07-21 (×2): 100 mg via ORAL
  Filled 2015-07-20 (×2): qty 1

## 2015-07-20 NOTE — Progress Notes (Signed)
Patient ID: LEGACY CARRENDER, female   DOB: Nov 21, 1970, 45 y.o.   MRN: [id number] Pt currently in rad onc; per nursing hemoptysis has diminished ; s/p second bronchial art embo 7/3 VSS; AF; hgb 11.8; WBC 8.3; creat ok Groin access site ok Plans as per oncology/IM/pall care

## 2015-07-20 NOTE — Progress Notes (Signed)
PROGRESS NOTE        PATIENT DETAILS Name: Deborah Foster Age: 45 y.o. Sex: female Date of Birth: 1970/07/12 Admit Date: 07/18/2015 Admitting Physician Ivor Costa, MD [id number] [name], MD  Brief Narrative: Patient is a 45 y.o. female with history of metastatic non-small cell cancer of the lung (completed radiation treatment on 08/2014, and currently on Tarceva daily and Xgeva monthly) admitted for hemoptysis. Underwent right bronchial artery angiogram with embolization on 07/18/15. Episodes of hemoptysis has been slowly decreasing post embolization but nonetheless continue, seen by radiation oncology and medical oncology, plans are to start palliative radiation to see if hemoptysis can be controlled further.  Subjective: Had one episode of hemoptysis earlier this morning-feels that episodes of hemoptysis have started to decrease and the amount is just scant.  Assessment/Plan: Principal Problem: Hemoptysis: Likely secondary to underlying malignancy and probable radiation pneumonitis. Underwent right bronchial artery angiogram and embolization on 7/3( she previously had undergone a similar procedure on 08/10/2014), following which hemoptysis seems to have improved somewhat with less amount and becoming less frequent, but nonetheless unfortunately continue. Seen by Dr. Julien Nordmann and radiation oncology today, plans are to reinitiate palliative radiation.  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 Julien Nordmann and is on Tarceva daily and Xgeva monthly. She recently had a bronchoscopy on 6/21 which showed mass in the right lower lobe, and was thought to have hemoptysis likely originating from this area. Dr. Candiss Norse had spoken with PCCM M.D. on 7/4, and was told that there was no further recommendations  from pulmonology this point. See above- regarding plans to reinitiate palliative radiation.  DVT Prophylaxis: SCD's  Code Status: Full code   Family Communication: None at bedside-patient is awake and alert and is understanding of the above noted plan.  Disposition Plan: Remain inpatient-home in the next few days  Antimicrobial agents: None  Procedures: 7/3>>right bronchial artery angiogram with embolization  CONSULTS:  hematology/oncology and IR  Time spent: 25 minutes-Greater than 50% of this time was spent in counseling, explanation of diagnosis, planning of further management, and coordination of care.  MEDICATIONS: Anti-infectives    None      Scheduled Meds: . clindamycin  1 application Topical BID  . vitamin B-12  2,500 mcg Oral Daily  . erlotinib  100 mg Oral Daily  . ferrous sulfate  325 mg Oral Q breakfast  . folic acid  1 mg Oral Daily  . loratadine  10 mg Oral Daily  . sodium chloride flush  3 mL Intravenous Q12H   Continuous Infusions: . sodium chloride 75 mL/hr at 07/20/15 0637   PRN Meds:.[DISCONTINUED] acetaminophen **OR** acetaminophen, benzonatate, chlorpheniramine-HYDROcodone, mometasone-formoterol   PHYSICAL EXAM: Vital signs: Filed Vitals:   07/19/15 1500 07/19/15 2036 07/20/15 0443 07/20/15 0450  BP: 97/58 94/63  90/62  Pulse: 101 98 86   Temp:  98.4 F (36.9 C) 97.9 F (36.6 C)   TempSrc:  Oral Oral   Resp:  16 14   Height:      Weight:      SpO2:  98% 98%    Filed Weights   07/18/15 0430  Weight: 47.356 kg (104 lb 6.4 oz)   Body mass index is 20.39  kg/(m^2).   Gen Exam: Awake and alert with clear speech. Not in any distress  Neck: Supple, No JVD.   Chest: B/L Clear.   CVS: S1 S2 Regular, no murmurs.  Abdomen: soft, BS +, non tender, non distended.  Extremities: no edema, lower extremities warm to touch. Neurologic: Non Focal.  Skin: No Rash or lesions  Wounds: N/A.    LABORATORY DATA: CBC:  Recent Labs Lab  07/18/15 0210 07/18/15 0453 07/19/15 0509 07/20/15 0434  WBC 7.2 6.9 11.1* 8.3  HGB 13.9 13.1 12.2 11.8*  HCT 41.5 38.6 36.0 34.4*  MCV 92.8 93.0 94.0 93.7  PLT 410* 383 355 956    Basic Metabolic Panel:  Recent Labs Lab 07/18/15 0210 07/18/15 0453 07/19/15 0509  NA 136 137 137  K 3.5 3.8 3.8  CL 103 105 109  CO2 '26 25 23  '$ GLUCOSE 113* 89 88  BUN '9 9 9  '$ CREATININE 0.58 0.45 0.40*  CALCIUM 8.6* 8.3* 7.5*    GFR: Estimated Creatinine Clearance: 65.1 mL/min (by C-G formula based on Cr of 0.4).  Liver Function Tests:  Recent Labs Lab 07/18/15 0453  AST 20  ALT 18  ALKPHOS 46  BILITOT 0.7  PROT 6.5  ALBUMIN 3.7   No results for input(s): LIPASE, AMYLASE in the last 168 hours. No results for input(s): AMMONIA in the last 168 hours.  Coagulation Profile:  Recent Labs Lab 07/18/15 0210  INR 0.99    Cardiac Enzymes: No results for input(s): CKTOTAL, CKMB, CKMBINDEX, TROPONINI in the last 168 hours.  BNP (last 3 results) No results for input(s): PROBNP in the last 8760 hours.  HbA1C: No results for input(s): HGBA1C in the last 72 hours.  CBG:  Recent Labs Lab 07/18/15 0734 07/19/15 0738 07/20/15 0750  GLUCAP 87 90 89    Lipid Profile: No results for input(s): CHOL, HDL, LDLCALC, TRIG, CHOLHDL, LDLDIRECT in the last 72 hours.  Thyroid Function Tests: No results for input(s): TSH, T4TOTAL, FREET4, T3FREE, THYROIDAB in the last 72 hours.  Anemia Panel: No results for input(s): VITAMINB12, FOLATE, FERRITIN, TIBC, IRON, RETICCTPCT in the last 72 hours.  Urine analysis:    Component Value Date/Time   COLORURINE YELLOW 02/04/2015 1630   APPEARANCEUR CLEAR 02/04/2015 1630   LABSPEC 1.007 02/04/2015 1630   LABSPEC 1.025 09/24/2014 1053   PHURINE 7.5 02/04/2015 1630   PHURINE 6.0 09/24/2014 1053   GLUCOSEU NEGATIVE 02/04/2015 1630   GLUCOSEU Negative 09/24/2014 1053   HGBUR TRACE* 02/04/2015 1630   HGBUR Moderate 09/24/2014 1053    BILIRUBINUR NEGATIVE 02/04/2015 1630   BILIRUBINUR Negative 09/24/2014 1053   KETONESUR NEGATIVE 02/04/2015 1630   KETONESUR Negative 09/24/2014 1053   PROTEINUR NEGATIVE 02/04/2015 1630   PROTEINUR 100 09/24/2014 1053   UROBILINOGEN 0.2 09/24/2014 1053   UROBILINOGEN 0.2 08/13/2014 1059   NITRITE NEGATIVE 02/04/2015 1630   NITRITE Negative 09/24/2014 1053   LEUKOCYTESUR TRACE* 02/04/2015 1630   LEUKOCYTESUR Small 09/24/2014 1053    Sepsis Labs: Lactic Acid, Venous No results found for: LATICACIDVEN  MICROBIOLOGY: No results found for this or any previous visit (from the past 240 hour(s)).  RADIOLOGY STUDIES/RESULTS: Dg Chest 2 View  07/18/2015  CLINICAL DATA:  Acute onset of hemoptysis. Personal history of lung cancer. Initial encounter. EXAM: CHEST  2 VIEW COMPARISON:  Chest radiograph performed 06/20/2015, and CTA of the chest performed 06/02/2015 FINDINGS: Right perihilar airspace opacification is grossly unchanged in appearance. The left lung remains relatively clear. No pleural effusion or pneumothorax  is seen. The heart is normal in size. No acute osseous abnormalities identified. IMPRESSION: Right perihilar airspace opacification again noted, stable in appearance, likely reflecting posttreatment change, though if the patient's hemoptysis persists, bronchoscopy could be considered for further evaluation, as suggested on prior CTA. Electronically Signed   By: Garald Balding M.D.   On: 07/18/2015 02:27   Ir Angiogram Pulmonary Right Selective  07/18/2015  INDICATION: 45 year old female with a history of hemoptysis. This is recurrent hemoptysis, with known non-small cell lung carcinoma and previous treatment with radiation therapy. She has been treated in July 2016 for hemoptysis. She has had recurrent bleeding with admission Saturday July 1st, and presents now for repeat angiogram and possible embolization. EXAM: RIGHT PULMONARY ARTERIOGRAPHY; IR ULTRASOUND GUIDANCE VASC ACCESS RIGHT;  ADDITIONAL ARTERIOGRAPHY; IR EMBO ART VEN HEMORR LYMPH EXTRAV INC GUIDE ROADMAPPING MEDICATIONS: No additional ANESTHESIA/SEDATION: Moderate (conscious) sedation was employed during this procedure. A total of Versed 4.0 mg and Fentanyl 100 mcg was administered intravenously. Moderate Sedation Time: 68 minutes. The patient's level of consciousness and vital signs were monitored continuously by radiology nursing throughout the procedure under my direct supervision. CONTRAST:  100 cc FLUOROSCOPY TIME:  Fluoroscopy Time: 23 minutes 24 seconds (989 mGy). COMPLICATIONS: None PROCEDURE: Informed consent was obtained from the patient following explanation of the procedure, risks, benefits and alternatives. The patient understands, agrees and consents for the procedure. All questions were addressed. A time out was performed prior to the initiation of the procedure. Maximal barrier sterile technique utilized including caps, mask, sterile gowns, sterile gloves, large sterile drape, hand hygiene, and Betadine prep. Ultrasound survey of the right inguinal region was performed with images stored and sent to PACs. A micropuncture needle was used access the right common femoral artery under ultrasound. With excellent arterial blood flow returned, and an .018 micro wire was passed through the needle, observed enter the abdominal aorta under fluoroscopy. The needle was removed, and a micropuncture sheath was placed over the wire. The inner dilator and wire were removed, and an 035 Bentson wire was advanced under fluoroscopy into the abdominal aorta. The sheath was removed and a standard 5 [place] vascular sheath was placed. The dilator was removed and the sheath was flushed. Mickelson catheter was advanced over the Bentson wire into the thoracic aorta. Double flush of the catheter was performed in the proximal descending thoracic aorta. Mickelson catheter was formed over the aortic arch, and the right bronchial artery was selected.  Angiogram was performed. Micro catheter arrangement with a high-flow Renegade and 014 Fathom wire was introduced. Micro catheter was advanced beyond the origin of the supreme intercostal artery. Repeat angiogram was performed. Embolization was then performed with 300 - 500 micro meter embosphere. Angiogram was not performed. Micro catheter was withdrawn slightly and a final embolization was performed with 500 -700 micro meter embosphere. Final angiogram performed. The micro catheter system was removed, and additional segmental artery interrogation was performed. The thoracic 6 right segmental artery was interrogated. After initial angiogram, the micro catheter system was reintroduced, and an attempt at selecting hypertrophied early branch to the right lower lobe was performed. During the attempted catheterization, the artery was in spasm/dissection, and a final angiogram at this segmental vessel demonstrated decreased flow. Thoracic 7 right segmental artery was interrogated. Again, abnormal early tortuous branch was identified with small contribution to the medial region of the lower right lung. Thoracic 8 right segmental artery interrogated. Thoracic 9 right segmental artery interrogated. Thoracic 10 right segmental artery interrogated. Angiogram of  the right common femoral artery was performed. Catheters and wires were removed and an Exoseal device was deployed for hemostasis. Patient tolerated the procedure well and remained hemodynamically stable throughout. No complications were encountered and no significant blood loss encountered. FINDINGS: Ultrasound survey of the right inguinal region demonstrates widely pacer right common femoral artery. Initial angiogram of the right bronchial artery at the thoracic 5 level demonstrates significant contribution to the right hilar region and right lower lung via very abnormal vasculature, which have abnormal tortuous anatomy and enlarging diameter as they continue distally  from the bronchial artery. The right supreme intercostal artery arises from the apex of the bronchial artery. Status post embolization, there is nearly no flow to the hilar region and right lower lung via this right bronchial artery. Angiogram of thoracic 7 segmental artery demonstrates and early abnormal tortuous branch contributing to the infrahilar right lower lung. During attempted selection with the micro catheter system, the artery was spasm/dissected, with final angiogram demonstrating decreased flow to this territory with patency of the segmental vessel maintained. Embolization was not performed at this level. Angiogram of thoracic 8 segmental artery demonstrate a similar early tortuous abnormal artery, with minimal contribution to the infrahilar region. Angiogram of thoracic 9 segmental artery demonstrates no significant contribution. Angiogram of thoracic 10 segmental artery demonstrates no significant contribution. IMPRESSION: Status post angiogram of right bronchial artery and 5 additional right-sided segmental arteries, with embosphere embolization of the right bronchial artery for hemostasis. Status post Exoseal deployment. Signed, Dulcy Fanny. Earleen Newport, DO [facility] Radiology Electronically Signed   By: Corrie Mckusick D.O.   On: 07/18/2015 18:04   Ir Angiogram Pulmonary Right Selective  07/18/2015  INDICATION: 45 year old female with a history of hemoptysis. This is recurrent hemoptysis, with known non-small cell lung carcinoma and previous treatment with radiation therapy. She has been treated in July 2016 for hemoptysis. She has had recurrent bleeding with admission Saturday July 1st, and presents now for repeat angiogram and possible embolization. EXAM: RIGHT PULMONARY ARTERIOGRAPHY; IR ULTRASOUND GUIDANCE VASC ACCESS RIGHT; ADDITIONAL ARTERIOGRAPHY; IR EMBO ART VEN HEMORR LYMPH EXTRAV INC GUIDE ROADMAPPING MEDICATIONS: No additional  ANESTHESIA/SEDATION: Moderate (conscious) sedation was employed during this procedure. A total of Versed 4.0 mg and Fentanyl 100 mcg was administered intravenously. Moderate Sedation Time: 68 minutes. The patient's level of consciousness and vital signs were monitored continuously by radiology nursing throughout the procedure under my direct supervision. CONTRAST:  100 cc FLUOROSCOPY TIME:  Fluoroscopy Time: 23 minutes 24 seconds (989 mGy). COMPLICATIONS: None PROCEDURE: Informed consent was obtained from the patient following explanation of the procedure, risks, benefits and alternatives. The patient understands, agrees and consents for the procedure. All questions were addressed. A time out was performed prior to the initiation of the procedure. Maximal barrier sterile technique utilized including caps, mask, sterile gowns, sterile gloves, large sterile drape, hand hygiene, and Betadine prep. Ultrasound survey of the right inguinal region was performed with images stored and sent to PACs. A micropuncture needle was used access the right common femoral artery under ultrasound. With excellent arterial blood flow returned, and an .018 micro wire was passed through the needle, observed enter the abdominal aorta under fluoroscopy. The needle was removed, and a micropuncture sheath was placed over the wire. The inner dilator and wire were removed, and an 035 Bentson wire was advanced under fluoroscopy into the abdominal aorta. The sheath was removed and a standard 5 [place] vascular sheath was placed. The dilator was removed and  the sheath was flushed. Mickelson catheter was advanced over the Bentson wire into the thoracic aorta. Double flush of the catheter was performed in the proximal descending thoracic aorta. Mickelson catheter was formed over the aortic arch, and the right bronchial artery was selected. Angiogram was performed. Micro catheter arrangement with a high-flow Renegade and 014 Fathom wire was introduced.  Micro catheter was advanced beyond the origin of the supreme intercostal artery. Repeat angiogram was performed. Embolization was then performed with 300 - 500 micro meter embosphere. Angiogram was not performed. Micro catheter was withdrawn slightly and a final embolization was performed with 500 -700 micro meter embosphere. Final angiogram performed. The micro catheter system was removed, and additional segmental artery interrogation was performed. The thoracic 6 right segmental artery was interrogated. After initial angiogram, the micro catheter system was reintroduced, and an attempt at selecting hypertrophied early branch to the right lower lobe was performed. During the attempted catheterization, the artery was in spasm/dissection, and a final angiogram at this segmental vessel demonstrated decreased flow. Thoracic 7 right segmental artery was interrogated. Again, abnormal early tortuous branch was identified with small contribution to the medial region of the lower right lung. Thoracic 8 right segmental artery interrogated. Thoracic 9 right segmental artery interrogated. Thoracic 10 right segmental artery interrogated. Angiogram of the right common femoral artery was performed. Catheters and wires were removed and an Exoseal device was deployed for hemostasis. Patient tolerated the procedure well and remained hemodynamically stable throughout. No complications were encountered and no significant blood loss encountered. FINDINGS: Ultrasound survey of the right inguinal region demonstrates widely pacer right common femoral artery. Initial angiogram of the right bronchial artery at the thoracic 5 level demonstrates significant contribution to the right hilar region and right lower lung via very abnormal vasculature, which have abnormal tortuous anatomy and enlarging diameter as they continue distally from the bronchial artery. The right supreme intercostal artery arises from the apex of the bronchial artery.  Status post embolization, there is nearly no flow to the hilar region and right lower lung via this right bronchial artery. Angiogram of thoracic 7 segmental artery demonstrates and early abnormal tortuous branch contributing to the infrahilar right lower lung. During attempted selection with the micro catheter system, the artery was spasm/dissected, with final angiogram demonstrating decreased flow to this territory with patency of the segmental vessel maintained. Embolization was not performed at this level. Angiogram of thoracic 8 segmental artery demonstrate a similar early tortuous abnormal artery, with minimal contribution to the infrahilar region. Angiogram of thoracic 9 segmental artery demonstrates no significant contribution. Angiogram of thoracic 10 segmental artery demonstrates no significant contribution. IMPRESSION: Status post angiogram of right bronchial artery and 5 additional right-sided segmental arteries, with embosphere embolization of the right bronchial artery for hemostasis. Status post Exoseal deployment. Signed, Dulcy Fanny. Earleen Newport, DO [facility] Radiology Electronically Signed   By: Corrie Mckusick D.O.   On: 07/18/2015 18:04   Ir Angiogram Pulmonary Right Selective  07/18/2015  INDICATION: 45 year old female with a history of hemoptysis. This is recurrent hemoptysis, with known non-small cell lung carcinoma and previous treatment with radiation therapy. She has been treated in July 2016 for hemoptysis. She has had recurrent bleeding with admission Saturday July 1st, and presents now for repeat angiogram and possible embolization. EXAM: RIGHT PULMONARY ARTERIOGRAPHY; IR ULTRASOUND GUIDANCE VASC ACCESS RIGHT; ADDITIONAL ARTERIOGRAPHY; IR EMBO ART VEN HEMORR LYMPH EXTRAV INC GUIDE ROADMAPPING MEDICATIONS: No additional ANESTHESIA/SEDATION: Moderate (conscious) sedation was employed during this  procedure. A total of Versed 4.0 mg and Fentanyl 100  mcg was administered intravenously. Moderate Sedation Time: 68 minutes. The patient's level of consciousness and vital signs were monitored continuously by radiology nursing throughout the procedure under my direct supervision. CONTRAST:  100 cc FLUOROSCOPY TIME:  Fluoroscopy Time: 23 minutes 24 seconds (989 mGy). COMPLICATIONS: None PROCEDURE: Informed consent was obtained from the patient following explanation of the procedure, risks, benefits and alternatives. The patient understands, agrees and consents for the procedure. All questions were addressed. A time out was performed prior to the initiation of the procedure. Maximal barrier sterile technique utilized including caps, mask, sterile gowns, sterile gloves, large sterile drape, hand hygiene, and Betadine prep. Ultrasound survey of the right inguinal region was performed with images stored and sent to PACs. A micropuncture needle was used access the right common femoral artery under ultrasound. With excellent arterial blood flow returned, and an .018 micro wire was passed through the needle, observed enter the abdominal aorta under fluoroscopy. The needle was removed, and a micropuncture sheath was placed over the wire. The inner dilator and wire were removed, and an 035 Bentson wire was advanced under fluoroscopy into the abdominal aorta. The sheath was removed and a standard 5 [place] vascular sheath was placed. The dilator was removed and the sheath was flushed. Mickelson catheter was advanced over the Bentson wire into the thoracic aorta. Double flush of the catheter was performed in the proximal descending thoracic aorta. Mickelson catheter was formed over the aortic arch, and the right bronchial artery was selected. Angiogram was performed. Micro catheter arrangement with a high-flow Renegade and 014 Fathom wire was introduced. Micro catheter was advanced beyond the origin of the supreme intercostal artery. Repeat angiogram was performed. Embolization  was then performed with 300 - 500 micro meter embosphere. Angiogram was not performed. Micro catheter was withdrawn slightly and a final embolization was performed with 500 -700 micro meter embosphere. Final angiogram performed. The micro catheter system was removed, and additional segmental artery interrogation was performed. The thoracic 6 right segmental artery was interrogated. After initial angiogram, the micro catheter system was reintroduced, and an attempt at selecting hypertrophied early branch to the right lower lobe was performed. During the attempted catheterization, the artery was in spasm/dissection, and a final angiogram at this segmental vessel demonstrated decreased flow. Thoracic 7 right segmental artery was interrogated. Again, abnormal early tortuous branch was identified with small contribution to the medial region of the lower right lung. Thoracic 8 right segmental artery interrogated. Thoracic 9 right segmental artery interrogated. Thoracic 10 right segmental artery interrogated. Angiogram of the right common femoral artery was performed. Catheters and wires were removed and an Exoseal device was deployed for hemostasis. Patient tolerated the procedure well and remained hemodynamically stable throughout. No complications were encountered and no significant blood loss encountered. FINDINGS: Ultrasound survey of the right inguinal region demonstrates widely pacer right common femoral artery. Initial angiogram of the right bronchial artery at the thoracic 5 level demonstrates significant contribution to the right hilar region and right lower lung via very abnormal vasculature, which have abnormal tortuous anatomy and enlarging diameter as they continue distally from the bronchial artery. The right supreme intercostal artery arises from the apex of the bronchial artery. Status post embolization, there is nearly no flow to the hilar region and right lower lung via this right bronchial artery.  Angiogram of thoracic 7 segmental artery demonstrates and early abnormal tortuous branch contributing to the infrahilar right lower lung. During  attempted selection with the micro catheter system, the artery was spasm/dissected, with final angiogram demonstrating decreased flow to this territory with patency of the segmental vessel maintained. Embolization was not performed at this level. Angiogram of thoracic 8 segmental artery demonstrate a similar early tortuous abnormal artery, with minimal contribution to the infrahilar region. Angiogram of thoracic 9 segmental artery demonstrates no significant contribution. Angiogram of thoracic 10 segmental artery demonstrates no significant contribution. IMPRESSION: Status post angiogram of right bronchial artery and 5 additional right-sided segmental arteries, with embosphere embolization of the right bronchial artery for hemostasis. Status post Exoseal deployment. Signed, Dulcy Fanny. Earleen Newport, DO [facility] Rad[facility] Electronically Signed   By: Corrie Mckusick D.O.   On: 07/18/2015 18:04   Ir Angiogram Pulmonary Right Selective  07/18/2015  INDICATION: 45 year old female with a history of hemoptysis. This is recurrent hemoptysis, with known non-small cell lung carcinoma and previous treatment with radiation therapy. She has been treated in July 2016 for hemoptysis. She has had recurrent bleeding with admission Saturday July 1st, and presents now for repeat angiogram and possible embolization. EXAM: RIGHT PULMONARY ARTERIOGRAPHY; IR ULTRASOUND GUIDANCE VASC ACCESS RIGHT; ADDITIONAL ARTERIOGRAPHY; IR EMBO ART VEN HEMORR LYMPH EXTRAV INC GUIDE ROADMAPPING MEDICATIONS: No additional ANESTHESIA/SEDATION: Moderate (conscious) sedation was employed during this procedure. A total of Versed 4.0 mg and Fentanyl 100 mcg was administered intravenously. Moderate Sedation Time: 68 minutes. The patient's level of consciousness and vital signs  were monitored continuously by radiology nursing throughout the procedure under my direct supervision. CONTRAST:  100 cc FLUOROSCOPY TIME:  Fluoroscopy Time: 23 minutes 24 seconds (989 mGy). COMPLICATIONS: None PROCEDURE: Informed consent was obtained from the patient following explanation of the procedure, risks, benefits and alternatives. The patient understands, agrees and consents for the procedure. All questions were addressed. A time out was performed prior to the initiation of the procedure. Maximal barrier sterile technique utilized including caps, mask, sterile gowns, sterile gloves, large sterile drape, hand hygiene, and Betadine prep. Ultrasound survey of the right inguinal region was performed with images stored and sent to PACs. A micropuncture needle was used access the right common femoral artery under ultrasound. With excellent arterial blood flow returned, and an .018 micro wire was passed through the needle, observed enter the abdominal aorta under fluoroscopy. The needle was removed, and a micropuncture sheath was placed over the wire. The inner dilator and wire were removed, and an 035 Bentson wire was advanced under fluoroscopy into the abdominal aorta. The sheath was removed and a standard 5 [place] vascular sheath was placed. The dilator was removed and the sheath was flushed. Mickelson catheter was advanced over the Bentson wire into the thoracic aorta. Double flush of the catheter was performed in the proximal descending thoracic aorta. Mickelson catheter was formed over the aortic arch, and the right bronchial artery was selected. Angiogram was performed. Micro catheter arrangement with a high-flow Renegade and 014 Fathom wire was introduced. Micro catheter was advanced beyond the origin of the supreme intercostal artery. Repeat angiogram was performed. Embolization was then performed with 300 - 500 micro meter embosphere. Angiogram was not performed. Micro catheter was withdrawn slightly  and a final embolization was performed with 500 -700 micro meter embosphere. Final angiogram performed. The micro catheter system was removed, and additional segmental artery interrogation was performed. The thoracic 6 right segmental artery was interrogated. After initial angiogram, the micro catheter system was reintroduced, and an attempt at selecting hypertrophied early branch to the right lower  lobe was performed. During the attempted catheterization, the artery was in spasm/dissection, and a final angiogram at this segmental vessel demonstrated decreased flow. Thoracic 7 right segmental artery was interrogated. Again, abnormal early tortuous branch was identified with small contribution to the medial region of the lower right lung. Thoracic 8 right segmental artery interrogated. Thoracic 9 right segmental artery interrogated. Thoracic 10 right segmental artery interrogated. Angiogram of the right common femoral artery was performed. Catheters and wires were removed and an Exoseal device was deployed for hemostasis. Patient tolerated the procedure well and remained hemodynamically stable throughout. No complications were encountered and no significant blood loss encountered. FINDINGS: Ultrasound survey of the right inguinal region demonstrates widely pacer right common femoral artery. Initial angiogram of the right bronchial artery at the thoracic 5 level demonstrates significant contribution to the right hilar region and right lower lung via very abnormal vasculature, which have abnormal tortuous anatomy and enlarging diameter as they continue distally from the bronchial artery. The right supreme intercostal artery arises from the apex of the bronchial artery. Status post embolization, there is nearly no flow to the hilar region and right lower lung via this right bronchial artery. Angiogram of thoracic 7 segmental artery demonstrates and early abnormal tortuous branch contributing to the infrahilar right  lower lung. During attempted selection with the micro catheter system, the artery was spasm/dissected, with final angiogram demonstrating decreased flow to this territory with patency of the segmental vessel maintained. Embolization was not performed at this level. Angiogram of thoracic 8 segmental artery demonstrate a similar early tortuous abnormal artery, with minimal contribution to the infrahilar region. Angiogram of thoracic 9 segmental artery demonstrates no significant contribution. Angiogram of thoracic 10 segmental artery demonstrates no significant contribution. IMPRESSION: Status post angiogram of right bronchial artery and 5 additional right-sided segmental arteries, with embosphere embolization of the right bronchial artery for hemostasis. Status post Exoseal deployment. Signed, Dulcy Fanny. Earleen Newport, DO [facility] Radiology Electronically Signed   By: Corrie Mckusick D.O.   On: 07/18/2015 18:04   Ir Angiogram Pulmonary Right Selective  07/18/2015  INDICATION: 45 year old female with a history of hemoptysis. This is recurrent hemoptysis, with known non-small cell lung carcinoma and previous treatment with radiation therapy. She has been treated in July 2016 for hemoptysis. She has had recurrent bleeding with admission Saturday July 1st, and presents now for repeat angiogram and possible embolization. EXAM: RIGHT PULMONARY ARTERIOGRAPHY; IR ULTRASOUND GUIDANCE VASC ACCESS RIGHT; ADDITIONAL ARTERIOGRAPHY; IR EMBO ART VEN HEMORR LYMPH EXTRAV INC GUIDE ROADMAPPING MEDICATIONS: No additional ANESTHESIA/SEDATION: Moderate (conscious) sedation was employed during this procedure. A total of Versed 4.0 mg and Fentanyl 100 mcg was administered intravenously. Moderate Sedation Time: 68 minutes. The patient's level of consciousness and vital signs were monitored continuously by radiology nursing throughout the procedure under my direct supervision. CONTRAST:  100 cc  FLUOROSCOPY TIME:  Fluoroscopy Time: 23 minutes 24 seconds (989 mGy). COMPLICATIONS: None PROCEDURE: Informed consent was obtained from the patient following explanation of the procedure, risks, benefits and alternatives. The patient understands, agrees and consents for the procedure. All questions were addressed. A time out was performed prior to the initiation of the procedure. Maximal barrier sterile technique utilized including caps, mask, sterile gowns, sterile gloves, large sterile drape, hand hygiene, and Betadine prep. Ultrasound survey of the right inguinal region was performed with images stored and sent to PACs. A micropuncture needle was used access the right common femoral artery under ultrasound. With excellent arterial blood flow  returned, and an .018 micro wire was passed through the needle, observed enter the abdominal aorta under fluoroscopy. The needle was removed, and a micropuncture sheath was placed over the wire. The inner dilator and wire were removed, and an 035 Bentson wire was advanced under fluoroscopy into the abdominal aorta. The sheath was removed and a standard 5 [place] vascular sheath was placed. The dilator was removed and the sheath was flushed. Mickelson catheter was advanced over the Bentson wire into the thoracic aorta. Double flush of the catheter was performed in the proximal descending thoracic aorta. Mickelson catheter was formed over the aortic arch, and the right bronchial artery was selected. Angiogram was performed. Micro catheter arrangement with a high-flow Renegade and 014 Fathom wire was introduced. Micro catheter was advanced beyond the origin of the supreme intercostal artery. Repeat angiogram was performed. Embolization was then performed with 300 - 500 micro meter embosphere. Angiogram was not performed. Micro catheter was withdrawn slightly and a final embolization was performed with 500 -700 micro meter embosphere. Final angiogram performed. The micro catheter  system was removed, and additional segmental artery interrogation was performed. The thoracic 6 right segmental artery was interrogated. After initial angiogram, the micro catheter system was reintroduced, and an attempt at selecting hypertrophied early branch to the right lower lobe was performed. During the attempted catheterization, the artery was in spasm/dissection, and a final angiogram at this segmental vessel demonstrated decreased flow. Thoracic 7 right segmental artery was interrogated. Again, abnormal early tortuous branch was identified with small contribution to the medial region of the lower right lung. Thoracic 8 right segmental artery interrogated. Thoracic 9 right segmental artery interrogated. Thoracic 10 right segmental artery interrogated. Angiogram of the right common femoral artery was performed. Catheters and wires were removed and an Exoseal device was deployed for hemostasis. Patient tolerated the procedure well and remained hemodynamically stable throughout. No complications were encountered and no significant blood loss encountered. FINDINGS: Ultrasound survey of the right inguinal region demonstrates widely pacer right common femoral artery. Initial angiogram of the right bronchial artery at the thoracic 5 level demonstrates significant contribution to the right hilar region and right lower lung via very abnormal vasculature, which have abnormal tortuous anatomy and enlarging diameter as they continue distally from the bronchial artery. The right supreme intercostal artery arises from the apex of the bronchial artery. Status post embolization, there is nearly no flow to the hilar region and right lower lung via this right bronchial artery. Angiogram of thoracic 7 segmental artery demonstrates and early abnormal tortuous branch contributing to the infrahilar right lower lung. During attempted selection with the micro catheter system, the artery was spasm/dissected, with final angiogram  demonstrating decreased flow to this territory with patency of the segmental vessel maintained. Embolization was not performed at this level. Angiogram of thoracic 8 segmental artery demonstrate a similar early tortuous abnormal artery, with minimal contribution to the infrahilar region. Angiogram of thoracic 9 segmental artery demonstrates no significant contribution. Angiogram of thoracic 10 segmental artery demonstrates no significant contribution. IMPRESSION: Status post angiogram of right bronchial artery and 5 additional right-sided segmental arteries, with embosphere embolization of the right bronchial artery for hemostasis. Status post Exoseal deployment. Signed, Dulcy Fanny. Earleen Newport, DO [facility] Radiology Electronically Signed   By: Corrie Mckusick D.O.   On: 07/18/2015 18:04   Ir Angiogram Pulmonary Right Selective  07/18/2015  INDICATION: 45 year old female with a history of hemoptysis. This is recurrent hemoptysis, with known non-small cell  lung carcinoma and previous treatment with radiation therapy. She has been treated in July 2016 for hemoptysis. She has had recurrent bleeding with admission Saturday July 1st, and presents now for repeat angiogram and possible embolization. EXAM: RIGHT PULMONARY ARTERIOGRAPHY; IR ULTRASOUND GUIDANCE VASC ACCESS RIGHT; ADDITIONAL ARTERIOGRAPHY; IR EMBO ART VEN HEMORR LYMPH EXTRAV INC GUIDE ROADMAPPING MEDICATIONS: No additional ANESTHESIA/SEDATION: Moderate (conscious) sedation was employed during this procedure. A total of Versed 4.0 mg and Fentanyl 100 mcg was administered intravenously. Moderate Sedation Time: 68 minutes. The patient's level of consciousness and vital signs were monitored continuously by radiology nursing throughout the procedure under my direct supervision. CONTRAST:  100 cc FLUOROSCOPY TIME:  Fluoroscopy Time: 23 minutes 24 seconds (989 mGy). COMPLICATIONS: None PROCEDURE: Informed consent was  obtained from the patient following explanation of the procedure, risks, benefits and alternatives. The patient understands, agrees and consents for the procedure. All questions were addressed. A time out was performed prior to the initiation of the procedure. Maximal barrier sterile technique utilized including caps, mask, sterile gowns, sterile gloves, large sterile drape, hand hygiene, and Betadine prep. Ultrasound survey of the right inguinal region was performed with images stored and sent to PACs. A micropuncture needle was used access the right common femoral artery under ultrasound. With excellent arterial blood flow returned, and an .018 micro wire was passed through the needle, observed enter the abdominal aorta under fluoroscopy. The needle was removed, and a micropuncture sheath was placed over the wire. The inner dilator and wire were removed, and an 035 Bentson wire was advanced under fluoroscopy into the abdominal aorta. The sheath was removed and a standard 5 [place] vascular sheath was placed. The dilator was removed and the sheath was flushed. Mickelson catheter was advanced over the Bentson wire into the thoracic aorta. Double flush of the catheter was performed in the proximal descending thoracic aorta. Mickelson catheter was formed over the aortic arch, and the right bronchial artery was selected. Angiogram was performed. Micro catheter arrangement with a high-flow Renegade and 014 Fathom wire was introduced. Micro catheter was advanced beyond the origin of the supreme intercostal artery. Repeat angiogram was performed. Embolization was then performed with 300 - 500 micro meter embosphere. Angiogram was not performed. Micro catheter was withdrawn slightly and a final embolization was performed with 500 -700 micro meter embosphere. Final angiogram performed. The micro catheter system was removed, and additional segmental artery interrogation was performed. The thoracic 6 right segmental artery was  interrogated. After initial angiogram, the micro catheter system was reintroduced, and an attempt at selecting hypertrophied early branch to the right lower lobe was performed. During the attempted catheterization, the artery was in spasm/dissection, and a final angiogram at this segmental vessel demonstrated decreased flow. Thoracic 7 right segmental artery was interrogated. Again, abnormal early tortuous branch was identified with small contribution to the medial region of the lower right lung. Thoracic 8 right segmental artery interrogated. Thoracic 9 right segmental artery interrogated. Thoracic 10 right segmental artery interrogated. Angiogram of the right common femoral artery was performed. Catheters and wires were removed and an Exoseal device was deployed for hemostasis. Patient tolerated the procedure well and remained hemodynamically stable throughout. No complications were encountered and no significant blood loss encountered. FINDINGS: Ultrasound survey of the right inguinal region demonstrates widely pacer right common femoral artery. Initial angiogram of the right bronchial artery at the thoracic 5 level demonstrates significant contribution to the right hilar region and right lower lung via very abnormal vasculature, which have  abnormal tortuous anatomy and enlarging diameter as they continue distally from the bronchial artery. The right supreme intercostal artery arises from the apex of the bronchial artery. Status post embolization, there is nearly no flow to the hilar region and right lower lung via this right bronchial artery. Angiogram of thoracic 7 segmental artery demonstrates and early abnormal tortuous branch contributing to the infrahilar right lower lung. During attempted selection with the micro catheter system, the artery was spasm/dissected, with final angiogram demonstrating decreased flow to this territory with patency of the segmental vessel maintained. Embolization was not performed  at this level. Angiogram of thoracic 8 segmental artery demonstrate a similar early tortuous abnormal artery, with minimal contribution to the infrahilar region. Angiogram of thoracic 9 segmental artery demonstrates no significant contribution. Angiogram of thoracic 10 segmental artery demonstrates no significant contribution. IMPRESSION: Status post angiogram of right bronchial artery and 5 additional right-sided segmental arteries, with embosphere embolization of the right bronchial artery for hemostasis. Status post Exoseal deployment. Signed, Dulcy Fanny. Earleen Newport, DO [facility] Radiology Electronically Signed   By: Corrie Mckusick D.O.   On: 07/18/2015 18:04   Ir Angiogram Selective Each Additional Vessel  07/18/2015  INDICATION: 45 year old female with a history of hemoptysis. This is recurrent hemoptysis, with known non-small cell lung carcinoma and previous treatment with radiation therapy. She has been treated in July 2016 for hemoptysis. She has had recurrent bleeding with admission Saturday July 1st, and presents now for repeat angiogram and possible embolization. EXAM: RIGHT PULMONARY ARTERIOGRAPHY; IR ULTRASOUND GUIDANCE VASC ACCESS RIGHT; ADDITIONAL ARTERIOGRAPHY; IR EMBO ART VEN HEMORR LYMPH EXTRAV INC GUIDE ROADMAPPING MEDICATIONS: No additional ANESTHESIA/SEDATION: Moderate (conscious) sedation was employed during this procedure. A total of Versed 4.0 mg and Fentanyl 100 mcg was administered intravenously. Moderate Sedation Time: 68 minutes. The patient's level of consciousness and vital signs were monitored continuously by radiology nursing throughout the procedure under my direct supervision. CONTRAST:  100 cc FLUOROSCOPY TIME:  Fluoroscopy Time: 23 minutes 24 seconds (989 mGy). COMPLICATIONS: None PROCEDURE: Informed consent was obtained from the patient following explanation of the procedure, risks, benefits and alternatives. The patient understands,  agrees and consents for the procedure. All questions were addressed. A time out was performed prior to the initiation of the procedure. Maximal barrier sterile technique utilized including caps, mask, sterile gowns, sterile gloves, large sterile drape, hand hygiene, and Betadine prep. Ultrasound survey of the right inguinal region was performed with images stored and sent to PACs. A micropuncture needle was used access the right common femoral artery under ultrasound. With excellent arterial blood flow returned, and an .018 micro wire was passed through the needle, observed enter the abdominal aorta under fluoroscopy. The needle was removed, and a micropuncture sheath was placed over the wire. The inner dilator and wire were removed, and an 035 Bentson wire was advanced under fluoroscopy into the abdominal aorta. The sheath was removed and a standard 5 [place] vascular sheath was placed. The dilator was removed and the sheath was flushed. Mickelson catheter was advanced over the Bentson wire into the thoracic aorta. Double flush of the catheter was performed in the proximal descending thoracic aorta. Mickelson catheter was formed over the aortic arch, and the right bronchial artery was selected. Angiogram was performed. Micro catheter arrangement with a high-flow Renegade and 014 Fathom wire was introduced. Micro catheter was advanced beyond the origin of the supreme intercostal artery. Repeat angiogram was performed. Embolization was then performed with 300 - 500  micro meter embosphere. Angiogram was not performed. Micro catheter was withdrawn slightly and a final embolization was performed with 500 -700 micro meter embosphere. Final angiogram performed. The micro catheter system was removed, and additional segmental artery interrogation was performed. The thoracic 6 right segmental artery was interrogated. After initial angiogram, the micro catheter system was reintroduced, and an attempt at selecting hypertrophied  early branch to the right lower lobe was performed. During the attempted catheterization, the artery was in spasm/dissection, and a final angiogram at this segmental vessel demonstrated decreased flow. Thoracic 7 right segmental artery was interrogated. Again, abnormal early tortuous branch was identified with small contribution to the medial region of the lower right lung. Thoracic 8 right segmental artery interrogated. Thoracic 9 right segmental artery interrogated. Thoracic 10 right segmental artery interrogated. Angiogram of the right common femoral artery was performed. Catheters and wires were removed and an Exoseal device was deployed for hemostasis. Patient tolerated the procedure well and remained hemodynamically stable throughout. No complications were encountered and no significant blood loss encountered. FINDINGS: Ultrasound survey of the right inguinal region demonstrates widely pacer right common femoral artery. Initial angiogram of the right bronchial artery at the thoracic 5 level demonstrates significant contribution to the right hilar region and right lower lung via very abnormal vasculature, which have abnormal tortuous anatomy and enlarging diameter as they continue distally from the bronchial artery. The right supreme intercostal artery arises from the apex of the bronchial artery. Status post embolization, there is nearly no flow to the hilar region and right lower lung via this right bronchial artery. Angiogram of thoracic 7 segmental artery demonstrates and early abnormal tortuous branch contributing to the infrahilar right lower lung. During attempted selection with the micro catheter system, the artery was spasm/dissected, with final angiogram demonstrating decreased flow to this territory with patency of the segmental vessel maintained. Embolization was not performed at this level. Angiogram of thoracic 8 segmental artery demonstrate a similar early tortuous abnormal artery, with minimal  contribution to the infrahilar region. Angiogram of thoracic 9 segmental artery demonstrates no significant contribution. Angiogram of thoracic 10 segmental artery demonstrates no significant contribution. IMPRESSION: Status post angiogram of right bronchial artery and 5 additional right-sided segmental arteries, with embosphere embolization of the right bronchial artery for hemostasis. Status post Exoseal deployment. Signed, Dulcy Fanny. Earleen Newport, DO [facility] Radiology Electronically Signed   By: Corrie Mckusick D.O.   On: 07/18/2015 18:04   Dg Esophagus  07/06/2015  CLINICAL DATA:  Lung cancer undergoing radiation therapy. Odynophagia EXAM: ESOPHOGRAM / BARIUM SWALLOW / BARIUM TABLET STUDY TECHNIQUE: Combined double contrast and single contrast examination performed using effervescent crystals, thick barium liquid, and thin barium liquid. The patient was observed with fluoroscopy swallowing a 13 mm barium sulphate tablet. FLUOROSCOPY TIME:  Radiation Exposure Index (as provided by the fluoroscopic device): 9.4 mGy If the device does not provide the exposure index: Fluoroscopy Time:  dictate in minutes and seconds Number of Acquired Images: COMPARISON:  CT chest 06/02/2015 FINDINGS: Normal swallowing function in the high cervical esophagus. No mucosal irregularity within the thoracic esophagus or distal esophagus. No stricture mass within the esophagus. GE junction is normal With patient in prone position, there is poor initiation of the primary stripping wave. Contrast remained in the proximal esophagus with to and fro motion and to the high cervical esophagus and hypopharynx. No tertiary contractions. 13 mm barium tablet passed the GE junction easily. IMPRESSION: 1. Esophageal dysmotility with poor initiation of  the primary stripping wave and to and fro motion of barium column in the high cervical esophagus. No tertiary contractions. 2. No mucosal irregularity  stricture mass in esophagus. 3. Barium tablet passed GE junction easily. Electronically Signed   By: Suzy Bouchard M.D.   On: 07/06/2015 10:40   Ir US Guide Vasc Access Right  07/18/2015  INDICATION: 45 year old female with a history of hemoptysis. This is recurrent hemoptysis, with known non-small cell lung carcinoma and previous treatment with radiation therapy. She has been treated in July 2016 for hemoptysis. She has had recurrent bleeding with admission Saturday July 1st, and presents now for repeat angiogram and possible embolization. EXAM: RIGHT PULMONARY ARTERIOGRAPHY; IR ULTRASOUND GUIDANCE VASC ACCESS RIGHT; ADDITIONAL ARTERIOGRAPHY; IR EMBO ART VEN HEMORR LYMPH EXTRAV INC GUIDE ROADMAPPING MEDICATIONS: No additional ANESTHESIA/SEDATION: Moderate (conscious) sedation was employed during this procedure. A total of Versed 4.0 mg and Fentanyl 100 mcg was administered intravenously. Moderate Sedation Time: 68 minutes. The patient's level of consciousness and vital signs were monitored continuously by radiology nursing throughout the procedure under my direct supervision. CONTRAST:  100 cc FLUOROSCOPY TIME:  Fluoroscopy Time: 23 minutes 24 seconds (989 mGy). COMPLICATIONS: None PROCEDURE: Informed consent was obtained from the patient following explanation of the procedure, risks, benefits and alternatives. The patient understands, agrees and consents for the procedure. All questions were addressed. A time out was performed prior to the initiation of the procedure. Maximal barrier sterile technique utilized including caps, mask, sterile gowns, sterile gloves, large sterile drape, hand hygiene, and Betadine prep. Ultrasound survey of the right inguinal region was performed with images stored and sent to PACs. A micropuncture needle was used access the right common femoral artery under ultrasound. With excellent arterial blood flow returned, and an .018 micro wire was passed through the needle, observed  enter the abdominal aorta under fluoroscopy. The needle was removed, and a micropuncture sheath was placed over the wire. The inner dilator and wire were removed, and an 035 Bentson wire was advanced under fluoroscopy into the abdominal aorta. The sheath was removed and a standard 5 [place] vascular sheath was placed. The dilator was removed and the sheath was flushed. Mickelson catheter was advanced over the Bentson wire into the thoracic aorta. Double flush of the catheter was performed in the proximal descending thoracic aorta. Mickelson catheter was formed over the aortic arch, and the right bronchial artery was selected. Angiogram was performed. Micro catheter arrangement with a high-flow Renegade and 014 Fathom wire was introduced. Micro catheter was advanced beyond the origin of the supreme intercostal artery. Repeat angiogram was performed. Embolization was then performed with 300 - 500 micro meter embosphere. Angiogram was not performed. Micro catheter was withdrawn slightly and a final embolization was performed with 500 -700 micro meter embosphere. Final angiogram performed. The micro catheter system was removed, and additional segmental artery interrogation was performed. The thoracic 6 right segmental artery was interrogated. After initial angiogram, the micro catheter system was reintroduced, and an attempt at selecting hypertrophied early branch to the right lower lobe was performed. During the attempted catheterization, the artery was in spasm/dissection, and a final angiogram at this segmental vessel demonstrated decreased flow. Thoracic 7 right segmental artery was interrogated. Again, abnormal early tortuous branch was identified with small contribution to the medial region of the lower right lung. Thoracic 8 right segmental artery interrogated. Thoracic 9 right segmental artery interrogated. Thoracic 10 right segmental artery interrogated. Angiogram of the right common femoral artery was  performed. Catheters  and wires were removed and an Exoseal device was deployed for hemostasis. Patient tolerated the procedure well and remained hemodynamically stable throughout. No complications were encountered and no significant blood loss encountered. FINDINGS: Ultrasound survey of the right inguinal region demonstrates widely pacer right common femoral artery. Initial angiogram of the right bronchial artery at the thoracic 5 level demonstrates significant contribution to the right hilar region and right lower lung via very abnormal vasculature, which have abnormal tortuous anatomy and enlarging diameter as they continue distally from the bronchial artery. The right supreme intercostal artery arises from the apex of the bronchial artery. Status post embolization, there is nearly no flow to the hilar region and right lower lung via this right bronchial artery. Angiogram of thoracic 7 segmental artery demonstrates and early abnormal tortuous branch contributing to the infrahilar right lower lung. During attempted selection with the micro catheter system, the artery was spasm/dissected, with final angiogram demonstrating decreased flow to this territory with patency of the segmental vessel maintained. Embolization was not performed at this level. Angiogram of thoracic 8 segmental artery demonstrate a similar early tortuous abnormal artery, with minimal contribution to the infrahilar region. Angiogram of thoracic 9 segmental artery demonstrates no significant contribution. Angiogram of thoracic 10 segmental artery demonstrates no significant contribution. IMPRESSION: Status post angiogram of right bronchial artery and 5 additional right-sided segmental arteries, with embosphere embolization of the right bronchial artery for hemostasis. Status post Exoseal deployment. Signed, Dulcy Fanny. Earleen Newport, DO [facility]iology Electronically Signed   By: Corrie Mckusick D.O.    On: 07/18/2015 18:04   [place] Guide Roadmapping  07/18/2015  INDICATION: 45 year old female with a history of hemoptysis. This is recurrent hemoptysis, with known non-small cell lung carcinoma and previous treatment with radiation therapy. She has been treated in July 2016 for hemoptysis. She has had recurrent bleeding with admission Saturday July 1st, and presents now for repeat angiogram and possible embolization. EXAM: RIGHT PULMONARY ARTERIOGRAPHY; IR ULTRASOUND GUIDANCE VASC ACCESS RIGHT; ADDITIONAL ARTERIOGRAPHY; IR EMBO ART VEN HEMORR LYMPH EXTRAV INC GUIDE ROADMAPPING MEDICATIONS: No additional ANESTHESIA/SEDATION: Moderate (conscious) sedation was employed during this procedure. A total of Versed 4.0 mg and Fentanyl 100 mcg was administered intravenously. Moderate Sedation Time: 68 minutes. The patient's level of consciousness and vital signs were monitored continuously by radiology nursing throughout the procedure under my direct supervision. CONTRAST:  100 cc FLUOROSCOPY TIME:  Fluoroscopy Time: 23 minutes 24 seconds (989 mGy). COMPLICATIONS: None PROCEDURE: Informed consent was obtained from the patient following explanation of the procedure, risks, benefits and alternatives. The patient understands, agrees and consents for the procedure. All questions were addressed. A time out was performed prior to the initiation of the procedure. Maximal barrier sterile technique utilized including caps, mask, sterile gowns, sterile gloves, large sterile drape, hand hygiene, and Betadine prep. Ultrasound survey of the right inguinal region was performed with images stored and sent to PACs. A micropuncture needle was used access the right common femoral artery under ultrasound. With excellent arterial blood flow returned, and an .018 micro wire was passed through the needle, observed enter the abdominal aorta under fluoroscopy. The needle was removed, and a micropuncture sheath was  placed over the wire. The inner dilator and wire were removed, and an 035 Bentson wire was advanced under fluoroscopy into the abdominal aorta. The sheath was removed and a standard 5 [place] vascular sheath was placed. The dilator was removed and the  sheath was flushed. Mickelson catheter was advanced over the Bentson wire into the thoracic aorta. Double flush of the catheter was performed in the proximal descending thoracic aorta. Mickelson catheter was formed over the aortic arch, and the right bronchial artery was selected. Angiogram was performed. Micro catheter arrangement with a high-flow Renegade and 014 Fathom wire was introduced. Micro catheter was advanced beyond the origin of the supreme intercostal artery. Repeat angiogram was performed. Embolization was then performed with 300 - 500 micro meter embosphere. Angiogram was not performed. Micro catheter was withdrawn slightly and a final embolization was performed with 500 -700 micro meter embosphere. Final angiogram performed. The micro catheter system was removed, and additional segmental artery interrogation was performed. The thoracic 6 right segmental artery was interrogated. After initial angiogram, the micro catheter system was reintroduced, and an attempt at selecting hypertrophied early branch to the right lower lobe was performed. During the attempted catheterization, the artery was in spasm/dissection, and a final angiogram at this segmental vessel demonstrated decreased flow. Thoracic 7 right segmental artery was interrogated. Again, abnormal early tortuous branch was identified with small contribution to the medial region of the lower right lung. Thoracic 8 right segmental artery interrogated. Thoracic 9 right segmental artery interrogated. Thoracic 10 right segmental artery interrogated. Angiogram of the right common femoral artery was performed. Catheters and wires were removed and an Exoseal device was deployed for hemostasis. Patient  tolerated the procedure well and remained hemodynamically stable throughout. No complications were encountered and no significant blood loss encountered. FINDINGS: Ultrasound survey of the right inguinal region demonstrates widely pacer right common femoral artery. Initial angiogram of the right bronchial artery at the thoracic 5 level demonstrates significant contribution to the right hilar region and right lower lung via very abnormal vasculature, which have abnormal tortuous anatomy and enlarging diameter as they continue distally from the bronchial artery. The right supreme intercostal artery arises from the apex of the bronchial artery. Status post embolization, there is nearly no flow to the hilar region and right lower lung via this right bronchial artery. Angiogram of thoracic 7 segmental artery demonstrates and early abnormal tortuous branch contributing to the infrahilar right lower lung. During attempted selection with the micro catheter system, the artery was spasm/dissected, with final angiogram demonstrating decreased flow to this territory with patency of the segmental vessel maintained. Embolization was not performed at this level. Angiogram of thoracic 8 segmental artery demonstrate a similar early tortuous abnormal artery, with minimal contribution to the infrahilar region. Angiogram of thoracic 9 segmental artery demonstrates no significant contribution. Angiogram of thoracic 10 segmental artery demonstrates no significant contribution. IMPRESSION: Status post angiogram of right bronchial artery and 5 additional right-sided segmental arteries, with embosphere embolization of the right bronchial artery for hemostasis. Status post Exoseal deployment. Signed, Dulcy Fanny. Earleen Newport, DO [facility] Radiology Electronically Signed   By: Corrie Mckusick D.O.   On: 07/18/2015 18:04     LOS: 2 days   Oren Binet, MD  [facility] Pager:[phone]  [phone]  If 7PM-7AM, please contact night-coverage [email or web address] Password [email or web address] 07/20/2015, 11:34 AM

## 2015-07-20 NOTE — Consult Note (Signed)
Radiation Oncology         [phone] ________________________________  Name: Deborah Foster MRN: [id number]  Date: 07/18/2015  DOB: 1970/02/23  AL:PFXTKW,IOXBDZ OLIVER, MD  No ref. provider found     REFERRING PHYSICIAN: No ref. provider found   DIAGNOSIS: The encounter diagnosis was Hemoptysis.   HISTORY OF PRESENT ILLNESS: Deborah Foster is a 45 y.o. female with a history of Stage IV (T2a, N1, M1b) non-small cell lung cancer, adenocarcinoma of the right lung. She was diagnosed last summer and completed palliative radiotherapy to the brain, right chest, and left hip each with 30 Gy over 12 fractions. She has been on Tarceva since September 2016 with a dose reduction in February 2017. She's also been on Xgeva monthly. She did undergo bronchoscopy on 07/06/2015 which demonstrated right lower lobe lesion without active bleeding, and has been offered bronchial artery angiogram with embolization. She was interested in moving forward with this when she presented to the emergency department with a large amount of bright red blood coughing on 07/18/2015, and presented to the emergency department.   She has undergone angiography to the right pulmonary vessels, with embolization on 07/18/2015. We are asked to see the patient to consider additional radiotherapy to help control hemoptysis as well.  PREVIOUS RADIATION THERAPY: Yes   08/23/14-09/07/14: 30 Gy in 12 fractions was prescribed to the right lung, whole brain, and left hip.   PAST MEDICAL HISTORY:  Past Medical History  Diagnosis Date  . Pneumonia   . Hemoptysis   . Lung mass   . Hypokalemia   . Radiation 08/23/14-09/07/14    Brain/chest and left hip 30 Gy 12 Fx  . lung ca dx'd 07/2014  . Metastasis to brain ([place])   . Metastasis to adrenal gland ([place])   . Bone metastasis ([place])   . URI (upper respiratory infection) 02-02-2015       PAST SURGICAL HISTORY: Past Surgical History  Procedure Laterality Date  . Video bronchoscopy Bilateral  08/09/2014    Procedure: VIDEO BRONCHOSCOPY WITHOUT FLUORO;  Surgeon: [name], MD;  Location: [facility];  Service: Cardiopulmonary;  Laterality: Bilateral;  . Video bronchoscopy Bilateral 07/06/2015    Procedure: VIDEO BRONCHOSCOPY WITHOUT FLUORO;  Surgeon: [name], MD;  Location: [facility];  Service: Cardiopulmonary;  Laterality: Bilateral;     FAMILY HISTORY:  Family History  Problem Relation Age of Onset  . Hyperlipidemia Mother      SOCIAL HISTORY:  reports that she has never smoked. She has never used smokeless tobacco. She reports that she does not drink alcohol or use illicit drugs. The patient is married and resides in [place]. She has three children age 66, 3, and 10.  ALLERGIES: Other and Doxycycline   MEDICATIONS:  Current Facility-Administered Medications  Medication Dose Route Frequency Provider Last Rate Last Dose  . 0.9 %  sodium chloride infusion   Intravenous Continuous Ivor Costa, MD 75 mL/hr at 07/20/15 [phone]    . acetaminophen (TYLENOL) suppository 650 mg  650 mg Rectal Q6H PRN Ivor Costa, MD      . benzonatate (TESSALON) capsule 100 mg  100 mg Oral TID PRN Thurnell Lose, MD   100 mg at 07/20/15 0444  . chlorpheniramine-HYDROcodone (TUSSIONEX) 10-8 MG/5ML suspension 5 mL  5 mL Oral Q12H PRN Ivor Costa, MD   5 mL at 07/19/15 2048  . clindamycin (CLEOCIN T) 1 % external solution 1 application  1 application Topical BID Ivor Costa, MD   1  application at 40/34/74 2358  . cyanocobalamin tablet 2,500 mcg  2,500 mcg Oral Daily [name], MD   2,500 mcg at 07/19/15 0931  . erlotinib (TARCEVA) tablet 100 mg  100 mg Oral Daily Ivor Costa, MD   100 mg at 07/19/15 0933  . ferrous sulfate tablet 325 mg  325 mg Oral Q breakfast Ivor Costa, MD   325 mg at 07/20/15 0826  . folic acid (FOLVITE) tablet 1 mg  1 mg Oral Daily Ivor Costa, MD   1 mg at 07/19/15 0931  . loratadine (CLARITIN) tablet 10 mg  10 mg Oral Daily Ivor Costa, MD   10 mg at 07/19/15 0931  .  mometasone-formoterol (DULERA) 200-5 MCG/ACT inhaler 2 puff  2 puff Inhalation BID PRN Ivor Costa, MD      . sodium chloride flush (NS) 0.9 % injection 3 mL  3 mL Intravenous Q12H Ivor Costa, MD   3 mL at 07/18/15 2595     REVIEW OF SYSTEMS: On review of systems, the patient reports that she is doing ok overall. She has had one episode of hemoptysis since her embolization. She denies any chest pain, shortness of breath, fevers, chills, night sweats, unintended weight changes. She denies any bowel or bladder disturbances, and denies abdominal pain, nausea or vomiting. She denies any new musculoskeletal or joint aches or pains. A complete review of systems is obtained and is otherwise negative.     PHYSICAL EXAM:  height is 5' (1.524 m) and weight is 104 lb 6.4 oz (47.356 kg). Her oral temperature is 97.9 F (36.6 C). Her blood pressure is 90/62 and her pulse is 86. Her respiration is 14 and oxygen saturation is 98%.   Pain scale 0/10 In general this is a chronically ill-appearing Asian female  in no acute distress. She is alert and oriented x4 and appropriate throughout the examination. HEENT reveals that the patient is normocephalic, atraumatic. EOMs are intact. PERRLA. Skin is intact without any evidence of gross lesions. Cardiovascular exam reveals a regular rate and rhythm, no clicks rubs or murmurs are auscultated. Chest is clear to auscultation bilaterally. Lymphatic assessment is performed and does not reveal any adenopathy in the cervical, supraclavicular, axillary, or inguinal chains. Abdomen has active bowel sounds in all quadrants and is intact. The abdomen is soft, non tender, non distended. Lower extremities are negative for pretibial pitting edema, deep calf tenderness, cyanosis or clubbing.   ECOG = 2  0 - Asymptomatic (Fully active, able to carry on all predisease activities without restriction)  1 - Symptomatic but completely ambulatory (Restricted in physically strenuous activity  but ambulatory and able to carry out work of a light or sedentary nature. For example, light housework, office work)  2 - Symptomatic, <50% in bed during the day (Ambulatory and capable of all self care but unable to carry out any work activities. Up and about more than 50% of waking hours)  3 - Symptomatic, >50% in bed, but not bedbound (Capable of only limited self-care, confined to bed or chair 50% or more of waking hours)  4 - Bedbound (Completely disabled. Cannot carry on any self-care. Totally confined to bed or chair)  5 - Death   Eustace Pen MM, Creech RH, Tormey DC, et al. [phone]). "Toxicity and response criteria of the [facility]". Esto Oncol. 5 (6): 649-55    LABORATORY DATA:  Lab Results  Component Value Date   WBC 8.3 07/20/2015   HGB 11.8* 07/20/2015  HCT 34.4* 07/20/2015   MCV 93.7 07/20/2015   PLT 347 07/20/2015   Lab Results  Component Value Date   NA 137 07/19/2015   K 3.8 07/19/2015   CL 109 07/19/2015   CO2 23 07/19/2015   Lab Results  Component Value Date   ALT 18 07/18/2015   AST 20 07/18/2015   ALKPHOS 46 07/18/2015   BILITOT 0.7 07/18/2015      RADIOGRAPHY: Dg Chest 2 View  07/18/2015  CLINICAL DATA:  Acute onset of hemoptysis. Personal history of lung cancer. Initial encounter. EXAM: CHEST  2 VIEW COMPARISON:  Chest radiograph performed 06/20/2015, and CTA of the chest performed 06/02/2015 FINDINGS: Right perihilar airspace opacification is grossly unchanged in appearance. The left lung remains relatively clear. No pleural effusion or pneumothorax is seen. The heart is normal in size. No acute osseous abnormalities identified. IMPRESSION: Right perihilar airspace opacification again noted, stable in appearance, likely reflecting posttreatment change, though if the patient's hemoptysis persists, bronchoscopy could be considered for further evaluation, as suggested on prior CTA. Electronically Signed   By: Garald Balding M.D.    On: 07/18/2015 02:27   Dg Chest 2 View  06/20/2015  CLINICAL DATA:  Intermittent chest pain for 3 days. Hemoptysis. History of lung cancer. Subsequent encounter. EXAM: CHEST  2 VIEW COMPARISON:  PA and lateral chest and CT chest 06/02/2015. CT chest 03/28/2015. FINDINGS: Right perihilar and right middle and lower lobe opacities appear unchanged. The left lung is clear. Heart size is normal. No pneumothorax or pleural effusion. IMPRESSION: Stable appearance of the chest. Perihilar opacities on the right likely reflect posttreatment change. No acute abnormality or finding to explain the patient's symptoms is identified. Electronically Signed   By: Inge Rise M.D.   On: 06/20/2015 09:20   Ir Angiogram Pulmonary Right Selective  07/18/2015  INDICATION: 45 year old female with a history of hemoptysis. This is recurrent hemoptysis, with known non-small cell lung carcinoma and previous treatment with radiation therapy. She has been treated in July 2016 for hemoptysis. She has had recurrent bleeding with admission Saturday July 1st, and presents now for repeat angiogram and possible embolization. EXAM: RIGHT PULMONARY ARTERIOGRAPHY; IR ULTRASOUND GUIDANCE VASC ACCESS RIGHT; ADDITIONAL ARTERIOGRAPHY; IR EMBO ART VEN HEMORR LYMPH EXTRAV INC GUIDE ROADMAPPING MEDICATIONS: No additional ANESTHESIA/SEDATION: Moderate (conscious) sedation was employed during this procedure. A total of Versed 4.0 mg and Fentanyl 100 mcg was administered intravenously. Moderate Sedation Time: 68 minutes. The patient's level of consciousness and vital signs were monitored continuously by radiology nursing throughout the procedure under my direct supervision. CONTRAST:  100 cc FLUOROSCOPY TIME:  Fluoroscopy Time: 23 minutes 24 seconds (989 mGy). COMPLICATIONS: None PROCEDURE: Informed consent was obtained from the patient following explanation of the procedure, risks, benefits and alternatives. The patient understands, agrees and consents  for the procedure. All questions were addressed. A time out was performed prior to the initiation of the procedure. Maximal barrier sterile technique utilized including caps, mask, sterile gowns, sterile gloves, large sterile drape, hand hygiene, and Betadine prep. Ultrasound survey of the right inguinal region was performed with images stored and sent to PACs. A micropuncture needle was used access the right common femoral artery under ultrasound. With excellent arterial blood flow returned, and an .018 micro wire was passed through the needle, observed enter the abdominal aorta under fluoroscopy. The needle was removed, and a micropuncture sheath was placed over the wire. The inner dilator and wire were removed, and an 035 Bentson wire was advanced under  fluoroscopy into the abdominal aorta. The sheath was removed and a standard 5 [place] vascular sheath was placed. The dilator was removed and the sheath was flushed. Mickelson catheter was advanced over the Bentson wire into the thoracic aorta. Double flush of the catheter was performed in the proximal descending thoracic aorta. Mickelson catheter was formed over the aortic arch, and the right bronchial artery was selected. Angiogram was performed. Micro catheter arrangement with a high-flow Renegade and 014 Fathom wire was introduced. Micro catheter was advanced beyond the origin of the supreme intercostal artery. Repeat angiogram was performed. Embolization was then performed with 300 - 500 micro meter embosphere. Angiogram was not performed. Micro catheter was withdrawn slightly and a final embolization was performed with 500 -700 micro meter embosphere. Final angiogram performed. The micro catheter system was removed, and additional segmental artery interrogation was performed. The thoracic 6 right segmental artery was interrogated. After initial angiogram, the micro catheter system was reintroduced, and an attempt at selecting hypertrophied early branch to the  right lower lobe was performed. During the attempted catheterization, the artery was in spasm/dissection, and a final angiogram at this segmental vessel demonstrated decreased flow. Thoracic 7 right segmental artery was interrogated. Again, abnormal early tortuous branch was identified with small contribution to the medial region of the lower right lung. Thoracic 8 right segmental artery interrogated. Thoracic 9 right segmental artery interrogated. Thoracic 10 right segmental artery interrogated. Angiogram of the right common femoral artery was performed. Catheters and wires were removed and an Exoseal device was deployed for hemostasis. Patient tolerated the procedure well and remained hemodynamically stable throughout. No complications were encountered and no significant blood loss encountered. FINDINGS: Ultrasound survey of the right inguinal region demonstrates widely pacer right common femoral artery. Initial angiogram of the right bronchial artery at the thoracic 5 level demonstrates significant contribution to the right hilar region and right lower lung via very abnormal vasculature, which have abnormal tortuous anatomy and enlarging diameter as they continue distally from the bronchial artery. The right supreme intercostal artery arises from the apex of the bronchial artery. Status post embolization, there is nearly no flow to the hilar region and right lower lung via this right bronchial artery. Angiogram of thoracic 7 segmental artery demonstrates and early abnormal tortuous branch contributing to the infrahilar right lower lung. During attempted selection with the micro catheter system, the artery was spasm/dissected, with final angiogram demonstrating decreased flow to this territory with patency of the segmental vessel maintained. Embolization was not performed at this level. Angiogram of thoracic 8 segmental artery demonstrate a similar early tortuous abnormal artery, with minimal contribution to the  infrahilar region. Angiogram of thoracic 9 segmental artery demonstrates no significant contribution. Angiogram of thoracic 10 segmental artery demonstrates no significant contribution. IMPRESSION: Status post angiogram of right bronchial artery and 5 additional right-sided segmental arteries, with embosphere embolization of the right bronchial artery for hemostasis. Status post Exoseal deployment. Signed, Dulcy Fanny. Earleen Newport, DO [facility] Radiology Electronically Signed   By: Corrie Mckusick D.O.   On: 07/18/2015 18:04   Ir Angiogram Pulmonary Right Selective  07/18/2015  INDICATION: 45 year old female with a history of hemoptysis. This is recurrent hemoptysis, with known non-small cell lung carcinoma and previous treatment with radiation therapy. She has been treated in July 2016 for hemoptysis. She has had recurrent bleeding with admission Saturday July 1st, and presents now for repeat angiogram and possible embolization. EXAM: RIGHT PULMONARY ARTERIOGRAPHY; IR ULTRASOUND GUIDANCE VASC ACCESS RIGHT;  ADDITIONAL ARTERIOGRAPHY; IR EMBO ART VEN HEMORR LYMPH EXTRAV INC GUIDE ROADMAPPING MEDICATIONS: No additional ANESTHESIA/SEDATION: Moderate (conscious) sedation was employed during this procedure. A total of Versed 4.0 mg and Fentanyl 100 mcg was administered intravenously. Moderate Sedation Time: 68 minutes. The patient's level of consciousness and vital signs were monitored continuously by radiology nursing throughout the procedure under my direct supervision. CONTRAST:  100 cc FLUOROSCOPY TIME:  Fluoroscopy Time: 23 minutes 24 seconds (989 mGy). COMPLICATIONS: None PROCEDURE: Informed consent was obtained from the patient following explanation of the procedure, risks, benefits and alternatives. The patient understands, agrees and consents for the procedure. All questions were addressed. A time out was performed prior to the initiation of the procedure. Maximal barrier  sterile technique utilized including caps, mask, sterile gowns, sterile gloves, large sterile drape, hand hygiene, and Betadine prep. Ultrasound survey of the right inguinal region was performed with images stored and sent to PACs. A micropuncture needle was used access the right common femoral artery under ultrasound. With excellent arterial blood flow returned, and an .018 micro wire was passed through the needle, observed enter the abdominal aorta under fluoroscopy. The needle was removed, and a micropuncture sheath was placed over the wire. The inner dilator and wire were removed, and an 035 Bentson wire was advanced under fluoroscopy into the abdominal aorta. The sheath was removed and a standard 5 [place] vascular sheath was placed. The dilator was removed and the sheath was flushed. Mickelson catheter was advanced over the Bentson wire into the thoracic aorta. Double flush of the catheter was performed in the proximal descending thoracic aorta. Mickelson catheter was formed over the aortic arch, and the right bronchial artery was selected. Angiogram was performed. Micro catheter arrangement with a high-flow Renegade and 014 Fathom wire was introduced. Micro catheter was advanced beyond the origin of the supreme intercostal artery. Repeat angiogram was performed. Embolization was then performed with 300 - 500 micro meter embosphere. Angiogram was not performed. Micro catheter was withdrawn slightly and a final embolization was performed with 500 -700 micro meter embosphere. Final angiogram performed. The micro catheter system was removed, and additional segmental artery interrogation was performed. The thoracic 6 right segmental artery was interrogated. After initial angiogram, the micro catheter system was reintroduced, and an attempt at selecting hypertrophied early branch to the right lower lobe was performed. During the attempted catheterization, the artery was in spasm/dissection, and a final angiogram at  this segmental vessel demonstrated decreased flow. Thoracic 7 right segmental artery was interrogated. Again, abnormal early tortuous branch was identified with small contribution to the medial region of the lower right lung. Thoracic 8 right segmental artery interrogated. Thoracic 9 right segmental artery interrogated. Thoracic 10 right segmental artery interrogated. Angiogram of the right common femoral artery was performed. Catheters and wires were removed and an Exoseal device was deployed for hemostasis. Patient tolerated the procedure well and remained hemodynamically stable throughout. No complications were encountered and no significant blood loss encountered. FINDINGS: Ultrasound survey of the right inguinal region demonstrates widely pacer right common femoral artery. Initial angiogram of the right bronchial artery at the thoracic 5 level demonstrates significant contribution to the right hilar region and right lower lung via very abnormal vasculature, which have abnormal tortuous anatomy and enlarging diameter as they continue distally from the bronchial artery. The right supreme intercostal artery arises from the apex of the bronchial artery. Status post embolization, there is nearly no flow to the hilar region and right lower lung via this right  bronchial artery. Angiogram of thoracic 7 segmental artery demonstrates and early abnormal tortuous branch contributing to the infrahilar right lower lung. During attempted selection with the micro catheter system, the artery was spasm/dissected, with final angiogram demonstrating decreased flow to this territory with patency of the segmental vessel maintained. Embolization was not performed at this level. Angiogram of thoracic 8 segmental artery demonstrate a similar early tortuous abnormal artery, with minimal contribution to the infrahilar region. Angiogram of thoracic 9 segmental artery demonstrates no significant contribution. Angiogram of thoracic 10  segmental artery demonstrates no significant contribution. IMPRESSION: Status post angiogram of right bronchial artery and 5 additional right-sided segmental arteries, with embosphere embolization of the right bronchial artery for hemostasis. Status post Exoseal deployment. Signed, Dulcy Fanny. Earleen Newport, DO [facility] Radiology Electronically Signed   By: Corrie Mckusick D.O.   On: 07/18/2015 18:04   Ir Angiogram Pulmonary Right Selective  07/18/2015  INDICATION: 45 year old female with a history of hemoptysis. This is recurrent hemoptysis, with known non-small cell lung carcinoma and previous treatment with radiation therapy. She has been treated in July 2016 for hemoptysis. She has had recurrent bleeding with admission Saturday July 1st, and presents now for repeat angiogram and possible embolization. EXAM: RIGHT PULMONARY ARTERIOGRAPHY; IR ULTRASOUND GUIDANCE VASC ACCESS RIGHT; ADDITIONAL ARTERIOGRAPHY; IR EMBO ART VEN HEMORR LYMPH EXTRAV INC GUIDE ROADMAPPING MEDICATIONS: No additional ANESTHESIA/SEDATION: Moderate (conscious) sedation was employed during this procedure. A total of Versed 4.0 mg and Fentanyl 100 mcg was administered intravenously. Moderate Sedation Time: 68 minutes. The patient's level of consciousness and vital signs were monitored continuously by radiology nursing throughout the procedure under my direct supervision. CONTRAST:  100 cc FLUOROSCOPY TIME:  Fluoroscopy Time: 23 minutes 24 seconds (989 mGy). COMPLICATIONS: None PROCEDURE: Informed consent was obtained from the patient following explanation of the procedure, risks, benefits and alternatives. The patient understands, agrees and consents for the procedure. All questions were addressed. A time out was performed prior to the initiation of the procedure. Maximal barrier sterile technique utilized including caps, mask, sterile gowns, sterile gloves, large sterile drape, hand hygiene, and  Betadine prep. Ultrasound survey of the right inguinal region was performed with images stored and sent to PACs. A micropuncture needle was used access the right common femoral artery under ultrasound. With excellent arterial blood flow returned, and an .018 micro wire was passed through the needle, observed enter the abdominal aorta under fluoroscopy. The needle was removed, and a micropuncture sheath was placed over the wire. The inner dilator and wire were removed, and an 035 Bentson wire was advanced under fluoroscopy into the abdominal aorta. The sheath was removed and a standard 5 [place] vascular sheath was placed. The dilator was removed and the sheath was flushed. Mickelson catheter was advanced over the Bentson wire into the thoracic aorta. Double flush of the catheter was performed in the proximal descending thoracic aorta. Mickelson catheter was formed over the aortic arch, and the right bronchial artery was selected. Angiogram was performed. Micro catheter arrangement with a high-flow Renegade and 014 Fathom wire was introduced. Micro catheter was advanced beyond the origin of the supreme intercostal artery. Repeat angiogram was performed. Embolization was then performed with 300 - 500 micro meter embosphere. Angiogram was not performed. Micro catheter was withdrawn slightly and a final embolization was performed with 500 -700 micro meter embosphere. Final angiogram performed. The micro catheter system was removed, and additional segmental artery interrogation was performed. The thoracic 6 right segmental artery was  interrogated. After initial angiogram, the micro catheter system was reintroduced, and an attempt at selecting hypertrophied early branch to the right lower lobe was performed. During the attempted catheterization, the artery was in spasm/dissection, and a final angiogram at this segmental vessel demonstrated decreased flow. Thoracic 7 right segmental artery was interrogated. Again, abnormal  early tortuous branch was identified with small contribution to the medial region of the lower right lung. Thoracic 8 right segmental artery interrogated. Thoracic 9 right segmental artery interrogated. Thoracic 10 right segmental artery interrogated. Angiogram of the right common femoral artery was performed. Catheters and wires were removed and an Exoseal device was deployed for hemostasis. Patient tolerated the procedure well and remained hemodynamically stable throughout. No complications were encountered and no significant blood loss encountered. FINDINGS: Ultrasound survey of the right inguinal region demonstrates widely pacer right common femoral artery. Initial angiogram of the right bronchial artery at the thoracic 5 level demonstrates significant contribution to the right hilar region and right lower lung via very abnormal vasculature, which have abnormal tortuous anatomy and enlarging diameter as they continue distally from the bronchial artery. The right supreme intercostal artery arises from the apex of the bronchial artery. Status post embolization, there is nearly no flow to the hilar region and right lower lung via this right bronchial artery. Angiogram of thoracic 7 segmental artery demonstrates and early abnormal tortuous branch contributing to the infrahilar right lower lung. During attempted selection with the micro catheter system, the artery was spasm/dissected, with final angiogram demonstrating decreased flow to this territory with patency of the segmental vessel maintained. Embolization was not performed at this level. Angiogram of thoracic 8 segmental artery demonstrate a similar early tortuous abnormal artery, with minimal contribution to the infrahilar region. Angiogram of thoracic 9 segmental artery demonstrates no significant contribution. Angiogram of thoracic 10 segmental artery demonstrates no significant contribution. IMPRESSION: Status post angiogram of right bronchial artery and 5  additional right-sided segmental arteries, with embosphere embolization of the right bronchial artery for hemostasis. Status post Exoseal deployment. Signed, Dulcy Fanny. Earleen Newport, DO [facility] Radiology Electronically Signed   By: Corrie Mckusick D.O.   On: 07/18/2015 18:04   Ir Angiogram Pulmonary Right Selective  07/18/2015  INDICATION: 45 year old female with a history of hemoptysis. This is recurrent hemoptysis, with known non-small cell lung carcinoma and previous treatment with radiation therapy. She has been treated in July 2016 for hemoptysis. She has had recurrent bleeding with admission Saturday July 1st, and presents now for repeat angiogram and possible embolization. EXAM: RIGHT PULMONARY ARTERIOGRAPHY; IR ULTRASOUND GUIDANCE VASC ACCESS RIGHT; ADDITIONAL ARTERIOGRAPHY; IR EMBO ART VEN HEMORR LYMPH EXTRAV INC GUIDE ROADMAPPING MEDICATIONS: No additional ANESTHESIA/SEDATION: Moderate (conscious) sedation was employed during this procedure. A total of Versed 4.0 mg and Fentanyl 100 mcg was administered intravenously. Moderate Sedation Time: 68 minutes. The patient's level of consciousness and vital signs were monitored continuously by radiology nursing throughout the procedure under my direct supervision. CONTRAST:  100 cc FLUOROSCOPY TIME:  Fluoroscopy Time: 23 minutes 24 seconds (989 mGy). COMPLICATIONS: None PROCEDURE: Informed consent was obtained from the patient following explanation of the procedure, risks, benefits and alternatives. The patient understands, agrees and consents for the procedure. All questions were addressed. A time out was performed prior to the initiation of the procedure. Maximal barrier sterile technique utilized including caps, mask, sterile gowns, sterile gloves, large sterile drape, hand hygiene, and Betadine prep. Ultrasound survey of the right inguinal region was performed with images stored  and sent to PACs. A micropuncture  needle was used access the right common femoral artery under ultrasound. With excellent arterial blood flow returned, and an .018 micro wire was passed through the needle, observed enter the abdominal aorta under fluoroscopy. The needle was removed, and a micropuncture sheath was placed over the wire. The inner dilator and wire were removed, and an 035 Bentson wire was advanced under fluoroscopy into the abdominal aorta. The sheath was removed and a standard 5 [place] vascular sheath was placed. The dilator was removed and the sheath was flushed. Mickelson catheter was advanced over the Bentson wire into the thoracic aorta. Double flush of the catheter was performed in the proximal descending thoracic aorta. Mickelson catheter was formed over the aortic arch, and the right bronchial artery was selected. Angiogram was performed. Micro catheter arrangement with a high-flow Renegade and 014 Fathom wire was introduced. Micro catheter was advanced beyond the origin of the supreme intercostal artery. Repeat angiogram was performed. Embolization was then performed with 300 - 500 micro meter embosphere. Angiogram was not performed. Micro catheter was withdrawn slightly and a final embolization was performed with 500 -700 micro meter embosphere. Final angiogram performed. The micro catheter system was removed, and additional segmental artery interrogation was performed. The thoracic 6 right segmental artery was interrogated. After initial angiogram, the micro catheter system was reintroduced, and an attempt at selecting hypertrophied early branch to the right lower lobe was performed. During the attempted catheterization, the artery was in spasm/dissection, and a final angiogram at this segmental vessel demonstrated decreased flow. Thoracic 7 right segmental artery was interrogated. Again, abnormal early tortuous branch was identified with small contribution to the medial region of the lower right lung. Thoracic 8 right  segmental artery interrogated. Thoracic 9 right segmental artery interrogated. Thoracic 10 right segmental artery interrogated. Angiogram of the right common femoral artery was performed. Catheters and wires were removed and an Exoseal device was deployed for hemostasis. Patient tolerated the procedure well and remained hemodynamically stable throughout. No complications were encountered and no significant blood loss encountered. FINDINGS: Ultrasound survey of the right inguinal region demonstrates widely pacer right common femoral artery. Initial angiogram of the right bronchial artery at the thoracic 5 level demonstrates significant contribution to the right hilar region and right lower lung via very abnormal vasculature, which have abnormal tortuous anatomy and enlarging diameter as they continue distally from the bronchial artery. The right supreme intercostal artery arises from the apex of the bronchial artery. Status post embolization, there is nearly no flow to the hilar region and right lower lung via this right bronchial artery. Angiogram of thoracic 7 segmental artery demonstrates and early abnormal tortuous branch contributing to the infrahilar right lower lung. During attempted selection with the micro catheter system, the artery was spasm/dissected, with final angiogram demonstrating decreased flow to this territory with patency of the segmental vessel maintained. Embolization was not performed at this level. Angiogram of thoracic 8 segmental artery demonstrate a similar early tortuous abnormal artery, with minimal contribution to the infrahilar region. Angiogram of thoracic 9 segmental artery demonstrates no significant contribution. Angiogram of thoracic 10 segmental artery demonstrates no significant contribution. IMPRESSION: Status post angiogram of right bronchial artery and 5 additional right-sided segmental arteries, with embosphere embolization of the right bronchial artery for hemostasis.  Status post Exoseal deployment. Signed, Dulcy Fanny. Earleen Newport, DO [facility] Radiology Electronically Signed   By: Corrie Mckusick D.O.   On: 07/18/2015 18:04   Ir  Angiogram Pulmonary Right Selective  07/18/2015  INDICATION: 45 year old female with a history of hemoptysis. This is recurrent hemoptysis, with known non-small cell lung carcinoma and previous treatment with radiation therapy. She has been treated in July 2016 for hemoptysis. She has had recurrent bleeding with admission Saturday July 1st, and presents now for repeat angiogram and possible embolization. EXAM: RIGHT PULMONARY ARTERIOGRAPHY; IR ULTRASOUND GUIDANCE VASC ACCESS RIGHT; ADDITIONAL ARTERIOGRAPHY; IR EMBO ART VEN HEMORR LYMPH EXTRAV INC GUIDE ROADMAPPING MEDICATIONS: No additional ANESTHESIA/SEDATION: Moderate (conscious) sedation was employed during this procedure. A total of Versed 4.0 mg and Fentanyl 100 mcg was administered intravenously. Moderate Sedation Time: 68 minutes. The patient's level of consciousness and vital signs were monitored continuously by radiology nursing throughout the procedure under my direct supervision. CONTRAST:  100 cc FLUOROSCOPY TIME:  Fluoroscopy Time: 23 minutes 24 seconds (989 mGy). COMPLICATIONS: None PROCEDURE: Informed consent was obtained from the patient following explanation of the procedure, risks, benefits and alternatives. The patient understands, agrees and consents for the procedure. All questions were addressed. A time out was performed prior to the initiation of the procedure. Maximal barrier sterile technique utilized including caps, mask, sterile gowns, sterile gloves, large sterile drape, hand hygiene, and Betadine prep. Ultrasound survey of the right inguinal region was performed with images stored and sent to PACs. A micropuncture needle was used access the right common femoral artery under ultrasound. With excellent arterial blood flow returned, and  an .018 micro wire was passed through the needle, observed enter the abdominal aorta under fluoroscopy. The needle was removed, and a micropuncture sheath was placed over the wire. The inner dilator and wire were removed, and an 035 Bentson wire was advanced under fluoroscopy into the abdominal aorta. The sheath was removed and a standard 5 [place] vascular sheath was placed. The dilator was removed and the sheath was flushed. Mickelson catheter was advanced over the Bentson wire into the thoracic aorta. Double flush of the catheter was performed in the proximal descending thoracic aorta. Mickelson catheter was formed over the aortic arch, and the right bronchial artery was selected. Angiogram was performed. Micro catheter arrangement with a high-flow Renegade and 014 Fathom wire was introduced. Micro catheter was advanced beyond the origin of the supreme intercostal artery. Repeat angiogram was performed. Embolization was then performed with 300 - 500 micro meter embosphere. Angiogram was not performed. Micro catheter was withdrawn slightly and a final embolization was performed with 500 -700 micro meter embosphere. Final angiogram performed. The micro catheter system was removed, and additional segmental artery interrogation was performed. The thoracic 6 right segmental artery was interrogated. After initial angiogram, the micro catheter system was reintroduced, and an attempt at selecting hypertrophied early branch to the right lower lobe was performed. During the attempted catheterization, the artery was in spasm/dissection, and a final angiogram at this segmental vessel demonstrated decreased flow. Thoracic 7 right segmental artery was interrogated. Again, abnormal early tortuous branch was identified with small contribution to the medial region of the lower right lung. Thoracic 8 right segmental artery interrogated. Thoracic 9 right segmental artery interrogated. Thoracic 10 right segmental artery interrogated.  Angiogram of the right common femoral artery was performed. Catheters and wires were removed and an Exoseal device was deployed for hemostasis. Patient tolerated the procedure well and remained hemodynamically stable throughout. No complications were encountered and no significant blood loss encountered. FINDINGS: Ultrasound survey of the right inguinal region demonstrates widely pacer right common femoral artery. Initial angiogram of the right bronchial artery  at the thoracic 5 level demonstrates significant contribution to the right hilar region and right lower lung via very abnormal vasculature, which have abnormal tortuous anatomy and enlarging diameter as they continue distally from the bronchial artery. The right supreme intercostal artery arises from the apex of the bronchial artery. Status post embolization, there is nearly no flow to the hilar region and right lower lung via this right bronchial artery. Angiogram of thoracic 7 segmental artery demonstrates and early abnormal tortuous branch contributing to the infrahilar right lower lung. During attempted selection with the micro catheter system, the artery was spasm/dissected, with final angiogram demonstrating decreased flow to this territory with patency of the segmental vessel maintained. Embolization was not performed at this level. Angiogram of thoracic 8 segmental artery demonstrate a similar early tortuous abnormal artery, with minimal contribution to the infrahilar region. Angiogram of thoracic 9 segmental artery demonstrates no significant contribution. Angiogram of thoracic 10 segmental artery demonstrates no significant contribution. IMPRESSION: Status post angiogram of right bronchial artery and 5 additional right-sided segmental arteries, with embosphere embolization of the right bronchial artery for hemostasis. Status post Exoseal deployment. Signed, Dulcy Fanny. Earleen Newport, DO [facility] Radiology  Electronically Signed   By: Corrie Mckusick D.O.   On: 07/18/2015 18:04   Ir Angiogram Pulmonary Right Selective  07/18/2015  INDICATION: 45 year old female with a history of hemoptysis. This is recurrent hemoptysis, with known non-small cell lung carcinoma and previous treatment with radiation therapy. She has been treated in July 2016 for hemoptysis. She has had recurrent bleeding with admission Saturday July 1st, and presents now for repeat angiogram and possible embolization. EXAM: RIGHT PULMONARY ARTERIOGRAPHY; IR ULTRASOUND GUIDANCE VASC ACCESS RIGHT; ADDITIONAL ARTERIOGRAPHY; IR EMBO ART VEN HEMORR LYMPH EXTRAV INC GUIDE ROADMAPPING MEDICATIONS: No additional ANESTHESIA/SEDATION: Moderate (conscious) sedation was employed during this procedure. A total of Versed 4.0 mg and Fentanyl 100 mcg was administered intravenously. Moderate Sedation Time: 68 minutes. The patient's level of consciousness and vital signs were monitored continuously by radiology nursing throughout the procedure under my direct supervision. CONTRAST:  100 cc FLUOROSCOPY TIME:  Fluoroscopy Time: 23 minutes 24 seconds (989 mGy). COMPLICATIONS: None PROCEDURE: Informed consent was obtained from the patient following explanation of the procedure, risks, benefits and alternatives. The patient understands, agrees and consents for the procedure. All questions were addressed. A time out was performed prior to the initiation of the procedure. Maximal barrier sterile technique utilized including caps, mask, sterile gowns, sterile gloves, large sterile drape, hand hygiene, and Betadine prep. Ultrasound survey of the right inguinal region was performed with images stored and sent to PACs. A micropuncture needle was used access the right common femoral artery under ultrasound. With excellent arterial blood flow returned, and an .018 micro wire was passed through the needle, observed enter the abdominal aorta under fluoroscopy. The needle was removed,  and a micropuncture sheath was placed over the wire. The inner dilator and wire were removed, and an 035 Bentson wire was advanced under fluoroscopy into the abdominal aorta. The sheath was removed and a standard 5 [place] vascular sheath was placed. The dilator was removed and the sheath was flushed. Mickelson catheter was advanced over the Bentson wire into the thoracic aorta. Double flush of the catheter was performed in the proximal descending thoracic aorta. Mickelson catheter was formed over the aortic arch, and the right bronchial artery was selected. Angiogram was performed. Micro catheter arrangement with a high-flow Renegade and 014 Fathom wire was introduced. Micro catheter  was advanced beyond the origin of the supreme intercostal artery. Repeat angiogram was performed. Embolization was then performed with 300 - 500 micro meter embosphere. Angiogram was not performed. Micro catheter was withdrawn slightly and a final embolization was performed with 500 -700 micro meter embosphere. Final angiogram performed. The micro catheter system was removed, and additional segmental artery interrogation was performed. The thoracic 6 right segmental artery was interrogated. After initial angiogram, the micro catheter system was reintroduced, and an attempt at selecting hypertrophied early branch to the right lower lobe was performed. During the attempted catheterization, the artery was in spasm/dissection, and a final angiogram at this segmental vessel demonstrated decreased flow. Thoracic 7 right segmental artery was interrogated. Again, abnormal early tortuous branch was identified with small contribution to the medial region of the lower right lung. Thoracic 8 right segmental artery interrogated. Thoracic 9 right segmental artery interrogated. Thoracic 10 right segmental artery interrogated. Angiogram of the right common femoral artery was performed. Catheters and wires were removed and an Exoseal device was deployed  for hemostasis. Patient tolerated the procedure well and remained hemodynamically stable throughout. No complications were encountered and no significant blood loss encountered. FINDINGS: Ultrasound survey of the right inguinal region demonstrates widely pacer right common femoral artery. Initial angiogram of the right bronchial artery at the thoracic 5 level demonstrates significant contribution to the right hilar region and right lower lung via very abnormal vasculature, which have abnormal tortuous anatomy and enlarging diameter as they continue distally from the bronchial artery. The right supreme intercostal artery arises from the apex of the bronchial artery. Status post embolization, there is nearly no flow to the hilar region and right lower lung via this right bronchial artery. Angiogram of thoracic 7 segmental artery demonstrates and early abnormal tortuous branch contributing to the infrahilar right lower lung. During attempted selection with the micro catheter system, the artery was spasm/dissected, with final angiogram demonstrating decreased flow to this territory with patency of the segmental vessel maintained. Embolization was not performed at this level. Angiogram of thoracic 8 segmental artery demonstrate a similar early tortuous abnormal artery, with minimal contribution to the infrahilar region. Angiogram of thoracic 9 segmental artery demonstrates no significant contribution. Angiogram of thoracic 10 segmental artery demonstrates no significant contribution. IMPRESSION: Status post angiogram of right bronchial artery and 5 additional right-sided segmental arteries, with embosphere embolization of the right bronchial artery for hemostasis. Status post Exoseal deployment. Signed, Dulcy Fanny. Earleen Newport, DO [facility] Radiology Electronically Signed   By: Corrie Mckusick D.O.   On: 07/18/2015 18:04   Ir Angiogram Selective Each Additional  Vessel  07/18/2015  INDICATION: 45 year old female with a history of hemoptysis. This is recurrent hemoptysis, with known non-small cell lung carcinoma and previous treatment with radiation therapy. She has been treated in July 2016 for hemoptysis. She has had recurrent bleeding with admission Saturday July 1st, and presents now for repeat angiogram and possible embolization. EXAM: RIGHT PULMONARY ARTERIOGRAPHY; IR ULTRASOUND GUIDANCE VASC ACCESS RIGHT; ADDITIONAL ARTERIOGRAPHY; IR EMBO ART VEN HEMORR LYMPH EXTRAV INC GUIDE ROADMAPPING MEDICATIONS: No additional ANESTHESIA/SEDATION: Moderate (conscious) sedation was employed during this procedure. A total of Versed 4.0 mg and Fentanyl 100 mcg was administered intravenously. Moderate Sedation Time: 68 minutes. The patient's level of consciousness and vital signs were monitored continuously by radiology nursing throughout the procedure under my direct supervision. CONTRAST:  100 cc FLUOROSCOPY TIME:  Fluoroscopy Time: 23 minutes 24 seconds (989 mGy). COMPLICATIONS: None PROCEDURE: Informed consent was  obtained from the patient following explanation of the procedure, risks, benefits and alternatives. The patient understands, agrees and consents for the procedure. All questions were addressed. A time out was performed prior to the initiation of the procedure. Maximal barrier sterile technique utilized including caps, mask, sterile gowns, sterile gloves, large sterile drape, hand hygiene, and Betadine prep. Ultrasound survey of the right inguinal region was performed with images stored and sent to PACs. A micropuncture needle was used access the right common femoral artery under ultrasound. With excellent arterial blood flow returned, and an .018 micro wire was passed through the needle, observed enter the abdominal aorta under fluoroscopy. The needle was removed, and a micropuncture sheath was placed over the wire. The inner dilator and wire were removed, and an 035  Bentson wire was advanced under fluoroscopy into the abdominal aorta. The sheath was removed and a standard 5 [place] vascular sheath was placed. The dilator was removed and the sheath was flushed. Mickelson catheter was advanced over the Bentson wire into the thoracic aorta. Double flush of the catheter was performed in the proximal descending thoracic aorta. Mickelson catheter was formed over the aortic arch, and the right bronchial artery was selected. Angiogram was performed. Micro catheter arrangement with a high-flow Renegade and 014 Fathom wire was introduced. Micro catheter was advanced beyond the origin of the supreme intercostal artery. Repeat angiogram was performed. Embolization was then performed with 300 - 500 micro meter embosphere. Angiogram was not performed. Micro catheter was withdrawn slightly and a final embolization was performed with 500 -700 micro meter embosphere. Final angiogram performed. The micro catheter system was removed, and additional segmental artery interrogation was performed. The thoracic 6 right segmental artery was interrogated. After initial angiogram, the micro catheter system was reintroduced, and an attempt at selecting hypertrophied early branch to the right lower lobe was performed. During the attempted catheterization, the artery was in spasm/dissection, and a final angiogram at this segmental vessel demonstrated decreased flow. Thoracic 7 right segmental artery was interrogated. Again, abnormal early tortuous branch was identified with small contribution to the medial region of the lower right lung. Thoracic 8 right segmental artery interrogated. Thoracic 9 right segmental artery interrogated. Thoracic 10 right segmental artery interrogated. Angiogram of the right common femoral artery was performed. Catheters and wires were removed and an Exoseal device was deployed for hemostasis. Patient tolerated the procedure well and remained hemodynamically stable throughout. No  complications were encountered and no significant blood loss encountered. FINDINGS: Ultrasound survey of the right inguinal region demonstrates widely pacer right common femoral artery. Initial angiogram of the right bronchial artery at the thoracic 5 level demonstrates significant contribution to the right hilar region and right lower lung via very abnormal vasculature, which have abnormal tortuous anatomy and enlarging diameter as they continue distally from the bronchial artery. The right supreme intercostal artery arises from the apex of the bronchial artery. Status post embolization, there is nearly no flow to the hilar region and right lower lung via this right bronchial artery. Angiogram of thoracic 7 segmental artery demonstrates and early abnormal tortuous branch contributing to the infrahilar right lower lung. During attempted selection with the micro catheter system, the artery was spasm/dissected, with final angiogram demonstrating decreased flow to this territory with patency of the segmental vessel maintained. Embolization was not performed at this level. Angiogram of thoracic 8 segmental artery demonstrate a similar early tortuous abnormal artery, with minimal contribution to the infrahilar region. Angiogram of thoracic 9 segmental artery demonstrates no  significant contribution. Angiogram of thoracic 10 segmental artery demonstrates no significant contribution. IMPRESSION: Status post angiogram of right bronchial artery and 5 additional right-sided segmental arteries, with embosphere embolization of the right bronchial artery for hemostasis. Status post Exoseal deployment. Signed, Dulcy Fanny. Earleen Newport, DO [facility] Radiology Electronically Signed   By: Corrie Mckusick D.O.   On: 07/18/2015 18:04   Dg Esophagus  07/06/2015  CLINICAL DATA:  Lung cancer undergoing radiation therapy. Odynophagia EXAM: ESOPHOGRAM / BARIUM SWALLOW / BARIUM TABLET STUDY  TECHNIQUE: Combined double contrast and single contrast examination performed using effervescent crystals, thick barium liquid, and thin barium liquid. The patient was observed with fluoroscopy swallowing a 13 mm barium sulphate tablet. FLUOROSCOPY TIME:  Radiation Exposure Index (as provided by the fluoroscopic device): 9.4 mGy If the device does not provide the exposure index: Fluoroscopy Time:  dictate in minutes and seconds Number of Acquired Images: COMPARISON:  CT chest 06/02/2015 FINDINGS: Normal swallowing function in the high cervical esophagus. No mucosal irregularity within the thoracic esophagus or distal esophagus. No stricture mass within the esophagus. GE junction is normal With patient in prone position, there is poor initiation of the primary stripping wave. Contrast remained in the proximal esophagus with to and fro motion and to the high cervical esophagus and hypopharynx. No tertiary contractions. 13 mm barium tablet passed the GE junction easily. IMPRESSION: 1. Esophageal dysmotility with poor initiation of the primary stripping wave and to and fro motion of barium column in the high cervical esophagus. No tertiary contractions. 2. No mucosal irregularity stricture mass in esophagus. 3. Barium tablet passed GE junction easily. Electronically Signed   By: Suzy Bouchard M.D.   On: 07/06/2015 10:40   Ir US Guide Vasc Access Right  07/18/2015  INDICATION: 45 year old female with a history of hemoptysis. This is recurrent hemoptysis, with known non-small cell lung carcinoma and previous treatment with radiation therapy. She has been treated in July 2016 for hemoptysis. She has had recurrent bleeding with admission Saturday July 1st, and presents now for repeat angiogram and possible embolization. EXAM: RIGHT PULMONARY ARTERIOGRAPHY; IR ULTRASOUND GUIDANCE VASC ACCESS RIGHT; ADDITIONAL ARTERIOGRAPHY; IR EMBO ART VEN HEMORR LYMPH EXTRAV INC GUIDE ROADMAPPING MEDICATIONS: No additional  ANESTHESIA/SEDATION: Moderate (conscious) sedation was employed during this procedure. A total of Versed 4.0 mg and Fentanyl 100 mcg was administered intravenously. Moderate Sedation Time: 68 minutes. The patient's level of consciousness and vital signs were monitored continuously by radiology nursing throughout the procedure under my direct supervision. CONTRAST:  100 cc FLUOROSCOPY TIME:  Fluoroscopy Time: 23 minutes 24 seconds (989 mGy). COMPLICATIONS: None PROCEDURE: Informed consent was obtained from the patient following explanation of the procedure, risks, benefits and alternatives. The patient understands, agrees and consents for the procedure. All questions were addressed. A time out was performed prior to the initiation of the procedure. Maximal barrier sterile technique utilized including caps, mask, sterile gowns, sterile gloves, large sterile drape, hand hygiene, and Betadine prep. Ultrasound survey of the right inguinal region was performed with images stored and sent to PACs. A micropuncture needle was used access the right common femoral artery under ultrasound. With excellent arterial blood flow returned, and an .018 micro wire was passed through the needle, observed enter the abdominal aorta under fluoroscopy. The needle was removed, and a micropuncture sheath was placed over the wire. The inner dilator and wire were removed, and an 035 Bentson wire was advanced under fluoroscopy into the abdominal aorta. The sheath was removed  and a standard 5 [place] vascular sheath was placed. The dilator was removed and the sheath was flushed. Mickelson catheter was advanced over the Bentson wire into the thoracic aorta. Double flush of the catheter was performed in the proximal descending thoracic aorta. Mickelson catheter was formed over the aortic arch, and the right bronchial artery was selected. Angiogram was performed. Micro catheter arrangement with a high-flow Renegade and 014 Fathom wire was introduced.  Micro catheter was advanced beyond the origin of the supreme intercostal artery. Repeat angiogram was performed. Embolization was then performed with 300 - 500 micro meter embosphere. Angiogram was not performed. Micro catheter was withdrawn slightly and a final embolization was performed with 500 -700 micro meter embosphere. Final angiogram performed. The micro catheter system was removed, and additional segmental artery interrogation was performed. The thoracic 6 right segmental artery was interrogated. After initial angiogram, the micro catheter system was reintroduced, and an attempt at selecting hypertrophied early branch to the right lower lobe was performed. During the attempted catheterization, the artery was in spasm/dissection, and a final angiogram at this segmental vessel demonstrated decreased flow. Thoracic 7 right segmental artery was interrogated. Again, abnormal early tortuous branch was identified with small contribution to the medial region of the lower right lung. Thoracic 8 right segmental artery interrogated. Thoracic 9 right segmental artery interrogated. Thoracic 10 right segmental artery interrogated. Angiogram of the right common femoral artery was performed. Catheters and wires were removed and an Exoseal device was deployed for hemostasis. Patient tolerated the procedure well and remained hemodynamically stable throughout. No complications were encountered and no significant blood loss encountered. FINDINGS: Ultrasound survey of the right inguinal region demonstrates widely pacer right common femoral artery. Initial angiogram of the right bronchial artery at the thoracic 5 level demonstrates significant contribution to the right hilar region and right lower lung via very abnormal vasculature, which have abnormal tortuous anatomy and enlarging diameter as they continue distally from the bronchial artery. The right supreme intercostal artery arises from the apex of the bronchial artery.  Status post embolization, there is nearly no flow to the hilar region and right lower lung via this right bronchial artery. Angiogram of thoracic 7 segmental artery demonstrates and early abnormal tortuous branch contributing to the infrahilar right lower lung. During attempted selection with the micro catheter system, the artery was spasm/dissected, with final angiogram demonstrating decreased flow to this territory with patency of the segmental vessel maintained. Embolization was not performed at this level. Angiogram of thoracic 8 segmental artery demonstrate a similar early tortuous abnormal artery, with minimal contribution to the infrahilar region. Angiogram of thoracic 9 segmental artery demonstrates no significant contribution. Angiogram of thoracic 10 segmental artery demonstrates no significant contribution. IMPRESSION: Status post angiogram of right bronchial artery and 5 additional right-sided segmental arteries, with embosphere embolization of the right bronchial artery for hemostasis. Status post Exoseal deployment. Signed, Dulcy Fanny. Earleen Newport, DO [facility] Radiology Electronically Signed   By: Corrie Mckusick D.O.   On: 07/18/2015 18:04   [place] Guide Roadmapping  07/18/2015  INDICATION: 45 year old female with a history of hemoptysis. This is recurrent hemoptysis, with known non-small cell lung carcinoma and previous treatment with radiation therapy. She has been treated in July 2016 for hemoptysis. She has had recurrent bleeding with admission Saturday July 1st, and presents now for repeat angiogram and possible embolization. EXAM: RIGHT PULMONARY ARTERIOGRAPHY; IR ULTRASOUND GUIDANCE VASC ACCESS RIGHT; ADDITIONAL ARTERIOGRAPHY;  IR EMBO ART VEN HEMORR LYMPH EXTRAV INC GUIDE ROADMAPPING MEDICATIONS: No additional ANESTHESIA/SEDATION: Moderate (conscious) sedation was employed during this procedure. A total of Versed 4.0  mg and Fentanyl 100 mcg was administered intravenously. Moderate Sedation Time: 68 minutes. The patient's level of consciousness and vital signs were monitored continuously by radiology nursing throughout the procedure under my direct supervision. CONTRAST:  100 cc FLUOROSCOPY TIME:  Fluoroscopy Time: 23 minutes 24 seconds (989 mGy). COMPLICATIONS: None PROCEDURE: Informed consent was obtained from the patient following explanation of the procedure, risks, benefits and alternatives. The patient understands, agrees and consents for the procedure. All questions were addressed. A time out was performed prior to the initiation of the procedure. Maximal barrier sterile technique utilized including caps, mask, sterile gowns, sterile gloves, large sterile drape, hand hygiene, and Betadine prep. Ultrasound survey of the right inguinal region was performed with images stored and sent to PACs. A micropuncture needle was used access the right common femoral artery under ultrasound. With excellent arterial blood flow returned, and an .018 micro wire was passed through the needle, observed enter the abdominal aorta under fluoroscopy. The needle was removed, and a micropuncture sheath was placed over the wire. The inner dilator and wire were removed, and an 035 Bentson wire was advanced under fluoroscopy into the abdominal aorta. The sheath was removed and a standard 5 [place] vascular sheath was placed. The dilator was removed and the sheath was flushed. Mickelson catheter was advanced over the Bentson wire into the thoracic aorta. Double flush of the catheter was performed in the proximal descending thoracic aorta. Mickelson catheter was formed over the aortic arch, and the right bronchial artery was selected. Angiogram was performed. Micro catheter arrangement with a high-flow Renegade and 014 Fathom wire was introduced. Micro catheter was advanced beyond the origin of the supreme intercostal artery. Repeat angiogram was  performed. Embolization was then performed with 300 - 500 micro meter embosphere. Angiogram was not performed. Micro catheter was withdrawn slightly and a final embolization was performed with 500 -700 micro meter embosphere. Final angiogram performed. The micro catheter system was removed, and additional segmental artery interrogation was performed. The thoracic 6 right segmental artery was interrogated. After initial angiogram, the micro catheter system was reintroduced, and an attempt at selecting hypertrophied early branch to the right lower lobe was performed. During the attempted catheterization, the artery was in spasm/dissection, and a final angiogram at this segmental vessel demonstrated decreased flow. Thoracic 7 right segmental artery was interrogated. Again, abnormal early tortuous branch was identified with small contribution to the medial region of the lower right lung. Thoracic 8 right segmental artery interrogated. Thoracic 9 right segmental artery interrogated. Thoracic 10 right segmental artery interrogated. Angiogram of the right common femoral artery was performed. Catheters and wires were removed and an Exoseal device was deployed for hemostasis. Patient tolerated the procedure well and remained hemodynamically stable throughout. No complications were encountered and no significant blood loss encountered. FINDINGS: Ultrasound survey of the right inguinal region demonstrates widely pacer right common femoral artery. Initial angiogram of the right bronchial artery at the thoracic 5 level demonstrates significant contribution to the right hilar region and right lower lung via very abnormal vasculature, which have abnormal tortuous anatomy and enlarging diameter as they continue distally from the bronchial artery. The right supreme intercostal artery arises from the apex of the bronchial artery. Status post embolization, there is nearly no flow to the hilar region and right lower lung via this right  bronchial  artery. Angiogram of thoracic 7 segmental artery demonstrates and early abnormal tortuous branch contributing to the infrahilar right lower lung. During attempted selection with the micro catheter system, the artery was spasm/dissected, with final angiogram demonstrating decreased flow to this territory with patency of the segmental vessel maintained. Embolization was not performed at this level. Angiogram of thoracic 8 segmental artery demonstrate a similar early tortuous abnormal artery, with minimal contribution to the infrahilar region. Angiogram of thoracic 9 segmental artery demonstrates no significant contribution. Angiogram of thoracic 10 segmental artery demonstrates no significant contribution. IMPRESSION: Status post angiogram of right bronchial artery and 5 additional right-sided segmental arteries, with embosphere embolization of the right bronchial artery for hemostasis. Status post Exoseal deployment. Signed, Dulcy Fanny. Earleen Newport, DO [facility] Radiology Electronically Signed   By: Corrie Mckusick D.O.   On: 07/18/2015 18:04       IMPRESSION: Stage IV (T2a, N1, M1b) non-small cell lung cancer, adenocarcinoma of the right lung with hemoptysis.   PLAN: Dr. Lisbeth Renshaw and Dr. Julien Nordmann have discussed this patient's case. We would recommend consideration of re-irradiating the patient's right lung malignancy for palliation of her symptoms. We discussed the recommendation for 30 Gy in 10 fractions to the site. We discussed risks, benefits, short, and long term effects. She is interested in moving forward with treatment. We will sim today and plan to begin radiation tomorrow.     Carola Rhine, PAC

## 2015-07-20 NOTE — Progress Notes (Signed)
DIAGNOSIS: Stage IV (T2a, N1, M1b) non-small cell lung cancer, adenocarcinoma presented with right middle lobe lung mass in addition to right hilar adenopathy as well as metastatic disease to the bone, brain as well as left adrenal metastasis diagnosed in July 2016  PRIOR THERAPY:  1) palliative radiotherapy to the brain as well as metastatic bone lesions in the pelvis. 2) Tarceva 150 mg by mouth daily started on 09/25/2014. Status post 5 months of treatment  CURRENT THERAPY:  1) Tarceva 100 mg by mouth daily started on 02/19/2015. Status post 4 months of treatment. 2) Xgeva 120 g subcutaneously monthly.  Subjective: The patient is seen and examined today. She was eating her breakfast this morning. She continues to have mild hemoptysis. She was admitted for the persistent hemoptysis and the patient underwent angiogram of the right bronchial artery and 5 additional right-sided segmental arteries with embosphere embolization of the right bronchial artery for hemostasis. Unfortunately she continues to have hemoptysis after the procedure. She is otherwise feeling fine with no specific complaints. She is under a lot of stress recently with her current condition and also because her husband keeps threatening her of divorce. She was tearful this morning. She denied having any nausea or vomiting. No fever or chills.  Objective: Vital signs in last 24 hours: Temp:  [97.9 F (36.6 C)-98.4 F (36.9 C)] 97.9 F (36.6 C) (07/05 0443) Pulse Rate:  [86-103] 86 (07/05 0443) Resp:  [14-16] 14 (07/05 0443) BP: (85-97)/(56-63) 90/62 mmHg (07/05 0450) SpO2:  [97 %-98 %] 98 % (07/05 0443)  Intake/Output from previous day: 07/04 0701 - 07/05 0700 In: 240 [P.O.:240] Out: -  Intake/Output this shift:    General appearance: alert, cooperative, fatigued and no distress Resp: wheezes bilaterally Cardio: regular rate and rhythm, S1, S2 normal, no murmur, click, rub or gallop GI: soft, non-tender; bowel  sounds normal; no masses,  no organomegaly Extremities: extremities normal, atraumatic, no cyanosis or edema  Lab Results:   Recent Labs  07/19/15 0509 07/20/15 0434  WBC 11.1* 8.3  HGB 12.2 11.8*  HCT 36.0 34.4*  PLT 355 347   BMET  Recent Labs  07/18/15 0453 07/19/15 0509  NA 137 137  K 3.8 3.8  CL 105 109  CO2 25 23  GLUCOSE 89 88  BUN 9 9  CREATININE 0.45 0.40*  CALCIUM 8.3* 7.5*    Studies/Results: Ir Angiogram Pulmonary Right Selective  07/18/2015  INDICATION: 45 year old female with a history of hemoptysis. This is recurrent hemoptysis, with known non-small cell lung carcinoma and previous treatment with radiation therapy. She has been treated in July 2016 for hemoptysis. She has had recurrent bleeding with admission Saturday July 1st, and presents now for repeat angiogram and possible embolization. EXAM: RIGHT PULMONARY ARTERIOGRAPHY; IR ULTRASOUND GUIDANCE VASC ACCESS RIGHT; ADDITIONAL ARTERIOGRAPHY; IR EMBO ART VEN HEMORR LYMPH EXTRAV INC GUIDE ROADMAPPING MEDICATIONS: No additional ANESTHESIA/SEDATION: Moderate (conscious) sedation was employed during this procedure. A total of Versed 4.0 mg and Fentanyl 100 mcg was administered intravenously. Moderate Sedation Time: 68 minutes. The patient's level of consciousness and vital signs were monitored continuously by radiology nursing throughout the procedure under my direct supervision. CONTRAST:  100 cc FLUOROSCOPY TIME:  Fluoroscopy Time: 23 minutes 24 seconds (989 mGy). COMPLICATIONS: None PROCEDURE: Informed consent was obtained from the patient following explanation of the procedure, risks, benefits and alternatives. The patient understands, agrees and consents for the procedure. All questions were addressed. A time out was performed prior to the initiation of the procedure.  Maximal barrier sterile technique utilized including caps, mask, sterile gowns, sterile gloves, large sterile drape, hand hygiene, and Betadine prep.  Ultrasound survey of the right inguinal region was performed with images stored and sent to PACs. A micropuncture needle was used access the right common femoral artery under ultrasound. With excellent arterial blood flow returned, and an .018 micro wire was passed through the needle, observed enter the abdominal aorta under fluoroscopy. The needle was removed, and a micropuncture sheath was placed over the wire. The inner dilator and wire were removed, and an 035 Bentson wire was advanced under fluoroscopy into the abdominal aorta. The sheath was removed and a standard 5 [place] vascular sheath was placed. The dilator was removed and the sheath was flushed. Mickelson catheter was advanced over the Bentson wire into the thoracic aorta. Double flush of the catheter was performed in the proximal descending thoracic aorta. Mickelson catheter was formed over the aortic arch, and the right bronchial artery was selected. Angiogram was performed. Micro catheter arrangement with a high-flow Renegade and 014 Fathom wire was introduced. Micro catheter was advanced beyond the origin of the supreme intercostal artery. Repeat angiogram was performed. Embolization was then performed with 300 - 500 micro meter embosphere. Angiogram was not performed. Micro catheter was withdrawn slightly and a final embolization was performed with 500 -700 micro meter embosphere. Final angiogram performed. The micro catheter system was removed, and additional segmental artery interrogation was performed. The thoracic 6 right segmental artery was interrogated. After initial angiogram, the micro catheter system was reintroduced, and an attempt at selecting hypertrophied early branch to the right lower lobe was performed. During the attempted catheterization, the artery was in spasm/dissection, and a final angiogram at this segmental vessel demonstrated decreased flow. Thoracic 7 right segmental artery was interrogated. Again, abnormal early tortuous  branch was identified with small contribution to the medial region of the lower right lung. Thoracic 8 right segmental artery interrogated. Thoracic 9 right segmental artery interrogated. Thoracic 10 right segmental artery interrogated. Angiogram of the right common femoral artery was performed. Catheters and wires were removed and an Exoseal device was deployed for hemostasis. Patient tolerated the procedure well and remained hemodynamically stable throughout. No complications were encountered and no significant blood loss encountered. FINDINGS: Ultrasound survey of the right inguinal region demonstrates widely pacer right common femoral artery. Initial angiogram of the right bronchial artery at the thoracic 5 level demonstrates significant contribution to the right hilar region and right lower lung via very abnormal vasculature, which have abnormal tortuous anatomy and enlarging diameter as they continue distally from the bronchial artery. The right supreme intercostal artery arises from the apex of the bronchial artery. Status post embolization, there is nearly no flow to the hilar region and right lower lung via this right bronchial artery. Angiogram of thoracic 7 segmental artery demonstrates and early abnormal tortuous branch contributing to the infrahilar right lower lung. During attempted selection with the micro catheter system, the artery was spasm/dissected, with final angiogram demonstrating decreased flow to this territory with patency of the segmental vessel maintained. Embolization was not performed at this level. Angiogram of thoracic 8 segmental artery demonstrate a similar early tortuous abnormal artery, with minimal contribution to the infrahilar region. Angiogram of thoracic 9 segmental artery demonstrates no significant contribution. Angiogram of thoracic 10 segmental artery demonstrates no significant contribution. IMPRESSION: Status post angiogram of right bronchial artery and 5 additional  right-sided segmental arteries, with embosphere embolization of the right bronchial artery for hemostasis. Status  post Exoseal deployment. Signed, Dulcy Fanny. Earleen Newport, DO [facility] Radiology Electronically Signed   By: Corrie Mckusick D.O.   On: 07/18/2015 18:04   Ir Angiogram Pulmonary Right Selective  07/18/2015  INDICATION: 45 year old female with a history of hemoptysis. This is recurrent hemoptysis, with known non-small cell lung carcinoma and previous treatment with radiation therapy. She has been treated in July 2016 for hemoptysis. She has had recurrent bleeding with admission Saturday July 1st, and presents now for repeat angiogram and possible embolization. EXAM: RIGHT PULMONARY ARTERIOGRAPHY; IR ULTRASOUND GUIDANCE VASC ACCESS RIGHT; ADDITIONAL ARTERIOGRAPHY; IR EMBO ART VEN HEMORR LYMPH EXTRAV INC GUIDE ROADMAPPING MEDICATIONS: No additional ANESTHESIA/SEDATION: Moderate (conscious) sedation was employed during this procedure. A total of Versed 4.0 mg and Fentanyl 100 mcg was administered intravenously. Moderate Sedation Time: 68 minutes. The patient's level of consciousness and vital signs were monitored continuously by radiology nursing throughout the procedure under my direct supervision. CONTRAST:  100 cc FLUOROSCOPY TIME:  Fluoroscopy Time: 23 minutes 24 seconds (989 mGy). COMPLICATIONS: None PROCEDURE: Informed consent was obtained from the patient following explanation of the procedure, risks, benefits and alternatives. The patient understands, agrees and consents for the procedure. All questions were addressed. A time out was performed prior to the initiation of the procedure. Maximal barrier sterile technique utilized including caps, mask, sterile gowns, sterile gloves, large sterile drape, hand hygiene, and Betadine prep. Ultrasound survey of the right inguinal region was performed with images stored and sent to PACs. A micropuncture needle was  used access the right common femoral artery under ultrasound. With excellent arterial blood flow returned, and an .018 micro wire was passed through the needle, observed enter the abdominal aorta under fluoroscopy. The needle was removed, and a micropuncture sheath was placed over the wire. The inner dilator and wire were removed, and an 035 Bentson wire was advanced under fluoroscopy into the abdominal aorta. The sheath was removed and a standard 5 [place] vascular sheath was placed. The dilator was removed and the sheath was flushed. Mickelson catheter was advanced over the Bentson wire into the thoracic aorta. Double flush of the catheter was performed in the proximal descending thoracic aorta. Mickelson catheter was formed over the aortic arch, and the right bronchial artery was selected. Angiogram was performed. Micro catheter arrangement with a high-flow Renegade and 014 Fathom wire was introduced. Micro catheter was advanced beyond the origin of the supreme intercostal artery. Repeat angiogram was performed. Embolization was then performed with 300 - 500 micro meter embosphere. Angiogram was not performed. Micro catheter was withdrawn slightly and a final embolization was performed with 500 -700 micro meter embosphere. Final angiogram performed. The micro catheter system was removed, and additional segmental artery interrogation was performed. The thoracic 6 right segmental artery was interrogated. After initial angiogram, the micro catheter system was reintroduced, and an attempt at selecting hypertrophied early branch to the right lower lobe was performed. During the attempted catheterization, the artery was in spasm/dissection, and a final angiogram at this segmental vessel demonstrated decreased flow. Thoracic 7 right segmental artery was interrogated. Again, abnormal early tortuous branch was identified with small contribution to the medial region of the lower right lung. Thoracic 8 right segmental artery  interrogated. Thoracic 9 right segmental artery interrogated. Thoracic 10 right segmental artery interrogated. Angiogram of the right common femoral artery was performed. Catheters and wires were removed and an Exoseal device was deployed for hemostasis. Patient tolerated the procedure well and remained hemodynamically stable throughout.  No complications were encountered and no significant blood loss encountered. FINDINGS: Ultrasound survey of the right inguinal region demonstrates widely pacer right common femoral artery. Initial angiogram of the right bronchial artery at the thoracic 5 level demonstrates significant contribution to the right hilar region and right lower lung via very abnormal vasculature, which have abnormal tortuous anatomy and enlarging diameter as they continue distally from the bronchial artery. The right supreme intercostal artery arises from the apex of the bronchial artery. Status post embolization, there is nearly no flow to the hilar region and right lower lung via this right bronchial artery. Angiogram of thoracic 7 segmental artery demonstrates and early abnormal tortuous branch contributing to the infrahilar right lower lung. During attempted selection with the micro catheter system, the artery was spasm/dissected, with final angiogram demonstrating decreased flow to this territory with patency of the segmental vessel maintained. Embolization was not performed at this level. Angiogram of thoracic 8 segmental artery demonstrate a similar early tortuous abnormal artery, with minimal contribution to the infrahilar region. Angiogram of thoracic 9 segmental artery demonstrates no significant contribution. Angiogram of thoracic 10 segmental artery demonstrates no significant contribution. IMPRESSION: Status post angiogram of right bronchial artery and 5 additional right-sided segmental arteries, with embosphere embolization of the right bronchial artery for hemostasis. Status post Exoseal  deployment. Signed, Dulcy Fanny. Earleen Newport, DO [facility] Radiology Electronically Signed   By: Corrie Mckusick D.O.   On: 07/18/2015 18:04   Ir Angiogram Pulmonary Right Selective  07/18/2015  INDICATION: 45 year old female with a history of hemoptysis. This is recurrent hemoptysis, with known non-small cell lung carcinoma and previous treatment with radiation therapy. She has been treated in July 2016 for hemoptysis. She has had recurrent bleeding with admission Saturday July 1st, and presents now for repeat angiogram and possible embolization. EXAM: RIGHT PULMONARY ARTERIOGRAPHY; IR ULTRASOUND GUIDANCE VASC ACCESS RIGHT; ADDITIONAL ARTERIOGRAPHY; IR EMBO ART VEN HEMORR LYMPH EXTRAV INC GUIDE ROADMAPPING MEDICATIONS: No additional ANESTHESIA/SEDATION: Moderate (conscious) sedation was employed during this procedure. A total of Versed 4.0 mg and Fentanyl 100 mcg was administered intravenously. Moderate Sedation Time: 68 minutes. The patient's level of consciousness and vital signs were monitored continuously by radiology nursing throughout the procedure under my direct supervision. CONTRAST:  100 cc FLUOROSCOPY TIME:  Fluoroscopy Time: 23 minutes 24 seconds (989 mGy). COMPLICATIONS: None PROCEDURE: Informed consent was obtained from the patient following explanation of the procedure, risks, benefits and alternatives. The patient understands, agrees and consents for the procedure. All questions were addressed. A time out was performed prior to the initiation of the procedure. Maximal barrier sterile technique utilized including caps, mask, sterile gowns, sterile gloves, large sterile drape, hand hygiene, and Betadine prep. Ultrasound survey of the right inguinal region was performed with images stored and sent to PACs. A micropuncture needle was used access the right common femoral artery under ultrasound. With excellent arterial blood flow returned, and an .018 micro wire  was passed through the needle, observed enter the abdominal aorta under fluoroscopy. The needle was removed, and a micropuncture sheath was placed over the wire. The inner dilator and wire were removed, and an 035 Bentson wire was advanced under fluoroscopy into the abdominal aorta. The sheath was removed and a standard 5 [place] vascular sheath was placed. The dilator was removed and the sheath was flushed. Mickelson catheter was advanced over the Bentson wire into the thoracic aorta. Double flush of the catheter was performed in the proximal descending thoracic aorta. Ann Held  catheter was formed over the aortic arch, and the right bronchial artery was selected. Angiogram was performed. Micro catheter arrangement with a high-flow Renegade and 014 Fathom wire was introduced. Micro catheter was advanced beyond the origin of the supreme intercostal artery. Repeat angiogram was performed. Embolization was then performed with 300 - 500 micro meter embosphere. Angiogram was not performed. Micro catheter was withdrawn slightly and a final embolization was performed with 500 -700 micro meter embosphere. Final angiogram performed. The micro catheter system was removed, and additional segmental artery interrogation was performed. The thoracic 6 right segmental artery was interrogated. After initial angiogram, the micro catheter system was reintroduced, and an attempt at selecting hypertrophied early branch to the right lower lobe was performed. During the attempted catheterization, the artery was in spasm/dissection, and a final angiogram at this segmental vessel demonstrated decreased flow. Thoracic 7 right segmental artery was interrogated. Again, abnormal early tortuous branch was identified with small contribution to the medial region of the lower right lung. Thoracic 8 right segmental artery interrogated. Thoracic 9 right segmental artery interrogated. Thoracic 10 right segmental artery interrogated. Angiogram of the  right common femoral artery was performed. Catheters and wires were removed and an Exoseal device was deployed for hemostasis. Patient tolerated the procedure well and remained hemodynamically stable throughout. No complications were encountered and no significant blood loss encountered. FINDINGS: Ultrasound survey of the right inguinal region demonstrates widely pacer right common femoral artery. Initial angiogram of the right bronchial artery at the thoracic 5 level demonstrates significant contribution to the right hilar region and right lower lung via very abnormal vasculature, which have abnormal tortuous anatomy and enlarging diameter as they continue distally from the bronchial artery. The right supreme intercostal artery arises from the apex of the bronchial artery. Status post embolization, there is nearly no flow to the hilar region and right lower lung via this right bronchial artery. Angiogram of thoracic 7 segmental artery demonstrates and early abnormal tortuous branch contributing to the infrahilar right lower lung. During attempted selection with the micro catheter system, the artery was spasm/dissected, with final angiogram demonstrating decreased flow to this territory with patency of the segmental vessel maintained. Embolization was not performed at this level. Angiogram of thoracic 8 segmental artery demonstrate a similar early tortuous abnormal artery, with minimal contribution to the infrahilar region. Angiogram of thoracic 9 segmental artery demonstrates no significant contribution. Angiogram of thoracic 10 segmental artery demonstrates no significant contribution. IMPRESSION: Status post angiogram of right bronchial artery and 5 additional right-sided segmental arteries, with embosphere embolization of the right bronchial artery for hemostasis. Status post Exoseal deployment. Signed, Dulcy Fanny. Earleen Newport, DO [facility] Radiology Electronically  Signed   By: Corrie Mckusick D.O.   On: 07/18/2015 18:04   Ir Angiogram Pulmonary Right Selective  07/18/2015  INDICATION: 45 year old female with a history of hemoptysis. This is recurrent hemoptysis, with known non-small cell lung carcinoma and previous treatment with radiation therapy. She has been treated in July 2016 for hemoptysis. She has had recurrent bleeding with admission Saturday July 1st, and presents now for repeat angiogram and possible embolization. EXAM: RIGHT PULMONARY ARTERIOGRAPHY; IR ULTRASOUND GUIDANCE VASC ACCESS RIGHT; ADDITIONAL ARTERIOGRAPHY; IR EMBO ART VEN HEMORR LYMPH EXTRAV INC GUIDE ROADMAPPING MEDICATIONS: No additional ANESTHESIA/SEDATION: Moderate (conscious) sedation was employed during this procedure. A total of Versed 4.0 mg and Fentanyl 100 mcg was administered intravenously. Moderate Sedation Time: 68 minutes. The patient's level of consciousness and vital signs were monitored continuously  by radiology nursing throughout the procedure under my direct supervision. CONTRAST:  100 cc FLUOROSCOPY TIME:  Fluoroscopy Time: 23 minutes 24 seconds (989 mGy). COMPLICATIONS: None PROCEDURE: Informed consent was obtained from the patient following explanation of the procedure, risks, benefits and alternatives. The patient understands, agrees and consents for the procedure. All questions were addressed. A time out was performed prior to the initiation of the procedure. Maximal barrier sterile technique utilized including caps, mask, sterile gowns, sterile gloves, large sterile drape, hand hygiene, and Betadine prep. Ultrasound survey of the right inguinal region was performed with images stored and sent to PACs. A micropuncture needle was used access the right common femoral artery under ultrasound. With excellent arterial blood flow returned, and an .018 micro wire was passed through the needle, observed enter the abdominal aorta under fluoroscopy. The needle was removed, and a  micropuncture sheath was placed over the wire. The inner dilator and wire were removed, and an 035 Bentson wire was advanced under fluoroscopy into the abdominal aorta. The sheath was removed and a standard 5 [place] vascular sheath was placed. The dilator was removed and the sheath was flushed. Mickelson catheter was advanced over the Bentson wire into the thoracic aorta. Double flush of the catheter was performed in the proximal descending thoracic aorta. Mickelson catheter was formed over the aortic arch, and the right bronchial artery was selected. Angiogram was performed. Micro catheter arrangement with a high-flow Renegade and 014 Fathom wire was introduced. Micro catheter was advanced beyond the origin of the supreme intercostal artery. Repeat angiogram was performed. Embolization was then performed with 300 - 500 micro meter embosphere. Angiogram was not performed. Micro catheter was withdrawn slightly and a final embolization was performed with 500 -700 micro meter embosphere. Final angiogram performed. The micro catheter system was removed, and additional segmental artery interrogation was performed. The thoracic 6 right segmental artery was interrogated. After initial angiogram, the micro catheter system was reintroduced, and an attempt at selecting hypertrophied early branch to the right lower lobe was performed. During the attempted catheterization, the artery was in spasm/dissection, and a final angiogram at this segmental vessel demonstrated decreased flow. Thoracic 7 right segmental artery was interrogated. Again, abnormal early tortuous branch was identified with small contribution to the medial region of the lower right lung. Thoracic 8 right segmental artery interrogated. Thoracic 9 right segmental artery interrogated. Thoracic 10 right segmental artery interrogated. Angiogram of the right common femoral artery was performed. Catheters and wires were removed and an Exoseal device was deployed for  hemostasis. Patient tolerated the procedure well and remained hemodynamically stable throughout. No complications were encountered and no significant blood loss encountered. FINDINGS: Ultrasound survey of the right inguinal region demonstrates widely pacer right common femoral artery. Initial angiogram of the right bronchial artery at the thoracic 5 level demonstrates significant contribution to the right hilar region and right lower lung via very abnormal vasculature, which have abnormal tortuous anatomy and enlarging diameter as they continue distally from the bronchial artery. The right supreme intercostal artery arises from the apex of the bronchial artery. Status post embolization, there is nearly no flow to the hilar region and right lower lung via this right bronchial artery. Angiogram of thoracic 7 segmental artery demonstrates and early abnormal tortuous branch contributing to the infrahilar right lower lung. During attempted selection with the micro catheter system, the artery was spasm/dissected, with final angiogram demonstrating decreased flow to this territory with patency of the segmental vessel maintained. Embolization was not performed  at this level. Angiogram of thoracic 8 segmental artery demonstrate a similar early tortuous abnormal artery, with minimal contribution to the infrahilar region. Angiogram of thoracic 9 segmental artery demonstrates no significant contribution. Angiogram of thoracic 10 segmental artery demonstrates no significant contribution. IMPRESSION: Status post angiogram of right bronchial artery and 5 additional right-sided segmental arteries, with embosphere embolization of the right bronchial artery for hemostasis. Status post Exoseal deployment. Signed, Dulcy Fanny. Earleen Newport, DO [facility] Radiology Electronically Signed   By: Corrie Mckusick D.O.   On: 07/18/2015 18:04   Ir Angiogram Pulmonary Right Selective  07/18/2015   INDICATION: 45 year old female with a history of hemoptysis. This is recurrent hemoptysis, with known non-small cell lung carcinoma and previous treatment with radiation therapy. She has been treated in July 2016 for hemoptysis. She has had recurrent bleeding with admission Saturday July 1st, and presents now for repeat angiogram and possible embolization. EXAM: RIGHT PULMONARY ARTERIOGRAPHY; IR ULTRASOUND GUIDANCE VASC ACCESS RIGHT; ADDITIONAL ARTERIOGRAPHY; IR EMBO ART VEN HEMORR LYMPH EXTRAV INC GUIDE ROADMAPPING MEDICATIONS: No additional ANESTHESIA/SEDATION: Moderate (conscious) sedation was employed during this procedure. A total of Versed 4.0 mg and Fentanyl 100 mcg was administered intravenously. Moderate Sedation Time: 68 minutes. The patient's level of consciousness and vital signs were monitored continuously by radiology nursing throughout the procedure under my direct supervision. CONTRAST:  100 cc FLUOROSCOPY TIME:  Fluoroscopy Time: 23 minutes 24 seconds (989 mGy). COMPLICATIONS: None PROCEDURE: Informed consent was obtained from the patient following explanation of the procedure, risks, benefits and alternatives. The patient understands, agrees and consents for the procedure. All questions were addressed. A time out was performed prior to the initiation of the procedure. Maximal barrier sterile technique utilized including caps, mask, sterile gowns, sterile gloves, large sterile drape, hand hygiene, and Betadine prep. Ultrasound survey of the right inguinal region was performed with images stored and sent to PACs. A micropuncture needle was used access the right common femoral artery under ultrasound. With excellent arterial blood flow returned, and an .018 micro wire was passed through the needle, observed enter the abdominal aorta under fluoroscopy. The needle was removed, and a micropuncture sheath was placed over the wire. The inner dilator and wire were removed, and an 035 Bentson wire was  advanced under fluoroscopy into the abdominal aorta. The sheath was removed and a standard 5 [place] vascular sheath was placed. The dilator was removed and the sheath was flushed. Mickelson catheter was advanced over the Bentson wire into the thoracic aorta. Double flush of the catheter was performed in the proximal descending thoracic aorta. Mickelson catheter was formed over the aortic arch, and the right bronchial artery was selected. Angiogram was performed. Micro catheter arrangement with a high-flow Renegade and 014 Fathom wire was introduced. Micro catheter was advanced beyond the origin of the supreme intercostal artery. Repeat angiogram was performed. Embolization was then performed with 300 - 500 micro meter embosphere. Angiogram was not performed. Micro catheter was withdrawn slightly and a final embolization was performed with 500 -700 micro meter embosphere. Final angiogram performed. The micro catheter system was removed, and additional segmental artery interrogation was performed. The thoracic 6 right segmental artery was interrogated. After initial angiogram, the micro catheter system was reintroduced, and an attempt at selecting hypertrophied early branch to the right lower lobe was performed. During the attempted catheterization, the artery was in spasm/dissection, and a final angiogram at this segmental vessel demonstrated decreased flow. Thoracic 7 right segmental artery was interrogated. Again,  abnormal early tortuous branch was identified with small contribution to the medial region of the lower right lung. Thoracic 8 right segmental artery interrogated. Thoracic 9 right segmental artery interrogated. Thoracic 10 right segmental artery interrogated. Angiogram of the right common femoral artery was performed. Catheters and wires were removed and an Exoseal device was deployed for hemostasis. Patient tolerated the procedure well and remained hemodynamically stable throughout. No complications  were encountered and no significant blood loss encountered. FINDINGS: Ultrasound survey of the right inguinal region demonstrates widely pacer right common femoral artery. Initial angiogram of the right bronchial artery at the thoracic 5 level demonstrates significant contribution to the right hilar region and right lower lung via very abnormal vasculature, which have abnormal tortuous anatomy and enlarging diameter as they continue distally from the bronchial artery. The right supreme intercostal artery arises from the apex of the bronchial artery. Status post embolization, there is nearly no flow to the hilar region and right lower lung via this right bronchial artery. Angiogram of thoracic 7 segmental artery demonstrates and early abnormal tortuous branch contributing to the infrahilar right lower lung. During attempted selection with the micro catheter system, the artery was spasm/dissected, with final angiogram demonstrating decreased flow to this territory with patency of the segmental vessel maintained. Embolization was not performed at this level. Angiogram of thoracic 8 segmental artery demonstrate a similar early tortuous abnormal artery, with minimal contribution to the infrahilar region. Angiogram of thoracic 9 segmental artery demonstrates no significant contribution. Angiogram of thoracic 10 segmental artery demonstrates no significant contribution. IMPRESSION: Status post angiogram of right bronchial artery and 5 additional right-sided segmental arteries, with embosphere embolization of the right bronchial artery for hemostasis. Status post Exoseal deployment. Signed, Dulcy Fanny. Earleen Newport, DO [facility] Radiology Electronically Signed   By: Corrie Mckusick D.O.   On: 07/18/2015 18:04   Ir Angiogram Pulmonary Right Selective  07/18/2015  INDICATION: 45 year old female with a history of hemoptysis. This is recurrent hemoptysis, with known non-small cell lung  carcinoma and previous treatment with radiation therapy. She has been treated in July 2016 for hemoptysis. She has had recurrent bleeding with admission Saturday July 1st, and presents now for repeat angiogram and possible embolization. EXAM: RIGHT PULMONARY ARTERIOGRAPHY; IR ULTRASOUND GUIDANCE VASC ACCESS RIGHT; ADDITIONAL ARTERIOGRAPHY; IR EMBO ART VEN HEMORR LYMPH EXTRAV INC GUIDE ROADMAPPING MEDICATIONS: No additional ANESTHESIA/SEDATION: Moderate (conscious) sedation was employed during this procedure. A total of Versed 4.0 mg and Fentanyl 100 mcg was administered intravenously. Moderate Sedation Time: 68 minutes. The patient's level of consciousness and vital signs were monitored continuously by radiology nursing throughout the procedure under my direct supervision. CONTRAST:  100 cc FLUOROSCOPY TIME:  Fluoroscopy Time: 23 minutes 24 seconds (989 mGy). COMPLICATIONS: None PROCEDURE: Informed consent was obtained from the patient following explanation of the procedure, risks, benefits and alternatives. The patient understands, agrees and consents for the procedure. All questions were addressed. A time out was performed prior to the initiation of the procedure. Maximal barrier sterile technique utilized including caps, mask, sterile gowns, sterile gloves, large sterile drape, hand hygiene, and Betadine prep. Ultrasound survey of the right inguinal region was performed with images stored and sent to PACs. A micropuncture needle was used access the right common femoral artery under ultrasound. With excellent arterial blood flow returned, and an .018 micro wire was passed through the needle, observed enter the abdominal aorta under fluoroscopy. The needle was removed, and a micropuncture sheath was placed over the wire.  The inner dilator and wire were removed, and an 035 Bentson wire was advanced under fluoroscopy into the abdominal aorta. The sheath was removed and a standard 5 [place] vascular sheath was placed.  The dilator was removed and the sheath was flushed. Mickelson catheter was advanced over the Bentson wire into the thoracic aorta. Double flush of the catheter was performed in the proximal descending thoracic aorta. Mickelson catheter was formed over the aortic arch, and the right bronchial artery was selected. Angiogram was performed. Micro catheter arrangement with a high-flow Renegade and 014 Fathom wire was introduced. Micro catheter was advanced beyond the origin of the supreme intercostal artery. Repeat angiogram was performed. Embolization was then performed with 300 - 500 micro meter embosphere. Angiogram was not performed. Micro catheter was withdrawn slightly and a final embolization was performed with 500 -700 micro meter embosphere. Final angiogram performed. The micro catheter system was removed, and additional segmental artery interrogation was performed. The thoracic 6 right segmental artery was interrogated. After initial angiogram, the micro catheter system was reintroduced, and an attempt at selecting hypertrophied early branch to the right lower lobe was performed. During the attempted catheterization, the artery was in spasm/dissection, and a final angiogram at this segmental vessel demonstrated decreased flow. Thoracic 7 right segmental artery was interrogated. Again, abnormal early tortuous branch was identified with small contribution to the medial region of the lower right lung. Thoracic 8 right segmental artery interrogated. Thoracic 9 right segmental artery interrogated. Thoracic 10 right segmental artery interrogated. Angiogram of the right common femoral artery was performed. Catheters and wires were removed and an Exoseal device was deployed for hemostasis. Patient tolerated the procedure well and remained hemodynamically stable throughout. No complications were encountered and no significant blood loss encountered. FINDINGS: Ultrasound survey of the right inguinal region demonstrates  widely pacer right common femoral artery. Initial angiogram of the right bronchial artery at the thoracic 5 level demonstrates significant contribution to the right hilar region and right lower lung via very abnormal vasculature, which have abnormal tortuous anatomy and enlarging diameter as they continue distally from the bronchial artery. The right supreme intercostal artery arises from the apex of the bronchial artery. Status post embolization, there is nearly no flow to the hilar region and right lower lung via this right bronchial artery. Angiogram of thoracic 7 segmental artery demonstrates and early abnormal tortuous branch contributing to the infrahilar right lower lung. During attempted selection with the micro catheter system, the artery was spasm/dissected, with final angiogram demonstrating decreased flow to this territory with patency of the segmental vessel maintained. Embolization was not performed at this level. Angiogram of thoracic 8 segmental artery demonstrate a similar early tortuous abnormal artery, with minimal contribution to the infrahilar region. Angiogram of thoracic 9 segmental artery demonstrates no significant contribution. Angiogram of thoracic 10 segmental artery demonstrates no significant contribution. IMPRESSION: Status post angiogram of right bronchial artery and 5 additional right-sided segmental arteries, with embosphere embolization of the right bronchial artery for hemostasis. Status post Exoseal deployment. Signed, Dulcy Fanny. Earleen Newport, DO [facility] Radiology Electronically Signed   By: Corrie Mckusick D.O.   On: 07/18/2015 18:04   Ir Angiogram Selective Each Additional Vessel  07/18/2015  INDICATION: 45 year old female with a history of hemoptysis. This is recurrent hemoptysis, with known non-small cell lung carcinoma and previous treatment with radiation therapy. She has been treated in July 2016 for hemoptysis. She has had  recurrent bleeding with admission Saturday July 1st, and presents  now for repeat angiogram and possible embolization. EXAM: RIGHT PULMONARY ARTERIOGRAPHY; IR ULTRASOUND GUIDANCE VASC ACCESS RIGHT; ADDITIONAL ARTERIOGRAPHY; IR EMBO ART VEN HEMORR LYMPH EXTRAV INC GUIDE ROADMAPPING MEDICATIONS: No additional ANESTHESIA/SEDATION: Moderate (conscious) sedation was employed during this procedure. A total of Versed 4.0 mg and Fentanyl 100 mcg was administered intravenously. Moderate Sedation Time: 68 minutes. The patient's level of consciousness and vital signs were monitored continuously by radiology nursing throughout the procedure under my direct supervision. CONTRAST:  100 cc FLUOROSCOPY TIME:  Fluoroscopy Time: 23 minutes 24 seconds (989 mGy). COMPLICATIONS: None PROCEDURE: Informed consent was obtained from the patient following explanation of the procedure, risks, benefits and alternatives. The patient understands, agrees and consents for the procedure. All questions were addressed. A time out was performed prior to the initiation of the procedure. Maximal barrier sterile technique utilized including caps, mask, sterile gowns, sterile gloves, large sterile drape, hand hygiene, and Betadine prep. Ultrasound survey of the right inguinal region was performed with images stored and sent to PACs. A micropuncture needle was used access the right common femoral artery under ultrasound. With excellent arterial blood flow returned, and an .018 micro wire was passed through the needle, observed enter the abdominal aorta under fluoroscopy. The needle was removed, and a micropuncture sheath was placed over the wire. The inner dilator and wire were removed, and an 035 Bentson wire was advanced under fluoroscopy into the abdominal aorta. The sheath was removed and a standard 5 [place] vascular sheath was placed. The dilator was removed and the sheath was flushed. Mickelson catheter was advanced over the Bentson wire into the  thoracic aorta. Double flush of the catheter was performed in the proximal descending thoracic aorta. Mickelson catheter was formed over the aortic arch, and the right bronchial artery was selected. Angiogram was performed. Micro catheter arrangement with a high-flow Renegade and 014 Fathom wire was introduced. Micro catheter was advanced beyond the origin of the supreme intercostal artery. Repeat angiogram was performed. Embolization was then performed with 300 - 500 micro meter embosphere. Angiogram was not performed. Micro catheter was withdrawn slightly and a final embolization was performed with 500 -700 micro meter embosphere. Final angiogram performed. The micro catheter system was removed, and additional segmental artery interrogation was performed. The thoracic 6 right segmental artery was interrogated. After initial angiogram, the micro catheter system was reintroduced, and an attempt at selecting hypertrophied early branch to the right lower lobe was performed. During the attempted catheterization, the artery was in spasm/dissection, and a final angiogram at this segmental vessel demonstrated decreased flow. Thoracic 7 right segmental artery was interrogated. Again, abnormal early tortuous branch was identified with small contribution to the medial region of the lower right lung. Thoracic 8 right segmental artery interrogated. Thoracic 9 right segmental artery interrogated. Thoracic 10 right segmental artery interrogated. Angiogram of the right common femoral artery was performed. Catheters and wires were removed and an Exoseal device was deployed for hemostasis. Patient tolerated the procedure well and remained hemodynamically stable throughout. No complications were encountered and no significant blood loss encountered. FINDINGS: Ultrasound survey of the right inguinal region demonstrates widely pacer right common femoral artery. Initial angiogram of the right bronchial artery at the thoracic 5 level  demonstrates significant contribution to the right hilar region and right lower lung via very abnormal vasculature, which have abnormal tortuous anatomy and enlarging diameter as they continue distally from the bronchial artery. The right supreme intercostal artery arises from the apex of the bronchial artery. Status post  embolization, there is nearly no flow to the hilar region and right lower lung via this right bronchial artery. Angiogram of thoracic 7 segmental artery demonstrates and early abnormal tortuous branch contributing to the infrahilar right lower lung. During attempted selection with the micro catheter system, the artery was spasm/dissected, with final angiogram demonstrating decreased flow to this territory with patency of the segmental vessel maintained. Embolization was not performed at this level. Angiogram of thoracic 8 segmental artery demonstrate a similar early tortuous abnormal artery, with minimal contribution to the infrahilar region. Angiogram of thoracic 9 segmental artery demonstrates no significant contribution. Angiogram of thoracic 10 segmental artery demonstrates no significant contribution. IMPRESSION: Status post angiogram of right bronchial artery and 5 additional right-sided segmental arteries, with embosphere embolization of the right bronchial artery for hemostasis. Status post Exoseal deployment. Signed, Dulcy Fanny. Earleen Newport, DO [facility] Radiology Electronically Signed   By: Corrie Mckusick D.O.   On: 07/18/2015 18:04   Ir US Guide Vasc Access Right  07/18/2015  INDICATION: 45 year old female with a history of hemoptysis. This is recurrent hemoptysis, with known non-small cell lung carcinoma and previous treatment with radiation therapy. She has been treated in July 2016 for hemoptysis. She has had recurrent bleeding with admission Saturday July 1st, and presents now for repeat angiogram and possible embolization. EXAM: RIGHT  PULMONARY ARTERIOGRAPHY; IR ULTRASOUND GUIDANCE VASC ACCESS RIGHT; ADDITIONAL ARTERIOGRAPHY; IR EMBO ART VEN HEMORR LYMPH EXTRAV INC GUIDE ROADMAPPING MEDICATIONS: No additional ANESTHESIA/SEDATION: Moderate (conscious) sedation was employed during this procedure. A total of Versed 4.0 mg and Fentanyl 100 mcg was administered intravenously. Moderate Sedation Time: 68 minutes. The patient's level of consciousness and vital signs were monitored continuously by radiology nursing throughout the procedure under my direct supervision. CONTRAST:  100 cc FLUOROSCOPY TIME:  Fluoroscopy Time: 23 minutes 24 seconds (989 mGy). COMPLICATIONS: None PROCEDURE: Informed consent was obtained from the patient following explanation of the procedure, risks, benefits and alternatives. The patient understands, agrees and consents for the procedure. All questions were addressed. A time out was performed prior to the initiation of the procedure. Maximal barrier sterile technique utilized including caps, mask, sterile gowns, sterile gloves, large sterile drape, hand hygiene, and Betadine prep. Ultrasound survey of the right inguinal region was performed with images stored and sent to PACs. A micropuncture needle was used access the right common femoral artery under ultrasound. With excellent arterial blood flow returned, and an .018 micro wire was passed through the needle, observed enter the abdominal aorta under fluoroscopy. The needle was removed, and a micropuncture sheath was placed over the wire. The inner dilator and wire were removed, and an 035 Bentson wire was advanced under fluoroscopy into the abdominal aorta. The sheath was removed and a standard 5 [place] vascular sheath was placed. The dilator was removed and the sheath was flushed. Mickelson catheter was advanced over the Bentson wire into the thoracic aorta. Double flush of the catheter was performed in the proximal descending thoracic aorta. Mickelson catheter was formed  over the aortic arch, and the right bronchial artery was selected. Angiogram was performed. Micro catheter arrangement with a high-flow Renegade and 014 Fathom wire was introduced. Micro catheter was advanced beyond the origin of the supreme intercostal artery. Repeat angiogram was performed. Embolization was then performed with 300 - 500 micro meter embosphere. Angiogram was not performed. Micro catheter was withdrawn slightly and a final embolization was performed with 500 -700 micro meter embosphere. Final angiogram performed. The micro  catheter system was removed, and additional segmental artery interrogation was performed. The thoracic 6 right segmental artery was interrogated. After initial angiogram, the micro catheter system was reintroduced, and an attempt at selecting hypertrophied early branch to the right lower lobe was performed. During the attempted catheterization, the artery was in spasm/dissection, and a final angiogram at this segmental vessel demonstrated decreased flow. Thoracic 7 right segmental artery was interrogated. Again, abnormal early tortuous branch was identified with small contribution to the medial region of the lower right lung. Thoracic 8 right segmental artery interrogated. Thoracic 9 right segmental artery interrogated. Thoracic 10 right segmental artery interrogated. Angiogram of the right common femoral artery was performed. Catheters and wires were removed and an Exoseal device was deployed for hemostasis. Patient tolerated the procedure well and remained hemodynamically stable throughout. No complications were encountered and no significant blood loss encountered. FINDINGS: Ultrasound survey of the right inguinal region demonstrates widely pacer right common femoral artery. Initial angiogram of the right bronchial artery at the thoracic 5 level demonstrates significant contribution to the right hilar region and right lower lung via very abnormal vasculature, which have  abnormal tortuous anatomy and enlarging diameter as they continue distally from the bronchial artery. The right supreme intercostal artery arises from the apex of the bronchial artery. Status post embolization, there is nearly no flow to the hilar region and right lower lung via this right bronchial artery. Angiogram of thoracic 7 segmental artery demonstrates and early abnormal tortuous branch contributing to the infrahilar right lower lung. During attempted selection with the micro catheter system, the artery was spasm/dissected, with final angiogram demonstrating decreased flow to this territory with patency of the segmental vessel maintained. Embolization was not performed at this level. Angiogram of thoracic 8 segmental artery demonstrate a similar early tortuous abnormal artery, with minimal contribution to the infrahilar region. Angiogram of thoracic 9 segmental artery demonstrates no significant contribution. Angiogram of thoracic 10 segmental artery demonstrates no significant contribution. IMPRESSION: Status post angiogram of right bronchial artery and 5 additional right-sided segmental arteries, with embosphere embolization of the right bronchial artery for hemostasis. Status post Exoseal deployment. Signed, Dulcy Fanny. Earleen Newport, DO [facility] Electronically Signed   By: Corrie Mckusick D.O.   On: 07/18/2015 18:04   [place] Guide Roadmapping  07/18/2015  INDICATION: 45 year old female with a history of hemoptysis. This is recurrent hemoptysis, with known non-small cell lung carcinoma and previous treatment with radiation therapy. She has been treated in July 2016 for hemoptysis. She has had recurrent bleeding with admission Saturday July 1st, and presents now for repeat angiogram and possible embolization. EXAM: RIGHT PULMONARY ARTERIOGRAPHY; IR ULTRASOUND GUIDANCE VASC ACCESS RIGHT; ADDITIONAL ARTERIOGRAPHY; IR EMBO  ART VEN HEMORR LYMPH EXTRAV INC GUIDE ROADMAPPING MEDICATIONS: No additional ANESTHESIA/SEDATION: Moderate (conscious) sedation was employed during this procedure. A total of Versed 4.0 mg and Fentanyl 100 mcg was administered intravenously. Moderate Sedation Time: 68 minutes. The patient's level of consciousness and vital signs were monitored continuously by radiology nursing throughout the procedure under my direct supervision. CONTRAST:  100 cc FLUOROSCOPY TIME:  Fluoroscopy Time: 23 minutes 24 seconds (989 mGy). COMPLICATIONS: None PROCEDURE: Informed consent was obtained from the patient following explanation of the procedure, risks, benefits and alternatives. The patient understands, agrees and consents for the procedure. All questions were addressed. A time out was performed prior to the initiation of the procedure. Maximal barrier sterile technique utilized including caps,  mask, sterile gowns, sterile gloves, large sterile drape, hand hygiene, and Betadine prep. Ultrasound survey of the right inguinal region was performed with images stored and sent to PACs. A micropuncture needle was used access the right common femoral artery under ultrasound. With excellent arterial blood flow returned, and an .018 micro wire was passed through the needle, observed enter the abdominal aorta under fluoroscopy. The needle was removed, and a micropuncture sheath was placed over the wire. The inner dilator and wire were removed, and an 035 Bentson wire was advanced under fluoroscopy into the abdominal aorta. The sheath was removed and a standard 5 [place] vascular sheath was placed. The dilator was removed and the sheath was flushed. Mickelson catheter was advanced over the Bentson wire into the thoracic aorta. Double flush of the catheter was performed in the proximal descending thoracic aorta. Mickelson catheter was formed over the aortic arch, and the right bronchial artery was selected. Angiogram was performed. Micro  catheter arrangement with a high-flow Renegade and 014 Fathom wire was introduced. Micro catheter was advanced beyond the origin of the supreme intercostal artery. Repeat angiogram was performed. Embolization was then performed with 300 - 500 micro meter embosphere. Angiogram was not performed. Micro catheter was withdrawn slightly and a final embolization was performed with 500 -700 micro meter embosphere. Final angiogram performed. The micro catheter system was removed, and additional segmental artery interrogation was performed. The thoracic 6 right segmental artery was interrogated. After initial angiogram, the micro catheter system was reintroduced, and an attempt at selecting hypertrophied early branch to the right lower lobe was performed. During the attempted catheterization, the artery was in spasm/dissection, and a final angiogram at this segmental vessel demonstrated decreased flow. Thoracic 7 right segmental artery was interrogated. Again, abnormal early tortuous branch was identified with small contribution to the medial region of the lower right lung. Thoracic 8 right segmental artery interrogated. Thoracic 9 right segmental artery interrogated. Thoracic 10 right segmental artery interrogated. Angiogram of the right common femoral artery was performed. Catheters and wires were removed and an Exoseal device was deployed for hemostasis. Patient tolerated the procedure well and remained hemodynamically stable throughout. No complications were encountered and no significant blood loss encountered. FINDINGS: Ultrasound survey of the right inguinal region demonstrates widely pacer right common femoral artery. Initial angiogram of the right bronchial artery at the thoracic 5 level demonstrates significant contribution to the right hilar region and right lower lung via very abnormal vasculature, which have abnormal tortuous anatomy and enlarging diameter as they continue distally from the bronchial artery. The  right supreme intercostal artery arises from the apex of the bronchial artery. Status post embolization, there is nearly no flow to the hilar region and right lower lung via this right bronchial artery. Angiogram of thoracic 7 segmental artery demonstrates and early abnormal tortuous branch contributing to the infrahilar right lower lung. During attempted selection with the micro catheter system, the artery was spasm/dissected, with final angiogram demonstrating decreased flow to this territory with patency of the segmental vessel maintained. Embolization was not performed at this level. Angiogram of thoracic 8 segmental artery demonstrate a similar early tortuous abnormal artery, with minimal contribution to the infrahilar region. Angiogram of thoracic 9 segmental artery demonstrates no significant contribution. Angiogram of thoracic 10 segmental artery demonstrates no significant contribution. IMPRESSION: Status post angiogram of right bronchial artery and 5 additional right-sided segmental arteries, with embosphere embolization of the right bronchial artery for hemostasis. Status post Exoseal deployment. Signed, Yvone Neu. Loreta Ave, DO  [facility] Electronically Signed   By: Corrie Mckusick D.O.   On: 07/18/2015 18:04    Medications: I have reviewed the patient's current medications.  CODE STATUS: Full code.  Assessment/Plan: 1) metastatic non-small cell lung cancer, adenocarcinoma with positive EGFR mutation currently on treatment with Tarceva 100 mg by mouth daily and tolerating her treatment well except for the skin rash and few episodes of diarrhea. Her recent imaging studies showed no evidence for disease progression and the patient was advised to continue on her treatment with Tarceva. 2) persistent hemoptysis. Status post recent bronchoscopy with questionable hemoptysis coming from the right lower lobe lesion. Her symptoms did not improve with  the embolization procedure. I discussed with the patient consideration of palliative radiotherapy to the right lower lobe lesion. The patient was also advised of the high risk of persistent hemoptysis or even death if it's not well controlled. She is expected to see Dr. Lisbeth Renshaw later today for evaluation and discussion of this option. She understand her poor prognosis. She is hopeful but tearful at times. 3) social issues: The patient is currently under a lot of stress. Her husband has been talking to her about divorce a lot recently. I would recommend for the patient to see the social worker during this admission for further evaluation of her current home situation. Thank you so much for taking good care of Mr. Tipps. I will continue to follow up the patient with you and assist in her management an as-needed basis.    LOS: 2 days    Deloma Spindle K. 07/20/2015

## 2015-07-20 NOTE — Progress Notes (Signed)
Pt coughed up 20 mls of bloody liquid this am. No c/o pain or discomfort. On-coming shift notified.

## 2015-07-21 ENCOUNTER — Ambulatory Visit
Admit: 2015-07-21 | Discharge: 2015-07-21 | Disposition: A | Discharge status: Home / Self Care | Payer: Medicaid Other | Attending: Radiation Oncology | Admitting: Radiation Oncology

## 2015-07-21 DIAGNOSIS — E440 Moderate protein-calorie malnutrition: Secondary | ICD-10-CM | POA: Insufficient documentation

## 2015-07-21 LAB — GLUCOSE, CAPILLARY: Glucose-Capillary: 99 mg/dL (ref 65–99)

## 2015-07-21 MED ORDER — HYDROCOD POLST-CPM POLST ER 10-8 MG/5ML PO SUER: 5.0000 mL | Freq: Two times a day (BID) | ORAL | Status: DC | PRN

## 2015-07-21 MED ORDER — BOOST / RESOURCE BREEZE PO LIQD
1.0000 | Freq: Two times a day (BID) | ORAL | Status: DC
  Administered 2015-07-21: 1 via ORAL

## 2015-07-21 MED ORDER — ALBUTEROL SULFATE HFA 108 (90 BASE) MCG/ACT IN AERS: 2.0000 | INHALATION_SPRAY | Freq: Four times a day (QID) | RESPIRATORY_TRACT | Status: DC | PRN

## 2015-07-21 MED ORDER — BENZONATATE 100 MG PO CAPS: 100.0000 mg | ORAL_CAPSULE | Freq: Three times a day (TID) | ORAL | Status: DC | PRN

## 2015-07-21 MED ORDER — BOOST / RESOURCE BREEZE PO LIQD: 1.0000 | Freq: Two times a day (BID) | ORAL | Status: DC

## 2015-07-21 NOTE — Progress Notes (Signed)
[place] [facility] Therapy Treatment Record Phone [phone]   Radiation Therapy was administered to Deborah Foster on: 07/21/2015  1:06 PM and was treatment # 1 out of a planned course of 10 treatments.  Radiation Treatment  1). Beam photons with 6-10 energy  2). Brachytherapy None  3). Stereotactic Radiosurgery None  4). Other Radiation None     Olivene Cookston, [place], Rad Therap

## 2015-07-21 NOTE — Progress Notes (Signed)
Patient discharged @ 1600 in stable condition.  Educated pt regarding new medications.  Prescription given.  Discharge instructions given in [place].  Teach back completed.

## 2015-07-21 NOTE — Progress Notes (Signed)
Nutrition Follow-up  DOCUMENTATION CODES:   Non-severe (moderate) malnutrition in context of acute illness/injury  INTERVENTION:  - Will order Boost Breeze BID, each supplement provides 250 kcal and 9 grams of protein - RD will continue to monitor for needs.  NUTRITION DIAGNOSIS:   Increased nutrient needs related to cancer and cancer related treatments, catabolic illness as evidenced by estimated needs. -ongoing  GOAL:   Patient will meet greater than or equal to 90% of their needs -minimally met at this time.   MONITOR:   PO intake, Supplement acceptance, Weight trends, Labs, I & O's  ASSESSMENT:   Patient has stage IV metastasized NSCLC (metastasized to brain, bone, adrenal gland). She had radiation therapy, which caused pneumonitis and hemoptysis. She is s/p IR embolization of RLL superior segment in 07/2014. She was hospitalized in May from 5/18 to 5/22 due to hemoptysis, which resolved with steroid treatment. She states that she continues to have hemoptysis recently. She was seen by Dr. Waunita Schooner on 6/21 and had a repeat bronchoscopy 07/06/2015.   7/6 No new weight since admission. Pt meets criteria for malnutrition as outlined above given mild fat and mild to moderate muscle wasting. Pt was also not fully meeting nutrition needs for several weeks PTA but unable to quantify amount of needs that were met/unmet. Per review, pt has been consuming 75-100% of meals on Regular diet since 7/4 and without overt issues.   Pt underwent right bronchial artery angio with empiric embolization on 7/3 afternoon. Despite this, pt continues with intermittent hemoptysis. Palliative Care has been consulted and saw pt 7/4 with plan to follow-up after oncology had seen pt, per pt request; will monitor for POC/GOC following Palliative Care being able to discuss with pt.   Per notes, plan to initiate palliative radiation to determine if this aids in controlling hemoptysis. Pt reports that emesis has been  becoming less frequent and decreased in amount produced. Pt reports last episode was yesterday evening.  Per Dr. Nena Alexander note today at 1043: I have spoken with Dr. Julien Nordmann, and then subsequently with radiation oncology-Alison Dara Lords PA-C-okay to discharge today post radiation therapy, with plans to continue radiation therapy as an outpatient and follow-up with Dr. Julien Nordmann as an outpatient. Patient also agreed with the plan. Please see discharge summary for further details.   Boost Breeze BID order placed by this RD prior to writing this note; will maintain order despite plan to d/c. Medications reviewed; 1 mg folic acid/day. Labs reviewed; CBGs: 87-99 mg/dL this AM, creatinine low, Ca: 7.5 mg/dL.  IVF: NS @ 40 mL/hr.   7/3 - Pt has been NPO since admission and unable to meet needs.  - Chart indicates pt is Vietnamese-speaking but interpreter was not needed for RD to have discussion with pt.  - Pt states that hemoptysis began yesterday with no associated nausea or abdominal pain.  - She states that vomiting happens after coughing.  - Pt states that due to this and procedure scheduled for today she has been unable to eat or drink.  - Spoke with RN who confirms procedure scheduled for later today.  - Pt states that PTA her appetite was poor due to taste alteration (lack of taste, bland taste) after beginning oral chemo regimen.  - Pt states that this medication is taken once/day at home.  - She denies experiencing any mouth pain with eating/drinking or throat pain with swallowing. - She does sometimes feel as though foods or drinks are getting stuck in her throat. - Pt  states that PTA she was losing weight but is unsure of time frame or amount of weight lost.  - She states CBW of 105 lbs.  - Chart review indicates that pt weighed 99 lbs 3 months ago and that current weight is 104 lbs.   Diet Order:  Diet regular Room service appropriate?: Yes; Fluid consistency:: Thin  Skin:  Reviewed, no  issues  Last BM:  7/5  Height:   Ht Readings from Last 1 Encounters:  07/18/15 5' (1.524 m)    Weight:   Wt Readings from Last 1 Encounters:  07/18/15 104 lb 6.4 oz (47.356 kg)    Ideal Body Weight:  45.45 kg (kg)  BMI:  Body mass index is 20.39 kg/(m^2).  Estimated Nutritional Needs:   Kcal:  1420-1660 (30-35 kcal/kg)  Protein:  70-85 grams  Fluid:  >/= 1.6 L/day  EDUCATION NEEDS:   No education needs identified at this time     Jarome Matin, MS, RD, LDN Inpatient Clinical Dietitian Pager # [phone] After hours/weekend pager # [phone]

## 2015-07-21 NOTE — Progress Notes (Signed)
Hemoptysis seems to have markedly improved-per patient she had only scant hemoptysis around 5 PM yesterday-and that was only episode yesterday. She has no further episodes of hemoptysis since then. She otherwise appears stable. I have spoken with Dr. Julien Nordmann, and then subsequently with radiation oncology-Alison Dara Lords PA-C-okay to discharge today post radiation therapy, with plans to continue radiation therapy as an outpatient and follow-up with Dr. Julien Nordmann as an outpatient. Patient also agreed with the plan. Please see discharge summary for further details.

## 2015-07-21 NOTE — Care Management Note (Signed)
Case Management Note  Patient Details  Name: Deborah Foster MRN: [id number] Date of Birth: September 03, 1970  Subjective/Objective:                  Hemoptysis Action/Plan: Discharge planning Expected Discharge Date:  07/21/15              Expected Discharge Plan:  Home/Self Care  In-House Referral:     Discharge planning Services  CM Consult  Post Acute Care Choice:    Choice offered to:     DME Arranged:    DME Agency:     HH Arranged:    [place] Agency:     Status of Service:  Completed, signed off  If discussed at [employer] of Stay Meetings, dates discussed:    Additional Comments: CM notes MD discharging pt and asks whether or not pt to have Palliative Consult (which hospitalist placed 7/4); MD states plan is for pt to continue with outpt radiation and PC not needed; CM relayed to Palliative [place].  No other CM needs were communicated. [name], RN 07/21/2015, 12:08 PM

## 2015-07-21 NOTE — Discharge Instructions (Signed)
Ung th? ph?i (Lung Cancer) Ung th? ph?i x?y ra khi cc t? bo b?t th??ng trong ph?i pht tri?n ngoi t?m ki?m sot v hnh thnh m?t kh?i (kh?i u). C m?t vi lo?i ung th? ph?i. Hai lo?i ph? bi?n nh?t l:  Khng t? bo nh?Rowe Robert lo?i ung th? ph?i ny, cc t? bo b?t th??ng l?n h?n v pht tri?n ch?m h?n so v?i ung th? ph?i t? bo nh?.  T? bo nh?Rowe Robert lo?i ung th? ph?i ny, cc t? bo b?t th??ng nh? h?n so v?i ung th? ph?i khng t? bo nh?Christel Mormon th? ph?i t? bo nh? tr? n?ng nhanh h?n so v?i ung th? ph?i khng t? bo nh?. NGUYN NHN  Nguyn nhn hng ??u d?n ??n ung th? ph?i l ht thu?c l. Nguyn nhn ??ng hng th? hai l ti?p xc v?i kh randon. CC Y?U T? NGUY C?  Ht thu?c l.  Ti?p xc v?i khi thu?c l th? ??ng.  Ti?p xc v?i kh randon.  Ti?p xc v?i ami?ng.  Ti?p xc v?i ch?t arsen trong n??c u?ng.   nhi?m khng kh.  Cassell Smiles ?nh ho?c c nhn c ti?n s? ung th? ph?i.  Li?u php phng x? ph?i.  Trn 67 tu?i. D?U HI?U V TRI?U CH?NG  Trong nh?ng giai ?o?n ??u, cc tri?u ch?ng c th? khng xu?t hi?n. Khi ung th? ti?n tri?n, cc tri?u ch?ng c th? bao g?m:  Ho ko di, c th? ra mu.  M?t m?i.  S?t cn khng r nguyn nhn.  Kh th?.  Th? kh kh.  ?au ng?c.  ?n khng ngon mi?ng. Cc tri?u ch?ng c?a ung th? giai ?o?n ti?n xa bao g?m:  Kh?n gi?ng.  ?au x??ng ho?c ?au kh?p.  Y?u.  Cc v?n ?? mng tay chn.  S?ng m?t ho?c tay.  Li?t m?t.  S?p m m?t. CH?N ?ON  Ung th? ph?i c th? ???c xc ??nh b?ng khm th?c th? v cc ki?m tra ch?ng h?n:  Ch?p X-quang ng?c.  Ch?p CT.  Xt nghi?m mu.  Sinh thi?t. Sau khi ch?n ?on, qu v? s? ph?i lm thm cc xt nghi?m ?? xc ??nh giai ?o?n ung th?. Cc giai ?o?n c?a ung th? ph?i khng t? bo nh? l:  Giai ?o?n 0, c?ng ???c g?i l ung th? t?i ch?. Trong giai ?o?n ny, cc t? bo b?t th??ng ???c tm th?y trong nim m?c bn trong c?a m?t ho?c hai bn ph?i.  Giai ?o?n I. Trong giai ?o?n ny, cc t? bo b?t  th??ng ? pht tri?n thnh u khng l?n h?n 5 cm. Ung th? ? ?n su h?n vo m ph?i nh?ng v?n ch?a lan vo cc h?ch b?ch huy?t ho?c cc b? ph?n khc c?a c? th?.  Giai ?o?n II. Trong giai ?o?n ny, kh?i u ? l?n 7 cm ho?c nh? h?n v ? lan ??n cc h?ch b?ch huy?t bn c?nh. Ho?c, kh?i u l?n 5 cm ho?c nh? h?n v ? xm l?n cc m xung quanh nh?ng khng c ? cc h?ch b?ch huy?t bn c?nh. C th? c nhi?u h?n m?t kh?i u.  Giai ?o?n III. Trong giai ?o?n ny, kh?i u c th? c b?t k? kch th??c no. C th? c nhi?u h?n m?t kh?i u trong ph?i. Cc t? bo ung th? ? lan ??n cc h?ch b?ch huy?t v c th? lan ??n cc c? quan khc.  Giai ?o?n IV. Trong giai ?o?n ny, c nh?ng kh?i u c ? c? hai bn ph?i  v ung th? ? lan ??n cc khu v?c khc c?a c? th?. Cc giai ?o?n c?a ung th? ph?i t? bo nh? l:  Giai ?o?n h?n ch?. Trong giai ?o?n ny, ung th? ???c tm th?y ? m?t bn ng?c.  Giai ?o?n m? r?ng. Trong giai ?o?n ny, ung th? ? c? hai bn ph?i v ? cc m pha bn kia c?a ng?c. Ung th? ? lan ??n cc c? quan khc ho?c ???c tm th?y trong ch?t d?ch gi?a cc l?p c?a l ph?i. ?I?U TR?  Ty thu?c vo lo?i v giai ?o?n ung th? ph?i, qu v? c th? ???c ?i?u tr? b?ng:  Ph?u thu?t. Ph?u thu?t ???c th?c hi?n ?? lo?i b? kh?i u.  X? tr?. ?i?u tr? ny ph h?y cc t? bo ung th? b?ng cch s? d?ng X quang ho?c cc lo?i phng x? khc.  Ha tr?. ?i?u tr? ny s? d?ng thu?c ?? ph h?y cc t? bo ung th?.  Li?u php nh?m trng ?ch. ?i?u tr? ny ch? nh?m ph h?y nh?ng t? bo ung th? ch? khng ph?i t?t c? cc t? bo nh? trong cc li?u php khc. Qu v? c?ng c th? ???c k?t h?p cc ph??ng php ?i?u tr?Marland Kitchen H??NG D?N CH?M [place] T?I NH   Khng s? d?ng b?t k? s?n ph?m thu?c l no. S?n ph?m thu?c l bao g?m thu?c l d?ng ht, thu?c l d?ng nhai v thu?c l ?i?n t?. N?u qu v? c?n gip ?? ?? cai thu?c, hy h?i chuyn gia ch?m [place] s?c kh?e.  Ch? s? d?ng thu?c theo ch? d?n c?a chuyn gia ch?m [place] s?c kh?e.  p d?ng ch? ?? ?n u?ng kh?e  m?nh. Lm vi?c v?i m?t chuyn gia dinh d??ng ?? b?o ??m qu v? c ?? dinh d??ng mnh c?n.  Cn nh?c tham gia m?t nhm h? tr? ho?c xin ???c t? v?n ?? gip qu v? ??i ph v?i c?ng th?ng c?a vi?c b? ung th? ph?i.  Hy cho chuyn gia v? ung th? (bc s? [place] ung th?) c?a qu v? bi?t n?u qu v? ???c nh?p vi?n.  Tun th? m?i cu?c h?n khm l?i theo ch? d?n c?a chuyn gia ch?m [place] s?c kh?e. ?i?u ny c vai tr quan tr?ng. ?I KHM N?U:   Qu v? st cn m khng c  ??nh gi?m cn.  Qu v? b? ho dai d?ng v th? kh kh.  Qu v? th?y kh th?.  Qu v? d? dng b? m?t m?i.  Qu v? b? ?au x??ng ho?c kh?p.  Qu v? b? kh nu?t.  Qu v? c?m th?y khn gi?ng ho?c nh?n th?y gi?ng qu v? thay ??i.  Thu?c gi?m ?au c?a qu v? khng c tc d?ng. NGAY L?P T?C ?I KHM N?U:   Qu v? ho ra mu.  Qu v? c nh?ng v?n ?? m?i v? th?.  Qu v? b? ?au ng?c.  Qu v? b? s?ng ?:  M?t ho?c c? hai m?t c chn ho?c chn.  M?t, c?, ho?c tay.  Qu v? b? l l?n.  Qu v? b? li?t ? m?t ho?c s?p m m?t.   Thng tin ny khng nh?m m?c ?ch thay th? cho l?i khuyn m chuyn gia ch?m [place] s?c kh?e ni v?i qu v?. Hy b?o ??m qu v? ph?i th?o lu?n b?t k? v?n ?? g m qu v? c v?i chuyn gia ch?m [place] s?c kh?e c?a qu v?.   Document Released: 01/01/2005 Document Revised: 01/22/2014 Elsevier Interactive Patient Education 2016 Elsevier  Inc. ° °

## 2015-07-21 NOTE — Discharge Summary (Signed)
PATIENT DETAILS Name: Deborah Foster Age: 45 y.o. Sex: female Date of Birth: [birth year] MRN: [id number]. Admitting Physician: Ivor Costa, MD CNO:[id number] [name], MD  Admit Date: 07/18/2015 Discharge date: 07/21/2015  Recommendations for Outpatient Follow-up:  1. Ensure follow-up with medical oncology and radiation oncology.  2. Please repeat CBC/BMET at next visit  PRIMARY DISCHARGE DIAGNOSIS:  Principal Problem:   Hemoptysis Active Problems:   Adrenal mass, left (HCC)   Brain metastases (HCC)   Palliative care encounter   Non-small cell carcinoma of lung, stage 4 (HCC)   Bone disease, metabolic   Radiation pneumonitis with hemoptysis      PAST MEDICAL HISTORY: Past Medical History  Diagnosis Date  . Pneumonia   . Hemoptysis   . Lung mass   . Hypokalemia   . Radiation 08/23/14-09/07/14    Brain/chest and left hip 30 Gy 12 Fx  . lung ca dx'd 07/2014  . Metastasis to brain ([place])   . Metastasis to adrenal gland ([place])   . Bone metastasis ([place])   . URI (upper respiratory infection) 02-02-2015    DISCHARGE MEDICATIONS: Current Discharge Medication List    START taking these medications   Details  albuterol (PROVENTIL HFA;VENTOLIN HFA) 108 (90 Base) MCG/ACT inhaler Inhale 2 puffs into the lungs every 6 (six) hours as needed for wheezing or shortness of breath. Qty: 1 Inhaler, Refills: 0    !! feeding supplement (BOOST / RESOURCE BREEZE) LIQD Take 1 Container by mouth 2 (two) times daily between meals. Qty: 60 Container, Refills: 0     !! - Potential duplicate medications found. Please discuss with provider.    CONTINUE these medications which have CHANGED   Details  benzonatate (TESSALON) 100 MG capsule Take 1 capsule (100 mg total) by mouth 3 (three) times daily as needed for cough. Qty: 30 capsule, Refills: 0    chlorpheniramine-HYDROcodone (TUSSIONEX) 10-8 MG/5ML SUER Take 5 mLs by mouth every 12 (twelve) hours as needed for cough. Qty: 140 mL, Refills: 0        CONTINUE these medications which have NOT CHANGED   Details  clindamycin (CLINDAGEL) 1 % gel Apply 1 application topically 2 (two) times daily.    Cyanocobalamin (VITAMIN B-12) 2500 MCG SUBL Place 2,500 mcg under the tongue daily.    erlotinib (TARCEVA) 100 MG tablet Take 1 tablet (100 mg total) by mouth daily. Take on an empty stomach 1 hour before meals or 2 hours after Qty: 30 tablet, Refills: 1   Associated Diagnoses: Non-small cell carcinoma of lung, stage 4, right (HCC)    ferrous sulfate 325 (65 FE) MG EC tablet Take 325 mg by mouth daily with breakfast.    folic acid (FOLVITE) 765 MCG tablet Take 800 mcg by mouth daily.    loratadine (CLARITIN) 10 MG tablet Take 10 mg by mouth daily.    mometasone-formoterol (DULERA) 200-5 MCG/ACT AERO Inhale 2 puffs into the lungs 2 (two) times daily as needed. Reported on 06/28/2015    !! Nutritional Supplements (JUICE PLUS FIBRE PO) Take 1 capsule by mouth daily. [employer] once daily [employer] once daily [employer] once daily    TURMERIC PO Take 1 capsule by mouth 2 (two) times daily. Turmeric Superior 750 mg po bid    vitamin B-12 1000 MCG tablet Take 1 tablet (1,000 mcg total) by mouth daily. Qty: 30 tablet, Refills: 0     !! - Potential duplicate medications found. Please discuss with provider.    STOP taking these medications  predniSONE (DELTASONE) 10 MG tablet         ALLERGIES:   Allergies  Allergen Reactions  . Other Other (See Comments)    Patient states that there is some she takes that makes her throat "cry" AND SCRATCHY   . Doxycycline Nausea And Vomiting    BRIEF HPI:  See H&P, Labs, Consult and Test reports for all details in brief, Patient is a 45 y.o. female with history of metastatic non-small cell cancer of the lung (completed radiation treatment on 08/2014, and currently on Tarceva daily and Xgeva monthly) admitted for hemoptysis.  CONSULTATIONS:   pulmonary/intensive care,  hematology/oncology and IR, Radiation Oncology  PERTINENT RADIOLOGIC STUDIES: Dg Chest 2 View  07/18/2015  CLINICAL DATA:  Acute onset of hemoptysis. Personal history of lung cancer. Initial encounter. EXAM: CHEST  2 VIEW COMPARISON:  Chest radiograph performed 06/20/2015, and CTA of the chest performed 06/02/2015 FINDINGS: Right perihilar airspace opacification is grossly unchanged in appearance. The left lung remains relatively clear. No pleural effusion or pneumothorax is seen. The heart is normal in size. No acute osseous abnormalities identified. IMPRESSION: Right perihilar airspace opacification again noted, stable in appearance, likely reflecting posttreatment change, though if the patient's hemoptysis persists, bronchoscopy could be considered for further evaluation, as suggested on prior CTA. Electronically Signed   By: Garald Balding M.D.   On: 07/18/2015 02:27   Ir Angiogram Pulmonary Right Selective  07/18/2015  INDICATION: 45 year old female with a history of hemoptysis. This is recurrent hemoptysis, with known non-small cell lung carcinoma and previous treatment with radiation therapy. She has been treated in July 2016 for hemoptysis. She has had recurrent bleeding with admission Saturday July 1st, and presents now for repeat angiogram and possible embolization. EXAM: RIGHT PULMONARY ARTERIOGRAPHY; IR ULTRASOUND GUIDANCE VASC ACCESS RIGHT; ADDITIONAL ARTERIOGRAPHY; IR EMBO ART VEN HEMORR LYMPH EXTRAV INC GUIDE ROADMAPPING MEDICATIONS: No additional ANESTHESIA/SEDATION: Moderate (conscious) sedation was employed during this procedure. A total of Versed 4.0 mg and Fentanyl 100 mcg was administered intravenously. Moderate Sedation Time: 68 minutes. The patient's level of consciousness and vital signs were monitored continuously by radiology nursing throughout the procedure under my direct supervision. CONTRAST:  100 cc FLUOROSCOPY TIME:  Fluoroscopy Time: 23 minutes 24 seconds (989 mGy).  COMPLICATIONS: None PROCEDURE: Informed consent was obtained from the patient following explanation of the procedure, risks, benefits and alternatives. The patient understands, agrees and consents for the procedure. All questions were addressed. A time out was performed prior to the initiation of the procedure. Maximal barrier sterile technique utilized including caps, mask, sterile gowns, sterile gloves, large sterile drape, hand hygiene, and Betadine prep. Ultrasound survey of the right inguinal region was performed with images stored and sent to PACs. A micropuncture needle was used access the right common femoral artery under ultrasound. With excellent arterial blood flow returned, and an .018 micro wire was passed through the needle, observed enter the abdominal aorta under fluoroscopy. The needle was removed, and a micropuncture sheath was placed over the wire. The inner dilator and wire were removed, and an 035 Bentson wire was advanced under fluoroscopy into the abdominal aorta. The sheath was removed and a standard 5 [place] vascular sheath was placed. The dilator was removed and the sheath was flushed. Mickelson catheter was advanced over the Bentson wire into the thoracic aorta. Double flush of the catheter was performed in the proximal descending thoracic aorta. Mickelson catheter was formed over the aortic arch, and the right bronchial artery was selected. Angiogram was  performed. Micro catheter arrangement with a high-flow Renegade and 014 Fathom wire was introduced. Micro catheter was advanced beyond the origin of the supreme intercostal artery. Repeat angiogram was performed. Embolization was then performed with 300 - 500 micro meter embosphere. Angiogram was not performed. Micro catheter was withdrawn slightly and a final embolization was performed with 500 -700 micro meter embosphere. Final angiogram performed. The micro catheter system was removed, and additional segmental artery interrogation was  performed. The thoracic 6 right segmental artery was interrogated. After initial angiogram, the micro catheter system was reintroduced, and an attempt at selecting hypertrophied early branch to the right lower lobe was performed. During the attempted catheterization, the artery was in spasm/dissection, and a final angiogram at this segmental vessel demonstrated decreased flow. Thoracic 7 right segmental artery was interrogated. Again, abnormal early tortuous branch was identified with small contribution to the medial region of the lower right lung. Thoracic 8 right segmental artery interrogated. Thoracic 9 right segmental artery interrogated. Thoracic 10 right segmental artery interrogated. Angiogram of the right common femoral artery was performed. Catheters and wires were removed and an Exoseal device was deployed for hemostasis. Patient tolerated the procedure well and remained hemodynamically stable throughout. No complications were encountered and no significant blood loss encountered. FINDINGS: Ultrasound survey of the right inguinal region demonstrates widely pacer right common femoral artery. Initial angiogram of the right bronchial artery at the thoracic 5 level demonstrates significant contribution to the right hilar region and right lower lung via very abnormal vasculature, which have abnormal tortuous anatomy and enlarging diameter as they continue distally from the bronchial artery. The right supreme intercostal artery arises from the apex of the bronchial artery. Status post embolization, there is nearly no flow to the hilar region and right lower lung via this right bronchial artery. Angiogram of thoracic 7 segmental artery demonstrates and early abnormal tortuous branch contributing to the infrahilar right lower lung. During attempted selection with the micro catheter system, the artery was spasm/dissected, with final angiogram demonstrating decreased flow to this territory with patency of the  segmental vessel maintained. Embolization was not performed at this level. Angiogram of thoracic 8 segmental artery demonstrate a similar early tortuous abnormal artery, with minimal contribution to the infrahilar region. Angiogram of thoracic 9 segmental artery demonstrates no significant contribution. Angiogram of thoracic 10 segmental artery demonstrates no significant contribution. IMPRESSION: Status post angiogram of right bronchial artery and 5 additional right-sided segmental arteries, with embosphere embolization of the right bronchial artery for hemostasis. Status post Exoseal deployment. Signed, Dulcy Fanny. Earleen Newport, DO [facility] Electronically Signed   By: Corrie Mckusick D.O.   On: 07/18/2015 18:04   Ir Angiogram Pulmonary Right Selective  07/18/2015  INDICATION: 45 year old female with a history of hemoptysis. This is recurrent hemoptysis, with known non-small cell lung carcinoma and previous treatment with radiation therapy. She has been treated in July 2016 for hemoptysis. She has had recurrent bleeding with admission Saturday July 1st, and presents now for repeat angiogram and possible embolization. EXAM: RIGHT PULMONARY ARTERIOGRAPHY; IR ULTRASOUND GUIDANCE VASC ACCESS RIGHT; ADDITIONAL ARTERIOGRAPHY; IR EMBO ART VEN HEMORR LYMPH EXTRAV INC GUIDE ROADMAPPING MEDICATIONS: No additional ANESTHESIA/SEDATION: Moderate (conscious) sedation was employed during this procedure. A total of Versed 4.0 mg and Fentanyl 100 mcg was administered intravenously. Moderate Sedation Time: 68 minutes. The patient's level of consciousness and vital signs were monitored continuously by radiology nursing throughout the procedure under my direct supervision. CONTRAST:  100 cc FLUOROSCOPY TIME:  Fluoroscopy Time: 23 minutes 24 seconds (989 mGy). COMPLICATIONS: None PROCEDURE: Informed consent was obtained from the patient following explanation of the procedure, risks,  benefits and alternatives. The patient understands, agrees and consents for the procedure. All questions were addressed. A time out was performed prior to the initiation of the procedure. Maximal barrier sterile technique utilized including caps, mask, sterile gowns, sterile gloves, large sterile drape, hand hygiene, and Betadine prep. Ultrasound survey of the right inguinal region was performed with images stored and sent to PACs. A micropuncture needle was used access the right common femoral artery under ultrasound. With excellent arterial blood flow returned, and an .018 micro wire was passed through the needle, observed enter the abdominal aorta under fluoroscopy. The needle was removed, and a micropuncture sheath was placed over the wire. The inner dilator and wire were removed, and an 035 Bentson wire was advanced under fluoroscopy into the abdominal aorta. The sheath was removed and a standard 5 [place] vascular sheath was placed. The dilator was removed and the sheath was flushed. Mickelson catheter was advanced over the Bentson wire into the thoracic aorta. Double flush of the catheter was performed in the proximal descending thoracic aorta. Mickelson catheter was formed over the aortic arch, and the right bronchial artery was selected. Angiogram was performed. Micro catheter arrangement with a high-flow Renegade and 014 Fathom wire was introduced. Micro catheter was advanced beyond the origin of the supreme intercostal artery. Repeat angiogram was performed. Embolization was then performed with 300 - 500 micro meter embosphere. Angiogram was not performed. Micro catheter was withdrawn slightly and a final embolization was performed with 500 -700 micro meter embosphere. Final angiogram performed. The micro catheter system was removed, and additional segmental artery interrogation was performed. The thoracic 6 right segmental artery was interrogated. After initial angiogram, the micro catheter system was  reintroduced, and an attempt at selecting hypertrophied early branch to the right lower lobe was performed. During the attempted catheterization, the artery was in spasm/dissection, and a final angiogram at this segmental vessel demonstrated decreased flow. Thoracic 7 right segmental artery was interrogated. Again, abnormal early tortuous branch was identified with small contribution to the medial region of the lower right lung. Thoracic 8 right segmental artery interrogated. Thoracic 9 right segmental artery interrogated. Thoracic 10 right segmental artery interrogated. Angiogram of the right common femoral artery was performed. Catheters and wires were removed and an Exoseal device was deployed for hemostasis. Patient tolerated the procedure well and remained hemodynamically stable throughout. No complications were encountered and no significant blood loss encountered. FINDINGS: Ultrasound survey of the right inguinal region demonstrates widely pacer right common femoral artery. Initial angiogram of the right bronchial artery at the thoracic 5 level demonstrates significant contribution to the right hilar region and right lower lung via very abnormal vasculature, which have abnormal tortuous anatomy and enlarging diameter as they continue distally from the bronchial artery. The right supreme intercostal artery arises from the apex of the bronchial artery. Status post embolization, there is nearly no flow to the hilar region and right lower lung via this right bronchial artery. Angiogram of thoracic 7 segmental artery demonstrates and early abnormal tortuous branch contributing to the infrahilar right lower lung. During attempted selection with the micro catheter system, the artery was spasm/dissected, with final angiogram demonstrating decreased flow to this territory with patency of the segmental vessel maintained. Embolization was not performed at this level. Angiogram of thoracic 8 segmental artery demonstrate  a similar early tortuous abnormal artery,  with minimal contribution to the infrahilar region. Angiogram of thoracic 9 segmental artery demonstrates no significant contribution. Angiogram of thoracic 10 segmental artery demonstrates no significant contribution. IMPRESSION: Status post angiogram of right bronchial artery and 5 additional right-sided segmental arteries, with embosphere embolization of the right bronchial artery for hemostasis. Status post Exoseal deployment. Signed, Dulcy Fanny. Earleen Newport, DO [facility] Electronically Signed   By: Corrie Mckusick D.O.   On: 07/18/2015 18:04   Ir Angiogram Pulmonary Right Selective  07/18/2015  INDICATION: 45 year old female with a history of hemoptysis. This is recurrent hemoptysis, with known non-small cell lung carcinoma and previous treatment with radiation therapy. She has been treated in July 2016 for hemoptysis. She has had recurrent bleeding with admission Saturday July 1st, and presents now for repeat angiogram and possible embolization. EXAM: RIGHT PULMONARY ARTERIOGRAPHY; IR ULTRASOUND GUIDANCE VASC ACCESS RIGHT; ADDITIONAL ARTERIOGRAPHY; IR EMBO ART VEN HEMORR LYMPH EXTRAV INC GUIDE ROADMAPPING MEDICATIONS: No additional ANESTHESIA/SEDATION: Moderate (conscious) sedation was employed during this procedure. A total of Versed 4.0 mg and Fentanyl 100 mcg was administered intravenously. Moderate Sedation Time: 68 minutes. The patient's level of consciousness and vital signs were monitored continuously by radiology nursing throughout the procedure under my direct supervision. CONTRAST:  100 cc FLUOROSCOPY TIME:  Fluoroscopy Time: 23 minutes 24 seconds (989 mGy). COMPLICATIONS: None PROCEDURE: Informed consent was obtained from the patient following explanation of the procedure, risks, benefits and alternatives. The patient understands, agrees and consents for the procedure. All questions were addressed. A time  out was performed prior to the initiation of the procedure. Maximal barrier sterile technique utilized including caps, mask, sterile gowns, sterile gloves, large sterile drape, hand hygiene, and Betadine prep. Ultrasound survey of the right inguinal region was performed with images stored and sent to PACs. A micropuncture needle was used access the right common femoral artery under ultrasound. With excellent arterial blood flow returned, and an .018 micro wire was passed through the needle, observed enter the abdominal aorta under fluoroscopy. The needle was removed, and a micropuncture sheath was placed over the wire. The inner dilator and wire were removed, and an 035 Bentson wire was advanced under fluoroscopy into the abdominal aorta. The sheath was removed and a standard 5 [place] vascular sheath was placed. The dilator was removed and the sheath was flushed. Mickelson catheter was advanced over the Bentson wire into the thoracic aorta. Double flush of the catheter was performed in the proximal descending thoracic aorta. Mickelson catheter was formed over the aortic arch, and the right bronchial artery was selected. Angiogram was performed. Micro catheter arrangement with a high-flow Renegade and 014 Fathom wire was introduced. Micro catheter was advanced beyond the origin of the supreme intercostal artery. Repeat angiogram was performed. Embolization was then performed with 300 - 500 micro meter embosphere. Angiogram was not performed. Micro catheter was withdrawn slightly and a final embolization was performed with 500 -700 micro meter embosphere. Final angiogram performed. The micro catheter system was removed, and additional segmental artery interrogation was performed. The thoracic 6 right segmental artery was interrogated. After initial angiogram, the micro catheter system was reintroduced, and an attempt at selecting hypertrophied early branch to the right lower lobe was performed. During the attempted  catheterization, the artery was in spasm/dissection, and a final angiogram at this segmental vessel demonstrated decreased flow. Thoracic 7 right segmental artery was interrogated. Again, abnormal early tortuous branch was identified with small contribution to the medial region of the lower  right lung. Thoracic 8 right segmental artery interrogated. Thoracic 9 right segmental artery interrogated. Thoracic 10 right segmental artery interrogated. Angiogram of the right common femoral artery was performed. Catheters and wires were removed and an Exoseal device was deployed for hemostasis. Patient tolerated the procedure well and remained hemodynamically stable throughout. No complications were encountered and no significant blood loss encountered. FINDINGS: Ultrasound survey of the right inguinal region demonstrates widely pacer right common femoral artery. Initial angiogram of the right bronchial artery at the thoracic 5 level demonstrates significant contribution to the right hilar region and right lower lung via very abnormal vasculature, which have abnormal tortuous anatomy and enlarging diameter as they continue distally from the bronchial artery. The right supreme intercostal artery arises from the apex of the bronchial artery. Status post embolization, there is nearly no flow to the hilar region and right lower lung via this right bronchial artery. Angiogram of thoracic 7 segmental artery demonstrates and early abnormal tortuous branch contributing to the infrahilar right lower lung. During attempted selection with the micro catheter system, the artery was spasm/dissected, with final angiogram demonstrating decreased flow to this territory with patency of the segmental vessel maintained. Embolization was not performed at this level. Angiogram of thoracic 8 segmental artery demonstrate a similar early tortuous abnormal artery, with minimal contribution to the infrahilar region. Angiogram of thoracic 9 segmental  artery demonstrates no significant contribution. Angiogram of thoracic 10 segmental artery demonstrates no significant contribution. IMPRESSION: Status post angiogram of right bronchial artery and 5 additional right-sided segmental arteries, with embosphere embolization of the right bronchial artery for hemostasis. Status post Exoseal deployment. Signed, Dulcy Fanny. Earleen Newport, DO [facility] Electronically Signed   By: Corrie Mckusick D.O.   On: 07/18/2015 18:04   Ir Angiogram Pulmonary Right Selective  07/18/2015  INDICATION: 45 year old female with a history of hemoptysis. This is recurrent hemoptysis, with known non-small cell lung carcinoma and previous treatment with radiation therapy. She has been treated in July 2016 for hemoptysis. She has had recurrent bleeding with admission Saturday July 1st, and presents now for repeat angiogram and possible embolization. EXAM: RIGHT PULMONARY ARTERIOGRAPHY; IR ULTRASOUND GUIDANCE VASC ACCESS RIGHT; ADDITIONAL ARTERIOGRAPHY; IR EMBO ART VEN HEMORR LYMPH EXTRAV INC GUIDE ROADMAPPING MEDICATIONS: No additional ANESTHESIA/SEDATION: Moderate (conscious) sedation was employed during this procedure. A total of Versed 4.0 mg and Fentanyl 100 mcg was administered intravenously. Moderate Sedation Time: 68 minutes. The patient's level of consciousness and vital signs were monitored continuously by radiology nursing throughout the procedure under my direct supervision. CONTRAST:  100 cc FLUOROSCOPY TIME:  Fluoroscopy Time: 23 minutes 24 seconds (989 mGy). COMPLICATIONS: None PROCEDURE: Informed consent was obtained from the patient following explanation of the procedure, risks, benefits and alternatives. The patient understands, agrees and consents for the procedure. All questions were addressed. A time out was performed prior to the initiation of the procedure. Maximal barrier sterile technique utilized including caps, mask,  sterile gowns, sterile gloves, large sterile drape, hand hygiene, and Betadine prep. Ultrasound survey of the right inguinal region was performed with images stored and sent to PACs. A micropuncture needle was used access the right common femoral artery under ultrasound. With excellent arterial blood flow returned, and an .018 micro wire was passed through the needle, observed enter the abdominal aorta under fluoroscopy. The needle was removed, and a micropuncture sheath was placed over the wire. The inner dilator and wire were removed, and an 035 Bentson wire was advanced under fluoroscopy  into the abdominal aorta. The sheath was removed and a standard 5 [place] vascular sheath was placed. The dilator was removed and the sheath was flushed. Mickelson catheter was advanced over the Bentson wire into the thoracic aorta. Double flush of the catheter was performed in the proximal descending thoracic aorta. Mickelson catheter was formed over the aortic arch, and the right bronchial artery was selected. Angiogram was performed. Micro catheter arrangement with a high-flow Renegade and 014 Fathom wire was introduced. Micro catheter was advanced beyond the origin of the supreme intercostal artery. Repeat angiogram was performed. Embolization was then performed with 300 - 500 micro meter embosphere. Angiogram was not performed. Micro catheter was withdrawn slightly and a final embolization was performed with 500 -700 micro meter embosphere. Final angiogram performed. The micro catheter system was removed, and additional segmental artery interrogation was performed. The thoracic 6 right segmental artery was interrogated. After initial angiogram, the micro catheter system was reintroduced, and an attempt at selecting hypertrophied early branch to the right lower lobe was performed. During the attempted catheterization, the artery was in spasm/dissection, and a final angiogram at this segmental vessel demonstrated decreased  flow. Thoracic 7 right segmental artery was interrogated. Again, abnormal early tortuous branch was identified with small contribution to the medial region of the lower right lung. Thoracic 8 right segmental artery interrogated. Thoracic 9 right segmental artery interrogated. Thoracic 10 right segmental artery interrogated. Angiogram of the right common femoral artery was performed. Catheters and wires were removed and an Exoseal device was deployed for hemostasis. Patient tolerated the procedure well and remained hemodynamically stable throughout. No complications were encountered and no significant blood loss encountered. FINDINGS: Ultrasound survey of the right inguinal region demonstrates widely pacer right common femoral artery. Initial angiogram of the right bronchial artery at the thoracic 5 level demonstrates significant contribution to the right hilar region and right lower lung via very abnormal vasculature, which have abnormal tortuous anatomy and enlarging diameter as they continue distally from the bronchial artery. The right supreme intercostal artery arises from the apex of the bronchial artery. Status post embolization, there is nearly no flow to the hilar region and right lower lung via this right bronchial artery. Angiogram of thoracic 7 segmental artery demonstrates and early abnormal tortuous branch contributing to the infrahilar right lower lung. During attempted selection with the micro catheter system, the artery was spasm/dissected, with final angiogram demonstrating decreased flow to this territory with patency of the segmental vessel maintained. Embolization was not performed at this level. Angiogram of thoracic 8 segmental artery demonstrate a similar early tortuous abnormal artery, with minimal contribution to the infrahilar region. Angiogram of thoracic 9 segmental artery demonstrates no significant contribution. Angiogram of thoracic 10 segmental artery demonstrates no significant  contribution. IMPRESSION: Status post angiogram of right bronchial artery and 5 additional right-sided segmental arteries, with embosphere embolization of the right bronchial artery for hemostasis. Status post Exoseal deployment. Signed, Dulcy Fanny. Earleen Newport, DO [facility] Radiology Electronically Signed   By: Corrie Mckusick D.O.   On: 07/18/2015 18:04   Ir Angiogram Pulmonary Right Selective  07/18/2015  INDICATION: 45 year old female with a history of hemoptysis. This is recurrent hemoptysis, with known non-small cell lung carcinoma and previous treatment with radiation therapy. She has been treated in July 2016 for hemoptysis. She has had recurrent bleeding with admission Saturday July 1st, and presents now for repeat angiogram and possible embolization. EXAM: RIGHT PULMONARY ARTERIOGRAPHY; IR ULTRASOUND GUIDANCE VASC ACCESS RIGHT; ADDITIONAL  ARTERIOGRAPHY; IR EMBO ART VEN HEMORR LYMPH EXTRAV INC GUIDE ROADMAPPING MEDICATIONS: No additional ANESTHESIA/SEDATION: Moderate (conscious) sedation was employed during this procedure. A total of Versed 4.0 mg and Fentanyl 100 mcg was administered intravenously. Moderate Sedation Time: 68 minutes. The patient's level of consciousness and vital signs were monitored continuously by radiology nursing throughout the procedure under my direct supervision. CONTRAST:  100 cc FLUOROSCOPY TIME:  Fluoroscopy Time: 23 minutes 24 seconds (989 mGy). COMPLICATIONS: None PROCEDURE: Informed consent was obtained from the patient following explanation of the procedure, risks, benefits and alternatives. The patient understands, agrees and consents for the procedure. All questions were addressed. A time out was performed prior to the initiation of the procedure. Maximal barrier sterile technique utilized including caps, mask, sterile gowns, sterile gloves, large sterile drape, hand hygiene, and Betadine prep. Ultrasound survey of the right  inguinal region was performed with images stored and sent to PACs. A micropuncture needle was used access the right common femoral artery under ultrasound. With excellent arterial blood flow returned, and an .018 micro wire was passed through the needle, observed enter the abdominal aorta under fluoroscopy. The needle was removed, and a micropuncture sheath was placed over the wire. The inner dilator and wire were removed, and an 035 Bentson wire was advanced under fluoroscopy into the abdominal aorta. The sheath was removed and a standard 5 [place] vascular sheath was placed. The dilator was removed and the sheath was flushed. Mickelson catheter was advanced over the Bentson wire into the thoracic aorta. Double flush of the catheter was performed in the proximal descending thoracic aorta. Mickelson catheter was formed over the aortic arch, and the right bronchial artery was selected. Angiogram was performed. Micro catheter arrangement with a high-flow Renegade and 014 Fathom wire was introduced. Micro catheter was advanced beyond the origin of the supreme intercostal artery. Repeat angiogram was performed. Embolization was then performed with 300 - 500 micro meter embosphere. Angiogram was not performed. Micro catheter was withdrawn slightly and a final embolization was performed with 500 -700 micro meter embosphere. Final angiogram performed. The micro catheter system was removed, and additional segmental artery interrogation was performed. The thoracic 6 right segmental artery was interrogated. After initial angiogram, the micro catheter system was reintroduced, and an attempt at selecting hypertrophied early branch to the right lower lobe was performed. During the attempted catheterization, the artery was in spasm/dissection, and a final angiogram at this segmental vessel demonstrated decreased flow. Thoracic 7 right segmental artery was interrogated. Again, abnormal early tortuous branch was identified with  small contribution to the medial region of the lower right lung. Thoracic 8 right segmental artery interrogated. Thoracic 9 right segmental artery interrogated. Thoracic 10 right segmental artery interrogated. Angiogram of the right common femoral artery was performed. Catheters and wires were removed and an Exoseal device was deployed for hemostasis. Patient tolerated the procedure well and remained hemodynamically stable throughout. No complications were encountered and no significant blood loss encountered. FINDINGS: Ultrasound survey of the right inguinal region demonstrates widely pacer right common femoral artery. Initial angiogram of the right bronchial artery at the thoracic 5 level demonstrates significant contribution to the right hilar region and right lower lung via very abnormal vasculature, which have abnormal tortuous anatomy and enlarging diameter as they continue distally from the bronchial artery. The right supreme intercostal artery arises from the apex of the bronchial artery. Status post embolization, there is nearly no flow to the hilar region and right lower lung via this right bronchial  artery. Angiogram of thoracic 7 segmental artery demonstrates and early abnormal tortuous branch contributing to the infrahilar right lower lung. During attempted selection with the micro catheter system, the artery was spasm/dissected, with final angiogram demonstrating decreased flow to this territory with patency of the segmental vessel maintained. Embolization was not performed at this level. Angiogram of thoracic 8 segmental artery demonstrate a similar early tortuous abnormal artery, with minimal contribution to the infrahilar region. Angiogram of thoracic 9 segmental artery demonstrates no significant contribution. Angiogram of thoracic 10 segmental artery demonstrates no significant contribution. IMPRESSION: Status post angiogram of right bronchial artery and 5 additional right-sided segmental  arteries, with embosphere embolization of the right bronchial artery for hemostasis. Status post Exoseal deployment. Signed, Dulcy Fanny. Earleen Newport, DO [facility] Radiology Electronically Signed   By: Corrie Mckusick D.O.   On: 07/18/2015 18:04   Ir Angiogram Pulmonary Right Selective  07/18/2015  INDICATION: 45 year old female with a history of hemoptysis. This is recurrent hemoptysis, with known non-small cell lung carcinoma and previous treatment with radiation therapy. She has been treated in July 2016 for hemoptysis. She has had recurrent bleeding with admission Saturday July 1st, and presents now for repeat angiogram and possible embolization. EXAM: RIGHT PULMONARY ARTERIOGRAPHY; IR ULTRASOUND GUIDANCE VASC ACCESS RIGHT; ADDITIONAL ARTERIOGRAPHY; IR EMBO ART VEN HEMORR LYMPH EXTRAV INC GUIDE ROADMAPPING MEDICATIONS: No additional ANESTHESIA/SEDATION: Moderate (conscious) sedation was employed during this procedure. A total of Versed 4.0 mg and Fentanyl 100 mcg was administered intravenously. Moderate Sedation Time: 68 minutes. The patient's level of consciousness and vital signs were monitored continuously by radiology nursing throughout the procedure under my direct supervision. CONTRAST:  100 cc FLUOROSCOPY TIME:  Fluoroscopy Time: 23 minutes 24 seconds (989 mGy). COMPLICATIONS: None PROCEDURE: Informed consent was obtained from the patient following explanation of the procedure, risks, benefits and alternatives. The patient understands, agrees and consents for the procedure. All questions were addressed. A time out was performed prior to the initiation of the procedure. Maximal barrier sterile technique utilized including caps, mask, sterile gowns, sterile gloves, large sterile drape, hand hygiene, and Betadine prep. Ultrasound survey of the right inguinal region was performed with images stored and sent to PACs. A micropuncture needle was used access the right  common femoral artery under ultrasound. With excellent arterial blood flow returned, and an .018 micro wire was passed through the needle, observed enter the abdominal aorta under fluoroscopy. The needle was removed, and a micropuncture sheath was placed over the wire. The inner dilator and wire were removed, and an 035 Bentson wire was advanced under fluoroscopy into the abdominal aorta. The sheath was removed and a standard 5 [place] vascular sheath was placed. The dilator was removed and the sheath was flushed. Mickelson catheter was advanced over the Bentson wire into the thoracic aorta. Double flush of the catheter was performed in the proximal descending thoracic aorta. Mickelson catheter was formed over the aortic arch, and the right bronchial artery was selected. Angiogram was performed. Micro catheter arrangement with a high-flow Renegade and 014 Fathom wire was introduced. Micro catheter was advanced beyond the origin of the supreme intercostal artery. Repeat angiogram was performed. Embolization was then performed with 300 - 500 micro meter embosphere. Angiogram was not performed. Micro catheter was withdrawn slightly and a final embolization was performed with 500 -700 micro meter embosphere. Final angiogram performed. The micro catheter system was removed, and additional segmental artery interrogation was performed. The thoracic 6 right segmental artery was interrogated.  After initial angiogram, the micro catheter system was reintroduced, and an attempt at selecting hypertrophied early branch to the right lower lobe was performed. During the attempted catheterization, the artery was in spasm/dissection, and a final angiogram at this segmental vessel demonstrated decreased flow. Thoracic 7 right segmental artery was interrogated. Again, abnormal early tortuous branch was identified with small contribution to the medial region of the lower right lung. Thoracic 8 right segmental artery interrogated.  Thoracic 9 right segmental artery interrogated. Thoracic 10 right segmental artery interrogated. Angiogram of the right common femoral artery was performed. Catheters and wires were removed and an Exoseal device was deployed for hemostasis. Patient tolerated the procedure well and remained hemodynamically stable throughout. No complications were encountered and no significant blood loss encountered. FINDINGS: Ultrasound survey of the right inguinal region demonstrates widely pacer right common femoral artery. Initial angiogram of the right bronchial artery at the thoracic 5 level demonstrates significant contribution to the right hilar region and right lower lung via very abnormal vasculature, which have abnormal tortuous anatomy and enlarging diameter as they continue distally from the bronchial artery. The right supreme intercostal artery arises from the apex of the bronchial artery. Status post embolization, there is nearly no flow to the hilar region and right lower lung via this right bronchial artery. Angiogram of thoracic 7 segmental artery demonstrates and early abnormal tortuous branch contributing to the infrahilar right lower lung. During attempted selection with the micro catheter system, the artery was spasm/dissected, with final angiogram demonstrating decreased flow to this territory with patency of the segmental vessel maintained. Embolization was not performed at this level. Angiogram of thoracic 8 segmental artery demonstrate a similar early tortuous abnormal artery, with minimal contribution to the infrahilar region. Angiogram of thoracic 9 segmental artery demonstrates no significant contribution. Angiogram of thoracic 10 segmental artery demonstrates no significant contribution. IMPRESSION: Status post angiogram of right bronchial artery and 5 additional right-sided segmental arteries, with embosphere embolization of the right bronchial artery for hemostasis. Status post Exoseal deployment.  Signed, Dulcy Fanny. Earleen Newport, DO [facility] Radiology Electronically Signed   By: Corrie Mckusick D.O.   On: 07/18/2015 18:04   Ir Angiogram Selective Each Additional Vessel  07/18/2015  INDICATION: 45 year old female with a history of hemoptysis. This is recurrent hemoptysis, with known non-small cell lung carcinoma and previous treatment with radiation therapy. She has been treated in July 2016 for hemoptysis. She has had recurrent bleeding with admission Saturday July 1st, and presents now for repeat angiogram and possible embolization. EXAM: RIGHT PULMONARY ARTERIOGRAPHY; IR ULTRASOUND GUIDANCE VASC ACCESS RIGHT; ADDITIONAL ARTERIOGRAPHY; IR EMBO ART VEN HEMORR LYMPH EXTRAV INC GUIDE ROADMAPPING MEDICATIONS: No additional ANESTHESIA/SEDATION: Moderate (conscious) sedation was employed during this procedure. A total of Versed 4.0 mg and Fentanyl 100 mcg was administered intravenously. Moderate Sedation Time: 68 minutes. The patient's level of consciousness and vital signs were monitored continuously by radiology nursing throughout the procedure under my direct supervision. CONTRAST:  100 cc FLUOROSCOPY TIME:  Fluoroscopy Time: 23 minutes 24 seconds (989 mGy). COMPLICATIONS: None PROCEDURE: Informed consent was obtained from the patient following explanation of the procedure, risks, benefits and alternatives. The patient understands, agrees and consents for the procedure. All questions were addressed. A time out was performed prior to the initiation of the procedure. Maximal barrier sterile technique utilized including caps, mask, sterile gowns, sterile gloves, large sterile drape, hand hygiene, and Betadine prep. Ultrasound survey of the right inguinal region was performed with images stored  and sent to PACs. A micropuncture needle was used access the right common femoral artery under ultrasound. With excellent arterial blood flow returned, and an .018 micro wire was  passed through the needle, observed enter the abdominal aorta under fluoroscopy. The needle was removed, and a micropuncture sheath was placed over the wire. The inner dilator and wire were removed, and an 035 Bentson wire was advanced under fluoroscopy into the abdominal aorta. The sheath was removed and a standard 5 [place] vascular sheath was placed. The dilator was removed and the sheath was flushed. Mickelson catheter was advanced over the Bentson wire into the thoracic aorta. Double flush of the catheter was performed in the proximal descending thoracic aorta. Mickelson catheter was formed over the aortic arch, and the right bronchial artery was selected. Angiogram was performed. Micro catheter arrangement with a high-flow Renegade and 014 Fathom wire was introduced. Micro catheter was advanced beyond the origin of the supreme intercostal artery. Repeat angiogram was performed. Embolization was then performed with 300 - 500 micro meter embosphere. Angiogram was not performed. Micro catheter was withdrawn slightly and a final embolization was performed with 500 -700 micro meter embosphere. Final angiogram performed. The micro catheter system was removed, and additional segmental artery interrogation was performed. The thoracic 6 right segmental artery was interrogated. After initial angiogram, the micro catheter system was reintroduced, and an attempt at selecting hypertrophied early branch to the right lower lobe was performed. During the attempted catheterization, the artery was in spasm/dissection, and a final angiogram at this segmental vessel demonstrated decreased flow. Thoracic 7 right segmental artery was interrogated. Again, abnormal early tortuous branch was identified with small contribution to the medial region of the lower right lung. Thoracic 8 right segmental artery interrogated. Thoracic 9 right segmental artery interrogated. Thoracic 10 right segmental artery interrogated. Angiogram of the right  common femoral artery was performed. Catheters and wires were removed and an Exoseal device was deployed for hemostasis. Patient tolerated the procedure well and remained hemodynamically stable throughout. No complications were encountered and no significant blood loss encountered. FINDINGS: Ultrasound survey of the right inguinal region demonstrates widely pacer right common femoral artery. Initial angiogram of the right bronchial artery at the thoracic 5 level demonstrates significant contribution to the right hilar region and right lower lung via very abnormal vasculature, which have abnormal tortuous anatomy and enlarging diameter as they continue distally from the bronchial artery. The right supreme intercostal artery arises from the apex of the bronchial artery. Status post embolization, there is nearly no flow to the hilar region and right lower lung via this right bronchial artery. Angiogram of thoracic 7 segmental artery demonstrates and early abnormal tortuous branch contributing to the infrahilar right lower lung. During attempted selection with the micro catheter system, the artery was spasm/dissected, with final angiogram demonstrating decreased flow to this territory with patency of the segmental vessel maintained. Embolization was not performed at this level. Angiogram of thoracic 8 segmental artery demonstrate a similar early tortuous abnormal artery, with minimal contribution to the infrahilar region. Angiogram of thoracic 9 segmental artery demonstrates no significant contribution. Angiogram of thoracic 10 segmental artery demonstrates no significant contribution. IMPRESSION: Status post angiogram of right bronchial artery and 5 additional right-sided segmental arteries, with embosphere embolization of the right bronchial artery for hemostasis. Status post Exoseal deployment. Signed, Dulcy Fanny. Earleen Newport, DO [facility] Radiology Electronically Signed    By: Corrie Mckusick D.O.   On: 07/18/2015 18:04   Dg  Esophagus  07/06/2015  CLINICAL DATA:  Lung cancer undergoing radiation therapy. Odynophagia EXAM: ESOPHOGRAM / BARIUM SWALLOW / BARIUM TABLET STUDY TECHNIQUE: Combined double contrast and single contrast examination performed using effervescent crystals, thick barium liquid, and thin barium liquid. The patient was observed with fluoroscopy swallowing a 13 mm barium sulphate tablet. FLUOROSCOPY TIME:  Radiation Exposure Index (as provided by the fluoroscopic device): 9.4 mGy If the device does not provide the exposure index: Fluoroscopy Time:  dictate in minutes and seconds Number of Acquired Images: COMPARISON:  CT chest 06/02/2015 FINDINGS: Normal swallowing function in the high cervical esophagus. No mucosal irregularity within the thoracic esophagus or distal esophagus. No stricture mass within the esophagus. GE junction is normal With patient in prone position, there is poor initiation of the primary stripping wave. Contrast remained in the proximal esophagus with to and fro motion and to the high cervical esophagus and hypopharynx. No tertiary contractions. 13 mm barium tablet passed the GE junction easily. IMPRESSION: 1. Esophageal dysmotility with poor initiation of the primary stripping wave and to and fro motion of barium column in the high cervical esophagus. No tertiary contractions. 2. No mucosal irregularity stricture mass in esophagus. 3. Barium tablet passed GE junction easily. Electronically Signed   By: Suzy Bouchard M.D.   On: 07/06/2015 10:40   Ir US Guide Vasc Access Right  07/18/2015  INDICATION: 45 year old female with a history of hemoptysis. This is recurrent hemoptysis, with known non-small cell lung carcinoma and previous treatment with radiation therapy. She has been treated in July 2016 for hemoptysis. She has had recurrent bleeding with admission Saturday July 1st, and presents now for repeat angiogram and possible embolization.  EXAM: RIGHT PULMONARY ARTERIOGRAPHY; IR ULTRASOUND GUIDANCE VASC ACCESS RIGHT; ADDITIONAL ARTERIOGRAPHY; IR EMBO ART VEN HEMORR LYMPH EXTRAV INC GUIDE ROADMAPPING MEDICATIONS: No additional ANESTHESIA/SEDATION: Moderate (conscious) sedation was employed during this procedure. A total of Versed 4.0 mg and Fentanyl 100 mcg was administered intravenously. Moderate Sedation Time: 68 minutes. The patient's level of consciousness and vital signs were monitored continuously by radiology nursing throughout the procedure under my direct supervision. CONTRAST:  100 cc FLUOROSCOPY TIME:  Fluoroscopy Time: 23 minutes 24 seconds (989 mGy). COMPLICATIONS: None PROCEDURE: Informed consent was obtained from the patient following explanation of the procedure, risks, benefits and alternatives. The patient understands, agrees and consents for the procedure. All questions were addressed. A time out was performed prior to the initiation of the procedure. Maximal barrier sterile technique utilized including caps, mask, sterile gowns, sterile gloves, large sterile drape, hand hygiene, and Betadine prep. Ultrasound survey of the right inguinal region was performed with images stored and sent to PACs. A micropuncture needle was used access the right common femoral artery under ultrasound. With excellent arterial blood flow returned, and an .018 micro wire was passed through the needle, observed enter the abdominal aorta under fluoroscopy. The needle was removed, and a micropuncture sheath was placed over the wire. The inner dilator and wire were removed, and an 035 Bentson wire was advanced under fluoroscopy into the abdominal aorta. The sheath was removed and a standard 5 [place] vascular sheath was placed. The dilator was removed and the sheath was flushed. Mickelson catheter was advanced over the Bentson wire into the thoracic aorta. Double flush of the catheter was performed in the proximal descending thoracic aorta. Mickelson catheter  was formed over the aortic arch, and the right bronchial artery was selected. Angiogram was performed. Micro catheter arrangement with a high-flow Renegade and  014 Fathom wire was introduced. Micro catheter was advanced beyond the origin of the supreme intercostal artery. Repeat angiogram was performed. Embolization was then performed with 300 - 500 micro meter embosphere. Angiogram was not performed. Micro catheter was withdrawn slightly and a final embolization was performed with 500 -700 micro meter embosphere. Final angiogram performed. The micro catheter system was removed, and additional segmental artery interrogation was performed. The thoracic 6 right segmental artery was interrogated. After initial angiogram, the micro catheter system was reintroduced, and an attempt at selecting hypertrophied early branch to the right lower lobe was performed. During the attempted catheterization, the artery was in spasm/dissection, and a final angiogram at this segmental vessel demonstrated decreased flow. Thoracic 7 right segmental artery was interrogated. Again, abnormal early tortuous branch was identified with small contribution to the medial region of the lower right lung. Thoracic 8 right segmental artery interrogated. Thoracic 9 right segmental artery interrogated. Thoracic 10 right segmental artery interrogated. Angiogram of the right common femoral artery was performed. Catheters and wires were removed and an Exoseal device was deployed for hemostasis. Patient tolerated the procedure well and remained hemodynamically stable throughout. No complications were encountered and no significant blood loss encountered. FINDINGS: Ultrasound survey of the right inguinal region demonstrates widely pacer right common femoral artery. Initial angiogram of the right bronchial artery at the thoracic 5 level demonstrates significant contribution to the right hilar region and right lower lung via very abnormal vasculature, which  have abnormal tortuous anatomy and enlarging diameter as they continue distally from the bronchial artery. The right supreme intercostal artery arises from the apex of the bronchial artery. Status post embolization, there is nearly no flow to the hilar region and right lower lung via this right bronchial artery. Angiogram of thoracic 7 segmental artery demonstrates and early abnormal tortuous branch contributing to the infrahilar right lower lung. During attempted selection with the micro catheter system, the artery was spasm/dissected, with final angiogram demonstrating decreased flow to this territory with patency of the segmental vessel maintained. Embolization was not performed at this level. Angiogram of thoracic 8 segmental artery demonstrate a similar early tortuous abnormal artery, with minimal contribution to the infrahilar region. Angiogram of thoracic 9 segmental artery demonstrates no significant contribution. Angiogram of thoracic 10 segmental artery demonstrates no significant contribution. IMPRESSION: Status post angiogram of right bronchial artery and 5 additional right-sided segmental arteries, with embosphere embolization of the right bronchial artery for hemostasis. Status post Exoseal deployment. Signed, Dulcy Fanny. Earleen Newport, DO [facility] Radiology Electronically Signed   By: Corrie Mckusick D.O.   On: 07/18/2015 18:04   [place] Guide Roadmapping  07/18/2015  INDICATION: 45 year old female with a history of hemoptysis. This is recurrent hemoptysis, with known non-small cell lung carcinoma and previous treatment with radiation therapy. She has been treated in July 2016 for hemoptysis. She has had recurrent bleeding with admission Saturday July 1st, and presents now for repeat angiogram and possible embolization. EXAM: RIGHT PULMONARY ARTERIOGRAPHY; IR ULTRASOUND GUIDANCE VASC ACCESS RIGHT; ADDITIONAL ARTERIOGRAPHY; IR  EMBO ART VEN HEMORR LYMPH EXTRAV INC GUIDE ROADMAPPING MEDICATIONS: No additional ANESTHESIA/SEDATION: Moderate (conscious) sedation was employed during this procedure. A total of Versed 4.0 mg and Fentanyl 100 mcg was administered intravenously. Moderate Sedation Time: 68 minutes. The patient's level of consciousness and vital signs were monitored continuously by radiology nursing throughout the procedure under my direct supervision. CONTRAST:  100 cc FLUOROSCOPY TIME:  Fluoroscopy  Time: 23 minutes 24 seconds (989 mGy). COMPLICATIONS: None PROCEDURE: Informed consent was obtained from the patient following explanation of the procedure, risks, benefits and alternatives. The patient understands, agrees and consents for the procedure. All questions were addressed. A time out was performed prior to the initiation of the procedure. Maximal barrier sterile technique utilized including caps, mask, sterile gowns, sterile gloves, large sterile drape, hand hygiene, and Betadine prep. Ultrasound survey of the right inguinal region was performed with images stored and sent to PACs. A micropuncture needle was used access the right common femoral artery under ultrasound. With excellent arterial blood flow returned, and an .018 micro wire was passed through the needle, observed enter the abdominal aorta under fluoroscopy. The needle was removed, and a micropuncture sheath was placed over the wire. The inner dilator and wire were removed, and an 035 Bentson wire was advanced under fluoroscopy into the abdominal aorta. The sheath was removed and a standard 5 [place] vascular sheath was placed. The dilator was removed and the sheath was flushed. Mickelson catheter was advanced over the Bentson wire into the thoracic aorta. Double flush of the catheter was performed in the proximal descending thoracic aorta. Mickelson catheter was formed over the aortic arch, and the right bronchial artery was selected. Angiogram was performed. Micro  catheter arrangement with a high-flow Renegade and 014 Fathom wire was introduced. Micro catheter was advanced beyond the origin of the supreme intercostal artery. Repeat angiogram was performed. Embolization was then performed with 300 - 500 micro meter embosphere. Angiogram was not performed. Micro catheter was withdrawn slightly and a final embolization was performed with 500 -700 micro meter embosphere. Final angiogram performed. The micro catheter system was removed, and additional segmental artery interrogation was performed. The thoracic 6 right segmental artery was interrogated. After initial angiogram, the micro catheter system was reintroduced, and an attempt at selecting hypertrophied early branch to the right lower lobe was performed. During the attempted catheterization, the artery was in spasm/dissection, and a final angiogram at this segmental vessel demonstrated decreased flow. Thoracic 7 right segmental artery was interrogated. Again, abnormal early tortuous branch was identified with small contribution to the medial region of the lower right lung. Thoracic 8 right segmental artery interrogated. Thoracic 9 right segmental artery interrogated. Thoracic 10 right segmental artery interrogated. Angiogram of the right common femoral artery was performed. Catheters and wires were removed and an Exoseal device was deployed for hemostasis. Patient tolerated the procedure well and remained hemodynamically stable throughout. No complications were encountered and no significant blood loss encountered. FINDINGS: Ultrasound survey of the right inguinal region demonstrates widely pacer right common femoral artery. Initial angiogram of the right bronchial artery at the thoracic 5 level demonstrates significant contribution to the right hilar region and right lower lung via very abnormal vasculature, which have abnormal tortuous anatomy and enlarging diameter as they continue distally from the bronchial artery. The  right supreme intercostal artery arises from the apex of the bronchial artery. Status post embolization, there is nearly no flow to the hilar region and right lower lung via this right bronchial artery. Angiogram of thoracic 7 segmental artery demonstrates and early abnormal tortuous branch contributing to the infrahilar right lower lung. During attempted selection with the micro catheter system, the artery was spasm/dissected, with final angiogram demonstrating decreased flow to this territory with patency of the segmental vessel maintained. Embolization was not performed at this level. Angiogram of thoracic 8 segmental artery demonstrate a similar early tortuous abnormal artery, with minimal  contribution to the infrahilar region. Angiogram of thoracic 9 segmental artery demonstrates no significant contribution. Angiogram of thoracic 10 segmental artery demonstrates no significant contribution. IMPRESSION: Status post angiogram of right bronchial artery and 5 additional right-sided segmental arteries, with embosphere embolization of the right bronchial artery for hemostasis. Status post Exoseal deployment. Signed, Dulcy Fanny. Earleen Newport, DO [facility] Radiology Electronically Signed   By: Corrie Mckusick D.O.   On: 07/18/2015 18:04     PERTINENT LAB RESULTS: CBC:  Recent Labs  07/19/15 0509 07/20/15 0434  WBC 11.1* 8.3  HGB 12.2 11.8*  HCT 36.0 34.4*  PLT 355 347   CMET CMP     Component Value Date/Time   NA 137 07/19/2015 0509   NA 139 06/28/2015 0930   K 3.8 07/19/2015 0509   K 3.6 06/28/2015 0930   CL 109 07/19/2015 0509   CO2 23 07/19/2015 0509   CO2 26 06/28/2015 0930   GLUCOSE 88 07/19/2015 0509   GLUCOSE 106 06/28/2015 0930   BUN 9 07/19/2015 0509   BUN 11.3 06/28/2015 0930   CREATININE 0.40* 07/19/2015 0509   CREATININE 0.6 06/28/2015 0930   CALCIUM 7.5* 07/19/2015 0509   CALCIUM 9.0 06/28/2015 0930   PROT 6.5 07/18/2015 0453    PROT 6.6 06/28/2015 0930   ALBUMIN 3.7 07/18/2015 0453   ALBUMIN 3.4* 06/28/2015 0930   AST 20 07/18/2015 0453   AST 14 06/28/2015 0930   ALT 18 07/18/2015 0453   ALT 19 06/28/2015 0930   ALKPHOS 46 07/18/2015 0453   ALKPHOS 50 06/28/2015 0930   BILITOT 0.7 07/18/2015 0453   BILITOT 0.67 06/28/2015 0930   GFRNONAA >60 07/19/2015 0509   GFRAA >60 07/19/2015 0509    GFR Estimated Creatinine Clearance: 65.1 mL/min (by C-G formula based on Cr of 0.4). No results for input(s): LIPASE, AMYLASE in the last 72 hours. No results for input(s): CKTOTAL, CKMB, CKMBINDEX, TROPONINI in the last 72 hours. Invalid input(s): POCBNP No results for input(s): DDIMER in the last 72 hours. No results for input(s): HGBA1C in the last 72 hours. No results for input(s): CHOL, HDL, LDLCALC, TRIG, CHOLHDL, LDLDIRECT in the last 72 hours. No results for input(s): TSH, T4TOTAL, T3FREE, THYROIDAB in the last 72 hours.  Invalid input(s): FREET3 No results for input(s): VITAMINB12, FOLATE, FERRITIN, TIBC, IRON, RETICCTPCT in the last 72 hours. Coags: No results for input(s): INR in the last 72 hours.  Invalid input(s): PT Microbiology: No results found for this or any previous visit (from the past 240 hour(s)).   BRIEF HOSPITAL COURSE:  Hemoptysis: Likely secondary to underlying malignancy and probable radiation pneumonitis. Underwent right bronchial artery angiogram and embolization on 7/3( she previously had undergone a similar procedure on 08/10/2014), following which hemoptysis seems to have improved Significantly. Per patient it is now scant, her last hemoptysis episode was yesterday around 5 PM, following which she has had no further episodes. Per Dr. Julien Nordmann, patient will likely have some intermittent hemoptysis ongoing for the foreseeable future. She has also been seen by radiation oncology this hospital stay, and plans are to start palliative radiation from 7/6, following which plans are to discharge her  home later today-she will continue with radiation therapy as an outpatient.  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 Julien Nordmann and is on Tarceva daily  and Xgeva monthly. She recently had a bronchoscopy on 6/21 which showed mass in the right lower lobe, and was thought to have hemoptysis likely originating from this area. Dr. Candiss Norse had spoken with PCCM M.D. on 7/4, and was told that there was no further recommendations from pulmonology this point. See above- regarding plans to reinitiate palliative radiation.  TODAY-DAY OF DISCHARGE:  Subjective:   [place] today has no headache,no chest abdominal pain,no new weakness tingling or numbness, feels much better wants to go home today. No episode of hemoptysis today.  Objective:   Blood pressure 99/53, pulse 83, temperature 98 F (36.7 C), temperature source Oral, resp. rate 18, height 5' (1.524 m), weight 47.356 kg (104 lb 6.4 oz), SpO2 98 %.  Intake/Output Summary (Last 24 hours) at 07/21/15 1055 Last data filed at 07/20/15 2300  Gross per 24 hour  Intake    640 ml  Output      0 ml  Net    640 ml   Filed Weights   07/18/15 0430  Weight: 47.356 kg (104 lb 6.4 oz)    Exam Awake Alert, Oriented *3, No new F.N deficits, Normal affect [place].AT,PERRAL Supple Neck,No JVD, No cervical lymphadenopathy appriciated.  Symmetrical Chest wall movement, Good air movement bilaterally, CTAB RRR,No Gallops,Rubs or new Murmurs, No Parasternal Heave +ve B.Sounds, Abd Soft, Non tender, No organomegaly appriciated, No rebound -guarding or rigidity. No Cyanosis, Clubbing or edema, No new Rash or bruise  DISCHARGE CONDITION: Stable  DISPOSITION: Home  DISCHARGE INSTRUCTIONS:    Activity:  As tolerated   Get Medicines reviewed and adjusted: Please take all your  medications with you for your next visit with your Primary MD  Please request your Primary MD to go over all hospital tests and procedure/radiological results at the follow up, please ask your Primary MD to get all Hospital records sent to his/her office.  If you experience worsening of your admission symptoms, develop shortness of breath, life threatening emergency, suicidal or homicidal thoughts you must seek medical attention immediately by calling 911 or calling your MD immediately  if symptoms less severe.  You must read complete instructions/literature along with all the possible adverse reactions/side effects for all the Medicines you take and that have been prescribed to you. Take any new Medicines after you have completely understood and accpet all the possible adverse reactions/side effects.   Do not drive when taking Pain medications.   Do not take more than prescribed Pain, Sleep and Anxiety Medications  Special Instructions: If you have smoked or chewed Tobacco  in the last 2 yrs please stop smoking, stop any regular Alcohol  and or any Recreational drug use.  Wear Seat belts while driving.  Please note  You were cared for by a hospitalist during your hospital stay. Once you are discharged, your primary care physician will handle any further medical issues. Please note that NO REFILLS for any discharge medications will be authorized once you are discharged, as it is imperative that you return to your primary care physician (or establish a relationship with a primary care physician if you do not have one) for your aftercare needs so that they can reassess your need for medications and monitor your lab values.   Diet recommendation: Regular Diet  Discharge Instructions    Call MD for:    Complete by:  As directed   If severe hemoptysis recurs     Diet general    Complete by:  As directed  Increase activity slowly    Complete by:  As directed            Follow-up  Information    Follow up with [name], MD. Schedule an appointment as soon as possible for a visit in 2 weeks.   Specialty:  Family Medicine   Contact information:   [place]  [address] [phone]       Follow up with Eilleen Kempf., MD. Schedule an appointment as soon as possible for a visit in 1 week.   Specialty:  Oncology   Contact information:   [address] [place] [address] [phone]       Please follow up.   Contact information:   Please follow with radiation oncology as scheduled.      Total Time spent on discharge equals 25  minutes.  SignedOren Binet 07/21/2015 10:55 AM

## 2015-07-22 ENCOUNTER — Encounter: Payer: Self-pay | Admitting: Radiation Oncology

## 2015-07-22 ENCOUNTER — Other Ambulatory Visit: Payer: Self-pay | Admitting: Nurse Practitioner

## 2015-07-22 ENCOUNTER — Other Ambulatory Visit: Payer: Self-pay | Admitting: *Deleted

## 2015-07-22 ENCOUNTER — Ambulatory Visit
Admission: RE | Admit: 2015-07-22 | Discharge: 2015-07-22 | Disposition: A | Discharge status: Home / Self Care | Payer: Medicaid Other | Source: Ambulatory Visit | Attending: Radiation Oncology | Admitting: Radiation Oncology

## 2015-07-22 ENCOUNTER — Ambulatory Visit
Admit: 2015-07-22 | Discharge: 2015-07-22 | Disposition: A | Discharge status: Home / Self Care | Payer: Medicaid Other | Attending: Radiation Oncology | Admitting: Radiation Oncology

## 2015-07-22 VITALS — BP 101/53 | HR 86 | Ht 60.0 in | Wt 104.0 lb

## 2015-07-22 DIAGNOSIS — R590 Localized enlarged lymph nodes: Secondary | ICD-10-CM | POA: Diagnosis not present

## 2015-07-22 DIAGNOSIS — C7972 Secondary malignant neoplasm of left adrenal gland: Secondary | ICD-10-CM | POA: Diagnosis not present

## 2015-07-22 DIAGNOSIS — C342 Malignant neoplasm of middle lobe, bronchus or lung: Secondary | ICD-10-CM | POA: Diagnosis present

## 2015-07-22 DIAGNOSIS — C7951 Secondary malignant neoplasm of bone: Secondary | ICD-10-CM | POA: Diagnosis present

## 2015-07-22 DIAGNOSIS — C3491 Malignant neoplasm of unspecified part of right bronchus or lung: Secondary | ICD-10-CM

## 2015-07-22 DIAGNOSIS — C3431 Malignant neoplasm of lower lobe, right bronchus or lung: Secondary | ICD-10-CM | POA: Insufficient documentation

## 2015-07-22 DIAGNOSIS — Z510 Encounter for antineoplastic radiation therapy: Secondary | ICD-10-CM | POA: Diagnosis not present

## 2015-07-22 MED ORDER — ERLOTINIB HCL 100 MG PO TABS: 100.0000 mg | ORAL_TABLET | Freq: Every day | ORAL | Status: DC

## 2015-07-22 MED ORDER — SONAFINE EX EMUL
1.0000 "application " | Freq: Two times a day (BID) | CUTANEOUS | Status: DC
  Administered 2015-07-22: 1 via TOPICAL

## 2015-07-22 NOTE — Progress Notes (Signed)
Department of Radiation Oncology  Phone:  [phone] Fax:        [phone]  Weekly Treatment Note    Name: Deborah Foster Date: 07/22/2015 MRN: [id number] DOB: November 24, 1970   Diagnosis:     ICD-9-CM ICD-10-CM   1. Primary lung adenocarcinoma, right (HCC) 162.9 C34.91 SONAFINE emulsion 1 application  2. Primary cancer of right lower lobe of lung (HCC) 162.5 C34.31      Current dose: 6 Gy  Current fraction: 2   MEDICATIONS: Current Outpatient Prescriptions  Medication Sig Dispense Refill  . albuterol (PROVENTIL HFA;VENTOLIN HFA) 108 (90 Base) MCG/ACT inhaler Inhale 2 puffs into the lungs every 6 (six) hours as needed for wheezing or shortness of breath. 1 Inhaler 0  . benzonatate (TESSALON) 100 MG capsule Take 1 capsule (100 mg total) by mouth 3 (three) times daily as needed for cough. 30 capsule 0  . chlorpheniramine-HYDROcodone (TUSSIONEX) 10-8 MG/5ML SUER Take 5 mLs by mouth every 12 (twelve) hours as needed for cough. 140 mL 0  . clindamycin (CLINDAGEL) 1 % gel Apply 1 application topically 2 (two) times daily.    . Cyanocobalamin (VITAMIN B-12) 2500 MCG SUBL Place 2,500 mcg under the tongue daily.    Marland Kitchen erlotinib (TARCEVA) 100 MG tablet Take 1 tablet (100 mg total) by mouth daily. Take on an empty stomach 1 hour before meals or 2 hours after 30 tablet 1  . feeding supplement (BOOST / RESOURCE BREEZE) LIQD Take 1 Container by mouth 2 (two) times daily between meals. 60 Container 0  . ferrous sulfate 325 (65 FE) MG EC tablet Take 325 mg by mouth daily with breakfast.    . folic acid (FOLVITE) 989 MCG tablet Take 800 mcg by mouth daily.    Marland Kitchen loratadine (CLARITIN) 10 MG tablet Take 10 mg by mouth daily.    . mometasone-formoterol (DULERA) 200-5 MCG/ACT AERO Inhale 2 puffs into the lungs 2 (two) times daily as needed. Reported on 06/28/2015    . Nutritional Supplements (JUICE PLUS FIBRE PO) Take 1 capsule by mouth daily. [employer] once daily [employer] once daily [employer] once daily    . TURMERIC PO Take 1 capsule by mouth 2 (two) times daily. Turmeric Superior 750 mg po bid    . vitamin B-12 1000 MCG tablet Take 1 tablet (1,000 mcg total) by mouth daily. 30 tablet 0  . Wound Dressings (SONAFINE EX) Apply 1 application topically daily.     Current Facility-Administered Medications  Medication Dose Route Frequency Provider Last Rate Last Dose  . SONAFINE emulsion 1 application  1 application Topical BID Hayden Pedro, [place]   1 application at 21/19/41 1149     ALLERGIES: Other and Doxycycline   LABORATORY DATA:  Lab Results  Component Value Date   WBC 8.3 07/20/2015   HGB 11.8* 07/20/2015   HCT 34.4* 07/20/2015   MCV 93.7 07/20/2015   PLT 347 07/20/2015   Lab Results  Component Value Date   NA 137 07/19/2015   K 3.8 07/19/2015   CL 109 07/19/2015   CO2 23 07/19/2015   Lab Results  Component Value Date   ALT 18 07/18/2015   AST 20 07/18/2015   ALKPHOS 46 07/18/2015   BILITOT 0.7 07/18/2015     NARRATIVE: Deborah Foster was seen today for weekly treatment management. The chart was checked and the patient's films were reviewed.  Education today with interpreter present. Discussed skin care and given Biafine with instructions for use,  BID daily. Discussed need to inform staff of difficulty swallowing, fatigue, if present during her treatment phase and this was reiterated by her interpreter. Discussed fatigue management. She reports headache today after treatment, but has decreased in intensity once in the nursing clinic area. Denies vision changes or ataxia. Reports sputum production that is brown in color.   PHYSICAL EXAMINATION: height is 5' (1.524 m) and weight is 104 lb (47.174 kg). Her blood pressure is 101/53 and her pulse is 86.      Alert. In no acute distress.   ASSESSMENT: The patient is doing satisfactorily with treatment.  PLAN: We will continue with the patient's radiation treatment as planned.       ------------------------------------------------  Jodelle Gross, MD, PhD  This document serves as a record of services personally performed by Kyung Rudd, MD. It was created on his behalf by Arlyce Harman, a trained medical scribe. The creation of this record is based on the scribe's personal observations and the provider's statements to them. This document has been checked and approved by the attending provider.

## 2015-07-22 NOTE — Telephone Encounter (Signed)
Rx for Tarceva faxed to [facility]

## 2015-07-22 NOTE — Progress Notes (Addendum)
Education today with interpreter present.  Discussed skin care and given Biafine with instructions for use, BID daily.  Discussed need to inform staff of difficulty swallowing, fatigue, if present during her treatment phase and this was reiterated by her interpreter.   Discussed fatigue management.   She reports headache today after treatment, but has decreased in intensity once in the nursing clinic area.  Denies vision changes or ataxia.

## 2015-07-25 ENCOUNTER — Telehealth: Payer: Self-pay | Admitting: Internal Medicine

## 2015-07-25 ENCOUNTER — Ambulatory Visit
Admit: 2015-07-25 | Discharge: 2015-07-25 | Disposition: A | Discharge status: Home / Self Care | Payer: Medicaid Other | Attending: Radiation Oncology | Admitting: Radiation Oncology

## 2015-07-25 DIAGNOSIS — Z510 Encounter for antineoplastic radiation therapy: Secondary | ICD-10-CM | POA: Diagnosis not present

## 2015-07-25 NOTE — Telephone Encounter (Signed)
Per 7/7 pof moved 7/11 appointments to 7/13 with MM. Spoke with patient and interpreter today after xrt re change and gave patient new schedule.

## 2015-07-26 ENCOUNTER — Ambulatory Visit
Admit: 2015-07-26 | Discharge: 2015-07-26 | Disposition: A | Discharge status: Home / Self Care | Payer: Medicaid Other | Attending: Radiation Oncology | Admitting: Radiation Oncology

## 2015-07-26 ENCOUNTER — Other Ambulatory Visit: Payer: Self-pay

## 2015-07-26 ENCOUNTER — Ambulatory Visit: Payer: Self-pay | Admitting: Nurse Practitioner

## 2015-07-26 ENCOUNTER — Ambulatory Visit: Payer: Self-pay

## 2015-07-26 DIAGNOSIS — Z510 Encounter for antineoplastic radiation therapy: Secondary | ICD-10-CM | POA: Diagnosis not present

## 2015-07-27 ENCOUNTER — Ambulatory Visit
Admit: 2015-07-27 | Discharge: 2015-07-27 | Disposition: A | Discharge status: Home / Self Care | Payer: Medicaid Other | Attending: Radiation Oncology | Admitting: Radiation Oncology

## 2015-07-27 DIAGNOSIS — Z510 Encounter for antineoplastic radiation therapy: Secondary | ICD-10-CM | POA: Diagnosis not present

## 2015-07-28 ENCOUNTER — Other Ambulatory Visit (HOSPITAL_BASED_OUTPATIENT_CLINIC_OR_DEPARTMENT_OTHER): Payer: Medicaid Other

## 2015-07-28 ENCOUNTER — Ambulatory Visit
Admit: 2015-07-28 | Discharge: 2015-07-28 | Disposition: A | Discharge status: Home / Self Care | Payer: Medicaid Other | Attending: Radiation Oncology | Admitting: Radiation Oncology

## 2015-07-28 ENCOUNTER — Encounter: Payer: Self-pay | Admitting: Internal Medicine

## 2015-07-28 ENCOUNTER — Ambulatory Visit (HOSPITAL_BASED_OUTPATIENT_CLINIC_OR_DEPARTMENT_OTHER): Payer: Medicaid Other | Admitting: Internal Medicine

## 2015-07-28 ENCOUNTER — Other Ambulatory Visit: Payer: Self-pay | Admitting: Internal Medicine

## 2015-07-28 ENCOUNTER — Telehealth: Payer: Self-pay | Admitting: Internal Medicine

## 2015-07-28 ENCOUNTER — Ambulatory Visit (HOSPITAL_BASED_OUTPATIENT_CLINIC_OR_DEPARTMENT_OTHER): Payer: Medicaid Other

## 2015-07-28 VITALS — BP 105/57 | HR 100 | Temp 97.7°F | Resp 16 | Ht 60.0 in | Wt 103.2 lb

## 2015-07-28 DIAGNOSIS — C3431 Malignant neoplasm of lower lobe, right bronchus or lung: Secondary | ICD-10-CM

## 2015-07-28 DIAGNOSIS — M9080 Osteopathy in diseases classified elsewhere, unspecified site: Secondary | ICD-10-CM

## 2015-07-28 DIAGNOSIS — R042 Hemoptysis: Secondary | ICD-10-CM

## 2015-07-28 DIAGNOSIS — C342 Malignant neoplasm of middle lobe, bronchus or lung: Secondary | ICD-10-CM

## 2015-07-28 DIAGNOSIS — C7951 Secondary malignant neoplasm of bone: Secondary | ICD-10-CM

## 2015-07-28 DIAGNOSIS — Z5111 Encounter for antineoplastic chemotherapy: Secondary | ICD-10-CM

## 2015-07-28 DIAGNOSIS — C3491 Malignant neoplasm of unspecified part of right bronchus or lung: Secondary | ICD-10-CM

## 2015-07-28 DIAGNOSIS — C7972 Secondary malignant neoplasm of left adrenal gland: Secondary | ICD-10-CM | POA: Diagnosis not present

## 2015-07-28 DIAGNOSIS — R630 Anorexia: Secondary | ICD-10-CM

## 2015-07-28 DIAGNOSIS — C7931 Secondary malignant neoplasm of brain: Secondary | ICD-10-CM

## 2015-07-28 DIAGNOSIS — E889 Metabolic disorder, unspecified: Secondary | ICD-10-CM

## 2015-07-28 DIAGNOSIS — Z510 Encounter for antineoplastic radiation therapy: Secondary | ICD-10-CM | POA: Diagnosis not present

## 2015-07-28 DIAGNOSIS — M898X9 Other specified disorders of bone, unspecified site: Secondary | ICD-10-CM

## 2015-07-28 LAB — CBC WITH DIFFERENTIAL/PLATELET
BASO%: 0.3 % (ref 0.0–2.0)
Basophils Absolute: 0 10*3/uL (ref 0.0–0.1)
EOS ABS: 0.1 10*3/uL (ref 0.0–0.5)
EOS%: 1.4 % (ref 0.0–7.0)
HEMATOCRIT: 42.5 % (ref 34.8–46.6)
HGB: 14.3 g/dL (ref 11.6–15.9)
LYMPH#: 0.8 10*3/uL — AB (ref 0.9–3.3)
LYMPH%: 9.5 % — AB (ref 14.0–49.7)
MCH: 31.4 pg (ref 25.1–34.0)
MCHC: 33.5 g/dL (ref 31.5–36.0)
MCV: 93.8 fL (ref 79.5–101.0)
MONO#: 0.7 10*3/uL (ref 0.1–0.9)
MONO%: 8.6 % (ref 0.0–14.0)
NEUT%: 80.2 % — AB (ref 38.4–76.8)
NEUTROS ABS: 6.5 10*3/uL (ref 1.5–6.5)
PLATELETS: 489 10*3/uL — AB (ref 145–400)
RBC: 4.54 10*6/uL (ref 3.70–5.45)
RDW: 13.3 % (ref 11.2–14.5)
WBC: 8.1 10*3/uL (ref 3.9–10.3)

## 2015-07-28 LAB — COMPREHENSIVE METABOLIC PANEL
ALK PHOS: 58 U/L (ref 40–150)
ALT: 12 U/L (ref 0–55)
ANION GAP: 8 meq/L (ref 3–11)
AST: 16 U/L (ref 5–34)
Albumin: 3.5 g/dL (ref 3.5–5.0)
BILIRUBIN TOTAL: 0.67 mg/dL (ref 0.20–1.20)
BUN: 8.4 mg/dL (ref 7.0–26.0)
CALCIUM: 9.1 mg/dL (ref 8.4–10.4)
CO2: 26 meq/L (ref 22–29)
CREATININE: 0.6 mg/dL (ref 0.6–1.1)
Chloride: 103 mEq/L (ref 98–109)
Glucose: 93 mg/dl (ref 70–140)
Potassium: 3.6 mEq/L (ref 3.5–5.1)
Sodium: 138 mEq/L (ref 136–145)
TOTAL PROTEIN: 7 g/dL (ref 6.4–8.3)

## 2015-07-28 MED ORDER — DENOSUMAB 120 MG/1.7ML ~~LOC~~ SOLN
120.0000 mg | Freq: Once | SUBCUTANEOUS | Status: AC
  Administered 2015-07-28: 120 mg via SUBCUTANEOUS
  Filled 2015-07-28: qty 1.7

## 2015-07-28 MED ORDER — ERLOTINIB HCL 100 MG PO TABS: 100.0000 mg | ORAL_TABLET | Freq: Every day | ORAL | Status: DC

## 2015-07-28 MED ORDER — FOLIC ACID 800 MCG PO TABS: 800.0000 ug | ORAL_TABLET | Freq: Every day | ORAL | Status: DC

## 2015-07-28 NOTE — Progress Notes (Signed)
[place] [phone]   [phone]  OFFICE PROGRESS NOTE  [name], MD [place] [address]  DIAGNOSIS: Stage IV (T2a, N1, M1b) non-small cell lung cancer, adenocarcinoma presented with right middle lobe lung mass in addition to right hilar adenopathy as well as metastatic disease to the bone, brain as well as left adrenal metastasis diagnosed in July 2016  PRIOR THERAPY:  1) palliative radiotherapy to the brain as well as metastatic bone lesions in the pelvis. 2) Tarceva 150 mg by mouth daily started on 09/25/2014. Status post 5 months of treatment  CURRENT THERAPY:  1) Tarceva 100 mg by mouth daily started on 02/19/2015. Status post 4 months of treatment. 2) Xgeva 120 g subcutaneously monthly.  INTERVAL HISTORY: Deborah Foster 45 y.o. female returns to the clinic today for hospital follow-up visit accompanied by her interpreter. The patient is feeling better today. She was admitted recently to [facility] with persistent hemoptysis. She underwent embolization of the bronchial artery with no improvement. The patient was seen by Dr. Lisbeth Renshaw and she is currently started on palliative radiotherapy. She is feeling much better with no recent hemoptysis. She lost 3 pounds during her hospitalization. The patient is tolerating her current treatment with Tarceva fairly well except for grade 1 skin rash mainly on the face and upper chest. She denied having any significant chest pain, or shortness of breath. She denied having any nausea or vomiting. She has occasional diarrhea. She is here today for repeat blood work and IT consultant injection.  MEDICAL HISTORY: Past Medical History  Diagnosis Date  . Pneumonia   . Hemoptysis   . Lung mass   . Hypokalemia   . Radiation 08/23/14-09/07/14    Brain/chest and left hip 30 Gy 12 Fx  . lung ca dx'd 07/2014  . Metastasis to brain ([place])   . Metastasis to adrenal gland ([place])   . Bone  metastasis ([place])   . URI (upper respiratory infection) 02-02-2015    ALLERGIES:  is allergic to other and doxycycline.  MEDICATIONS:  Current Outpatient Prescriptions  Medication Sig Dispense Refill  . albuterol (PROVENTIL HFA;VENTOLIN HFA) 108 (90 Base) MCG/ACT inhaler Inhale 2 puffs into the lungs every 6 (six) hours as needed for wheezing or shortness of breath. 1 Inhaler 0  . benzonatate (TESSALON) 100 MG capsule Take 1 capsule (100 mg total) by mouth 3 (three) times daily as needed for cough. 30 capsule 0  . chlorpheniramine-HYDROcodone (TUSSIONEX) 10-8 MG/5ML SUER Take 5 mLs by mouth every 12 (twelve) hours as needed for cough. 140 mL 0  . clindamycin (CLINDAGEL) 1 % gel Apply 1 application topically 2 (two) times daily.    . Cyanocobalamin (VITAMIN B-12) 2500 MCG SUBL Place 2,500 mcg under the tongue daily.    Marland Kitchen erlotinib (TARCEVA) 100 MG tablet Take 1 tablet (100 mg total) by mouth daily. Take on an empty stomach 1 hour before meals or 2 hours after 30 tablet 1  . feeding supplement (BOOST / RESOURCE BREEZE) LIQD Take 1 Container by mouth 2 (two) times daily between meals. 60 Container 0  . ferrous sulfate 325 (65 FE) MG EC tablet Take 325 mg by mouth daily with breakfast.    . folic acid (FOLVITE) 326 MCG tablet Take 800 mcg by mouth daily.    Marland Kitchen loratadine (CLARITIN) 10 MG tablet Take 10 mg by mouth daily.    . mometasone-formoterol (DULERA) 200-5 MCG/ACT AERO Inhale 2 puffs  into the lungs 2 (two) times daily as needed. Reported on 06/28/2015    . TURMERIC PO Take 1 capsule by mouth 2 (two) times daily. Turmeric Superior 750 mg po bid    . Wound Dressings (SONAFINE EX) Apply 1 application topically daily.    . Nutritional Supplements (JUICE PLUS FIBRE PO) Take 1 capsule by mouth daily. Reported on 07/28/2015     No current facility-administered medications for this visit.    SURGICAL HISTORY:  Past Surgical History  Procedure Laterality Date  . Video bronchoscopy Bilateral  08/09/2014    Procedure: VIDEO BRONCHOSCOPY WITHOUT FLUORO;  Surgeon: [name], MD;  Location: [place];  Service: Cardiopulmonary;  Laterality: Bilateral;  . Video bronchoscopy Bilateral 07/06/2015    Procedure: VIDEO BRONCHOSCOPY WITHOUT FLUORO;  Surgeon: [name], MD;  Location: [facility];  Service: Cardiopulmonary;  Laterality: Bilateral;    REVIEW OF SYSTEMS:  A comprehensive review of systems was negative except for: Constitutional: positive for weight loss Respiratory: positive for cough Integument/breast: positive for rash   PHYSICAL EXAMINATION: General appearance: alert, cooperative, fatigued and no distress Head: Normocephalic, without obvious abnormality, atraumatic Neck: no adenopathy, no JVD, supple, symmetrical, trachea midline and thyroid not enlarged, symmetric, no tenderness/mass/nodules Lymph nodes: Cervical, supraclavicular, and axillary nodes normal. Resp: clear to auscultation bilaterally Back: symmetric, no curvature. ROM normal. No CVA tenderness. Cardio: regular rate and rhythm, S1, S2 normal, no murmur, click, rub or gallop GI: soft, non-tender; bowel sounds normal; no masses,  no organomegaly Extremities: extremities normal, atraumatic, no cyanosis or edema Neurologic: Alert and oriented X 3, normal strength and tone. Normal symmetric reflexes. Normal coordination and gait  Skin exam showed maculopapular grade 1 skin rash on the face and upper chest.  ECOG PERFORMANCE STATUS: 1 - Symptomatic but completely ambulatory  Blood pressure 105/57, pulse 100, temperature 97.7 F (36.5 C), temperature source Oral, resp. rate 16, height 5' (1.524 m), weight 103 lb 3.2 oz (46.811 kg), SpO2 100 %.  LABORATORY DATA: Lab Results  Component Value Date   WBC 8.1 07/28/2015   HGB 14.3 07/28/2015   HCT 42.5 07/28/2015   MCV 93.8 07/28/2015   PLT 489* 07/28/2015      Chemistry      Component Value Date/Time   NA 138 07/28/2015 0918   NA 137  07/19/2015 0509   K 3.6 07/28/2015 0918   K 3.8 07/19/2015 0509   CL 109 07/19/2015 0509   CO2 26 07/28/2015 0918   CO2 23 07/19/2015 0509   BUN 8.4 07/28/2015 0918   BUN 9 07/19/2015 0509   CREATININE 0.6 07/28/2015 0918   CREATININE 0.40* 07/19/2015 0509      Component Value Date/Time   CALCIUM 9.1 07/28/2015 0918   CALCIUM 7.5* 07/19/2015 0509   ALKPHOS 58 07/28/2015 0918   ALKPHOS 46 07/18/2015 0453   AST 16 07/28/2015 0918   AST 20 07/18/2015 0453   ALT 12 07/28/2015 0918   ALT 18 07/18/2015 0453   BILITOT 0.67 07/28/2015 0918   BILITOT 0.7 07/18/2015 0453       RADIOGRAPHIC STUDIES: Dg Chest 2 View  07/18/2015  CLINICAL DATA:  Acute onset of hemoptysis. Personal history of lung cancer. Initial encounter. EXAM: CHEST  2 VIEW COMPARISON:  Chest radiograph performed 06/20/2015, and CTA of the chest performed 06/02/2015 FINDINGS: Right perihilar airspace opacification is grossly unchanged in appearance. The left lung remains relatively clear. No pleural effusion or pneumothorax is seen. The heart is normal in size.  No acute osseous abnormalities identified. IMPRESSION: Right perihilar airspace opacification again noted, stable in appearance, likely reflecting posttreatment change, though if the patient's hemoptysis persists, bronchoscopy could be considered for further evaluation, as suggested on prior CTA. Electronically Signed   By: Garald Balding M.D.   On: 07/18/2015 02:27   Ir Angiogram Pulmonary Right Selective  07/18/2015  INDICATION: 45 year old female with a history of hemoptysis. This is recurrent hemoptysis, with known non-small cell lung carcinoma and previous treatment with radiation therapy. She has been treated in July 2016 for hemoptysis. She has had recurrent bleeding with admission Saturday July 1st, and presents now for repeat angiogram and possible embolization. EXAM: RIGHT PULMONARY ARTERIOGRAPHY; IR ULTRASOUND GUIDANCE VASC ACCESS RIGHT; ADDITIONAL ARTERIOGRAPHY;  IR EMBO ART VEN HEMORR LYMPH EXTRAV INC GUIDE ROADMAPPING MEDICATIONS: No additional ANESTHESIA/SEDATION: Moderate (conscious) sedation was employed during this procedure. A total of Versed 4.0 mg and Fentanyl 100 mcg was administered intravenously. Moderate Sedation Time: 68 minutes. The patient's level of consciousness and vital signs were monitored continuously by radiology nursing throughout the procedure under my direct supervision. CONTRAST:  100 cc FLUOROSCOPY TIME:  Fluoroscopy Time: 23 minutes 24 seconds (989 mGy). COMPLICATIONS: None PROCEDURE: Informed consent was obtained from the patient following explanation of the procedure, risks, benefits and alternatives. The patient understands, agrees and consents for the procedure. All questions were addressed. A time out was performed prior to the initiation of the procedure. Maximal barrier sterile technique utilized including caps, mask, sterile gowns, sterile gloves, large sterile drape, hand hygiene, and Betadine prep. Ultrasound survey of the right inguinal region was performed with images stored and sent to PACs. A micropuncture needle was used access the right common femoral artery under ultrasound. With excellent arterial blood flow returned, and an .018 micro wire was passed through the needle, observed enter the abdominal aorta under fluoroscopy. The needle was removed, and a micropuncture sheath was placed over the wire. The inner dilator and wire were removed, and an 035 Bentson wire was advanced under fluoroscopy into the abdominal aorta. The sheath was removed and a standard 5 [place] vascular sheath was placed. The dilator was removed and the sheath was flushed. Mickelson catheter was advanced over the Bentson wire into the thoracic aorta. Double flush of the catheter was performed in the proximal descending thoracic aorta. Mickelson catheter was formed over the aortic arch, and the right bronchial artery was selected. Angiogram was performed.  Micro catheter arrangement with a high-flow Renegade and 014 Fathom wire was introduced. Micro catheter was advanced beyond the origin of the supreme intercostal artery. Repeat angiogram was performed. Embolization was then performed with 300 - 500 micro meter embosphere. Angiogram was not performed. Micro catheter was withdrawn slightly and a final embolization was performed with 500 -700 micro meter embosphere. Final angiogram performed. The micro catheter system was removed, and additional segmental artery interrogation was performed. The thoracic 6 right segmental artery was interrogated. After initial angiogram, the micro catheter system was reintroduced, and an attempt at selecting hypertrophied early branch to the right lower lobe was performed. During the attempted catheterization, the artery was in spasm/dissection, and a final angiogram at this segmental vessel demonstrated decreased flow. Thoracic 7 right segmental artery was interrogated. Again, abnormal early tortuous branch was identified with small contribution to the medial region of the lower right lung. Thoracic 8 right segmental artery interrogated. Thoracic 9 right segmental artery interrogated. Thoracic 10 right segmental artery interrogated. Angiogram of the right common femoral artery was performed. Catheters  and wires were removed and an Exoseal device was deployed for hemostasis. Patient tolerated the procedure well and remained hemodynamically stable throughout. No complications were encountered and no significant blood loss encountered. FINDINGS: Ultrasound survey of the right inguinal region demonstrates widely pacer right common femoral artery. Initial angiogram of the right bronchial artery at the thoracic 5 level demonstrates significant contribution to the right hilar region and right lower lung via very abnormal vasculature, which have abnormal tortuous anatomy and enlarging diameter as they continue distally from the bronchial  artery. The right supreme intercostal artery arises from the apex of the bronchial artery. Status post embolization, there is nearly no flow to the hilar region and right lower lung via this right bronchial artery. Angiogram of thoracic 7 segmental artery demonstrates and early abnormal tortuous branch contributing to the infrahilar right lower lung. During attempted selection with the micro catheter system, the artery was spasm/dissected, with final angiogram demonstrating decreased flow to this territory with patency of the segmental vessel maintained. Embolization was not performed at this level. Angiogram of thoracic 8 segmental artery demonstrate a similar early tortuous abnormal artery, with minimal contribution to the infrahilar region. Angiogram of thoracic 9 segmental artery demonstrates no significant contribution. Angiogram of thoracic 10 segmental artery demonstrates no significant contribution. IMPRESSION: Status post angiogram of right bronchial artery and 5 additional right-sided segmental arteries, with embosphere embolization of the right bronchial artery for hemostasis. Status post Exoseal deployment. Signed, Dulcy Fanny. Earleen Newport, DO [facility] Radiology Electronically Signed   By: Corrie Mckusick D.O.   On: 07/18/2015 18:04   Ir Angiogram Pulmonary Right Selective  07/18/2015  INDICATION: 45 year old female with a history of hemoptysis. This is recurrent hemoptysis, with known non-small cell lung carcinoma and previous treatment with radiation therapy. She has been treated in July 2016 for hemoptysis. She has had recurrent bleeding with admission Saturday July 1st, and presents now for repeat angiogram and possible embolization. EXAM: RIGHT PULMONARY ARTERIOGRAPHY; IR ULTRASOUND GUIDANCE VASC ACCESS RIGHT; ADDITIONAL ARTERIOGRAPHY; IR EMBO ART VEN HEMORR LYMPH EXTRAV INC GUIDE ROADMAPPING MEDICATIONS: No additional ANESTHESIA/SEDATION: Moderate  (conscious) sedation was employed during this procedure. A total of Versed 4.0 mg and Fentanyl 100 mcg was administered intravenously. Moderate Sedation Time: 68 minutes. The patient's level of consciousness and vital signs were monitored continuously by radiology nursing throughout the procedure under my direct supervision. CONTRAST:  100 cc FLUOROSCOPY TIME:  Fluoroscopy Time: 23 minutes 24 seconds (989 mGy). COMPLICATIONS: None PROCEDURE: Informed consent was obtained from the patient following explanation of the procedure, risks, benefits and alternatives. The patient understands, agrees and consents for the procedure. All questions were addressed. A time out was performed prior to the initiation of the procedure. Maximal barrier sterile technique utilized including caps, mask, sterile gowns, sterile gloves, large sterile drape, hand hygiene, and Betadine prep. Ultrasound survey of the right inguinal region was performed with images stored and sent to PACs. A micropuncture needle was used access the right common femoral artery under ultrasound. With excellent arterial blood flow returned, and an .018 micro wire was passed through the needle, observed enter the abdominal aorta under fluoroscopy. The needle was removed, and a micropuncture sheath was placed over the wire. The inner dilator and wire were removed, and an 035 Bentson wire was advanced under fluoroscopy into the abdominal aorta. The sheath was removed and a standard 5 [place] vascular sheath was placed. The dilator was removed and the sheath was flushed. Mickelson catheter was advanced  over the Bentson wire into the thoracic aorta. Double flush of the catheter was performed in the proximal descending thoracic aorta. Mickelson catheter was formed over the aortic arch, and the right bronchial artery was selected. Angiogram was performed. Micro catheter arrangement with a high-flow Renegade and 014 Fathom wire was introduced. Micro catheter was advanced  beyond the origin of the supreme intercostal artery. Repeat angiogram was performed. Embolization was then performed with 300 - 500 micro meter embosphere. Angiogram was not performed. Micro catheter was withdrawn slightly and a final embolization was performed with 500 -700 micro meter embosphere. Final angiogram performed. The micro catheter system was removed, and additional segmental artery interrogation was performed. The thoracic 6 right segmental artery was interrogated. After initial angiogram, the micro catheter system was reintroduced, and an attempt at selecting hypertrophied early branch to the right lower lobe was performed. During the attempted catheterization, the artery was in spasm/dissection, and a final angiogram at this segmental vessel demonstrated decreased flow. Thoracic 7 right segmental artery was interrogated. Again, abnormal early tortuous branch was identified with small contribution to the medial region of the lower right lung. Thoracic 8 right segmental artery interrogated. Thoracic 9 right segmental artery interrogated. Thoracic 10 right segmental artery interrogated. Angiogram of the right common femoral artery was performed. Catheters and wires were removed and an Exoseal device was deployed for hemostasis. Patient tolerated the procedure well and remained hemodynamically stable throughout. No complications were encountered and no significant blood loss encountered. FINDINGS: Ultrasound survey of the right inguinal region demonstrates widely pacer right common femoral artery. Initial angiogram of the right bronchial artery at the thoracic 5 level demonstrates significant contribution to the right hilar region and right lower lung via very abnormal vasculature, which have abnormal tortuous anatomy and enlarging diameter as they continue distally from the bronchial artery. The right supreme intercostal artery arises from the apex of the bronchial artery. Status post embolization, there  is nearly no flow to the hilar region and right lower lung via this right bronchial artery. Angiogram of thoracic 7 segmental artery demonstrates and early abnormal tortuous branch contributing to the infrahilar right lower lung. During attempted selection with the micro catheter system, the artery was spasm/dissected, with final angiogram demonstrating decreased flow to this territory with patency of the segmental vessel maintained. Embolization was not performed at this level. Angiogram of thoracic 8 segmental artery demonstrate a similar early tortuous abnormal artery, with minimal contribution to the infrahilar region. Angiogram of thoracic 9 segmental artery demonstrates no significant contribution. Angiogram of thoracic 10 segmental artery demonstrates no significant contribution. IMPRESSION: Status post angiogram of right bronchial artery and 5 additional right-sided segmental arteries, with embosphere embolization of the right bronchial artery for hemostasis. Status post Exoseal deployment. Signed, Dulcy Fanny. Earleen Newport, DO [facility] Radiology Electronically Signed   By: Corrie Mckusick D.O.   On: 07/18/2015 18:04   Ir Angiogram Pulmonary Right Selective  07/18/2015  INDICATION: 45 year old female with a history of hemoptysis. This is recurrent hemoptysis, with known non-small cell lung carcinoma and previous treatment with radiation therapy. She has been treated in July 2016 for hemoptysis. She has had recurrent bleeding with admission Saturday July 1st, and presents now for repeat angiogram and possible embolization. EXAM: RIGHT PULMONARY ARTERIOGRAPHY; IR ULTRASOUND GUIDANCE VASC ACCESS RIGHT; ADDITIONAL ARTERIOGRAPHY; IR EMBO ART VEN HEMORR LYMPH EXTRAV INC GUIDE ROADMAPPING MEDICATIONS: No additional ANESTHESIA/SEDATION: Moderate (conscious) sedation was employed during this procedure. A total of Versed 4.0 mg and  Fentanyl 100 mcg was administered  intravenously. Moderate Sedation Time: 68 minutes. The patient's level of consciousness and vital signs were monitored continuously by radiology nursing throughout the procedure under my direct supervision. CONTRAST:  100 cc FLUOROSCOPY TIME:  Fluoroscopy Time: 23 minutes 24 seconds (989 mGy). COMPLICATIONS: None PROCEDURE: Informed consent was obtained from the patient following explanation of the procedure, risks, benefits and alternatives. The patient understands, agrees and consents for the procedure. All questions were addressed. A time out was performed prior to the initiation of the procedure. Maximal barrier sterile technique utilized including caps, mask, sterile gowns, sterile gloves, large sterile drape, hand hygiene, and Betadine prep. Ultrasound survey of the right inguinal region was performed with images stored and sent to PACs. A micropuncture needle was used access the right common femoral artery under ultrasound. With excellent arterial blood flow returned, and an .018 micro wire was passed through the needle, observed enter the abdominal aorta under fluoroscopy. The needle was removed, and a micropuncture sheath was placed over the wire. The inner dilator and wire were removed, and an 035 Bentson wire was advanced under fluoroscopy into the abdominal aorta. The sheath was removed and a standard 5 [place] vascular sheath was placed. The dilator was removed and the sheath was flushed. Mickelson catheter was advanced over the Bentson wire into the thoracic aorta. Double flush of the catheter was performed in the proximal descending thoracic aorta. Mickelson catheter was formed over the aortic arch, and the right bronchial artery was selected. Angiogram was performed. Micro catheter arrangement with a high-flow Renegade and 014 Fathom wire was introduced. Micro catheter was advanced beyond the origin of the supreme intercostal artery. Repeat angiogram was performed. Embolization was then performed with  300 - 500 micro meter embosphere. Angiogram was not performed. Micro catheter was withdrawn slightly and a final embolization was performed with 500 -700 micro meter embosphere. Final angiogram performed. The micro catheter system was removed, and additional segmental artery interrogation was performed. The thoracic 6 right segmental artery was interrogated. After initial angiogram, the micro catheter system was reintroduced, and an attempt at selecting hypertrophied early branch to the right lower lobe was performed. During the attempted catheterization, the artery was in spasm/dissection, and a final angiogram at this segmental vessel demonstrated decreased flow. Thoracic 7 right segmental artery was interrogated. Again, abnormal early tortuous branch was identified with small contribution to the medial region of the lower right lung. Thoracic 8 right segmental artery interrogated. Thoracic 9 right segmental artery interrogated. Thoracic 10 right segmental artery interrogated. Angiogram of the right common femoral artery was performed. Catheters and wires were removed and an Exoseal device was deployed for hemostasis. Patient tolerated the procedure well and remained hemodynamically stable throughout. No complications were encountered and no significant blood loss encountered. FINDINGS: Ultrasound survey of the right inguinal region demonstrates widely pacer right common femoral artery. Initial angiogram of the right bronchial artery at the thoracic 5 level demonstrates significant contribution to the right hilar region and right lower lung via very abnormal vasculature, which have abnormal tortuous anatomy and enlarging diameter as they continue distally from the bronchial artery. The right supreme intercostal artery arises from the apex of the bronchial artery. Status post embolization, there is nearly no flow to the hilar region and right lower lung via this right bronchial artery. Angiogram of thoracic 7  segmental artery demonstrates and early abnormal tortuous branch contributing to the infrahilar right lower lung. During attempted selection with the micro catheter system, the  artery was spasm/dissected, with final angiogram demonstrating decreased flow to this territory with patency of the segmental vessel maintained. Embolization was not performed at this level. Angiogram of thoracic 8 segmental artery demonstrate a similar early tortuous abnormal artery, with minimal contribution to the infrahilar region. Angiogram of thoracic 9 segmental artery demonstrates no significant contribution. Angiogram of thoracic 10 segmental artery demonstrates no significant contribution. IMPRESSION: Status post angiogram of right bronchial artery and 5 additional right-sided segmental arteries, with embosphere embolization of the right bronchial artery for hemostasis. Status post Exoseal deployment. Signed, Dulcy Fanny. Earleen Newport, DO [facility] Radiology Electronically Signed   By: Corrie Mckusick D.O.   On: 07/18/2015 18:04   Ir Angiogram Pulmonary Right Selective  07/18/2015  INDICATION: 45 year old female with a history of hemoptysis. This is recurrent hemoptysis, with known non-small cell lung carcinoma and previous treatment with radiation therapy. She has been treated in July 2016 for hemoptysis. She has had recurrent bleeding with admission Saturday July 1st, and presents now for repeat angiogram and possible embolization. EXAM: RIGHT PULMONARY ARTERIOGRAPHY; IR ULTRASOUND GUIDANCE VASC ACCESS RIGHT; ADDITIONAL ARTERIOGRAPHY; IR EMBO ART VEN HEMORR LYMPH EXTRAV INC GUIDE ROADMAPPING MEDICATIONS: No additional ANESTHESIA/SEDATION: Moderate (conscious) sedation was employed during this procedure. A total of Versed 4.0 mg and Fentanyl 100 mcg was administered intravenously. Moderate Sedation Time: 68 minutes. The patient's level of consciousness and vital signs were monitored  continuously by radiology nursing throughout the procedure under my direct supervision. CONTRAST:  100 cc FLUOROSCOPY TIME:  Fluoroscopy Time: 23 minutes 24 seconds (989 mGy). COMPLICATIONS: None PROCEDURE: Informed consent was obtained from the patient following explanation of the procedure, risks, benefits and alternatives. The patient understands, agrees and consents for the procedure. All questions were addressed. A time out was performed prior to the initiation of the procedure. Maximal barrier sterile technique utilized including caps, mask, sterile gowns, sterile gloves, large sterile drape, hand hygiene, and Betadine prep. Ultrasound survey of the right inguinal region was performed with images stored and sent to PACs. A micropuncture needle was used access the right common femoral artery under ultrasound. With excellent arterial blood flow returned, and an .018 micro wire was passed through the needle, observed enter the abdominal aorta under fluoroscopy. The needle was removed, and a micropuncture sheath was placed over the wire. The inner dilator and wire were removed, and an 035 Bentson wire was advanced under fluoroscopy into the abdominal aorta. The sheath was removed and a standard 5 [place] vascular sheath was placed. The dilator was removed and the sheath was flushed. Mickelson catheter was advanced over the Bentson wire into the thoracic aorta. Double flush of the catheter was performed in the proximal descending thoracic aorta. Mickelson catheter was formed over the aortic arch, and the right bronchial artery was selected. Angiogram was performed. Micro catheter arrangement with a high-flow Renegade and 014 Fathom wire was introduced. Micro catheter was advanced beyond the origin of the supreme intercostal artery. Repeat angiogram was performed. Embolization was then performed with 300 - 500 micro meter embosphere. Angiogram was not performed. Micro catheter was withdrawn slightly and a final  embolization was performed with 500 -700 micro meter embosphere. Final angiogram performed. The micro catheter system was removed, and additional segmental artery interrogation was performed. The thoracic 6 right segmental artery was interrogated. After initial angiogram, the micro catheter system was reintroduced, and an attempt at selecting hypertrophied early branch to the right lower lobe was performed. During the attempted catheterization, the  artery was in spasm/dissection, and a final angiogram at this segmental vessel demonstrated decreased flow. Thoracic 7 right segmental artery was interrogated. Again, abnormal early tortuous branch was identified with small contribution to the medial region of the lower right lung. Thoracic 8 right segmental artery interrogated. Thoracic 9 right segmental artery interrogated. Thoracic 10 right segmental artery interrogated. Angiogram of the right common femoral artery was performed. Catheters and wires were removed and an Exoseal device was deployed for hemostasis. Patient tolerated the procedure well and remained hemodynamically stable throughout. No complications were encountered and no significant blood loss encountered. FINDINGS: Ultrasound survey of the right inguinal region demonstrates widely pacer right common femoral artery. Initial angiogram of the right bronchial artery at the thoracic 5 level demonstrates significant contribution to the right hilar region and right lower lung via very abnormal vasculature, which have abnormal tortuous anatomy and enlarging diameter as they continue distally from the bronchial artery. The right supreme intercostal artery arises from the apex of the bronchial artery. Status post embolization, there is nearly no flow to the hilar region and right lower lung via this right bronchial artery. Angiogram of thoracic 7 segmental artery demonstrates and early abnormal tortuous branch contributing to the infrahilar right lower lung.  During attempted selection with the micro catheter system, the artery was spasm/dissected, with final angiogram demonstrating decreased flow to this territory with patency of the segmental vessel maintained. Embolization was not performed at this level. Angiogram of thoracic 8 segmental artery demonstrate a similar early tortuous abnormal artery, with minimal contribution to the infrahilar region. Angiogram of thoracic 9 segmental artery demonstrates no significant contribution. Angiogram of thoracic 10 segmental artery demonstrates no significant contribution. IMPRESSION: Status post angiogram of right bronchial artery and 5 additional right-sided segmental arteries, with embosphere embolization of the right bronchial artery for hemostasis. Status post Exoseal deployment. Signed, Dulcy Fanny. Earleen Newport, DO [facility] Radiology Electronically Signed   By: Corrie Mckusick D.O.   On: 07/18/2015 18:04   Ir Angiogram Pulmonary Right Selective  07/18/2015  INDICATION: 45 year old female with a history of hemoptysis. This is recurrent hemoptysis, with known non-small cell lung carcinoma and previous treatment with radiation therapy. She has been treated in July 2016 for hemoptysis. She has had recurrent bleeding with admission Saturday July 1st, and presents now for repeat angiogram and possible embolization. EXAM: RIGHT PULMONARY ARTERIOGRAPHY; IR ULTRASOUND GUIDANCE VASC ACCESS RIGHT; ADDITIONAL ARTERIOGRAPHY; IR EMBO ART VEN HEMORR LYMPH EXTRAV INC GUIDE ROADMAPPING MEDICATIONS: No additional ANESTHESIA/SEDATION: Moderate (conscious) sedation was employed during this procedure. A total of Versed 4.0 mg and Fentanyl 100 mcg was administered intravenously. Moderate Sedation Time: 68 minutes. The patient's level of consciousness and vital signs were monitored continuously by radiology nursing throughout the procedure under my direct supervision. CONTRAST:  100 cc FLUOROSCOPY  TIME:  Fluoroscopy Time: 23 minutes 24 seconds (989 mGy). COMPLICATIONS: None PROCEDURE: Informed consent was obtained from the patient following explanation of the procedure, risks, benefits and alternatives. The patient understands, agrees and consents for the procedure. All questions were addressed. A time out was performed prior to the initiation of the procedure. Maximal barrier sterile technique utilized including caps, mask, sterile gowns, sterile gloves, large sterile drape, hand hygiene, and Betadine prep. Ultrasound survey of the right inguinal region was performed with images stored and sent to PACs. A micropuncture needle was used access the right common femoral artery under ultrasound. With excellent arterial blood flow returned, and an .018 micro wire was passed  through the needle, observed enter the abdominal aorta under fluoroscopy. The needle was removed, and a micropuncture sheath was placed over the wire. The inner dilator and wire were removed, and an 035 Bentson wire was advanced under fluoroscopy into the abdominal aorta. The sheath was removed and a standard 5 [place] vascular sheath was placed. The dilator was removed and the sheath was flushed. Mickelson catheter was advanced over the Bentson wire into the thoracic aorta. Double flush of the catheter was performed in the proximal descending thoracic aorta. Mickelson catheter was formed over the aortic arch, and the right bronchial artery was selected. Angiogram was performed. Micro catheter arrangement with a high-flow Renegade and 014 Fathom wire was introduced. Micro catheter was advanced beyond the origin of the supreme intercostal artery. Repeat angiogram was performed. Embolization was then performed with 300 - 500 micro meter embosphere. Angiogram was not performed. Micro catheter was withdrawn slightly and a final embolization was performed with 500 -700 micro meter embosphere. Final angiogram performed. The micro catheter system was  removed, and additional segmental artery interrogation was performed. The thoracic 6 right segmental artery was interrogated. After initial angiogram, the micro catheter system was reintroduced, and an attempt at selecting hypertrophied early branch to the right lower lobe was performed. During the attempted catheterization, the artery was in spasm/dissection, and a final angiogram at this segmental vessel demonstrated decreased flow. Thoracic 7 right segmental artery was interrogated. Again, abnormal early tortuous branch was identified with small contribution to the medial region of the lower right lung. Thoracic 8 right segmental artery interrogated. Thoracic 9 right segmental artery interrogated. Thoracic 10 right segmental artery interrogated. Angiogram of the right common femoral artery was performed. Catheters and wires were removed and an Exoseal device was deployed for hemostasis. Patient tolerated the procedure well and remained hemodynamically stable throughout. No complications were encountered and no significant blood loss encountered. FINDINGS: Ultrasound survey of the right inguinal region demonstrates widely pacer right common femoral artery. Initial angiogram of the right bronchial artery at the thoracic 5 level demonstrates significant contribution to the right hilar region and right lower lung via very abnormal vasculature, which have abnormal tortuous anatomy and enlarging diameter as they continue distally from the bronchial artery. The right supreme intercostal artery arises from the apex of the bronchial artery. Status post embolization, there is nearly no flow to the hilar region and right lower lung via this right bronchial artery. Angiogram of thoracic 7 segmental artery demonstrates and early abnormal tortuous branch contributing to the infrahilar right lower lung. During attempted selection with the micro catheter system, the artery was spasm/dissected, with final angiogram demonstrating  decreased flow to this territory with patency of the segmental vessel maintained. Embolization was not performed at this level. Angiogram of thoracic 8 segmental artery demonstrate a similar early tortuous abnormal artery, with minimal contribution to the infrahilar region. Angiogram of thoracic 9 segmental artery demonstrates no significant contribution. Angiogram of thoracic 10 segmental artery demonstrates no significant contribution. IMPRESSION: Status post angiogram of right bronchial artery and 5 additional right-sided segmental arteries, with embosphere embolization of the right bronchial artery for hemostasis. Status post Exoseal deployment. Signed, Dulcy Fanny. Earleen Newport, DO [facility] Radiology Electronically Signed   By: Corrie Mckusick D.O.   On: 07/18/2015 18:04   Ir Angiogram Pulmonary Right Selective  07/18/2015  INDICATION: 45 year old female with a history of hemoptysis. This is recurrent hemoptysis, with known non-small cell lung carcinoma and previous treatment with radiation therapy.  She has been treated in July 2016 for hemoptysis. She has had recurrent bleeding with admission Saturday July 1st, and presents now for repeat angiogram and possible embolization. EXAM: RIGHT PULMONARY ARTERIOGRAPHY; IR ULTRASOUND GUIDANCE VASC ACCESS RIGHT; ADDITIONAL ARTERIOGRAPHY; IR EMBO ART VEN HEMORR LYMPH EXTRAV INC GUIDE ROADMAPPING MEDICATIONS: No additional ANESTHESIA/SEDATION: Moderate (conscious) sedation was employed during this procedure. A total of Versed 4.0 mg and Fentanyl 100 mcg was administered intravenously. Moderate Sedation Time: 68 minutes. The patient's level of consciousness and vital signs were monitored continuously by radiology nursing throughout the procedure under my direct supervision. CONTRAST:  100 cc FLUOROSCOPY TIME:  Fluoroscopy Time: 23 minutes 24 seconds (989 mGy). COMPLICATIONS: None PROCEDURE: Informed consent was obtained from the  patient following explanation of the procedure, risks, benefits and alternatives. The patient understands, agrees and consents for the procedure. All questions were addressed. A time out was performed prior to the initiation of the procedure. Maximal barrier sterile technique utilized including caps, mask, sterile gowns, sterile gloves, large sterile drape, hand hygiene, and Betadine prep. Ultrasound survey of the right inguinal region was performed with images stored and sent to PACs. A micropuncture needle was used access the right common femoral artery under ultrasound. With excellent arterial blood flow returned, and an .018 micro wire was passed through the needle, observed enter the abdominal aorta under fluoroscopy. The needle was removed, and a micropuncture sheath was placed over the wire. The inner dilator and wire were removed, and an 035 Bentson wire was advanced under fluoroscopy into the abdominal aorta. The sheath was removed and a standard 5 [place] vascular sheath was placed. The dilator was removed and the sheath was flushed. Mickelson catheter was advanced over the Bentson wire into the thoracic aorta. Double flush of the catheter was performed in the proximal descending thoracic aorta. Mickelson catheter was formed over the aortic arch, and the right bronchial artery was selected. Angiogram was performed. Micro catheter arrangement with a high-flow Renegade and 014 Fathom wire was introduced. Micro catheter was advanced beyond the origin of the supreme intercostal artery. Repeat angiogram was performed. Embolization was then performed with 300 - 500 micro meter embosphere. Angiogram was not performed. Micro catheter was withdrawn slightly and a final embolization was performed with 500 -700 micro meter embosphere. Final angiogram performed. The micro catheter system was removed, and additional segmental artery interrogation was performed. The thoracic 6 right segmental artery was interrogated.  After initial angiogram, the micro catheter system was reintroduced, and an attempt at selecting hypertrophied early branch to the right lower lobe was performed. During the attempted catheterization, the artery was in spasm/dissection, and a final angiogram at this segmental vessel demonstrated decreased flow. Thoracic 7 right segmental artery was interrogated. Again, abnormal early tortuous branch was identified with small contribution to the medial region of the lower right lung. Thoracic 8 right segmental artery interrogated. Thoracic 9 right segmental artery interrogated. Thoracic 10 right segmental artery interrogated. Angiogram of the right common femoral artery was performed. Catheters and wires were removed and an Exoseal device was deployed for hemostasis. Patient tolerated the procedure well and remained hemodynamically stable throughout. No complications were encountered and no significant blood loss encountered. FINDINGS: Ultrasound survey of the right inguinal region demonstrates widely pacer right common femoral artery. Initial angiogram of the right bronchial artery at the thoracic 5 level demonstrates significant contribution to the right hilar region and right lower lung via very abnormal vasculature, which have abnormal tortuous anatomy and enlarging diameter as they  continue distally from the bronchial artery. The right supreme intercostal artery arises from the apex of the bronchial artery. Status post embolization, there is nearly no flow to the hilar region and right lower lung via this right bronchial artery. Angiogram of thoracic 7 segmental artery demonstrates and early abnormal tortuous branch contributing to the infrahilar right lower lung. During attempted selection with the micro catheter system, the artery was spasm/dissected, with final angiogram demonstrating decreased flow to this territory with patency of the segmental vessel maintained. Embolization was not performed at this  level. Angiogram of thoracic 8 segmental artery demonstrate a similar early tortuous abnormal artery, with minimal contribution to the infrahilar region. Angiogram of thoracic 9 segmental artery demonstrates no significant contribution. Angiogram of thoracic 10 segmental artery demonstrates no significant contribution. IMPRESSION: Status post angiogram of right bronchial artery and 5 additional right-sided segmental arteries, with embosphere embolization of the right bronchial artery for hemostasis. Status post Exoseal deployment. Signed, Dulcy Fanny. Earleen Newport, DO [facility] Radiology Electronically Signed   By: Corrie Mckusick D.O.   On: 07/18/2015 18:04   Ir Angiogram Selective Each Additional Vessel  07/18/2015  INDICATION: 45 year old female with a history of hemoptysis. This is recurrent hemoptysis, with known non-small cell lung carcinoma and previous treatment with radiation therapy. She has been treated in July 2016 for hemoptysis. She has had recurrent bleeding with admission Saturday July 1st, and presents now for repeat angiogram and possible embolization. EXAM: RIGHT PULMONARY ARTERIOGRAPHY; IR ULTRASOUND GUIDANCE VASC ACCESS RIGHT; ADDITIONAL ARTERIOGRAPHY; IR EMBO ART VEN HEMORR LYMPH EXTRAV INC GUIDE ROADMAPPING MEDICATIONS: No additional ANESTHESIA/SEDATION: Moderate (conscious) sedation was employed during this procedure. A total of Versed 4.0 mg and Fentanyl 100 mcg was administered intravenously. Moderate Sedation Time: 68 minutes. The patient's level of consciousness and vital signs were monitored continuously by radiology nursing throughout the procedure under my direct supervision. CONTRAST:  100 cc FLUOROSCOPY TIME:  Fluoroscopy Time: 23 minutes 24 seconds (989 mGy). COMPLICATIONS: None PROCEDURE: Informed consent was obtained from the patient following explanation of the procedure, risks, benefits and alternatives. The patient understands, agrees  and consents for the procedure. All questions were addressed. A time out was performed prior to the initiation of the procedure. Maximal barrier sterile technique utilized including caps, mask, sterile gowns, sterile gloves, large sterile drape, hand hygiene, and Betadine prep. Ultrasound survey of the right inguinal region was performed with images stored and sent to PACs. A micropuncture needle was used access the right common femoral artery under ultrasound. With excellent arterial blood flow returned, and an .018 micro wire was passed through the needle, observed enter the abdominal aorta under fluoroscopy. The needle was removed, and a micropuncture sheath was placed over the wire. The inner dilator and wire were removed, and an 035 Bentson wire was advanced under fluoroscopy into the abdominal aorta. The sheath was removed and a standard 5 [place] vascular sheath was placed. The dilator was removed and the sheath was flushed. Mickelson catheter was advanced over the Bentson wire into the thoracic aorta. Double flush of the catheter was performed in the proximal descending thoracic aorta. Mickelson catheter was formed over the aortic arch, and the right bronchial artery was selected. Angiogram was performed. Micro catheter arrangement with a high-flow Renegade and 014 Fathom wire was introduced. Micro catheter was advanced beyond the origin of the supreme intercostal artery. Repeat angiogram was performed. Embolization was then performed with 300 - 500 micro meter embosphere. Angiogram was not performed. Micro  catheter was withdrawn slightly and a final embolization was performed with 500 -700 micro meter embosphere. Final angiogram performed. The micro catheter system was removed, and additional segmental artery interrogation was performed. The thoracic 6 right segmental artery was interrogated. After initial angiogram, the micro catheter system was reintroduced, and an attempt at selecting hypertrophied early  branch to the right lower lobe was performed. During the attempted catheterization, the artery was in spasm/dissection, and a final angiogram at this segmental vessel demonstrated decreased flow. Thoracic 7 right segmental artery was interrogated. Again, abnormal early tortuous branch was identified with small contribution to the medial region of the lower right lung. Thoracic 8 right segmental artery interrogated. Thoracic 9 right segmental artery interrogated. Thoracic 10 right segmental artery interrogated. Angiogram of the right common femoral artery was performed. Catheters and wires were removed and an Exoseal device was deployed for hemostasis. Patient tolerated the procedure well and remained hemodynamically stable throughout. No complications were encountered and no significant blood loss encountered. FINDINGS: Ultrasound survey of the right inguinal region demonstrates widely pacer right common femoral artery. Initial angiogram of the right bronchial artery at the thoracic 5 level demonstrates significant contribution to the right hilar region and right lower lung via very abnormal vasculature, which have abnormal tortuous anatomy and enlarging diameter as they continue distally from the bronchial artery. The right supreme intercostal artery arises from the apex of the bronchial artery. Status post embolization, there is nearly no flow to the hilar region and right lower lung via this right bronchial artery. Angiogram of thoracic 7 segmental artery demonstrates and early abnormal tortuous branch contributing to the infrahilar right lower lung. During attempted selection with the micro catheter system, the artery was spasm/dissected, with final angiogram demonstrating decreased flow to this territory with patency of the segmental vessel maintained. Embolization was not performed at this level. Angiogram of thoracic 8 segmental artery demonstrate a similar early tortuous abnormal artery, with minimal  contribution to the infrahilar region. Angiogram of thoracic 9 segmental artery demonstrates no significant contribution. Angiogram of thoracic 10 segmental artery demonstrates no significant contribution. IMPRESSION: Status post angiogram of right bronchial artery and 5 additional right-sided segmental arteries, with embosphere embolization of the right bronchial artery for hemostasis. Status post Exoseal deployment. Signed, Dulcy Fanny. Earleen Newport, DO [facility] Radiology Electronically Signed   By: Corrie Mckusick D.O.   On: 07/18/2015 18:04   Dg Esophagus  07/06/2015  CLINICAL DATA:  Lung cancer undergoing radiation therapy. Odynophagia EXAM: ESOPHOGRAM / BARIUM SWALLOW / BARIUM TABLET STUDY TECHNIQUE: Combined double contrast and single contrast examination performed using effervescent crystals, thick barium liquid, and thin barium liquid. The patient was observed with fluoroscopy swallowing a 13 mm barium sulphate tablet. FLUOROSCOPY TIME:  Radiation Exposure Index (as provided by the fluoroscopic device): 9.4 mGy If the device does not provide the exposure index: Fluoroscopy Time:  dictate in minutes and seconds Number of Acquired Images: COMPARISON:  CT chest 06/02/2015 FINDINGS: Normal swallowing function in the high cervical esophagus. No mucosal irregularity within the thoracic esophagus or distal esophagus. No stricture mass within the esophagus. GE junction is normal With patient in prone position, there is poor initiation of the primary stripping wave. Contrast remained in the proximal esophagus with to and fro motion and to the high cervical esophagus and hypopharynx. No tertiary contractions. 13 mm barium tablet passed the GE junction easily. IMPRESSION: 1. Esophageal dysmotility with poor initiation of the primary stripping wave and to and fro  motion of barium column in the high cervical esophagus. No tertiary contractions. 2. No mucosal irregularity  stricture mass in esophagus. 3. Barium tablet passed GE junction easily. Electronically Signed   By: Suzy Bouchard M.D.   On: 07/06/2015 10:40   Ir US Guide Vasc Access Right  07/18/2015  INDICATION: 44 year old female with a history of hemoptysis. This is recurrent hemoptysis, with known non-small cell lung carcinoma and previous treatment with radiation therapy. She has been treated in July 2016 for hemoptysis. She has had recurrent bleeding with admission Saturday July 1st, and presents now for repeat angiogram and possible embolization. EXAM: RIGHT PULMONARY ARTERIOGRAPHY; IR ULTRASOUND GUIDANCE VASC ACCESS RIGHT; ADDITIONAL ARTERIOGRAPHY; IR EMBO ART VEN HEMORR LYMPH EXTRAV INC GUIDE ROADMAPPING MEDICATIONS: No additional ANESTHESIA/SEDATION: Moderate (conscious) sedation was employed during this procedure. A total of Versed 4.0 mg and Fentanyl 100 mcg was administered intravenously. Moderate Sedation Time: 68 minutes. The patient's level of consciousness and vital signs were monitored continuously by radiology nursing throughout the procedure under my direct supervision. CONTRAST:  100 cc FLUOROSCOPY TIME:  Fluoroscopy Time: 23 minutes 24 seconds (989 mGy). COMPLICATIONS: None PROCEDURE: Informed consent was obtained from the patient following explanation of the procedure, risks, benefits and alternatives. The patient understands, agrees and consents for the procedure. All questions were addressed. A time out was performed prior to the initiation of the procedure. Maximal barrier sterile technique utilized including caps, mask, sterile gowns, sterile gloves, large sterile drape, hand hygiene, and Betadine prep. Ultrasound survey of the right inguinal region was performed with images stored and sent to PACs. A micropuncture needle was used access the right common femoral artery under ultrasound. With excellent arterial blood flow returned, and an .018 micro wire was passed through the needle, observed  enter the abdominal aorta under fluoroscopy. The needle was removed, and a micropuncture sheath was placed over the wire. The inner dilator and wire were removed, and an 035 Bentson wire was advanced under fluoroscopy into the abdominal aorta. The sheath was removed and a standard 5 [place] vascular sheath was placed. The dilator was removed and the sheath was flushed. Mickelson catheter was advanced over the Bentson wire into the thoracic aorta. Double flush of the catheter was performed in the proximal descending thoracic aorta. Mickelson catheter was formed over the aortic arch, and the right bronchial artery was selected. Angiogram was performed. Micro catheter arrangement with a high-flow Renegade and 014 Fathom wire was introduced. Micro catheter was advanced beyond the origin of the supreme intercostal artery. Repeat angiogram was performed. Embolization was then performed with 300 - 500 micro meter embosphere. Angiogram was not performed. Micro catheter was withdrawn slightly and a final embolization was performed with 500 -700 micro meter embosphere. Final angiogram performed. The micro catheter system was removed, and additional segmental artery interrogation was performed. The thoracic 6 right segmental artery was interrogated. After initial angiogram, the micro catheter system was reintroduced, and an attempt at selecting hypertrophied early branch to the right lower lobe was performed. During the attempted catheterization, the artery was in spasm/dissection, and a final angiogram at this segmental vessel demonstrated decreased flow. Thoracic 7 right segmental artery was interrogated. Again, abnormal early tortuous branch was identified with small contribution to the medial region of the lower right lung. Thoracic 8 right segmental artery interrogated. Thoracic 9 right segmental artery interrogated. Thoracic 10 right segmental artery interrogated. Angiogram of the right common femoral artery was  performed. Catheters and wires were removed and an Exoseal device  was deployed for hemostasis. Patient tolerated the procedure well and remained hemodynamically stable throughout. No complications were encountered and no significant blood loss encountered. FINDINGS: Ultrasound survey of the right inguinal region demonstrates widely pacer right common femoral artery. Initial angiogram of the right bronchial artery at the thoracic 5 level demonstrates significant contribution to the right hilar region and right lower lung via very abnormal vasculature, which have abnormal tortuous anatomy and enlarging diameter as they continue distally from the bronchial artery. The right supreme intercostal artery arises from the apex of the bronchial artery. Status post embolization, there is nearly no flow to the hilar region and right lower lung via this right bronchial artery. Angiogram of thoracic 7 segmental artery demonstrates and early abnormal tortuous branch contributing to the infrahilar right lower lung. During attempted selection with the micro catheter system, the artery was spasm/dissected, with final angiogram demonstrating decreased flow to this territory with patency of the segmental vessel maintained. Embolization was not performed at this level. Angiogram of thoracic 8 segmental artery demonstrate a similar early tortuous abnormal artery, with minimal contribution to the infrahilar region. Angiogram of thoracic 9 segmental artery demonstrates no significant contribution. Angiogram of thoracic 10 segmental artery demonstrates no significant contribution. IMPRESSION: Status post angiogram of right bronchial artery and 5 additional right-sided segmental arteries, with embosphere embolization of the right bronchial artery for hemostasis. Status post Exoseal deployment. Signed, Dulcy Fanny. Earleen Newport, DO [facility] Radiology Electronically Signed   By: Corrie Mckusick D.O.    On: 07/18/2015 18:04   [place] Guide Roadmapping  07/18/2015  INDICATION: 45 year old female with a history of hemoptysis. This is recurrent hemoptysis, with known non-small cell lung carcinoma and previous treatment with radiation therapy. She has been treated in July 2016 for hemoptysis. She has had recurrent bleeding with admission Saturday July 1st, and presents now for repeat angiogram and possible embolization. EXAM: RIGHT PULMONARY ARTERIOGRAPHY; IR ULTRASOUND GUIDANCE VASC ACCESS RIGHT; ADDITIONAL ARTERIOGRAPHY; IR EMBO ART VEN HEMORR LYMPH EXTRAV INC GUIDE ROADMAPPING MEDICATIONS: No additional ANESTHESIA/SEDATION: Moderate (conscious) sedation was employed during this procedure. A total of Versed 4.0 mg and Fentanyl 100 mcg was administered intravenously. Moderate Sedation Time: 68 minutes. The patient's level of consciousness and vital signs were monitored continuously by radiology nursing throughout the procedure under my direct supervision. CONTRAST:  100 cc FLUOROSCOPY TIME:  Fluoroscopy Time: 23 minutes 24 seconds (989 mGy). COMPLICATIONS: None PROCEDURE: Informed consent was obtained from the patient following explanation of the procedure, risks, benefits and alternatives. The patient understands, agrees and consents for the procedure. All questions were addressed. A time out was performed prior to the initiation of the procedure. Maximal barrier sterile technique utilized including caps, mask, sterile gowns, sterile gloves, large sterile drape, hand hygiene, and Betadine prep. Ultrasound survey of the right inguinal region was performed with images stored and sent to PACs. A micropuncture needle was used access the right common femoral artery under ultrasound. With excellent arterial blood flow returned, and an .018 micro wire was passed through the needle, observed enter the abdominal aorta under fluoroscopy. The needle was removed, and a micropuncture sheath was  placed over the wire. The inner dilator and wire were removed, and an 035 Bentson wire was advanced under fluoroscopy into the abdominal aorta. The sheath was removed and a standard 5 [place] vascular sheath was placed. The dilator was removed and the sheath was flushed. Mickelson catheter was advanced over  the Bentson wire into the thoracic aorta. Double flush of the catheter was performed in the proximal descending thoracic aorta. Mickelson catheter was formed over the aortic arch, and the right bronchial artery was selected. Angiogram was performed. Micro catheter arrangement with a high-flow Renegade and 014 Fathom wire was introduced. Micro catheter was advanced beyond the origin of the supreme intercostal artery. Repeat angiogram was performed. Embolization was then performed with 300 - 500 micro meter embosphere. Angiogram was not performed. Micro catheter was withdrawn slightly and a final embolization was performed with 500 -700 micro meter embosphere. Final angiogram performed. The micro catheter system was removed, and additional segmental artery interrogation was performed. The thoracic 6 right segmental artery was interrogated. After initial angiogram, the micro catheter system was reintroduced, and an attempt at selecting hypertrophied early branch to the right lower lobe was performed. During the attempted catheterization, the artery was in spasm/dissection, and a final angiogram at this segmental vessel demonstrated decreased flow. Thoracic 7 right segmental artery was interrogated. Again, abnormal early tortuous branch was identified with small contribution to the medial region of the lower right lung. Thoracic 8 right segmental artery interrogated. Thoracic 9 right segmental artery interrogated. Thoracic 10 right segmental artery interrogated. Angiogram of the right common femoral artery was performed. Catheters and wires were removed and an Exoseal device was deployed for hemostasis. Patient  tolerated the procedure well and remained hemodynamically stable throughout. No complications were encountered and no significant blood loss encountered. FINDINGS: Ultrasound survey of the right inguinal region demonstrates widely pacer right common femoral artery. Initial angiogram of the right bronchial artery at the thoracic 5 level demonstrates significant contribution to the right hilar region and right lower lung via very abnormal vasculature, which have abnormal tortuous anatomy and enlarging diameter as they continue distally from the bronchial artery. The right supreme intercostal artery arises from the apex of the bronchial artery. Status post embolization, there is nearly no flow to the hilar region and right lower lung via this right bronchial artery. Angiogram of thoracic 7 segmental artery demonstrates and early abnormal tortuous branch contributing to the infrahilar right lower lung. During attempted selection with the micro catheter system, the artery was spasm/dissected, with final angiogram demonstrating decreased flow to this territory with patency of the segmental vessel maintained. Embolization was not performed at this level. Angiogram of thoracic 8 segmental artery demonstrate a similar early tortuous abnormal artery, with minimal contribution to the infrahilar region. Angiogram of thoracic 9 segmental artery demonstrates no significant contribution. Angiogram of thoracic 10 segmental artery demonstrates no significant contribution. IMPRESSION: Status post angiogram of right bronchial artery and 5 additional right-sided segmental arteries, with embosphere embolization of the right bronchial artery for hemostasis. Status post Exoseal deployment. Signed, Dulcy Fanny. Earleen Newport, DO [facility] Radiology Electronically Signed   By: Corrie Mckusick D.O.   On: 07/18/2015 18:04    ASSESSMENT AND PLAN: This is a very pleasant 45 years old Asian female never  smoker with recently diagnosed with stage IV non-small cell lung cancer, adenocarcinoma presented with right middle lobe lung mass in addition to right hilar adenopathy and metastatic disease to the bones, brain and left adrenal gland.  She status post whole brain irradiation with some improvement in her disease on the recent MRI of the brain. The patient completed treatment with Tarceva 150 mg by mouth daily for 5 months and tolerating it fairly well except for grade 2 skin rash and recent episodes of diarrhea. She  is currently on Tarceva 100 mg by mouth daily started 02/19/2015. She is tolerating the lower dose of Tarceva much better. I recommended for her to continue her current treatment with Tarceva 100 mg by mouth daily. She is currently undergoing palliative radiotherapy to the right lower lobe lung mass with improvement of her hemoptysis. For the metastatic bone lesions, she started treatment with Delton See and she is tolerating it well.  She will come back for follow-up visit in one month for reevaluation with repeat blood work. She was advised to call immediately if she has any concerning symptoms in the interval. The patient voices understanding of current disease status and treatment options and is in agreement with the current care plan.  All questions were answered. The patient knows to call the clinic with any problems, questions or concerns. We can certainly see the patient much sooner if necessary.  Disclaimer: This note was dictated with voice recognition software. Similar sounding words can inadvertently be transcribed and may not be corrected upon review.

## 2015-07-28 NOTE — Telephone Encounter (Signed)
per pof to sch pt appt-gave pt copy of avs °

## 2015-07-29 ENCOUNTER — Ambulatory Visit
Admit: 2015-07-29 | Discharge: 2015-07-29 | Disposition: A | Discharge status: Home / Self Care | Payer: Medicaid Other | Attending: Radiation Oncology | Admitting: Radiation Oncology

## 2015-07-29 ENCOUNTER — Encounter: Payer: Self-pay | Admitting: Radiation Oncology

## 2015-07-29 ENCOUNTER — Ambulatory Visit
Admission: RE | Admit: 2015-07-29 | Discharge: 2015-07-29 | Disposition: A | Discharge status: Home / Self Care | Payer: Medicaid Other | Source: Ambulatory Visit | Attending: Radiation Oncology | Admitting: Radiation Oncology

## 2015-07-29 VITALS — BP 103/48 | HR 97 | Temp 98.0°F | Resp 16 | Wt 107.1 lb

## 2015-07-29 DIAGNOSIS — C3431 Malignant neoplasm of lower lobe, right bronchus or lung: Secondary | ICD-10-CM

## 2015-07-29 DIAGNOSIS — Z510 Encounter for antineoplastic radiation therapy: Secondary | ICD-10-CM | POA: Diagnosis not present

## 2015-07-29 MED ORDER — SUCRALFATE 1 G PO TABS: 1.0000 g | ORAL_TABLET | Freq: Three times a day (TID) | ORAL | Status: DC

## 2015-07-29 MED FILL — SUCRALFATE 1 GM TABLET: 1 | 30 days supply | Qty: 120 | Fill #0

## 2015-07-29 NOTE — Progress Notes (Signed)
The patient was seen for under treatment with Dr. Lisbeth Renshaw. She has noticed some dysphagia and we discussed the use of carafate and a new prescription is provided. She will continue to follow up with [place] and through the translator services, she is counseled on the use of this.     Carola Rhine, PAC

## 2015-07-29 NOTE — Progress Notes (Addendum)
Weekly rad txs  Chest 7/10 completed, dry cough, no nausea, takes proven tal inhaler prn, face with rash , no pain, interpreter [place] with patient,  Appetite fair, energy level tired at times, gave 1 month appt with Shona Simpson PA 2:35 PM BP 103/48 mmHg  Pulse 97  Temp(Src) 98 F (36.7 C) (Oral)  Resp 16  Wt 107 lb 1.6 oz (48.58 kg)  SpO2 100%  Wt Readings from Last 3 Encounters:  07/29/15 107 lb 1.6 oz (48.58 kg)  07/28/15 103 lb 3.2 oz (46.811 kg)  07/22/15 104 lb (47.174 kg)

## 2015-07-30 ENCOUNTER — Encounter: Payer: Self-pay | Admitting: Radiation Oncology

## 2015-07-30 MED FILL — TARCEVA 100 MG TABLET: 100 | 30 days supply | Qty: 30 | Fill #1

## 2015-07-30 NOTE — Progress Notes (Signed)
Department of Radiation Oncology  Phone:  [phone] Fax:        [phone]  Weekly Treatment Note    Name: Deborah Foster Date: 07/30/2015 MRN: [id number] DOB: Dec 13, 1970   Diagnosis:     ICD-9-CM ICD-10-CM   1. Primary cancer of right lower lobe of lung (HCC) 162.5 C34.31      Current dose: 21 Gy  Current fraction: 7   MEDICATIONS: Current Outpatient Prescriptions  Medication Sig Dispense Refill  . albuterol (PROVENTIL HFA;VENTOLIN HFA) 108 (90 Base) MCG/ACT inhaler Inhale 2 puffs into the lungs every 6 (six) hours as needed for wheezing or shortness of breath. 1 Inhaler 0  . benzonatate (TESSALON) 100 MG capsule Take 1 capsule (100 mg total) by mouth 3 (three) times daily as needed for cough. 30 capsule 0  . chlorpheniramine-HYDROcodone (TUSSIONEX) 10-8 MG/5ML SUER Take 5 mLs by mouth every 12 (twelve) hours as needed for cough. 140 mL 0  . clindamycin (CLINDAGEL) 1 % gel Apply 1 application topically 2 (two) times daily.    . Cyanocobalamin (VITAMIN B-12) 2500 MCG SUBL Place 2,500 mcg under the tongue daily.    Marland Kitchen erlotinib (TARCEVA) 100 MG tablet Take 1 tablet (100 mg total) by mouth daily. Take on an empty stomach 1 hour before meals or 2 hours after 30 tablet 1  . feeding supplement (BOOST / RESOURCE BREEZE) LIQD Take 1 Container by mouth 2 (two) times daily between meals. 60 Container 0  . ferrous sulfate 325 (65 FE) MG EC tablet Take 325 mg by mouth daily with breakfast.    . folic acid (FOLVITE) 195 MCG tablet Take 1 tablet (800 mcg total) by mouth daily. 30 tablet 1  . loratadine (CLARITIN) 10 MG tablet Take 10 mg by mouth daily.    . Nutritional Supplements (JUICE PLUS FIBRE PO) Take 1 capsule by mouth daily. Reported on 07/28/2015    . TURMERIC PO Take 1 capsule by mouth 2 (two) times daily. Turmeric Superior 750 mg po bid    . Wound Dressings (SONAFINE EX) Apply 1 application topically daily.    . mometasone-formoterol (DULERA) 200-5 MCG/ACT AERO Inhale 2 puffs  into the lungs 2 (two) times daily as needed. Reported on 07/29/2015    . sucralfate (CARAFATE) 1 g tablet Take 1 tablet (1 g total) by mouth 4 (four) times daily -  with meals and at bedtime. 120 tablet 0   No current facility-administered medications for this encounter.     ALLERGIES: Other and Doxycycline   LABORATORY DATA:  Lab Results  Component Value Date   WBC 8.1 07/28/2015   HGB 14.3 07/28/2015   HCT 42.5 07/28/2015   MCV 93.8 07/28/2015   PLT 489* 07/28/2015   Lab Results  Component Value Date   NA 138 07/28/2015   K 3.6 07/28/2015   CL 109 07/19/2015   CO2 26 07/28/2015   Lab Results  Component Value Date   ALT 12 07/28/2015   AST 16 07/28/2015   ALKPHOS 58 07/28/2015   BILITOT 0.67 07/28/2015     NARRATIVE: Stephani T Kizer was seen today for weekly treatment management. The chart was checked and the patient's films were reviewed.  Weekly rad txs  Chest 7/10 completed, dry cough, no nausea, takes proven tal inhaler prn, face with rash , no pain, interpreter [place] with patient,  Appetite fair, energy level tired at times, gave 1 month appt with Shona Simpson PA 8:29 PM BP 103/48 mmHg  Pulse 97  Temp(Src) 98 F (36.7 C) (Oral)  Resp 16  Wt 107 lb 1.6 oz (48.58 kg)  SpO2 100%  Wt Readings from Last 3 Encounters:  07/29/15 107 lb 1.6 oz (48.58 kg)  07/28/15 103 lb 3.2 oz (46.811 kg)  07/22/15 104 lb (47.174 kg)    PHYSICAL EXAMINATION: weight is 107 lb 1.6 oz (48.58 kg). Her oral temperature is 98 F (36.7 C). Her blood pressure is 103/48 and her pulse is 97. Her respiration is 16 and oxygen saturation is 100%.        ASSESSMENT: The patient is doing satisfactorily with treatment. The patient is doing well without hemoptysis currently.  PLAN: We will continue with the patient's radiation treatment as planned.

## 2015-08-01 ENCOUNTER — Ambulatory Visit
Admit: 2015-08-01 | Discharge: 2015-08-01 | Disposition: A | Discharge status: Home / Self Care | Payer: Medicaid Other | Attending: Radiation Oncology | Admitting: Radiation Oncology

## 2015-08-01 DIAGNOSIS — Z510 Encounter for antineoplastic radiation therapy: Secondary | ICD-10-CM | POA: Diagnosis not present

## 2015-08-02 ENCOUNTER — Encounter: Payer: Self-pay | Admitting: Nutrition

## 2015-08-02 ENCOUNTER — Ambulatory Visit
Admit: 2015-08-02 | Discharge: 2015-08-02 | Disposition: A | Discharge status: Home / Self Care | Payer: Medicaid Other | Attending: Radiation Oncology | Admitting: Radiation Oncology

## 2015-08-02 DIAGNOSIS — Z510 Encounter for antineoplastic radiation therapy: Secondary | ICD-10-CM | POA: Diagnosis not present

## 2015-08-02 NOTE — Progress Notes (Signed)
Patient was given 1 complementary case of Ensure Plus.

## 2015-08-03 ENCOUNTER — Inpatient Hospital Stay
Admission: RE | Admit: 2015-08-03 | Discharge: 2015-08-03 | Disposition: A | Discharge status: Home / Self Care | Payer: Medicaid Other | Source: Ambulatory Visit | Attending: Radiation Oncology | Admitting: Radiation Oncology

## 2015-08-03 ENCOUNTER — Ambulatory Visit
Admit: 2015-08-03 | Discharge: 2015-08-03 | Disposition: A | Discharge status: Home / Self Care | Payer: Medicaid Other | Attending: Radiation Oncology | Admitting: Radiation Oncology

## 2015-08-03 ENCOUNTER — Encounter: Payer: Self-pay | Admitting: Radiation Oncology

## 2015-08-03 DIAGNOSIS — Z510 Encounter for antineoplastic radiation therapy: Secondary | ICD-10-CM | POA: Diagnosis not present

## 2015-08-11 ENCOUNTER — Ambulatory Visit (HOSPITAL_COMMUNITY): Admission: RE | Admit: 2015-08-11 | Payer: Medicaid Other | Source: Ambulatory Visit

## 2015-08-11 ENCOUNTER — Ambulatory Visit (HOSPITAL_COMMUNITY)
Admission: RE | Admit: 2015-08-11 | Discharge: 2015-08-11 | Disposition: A | Discharge status: Home / Self Care | Payer: Medicaid Other | Source: Ambulatory Visit | Attending: Radiation Oncology | Admitting: Radiation Oncology

## 2015-08-11 ENCOUNTER — Other Ambulatory Visit: Payer: Self-pay | Admitting: Radiation Oncology

## 2015-08-11 DIAGNOSIS — C7951 Secondary malignant neoplasm of bone: Secondary | ICD-10-CM | POA: Diagnosis not present

## 2015-08-11 DIAGNOSIS — C3431 Malignant neoplasm of lower lobe, right bronchus or lung: Secondary | ICD-10-CM

## 2015-08-11 DIAGNOSIS — R600 Localized edema: Secondary | ICD-10-CM | POA: Diagnosis not present

## 2015-08-11 DIAGNOSIS — R9089 Other abnormal findings on diagnostic imaging of central nervous system: Secondary | ICD-10-CM | POA: Diagnosis not present

## 2015-08-11 MED ORDER — GADOBENATE DIMEGLUMINE 529 MG/ML IV SOLN
10.0000 mL | Freq: Once | INTRAVENOUS | Status: AC | PRN
  Administered 2015-08-11: 10 mL via INTRAVENOUS

## 2015-08-12 ENCOUNTER — Ambulatory Visit: Payer: Self-pay | Admitting: Radiation Oncology

## 2015-08-15 ENCOUNTER — Ambulatory Visit
Admission: RE | Admit: 2015-08-15 | Discharge: 2015-08-15 | Disposition: A | Discharge status: Home / Self Care | Payer: Medicaid Other | Source: Ambulatory Visit | Attending: Radiation Oncology | Admitting: Radiation Oncology

## 2015-08-15 ENCOUNTER — Encounter: Payer: Self-pay | Admitting: Radiation Oncology

## 2015-08-15 VITALS — BP 103/59 | HR 103 | Temp 97.5°F | Ht 60.0 in | Wt 105.4 lb

## 2015-08-15 DIAGNOSIS — C3431 Malignant neoplasm of lower lobe, right bronchus or lung: Secondary | ICD-10-CM | POA: Insufficient documentation

## 2015-08-15 DIAGNOSIS — R042 Hemoptysis: Secondary | ICD-10-CM | POA: Diagnosis not present

## 2015-08-15 DIAGNOSIS — Y842 Radiological procedure and radiotherapy as the cause of abnormal reaction of the patient, or of later complication, without mention of misadventure at the time of the procedure: Secondary | ICD-10-CM | POA: Diagnosis not present

## 2015-08-15 NOTE — Progress Notes (Addendum)
Ms. Whittlesey here for results of her recent MR Brain.  She reports intermittent headaches.  She denies any ataxia, no changes in fine motor movement, and no vision changes.  Note facial rash from Tarceva and has mild blister on lower lip-left.  Asking for Magic Mouthwash.  BP (!) 103/59 (BP Location: Left Arm, Patient Position: Sitting, Cuff Size: Normal)   Pulse (!) 103   Temp 97.5 F (36.4 C)   Ht 5' (1.524 m)   Wt 105 lb 6.4 oz (47.8 kg)   BMI 20.58 kg/m    Wt Readings from Last 3 Encounters:  07/29/15 107 lb 1.6 oz (48.6 kg)  07/28/15 103 lb 3.2 oz (46.8 kg)  07/22/15 104 lb (47.2 kg)

## 2015-08-15 NOTE — Progress Notes (Signed)
Radiation Oncology         [phone] ________________________________  Name: Deborah Foster MRN: [id number]  Date: 08/15/2015  DOB: 1970/03/12  Follow-Up Visit Note  CC: [name], MD  [name], *  Diagnosis:   ICD-9-CM ICD-10-CM   1. Primary lung adenocarcinoma, right (HCC) 162.9 C34.91 SONAFINE emulsion 1 application  2. Primary cancer of right lower lobe of lung (HCC) 162.5 C34.31     Interval Since Last Radiation: 2 weeks. Completed radiation 08/03/2015 to the chest to a prescription dose of 30 Gy in 10 fractions of 3 Gy.  Narrative:  The patient returns today for routine follow-up for results of her recent brain MRI. She reports intermittent headaches.  She denies any ataxia, no changes in fine motor movement, and no vision changes.  Note facial rash from Tarceva and has mild blister on lower lip-left.  Asking for Magic Mouthwash. She mentions that she is breathing alright but she has some back pain now. She mentions her back hurts when she coughs. She denies hemoptysis. She mentions she is coughing less than she was before treatment.   She had an MRI of the brain 08/11/2015, revealing improvement in mild edema around the left frontal lesion and no progression of metastatic disease.  ALLERGIES:  is allergic to other and doxycycline.  Meds: Current Outpatient Prescriptions  Medication Sig Dispense Refill  . albuterol (PROVENTIL HFA;VENTOLIN HFA) 108 (90 Base) MCG/ACT inhaler Inhale 2 puffs into the lungs every 6 (six) hours as needed for wheezing or shortness of breath. 1 Inhaler 0  . benzonatate (TESSALON) 100 MG capsule Take 1 capsule (100 mg total) by mouth 3 (three) times daily as needed for cough. 30 capsule 0  . chlorpheniramine-HYDROcodone (TUSSIONEX) 10-8 MG/5ML SUER Take 5 mLs by mouth every 12 (twelve) hours as needed for cough. 140 mL 0  . clindamycin (CLINDAGEL) 1 % gel Apply 1 application topically 2 (two) times daily.    . Cyanocobalamin (VITAMIN  B-12) 2500 MCG SUBL Place 2,500 mcg under the tongue daily.    Marland Kitchen ENSURE (ENSURE) Take 1 Can by mouth.    . erlotinib (TARCEVA) 100 MG tablet Take 1 tablet (100 mg total) by mouth daily. Take on an empty stomach 1 hour before meals or 2 hours after 30 tablet 1  . ferrous sulfate 325 (65 FE) MG EC tablet Take 325 mg by mouth daily with breakfast.    . folic acid (FOLVITE) 416 MCG tablet Take 1 tablet (800 mcg total) by mouth daily. 30 tablet 1  . loratadine (CLARITIN) 10 MG tablet Take 10 mg by mouth daily.    . mometasone-formoterol (DULERA) 200-5 MCG/ACT AERO Inhale 2 puffs into the lungs 2 (two) times daily as needed. Reported on 07/29/2015    . Nutritional Supplements (JUICE PLUS FIBRE PO) Take 1 capsule by mouth daily. Reported on 07/28/2015    . sucralfate (CARAFATE) 1 g tablet Take 1 tablet (1 g total) by mouth 4 (four) times daily -  with meals and at bedtime. 120 tablet 0  . TURMERIC PO Take 1 capsule by mouth 2 (two) times daily. Turmeric Superior 750 mg po bid    . Wound Dressings (SONAFINE EX) Apply 1 application topically daily.     No current facility-administered medications for this encounter.     Physical Findings: The patient is in no acute distress. Patient is alert and oriented.  height is 5' (1.524 m) and weight is 105 lb 6.4 oz (47.8 kg). Her  temperature is 97.5 F (36.4 C). Her blood pressure is 103/59 (abnormal) and her pulse is 103 (abnormal). . Patient was non-tender in an area of discomfort which is to the right of the spine in the lower posterior chest.  Lab Findings: Lab Results  Component Value Date   WBC 8.1 07/28/2015   HGB 14.3 07/28/2015   HCT 42.5 07/28/2015   MCV 93.8 07/28/2015   PLT 489 (H) 07/28/2015     Radiographic Findings: Dg Chest 2 View  Result Date: 07/18/2015 CLINICAL DATA:  Acute onset of hemoptysis. Personal history of lung cancer. Initial encounter. EXAM: CHEST  2 VIEW COMPARISON:  Chest radiograph performed 06/20/2015, and CTA of the  chest performed 06/02/2015 FINDINGS: Right perihilar airspace opacification is grossly unchanged in appearance. The left lung remains relatively clear. No pleural effusion or pneumothorax is seen. The heart is normal in size. No acute osseous abnormalities identified. IMPRESSION: Right perihilar airspace opacification again noted, stable in appearance, likely reflecting posttreatment change, though if the patient's hemoptysis persists, bronchoscopy could be considered for further evaluation, as suggested on prior CTA. Electronically Signed   By: Garald Balding M.D.   On: 07/18/2015 02:27   Mr Jeri Cos ZJ Contrast  Result Date: 08/11/2015 CLINICAL DATA:  Metastatic lung cancer.  Stereotactic radiosurgery EXAM: MRI HEAD WITHOUT AND WITH CONTRAST TECHNIQUE: Multiplanar, multiecho pulse sequences of the brain and surrounding structures were obtained without and with intravenous contrast. CONTRAST:  37m MULTIHANCE GADOBENATE DIMEGLUMINE 529 MG/ML IV SOLN COMPARISON:  MRI 05/05/2015 FINDINGS: Multiple hemorrhagic metastatic deposits are stable. These show evidence of prior hemorrhage and mild enhancement. There is improvement in edema surrounding the left frontal lesion. No new areas of edema or enhancement New area of hyperintensity in the left frontal white matter over the convexity. This measures under 1 cm and was not present previously. This area does not enhance or show hemorrhage. This does show hyperintensity on diffusion imaging and appears to show mild restricted diffusion suggesting this most likely is subacute infarct. Correlate with clinical symptoms. Continued follow-up MRI is suggested however if this were metastatic disease it should enhance given the size of the lesion. Ventricle size normal. Negative for hydrocephalus. Normal cerebral volume. Pituitary normal in size. Circle of Willis patent. Normal orbital structures. IMPRESSION: Multiple hemorrhagic metastatic deposits are unchanged. Improvement in  mild edema around the left frontal lesion New sub cm area of hyperintensity in the left frontal white matter without abnormal enhancement. This most likely is an area of subacute infarction. Electronically Signed   By: CFranchot GalloM.D.   On: 08/11/2015 17:51  Ir Angiogram Pulmonary Right Selective  Result Date: 07/18/2015 INDICATION: 45year old female with a history of hemoptysis. This is recurrent hemoptysis, with known non-small cell lung carcinoma and previous treatment with radiation therapy. She has been treated in July 2016 for hemoptysis. She has had recurrent bleeding with admission Saturday July 1st, and presents now for repeat angiogram and possible embolization. EXAM: RIGHT PULMONARY ARTERIOGRAPHY; IR ULTRASOUND GUIDANCE VASC ACCESS RIGHT; ADDITIONAL ARTERIOGRAPHY; IR EMBO ART VEN HEMORR LYMPH EXTRAV INC GUIDE ROADMAPPING MEDICATIONS: No additional ANESTHESIA/SEDATION: Moderate (conscious) sedation was employed during this procedure. A total of Versed 4.0 mg and Fentanyl 100 mcg was administered intravenously. Moderate Sedation Time: 68 minutes. The patient's level of consciousness and vital signs were monitored continuously by radiology nursing throughout the procedure under my direct supervision. CONTRAST:  100 cc FLUOROSCOPY TIME:  Fluoroscopy Time: 23 minutes 24 seconds (989 mGy). COMPLICATIONS: None PROCEDURE: Informed consent  was obtained from the patient following explanation of the procedure, risks, benefits and alternatives. The patient understands, agrees and consents for the procedure. All questions were addressed. A time out was performed prior to the initiation of the procedure. Maximal barrier sterile technique utilized including caps, mask, sterile gowns, sterile gloves, large sterile drape, hand hygiene, and Betadine prep. Ultrasound survey of the right inguinal region was performed with images stored and sent to PACs. A micropuncture needle was used access the right common femoral  artery under ultrasound. With excellent arterial blood flow returned, and an .018 micro wire was passed through the needle, observed enter the abdominal aorta under fluoroscopy. The needle was removed, and a micropuncture sheath was placed over the wire. The inner dilator and wire were removed, and an 035 Bentson wire was advanced under fluoroscopy into the abdominal aorta. The sheath was removed and a standard 5 [place] vascular sheath was placed. The dilator was removed and the sheath was flushed. Mickelson catheter was advanced over the Bentson wire into the thoracic aorta. Double flush of the catheter was performed in the proximal descending thoracic aorta. Mickelson catheter was formed over the aortic arch, and the right bronchial artery was selected. Angiogram was performed. Micro catheter arrangement with a high-flow Renegade and 014 Fathom wire was introduced. Micro catheter was advanced beyond the origin of the supreme intercostal artery. Repeat angiogram was performed. Embolization was then performed with 300 - 500 micro meter embosphere. Angiogram was not performed. Micro catheter was withdrawn slightly and a final embolization was performed with 500 -700 micro meter embosphere. Final angiogram performed. The micro catheter system was removed, and additional segmental artery interrogation was performed. The thoracic 6 right segmental artery was interrogated. After initial angiogram, the micro catheter system was reintroduced, and an attempt at selecting hypertrophied early branch to the right lower lobe was performed. During the attempted catheterization, the artery was in spasm/dissection, and a final angiogram at this segmental vessel demonstrated decreased flow. Thoracic 7 right segmental artery was interrogated. Again, abnormal early tortuous branch was identified with small contribution to the medial region of the lower right lung. Thoracic 8 right segmental artery interrogated. Thoracic 9 right  segmental artery interrogated. Thoracic 10 right segmental artery interrogated. Angiogram of the right common femoral artery was performed. Catheters and wires were removed and an Exoseal device was deployed for hemostasis. Patient tolerated the procedure well and remained hemodynamically stable throughout. No complications were encountered and no significant blood loss encountered. FINDINGS: Ultrasound survey of the right inguinal region demonstrates widely pacer right common femoral artery. Initial angiogram of the right bronchial artery at the thoracic 5 level demonstrates significant contribution to the right hilar region and right lower lung via very abnormal vasculature, which have abnormal tortuous anatomy and enlarging diameter as they continue distally from the bronchial artery. The right supreme intercostal artery arises from the apex of the bronchial artery. Status post embolization, there is nearly no flow to the hilar region and right lower lung via this right bronchial artery. Angiogram of thoracic 7 segmental artery demonstrates and early abnormal tortuous branch contributing to the infrahilar right lower lung. During attempted selection with the micro catheter system, the artery was spasm/dissected, with final angiogram demonstrating decreased flow to this territory with patency of the segmental vessel maintained. Embolization was not performed at this level. Angiogram of thoracic 8 segmental artery demonstrate a similar early tortuous abnormal artery, with minimal contribution to the infrahilar region. Angiogram of thoracic 9 segmental artery demonstrates  no significant contribution. Angiogram of thoracic 10 segmental artery demonstrates no significant contribution. IMPRESSION: Status post angiogram of right bronchial artery and 5 additional right-sided segmental arteries, with embosphere embolization of the right bronchial artery for hemostasis. Status post Exoseal deployment. Signed, Dulcy Fanny.  Earleen Newport, DO [facility] Radiology Electronically Signed   By: Corrie Mckusick D.O.   On: 07/18/2015 18:04   Ir Angiogram Pulmonary Right Selective  Result Date: 07/18/2015 INDICATION: 45 year old female with a history of hemoptysis. This is recurrent hemoptysis, with known non-small cell lung carcinoma and previous treatment with radiation therapy. She has been treated in July 2016 for hemoptysis. She has had recurrent bleeding with admission Saturday July 1st, and presents now for repeat angiogram and possible embolization. EXAM: RIGHT PULMONARY ARTERIOGRAPHY; IR ULTRASOUND GUIDANCE VASC ACCESS RIGHT; ADDITIONAL ARTERIOGRAPHY; IR EMBO ART VEN HEMORR LYMPH EXTRAV INC GUIDE ROADMAPPING MEDICATIONS: No additional ANESTHESIA/SEDATION: Moderate (conscious) sedation was employed during this procedure. A total of Versed 4.0 mg and Fentanyl 100 mcg was administered intravenously. Moderate Sedation Time: 68 minutes. The patient's level of consciousness and vital signs were monitored continuously by radiology nursing throughout the procedure under my direct supervision. CONTRAST:  100 cc FLUOROSCOPY TIME:  Fluoroscopy Time: 23 minutes 24 seconds (989 mGy). COMPLICATIONS: None PROCEDURE: Informed consent was obtained from the patient following explanation of the procedure, risks, benefits and alternatives. The patient understands, agrees and consents for the procedure. All questions were addressed. A time out was performed prior to the initiation of the procedure. Maximal barrier sterile technique utilized including caps, mask, sterile gowns, sterile gloves, large sterile drape, hand hygiene, and Betadine prep. Ultrasound survey of the right inguinal region was performed with images stored and sent to PACs. A micropuncture needle was used access the right common femoral artery under ultrasound. With excellent arterial blood flow returned, and an .018 micro wire was passed  through the needle, observed enter the abdominal aorta under fluoroscopy. The needle was removed, and a micropuncture sheath was placed over the wire. The inner dilator and wire were removed, and an 035 Bentson wire was advanced under fluoroscopy into the abdominal aorta. The sheath was removed and a standard 5 [place] vascular sheath was placed. The dilator was removed and the sheath was flushed. Mickelson catheter was advanced over the Bentson wire into the thoracic aorta. Double flush of the catheter was performed in the proximal descending thoracic aorta. Mickelson catheter was formed over the aortic arch, and the right bronchial artery was selected. Angiogram was performed. Micro catheter arrangement with a high-flow Renegade and 014 Fathom wire was introduced. Micro catheter was advanced beyond the origin of the supreme intercostal artery. Repeat angiogram was performed. Embolization was then performed with 300 - 500 micro meter embosphere. Angiogram was not performed. Micro catheter was withdrawn slightly and a final embolization was performed with 500 -700 micro meter embosphere. Final angiogram performed. The micro catheter system was removed, and additional segmental artery interrogation was performed. The thoracic 6 right segmental artery was interrogated. After initial angiogram, the micro catheter system was reintroduced, and an attempt at selecting hypertrophied early branch to the right lower lobe was performed. During the attempted catheterization, the artery was in spasm/dissection, and a final angiogram at this segmental vessel demonstrated decreased flow. Thoracic 7 right segmental artery was interrogated. Again, abnormal early tortuous branch was identified with small contribution to the medial region of the lower right lung. Thoracic 8 right segmental artery interrogated. Thoracic 9 right segmental artery  interrogated. Thoracic 10 right segmental artery interrogated. Angiogram of the right common  femoral artery was performed. Catheters and wires were removed and an Exoseal device was deployed for hemostasis. Patient tolerated the procedure well and remained hemodynamically stable throughout. No complications were encountered and no significant blood loss encountered. FINDINGS: Ultrasound survey of the right inguinal region demonstrates widely pacer right common femoral artery. Initial angiogram of the right bronchial artery at the thoracic 5 level demonstrates significant contribution to the right hilar region and right lower lung via very abnormal vasculature, which have abnormal tortuous anatomy and enlarging diameter as they continue distally from the bronchial artery. The right supreme intercostal artery arises from the apex of the bronchial artery. Status post embolization, there is nearly no flow to the hilar region and right lower lung via this right bronchial artery. Angiogram of thoracic 7 segmental artery demonstrates and early abnormal tortuous branch contributing to the infrahilar right lower lung. During attempted selection with the micro catheter system, the artery was spasm/dissected, with final angiogram demonstrating decreased flow to this territory with patency of the segmental vessel maintained. Embolization was not performed at this level. Angiogram of thoracic 8 segmental artery demonstrate a similar early tortuous abnormal artery, with minimal contribution to the infrahilar region. Angiogram of thoracic 9 segmental artery demonstrates no significant contribution. Angiogram of thoracic 10 segmental artery demonstrates no significant contribution. IMPRESSION: Status post angiogram of right bronchial artery and 5 additional right-sided segmental arteries, with embosphere embolization of the right bronchial artery for hemostasis. Status post Exoseal deployment. Signed, Dulcy Fanny. Earleen Newport, DO [facility] Radiology Electronically Signed   By:  Corrie Mckusick D.O.   On: 07/18/2015 18:04   Ir Angiogram Pulmonary Right Selective  Result Date: 07/18/2015 INDICATION: 45 year old female with a history of hemoptysis. This is recurrent hemoptysis, with known non-small cell lung carcinoma and previous treatment with radiation therapy. She has been treated in July 2016 for hemoptysis. She has had recurrent bleeding with admission Saturday July 1st, and presents now for repeat angiogram and possible embolization. EXAM: RIGHT PULMONARY ARTERIOGRAPHY; IR ULTRASOUND GUIDANCE VASC ACCESS RIGHT; ADDITIONAL ARTERIOGRAPHY; IR EMBO ART VEN HEMORR LYMPH EXTRAV INC GUIDE ROADMAPPING MEDICATIONS: No additional ANESTHESIA/SEDATION: Moderate (conscious) sedation was employed during this procedure. A total of Versed 4.0 mg and Fentanyl 100 mcg was administered intravenously. Moderate Sedation Time: 68 minutes. The patient's level of consciousness and vital signs were monitored continuously by radiology nursing throughout the procedure under my direct supervision. CONTRAST:  100 cc FLUOROSCOPY TIME:  Fluoroscopy Time: 23 minutes 24 seconds (989 mGy). COMPLICATIONS: None PROCEDURE: Informed consent was obtained from the patient following explanation of the procedure, risks, benefits and alternatives. The patient understands, agrees and consents for the procedure. All questions were addressed. A time out was performed prior to the initiation of the procedure. Maximal barrier sterile technique utilized including caps, mask, sterile gowns, sterile gloves, large sterile drape, hand hygiene, and Betadine prep. Ultrasound survey of the right inguinal region was performed with images stored and sent to PACs. A micropuncture needle was used access the right common femoral artery under ultrasound. With excellent arterial blood flow returned, and an .018 micro wire was passed through the needle, observed enter the abdominal aorta under fluoroscopy. The needle was removed, and a  micropuncture sheath was placed over the wire. The inner dilator and wire were removed, and an 035 Bentson wire was advanced under fluoroscopy into the abdominal aorta. The sheath was removed and a standard  5 [place] vascular sheath was placed. The dilator was removed and the sheath was flushed. Mickelson catheter was advanced over the Bentson wire into the thoracic aorta. Double flush of the catheter was performed in the proximal descending thoracic aorta. Mickelson catheter was formed over the aortic arch, and the right bronchial artery was selected. Angiogram was performed. Micro catheter arrangement with a high-flow Renegade and 014 Fathom wire was introduced. Micro catheter was advanced beyond the origin of the supreme intercostal artery. Repeat angiogram was performed. Embolization was then performed with 300 - 500 micro meter embosphere. Angiogram was not performed. Micro catheter was withdrawn slightly and a final embolization was performed with 500 -700 micro meter embosphere. Final angiogram performed. The micro catheter system was removed, and additional segmental artery interrogation was performed. The thoracic 6 right segmental artery was interrogated. After initial angiogram, the micro catheter system was reintroduced, and an attempt at selecting hypertrophied early branch to the right lower lobe was performed. During the attempted catheterization, the artery was in spasm/dissection, and a final angiogram at this segmental vessel demonstrated decreased flow. Thoracic 7 right segmental artery was interrogated. Again, abnormal early tortuous branch was identified with small contribution to the medial region of the lower right lung. Thoracic 8 right segmental artery interrogated. Thoracic 9 right segmental artery interrogated. Thoracic 10 right segmental artery interrogated. Angiogram of the right common femoral artery was performed. Catheters and wires were removed and an Exoseal device was deployed for  hemostasis. Patient tolerated the procedure well and remained hemodynamically stable throughout. No complications were encountered and no significant blood loss encountered. FINDINGS: Ultrasound survey of the right inguinal region demonstrates widely pacer right common femoral artery. Initial angiogram of the right bronchial artery at the thoracic 5 level demonstrates significant contribution to the right hilar region and right lower lung via very abnormal vasculature, which have abnormal tortuous anatomy and enlarging diameter as they continue distally from the bronchial artery. The right supreme intercostal artery arises from the apex of the bronchial artery. Status post embolization, there is nearly no flow to the hilar region and right lower lung via this right bronchial artery. Angiogram of thoracic 7 segmental artery demonstrates and early abnormal tortuous branch contributing to the infrahilar right lower lung. During attempted selection with the micro catheter system, the artery was spasm/dissected, with final angiogram demonstrating decreased flow to this territory with patency of the segmental vessel maintained. Embolization was not performed at this level. Angiogram of thoracic 8 segmental artery demonstrate a similar early tortuous abnormal artery, with minimal contribution to the infrahilar region. Angiogram of thoracic 9 segmental artery demonstrates no significant contribution. Angiogram of thoracic 10 segmental artery demonstrates no significant contribution. IMPRESSION: Status post angiogram of right bronchial artery and 5 additional right-sided segmental arteries, with embosphere embolization of the right bronchial artery for hemostasis. Status post Exoseal deployment. Signed, Dulcy Fanny. Earleen Newport, DO [facility] Radiology Electronically Signed   By: Corrie Mckusick D.O.   On: 07/18/2015 18:04   Ir Angiogram Pulmonary Right Selective  Result Date:  07/18/2015 INDICATION: 45 year old female with a history of hemoptysis. This is recurrent hemoptysis, with known non-small cell lung carcinoma and previous treatment with radiation therapy. She has been treated in July 2016 for hemoptysis. She has had recurrent bleeding with admission Saturday July 1st, and presents now for repeat angiogram and possible embolization. EXAM: RIGHT PULMONARY ARTERIOGRAPHY; IR ULTRASOUND GUIDANCE VASC ACCESS RIGHT; ADDITIONAL ARTERIOGRAPHY; IR EMBO ART VEN HEMORR LYMPH EXTRAV INC GUIDE  ROADMAPPING MEDICATIONS: No additional ANESTHESIA/SEDATION: Moderate (conscious) sedation was employed during this procedure. A total of Versed 4.0 mg and Fentanyl 100 mcg was administered intravenously. Moderate Sedation Time: 68 minutes. The patient's level of consciousness and vital signs were monitored continuously by radiology nursing throughout the procedure under my direct supervision. CONTRAST:  100 cc FLUOROSCOPY TIME:  Fluoroscopy Time: 23 minutes 24 seconds (989 mGy). COMPLICATIONS: None PROCEDURE: Informed consent was obtained from the patient following explanation of the procedure, risks, benefits and alternatives. The patient understands, agrees and consents for the procedure. All questions were addressed. A time out was performed prior to the initiation of the procedure. Maximal barrier sterile technique utilized including caps, mask, sterile gowns, sterile gloves, large sterile drape, hand hygiene, and Betadine prep. Ultrasound survey of the right inguinal region was performed with images stored and sent to PACs. A micropuncture needle was used access the right common femoral artery under ultrasound. With excellent arterial blood flow returned, and an .018 micro wire was passed through the needle, observed enter the abdominal aorta under fluoroscopy. The needle was removed, and a micropuncture sheath was placed over the wire. The inner dilator and wire were removed, and an 035 Bentson wire  was advanced under fluoroscopy into the abdominal aorta. The sheath was removed and a standard 5 [place] vascular sheath was placed. The dilator was removed and the sheath was flushed. Mickelson catheter was advanced over the Bentson wire into the thoracic aorta. Double flush of the catheter was performed in the proximal descending thoracic aorta. Mickelson catheter was formed over the aortic arch, and the right bronchial artery was selected. Angiogram was performed. Micro catheter arrangement with a high-flow Renegade and 014 Fathom wire was introduced. Micro catheter was advanced beyond the origin of the supreme intercostal artery. Repeat angiogram was performed. Embolization was then performed with 300 - 500 micro meter embosphere. Angiogram was not performed. Micro catheter was withdrawn slightly and a final embolization was performed with 500 -700 micro meter embosphere. Final angiogram performed. The micro catheter system was removed, and additional segmental artery interrogation was performed. The thoracic 6 right segmental artery was interrogated. After initial angiogram, the micro catheter system was reintroduced, and an attempt at selecting hypertrophied early branch to the right lower lobe was performed. During the attempted catheterization, the artery was in spasm/dissection, and a final angiogram at this segmental vessel demonstrated decreased flow. Thoracic 7 right segmental artery was interrogated. Again, abnormal early tortuous branch was identified with small contribution to the medial region of the lower right lung. Thoracic 8 right segmental artery interrogated. Thoracic 9 right segmental artery interrogated. Thoracic 10 right segmental artery interrogated. Angiogram of the right common femoral artery was performed. Catheters and wires were removed and an Exoseal device was deployed for hemostasis. Patient tolerated the procedure well and remained hemodynamically stable throughout. No complications  were encountered and no significant blood loss encountered. FINDINGS: Ultrasound survey of the right inguinal region demonstrates widely pacer right common femoral artery. Initial angiogram of the right bronchial artery at the thoracic 5 level demonstrates significant contribution to the right hilar region and right lower lung via very abnormal vasculature, which have abnormal tortuous anatomy and enlarging diameter as they continue distally from the bronchial artery. The right supreme intercostal artery arises from the apex of the bronchial artery. Status post embolization, there is nearly no flow to the hilar region and right lower lung via this right bronchial artery. Angiogram of thoracic 7 segmental artery demonstrates and early  abnormal tortuous branch contributing to the infrahilar right lower lung. During attempted selection with the micro catheter system, the artery was spasm/dissected, with final angiogram demonstrating decreased flow to this territory with patency of the segmental vessel maintained. Embolization was not performed at this level. Angiogram of thoracic 8 segmental artery demonstrate a similar early tortuous abnormal artery, with minimal contribution to the infrahilar region. Angiogram of thoracic 9 segmental artery demonstrates no significant contribution. Angiogram of thoracic 10 segmental artery demonstrates no significant contribution. IMPRESSION: Status post angiogram of right bronchial artery and 5 additional right-sided segmental arteries, with embosphere embolization of the right bronchial artery for hemostasis. Status post Exoseal deployment. Signed, Dulcy Fanny. Earleen Newport, DO [facility] Radiology Electronically Signed   By: Corrie Mckusick D.O.   On: 07/18/2015 18:04   Ir Angiogram Pulmonary Right Selective  Result Date: 07/18/2015 INDICATION: 45 year old female with a history of hemoptysis. This is recurrent hemoptysis, with known  non-small cell lung carcinoma and previous treatment with radiation therapy. She has been treated in July 2016 for hemoptysis. She has had recurrent bleeding with admission Saturday July 1st, and presents now for repeat angiogram and possible embolization. EXAM: RIGHT PULMONARY ARTERIOGRAPHY; IR ULTRASOUND GUIDANCE VASC ACCESS RIGHT; ADDITIONAL ARTERIOGRAPHY; IR EMBO ART VEN HEMORR LYMPH EXTRAV INC GUIDE ROADMAPPING MEDICATIONS: No additional ANESTHESIA/SEDATION: Moderate (conscious) sedation was employed during this procedure. A total of Versed 4.0 mg and Fentanyl 100 mcg was administered intravenously. Moderate Sedation Time: 68 minutes. The patient's level of consciousness and vital signs were monitored continuously by radiology nursing throughout the procedure under my direct supervision. CONTRAST:  100 cc FLUOROSCOPY TIME:  Fluoroscopy Time: 23 minutes 24 seconds (989 mGy). COMPLICATIONS: None PROCEDURE: Informed consent was obtained from the patient following explanation of the procedure, risks, benefits and alternatives. The patient understands, agrees and consents for the procedure. All questions were addressed. A time out was performed prior to the initiation of the procedure. Maximal barrier sterile technique utilized including caps, mask, sterile gowns, sterile gloves, large sterile drape, hand hygiene, and Betadine prep. Ultrasound survey of the right inguinal region was performed with images stored and sent to PACs. A micropuncture needle was used access the right common femoral artery under ultrasound. With excellent arterial blood flow returned, and an .018 micro wire was passed through the needle, observed enter the abdominal aorta under fluoroscopy. The needle was removed, and a micropuncture sheath was placed over the wire. The inner dilator and wire were removed, and an 035 Bentson wire was advanced under fluoroscopy into the abdominal aorta. The sheath was removed and a standard 5 [place] vascular  sheath was placed. The dilator was removed and the sheath was flushed. Mickelson catheter was advanced over the Bentson wire into the thoracic aorta. Double flush of the catheter was performed in the proximal descending thoracic aorta. Mickelson catheter was formed over the aortic arch, and the right bronchial artery was selected. Angiogram was performed. Micro catheter arrangement with a high-flow Renegade and 014 Fathom wire was introduced. Micro catheter was advanced beyond the origin of the supreme intercostal artery. Repeat angiogram was performed. Embolization was then performed with 300 - 500 micro meter embosphere. Angiogram was not performed. Micro catheter was withdrawn slightly and a final embolization was performed with 500 -700 micro meter embosphere. Final angiogram performed. The micro catheter system was removed, and additional segmental artery interrogation was performed. The thoracic 6 right segmental artery was interrogated. After initial angiogram, the micro catheter system was reintroduced,  and an attempt at selecting hypertrophied early branch to the right lower lobe was performed. During the attempted catheterization, the artery was in spasm/dissection, and a final angiogram at this segmental vessel demonstrated decreased flow. Thoracic 7 right segmental artery was interrogated. Again, abnormal early tortuous branch was identified with small contribution to the medial region of the lower right lung. Thoracic 8 right segmental artery interrogated. Thoracic 9 right segmental artery interrogated. Thoracic 10 right segmental artery interrogated. Angiogram of the right common femoral artery was performed. Catheters and wires were removed and an Exoseal device was deployed for hemostasis. Patient tolerated the procedure well and remained hemodynamically stable throughout. No complications were encountered and no significant blood loss encountered. FINDINGS: Ultrasound survey of the right inguinal  region demonstrates widely pacer right common femoral artery. Initial angiogram of the right bronchial artery at the thoracic 5 level demonstrates significant contribution to the right hilar region and right lower lung via very abnormal vasculature, which have abnormal tortuous anatomy and enlarging diameter as they continue distally from the bronchial artery. The right supreme intercostal artery arises from the apex of the bronchial artery. Status post embolization, there is nearly no flow to the hilar region and right lower lung via this right bronchial artery. Angiogram of thoracic 7 segmental artery demonstrates and early abnormal tortuous branch contributing to the infrahilar right lower lung. During attempted selection with the micro catheter system, the artery was spasm/dissected, with final angiogram demonstrating decreased flow to this territory with patency of the segmental vessel maintained. Embolization was not performed at this level. Angiogram of thoracic 8 segmental artery demonstrate a similar early tortuous abnormal artery, with minimal contribution to the infrahilar region. Angiogram of thoracic 9 segmental artery demonstrates no significant contribution. Angiogram of thoracic 10 segmental artery demonstrates no significant contribution. IMPRESSION: Status post angiogram of right bronchial artery and 5 additional right-sided segmental arteries, with embosphere embolization of the right bronchial artery for hemostasis. Status post Exoseal deployment. Signed, Dulcy Fanny. Earleen Newport, DO [facility] Radiology Electronically Signed   By: Corrie Mckusick D.O.   On: 07/18/2015 18:04   Ir Angiogram Pulmonary Right Selective  Result Date: 07/18/2015 INDICATION: 44 year old female with a history of hemoptysis. This is recurrent hemoptysis, with known non-small cell lung carcinoma and previous treatment with radiation therapy. She has been treated in July 2016 for  hemoptysis. She has had recurrent bleeding with admission Saturday July 1st, and presents now for repeat angiogram and possible embolization. EXAM: RIGHT PULMONARY ARTERIOGRAPHY; IR ULTRASOUND GUIDANCE VASC ACCESS RIGHT; ADDITIONAL ARTERIOGRAPHY; IR EMBO ART VEN HEMORR LYMPH EXTRAV INC GUIDE ROADMAPPING MEDICATIONS: No additional ANESTHESIA/SEDATION: Moderate (conscious) sedation was employed during this procedure. A total of Versed 4.0 mg and Fentanyl 100 mcg was administered intravenously. Moderate Sedation Time: 68 minutes. The patient's level of consciousness and vital signs were monitored continuously by radiology nursing throughout the procedure under my direct supervision. CONTRAST:  100 cc FLUOROSCOPY TIME:  Fluoroscopy Time: 23 minutes 24 seconds (989 mGy). COMPLICATIONS: None PROCEDURE: Informed consent was obtained from the patient following explanation of the procedure, risks, benefits and alternatives. The patient understands, agrees and consents for the procedure. All questions were addressed. A time out was performed prior to the initiation of the procedure. Maximal barrier sterile technique utilized including caps, mask, sterile gowns, sterile gloves, large sterile drape, hand hygiene, and Betadine prep. Ultrasound survey of the right inguinal region was performed with images stored and sent to PACs. A micropuncture needle was used  access the right common femoral artery under ultrasound. With excellent arterial blood flow returned, and an .018 micro wire was passed through the needle, observed enter the abdominal aorta under fluoroscopy. The needle was removed, and a micropuncture sheath was placed over the wire. The inner dilator and wire were removed, and an 035 Bentson wire was advanced under fluoroscopy into the abdominal aorta. The sheath was removed and a standard 5 [place] vascular sheath was placed. The dilator was removed and the sheath was flushed. Mickelson catheter was advanced over the  Bentson wire into the thoracic aorta. Double flush of the catheter was performed in the proximal descending thoracic aorta. Mickelson catheter was formed over the aortic arch, and the right bronchial artery was selected. Angiogram was performed. Micro catheter arrangement with a high-flow Renegade and 014 Fathom wire was introduced. Micro catheter was advanced beyond the origin of the supreme intercostal artery. Repeat angiogram was performed. Embolization was then performed with 300 - 500 micro meter embosphere. Angiogram was not performed. Micro catheter was withdrawn slightly and a final embolization was performed with 500 -700 micro meter embosphere. Final angiogram performed. The micro catheter system was removed, and additional segmental artery interrogation was performed. The thoracic 6 right segmental artery was interrogated. After initial angiogram, the micro catheter system was reintroduced, and an attempt at selecting hypertrophied early branch to the right lower lobe was performed. During the attempted catheterization, the artery was in spasm/dissection, and a final angiogram at this segmental vessel demonstrated decreased flow. Thoracic 7 right segmental artery was interrogated. Again, abnormal early tortuous branch was identified with small contribution to the medial region of the lower right lung. Thoracic 8 right segmental artery interrogated. Thoracic 9 right segmental artery interrogated. Thoracic 10 right segmental artery interrogated. Angiogram of the right common femoral artery was performed. Catheters and wires were removed and an Exoseal device was deployed for hemostasis. Patient tolerated the procedure well and remained hemodynamically stable throughout. No complications were encountered and no significant blood loss encountered. FINDINGS: Ultrasound survey of the right inguinal region demonstrates widely pacer right common femoral artery. Initial angiogram of the right bronchial artery at  the thoracic 5 level demonstrates significant contribution to the right hilar region and right lower lung via very abnormal vasculature, which have abnormal tortuous anatomy and enlarging diameter as they continue distally from the bronchial artery. The right supreme intercostal artery arises from the apex of the bronchial artery. Status post embolization, there is nearly no flow to the hilar region and right lower lung via this right bronchial artery. Angiogram of thoracic 7 segmental artery demonstrates and early abnormal tortuous branch contributing to the infrahilar right lower lung. During attempted selection with the micro catheter system, the artery was spasm/dissected, with final angiogram demonstrating decreased flow to this territory with patency of the segmental vessel maintained. Embolization was not performed at this level. Angiogram of thoracic 8 segmental artery demonstrate a similar early tortuous abnormal artery, with minimal contribution to the infrahilar region. Angiogram of thoracic 9 segmental artery demonstrates no significant contribution. Angiogram of thoracic 10 segmental artery demonstrates no significant contribution. IMPRESSION: Status post angiogram of right bronchial artery and 5 additional right-sided segmental arteries, with embosphere embolization of the right bronchial artery for hemostasis. Status post Exoseal deployment. Signed, Dulcy Fanny. Earleen Newport, DO [facility] Radiology Electronically Signed   By: Corrie Mckusick D.O.   On: 07/18/2015 18:04   Ir Angiogram Selective Each Additional Vessel  Result Date: 07/18/2015 INDICATION:  45 year old female with a history of hemoptysis. This is recurrent hemoptysis, with known non-small cell lung carcinoma and previous treatment with radiation therapy. She has been treated in July 2016 for hemoptysis. She has had recurrent bleeding with admission Saturday July 1st, and presents now for repeat  angiogram and possible embolization. EXAM: RIGHT PULMONARY ARTERIOGRAPHY; IR ULTRASOUND GUIDANCE VASC ACCESS RIGHT; ADDITIONAL ARTERIOGRAPHY; IR EMBO ART VEN HEMORR LYMPH EXTRAV INC GUIDE ROADMAPPING MEDICATIONS: No additional ANESTHESIA/SEDATION: Moderate (conscious) sedation was employed during this procedure. A total of Versed 4.0 mg and Fentanyl 100 mcg was administered intravenously. Moderate Sedation Time: 68 minutes. The patient's level of consciousness and vital signs were monitored continuously by radiology nursing throughout the procedure under my direct supervision. CONTRAST:  100 cc FLUOROSCOPY TIME:  Fluoroscopy Time: 23 minutes 24 seconds (989 mGy). COMPLICATIONS: None PROCEDURE: Informed consent was obtained from the patient following explanation of the procedure, risks, benefits and alternatives. The patient understands, agrees and consents for the procedure. All questions were addressed. A time out was performed prior to the initiation of the procedure. Maximal barrier sterile technique utilized including caps, mask, sterile gowns, sterile gloves, large sterile drape, hand hygiene, and Betadine prep. Ultrasound survey of the right inguinal region was performed with images stored and sent to PACs. A micropuncture needle was used access the right common femoral artery under ultrasound. With excellent arterial blood flow returned, and an .018 micro wire was passed through the needle, observed enter the abdominal aorta under fluoroscopy. The needle was removed, and a micropuncture sheath was placed over the wire. The inner dilator and wire were removed, and an 035 Bentson wire was advanced under fluoroscopy into the abdominal aorta. The sheath was removed and a standard 5 [place] vascular sheath was placed. The dilator was removed and the sheath was flushed. Mickelson catheter was advanced over the Bentson wire into the thoracic aorta. Double flush of the catheter was performed in the proximal descending  thoracic aorta. Mickelson catheter was formed over the aortic arch, and the right bronchial artery was selected. Angiogram was performed. Micro catheter arrangement with a high-flow Renegade and 014 Fathom wire was introduced. Micro catheter was advanced beyond the origin of the supreme intercostal artery. Repeat angiogram was performed. Embolization was then performed with 300 - 500 micro meter embosphere. Angiogram was not performed. Micro catheter was withdrawn slightly and a final embolization was performed with 500 -700 micro meter embosphere. Final angiogram performed. The micro catheter system was removed, and additional segmental artery interrogation was performed. The thoracic 6 right segmental artery was interrogated. After initial angiogram, the micro catheter system was reintroduced, and an attempt at selecting hypertrophied early branch to the right lower lobe was performed. During the attempted catheterization, the artery was in spasm/dissection, and a final angiogram at this segmental vessel demonstrated decreased flow. Thoracic 7 right segmental artery was interrogated. Again, abnormal early tortuous branch was identified with small contribution to the medial region of the lower right lung. Thoracic 8 right segmental artery interrogated. Thoracic 9 right segmental artery interrogated. Thoracic 10 right segmental artery interrogated. Angiogram of the right common femoral artery was performed. Catheters and wires were removed and an Exoseal device was deployed for hemostasis. Patient tolerated the procedure well and remained hemodynamically stable throughout. No complications were encountered and no significant blood loss encountered. FINDINGS: Ultrasound survey of the right inguinal region demonstrates widely pacer right common femoral artery. Initial angiogram of the right bronchial artery at the thoracic 5 level demonstrates significant contribution  to the right hilar region and right lower lung via  very abnormal vasculature, which have abnormal tortuous anatomy and enlarging diameter as they continue distally from the bronchial artery. The right supreme intercostal artery arises from the apex of the bronchial artery. Status post embolization, there is nearly no flow to the hilar region and right lower lung via this right bronchial artery. Angiogram of thoracic 7 segmental artery demonstrates and early abnormal tortuous branch contributing to the infrahilar right lower lung. During attempted selection with the micro catheter system, the artery was spasm/dissected, with final angiogram demonstrating decreased flow to this territory with patency of the segmental vessel maintained. Embolization was not performed at this level. Angiogram of thoracic 8 segmental artery demonstrate a similar early tortuous abnormal artery, with minimal contribution to the infrahilar region. Angiogram of thoracic 9 segmental artery demonstrates no significant contribution. Angiogram of thoracic 10 segmental artery demonstrates no significant contribution. IMPRESSION: Status post angiogram of right bronchial artery and 5 additional right-sided segmental arteries, with embosphere embolization of the right bronchial artery for hemostasis. Status post Exoseal deployment. Signed, Dulcy Fanny. Earleen Newport, DO [facility] Radiology Electronically Signed   By: Corrie Mckusick D.O.   On: 07/18/2015 18:04   Ir US Guide Vasc Access Right  Result Date: 07/18/2015 INDICATION: 45 year old female with a history of hemoptysis. This is recurrent hemoptysis, with known non-small cell lung carcinoma and previous treatment with radiation therapy. She has been treated in July 2016 for hemoptysis. She has had recurrent bleeding with admission Saturday July 1st, and presents now for repeat angiogram and possible embolization. EXAM: RIGHT PULMONARY ARTERIOGRAPHY; IR ULTRASOUND GUIDANCE VASC ACCESS RIGHT; ADDITIONAL  ARTERIOGRAPHY; IR EMBO ART VEN HEMORR LYMPH EXTRAV INC GUIDE ROADMAPPING MEDICATIONS: No additional ANESTHESIA/SEDATION: Moderate (conscious) sedation was employed during this procedure. A total of Versed 4.0 mg and Fentanyl 100 mcg was administered intravenously. Moderate Sedation Time: 68 minutes. The patient's level of consciousness and vital signs were monitored continuously by radiology nursing throughout the procedure under my direct supervision. CONTRAST:  100 cc FLUOROSCOPY TIME:  Fluoroscopy Time: 23 minutes 24 seconds (989 mGy). COMPLICATIONS: None PROCEDURE: Informed consent was obtained from the patient following explanation of the procedure, risks, benefits and alternatives. The patient understands, agrees and consents for the procedure. All questions were addressed. A time out was performed prior to the initiation of the procedure. Maximal barrier sterile technique utilized including caps, mask, sterile gowns, sterile gloves, large sterile drape, hand hygiene, and Betadine prep. Ultrasound survey of the right inguinal region was performed with images stored and sent to PACs. A micropuncture needle was used access the right common femoral artery under ultrasound. With excellent arterial blood flow returned, and an .018 micro wire was passed through the needle, observed enter the abdominal aorta under fluoroscopy. The needle was removed, and a micropuncture sheath was placed over the wire. The inner dilator and wire were removed, and an 035 Bentson wire was advanced under fluoroscopy into the abdominal aorta. The sheath was removed and a standard 5 [place] vascular sheath was placed. The dilator was removed and the sheath was flushed. Mickelson catheter was advanced over the Bentson wire into the thoracic aorta. Double flush of the catheter was performed in the proximal descending thoracic aorta. Mickelson catheter was formed over the aortic arch, and the right bronchial artery was selected. Angiogram was  performed. Micro catheter arrangement with a high-flow Renegade and 014 Fathom wire was introduced. Micro catheter was advanced beyond the origin  of the supreme intercostal artery. Repeat angiogram was performed. Embolization was then performed with 300 - 500 micro meter embosphere. Angiogram was not performed. Micro catheter was withdrawn slightly and a final embolization was performed with 500 -700 micro meter embosphere. Final angiogram performed. The micro catheter system was removed, and additional segmental artery interrogation was performed. The thoracic 6 right segmental artery was interrogated. After initial angiogram, the micro catheter system was reintroduced, and an attempt at selecting hypertrophied early branch to the right lower lobe was performed. During the attempted catheterization, the artery was in spasm/dissection, and a final angiogram at this segmental vessel demonstrated decreased flow. Thoracic 7 right segmental artery was interrogated. Again, abnormal early tortuous branch was identified with small contribution to the medial region of the lower right lung. Thoracic 8 right segmental artery interrogated. Thoracic 9 right segmental artery interrogated. Thoracic 10 right segmental artery interrogated. Angiogram of the right common femoral artery was performed. Catheters and wires were removed and an Exoseal device was deployed for hemostasis. Patient tolerated the procedure well and remained hemodynamically stable throughout. No complications were encountered and no significant blood loss encountered. FINDINGS: Ultrasound survey of the right inguinal region demonstrates widely pacer right common femoral artery. Initial angiogram of the right bronchial artery at the thoracic 5 level demonstrates significant contribution to the right hilar region and right lower lung via very abnormal vasculature, which have abnormal tortuous anatomy and enlarging diameter as they continue distally from the  bronchial artery. The right supreme intercostal artery arises from the apex of the bronchial artery. Status post embolization, there is nearly no flow to the hilar region and right lower lung via this right bronchial artery. Angiogram of thoracic 7 segmental artery demonstrates and early abnormal tortuous branch contributing to the infrahilar right lower lung. During attempted selection with the micro catheter system, the artery was spasm/dissected, with final angiogram demonstrating decreased flow to this territory with patency of the segmental vessel maintained. Embolization was not performed at this level. Angiogram of thoracic 8 segmental artery demonstrate a similar early tortuous abnormal artery, with minimal contribution to the infrahilar region. Angiogram of thoracic 9 segmental artery demonstrates no significant contribution. Angiogram of thoracic 10 segmental artery demonstrates no significant contribution. IMPRESSION: Status post angiogram of right bronchial artery and 5 additional right-sided segmental arteries, with embosphere embolization of the right bronchial artery for hemostasis. Status post Exoseal deployment. Signed, Dulcy Fanny. Earleen Newport, DO [facility] Radiology Electronically Signed   By: Corrie Mckusick D.O.   On: 07/18/2015 18:04   [place] Guide Roadmapping  Result Date: 07/18/2015 INDICATION: 45 year old female with a history of hemoptysis. This is recurrent hemoptysis, with known non-small cell lung carcinoma and previous treatment with radiation therapy. She has been treated in July 2016 for hemoptysis. She has had recurrent bleeding with admission Saturday July 1st, and presents now for repeat angiogram and possible embolization. EXAM: RIGHT PULMONARY ARTERIOGRAPHY; IR ULTRASOUND GUIDANCE VASC ACCESS RIGHT; ADDITIONAL ARTERIOGRAPHY; IR EMBO ART VEN HEMORR LYMPH EXTRAV INC GUIDE ROADMAPPING MEDICATIONS: No  additional ANESTHESIA/SEDATION: Moderate (conscious) sedation was employed during this procedure. A total of Versed 4.0 mg and Fentanyl 100 mcg was administered intravenously. Moderate Sedation Time: 68 minutes. The patient's level of consciousness and vital signs were monitored continuously by radiology nursing throughout the procedure under my direct supervision. CONTRAST:  100 cc FLUOROSCOPY TIME:  Fluoroscopy Time: 23 minutes 24 seconds (989 mGy). COMPLICATIONS: None PROCEDURE: Informed  consent was obtained from the patient following explanation of the procedure, risks, benefits and alternatives. The patient understands, agrees and consents for the procedure. All questions were addressed. A time out was performed prior to the initiation of the procedure. Maximal barrier sterile technique utilized including caps, mask, sterile gowns, sterile gloves, large sterile drape, hand hygiene, and Betadine prep. Ultrasound survey of the right inguinal region was performed with images stored and sent to PACs. A micropuncture needle was used access the right common femoral artery under ultrasound. With excellent arterial blood flow returned, and an .018 micro wire was passed through the needle, observed enter the abdominal aorta under fluoroscopy. The needle was removed, and a micropuncture sheath was placed over the wire. The inner dilator and wire were removed, and an 035 Bentson wire was advanced under fluoroscopy into the abdominal aorta. The sheath was removed and a standard 5 [place] vascular sheath was placed. The dilator was removed and the sheath was flushed. Mickelson catheter was advanced over the Bentson wire into the thoracic aorta. Double flush of the catheter was performed in the proximal descending thoracic aorta. Mickelson catheter was formed over the aortic arch, and the right bronchial artery was selected. Angiogram was performed. Micro catheter arrangement with a high-flow Renegade and 014 Fathom wire was  introduced. Micro catheter was advanced beyond the origin of the supreme intercostal artery. Repeat angiogram was performed. Embolization was then performed with 300 - 500 micro meter embosphere. Angiogram was not performed. Micro catheter was withdrawn slightly and a final embolization was performed with 500 -700 micro meter embosphere. Final angiogram performed. The micro catheter system was removed, and additional segmental artery interrogation was performed. The thoracic 6 right segmental artery was interrogated. After initial angiogram, the micro catheter system was reintroduced, and an attempt at selecting hypertrophied early branch to the right lower lobe was performed. During the attempted catheterization, the artery was in spasm/dissection, and a final angiogram at this segmental vessel demonstrated decreased flow. Thoracic 7 right segmental artery was interrogated. Again, abnormal early tortuous branch was identified with small contribution to the medial region of the lower right lung. Thoracic 8 right segmental artery interrogated. Thoracic 9 right segmental artery interrogated. Thoracic 10 right segmental artery interrogated. Angiogram of the right common femoral artery was performed. Catheters and wires were removed and an Exoseal device was deployed for hemostasis. Patient tolerated the procedure well and remained hemodynamically stable throughout. No complications were encountered and no significant blood loss encountered. FINDINGS: Ultrasound survey of the right inguinal region demonstrates widely pacer right common femoral artery. Initial angiogram of the right bronchial artery at the thoracic 5 level demonstrates significant contribution to the right hilar region and right lower lung via very abnormal vasculature, which have abnormal tortuous anatomy and enlarging diameter as they continue distally from the bronchial artery. The right supreme intercostal artery arises from the apex of the bronchial  artery. Status post embolization, there is nearly no flow to the hilar region and right lower lung via this right bronchial artery. Angiogram of thoracic 7 segmental artery demonstrates and early abnormal tortuous branch contributing to the infrahilar right lower lung. During attempted selection with the micro catheter system, the artery was spasm/dissected, with final angiogram demonstrating decreased flow to this territory with patency of the segmental vessel maintained. Embolization was not performed at this level. Angiogram of thoracic 8 segmental artery demonstrate a similar early tortuous abnormal artery, with minimal contribution to the infrahilar region. Angiogram of thoracic 9 segmental artery  demonstrates no significant contribution. Angiogram of thoracic 10 segmental artery demonstrates no significant contribution. IMPRESSION: Status post angiogram of right bronchial artery and 5 additional right-sided segmental arteries, with embosphere embolization of the right bronchial artery for hemostasis. Status post Exoseal deployment. Signed, Dulcy Fanny. Earleen Newport, DO [facility] Radiology Electronically Signed   By: Corrie Mckusick D.O.   On: 07/18/2015 18:04    Impression:    Ms. Trager is a 45 yo woman with adenocarcinoma of the right lower lobe. She is recovering well from the effects of radiation. We reviewed her recent MRI of the brain and discussed that there was not progression of disease.  Plan:  She will have a routine MRI of the brain in 4 months. She has a follow up appointment with Dr. Julien Nordmann 08/25/2015. I will notify Dr. Julien Nordmann about her back pain and order an MRI of her spine. She has a follow up appointment with Shona Simpson, [facility] 09/13/2015.   ------------------------------------------------  Jodelle Gross, MD, PhD    This document serves as a record of services personally performed by Kyung Rudd, MD. It was created on his behalf by Lendon Collar, a trained medical scribe. The creation of this record is based on the scribe's personal observations and the provider's statements to them. This document has been checked and approved by the attending provider.

## 2015-08-16 ENCOUNTER — Other Ambulatory Visit: Payer: Self-pay | Admitting: Internal Medicine

## 2015-08-16 DIAGNOSIS — C3491 Malignant neoplasm of unspecified part of right bronchus or lung: Secondary | ICD-10-CM

## 2015-08-16 NOTE — Addendum Note (Signed)
Encounter addended by: Benn Moulder, RN on: 08/16/2015  8:29 AM<BR>    Actions taken: Charge Capture section accepted

## 2015-08-23 ENCOUNTER — Encounter (HOSPITAL_COMMUNITY): Payer: Self-pay

## 2015-08-23 ENCOUNTER — Ambulatory Visit (HOSPITAL_COMMUNITY)
Admission: RE | Admit: 2015-08-23 | Discharge: 2015-08-23 | Disposition: A | Discharge status: Home / Self Care | Payer: Medicaid Other | Source: Ambulatory Visit | Attending: Internal Medicine | Admitting: Internal Medicine

## 2015-08-23 DIAGNOSIS — C7951 Secondary malignant neoplasm of bone: Secondary | ICD-10-CM | POA: Insufficient documentation

## 2015-08-23 DIAGNOSIS — C3491 Malignant neoplasm of unspecified part of right bronchus or lung: Secondary | ICD-10-CM | POA: Insufficient documentation

## 2015-08-23 MED ORDER — IOPAMIDOL (ISOVUE-300) INJECTION 61%
100.0000 mL | Freq: Once | INTRAVENOUS | Status: AC | PRN
  Administered 2015-08-23: 100 mL via INTRAVENOUS

## 2015-08-25 ENCOUNTER — Ambulatory Visit (HOSPITAL_BASED_OUTPATIENT_CLINIC_OR_DEPARTMENT_OTHER): Payer: Medicaid Other

## 2015-08-25 ENCOUNTER — Telehealth: Payer: Self-pay | Admitting: Internal Medicine

## 2015-08-25 ENCOUNTER — Encounter: Payer: Self-pay | Admitting: Internal Medicine

## 2015-08-25 ENCOUNTER — Ambulatory Visit (HOSPITAL_BASED_OUTPATIENT_CLINIC_OR_DEPARTMENT_OTHER): Payer: Medicaid Other | Admitting: Internal Medicine

## 2015-08-25 ENCOUNTER — Other Ambulatory Visit (HOSPITAL_BASED_OUTPATIENT_CLINIC_OR_DEPARTMENT_OTHER): Payer: Medicaid Other

## 2015-08-25 VITALS — BP 105/62 | HR 109 | Temp 98.2°F | Resp 18 | Ht 60.0 in | Wt 106.9 lb

## 2015-08-25 VITALS — BP 97/59 | HR 92 | Temp 98.2°F

## 2015-08-25 DIAGNOSIS — C342 Malignant neoplasm of middle lobe, bronchus or lung: Secondary | ICD-10-CM

## 2015-08-25 DIAGNOSIS — C7970 Secondary malignant neoplasm of unspecified adrenal gland: Secondary | ICD-10-CM | POA: Diagnosis not present

## 2015-08-25 DIAGNOSIS — C3491 Malignant neoplasm of unspecified part of right bronchus or lung: Secondary | ICD-10-CM

## 2015-08-25 DIAGNOSIS — M9080 Osteopathy in diseases classified elsewhere, unspecified site: Secondary | ICD-10-CM

## 2015-08-25 DIAGNOSIS — C7931 Secondary malignant neoplasm of brain: Secondary | ICD-10-CM

## 2015-08-25 DIAGNOSIS — C7951 Secondary malignant neoplasm of bone: Secondary | ICD-10-CM | POA: Diagnosis not present

## 2015-08-25 DIAGNOSIS — R630 Anorexia: Secondary | ICD-10-CM

## 2015-08-25 DIAGNOSIS — R21 Rash and other nonspecific skin eruption: Secondary | ICD-10-CM

## 2015-08-25 DIAGNOSIS — E889 Metabolic disorder, unspecified: Secondary | ICD-10-CM

## 2015-08-25 DIAGNOSIS — C3431 Malignant neoplasm of lower lobe, right bronchus or lung: Secondary | ICD-10-CM

## 2015-08-25 DIAGNOSIS — M898X9 Other specified disorders of bone, unspecified site: Secondary | ICD-10-CM

## 2015-08-25 LAB — COMPREHENSIVE METABOLIC PANEL
ALBUMIN: 3.4 g/dL — AB (ref 3.5–5.0)
ALK PHOS: 51 U/L (ref 40–150)
ALT: 17 U/L (ref 0–55)
ANION GAP: 9 meq/L (ref 3–11)
AST: 18 U/L (ref 5–34)
BUN: 8.6 mg/dL (ref 7.0–26.0)
CALCIUM: 9 mg/dL (ref 8.4–10.4)
CO2: 27 mEq/L (ref 22–29)
CREATININE: 0.6 mg/dL (ref 0.6–1.1)
Chloride: 101 mEq/L (ref 98–109)
EGFR: >90 mL/min/{1.73_m2} (ref >90)
Glucose: 89 mg/dl (ref 70–140)
Potassium: 4 mEq/L (ref 3.5–5.1)
Sodium: 137 mEq/L (ref 136–145)
Total Bilirubin: 0.79 mg/dL (ref 0.20–1.20)
Total Protein: 7 g/dL (ref 6.4–8.3)

## 2015-08-25 LAB — CBC WITH DIFFERENTIAL/PLATELET
BASO%: 0.6 % (ref 0.0–2.0)
BASOS ABS: 0 10*3/uL (ref 0.0–0.1)
EOS ABS: 0.2 10*3/uL (ref 0.0–0.5)
EOS%: 2.8 % (ref 0.0–7.0)
HEMATOCRIT: 44.3 % (ref 34.8–46.6)
HEMOGLOBIN: 14.9 g/dL (ref 11.6–15.9)
LYMPH%: 9.9 % — ABNORMAL LOW (ref 14.0–49.7)
MCH: 31.8 pg (ref 25.1–34.0)
MCHC: 33.7 g/dL (ref 31.5–36.0)
MCV: 94.3 fL (ref 79.5–101.0)
MONO#: 0.8 10*3/uL (ref 0.1–0.9)
MONO%: 12.6 % (ref 0.0–14.0)
NEUT#: 4.7 10*3/uL (ref 1.5–6.5)
NEUT%: 74.1 % (ref 38.4–76.8)
Platelets: 350 10*3/uL (ref 145–400)
RBC: 4.7 10*6/uL (ref 3.70–5.45)
RDW: 13.3 % (ref 11.2–14.5)
WBC: 6.3 10*3/uL (ref 3.9–10.3)
lymph#: 0.6 10*3/uL — ABNORMAL LOW (ref 0.9–3.3)

## 2015-08-25 MED ORDER — DENOSUMAB 120 MG/1.7ML ~~LOC~~ SOLN
120.0000 mg | Freq: Once | SUBCUTANEOUS | Status: AC
  Administered 2015-08-25: 120 mg via SUBCUTANEOUS
  Filled 2015-08-25: qty 1.7

## 2015-08-25 MED ORDER — LORATADINE 10 MG PO TABS
10.0000 mg | ORAL_TABLET | Freq: Every day | ORAL | 0 refills | Status: DC
Start: 2015-08-25 — End: 2016-04-30

## 2015-08-25 MED ORDER — HYDROCOD POLST-CPM POLST ER 10-8 MG/5ML PO SUER: 5.0000 mL | Freq: Two times a day (BID) | ORAL | 0 refills | Status: DC | PRN

## 2015-08-25 MED ORDER — BENZONATATE 100 MG PO CAPS: 100.0000 mg | ORAL_CAPSULE | Freq: Three times a day (TID) | ORAL | 0 refills | Status: DC | PRN

## 2015-08-25 MED FILL — CLINDAMYCIN PH 1% GEL: 1 | 10 days supply | Qty: 30 | Fill #1

## 2015-08-25 MED FILL — LORATADINE 10 MG TABLET: 10 | 30 days supply | Qty: 30 | Fill #0

## 2015-08-25 NOTE — Patient Instructions (Signed)
Denosumab injection  What is this medicine?  DENOSUMAB (den oh sue mab) slows bone breakdown. Prolia is used to treat osteoporosis in women after menopause and in men. Xgeva is used to prevent bone fractures and other bone problems caused by cancer bone metastases. Xgeva is also used to treat giant cell tumor of the bone.  This medicine may be used for other purposes; ask your health care provider or pharmacist if you have questions.  What should I tell my health care provider before I take this medicine?  They need to know if you have any of these conditions:  -dental disease  -eczema  -infection or history of infections  -kidney disease or on dialysis  -low blood calcium or vitamin D  -malabsorption syndrome  -scheduled to have surgery or tooth extraction  -taking medicine that contains denosumab  -thyroid or parathyroid disease  -an unusual reaction to denosumab, other medicines, foods, dyes, or preservatives  -pregnant or trying to get pregnant  -breast-feeding  How should I use this medicine?  This medicine is for injection under the skin. It is given by a health care professional in a hospital or clinic setting.  If you are getting Prolia, a special MedGuide will be given to you by the pharmacist with each prescription and refill. Be sure to read this information carefully each time.  For Prolia, talk to your pediatrician regarding the use of this medicine in children. Special care may be needed. For Xgeva, talk to your pediatrician regarding the use of this medicine in children. While this drug may be prescribed for children as young as 13 years for selected conditions, precautions do apply.  Overdosage: If you think you have taken too much of this medicine contact a poison control center or emergency room at once.  NOTE: This medicine is only for you. Do not share this medicine with others.  What if I miss a dose?  It is important not to miss your dose. Call your doctor or health care professional if you are  unable to keep an appointment.  What may interact with this medicine?  Do not take this medicine with any of the following medications:  -other medicines containing denosumab  This medicine may also interact with the following medications:  -medicines that suppress the immune system  -medicines that treat cancer  -steroid medicines like prednisone or cortisone  This list may not describe all possible interactions. Give your health care provider a list of all the medicines, herbs, non-prescription drugs, or dietary supplements you use. Also tell them if you smoke, drink alcohol, or use illegal drugs. Some items may interact with your medicine.  What should I watch for while using this medicine?  Visit your doctor or health care professional for regular checks on your progress. Your doctor or health care professional may order blood tests and other tests to see how you are doing.  Call your doctor or health care professional if you get a cold or other infection while receiving this medicine. Do not treat yourself. This medicine may decrease your body's ability to fight infection.  You should make sure you get enough calcium and vitamin D while you are taking this medicine, unless your doctor tells you not to. Discuss the foods you eat and the vitamins you take with your health care professional.  See your dentist regularly. Brush and floss your teeth as directed. Before you have any dental work done, tell your dentist you are receiving this medicine.  Do   not become pregnant while taking this medicine or for 5 months after stopping it. Women should inform their doctor if they wish to become pregnant or think they might be pregnant. There is a potential for serious side effects to an unborn child. Talk to your health care professional or pharmacist for more information.  What side effects may I notice from receiving this medicine?  Side effects that you should report to your doctor or health care professional as soon as  possible:  -allergic reactions like skin rash, itching or hives, swelling of the face, lips, or tongue  -breathing problems  -chest pain  -fast, irregular heartbeat  -feeling faint or lightheaded, falls  -fever, chills, or any other sign of infection  -muscle spasms, tightening, or twitches  -numbness or tingling  -skin blisters or bumps, or is dry, peels, or red  -slow healing or unexplained pain in the mouth or jaw  -unusual bleeding or bruising  Side effects that usually do not require medical attention (Report these to your doctor or health care professional if they continue or are bothersome.):  -muscle pain  -stomach upset, gas  This list may not describe all possible side effects. Call your doctor for medical advice about side effects. You may report side effects to FDA at 1-800-FDA-1088.  Where should I keep my medicine?  This medicine is only given in a clinic, doctor's office, or other health care setting and will not be stored at home.  NOTE: This sheet is a summary. It may not cover all possible information. If you have questions about this medicine, talk to your doctor, pharmacist, or health care provider.      2016, Elsevier/Gold Standard. (2011-07-02 12:37:47)

## 2015-08-25 NOTE — Progress Notes (Signed)
[place] [phone]   [phone]  OFFICE PROGRESS NOTE  [name], MD [place] [address]  DIAGNOSIS: Stage IV (T2a, N1, M1b) non-small cell lung cancer, adenocarcinoma presented with right middle lobe lung mass in addition to right hilar adenopathy as well as metastatic disease to the bone, brain as well as left adrenal metastasis diagnosed in July 2016  PRIOR THERAPY:  1) palliative radiotherapy to the brain as well as metastatic bone lesions in the pelvis. 2) Tarceva 150 mg by mouth daily started on 09/25/2014. Status post 5 months of treatment. 3) status post palliative radiotherapy to the right middle lobe lung mass for treatment of hemoptysis.  CURRENT THERAPY:  1) Tarceva 100 mg by mouth daily started on 02/19/2015. Status post 5 months of treatment. 2) Xgeva 120 g subcutaneously monthly.  INTERVAL HISTORY: AREEBAH MEINDERS 45 y.o. female returns to the clinic today for hospital follow-up visit with video interpreter. The patient is feeling better today. The patient is feeling much better today. Her hemoptysis has significantly improved after the palliative radiotherapy. The patient is tolerating her current treatment with Tarceva fairly well except for grade 1 skin rash mainly on the face and upper chest. She denied having any significant chest pain, or shortness of breath. She denied having any nausea or vomiting. She has occasional diarrhea. She had repeat CT scan of the chest, abdomen and pelvis performed recently and she is here today for evaluation and discussion of her scan results.   MEDICAL HISTORY: Past Medical History:  Diagnosis Date  . Bone metastasis ([place])   . Hemoptysis   . Hypokalemia   . lung ca dx'd 07/2014  . Lung mass   . Metastasis to adrenal gland ([place])   . Metastasis to brain ([place])   . Pneumonia   . Radiation 08/23/14-09/07/14   Brain/chest and left hip 30 Gy 12 Fx  . URI (upper  respiratory infection) 02-02-2015    ALLERGIES:  is allergic to other and doxycycline.  MEDICATIONS:  Current Outpatient Prescriptions  Medication Sig Dispense Refill  . albuterol (PROVENTIL HFA;VENTOLIN HFA) 108 (90 Base) MCG/ACT inhaler Inhale 2 puffs into the lungs every 6 (six) hours as needed for wheezing or shortness of breath. 1 Inhaler 0  . benzonatate (TESSALON) 100 MG capsule Take 1 capsule (100 mg total) by mouth 3 (three) times daily as needed for cough. 30 capsule 0  . chlorpheniramine-HYDROcodone (TUSSIONEX) 10-8 MG/5ML SUER Take 5 mLs by mouth every 12 (twelve) hours as needed for cough. 140 mL 0  . clindamycin (CLINDAGEL) 1 % gel Apply 1 application topically 2 (two) times daily.    . Cyanocobalamin (VITAMIN B-12) 2500 MCG SUBL Place 2,500 mcg under the tongue daily.    Marland Kitchen ENSURE (ENSURE) Take 1 Can by mouth.    . erlotinib (TARCEVA) 100 MG tablet Take 1 tablet (100 mg total) by mouth daily. Take on an empty stomach 1 hour before meals or 2 hours after 30 tablet 1  . ferrous sulfate 325 (65 FE) MG EC tablet Take 325 mg by mouth daily with breakfast.    . folic acid (FOLVITE) 720 MCG tablet Take 1 tablet (800 mcg total) by mouth daily. 30 tablet 1  . loratadine (CLARITIN) 10 MG tablet Take 10 mg by mouth daily.    . mometasone-formoterol (DULERA) 200-5 MCG/ACT AERO Inhale 2 puffs into the lungs 2 (two) times daily as needed. Reported on 07/29/2015    .  Nutritional Supplements (JUICE PLUS FIBRE PO) Take 1 capsule by mouth daily. Reported on 07/28/2015    . sucralfate (CARAFATE) 1 g tablet Take 1 tablet (1 g total) by mouth 4 (four) times daily -  with meals and at bedtime. 120 tablet 0  . TURMERIC PO Take 1 capsule by mouth 2 (two) times daily. Turmeric Superior 750 mg po bid    . Wound Dressings (SONAFINE EX) Apply 1 application topically daily.     No current facility-administered medications for this visit.     SURGICAL HISTORY:  Past Surgical History:  Procedure Laterality  Date  . VIDEO BRONCHOSCOPY Bilateral 08/09/2014   Procedure: VIDEO BRONCHOSCOPY WITHOUT FLUORO;  Surgeon: [name], MD;  Location: [facility] ENDOSCOPY;  Service: Cardiopulmonary;  Laterality: Bilateral;  . VIDEO BRONCHOSCOPY Bilateral 07/06/2015   Procedure: VIDEO BRONCHOSCOPY WITHOUT FLUORO;  Surgeon: [name], MD;  Location: [facility] ENDOSCOPY;  Service: Cardiopulmonary;  Laterality: Bilateral;    REVIEW OF SYSTEMS:  Constitutional: negative Eyes: negative Ears, nose, mouth, throat, and face: negative Respiratory: negative Cardiovascular: negative Gastrointestinal: negative Genitourinary:negative Integument/breast: negative Hematologic/lymphatic: negative Musculoskeletal:negative Neurological: negative Behavioral/Psych: negative Endocrine: negative Allergic/Immunologic: negative   PHYSICAL EXAMINATION: General appearance: alert, cooperative, fatigued and no distress Head: Normocephalic, without obvious abnormality, atraumatic Neck: no adenopathy, no JVD, supple, symmetrical, trachea midline and thyroid not enlarged, symmetric, no tenderness/mass/nodules Lymph nodes: Cervical, supraclavicular, and axillary nodes normal. Resp: clear to auscultation bilaterally Back: symmetric, no curvature. ROM normal. No CVA tenderness. Cardio: regular rate and rhythm, S1, S2 normal, no murmur, click, rub or gallop GI: soft, non-tender; bowel sounds normal; no masses,  no organomegaly Extremities: extremities normal, atraumatic, no cyanosis or edema Neurologic: Alert and oriented X 3, normal strength and tone. Normal symmetric reflexes. Normal coordination and gait Skin exam showed maculopapular grade 1 skin rash on the face and upper chest.  ECOG PERFORMANCE STATUS: 1 - Symptomatic but completely ambulatory  Blood pressure 105/62, pulse (!) 109, temperature 98.2 F (36.8 C), temperature source Oral, resp. rate 18, height 5' (1.524 m), weight 106 lb 14.4 oz (48.5 kg), SpO2 100 %.  LABORATORY  DATA: Lab Results  Component Value Date   WBC 6.3 08/25/2015   HGB 14.9 08/25/2015   HCT 44.3 08/25/2015   MCV 94.3 08/25/2015   PLT 350 08/25/2015      Chemistry      Component Value Date/Time   NA 138 07/28/2015 0918   K 3.6 07/28/2015 0918   CL 109 07/19/2015 0509   CO2 26 07/28/2015 0918   BUN 8.4 07/28/2015 0918   CREATININE 0.6 07/28/2015 0918      Component Value Date/Time   CALCIUM 9.1 07/28/2015 0918   ALKPHOS 58 07/28/2015 0918   AST 16 07/28/2015 0918   ALT 12 07/28/2015 0918   BILITOT 0.67 07/28/2015 0918       RADIOGRAPHIC STUDIES: Ct Chest W Contrast  Result Date: 08/23/2015 CLINICAL DATA:  Non-small cell lung cancer. EXAM: CT CHEST, ABDOMEN, AND PELVIS WITH CONTRAST TECHNIQUE: Multidetector CT imaging of the chest, abdomen and pelvis was performed following the standard protocol during bolus administration of intravenous contrast. CONTRAST:  169m ISOVUE-300 IOPAMIDOL (ISOVUE-300) INJECTION 61% COMPARISON:  03/28/15 FINDINGS: CT CHEST FINDINGS Mediastinum/Lymph Nodes: The heart size appears within normal limits. No pericardial effusion identified. The trachea appears patent and is midline. Unremarkable appearance of the esophagus. No mediastinal or hilar adenopathy identified. Lungs/Pleura: No pleural effusion. Fibrosis and consolidation within the perihilar right midlung is identified compatible with  changes of external beam radiation. The appearance is the appearance is similar to the previous exam. Musculoskeletal: Multifocal areas of sclerotic bone metastases are again identified and appears similar to the previous exam. No pathologic fractures identified within the spine. CT ABDOMEN PELVIS FINDINGS Hepatobiliary: There is no focal liver abnormality identified. The gallbladder appears within normal limits. Pancreas: No mass, inflammatory changes, or other significant abnormality. Spleen: Within normal limits in size and appearance. Adrenals/Urinary Tract: Normal  appearance of the adrenal glands. The kidneys are unremarkable. Normal appearance of the urinary bladder. Stomach/Bowel: The stomach appears normal. The small bowel loops have a normal course and caliber. The appendix is visualized and appears within normal limits. Unremarkable appearance of the colon. Vascular/Lymphatic: Normal appearance of the abdominal aorta. No enlarged retroperitoneal or mesenteric adenopathy. No enlarged pelvic or inguinal lymph nodes. Reproductive: The uterus appears normal. Right ovary cysts are identified. The largest measures 2.4 cm. Nabothian cysts noted within the lower uterine segment/ cervix. No left adnexal mass. Other: There is no ascites or focal fluid collections within the abdomen or pelvis. Musculoskeletal: Multifocal bone metastases are identified. These are predominately scratch set there is a mixed lytic and sclerotic appearance of these lesions. The appearance is not significantly changed when compared with the previous exam. IMPRESSION: 1. No acute findings within the chest abdomen or pelvis. 2. Stable appearance of the right lung with evolutionary changes of external beam radiation. 3. Stable appearance of multifocal bone metastases. No pathologic fractures identified at this time. Electronically Signed   By: Kerby Moors M.D.   On: 08/23/2015 09:51   Mr Jeri Cos TD Contrast  Result Date: 08/11/2015 CLINICAL DATA:  Metastatic lung cancer.  Stereotactic radiosurgery EXAM: MRI HEAD WITHOUT AND WITH CONTRAST TECHNIQUE: Multiplanar, multiecho pulse sequences of the brain and surrounding structures were obtained without and with intravenous contrast. CONTRAST:  38m MULTIHANCE GADOBENATE DIMEGLUMINE 529 MG/ML IV SOLN COMPARISON:  MRI 05/05/2015 FINDINGS: Multiple hemorrhagic metastatic deposits are stable. These show evidence of prior hemorrhage and mild enhancement. There is improvement in edema surrounding the left frontal lesion. No new areas of edema or enhancement New  area of hyperintensity in the left frontal white matter over the convexity. This measures under 1 cm and was not present previously. This area does not enhance or show hemorrhage. This does show hyperintensity on diffusion imaging and appears to show mild restricted diffusion suggesting this most likely is subacute infarct. Correlate with clinical symptoms. Continued follow-up MRI is suggested however if this were metastatic disease it should enhance given the size of the lesion. Ventricle size normal. Negative for hydrocephalus. Normal cerebral volume. Pituitary normal in size. Circle of Willis patent. Normal orbital structures. IMPRESSION: Multiple hemorrhagic metastatic deposits are unchanged. Improvement in mild edema around the left frontal lesion New sub cm area of hyperintensity in the left frontal white matter without abnormal enhancement. This most likely is an area of subacute infarction. Electronically Signed   By: CFranchot GalloM.D.   On: 08/11/2015 17:51  Ct Abdomen Pelvis W Contrast  Result Date: 08/23/2015 CLINICAL DATA:  Non-small cell lung cancer. EXAM: CT CHEST, ABDOMEN, AND PELVIS WITH CONTRAST TECHNIQUE: Multidetector CT imaging of the chest, abdomen and pelvis was performed following the standard protocol during bolus administration of intravenous contrast. CONTRAST:  1072mISOVUE-300 IOPAMIDOL (ISOVUE-300) INJECTION 61% COMPARISON:  03/28/15 FINDINGS: CT CHEST FINDINGS Mediastinum/Lymph Nodes: The heart size appears within normal limits. No pericardial effusion identified. The trachea appears patent and is midline. Unremarkable appearance of  the esophagus. No mediastinal or hilar adenopathy identified. Lungs/Pleura: No pleural effusion. Fibrosis and consolidation within the perihilar right midlung is identified compatible with changes of external beam radiation. The appearance is the appearance is similar to the previous exam. Musculoskeletal: Multifocal areas of sclerotic bone metastases  are again identified and appears similar to the previous exam. No pathologic fractures identified within the spine. CT ABDOMEN PELVIS FINDINGS Hepatobiliary: There is no focal liver abnormality identified. The gallbladder appears within normal limits. Pancreas: No mass, inflammatory changes, or other significant abnormality. Spleen: Within normal limits in size and appearance. Adrenals/Urinary Tract: Normal appearance of the adrenal glands. The kidneys are unremarkable. Normal appearance of the urinary bladder. Stomach/Bowel: The stomach appears normal. The small bowel loops have a normal course and caliber. The appendix is visualized and appears within normal limits. Unremarkable appearance of the colon. Vascular/Lymphatic: Normal appearance of the abdominal aorta. No enlarged retroperitoneal or mesenteric adenopathy. No enlarged pelvic or inguinal lymph nodes. Reproductive: The uterus appears normal. Right ovary cysts are identified. The largest measures 2.4 cm. Nabothian cysts noted within the lower uterine segment/ cervix. No left adnexal mass. Other: There is no ascites or focal fluid collections within the abdomen or pelvis. Musculoskeletal: Multifocal bone metastases are identified. These are predominately scratch set there is a mixed lytic and sclerotic appearance of these lesions. The appearance is not significantly changed when compared with the previous exam. IMPRESSION: 1. No acute findings within the chest abdomen or pelvis. 2. Stable appearance of the right lung with evolutionary changes of external beam radiation. 3. Stable appearance of multifocal bone metastases. No pathologic fractures identified at this time. Electronically Signed   By: Kerby Moors M.D.   On: 08/23/2015 09:51    ASSESSMENT AND PLAN: This is a very pleasant 45 years old Asian female never smoker with recently diagnosed with stage IV non-small cell lung cancer, adenocarcinoma presented with right middle lobe lung mass in  addition to right hilar adenopathy and metastatic disease to the bones, brain and left adrenal gland.  She status post whole brain irradiation with some improvement in her disease on the recent MRI of the brain. The patient completed treatment with Tarceva 150 mg by mouth daily for 5 months and tolerating it fairly well except for grade 2 skin rash and recent episodes of diarrhea. She is currently on Tarceva 100 mg by mouth daily started 02/19/2015. She is tolerating the lower dose of Tarceva much better. Her recent CT scan of the chest, abdomen and pelvis showed no evidence for disease progression. MRI of the brain also showed no concerning finding for disease progression. I discussed the scan results with the patient today. I recommended for her to continue her current treatment with Tarceva 100 mg by mouth daily. For the metastatic bone lesions, she started treatment with Delton See and she is tolerating it well.  She will come back for follow-up visit in one month for reevaluation with repeat blood work. She was advised to call immediately if she has any concerning symptoms in the interval. The patient voices understanding of current disease status and treatment options and is in agreement with the current care plan.  All questions were answered. The patient knows to call the clinic with any problems, questions or concerns. We can certainly see the patient much sooner if necessary.  Disclaimer: This note was dictated with voice recognition software. Similar sounding words can inadvertently be transcribed and may not be corrected upon review.

## 2015-08-25 NOTE — Telephone Encounter (Signed)
Gave patient avs report and appointment for September. Per patient request updated language preference and need for interpreter.

## 2015-09-01 NOTE — Progress Notes (Signed)
°  Radiation Oncology         [phone] ________________________________  Name: Deborah Foster MRN: [id number]  Date: 08/03/2015  DOB: 27-Nov-1970  End of Treatment Note  Diagnosis:      ICD-9-CM ICD-10-CM   1. Primary cancer of right lower lobe of lung ([place]) 162.5 C34.31        Indication for treatment:  Palliative       Radiation treatment dates:   07/21/2015 to 08/03/2015  Site/dose:   The Right hilar tumor was treated to 30 Gy in 10 fractions at 3 Gy per fraction.   Beams/energy:   3D // 10X, 15X  Narrative: The patient tolerated radiation treatment relatively well.  The patient reported modest fatigue and dry cough with treatment.   Plan: The patient has completed radiation treatment. The patient will return to radiation oncology clinic for routine followup in one month. I advised them to call or return sooner if they have any questions or concerns related to their recovery or treatment.  ------------------------------------------------  Jodelle Gross, MD, PhD  This document serves as a record of services personally performed by Kyung Rudd, MD. It was created on his behalf by Arlyce Harman, a trained medical scribe. The creation of this record is based on the scribe's personal observations and the provider's statements to them. This document has been checked and approved by the attending provider.

## 2015-09-13 ENCOUNTER — Other Ambulatory Visit: Payer: Self-pay | Admitting: *Deleted

## 2015-09-13 ENCOUNTER — Ambulatory Visit: Payer: Self-pay | Admitting: Radiation Oncology

## 2015-09-13 DIAGNOSIS — C3491 Malignant neoplasm of unspecified part of right bronchus or lung: Secondary | ICD-10-CM

## 2015-09-13 DIAGNOSIS — R630 Anorexia: Secondary | ICD-10-CM

## 2015-09-13 DIAGNOSIS — C7951 Secondary malignant neoplasm of bone: Secondary | ICD-10-CM

## 2015-09-13 DIAGNOSIS — C7931 Secondary malignant neoplasm of brain: Secondary | ICD-10-CM

## 2015-09-13 MED ORDER — ERLOTINIB HCL 100 MG PO TABS: 100.0000 mg | ORAL_TABLET | Freq: Every day | ORAL | 1 refills | Status: DC

## 2015-09-19 NOTE — Addendum Note (Signed)
Encounter addended by: Kyung Rudd, MD on: 09/19/2015  8:13 PM<BR>    Actions taken: Visit diagnoses modified, Sign clinical note

## 2015-09-19 NOTE — Progress Notes (Signed)
  Radiation Oncology         [phone] ________________________________  Name: Deborah Foster MRN: [id number]  Date: 07/20/2015  DOB: 11/02/1970  SIMULATION AND TREATMENT PLANNING NOTE  DIAGNOSIS:     ICD-9-CM ICD-10-CM   1. Primary cancer of right lower lobe of lung ([facility]) 162.5 C34.31      Site:  Right lung  NARRATIVE:  The patient was brought to the [facility].  Identity was confirmed.  All relevant records and images related to the planned course of therapy were reviewed.   Written consent to proceed with treatment was confirmed which was freely given after reviewing the details related to the planned course of therapy had been reviewed with the patient.  Then, the patient was set-up in a stable reproducible  supine position for radiation therapy.  CT images were obtained.  Surface markings were placed.    Medically necessary complex treatment device(s) for immobilization:  Not applicable.   The CT images were loaded into the planning software.  Then the target and avoidance structures were contoured.  Treatment planning then occurred.  The radiation prescription was entered and confirmed.  A total of 4 complex treatment devices were fabricated which relate to the designed radiation treatment fields. Each of these customized fields/ complex treatment devices will be used on a daily basis during the radiation course. I have requested : 3D Simulation  I have requested a DVH of the following structures: Target volume, spinal cord, lungs, esophagus.   The patient will undergo daily image guidance to ensure accurate localization of the target, and adequate minimize dose to the normal surrounding structures in close proximity to the target.   PLAN:  The patient will receive 30 Gy in 10 fractions.   Special treatment procedure This represents a course of reirradiation. This will require extra work to develop a cumulative plan including the patient's prior radiation treatment  course to this area. This therefore represents a special treatment procedure  ________________________________   Jodelle Gross, MD, PhD

## 2015-09-21 ENCOUNTER — Ambulatory Visit
Admission: RE | Admit: 2015-09-21 | Discharge: 2015-09-21 | Disposition: A | Discharge status: Home / Self Care | Payer: Medicaid Other | Source: Ambulatory Visit | Attending: Radiation Oncology | Admitting: Radiation Oncology

## 2015-09-21 ENCOUNTER — Encounter: Payer: Self-pay | Admitting: Radiation Oncology

## 2015-09-21 VITALS — BP 102/66 | HR 96 | Temp 97.7°F | Ht 60.0 in | Wt 108.8 lb

## 2015-09-21 DIAGNOSIS — Z881 Allergy status to other antibiotic agents status: Secondary | ICD-10-CM | POA: Diagnosis not present

## 2015-09-21 DIAGNOSIS — Z79899 Other long term (current) drug therapy: Secondary | ICD-10-CM | POA: Diagnosis not present

## 2015-09-21 DIAGNOSIS — J029 Acute pharyngitis, unspecified: Secondary | ICD-10-CM | POA: Insufficient documentation

## 2015-09-21 DIAGNOSIS — Z923 Personal history of irradiation: Secondary | ICD-10-CM | POA: Diagnosis not present

## 2015-09-21 DIAGNOSIS — C7951 Secondary malignant neoplasm of bone: Secondary | ICD-10-CM | POA: Insufficient documentation

## 2015-09-21 DIAGNOSIS — M898X9 Other specified disorders of bone, unspecified site: Secondary | ICD-10-CM

## 2015-09-21 DIAGNOSIS — Z7951 Long term (current) use of inhaled steroids: Secondary | ICD-10-CM | POA: Insufficient documentation

## 2015-09-21 DIAGNOSIS — C7931 Secondary malignant neoplasm of brain: Secondary | ICD-10-CM

## 2015-09-21 DIAGNOSIS — C342 Malignant neoplasm of middle lobe, bronchus or lung: Secondary | ICD-10-CM | POA: Insufficient documentation

## 2015-09-21 DIAGNOSIS — E889 Metabolic disorder, unspecified: Secondary | ICD-10-CM

## 2015-09-21 DIAGNOSIS — M9080 Osteopathy in diseases classified elsewhere, unspecified site: Secondary | ICD-10-CM

## 2015-09-21 DIAGNOSIS — C3491 Malignant neoplasm of unspecified part of right bronchus or lung: Secondary | ICD-10-CM

## 2015-09-21 NOTE — Progress Notes (Addendum)
Deborah Foster reports that she has intermittent, frontal headaches, sore throat with expectoration of yellow sputum with blood. Denies any ataxia. Reports intermittent nausea and vomiting when she brushes here teeth and tongue. No changes in vision.  Reports pain in the left lower abdominal qudrant/hip region and grades this as a level 3/10.    BP 102/66 (BP Location: Right Arm, Patient Position: Sitting, Cuff Size: Normal)   Pulse 96   Temp 97.7 F (36.5 C) (Oral)   Ht 5' (1.524 m)   Wt 108 lb 12.8 oz (49.4 kg)   BMI 21.25 kg/m     Wt Readings from Last 3 Encounters:  09/21/15 108 lb 12.8 oz (49.4 kg)  08/25/15 106 lb 14.4 oz (48.5 kg)  08/15/15 105 lb 6.4 oz (47.8 kg)

## 2015-09-21 NOTE — Progress Notes (Signed)
Radiation Oncology         [phone] ________________________________  Name: Deborah Foster MRN: [id number]  Date: 09/21/2015  DOB: [birth year]  Post Treatment Note  CC: [name], MD  [name], *  Diagnosis:   Stage IV T2a, N1, M1b NSCLC, adenocarcinoma of the right middle lobe with metastatic disease to bone and brain.  Interval Since Last Radiation:  6 weeks   07/21/2015 to 08/03/2015:The Right hilar tumor was treated to 30 Gy in 10 fractions at 3 Gy per fraction.   08/23/14-09/07/14: 30 Gy in 12 fractions was prescribed to the right lung, whole brain, and left hip.  Narrative:  The patient returns today for routine follow-up. She tolerated radiotherapy well. She had a CT c/a/p that revealed stability of her disease without new evidence of metastatic disease on 08/23/15.                          On review of systems, the patient states she feels good. She admits to a few episodes of cough with yellow sputum and small tinge of blood. She reports this is becoming less frequent. She denies fevers, chills, chest pain, or shortness of breath. The last episode of bleeding was about 2 weeks ago. For the last 3-4 days though she has noticed a sore throat. She denies difficulty with eating or drinking. No known sick contacts are noted. No other complaints are noted.  ALLERGIES:  is allergic to other and doxycycline.  Meds: Current Outpatient Prescriptions  Medication Sig Dispense Refill  . albuterol (PROVENTIL HFA;VENTOLIN HFA) 108 (90 Base) MCG/ACT inhaler Inhale 2 puffs into the lungs every 6 (six) hours as needed for wheezing or shortness of breath. 1 Inhaler 0  . benzonatate (TESSALON) 100 MG capsule Take 1 capsule (100 mg total) by mouth 3 (three) times daily as needed for cough. 30 capsule 0  . chlorpheniramine-HYDROcodone (TUSSIONEX) 10-8 MG/5ML SUER Take 5 mLs by mouth every 12 (twelve) hours as needed for cough. 140 mL 0  . clindamycin (CLINDAGEL) 1 % gel Apply 1  application topically 2 (two) times daily.    . Cyanocobalamin (VITAMIN B-12) 2500 MCG SUBL Place 2,500 mcg under the tongue daily.    Marland Kitchen ENSURE (ENSURE) Take 1 Can by mouth.    . erlotinib (TARCEVA) 100 MG tablet Take 1 tablet (100 mg total) by mouth daily. Take on an empty stomach 1 hour before meals or 2 hours after 30 tablet 1  . ferrous sulfate 325 (65 FE) MG EC tablet Take 325 mg by mouth daily with breakfast.    . folic acid (FOLVITE) 371 MCG tablet Take 1 tablet (800 mcg total) by mouth daily. 30 tablet 1  . loratadine (CLARITIN) 10 MG tablet Take 1 tablet (10 mg total) by mouth daily. 30 tablet 0  . mometasone-formoterol (DULERA) 200-5 MCG/ACT AERO Inhale 2 puffs into the lungs 2 (two) times daily as needed. Reported on 07/29/2015    . Nutritional Supplements (JUICE PLUS FIBRE PO) Take 1 capsule by mouth daily. Reported on 07/28/2015    . sucralfate (CARAFATE) 1 g tablet Take 1 tablet (1 g total) by mouth 4 (four) times daily -  with meals and at bedtime. 120 tablet 0  . TURMERIC PO Take 1 capsule by mouth 2 (two) times daily. Turmeric Superior 750 mg po bid    . Wound Dressings (SONAFINE EX) Apply 1 application topically daily.     No current facility-administered  medications for this encounter.     Physical Findings:  height is 5' (1.524 m) and weight is 108 lb 12.8 oz (49.4 kg). Her oral temperature is 97.7 F (36.5 C). Her blood pressure is 102/66 and her pulse is 96.  In general this is a well appearing Asian female in no acute distress. She's alert and oriented x4 and appropriate throughout the examination. Cardiopulmonary assessment is negative for acute distress and she has normal breath sounds bilaterally without consolidation, wheezing, or rhonchi. She has cobblestoning noted in the posterior pharynx on HEENT exam without palpable adenopathy in the cervical, submental, or submandibular regions.   Lab Findings: Lab Results  Component Value Date   WBC 6.3 08/25/2015   HGB 14.9  08/25/2015   HCT 44.3 08/25/2015   MCV 94.3 08/25/2015   PLT 350 08/25/2015     Radiographic Findings: Ct Chest W Contrast  Result Date: 08/23/2015 CLINICAL DATA:  Non-small cell lung cancer. EXAM: CT CHEST, ABDOMEN, AND PELVIS WITH CONTRAST TECHNIQUE: Multidetector CT imaging of the chest, abdomen and pelvis was performed following the standard protocol during bolus administration of intravenous contrast. CONTRAST:  146m ISOVUE-300 IOPAMIDOL (ISOVUE-300) INJECTION 61% COMPARISON:  03/28/15 FINDINGS: CT CHEST FINDINGS Mediastinum/Lymph Nodes: The heart size appears within normal limits. No pericardial effusion identified. The trachea appears patent and is midline. Unremarkable appearance of the esophagus. No mediastinal or hilar adenopathy identified. Lungs/Pleura: No pleural effusion. Fibrosis and consolidation within the perihilar right midlung is identified compatible with changes of external beam radiation. The appearance is the appearance is similar to the previous exam. Musculoskeletal: Multifocal areas of sclerotic bone metastases are again identified and appears similar to the previous exam. No pathologic fractures identified within the spine. CT ABDOMEN PELVIS FINDINGS Hepatobiliary: There is no focal liver abnormality identified. The gallbladder appears within normal limits. Pancreas: No mass, inflammatory changes, or other significant abnormality. Spleen: Within normal limits in size and appearance. Adrenals/Urinary Tract: Normal appearance of the adrenal glands. The kidneys are unremarkable. Normal appearance of the urinary bladder. Stomach/Bowel: The stomach appears normal. The small bowel loops have a normal course and caliber. The appendix is visualized and appears within normal limits. Unremarkable appearance of the colon. Vascular/Lymphatic: Normal appearance of the abdominal aorta. No enlarged retroperitoneal or mesenteric adenopathy. No enlarged pelvic or inguinal lymph nodes.  Reproductive: The uterus appears normal. Right ovary cysts are identified. The largest measures 2.4 cm. Nabothian cysts noted within the lower uterine segment/ cervix. No left adnexal mass. Other: There is no ascites or focal fluid collections within the abdomen or pelvis. Musculoskeletal: Multifocal bone metastases are identified. These are predominately scratch set there is a mixed lytic and sclerotic appearance of these lesions. The appearance is not significantly changed when compared with the previous exam. IMPRESSION: 1. No acute findings within the chest abdomen or pelvis. 2. Stable appearance of the right lung with evolutionary changes of external beam radiation. 3. Stable appearance of multifocal bone metastases. No pathologic fractures identified at this time. Electronically Signed   By: TKerby MoorsM.D.   On: 08/23/2015 09:51   Ct Abdomen Pelvis W Contrast  Result Date: 08/23/2015 CLINICAL DATA:  Non-small cell lung cancer. EXAM: CT CHEST, ABDOMEN, AND PELVIS WITH CONTRAST TECHNIQUE: Multidetector CT imaging of the chest, abdomen and pelvis was performed following the standard protocol during bolus administration of intravenous contrast. CONTRAST:  104mISOVUE-300 IOPAMIDOL (ISOVUE-300) INJECTION 61% COMPARISON:  03/28/15 FINDINGS: CT CHEST FINDINGS Mediastinum/Lymph Nodes: The heart size appears within normal limits.  No pericardial effusion identified. The trachea appears patent and is midline. Unremarkable appearance of the esophagus. No mediastinal or hilar adenopathy identified. Lungs/Pleura: No pleural effusion. Fibrosis and consolidation within the perihilar right midlung is identified compatible with changes of external beam radiation. The appearance is the appearance is similar to the previous exam. Musculoskeletal: Multifocal areas of sclerotic bone metastases are again identified and appears similar to the previous exam. No pathologic fractures identified within the spine. CT ABDOMEN  PELVIS FINDINGS Hepatobiliary: There is no focal liver abnormality identified. The gallbladder appears within normal limits. Pancreas: No mass, inflammatory changes, or other significant abnormality. Spleen: Within normal limits in size and appearance. Adrenals/Urinary Tract: Normal appearance of the adrenal glands. The kidneys are unremarkable. Normal appearance of the urinary bladder. Stomach/Bowel: The stomach appears normal. The small bowel loops have a normal course and caliber. The appendix is visualized and appears within normal limits. Unremarkable appearance of the colon. Vascular/Lymphatic: Normal appearance of the abdominal aorta. No enlarged retroperitoneal or mesenteric adenopathy. No enlarged pelvic or inguinal lymph nodes. Reproductive: The uterus appears normal. Right ovary cysts are identified. The largest measures 2.4 cm. Nabothian cysts noted within the lower uterine segment/ cervix. No left adnexal mass. Other: There is no ascites or focal fluid collections within the abdomen or pelvis. Musculoskeletal: Multifocal bone metastases are identified. These are predominately scratch set there is a mixed lytic and sclerotic appearance of these lesions. The appearance is not significantly changed when compared with the previous exam. IMPRESSION: 1. No acute findings within the chest abdomen or pelvis. 2. Stable appearance of the right lung with evolutionary changes of external beam radiation. 3. Stable appearance of multifocal bone metastases. No pathologic fractures identified at this time. Electronically Signed   By: Kerby Moors M.D.   On: 08/23/2015 09:51    Impression/Plan: 1. Stage IV T2a, N1, M1b NSCLC, adenocarcinoma of the right middle lobe with metastatic disease to bone and brain. The patient appears to be doing well following her radiotherapy. She has a few episodes of intermittent hemoptysis and we will follow this closely. She is given precautions to call. She will see Dr. Julien Nordmann  next week as well. Her next MRI of the brain is due in late October-early November, and we will coordinate this along with follow up. 2. Viral pharyngitis. The patient was counseled on supportive care and to contact us if her symptoms worsen.    Used Video remote interpreter: [phone], Warnell Forester.   Carola Rhine, PAC

## 2015-09-21 NOTE — Addendum Note (Signed)
Encounter addended by: Benn Moulder, RN on: 09/21/2015  1:59 PM<BR>    Actions taken: Charge Capture section accepted

## 2015-09-22 ENCOUNTER — Encounter: Payer: Self-pay | Admitting: Gastroenterology

## 2015-09-22 ENCOUNTER — Encounter (INDEPENDENT_AMBULATORY_CARE_PROVIDER_SITE_OTHER): Payer: Self-pay

## 2015-09-22 ENCOUNTER — Ambulatory Visit (INDEPENDENT_AMBULATORY_CARE_PROVIDER_SITE_OTHER): Payer: Medicaid Other | Admitting: Gastroenterology

## 2015-09-22 VITALS — BP 98/64 | HR 72 | Ht 59.0 in | Wt 108.4 lb

## 2015-09-22 DIAGNOSIS — K219 Gastro-esophageal reflux disease without esophagitis: Secondary | ICD-10-CM | POA: Diagnosis not present

## 2015-09-22 DIAGNOSIS — R1310 Dysphagia, unspecified: Secondary | ICD-10-CM | POA: Diagnosis not present

## 2015-09-22 MED ORDER — PANTOPRAZOLE SODIUM 40 MG PO TBEC: 40.0000 mg | DELAYED_RELEASE_TABLET | Freq: Every day | ORAL | 3 refills | Status: DC

## 2015-09-22 NOTE — Progress Notes (Signed)
Deborah Foster    [id number]    Feb 26, 1970  Primary Care Physician:ELKINS,Deborah [name], MD  Referring Physician: [name], MD [address] [place], [place] [address]  Chief complaint: Sore throat, Dysphagia  HPI: 45 year old female with stage IV non-small cell lung cancer, adenocarcinoma R middle lobe, metastatic disease to bone, brain and left adrenall diagnosed in July 2016, status post palliative radiotherapy and is currently on Tarceva here for evaluation of dysphagia predominantly to large pills and intermittent to solids. She is accompanied by an interpreter. She complained of heartburn on daily basis with regurgitation of fluid and acid taste in the mouth. She has difficulty swallowing mostly with large pills, no issue most the time with solids or liquids but occasionally does choke if she tries to drink past are eats a large piece of meat.  She is currently taking turmeric to help with heartburn but still has ongoing symptoms. She also has intermittent sore throat worse when she has postnasal drip with seasonal allergies. Denies any nausea, vomiting, abdominal pain, melena, bright red blood per rectum, constipation or diarrhea.   Outpatient Encounter Prescriptions as of 09/22/2015  Medication Sig  . albuterol (PROVENTIL HFA;VENTOLIN HFA) 108 (90 Base) MCG/ACT inhaler Inhale 2 puffs into the lungs every 6 (six) hours as needed for wheezing or shortness of breath.  . benzonatate (TESSALON) 100 MG capsule Take 1 capsule (100 mg total) by mouth 3 (three) times daily as needed for cough.  . chlorpheniramine-HYDROcodone (TUSSIONEX) 10-8 MG/5ML SUER Take 5 mLs by mouth every 12 (twelve) hours as needed for cough.  . clindamycin (CLINDAGEL) 1 % gel Apply 1 application topically 2 (two) times daily.  . Cyanocobalamin (VITAMIN B-12) 2500 MCG SUBL Place 2,500 mcg under the tongue daily.  Marland Kitchen ENSURE (ENSURE) Take 1 Can by mouth.  . erlotinib (TARCEVA) 100 MG tablet Take 1  tablet (100 mg total) by mouth daily. Take on an empty stomach 1 hour before meals or 2 hours after  . ferrous sulfate 325 (65 FE) MG EC tablet Take 325 mg by mouth daily with breakfast.  . folic acid (FOLVITE) 191 MCG tablet Take 1 tablet (800 mcg total) by mouth daily.  Marland Kitchen loratadine (CLARITIN) 10 MG tablet Take 1 tablet (10 mg total) by mouth daily.  . mometasone-formoterol (DULERA) 200-5 MCG/ACT AERO Inhale 2 puffs into the lungs 2 (two) times daily as needed. Reported on 07/29/2015  . Nutritional Supplements (JUICE PLUS FIBRE PO) Take 1 capsule by mouth daily. Reported on 07/28/2015  . sucralfate (CARAFATE) 1 g tablet Take 1 tablet (1 g total) by mouth 4 (four) times daily -  with meals and at bedtime.  . TURMERIC PO Take 1 capsule by mouth 2 (two) times daily. Turmeric Superior 750 mg po bid  . Wound Dressings (SONAFINE EX) Apply 1 application topically daily.  . pantoprazole (PROTONIX) 40 MG tablet Take 1 tablet (40 mg total) by mouth daily.   No facility-administered encounter medications on file as of 09/22/2015.     Allergies as of 09/22/2015 - Review Complete 09/22/2015  Allergen Reaction Noted  . Other Other (See Comments) 04/30/2015  . Doxycycline Nausea And Vomiting 12/07/2014    Past Medical History:  Diagnosis Date  . Bone metastasis ([place])   . Hemoptysis   . Hypokalemia   . lung ca dx'd 07/2014  . Lung mass   . Metastasis to adrenal gland ([place])   . Metastasis to brain ([place])   .  Pneumonia   . Radiation 08/23/14-09/07/14   Brain/chest and left hip 30 Gy 12 Fx  . URI (upper respiratory infection) 02-02-2015    Past Surgical History:  Procedure Laterality Date  . VIDEO BRONCHOSCOPY Bilateral 08/09/2014   Procedure: VIDEO BRONCHOSCOPY WITHOUT FLUORO;  Surgeon: [name], MD;  Location: [facility] ENDOSCOPY;  Service: Cardiopulmonary;  Laterality: Bilateral;  . VIDEO BRONCHOSCOPY Bilateral 07/06/2015   Procedure: VIDEO BRONCHOSCOPY WITHOUT FLUORO;  Surgeon: [name],  MD;  Location: [facility] ENDOSCOPY;  Service: Cardiopulmonary;  Laterality: Bilateral;    Family History  Problem Relation Age of Onset  . Hyperlipidemia Mother     Social History   Social History  . Marital status: Married    Spouse name: N/A  . Number of children: 3  . Years of education: N/A   Occupational History  . unemployed    Social History Main Topics  . Smoking status: Never Smoker  . Smokeless tobacco: Never Used  . Alcohol use No  . Drug use: No  . Sexual activity: Not on file   Other Topics Concern  . Not on file   Social History Narrative   Patient moved to [place] in [place].   Had been in [place] since that time.      Review of systems: Review of Systems  Constitutional: Negative for fever and chills.  HENT: Negative.   Eyes: Negative for blurred vision.  Respiratory: Negative for cough, shortness of breath and wheezing.   Cardiovascular: Negative for chest pain and palpitations.  Gastrointestinal: as per HPI Genitourinary: Negative for dysuria, urgency, frequency and hematuria.  Musculoskeletal: Negative for myalgias, back pain and joint pain.  Skin: Positive for itching and rash.  Neurological: Negative for dizziness, tremors, focal weakness, seizures and loss of consciousness.  Endo/Heme/Allergies: Positive for seasonal allergies.  Psychiatric/Behavioral: Positive for depression, Negative for suicidal ideas and hallucinations.  All other systems reviewed and are negative.   Physical Exam: Vitals:   09/22/15 0823  BP: 98/64  Pulse: 72   Body mass index is 21.89 kg/m. Gen:      No acute distress HEENT:  EOMI, sclera anicteric Neck:     No masses; no thyromegaly Lungs:    Clear to auscultation bilaterally; normal respiratory effort CV:         Regular rate and rhythm; no murmurs Abd:      + bowel sounds; soft, non-tender; no palpable masses, no distension Ext:    No edema; adequate peripheral perfusion Skin:      Warm and dry; no  rash Neuro: alert and oriented x 3 Psych: normal mood and affect  Data Reviewed:Reviewed chart in epic  Assessment and Plan/Recommendations:  45 year old female with stage IV metastatic non-small cell lung cancer status post whole brain irradiation, on chemotherapy Tarceva here for evaluation of intermittent dysphagia predominantly 2 pills, sore throat, heartburn and dyspepsia Stable disease with no additional metastases based on recent CT chest abdomen and pelvis in August 2017  Based on barium esophagram has findings suggestive of esophageal dysmotility with poor clearance of barium column, barium tablet passed through the EG junction easily with no evidence of significant distal esophageal stricture We'll schedule for EGD for evaluation, rule out peptic stricture/radiation mucosal injury, esophagitis/gastritis and possible +/-esophageal dilation After we sent the prescription, pharmacist called back and informed, there is interaction between PPI or H2 blocker with Tarceva that decreases the drug efficacy, opted not to prescribe either and discontinued the prescription for Protonix.  Greater  than 50% of the time used for counseling as well as treatment plan and follow-up. She had multiple questions which were answered to her satisfaction  K. Denzil Magnuson , MD [phone] Mon-Fri 8a-5p [phone] after 5p, weekends, holidays  CC: [name], *

## 2015-09-22 NOTE — Patient Instructions (Addendum)
You have been scheduled for an endoscopy. Please follow written instructions given to you at your visit today. If you use inhalers (even only as needed), please bring them with you on the day of your procedure. Your physician has requested that you go to www.startemmi.com and enter the access code given to you at your visit today. This web site gives a general overview about your procedure. However, you should still follow specific instructions given to you by our office regarding your preparation for the procedure.   We will send Protonix to your pharmacy   Your follow up appointment with Dr Silverio Decamp is 12/06/2015 at 9:45am

## 2015-09-26 ENCOUNTER — Encounter: Payer: Self-pay | Admitting: Internal Medicine

## 2015-09-26 ENCOUNTER — Ambulatory Visit (HOSPITAL_BASED_OUTPATIENT_CLINIC_OR_DEPARTMENT_OTHER): Payer: Medicaid Other | Admitting: Internal Medicine

## 2015-09-26 ENCOUNTER — Ambulatory Visit (HOSPITAL_BASED_OUTPATIENT_CLINIC_OR_DEPARTMENT_OTHER): Payer: Medicaid Other

## 2015-09-26 ENCOUNTER — Telehealth: Payer: Self-pay | Admitting: Internal Medicine

## 2015-09-26 ENCOUNTER — Other Ambulatory Visit (HOSPITAL_BASED_OUTPATIENT_CLINIC_OR_DEPARTMENT_OTHER): Payer: Medicaid Other

## 2015-09-26 VITALS — BP 103/63 | HR 101 | Temp 98.3°F | Resp 18 | Ht 59.0 in | Wt 106.6 lb

## 2015-09-26 DIAGNOSIS — C3491 Malignant neoplasm of unspecified part of right bronchus or lung: Secondary | ICD-10-CM

## 2015-09-26 DIAGNOSIS — M898X9 Other specified disorders of bone, unspecified site: Secondary | ICD-10-CM

## 2015-09-26 DIAGNOSIS — C7951 Secondary malignant neoplasm of bone: Secondary | ICD-10-CM

## 2015-09-26 DIAGNOSIS — C342 Malignant neoplasm of middle lobe, bronchus or lung: Secondary | ICD-10-CM

## 2015-09-26 DIAGNOSIS — C3431 Malignant neoplasm of lower lobe, right bronchus or lung: Secondary | ICD-10-CM

## 2015-09-26 DIAGNOSIS — E889 Metabolic disorder, unspecified: Secondary | ICD-10-CM

## 2015-09-26 DIAGNOSIS — C7931 Secondary malignant neoplasm of brain: Secondary | ICD-10-CM

## 2015-09-26 DIAGNOSIS — M9080 Osteopathy in diseases classified elsewhere, unspecified site: Secondary | ICD-10-CM

## 2015-09-26 DIAGNOSIS — R319 Hematuria, unspecified: Secondary | ICD-10-CM

## 2015-09-26 DIAGNOSIS — Z5111 Encounter for antineoplastic chemotherapy: Secondary | ICD-10-CM

## 2015-09-26 LAB — COMPREHENSIVE METABOLIC PANEL
ALT: 17 U/L (ref 0–55)
AST: 17 U/L (ref 5–34)
Albumin: 3.2 g/dL — ABNORMAL LOW (ref 3.5–5.0)
Alkaline Phosphatase: 67 U/L (ref 40–150)
Anion Gap: 9 mEq/L (ref 3–11)
BUN: 13 mg/dL (ref 7.0–26.0)
CHLORIDE: 106 meq/L (ref 98–109)
CO2: 26 mEq/L (ref 22–29)
Calcium: 9 mg/dL (ref 8.4–10.4)
Creatinine: 0.6 mg/dL (ref 0.6–1.1)
EGFR: >90 mL/min/{1.73_m2} (ref >90)
GLUCOSE: 90 mg/dL (ref 70–140)
POTASSIUM: 3.9 meq/L (ref 3.5–5.1)
SODIUM: 140 meq/L (ref 136–145)
TOTAL PROTEIN: 7.1 g/dL (ref 6.4–8.3)
Total Bilirubin: 0.57 mg/dL (ref 0.20–1.20)

## 2015-09-26 LAB — CBC WITH DIFFERENTIAL/PLATELET
BASO%: 0.4 % (ref 0.0–2.0)
BASOS ABS: 0 10*3/uL (ref 0.0–0.1)
EOS ABS: 0.2 10*3/uL (ref 0.0–0.5)
EOS%: 2.5 % (ref 0.0–7.0)
HEMATOCRIT: 41.5 % (ref 34.8–46.6)
HEMOGLOBIN: 14.1 g/dL (ref 11.6–15.9)
LYMPH#: 0.9 10*3/uL (ref 0.9–3.3)
LYMPH%: 11.6 % — ABNORMAL LOW (ref 14.0–49.7)
MCH: 31.3 pg (ref 25.1–34.0)
MCHC: 34 g/dL (ref 31.5–36.0)
MCV: 92 fL (ref 79.5–101.0)
MONO#: 0.7 10*3/uL (ref 0.1–0.9)
MONO%: 9.9 % (ref 0.0–14.0)
NEUT%: 75.6 % (ref 38.4–76.8)
NEUTROS ABS: 5.6 10*3/uL (ref 1.5–6.5)
Platelets: 335 10*3/uL (ref 145–400)
RBC: 4.51 10*6/uL (ref 3.70–5.45)
RDW: 12.4 % (ref 11.2–14.5)
WBC: 7.3 10*3/uL (ref 3.9–10.3)

## 2015-09-26 LAB — URINALYSIS, MICROSCOPIC - CHCC
Bilirubin (Urine): NEGATIVE
GLUCOSE UR CHCC: NEGATIVE mg/dL
KETONES: NEGATIVE mg/dL
LEUKOCYTE ESTERASE: NEGATIVE
NITRITE: NEGATIVE
PH: 7 (ref 4.6–8.0)
PROTEIN: NEGATIVE mg/dL
Specific Gravity, Urine: 1.005 (ref 1.003–1.035)
Urobilinogen, UR: 0.2 mg/dL (ref 0.2–1)

## 2015-09-26 MED ORDER — DENOSUMAB 120 MG/1.7ML ~~LOC~~ SOLN
120.0000 mg | Freq: Once | SUBCUTANEOUS | Status: AC
  Administered 2015-09-26: 120 mg via SUBCUTANEOUS
  Filled 2015-09-26: qty 1.7

## 2015-09-26 MED ORDER — DENOSUMAB 120 MG/1.7ML ~~LOC~~ SOLN: 120.0000 mg | Freq: Once | SUBCUTANEOUS | Status: DC

## 2015-09-26 NOTE — Progress Notes (Signed)
[place] [phone]   [phone]  OFFICE PROGRESS NOTE  [name], MD [place] [address]  DIAGNOSIS: Stage IV (T2a, N1, M1b) non-small cell lung cancer, adenocarcinoma presented with right middle lobe lung mass in addition to right hilar adenopathy as well as metastatic disease to the bone, brain as well as left adrenal metastasis diagnosed in July 2016  PRIOR THERAPY:  1) palliative radiotherapy to the brain as well as metastatic bone lesions in the pelvis. 2) Tarceva 150 mg by mouth daily started on 09/25/2014. Status post 5 months of treatment. 3) status post palliative radiotherapy to the right middle lobe lung mass for treatment of hemoptysis.  CURRENT THERAPY:  1) Tarceva 100 mg by mouth daily started on 02/19/2015. Status post 6 months of treatment. 2) Xgeva 120 g subcutaneously monthly.  INTERVAL HISTORY: Deborah Foster 45 y.o. female returns to the clinic today for hospital follow-up visit. The patient is feeling much better today. She has no more hemoptysis. The patient is tolerating her current treatment with Tarceva fairly well except for grade 1 skin rash mainly on the face. She denied having any significant chest pain, or shortness of breath. She denied having any nausea or vomiting. She has occasional diarrhea. She was seen recently by gastroenterology for evaluation of mild dysphagia and she is scheduled for EGD next week. She is here today with repeat blood work.  MEDICAL HISTORY: Past Medical History:  Diagnosis Date  . Bone metastasis ([place])   . Hemoptysis   . Hypokalemia   . lung ca dx'd 07/2014  . Lung mass   . Metastasis to adrenal gland ([place])   . Metastasis to brain ([place])   . Pneumonia   . Radiation 08/23/14-09/07/14   Brain/chest and left hip 30 Gy 12 Fx  . URI (upper respiratory infection) 02-02-2015    ALLERGIES:  is allergic to other and doxycycline.  MEDICATIONS:  Current  Outpatient Prescriptions  Medication Sig Dispense Refill  . albuterol (PROVENTIL HFA;VENTOLIN HFA) 108 (90 Base) MCG/ACT inhaler Inhale 2 puffs into the lungs every 6 (six) hours as needed for wheezing or shortness of breath. 1 Inhaler 0  . benzonatate (TESSALON) 100 MG capsule Take 1 capsule (100 mg total) by mouth 3 (three) times daily as needed for cough. 30 capsule 0  . chlorpheniramine-HYDROcodone (TUSSIONEX) 10-8 MG/5ML SUER Take 5 mLs by mouth every 12 (twelve) hours as needed for cough. 140 mL 0  . clindamycin (CLINDAGEL) 1 % gel Apply 1 application topically 2 (two) times daily.    . Cyanocobalamin (VITAMIN B-12) 2500 MCG SUBL Place 2,500 mcg under the tongue daily.    Marland Kitchen ENSURE (ENSURE) Take 1 Can by mouth.    . erlotinib (TARCEVA) 100 MG tablet Take 1 tablet (100 mg total) by mouth daily. Take on an empty stomach 1 hour before meals or 2 hours after 30 tablet 1  . ferrous sulfate 325 (65 FE) MG EC tablet Take 325 mg by mouth daily with breakfast.    . folic acid (FOLVITE) 672 MCG tablet Take 1 tablet (800 mcg total) by mouth daily. 30 tablet 1  . loratadine (CLARITIN) 10 MG tablet Take 1 tablet (10 mg total) by mouth daily. 30 tablet 0  . mometasone-formoterol (DULERA) 200-5 MCG/ACT AERO Inhale 2 puffs into the lungs 2 (two) times daily as needed. Reported on 07/29/2015    . Nutritional Supplements (JUICE PLUS FIBRE PO) Take 1 capsule by  mouth daily. Reported on 07/28/2015    . TURMERIC PO Take 1 capsule by mouth 2 (two) times daily. Turmeric Superior 750 mg po bid    . Wound Dressings (SONAFINE EX) Apply 1 application topically daily.     No current facility-administered medications for this visit.     SURGICAL HISTORY:  Past Surgical History:  Procedure Laterality Date  . VIDEO BRONCHOSCOPY Bilateral 08/09/2014   Procedure: VIDEO BRONCHOSCOPY WITHOUT FLUORO;  Surgeon: [name], MD;  Location: [facility] ENDOSCOPY;  Service: Cardiopulmonary;  Laterality: Bilateral;  . VIDEO  BRONCHOSCOPY Bilateral 07/06/2015   Procedure: VIDEO BRONCHOSCOPY WITHOUT FLUORO;  Surgeon: [name], MD;  Location: [facility] ENDOSCOPY;  Service: Cardiopulmonary;  Laterality: Bilateral;    REVIEW OF SYSTEMS:  A comprehensive review of systems was negative except for: Gastrointestinal: positive for diarrhea and dysphagia Integument/breast: positive for rash   PHYSICAL EXAMINATION: General appearance: alert, cooperative, fatigued and no distress Head: Normocephalic, without obvious abnormality, atraumatic Neck: no adenopathy, no JVD, supple, symmetrical, trachea midline and thyroid not enlarged, symmetric, no tenderness/mass/nodules Lymph nodes: Cervical, supraclavicular, and axillary nodes normal. Resp: clear to auscultation bilaterally Back: symmetric, no curvature. ROM normal. No CVA tenderness. Cardio: regular rate and rhythm, S1, S2 normal, no murmur, click, rub or gallop GI: soft, non-tender; bowel sounds normal; no masses,  no organomegaly Extremities: extremities normal, atraumatic, no cyanosis or edema Neurologic: Alert and oriented X 3, normal strength and tone. Normal symmetric reflexes. Normal coordination and gait Skin exam showed maculopapular grade 1 skin rash on the face and upper chest.  ECOG PERFORMANCE STATUS: 1 - Symptomatic but completely ambulatory  Blood pressure 103/63, pulse (!) 101, temperature 98.3 F (36.8 C), temperature source Oral, resp. rate 18, height '4\' 11"'$  (1.499 m), weight 106 lb 9.6 oz (48.4 kg), SpO2 98 %.  LABORATORY DATA: Lab Results  Component Value Date   WBC 7.3 09/26/2015   HGB 14.1 09/26/2015   HCT 41.5 09/26/2015   MCV 92.0 09/26/2015   PLT 335 09/26/2015      Chemistry      Component Value Date/Time   NA 137 08/25/2015 0746   K 4.0 08/25/2015 0746   CL 109 07/19/2015 0509   CO2 27 08/25/2015 0746   BUN 8.6 08/25/2015 0746   CREATININE 0.6 08/25/2015 0746      Component Value Date/Time   CALCIUM 9.0 08/25/2015 0746   ALKPHOS  51 08/25/2015 0746   AST 18 08/25/2015 0746   ALT 17 08/25/2015 0746   BILITOT 0.79 08/25/2015 0746       RADIOGRAPHIC STUDIES: No results found.  ASSESSMENT AND PLAN: This is a very pleasant 45 years old Asian female never smoker with recently diagnosed with stage IV non-small cell lung cancer, adenocarcinoma presented with right middle lobe lung mass in addition to right hilar adenopathy and metastatic disease to the bones, brain and left adrenal gland.  She status post whole brain irradiation with some improvement in her disease on the recent MRI of the brain. The patient completed treatment with Tarceva 150 mg by mouth daily for 5 months and tolerating it fairly well except for grade 2 skin rashAnd diarrhea. She is currently on Tarceva 100 mg by mouth daily started 02/19/2015. She is tolerating the lower dose of Tarceva much better. She status post 6 months. I recommended for her to continue her current treatment with Tarceva 100 mg by mouth daily. For the metastatic bone lesions, she started treatment with Delton See and she is tolerating  it well.  She will come back for follow-up visit in one month for reevaluation with repeat blood work. She was advised to call immediately if she has any concerning symptoms in the interval. The patient voices understanding of current disease status and treatment options and is in agreement with the current care plan.  All questions were answered. The patient knows to call the clinic with any problems, questions or concerns. We can certainly see the patient much sooner if necessary.  Disclaimer: This note was dictated with voice recognition software. Similar sounding words can inadvertently be transcribed and may not be corrected upon review.

## 2015-09-26 NOTE — Patient Instructions (Signed)
Denosumab injection  What is this medicine?  DENOSUMAB (den oh sue mab) slows bone breakdown. Prolia is used to treat osteoporosis in women after menopause and in men. Xgeva is used to prevent bone fractures and other bone problems caused by cancer bone metastases. Xgeva is also used to treat giant cell tumor of the bone.  This medicine may be used for other purposes; ask your health care provider or pharmacist if you have questions.  What should I tell my health care provider before I take this medicine?  They need to know if you have any of these conditions:  -dental disease  -eczema  -infection or history of infections  -kidney disease or on dialysis  -low blood calcium or vitamin D  -malabsorption syndrome  -scheduled to have surgery or tooth extraction  -taking medicine that contains denosumab  -thyroid or parathyroid disease  -an unusual reaction to denosumab, other medicines, foods, dyes, or preservatives  -pregnant or trying to get pregnant  -breast-feeding  How should I use this medicine?  This medicine is for injection under the skin. It is given by a health care professional in a hospital or clinic setting.  If you are getting Prolia, a special MedGuide will be given to you by the pharmacist with each prescription and refill. Be sure to read this information carefully each time.  For Prolia, talk to your pediatrician regarding the use of this medicine in children. Special care may be needed. For Xgeva, talk to your pediatrician regarding the use of this medicine in children. While this drug may be prescribed for children as young as 13 years for selected conditions, precautions do apply.  Overdosage: If you think you have taken too much of this medicine contact a poison control center or emergency room at once.  NOTE: This medicine is only for you. Do not share this medicine with others.  What if I miss a dose?  It is important not to miss your dose. Call your doctor or health care professional if you are  unable to keep an appointment.  What may interact with this medicine?  Do not take this medicine with any of the following medications:  -other medicines containing denosumab  This medicine may also interact with the following medications:  -medicines that suppress the immune system  -medicines that treat cancer  -steroid medicines like prednisone or cortisone  This list may not describe all possible interactions. Give your health care provider a list of all the medicines, herbs, non-prescription drugs, or dietary supplements you use. Also tell them if you smoke, drink alcohol, or use illegal drugs. Some items may interact with your medicine.  What should I watch for while using this medicine?  Visit your doctor or health care professional for regular checks on your progress. Your doctor or health care professional may order blood tests and other tests to see how you are doing.  Call your doctor or health care professional if you get a cold or other infection while receiving this medicine. Do not treat yourself. This medicine may decrease your body's ability to fight infection.  You should make sure you get enough calcium and vitamin D while you are taking this medicine, unless your doctor tells you not to. Discuss the foods you eat and the vitamins you take with your health care professional.  See your dentist regularly. Brush and floss your teeth as directed. Before you have any dental work done, tell your dentist you are receiving this medicine.  Do   not become pregnant while taking this medicine or for 5 months after stopping it. Women should inform their doctor if they wish to become pregnant or think they might be pregnant. There is a potential for serious side effects to an unborn child. Talk to your health care professional or pharmacist for more information.  What side effects may I notice from receiving this medicine?  Side effects that you should report to your doctor or health care professional as soon as  possible:  -allergic reactions like skin rash, itching or hives, swelling of the face, lips, or tongue  -breathing problems  -chest pain  -fast, irregular heartbeat  -feeling faint or lightheaded, falls  -fever, chills, or any other sign of infection  -muscle spasms, tightening, or twitches  -numbness or tingling  -skin blisters or bumps, or is dry, peels, or red  -slow healing or unexplained pain in the mouth or jaw  -unusual bleeding or bruising  Side effects that usually do not require medical attention (Report these to your doctor or health care professional if they continue or are bothersome.):  -muscle pain  -stomach upset, gas  This list may not describe all possible side effects. Call your doctor for medical advice about side effects. You may report side effects to FDA at 1-800-FDA-1088.  Where should I keep my medicine?  This medicine is only given in a clinic, doctor's office, or other health care setting and will not be stored at home.  NOTE: This sheet is a summary. It may not cover all possible information. If you have questions about this medicine, talk to your doctor, pharmacist, or health care provider.      2016, Elsevier/Gold Standard. (2011-07-02 12:37:47)

## 2015-09-26 NOTE — Telephone Encounter (Signed)
Gave patient avs report and appointments for October  °

## 2015-09-28 ENCOUNTER — Ambulatory Visit (AMBULATORY_SURGERY_CENTER): Payer: Medicaid Other | Admitting: Gastroenterology

## 2015-09-28 ENCOUNTER — Encounter: Payer: Self-pay | Admitting: Gastroenterology

## 2015-09-28 VITALS — BP 90/61 | HR 83 | Temp 98.0°F | Resp 24 | Ht 59.0 in | Wt 108.0 lb

## 2015-09-28 DIAGNOSIS — R1310 Dysphagia, unspecified: Secondary | ICD-10-CM

## 2015-09-28 DIAGNOSIS — K219 Gastro-esophageal reflux disease without esophagitis: Secondary | ICD-10-CM | POA: Diagnosis not present

## 2015-09-28 MED ORDER — SODIUM CHLORIDE 0.9 % IV SOLN: 500.0000 mL | INTRAVENOUS | Status: DC

## 2015-09-28 NOTE — Patient Instructions (Addendum)
YOU HAD AN ENDOSCOPIC PROCEDURE TODAY AT [facility]:   Refer to the procedure report that was given to you for any specific questions about what was found during the examination.  If the procedure report does not answer your questions, please call your gastroenterologist to clarify.  If you requested that your care partner not be given the details of your procedure findings, then the procedure report has been included in a sealed envelope for you to review at your convenience later.  YOU SHOULD EXPECT: Some feelings of bloating in the abdomen. Passage of more gas than usual.  Walking can help get rid of the air that was put into your GI tract during the procedure and reduce the bloating.   Please Note:  You might notice some irritation and congestion in your nose or some drainage.  This is from the oxygen used during your procedure.  There is no need for concern and it should clear up in a day or so.  SYMPTOMS TO REPORT IMMEDIATELY:    Following upper endoscopy (EGD)  Vomiting of blood or coffee ground material  New chest pain or pain under the shoulder blades  Painful or persistently difficult swallowing  New shortness of breath  Fever of 100F or higher  Black, tarry-looking stools  For urgent or emergent issues, a gastroenterologist can be reached at any hour by calling [phone].   DIET:  We do recommend a small meal at first, but then you may proceed to your regular diet.  Drink plenty of fluids but you should avoid alcoholic beverages for 24 hours.  ACTIVITY:  You should plan to take it easy for the rest of today and you should NOT DRIVE or use heavy machinery until tomorrow (because of the sedation medicines used during the test).    FOLLOW UP: Our staff will call the number listed on your records the next business day following your procedure to check on you and address any questions or concerns that you may have regarding the information given to you  following your procedure. If we do not reach you, we will leave a message.  However, if you are feeling well and you are not experiencing any problems, there is no need to return our call.  We will assume that you have returned to your regular daily activities without incident.  If any biopsies were taken you will be contacted by phone or by letter within the next 1-3 weeks.  Please call us at [phone] if you have not heard about the biopsies in 3 weeks.    SIGNATURES/CONFIDENTIALITY: You and/or your care partner have signed paperwork which will be entered into your electronic medical record.  These signatures attest to the fact that that the information above on your After Visit Summary has been reviewed and is understood.  Full responsibility of the confidentiality of this discharge information lies with you and/or your care-partner.  Read all of the handouts given to you by your recovery room nurse.  Thank-you for choosing [place] for your healthcare needs today.  No Nsaids x 2 weeks.

## 2015-09-28 NOTE — Op Note (Signed)
[facility] Patient Name: Deborah Foster Procedure Date: 09/28/2015 8:47 AM MRN: [id number] Endoscopist: Mauri Pole , MD Age: 45 Referring MD:  Date of Birth: 04-17-1970 Gender: Female Account #: [id number] Procedure:                Upper GI endoscopy Indications:              Dysphagia Medicines:                Monitored Anesthesia Care Procedure:                Pre-Anesthesia Assessment:                           - Prior to the procedure, a History and Physical                            was performed, and patient medications and                            allergies were reviewed. The patient's tolerance of                            previous anesthesia was also reviewed. The risks                            and benefits of the procedure and the sedation                            options and risks were discussed with the patient.                            All questions were answered, and informed consent                            was obtained. Prior Anticoagulants: The patient has                            taken no previous anticoagulant or antiplatelet                            agents. ASA Grade Assessment: III - A patient with                            severe systemic disease. After reviewing the risks                            and benefits, the patient was deemed in                            satisfactory condition to undergo the procedure.                           After obtaining informed consent, the endoscope was  passed under direct vision. Throughout the                            procedure, the patient's blood pressure, pulse, and                            oxygen saturations were monitored continuously. The                            Model GIF-HQ190 [id number]) scope was introduced                            through the mouth, and advanced to the second part                            of duodenum. The upper GI endoscopy was                             accomplished without difficulty. The patient                            tolerated the procedure well. Scope In: Scope Out: Findings:                 LA Grade B (one or more mucosal breaks greater than                            5 mm, not extending between the tops of two mucosal                            folds) esophagitis with no bleeding was found 34 to                            35 cm from the incisors.                           A small hiatal hernia was present.                           No gross lesions were noted in the entire examined                            stomach.                           The examined duodenum was normal. Complications:            No immediate complications. Estimated Blood Loss:     Estimated blood loss: none. Impression:               - LA Grade B erosive esophagitis.                           - Small hiatal hernia.                           -  No gross lesions in the stomach.                           - Normal examined duodenum.                           - No specimens collected. Recommendation:           - Patient has a contact number available for                            emergencies. The signs and symptoms of potential                            delayed complications were discussed with the                            patient. Return to normal activities tomorrow.                            Written discharge instructions were provided to the                            patient.                           - Resume previous diet.                           - Continue present medications.                           - Follow anti reflux measures                           - No aspirin, ibuprofen, naproxen, or other                            non-steroidal anti-inflammatory drugs.                           - Return to my office PRN. Mauri Pole, MD 09/28/2015 9:00:07 AM This report has been signed electronically.

## 2015-09-28 NOTE — Progress Notes (Signed)
To recovery, report to HJodges, RN, VSS

## 2015-09-29 ENCOUNTER — Telehealth: Payer: Self-pay

## 2015-09-29 NOTE — Telephone Encounter (Signed)
  Follow up Call-  Call back number 09/28/2015  Post procedure Call Back phone  # [phone]  Permission to leave phone message Yes  Some recent data might be hidden    Patient reports that she continues to be uncomfortable with gas. I recommended that she use an over the counter gas relief. I also encouraged [place] to walk, change her position, drink warm fluids. Patient was asked about her discomfort level and she said it was a 3. She states that she is not really in pain, just uncomfortable. She has returned to her normal daily activities.   Patient questions:  Do you have a fever, pain , or abdominal swelling? No. Pain Score  0 *  Have you tolerated food without any problems? Yes.    Have you been able to return to your normal activities? Yes.    Do you have any questions about your discharge instructions: Diet   No. Medications  No. Follow up visit  No.  Do you have questions or concerns about your Care? No.  Actions: * If pain score is 4 or above: No action needed, pain <4.

## 2015-10-07 ENCOUNTER — Other Ambulatory Visit: Payer: Self-pay | Admitting: Medical Oncology

## 2015-10-07 NOTE — Telephone Encounter (Signed)
Needs refill for tarceva. Sent to [facility].

## 2015-10-10 ENCOUNTER — Other Ambulatory Visit: Payer: Self-pay | Admitting: *Deleted

## 2015-10-10 ENCOUNTER — Other Ambulatory Visit: Payer: Self-pay | Admitting: Internal Medicine

## 2015-10-10 DIAGNOSIS — K1230 Oral mucositis (ulcerative), unspecified: Secondary | ICD-10-CM

## 2015-10-10 DIAGNOSIS — C7951 Secondary malignant neoplasm of bone: Secondary | ICD-10-CM

## 2015-10-10 DIAGNOSIS — C7931 Secondary malignant neoplasm of brain: Secondary | ICD-10-CM

## 2015-10-10 DIAGNOSIS — R21 Rash and other nonspecific skin eruption: Secondary | ICD-10-CM

## 2015-10-10 DIAGNOSIS — C3491 Malignant neoplasm of unspecified part of right bronchus or lung: Secondary | ICD-10-CM

## 2015-10-10 DIAGNOSIS — Z5111 Encounter for antineoplastic chemotherapy: Secondary | ICD-10-CM

## 2015-10-10 DIAGNOSIS — R630 Anorexia: Secondary | ICD-10-CM

## 2015-10-10 MED ORDER — ERLOTINIB HCL 100 MG PO TABS: 100.0000 mg | ORAL_TABLET | Freq: Every day | ORAL | 1 refills | Status: DC

## 2015-10-10 NOTE — Telephone Encounter (Signed)
RX faxed to [facility]

## 2015-10-11 ENCOUNTER — Encounter: Payer: Self-pay | Admitting: Internal Medicine

## 2015-10-11 MED FILL — CLINDAMYCIN PH 1% GEL: 1 | 10 days supply | Qty: 30 | Fill #0

## 2015-10-11 NOTE — Progress Notes (Signed)
Obtained signature from physician for patient assistance through [facility] for [facility]. Faxed to [facility]. Fax received ok per confirmation sheet.

## 2015-10-11 NOTE — Progress Notes (Signed)
Patient walk in with a note to ask for help with Medicaid. Patient has a a denial letter stating she was over-income and her Medicaid will end 10/15/15. Advised patient she will automatically receive a 55% discount for being uninsured and she can complete the [facility] FAA to see if she can qualify for an additional discount. Assisted patient with application which she completed. Patient brought supporting documents needed to submit with the financial assistance application except for her tax documents. She states she will bring those back today. Once she brings this information back, I will send interoffice mail to the business office. Assisted patient with emailing paystubs for her spouse and recent bank statement along with the others she had with her. Approved patient for one-time $400 [place]. Patient has a copy of the award letter as well as the expenses it covers and the outpatient pharmacy information. Advised patient that she would need to reserve some of her funds to assist with any oral medications that she may need prescribed by our doctor. Patient mentioned she gets Tarceva and its expensive. Helped patient enroll online for patient assistance through [place]. Will have doctor complete his portion and fax. Gave patient a copy of her portion. Patient has my card for any additional financial questions or concerns and will bring needed documents later.

## 2015-10-12 ENCOUNTER — Encounter: Payer: Self-pay | Admitting: *Deleted

## 2015-10-12 NOTE — Progress Notes (Signed)
[place] Work  Clinical Social Work met with patient in [place] office. CSW utilized interpreter service by phone. Patient requesting assistance understanding her recent Medicaid letter stating her Medicaid was no longer active due to over income limit.  Patient stated she has difficulty understanding due to language barrier and not understanding financial system.  CSW reviewed patient's Medicaid letter and financial documents.  CSW contacted DSS worker at [employer], stated patient is over income limit but may qualify for Medicaid once deductible is met.  CSW and patient attempted to contact Medicaid caseworker, M. Ray, left voicemail to return call as soon as possible.  Patient shared she is very worried to lose her health insurance. CSW listened and validated patients feelings of anxiety and fear.  CSW discussed problem solving techniques for Medicaid/insurance options and positive coping skills to address fear/worry.  CSW discussed case with Stefanie Libel, financial counselor.  Polo Riley, MSW, LCSW, OSW-C Clinical Social Worker [facility] [phone]

## 2015-10-13 ENCOUNTER — Telehealth: Payer: Self-pay | Admitting: *Deleted

## 2015-10-13 ENCOUNTER — Encounter: Payer: Self-pay | Admitting: Internal Medicine

## 2015-10-13 NOTE — Telephone Encounter (Signed)
Oncology Nurse Navigator Documentation  Oncology Nurse Navigator Flowsheets 10/13/2015  Navigator Location [facility]  Treatment Phase Treatment/[name] updated me that patient's insurance runs out at the end of Sept.  I updated oral pharmacy navigator.  I also called Deborah Foster to see how many Tarceva she has left.  I was unable to reach her and left her a vm message to call me.    Barriers/Navigation Needs Financial  Interventions Coordination of Care  Coordination of Care Other  Acuity Level 2  Acuity Level 2 Other  Time Spent with Patient 30

## 2015-10-13 NOTE — Progress Notes (Signed)
Patient brought in remaining pay stubs for her spouse to complete [facility] FAA. Sent [place] FAA via interoffice mail to Business office: Attn: Therapist, art.

## 2015-10-14 ENCOUNTER — Encounter: Payer: Self-pay | Admitting: Interventional Radiology

## 2015-10-14 ENCOUNTER — Telehealth: Payer: Self-pay | Admitting: *Deleted

## 2015-10-14 NOTE — Telephone Encounter (Signed)
Oncology Nurse Navigator Documentation  Oncology Nurse Navigator Flowsheets 10/14/2015  Navigator Location [facility]  Navigator Encounter Type Telephone/I called to check up on Deborah Foster and to understand how much of her medication for her lung cancer she has left.  I was unable to reach anyone or leave a vm message. I have notified oral chemo navigator pharmacist on Thursday about patient needing assistance with medication.   Telephone Outgoing Call  Treatment Phase Treatment  Barriers/Navigation Needs Financial  Interventions Coordination of Care  Coordination of Care Other  Acuity Level 2  Acuity Level 2 Other  Time Spent with Patient 15

## 2015-10-18 ENCOUNTER — Encounter: Payer: Self-pay | Admitting: *Deleted

## 2015-10-18 ENCOUNTER — Encounter: Payer: Self-pay | Admitting: Internal Medicine

## 2015-10-18 NOTE — Progress Notes (Signed)
Received approval from [facility] for [place] drug assistance. Drug will be shipped to the patient. Approval valid for one year. Copy placed to be scanned by HIM.

## 2015-10-18 NOTE — Progress Notes (Signed)
[place] Work  Patient stopped in [place] office to follow up on her financial status.  Patient shared she is very worried about not having insurance and her prescription of Tarceva running out on 10/9.  CSW shared information that was relayed to CSW and Stefanie Libel from DSS worker [place] by email.  Mrs. Salvaggio does not qualify for Medicaid because she is over the income limit.  The patient may qualify if she meets a medical deductible of [phone] and is able to prove her car is valued at a lower amount.  Patient plans to have car appraised by three different dealerships and provide to DSS.   CSW contacted pharmacy oral chemo navigator, she stated Stefanie Libel initiated application, need to follow up with Ms. Nyra Capes and request follow up.  CSW left voicemail for financial counselor Stefanie Libel requesting information regarding Tarceva assistance process and follow up to provide medical bill amount to DSS (may take several months to incur these expenses).    Polo Riley, MSW, LCSW, OSW-C Clinical Social Worker [facility] [phone]

## 2015-10-18 NOTE — Progress Notes (Signed)
Followed up on voicemail left by Lauren(SW). Called [employer] to check on status of application submitted for Tarceva. Spoke w/Ryan '@[employer]'$  whom stated the application was still in review status and it looks as though everything was submitted. He advised that someone would contact me within an hour with an update. Called and spoke Lauren to provide this update. Bianca from [place] called back to confirm information submitted on application and shipping details. Advised her to ship to patient and patient's brother Vonzella Althaus was listed as contact to speak with and he speaks [place]. Bianca needed to confirm medication allergies before shipment and states she would call brother. Wyatt Portela called back and states she was able to arrange shipment with [facility] but he didn't feel comfortable giving allergy information. Transferred call th desk RN to confirm these allergies. Once confirmed, approval letter will be faxed to me. Called Lauren and left voicemail with update that patient is approved and Tarceva will be shipped to her home.

## 2015-10-24 ENCOUNTER — Encounter: Payer: Self-pay | Admitting: Radiation Oncology

## 2015-10-24 ENCOUNTER — Ambulatory Visit (HOSPITAL_BASED_OUTPATIENT_CLINIC_OR_DEPARTMENT_OTHER): Payer: Medicaid Other | Admitting: Internal Medicine

## 2015-10-24 ENCOUNTER — Ambulatory Visit (HOSPITAL_BASED_OUTPATIENT_CLINIC_OR_DEPARTMENT_OTHER): Payer: Medicaid Other

## 2015-10-24 ENCOUNTER — Other Ambulatory Visit: Payer: Self-pay | Admitting: Radiation Therapy

## 2015-10-24 ENCOUNTER — Telehealth: Payer: Self-pay | Admitting: Internal Medicine

## 2015-10-24 ENCOUNTER — Encounter: Payer: Self-pay | Admitting: Internal Medicine

## 2015-10-24 ENCOUNTER — Ambulatory Visit
Admission: RE | Admit: 2015-10-24 | Discharge: 2015-10-24 | Disposition: A | Discharge status: Home / Self Care | Payer: Medicaid Other | Source: Ambulatory Visit | Attending: Radiation Oncology | Admitting: Radiation Oncology

## 2015-10-24 ENCOUNTER — Other Ambulatory Visit (HOSPITAL_BASED_OUTPATIENT_CLINIC_OR_DEPARTMENT_OTHER): Payer: Medicaid Other

## 2015-10-24 VITALS — BP 105/63 | HR 100 | Temp 98.0°F | Resp 18 | Ht 59.0 in | Wt 108.2 lb

## 2015-10-24 DIAGNOSIS — Z08 Encounter for follow-up examination after completed treatment for malignant neoplasm: Secondary | ICD-10-CM | POA: Diagnosis present

## 2015-10-24 DIAGNOSIS — E889 Metabolic disorder, unspecified: Secondary | ICD-10-CM

## 2015-10-24 DIAGNOSIS — M898X9 Other specified disorders of bone, unspecified site: Secondary | ICD-10-CM

## 2015-10-24 DIAGNOSIS — C7972 Secondary malignant neoplasm of left adrenal gland: Secondary | ICD-10-CM | POA: Diagnosis not present

## 2015-10-24 DIAGNOSIS — C3491 Malignant neoplasm of unspecified part of right bronchus or lung: Secondary | ICD-10-CM

## 2015-10-24 DIAGNOSIS — C7931 Secondary malignant neoplasm of brain: Secondary | ICD-10-CM

## 2015-10-24 DIAGNOSIS — Z5111 Encounter for antineoplastic chemotherapy: Secondary | ICD-10-CM

## 2015-10-24 DIAGNOSIS — C342 Malignant neoplasm of middle lobe, bronchus or lung: Secondary | ICD-10-CM

## 2015-10-24 DIAGNOSIS — C7951 Secondary malignant neoplasm of bone: Secondary | ICD-10-CM

## 2015-10-24 DIAGNOSIS — C7949 Secondary malignant neoplasm of other parts of nervous system: Principal | ICD-10-CM

## 2015-10-24 DIAGNOSIS — C3431 Malignant neoplasm of lower lobe, right bronchus or lung: Secondary | ICD-10-CM

## 2015-10-24 DIAGNOSIS — M9080 Osteopathy in diseases classified elsewhere, unspecified site: Secondary | ICD-10-CM

## 2015-10-24 LAB — COMPREHENSIVE METABOLIC PANEL
ALBUMIN: 3.6 g/dL (ref 3.5–5.0)
ALT: 20 U/L (ref 0–55)
AST: 21 U/L (ref 5–34)
Alkaline Phosphatase: 64 U/L (ref 40–150)
Anion Gap: 11 mEq/L (ref 3–11)
BUN: 13.2 mg/dL (ref 7.0–26.0)
CHLORIDE: 103 meq/L (ref 98–109)
CO2: 25 meq/L (ref 22–29)
Calcium: 9.5 mg/dL (ref 8.4–10.4)
Creatinine: 0.7 mg/dL (ref 0.6–1.1)
EGFR: >90 mL/min/{1.73_m2} (ref >90)
GLUCOSE: 92 mg/dL (ref 70–140)
POTASSIUM: 4.1 meq/L (ref 3.5–5.1)
SODIUM: 139 meq/L (ref 136–145)
Total Bilirubin: 0.75 mg/dL (ref 0.20–1.20)
Total Protein: 7.5 g/dL (ref 6.4–8.3)

## 2015-10-24 LAB — CBC WITH DIFFERENTIAL/PLATELET
BASO%: 0.4 % (ref 0.0–2.0)
BASOS ABS: 0 10*3/uL (ref 0.0–0.1)
EOS%: 4.7 % (ref 0.0–7.0)
Eosinophils Absolute: 0.3 10*3/uL (ref 0.0–0.5)
HCT: 44.5 % (ref 34.8–46.6)
HEMOGLOBIN: 15 g/dL (ref 11.6–15.9)
LYMPH%: 12.4 % — AB (ref 14.0–49.7)
MCH: 31.5 pg (ref 25.1–34.0)
MCHC: 33.8 g/dL (ref 31.5–36.0)
MCV: 93.3 fL (ref 79.5–101.0)
MONO#: 0.6 10*3/uL (ref 0.1–0.9)
MONO%: 7.7 % (ref 0.0–14.0)
NEUT#: 5.6 10*3/uL (ref 1.5–6.5)
NEUT%: 74.8 % (ref 38.4–76.8)
Platelets: 386 10*3/uL (ref 145–400)
RBC: 4.77 10*6/uL (ref 3.70–5.45)
RDW: 13.2 % (ref 11.2–14.5)
WBC: 7.4 10*3/uL (ref 3.9–10.3)
lymph#: 0.9 10*3/uL (ref 0.9–3.3)

## 2015-10-24 MED ORDER — DENOSUMAB 120 MG/1.7ML ~~LOC~~ SOLN
120.0000 mg | Freq: Once | SUBCUTANEOUS | Status: AC
  Administered 2015-10-24: 120 mg via SUBCUTANEOUS
  Filled 2015-10-24: qty 1.7

## 2015-10-24 NOTE — Progress Notes (Signed)
The patient presented today for discussion of her MRI of the brain for routine surveillance since completing whole brain radiotherapy. She did not have an MRI scan performed yet. Navigation was helpful in rescheduling her appointment today for an MRI brain and followup later this month.      Carola Rhine, PAC

## 2015-10-24 NOTE — Telephone Encounter (Signed)
GAVE PATIENT AVS REPORT AND APPOINTMENTS FOR November. [facility] WILL CALL RE SCAN.

## 2015-10-24 NOTE — Patient Instructions (Signed)
Denosumab injection  What is this medicine?  DENOSUMAB (den oh sue mab) slows bone breakdown. Prolia is used to treat osteoporosis in women after menopause and in men. Xgeva is used to prevent bone fractures and other bone problems caused by cancer bone metastases. Xgeva is also used to treat giant cell tumor of the bone.  This medicine may be used for other purposes; ask your health care provider or pharmacist if you have questions.  What should I tell my health care provider before I take this medicine?  They need to know if you have any of these conditions:  -dental disease  -eczema  -infection or history of infections  -kidney disease or on dialysis  -low blood calcium or vitamin D  -malabsorption syndrome  -scheduled to have surgery or tooth extraction  -taking medicine that contains denosumab  -thyroid or parathyroid disease  -an unusual reaction to denosumab, other medicines, foods, dyes, or preservatives  -pregnant or trying to get pregnant  -breast-feeding  How should I use this medicine?  This medicine is for injection under the skin. It is given by a health care professional in a hospital or clinic setting.  If you are getting Prolia, a special MedGuide will be given to you by the pharmacist with each prescription and refill. Be sure to read this information carefully each time.  For Prolia, talk to your pediatrician regarding the use of this medicine in children. Special care may be needed. For Xgeva, talk to your pediatrician regarding the use of this medicine in children. While this drug may be prescribed for children as young as 13 years for selected conditions, precautions do apply.  Overdosage: If you think you have taken too much of this medicine contact a poison control center or emergency room at once.  NOTE: This medicine is only for you. Do not share this medicine with others.  What if I miss a dose?  It is important not to miss your dose. Call your doctor or health care professional if you are  unable to keep an appointment.  What may interact with this medicine?  Do not take this medicine with any of the following medications:  -other medicines containing denosumab  This medicine may also interact with the following medications:  -medicines that suppress the immune system  -medicines that treat cancer  -steroid medicines like prednisone or cortisone  This list may not describe all possible interactions. Give your health care provider a list of all the medicines, herbs, non-prescription drugs, or dietary supplements you use. Also tell them if you smoke, drink alcohol, or use illegal drugs. Some items may interact with your medicine.  What should I watch for while using this medicine?  Visit your doctor or health care professional for regular checks on your progress. Your doctor or health care professional may order blood tests and other tests to see how you are doing.  Call your doctor or health care professional if you get a cold or other infection while receiving this medicine. Do not treat yourself. This medicine may decrease your body's ability to fight infection.  You should make sure you get enough calcium and vitamin D while you are taking this medicine, unless your doctor tells you not to. Discuss the foods you eat and the vitamins you take with your health care professional.  See your dentist regularly. Brush and floss your teeth as directed. Before you have any dental work done, tell your dentist you are receiving this medicine.  Do   not become pregnant while taking this medicine or for 5 months after stopping it. Women should inform their doctor if they wish to become pregnant or think they might be pregnant. There is a potential for serious side effects to an unborn child. Talk to your health care professional or pharmacist for more information.  What side effects may I notice from receiving this medicine?  Side effects that you should report to your doctor or health care professional as soon as  possible:  -allergic reactions like skin rash, itching or hives, swelling of the face, lips, or tongue  -breathing problems  -chest pain  -fast, irregular heartbeat  -feeling faint or lightheaded, falls  -fever, chills, or any other sign of infection  -muscle spasms, tightening, or twitches  -numbness or tingling  -skin blisters or bumps, or is dry, peels, or red  -slow healing or unexplained pain in the mouth or jaw  -unusual bleeding or bruising  Side effects that usually do not require medical attention (Report these to your doctor or health care professional if they continue or are bothersome.):  -muscle pain  -stomach upset, gas  This list may not describe all possible side effects. Call your doctor for medical advice about side effects. You may report side effects to FDA at 1-800-FDA-1088.  Where should I keep my medicine?  This medicine is only given in a clinic, doctor's office, or other health care setting and will not be stored at home.  NOTE: This sheet is a summary. It may not cover all possible information. If you have questions about this medicine, talk to your doctor, pharmacist, or health care provider.      2016, Elsevier/Gold Standard. (2011-07-02 12:37:47)

## 2015-10-24 NOTE — Progress Notes (Signed)
[place] [phone]   [phone]  OFFICE PROGRESS NOTE  [name], MD [place] [address]  DIAGNOSIS: Stage IV (T2a, N1, M1b) non-small cell lung cancer, adenocarcinoma presented with right middle lobe lung mass in addition to right hilar adenopathy as well as metastatic disease to the bone, brain as well as left adrenal metastasis diagnosed in July 2016  PRIOR THERAPY:  1) palliative radiotherapy to the brain as well as metastatic bone lesions in the pelvis. 2) Tarceva 150 mg by mouth daily started on 09/25/2014. Status post 5 months of treatment. 3) status post palliative radiotherapy to the right middle lobe lung mass for treatment of hemoptysis.  CURRENT THERAPY:  1) Tarceva 100 mg by mouth daily started on 02/19/2015. Status post 6 months of treatment. 2) Xgeva 120 g subcutaneously monthly.  INTERVAL HISTORY: RONNE SAVOIA 45 y.o. female returns to the clinic today for hospital follow-up visit accompanied by her interpreter. The patient is feeling much better today. The patient is tolerating her current treatment with Tarceva fairly well except for grade 1 skin rash mainly on the face. She denied having any significant chest pain, or shortness of breath. She has mild cough productive of dark yellowish sputum. She denied having any nausea or vomiting. She has occasional diarrhea. She is here today for evaluation with repeat blood work.  MEDICAL HISTORY: Past Medical History:  Diagnosis Date  . Bone metastasis ([place])   . Hemoptysis   . Hypokalemia   . lung ca dx'd 07/2014  . Lung mass   . Metastasis to adrenal gland ([place])   . Metastasis to brain ([place])   . Pneumonia   . Radiation 08/23/14-09/07/14   Brain/chest and left hip 30 Gy 12 Fx  . URI (upper respiratory infection) 02-02-2015    ALLERGIES:  is allergic to other and doxycycline.  MEDICATIONS:  Current Outpatient Prescriptions  Medication Sig  Dispense Refill  . Cyanocobalamin (VITAMIN B-12) 2500 MCG SUBL Place 2,500 mcg under the tongue daily.    Marland Kitchen ENSURE (ENSURE) Take 1 Can by mouth.    . erlotinib (TARCEVA) 100 MG tablet Take 1 tablet (100 mg total) by mouth daily. Take on an empty stomach 1 hour before meals or 2 hours after 30 tablet 1  . folic acid (FOLVITE) 510 MCG tablet Take 1 tablet (800 mcg total) by mouth daily. 30 tablet 1  . mometasone-formoterol (DULERA) 200-5 MCG/ACT AERO Inhale 2 puffs into the lungs 2 (two) times daily as needed. Reported on 07/29/2015    . Nutritional Supplements (JUICE PLUS FIBRE PO) Take 1 capsule by mouth daily. Reported on 07/28/2015    . TURMERIC PO Take 1 capsule by mouth 2 (two) times daily. Turmeric Superior 750 mg po bid    . albuterol (PROVENTIL HFA;VENTOLIN HFA) 108 (90 Base) MCG/ACT inhaler Inhale 2 puffs into the lungs every 6 (six) hours as needed for wheezing or shortness of breath. (Patient not taking: Reported on 10/24/2015) 1 Inhaler 0  . benzonatate (TESSALON) 100 MG capsule Take 1 capsule (100 mg total) by mouth 3 (three) times daily as needed for cough. (Patient not taking: Reported on 10/24/2015) 30 capsule 0  . chlorpheniramine-HYDROcodone (TUSSIONEX) 10-8 MG/5ML SUER Take 5 mLs by mouth every 12 (twelve) hours as needed for cough. (Patient not taking: Reported on 10/24/2015) 140 mL 0  . clindamycin (CLINDAGEL) 1 % gel Apply 1 application topically 2 (two) times daily.    Marland Kitchen  ferrous sulfate 325 (65 FE) MG EC tablet Take 325 mg by mouth daily with breakfast.    . loratadine (CLARITIN) 10 MG tablet Take 1 tablet (10 mg total) by mouth daily. (Patient not taking: Reported on 10/24/2015) 30 tablet 0   Current Facility-Administered Medications  Medication Dose Route Frequency Provider Last Rate Last Dose  . 0.9 %  sodium chloride infusion  500 mL Intravenous Continuous Mauri Pole, MD        SURGICAL HISTORY:  Past Surgical History:  Procedure Laterality Date  . IR GENERIC  HISTORICAL  07/14/2015   IR RADIOLOGIST EVAL & MGMT 07/14/2015 Greggory Keen, MD [facility] INTERV RAD  . VIDEO BRONCHOSCOPY Bilateral 08/09/2014   Procedure: VIDEO BRONCHOSCOPY WITHOUT FLUORO;  Surgeon: [name], MD;  Location: [facility] ENDOSCOPY;  Service: Cardiopulmonary;  Laterality: Bilateral;  . VIDEO BRONCHOSCOPY Bilateral 07/06/2015   Procedure: VIDEO BRONCHOSCOPY WITHOUT FLUORO;  Surgeon: [name], MD;  Location: [facility] ENDOSCOPY;  Service: Cardiopulmonary;  Laterality: Bilateral;    REVIEW OF SYSTEMS:  A comprehensive review of systems was negative except for: Integument/breast: positive for rash   PHYSICAL EXAMINATION: General appearance: alert, cooperative, fatigued and no distress Head: Normocephalic, without obvious abnormality, atraumatic Neck: no adenopathy, no JVD, supple, symmetrical, trachea midline and thyroid not enlarged, symmetric, no tenderness/mass/nodules Lymph nodes: Cervical, supraclavicular, and axillary nodes normal. Resp: clear to auscultation bilaterally Back: symmetric, no curvature. ROM normal. No CVA tenderness. Cardio: regular rate and rhythm, S1, S2 normal, no murmur, click, rub or gallop GI: soft, non-tender; bowel sounds normal; no masses,  no organomegaly Extremities: extremities normal, atraumatic, no cyanosis or edema Neurologic: Alert and oriented X 3, normal strength and tone. Normal symmetric reflexes. Normal coordination and gait Skin exam showed maculopapular grade 1 skin rash on the face and upper chest.  ECOG PERFORMANCE STATUS: 1 - Symptomatic but completely ambulatory  Blood pressure 105/63, pulse 100, temperature 98 F (36.7 C), temperature source Oral, resp. rate 18, height '4\' 11"'$  (1.499 m), weight 108 lb 3.2 oz (49.1 kg), SpO2 98 %.  LABORATORY DATA: Lab Results  Component Value Date   WBC 7.4 10/24/2015   HGB 15.0 10/24/2015   HCT 44.5 10/24/2015   MCV 93.3 10/24/2015   PLT 386 10/24/2015      Chemistry      Component Value  Date/Time   NA 140 09/26/2015 0919   K 3.9 09/26/2015 0919   CL 109 07/19/2015 0509   CO2 26 09/26/2015 0919   BUN 13.0 09/26/2015 0919   CREATININE 0.6 09/26/2015 0919      Component Value Date/Time   CALCIUM 9.0 09/26/2015 0919   ALKPHOS 67 09/26/2015 0919   AST 17 09/26/2015 0919   ALT 17 09/26/2015 0919   BILITOT 0.57 09/26/2015 0919       RADIOGRAPHIC STUDIES: No results found.  ASSESSMENT AND PLAN: This is a very pleasant 45 years old Asian female never smoker with recently diagnosed with stage IV non-small cell lung cancer, adenocarcinoma presented with right middle lobe lung mass in addition to right hilar adenopathy and metastatic disease to the bones, brain and left adrenal gland.  She status post whole brain irradiation with some improvement in her disease on the recent MRI of the brain. The patient completed treatment with Tarceva 150 mg by mouth daily for 5 months and tolerating it fairly well except for grade 2 skin rashAnd diarrhea. She is currently on Tarceva 100 mg by mouth daily started 02/19/2015. She is tolerating  the lower dose of Tarceva much better. She status post 7 months. I recommended for her to continue her current treatment with Tarceva 100 mg by mouth daily. For the metastatic bone lesions, she started treatment with Delton See and she is tolerating it well.  She will come back for follow-up visit in one month for reevaluation with repeat blood workAs well as CT scan of the chest, abdomen and pelvis for restaging of her disease. She was advised to call immediately if she has any concerning symptoms in the interval. The patient voices understanding of current disease status and treatment options and is in agreement with the current care plan.  All questions were answered. The patient knows to call the clinic with any problems, questions or concerns. We can certainly see the patient much sooner if necessary.  Disclaimer: This note was dictated with voice  recognition software. Similar sounding words can inadvertently be transcribed and may not be corrected upon review.

## 2015-10-24 NOTE — Progress Notes (Signed)
Ms. Vandezande here for reassessment S/P XRT to right hilar region.  She reports Intermittent epigastric pain, but none at this time.  Has picture of thick yellow mucus that she expectorated a few days ago and was seen by Dr. Julien Nordmann today and he felt it was from inflammation. VSS. Denies any fatigue.  She reports that food does not taste good, but is maintaining her weight.   BP (!) 99/55   Pulse 83   Temp 97.8 F (36.6 C) (Oral)   Ht '4\' 11"'$  (1.499 m)   Wt 107 lb (48.5 kg)   BMI 21.61 kg/m    Wt Readings from Last 3 Encounters:  10/24/15 107 lb (48.5 kg)  10/24/15 108 lb 3.2 oz (49.1 kg)  09/28/15 108 lb (49 kg)

## 2015-11-02 ENCOUNTER — Telehealth: Payer: Self-pay

## 2015-11-02 NOTE — Telephone Encounter (Signed)
Notes and ROI printed for pt to pick up at front desk, pt informed.

## 2015-11-11 ENCOUNTER — Encounter: Payer: Self-pay | Admitting: *Deleted

## 2015-11-11 ENCOUNTER — Ambulatory Visit (HOSPITAL_COMMUNITY)
Admission: RE | Admit: 2015-11-11 | Discharge: 2015-11-11 | Disposition: A | Discharge status: Home / Self Care | Payer: Medicaid Other | Source: Ambulatory Visit | Attending: Radiation Oncology | Admitting: Radiation Oncology

## 2015-11-11 DIAGNOSIS — C7949 Secondary malignant neoplasm of other parts of nervous system: Secondary | ICD-10-CM | POA: Insufficient documentation

## 2015-11-11 DIAGNOSIS — C7931 Secondary malignant neoplasm of brain: Secondary | ICD-10-CM | POA: Diagnosis not present

## 2015-11-11 MED ORDER — GADOBENATE DIMEGLUMINE 529 MG/ML IV SOLN
10.0000 mL | Freq: Once | INTRAVENOUS | Status: AC
  Administered 2015-11-11: 10 mL via INTRAVENOUS

## 2015-11-11 NOTE — Progress Notes (Signed)
Oncology Nurse Navigator Documentation  Oncology Nurse Navigator Flowsheets 11/11/2015  Navigator Location [facility]  Navigator Encounter Type Other/I followed up on Deborah Foster and if her CT Chest Ab Pelvis was scheduled. It is not scheduled.  I contacted pre cert coordinator so see if this was authorized.  Will wait to hear from her before scheduling.   Treatment Phase Treatment  Barriers/Navigation Needs Coordination of Care  Interventions Coordination of Care  Coordination of Care Radiology;Appts  Acuity Level 2  Acuity Level 2 Other  Time Spent with Patient 30

## 2015-11-14 ENCOUNTER — Other Ambulatory Visit: Payer: Self-pay | Admitting: Medical Oncology

## 2015-11-14 ENCOUNTER — Ambulatory Visit
Admission: RE | Admit: 2015-11-14 | Discharge: 2015-11-14 | Disposition: A | Discharge status: Home / Self Care | Payer: Medicaid Other | Source: Ambulatory Visit | Attending: Radiation Oncology | Admitting: Radiation Oncology

## 2015-11-14 VITALS — BP 93/52 | HR 81 | Temp 97.6°F | Resp 16 | Ht 59.0 in | Wt 109.2 lb

## 2015-11-14 DIAGNOSIS — K209 Esophagitis, unspecified: Secondary | ICD-10-CM | POA: Insufficient documentation

## 2015-11-14 DIAGNOSIS — C342 Malignant neoplasm of middle lobe, bronchus or lung: Secondary | ICD-10-CM | POA: Diagnosis not present

## 2015-11-14 DIAGNOSIS — Z923 Personal history of irradiation: Secondary | ICD-10-CM | POA: Diagnosis not present

## 2015-11-14 DIAGNOSIS — R1310 Dysphagia, unspecified: Secondary | ICD-10-CM | POA: Diagnosis not present

## 2015-11-14 DIAGNOSIS — R630 Anorexia: Secondary | ICD-10-CM

## 2015-11-14 DIAGNOSIS — C7931 Secondary malignant neoplasm of brain: Secondary | ICD-10-CM | POA: Insufficient documentation

## 2015-11-14 DIAGNOSIS — M546 Pain in thoracic spine: Secondary | ICD-10-CM | POA: Insufficient documentation

## 2015-11-14 DIAGNOSIS — C7951 Secondary malignant neoplasm of bone: Secondary | ICD-10-CM

## 2015-11-14 DIAGNOSIS — C3491 Malignant neoplasm of unspecified part of right bronchus or lung: Secondary | ICD-10-CM

## 2015-11-14 MED ORDER — ERLOTINIB HCL 100 MG PO TABS
100.0000 mg | ORAL_TABLET | Freq: Every day | ORAL | 1 refills | Status: DC
Start: 2015-11-14 — End: 2015-11-22

## 2015-11-14 NOTE — Addendum Note (Signed)
Encounter addended by: Benn Moulder, RN on: 11/14/2015 12:23 PM<BR>    Actions taken: Charge Capture section accepted

## 2015-11-14 NOTE — Progress Notes (Signed)
Deborah Foster here for results of her 11/10/17 MRI of the brain.    Cough: less cough since her last visit., but reports yellow phlegm last week, none today Intermittent itching of her ears.  Hemoptysis: None Headache: No Ataxia: No  Vision changes: No  BP (!) 93/52 (BP Location: Left Arm, Patient Position: Sitting, Cuff Size: Normal)   Pulse 81   Temp 97.6 F (36.4 C) (Oral)   Resp 16   Ht '4\' 11"'$  (1.499 m)   Wt 109 lb 3.2 oz (49.5 kg)   SpO2 100%   BMI 22.06 kg/m    Wt Readings from Last 3 Encounters:  11/14/15 109 lb 3.2 oz (49.5 kg)  10/24/15 107 lb (48.5 kg)  10/24/15 108 lb 3.2 oz (49.1 kg)

## 2015-11-14 NOTE — Addendum Note (Signed)
Encounter addended by: Benn Moulder, RN on: 11/14/2015  3:54 PM<BR>    Actions taken: Chief Complaint modified, Visit Navigator Flowsheet section accepted

## 2015-11-14 NOTE — Progress Notes (Signed)
Radiation Oncology         [phone] ________________________________  Name: Deborah Foster MRN: [id number]  Date: 11/14/2015  DOB: 1970/09/17  Follow up Note  CC: [name], MD  [name], *  Diagnosis:   Stage IV T2a, N1, M1b NSCLC, adenocarcinoma of the right middle lobe with metastatic disease to bone and brain.  Interval Since Last Radiation:  3 months   07/21/2015 to 08/03/2015:The Right hilar tumor was treated to 30 Gy in 10 fractions at 3 Gy per fraction.   08/23/14-09/07/14: 30 Gy in 12 fractions was prescribed to the right lung, whole brain, and left hip.  Narrative:  The patient returns today for routine follow-up. Since her last visit, she states that she's been doing very well. She remains on Tarceva and Xgeva with Dr. Julien Nordmann and sees him in about 2 weeks. She comes today to review her MRI scan of the brain on  11/11/15. This did not reveal any progressive or new metastatic disease in the brain.                         On review of systems, the patient states she is doing extremely well. She states that she is going to be seen by GI specialist due to some difficulty swallowing. She states that this has been present for the last few months, and continues to get slightly better but not significantly improved since her last visit. She denies any chest pain or shortness of breath. She has noticed some discomfort over her thoracic spine as this morning, but reports that she has not had any fevers or chills or productive mucus. Last week however she did have some yellow phlegm. She reports intermittent itching of her ears, she denies any headaches, visual disturbances, auditory disturbances. She denies any abdominal pain, nausea, vomiting. She denies any bowel or bladder dysfunction, a complete review of systems is obtained and is otherwise negative.  Past Medical History:  Past Medical History:  Diagnosis Date  . Bone metastasis ([place])   . Hemoptysis   .  Hypokalemia   . lung ca dx'd 07/2014  . Lung mass   . Metastasis to adrenal gland ([place])   . Metastasis to brain ([place])   . Pneumonia   . Radiation 08/23/14-09/07/14   Brain/chest and left hip 30 Gy 12 Fx  . URI (upper respiratory infection) 02-02-2015    Past Surgical History: Past Surgical History:  Procedure Laterality Date  . IR GENERIC HISTORICAL  07/14/2015   IR RADIOLOGIST EVAL & MGMT 07/14/2015 Greggory Keen, MD [facility] INTERV RAD  . VIDEO BRONCHOSCOPY Bilateral 08/09/2014   Procedure: VIDEO BRONCHOSCOPY WITHOUT FLUORO;  Surgeon: [name], MD;  Location: [facility] ENDOSCOPY;  Service: Cardiopulmonary;  Laterality: Bilateral;  . VIDEO BRONCHOSCOPY Bilateral 07/06/2015   Procedure: VIDEO BRONCHOSCOPY WITHOUT FLUORO;  Surgeon: [name], MD;  Location: [facility] ENDOSCOPY;  Service: Cardiopulmonary;  Laterality: Bilateral;    Social History:  Social History   Social History  . Marital status: Married    Spouse name: N/A  . Number of children: 3  . Years of education: N/A   Occupational History  . unemployed    Social History Main Topics  . Smoking status: Never Smoker  . Smokeless tobacco: Never Used  . Alcohol use No  . Drug use: No  . Sexual activity: Not Currently   Other Topics Concern  . Not on file   Social History Narrative  Patient moved to [place] in 1990.   Had been in [place] since that time.    Family History: Family History  Problem Relation Age of Onset  . Hyperlipidemia Mother     ALLERGIES:  is allergic to other and doxycycline.  Meds: Current Outpatient Prescriptions  Medication Sig Dispense Refill  . clindamycin (CLINDAGEL) 1 % gel Apply 1 application topically 2 (two) times daily.    . Cyanocobalamin (VITAMIN B-12) 2500 MCG SUBL Place 2,500 mcg under the tongue daily.    Marland Kitchen ENSURE (ENSURE) Take 1 Can by mouth.    . erlotinib (TARCEVA) 100 MG tablet Take 1 tablet (100 mg total) by mouth daily. Take on an empty stomach 1 hour  before meals or 2 hours after 30 tablet 1  . ferrous sulfate 325 (65 FE) MG EC tablet Take 325 mg by mouth daily with breakfast.    . folic acid (FOLVITE) 458 MCG tablet Take 1 tablet (800 mcg total) by mouth daily. 30 tablet 1  . loratadine (CLARITIN) 10 MG tablet Take 1 tablet (10 mg total) by mouth daily. 30 tablet 0  . Nutritional Supplements (JUICE PLUS FIBRE PO) Take 1 capsule by mouth daily. Reported on 07/28/2015    . TURMERIC PO Take 1 capsule by mouth 2 (two) times daily. Turmeric Superior 750 mg po bid     Current Facility-Administered Medications  Medication Dose Route Frequency Provider Last Rate Last Dose  . 0.9 %  sodium chloride infusion  500 mL Intravenous Continuous Mauri Pole, MD        Physical Findings:  height is '4\' 11"'$  (1.499 m) and weight is 109 lb 3.2 oz (49.5 kg). Her oral temperature is 97.6 F (36.4 C). Her blood pressure is 93/52 (abnormal) and her pulse is 81. Her respiration is 16 and oxygen saturation is 100%.  In general this is a well appearing Asian female in no acute distress. She's alert and oriented x4 and appropriate throughout the examination. Cardiopulmonary assessment is negative for acute distress and she exhibits normal effort. No palpable abnormalities are noted of the thoracic spine, and no reproducible pain is noted.  Lab Findings: Lab Results  Component Value Date   WBC 7.4 10/24/2015   HGB 15.0 10/24/2015   HCT 44.5 10/24/2015   MCV 93.3 10/24/2015   PLT 386 10/24/2015     Radiographic Findings: Mr Jeri Cos KD Contrast  Result Date: 11/11/2015 CLINICAL DATA:  45 y/o F; metastatic lung cancer status post radiation to brain metastasis. SRS protocol. EXAM: MRI HEAD WITHOUT AND WITH CONTRAST TECHNIQUE: Multiplanar, multiecho pulse sequences of the brain and surrounding structures were obtained without and with intravenous contrast. CONTRAST:  17m MULTIHANCE GADOBENATE DIMEGLUMINE 529 MG/ML IV SOLN COMPARISON:  08/11/2015 MRI of the  head FINDINGS: Brain: Left posterior frontal white matter T2 hyperintense focus has decreased in size with new small focus of cystic change compatible with chronic infarct. There are several hemorrhagic lesions throughout the supratentorial brain and small focus in the left medial cerebellar hemisphere that are stable or decreased in size in comparison with prior MRI of the brain . No new focus of abnormal enhancement, diffusion restriction, T2 FLAIR signal abnormality, or susceptibility hypointensity is identified. Vascular: Normal flow voids. Skull and upper cervical spine: There are several subcentimeter T1 hyperintense foci in bone marrow which may represent focal fat or hemangioma. No enhancing metastatic lesion identified. Sinuses/Orbits: Negative. Other: None. IMPRESSION: 1. Multiple stable hemorrhagic metastasis. No new metastatic deposit identified.  2. Decreased focus of T2 FLAIR signal abnormality in the left posterior frontal white matter without enhancement and compatible with small chronic infarct. 3. Otherwise unremarkable MRI of the brain. Electronically Signed   By: Kristine Garbe M.D.   On: 11/11/2015 13:55    Impression/Plan: 1. Stage IV T2a, N1, M1b NSCLC, adenocarcinoma of the right middle lobe with metastatic disease to bone and brain. The patient appears to be doing well overall. At this point she appears to be radiographically stable, we would continue to recommend repeat imaging of the brain in 3 months intervals. She will return at that time and keep [place] informed any questions or concerns that arise prior to that visit. 2. Esophagitis. I'm suspicious that she still has changes consistent with radiation esophagitis. I encouraged her to try Carafate that she still has some of this medication left over from her treatment. We will continue to follow this expectantly and she will also see GI later in November. 3. Mid thoracic back pain. The patient was counseled on the importance  of keeping [place] informed of her symptoms, and if she has progressive symptoms, we would consider imaging. I'm not sure Dr. Julien Nordmann plans for her next imaging scans, but it appears that there are several scans ordered. We will continue to follow up with this if her symptoms progress and discuss this at subsequent visits.    Carola Rhine, PAC

## 2015-11-16 ENCOUNTER — Encounter: Payer: Self-pay | Admitting: Internal Medicine

## 2015-11-16 NOTE — Progress Notes (Signed)
Placed prescription for Tarceva on physician's desk to complete and return to me to fax to [facility].

## 2015-11-22 ENCOUNTER — Ambulatory Visit (HOSPITAL_BASED_OUTPATIENT_CLINIC_OR_DEPARTMENT_OTHER): Payer: Medicaid Other

## 2015-11-22 ENCOUNTER — Ambulatory Visit (HOSPITAL_COMMUNITY)
Admission: RE | Admit: 2015-11-22 | Discharge: 2015-11-22 | Disposition: A | Discharge status: Home / Self Care | Payer: Medicaid Other | Source: Ambulatory Visit | Attending: Internal Medicine | Admitting: Internal Medicine

## 2015-11-22 ENCOUNTER — Other Ambulatory Visit (HOSPITAL_BASED_OUTPATIENT_CLINIC_OR_DEPARTMENT_OTHER): Payer: Medicaid Other

## 2015-11-22 ENCOUNTER — Other Ambulatory Visit: Payer: Self-pay | Admitting: Medical Oncology

## 2015-11-22 ENCOUNTER — Encounter (HOSPITAL_COMMUNITY): Payer: Self-pay

## 2015-11-22 VITALS — BP 100/52 | HR 85 | Temp 98.2°F | Resp 18

## 2015-11-22 DIAGNOSIS — C342 Malignant neoplasm of middle lobe, bronchus or lung: Secondary | ICD-10-CM

## 2015-11-22 DIAGNOSIS — C3431 Malignant neoplasm of lower lobe, right bronchus or lung: Secondary | ICD-10-CM

## 2015-11-22 DIAGNOSIS — E278 Other specified disorders of adrenal gland: Secondary | ICD-10-CM | POA: Diagnosis not present

## 2015-11-22 DIAGNOSIS — C3491 Malignant neoplasm of unspecified part of right bronchus or lung: Secondary | ICD-10-CM | POA: Diagnosis present

## 2015-11-22 DIAGNOSIS — C7931 Secondary malignant neoplasm of brain: Secondary | ICD-10-CM

## 2015-11-22 DIAGNOSIS — C7951 Secondary malignant neoplasm of bone: Secondary | ICD-10-CM

## 2015-11-22 DIAGNOSIS — M898X9 Other specified disorders of bone, unspecified site: Secondary | ICD-10-CM

## 2015-11-22 DIAGNOSIS — E889 Metabolic disorder, unspecified: Secondary | ICD-10-CM

## 2015-11-22 DIAGNOSIS — M9080 Osteopathy in diseases classified elsewhere, unspecified site: Secondary | ICD-10-CM

## 2015-11-22 DIAGNOSIS — R630 Anorexia: Secondary | ICD-10-CM

## 2015-11-22 LAB — COMPREHENSIVE METABOLIC PANEL
ALT: 19 U/L (ref 0–55)
ANION GAP: 9 meq/L (ref 3–11)
AST: 20 U/L (ref 5–34)
Albumin: 3.6 g/dL (ref 3.5–5.0)
Alkaline Phosphatase: 59 U/L (ref 40–150)
BUN: 8.7 mg/dL (ref 7.0–26.0)
CHLORIDE: 106 meq/L (ref 98–109)
CO2: 26 meq/L (ref 22–29)
Calcium: 9.1 mg/dL (ref 8.4–10.4)
Creatinine: 0.7 mg/dL (ref 0.6–1.1)
GLUCOSE: 91 mg/dL (ref 70–140)
Potassium: 4.3 mEq/L (ref 3.5–5.1)
SODIUM: 141 meq/L (ref 136–145)
Total Bilirubin: 0.71 mg/dL (ref 0.20–1.20)
Total Protein: 7.5 g/dL (ref 6.4–8.3)

## 2015-11-22 LAB — CBC WITH DIFFERENTIAL/PLATELET
BASO%: 0.9 % (ref 0.0–2.0)
Basophils Absolute: 0 10*3/uL (ref 0.0–0.1)
EOS%: 9.2 % — ABNORMAL HIGH (ref 0.0–7.0)
Eosinophils Absolute: 0.5 10*3/uL (ref 0.0–0.5)
HCT: 45.1 % (ref 34.8–46.6)
HGB: 15.3 g/dL (ref 11.6–15.9)
LYMPH%: 19.5 % (ref 14.0–49.7)
MCH: 31.9 pg (ref 25.1–34.0)
MCHC: 33.9 g/dL (ref 31.5–36.0)
MCV: 94.2 fL (ref 79.5–101.0)
MONO#: 0.4 10*3/uL (ref 0.1–0.9)
MONO%: 7.5 % (ref 0.0–14.0)
NEUT#: 3.4 10*3/uL (ref 1.5–6.5)
NEUT%: 62.9 % (ref 38.4–76.8)
PLATELETS: 377 10*3/uL (ref 145–400)
RBC: 4.79 10*6/uL (ref 3.70–5.45)
RDW: 13.4 % (ref 11.2–14.5)
WBC: 5.5 10*3/uL (ref 3.9–10.3)
lymph#: 1.1 10*3/uL (ref 0.9–3.3)

## 2015-11-22 MED ORDER — IOPAMIDOL (ISOVUE-300) INJECTION 61%
100.0000 mL | Freq: Once | INTRAVENOUS | Status: AC | PRN
Start: 2015-11-22 — End: 2015-11-22
  Administered 2015-11-22: 100 mL via INTRAVENOUS

## 2015-11-22 MED ORDER — ERLOTINIB HCL 100 MG PO TABS: 100.0000 mg | ORAL_TABLET | Freq: Every day | ORAL | 1 refills | Status: DC

## 2015-11-22 MED ORDER — DENOSUMAB 120 MG/1.7ML ~~LOC~~ SOLN
120.0000 mg | Freq: Once | SUBCUTANEOUS | Status: AC
  Administered 2015-11-22: 120 mg via SUBCUTANEOUS
  Filled 2015-11-22: qty 1.7

## 2015-11-22 NOTE — Patient Instructions (Signed)
Denosumab injection  What is this medicine?  DENOSUMAB (den oh sue mab) slows bone breakdown. Prolia is used to treat osteoporosis in women after menopause and in men. Xgeva is used to prevent bone fractures and other bone problems caused by cancer bone metastases. Xgeva is also used to treat giant cell tumor of the bone.  This medicine may be used for other purposes; ask your health care provider or pharmacist if you have questions.  What should I tell my health care provider before I take this medicine?  They need to know if you have any of these conditions:  -dental disease  -eczema  -infection or history of infections  -kidney disease or on dialysis  -low blood calcium or vitamin D  -malabsorption syndrome  -scheduled to have surgery or tooth extraction  -taking medicine that contains denosumab  -thyroid or parathyroid disease  -an unusual reaction to denosumab, other medicines, foods, dyes, or preservatives  -pregnant or trying to get pregnant  -breast-feeding  How should I use this medicine?  This medicine is for injection under the skin. It is given by a health care professional in a hospital or clinic setting.  If you are getting Prolia, a special MedGuide will be given to you by the pharmacist with each prescription and refill. Be sure to read this information carefully each time.  For Prolia, talk to your pediatrician regarding the use of this medicine in children. Special care may be needed. For Xgeva, talk to your pediatrician regarding the use of this medicine in children. While this drug may be prescribed for children as young as 13 years for selected conditions, precautions do apply.  Overdosage: If you think you have taken too much of this medicine contact a poison control center or emergency room at once.  NOTE: This medicine is only for you. Do not share this medicine with others.  What if I miss a dose?  It is important not to miss your dose. Call your doctor or health care professional if you are  unable to keep an appointment.  What may interact with this medicine?  Do not take this medicine with any of the following medications:  -other medicines containing denosumab  This medicine may also interact with the following medications:  -medicines that suppress the immune system  -medicines that treat cancer  -steroid medicines like prednisone or cortisone  This list may not describe all possible interactions. Give your health care provider a list of all the medicines, herbs, non-prescription drugs, or dietary supplements you use. Also tell them if you smoke, drink alcohol, or use illegal drugs. Some items may interact with your medicine.  What should I watch for while using this medicine?  Visit your doctor or health care professional for regular checks on your progress. Your doctor or health care professional may order blood tests and other tests to see how you are doing.  Call your doctor or health care professional if you get a cold or other infection while receiving this medicine. Do not treat yourself. This medicine may decrease your body's ability to fight infection.  You should make sure you get enough calcium and vitamin D while you are taking this medicine, unless your doctor tells you not to. Discuss the foods you eat and the vitamins you take with your health care professional.  See your dentist regularly. Brush and floss your teeth as directed. Before you have any dental work done, tell your dentist you are receiving this medicine.  Do   not become pregnant while taking this medicine or for 5 months after stopping it. Women should inform their doctor if they wish to become pregnant or think they might be pregnant. There is a potential for serious side effects to an unborn child. Talk to your health care professional or pharmacist for more information.  What side effects may I notice from receiving this medicine?  Side effects that you should report to your doctor or health care professional as soon as  possible:  -allergic reactions like skin rash, itching or hives, swelling of the face, lips, or tongue  -breathing problems  -chest pain  -fast, irregular heartbeat  -feeling faint or lightheaded, falls  -fever, chills, or any other sign of infection  -muscle spasms, tightening, or twitches  -numbness or tingling  -skin blisters or bumps, or is dry, peels, or red  -slow healing or unexplained pain in the mouth or jaw  -unusual bleeding or bruising  Side effects that usually do not require medical attention (Report these to your doctor or health care professional if they continue or are bothersome.):  -muscle pain  -stomach upset, gas  This list may not describe all possible side effects. Call your doctor for medical advice about side effects. You may report side effects to FDA at 1-800-FDA-1088.  Where should I keep my medicine?  This medicine is only given in a clinic, doctor's office, or other health care setting and will not be stored at home.  NOTE: This sheet is a summary. It may not cover all possible information. If you have questions about this medicine, talk to your doctor, pharmacist, or health care provider.      2016, Elsevier/Gold Standard. (2011-07-02 12:37:47)

## 2015-11-23 ENCOUNTER — Encounter: Payer: Self-pay | Admitting: Internal Medicine

## 2015-11-23 NOTE — Progress Notes (Signed)
Faxed signed prescription for Tarceva to [facility]. Fax received ok per confirmation sheet.

## 2015-11-28 ENCOUNTER — Encounter: Payer: Self-pay | Admitting: Internal Medicine

## 2015-11-28 NOTE — Progress Notes (Signed)
Re-faxed signed prescription for Tarceva to [facility]. Fax received ok per confirmation sheet.

## 2015-11-29 ENCOUNTER — Encounter: Payer: Self-pay | Admitting: Internal Medicine

## 2015-11-29 ENCOUNTER — Other Ambulatory Visit: Payer: Self-pay | Admitting: Medical Oncology

## 2015-11-29 ENCOUNTER — Telehealth: Payer: Self-pay | Admitting: Internal Medicine

## 2015-11-29 ENCOUNTER — Ambulatory Visit (HOSPITAL_BASED_OUTPATIENT_CLINIC_OR_DEPARTMENT_OTHER): Payer: Medicaid Other | Admitting: Internal Medicine

## 2015-11-29 VITALS — BP 96/57 | HR 101 | Temp 97.7°F | Resp 18 | Ht 59.0 in | Wt 108.3 lb

## 2015-11-29 DIAGNOSIS — Z5111 Encounter for antineoplastic chemotherapy: Secondary | ICD-10-CM

## 2015-11-29 DIAGNOSIS — C3490 Malignant neoplasm of unspecified part of unspecified bronchus or lung: Secondary | ICD-10-CM

## 2015-11-29 DIAGNOSIS — C7951 Secondary malignant neoplasm of bone: Secondary | ICD-10-CM

## 2015-11-29 DIAGNOSIS — C342 Malignant neoplasm of middle lobe, bronchus or lung: Secondary | ICD-10-CM | POA: Diagnosis not present

## 2015-11-29 DIAGNOSIS — R21 Rash and other nonspecific skin eruption: Secondary | ICD-10-CM | POA: Diagnosis not present

## 2015-11-29 DIAGNOSIS — C3491 Malignant neoplasm of unspecified part of right bronchus or lung: Secondary | ICD-10-CM

## 2015-11-29 MED ORDER — BENZONATATE 100 MG PO CAPS: 100.0000 mg | ORAL_CAPSULE | Freq: Three times a day (TID) | ORAL | 0 refills | Status: DC | PRN

## 2015-11-29 MED FILL — BENZONATATE 100 MG CAPSULE: 100 | 7 days supply | Qty: 20 | Fill #0

## 2015-11-29 NOTE — Progress Notes (Signed)
[place] [phone]   Fax:([phone]  OFFICE PROGRESS NOTE  [name], MD [place] [address]  DIAGNOSIS: Stage IV (T2a, N1, M1b) non-small cell lung cancer, adenocarcinoma presented with right middle lobe lung mass in addition to right hilar adenopathy as well as metastatic disease to the bone, brain as well as left adrenal metastasis diagnosed in July 2016  PRIOR THERAPY:  1) palliative radiotherapy to the brain as well as metastatic bone lesions in the pelvis. 2) Tarceva 150 mg by mouth daily started on 09/25/2014. Status post 5 months of treatment. 3) status post palliative radiotherapy to the right middle lobe lung mass for treatment of hemoptysis.  CURRENT THERAPY:  1) Tarceva 100 mg by mouth daily started on 02/19/2015. Status post 9 months of treatment. 2) Xgeva 120 g subcutaneously monthly.  INTERVAL HISTORY: Deborah Foster 45 y.o. female returns to the clinic today for follow-up visit. The patient is feeling much better today. He complains she has today is mild cough and she would like refill Tessalon. The patient is tolerating her current treatment with Tarceva fairly well except for grade 1 skin rash mainly on the face. She denied having any significant chest pain, or shortness of breath. She has mild cough productive of dark yellowish sputum. She denied having any nausea or vomiting. She has occasional diarrhea. She had repeat CT scan of the chest, abdomen and pelvis performed recently and she is here for evaluation and discussion of her scan results.  MEDICAL HISTORY: Past Medical History:  Diagnosis Date  . Bone metastasis ([place])   . Hemoptysis   . Hypokalemia   . lung ca dx'd 07/2014  . Lung mass   . Metastasis to adrenal gland ([place])   . Metastasis to brain ([place])   . Pneumonia   . Radiation 08/23/14-09/07/14   Brain/chest and left hip 30 Gy 12 Fx  . URI (upper respiratory infection) 02-02-2015     ALLERGIES:  is allergic to other and doxycycline.  MEDICATIONS:  Current Outpatient Prescriptions  Medication Sig Dispense Refill  . clindamycin (CLINDAGEL) 1 % gel Apply 1 application topically 2 (two) times daily.    . Cyanocobalamin (VITAMIN B-12) 2500 MCG SUBL Place 2,500 mcg under the tongue daily.    Marland Kitchen ENSURE (ENSURE) Take 1 Can by mouth.    . erlotinib (TARCEVA) 100 MG tablet Take 1 tablet (100 mg total) by mouth daily. Take on an empty stomach 1 hour before meals or 2 hours after 30 tablet 1  . ferrous sulfate 325 (65 FE) MG EC tablet Take 325 mg by mouth daily with breakfast.    . folic acid (FOLVITE) 427 MCG tablet Take 1 tablet (800 mcg total) by mouth daily. 30 tablet 1  . loratadine (CLARITIN) 10 MG tablet Take 1 tablet (10 mg total) by mouth daily. 30 tablet 0  . Nutritional Supplements (JUICE PLUS FIBRE PO) Take 1 capsule by mouth daily. Reported on 07/28/2015    . TURMERIC PO Take 1 capsule by mouth 2 (two) times daily. Turmeric Superior 750 mg po bid     Current Facility-Administered Medications  Medication Dose Route Frequency Provider Last Rate Last Dose  . 0.9 %  sodium chloride infusion  500 mL Intravenous Continuous Mauri Pole, MD        SURGICAL HISTORY:  Past Surgical History:  Procedure Laterality Date  . IR GENERIC HISTORICAL  07/14/2015   IR RADIOLOGIST EVAL &  MGMT 07/14/2015 Greggory Keen, MD [facility] INTERV RAD  . VIDEO BRONCHOSCOPY Bilateral 08/09/2014   Procedure: VIDEO BRONCHOSCOPY WITHOUT FLUORO;  Surgeon: [name], MD;  Location[facility] ENDOSCOPY;  Service: Cardiopulmonary;  Laterality: Bilateral;  . VIDEO BRONCHOSCOPY Bilateral 07/06/2015   Procedure: VIDEO BRONCHOSCOPY WITHOUT FLUORO;  Surgeon: [name], MD;  Location: [facility] ENDOSCOPY;  Service: Cardiopulmonary;  Laterality: Bilateral;    REVIEW OF SYSTEMS:  Constitutional: negative Eyes: negative Ears, nose, mouth, throat, and face: negative Respiratory: positive for  cough Cardiovascular: negative Gastrointestinal: negative Genitourinary:negative Integument/breast: negative Hematologic/lymphatic: negative Musculoskeletal:negative Neurological: negative Behavioral/Psych: negative Endocrine: negative Allergic/Immunologic: negative   PHYSICAL EXAMINATION: General appearance: alert, cooperative, fatigued and no distress Head: Normocephalic, without obvious abnormality, atraumatic Neck: no adenopathy, no JVD, supple, symmetrical, trachea midline and thyroid not enlarged, symmetric, no tenderness/mass/nodules Lymph nodes: Cervical, supraclavicular, and axillary nodes normal. Resp: clear to auscultation bilaterally Back: symmetric, no curvature. ROM normal. No CVA tenderness. Cardio: regular rate and rhythm, S1, S2 normal, no murmur, click, rub or gallop GI: soft, non-tender; bowel sounds normal; no masses,  no organomegaly Extremities: extremities normal, atraumatic, no cyanosis or edema Neurologic: Alert and oriented X 3, normal strength and tone. Normal symmetric reflexes. Normal coordination and gait Skin exam showed maculopapular grade 1 skin rash on the face and upper chest.  ECOG PERFORMANCE STATUS: 1 - Symptomatic but completely ambulatory  Blood pressure (!) 96/57, pulse (!) 101, temperature 97.7 F (36.5 C), temperature source Oral, resp. rate 18, height '4\' 11"'$  (1.499 m), weight 108 lb 4.8 oz (49.1 kg), SpO2 100 %.  LABORATORY DATA: Lab Results  Component Value Date   WBC 5.5 11/22/2015   HGB 15.3 11/22/2015   HCT 45.1 11/22/2015   MCV 94.2 11/22/2015   PLT 377 11/22/2015      Chemistry      Component Value Date/Time   NA 141 11/22/2015 0802   K 4.3 11/22/2015 0802   CL 109 07/19/2015 0509   CO2 26 11/22/2015 0802   BUN 8.7 11/22/2015 0802   CREATININE 0.7 11/22/2015 0802      Component Value Date/Time   CALCIUM 9.1 11/22/2015 0802   ALKPHOS 59 11/22/2015 0802   AST 20 11/22/2015 0802   ALT 19 11/22/2015 0802   BILITOT 0.71  11/22/2015 0802       RADIOGRAPHIC STUDIES: Ct Chest W Contrast  Result Date: 11/22/2015 CLINICAL DATA:  Non-small cell lung cancer EXAM: CT CHEST, ABDOMEN, AND PELVIS WITH CONTRAST TECHNIQUE: Multidetector CT imaging of the chest, abdomen and pelvis was performed following the standard protocol during bolus administration of intravenous contrast. CONTRAST:  167m ISOVUE-300 IOPAMIDOL (ISOVUE-300) INJECTION 61% COMPARISON:  08/23/2014 FINDINGS: CT CHEST FINDINGS Cardiovascular: The heart size is normal.  No pericardial effusion. Mediastinum/Nodes: The trachea appears patent and is midline. Normal appearance of the esophagus. No mediastinal or hilar adenopathy. Lungs/Pleura: There is no pleural fluid identified. Paramediastinal radiation change within the right lung is again identified. Progressive area of soft tissue consolidation within the right midlung in the area of radiation changes identified. This measures 2.2 x 3.7 cm, image 26 of series 2. Previously this measured 1.3 x 2.5 cm. The left lung is clear. Musculoskeletal: Multifocal areas of sclerotic bone metastases are again identified. The appearance is stable when compared with the previous exam. CT ABDOMEN PELVIS FINDINGS Hepatobiliary: No focal liver abnormality is seen. The gallbladder appears normal. Mild intrahepatic biliary prominence noted. The common bile duct measures 9 mm in diameter, unchanged from previous exam.  Pancreas: Unremarkable. No pancreatic ductal dilatation or surrounding inflammatory changes. Spleen: Normal in size without focal abnormality. Adrenals/Urinary Tract: The right adrenal gland appears normal. Left adrenal nodule measures 0.8 x 1.3 cm and is unchanged when compared with the previous exam. Normal appearance of the kidneys. No mass or hydronephrosis. The urinary bladder is normal. Stomach/Bowel: Stomach is within normal limits. Appendix appears normal. No evidence of bowel wall thickening, distention, or inflammatory  changes. Vascular/Lymphatic: No significant vascular findings are present. No enlarged abdominal or pelvic lymph nodes. Reproductive: Uterus and bilateral adnexa are unremarkable. Other: No abdominal wall hernia or abnormality. No abdominopelvic ascites. Musculoskeletal: Multifocal areas of sclerotic bone metastases are again noted and appears similar to the previous examination. IMPRESSION: 1. Within the area of radiation change within the right midlung there is a focal area of soft tissue attenuation which is increased from previous exam. Although findings may represent continued evolutionary changes of radiation recurrence of tumor is not excluded. Consider further investigation with PET-CT. 2. Stable appearance of left adrenal nodule. 3. Similar appearance of multifocal sclerotic bone metastases. Electronically Signed   By: Kerby Moors M.D.   On: 11/22/2015 13:44   Mr Jeri Cos VQ Contrast  Result Date: 11/11/2015 CLINICAL DATA:  45 y/o F; metastatic lung cancer status post radiation to brain metastasis. SRS protocol. EXAM: MRI HEAD WITHOUT AND WITH CONTRAST TECHNIQUE: Multiplanar, multiecho pulse sequences of the brain and surrounding structures were obtained without and with intravenous contrast. CONTRAST:  45m MULTIHANCE GADOBENATE DIMEGLUMINE 529 MG/ML IV SOLN COMPARISON:  08/11/2015 MRI of the head FINDINGS: Brain: Left posterior frontal white matter T2 hyperintense focus has decreased in size with new small focus of cystic change compatible with chronic infarct. There are several hemorrhagic lesions throughout the supratentorial brain and small focus in the left medial cerebellar hemisphere that are stable or decreased in size in comparison with prior MRI of the brain . No new focus of abnormal enhancement, diffusion restriction, T2 FLAIR signal abnormality, or susceptibility hypointensity is identified. Vascular: Normal flow voids. Skull and upper cervical spine: There are several subcentimeter T1  hyperintense foci in bone marrow which may represent focal fat or hemangioma. No enhancing metastatic lesion identified. Sinuses/Orbits: Negative. Other: None. IMPRESSION: 1. Multiple stable hemorrhagic metastasis. No new metastatic deposit identified. 2. Decreased focus of T2 FLAIR signal abnormality in the left posterior frontal white matter without enhancement and compatible with small chronic infarct. 3. Otherwise unremarkable MRI of the brain. Electronically Signed   By: LKristine GarbeM.D.   On: 11/11/2015 13:55   Ct Abdomen Pelvis W Contrast  Result Date: 11/22/2015 CLINICAL DATA:  Non-small cell lung cancer EXAM: CT CHEST, ABDOMEN, AND PELVIS WITH CONTRAST TECHNIQUE: Multidetector CT imaging of the chest, abdomen and pelvis was performed following the standard protocol during bolus administration of intravenous contrast. CONTRAST:  1040mISOVUE-300 IOPAMIDOL (ISOVUE-300) INJECTION 61% COMPARISON:  08/23/2014 FINDINGS: CT CHEST FINDINGS Cardiovascular: The heart size is normal.  No pericardial effusion. Mediastinum/Nodes: The trachea appears patent and is midline. Normal appearance of the esophagus. No mediastinal or hilar adenopathy. Lungs/Pleura: There is no pleural fluid identified. Paramediastinal radiation change within the right lung is again identified. Progressive area of soft tissue consolidation within the right midlung in the area of radiation changes identified. This measures 2.2 x 3.7 cm, image 26 of series 2. Previously this measured 1.3 x 2.5 cm. The left lung is clear. Musculoskeletal: Multifocal areas of sclerotic bone metastases are again identified. The appearance is stable when  compared with the previous exam. CT ABDOMEN PELVIS FINDINGS Hepatobiliary: No focal liver abnormality is seen. The gallbladder appears normal. Mild intrahepatic biliary prominence noted. The common bile duct measures 9 mm in diameter, unchanged from previous exam. Pancreas: Unremarkable. No pancreatic  ductal dilatation or surrounding inflammatory changes. Spleen: Normal in size without focal abnormality. Adrenals/Urinary Tract: The right adrenal gland appears normal. Left adrenal nodule measures 0.8 x 1.3 cm and is unchanged when compared with the previous exam. Normal appearance of the kidneys. No mass or hydronephrosis. The urinary bladder is normal. Stomach/Bowel: Stomach is within normal limits. Appendix appears normal. No evidence of bowel wall thickening, distention, or inflammatory changes. Vascular/Lymphatic: No significant vascular findings are present. No enlarged abdominal or pelvic lymph nodes. Reproductive: Uterus and bilateral adnexa are unremarkable. Other: No abdominal wall hernia or abnormality. No abdominopelvic ascites. Musculoskeletal: Multifocal areas of sclerotic bone metastases are again noted and appears similar to the previous examination. IMPRESSION: 1. Within the area of radiation change within the right midlung there is a focal area of soft tissue attenuation which is increased from previous exam. Although findings may represent continued evolutionary changes of radiation recurrence of tumor is not excluded. Consider further investigation with PET-CT. 2. Stable appearance of left adrenal nodule. 3. Similar appearance of multifocal sclerotic bone metastases. Electronically Signed   By: Kerby Moors M.D.   On: 11/22/2015 13:44    ASSESSMENT AND PLAN: This is a very pleasant 45 years old Asian female never smoker with recently diagnosed with stage IV non-small cell lung cancer, adenocarcinoma presented with right middle lobe lung mass in addition to right hilar adenopathy and metastatic disease to the bones, brain and left adrenal gland.  She status post whole brain irradiation with some improvement in her disease on the recent MRI of the brain. The patient completed treatment with Tarceva 150 mg by mouth daily for 5 months and tolerating it fairly well except for grade 2 skin  rashAnd diarrhea. She is currently on Tarceva 100 mg by mouth daily started 02/19/2015. She is tolerating the lower dose of Tarceva much better. She status post 9 months. The recent CT scan of the chest, abdomen and pelvis showed no evidence for disease progression. I discussed the scan results with the patient today. I recommended for her to continue her current treatment with Tarceva with the same dose. For the metastatic bone lesions, she started treatment with Delton See and she is tolerating it well.  She will come back for follow-up visit in one month for reevaluation with repeat blood work. She was advised to call immediately if she has any concerning symptoms in the interval. The patient voices understanding of current disease status and treatment options and is in agreement with the current care plan.  All questions were answered. The patient knows to call the clinic with any problems, questions or concerns. We can certainly see the patient much sooner if necessary.  Disclaimer: This note was dictated with voice recognition software. Similar sounding words can inadvertently be transcribed and may not be corrected upon review.

## 2015-11-29 NOTE — Telephone Encounter (Signed)
Gave patient avs report and appointments for December  °

## 2015-11-29 NOTE — Telephone Encounter (Signed)
Injections and Labs scheduled q 4 weeks, for six months, per Dr. Julien Nordmann (verbal) 11/29/15  Appointments scheduled per 11/29/15 los.  Copy of  AVS report and appointment schedule was given to patient, per 11/29/15 los.

## 2015-12-01 ENCOUNTER — Other Ambulatory Visit: Payer: Self-pay | Admitting: Radiation Therapy

## 2015-12-01 DIAGNOSIS — C7931 Secondary malignant neoplasm of brain: Secondary | ICD-10-CM

## 2015-12-01 DIAGNOSIS — C7949 Secondary malignant neoplasm of other parts of nervous system: Principal | ICD-10-CM

## 2015-12-06 ENCOUNTER — Encounter: Payer: Self-pay | Admitting: Gastroenterology

## 2015-12-06 ENCOUNTER — Ambulatory Visit (INDEPENDENT_AMBULATORY_CARE_PROVIDER_SITE_OTHER): Payer: Medicaid Other | Admitting: Gastroenterology

## 2015-12-06 VITALS — BP 88/60 | HR 112 | Ht 59.5 in | Wt 109.1 lb

## 2015-12-06 DIAGNOSIS — K2100 Gastro-esophageal reflux disease with esophagitis, without bleeding: Secondary | ICD-10-CM

## 2015-12-06 MED ORDER — RANITIDINE HCL 300 MG PO TABS: 300.0000 mg | ORAL_TABLET | Freq: Every day | ORAL | 6 refills | Status: DC

## 2015-12-06 MED ORDER — PANTOPRAZOLE SODIUM 40 MG PO TBEC: 40.0000 mg | DELAYED_RELEASE_TABLET | Freq: Every day | ORAL | 6 refills | Status: DC

## 2015-12-06 MED FILL — raNITIdine HCL 300 MG TABS: 300 | 30 days supply | Qty: 30 | Fill #0

## 2015-12-06 MED FILL — PANTOPRAZOLE SOD DR 40 MG T: 40 | 30 days supply | Qty: 30 | Fill #0

## 2015-12-06 NOTE — Patient Instructions (Signed)
We will send Protonix and Zantac to your local pharmacy  Follow up in 3 months

## 2015-12-06 NOTE — Progress Notes (Signed)
Deborah Foster    [id number]    [birth year]  Primary Care Physician:ELKINS,WILSON [name], MD  Referring Physician: [name], MD [address] [place], [place] [address]  Chief complaint: GERD HPI:  45 year old female with stage IV non-small cell lung cancer, adenocarcinoma R middle lobe, metastatic disease to bone, brain and left adrenall diagnosed in July 2016, status post palliative radiotherapy and is status post chemotherapy with Tarceva here for for follow-up visit . She was last seen in office in September 2017. EGD showed LA grade B erosive esophagitis and hiatal hernia. She complained of heartburn on daily basis with regurgitation of fluid and acid taste in the mouth. She has difficulty swallowing mostly with large pills, no issue most the time with solids or liquids but occasionally does choke if she tries to drink fast or eats a large piece of meat.  She completed Tarceva. Denies any nausea, vomiting, abdominal pain, melena, bright red blood per rectum, constipation or diarrhea. Most recent imaging shows stable disease with no new mets   Outpatient Encounter Prescriptions as of 12/06/2015  Medication Sig  . benzonatate (TESSALON) 100 MG capsule Take 1 capsule (100 mg total) by mouth every 8 (eight) hours as needed.  . chlorpheniramine-HYDROcodone (TUSSIONEX) 10-8 MG/5ML SUER Take 5 mLs by mouth every 12 (twelve) hours as needed.  . clindamycin (CLINDAGEL) 1 % gel Apply 1 application topically 2 (two) times daily.  . Cyanocobalamin (VITAMIN B-12) 2500 MCG SUBL Place 2,500 mcg under the tongue daily.  Marland Kitchen ENSURE (ENSURE) Take 1 Can by mouth.  . erlotinib (TARCEVA) 100 MG tablet Take 1 tablet (100 mg total) by mouth daily. Take on an empty stomach 1 hour before meals or 2 hours after  . ferrous sulfate 325 (65 FE) MG EC tablet Take 325 mg by mouth daily with breakfast.  . folic acid (FOLVITE) 254 MCG tablet Take 1 tablet (800 mcg total) by mouth daily.  Marland Kitchen  loratadine (CLARITIN) 10 MG tablet Take 1 tablet (10 mg total) by mouth daily.  . Nutritional Supplements (JUICE PLUS FIBRE PO) Take 1 capsule by mouth daily. Reported on 07/28/2015  . TURMERIC PO Take 1 capsule by mouth 2 (two) times daily. Turmeric Superior 750 mg po bid   Facility-Administered Encounter Medications as of 12/06/2015  Medication  . 0.9 %  sodium chloride infusion    Allergies as of 12/06/2015 - Review Complete 12/06/2015  Allergen Reaction Noted  . Other Other (See Comments) 04/30/2015  . Doxycycline Nausea And Vomiting 12/07/2014    Past Medical History:  Diagnosis Date  . Bone metastasis ([place])   . Hemoptysis   . Hypokalemia   . lung ca dx'd 07/2014  . Lung mass   . Metastasis to adrenal gland ([place])   . Metastasis to brain ([place])   . Pneumonia   . Radiation 08/23/14-09/07/14   Brain/chest and left hip 30 Gy 12 Fx  . URI (upper respiratory infection) 02-02-2015    Past Surgical History:  Procedure Laterality Date  . IR GENERIC HISTORICAL  07/14/2015   IR RADIOLOGIST EVAL & MGMT 07/14/2015 Greggory Keen, MD [facility] INTERV RAD  . VIDEO BRONCHOSCOPY Bilateral 08/09/2014   Procedure: VIDEO BRONCHOSCOPY WITHOUT FLUORO;  Surgeon: [name], MD;  Location[facility] ENDOSCOPY;  Service: Cardiopulmonary;  Laterality: Bilateral;  . VIDEO BRONCHOSCOPY Bilateral 07/06/2015   Procedure: VIDEO BRONCHOSCOPY WITHOUT FLUORO;  Surgeon: [name], MD;  Location: [facility] ENDOSCOPY;  Service: Cardiopulmonary;  Laterality: Bilateral;    Family History  Problem Relation Age of Onset  . Hyperlipidemia Mother     Social History   Social History  . Marital status: Married    Spouse name: N/A  . Number of children: 3  . Years of education: N/A   Occupational History  . unemployed    Social History Main Topics  . Smoking status: Never Smoker  . Smokeless tobacco: Never Used  . Alcohol use No  . Drug use: No  . Sexual activity: Not Currently   Other Topics Concern  .  Not on file   Social History Narrative   Patient moved to [place] in [place].   Had been in [place] since that time.      Review of systems: Review of Systems  Constitutional: Negative for fever and chills.  HENT: Negative.   Eyes: Negative for blurred vision.  Respiratory: Positive for cough and wheezing.   negative for shortness of breath Cardiovascular: Negative for chest pain and palpitations.  Gastrointestinal: as per HPI Genitourinary: Negative for dysuria, urgency, frequency and hematuria.  Musculoskeletal: Positive for myalgias, back pain and joint pain.  Skin: Negative for itching and rash.  Neurological: Negative for dizziness, tremors, focal weakness, seizures and loss of consciousness.  positive for headache Endo/Heme/Allergies: Positive for seasonal allergies.  Psychiatric/Behavioral: Negative for depression, suicidal ideas and hallucinations.  All other systems reviewed and are negative.   Physical Exam: Vitals:   12/06/15 1026  BP: (!) 88/60  Pulse: (!) 112   Body mass index is 21.67 kg/m. Gen:      No acute distress HEENT:  EOMI, sclera anicteric Neck:     No masses; no thyromegaly Lungs:    Clear to auscultation bilaterally; normal respiratory effort CV:         Regular rate and rhythm; no murmurs Abd:      + bowel sounds; soft, non-tender; no palpable masses, no distension Ext:    No edema; adequate peripheral perfusion Skin:      Warm and dry; no rash Neuro: alert and oriented x 3 Psych: normal mood and affect  Data Reviewed:  Reviewed labs, radiology imaging, old records and pertinent past GI work up   Assessment and Plan/Recommendations:  45 year old female with stage IV metastatic non-small cell lung cancer status post whole brain irradiation, s/p chemotherapy Tarceva here for follow up of heartburn and dyspepsia Stable disease with no additional metastases based on recent CT chest abdomen and pelvis  Given she is currently off  Tarceva, will start Protonix '40mg'$  daily, 30 min before breakfast and Zantac '300mg'$  at bedtime as needed Anti reflux measures Return in 3 months or sooner if needed Discussed Flu shot, patient wanted to talk to Oncologist before getting it. 15 minutes was spent face-to-face with the patient. Greater than 50% of the time used for counseling as well as treatment plan and follow-up. She had multiple questions which were answered to her satisfaction  K. Denzil Magnuson , MD [phone] Mon-Fri 8a-5p [phone] after 5p, weekends, holidays  CC: [name], *

## 2015-12-20 ENCOUNTER — Ambulatory Visit (HOSPITAL_BASED_OUTPATIENT_CLINIC_OR_DEPARTMENT_OTHER): Payer: Medicaid Other

## 2015-12-20 ENCOUNTER — Other Ambulatory Visit (HOSPITAL_BASED_OUTPATIENT_CLINIC_OR_DEPARTMENT_OTHER): Payer: Medicaid Other

## 2015-12-20 VITALS — BP 101/55 | HR 100 | Temp 98.3°F | Resp 20

## 2015-12-20 DIAGNOSIS — C7951 Secondary malignant neoplasm of bone: Secondary | ICD-10-CM

## 2015-12-20 DIAGNOSIS — C3491 Malignant neoplasm of unspecified part of right bronchus or lung: Secondary | ICD-10-CM

## 2015-12-20 DIAGNOSIS — C342 Malignant neoplasm of middle lobe, bronchus or lung: Secondary | ICD-10-CM

## 2015-12-20 DIAGNOSIS — Z5111 Encounter for antineoplastic chemotherapy: Secondary | ICD-10-CM

## 2015-12-20 LAB — COMPREHENSIVE METABOLIC PANEL
ALBUMIN: 3.4 g/dL — AB (ref 3.5–5.0)
ALK PHOS: 56 U/L (ref 40–150)
ALT: 18 U/L (ref 0–55)
AST: 20 U/L (ref 5–34)
Anion Gap: 9 mEq/L (ref 3–11)
BILIRUBIN TOTAL: 0.89 mg/dL (ref 0.20–1.20)
BUN: 9.2 mg/dL (ref 7.0–26.0)
CO2: 25 meq/L (ref 22–29)
Calcium: 8.9 mg/dL (ref 8.4–10.4)
Chloride: 104 mEq/L (ref 98–109)
Creatinine: 0.6 mg/dL (ref 0.6–1.1)
EGFR: >90 mL/min/{1.73_m2} (ref >90)
GLUCOSE: 83 mg/dL (ref 70–140)
POTASSIUM: 3.7 meq/L (ref 3.5–5.1)
SODIUM: 137 meq/L (ref 136–145)
TOTAL PROTEIN: 7.1 g/dL (ref 6.4–8.3)

## 2015-12-20 LAB — CBC WITH DIFFERENTIAL/PLATELET
BASO%: 0.7 % (ref 0.0–2.0)
Basophils Absolute: 0.1 10*3/uL (ref 0.0–0.1)
EOS%: 6.8 % (ref 0.0–7.0)
Eosinophils Absolute: 0.6 10*3/uL — ABNORMAL HIGH (ref 0.0–0.5)
HCT: 44.1 % (ref 34.8–46.6)
HEMOGLOBIN: 15 g/dL (ref 11.6–15.9)
LYMPH%: 12.3 % — ABNORMAL LOW (ref 14.0–49.7)
MCH: 32 pg (ref 25.1–34.0)
MCHC: 33.9 g/dL (ref 31.5–36.0)
MCV: 94.2 fL (ref 79.5–101.0)
MONO#: 0.7 10*3/uL (ref 0.1–0.9)
MONO%: 8 % (ref 0.0–14.0)
NEUT%: 72.2 % (ref 38.4–76.8)
NEUTROS ABS: 6 10*3/uL (ref 1.5–6.5)
Platelets: 374 10*3/uL (ref 145–400)
RBC: 4.68 10*6/uL (ref 3.70–5.45)
RDW: 12.7 % (ref 11.2–14.5)
WBC: 8.3 10*3/uL (ref 3.9–10.3)
lymph#: 1 10*3/uL (ref 0.9–3.3)

## 2015-12-20 MED ORDER — DENOSUMAB 120 MG/1.7ML ~~LOC~~ SOLN
120.0000 mg | Freq: Once | SUBCUTANEOUS | Status: AC
  Administered 2015-12-20: 120 mg via SUBCUTANEOUS
  Filled 2015-12-20: qty 1.7

## 2015-12-20 NOTE — Patient Instructions (Signed)
Denosumab injection  What is this medicine?  DENOSUMAB (den oh sue mab) slows bone breakdown. Prolia is used to treat osteoporosis in women after menopause and in men. Xgeva is used to prevent bone fractures and other bone problems caused by cancer bone metastases. Xgeva is also used to treat giant cell tumor of the bone.  This medicine may be used for other purposes; ask your health care provider or pharmacist if you have questions.  What should I tell my health care provider before I take this medicine?  They need to know if you have any of these conditions:  -dental disease  -eczema  -infection or history of infections  -kidney disease or on dialysis  -low blood calcium or vitamin D  -malabsorption syndrome  -scheduled to have surgery or tooth extraction  -taking medicine that contains denosumab  -thyroid or parathyroid disease  -an unusual reaction to denosumab, other medicines, foods, dyes, or preservatives  -pregnant or trying to get pregnant  -breast-feeding  How should I use this medicine?  This medicine is for injection under the skin. It is given by a health care professional in a hospital or clinic setting.  If you are getting Prolia, a special MedGuide will be given to you by the pharmacist with each prescription and refill. Be sure to read this information carefully each time.  For Prolia, talk to your pediatrician regarding the use of this medicine in children. Special care may be needed. For Xgeva, talk to your pediatrician regarding the use of this medicine in children. While this drug may be prescribed for children as young as 13 years for selected conditions, precautions do apply.  Overdosage: If you think you have taken too much of this medicine contact a poison control center or emergency room at once.  NOTE: This medicine is only for you. Do not share this medicine with others.  What if I miss a dose?  It is important not to miss your dose. Call your doctor or health care professional if you are  unable to keep an appointment.  What may interact with this medicine?  Do not take this medicine with any of the following medications:  -other medicines containing denosumab  This medicine may also interact with the following medications:  -medicines that suppress the immune system  -medicines that treat cancer  -steroid medicines like prednisone or cortisone  This list may not describe all possible interactions. Give your health care provider a list of all the medicines, herbs, non-prescription drugs, or dietary supplements you use. Also tell them if you smoke, drink alcohol, or use illegal drugs. Some items may interact with your medicine.  What should I watch for while using this medicine?  Visit your doctor or health care professional for regular checks on your progress. Your doctor or health care professional may order blood tests and other tests to see how you are doing.  Call your doctor or health care professional if you get a cold or other infection while receiving this medicine. Do not treat yourself. This medicine may decrease your body's ability to fight infection.  You should make sure you get enough calcium and vitamin D while you are taking this medicine, unless your doctor tells you not to. Discuss the foods you eat and the vitamins you take with your health care professional.  See your dentist regularly. Brush and floss your teeth as directed. Before you have any dental work done, tell your dentist you are receiving this medicine.  Do   not become pregnant while taking this medicine or for 5 months after stopping it. Women should inform their doctor if they wish to become pregnant or think they might be pregnant. There is a potential for serious side effects to an unborn child. Talk to your health care professional or pharmacist for more information.  What side effects may I notice from receiving this medicine?  Side effects that you should report to your doctor or health care professional as soon as  possible:  -allergic reactions like skin rash, itching or hives, swelling of the face, lips, or tongue  -breathing problems  -chest pain  -fast, irregular heartbeat  -feeling faint or lightheaded, falls  -fever, chills, or any other sign of infection  -muscle spasms, tightening, or twitches  -numbness or tingling  -skin blisters or bumps, or is dry, peels, or red  -slow healing or unexplained pain in the mouth or jaw  -unusual bleeding or bruising  Side effects that usually do not require medical attention (Report these to your doctor or health care professional if they continue or are bothersome.):  -muscle pain  -stomach upset, gas  This list may not describe all possible side effects. Call your doctor for medical advice about side effects. You may report side effects to FDA at 1-800-FDA-1088.  Where should I keep my medicine?  This medicine is only given in a clinic, doctor's office, or other health care setting and will not be stored at home.  NOTE: This sheet is a summary. It may not cover all possible information. If you have questions about this medicine, talk to your doctor, pharmacist, or health care provider.      2016, Elsevier/Gold Standard. (2011-07-02 12:37:47)

## 2015-12-26 ENCOUNTER — Encounter: Payer: Self-pay | Admitting: Internal Medicine

## 2015-12-26 ENCOUNTER — Telehealth: Payer: Self-pay | Admitting: Internal Medicine

## 2015-12-26 ENCOUNTER — Other Ambulatory Visit: Payer: Self-pay

## 2015-12-26 ENCOUNTER — Ambulatory Visit (HOSPITAL_BASED_OUTPATIENT_CLINIC_OR_DEPARTMENT_OTHER): Payer: Medicaid Other | Admitting: Internal Medicine

## 2015-12-26 VITALS — BP 99/61 | HR 104 | Temp 98.0°F | Resp 18 | Ht 59.5 in | Wt 109.0 lb

## 2015-12-26 DIAGNOSIS — C342 Malignant neoplasm of middle lobe, bronchus or lung: Secondary | ICD-10-CM

## 2015-12-26 DIAGNOSIS — C7931 Secondary malignant neoplasm of brain: Secondary | ICD-10-CM

## 2015-12-26 DIAGNOSIS — C3491 Malignant neoplasm of unspecified part of right bronchus or lung: Secondary | ICD-10-CM

## 2015-12-26 DIAGNOSIS — R05 Cough: Secondary | ICD-10-CM | POA: Diagnosis not present

## 2015-12-26 DIAGNOSIS — C7951 Secondary malignant neoplasm of bone: Secondary | ICD-10-CM | POA: Diagnosis not present

## 2015-12-26 DIAGNOSIS — Z5111 Encounter for antineoplastic chemotherapy: Secondary | ICD-10-CM

## 2015-12-26 NOTE — Telephone Encounter (Signed)
Appointments scheduled per 12/26/15 los. A copy of the appointment schedule was given to the patient, per 12/26/15 los.

## 2015-12-26 NOTE — Progress Notes (Signed)
[place] [phone]) [phone]   [phone]  OFFICE PROGRESS NOTE  [name], MD [place] [address]  DIAGNOSIS: Stage IV (T2a, N1, M1 B) non-small cell lung cancer, adenocarcinoma presented with right middle lobe lung mass, right hilar adenopathy as well as metastatic disease to the bone, brain and left adrenal diagnosed in July 2016.  PRIOR THERAPY: 1) palliative radiotherapy to the brain as well as metastatic bone lesions in the pelvis. 2) Tarceva 150 mg by mouth daily started on 09/25/2014. Status post 5 months of treatment. 3) status post palliative radiotherapy to the right middle lobe lung mass for treatment of hemoptysis.  CURRENT THERAPY: 1) Tarceva 100 mg by mouth daily started 02/19/2015 status post 10 months of treatment. 2) Xgeva 120 g subcutaneously on monthly basis.  INTERVAL HISTORY: Deborah Foster 45 y.o. female returns to the clinic today for follow-up visit accompanied by her interpreter. The patient is feeling well today except for persistent cough. She is currently on Dylsem, Tessalon and Tussionex for cough. She was also seen recently by gastroenterology and has upper endoscopy done that showed hiatal hernia. He started her on treatment with Zantac and Protonix. The patient denied having any significant weight loss or night sweats. She has no nausea, vomiting, diarrhea or constipation. She is here today for reevaluation with repeat blood work.  MEDICAL HISTORY: Past Medical History:  Diagnosis Date  . Bone metastasis ([place])   . Hemoptysis   . Hypokalemia   . lung ca dx'd 07/2014  . Lung mass   . Metastasis to adrenal gland ([place])   . Metastasis to brain ([place])   . Pneumonia   . Radiation 08/23/14-09/07/14   Brain/chest and left hip 30 Gy 12 Fx  . URI (upper respiratory infection) 02-02-2015    ALLERGIES:  is allergic to other and doxycycline.  MEDICATIONS:  Current Outpatient Prescriptions    Medication Sig Dispense Refill  . benzonatate (TESSALON) 100 MG capsule Take 1 capsule (100 mg total) by mouth every 8 (eight) hours as needed. 20 capsule 0  . chlorpheniramine-HYDROcodone (TUSSIONEX) 10-8 MG/5ML SUER Take 5 mLs by mouth every 12 (twelve) hours as needed.  0  . clindamycin (CLINDAGEL) 1 % gel Apply 1 application topically 2 (two) times daily.    . Cyanocobalamin (VITAMIN B-12) 2500 MCG SUBL Place 2,500 mcg under the tongue daily.    Marland Kitchen ENSURE (ENSURE) Take 1 Can by mouth.    . erlotinib (TARCEVA) 100 MG tablet Take 1 tablet (100 mg total) by mouth daily. Take on an empty stomach 1 hour before meals or 2 hours after 30 tablet 1  . ferrous sulfate 325 (65 FE) MG EC tablet Take 325 mg by mouth daily with breakfast.    . folic acid (FOLVITE) 093 MCG tablet Take 1 tablet (800 mcg total) by mouth daily. 30 tablet 1  . loratadine (CLARITIN) 10 MG tablet Take 1 tablet (10 mg total) by mouth daily. 30 tablet 0  . Nutritional Supplements (JUICE PLUS FIBRE PO) Take 1 capsule by mouth daily. Reported on 07/28/2015    . pantoprazole (PROTONIX) 40 MG tablet Take 1 tablet (40 mg total) by mouth daily. 30 minutes before breakfast 30 tablet 6  . ranitidine (ZANTAC) 300 MG tablet Take 1 tablet (300 mg total) by mouth at bedtime. 30 tablet 6  . TURMERIC PO Take 1 capsule by mouth 2 (two) times daily. Turmeric Superior 750 mg po bid  Current Facility-Administered Medications  Medication Dose Route Frequency Provider Last Rate Last Dose  . 0.9 %  sodium chloride infusion  500 mL Intravenous Continuous Mauri Pole, MD        SURGICAL HISTORY:  Past Surgical History:  Procedure Laterality Date  . IR GENERIC HISTORICAL  07/14/2015   IR RADIOLOGIST EVAL & MGMT 07/14/2015 Greggory Keen, MD [facility] INTERV RAD  . VIDEO BRONCHOSCOPY Bilateral 08/09/2014   Procedure: VIDEO BRONCHOSCOPY WITHOUT FLUORO;  Surgeon: [name], MD;  Location[facility] ENDOSCOPY;  Service: Cardiopulmonary;  Laterality:  Bilateral;  . VIDEO BRONCHOSCOPY Bilateral 07/06/2015   Procedure: VIDEO BRONCHOSCOPY WITHOUT FLUORO;  Surgeon: [name], MD;  Location: [facility] ENDOSCOPY;  Service: Cardiopulmonary;  Laterality: Bilateral;    REVIEW OF SYSTEMS:  A comprehensive review of systems was negative except for: Respiratory: positive for cough   PHYSICAL EXAMINATION: General appearance: alert, cooperative, fatigued and no distress Head: Normocephalic, without obvious abnormality, atraumatic Neck: no adenopathy, no JVD, supple, symmetrical, trachea midline and thyroid not enlarged, symmetric, no tenderness/mass/nodules Lymph nodes: Cervical, supraclavicular, and axillary nodes normal. Resp: clear to auscultation bilaterally Back: symmetric, no curvature. ROM normal. No CVA tenderness. Cardio: regular rate and rhythm, S1, S2 normal, no murmur, click, rub or gallop GI: soft, non-tender; bowel sounds normal; no masses,  no organomegaly Extremities: extremities normal, atraumatic, no cyanosis or edema  ECOG PERFORMANCE STATUS: 1 - Symptomatic but completely ambulatory  Blood pressure 99/61, pulse (!) 104, temperature 98 F (36.7 C), temperature source Oral, resp. rate 18, height 4' 11.5" (1.511 m), weight 109 lb (49.4 kg), SpO2 99 %.  LABORATORY DATA: Lab Results  Component Value Date   WBC 8.3 12/20/2015   HGB 15.0 12/20/2015   HCT 44.1 12/20/2015   MCV 94.2 12/20/2015   PLT 374 12/20/2015      Chemistry      Component Value Date/Time   NA 137 12/20/2015 0808   K 3.7 12/20/2015 0808   CL 109 07/19/2015 0509   CO2 25 12/20/2015 0808   BUN 9.2 12/20/2015 0808   CREATININE 0.6 12/20/2015 0808      Component Value Date/Time   CALCIUM 8.9 12/20/2015 0808   ALKPHOS 56 12/20/2015 0808   AST 20 12/20/2015 0808   ALT 18 12/20/2015 0808   BILITOT 0.89 12/20/2015 0808       RADIOGRAPHIC STUDIES: No results found.  ASSESSMENT AND PLAN: This is a very pleasant 45 years old Asian female with stage IV  non-small cell lung cancer and currently undergoing treatment with Tarceva 100 mg by mouth daily. She is tolerating her treatment well. Because of the hiatal hernia she was started recently on Protonix and Zantac. I recommended for the patient to discontinue her treatment with Protonix because of the potential decreased absorption of Tarceva with Protonix. I recommended for her to continue on Zantac twice a day including first dose 2 hours after Tarceva and the other dose at night time. She will come back for follow-up visit in one month's for evaluation with repeat blood work. For the cough, she will continue with her current cough medication. She was advised to call immediately if she has any concerning symptoms in the interval. The patient voices understanding of current disease status and treatment options and is in agreement with the current care plan.  All questions were answered. The patient knows to call the clinic with any problems, questions or concerns. We can certainly see the patient much sooner if necessary.  I  spent 10 minutes counseling the patient face to face. The total time spent in the appointment was 15 minutes.  Disclaimer: This note was dictated with voice recognition software. Similar sounding words can inadvertently be transcribed and may not be corrected upon review.

## 2016-01-03 MED FILL — raNITIdine HCL 300 MG TABS: 300 | 30 days supply | Qty: 30 | Fill #1

## 2016-01-13 ENCOUNTER — Other Ambulatory Visit: Payer: Self-pay | Admitting: Internal Medicine

## 2016-01-17 ENCOUNTER — Other Ambulatory Visit: Payer: Self-pay | Admitting: Medical Oncology

## 2016-01-17 ENCOUNTER — Ambulatory Visit (HOSPITAL_BASED_OUTPATIENT_CLINIC_OR_DEPARTMENT_OTHER): Payer: Medicaid Other

## 2016-01-17 ENCOUNTER — Other Ambulatory Visit (HOSPITAL_BASED_OUTPATIENT_CLINIC_OR_DEPARTMENT_OTHER): Payer: Medicaid Other

## 2016-01-17 VITALS — BP 112/62 | HR 95 | Temp 98.1°F | Resp 18

## 2016-01-17 DIAGNOSIS — C342 Malignant neoplasm of middle lobe, bronchus or lung: Secondary | ICD-10-CM

## 2016-01-17 DIAGNOSIS — C3491 Malignant neoplasm of unspecified part of right bronchus or lung: Secondary | ICD-10-CM

## 2016-01-17 DIAGNOSIS — C7951 Secondary malignant neoplasm of bone: Secondary | ICD-10-CM | POA: Diagnosis present

## 2016-01-17 DIAGNOSIS — R630 Anorexia: Secondary | ICD-10-CM

## 2016-01-17 DIAGNOSIS — E889 Metabolic disorder, unspecified: Secondary | ICD-10-CM

## 2016-01-17 DIAGNOSIS — C7931 Secondary malignant neoplasm of brain: Secondary | ICD-10-CM

## 2016-01-17 DIAGNOSIS — C3431 Malignant neoplasm of lower lobe, right bronchus or lung: Secondary | ICD-10-CM

## 2016-01-17 DIAGNOSIS — Z5111 Encounter for antineoplastic chemotherapy: Secondary | ICD-10-CM

## 2016-01-17 DIAGNOSIS — M9080 Osteopathy in diseases classified elsewhere, unspecified site: Secondary | ICD-10-CM

## 2016-01-17 DIAGNOSIS — M898X9 Other specified disorders of bone, unspecified site: Secondary | ICD-10-CM

## 2016-01-17 LAB — CBC WITH DIFFERENTIAL/PLATELET
BASO%: 0.5 % (ref 0.0–2.0)
Basophils Absolute: 0 10*3/uL (ref 0.0–0.1)
EOS%: 6.1 % (ref 0.0–7.0)
Eosinophils Absolute: 0.4 10*3/uL (ref 0.0–0.5)
HEMATOCRIT: 42.1 % (ref 34.8–46.6)
HGB: 14.3 g/dL (ref 11.6–15.9)
LYMPH#: 1 10*3/uL (ref 0.9–3.3)
LYMPH%: 15.1 % (ref 14.0–49.7)
MCH: 32 pg (ref 25.1–34.0)
MCHC: 34 g/dL (ref 31.5–36.0)
MCV: 94.2 fL (ref 79.5–101.0)
MONO#: 0.6 10*3/uL (ref 0.1–0.9)
MONO%: 9.3 % (ref 0.0–14.0)
NEUT%: 69 % (ref 38.4–76.8)
NEUTROS ABS: 4.5 10*3/uL (ref 1.5–6.5)
PLATELETS: 328 10*3/uL (ref 145–400)
RBC: 4.47 10*6/uL (ref 3.70–5.45)
RDW: 12.8 % (ref 11.2–14.5)
WBC: 6.6 10*3/uL (ref 3.9–10.3)

## 2016-01-17 LAB — COMPREHENSIVE METABOLIC PANEL
ALT: 16 U/L (ref 0–55)
ANION GAP: 10 meq/L (ref 3–11)
AST: 17 U/L (ref 5–34)
Albumin: 3.5 g/dL (ref 3.5–5.0)
Alkaline Phosphatase: 51 U/L (ref 40–150)
BILIRUBIN TOTAL: 0.63 mg/dL (ref 0.20–1.20)
BUN: 10.9 mg/dL (ref 7.0–26.0)
CALCIUM: 8.9 mg/dL (ref 8.4–10.4)
CO2: 25 meq/L (ref 22–29)
Chloride: 103 mEq/L (ref 98–109)
Creatinine: 0.6 mg/dL (ref 0.6–1.1)
EGFR: >90 mL/min/{1.73_m2} (ref >90)
Glucose: 84 mg/dl (ref 70–140)
Potassium: 3.9 mEq/L (ref 3.5–5.1)
Sodium: 137 mEq/L (ref 136–145)
TOTAL PROTEIN: 6.9 g/dL (ref 6.4–8.3)

## 2016-01-17 MED ORDER — DENOSUMAB 120 MG/1.7ML ~~LOC~~ SOLN
120.0000 mg | Freq: Once | SUBCUTANEOUS | Status: AC
  Administered 2016-01-17: 120 mg via SUBCUTANEOUS
  Filled 2016-01-17: qty 1.7

## 2016-01-17 MED ORDER — ERLOTINIB HCL 100 MG PO TABS: 100.0000 mg | ORAL_TABLET | Freq: Every day | ORAL | 1 refills | Status: DC

## 2016-01-17 NOTE — Patient Instructions (Signed)
Denosumab injection  What is this medicine?  DENOSUMAB (den oh sue mab) slows bone breakdown. Prolia is used to treat osteoporosis in women after menopause and in men. Xgeva is used to prevent bone fractures and other bone problems caused by cancer bone metastases. Xgeva is also used to treat giant cell tumor of the bone.  This medicine may be used for other purposes; ask your health care provider or pharmacist if you have questions.  What should I tell my health care provider before I take this medicine?  They need to know if you have any of these conditions:  -dental disease  -eczema  -infection or history of infections  -kidney disease or on dialysis  -low blood calcium or vitamin D  -malabsorption syndrome  -scheduled to have surgery or tooth extraction  -taking medicine that contains denosumab  -thyroid or parathyroid disease  -an unusual reaction to denosumab, other medicines, foods, dyes, or preservatives  -pregnant or trying to get pregnant  -breast-feeding  How should I use this medicine?  This medicine is for injection under the skin. It is given by a health care professional in a hospital or clinic setting.  If you are getting Prolia, a special MedGuide will be given to you by the pharmacist with each prescription and refill. Be sure to read this information carefully each time.  For Prolia, talk to your pediatrician regarding the use of this medicine in children. Special care may be needed. For Xgeva, talk to your pediatrician regarding the use of this medicine in children. While this drug may be prescribed for children as young as 13 years for selected conditions, precautions do apply.  Overdosage: If you think you have taken too much of this medicine contact a poison control center or emergency room at once.  NOTE: This medicine is only for you. Do not share this medicine with others.  What if I miss a dose?  It is important not to miss your dose. Call your doctor or health care professional if you are  unable to keep an appointment.  What may interact with this medicine?  Do not take this medicine with any of the following medications:  -other medicines containing denosumab  This medicine may also interact with the following medications:  -medicines that suppress the immune system  -medicines that treat cancer  -steroid medicines like prednisone or cortisone  This list may not describe all possible interactions. Give your health care provider a list of all the medicines, herbs, non-prescription drugs, or dietary supplements you use. Also tell them if you smoke, drink alcohol, or use illegal drugs. Some items may interact with your medicine.  What should I watch for while using this medicine?  Visit your doctor or health care professional for regular checks on your progress. Your doctor or health care professional may order blood tests and other tests to see how you are doing.  Call your doctor or health care professional if you get a cold or other infection while receiving this medicine. Do not treat yourself. This medicine may decrease your body's ability to fight infection.  You should make sure you get enough calcium and vitamin D while you are taking this medicine, unless your doctor tells you not to. Discuss the foods you eat and the vitamins you take with your health care professional.  See your dentist regularly. Brush and floss your teeth as directed. Before you have any dental work done, tell your dentist you are receiving this medicine.  Do   not become pregnant while taking this medicine or for 5 months after stopping it. Women should inform their doctor if they wish to become pregnant or think they might be pregnant. There is a potential for serious side effects to an unborn child. Talk to your health care professional or pharmacist for more information.  What side effects may I notice from receiving this medicine?  Side effects that you should report to your doctor or health care professional as soon as  possible:  -allergic reactions like skin rash, itching or hives, swelling of the face, lips, or tongue  -breathing problems  -chest pain  -fast, irregular heartbeat  -feeling faint or lightheaded, falls  -fever, chills, or any other sign of infection  -muscle spasms, tightening, or twitches  -numbness or tingling  -skin blisters or bumps, or is dry, peels, or red  -slow healing or unexplained pain in the mouth or jaw  -unusual bleeding or bruising  Side effects that usually do not require medical attention (Report these to your doctor or health care professional if they continue or are bothersome.):  -muscle pain  -stomach upset, gas  This list may not describe all possible side effects. Call your doctor for medical advice about side effects. You may report side effects to FDA at 1-800-FDA-1088.  Where should I keep my medicine?  This medicine is only given in a clinic, doctor's office, or other health care setting and will not be stored at home.  NOTE: This sheet is a summary. It may not cover all possible information. If you have questions about this medicine, talk to your doctor, pharmacist, or health care provider.      2016, Elsevier/Gold Standard. (2011-07-02 12:37:47)

## 2016-01-25 ENCOUNTER — Other Ambulatory Visit: Payer: Self-pay | Admitting: Medical Oncology

## 2016-01-25 DIAGNOSIS — C7931 Secondary malignant neoplasm of brain: Secondary | ICD-10-CM

## 2016-01-25 DIAGNOSIS — C3491 Malignant neoplasm of unspecified part of right bronchus or lung: Secondary | ICD-10-CM

## 2016-01-26 ENCOUNTER — Other Ambulatory Visit (HOSPITAL_BASED_OUTPATIENT_CLINIC_OR_DEPARTMENT_OTHER): Payer: Medicaid Other

## 2016-01-26 ENCOUNTER — Ambulatory Visit (HOSPITAL_BASED_OUTPATIENT_CLINIC_OR_DEPARTMENT_OTHER): Payer: Medicaid Other | Admitting: Internal Medicine

## 2016-01-26 ENCOUNTER — Telehealth: Payer: Self-pay | Admitting: Internal Medicine

## 2016-01-26 ENCOUNTER — Encounter: Payer: Self-pay | Admitting: Internal Medicine

## 2016-01-26 VITALS — BP 107/64 | HR 103 | Temp 98.3°F | Resp 16 | Ht 59.0 in | Wt 108.0 lb

## 2016-01-26 DIAGNOSIS — R21 Rash and other nonspecific skin eruption: Secondary | ICD-10-CM | POA: Diagnosis not present

## 2016-01-26 DIAGNOSIS — Z5111 Encounter for antineoplastic chemotherapy: Secondary | ICD-10-CM

## 2016-01-26 DIAGNOSIS — C342 Malignant neoplasm of middle lobe, bronchus or lung: Secondary | ICD-10-CM | POA: Diagnosis not present

## 2016-01-26 DIAGNOSIS — C3491 Malignant neoplasm of unspecified part of right bronchus or lung: Secondary | ICD-10-CM

## 2016-01-26 DIAGNOSIS — C7951 Secondary malignant neoplasm of bone: Secondary | ICD-10-CM

## 2016-01-26 DIAGNOSIS — C7972 Secondary malignant neoplasm of left adrenal gland: Secondary | ICD-10-CM | POA: Diagnosis not present

## 2016-01-26 DIAGNOSIS — C7931 Secondary malignant neoplasm of brain: Secondary | ICD-10-CM

## 2016-01-26 LAB — COMPREHENSIVE METABOLIC PANEL
ALBUMIN: 3.6 g/dL (ref 3.5–5.0)
ALK PHOS: 54 U/L (ref 40–150)
ALT: 17 U/L (ref 0–55)
ANION GAP: 7 meq/L (ref 3–11)
AST: 18 U/L (ref 5–34)
BILIRUBIN TOTAL: 0.54 mg/dL (ref 0.20–1.20)
BUN: 15 mg/dL (ref 7.0–26.0)
CO2: 25 mEq/L (ref 22–29)
Calcium: 9.2 mg/dL (ref 8.4–10.4)
Chloride: 105 mEq/L (ref 98–109)
Creatinine: 0.6 mg/dL (ref 0.6–1.1)
Glucose: 79 mg/dl (ref 70–140)
Potassium: 4.2 mEq/L (ref 3.5–5.1)
Sodium: 137 mEq/L (ref 136–145)
TOTAL PROTEIN: 7 g/dL (ref 6.4–8.3)

## 2016-01-26 LAB — CBC WITH DIFFERENTIAL/PLATELET
BASO%: 0.4 % (ref 0.0–2.0)
Basophils Absolute: 0 10*3/uL (ref 0.0–0.1)
EOS ABS: 0.3 10*3/uL (ref 0.0–0.5)
EOS%: 4.5 % (ref 0.0–7.0)
HCT: 40.6 % (ref 34.8–46.6)
HEMOGLOBIN: 13.7 g/dL (ref 11.6–15.9)
LYMPH%: 16.4 % (ref 14.0–49.7)
MCH: 31.6 pg (ref 25.1–34.0)
MCHC: 33.7 g/dL (ref 31.5–36.0)
MCV: 93.8 fL (ref 79.5–101.0)
MONO#: 0.6 10*3/uL (ref 0.1–0.9)
MONO%: 7.3 % (ref 0.0–14.0)
NEUT%: 71.4 % (ref 38.4–76.8)
NEUTROS ABS: 5.5 10*3/uL (ref 1.5–6.5)
PLATELETS: 361 10*3/uL (ref 145–400)
RBC: 4.33 10*6/uL (ref 3.70–5.45)
RDW: 12.6 % (ref 11.2–14.5)
WBC: 7.6 10*3/uL (ref 3.9–10.3)
lymph#: 1.3 10*3/uL (ref 0.9–3.3)

## 2016-01-26 MED ORDER — HYDROCOD POLST-CPM POLST ER 10-8 MG/5ML PO SUER: 5.0000 mL | Freq: Two times a day (BID) | ORAL | 0 refills | Status: DC | PRN

## 2016-01-26 NOTE — Telephone Encounter (Signed)
Per 1/11 los added lab/fu for February - lab 2/13 and f/u 2/20 - Tuesdays per patient. Central radiology will call re scan.   Monthly lab/injection appointment dates/times remain the same. Gave patient avs report and appointments for January thru March.

## 2016-01-26 NOTE — Progress Notes (Signed)
[place] [phone]) [phone]   Fax:(336) [phone]  OFFICE PROGRESS NOTE  [name], MD [place] [address]  DIAGNOSIS: Stage IV (T2a, N1, M1b) non-small cell lung cancer, adenocarcinoma presented with right middle lobe lung mass, right hilar adenopathy as well as metastatic disease to the bone, brain and left adrenal diagnosed in July 2016.  PRIOR THERAPY: 1) palliative radiotherapy to the brain as well as metastatic bone lesions in the pelvis. 2) Tarceva 150 mg by mouth daily started on 09/25/2014. Status post 5 months of treatment. 3) status post palliative radiotherapy to the right middle lobe lung mass for treatment of hemoptysis.  CURRENT THERAPY: 1) Tarceva 100 mg by mouth daily started 02/19/2015 status post 11 months of treatment. 2) Xgeva 120 g subcutaneously on monthly basis.  INTERVAL HISTORY: Deborah Foster 46 y.o. female came to the clinic today for follow-up visit. The patient is currently on treatment with Tarceva 100 mg by mouth daily and tolerating her treatment well with no significant adverse effects except for mild skin rash but no significant diarrhea. She denied having any chest pain or shortness of breath. She denied having any weight loss or night sweats. She has no fever or chills. She has no nausea, vomiting, diarrhea or constipation. She is here today for evaluation with repeat blood work.   MEDICAL HISTORY: Past Medical History:  Diagnosis Date  . Bone metastasis ([place])   . Hemoptysis   . Hypokalemia   . lung ca dx'd 07/2014  . Lung mass   . Metastasis to adrenal gland ([place])   . Metastasis to brain ([place])   . Pneumonia   . Radiation 08/23/14-09/07/14   Brain/chest and left hip 30 Gy 12 Fx  . URI (upper respiratory infection) 02-02-2015    ALLERGIES:  is allergic to other and doxycycline.  MEDICATIONS:  Current Outpatient Prescriptions  Medication Sig Dispense Refill  . benzonatate (TESSALON)  100 MG capsule Take 1 capsule (100 mg total) by mouth every 8 (eight) hours as needed. 20 capsule 0  . chlorpheniramine-HYDROcodone (TUSSIONEX) 10-8 MG/5ML SUER Take 5 mLs by mouth every 12 (twelve) hours as needed.  0  . clindamycin (CLINDAGEL) 1 % gel Apply 1 application topically 2 (two) times daily.    . Cyanocobalamin (VITAMIN B-12) 2500 MCG SUBL Place 2,500 mcg under the tongue daily.    Marland Kitchen Dextromethorphan Polistirex (DELSYM PO) Take by mouth.    . ENSURE (ENSURE) Take 1 Can by mouth.    . erlotinib (TARCEVA) 100 MG tablet Take 1 tablet (100 mg total) by mouth daily. Take on an empty stomach 1 hour before meals or 2 hours after 30 tablet 1  . ferrous sulfate 325 (65 FE) MG EC tablet Take 325 mg by mouth daily with breakfast.    . fluticasone (VERAMYST) 27.5 MCG/SPRAY nasal spray Place 2 sprays into the nose daily.    . folic acid (FOLVITE) 841 MCG tablet Take 1 tablet (800 mcg total) by mouth daily. 30 tablet 1  . loratadine (CLARITIN) 10 MG tablet Take 1 tablet (10 mg total) by mouth daily. 30 tablet 0  . Nutritional Supplements (JUICE PLUS FIBRE PO) Take 1 capsule by mouth daily. Reported on 07/28/2015    . ranitidine (ZANTAC) 300 MG tablet Take 1 tablet (300 mg total) by mouth at bedtime. 30 tablet 6  . sodium chloride 0.9 % infusion Inject into the vein.    . TURMERIC PO Take 1 capsule  by mouth 2 (two) times daily. Turmeric Superior 750 mg po bid    . Turmeric POWD Take by mouth.     Current Facility-Administered Medications  Medication Dose Route Frequency Provider Last Rate Last Dose  . 0.9 %  sodium chloride infusion  500 mL Intravenous Continuous Mauri Pole, MD        SURGICAL HISTORY:  Past Surgical History:  Procedure Laterality Date  . IR GENERIC HISTORICAL  07/14/2015   IR RADIOLOGIST EVAL & MGMT 07/14/2015 Greggory Keen, MD [facility] INTERV RAD  . VIDEO BRONCHOSCOPY Bilateral 08/09/2014   Procedure: VIDEO BRONCHOSCOPY WITHOUT FLUORO;  Surgeon: [name], MD;   Location[facility] ENDOSCOPY;  Service: Cardiopulmonary;  Laterality: Bilateral;  . VIDEO BRONCHOSCOPY Bilateral 07/06/2015   Procedure: VIDEO BRONCHOSCOPY WITHOUT FLUORO;  Surgeon: [name], MD;  Location: [facility] ENDOSCOPY;  Service: Cardiopulmonary;  Laterality: Bilateral;    REVIEW OF SYSTEMS:  A comprehensive review of systems was negative except for: Respiratory: positive for cough Integument/breast: positive for rash   PHYSICAL EXAMINATION: General appearance: alert, cooperative and no distress Head: Normocephalic, without obvious abnormality, atraumatic Neck: no adenopathy, no JVD, supple, symmetrical, trachea midline and thyroid not enlarged, symmetric, no tenderness/mass/nodules Lymph nodes: Cervical, supraclavicular, and axillary nodes normal. Resp: clear to auscultation bilaterally Back: symmetric, no curvature. ROM normal. No CVA tenderness. Cardio: regular rate and rhythm, S1, S2 normal, no murmur, click, rub or gallop GI: soft, non-tender; bowel sounds normal; no masses,  no organomegaly Extremities: extremities normal, atraumatic, no cyanosis or edema  ECOG PERFORMANCE STATUS: 1 - Symptomatic but completely ambulatory  Blood pressure 107/64, pulse (!) 103, temperature 98.3 F (36.8 C), temperature source Oral, resp. rate 16, height '4\' 11"'$  (1.499 m), weight 108 lb (49 kg), SpO2 98 %.  LABORATORY DATA: Lab Results  Component Value Date   WBC 7.6 01/26/2016   HGB 13.7 01/26/2016   HCT 40.6 01/26/2016   MCV 93.8 01/26/2016   PLT 361 01/26/2016      Chemistry      Component Value Date/Time   NA 137 01/26/2016 1022   K 4.2 01/26/2016 1022   CL 109 07/19/2015 0509   CO2 25 01/26/2016 1022   BUN 15.0 01/26/2016 1022   CREATININE 0.6 01/26/2016 1022      Component Value Date/Time   CALCIUM 9.2 01/26/2016 1022   ALKPHOS 54 01/26/2016 1022   AST 18 01/26/2016 1022   ALT 17 01/26/2016 1022   BILITOT 0.54 01/26/2016 1022       RADIOGRAPHIC STUDIES: No results  found.  ASSESSMENT AND PLAN:  This is a very pleasant 46 years old Asian female with a stage IV non-small cell lung cancer, adenocarcinoma with positive EGFR mutation diagnosed in July 2016. She is currently undergoing treatment with Tarceva 100 mg by mouth daily status post 11 months of the reduced dose. She is tolerating the reduced dose of Tarceva much better except for mild skin rash. I recommended for the patient to continue her treatment with the same dose for now. I would see her back for follow-up visit in one month's for evaluation after repeating CT scan of the chest, abdomen and pelvis for restaging of her disease. For the dry skin and skin rash, she will apply hydrocortisone cream as needed. She was advised to call immediately if she has any concerning symptoms in the interval. The patient voices understanding of current disease status and treatment options and is in agreement with the current care plan.  All  questions were answered. The patient knows to call the clinic with any problems, questions or concerns. We can certainly see the patient much sooner if necessary. I spent 10 minutes counseling the patient face to face. The total time spent in the appointment was 15 minutes.  Disclaimer: This note was dictated with voice recognition software. Similar sounding words can inadvertently be transcribed and may not be corrected upon review.

## 2016-02-06 MED FILL — raNITIdine HCL 300 MG TABS: 300 | 30 days supply | Qty: 30 | Fill #2

## 2016-02-08 ENCOUNTER — Telehealth: Payer: Self-pay | Admitting: *Deleted

## 2016-02-08 NOTE — Telephone Encounter (Signed)
Call from [place].  "We need the fax number for Dr. Julien Nordmann.  The Tarceva order needs renewal."  Provided fax number [phone].

## 2016-02-13 ENCOUNTER — Ambulatory Visit
Admission: RE | Admit: 2016-02-13 | Discharge: 2016-02-13 | Disposition: A | Discharge status: Home / Self Care | Payer: Medicaid Other | Source: Ambulatory Visit | Attending: Radiation Oncology | Admitting: Radiation Oncology

## 2016-02-13 DIAGNOSIS — C7931 Secondary malignant neoplasm of brain: Secondary | ICD-10-CM

## 2016-02-13 DIAGNOSIS — C7949 Secondary malignant neoplasm of other parts of nervous system: Principal | ICD-10-CM

## 2016-02-13 MED ORDER — GADOBENATE DIMEGLUMINE 529 MG/ML IV SOLN
9.0000 mL | Freq: Once | INTRAVENOUS | Status: AC | PRN
  Administered 2016-02-13: 9 mL via INTRAVENOUS

## 2016-02-14 ENCOUNTER — Other Ambulatory Visit: Payer: Self-pay | Admitting: Medical Oncology

## 2016-02-14 ENCOUNTER — Telehealth: Payer: Self-pay | Admitting: Medical Oncology

## 2016-02-14 ENCOUNTER — Ambulatory Visit (HOSPITAL_BASED_OUTPATIENT_CLINIC_OR_DEPARTMENT_OTHER): Payer: Medicaid Other

## 2016-02-14 ENCOUNTER — Other Ambulatory Visit (HOSPITAL_BASED_OUTPATIENT_CLINIC_OR_DEPARTMENT_OTHER): Payer: Medicaid Other

## 2016-02-14 VITALS — BP 109/68 | HR 100 | Temp 98.4°F | Resp 18

## 2016-02-14 DIAGNOSIS — C7931 Secondary malignant neoplasm of brain: Secondary | ICD-10-CM

## 2016-02-14 DIAGNOSIS — R630 Anorexia: Secondary | ICD-10-CM

## 2016-02-14 DIAGNOSIS — C342 Malignant neoplasm of middle lobe, bronchus or lung: Secondary | ICD-10-CM

## 2016-02-14 DIAGNOSIS — C3431 Malignant neoplasm of lower lobe, right bronchus or lung: Secondary | ICD-10-CM

## 2016-02-14 DIAGNOSIS — M9080 Osteopathy in diseases classified elsewhere, unspecified site: Secondary | ICD-10-CM

## 2016-02-14 DIAGNOSIS — R21 Rash and other nonspecific skin eruption: Secondary | ICD-10-CM

## 2016-02-14 DIAGNOSIS — C7951 Secondary malignant neoplasm of bone: Secondary | ICD-10-CM | POA: Diagnosis not present

## 2016-02-14 DIAGNOSIS — C3491 Malignant neoplasm of unspecified part of right bronchus or lung: Secondary | ICD-10-CM

## 2016-02-14 DIAGNOSIS — Z5111 Encounter for antineoplastic chemotherapy: Secondary | ICD-10-CM

## 2016-02-14 DIAGNOSIS — E889 Metabolic disorder, unspecified: Secondary | ICD-10-CM

## 2016-02-14 DIAGNOSIS — M898X9 Other specified disorders of bone, unspecified site: Secondary | ICD-10-CM

## 2016-02-14 LAB — COMPREHENSIVE METABOLIC PANEL
ALT: 23 U/L (ref 0–55)
ANION GAP: 10 meq/L (ref 3–11)
AST: 21 U/L (ref 5–34)
Albumin: 3.8 g/dL (ref 3.5–5.0)
Alkaline Phosphatase: 68 U/L (ref 40–150)
BILIRUBIN TOTAL: 0.66 mg/dL (ref 0.20–1.20)
BUN: 11 mg/dL (ref 7.0–26.0)
CHLORIDE: 103 meq/L (ref 98–109)
CO2: 26 meq/L (ref 22–29)
Calcium: 9.4 mg/dL (ref 8.4–10.4)
Creatinine: 0.7 mg/dL (ref 0.6–1.1)
GLUCOSE: 84 mg/dL (ref 70–140)
Potassium: 3.8 mEq/L (ref 3.5–5.1)
SODIUM: 139 meq/L (ref 136–145)
TOTAL PROTEIN: 7.6 g/dL (ref 6.4–8.3)

## 2016-02-14 LAB — CBC WITH DIFFERENTIAL/PLATELET
BASO%: 0.6 % (ref 0.0–2.0)
Basophils Absolute: 0 10*3/uL (ref 0.0–0.1)
EOS%: 6.4 % (ref 0.0–7.0)
Eosinophils Absolute: 0.4 10*3/uL (ref 0.0–0.5)
HCT: 43.6 % (ref 34.8–46.6)
HGB: 14.9 g/dL (ref 11.6–15.9)
LYMPH%: 19.8 % (ref 14.0–49.7)
MCH: 32.2 pg (ref 25.1–34.0)
MCHC: 34.1 g/dL (ref 31.5–36.0)
MCV: 94.2 fL (ref 79.5–101.0)
MONO#: 0.6 10*3/uL (ref 0.1–0.9)
MONO%: 11.6 % (ref 0.0–14.0)
NEUT%: 61.6 % (ref 38.4–76.8)
NEUTROS ABS: 3.4 10*3/uL (ref 1.5–6.5)
PLATELETS: 393 10*3/uL (ref 145–400)
RBC: 4.63 10*6/uL (ref 3.70–5.45)
RDW: 12.8 % (ref 11.2–14.5)
WBC: 5.6 10*3/uL (ref 3.9–10.3)
lymph#: 1.1 10*3/uL (ref 0.9–3.3)

## 2016-02-14 MED ORDER — DENOSUMAB 120 MG/1.7ML ~~LOC~~ SOLN
120.0000 mg | Freq: Once | SUBCUTANEOUS | Status: AC
  Administered 2016-02-14: 120 mg via SUBCUTANEOUS
  Filled 2016-02-14: qty 1.7

## 2016-02-14 MED ORDER — ERLOTINIB HCL 100 MG PO TABS: 100.0000 mg | ORAL_TABLET | Freq: Every day | ORAL | 1 refills | Status: DC

## 2016-02-14 NOTE — Patient Instructions (Signed)
Denosumab injection  What is this medicine?  DENOSUMAB (den oh sue mab) slows bone breakdown. Prolia is used to treat osteoporosis in women after menopause and in men. Xgeva is used to prevent bone fractures and other bone problems caused by cancer bone metastases. Xgeva is also used to treat giant cell tumor of the bone.  This medicine may be used for other purposes; ask your health care provider or pharmacist if you have questions.  What should I tell my health care provider before I take this medicine?  They need to know if you have any of these conditions:  -dental disease  -eczema  -infection or history of infections  -kidney disease or on dialysis  -low blood calcium or vitamin D  -malabsorption syndrome  -scheduled to have surgery or tooth extraction  -taking medicine that contains denosumab  -thyroid or parathyroid disease  -an unusual reaction to denosumab, other medicines, foods, dyes, or preservatives  -pregnant or trying to get pregnant  -breast-feeding  How should I use this medicine?  This medicine is for injection under the skin. It is given by a health care professional in a hospital or clinic setting.  If you are getting Prolia, a special MedGuide will be given to you by the pharmacist with each prescription and refill. Be sure to read this information carefully each time.  For Prolia, talk to your pediatrician regarding the use of this medicine in children. Special care may be needed. For Xgeva, talk to your pediatrician regarding the use of this medicine in children. While this drug may be prescribed for children as young as 13 years for selected conditions, precautions do apply.  Overdosage: If you think you have taken too much of this medicine contact a poison control center or emergency room at once.  NOTE: This medicine is only for you. Do not share this medicine with others.  What if I miss a dose?  It is important not to miss your dose. Call your doctor or health care professional if you are  unable to keep an appointment.  What may interact with this medicine?  Do not take this medicine with any of the following medications:  -other medicines containing denosumab  This medicine may also interact with the following medications:  -medicines that suppress the immune system  -medicines that treat cancer  -steroid medicines like prednisone or cortisone  This list may not describe all possible interactions. Give your health care provider a list of all the medicines, herbs, non-prescription drugs, or dietary supplements you use. Also tell them if you smoke, drink alcohol, or use illegal drugs. Some items may interact with your medicine.  What should I watch for while using this medicine?  Visit your doctor or health care professional for regular checks on your progress. Your doctor or health care professional may order blood tests and other tests to see how you are doing.  Call your doctor or health care professional if you get a cold or other infection while receiving this medicine. Do not treat yourself. This medicine may decrease your body's ability to fight infection.  You should make sure you get enough calcium and vitamin D while you are taking this medicine, unless your doctor tells you not to. Discuss the foods you eat and the vitamins you take with your health care professional.  See your dentist regularly. Brush and floss your teeth as directed. Before you have any dental work done, tell your dentist you are receiving this medicine.  Do   not become pregnant while taking this medicine or for 5 months after stopping it. Women should inform their doctor if they wish to become pregnant or think they might be pregnant. There is a potential for serious side effects to an unborn child. Talk to your health care professional or pharmacist for more information.  What side effects may I notice from receiving this medicine?  Side effects that you should report to your doctor or health care professional as soon as  possible:  -allergic reactions like skin rash, itching or hives, swelling of the face, lips, or tongue  -breathing problems  -chest pain  -fast, irregular heartbeat  -feeling faint or lightheaded, falls  -fever, chills, or any other sign of infection  -muscle spasms, tightening, or twitches  -numbness or tingling  -skin blisters or bumps, or is dry, peels, or red  -slow healing or unexplained pain in the mouth or jaw  -unusual bleeding or bruising  Side effects that usually do not require medical attention (Report these to your doctor or health care professional if they continue or are bothersome.):  -muscle pain  -stomach upset, gas  This list may not describe all possible side effects. Call your doctor for medical advice about side effects. You may report side effects to FDA at 1-800-FDA-1088.  Where should I keep my medicine?  This medicine is only given in a clinic, doctor's office, or other health care setting and will not be stored at home.  NOTE: This sheet is a summary. It may not cover all possible information. If you have questions about this medicine, talk to your doctor, pharmacist, or health care provider.      2016, Elsevier/Gold Standard. (2011-07-02 12:37:47)

## 2016-02-14 NOTE — Telephone Encounter (Signed)
Refill received by [facility]

## 2016-02-15 ENCOUNTER — Inpatient Hospital Stay: Admission: RE | Admit: 2016-02-15 | Payer: Self-pay | Source: Ambulatory Visit | Admitting: Radiation Oncology

## 2016-02-20 ENCOUNTER — Encounter: Payer: Self-pay | Admitting: Radiation Oncology

## 2016-02-20 ENCOUNTER — Ambulatory Visit
Admission: RE | Admit: 2016-02-20 | Discharge: 2016-02-20 | Disposition: A | Discharge status: Home / Self Care | Payer: Medicaid Other | Source: Ambulatory Visit | Attending: Radiation Oncology | Admitting: Radiation Oncology

## 2016-02-20 DIAGNOSIS — E876 Hypokalemia: Secondary | ICD-10-CM | POA: Diagnosis not present

## 2016-02-20 DIAGNOSIS — R21 Rash and other nonspecific skin eruption: Secondary | ICD-10-CM | POA: Insufficient documentation

## 2016-02-20 DIAGNOSIS — R1310 Dysphagia, unspecified: Secondary | ICD-10-CM | POA: Insufficient documentation

## 2016-02-20 DIAGNOSIS — C7951 Secondary malignant neoplasm of bone: Secondary | ICD-10-CM | POA: Diagnosis present

## 2016-02-20 DIAGNOSIS — C7931 Secondary malignant neoplasm of brain: Secondary | ICD-10-CM | POA: Diagnosis present

## 2016-02-20 DIAGNOSIS — J322 Chronic ethmoidal sinusitis: Secondary | ICD-10-CM | POA: Diagnosis not present

## 2016-02-20 DIAGNOSIS — C7970 Secondary malignant neoplasm of unspecified adrenal gland: Secondary | ICD-10-CM | POA: Insufficient documentation

## 2016-02-20 DIAGNOSIS — C342 Malignant neoplasm of middle lobe, bronchus or lung: Secondary | ICD-10-CM | POA: Diagnosis present

## 2016-02-20 NOTE — Progress Notes (Signed)
Radiation Oncology         [phone] ________________________________  Name: Deborah Foster MRN: [id number]  Date: 02/20/2016  DOB: [birth year]  Follow up Note  CC: [name], MD  [name], *  Diagnosis:   Stage IV T2a, N1, M1b NSCLC, adenocarcinoma of the right middle lobe with metastatic disease to bone and brain.  Interval Since Last Radiation:  6 months   07/21/2015 to 08/03/2015: The Right hilar tumor was treated to 30 Gy in 10 fractions at 3 Gy per fraction.   08/23/14-09/07/14: 30 Gy in 12 fractions was prescribed to the right lung, whole brain, and left hip.  Narrative:  The patient returns today for routine follow-up. Of note her history includes stage IV lung cancer which was identified in the summer of 2016. She received whole brain and right lung radiotherapy along the left hip treatment in August 2016. She's been followed in surveillance since, she recurred locally in the chest, and received radiotherapy in the summer of 2017. She comes today for follow-up of her CNS. A MRI of the brain on 02/13/16 showed stable hemorrhagic brain metastases with no evidence of new or progressive disease. The patient continues on with Tarceva 100 mg PO daily and Xgeva 120 g subcutaneously monthly. The patient presents with a translator, and has a CT scan planned for Friday to assess her disease burden.   On review of systems the patient reports a dry cough, runny nose, occasional frontal headache (none today), occasional left hip discomfort, a fair appetite, and little to no taste. She reports discomfort behind the right ear that would flair and then go away. The patient has a skin rash attributed to the Tarceva. She denies hemoptysis, fevers, chest pain, or shortness of breath. She reports difficulty swallowing capsules. A complete review of systems is obtained and is otherwise negative.  Past Medical History:  Past Medical History:  Diagnosis Date  . Bone metastasis ([place])   .  Hemoptysis   . Hypokalemia   . lung ca dx'd 07/2014  . Lung mass   . Metastasis to adrenal gland ([place])   . Metastasis to brain ([place])   . Pneumonia   . Radiation 08/23/14-09/07/14   Brain/chest and left hip 30 Gy 12 Fx  . URI (upper respiratory infection) 02-02-2015    Past Surgical History: Past Surgical History:  Procedure Laterality Date  . IR GENERIC HISTORICAL  07/14/2015   IR RADIOLOGIST EVAL & MGMT 07/14/2015 Greggory Keen, MD [facility] INTERV RAD  . VIDEO BRONCHOSCOPY Bilateral 08/09/2014   Procedure: VIDEO BRONCHOSCOPY WITHOUT FLUORO;  Surgeon: [name], MD;  Location: [facility] ENDOSCOPY;  Service: Cardiopulmonary;  Laterality: Bilateral;  . VIDEO BRONCHOSCOPY Bilateral 07/06/2015   Procedure: VIDEO BRONCHOSCOPY WITHOUT FLUORO;  Surgeon: [name], MD;  Location: [facility] ENDOSCOPY;  Service: Cardiopulmonary;  Laterality: Bilateral;    Social History:  Social History   Social History  . Marital status: Married    Spouse name: N/A  . Number of children: 3  . Years of education: N/A   Occupational History  . unemployed    Social History Main Topics  . Smoking status: Never Smoker  . Smokeless tobacco: Never Used  . Alcohol use No  . Drug use: No  . Sexual activity: Not Currently   Other Topics Concern  . Not on file   Social History Narrative   Patient moved to [place] in [place].   Had been in [place] since that time.  Family History: Family History  Problem Relation Age of Onset  . Hyperlipidemia Mother     ALLERGIES:  is allergic to other and doxycycline.  Meds: Current Outpatient Prescriptions  Medication Sig Dispense Refill  . benzonatate (TESSALON) 100 MG capsule Take 1 capsule (100 mg total) by mouth every 8 (eight) hours as needed. 20 capsule 0  . chlorpheniramine-HYDROcodone (TUSSIONEX) 10-8 MG/5ML SUER Take 5 mLs by mouth every 12 (twelve) hours as needed. 140 mL 0  . clindamycin (CLINDAGEL) 1 % gel Apply 1 application topically 2  (two) times daily.    . Cyanocobalamin (VITAMIN B-12) 2500 MCG SUBL Place 2,500 mcg under the tongue daily.    Marland Kitchen Dextromethorphan Polistirex (DELSYM PO) Take by mouth.    . ENSURE (ENSURE) Take 1 Can by mouth 2 (two) times daily between meals.     . erlotinib (TARCEVA) 100 MG tablet Take 1 tablet (100 mg total) by mouth daily. Take on an empty stomach 1 hour before meals or 2 hours after 30 tablet 1  . ferrous sulfate 325 (65 FE) MG EC tablet Take 325 mg by mouth daily with breakfast.    . fluticasone (VERAMYST) 27.5 MCG/SPRAY nasal spray Place 2 sprays into the nose daily.    . folic acid (FOLVITE) 025 MCG tablet Take 1 tablet (800 mcg total) by mouth daily. 30 tablet 1  . loratadine (CLARITIN) 10 MG tablet Take 1 tablet (10 mg total) by mouth daily. 30 tablet 0  . ranitidine (ZANTAC) 300 MG tablet Take 1 tablet (300 mg total) by mouth at bedtime. 30 tablet 6  . TURMERIC PO Take 1 capsule by mouth 2 (two) times daily. Turmeric Superior 750 mg po bid    . Nutritional Supplements (JUICE PLUS FIBRE PO) Take 1 capsule by mouth daily. Reported on 07/28/2015    . sodium chloride 0.9 % infusion Inject into the vein.    . Turmeric POWD Take by mouth.     Current Facility-Administered Medications  Medication Dose Route Frequency Provider Last Rate Last Dose  . 0.9 %  sodium chloride infusion  500 mL Intravenous Continuous Mauri Pole, MD        Physical Findings:  height is '4\' 11"'$  (1.499 m) and weight is 108 lb 12.8 oz (49.4 kg). Her oral temperature is 98.6 F (37 C). Her blood pressure is 100/83 and her pulse is 108 (abnormal). Her respiration is 16 and oxygen saturation is 100%.  In general this is a well appearing Asian female in no acute distress. She's alert and oriented x4 and appropriate throughout the examination. Cardiopulmonary assessment is negative for acute distress and she exhibits normal effort. She is neurologically intact grossly.  Lab Findings: Lab Results  Component Value  Date   WBC 5.6 02/14/2016   HGB 14.9 02/14/2016   HCT 43.6 02/14/2016   MCV 94.2 02/14/2016   PLT 393 02/14/2016     Radiographic Findings: Mr Jeri Cos EN Contrast  Result Date: 02/13/2016 CLINICAL DATA:  Lung cancer with brain metastases status post radiotherapy (prescribed treatment not identified in the EMR). Follow-up. EXAM: MRI HEAD WITHOUT AND WITH CONTRAST TECHNIQUE: Multiplanar, multiecho pulse sequences of the brain and surrounding structures were obtained without and with intravenous contrast. CONTRAST:  64m MULTIHANCE GADOBENATE DIMEGLUMINE 529 MG/ML IV SOLN COMPARISON:  11/11/2015 FINDINGS: Brain: 7 brain metastases with chronic blood products are stable from prior and demarcated on series 12. No suspicious growth, enhancement, or interval hemorrhage. No marrow edema or shift. No new  lesion is identified. No acute infarct. Focal left posterior frontal white matter gliosis again noted. No acute hemorrhage or hydrocephalus. Normal brain volume. Vascular: Preserved flow voids Skull and upper cervical spine: Stable appearance of the marrow. Sinuses/Orbits: Right more than left ethmoid sinusitis has developed since prior. IMPRESSION: Stable hemorrhagic brain metastases. No evidence of new or progressive disease. Electronically Signed   By: Monte Fantasia M.D.   On: 02/13/2016 10:07    Impression/Plan: 1. Stage IV T2a, N1, M1b NSCLC, adenocarcinoma of the right middle lobe with metastatic disease to bone and brain.The patient appears to be doing well overall. At this point she appears to be radiographically stable, we would continue to recommend repeat imaging of the brain in 3 month intervals. She will return at that time and keep [place] informed any questions or concerns that arise prior to that visit. She will proceed as well with CT CAP on 02/24/16 and follow up with Dr. Julien Nordmann on 03/06/16.  2. Dysphagia with medication. The patient will increase oral hydration around the time of taking pills  and we will follow this expectantly.    Carola Rhine, PAC  This document serves as a record of services personally performed by Shona Simpson, PA-C. It was created on her behalf by Darcus Austin, a trained medical scribe. The creation of this record is based on the scribe's personal observations and the provider's statements to them. This document has been checked and approved by the attending provider.

## 2016-02-20 NOTE — Progress Notes (Signed)
Follow up s/p  Right hilAR TUMOR 30 gY/10 FX=07/21/15-08/03/15 08/23/14-09/07/14 30gY/12 FX TO RIGHT LUNG, WHOLE BRAIN, LEFT HIP Had dry cough, runny nose, head ache occasionally frontal none today, once in a while discomfort left hip, no c/o pain today, appete fair,  Has little to no taste MRI 02/13/16 here for results, interpreter Emogene Morgan in with patient,  Has CT scheduled 02/24/16 follow up Dr. Julien Nordmann 03/06/16' BP 100/83 (BP Location: Left Arm, Patient Position: Sitting, Cuff Size: Normal)   Pulse (!) 108   Temp 98.6 F (37 C) (Oral)   Resp 16   Ht '4\' 11"'$  (1.499 m)   Wt 108 lb 12.8 oz (49.4 kg)   SpO2 100%   BMI 21.97 kg/m   Wt Readings from Last 3 Encounters:  02/20/16 108 lb 12.8 oz (49.4 kg)  01/26/16 108 lb (49 kg)  12/26/15 109 lb (49.4 kg)

## 2016-02-21 ENCOUNTER — Telehealth: Payer: Self-pay | Admitting: Internal Medicine

## 2016-02-21 NOTE — Telephone Encounter (Signed)
sw pt using language line to inform pt of r/s MD appt to 2/13 with Lab at 1030 am per LOS

## 2016-02-24 ENCOUNTER — Ambulatory Visit (HOSPITAL_COMMUNITY): Payer: Medicaid Other

## 2016-02-27 ENCOUNTER — Other Ambulatory Visit: Payer: Self-pay | Admitting: Internal Medicine

## 2016-02-27 ENCOUNTER — Other Ambulatory Visit: Payer: Self-pay | Admitting: *Deleted

## 2016-02-27 DIAGNOSIS — C3490 Malignant neoplasm of unspecified part of unspecified bronchus or lung: Secondary | ICD-10-CM

## 2016-02-28 ENCOUNTER — Other Ambulatory Visit (HOSPITAL_BASED_OUTPATIENT_CLINIC_OR_DEPARTMENT_OTHER): Payer: Medicaid Other

## 2016-02-28 ENCOUNTER — Other Ambulatory Visit: Payer: Self-pay | Admitting: Medical Oncology

## 2016-02-28 ENCOUNTER — Encounter: Payer: Self-pay | Admitting: Internal Medicine

## 2016-02-28 ENCOUNTER — Telehealth: Payer: Self-pay | Admitting: Internal Medicine

## 2016-02-28 ENCOUNTER — Telehealth: Payer: Self-pay | Admitting: *Deleted

## 2016-02-28 ENCOUNTER — Ambulatory Visit (HOSPITAL_BASED_OUTPATIENT_CLINIC_OR_DEPARTMENT_OTHER): Payer: Medicaid Other | Admitting: Internal Medicine

## 2016-02-28 DIAGNOSIS — C7931 Secondary malignant neoplasm of brain: Secondary | ICD-10-CM | POA: Diagnosis not present

## 2016-02-28 DIAGNOSIS — E876 Hypokalemia: Secondary | ICD-10-CM

## 2016-02-28 DIAGNOSIS — C342 Malignant neoplasm of middle lobe, bronchus or lung: Secondary | ICD-10-CM

## 2016-02-28 DIAGNOSIS — C7951 Secondary malignant neoplasm of bone: Secondary | ICD-10-CM

## 2016-02-28 DIAGNOSIS — C3490 Malignant neoplasm of unspecified part of unspecified bronchus or lung: Secondary | ICD-10-CM

## 2016-02-28 DIAGNOSIS — C3491 Malignant neoplasm of unspecified part of right bronchus or lung: Secondary | ICD-10-CM

## 2016-02-28 LAB — CBC WITH DIFFERENTIAL/PLATELET
BASO%: 0.6 % (ref 0.0–2.0)
BASOS ABS: 0 10*3/uL (ref 0.0–0.1)
EOS ABS: 0.2 10*3/uL (ref 0.0–0.5)
EOS%: 2.6 % (ref 0.0–7.0)
HEMATOCRIT: 42.2 % (ref 34.8–46.6)
HEMOGLOBIN: 14.4 g/dL (ref 11.6–15.9)
LYMPH#: 1 10*3/uL (ref 0.9–3.3)
LYMPH%: 14.6 % (ref 14.0–49.7)
MCH: 32.3 pg (ref 25.1–34.0)
MCHC: 34.2 g/dL (ref 31.5–36.0)
MCV: 94.6 fL (ref 79.5–101.0)
MONO#: 0.5 10*3/uL (ref 0.1–0.9)
MONO%: 7.7 % (ref 0.0–14.0)
NEUT#: 4.9 10*3/uL (ref 1.5–6.5)
NEUT%: 74.5 % (ref 38.4–76.8)
PLATELETS: 411 10*3/uL — AB (ref 145–400)
RBC: 4.46 10*6/uL (ref 3.70–5.45)
RDW: 12.8 % (ref 11.2–14.5)
WBC: 6.5 10*3/uL (ref 3.9–10.3)

## 2016-02-28 LAB — COMPREHENSIVE METABOLIC PANEL
ALK PHOS: 53 U/L (ref 40–150)
ALT: 17 U/L (ref 0–55)
ANION GAP: 7 meq/L (ref 3–11)
AST: 16 U/L (ref 5–34)
Albumin: 3.8 g/dL (ref 3.5–5.0)
BUN: 12.6 mg/dL (ref 7.0–26.0)
CALCIUM: 9.5 mg/dL (ref 8.4–10.4)
CO2: 27 mEq/L (ref 22–29)
Chloride: 104 mEq/L (ref 98–109)
Creatinine: 0.7 mg/dL (ref 0.6–1.1)
Glucose: 92 mg/dl (ref 70–140)
Potassium: 4.2 mEq/L (ref 3.5–5.1)
Sodium: 137 mEq/L (ref 136–145)
Total Bilirubin: 0.67 mg/dL (ref 0.20–1.20)
Total Protein: 7.2 g/dL (ref 6.4–8.3)

## 2016-02-28 MED ORDER — BENZONATATE 100 MG PO CAPS: 100.0000 mg | ORAL_CAPSULE | Freq: Three times a day (TID) | ORAL | 0 refills | Status: DC | PRN

## 2016-02-28 MED FILL — BENZONATATE 100 MG CAP: 100 | 7 days supply | Qty: 20 | Fill #0

## 2016-02-28 NOTE — Progress Notes (Signed)
[facility] [phone]   Fax:([phone]) [phone]  OFFICE PROGRESS NOTE  [name], MD [place] [address]  DIAGNOSIS: Stage IV (T2a, N1, M1b) non-small cell lung cancer, adenocarcinoma presented with right middle lobe lung mass, right hilar adenopathy as well as metastatic disease to the bone, brain and left adrenal diagnosed in July 2016.  PRIOR THERAPY: 1) palliative radiotherapy to the brain as well as metastatic bone lesions in the pelvis. 2) Tarceva 150 mg by mouth daily started on 09/25/2014. Status post 5 months of treatment. 3) status post palliative radiotherapy to the right middle lobe lung mass for treatment of hemoptysis.  CURRENT THERAPY: 1) Tarceva 100 mg by mouth daily started 02/19/2015 status post 12 months of treatment. 2) Xgeva 120 g subcutaneously on monthly basis.  INTERVAL HISTORY: Deborah Foster 46 y.o. female came to the clinic today for follow-up visit. The patient is feeling fine with no specific complaints except for dry cough. She also has mild skin rash on the face. She denied having any diarrhea. She is tolerating her treatment with Tarceva fairly well was no significant adverse effects. She denied having any chest pain, shortness of breath or hemoptysis. She denied having any fever or chills. She has no nausea, vomiting, diarrhea or constipation. She was supposed to have repeat CT scan of the chest, abdomen and pelvis before this visit but unfortunately it is scheduled to be performed next week. She is here today for evaluation and repeat blood work.   MEDICAL HISTORY: Past Medical History:  Diagnosis Date  . Bone metastasis ([place])   . Hemoptysis   . Hypokalemia   . lung ca dx'd 07/2014  . Lung mass   . Metastasis to adrenal gland ([place])   . Metastasis to brain ([place])   . Pneumonia   . Radiation 08/23/14-09/07/14   Brain/chest and left hip 30 Gy 12 Fx  . URI (upper respiratory infection)  02-02-2015    ALLERGIES:  is allergic to other and doxycycline.  MEDICATIONS:  Current Outpatient Prescriptions  Medication Sig Dispense Refill  . benzonatate (TESSALON) 100 MG capsule Take 1 capsule (100 mg total) by mouth every 8 (eight) hours as needed. 20 capsule 0  . chlorpheniramine-HYDROcodone (TUSSIONEX) 10-8 MG/5ML SUER Take 5 mLs by mouth every 12 (twelve) hours as needed. 140 mL 0  . clindamycin (CLINDAGEL) 1 % gel Apply 1 application topically 2 (two) times daily.    . Cyanocobalamin (VITAMIN B-12) 2500 MCG SUBL Place 2,500 mcg under the tongue daily.    Marland Kitchen Dextromethorphan Polistirex (DELSYM PO) Take by mouth.    . ENSURE (ENSURE) Take 1 Can by mouth 2 (two) times daily between meals.     . erlotinib (TARCEVA) 100 MG tablet Take 1 tablet (100 mg total) by mouth daily. Take on an empty stomach 1 hour before meals or 2 hours after 30 tablet 1  . ferrous sulfate 325 (65 FE) MG EC tablet Take 325 mg by mouth daily with breakfast.    . fluticasone (VERAMYST) 27.5 MCG/SPRAY nasal spray Place 2 sprays into the nose daily.    . folic acid (FOLVITE) 443 MCG tablet Take 1 tablet (800 mcg total) by mouth daily. 30 tablet 1  . loratadine (CLARITIN) 10 MG tablet Take 1 tablet (10 mg total) by mouth daily. 30 tablet 0  . Nutritional Supplements (JUICE PLUS FIBRE PO) Take 1 capsule by mouth daily. Reported on 07/28/2015    . ranitidine (  ZANTAC) 300 MG tablet Take 1 tablet (300 mg total) by mouth at bedtime. 30 tablet 6  . sodium chloride 0.9 % infusion Inject into the vein.    . TURMERIC PO Take 1 capsule by mouth 2 (two) times daily. Turmeric Superior 750 mg po bid    . Turmeric POWD Take by mouth.     Current Facility-Administered Medications  Medication Dose Route Frequency Provider Last Rate Last Dose  . 0.9 %  sodium chloride infusion  500 mL Intravenous Continuous Napoleon Form, MD        SURGICAL HISTORY:  Past Surgical History:  Procedure Laterality Date  . IR GENERIC  HISTORICAL  07/14/2015   IR RADIOLOGIST EVAL & MGMT 07/14/2015 Berdine Dance, MD [facility] INTERV RAD  . VIDEO BRONCHOSCOPY Bilateral 08/09/2014   Procedure: VIDEO BRONCHOSCOPY WITHOUT FLUORO;  Surgeon: Lupita Leash, MD;  Location[facility] ENDOSCOPY;  Service: Cardiopulmonary;  Laterality: Bilateral;  . VIDEO BRONCHOSCOPY Bilateral 07/06/2015   Procedure: VIDEO BRONCHOSCOPY WITHOUT FLUORO;  Surgeon: Lupita Leash, MD;  Location: [facility] ENDOSCOPY;  Service: Cardiopulmonary;  Laterality: Bilateral;    REVIEW OF SYSTEMS:  A comprehensive review of systems was negative except for: Respiratory: positive for cough Integument/breast: positive for rash   PHYSICAL EXAMINATION: General appearance: alert, cooperative and no distress Head: Normocephalic, without obvious abnormality, atraumatic Neck: no adenopathy, no JVD, supple, symmetrical, trachea midline and thyroid not enlarged, symmetric, no tenderness/mass/nodules Lymph nodes: Cervical, supraclavicular, and axillary nodes normal. Resp: clear to auscultation bilaterally Back: symmetric, no curvature. ROM normal. No CVA tenderness. Cardio: regular rate and rhythm, S1, S2 normal, no murmur, click, rub or gallop GI: soft, non-tender; bowel sounds normal; no masses,  no organomegaly Extremities: extremities normal, atraumatic, no cyanosis or edema  ECOG PERFORMANCE STATUS: 1 - Symptomatic but completely ambulatory  Blood pressure 103/71, pulse (!) 103, temperature 98.6 F (37 C), temperature source Oral, resp. rate 18, height 4\' 11"  (1.499 m), weight 109 lb (49.4 kg), SpO2 98 %.  LABORATORY DATA: Lab Results  Component Value Date   WBC 6.5 02/28/2016   HGB 14.4 02/28/2016   HCT 42.2 02/28/2016   MCV 94.6 02/28/2016   PLT 411 (H) 02/28/2016      Chemistry      Component Value Date/Time   NA 137 02/28/2016 0849   K 4.2 02/28/2016 0849   CL 109 07/19/2015 0509   CO2 27 02/28/2016 0849   BUN 12.6 02/28/2016 0849   CREATININE 0.7 02/28/2016  0849      Component Value Date/Time   CALCIUM 9.5 02/28/2016 0849   ALKPHOS 53 02/28/2016 0849   AST 16 02/28/2016 0849   ALT 17 02/28/2016 0849   BILITOT 0.67 02/28/2016 0849       RADIOGRAPHIC STUDIES: Mr 03/01/2016 Laqueta Jean Contrast  Result Date: 02/13/2016 CLINICAL DATA:  Lung cancer with brain metastases status post radiotherapy (prescribed treatment not identified in the EMR). Follow-up. EXAM: MRI HEAD WITHOUT AND WITH CONTRAST TECHNIQUE: Multiplanar, multiecho pulse sequences of the brain and surrounding structures were obtained without and with intravenous contrast. CONTRAST:  58mL MULTIHANCE GADOBENATE DIMEGLUMINE 529 MG/ML IV SOLN COMPARISON:  11/11/2015 FINDINGS: Brain: 7 brain metastases with chronic blood products are stable from prior and demarcated on series 12. No suspicious growth, enhancement, or interval hemorrhage. No marrow edema or shift. No new lesion is identified. No acute infarct. Focal left posterior frontal white matter gliosis again noted. No acute hemorrhage or hydrocephalus. Normal brain volume. Vascular: Preserved flow voids Skull  and upper cervical spine: Stable appearance of the marrow. Sinuses/Orbits: Right more than left ethmoid sinusitis has developed since prior. IMPRESSION: Stable hemorrhagic brain metastases. No evidence of new or progressive disease. Electronically Signed   By: Monte Fantasia M.D.   On: 02/13/2016 10:07    ASSESSMENT AND PLAN:  This is a very pleasant 46 years old Asian female with stage IV non-small cell lung cancer, adenocarcinoma with positive EGFR mutation diagnosed in July 2016. She was started on treatment with Tarceva 150 mg by mouth daily but this was discontinued secondary to intolerance and she is currently on Tarceva 100 mg by mouth daily status post 12 months. She is tolerating the lower dose much better. She is scheduled to have repeat CT scan of the chest and abdomen next week. I will see her back for follow-up visit in one month's  for reevaluation unless his scan showed any concerning findings. She will continue Xgeva for the metastatic bone disease. I gave the patient refill for Tessalon today. She was advised to call immediately if she has any concerning symptoms in the interval. The patient voices understanding of current disease status and treatment options and is in agreement with the current care plan.  All questions were answered. The patient knows to call the clinic with any problems, questions or concerns. We can certainly see the patient much sooner if necessary. I spent 10 minutes counseling the patient face to face. The total time spent in the appointment was 15 minutes.   Disclaimer: This note was dictated with voice recognition software. Similar sounding words can inadvertently be transcribed and may not be corrected upon review.

## 2016-02-28 NOTE — Telephone Encounter (Signed)
Appointments scheduled per 2/13 LOS. Patient given AVS report and calendars with future scheduled appointments. °

## 2016-02-28 NOTE — Telephone Encounter (Signed)
"  This is Deborah Foster from [facility].  This patient of Dr. Julien Nordmann is here for [facility] but it was sent to a specialty Pharmacy."  Verbal order provided to [facility] at this time.  No further questions.

## 2016-02-29 ENCOUNTER — Telehealth: Payer: Self-pay | Admitting: Medical Oncology

## 2016-02-29 NOTE — Telephone Encounter (Signed)
[facility] received referral and are processing information now.

## 2016-03-02 ENCOUNTER — Encounter (HOSPITAL_COMMUNITY): Payer: Self-pay

## 2016-03-02 ENCOUNTER — Ambulatory Visit (HOSPITAL_COMMUNITY)
Admission: RE | Admit: 2016-03-02 | Discharge: 2016-03-02 | Disposition: A | Discharge status: Home / Self Care | Payer: Medicaid Other | Source: Ambulatory Visit | Attending: Internal Medicine | Admitting: Internal Medicine

## 2016-03-02 DIAGNOSIS — E278 Other specified disorders of adrenal gland: Secondary | ICD-10-CM | POA: Diagnosis not present

## 2016-03-02 DIAGNOSIS — C3491 Malignant neoplasm of unspecified part of right bronchus or lung: Secondary | ICD-10-CM | POA: Insufficient documentation

## 2016-03-02 DIAGNOSIS — R590 Localized enlarged lymph nodes: Secondary | ICD-10-CM | POA: Diagnosis not present

## 2016-03-02 DIAGNOSIS — C7951 Secondary malignant neoplasm of bone: Secondary | ICD-10-CM | POA: Insufficient documentation

## 2016-03-02 DIAGNOSIS — R21 Rash and other nonspecific skin eruption: Secondary | ICD-10-CM

## 2016-03-02 DIAGNOSIS — Z5111 Encounter for antineoplastic chemotherapy: Secondary | ICD-10-CM

## 2016-03-02 MED ORDER — IOPAMIDOL (ISOVUE-300) INJECTION 61%
INTRAVENOUS | Status: AC
  Filled 2016-03-02: qty 75

## 2016-03-02 MED ORDER — IOPAMIDOL (ISOVUE-300) INJECTION 61%
75.0000 mL | Freq: Once | INTRAVENOUS | Status: AC | PRN
  Administered 2016-03-02: 75 mL via INTRAVENOUS

## 2016-03-02 MED ORDER — SODIUM CHLORIDE 0.9 % IJ SOLN
INTRAMUSCULAR | Status: AC
Start: 2016-03-02 — End: 2016-03-02
  Filled 2016-03-02: qty 50

## 2016-03-05 ENCOUNTER — Other Ambulatory Visit: Payer: Self-pay | Admitting: Internal Medicine

## 2016-03-05 DIAGNOSIS — C7951 Secondary malignant neoplasm of bone: Secondary | ICD-10-CM

## 2016-03-05 DIAGNOSIS — R630 Anorexia: Secondary | ICD-10-CM

## 2016-03-05 DIAGNOSIS — C7931 Secondary malignant neoplasm of brain: Secondary | ICD-10-CM

## 2016-03-05 DIAGNOSIS — C3491 Malignant neoplasm of unspecified part of right bronchus or lung: Secondary | ICD-10-CM

## 2016-03-06 ENCOUNTER — Other Ambulatory Visit: Payer: Self-pay | Admitting: Medical Oncology

## 2016-03-06 ENCOUNTER — Encounter: Payer: Self-pay | Admitting: Gastroenterology

## 2016-03-06 ENCOUNTER — Ambulatory Visit (INDEPENDENT_AMBULATORY_CARE_PROVIDER_SITE_OTHER): Payer: Medicaid Other | Admitting: Gastroenterology

## 2016-03-06 ENCOUNTER — Ambulatory Visit: Payer: Self-pay | Admitting: Internal Medicine

## 2016-03-06 VITALS — BP 86/62 | HR 104 | Ht 59.5 in | Wt 108.4 lb

## 2016-03-06 DIAGNOSIS — R059 Cough, unspecified: Secondary | ICD-10-CM

## 2016-03-06 DIAGNOSIS — K219 Gastro-esophageal reflux disease without esophagitis: Secondary | ICD-10-CM | POA: Diagnosis not present

## 2016-03-06 DIAGNOSIS — C3490 Malignant neoplasm of unspecified part of unspecified bronchus or lung: Secondary | ICD-10-CM

## 2016-03-06 DIAGNOSIS — R05 Cough: Secondary | ICD-10-CM

## 2016-03-06 MED ORDER — BENZONATATE 100 MG PO CAPS: 100.0000 mg | ORAL_CAPSULE | Freq: Three times a day (TID) | ORAL | 0 refills | Status: DC | PRN

## 2016-03-06 MED ORDER — RANITIDINE HCL 300 MG PO TABS: 300.0000 mg | ORAL_TABLET | Freq: Every day | ORAL | 6 refills | Status: DC

## 2016-03-06 MED FILL — BENZONATATE 100 MG CAP: 100 | 6 days supply | Qty: 20 | Fill #0

## 2016-03-06 MED FILL — raNITIdine HCL 300 MG TABS: 300 | 30 days supply | Qty: 30 | Fill #0

## 2016-03-06 NOTE — Progress Notes (Signed)
Deborah Foster    [id number]    [birth year]  Primary Care Physician:ELKINS,WILSON [name], MD  Referring Physician: [name], MD [address] [place], [place] [address]  Chief complaint:  Cough, GERD  HPI: 46 year old female with stage IV non-small cell lung cancer status post palliative radiotherapy and currently on chemotherapy Tarceva here for follow-up visit with complaints of intermittent cough, heartburn, acid taste and regurgitation. Due to interaction between PPI and Tarceva, she was not prescribed PPI. She is currently taking ranitidine at bedtime with some improvement.  She also has allergies with runny nose and postnasal drip. Most recent chest CT is concerning for progression of disease with enlarging adrenal nodule and right paratracheal lymph node.   Outpatient Encounter Prescriptions as of 03/06/2016  Medication Sig  . benzonatate (TESSALON) 100 MG capsule Take 1 capsule (100 mg total) by mouth every 8 (eight) hours as needed.  . chlorpheniramine-HYDROcodone (TUSSIONEX) 10-8 MG/5ML SUER Take 5 mLs by mouth every 12 (twelve) hours as needed.  . clindamycin (CLINDAGEL) 1 % gel Apply 1 application topically 2 (two) times daily.  . Cyanocobalamin (VITAMIN B-12) 2500 MCG SUBL Place 2,500 mcg under the tongue daily.  Marland Kitchen Dextromethorphan Polistirex (DELSYM PO) Take by mouth.  . ENSURE (ENSURE) Take 1 Can by mouth 2 (two) times daily between meals.   . erlotinib (TARCEVA) 100 MG tablet Take 1 tablet (100 mg total) by mouth daily. Take on an empty stomach 1 hour before meals or 2 hours after  . ferrous sulfate 325 (65 FE) MG EC tablet Take 325 mg by mouth daily with breakfast.  . fluticasone (VERAMYST) 27.5 MCG/SPRAY nasal spray Place 2 sprays into the nose daily.  . folic acid (FOLVITE) 798 MCG tablet Take 1 tablet (800 mcg total) by mouth daily.  Marland Kitchen loratadine (CLARITIN) 10 MG tablet Take 1 tablet (10 mg total) by mouth daily.  . Nutritional Supplements  (JUICE PLUS FIBRE PO) Take 1 capsule by mouth daily. Reported on 07/28/2015  . ranitidine (ZANTAC) 300 MG tablet Take 1 tablet (300 mg total) by mouth at bedtime.  . sodium chloride 0.9 % infusion Inject into the vein.  . TURMERIC PO Take 1 capsule by mouth 2 (two) times daily. Turmeric Superior 750 mg po bid  . Turmeric POWD Take by mouth.  . [DISCONTINUED] TARCEVA 100 MG tablet TAKE 1 TABLET BY MOUTH DAILY. TAKE ON AN EMPTY STOMACH 1 HOUR BEFORE OR 2 HOURS AFTER MEALS.  . [DISCONTINUED] 0.9 %  sodium chloride infusion    No facility-administered encounter medications on file as of 03/06/2016.     Allergies as of 03/06/2016 - Review Complete 03/06/2016  Allergen Reaction Noted  . Other Other (See Comments) 04/30/2015  . Doxycycline Nausea And Vomiting 12/07/2014    Past Medical History:  Diagnosis Date  . Bone metastasis ([place])   . Hemoptysis   . Hypokalemia   . lung ca dx'd 07/2014  . Lung mass   . Metastasis to adrenal gland ([place])   . Metastasis to brain ([place])   . Pneumonia   . Radiation 08/23/14-09/07/14   Brain/chest and left hip 30 Gy 12 Fx  . URI (upper respiratory infection) 02-02-2015    Past Surgical History:  Procedure Laterality Date  . IR GENERIC HISTORICAL  07/14/2015   IR RADIOLOGIST EVAL & MGMT 07/14/2015 Greggory Keen, MD [facility] INTERV RAD  . VIDEO BRONCHOSCOPY Bilateral 08/09/2014   Procedure: VIDEO BRONCHOSCOPY WITHOUT FLUORO;  Surgeon: [name], MD;  Location: [facility];  Service: Cardiopulmonary;  Laterality: Bilateral;  . VIDEO BRONCHOSCOPY Bilateral 07/06/2015   Procedure: VIDEO BRONCHOSCOPY WITHOUT FLUORO;  Surgeon: [name], MD;  Location: [facility] ENDOSCOPY;  Service: Cardiopulmonary;  Laterality: Bilateral;    Family History  Problem Relation Age of Onset  . Hyperlipidemia Mother     Social History   Social History  . Marital status: Married    Spouse name: N/A  . Number of children: 3  . Years of education: N/A   Occupational  History  . unemployed    Social History Main Topics  . Smoking status: Never Smoker  . Smokeless tobacco: Never Used  . Alcohol use No  . Drug use: No  . Sexual activity: Not Currently   Other Topics Concern  . Not on file   Social History Narrative   Patient moved to [place] in [place].   Had been in [place] since that time.      Review of systems: Review of Systems  Constitutional: Negative for fever and chills.  HENT: Positive for runny nose, postnasal drip, sore throat Eyes: Negative for blurred vision.  Respiratory: Negative for shortness of breath. Positive for cough and wheezing.   Cardiovascular: Negative for chest pain and palpitations.  Gastrointestinal: as per HPI Genitourinary: Negative for dysuria, urgency, frequency and hematuria.  Musculoskeletal: Negative for myalgias, back pain and joint pain.  Skin: Negative for itching and rash.  Neurological: Negative for dizziness, tremors, focal weakness, seizures and loss of consciousness.  Endo/Heme/Allergies: Positive for seasonal allergies.  Psychiatric/Behavioral: Negative for depression, suicidal ideas and hallucinations.  All other systems reviewed and are negative.   Physical Exam: Vitals:   03/06/16 0823  BP: (!) 86/62  Pulse: (!) 104   Body mass index is 21.52 kg/m. Gen:      No acute distress HEENT:  EOMI, sclera anicteric Neck:     No masses; no thyromegaly Lungs:    Clear to auscultation bilaterally; normal respiratory effort CV:         Regular rate and rhythm; no murmurs Abd:      + bowel sounds; soft, non-tender; no palpable masses, no distension Ext:    No edema; adequate peripheral perfusion Skin:      Warm and dry; no rash Neuro: alert and oriented x 3 Psych: normal mood and affect  Data Reviewed:  Reviewed labs, radiology imaging, old records and pertinent past GI work up   Assessment and Plan/Recommendations:  46 year old female with stage IV non-small cell lung cancer  here with complaints of cough Cough likely multifactorial, secondary to GERD, postnasal drip, progression of lung cancer has a enlarging right paratracheal lymph node Advised patient to continue to follow antireflux measures and ranitidine at bedtime Cannot prescribe PPI due to interaction with Tarceva Can use Gaviscon after meals as needed and also antihistamine as needed to decrease postnasal drip Follow up with oncology Return as needed 15 minutes was spent face-to-face with the patient. Greater than 50% of the time used for counseling as well as treatment plan and follow-up. She had multiple questions which were answered to her satisfaction  K. Denzil Magnuson , MD [phone] Mon-Fri 8a-5p [phone] after 5p, weekends, holidays  CC: [name], *

## 2016-03-06 NOTE — Patient Instructions (Signed)
We will send Zantac 300 mg to your pharmacy to use 1 at bedtime  Use OTC gaviscon after meals as needed three times a day

## 2016-03-13 ENCOUNTER — Other Ambulatory Visit (HOSPITAL_BASED_OUTPATIENT_CLINIC_OR_DEPARTMENT_OTHER): Payer: Medicaid Other

## 2016-03-13 ENCOUNTER — Ambulatory Visit (HOSPITAL_BASED_OUTPATIENT_CLINIC_OR_DEPARTMENT_OTHER): Payer: Medicaid Other

## 2016-03-13 VITALS — BP 103/58 | HR 100 | Temp 98.2°F | Resp 20

## 2016-03-13 DIAGNOSIS — C342 Malignant neoplasm of middle lobe, bronchus or lung: Secondary | ICD-10-CM

## 2016-03-13 DIAGNOSIS — M9080 Osteopathy in diseases classified elsewhere, unspecified site: Secondary | ICD-10-CM

## 2016-03-13 DIAGNOSIS — C3491 Malignant neoplasm of unspecified part of right bronchus or lung: Secondary | ICD-10-CM

## 2016-03-13 DIAGNOSIS — M898X9 Other specified disorders of bone, unspecified site: Secondary | ICD-10-CM

## 2016-03-13 DIAGNOSIS — C7951 Secondary malignant neoplasm of bone: Secondary | ICD-10-CM

## 2016-03-13 DIAGNOSIS — E889 Metabolic disorder, unspecified: Secondary | ICD-10-CM

## 2016-03-13 DIAGNOSIS — C3431 Malignant neoplasm of lower lobe, right bronchus or lung: Secondary | ICD-10-CM

## 2016-03-13 DIAGNOSIS — C3490 Malignant neoplasm of unspecified part of unspecified bronchus or lung: Secondary | ICD-10-CM

## 2016-03-13 LAB — CBC WITH DIFFERENTIAL/PLATELET
BASO%: 0.9 % (ref 0.0–2.0)
BASOS ABS: 0 10*3/uL (ref 0.0–0.1)
EOS%: 3.6 % (ref 0.0–7.0)
Eosinophils Absolute: 0.2 10*3/uL (ref 0.0–0.5)
HCT: 41.7 % (ref 34.8–46.6)
HEMOGLOBIN: 14.3 g/dL (ref 11.6–15.9)
LYMPH%: 16 % (ref 14.0–49.7)
MCH: 32.8 pg (ref 25.1–34.0)
MCHC: 34.3 g/dL (ref 31.5–36.0)
MCV: 95.6 fL (ref 79.5–101.0)
MONO#: 0.5 10*3/uL (ref 0.1–0.9)
MONO%: 9.4 % (ref 0.0–14.0)
NEUT%: 70.1 % (ref 38.4–76.8)
NEUTROS ABS: 3.8 10*3/uL (ref 1.5–6.5)
Platelets: 371 10*3/uL (ref 145–400)
RBC: 4.36 10*6/uL (ref 3.70–5.45)
RDW: 13.4 % (ref 11.2–14.5)
WBC: 5.4 10*3/uL (ref 3.9–10.3)
lymph#: 0.9 10*3/uL (ref 0.9–3.3)

## 2016-03-13 LAB — COMPREHENSIVE METABOLIC PANEL
ALBUMIN: 3.7 g/dL (ref 3.5–5.0)
ALK PHOS: 54 U/L (ref 40–150)
ALT: 19 U/L (ref 0–55)
AST: 20 U/L (ref 5–34)
Anion Gap: 7 mEq/L (ref 3–11)
BUN: 10.8 mg/dL (ref 7.0–26.0)
CO2: 25 meq/L (ref 22–29)
Calcium: 8.9 mg/dL (ref 8.4–10.4)
Chloride: 106 mEq/L (ref 98–109)
Creatinine: 0.7 mg/dL (ref 0.6–1.1)
EGFR: >90 mL/min/{1.73_m2} (ref >90)
GLUCOSE: 83 mg/dL (ref 70–140)
POTASSIUM: 3.9 meq/L (ref 3.5–5.1)
SODIUM: 139 meq/L (ref 136–145)
Total Bilirubin: 0.47 mg/dL (ref 0.20–1.20)
Total Protein: 6.8 g/dL (ref 6.4–8.3)

## 2016-03-13 MED ORDER — DENOSUMAB 120 MG/1.7ML ~~LOC~~ SOLN
120.0000 mg | Freq: Once | SUBCUTANEOUS | Status: AC
  Administered 2016-03-13: 120 mg via SUBCUTANEOUS
  Filled 2016-03-13: qty 1.7

## 2016-03-13 NOTE — Patient Instructions (Signed)
Denosumab injection  What is this medicine?  DENOSUMAB (den oh sue mab) slows bone breakdown. Prolia is used to treat osteoporosis in women after menopause and in men. Xgeva is used to prevent bone fractures and other bone problems caused by cancer bone metastases. Xgeva is also used to treat giant cell tumor of the bone.  This medicine may be used for other purposes; ask your health care provider or pharmacist if you have questions.  What should I tell my health care provider before I take this medicine?  They need to know if you have any of these conditions:  -dental disease  -eczema  -infection or history of infections  -kidney disease or on dialysis  -low blood calcium or vitamin D  -malabsorption syndrome  -scheduled to have surgery or tooth extraction  -taking medicine that contains denosumab  -thyroid or parathyroid disease  -an unusual reaction to denosumab, other medicines, foods, dyes, or preservatives  -pregnant or trying to get pregnant  -breast-feeding  How should I use this medicine?  This medicine is for injection under the skin. It is given by a health care professional in a hospital or clinic setting.  If you are getting Prolia, a special MedGuide will be given to you by the pharmacist with each prescription and refill. Be sure to read this information carefully each time.  For Prolia, talk to your pediatrician regarding the use of this medicine in children. Special care may be needed. For Xgeva, talk to your pediatrician regarding the use of this medicine in children. While this drug may be prescribed for children as young as 13 years for selected conditions, precautions do apply.  Overdosage: If you think you have taken too much of this medicine contact a poison control center or emergency room at once.  NOTE: This medicine is only for you. Do not share this medicine with others.  What if I miss a dose?  It is important not to miss your dose. Call your doctor or health care professional if you are  unable to keep an appointment.  What may interact with this medicine?  Do not take this medicine with any of the following medications:  -other medicines containing denosumab  This medicine may also interact with the following medications:  -medicines that suppress the immune system  -medicines that treat cancer  -steroid medicines like prednisone or cortisone  This list may not describe all possible interactions. Give your health care provider a list of all the medicines, herbs, non-prescription drugs, or dietary supplements you use. Also tell them if you smoke, drink alcohol, or use illegal drugs. Some items may interact with your medicine.  What should I watch for while using this medicine?  Visit your doctor or health care professional for regular checks on your progress. Your doctor or health care professional may order blood tests and other tests to see how you are doing.  Call your doctor or health care professional if you get a cold or other infection while receiving this medicine. Do not treat yourself. This medicine may decrease your body's ability to fight infection.  You should make sure you get enough calcium and vitamin D while you are taking this medicine, unless your doctor tells you not to. Discuss the foods you eat and the vitamins you take with your health care professional.  See your dentist regularly. Brush and floss your teeth as directed. Before you have any dental work done, tell your dentist you are receiving this medicine.  Do   not become pregnant while taking this medicine or for 5 months after stopping it. Women should inform their doctor if they wish to become pregnant or think they might be pregnant. There is a potential for serious side effects to an unborn child. Talk to your health care professional or pharmacist for more information.  What side effects may I notice from receiving this medicine?  Side effects that you should report to your doctor or health care professional as soon as  possible:  -allergic reactions like skin rash, itching or hives, swelling of the face, lips, or tongue  -breathing problems  -chest pain  -fast, irregular heartbeat  -feeling faint or lightheaded, falls  -fever, chills, or any other sign of infection  -muscle spasms, tightening, or twitches  -numbness or tingling  -skin blisters or bumps, or is dry, peels, or red  -slow healing or unexplained pain in the mouth or jaw  -unusual bleeding or bruising  Side effects that usually do not require medical attention (Report these to your doctor or health care professional if they continue or are bothersome.):  -muscle pain  -stomach upset, gas  This list may not describe all possible side effects. Call your doctor for medical advice about side effects. You may report side effects to FDA at 1-800-FDA-1088.  Where should I keep my medicine?  This medicine is only given in a clinic, doctor's office, or other health care setting and will not be stored at home.  NOTE: This sheet is a summary. It may not cover all possible information. If you have questions about this medicine, talk to your doctor, pharmacist, or health care provider.      2016, Elsevier/Gold Standard. (2011-07-02 12:37:47)

## 2016-03-29 ENCOUNTER — Ambulatory Visit (HOSPITAL_BASED_OUTPATIENT_CLINIC_OR_DEPARTMENT_OTHER): Payer: Medicaid Other | Admitting: Internal Medicine

## 2016-03-29 ENCOUNTER — Telehealth: Payer: Self-pay | Admitting: Internal Medicine

## 2016-03-29 ENCOUNTER — Other Ambulatory Visit: Payer: Self-pay | Admitting: Medical Oncology

## 2016-03-29 ENCOUNTER — Other Ambulatory Visit (HOSPITAL_BASED_OUTPATIENT_CLINIC_OR_DEPARTMENT_OTHER): Payer: Medicaid Other

## 2016-03-29 ENCOUNTER — Encounter: Payer: Self-pay | Admitting: Internal Medicine

## 2016-03-29 VITALS — BP 105/58 | HR 105 | Temp 98.0°F | Resp 18 | Ht 59.5 in | Wt 109.4 lb

## 2016-03-29 DIAGNOSIS — C3491 Malignant neoplasm of unspecified part of right bronchus or lung: Secondary | ICD-10-CM

## 2016-03-29 DIAGNOSIS — C7972 Secondary malignant neoplasm of left adrenal gland: Secondary | ICD-10-CM

## 2016-03-29 DIAGNOSIS — Z5111 Encounter for antineoplastic chemotherapy: Secondary | ICD-10-CM

## 2016-03-29 DIAGNOSIS — C342 Malignant neoplasm of middle lobe, bronchus or lung: Secondary | ICD-10-CM

## 2016-03-29 DIAGNOSIS — C7931 Secondary malignant neoplasm of brain: Secondary | ICD-10-CM

## 2016-03-29 DIAGNOSIS — C7951 Secondary malignant neoplasm of bone: Secondary | ICD-10-CM

## 2016-03-29 DIAGNOSIS — E278 Other specified disorders of adrenal gland: Secondary | ICD-10-CM

## 2016-03-29 LAB — CBC WITH DIFFERENTIAL/PLATELET
BASO%: 0.7 % (ref 0.0–2.0)
Basophils Absolute: 0 10*3/uL (ref 0.0–0.1)
EOS ABS: 0.2 10*3/uL (ref 0.0–0.5)
EOS%: 3.6 % (ref 0.0–7.0)
HEMATOCRIT: 42.8 % (ref 34.8–46.6)
HEMOGLOBIN: 14.8 g/dL (ref 11.6–15.9)
LYMPH#: 0.8 10*3/uL — AB (ref 0.9–3.3)
LYMPH%: 13.1 % — ABNORMAL LOW (ref 14.0–49.7)
MCH: 32.4 pg (ref 25.1–34.0)
MCHC: 34.4 g/dL (ref 31.5–36.0)
MCV: 94.2 fL (ref 79.5–101.0)
MONO#: 0.4 10*3/uL (ref 0.1–0.9)
MONO%: 7.4 % (ref 0.0–14.0)
NEUT%: 75.2 % (ref 38.4–76.8)
NEUTROS ABS: 4.4 10*3/uL (ref 1.5–6.5)
Platelets: 390 10*3/uL (ref 145–400)
RBC: 4.55 10*6/uL (ref 3.70–5.45)
RDW: 12.9 % (ref 11.2–14.5)
WBC: 5.9 10*3/uL (ref 3.9–10.3)

## 2016-03-29 LAB — COMPREHENSIVE METABOLIC PANEL
ALBUMIN: 3.9 g/dL (ref 3.5–5.0)
ALK PHOS: 59 U/L (ref 40–150)
ALT: 17 U/L (ref 0–55)
AST: 16 U/L (ref 5–34)
Anion Gap: 9 mEq/L (ref 3–11)
BILIRUBIN TOTAL: 0.81 mg/dL (ref 0.20–1.20)
BUN: 11.5 mg/dL (ref 7.0–26.0)
CALCIUM: 9.3 mg/dL (ref 8.4–10.4)
CO2: 28 mEq/L (ref 22–29)
Chloride: 102 mEq/L (ref 98–109)
Creatinine: 0.7 mg/dL (ref 0.6–1.1)
GLUCOSE: 97 mg/dL (ref 70–140)
Potassium: 4 mEq/L (ref 3.5–5.1)
SODIUM: 139 meq/L (ref 136–145)
TOTAL PROTEIN: 7.2 g/dL (ref 6.4–8.3)

## 2016-03-29 MED FILL — raNITIdine HCL 300 MG TABS: 300 | 30 days supply | Qty: 30 | Fill #1

## 2016-03-29 NOTE — Progress Notes (Signed)
[place] [phone]   [phone]  OFFICE PROGRESS NOTE  [name], MD [place] [address]  DIAGNOSIS: Stage IV (T2a, N1, M1b) non-small cell lung cancer, adenocarcinoma with positive EGFR mutation (deletion 19) presented with right middle lobe lung mass, right hilar adenopathy as well as metastatic disease to the bone, brain and left adrenal diagnosed in July 2016.  PRIOR THERAPY: 1) palliative radiotherapy to the brain as well as metastatic bone lesions in the pelvis. 2) Tarceva 150 mg by mouth daily started on 09/25/2014. Status post 5 months of treatment. 3) status post palliative radiotherapy to the right middle lobe lung mass for treatment of hemoptysis.  CURRENT THERAPY: 1) Tarceva 100 mg by mouth daily started 02/19/2015 status post 12 months of treatment. 2) Xgeva 120 g subcutaneously on monthly basis.  INTERVAL HISTORY: Deborah Foster 46 y.o. female came to the clinic today for follow-up visit accompanied by her interpreter. The patient is feeling fine today with no specific complaints except for intermittent back pain. She denied having any nausea, vomiting, diarrhea or constipation. She has mild skin rash. She has occasional central chest pain but no significant shortness of breath, cough or hemoptysis. She has no weight loss or night sweats. She denied having any headaches but she has some visual changes. MRI of the brain few weeks ago was unremarkable. The patient has repeat CT scan of the chest performed recently and she is here for evaluation and discussion of her scan results.   MEDICAL HISTORY: Past Medical History:  Diagnosis Date  . Bone metastasis ([place])   . Hemoptysis   . Hypokalemia   . lung ca dx'd 07/2014  . Lung mass   . Metastasis to adrenal gland ([place])   . Metastasis to brain ([place])   . Pneumonia   . Radiation 08/23/14-09/07/14   Brain/chest and left hip 30 Gy 12 Fx  . URI (upper  respiratory infection) 02-02-2015    ALLERGIES:  is allergic to other and doxycycline.  MEDICATIONS:  Current Outpatient Prescriptions  Medication Sig Dispense Refill  . benzonatate (TESSALON) 100 MG capsule Take 1 capsule (100 mg total) by mouth every 8 (eight) hours as needed. 20 capsule 0  . chlorpheniramine-HYDROcodone (TUSSIONEX) 10-8 MG/5ML SUER Take 5 mLs by mouth every 12 (twelve) hours as needed. 140 mL 0  . clindamycin (CLINDAGEL) 1 % gel Apply 1 application topically 2 (two) times daily.    . Cyanocobalamin (VITAMIN B-12) 2500 MCG SUBL Place 2,500 mcg under the tongue daily.    Marland Kitchen Dextromethorphan Polistirex (DELSYM PO) Take by mouth.    . ENSURE (ENSURE) Take 1 Can by mouth 2 (two) times daily between meals.     . erlotinib (TARCEVA) 100 MG tablet Take 1 tablet (100 mg total) by mouth daily. Take on an empty stomach 1 hour before meals or 2 hours after 30 tablet 1  . ferrous sulfate 325 (65 FE) MG EC tablet Take 325 mg by mouth daily with breakfast.    . fluticasone (VERAMYST) 27.5 MCG/SPRAY nasal spray Place 2 sprays into the nose daily.    . folic acid (FOLVITE) 892 MCG tablet Take 1 tablet (800 mcg total) by mouth daily. 30 tablet 1  . loratadine (CLARITIN) 10 MG tablet Take 1 tablet (10 mg total) by mouth daily. 30 tablet 0  . Nutritional Supplements (JUICE PLUS FIBRE PO) Take 1 capsule by mouth daily. Reported on 07/28/2015    .  ranitidine (ZANTAC) 300 MG tablet Take 1 tablet (300 mg total) by mouth at bedtime. 30 tablet 6  . TURMERIC PO Take 1 capsule by mouth 2 (two) times daily. Turmeric Superior 750 mg po bid    . Turmeric POWD Take by mouth.     No current facility-administered medications for this visit.     SURGICAL HISTORY:  Past Surgical History:  Procedure Laterality Date  . IR GENERIC HISTORICAL  07/14/2015   IR RADIOLOGIST EVAL & MGMT 07/14/2015 Greggory Keen, MD [facility] INTERV RAD  . VIDEO BRONCHOSCOPY Bilateral 08/09/2014   Procedure: VIDEO BRONCHOSCOPY  WITHOUT FLUORO;  Surgeon: [name], MD;  Location: [facility] ENDOSCOPY;  Service: Cardiopulmonary;  Laterality: Bilateral;  . VIDEO BRONCHOSCOPY Bilateral 07/06/2015   Procedure: VIDEO BRONCHOSCOPY WITHOUT FLUORO;  Surgeon: [name], MD;  Location: [facility] ENDOSCOPY;  Service: Cardiopulmonary;  Laterality: Bilateral;    REVIEW OF SYSTEMS:  Constitutional: negative Eyes: negative Ears, nose, mouth, throat, and face: negative Respiratory: positive for cough and pleurisy/chest pain Cardiovascular: negative Gastrointestinal: negative Genitourinary:negative Integument/breast: negative Hematologic/lymphatic: negative Musculoskeletal:positive for back pain Neurological: negative Behavioral/Psych: negative Endocrine: negative Allergic/Immunologic: negative   PHYSICAL EXAMINATION: General appearance: alert, cooperative and no distress Head: Normocephalic, without obvious abnormality, atraumatic Neck: no adenopathy, no JVD, supple, symmetrical, trachea midline and thyroid not enlarged, symmetric, no tenderness/mass/nodules Lymph nodes: Cervical, supraclavicular, and axillary nodes normal. Resp: clear to auscultation bilaterally Back: symmetric, no curvature. ROM normal. No CVA tenderness. Cardio: regular rate and rhythm, S1, S2 normal, no murmur, click, rub or gallop GI: soft, non-tender; bowel sounds normal; no masses,  no organomegaly Extremities: extremities normal, atraumatic, no cyanosis or edema Neurologic: Alert and oriented X 3, normal strength and tone. Normal symmetric reflexes. Normal coordination and gait  ECOG PERFORMANCE STATUS: 1 - Symptomatic but completely ambulatory  Blood pressure (!) 105/58, pulse (!) 105, temperature 98 F (36.7 C), temperature source Oral, resp. rate 18, height 4' 11.5" (1.511 m), weight 109 lb 6.4 oz (49.6 kg), SpO2 99 %.  LABORATORY DATA: Lab Results  Component Value Date   WBC 5.9 03/29/2016   HGB 14.8 03/29/2016   HCT 42.8 03/29/2016    MCV 94.2 03/29/2016   PLT 390 03/29/2016      Chemistry      Component Value Date/Time   NA 139 03/13/2016 0823   K 3.9 03/13/2016 0823   CL 109 07/19/2015 0509   CO2 25 03/13/2016 0823   BUN 10.8 03/13/2016 0823   CREATININE 0.7 03/13/2016 0823      Component Value Date/Time   CALCIUM 8.9 03/13/2016 0823   ALKPHOS 54 03/13/2016 0823   AST 20 03/13/2016 0823   ALT 19 03/13/2016 0823   BILITOT 0.47 03/13/2016 0823       RADIOGRAPHIC STUDIES: Ct Chest W Contrast  Result Date: 03/02/2016 CLINICAL DATA:  Followup lung cancer. EXAM: CT CHEST WITH CONTRAST TECHNIQUE: Multidetector CT imaging of the chest was performed during intravenous contrast administration. CONTRAST:  20m ISOVUE-300 IOPAMIDOL (ISOVUE-300) INJECTION 61% COMPARISON:  11/22/2015 FINDINGS: Cardiovascular: Normal heart size.  No pericardial effusion. Mediastinum/Nodes: The trachea appears patent and is midline. Unremarkable appearance of the esophagus. There is a right paratracheal lymph node which is enlarging now measuring 7 mm, image 38 of series 2. Previously 3 mm. Lungs/Pleura: There is no pleural fluid. Changes of external beam radiation are I had skin noted within the right midlung including architectural distortion, chronic consolidation and fibrosis. The appearance is unchanged when compared with the previous  exam. The index lesion within this area is not confidently re- measurable. Upper Abdomen: No acute abnormality identified stable low density structure within left lobe of liver measuring 5 mm. Normal appearance of the right adrenal gland. Left adrenal nodule measures 1.2 x 1.9 cm and appears increased in size from prior exam when it measured 0.8 x 1.3 cm. Musculoskeletal: Multifocal areas of sclerotic bone metastases are again identified and appears similar to the previous exam. IMPRESSION: 1. There is a an enlarging right paratracheal lymph node as well as an enlarging left adrenal nodule. Both of these findings  may reflect underlying progression of disease. 2. Stable appearance of extensive changes secondary to external beam radiation within the right lung. 3. Stable multifocal sclerotic bone metastases. Electronically Signed   By: Kerby Moors M.D.   On: 03/02/2016 08:49    ASSESSMENT AND PLAN:  This is a very pleasant 46 years old Asian female with a stage IV non-small cell lung cancer, adenocarcinoma with positive EGFR mutation with deletion in exon 70 diagnosed in July 2016. The patient is currently on treatment with Tarceva 100 mg by mouth daily. She is tolerating her treatment well with no significant adverse effects. She had recent CT scan of the chest. I personally and independently reviewed the scan images and discuss the results with the patient and showed her the images today. The scan showed very mild increase in right paratracheal lymph nodes as well as enlargement of the left adrenal nodule. I discussed with the patient several options for management of her condition including continuous treatment with Tarceva and close monitoring of these lesions versus referral to Dr. Lisbeth Renshaw for consideration of palliative radiotherapy to the left adrenal nodule. The patient would like to continue on the current treatment for now. I will see her back for follow-up visit in one month's for reevaluation with repeat blood work. I would consider repeating her imaging studies in 2 months for reevaluation of these lesions. For the metastatic bone disease she will continue on treatment with Xgeva. The patient would call the clinic if she has any concerning symptoms in the interval. The patient voices understanding of current disease status and treatment options and is in agreement with the current care plan. All questions were answered. The patient knows to call the clinic with any problems, questions or concerns. We can certainly see the patient much sooner if necessary.  Disclaimer: This note was dictated with  voice recognition software. Similar sounding words can inadvertently be transcribed and may not be corrected upon review.

## 2016-03-29 NOTE — Telephone Encounter (Signed)
Gave patient AVS and calender per 03/29/2016 los

## 2016-04-10 ENCOUNTER — Ambulatory Visit: Payer: Medicaid Other

## 2016-04-10 ENCOUNTER — Encounter (HOSPITAL_BASED_OUTPATIENT_CLINIC_OR_DEPARTMENT_OTHER): Payer: Medicaid Other

## 2016-04-10 ENCOUNTER — Other Ambulatory Visit (HOSPITAL_BASED_OUTPATIENT_CLINIC_OR_DEPARTMENT_OTHER): Payer: Medicaid Other

## 2016-04-10 DIAGNOSIS — E889 Metabolic disorder, unspecified: Secondary | ICD-10-CM

## 2016-04-10 DIAGNOSIS — M898X9 Other specified disorders of bone, unspecified site: Secondary | ICD-10-CM

## 2016-04-10 DIAGNOSIS — M9080 Osteopathy in diseases classified elsewhere, unspecified site: Secondary | ICD-10-CM

## 2016-04-10 DIAGNOSIS — C3491 Malignant neoplasm of unspecified part of right bronchus or lung: Secondary | ICD-10-CM

## 2016-04-10 DIAGNOSIS — C342 Malignant neoplasm of middle lobe, bronchus or lung: Secondary | ICD-10-CM | POA: Diagnosis present

## 2016-04-10 DIAGNOSIS — C7951 Secondary malignant neoplasm of bone: Secondary | ICD-10-CM

## 2016-04-10 DIAGNOSIS — C3431 Malignant neoplasm of lower lobe, right bronchus or lung: Secondary | ICD-10-CM

## 2016-04-10 LAB — COMPREHENSIVE METABOLIC PANEL
ALBUMIN: 3.8 g/dL (ref 3.5–5.0)
ALK PHOS: 50 U/L (ref 40–150)
ALT: 17 U/L (ref 0–55)
AST: 17 U/L (ref 5–34)
Anion Gap: 10 mEq/L (ref 3–11)
BUN: 10.3 mg/dL (ref 7.0–26.0)
CALCIUM: 8.7 mg/dL (ref 8.4–10.4)
CHLORIDE: 106 meq/L (ref 98–109)
CO2: 25 mEq/L (ref 22–29)
Creatinine: 0.6 mg/dL (ref 0.6–1.1)
EGFR: >90 mL/min/{1.73_m2} (ref >90)
Glucose: 70 mg/dl (ref 70–140)
POTASSIUM: 3.7 meq/L (ref 3.5–5.1)
SODIUM: 140 meq/L (ref 136–145)
Total Bilirubin: 0.58 mg/dL (ref 0.20–1.20)
Total Protein: 7 g/dL (ref 6.4–8.3)

## 2016-04-10 LAB — CBC WITH DIFFERENTIAL/PLATELET
BASO%: 0.7 % (ref 0.0–2.0)
BASOS ABS: 0 10*3/uL (ref 0.0–0.1)
EOS ABS: 0.2 10*3/uL (ref 0.0–0.5)
EOS%: 3 % (ref 0.0–7.0)
HEMATOCRIT: 43.6 % (ref 34.8–46.6)
HGB: 14.9 g/dL (ref 11.6–15.9)
LYMPH%: 18.4 % (ref 14.0–49.7)
MCH: 32.5 pg (ref 25.1–34.0)
MCHC: 34.2 g/dL (ref 31.5–36.0)
MCV: 94.8 fL (ref 79.5–101.0)
MONO#: 0.4 10*3/uL (ref 0.1–0.9)
MONO%: 8 % (ref 0.0–14.0)
NEUT#: 3.7 10*3/uL (ref 1.5–6.5)
NEUT%: 69.9 % (ref 38.4–76.8)
Platelets: 351 10*3/uL (ref 145–400)
RBC: 4.6 10*6/uL (ref 3.70–5.45)
RDW: 12.8 % (ref 11.2–14.5)
WBC: 5.3 10*3/uL (ref 3.9–10.3)
lymph#: 1 10*3/uL (ref 0.9–3.3)

## 2016-04-10 MED ORDER — DENOSUMAB 120 MG/1.7ML ~~LOC~~ SOLN
120.0000 mg | Freq: Once | SUBCUTANEOUS | Status: AC
  Administered 2016-04-10: 120 mg via SUBCUTANEOUS
  Filled 2016-04-10: qty 1.7

## 2016-04-10 NOTE — Patient Instructions (Signed)
Denosumab injection  What is this medicine?  DENOSUMAB (den oh sue mab) slows bone breakdown. Prolia is used to treat osteoporosis in women after menopause and in men. Xgeva is used to prevent bone fractures and other bone problems caused by cancer bone metastases. Xgeva is also used to treat giant cell tumor of the bone.  This medicine may be used for other purposes; ask your health care provider or pharmacist if you have questions.  What should I tell my health care provider before I take this medicine?  They need to know if you have any of these conditions:  -dental disease  -eczema  -infection or history of infections  -kidney disease or on dialysis  -low blood calcium or vitamin D  -malabsorption syndrome  -scheduled to have surgery or tooth extraction  -taking medicine that contains denosumab  -thyroid or parathyroid disease  -an unusual reaction to denosumab, other medicines, foods, dyes, or preservatives  -pregnant or trying to get pregnant  -breast-feeding  How should I use this medicine?  This medicine is for injection under the skin. It is given by a health care professional in a hospital or clinic setting.  If you are getting Prolia, a special MedGuide will be given to you by the pharmacist with each prescription and refill. Be sure to read this information carefully each time.  For Prolia, talk to your pediatrician regarding the use of this medicine in children. Special care may be needed. For Xgeva, talk to your pediatrician regarding the use of this medicine in children. While this drug may be prescribed for children as young as 13 years for selected conditions, precautions do apply.  Overdosage: If you think you have taken too much of this medicine contact a poison control center or emergency room at once.  NOTE: This medicine is only for you. Do not share this medicine with others.  What if I miss a dose?  It is important not to miss your dose. Call your doctor or health care professional if you are  unable to keep an appointment.  What may interact with this medicine?  Do not take this medicine with any of the following medications:  -other medicines containing denosumab  This medicine may also interact with the following medications:  -medicines that suppress the immune system  -medicines that treat cancer  -steroid medicines like prednisone or cortisone  This list may not describe all possible interactions. Give your health care provider a list of all the medicines, herbs, non-prescription drugs, or dietary supplements you use. Also tell them if you smoke, drink alcohol, or use illegal drugs. Some items may interact with your medicine.  What should I watch for while using this medicine?  Visit your doctor or health care professional for regular checks on your progress. Your doctor or health care professional may order blood tests and other tests to see how you are doing.  Call your doctor or health care professional if you get a cold or other infection while receiving this medicine. Do not treat yourself. This medicine may decrease your body's ability to fight infection.  You should make sure you get enough calcium and vitamin D while you are taking this medicine, unless your doctor tells you not to. Discuss the foods you eat and the vitamins you take with your health care professional.  See your dentist regularly. Brush and floss your teeth as directed. Before you have any dental work done, tell your dentist you are receiving this medicine.  Do   not become pregnant while taking this medicine or for 5 months after stopping it. Women should inform their doctor if they wish to become pregnant or think they might be pregnant. There is a potential for serious side effects to an unborn child. Talk to your health care professional or pharmacist for more information.  What side effects may I notice from receiving this medicine?  Side effects that you should report to your doctor or health care professional as soon as  possible:  -allergic reactions like skin rash, itching or hives, swelling of the face, lips, or tongue  -breathing problems  -chest pain  -fast, irregular heartbeat  -feeling faint or lightheaded, falls  -fever, chills, or any other sign of infection  -muscle spasms, tightening, or twitches  -numbness or tingling  -skin blisters or bumps, or is dry, peels, or red  -slow healing or unexplained pain in the mouth or jaw  -unusual bleeding or bruising  Side effects that usually do not require medical attention (Report these to your doctor or health care professional if they continue or are bothersome.):  -muscle pain  -stomach upset, gas  This list may not describe all possible side effects. Call your doctor for medical advice about side effects. You may report side effects to FDA at 1-800-FDA-1088.  Where should I keep my medicine?  This medicine is only given in a clinic, doctor's office, or other health care setting and will not be stored at home.  NOTE: This sheet is a summary. It may not cover all possible information. If you have questions about this medicine, talk to your doctor, pharmacist, or health care provider.      2016, Elsevier/Gold Standard. (2011-07-02 12:37:47)

## 2016-04-16 ENCOUNTER — Other Ambulatory Visit: Payer: Self-pay | Admitting: *Deleted

## 2016-04-16 DIAGNOSIS — C7931 Secondary malignant neoplasm of brain: Secondary | ICD-10-CM

## 2016-04-30 ENCOUNTER — Other Ambulatory Visit (HOSPITAL_BASED_OUTPATIENT_CLINIC_OR_DEPARTMENT_OTHER): Payer: Medicaid Other

## 2016-04-30 ENCOUNTER — Telehealth: Payer: Self-pay | Admitting: Internal Medicine

## 2016-04-30 ENCOUNTER — Encounter: Payer: Self-pay | Admitting: Internal Medicine

## 2016-04-30 ENCOUNTER — Ambulatory Visit (HOSPITAL_BASED_OUTPATIENT_CLINIC_OR_DEPARTMENT_OTHER): Payer: Medicaid Other | Admitting: Internal Medicine

## 2016-04-30 VITALS — BP 114/63 | HR 117 | Temp 98.1°F | Resp 17 | Ht 59.0 in | Wt 107.8 lb

## 2016-04-30 DIAGNOSIS — C7971 Secondary malignant neoplasm of right adrenal gland: Secondary | ICD-10-CM

## 2016-04-30 DIAGNOSIS — C7951 Secondary malignant neoplasm of bone: Secondary | ICD-10-CM

## 2016-04-30 DIAGNOSIS — C3491 Malignant neoplasm of unspecified part of right bronchus or lung: Secondary | ICD-10-CM

## 2016-04-30 DIAGNOSIS — C7972 Secondary malignant neoplasm of left adrenal gland: Secondary | ICD-10-CM

## 2016-04-30 DIAGNOSIS — Z5111 Encounter for antineoplastic chemotherapy: Secondary | ICD-10-CM

## 2016-04-30 DIAGNOSIS — C342 Malignant neoplasm of middle lobe, bronchus or lung: Secondary | ICD-10-CM

## 2016-04-30 DIAGNOSIS — C7931 Secondary malignant neoplasm of brain: Secondary | ICD-10-CM

## 2016-04-30 DIAGNOSIS — E278 Other specified disorders of adrenal gland: Secondary | ICD-10-CM

## 2016-04-30 LAB — CBC WITH DIFFERENTIAL/PLATELET
BASO%: 0.5 % (ref 0.0–2.0)
Basophils Absolute: 0 10*3/uL (ref 0.0–0.1)
EOS ABS: 0.2 10*3/uL (ref 0.0–0.5)
EOS%: 2.3 % (ref 0.0–7.0)
HCT: 44 % (ref 34.8–46.6)
HEMOGLOBIN: 14.6 g/dL (ref 11.6–15.9)
LYMPH#: 0.8 10*3/uL — AB (ref 0.9–3.3)
LYMPH%: 12.1 % — AB (ref 14.0–49.7)
MCH: 31.5 pg (ref 25.1–34.0)
MCHC: 33.2 g/dL (ref 31.5–36.0)
MCV: 95 fL (ref 79.5–101.0)
MONO#: 0.8 10*3/uL (ref 0.1–0.9)
MONO%: 12.6 % (ref 0.0–14.0)
NEUT%: 72.5 % (ref 38.4–76.8)
NEUTROS ABS: 4.7 10*3/uL (ref 1.5–6.5)
PLATELETS: 359 10*3/uL (ref 145–400)
RBC: 4.63 10*6/uL (ref 3.70–5.45)
RDW: 12.5 % (ref 11.2–14.5)
WBC: 6.4 10*3/uL (ref 3.9–10.3)

## 2016-04-30 LAB — COMPREHENSIVE METABOLIC PANEL
ALK PHOS: 54 U/L (ref 40–150)
ALT: 21 U/L (ref 0–55)
ANION GAP: 11 meq/L (ref 3–11)
AST: 19 U/L (ref 5–34)
Albumin: 3.9 g/dL (ref 3.5–5.0)
BILIRUBIN TOTAL: 0.87 mg/dL (ref 0.20–1.20)
BUN: 11.8 mg/dL (ref 7.0–26.0)
CO2: 27 meq/L (ref 22–29)
CREATININE: 0.7 mg/dL (ref 0.6–1.1)
Calcium: 9.5 mg/dL (ref 8.4–10.4)
Chloride: 103 mEq/L (ref 98–109)
EGFR: >90 mL/min/{1.73_m2} (ref >90)
Glucose: 108 mg/dl (ref 70–140)
Potassium: 4 mEq/L (ref 3.5–5.1)
Sodium: 141 mEq/L (ref 136–145)
Total Protein: 7.4 g/dL (ref 6.4–8.3)

## 2016-04-30 MED ORDER — LORATADINE 10 MG PO TABS: 10.0000 mg | ORAL_TABLET | Freq: Every day | ORAL | 0 refills | Status: AC

## 2016-04-30 MED FILL — raNITIdine HCL 300 MG TABS: 300 | 30 days supply | Qty: 30 | Fill #2

## 2016-04-30 NOTE — Progress Notes (Signed)
[place] [phone]   Fax:([phone]) [phone]  OFFICE PROGRESS NOTE  [name], MD [place] [address]  DIAGNOSIS: Stage IV (T2a, N1, M1b) non-small cell lung cancer, adenocarcinoma with positive EGFR mutation (deletion 19) presented with right middle lobe lung mass, right hilar adenopathy as well as metastatic disease to the bone, brain and left adrenal diagnosed in July 2016.  PRIOR THERAPY: 1) palliative radiotherapy to the brain as well as metastatic bone lesions in the pelvis. 2) Tarceva 150 mg by mouth daily started on 09/25/2014. Status post 5 months of treatment. 3) status post palliative radiotherapy to the right middle lobe lung mass for treatment of hemoptysis.  CURRENT THERAPY: 1) Tarceva 100 mg by mouth daily started 02/19/2015 status post 13 months of treatment. 2) Xgeva 120 g subcutaneously on monthly basis.  INTERVAL HISTORY: Deborah Foster 46 y.o. female returns to the clinic today for follow-up visit accompanied by her interpreter. The patient is feeling fine today with no specific complaints except for occasional headache started a week ago in addition to right shoulder pain with radiation to the right arm. She denied having any nausea or vomiting. She continues to have mild skin rash with no significant diarrhea. She is tolerating her treatment with Tarceva fairly well. She was supposed to have repeat MRI of the brain on 05/18/2016. The patient denied having any chest pain, shortness of breath, cough or hemoptysis. She is here today for reevaluation with repeat blood work.   MEDICAL HISTORY: Past Medical History:  Diagnosis Date  . Bone metastasis ([place])   . Hemoptysis   . Hypokalemia   . lung ca dx'd 07/2014  . Lung mass   . Metastasis to adrenal gland ([place])   . Metastasis to brain ([place])   . Pneumonia   . Radiation 08/23/14-09/07/14   Brain/chest and left hip 30 Gy 12 Fx  . URI (upper respiratory  infection) 02-02-2015    ALLERGIES:  is allergic to other and doxycycline.  MEDICATIONS:  Current Outpatient Prescriptions  Medication Sig Dispense Refill  . benzonatate (TESSALON) 100 MG capsule Take 1 capsule (100 mg total) by mouth every 8 (eight) hours as needed. 20 capsule 0  . chlorpheniramine-HYDROcodone (TUSSIONEX) 10-8 MG/5ML SUER Take 5 mLs by mouth every 12 (twelve) hours as needed. 140 mL 0  . clindamycin (CLINDAGEL) 1 % gel Apply 1 application topically 2 (two) times daily.    . Cyanocobalamin (VITAMIN B-12) 2500 MCG SUBL Place 2,500 mcg under the tongue daily.    Marland Kitchen Dextromethorphan Polistirex (DELSYM PO) Take by mouth.    . ENSURE (ENSURE) Take 1 Can by mouth 2 (two) times daily between meals.     . erlotinib (TARCEVA) 100 MG tablet Take 1 tablet (100 mg total) by mouth daily. Take on an empty stomach 1 hour before meals or 2 hours after 30 tablet 1  . ferrous sulfate 325 (65 FE) MG EC tablet Take 325 mg by mouth daily with breakfast.    . fluticasone (VERAMYST) 27.5 MCG/SPRAY nasal spray Place 2 sprays into the nose daily.    . folic acid (FOLVITE) 371 MCG tablet Take 1 tablet (800 mcg total) by mouth daily. 30 tablet 1  . loratadine (CLARITIN) 10 MG tablet Take 1 tablet (10 mg total) by mouth daily. 30 tablet 0  . Nutritional Supplements (JUICE PLUS FIBRE PO) Take 1 capsule by mouth daily. Reported on 07/28/2015    . ranitidine (ZANTAC) 300  MG tablet Take 1 tablet (300 mg total) by mouth at bedtime. 30 tablet 6  . TURMERIC PO Take 1 capsule by mouth 2 (two) times daily. Turmeric Superior 750 mg po bid     No current facility-administered medications for this visit.     SURGICAL HISTORY:  Past Surgical History:  Procedure Laterality Date  . IR GENERIC HISTORICAL  07/14/2015   IR RADIOLOGIST EVAL & MGMT 07/14/2015 Greggory Keen, MD [facility] INTERV RAD  . VIDEO BRONCHOSCOPY Bilateral 08/09/2014   Procedure: VIDEO BRONCHOSCOPY WITHOUT FLUORO;  Surgeon: [name], MD;   Location: [facility] ENDOSCOPY;  Service: Cardiopulmonary;  Laterality: Bilateral;  . VIDEO BRONCHOSCOPY Bilateral 07/06/2015   Procedure: VIDEO BRONCHOSCOPY WITHOUT FLUORO;  Surgeon: [name], MD;  Location: [facility] ENDOSCOPY;  Service: Cardiopulmonary;  Laterality: Bilateral;    REVIEW OF SYSTEMS:  A comprehensive review of systems was negative except for: Constitutional: positive for fatigue Musculoskeletal: positive for neck pain Neurological: positive for headaches   PHYSICAL EXAMINATION: General appearance: alert, cooperative and no distress Head: Normocephalic, without obvious abnormality, atraumatic Neck: no adenopathy, no JVD, supple, symmetrical, trachea midline and thyroid not enlarged, symmetric, no tenderness/mass/nodules Lymph nodes: Cervical, supraclavicular, and axillary nodes normal. Resp: clear to auscultation bilaterally Back: symmetric, no curvature. ROM normal. No CVA tenderness. Cardio: regular rate and rhythm, S1, S2 normal, no murmur, click, rub or gallop GI: soft, non-tender; bowel sounds normal; no masses,  no organomegaly Extremities: extremities normal, atraumatic, no cyanosis or edema  ECOG PERFORMANCE STATUS: 1 - Symptomatic but completely ambulatory  Blood pressure 114/63, pulse (!) 117, temperature 98.1 F (36.7 C), temperature source Oral, resp. rate 17, height '4\' 11"'$  (1.499 m), weight 107 lb 12.8 oz (48.9 kg), SpO2 99 %.  LABORATORY DATA: Lab Results  Component Value Date   WBC 6.4 04/30/2016   HGB 14.6 04/30/2016   HCT 44.0 04/30/2016   MCV 95.0 04/30/2016   PLT 359 04/30/2016      Chemistry      Component Value Date/Time   NA 141 04/30/2016 0904   K 4.0 04/30/2016 0904   CL 109 07/19/2015 0509   CO2 27 04/30/2016 0904   BUN 11.8 04/30/2016 0904   CREATININE 0.7 04/30/2016 0904      Component Value Date/Time   CALCIUM 9.5 04/30/2016 0904   ALKPHOS 54 04/30/2016 0904   AST 19 04/30/2016 0904   ALT 21 04/30/2016 0904   BILITOT 0.87  04/30/2016 0904       RADIOGRAPHIC STUDIES: No results found.  ASSESSMENT AND PLAN:  This is a very pleasant 46 years old Asian female with a stage IV non-small cell lung cancer, adenocarcinoma with positive EGFR mutation in the 19 mutation diagnosed in July 2016. The patient is currently undergoing treatment with Tarceva 100 mg by mouth daily and has been tolerating her treatment fairly well. She has few complaints today including headache for 1 week in addition to pain in the right shoulder area with radiation to the right arm. These findings could be concerning for disease progression. I recommended for the patient to have repeat CT scan of the chest, abdomen and pelvis in few weeks. She is also scheduled to have MRI of the brain early next month. I will see her back for follow-up visit at that time for reevaluation and the patient has any evidence for disease progression, we will discontinue her treatment with Tarceva and consider the patient for treatment with Tagrisso if she has T790M resistant mutation. I gave  her refill of Claritin today. She was advised to call immediately if she has any concerning symptoms in the interval. For the metastatic bone disease, the patient will continue her treatment with Xgeva. The patient voices understanding of current disease status and treatment options and is in agreement with the current care plan. All questions were answered. The patient knows to call the clinic with any problems, questions or concerns. We can certainly see the patient much sooner if necessary. I spent 10 minutes counseling the patient face to face. The total time spent in the appointment was 15 minutes. Disclaimer: This note was dictated with voice recognition software. Similar sounding words can inadvertently be transcribed and may not be corrected upon review.

## 2016-04-30 NOTE — Telephone Encounter (Signed)
Appointments scheduled per 4.16.18 LOS. Patient given AVS report and calendars with future scheduled appointments. Patient requested to have appointment scheduled for 5.17 instead of 5.16.

## 2016-05-01 ENCOUNTER — Other Ambulatory Visit: Payer: Self-pay

## 2016-05-08 ENCOUNTER — Other Ambulatory Visit (HOSPITAL_BASED_OUTPATIENT_CLINIC_OR_DEPARTMENT_OTHER): Payer: Medicaid Other

## 2016-05-08 ENCOUNTER — Ambulatory Visit (HOSPITAL_BASED_OUTPATIENT_CLINIC_OR_DEPARTMENT_OTHER): Payer: Medicaid Other

## 2016-05-08 DIAGNOSIS — C7951 Secondary malignant neoplasm of bone: Secondary | ICD-10-CM | POA: Diagnosis present

## 2016-05-08 DIAGNOSIS — C3491 Malignant neoplasm of unspecified part of right bronchus or lung: Secondary | ICD-10-CM

## 2016-05-08 DIAGNOSIS — C342 Malignant neoplasm of middle lobe, bronchus or lung: Secondary | ICD-10-CM

## 2016-05-08 DIAGNOSIS — M9080 Osteopathy in diseases classified elsewhere, unspecified site: Secondary | ICD-10-CM

## 2016-05-08 DIAGNOSIS — M898X9 Other specified disorders of bone, unspecified site: Secondary | ICD-10-CM

## 2016-05-08 DIAGNOSIS — E889 Metabolic disorder, unspecified: Secondary | ICD-10-CM

## 2016-05-08 DIAGNOSIS — C3431 Malignant neoplasm of lower lobe, right bronchus or lung: Secondary | ICD-10-CM

## 2016-05-08 LAB — CBC WITH DIFFERENTIAL/PLATELET
BASO%: 0.7 % (ref 0.0–2.0)
Basophils Absolute: 0 10*3/uL (ref 0.0–0.1)
EOS%: 1.9 % (ref 0.0–7.0)
Eosinophils Absolute: 0.1 10*3/uL (ref 0.0–0.5)
HCT: 43.1 % (ref 34.8–46.6)
HGB: 14.9 g/dL (ref 11.6–15.9)
LYMPH#: 1 10*3/uL (ref 0.9–3.3)
LYMPH%: 14.4 % (ref 14.0–49.7)
MCH: 32 pg (ref 25.1–34.0)
MCHC: 34.5 g/dL (ref 31.5–36.0)
MCV: 92.9 fL (ref 79.5–101.0)
MONO#: 0.5 10*3/uL (ref 0.1–0.9)
MONO%: 7.4 % (ref 0.0–14.0)
NEUT#: 5.5 10*3/uL (ref 1.5–6.5)
NEUT%: 75.6 % (ref 38.4–76.8)
Platelets: 363 10*3/uL (ref 145–400)
RBC: 4.65 10*6/uL (ref 3.70–5.45)
RDW: 12.7 % (ref 11.2–14.5)
WBC: 7.3 10*3/uL (ref 3.9–10.3)

## 2016-05-08 LAB — COMPREHENSIVE METABOLIC PANEL
ALT: 16 U/L (ref 0–55)
AST: 16 U/L (ref 5–34)
Albumin: 3.8 g/dL (ref 3.5–5.0)
Alkaline Phosphatase: 52 U/L (ref 40–150)
Anion Gap: 11 mEq/L (ref 3–11)
BUN: 12.6 mg/dL (ref 7.0–26.0)
CHLORIDE: 104 meq/L (ref 98–109)
CO2: 25 meq/L (ref 22–29)
CREATININE: 0.7 mg/dL (ref 0.6–1.1)
Calcium: 9 mg/dL (ref 8.4–10.4)
EGFR: >90 mL/min/{1.73_m2} (ref >90)
Glucose: 82 mg/dl (ref 70–140)
Potassium: 3.8 mEq/L (ref 3.5–5.1)
SODIUM: 140 meq/L (ref 136–145)
Total Bilirubin: 0.68 mg/dL (ref 0.20–1.20)
Total Protein: 7.2 g/dL (ref 6.4–8.3)

## 2016-05-08 MED ORDER — DENOSUMAB 120 MG/1.7ML ~~LOC~~ SOLN
120.0000 mg | Freq: Once | SUBCUTANEOUS | Status: AC
  Administered 2016-05-08: 120 mg via SUBCUTANEOUS
  Filled 2016-05-08: qty 1.7

## 2016-05-08 NOTE — Patient Instructions (Signed)
Denosumab injection  What is this medicine?  DENOSUMAB (den oh sue mab) slows bone breakdown. Prolia is used to treat osteoporosis in women after menopause and in men. Xgeva is used to prevent bone fractures and other bone problems caused by cancer bone metastases. Xgeva is also used to treat giant cell tumor of the bone.  This medicine may be used for other purposes; ask your health care provider or pharmacist if you have questions.  What should I tell my health care provider before I take this medicine?  They need to know if you have any of these conditions:  -dental disease  -eczema  -infection or history of infections  -kidney disease or on dialysis  -low blood calcium or vitamin D  -malabsorption syndrome  -scheduled to have surgery or tooth extraction  -taking medicine that contains denosumab  -thyroid or parathyroid disease  -an unusual reaction to denosumab, other medicines, foods, dyes, or preservatives  -pregnant or trying to get pregnant  -breast-feeding  How should I use this medicine?  This medicine is for injection under the skin. It is given by a health care professional in a hospital or clinic setting.  If you are getting Prolia, a special MedGuide will be given to you by the pharmacist with each prescription and refill. Be sure to read this information carefully each time.  For Prolia, talk to your pediatrician regarding the use of this medicine in children. Special care may be needed. For Xgeva, talk to your pediatrician regarding the use of this medicine in children. While this drug may be prescribed for children as young as 13 years for selected conditions, precautions do apply.  Overdosage: If you think you have taken too much of this medicine contact a poison control center or emergency room at once.  NOTE: This medicine is only for you. Do not share this medicine with others.  What if I miss a dose?  It is important not to miss your dose. Call your doctor or health care professional if you are  unable to keep an appointment.  What may interact with this medicine?  Do not take this medicine with any of the following medications:  -other medicines containing denosumab  This medicine may also interact with the following medications:  -medicines that suppress the immune system  -medicines that treat cancer  -steroid medicines like prednisone or cortisone  This list may not describe all possible interactions. Give your health care provider a list of all the medicines, herbs, non-prescription drugs, or dietary supplements you use. Also tell them if you smoke, drink alcohol, or use illegal drugs. Some items may interact with your medicine.  What should I watch for while using this medicine?  Visit your doctor or health care professional for regular checks on your progress. Your doctor or health care professional may order blood tests and other tests to see how you are doing.  Call your doctor or health care professional if you get a cold or other infection while receiving this medicine. Do not treat yourself. This medicine may decrease your body's ability to fight infection.  You should make sure you get enough calcium and vitamin D while you are taking this medicine, unless your doctor tells you not to. Discuss the foods you eat and the vitamins you take with your health care professional.  See your dentist regularly. Brush and floss your teeth as directed. Before you have any dental work done, tell your dentist you are receiving this medicine.  Do   not become pregnant while taking this medicine or for 5 months after stopping it. Women should inform their doctor if they wish to become pregnant or think they might be pregnant. There is a potential for serious side effects to an unborn child. Talk to your health care professional or pharmacist for more information.  What side effects may I notice from receiving this medicine?  Side effects that you should report to your doctor or health care professional as soon as  possible:  -allergic reactions like skin rash, itching or hives, swelling of the face, lips, or tongue  -breathing problems  -chest pain  -fast, irregular heartbeat  -feeling faint or lightheaded, falls  -fever, chills, or any other sign of infection  -muscle spasms, tightening, or twitches  -numbness or tingling  -skin blisters or bumps, or is dry, peels, or red  -slow healing or unexplained pain in the mouth or jaw  -unusual bleeding or bruising  Side effects that usually do not require medical attention (Report these to your doctor or health care professional if they continue or are bothersome.):  -muscle pain  -stomach upset, gas  This list may not describe all possible side effects. Call your doctor for medical advice about side effects. You may report side effects to FDA at 1-800-FDA-1088.  Where should I keep my medicine?  This medicine is only given in a clinic, doctor's office, or other health care setting and will not be stored at home.  NOTE: This sheet is a summary. It may not cover all possible information. If you have questions about this medicine, talk to your doctor, pharmacist, or health care provider.      2016, Elsevier/Gold Standard. (2011-07-02 12:37:47)

## 2016-05-18 ENCOUNTER — Ambulatory Visit
Admission: RE | Admit: 2016-05-18 | Discharge: 2016-05-18 | Disposition: A | Discharge status: Home / Self Care | Payer: Medicaid Other | Source: Ambulatory Visit | Attending: Radiation Oncology | Admitting: Radiation Oncology

## 2016-05-18 DIAGNOSIS — C7931 Secondary malignant neoplasm of brain: Secondary | ICD-10-CM

## 2016-05-18 MED ORDER — GADOBENATE DIMEGLUMINE 529 MG/ML IV SOLN
9.0000 mL | Freq: Once | INTRAVENOUS | Status: AC | PRN
  Administered 2016-05-18: 9 mL via INTRAVENOUS

## 2016-05-21 ENCOUNTER — Ambulatory Visit
Admission: RE | Admit: 2016-05-21 | Discharge: 2016-05-21 | Disposition: A | Discharge status: Home / Self Care | Payer: Medicaid Other | Source: Ambulatory Visit | Attending: Radiation Oncology | Admitting: Radiation Oncology

## 2016-05-21 ENCOUNTER — Encounter: Payer: Self-pay | Admitting: Radiation Oncology

## 2016-05-21 ENCOUNTER — Telehealth: Payer: Self-pay | Admitting: Internal Medicine

## 2016-05-21 VITALS — BP 95/61 | HR 102 | Temp 97.6°F | Resp 16 | Ht 59.0 in | Wt 109.4 lb

## 2016-05-21 DIAGNOSIS — Z85830 Personal history of malignant neoplasm of bone: Secondary | ICD-10-CM | POA: Insufficient documentation

## 2016-05-21 DIAGNOSIS — E876 Hypokalemia: Secondary | ICD-10-CM | POA: Diagnosis not present

## 2016-05-21 DIAGNOSIS — Z85118 Personal history of other malignant neoplasm of bronchus and lung: Secondary | ICD-10-CM | POA: Insufficient documentation

## 2016-05-21 DIAGNOSIS — Z08 Encounter for follow-up examination after completed treatment for malignant neoplasm: Secondary | ICD-10-CM | POA: Diagnosis present

## 2016-05-21 DIAGNOSIS — Z923 Personal history of irradiation: Secondary | ICD-10-CM | POA: Insufficient documentation

## 2016-05-21 DIAGNOSIS — Z85841 Personal history of malignant neoplasm of brain: Secondary | ICD-10-CM | POA: Diagnosis not present

## 2016-05-21 DIAGNOSIS — C7931 Secondary malignant neoplasm of brain: Secondary | ICD-10-CM

## 2016-05-21 NOTE — Telephone Encounter (Signed)
Called central radiology schedulers to schedule 05/28/16 CT scan appointment. Was assisted by Vickii Chafe. Told the patient that the first bottle of contrast would be taken at 6:30AM and the second bottle of contrast at 7:30AM. Patient stated that she already has two bottles of contrast at home along with instructions for the CT scan appointments.

## 2016-05-21 NOTE — Progress Notes (Signed)
Radiation Oncology         [phone] ________________________________  Name: Deborah Foster MRN: [id number]  Date: 05/21/2016  DOB: Dec 23, 1970  Follow up Note  CC: [name], MD  [name], *  Diagnosis:   Stage IV T2a, N1, M1b NSCLC, adenocarcinoma of the right middle lobe with metastatic disease to bone and brain.  Interval Since Last Radiation:  22 months   07/21/2015 to 08/03/2015: The Right hilar tumor was treated to 30 Gy in 10 fractions at 3 Gy per fraction.   08/23/14-09/07/14: 30 Gy in 12 fractions was prescribed to the right lung, whole brain, and left hip.  Narrative:  The patient returns today for routine follow-up. Of note her history includes stage IV lung cancer which was identified in the summer of 2016. She received whole brain and right lung radiotherapy along the left hip treatment in August 2016. She's been followed in surveillance since, she recurred locally in the chest, and received radiotherapy in the summer of 2017. She comes today for follow-up of her CNS. Her most recent MRI of the brain on 05/18/16 revealed no evidence of new disease, and her treated lesions in the brain are stable. She's again due for repeat systemic imaging with Dr. Julien Nordmann next week. She continues on Tarceva. She comes today with interpretor services to review her brain MRI.  On review of systems, the patient reports that she is doing well overall. She denies any chest pain, shortness of breath, cough, fevers, chills, night sweats, unintended weight changes. She denies any bowel or bladder disturbances, and denies abdominal pain, nausea or vomiting. She denies any headaches, dizziness, visual, or auditory changes. She denies any new musculoskeletal or joint aches or pains, new skin lesions or concerns. A complete review of systems is obtained and is otherwise negative.  Past Medical History:  Past Medical History:  Diagnosis Date  . Bone metastasis ([place])   . Hemoptysis   .  Hypokalemia   . lung ca dx'd 07/2014  . Lung mass   . Metastasis to adrenal gland ([place])   . Metastasis to brain ([place])   . Pneumonia   . Radiation 08/23/14-09/07/14   Brain/chest and left hip 30 Gy 12 Fx  . URI (upper respiratory infection) 02-02-2015    Past Surgical History: Past Surgical History:  Procedure Laterality Date  . IR GENERIC HISTORICAL  07/14/2015   IR RADIOLOGIST EVAL & MGMT 07/14/2015 Greggory Keen, MD [facility] INTERV RAD  . VIDEO BRONCHOSCOPY Bilateral 08/09/2014   Procedure: VIDEO BRONCHOSCOPY WITHOUT FLUORO;  Surgeon: [name], MD;  Location: [facility] ENDOSCOPY;  Service: Cardiopulmonary;  Laterality: Bilateral;  . VIDEO BRONCHOSCOPY Bilateral 07/06/2015   Procedure: VIDEO BRONCHOSCOPY WITHOUT FLUORO;  Surgeon: [name], MD;  Location: [facility] ENDOSCOPY;  Service: Cardiopulmonary;  Laterality: Bilateral;    Social History:  Social History   Social History  . Marital status: Married    Spouse name: N/A  . Number of children: 3  . Years of education: N/A   Occupational History  . unemployed    Social History Main Topics  . Smoking status: Never Smoker  . Smokeless tobacco: Never Used  . Alcohol use No  . Drug use: No  . Sexual activity: Not Currently   Other Topics Concern  . Not on file   Social History Narrative   Patient moved to [place] in [place].   Had been in [place] since that time.    Family History: Family  History  Problem Relation Age of Onset  . Hyperlipidemia Mother     ALLERGIES:  is allergic to other and doxycycline.  Meds: Current Outpatient Prescriptions  Medication Sig Dispense Refill  . benzonatate (TESSALON) 100 MG capsule Take 1 capsule (100 mg total) by mouth every 8 (eight) hours as needed. 20 capsule 0  . chlorpheniramine-HYDROcodone (TUSSIONEX) 10-8 MG/5ML SUER Take 5 mLs by mouth every 12 (twelve) hours as needed. 140 mL 0  . clindamycin (CLINDAGEL) 1 % gel Apply 1 application topically 2 (two) times  daily.    . Cyanocobalamin (VITAMIN B-12) 2500 MCG SUBL Place 2,500 mcg under the tongue daily.    Marland Kitchen Dextromethorphan Polistirex (DELSYM PO) Take by mouth.    . ENSURE (ENSURE) Take 1 Can by mouth 2 (two) times daily between meals.     . erlotinib (TARCEVA) 100 MG tablet Take 1 tablet (100 mg total) by mouth daily. Take on an empty stomach 1 hour before meals or 2 hours after 30 tablet 1  . ferrous sulfate 325 (65 FE) MG EC tablet Take 325 mg by mouth daily with breakfast.    . fluticasone (VERAMYST) 27.5 MCG/SPRAY nasal spray Place 2 sprays into the nose daily.    . folic acid (FOLVITE) 892 MCG tablet Take 1 tablet (800 mcg total) by mouth daily. 30 tablet 1  . loratadine (CLARITIN) 10 MG tablet Take 1 tablet (10 mg total) by mouth daily. 30 tablet 0  . Nutritional Supplements (JUICE PLUS FIBRE PO) Take 1 capsule by mouth daily. Reported on 07/28/2015    . ranitidine (ZANTAC) 300 MG tablet Take 1 tablet (300 mg total) by mouth at bedtime. 30 tablet 6  . TURMERIC PO Take 1 capsule by mouth 2 (two) times daily. Turmeric Superior 750 mg po bid     No current facility-administered medications for this encounter.     Physical Findings:  height is '4\' 11"'$  (1.499 m) and weight is 109 lb 6.4 oz (49.6 kg). Her oral temperature is 97.6 F (36.4 C). Her blood pressure is 95/61 and her pulse is 102 (abnormal). Her respiration is 16 and oxygen saturation is 99%.  In general this is a well appearing Asian female in no acute distress. She's alert and oriented x4 and appropriate throughout the examination. Cardiopulmonary assessment is negative for acute distress and she exhibits normal effort. She is neurologically intact grossly.  Lab Findings: Lab Results  Component Value Date   WBC 7.3 05/08/2016   HGB 14.9 05/08/2016   HCT 43.1 05/08/2016   MCV 92.9 05/08/2016   PLT 363 05/08/2016     Radiographic Findings: Mr Jeri Cos JJ Contrast  Result Date: 05/18/2016 CLINICAL DATA:  Metastatic lung cancer.  EXAM: MRI HEAD WITHOUT AND WITH CONTRAST TECHNIQUE: Multiplanar, multiecho pulse sequences of the brain and surrounding structures were obtained without and with intravenous contrast. CONTRAST:  45m MULTIHANCE GADOBENATE DIMEGLUMINE 529 MG/ML IV SOLN COMPARISON:  MRI head 02/13/2016 FINDINGS: Brain: Multiple hemorrhagic lesions in the brain are stable from the prior study. These do not show significant enhancement or edema. Seven lesions are noted in the brain. No new lesions. Leptomeningeal enhancement is normal. Ventricle size normal.  No acute infarct. Vascular: Normal arterial flow voids Skull and upper cervical spine: Negative Sinuses/Orbits: Mild mucosal edema paranasal sinuses.  Normal orbit. Other: None IMPRESSION: Stable MRI. Multiple hemorrhagic treated lesions in the brain compatible with metastatic disease without interval change. Electronically Signed   By: CFranchot GalloM.D.   On:  05/18/2016 11:53    Impression/Plan: 1. Stage IV T2a, N1, M1b NSCLC, adenocarcinoma of the right middle lobe with metastatic disease to bone and brain.The patient appears to be doing well overall. She continues to be stable in the CNS radiographically, and we would continue to recommend repeat imaging of the brain in 3 month intervals. She will return at that time and keep [place] informed any questions or concerns that arise prior to that visit. She will otherwise continue Tarceva and proceed with systemic imaging with Dr. Julien Nordmann.     Carola Rhine, PAC

## 2016-05-28 ENCOUNTER — Ambulatory Visit (HOSPITAL_COMMUNITY)
Admission: RE | Admit: 2016-05-28 | Discharge: 2016-05-28 | Disposition: A | Discharge status: Home / Self Care | Payer: Medicaid Other | Source: Ambulatory Visit | Attending: Internal Medicine | Admitting: Internal Medicine

## 2016-05-28 ENCOUNTER — Other Ambulatory Visit (HOSPITAL_BASED_OUTPATIENT_CLINIC_OR_DEPARTMENT_OTHER): Payer: Medicaid Other

## 2016-05-28 ENCOUNTER — Encounter (HOSPITAL_COMMUNITY): Payer: Self-pay

## 2016-05-28 DIAGNOSIS — C342 Malignant neoplasm of middle lobe, bronchus or lung: Secondary | ICD-10-CM

## 2016-05-28 DIAGNOSIS — C3491 Malignant neoplasm of unspecified part of right bronchus or lung: Secondary | ICD-10-CM | POA: Insufficient documentation

## 2016-05-28 DIAGNOSIS — C7951 Secondary malignant neoplasm of bone: Secondary | ICD-10-CM | POA: Diagnosis present

## 2016-05-28 DIAGNOSIS — C7931 Secondary malignant neoplasm of brain: Secondary | ICD-10-CM

## 2016-05-28 DIAGNOSIS — Z5111 Encounter for antineoplastic chemotherapy: Secondary | ICD-10-CM

## 2016-05-28 DIAGNOSIS — Z9221 Personal history of antineoplastic chemotherapy: Secondary | ICD-10-CM | POA: Diagnosis not present

## 2016-05-28 DIAGNOSIS — C7972 Secondary malignant neoplasm of left adrenal gland: Secondary | ICD-10-CM | POA: Diagnosis not present

## 2016-05-28 LAB — COMPREHENSIVE METABOLIC PANEL
ALBUMIN: 3.8 g/dL (ref 3.5–5.0)
ALK PHOS: 47 U/L (ref 40–150)
ALT: 16 U/L (ref 0–55)
AST: 17 U/L (ref 5–34)
Anion Gap: 10 mEq/L (ref 3–11)
BILIRUBIN TOTAL: 0.62 mg/dL (ref 0.20–1.20)
BUN: 9.6 mg/dL (ref 7.0–26.0)
CO2: 27 mEq/L (ref 22–29)
CREATININE: 0.6 mg/dL (ref 0.6–1.1)
Calcium: 9.1 mg/dL (ref 8.4–10.4)
Chloride: 103 mEq/L (ref 98–109)
EGFR: >90 mL/min/{1.73_m2} (ref >90)
GLUCOSE: 78 mg/dL (ref 70–140)
POTASSIUM: 4.1 meq/L (ref 3.5–5.1)
SODIUM: 140 meq/L (ref 136–145)
TOTAL PROTEIN: 6.9 g/dL (ref 6.4–8.3)

## 2016-05-28 LAB — CBC WITH DIFFERENTIAL/PLATELET
BASO%: 0.6 % (ref 0.0–2.0)
Basophils Absolute: 0 10*3/uL (ref 0.0–0.1)
EOS%: 1.5 % (ref 0.0–7.0)
Eosinophils Absolute: 0.1 10*3/uL (ref 0.0–0.5)
HCT: 41.3 % (ref 34.8–46.6)
HEMOGLOBIN: 14.3 g/dL (ref 11.6–15.9)
LYMPH#: 1.3 10*3/uL (ref 0.9–3.3)
LYMPH%: 18.5 % (ref 14.0–49.7)
MCH: 32.6 pg (ref 25.1–34.0)
MCHC: 34.6 g/dL (ref 31.5–36.0)
MCV: 94.3 fL (ref 79.5–101.0)
MONO#: 0.5 10*3/uL (ref 0.1–0.9)
MONO%: 7.3 % (ref 0.0–14.0)
NEUT%: 72.1 % (ref 38.4–76.8)
NEUTROS ABS: 5.1 10*3/uL (ref 1.5–6.5)
Platelets: 392 10*3/uL (ref 145–400)
RBC: 4.38 10*6/uL (ref 3.70–5.45)
RDW: 13.2 % (ref 11.2–14.5)
WBC: 7 10*3/uL (ref 3.9–10.3)

## 2016-05-28 MED ORDER — IOPAMIDOL (ISOVUE-300) INJECTION 61%
INTRAVENOUS | Status: AC
  Filled 2016-05-28: qty 100

## 2016-05-28 MED ORDER — IOPAMIDOL (ISOVUE-300) INJECTION 61%
INTRAVENOUS | Status: AC
  Administered 2016-05-28: 100 mL via INTRAVENOUS
  Filled 2016-05-28: qty 100

## 2016-05-28 MED FILL — raNITIdine HCL 300 MG TABS: 300 | 30 days supply | Qty: 30 | Fill #3

## 2016-05-31 ENCOUNTER — Ambulatory Visit (HOSPITAL_BASED_OUTPATIENT_CLINIC_OR_DEPARTMENT_OTHER): Payer: Medicaid Other | Admitting: Internal Medicine

## 2016-05-31 ENCOUNTER — Ambulatory Visit (HOSPITAL_BASED_OUTPATIENT_CLINIC_OR_DEPARTMENT_OTHER): Payer: Medicaid Other

## 2016-05-31 ENCOUNTER — Telehealth: Payer: Self-pay | Admitting: Internal Medicine

## 2016-05-31 ENCOUNTER — Encounter: Payer: Self-pay | Admitting: Internal Medicine

## 2016-05-31 ENCOUNTER — Telehealth: Payer: Self-pay | Admitting: Pharmacist

## 2016-05-31 VITALS — BP 104/62 | HR 104 | Temp 98.2°F | Resp 20 | Ht 59.0 in | Wt 111.1 lb

## 2016-05-31 DIAGNOSIS — Z5111 Encounter for antineoplastic chemotherapy: Secondary | ICD-10-CM

## 2016-05-31 DIAGNOSIS — C7951 Secondary malignant neoplasm of bone: Secondary | ICD-10-CM | POA: Diagnosis not present

## 2016-05-31 DIAGNOSIS — R21 Rash and other nonspecific skin eruption: Secondary | ICD-10-CM

## 2016-05-31 DIAGNOSIS — C7972 Secondary malignant neoplasm of left adrenal gland: Secondary | ICD-10-CM

## 2016-05-31 DIAGNOSIS — C7931 Secondary malignant neoplasm of brain: Secondary | ICD-10-CM | POA: Diagnosis not present

## 2016-05-31 DIAGNOSIS — C342 Malignant neoplasm of middle lobe, bronchus or lung: Secondary | ICD-10-CM

## 2016-05-31 DIAGNOSIS — C3491 Malignant neoplasm of unspecified part of right bronchus or lung: Secondary | ICD-10-CM

## 2016-05-31 LAB — CBC WITH DIFFERENTIAL/PLATELET
BASO%: 1 % (ref 0.0–2.0)
BASOS ABS: 0.1 10*3/uL (ref 0.0–0.1)
EOS%: 1.5 % (ref 0.0–7.0)
Eosinophils Absolute: 0.1 10*3/uL (ref 0.0–0.5)
HCT: 41.4 % (ref 34.8–46.6)
HEMOGLOBIN: 14.1 g/dL (ref 11.6–15.9)
LYMPH%: 15.5 % (ref 14.0–49.7)
MCH: 32.3 pg (ref 25.1–34.0)
MCHC: 34 g/dL (ref 31.5–36.0)
MCV: 94.9 fL (ref 79.5–101.0)
MONO#: 0.6 10*3/uL (ref 0.1–0.9)
MONO%: 7.8 % (ref 0.0–14.0)
NEUT#: 5.3 10*3/uL (ref 1.5–6.5)
NEUT%: 74.2 % (ref 38.4–76.8)
Platelets: 417 10*3/uL — ABNORMAL HIGH (ref 145–400)
RBC: 4.36 10*6/uL (ref 3.70–5.45)
RDW: 13.2 % (ref 11.2–14.5)
WBC: 7.1 10*3/uL (ref 3.9–10.3)
lymph#: 1.1 10*3/uL (ref 0.9–3.3)

## 2016-05-31 LAB — COMPREHENSIVE METABOLIC PANEL
ALBUMIN: 3.9 g/dL (ref 3.5–5.0)
ALK PHOS: 46 U/L (ref 40–150)
ALT: 14 U/L (ref 0–55)
ANION GAP: 7 meq/L (ref 3–11)
AST: 16 U/L (ref 5–34)
BILIRUBIN TOTAL: 0.6 mg/dL (ref 0.20–1.20)
BUN: 16 mg/dL (ref 7.0–26.0)
CALCIUM: 9.2 mg/dL (ref 8.4–10.4)
CO2: 29 mEq/L (ref 22–29)
Chloride: 102 mEq/L (ref 98–109)
Creatinine: 0.6 mg/dL (ref 0.6–1.1)
Glucose: 68 mg/dl — ABNORMAL LOW (ref 70–140)
POTASSIUM: 4.1 meq/L (ref 3.5–5.1)
Sodium: 138 mEq/L (ref 136–145)
TOTAL PROTEIN: 7.2 g/dL (ref 6.4–8.3)

## 2016-05-31 MED ORDER — OSIMERTINIB MESYLATE 80 MG PO TABS: 80.0000 mg | ORAL_TABLET | Freq: Every day | ORAL | 2 refills | Status: DC

## 2016-05-31 MED FILL — TAGRISSO 80 MG TABLET: 80 | 30 days supply | Qty: 30 | Fill #0

## 2016-05-31 NOTE — Telephone Encounter (Signed)
Injections scheduled every 4 weeks per standing order/ Dr Julien Nordmann verbal. Lab appointment added for today per [place] Nurse. (Not in LOS) Appointments scheduled per 05/31/16 los, Patient was given a copy of the AVS report and appointment schedule, per 05/31/16 los.

## 2016-05-31 NOTE — Telephone Encounter (Signed)
Oral Chemotherapy Pharmacist Encounter  Received notification from Dr. Julien Nordmann that he would be starting patient on Deborah Foster for stage IV NSCLC, EGFR+, after progression on Tarceva Labs from 05/28/16 reviewed, OK for treatment Current medication list in Epic assessed, no DDIs with Tagrisso identified  I met with patient in exam room, with assistance of a [place] interpreter, for overview of new oral chemotherapy medication: Tagrisso. Pt is doing well.  The prescription was sent to the [facility] for benefit analysis and approval. Noted patient with Medicaid prescription insurance coverage  Counseled patient on administration, dosing, side effects, safe handling, and monitoring. Patient will take 1 tablet (21m total) by mouth once daily without regard to food. Side effects include but not limited to: rash, N/V/D, back pain, and fatyigue.  Ms. LOrdazvoiced understanding and appreciation.   All questions answered.  Patient knows to call the office with questions or concerns. Oral Oncology Clinic will continue to follow.  Thank you,  Deborah Foster PharmD, BCPS, BCOP 05/31/2016  10:01 AM Oral Oncology Clinic [phone]

## 2016-05-31 NOTE — Progress Notes (Signed)
[facility] [phone]   [phone]  OFFICE PROGRESS NOTE  [name], MD [place] [address]  DIAGNOSIS: Stage IV (T2a, N1, M1b) non-small cell lung cancer, adenocarcinoma with positive EGFR mutation (deletion 34) presented with right middle lobe lung mass, right hilar adenopathy as well as metastatic disease to the bone, brain and left adrenal diagnosed in July 2016.  PRIOR THERAPY: 1) palliative radiotherapy to the brain as well as metastatic bone lesions in the pelvis. 2) Tarceva 150 mg by mouth daily started on 09/25/2014. Status post 5 months of treatment. 3) status post palliative radiotherapy to the right middle lobe lung mass for treatment of hemoptysis.  CURRENT THERAPY: 1) Tarceva 100 mg by mouth daily started 02/19/2015 status post 13 months of treatment. 2) Xgeva 120 g subcutaneously on monthly basis.  INTERVAL HISTORY: Deborah Foster 46 y.o. female returns to the clinic today for follow-up visit. The patient is feeling fine today was no specific complaints except for persistent cough and intermittent pain in the left shoulder. She denied having any significant shortness of breath or hemoptysis. She is using a rubbing oil for the left shoulder with some improvement. She denied having any recent weight loss or night sweats. She has no nausea, vomiting, diarrhea or constipation. She continues to have grade 1 skin rash on the face from her treatment with Tarceva. The patient had repeat CT scan of the chest, abdomen and pelvis performed recently and she is here for evaluation and discussion of her scan results.  MEDICAL HISTORY: Past Medical History:  Diagnosis Date  . Bone metastasis ([place])   . Hemoptysis   . Hypokalemia   . lung ca dx'd 07/2014  . Lung mass   . Metastasis to adrenal gland ([place])   . Metastasis to brain ([place])   . Pneumonia   . Radiation 08/23/14-09/07/14   Brain/chest and left hip 30 Gy 12  Fx  . URI (upper respiratory infection) 02-02-2015    ALLERGIES:  is allergic to other and doxycycline.  MEDICATIONS:  Current Outpatient Prescriptions  Medication Sig Dispense Refill  . benzonatate (TESSALON) 100 MG capsule Take 1 capsule (100 mg total) by mouth every 8 (eight) hours as needed. 20 capsule 0  . chlorpheniramine-HYDROcodone (TUSSIONEX) 10-8 MG/5ML SUER Take 5 mLs by mouth every 12 (twelve) hours as needed. 140 mL 0  . clindamycin (CLINDAGEL) 1 % gel Apply 1 application topically 2 (two) times daily.    . Cyanocobalamin (VITAMIN B-12) 2500 MCG SUBL Place 2,500 mcg under the tongue daily.    Marland Kitchen Dextromethorphan Polistirex (DELSYM PO) Take by mouth.    . ENSURE (ENSURE) Take 1 Can by mouth 2 (two) times daily between meals.     . erlotinib (TARCEVA) 100 MG tablet Take 1 tablet (100 mg total) by mouth daily. Take on an empty stomach 1 hour before meals or 2 hours after 30 tablet 1  . ferrous sulfate 325 (65 FE) MG EC tablet Take 325 mg by mouth daily with breakfast.    . fluticasone (VERAMYST) 27.5 MCG/SPRAY nasal spray Place 2 sprays into the nose daily.    . folic acid (FOLVITE) 009 MCG tablet Take 1 tablet (800 mcg total) by mouth daily. 30 tablet 1  . loratadine (CLARITIN) 10 MG tablet Take 1 tablet (10 mg total) by mouth daily. 30 tablet 0  . Nutritional Supplements (JUICE PLUS FIBRE PO) Take 1 capsule by mouth daily. Reported on  07/28/2015    . ranitidine (ZANTAC) 300 MG tablet Take 1 tablet (300 mg total) by mouth at bedtime. 30 tablet 6  . TURMERIC PO Take 1 capsule by mouth 2 (two) times daily. Turmeric Superior 750 mg po bid     No current facility-administered medications for this visit.     SURGICAL HISTORY:  Past Surgical History:  Procedure Laterality Date  . IR GENERIC HISTORICAL  07/14/2015   IR RADIOLOGIST EVAL & MGMT 07/14/2015 Berdine Dance, MD [facility] INTERV RAD  . VIDEO BRONCHOSCOPY Bilateral 08/09/2014   Procedure: VIDEO BRONCHOSCOPY WITHOUT FLUORO;   Surgeon: Lupita Leash, MD;  Location: [facility] ENDOSCOPY;  Service: Cardiopulmonary;  Laterality: Bilateral;  . VIDEO BRONCHOSCOPY Bilateral 07/06/2015   Procedure: VIDEO BRONCHOSCOPY WITHOUT FLUORO;  Surgeon: Lupita Leash, MD;  Location: [facility] ENDOSCOPY;  Service: Cardiopulmonary;  Laterality: Bilateral;    REVIEW OF SYSTEMS:  Constitutional: positive for fatigue Eyes: negative Ears, nose, mouth, throat, and face: negative Respiratory: positive for cough Cardiovascular: negative Gastrointestinal: negative Genitourinary:negative Integument/breast: negative Hematologic/lymphatic: negative Musculoskeletal:negative Neurological: negative Behavioral/Psych: negative Endocrine: negative Allergic/Immunologic: negative   PHYSICAL EXAMINATION: General appearance: alert, cooperative and no distress Head: Normocephalic, without obvious abnormality, atraumatic Neck: no adenopathy, no JVD, supple, symmetrical, trachea midline and thyroid not enlarged, symmetric, no tenderness/mass/nodules Lymph nodes: Cervical, supraclavicular, and axillary nodes normal. Resp: clear to auscultation bilaterally Back: symmetric, no curvature. ROM normal. No CVA tenderness. Cardio: regular rate and rhythm, S1, S2 normal, no murmur, click, rub or gallop GI: soft, non-tender; bowel sounds normal; no masses,  no organomegaly Extremities: extremities normal, atraumatic, no cyanosis or edema Neurologic: Alert and oriented X 3, normal strength and tone. Normal symmetric reflexes. Normal coordination and gait  ECOG PERFORMANCE STATUS: 1 - Symptomatic but completely ambulatory  Blood pressure 104/62, pulse (!) 104, temperature 98.2 F (36.8 C), temperature source Oral, resp. rate 20, height 4\' 11"  (1.499 m), weight 111 lb 1.6 oz (50.4 kg), SpO2 100 %.  LABORATORY DATA: Lab Results  Component Value Date   WBC 7.0 05/28/2016   HGB 14.3 05/28/2016   HCT 41.3 05/28/2016   MCV 94.3 05/28/2016   PLT 392 05/28/2016       Chemistry      Component Value Date/Time   NA 140 05/28/2016 0740   K 4.1 05/28/2016 0740   CL 109 07/19/2015 0509   CO2 27 05/28/2016 0740   BUN 9.6 05/28/2016 0740   CREATININE 0.6 05/28/2016 0740      Component Value Date/Time   CALCIUM 9.1 05/28/2016 0740   ALKPHOS 47 05/28/2016 0740   AST 17 05/28/2016 0740   ALT 16 05/28/2016 0740   BILITOT 0.62 05/28/2016 0740       RADIOGRAPHIC STUDIES: Ct Chest W Contrast  Result Date: 05/28/2016 CLINICAL DATA:  Right lung cancer with ongoing oral chemotherapy. Cough, low back pain and abdominal pain. EXAM: CT CHEST, ABDOMEN, AND PELVIS WITH CONTRAST TECHNIQUE: Multidetector CT imaging of the chest, abdomen and pelvis was performed following the standard protocol during bolus administration of intravenous contrast. CONTRAST:  <See Chart> ISOVUE-300 IOPAMIDOL (ISOVUE-300) INJECTION 61% COMPARISON:  CT chest 03/02/2016 and CT abdomen pelvis 11/22/2015. FINDINGS: CT CHEST FINDINGS Cardiovascular: Vascular structures are unremarkable. Heart size normal. No pericardial effusion. Mediastinum/Nodes: Soft tissue in the prevascular space conforms to the shape of the thymus. No pathologically enlarged mediastinal, hilar or axillary lymph nodes. Esophagus is grossly unremarkable. Lungs/Pleura: Scattered pulmonary nodules measure 3 mm or less in size and are new or enlarging.  Posttreatment collapse/ consolidation and bronchiectasis in the posteromedial right hemithorax. Trace right pleural fluid. No left pleural fluid. Airway is otherwise unremarkable. Musculoskeletal: Sclerotic lesions are seen in the proximal right humerus, right third rib and manubrium, as before. CT ABDOMEN PELVIS FINDINGS Hepatobiliary: Liver and gallbladder are unremarkable. Common bile duct measures up to 1.4 cm, as before. Mild intrahepatic biliary ductal dilatation, similar. Pancreas: Negative. Spleen: Negative. Adrenals/Urinary Tract: Right adrenal gland is unremarkable.  Low-attenuation lesion in the lateral limb left adrenal gland measures 0.9 x 2.6 cm, previously 0.8 x 1.3 cm on 11/22/2015. Kidneys are unremarkable. Ureters are decompressed. Bladder is low in volume. Stomach/Bowel: Stomach, small bowel, appendix and colon are unremarkable. Vascular/Lymphatic: Vascular structures are unremarkable. Abdominal retroperitoneal lymph nodes measure up to 11 mm in the left periaortic station (series 2, image 64), previously 5 mm. Reproductive: Uterus is visualized. Probable nabothian cysts incidentally noted. No adnexal mass. Other: No free fluid.  Mesenteries peritoneum are unremarkable. Musculoskeletal: Mixed lytic and sclerotic lesions throughout the visualized osseous structures are grossly stable. IMPRESSION: 1. New and enlarging bilateral pulmonary nodules, worrisome metastatic disease. 2. New abdominal retroperitoneal adenopathy, indicative of metastatic disease. 3. Enlarging left adrenal metastasis. 4. Osseous metastatic disease, grossly stable. Electronically Signed   By: Leanna Battles M.D.   On: 05/28/2016 09:47   Mr Laqueta Jean JM Contrast  Result Date: 05/18/2016 CLINICAL DATA:  Metastatic lung cancer. EXAM: MRI HEAD WITHOUT AND WITH CONTRAST TECHNIQUE: Multiplanar, multiecho pulse sequences of the brain and surrounding structures were obtained without and with intravenous contrast. CONTRAST:  6mL MULTIHANCE GADOBENATE DIMEGLUMINE 529 MG/ML IV SOLN COMPARISON:  MRI head 02/13/2016 FINDINGS: Brain: Multiple hemorrhagic lesions in the brain are stable from the prior study. These do not show significant enhancement or edema. Seven lesions are noted in the brain. No new lesions. Leptomeningeal enhancement is normal. Ventricle size normal.  No acute infarct. Vascular: Normal arterial flow voids Skull and upper cervical spine: Negative Sinuses/Orbits: Mild mucosal edema paranasal sinuses.  Normal orbit. Other: None IMPRESSION: Stable MRI. Multiple hemorrhagic treated lesions in the  brain compatible with metastatic disease without interval change. Electronically Signed   By: Marlan Palau M.D.   On: 05/18/2016 11:53   Ct Abdomen Pelvis W Contrast  Result Date: 05/28/2016 CLINICAL DATA:  Right lung cancer with ongoing oral chemotherapy. Cough, low back pain and abdominal pain. EXAM: CT CHEST, ABDOMEN, AND PELVIS WITH CONTRAST TECHNIQUE: Multidetector CT imaging of the chest, abdomen and pelvis was performed following the standard protocol during bolus administration of intravenous contrast. CONTRAST:  <See Chart> ISOVUE-300 IOPAMIDOL (ISOVUE-300) INJECTION 61% COMPARISON:  CT chest 03/02/2016 and CT abdomen pelvis 11/22/2015. FINDINGS: CT CHEST FINDINGS Cardiovascular: Vascular structures are unremarkable. Heart size normal. No pericardial effusion. Mediastinum/Nodes: Soft tissue in the prevascular space conforms to the shape of the thymus. No pathologically enlarged mediastinal, hilar or axillary lymph nodes. Esophagus is grossly unremarkable. Lungs/Pleura: Scattered pulmonary nodules measure 3 mm or less in size and are new or enlarging. Posttreatment collapse/ consolidation and bronchiectasis in the posteromedial right hemithorax. Trace right pleural fluid. No left pleural fluid. Airway is otherwise unremarkable. Musculoskeletal: Sclerotic lesions are seen in the proximal right humerus, right third rib and manubrium, as before. CT ABDOMEN PELVIS FINDINGS Hepatobiliary: Liver and gallbladder are unremarkable. Common bile duct measures up to 1.4 cm, as before. Mild intrahepatic biliary ductal dilatation, similar. Pancreas: Negative. Spleen: Negative. Adrenals/Urinary Tract: Right adrenal gland is unremarkable. Low-attenuation lesion in the lateral limb left adrenal gland measures 0.9  x 2.6 cm, previously 0.8 x 1.3 cm on 11/22/2015. Kidneys are unremarkable. Ureters are decompressed. Bladder is low in volume. Stomach/Bowel: Stomach, small bowel, appendix and colon are unremarkable.  Vascular/Lymphatic: Vascular structures are unremarkable. Abdominal retroperitoneal lymph nodes measure up to 11 mm in the left periaortic station (series 2, image 64), previously 5 mm. Reproductive: Uterus is visualized. Probable nabothian cysts incidentally noted. No adnexal mass. Other: No free fluid.  Mesenteries peritoneum are unremarkable. Musculoskeletal: Mixed lytic and sclerotic lesions throughout the visualized osseous structures are grossly stable. IMPRESSION: 1. New and enlarging bilateral pulmonary nodules, worrisome metastatic disease. 2. New abdominal retroperitoneal adenopathy, indicative of metastatic disease. 3. Enlarging left adrenal metastasis. 4. Osseous metastatic disease, grossly stable. Electronically Signed   By: Lorin Picket M.D.   On: 05/28/2016 09:47    ASSESSMENT AND PLAN:  This is a very pleasant 46 years old Asian female with a stage IV non-small cell lung cancer, adenocarcinoma with positive EGFR mutation with deletion in exon 3 diagnosed in July 2016. The patient is currently on treatment with Tarceva 100 mg by mouth daily and has been tolerating it well except for few episodes of diarrhea and a skin rash. She had repeat CT scan of the chest, abdomen and pelvis. I personally and independently reviewed the scan images and discuss the results with the patient and showed her the images today. Unfortunately there is evidence for disease progression with new and enlarging bilateral pulmonary nodules in addition to enlargement of the left adrenal metastasis and new abdominal retroperitoneal lymphadenopathy. I will order a blood test for T790M resistant mutation but I will switch the patient to Tagrisso 80 mg by mouth daily. For the cough, she will continue with her current cough medication with Tessalon. I will see her back for follow-up visit in 3 weeks for reevaluation with repeat blood work. For the metastatic bone disease, she will continue her current treatment with  Xgeva. The patient was advised to call immediately if she has any concerning symptoms in the interval. The patient voices understanding of current disease status and treatment options and is in agreement with the current care plan. All questions were answered. The patient knows to call the clinic with any problems, questions or concerns. We can certainly see the patient much sooner if necessary.  Disclaimer: This note was dictated with voice recognition software. Similar sounding words can inadvertently be transcribed and may not be corrected upon review.

## 2016-06-06 LAB — EPIDERMAL GROWTH FACTOR RECEPTOR (EGFR) MUTATION ANALYSIS

## 2016-06-07 ENCOUNTER — Encounter: Payer: Self-pay | Admitting: Nutrition

## 2016-06-07 ENCOUNTER — Other Ambulatory Visit (HOSPITAL_BASED_OUTPATIENT_CLINIC_OR_DEPARTMENT_OTHER): Payer: Medicaid Other

## 2016-06-07 ENCOUNTER — Ambulatory Visit (HOSPITAL_BASED_OUTPATIENT_CLINIC_OR_DEPARTMENT_OTHER): Payer: Medicaid Other

## 2016-06-07 VITALS — BP 103/57 | HR 94 | Temp 97.1°F | Resp 18

## 2016-06-07 DIAGNOSIS — C7931 Secondary malignant neoplasm of brain: Secondary | ICD-10-CM

## 2016-06-07 DIAGNOSIS — C7972 Secondary malignant neoplasm of left adrenal gland: Secondary | ICD-10-CM

## 2016-06-07 DIAGNOSIS — C3491 Malignant neoplasm of unspecified part of right bronchus or lung: Secondary | ICD-10-CM

## 2016-06-07 DIAGNOSIS — C7951 Secondary malignant neoplasm of bone: Secondary | ICD-10-CM | POA: Diagnosis present

## 2016-06-07 DIAGNOSIS — M9080 Osteopathy in diseases classified elsewhere, unspecified site: Secondary | ICD-10-CM

## 2016-06-07 DIAGNOSIS — C342 Malignant neoplasm of middle lobe, bronchus or lung: Secondary | ICD-10-CM

## 2016-06-07 DIAGNOSIS — R21 Rash and other nonspecific skin eruption: Secondary | ICD-10-CM

## 2016-06-07 DIAGNOSIS — M898X9 Other specified disorders of bone, unspecified site: Secondary | ICD-10-CM

## 2016-06-07 DIAGNOSIS — C3431 Malignant neoplasm of lower lobe, right bronchus or lung: Secondary | ICD-10-CM

## 2016-06-07 DIAGNOSIS — Z5111 Encounter for antineoplastic chemotherapy: Secondary | ICD-10-CM

## 2016-06-07 DIAGNOSIS — E889 Metabolic disorder, unspecified: Secondary | ICD-10-CM

## 2016-06-07 LAB — CBC WITH DIFFERENTIAL/PLATELET
BASO%: 0.4 % (ref 0.0–2.0)
Basophils Absolute: 0 10*3/uL (ref 0.0–0.1)
EOS ABS: 0.1 10*3/uL (ref 0.0–0.5)
EOS%: 1.9 % (ref 0.0–7.0)
HCT: 41.7 % (ref 34.8–46.6)
HEMOGLOBIN: 14.3 g/dL (ref 11.6–15.9)
LYMPH%: 14.4 % (ref 14.0–49.7)
MCH: 32.4 pg (ref 25.1–34.0)
MCHC: 34.3 g/dL (ref 31.5–36.0)
MCV: 94.5 fL (ref 79.5–101.0)
MONO#: 0.5 10*3/uL (ref 0.1–0.9)
MONO%: 7.5 % (ref 0.0–14.0)
NEUT%: 75.8 % (ref 38.4–76.8)
NEUTROS ABS: 5.3 10*3/uL (ref 1.5–6.5)
Platelets: 343 10*3/uL (ref 145–400)
RBC: 4.41 10*6/uL (ref 3.70–5.45)
RDW: 13.2 % (ref 11.2–14.5)
WBC: 6.9 10*3/uL (ref 3.9–10.3)
lymph#: 1 10*3/uL (ref 0.9–3.3)

## 2016-06-07 LAB — COMPREHENSIVE METABOLIC PANEL
ALBUMIN: 3.8 g/dL (ref 3.5–5.0)
ALK PHOS: 52 U/L (ref 40–150)
ALT: 18 U/L (ref 0–55)
AST: 18 U/L (ref 5–34)
Anion Gap: 6 mEq/L (ref 3–11)
BUN: 15.5 mg/dL (ref 7.0–26.0)
CO2: 27 mEq/L (ref 22–29)
CREATININE: 0.7 mg/dL (ref 0.6–1.1)
Calcium: 9.2 mg/dL (ref 8.4–10.4)
Chloride: 105 mEq/L (ref 98–109)
GLUCOSE: 107 mg/dL (ref 70–140)
Potassium: 3.8 mEq/L (ref 3.5–5.1)
Sodium: 137 mEq/L (ref 136–145)
TOTAL PROTEIN: 6.9 g/dL (ref 6.4–8.3)
Total Bilirubin: 0.46 mg/dL (ref 0.20–1.20)

## 2016-06-07 MED ORDER — DENOSUMAB 120 MG/1.7ML ~~LOC~~ SOLN
120.0000 mg | Freq: Once | SUBCUTANEOUS | Status: AC
  Administered 2016-06-07: 120 mg via SUBCUTANEOUS
  Filled 2016-06-07: qty 1.7

## 2016-06-07 NOTE — Patient Instructions (Signed)
Denosumab injection  What is this medicine?  DENOSUMAB (den oh sue mab) slows bone breakdown. Prolia is used to treat osteoporosis in women after menopause and in men. Xgeva is used to prevent bone fractures and other bone problems caused by cancer bone metastases. Xgeva is also used to treat giant cell tumor of the bone.  This medicine may be used for other purposes; ask your health care provider or pharmacist if you have questions.  What should I tell my health care provider before I take this medicine?  They need to know if you have any of these conditions:  -dental disease  -eczema  -infection or history of infections  -kidney disease or on dialysis  -low blood calcium or vitamin D  -malabsorption syndrome  -scheduled to have surgery or tooth extraction  -taking medicine that contains denosumab  -thyroid or parathyroid disease  -an unusual reaction to denosumab, other medicines, foods, dyes, or preservatives  -pregnant or trying to get pregnant  -breast-feeding  How should I use this medicine?  This medicine is for injection under the skin. It is given by a health care professional in a hospital or clinic setting.  If you are getting Prolia, a special MedGuide will be given to you by the pharmacist with each prescription and refill. Be sure to read this information carefully each time.  For Prolia, talk to your pediatrician regarding the use of this medicine in children. Special care may be needed. For Xgeva, talk to your pediatrician regarding the use of this medicine in children. While this drug may be prescribed for children as young as 13 years for selected conditions, precautions do apply.  Overdosage: If you think you have taken too much of this medicine contact a poison control center or emergency room at once.  NOTE: This medicine is only for you. Do not share this medicine with others.  What if I miss a dose?  It is important not to miss your dose. Call your doctor or health care professional if you are  unable to keep an appointment.  What may interact with this medicine?  Do not take this medicine with any of the following medications:  -other medicines containing denosumab  This medicine may also interact with the following medications:  -medicines that suppress the immune system  -medicines that treat cancer  -steroid medicines like prednisone or cortisone  This list may not describe all possible interactions. Give your health care provider a list of all the medicines, herbs, non-prescription drugs, or dietary supplements you use. Also tell them if you smoke, drink alcohol, or use illegal drugs. Some items may interact with your medicine.  What should I watch for while using this medicine?  Visit your doctor or health care professional for regular checks on your progress. Your doctor or health care professional may order blood tests and other tests to see how you are doing.  Call your doctor or health care professional if you get a cold or other infection while receiving this medicine. Do not treat yourself. This medicine may decrease your body's ability to fight infection.  You should make sure you get enough calcium and vitamin D while you are taking this medicine, unless your doctor tells you not to. Discuss the foods you eat and the vitamins you take with your health care professional.  See your dentist regularly. Brush and floss your teeth as directed. Before you have any dental work done, tell your dentist you are receiving this medicine.  Do   not become pregnant while taking this medicine or for 5 months after stopping it. Women should inform their doctor if they wish to become pregnant or think they might be pregnant. There is a potential for serious side effects to an unborn child. Talk to your health care professional or pharmacist for more information.  What side effects may I notice from receiving this medicine?  Side effects that you should report to your doctor or health care professional as soon as  possible:  -allergic reactions like skin rash, itching or hives, swelling of the face, lips, or tongue  -breathing problems  -chest pain  -fast, irregular heartbeat  -feeling faint or lightheaded, falls  -fever, chills, or any other sign of infection  -muscle spasms, tightening, or twitches  -numbness or tingling  -skin blisters or bumps, or is dry, peels, or red  -slow healing or unexplained pain in the mouth or jaw  -unusual bleeding or bruising  Side effects that usually do not require medical attention (Report these to your doctor or health care professional if they continue or are bothersome.):  -muscle pain  -stomach upset, gas  This list may not describe all possible side effects. Call your doctor for medical advice about side effects. You may report side effects to FDA at 1-800-FDA-1088.  Where should I keep my medicine?  This medicine is only given in a clinic, doctor's office, or other health care setting and will not be stored at home.  NOTE: This sheet is a summary. It may not cover all possible information. If you have questions about this medicine, talk to your doctor, pharmacist, or health care provider.      2016, Elsevier/Gold Standard. (2011-07-02 12:37:47)

## 2016-06-07 NOTE — Progress Notes (Signed)
Provided 2nd complimentary cases of Ensure Plus.

## 2016-06-20 ENCOUNTER — Other Ambulatory Visit: Payer: Self-pay

## 2016-06-20 ENCOUNTER — Ambulatory Visit: Payer: Self-pay | Admitting: Internal Medicine

## 2016-06-21 ENCOUNTER — Telehealth: Payer: Self-pay | Admitting: Internal Medicine

## 2016-06-21 NOTE — Telephone Encounter (Signed)
R/s appt per patient - missed appt on 6/6 - patient aware of appt time and date.

## 2016-06-25 MED FILL — TAGRISSO 80 MG TABLET: 80 | 30 days supply | Qty: 30 | Fill #1

## 2016-06-26 ENCOUNTER — Ambulatory Visit (HOSPITAL_BASED_OUTPATIENT_CLINIC_OR_DEPARTMENT_OTHER): Payer: Medicaid Other | Admitting: Internal Medicine

## 2016-06-26 ENCOUNTER — Encounter: Payer: Self-pay | Admitting: Internal Medicine

## 2016-06-26 ENCOUNTER — Telehealth: Payer: Self-pay | Admitting: Internal Medicine

## 2016-06-26 ENCOUNTER — Other Ambulatory Visit (HOSPITAL_BASED_OUTPATIENT_CLINIC_OR_DEPARTMENT_OTHER): Payer: Medicaid Other

## 2016-06-26 VITALS — BP 96/60 | HR 98 | Temp 98.0°F | Resp 18 | Ht 59.0 in | Wt 109.9 lb

## 2016-06-26 DIAGNOSIS — C342 Malignant neoplasm of middle lobe, bronchus or lung: Secondary | ICD-10-CM | POA: Diagnosis present

## 2016-06-26 DIAGNOSIS — C7972 Secondary malignant neoplasm of left adrenal gland: Secondary | ICD-10-CM | POA: Diagnosis not present

## 2016-06-26 DIAGNOSIS — C7951 Secondary malignant neoplasm of bone: Secondary | ICD-10-CM | POA: Diagnosis not present

## 2016-06-26 DIAGNOSIS — Z5111 Encounter for antineoplastic chemotherapy: Secondary | ICD-10-CM

## 2016-06-26 DIAGNOSIS — C3491 Malignant neoplasm of unspecified part of right bronchus or lung: Secondary | ICD-10-CM

## 2016-06-26 DIAGNOSIS — C7931 Secondary malignant neoplasm of brain: Secondary | ICD-10-CM

## 2016-06-26 LAB — CBC WITH DIFFERENTIAL/PLATELET
BASO%: 0.4 % (ref 0.0–2.0)
BASOS ABS: 0 10*3/uL (ref 0.0–0.1)
EOS ABS: 0.1 10*3/uL (ref 0.0–0.5)
EOS%: 1.8 % (ref 0.0–7.0)
HCT: 41.9 % (ref 34.8–46.6)
HGB: 14.1 g/dL (ref 11.6–15.9)
LYMPH%: 14 % (ref 14.0–49.7)
MCH: 32 pg (ref 25.1–34.0)
MCHC: 33.7 g/dL (ref 31.5–36.0)
MCV: 95 fL (ref 79.5–101.0)
MONO#: 0.6 10*3/uL (ref 0.1–0.9)
MONO%: 8.4 % (ref 0.0–14.0)
NEUT%: 75.4 % (ref 38.4–76.8)
NEUTROS ABS: 5.4 10*3/uL (ref 1.5–6.5)
PLATELETS: 339 10*3/uL (ref 145–400)
RBC: 4.41 10*6/uL (ref 3.70–5.45)
RDW: 12.7 % (ref 11.2–14.5)
WBC: 7.2 10*3/uL (ref 3.9–10.3)
lymph#: 1 10*3/uL (ref 0.9–3.3)

## 2016-06-26 LAB — COMPREHENSIVE METABOLIC PANEL
ALT: 14 U/L (ref 0–55)
ANION GAP: 9 meq/L (ref 3–11)
AST: 17 U/L (ref 5–34)
Albumin: 3.7 g/dL (ref 3.5–5.0)
Alkaline Phosphatase: 47 U/L (ref 40–150)
BILIRUBIN TOTAL: 0.37 mg/dL (ref 0.20–1.20)
BUN: 14.1 mg/dL (ref 7.0–26.0)
CO2: 28 meq/L (ref 22–29)
Calcium: 9.1 mg/dL (ref 8.4–10.4)
Chloride: 102 mEq/L (ref 98–109)
Creatinine: 0.7 mg/dL (ref 0.6–1.1)
Glucose: 79 mg/dl (ref 70–140)
Potassium: 4.2 mEq/L (ref 3.5–5.1)
Sodium: 139 mEq/L (ref 136–145)
TOTAL PROTEIN: 7.1 g/dL (ref 6.4–8.3)

## 2016-06-26 MED FILL — raNITIdine HCL 300 MG TABS: 300 | 30 days supply | Qty: 30 | Fill #3

## 2016-06-26 NOTE — Progress Notes (Signed)
[place] [phone]) [phone]   Fax:(336) [phone]  OFFICE PROGRESS NOTE  [name], MD [place] [address]  DIAGNOSIS: Stage IV (T2a, N1, M1b) non-small cell lung cancer, adenocarcinoma with positive EGFR mutation (deletion 49) presented with right middle lobe lung mass, right hilar adenopathy as well as metastatic disease to the bone, brain and left adrenal diagnosed in July 2016.  PRIOR THERAPY: 1) palliative radiotherapy to the brain as well as metastatic bone lesions in the pelvis. 2) Tarceva 150 mg by mouth daily started on 09/25/2014. Status post 5 months of treatment. 3) status post palliative radiotherapy to the right middle lobe lung mass for treatment of hemoptysis. 4) Tarceva 100 mg by mouth daily started 02/19/2015 status post 13 months of treatment. This was discontinued in May 2018 after the patient had disease progression and development of T790M resistant mutation.   CURRENT THERAPY: 1) Tagrisso 80 mg by mouth daily started 06/02/2016. 2) Xgeva 120 g subcutaneously on monthly basis.  INTERVAL HISTORY: Deborah Foster 46 y.o. female returns to the clinic today for follow-up visit accompanied by her interpreter. The patient is feeling fine today with no specific complaints. She is tolerating her current treatment with Tagrisso fairly well except for occasional episodes of diarrhea. Her skin rash is much better. She denied having any significant weight loss or night sweats. She has no nausea, vomiting, diarrhea or constipation. She has no fever or chills. She denied having any chest pain, shortness of breath, cough or hemoptysis. She has no headache or visual changes. She is here today for evaluation and repeat blood work.  MEDICAL HISTORY: Past Medical History:  Diagnosis Date  . Bone metastasis ([place])   . Hemoptysis   . Hypokalemia   . lung ca dx'd 07/2014  . Lung mass   . Metastasis to adrenal gland ([place])   .  Metastasis to brain ([place])   . Pneumonia   . Radiation 08/23/14-09/07/14   Brain/chest and left hip 30 Gy 12 Fx  . URI (upper respiratory infection) 02-02-2015    ALLERGIES:  is allergic to other and doxycycline.  MEDICATIONS:  Current Outpatient Prescriptions  Medication Sig Dispense Refill  . benzonatate (TESSALON) 100 MG capsule Take 1 capsule (100 mg total) by mouth every 8 (eight) hours as needed. 20 capsule 0  . chlorpheniramine-HYDROcodone (TUSSIONEX) 10-8 MG/5ML SUER Take 5 mLs by mouth every 12 (twelve) hours as needed. 140 mL 0  . clindamycin (CLINDAGEL) 1 % gel Apply 1 application topically 2 (two) times daily.    . Cyanocobalamin (VITAMIN B-12) 2500 MCG SUBL Place 2,500 mcg under the tongue daily.    Marland Kitchen Dextromethorphan Polistirex (DELSYM PO) Take by mouth.    . ENSURE (ENSURE) Take 1 Can by mouth 2 (two) times daily between meals.     . ferrous sulfate 325 (65 FE) MG EC tablet Take 325 mg by mouth daily with breakfast.    . fluticasone (VERAMYST) 27.5 MCG/SPRAY nasal spray Place 2 sprays into the nose daily.    . folic acid (FOLVITE) 937 MCG tablet Take 1 tablet (800 mcg total) by mouth daily. 30 tablet 1  . loratadine (CLARITIN) 10 MG tablet Take 1 tablet (10 mg total) by mouth daily. 30 tablet 0  . Nutritional Supplements (JUICE PLUS FIBRE PO) Take 1 capsule by mouth daily. Reported on 07/28/2015    . osimertinib mesylate (TAGRISSO) 80 MG tablet Take 1 tablet (80 mg total) by mouth  daily. 30 tablet 2  . OVER THE COUNTER MEDICATION "Deep penetrating pain relief oil"    . ranitidine (ZANTAC) 300 MG tablet Take 1 tablet (300 mg total) by mouth at bedtime. 30 tablet 6  . TURMERIC PO Take 1 capsule by mouth 2 (two) times daily. Turmeric Superior 750 mg po bid     No current facility-administered medications for this visit.     SURGICAL HISTORY:  Past Surgical History:  Procedure Laterality Date  . IR GENERIC HISTORICAL  07/14/2015   IR RADIOLOGIST EVAL & MGMT 07/14/2015 Greggory Keen, MD [facility] INTERV RAD  . VIDEO BRONCHOSCOPY Bilateral 08/09/2014   Procedure: VIDEO BRONCHOSCOPY WITHOUT FLUORO;  Surgeon: [name], MD;  Location: [facility] ENDOSCOPY;  Service: Cardiopulmonary;  Laterality: Bilateral;  . VIDEO BRONCHOSCOPY Bilateral 07/06/2015   Procedure: VIDEO BRONCHOSCOPY WITHOUT FLUORO;  Surgeon: [name], MD;  Location: [facility] ENDOSCOPY;  Service: Cardiopulmonary;  Laterality: Bilateral;    REVIEW OF SYSTEMS:  A comprehensive review of systems was negative except for: Gastrointestinal: positive for diarrhea   PHYSICAL EXAMINATION: General appearance: alert, cooperative and no distress Head: Normocephalic, without obvious abnormality, atraumatic Neck: no adenopathy, no JVD, supple, symmetrical, trachea midline and thyroid not enlarged, symmetric, no tenderness/mass/nodules Lymph nodes: Cervical, supraclavicular, and axillary nodes normal. Resp: clear to auscultation bilaterally Back: symmetric, no curvature. ROM normal. No CVA tenderness. Cardio: regular rate and rhythm, S1, S2 normal, no murmur, click, rub or gallop GI: soft, non-tender; bowel sounds normal; no masses,  no organomegaly Extremities: extremities normal, atraumatic, no cyanosis or edema  ECOG PERFORMANCE STATUS: 1 - Symptomatic but completely ambulatory  Blood pressure 96/60, pulse 98, temperature 98 F (36.7 C), temperature source Oral, resp. rate 18, height _0  (1.499 m), weight 109 lb 14.4 oz (49.9 kg), SpO2 100 %.  LABORATORY DATA: Lab Results  Component Value Date   WBC 7.2 06/26/2016   HGB 14.1 06/26/2016   HCT 41.9 06/26/2016   MCV 95.0 06/26/2016   PLT 339 06/26/2016      Chemistry      Component Value Date/Time   NA 139 06/26/2016 0944   K 4.2 06/26/2016 0944   CL 109 07/19/2015 0509   CO2 28 06/26/2016 0944   BUN 14.1 06/26/2016 0944   CREATININE 0.7 06/26/2016 0944      Component Value Date/Time   CALCIUM 9.1 06/26/2016 0944   ALKPHOS 47 06/26/2016 0944    AST 17 06/26/2016 0944   ALT 14 06/26/2016 0944   BILITOT 0.37 06/26/2016 0944       RADIOGRAPHIC STUDIES: Ct Chest W Contrast  Result Date: 05/28/2016 CLINICAL DATA:  Right lung cancer with ongoing oral chemotherapy. Cough, low back pain and abdominal pain. EXAM: CT CHEST, ABDOMEN, AND PELVIS WITH CONTRAST TECHNIQUE: Multidetector CT imaging of the chest, abdomen and pelvis was performed following the standard protocol during bolus administration of intravenous contrast. CONTRAST:  <See Chart> ISOVUE-300 IOPAMIDOL (ISOVUE-300) INJECTION 61% COMPARISON:  CT chest 03/02/2016 and CT abdomen pelvis 11/22/2015. FINDINGS: CT CHEST FINDINGS Cardiovascular: Vascular structures are unremarkable. Heart size normal. No pericardial effusion. Mediastinum/Nodes: Soft tissue in the prevascular space conforms to the shape of the thymus. No pathologically enlarged mediastinal, hilar or axillary lymph nodes. Esophagus is grossly unremarkable. Lungs/Pleura: Scattered pulmonary nodules measure 3 mm or less in size and are new or enlarging. Posttreatment collapse/ consolidation and bronchiectasis in the posteromedial right hemithorax. Trace right pleural fluid. No left pleural fluid. Airway is otherwise unremarkable. Musculoskeletal: Sclerotic lesions are  seen in the proximal right humerus, right third rib and manubrium, as before. CT ABDOMEN PELVIS FINDINGS Hepatobiliary: Liver and gallbladder are unremarkable. Common bile duct measures up to 1.4 cm, as before. Mild intrahepatic biliary ductal dilatation, similar. Pancreas: Negative. Spleen: Negative. Adrenals/Urinary Tract: Right adrenal gland is unremarkable. Low-attenuation lesion in the lateral limb left adrenal gland measures 0.9 x 2.6 cm, previously 0.8 x 1.3 cm on 11/22/2015. Kidneys are unremarkable. Ureters are decompressed. Bladder is low in volume. Stomach/Bowel: Stomach, small bowel, appendix and colon are unremarkable. Vascular/Lymphatic: Vascular structures are  unremarkable. Abdominal retroperitoneal lymph nodes measure up to 11 mm in the left periaortic station (series 2, image 64), previously 5 mm. Reproductive: Uterus is visualized. Probable nabothian cysts incidentally noted. No adnexal mass. Other: No free fluid.  Mesenteries peritoneum are unremarkable. Musculoskeletal: Mixed lytic and sclerotic lesions throughout the visualized osseous structures are grossly stable. IMPRESSION: 1. New and enlarging bilateral pulmonary nodules, worrisome metastatic disease. 2. New abdominal retroperitoneal adenopathy, indicative of metastatic disease. 3. Enlarging left adrenal metastasis. 4. Osseous metastatic disease, grossly stable. Electronically Signed   By: Lorin Picket M.D.   On: 05/28/2016 09:47   Ct Abdomen Pelvis W Contrast  Result Date: 05/28/2016 CLINICAL DATA:  Right lung cancer with ongoing oral chemotherapy. Cough, low back pain and abdominal pain. EXAM: CT CHEST, ABDOMEN, AND PELVIS WITH CONTRAST TECHNIQUE: Multidetector CT imaging of the chest, abdomen and pelvis was performed following the standard protocol during bolus administration of intravenous contrast. CONTRAST:  <See Chart> ISOVUE-300 IOPAMIDOL (ISOVUE-300) INJECTION 61% COMPARISON:  CT chest 03/02/2016 and CT abdomen pelvis 11/22/2015. FINDINGS: CT CHEST FINDINGS Cardiovascular: Vascular structures are unremarkable. Heart size normal. No pericardial effusion. Mediastinum/Nodes: Soft tissue in the prevascular space conforms to the shape of the thymus. No pathologically enlarged mediastinal, hilar or axillary lymph nodes. Esophagus is grossly unremarkable. Lungs/Pleura: Scattered pulmonary nodules measure 3 mm or less in size and are new or enlarging. Posttreatment collapse/ consolidation and bronchiectasis in the posteromedial right hemithorax. Trace right pleural fluid. No left pleural fluid. Airway is otherwise unremarkable. Musculoskeletal: Sclerotic lesions are seen in the proximal right humerus,  right third rib and manubrium, as before. CT ABDOMEN PELVIS FINDINGS Hepatobiliary: Liver and gallbladder are unremarkable. Common bile duct measures up to 1.4 cm, as before. Mild intrahepatic biliary ductal dilatation, similar. Pancreas: Negative. Spleen: Negative. Adrenals/Urinary Tract: Right adrenal gland is unremarkable. Low-attenuation lesion in the lateral limb left adrenal gland measures 0.9 x 2.6 cm, previously 0.8 x 1.3 cm on 11/22/2015. Kidneys are unremarkable. Ureters are decompressed. Bladder is low in volume. Stomach/Bowel: Stomach, small bowel, appendix and colon are unremarkable. Vascular/Lymphatic: Vascular structures are unremarkable. Abdominal retroperitoneal lymph nodes measure up to 11 mm in the left periaortic station (series 2, image 64), previously 5 mm. Reproductive: Uterus is visualized. Probable nabothian cysts incidentally noted. No adnexal mass. Other: No free fluid.  Mesenteries peritoneum are unremarkable. Musculoskeletal: Mixed lytic and sclerotic lesions throughout the visualized osseous structures are grossly stable. IMPRESSION: 1. New and enlarging bilateral pulmonary nodules, worrisome metastatic disease. 2. New abdominal retroperitoneal adenopathy, indicative of metastatic disease. 3. Enlarging left adrenal metastasis. 4. Osseous metastatic disease, grossly stable. Electronically Signed   By: Lorin Picket M.D.   On: 05/28/2016 09:47    ASSESSMENT AND PLAN:  This is a very pleasant 46 years old Asian female with stage IV non-small cell lung cancer, adenocarcinoma with positive EGFR mutation with deletion in exon 41 diagnosed in July 2016 status post treatment with Tarceva  for a total of 18 months discontinued secondary to disease progression and development of T790M resistant mutation. The patient was started last month on treatment with Tagrisso 80 mg by mouth daily and has been tolerating this treatment fairly well. Her lab work is unremarkable today. I recommended  for the patient to continue her current treatment with Tagrisso with the same dose. I will see her back for follow-up visit in one month's for reevaluation with repeat blood work. She was advised to call immediately if she has any concerning symptoms in the interval. The patient voices understanding of current disease status and treatment options and is in agreement with the current care plan. All questions were answered. The patient knows to call the clinic with any problems, questions or concerns. We can certainly see the patient much sooner if necessary. I spent 10 minutes counseling the patient face to face. The total time spent in the appointment was 15 minutes.  Disclaimer: This note was dictated with voice recognition software. Similar sounding words can inadvertently be transcribed and may not be corrected upon review.

## 2016-06-26 NOTE — Telephone Encounter (Signed)
Gave patient avs report and appointments for June thru December

## 2016-07-05 ENCOUNTER — Ambulatory Visit (HOSPITAL_BASED_OUTPATIENT_CLINIC_OR_DEPARTMENT_OTHER): Payer: Medicaid Other

## 2016-07-05 ENCOUNTER — Other Ambulatory Visit (HOSPITAL_BASED_OUTPATIENT_CLINIC_OR_DEPARTMENT_OTHER): Payer: Medicaid Other

## 2016-07-05 ENCOUNTER — Other Ambulatory Visit: Payer: Self-pay | Admitting: *Deleted

## 2016-07-05 VITALS — BP 91/55 | HR 92 | Temp 97.4°F | Resp 18

## 2016-07-05 DIAGNOSIS — M9080 Osteopathy in diseases classified elsewhere, unspecified site: Secondary | ICD-10-CM

## 2016-07-05 DIAGNOSIS — C342 Malignant neoplasm of middle lobe, bronchus or lung: Secondary | ICD-10-CM

## 2016-07-05 DIAGNOSIS — E889 Metabolic disorder, unspecified: Secondary | ICD-10-CM

## 2016-07-05 DIAGNOSIS — C3431 Malignant neoplasm of lower lobe, right bronchus or lung: Secondary | ICD-10-CM

## 2016-07-05 DIAGNOSIS — C3491 Malignant neoplasm of unspecified part of right bronchus or lung: Secondary | ICD-10-CM

## 2016-07-05 DIAGNOSIS — M898X9 Other specified disorders of bone, unspecified site: Secondary | ICD-10-CM

## 2016-07-05 DIAGNOSIS — C7951 Secondary malignant neoplasm of bone: Secondary | ICD-10-CM | POA: Diagnosis not present

## 2016-07-05 LAB — COMPREHENSIVE METABOLIC PANEL
ALBUMIN: 3.6 g/dL (ref 3.5–5.0)
ALT: 19 U/L (ref 0–55)
ANION GAP: 8 meq/L (ref 3–11)
AST: 17 U/L (ref 5–34)
Alkaline Phosphatase: 50 U/L (ref 40–150)
BUN: 14.2 mg/dL (ref 7.0–26.0)
CALCIUM: 8.9 mg/dL (ref 8.4–10.4)
CO2: 26 mEq/L (ref 22–29)
CREATININE: 0.7 mg/dL (ref 0.6–1.1)
Chloride: 105 mEq/L (ref 98–109)
EGFR: >90 mL/min/{1.73_m2} (ref >90)
Glucose: 80 mg/dl (ref 70–140)
Potassium: 4 mEq/L (ref 3.5–5.1)
Sodium: 138 mEq/L (ref 136–145)
TOTAL PROTEIN: 7.1 g/dL (ref 6.4–8.3)
Total Bilirubin: 0.45 mg/dL (ref 0.20–1.20)

## 2016-07-05 LAB — CBC WITH DIFFERENTIAL/PLATELET
BASO%: 0.2 % (ref 0.0–2.0)
Basophils Absolute: 0 10*3/uL (ref 0.0–0.1)
EOS%: 0.8 % (ref 0.0–7.0)
Eosinophils Absolute: 0.1 10*3/uL (ref 0.0–0.5)
HCT: 41.7 % (ref 34.8–46.6)
HGB: 14.1 g/dL (ref 11.6–15.9)
LYMPH%: 11.3 % — ABNORMAL LOW (ref 14.0–49.7)
MCH: 32.2 pg (ref 25.1–34.0)
MCHC: 33.7 g/dL (ref 31.5–36.0)
MCV: 95.6 fL (ref 79.5–101.0)
MONO#: 0.7 10*3/uL (ref 0.1–0.9)
MONO%: 7.4 % (ref 0.0–14.0)
NEUT%: 80.3 % — AB (ref 38.4–76.8)
NEUTROS ABS: 8 10*3/uL — AB (ref 1.5–6.5)
PLATELETS: 307 10*3/uL (ref 145–400)
RBC: 4.36 10*6/uL (ref 3.70–5.45)
RDW: 12.9 % (ref 11.2–14.5)
WBC: 10 10*3/uL (ref 3.9–10.3)
lymph#: 1.1 10*3/uL (ref 0.9–3.3)

## 2016-07-05 MED ORDER — AMBULATORY NON FORMULARY MEDICATION: 3 refills | Status: DC

## 2016-07-05 MED ORDER — DENOSUMAB 120 MG/1.7ML ~~LOC~~ SOLN
120.0000 mg | Freq: Once | SUBCUTANEOUS | Status: AC
  Administered 2016-07-05: 120 mg via SUBCUTANEOUS
  Filled 2016-07-05: qty 1.7

## 2016-07-05 MED FILL — MAGIC MW LID/MAAL/DP1:1:1: 2 | 2 days supply | Qty: 240 | Fill #0

## 2016-07-23 ENCOUNTER — Encounter: Payer: Self-pay | Admitting: Internal Medicine

## 2016-07-23 ENCOUNTER — Telehealth: Payer: Self-pay | Admitting: Pharmacist

## 2016-07-23 NOTE — Telephone Encounter (Signed)
Oral Chemotherapy Pharmacist Encounter  Received notification from social work that patient has lost Medicaid and needs assistance obtaining Tagrisso as it is prohibitively expensive for the patient without prescription insurance coverage.  I met patient in [facility] lobby to complete enrollment application for [employer] patient assistance application to try to obtain Tagrisso at $0 out of pocket cost from the manufacturer [employer]).  Completed application faxed to [employer] at [phone]  This encounter will continue to be updated until final determination.  Oral Oncology Clinic will continue to follow.   Johny Drilling, PharmD, BCPS, BCOP 07/23/2016  10:58 AM Oral Oncology Clinic [phone]

## 2016-07-23 NOTE — Progress Notes (Signed)
Patient referred by social worker to ask about hospital financial assistance and how long it last and inquired about Tagrisso which she states she will run out of.  Checked [place] Passport and shows patient not eligible for July. May and June showed active.  Reached out to Annie Main in Therapist, art to get update on [facility] FAA status. Per Annie Main, it shows she has Medicaid and she did not have a balance for financial assistance.  [place] with patient present to inquire about Medicaid. She states she has not received a letter with a caseworker name and number. Was transferred to a supervisor Governor Rooks. I left a voicemail for her to return my call along with patient contact information.  Advised patient to see me tomorrow after her doctor appointment to discuss details about Medicaid if she or I receive a call back from supervisor. She verbalized understanding and asked what she should tell registration about her Medicaid. Advised her to tell them that it is being worked on. She verbalized understanding.   She has my card for any additional financial questions or concerns.

## 2016-07-24 ENCOUNTER — Ambulatory Visit (HOSPITAL_BASED_OUTPATIENT_CLINIC_OR_DEPARTMENT_OTHER): Payer: Medicaid Other | Admitting: Internal Medicine

## 2016-07-24 ENCOUNTER — Other Ambulatory Visit (HOSPITAL_BASED_OUTPATIENT_CLINIC_OR_DEPARTMENT_OTHER): Payer: Medicaid Other

## 2016-07-24 ENCOUNTER — Encounter: Payer: Self-pay | Admitting: Internal Medicine

## 2016-07-24 ENCOUNTER — Telehealth: Payer: Self-pay | Admitting: Internal Medicine

## 2016-07-24 VITALS — BP 97/54 | HR 103 | Temp 98.6°F | Resp 20 | Ht 59.0 in | Wt 110.1 lb

## 2016-07-24 DIAGNOSIS — C342 Malignant neoplasm of middle lobe, bronchus or lung: Secondary | ICD-10-CM

## 2016-07-24 DIAGNOSIS — C7951 Secondary malignant neoplasm of bone: Secondary | ICD-10-CM

## 2016-07-24 DIAGNOSIS — C7972 Secondary malignant neoplasm of left adrenal gland: Secondary | ICD-10-CM

## 2016-07-24 DIAGNOSIS — Z5111 Encounter for antineoplastic chemotherapy: Secondary | ICD-10-CM

## 2016-07-24 DIAGNOSIS — C3491 Malignant neoplasm of unspecified part of right bronchus or lung: Secondary | ICD-10-CM

## 2016-07-24 DIAGNOSIS — C7931 Secondary malignant neoplasm of brain: Secondary | ICD-10-CM | POA: Diagnosis not present

## 2016-07-24 LAB — COMPREHENSIVE METABOLIC PANEL
ALT: 16 U/L (ref 0–55)
ANION GAP: 8 meq/L (ref 3–11)
AST: 17 U/L (ref 5–34)
Albumin: 3.7 g/dL (ref 3.5–5.0)
Alkaline Phosphatase: 51 U/L (ref 40–150)
BUN: 15.7 mg/dL (ref 7.0–26.0)
CALCIUM: 8.8 mg/dL (ref 8.4–10.4)
CHLORIDE: 104 meq/L (ref 98–109)
CO2: 26 meq/L (ref 22–29)
CREATININE: 0.7 mg/dL (ref 0.6–1.1)
Glucose: 85 mg/dl (ref 70–140)
POTASSIUM: 4 meq/L (ref 3.5–5.1)
Sodium: 138 mEq/L (ref 136–145)
Total Bilirubin: 0.36 mg/dL (ref 0.20–1.20)
Total Protein: 7.2 g/dL (ref 6.4–8.3)

## 2016-07-24 LAB — CBC WITH DIFFERENTIAL/PLATELET
BASO%: 0.5 % (ref 0.0–2.0)
BASOS ABS: 0 10*3/uL (ref 0.0–0.1)
EOS%: 1.7 % (ref 0.0–7.0)
Eosinophils Absolute: 0.1 10*3/uL (ref 0.0–0.5)
HEMATOCRIT: 42.9 % (ref 34.8–46.6)
HGB: 14.7 g/dL (ref 11.6–15.9)
LYMPH%: 18.1 % (ref 14.0–49.7)
MCH: 32.5 pg (ref 25.1–34.0)
MCHC: 34.2 g/dL (ref 31.5–36.0)
MCV: 95 fL (ref 79.5–101.0)
MONO#: 0.5 10*3/uL (ref 0.1–0.9)
MONO%: 8.2 % (ref 0.0–14.0)
NEUT#: 4 10*3/uL (ref 1.5–6.5)
NEUT%: 71.5 % (ref 38.4–76.8)
PLATELETS: 319 10*3/uL (ref 145–400)
RBC: 4.51 10*6/uL (ref 3.70–5.45)
RDW: 13 % (ref 11.2–14.5)
WBC: 5.5 10*3/uL (ref 3.9–10.3)
lymph#: 1 10*3/uL (ref 0.9–3.3)

## 2016-07-24 NOTE — Progress Notes (Signed)
[place] Telephone:([phone]   Fax:(336) [phone]  OFFICE PROGRESS NOTE  [name], MD [place] [address]  DIAGNOSIS: Stage IV (T2a, N1, M1b) non-small cell lung cancer, adenocarcinoma with positive EGFR mutation (deletion 73) presented with right middle lobe lung mass, right hilar adenopathy as well as metastatic disease to the bone, brain and left adrenal diagnosed in July 2016.  PRIOR THERAPY: 1) palliative radiotherapy to the brain as well as metastatic bone lesions in the pelvis. 2) Tarceva 150 mg by mouth daily started on 09/25/2014. Status post 5 months of treatment. 3) status post palliative radiotherapy to the right middle lobe lung mass for treatment of hemoptysis. 4) Tarceva 100 mg by mouth daily started 02/19/2015 status post 13 months of treatment. This was discontinued in May 2018 after the patient had disease progression and development of T790M resistant mutation.   CURRENT THERAPY: 1) Tagrisso 80 mg by mouth daily started 06/02/2016. 2) Xgeva 120 g subcutaneously on monthly basis.  INTERVAL HISTORY: Deborah Foster 46 y.o. female returns to the clinic today for follow-up visit accompanied by her interpreter. The patient is feeling fine today with no specific complaints. She is tolerating her current treatment with Tagrisso fairly well. She is concerned because she is running out of her medication and she has not received her new refill yet due to change in her insurance. The patient denied having any significant skin rash or diarrhea. She denied having any chest pain, shortness of breath, cough or hemoptysis. She has no fever or chills. She has no nausea, vomiting, diarrhea or constipation. She is here today for evaluation and repeat blood work.  MEDICAL HISTORY: Past Medical History:  Diagnosis Date  . Bone metastasis ([place])   . Hemoptysis   . Hypokalemia   . lung ca dx'd 07/2014  . Lung mass   . Metastasis to  adrenal gland ([place])   . Metastasis to brain ([place])   . Pneumonia   . Radiation 08/23/14-09/07/14   Brain/chest and left hip 30 Gy 12 Fx  . URI (upper respiratory infection) 02-02-2015    ALLERGIES:  is allergic to other and doxycycline.  MEDICATIONS:  Current Outpatient Prescriptions  Medication Sig Dispense Refill  . AMBULATORY NON FORMULARY MEDICATION Medication Name: MAGIC MOUTHWASH 2 % Viscous Lidocaine  Maalox Benadryl Susp.  Disp: 1:1:1 29m bottle Sig: 175mPO Swish & Spit  q 3-4hrs. PRN mouth sores 200 mL 3  . benzonatate (TESSALON) 100 MG capsule Take 1 capsule (100 mg total) by mouth every 8 (eight) hours as needed. 20 capsule 0  . chlorpheniramine-HYDROcodone (TUSSIONEX) 10-8 MG/5ML SUER Take 5 mLs by mouth every 12 (twelve) hours as needed. (Patient not taking: Reported on 06/26/2016) 140 mL 0  . clindamycin (CLINDAGEL) 1 % gel Apply 1 application topically 2 (two) times daily.    . Cyanocobalamin (VITAMIN B-12) 2500 MCG SUBL Place 2,500 mcg under the tongue daily.    . Marland Kitchenextromethorphan Polistirex (DELSYM PO) Take by mouth.    . ENSURE (ENSURE) Take 1 Can by mouth 2 (two) times daily between meals.     . ferrous sulfate 325 (65 FE) MG EC tablet Take 325 mg by mouth daily with breakfast.    . fluticasone (VERAMYST) 27.5 MCG/SPRAY nasal spray Place 2 sprays into the nose daily.    . folic acid (FOLVITE) 80623CG tablet Take 1 tablet (800 mcg total) by mouth daily. 30 tablet 1  . loratadine (  CLARITIN) 10 MG tablet Take 1 tablet (10 mg total) by mouth daily. 30 tablet 0  . Nutritional Supplements (JUICE PLUS FIBRE PO) Take 1 capsule by mouth daily. Reported on 07/28/2015    . osimertinib mesylate (TAGRISSO) 80 MG tablet Take 1 tablet (80 mg total) by mouth daily. 30 tablet 2  . OVER THE COUNTER MEDICATION "Deep penetrating pain relief oil"    . ranitidine (ZANTAC) 300 MG tablet Take 1 tablet (300 mg total) by mouth at bedtime. 30 tablet 6  . TURMERIC PO Take 1 capsule by mouth 2  (two) times daily. Turmeric Superior 750 mg po bid     No current facility-administered medications for this visit.     SURGICAL HISTORY:  Past Surgical History:  Procedure Laterality Date  . IR GENERIC HISTORICAL  07/14/2015   IR RADIOLOGIST EVAL & MGMT 07/14/2015 Greggory Keen, MD [facility] INTERV RAD  . VIDEO BRONCHOSCOPY Bilateral 08/09/2014   Procedure: VIDEO BRONCHOSCOPY WITHOUT FLUORO;  Surgeon: [name], MD;  Location: [facility] ENDOSCOPY;  Service: Cardiopulmonary;  Laterality: Bilateral;  . VIDEO BRONCHOSCOPY Bilateral 07/06/2015   Procedure: VIDEO BRONCHOSCOPY WITHOUT FLUORO;  Surgeon: [name], MD;  Location: [facility] ENDOSCOPY;  Service: Cardiopulmonary;  Laterality: Bilateral;    REVIEW OF SYSTEMS:  A comprehensive review of systems was negative.   PHYSICAL EXAMINATION: General appearance: alert, cooperative and no distress Head: Normocephalic, without obvious abnormality, atraumatic Neck: no adenopathy, no JVD, supple, symmetrical, trachea midline and thyroid not enlarged, symmetric, no tenderness/mass/nodules Lymph nodes: Cervical, supraclavicular, and axillary nodes normal. Resp: clear to auscultation bilaterally Back: symmetric, no curvature. ROM normal. No CVA tenderness. Cardio: regular rate and rhythm, S1, S2 normal, no murmur, click, rub or gallop GI: soft, non-tender; bowel sounds normal; no masses,  no organomegaly Extremities: extremities normal, atraumatic, no cyanosis or edema  ECOG PERFORMANCE STATUS: 1 - Symptomatic but completely ambulatory  Blood pressure (!) 97/54, pulse (!) 103, temperature 98.6 F (37 C), temperature source Oral, resp. rate 20, height '4\' 11"'$  (1.499 m), weight 110 lb 1.6 oz (49.9 kg), SpO2 99 %.  LABORATORY DATA: Lab Results  Component Value Date   WBC 5.5 07/24/2016   HGB 14.7 07/24/2016   HCT 42.9 07/24/2016   MCV 95.0 07/24/2016   PLT 319 07/24/2016      Chemistry      Component Value Date/Time   NA 138 07/05/2016 0959     K 4.0 07/05/2016 0959   CL 109 07/19/2015 0509   CO2 26 07/05/2016 0959   BUN 14.2 07/05/2016 0959   CREATININE 0.7 07/05/2016 0959      Component Value Date/Time   CALCIUM 8.9 07/05/2016 0959   ALKPHOS 50 07/05/2016 0959   AST 17 07/05/2016 0959   ALT 19 07/05/2016 0959   BILITOT 0.45 07/05/2016 0959       RADIOGRAPHIC STUDIES: No results found.  ASSESSMENT AND PLAN:  This is a very pleasant 46 years old Asian female with stage IV non-small cell lung cancer, adenocarcinoma with positive EGFR mutation with deletion in exon 22 diagnosed in July 2016 status post treatment with Tarceva for a total of 18 months but this was discontinued secondary to disease progression and development of T790M resistant mutation. The patient is currently on treatment with Tagrisso 80 mg by mouth daily status post 6 weeks of treatment and has been tolerating this treatment fairly well with no significant adverse effects. I recommended for the patient to continue her treatment as a scheduled. Hopefully she  will receive her refill in the next few days. I will see her back for follow-up visit in 6 weeks for evaluation after repeating CT scan of the chest, abdomen and pelvis for restaging of her disease. For the metastatic bone disease, she will continue her current treatment with Xgeva. The patient was advised to call immediately if she has any concerning symptoms in the interval. The patient voices understanding of current disease status and treatment options and is in agreement with the current care plan. All questions were answered. The patient knows to call the clinic with any problems, questions or concerns. We can certainly see the patient much sooner if necessary. I spent 10 minutes counseling the patient face to face. The total time spent in the appointment was 15 minutes.  Disclaimer: This note was dictated with voice recognition software. Similar sounding words can inadvertently be transcribed and  may not be corrected upon review.

## 2016-07-24 NOTE — Telephone Encounter (Signed)
Scheduled f/u per 7/10 los - lab already scheduled - [facility] to contact patient with scan schedule.

## 2016-07-24 NOTE — Progress Notes (Signed)
Patient came to follow up on Medicaid status from yesterday. Advised her I received a voicemail from [place] stating it was not active and she was over reserved. Attempted to reach her back with no success.  Attempted to reach [facility] back today and received her voicemail. Emailed her the concern and waiting for a response so I can inform patient what she needs to do next whether to reapply, submit information, or submit bills. My contact information is included in the email. Patient verbalized understanding.

## 2016-07-31 IMAGING — MR MR HEAD WO/W CM
10 of 13 series · 33 of 48 positions shown · IV contrast (multihance)
Comparison: MRI head 08/16/2014

CLINICAL DATA: Lung cancer.  Follow-up brain metastasis.

EXAM:
MRI HEAD WITHOUT AND WITH CONTRAST
TECHNIQUE: Multiplanar, multiecho pulse sequences of the brain and surrounding
structures were obtained without and with intravenous contrast.
CONTRAST:  9mL MULTIHANCE GADOBENATE DIMEGLUMINE 529 MG/ML IV SOLN

[Series 3: T1 · sagittal · 5.0mm · 0.47mm/px · 2 of 24 slices shown]
[im 1/24]
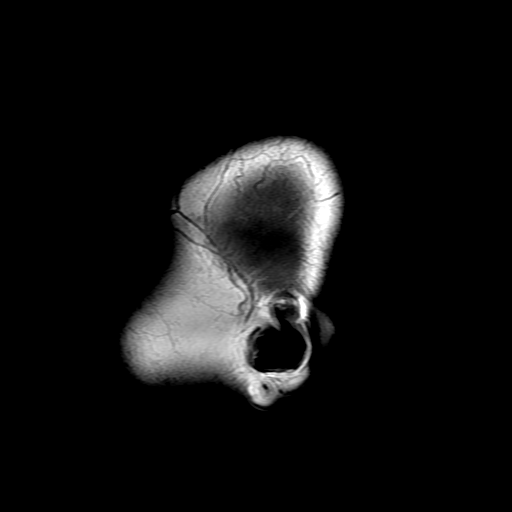
[im 12/24]
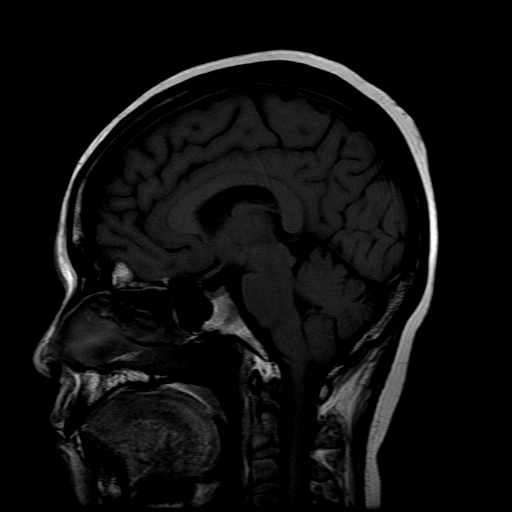

[Series 4: DWI · axial · 3.0mm · 1.09mm/px · z∈[-44,+101]mm · 8 of 100 slices shown (1 of 4)]
[im 1/100]
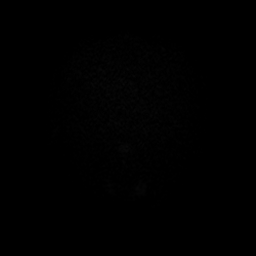
[im 12/100]
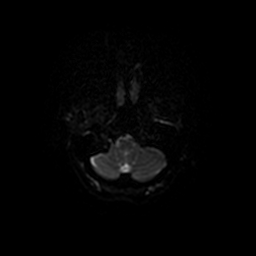
[im 34/100]
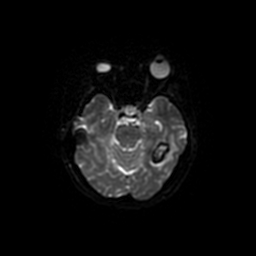
[im 45/100]
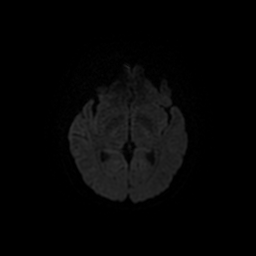
[im 56/100]
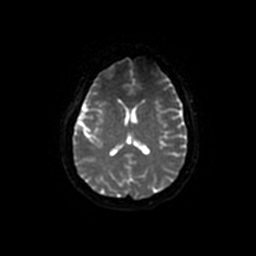
[im 67/100]
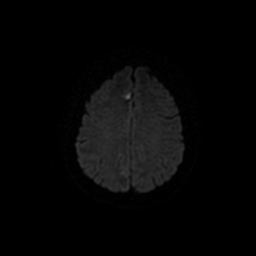
[im 89/100]
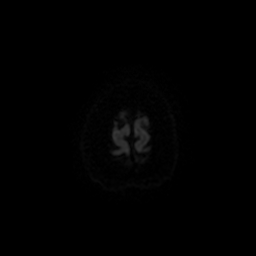
[im 100/100]
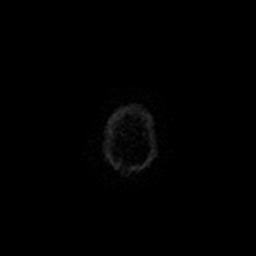

[Series 5: DWI · coronal · 5.0mm · 1.09mm/px · 6 of 72 slices shown (2 of 4)]
[im 1/72]
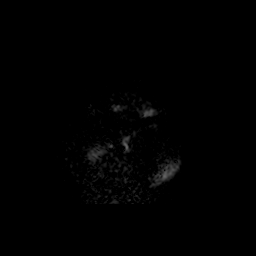
[im 15/72]
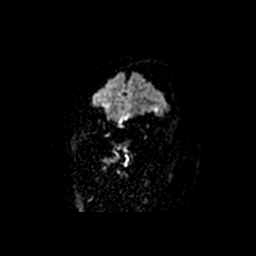
[im 29/72]
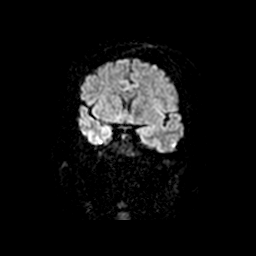
[im 43/72]
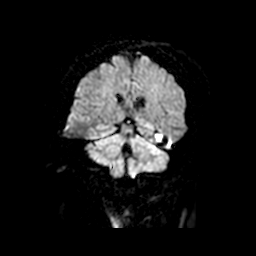
[im 57/72]
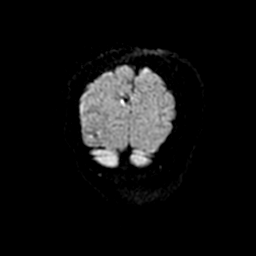
[im 72/72]
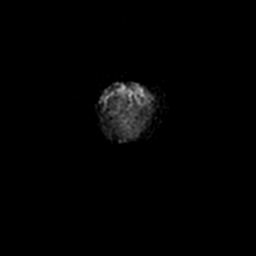

[Series 6: T2 · axial · 5.0mm · 0.43mm/px · z∈[-57,+88]mm · 2 of 24 slices shown]
[im 1/24]
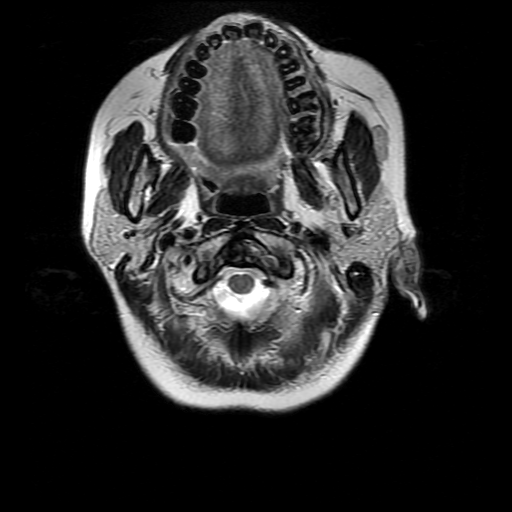
[im 24/24]
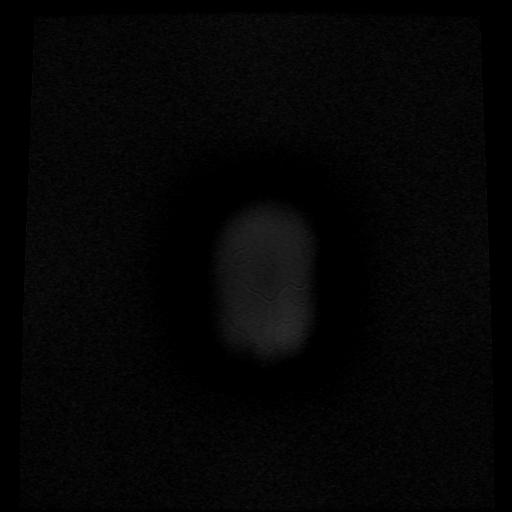

[Series 7: FLAIR · axial · 5.0mm · 0.43mm/px · z∈[-63,+93]mm · 2 of 24 slices shown]
[im 1/24]
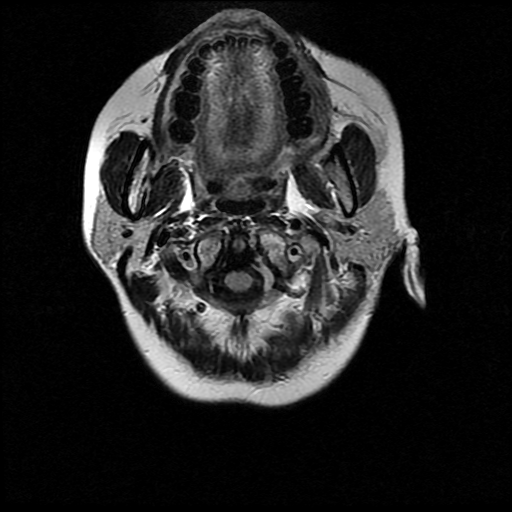
[im 24/24]
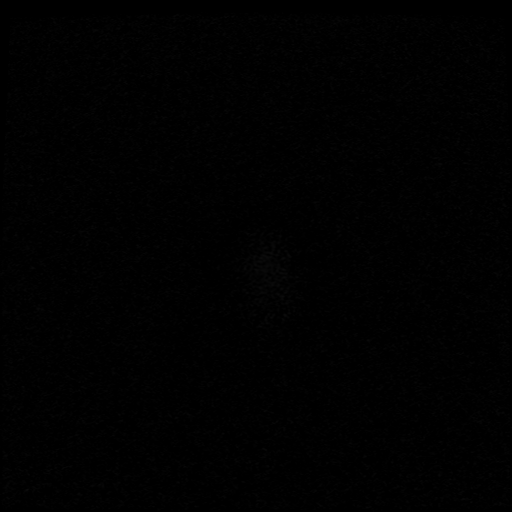

[Series 10: T2 post-contrast · coronal · 5.0mm · 0.45mm/px · 2 of 25 slices shown]
[im 1/25]
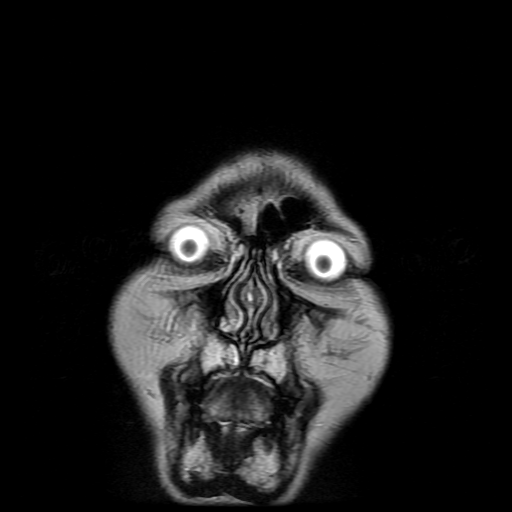
[im 25/25]
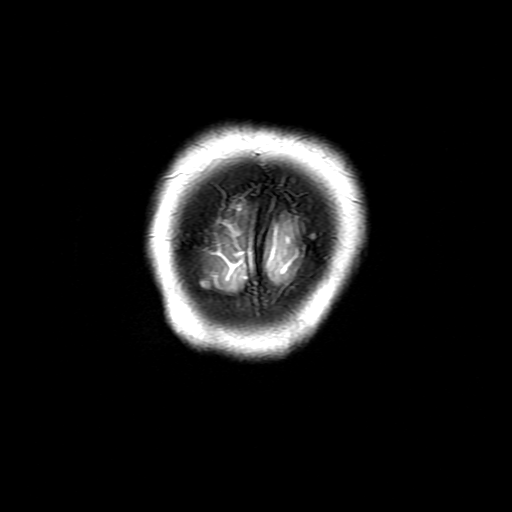

[Series 12: T1 post-contrast · coronal · 5.0mm · 0.45mm/px · 2 of 25 slices shown (1 of 2)]
[im 1/25]
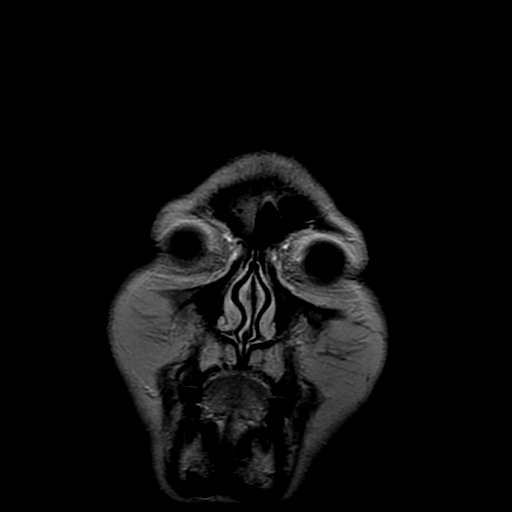
[im 25/25]
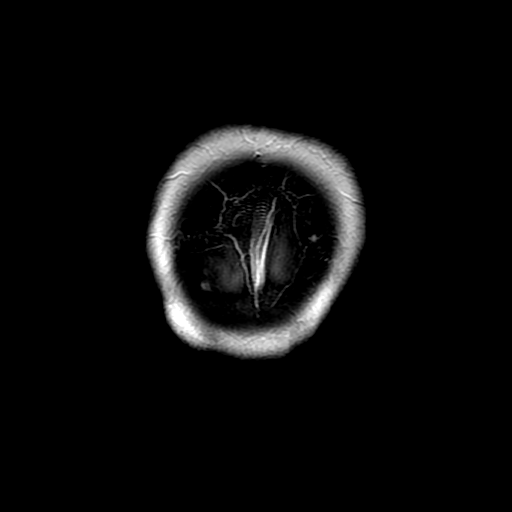

[Series 13: T1 post-contrast · sagittal · 5.0mm · 0.47mm/px · 2 of 24 slices shown (2 of 2)]
[im 1/24]
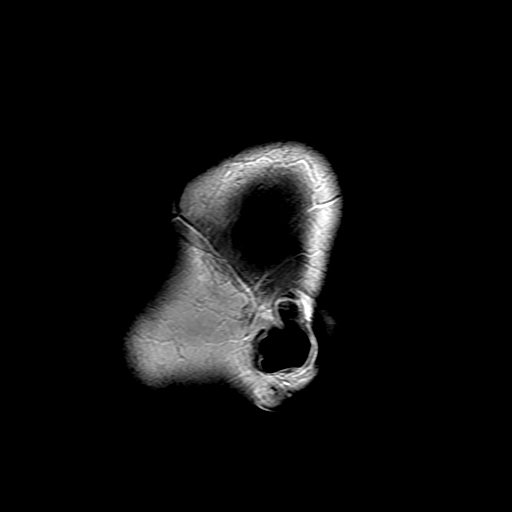
[im 24/24]
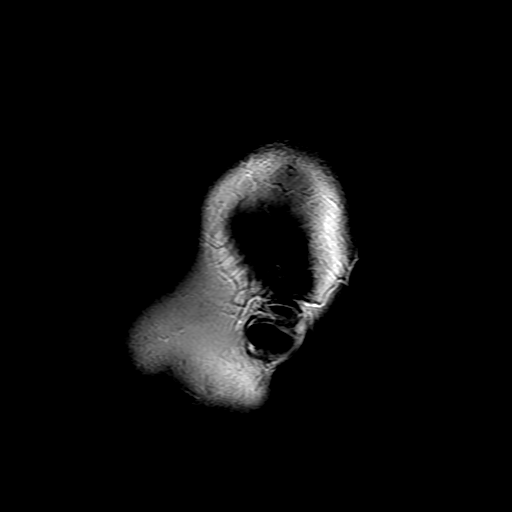

[Series 400: DWI · axial · 3.0mm · 1.09mm/px · z∈[-44,+101]mm · 4 of 50 slices shown (3 of 4)]
[im 1/50]
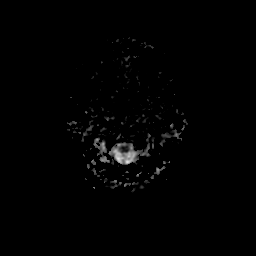
[im 17/50]
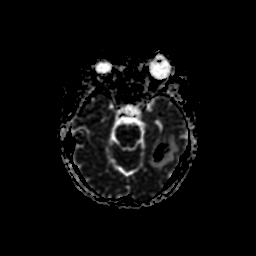
[im 33/50]
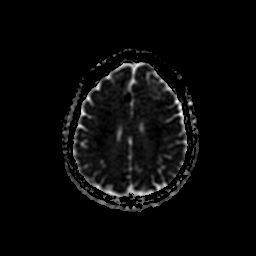
[im 50/50]
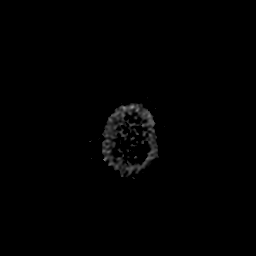

[Series 500: DWI · coronal · 5.0mm · 1.09mm/px · 3 of 36 slices shown (4 of 4)]
[im 1/36]
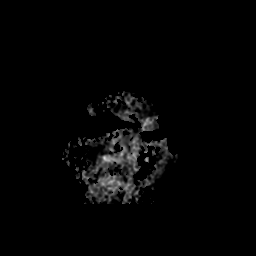
[im 18/36]
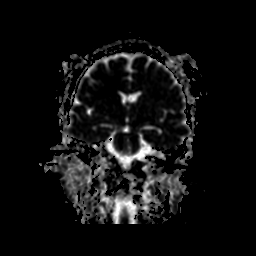
[im 36/36]
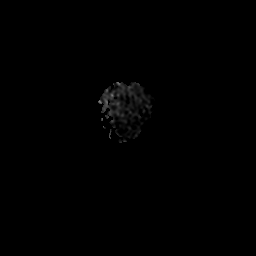

[33 of 48 positions shown; findings below may reference images not displayed]

FINDINGS: Interval improvement in multiple metastatic deposits in the brain.
Most of these show evidence of prior hemorrhage as noted previously.

The largest lesion previously was in the left posterior temporal
lobe. This currently measures 20 x 11 mm and previously measured 35
x 24 mm. Hemosiderin is present. Mild surrounding edema has
improved.

Left frontal lesion also has improved currently measuring 14 x 10 mm
and previously measuring 19 x 22 mm. Hemosiderin present. Mild white
matter edema has improved in the interval.

Numerous other small lesions also have improved. Small lesions in
the cerebellum bilaterally have improved. No new lesions.

Ventricle size normal. No significant midline shift. Previously
there was midline shift to the right due to edema.

Enhancing lesion in the left parietal bone is slightly larger and
suspicious for metastatic disease to bone. This measures
approximately 8 mm.

Negative for acute infarct.
IMPRESSION: Multiple hemorrhagic brain metastasis. These lesions are smaller
compared with the prior MRI. There is decreased edema and
mass-effect in the left hemisphere.

Left parietal bone enhancing lesion is concerning for metastatic
disease and appears slightly larger compared with the prior study

## 2016-07-31 NOTE — Telephone Encounter (Signed)
Oral Oncology Patient Advocate Encounter  Received notification from [employer] Patient Assistance program that patient has been successfully enrolled into their program to receive Tagrisso from the manufacturer at $0 out of pocket until 07/30/2017.   I called and spoke with patient.  I explained that the manufacturer will send her medicine to her home every month and to reach out to the oral oncology clinic if there are any issues or questions.  She is appreciative and understanding of the plan.   Gilmore Laroche, CPhT, [facility] Patient Advocate [phone] 07/31/2016 10:47 AM

## 2016-08-02 ENCOUNTER — Ambulatory Visit (HOSPITAL_BASED_OUTPATIENT_CLINIC_OR_DEPARTMENT_OTHER): Payer: Medicaid Other

## 2016-08-02 ENCOUNTER — Other Ambulatory Visit (HOSPITAL_BASED_OUTPATIENT_CLINIC_OR_DEPARTMENT_OTHER): Payer: Medicaid Other

## 2016-08-02 DIAGNOSIS — C3491 Malignant neoplasm of unspecified part of right bronchus or lung: Secondary | ICD-10-CM

## 2016-08-02 DIAGNOSIS — E889 Metabolic disorder, unspecified: Secondary | ICD-10-CM

## 2016-08-02 DIAGNOSIS — C3431 Malignant neoplasm of lower lobe, right bronchus or lung: Secondary | ICD-10-CM

## 2016-08-02 DIAGNOSIS — C342 Malignant neoplasm of middle lobe, bronchus or lung: Secondary | ICD-10-CM | POA: Diagnosis not present

## 2016-08-02 DIAGNOSIS — M9080 Osteopathy in diseases classified elsewhere, unspecified site: Secondary | ICD-10-CM

## 2016-08-02 DIAGNOSIS — C7951 Secondary malignant neoplasm of bone: Secondary | ICD-10-CM | POA: Diagnosis present

## 2016-08-02 DIAGNOSIS — M898X9 Other specified disorders of bone, unspecified site: Secondary | ICD-10-CM

## 2016-08-02 LAB — COMPREHENSIVE METABOLIC PANEL
ALT: 15 U/L (ref 0–55)
AST: 16 U/L (ref 5–34)
Albumin: 3.7 g/dL (ref 3.5–5.0)
Alkaline Phosphatase: 52 U/L (ref 40–150)
Anion Gap: 9 mEq/L (ref 3–11)
BUN: 13.3 mg/dL (ref 7.0–26.0)
CHLORIDE: 105 meq/L (ref 98–109)
CO2: 25 meq/L (ref 22–29)
CREATININE: 0.7 mg/dL (ref 0.6–1.1)
Calcium: 8.9 mg/dL (ref 8.4–10.4)
EGFR: >90 mL/min/{1.73_m2} (ref >90)
Glucose: 98 mg/dl (ref 70–140)
POTASSIUM: 4 meq/L (ref 3.5–5.1)
Sodium: 139 mEq/L (ref 136–145)
Total Bilirubin: 0.35 mg/dL (ref 0.20–1.20)
Total Protein: 7.2 g/dL (ref 6.4–8.3)

## 2016-08-02 LAB — CBC WITH DIFFERENTIAL/PLATELET
BASO%: 0.1 % (ref 0.0–2.0)
BASOS ABS: 0 10*3/uL (ref 0.0–0.1)
EOS%: 1.3 % (ref 0.0–7.0)
Eosinophils Absolute: 0.1 10*3/uL (ref 0.0–0.5)
HCT: 42.4 % (ref 34.8–46.6)
HGB: 13.9 g/dL (ref 11.6–15.9)
LYMPH#: 1 10*3/uL (ref 0.9–3.3)
LYMPH%: 13.7 % — AB (ref 14.0–49.7)
MCH: 31.6 pg (ref 25.1–34.0)
MCHC: 32.8 g/dL (ref 31.5–36.0)
MCV: 96.4 fL (ref 79.5–101.0)
MONO#: 0.5 10*3/uL (ref 0.1–0.9)
MONO%: 6.7 % (ref 0.0–14.0)
NEUT#: 5.5 10*3/uL (ref 1.5–6.5)
NEUT%: 78.2 % — AB (ref 38.4–76.8)
Platelets: 267 10*3/uL (ref 145–400)
RBC: 4.4 10*6/uL (ref 3.70–5.45)
RDW: 12.4 % (ref 11.2–14.5)
WBC: 7 10*3/uL (ref 3.9–10.3)

## 2016-08-02 MED ORDER — DENOSUMAB 120 MG/1.7ML ~~LOC~~ SOLN
120.0000 mg | Freq: Once | SUBCUTANEOUS | Status: AC
  Administered 2016-08-02: 120 mg via SUBCUTANEOUS
  Filled 2016-08-02: qty 1.7

## 2016-08-02 NOTE — Patient Instructions (Signed)

## 2016-08-10 ENCOUNTER — Other Ambulatory Visit: Payer: Self-pay | Admitting: Radiation Therapy

## 2016-08-10 DIAGNOSIS — C7931 Secondary malignant neoplasm of brain: Secondary | ICD-10-CM

## 2016-08-15 ENCOUNTER — Encounter: Payer: Self-pay | Admitting: Internal Medicine

## 2016-08-15 NOTE — Progress Notes (Signed)
Received voicemail from [facility] [phone]) regarding Medicaid status.  Returned call. She advised that patient has a $10,000 deductible to meet before Medicaid may pay. She will be eligible to reapply in October which will allow her to submit bills for July, August, and September for retro coverage. I advised her without a denial letter for Medicaid, she may not qualify for financial assistance through [place]. She states she will speak with her supervisor to see what she needs to do but if she applies for the [place] and approved, she won't have the deductible met for Medicaid which may also cover services billed outside of [facility].     Deborah Foster will keep in contact with what patient may need to do. She has my name and number.

## 2016-08-15 NOTE — Progress Notes (Signed)
Ms.Turner called back from [place] to advise me she spoke with her supervisor and is able to provide me with a denial letter. Patient may reapply once she receives $10,000 worth of bills to submit or in October when it has been long enough to have accumulated if not before then. She will send the denial letter to me via mail due to language barrier with patient.  She has my contact name and number for any additional questions or concerns.

## 2016-08-16 ENCOUNTER — Encounter: Payer: Self-pay | Admitting: Internal Medicine

## 2016-08-16 NOTE — Progress Notes (Signed)
Patient came in to meet with me to have Medicaid and deductible explained. I explained what was told to me by DSS. Patient states the caseworker called her as well. Patient verbalized understanding and has my name and number for any additional financial questions or concerns.

## 2016-08-30 ENCOUNTER — Ambulatory Visit (HOSPITAL_BASED_OUTPATIENT_CLINIC_OR_DEPARTMENT_OTHER): Payer: Medicaid Other

## 2016-08-30 ENCOUNTER — Other Ambulatory Visit (HOSPITAL_BASED_OUTPATIENT_CLINIC_OR_DEPARTMENT_OTHER): Payer: Medicaid Other

## 2016-08-30 VITALS — BP 103/48 | HR 104 | Temp 97.4°F | Resp 20

## 2016-08-30 DIAGNOSIS — M9080 Osteopathy in diseases classified elsewhere, unspecified site: Secondary | ICD-10-CM

## 2016-08-30 DIAGNOSIS — C7931 Secondary malignant neoplasm of brain: Secondary | ICD-10-CM

## 2016-08-30 DIAGNOSIS — C7951 Secondary malignant neoplasm of bone: Secondary | ICD-10-CM

## 2016-08-30 DIAGNOSIS — C342 Malignant neoplasm of middle lobe, bronchus or lung: Secondary | ICD-10-CM

## 2016-08-30 DIAGNOSIS — C3431 Malignant neoplasm of lower lobe, right bronchus or lung: Secondary | ICD-10-CM

## 2016-08-30 DIAGNOSIS — M898X9 Other specified disorders of bone, unspecified site: Secondary | ICD-10-CM

## 2016-08-30 DIAGNOSIS — C3491 Malignant neoplasm of unspecified part of right bronchus or lung: Secondary | ICD-10-CM

## 2016-08-30 DIAGNOSIS — Z5111 Encounter for antineoplastic chemotherapy: Secondary | ICD-10-CM

## 2016-08-30 DIAGNOSIS — E889 Metabolic disorder, unspecified: Secondary | ICD-10-CM

## 2016-08-30 DIAGNOSIS — C7972 Secondary malignant neoplasm of left adrenal gland: Secondary | ICD-10-CM

## 2016-08-30 LAB — CBC WITH DIFFERENTIAL/PLATELET
BASO%: 0.5 % (ref 0.0–2.0)
Basophils Absolute: 0 10*3/uL (ref 0.0–0.1)
EOS ABS: 0.1 10*3/uL (ref 0.0–0.5)
EOS%: 1.3 % (ref 0.0–7.0)
HCT: 42.8 % (ref 34.8–46.6)
HGB: 14.4 g/dL (ref 11.6–15.9)
LYMPH%: 14.8 % (ref 14.0–49.7)
MCH: 32.2 pg (ref 25.1–34.0)
MCHC: 33.7 g/dL (ref 31.5–36.0)
MCV: 95.3 fL (ref 79.5–101.0)
MONO#: 0.5 10*3/uL (ref 0.1–0.9)
MONO%: 9.1 % (ref 0.0–14.0)
NEUT#: 3.9 10*3/uL (ref 1.5–6.5)
NEUT%: 74.3 % (ref 38.4–76.8)
PLATELETS: 267 10*3/uL (ref 145–400)
RBC: 4.49 10*6/uL (ref 3.70–5.45)
RDW: 13.1 % (ref 11.2–14.5)
WBC: 5.2 10*3/uL (ref 3.9–10.3)
lymph#: 0.8 10*3/uL — ABNORMAL LOW (ref 0.9–3.3)

## 2016-08-30 LAB — COMPREHENSIVE METABOLIC PANEL
ALT: 18 U/L (ref 0–55)
ANION GAP: 8 meq/L (ref 3–11)
AST: 19 U/L (ref 5–34)
Albumin: 3.6 g/dL (ref 3.5–5.0)
Alkaline Phosphatase: 41 U/L (ref 40–150)
BUN: 12.1 mg/dL (ref 7.0–26.0)
CHLORIDE: 104 meq/L (ref 98–109)
CO2: 26 meq/L (ref 22–29)
Calcium: 9.1 mg/dL (ref 8.4–10.4)
Creatinine: 0.7 mg/dL (ref 0.6–1.1)
Glucose: 95 mg/dl (ref 70–140)
Potassium: 4 mEq/L (ref 3.5–5.1)
SODIUM: 138 meq/L (ref 136–145)
Total Bilirubin: 0.47 mg/dL (ref 0.20–1.20)
Total Protein: 6.7 g/dL (ref 6.4–8.3)

## 2016-08-30 MED ORDER — DENOSUMAB 120 MG/1.7ML ~~LOC~~ SOLN
120.0000 mg | Freq: Once | SUBCUTANEOUS | Status: AC
  Administered 2016-08-30: 120 mg via SUBCUTANEOUS
  Filled 2016-08-30: qty 1.7

## 2016-08-30 NOTE — Patient Instructions (Signed)
Denosumab injection  What is this medicine?  DENOSUMAB (den oh sue mab) slows bone breakdown. Prolia is used to treat osteoporosis in women after menopause and in men. Xgeva is used to prevent bone fractures and other bone problems caused by cancer bone metastases. Xgeva is also used to treat giant cell tumor of the bone.  This medicine may be used for other purposes; ask your health care provider or pharmacist if you have questions.  What should I tell my health care provider before I take this medicine?  They need to know if you have any of these conditions:  -dental disease  -eczema  -infection or history of infections  -kidney disease or on dialysis  -low blood calcium or vitamin D  -malabsorption syndrome  -scheduled to have surgery or tooth extraction  -taking medicine that contains denosumab  -thyroid or parathyroid disease  -an unusual reaction to denosumab, other medicines, foods, dyes, or preservatives  -pregnant or trying to get pregnant  -breast-feeding  How should I use this medicine?  This medicine is for injection under the skin. It is given by a health care professional in a hospital or clinic setting.  If you are getting Prolia, a special MedGuide will be given to you by the pharmacist with each prescription and refill. Be sure to read this information carefully each time.  For Prolia, talk to your pediatrician regarding the use of this medicine in children. Special care may be needed. For Xgeva, talk to your pediatrician regarding the use of this medicine in children. While this drug may be prescribed for children as young as 13 years for selected conditions, precautions do apply.  Overdosage: If you think you have taken too much of this medicine contact a poison control center or emergency room at once.  NOTE: This medicine is only for you. Do not share this medicine with others.  What if I miss a dose?  It is important not to miss your dose. Call your doctor or health care professional if you are  unable to keep an appointment.  What may interact with this medicine?  Do not take this medicine with any of the following medications:  -other medicines containing denosumab  This medicine may also interact with the following medications:  -medicines that suppress the immune system  -medicines that treat cancer  -steroid medicines like prednisone or cortisone  This list may not describe all possible interactions. Give your health care provider a list of all the medicines, herbs, non-prescription drugs, or dietary supplements you use. Also tell them if you smoke, drink alcohol, or use illegal drugs. Some items may interact with your medicine.  What should I watch for while using this medicine?  Visit your doctor or health care professional for regular checks on your progress. Your doctor or health care professional may order blood tests and other tests to see how you are doing.  Call your doctor or health care professional if you get a cold or other infection while receiving this medicine. Do not treat yourself. This medicine may decrease your body's ability to fight infection.  You should make sure you get enough calcium and vitamin D while you are taking this medicine, unless your doctor tells you not to. Discuss the foods you eat and the vitamins you take with your health care professional.  See your dentist regularly. Brush and floss your teeth as directed. Before you have any dental work done, tell your dentist you are receiving this medicine.  Do   not become pregnant while taking this medicine or for 5 months after stopping it. Women should inform their doctor if they wish to become pregnant or think they might be pregnant. There is a potential for serious side effects to an unborn child. Talk to your health care professional or pharmacist for more information.  What side effects may I notice from receiving this medicine?  Side effects that you should report to your doctor or health care professional as soon as  possible:  -allergic reactions like skin rash, itching or hives, swelling of the face, lips, or tongue  -breathing problems  -chest pain  -fast, irregular heartbeat  -feeling faint or lightheaded, falls  -fever, chills, or any other sign of infection  -muscle spasms, tightening, or twitches  -numbness or tingling  -skin blisters or bumps, or is dry, peels, or red  -slow healing or unexplained pain in the mouth or jaw  -unusual bleeding or bruising  Side effects that usually do not require medical attention (Report these to your doctor or health care professional if they continue or are bothersome.):  -muscle pain  -stomach upset, gas  This list may not describe all possible side effects. Call your doctor for medical advice about side effects. You may report side effects to FDA at 1-800-FDA-1088.  Where should I keep my medicine?  This medicine is only given in a clinic, doctor's office, or other health care setting and will not be stored at home.  NOTE: This sheet is a summary. It may not cover all possible information. If you have questions about this medicine, talk to your doctor, pharmacist, or health care provider.      2016, Elsevier/Gold Standard. (2011-07-02 12:37:47)

## 2016-08-31 ENCOUNTER — Ambulatory Visit
Admission: RE | Admit: 2016-08-31 | Discharge: 2016-08-31 | Disposition: A | Discharge status: Home / Self Care | Payer: Self-pay | Source: Ambulatory Visit | Attending: Radiation Oncology | Admitting: Radiation Oncology

## 2016-08-31 DIAGNOSIS — C7931 Secondary malignant neoplasm of brain: Secondary | ICD-10-CM

## 2016-08-31 MED ORDER — GADOBENATE DIMEGLUMINE 529 MG/ML IV SOLN
10.0000 mL | Freq: Once | INTRAVENOUS | Status: AC | PRN
  Administered 2016-08-31: 10 mL via INTRAVENOUS

## 2016-09-03 ENCOUNTER — Ambulatory Visit
Admission: RE | Admit: 2016-09-03 | Discharge: 2016-09-03 | Disposition: A | Discharge status: Home / Self Care | Payer: Medicaid Other | Source: Ambulatory Visit | Attending: Radiation Oncology | Admitting: Radiation Oncology

## 2016-09-03 ENCOUNTER — Ambulatory Visit (HOSPITAL_COMMUNITY)
Admission: RE | Admit: 2016-09-03 | Discharge: 2016-09-03 | Disposition: A | Discharge status: Home / Self Care | Payer: Medicaid Other | Source: Ambulatory Visit | Attending: Internal Medicine | Admitting: Internal Medicine

## 2016-09-03 ENCOUNTER — Encounter (HOSPITAL_COMMUNITY): Payer: Self-pay

## 2016-09-03 ENCOUNTER — Encounter: Payer: Self-pay | Admitting: Radiation Oncology

## 2016-09-03 VITALS — BP 103/55 | HR 105 | Temp 97.7°F | Resp 16 | Ht 59.0 in | Wt 108.0 lb

## 2016-09-03 DIAGNOSIS — Z923 Personal history of irradiation: Secondary | ICD-10-CM | POA: Diagnosis not present

## 2016-09-03 DIAGNOSIS — Z881 Allergy status to other antibiotic agents status: Secondary | ICD-10-CM | POA: Insufficient documentation

## 2016-09-03 DIAGNOSIS — Z8701 Personal history of pneumonia (recurrent): Secondary | ICD-10-CM | POA: Insufficient documentation

## 2016-09-03 DIAGNOSIS — C3491 Malignant neoplasm of unspecified part of right bronchus or lung: Secondary | ICD-10-CM

## 2016-09-03 DIAGNOSIS — C7972 Secondary malignant neoplasm of left adrenal gland: Secondary | ICD-10-CM | POA: Insufficient documentation

## 2016-09-03 DIAGNOSIS — K838 Other specified diseases of biliary tract: Secondary | ICD-10-CM | POA: Diagnosis not present

## 2016-09-03 DIAGNOSIS — Z9889 Other specified postprocedural states: Secondary | ICD-10-CM | POA: Insufficient documentation

## 2016-09-03 DIAGNOSIS — Z7951 Long term (current) use of inhaled steroids: Secondary | ICD-10-CM | POA: Insufficient documentation

## 2016-09-03 DIAGNOSIS — C7951 Secondary malignant neoplasm of bone: Secondary | ICD-10-CM | POA: Insufficient documentation

## 2016-09-03 DIAGNOSIS — C342 Malignant neoplasm of middle lobe, bronchus or lung: Secondary | ICD-10-CM | POA: Diagnosis present

## 2016-09-03 DIAGNOSIS — J8410 Pulmonary fibrosis, unspecified: Secondary | ICD-10-CM | POA: Diagnosis not present

## 2016-09-03 DIAGNOSIS — Z5111 Encounter for antineoplastic chemotherapy: Secondary | ICD-10-CM

## 2016-09-03 DIAGNOSIS — Z79899 Other long term (current) drug therapy: Secondary | ICD-10-CM | POA: Insufficient documentation

## 2016-09-03 DIAGNOSIS — C7931 Secondary malignant neoplasm of brain: Secondary | ICD-10-CM | POA: Insufficient documentation

## 2016-09-03 MED ORDER — IOPAMIDOL (ISOVUE-300) INJECTION 61%
100.0000 mL | Freq: Once | INTRAVENOUS | Status: AC | PRN
  Administered 2016-09-03: 80 mL via INTRAVENOUS

## 2016-09-03 MED ORDER — IOPAMIDOL (ISOVUE-300) INJECTION 61%
INTRAVENOUS | Status: AC
  Administered 2016-09-03: 80 mL via INTRAVENOUS
  Filled 2016-09-03: qty 100

## 2016-09-03 NOTE — Progress Notes (Signed)
Radiation Oncology         [phone] ________________________________  Name: Deborah Foster MRN: [id number]  Date: 09/03/2016  DOB: 1970/12/06  Follow up Note  CC: [name], MD  [name], *  Diagnosis:   Stage IV T2a, N1, M1b NSCLC, adenocarcinoma of the right middle lobe with metastatic disease to bone and brain.  Interval Since Last Radiation:  1 year one month  07/21/2015 to 08/03/2015: The Right hilar tumor was treated to 30 Gy in 10 fractions at 3 Gy per fraction.   08/23/14-09/07/14: 30 Gy in 12 fractions was prescribed to the right lung, whole brain, and left hip.  Narrative:  The patient returns today for routine follow-up. Of note her history includes stage IV lung cancer which was identified in the summer of 2016. She received whole brain and right lung radiotherapy along the left hip treatment in August 2016. She's been followed in surveillance since, she recurred locally in the chest, and received radiotherapy in the summer of 2017. She comes today for follow-up of her CNS. Her most recent MRI of the brain on 08/31/16 revealed no evidence of new disease, and her treated lesions in the brain are stable. She's again due for repeat systemic imaging with Dr. Julien Nordmann today. She is currently receiving Tagrisso.  On review of systems, the patient reports that she is doing well overall. She denies any chest pain, shortness of breath, cough, fevers, chills, night sweats, unintended weight changes. She denies any bowel or bladder disturbances, and denies abdominal pain, nausea or vomiting.She denies any headaches, visual, auditory, or speech changes. She reports occasional forgetfulness. She denies any progressive symptoms of this. She denies any new musculoskeletal or joint aches or pains, new skin lesions or concerns. A complete review of systems is obtained and is otherwise negative.   Past Medical History:  Past Medical History:  Diagnosis Date  . Bone metastasis  ([place])   . Hemoptysis   . Hypokalemia   . lung ca dx'd 07/2014  . Lung mass   . Metastasis to adrenal gland ([place])   . Metastasis to brain ([place])   . Pneumonia   . Radiation 08/23/14-09/07/14   Brain/chest and left hip 30 Gy 12 Fx  . URI (upper respiratory infection) 02-02-2015    Past Surgical History: Past Surgical History:  Procedure Laterality Date  . IR GENERIC HISTORICAL  07/14/2015   IR RADIOLOGIST EVAL & MGMT 07/14/2015 Greggory Keen, MD [facility] INTERV RAD  . VIDEO BRONCHOSCOPY Bilateral 08/09/2014   Procedure: VIDEO BRONCHOSCOPY WITHOUT FLUORO;  Surgeon: [name], MD;  [facility] ENDOSCOPY;  Service: Cardiopulmonary;  Laterality: Bilateral;  . VIDEO BRONCHOSCOPY Bilateral 07/06/2015   Procedure: VIDEO BRONCHOSCOPY WITHOUT FLUORO;  Surgeon: [name], MD;  Location: [facility] ENDOSCOPY;  Service: Cardiopulmonary;  Laterality: Bilateral;    Social History:  Social History   Social History  . Marital status: Married    Spouse name: N/A  . Number of children: 3  . Years of education: N/A   Occupational History  . unemployed    Social History Main Topics  . Smoking status: Never Smoker  . Smokeless tobacco: Never Used  . Alcohol use No  . Drug use: No  . Sexual activity: Not Currently   Other Topics Concern  . Not on file   Social History Narrative   Patient moved to [place] in [place].   Had been in [place] since that time.    Family History:  Family History  Problem Relation Age of Onset  . Hyperlipidemia Mother     ALLERGIES:  is allergic to other and doxycycline.  Meds: Current Outpatient Prescriptions  Medication Sig Dispense Refill  . benzonatate (TESSALON) 100 MG capsule Take 1 capsule (100 mg total) by mouth every 8 (eight) hours as needed. 20 capsule 0  . Calcium Carbonate-Vitamin D (CALCIUM 500 + D) 500-125 MG-UNIT TABS Take 500 mg by mouth 2 (two) times daily.    . Cyanocobalamin (VITAMIN B-12) 2500 MCG SUBL Place 2,500 mcg under  the tongue daily.    Marland Kitchen Dextromethorphan Polistirex (DELSYM PO) Take by mouth.    . ENSURE (ENSURE) Take 1 Can by mouth 2 (two) times daily between meals.     . ferrous sulfate 325 (65 FE) MG EC tablet Take 325 mg by mouth daily with breakfast.    . fluticasone (VERAMYST) 27.5 MCG/SPRAY nasal spray Place 2 sprays into the nose daily.    . folic acid (FOLVITE) 102 MCG tablet Take 1 tablet (800 mcg total) by mouth daily. 30 tablet 1  . loratadine (CLARITIN) 10 MG tablet Take 1 tablet (10 mg total) by mouth daily. 30 tablet 0  . Nutritional Supplements (JUICE PLUS FIBRE PO) Take 1 capsule by mouth daily. Reported on 07/28/2015    . osimertinib mesylate (TAGRISSO) 80 MG tablet Take 1 tablet (80 mg total) by mouth daily. 30 tablet 2  . OVER THE COUNTER MEDICATION "Deep penetrating pain relief oil"    . ranitidine (ZANTAC) 300 MG tablet Take 1 tablet (300 mg total) by mouth at bedtime. 30 tablet 6  . TURMERIC PO Take 1 capsule by mouth 2 (two) times daily. Turmeric Superior 750 mg po bid    . AMBULATORY NON FORMULARY MEDICATION Medication Name: MAGIC MOUTHWASH 2 % Viscous Lidocaine  Maalox Benadryl Susp.  Disp: 1:1:1 235ml bottle Sig: 39ml PO Swish & Spit  q 3-4hrs. PRN mouth sores (Patient not taking: Reported on 09/03/2016) 200 mL 3  . chlorpheniramine-HYDROcodone (TUSSIONEX) 10-8 MG/5ML SUER Take 5 mLs by mouth every 12 (twelve) hours as needed. (Patient not taking: Reported on 06/26/2016) 140 mL 0  . clindamycin (CLINDAGEL) 1 % gel Apply 1 application topically 2 (two) times daily.     No current facility-administered medications for this encounter.     Physical Findings:  height is 4\' 11"  (1.499 m) and weight is 108 lb (49 kg). Her oral temperature is 97.7 F (36.5 C). Her blood pressure is 103/55 (abnormal) and her pulse is 105 (abnormal). Her respiration is 16 and oxygen saturation is 99%.  In general this is a well appearing Asian female in no acute distress. She's alert and oriented x4 and  appropriate throughout the examination. Cardiopulmonary assessment is negative for acute distress and she exhibits normal effort. She is neurologically intact grossly without change. Her hair continues to grow and she does not have any additional alopecia.  Lab Findings: Lab Results  Component Value Date   WBC 5.2 08/30/2016   HGB 14.4 08/30/2016   HCT 42.8 08/30/2016   MCV 95.3 08/30/2016   PLT 267 08/30/2016     Radiographic Findings: Mr Jeri Cos HE Contrast  Result Date: 08/31/2016 CLINICAL DATA:  Followup metastatic lung cancer. EXAM: MRI HEAD WITHOUT AND WITH CONTRAST TECHNIQUE: Multiplanar, multiecho pulse sequences of the brain and surrounding structures were obtained without and with intravenous contrast. CONTRAST:  17mL MULTIHANCE GADOBENATE DIMEGLUMINE 529 MG/ML IV SOLN COMPARISON:  05/18/2016.  02/13/2016.  08/16/2014 FINDINGS: Brain:  The examination is precisely stable. Eight previously treated lesions remain visible without evidence of residual or recurrent viable tumor. Hemosiderin deposition present an these locations. The largest abnormality in the left posterior temporal lobe is unchanged measuring 19 x 13 mm, as the others are unchanged. Other scattered treated lesions previously seen are not associated with residual hemosiderin deposition. No evidence of any new lesion. No vasogenic edema. No mass effect. No sign of ischemic infarction. No hydrocephalus or extra-axial collection. Vascular: Major vessels at the base of the brain show flow. Skull and upper cervical spine: Negative Sinuses/Orbits: Clear/normal Other: None significant IMPRESSION: Stable exam. No evidence of residual active disease or any new lesions. Eight previously treated lesions with hemosiderin deposition as outlined above. Electronically Signed   By: Nelson Chimes M.D.   On: 08/31/2016 14:23    Impression/Plan: 1. Stage IV T2a, N1, M1b NSCLC, adenocarcinoma of the right middle lobe with metastatic disease to  bone and brain. The patient will continue with Dr. Julien Nordmann and will have repeat staging scans for her chest today. We reviewed her MRI appears stable without new disease, and recommendations from conference were to proceed with 3 month repeat MRI. She will call sooner if she has questions or concerns prior to that time.     Carola Rhine, PAC

## 2016-09-06 ENCOUNTER — Encounter: Payer: Self-pay | Admitting: Internal Medicine

## 2016-09-06 ENCOUNTER — Ambulatory Visit (HOSPITAL_BASED_OUTPATIENT_CLINIC_OR_DEPARTMENT_OTHER): Payer: Medicaid Other | Admitting: Internal Medicine

## 2016-09-06 ENCOUNTER — Telehealth: Payer: Self-pay | Admitting: Internal Medicine

## 2016-09-06 VITALS — BP 90/55 | HR 98 | Temp 98.0°F | Resp 18 | Ht 59.0 in | Wt 109.3 lb

## 2016-09-06 DIAGNOSIS — C7951 Secondary malignant neoplasm of bone: Secondary | ICD-10-CM

## 2016-09-06 DIAGNOSIS — C3490 Malignant neoplasm of unspecified part of unspecified bronchus or lung: Secondary | ICD-10-CM

## 2016-09-06 DIAGNOSIS — C342 Malignant neoplasm of middle lobe, bronchus or lung: Secondary | ICD-10-CM | POA: Diagnosis not present

## 2016-09-06 DIAGNOSIS — C7972 Secondary malignant neoplasm of left adrenal gland: Secondary | ICD-10-CM | POA: Diagnosis not present

## 2016-09-06 DIAGNOSIS — C7931 Secondary malignant neoplasm of brain: Secondary | ICD-10-CM | POA: Diagnosis not present

## 2016-09-06 DIAGNOSIS — C3491 Malignant neoplasm of unspecified part of right bronchus or lung: Secondary | ICD-10-CM

## 2016-09-06 MED ORDER — BENZONATATE 100 MG PO CAPS: 100.0000 mg | ORAL_CAPSULE | Freq: Three times a day (TID) | ORAL | 0 refills | Status: DC | PRN

## 2016-09-06 MED FILL — BENZONATATE 100 MG CAP: 100 | 7 days supply | Qty: 20 | Fill #0

## 2016-09-06 NOTE — Telephone Encounter (Signed)
Gave patient avs and calendar for upcoming appointments.  °

## 2016-09-06 NOTE — Progress Notes (Signed)
[place] Telephone:(336) [phone]   Fax:(336) [phone]  OFFICE PROGRESS NOTE  [name], MD [place] [address]  DIAGNOSIS: Stage IV (T2a, N1, M1b) non-small cell lung cancer, adenocarcinoma with positive EGFR mutation (deletion 60) presented with right middle lobe lung mass, right hilar adenopathy as well as metastatic disease to the bone, brain and left adrenal diagnosed in July 2016.  PRIOR THERAPY: 1) palliative radiotherapy to the brain as well as metastatic bone lesions in the pelvis. 2) Tarceva 150 mg by mouth daily started on 09/25/2014. Status post 5 months of treatment. 3) status post palliative radiotherapy to the right middle lobe lung mass for treatment of hemoptysis. 4) Tarceva 100 mg by mouth daily started 02/19/2015 status post 13 months of treatment. This was discontinued in May 2018 after the patient had disease progression and development of T790M resistant mutation.   CURRENT THERAPY: 1) Tagrisso 80 mg by mouth daily started 06/02/2016. 2) Xgeva 120 g subcutaneously on monthly basis.  INTERVAL HISTORY: Deborah Foster 47 y.o. female returns to the clinic today for follow-up visit accompanied by her interpreter. The patient is doing fine today with no specific complaints. She denied having any chest pain, shortness of breath but continues to have intermittent cough with no hemoptysis. She also has some aching pain on the left side of the neck probably secondary to sleeping position. She denied having any fever or chills. She has no nausea, vomiting but has few episodes of diarrhea. She has no significant weight loss or night sweats. She has been tolerating her treatment with Tagrisso fairly well. She had repeat CT scan of the chest, abdomen and pelvis performed recently and she is here for evaluation and discussion of her scan results.   MEDICAL HISTORY: Past Medical History:  Diagnosis Date  . Bone metastasis ([place])    . Hemoptysis   . Hypokalemia   . lung ca dx'd 07/2014  . Lung mass   . Metastasis to adrenal gland ([place])   . Metastasis to brain ([place])   . Pneumonia   . Radiation 08/23/14-09/07/14   Brain/chest and left hip 30 Gy 12 Fx  . URI (upper respiratory infection) 02-02-2015    ALLERGIES:  is allergic to other and doxycycline.  MEDICATIONS:  Current Outpatient Prescriptions  Medication Sig Dispense Refill  . AMBULATORY NON FORMULARY MEDICATION Medication Name: MAGIC MOUTHWASH 2 % Viscous Lidocaine  Maalox Benadryl Susp.  Disp: 1:1:1 255m bottle Sig: 122mPO Swish & Spit  q 3-4hrs. PRN mouth sores (Patient not taking: Reported on 09/03/2016) 200 mL 3  . benzonatate (TESSALON) 100 MG capsule Take 1 capsule (100 mg total) by mouth every 8 (eight) hours as needed. 20 capsule 0  . Calcium Carbonate-Vitamin D (CALCIUM 500 + D) 500-125 MG-UNIT TABS Take 500 mg by mouth 2 (two) times daily.    . chlorpheniramine-HYDROcodone (TUSSIONEX) 10-8 MG/5ML SUER Take 5 mLs by mouth every 12 (twelve) hours as needed. (Patient not taking: Reported on 06/26/2016) 140 mL 0  . clindamycin (CLINDAGEL) 1 % gel Apply 1 application topically 2 (two) times daily.    . Cyanocobalamin (VITAMIN B-12) 2500 MCG SUBL Place 2,500 mcg under the tongue daily.    . Marland Kitchenextromethorphan Polistirex (DELSYM PO) Take by mouth.    . ENSURE (ENSURE) Take 1 Can by mouth 2 (two) times daily between meals.     . ferrous sulfate 325 (65 FE) MG EC tablet Take 325 mg by  mouth daily with breakfast.    . fluticasone (VERAMYST) 27.5 MCG/SPRAY nasal spray Place 2 sprays into the nose daily.    . folic acid (FOLVITE) 222 MCG tablet Take 1 tablet (800 mcg total) by mouth daily. 30 tablet 1  . loratadine (CLARITIN) 10 MG tablet Take 1 tablet (10 mg total) by mouth daily. 30 tablet 0  . Nutritional Supplements (JUICE PLUS FIBRE PO) Take 1 capsule by mouth daily. Reported on 07/28/2015    . osimertinib mesylate (TAGRISSO) 80 MG tablet Take 1 tablet (80  mg total) by mouth daily. 30 tablet 2  . OVER THE COUNTER MEDICATION "Deep penetrating pain relief oil"    . ranitidine (ZANTAC) 300 MG tablet Take 1 tablet (300 mg total) by mouth at bedtime. 30 tablet 6  . TURMERIC PO Take 1 capsule by mouth 2 (two) times daily. Turmeric Superior 750 mg po bid     No current facility-administered medications for this visit.     SURGICAL HISTORY:  Past Surgical History:  Procedure Laterality Date  . IR GENERIC HISTORICAL  07/14/2015   IR RADIOLOGIST EVAL & MGMT 07/14/2015 Greggory Keen, MD [facility] INTERV RAD  . VIDEO BRONCHOSCOPY Bilateral 08/09/2014   Procedure: VIDEO BRONCHOSCOPY WITHOUT FLUORO;  Surgeon: [name], MD;  Location: [facility];  Service: Cardiopulmonary;  Laterality: Bilateral;  . VIDEO BRONCHOSCOPY Bilateral 07/06/2015   Procedure: VIDEO BRONCHOSCOPY WITHOUT FLUORO;  Surgeon: [name], MD;  Location: [facility]COPY;  Service: Cardiopulmonary;  Laterality: Bilateral;    REVIEW OF SYSTEMS:  Constitutional: negative Eyes: negative Ears, nose, mouth, throat, and face: negative Respiratory: positive for cough Cardiovascular: negative Gastrointestinal: negative Genitourinary:negative Integument/breast: negative Hematologic/lymphatic: negative Musculoskeletal:positive for neck pain Neurological: negative Behavioral/Psych: negative Endocrine: negative Allergic/Immunologic: negative   PHYSICAL EXAMINATION: General appearance: alert, cooperative and no distress Head: Normocephalic, without obvious abnormality, atraumatic Neck: no adenopathy, no JVD, supple, symmetrical, trachea midline and thyroid not enlarged, symmetric, no tenderness/mass/nodules Lymph nodes: Cervical, supraclavicular, and axillary nodes normal. Resp: clear to auscultation bilaterally Back: symmetric, no curvature. ROM normal. No CVA tenderness. Cardio: regular rate and rhythm, S1, S2 normal, no murmur, click, rub or gallop GI: soft, non-tender; bowel  sounds normal; no masses,  no organomegaly Extremities: extremities normal, atraumatic, no cyanosis or edema Neurologic: Alert and oriented X 3, normal strength and tone. Normal symmetric reflexes. Normal coordination and gait  ECOG PERFORMANCE STATUS: 1 - Symptomatic but completely ambulatory  Blood pressure (!) 90/55, pulse 98, temperature 98 F (36.7 C), temperature source Oral, resp. rate 18, height '4\' 11"'$  (1.499 m), weight 109 lb 4.8 oz (49.6 kg), SpO2 100 %.  LABORATORY DATA: Lab Results  Component Value Date   WBC 5.2 08/30/2016   HGB 14.4 08/30/2016   HCT 42.8 08/30/2016   MCV 95.3 08/30/2016   PLT 267 08/30/2016      Chemistry      Component Value Date/Time   NA 138 08/30/2016 0917   K 4.0 08/30/2016 0917   CL 109 07/19/2015 0509   CO2 26 08/30/2016 0917   BUN 12.1 08/30/2016 0917   CREATININE 0.7 08/30/2016 0917      Component Value Date/Time   CALCIUM 9.1 08/30/2016 0917   ALKPHOS 41 08/30/2016 0917   AST 19 08/30/2016 0917   ALT 18 08/30/2016 0917   BILITOT 0.47 08/30/2016 0917       RADIOGRAPHIC STUDIES: Ct Chest W Contrast  Result Date: 09/03/2016 CLINICAL DATA:  Stage IV non-small-cell lung cancer with metastasis to hip,  adrenal glands, and brain. Cough for 2 years. Oral chemotherapy ongoing. EXAM: CT CHEST, ABDOMEN, AND PELVIS WITH CONTRAST TECHNIQUE: Multidetector CT imaging of the chest, abdomen and pelvis was performed following the standard protocol during bolus administration of intravenous contrast. CONTRAST:  80 cc of Isovue-300 COMPARISON:  05/28/2016 FINDINGS: CT CHEST FINDINGS Cardiovascular: Tortuous thoracic aorta. Normal heart size, without pericardial effusion. No central pulmonary embolism, on this non-dedicated study. Pulmonary artery enlargement, outflow tract 3.3 cm. Mediastinum/Nodes: No supraclavicular adenopathy. No axillary adenopathy. No mediastinal or hilar adenopathy. Minimal residual thymic tissue in the anterior mediastinum.  Lungs/Pleura: Similar trace right pleural fluid. resolution of previously described pulmonary nodules. Medial right lung base radiation fibrosis is similar. Musculoskeletal: Similar appearance of widespread osseous metastasis CT ABDOMEN PELVIS FINDINGS Hepatobiliary: Normal liver. Normal gallbladder. Persistent common duct dilatation. Example 11 mm on image 58/ series 2. Compare 14 mm at the same level on the prior. Pancreas: Normal, without mass or ductal dilatation. Spleen: Normal in size, without focal abnormality. Adrenals/Urinary Tract: Resolution of lateral limb left adrenal gland mass. Normal right adrenal gland. Normal kidneys, without hydronephrosis. Normal urinary bladder. Stomach/Bowel: Normal stomach, without wall thickening. Normal colon and terminal ileum. Normal small bowel. Vascular/Lymphatic: Aortic atherosclerosis. Resolution of retroperitoneal adenopathy. No pelvic sidewall adenopathy. Reproductive: Probable nabothian cysts. 4.4 cm right ovarian lesion is likely a cyst. Other: No significant free fluid. No evidence of omental or peritoneal disease. Musculoskeletal: Similar appearance of widespread primarily sclerotic osseous metastasis. IMPRESSION: 1. Marked/complete soft tissue response to metastatic disease. 2. Similar osseous metastasis. 3. Pulmonary artery enlargement suggests pulmonary arterial hypertension. 4. Chronic common duct dilatation, of indeterminate etiology. 5. Similar radiation fibrosis in the right lung base with adjacent trace right pleural fluid. Electronically Signed   By: Jeronimo Greaves M.D.   On: 09/03/2016 09:55   Mr Laqueta Jean HD Contrast  Result Date: 08/31/2016 CLINICAL DATA:  Followup metastatic lung cancer. EXAM: MRI HEAD WITHOUT AND WITH CONTRAST TECHNIQUE: Multiplanar, multiecho pulse sequences of the brain and surrounding structures were obtained without and with intravenous contrast. CONTRAST:  1mL MULTIHANCE GADOBENATE DIMEGLUMINE 529 MG/ML IV SOLN COMPARISON:   05/18/2016.  02/13/2016.  08/16/2014 FINDINGS: Brain: The examination is precisely stable. Eight previously treated lesions remain visible without evidence of residual or recurrent viable tumor. Hemosiderin deposition present an these locations. The largest abnormality in the left posterior temporal lobe is unchanged measuring 19 x 13 mm, as the others are unchanged. Other scattered treated lesions previously seen are not associated with residual hemosiderin deposition. No evidence of any new lesion. No vasogenic edema. No mass effect. No sign of ischemic infarction. No hydrocephalus or extra-axial collection. Vascular: Major vessels at the base of the brain show flow. Skull and upper cervical spine: Negative Sinuses/Orbits: Clear/normal Other: None significant IMPRESSION: Stable exam. No evidence of residual active disease or any new lesions. Eight previously treated lesions with hemosiderin deposition as outlined above. Electronically Signed   By: Paulina Fusi M.D.   On: 08/31/2016 14:23   Ct Abdomen Pelvis W Contrast  Result Date: 09/03/2016 CLINICAL DATA:  Stage IV non-small-cell lung cancer with metastasis to hip, adrenal glands, and brain. Cough for 2 years. Oral chemotherapy ongoing. EXAM: CT CHEST, ABDOMEN, AND PELVIS WITH CONTRAST TECHNIQUE: Multidetector CT imaging of the chest, abdomen and pelvis was performed following the standard protocol during bolus administration of intravenous contrast. CONTRAST:  80 cc of Isovue-300 COMPARISON:  05/28/2016 FINDINGS: CT CHEST FINDINGS Cardiovascular: Tortuous thoracic aorta. Normal heart size, without pericardial effusion.  No central pulmonary embolism, on this non-dedicated study. Pulmonary artery enlargement, outflow tract 3.3 cm. Mediastinum/Nodes: No supraclavicular adenopathy. No axillary adenopathy. No mediastinal or hilar adenopathy. Minimal residual thymic tissue in the anterior mediastinum. Lungs/Pleura: Similar trace right pleural fluid. resolution of  previously described pulmonary nodules. Medial right lung base radiation fibrosis is similar. Musculoskeletal: Similar appearance of widespread osseous metastasis CT ABDOMEN PELVIS FINDINGS Hepatobiliary: Normal liver. Normal gallbladder. Persistent common duct dilatation. Example 11 mm on image 58/ series 2. Compare 14 mm at the same level on the prior. Pancreas: Normal, without mass or ductal dilatation. Spleen: Normal in size, without focal abnormality. Adrenals/Urinary Tract: Resolution of lateral limb left adrenal gland mass. Normal right adrenal gland. Normal kidneys, without hydronephrosis. Normal urinary bladder. Stomach/Bowel: Normal stomach, without wall thickening. Normal colon and terminal ileum. Normal small bowel. Vascular/Lymphatic: Aortic atherosclerosis. Resolution of retroperitoneal adenopathy. No pelvic sidewall adenopathy. Reproductive: Probable nabothian cysts. 4.4 cm right ovarian lesion is likely a cyst. Other: No significant free fluid. No evidence of omental or peritoneal disease. Musculoskeletal: Similar appearance of widespread primarily sclerotic osseous metastasis. IMPRESSION: 1. Marked/complete soft tissue response to metastatic disease. 2. Similar osseous metastasis. 3. Pulmonary artery enlargement suggests pulmonary arterial hypertension. 4. Chronic common duct dilatation, of indeterminate etiology. 5. Similar radiation fibrosis in the right lung base with adjacent trace right pleural fluid. Electronically Signed   By: Abigail Miyamoto M.D.   On: 09/03/2016 09:55    ASSESSMENT AND PLAN:  This is a very pleasant 46 years old Asian female with stage IV non-small cell lung cancer, adenocarcinoma with positive EGFR mutation with deletion in exon 67 diagnosed in July 2016 status post treatment with Tarceva for a total of 18 months but this was discontinued secondary to disease progression and development of T790M resistant mutation. The patient was started on treatment with Tagrisso 80 mg  by mouth daily status post 3 months of treatment. She is tolerating this treatment fairly well with no significant adverse effects. She had repeat CT scan of the chest, abdomen and pelvis performed recently. I personally and independently reviewed the scan images and discuss the results with the patient today. Her scan showed improvement of her disease with almost complete soft tissue response in the metastatic disease. I recommended for the patient to continue her current treatment with Tagrisso with the same dose. For the metastatic bone disease, she will continue her current treatment with Xgeva. I would see her back for follow-up visit in one month's for evaluation and repeat blood work. The patient was advised to call immediately if she has any concerning symptoms in the interval. The patient voices understanding of current disease status and treatment options and is in agreement with the current care plan. All questions were answered. The patient knows to call the clinic with any problems, questions or concerns. We can certainly see the patient much sooner if necessary.  Disclaimer: This note was dictated with voice recognition software. Similar sounding words can inadvertently be transcribed and may not be corrected upon review.

## 2016-09-10 MED FILL — raNITIdine HCL 300 MG TABS: 300 | 30 days supply | Qty: 30 | Fill #4

## 2016-09-12 ENCOUNTER — Other Ambulatory Visit: Payer: Self-pay | Admitting: Pharmacist

## 2016-09-26 ENCOUNTER — Other Ambulatory Visit: Payer: Self-pay | Admitting: Internal Medicine

## 2016-09-27 ENCOUNTER — Other Ambulatory Visit (HOSPITAL_BASED_OUTPATIENT_CLINIC_OR_DEPARTMENT_OTHER): Payer: Medicaid Other

## 2016-09-27 ENCOUNTER — Ambulatory Visit (HOSPITAL_BASED_OUTPATIENT_CLINIC_OR_DEPARTMENT_OTHER): Payer: Medicaid Other

## 2016-09-27 VITALS — BP 102/34 | HR 93 | Temp 97.6°F | Resp 18

## 2016-09-27 DIAGNOSIS — C342 Malignant neoplasm of middle lobe, bronchus or lung: Secondary | ICD-10-CM

## 2016-09-27 DIAGNOSIS — C7951 Secondary malignant neoplasm of bone: Secondary | ICD-10-CM

## 2016-09-27 DIAGNOSIS — C7972 Secondary malignant neoplasm of left adrenal gland: Secondary | ICD-10-CM

## 2016-09-27 DIAGNOSIS — Z5111 Encounter for antineoplastic chemotherapy: Secondary | ICD-10-CM

## 2016-09-27 DIAGNOSIS — C3491 Malignant neoplasm of unspecified part of right bronchus or lung: Secondary | ICD-10-CM

## 2016-09-27 DIAGNOSIS — M9080 Osteopathy in diseases classified elsewhere, unspecified site: Secondary | ICD-10-CM

## 2016-09-27 DIAGNOSIS — C3431 Malignant neoplasm of lower lobe, right bronchus or lung: Secondary | ICD-10-CM

## 2016-09-27 DIAGNOSIS — M898X9 Other specified disorders of bone, unspecified site: Secondary | ICD-10-CM

## 2016-09-27 DIAGNOSIS — E889 Metabolic disorder, unspecified: Secondary | ICD-10-CM

## 2016-09-27 DIAGNOSIS — C7931 Secondary malignant neoplasm of brain: Secondary | ICD-10-CM

## 2016-09-27 LAB — CBC WITH DIFFERENTIAL/PLATELET
BASO%: 0.1 % (ref 0.0–2.0)
Basophils Absolute: 0 10*3/uL (ref 0.0–0.1)
EOS ABS: 0.1 10*3/uL (ref 0.0–0.5)
EOS%: 1.5 % (ref 0.0–7.0)
HEMATOCRIT: 40.7 % (ref 34.8–46.6)
HEMOGLOBIN: 13.6 g/dL (ref 11.6–15.9)
LYMPH%: 14.3 % (ref 14.0–49.7)
MCH: 31.9 pg (ref 25.1–34.0)
MCHC: 33.4 g/dL (ref 31.5–36.0)
MCV: 95.3 fL (ref 79.5–101.0)
MONO#: 0.8 10*3/uL (ref 0.1–0.9)
MONO%: 10.9 % (ref 0.0–14.0)
NEUT#: 5.4 10*3/uL (ref 1.5–6.5)
NEUT%: 73.2 % (ref 38.4–76.8)
Platelets: 258 10*3/uL (ref 145–400)
RBC: 4.27 10*6/uL (ref 3.70–5.45)
RDW: 12.9 % (ref 11.2–14.5)
WBC: 7.4 10*3/uL (ref 3.9–10.3)
lymph#: 1.1 10*3/uL (ref 0.9–3.3)

## 2016-09-27 LAB — COMPREHENSIVE METABOLIC PANEL
ALBUMIN: 3.6 g/dL (ref 3.5–5.0)
ALK PHOS: 45 U/L (ref 40–150)
ALT: 21 U/L (ref 0–55)
AST: 18 U/L (ref 5–34)
Anion Gap: 8 mEq/L (ref 3–11)
BILIRUBIN TOTAL: 0.36 mg/dL (ref 0.20–1.20)
BUN: 13 mg/dL (ref 7.0–26.0)
CALCIUM: 9.2 mg/dL (ref 8.4–10.4)
CO2: 26 mEq/L (ref 22–29)
CREATININE: 0.7 mg/dL (ref 0.6–1.1)
Chloride: 104 mEq/L (ref 98–109)
EGFR: >90 mL/min/{1.73_m2} (ref >90)
Glucose: 83 mg/dl (ref 70–140)
POTASSIUM: 4.6 meq/L (ref 3.5–5.1)
Sodium: 137 mEq/L (ref 136–145)
TOTAL PROTEIN: 6.8 g/dL (ref 6.4–8.3)

## 2016-09-27 MED ORDER — DENOSUMAB 120 MG/1.7ML ~~LOC~~ SOLN
120.0000 mg | Freq: Once | SUBCUTANEOUS | Status: AC
Start: 2016-09-27 — End: 2016-09-27
  Administered 2016-09-27: 120 mg via SUBCUTANEOUS
  Filled 2016-09-27: qty 1.7

## 2016-10-09 ENCOUNTER — Other Ambulatory Visit (HOSPITAL_BASED_OUTPATIENT_CLINIC_OR_DEPARTMENT_OTHER): Payer: Medicaid Other

## 2016-10-09 ENCOUNTER — Encounter: Payer: Self-pay | Admitting: Internal Medicine

## 2016-10-09 ENCOUNTER — Telehealth: Payer: Self-pay | Admitting: Internal Medicine

## 2016-10-09 ENCOUNTER — Ambulatory Visit (HOSPITAL_BASED_OUTPATIENT_CLINIC_OR_DEPARTMENT_OTHER): Payer: Medicaid Other | Admitting: Internal Medicine

## 2016-10-09 DIAGNOSIS — C7951 Secondary malignant neoplasm of bone: Secondary | ICD-10-CM

## 2016-10-09 DIAGNOSIS — C7972 Secondary malignant neoplasm of left adrenal gland: Secondary | ICD-10-CM | POA: Diagnosis not present

## 2016-10-09 DIAGNOSIS — C3491 Malignant neoplasm of unspecified part of right bronchus or lung: Secondary | ICD-10-CM

## 2016-10-09 DIAGNOSIS — C7931 Secondary malignant neoplasm of brain: Secondary | ICD-10-CM | POA: Diagnosis not present

## 2016-10-09 DIAGNOSIS — Z5111 Encounter for antineoplastic chemotherapy: Secondary | ICD-10-CM

## 2016-10-09 DIAGNOSIS — C342 Malignant neoplasm of middle lobe, bronchus or lung: Secondary | ICD-10-CM

## 2016-10-09 DIAGNOSIS — R21 Rash and other nonspecific skin eruption: Secondary | ICD-10-CM | POA: Diagnosis not present

## 2016-10-09 DIAGNOSIS — C3490 Malignant neoplasm of unspecified part of unspecified bronchus or lung: Secondary | ICD-10-CM

## 2016-10-09 LAB — CBC WITH DIFFERENTIAL/PLATELET
BASO%: 0.3 % (ref 0.0–2.0)
Basophils Absolute: 0 10*3/uL (ref 0.0–0.1)
EOS%: 1.4 % (ref 0.0–7.0)
Eosinophils Absolute: 0.1 10*3/uL (ref 0.0–0.5)
HEMATOCRIT: 41.9 % (ref 34.8–46.6)
HEMOGLOBIN: 13.8 g/dL (ref 11.6–15.9)
LYMPH#: 1.2 10*3/uL (ref 0.9–3.3)
LYMPH%: 18.6 % (ref 14.0–49.7)
MCH: 31.7 pg (ref 25.1–34.0)
MCHC: 32.9 g/dL (ref 31.5–36.0)
MCV: 96.1 fL (ref 79.5–101.0)
MONO#: 0.7 10*3/uL (ref 0.1–0.9)
MONO%: 11 % (ref 0.0–14.0)
NEUT#: 4.4 10*3/uL (ref 1.5–6.5)
NEUT%: 68.7 % (ref 38.4–76.8)
Platelets: 271 10*3/uL (ref 145–400)
RBC: 4.36 10*6/uL (ref 3.70–5.45)
RDW: 13 % (ref 11.2–14.5)
WBC: 6.5 10*3/uL (ref 3.9–10.3)

## 2016-10-09 LAB — COMPREHENSIVE METABOLIC PANEL
ALBUMIN: 3.7 g/dL (ref 3.5–5.0)
ALK PHOS: 52 U/L (ref 40–150)
ALT: 23 U/L (ref 0–55)
AST: 21 U/L (ref 5–34)
Anion Gap: 7 mEq/L (ref 3–11)
BILIRUBIN TOTAL: 0.44 mg/dL (ref 0.20–1.20)
BUN: 16.8 mg/dL (ref 7.0–26.0)
CO2: 28 mEq/L (ref 22–29)
Calcium: 8.9 mg/dL (ref 8.4–10.4)
Chloride: 104 mEq/L (ref 98–109)
Creatinine: 0.7 mg/dL (ref 0.6–1.1)
EGFR: >90 mL/min/{1.73_m2} (ref >90)
GLUCOSE: 81 mg/dL (ref 70–140)
Potassium: 4.1 mEq/L (ref 3.5–5.1)
SODIUM: 139 meq/L (ref 136–145)
TOTAL PROTEIN: 7.1 g/dL (ref 6.4–8.3)

## 2016-10-09 MED ORDER — OSIMERTINIB MESYLATE 80 MG PO TABS: 80.0000 mg | ORAL_TABLET | Freq: Every day | ORAL | 2 refills | Status: DC

## 2016-10-09 NOTE — Telephone Encounter (Signed)
Scheduled appt per 9/25 los - Gave patient AVS and calender per los.

## 2016-10-09 NOTE — Progress Notes (Signed)
[place] [phone]   Fax:(336) [phone]  OFFICE PROGRESS NOTE  [name], MD [place] [address]  DIAGNOSIS: Stage IV (T2a, N1, M1b) non-small cell lung cancer, adenocarcinoma with positive EGFR mutation (deletion 22) presented with right middle lobe lung mass, right hilar adenopathy as well as metastatic disease to the bone, brain and left adrenal diagnosed in July 2016.  PRIOR THERAPY: 1) palliative radiotherapy to the brain as well as metastatic bone lesions in the pelvis. 2) Tarceva 150 mg by mouth daily started on 09/25/2014. Status post 5 months of treatment. 3) status post palliative radiotherapy to the right middle lobe lung mass for treatment of hemoptysis. 4) Tarceva 100 mg by mouth daily started 02/19/2015 status post 13 months of treatment. This was discontinued in May 2018 after the patient had disease progression and development of T790M resistant mutation.   CURRENT THERAPY: 1) Tagrisso 80 mg by mouth daily started 06/02/2016. 2) Xgeva 120 g subcutaneously on monthly basis.  INTERVAL HISTORY: Deborah Foster 46 y.o. female returns to the clinic today for follow-up visit. The patient is feeling fine today with no specific complaints. She continues to tolerate her treatment with Tagrisso fairly well except for mild cough. She denied having any recent weight loss or night sweats. She has no nausea, vomiting, diarrhea or constipation. The patient denied having any chest pain, shortness breath or hemoptysis. She denied having any fever or chills. She is here today for evaluation and repeat blood work.   MEDICAL HISTORY: Past Medical History:  Diagnosis Date  . Bone metastasis ([place])   . Hemoptysis   . Hypokalemia   . lung ca dx'd 07/2014  . Lung mass   . Metastasis to adrenal gland ([place])   . Metastasis to brain ([place])   . Pneumonia   . Radiation 08/23/14-09/07/14   Brain/chest and left hip 30 Gy 12 Fx  .  URI (upper respiratory infection) 02-02-2015    ALLERGIES:  is allergic to other and doxycycline.  MEDICATIONS:  Current Outpatient Prescriptions  Medication Sig Dispense Refill  . AMBULATORY NON FORMULARY MEDICATION Medication Name: MAGIC MOUTHWASH 2 % Viscous Lidocaine  Maalox Benadryl Susp.  Disp: 1:1:1 276m bottle Sig: 136mPO Swish & Spit  q 3-4hrs. PRN mouth sores (Patient not taking: Reported on 09/03/2016) 200 mL 3  . benzonatate (TESSALON) 100 MG capsule Take 1 capsule (100 mg total) by mouth every 8 (eight) hours as needed. 20 capsule 0  . Calcium Carbonate-Vitamin D (CALCIUM 500 + D) 500-125 MG-UNIT TABS Take 500 mg by mouth 2 (two) times daily.    . chlorpheniramine-HYDROcodone (TUSSIONEX) 10-8 MG/5ML SUER Take 5 mLs by mouth every 12 (twelve) hours as needed. (Patient not taking: Reported on 06/26/2016) 140 mL 0  . clindamycin (CLINDAGEL) 1 % gel Apply 1 application topically 2 (two) times daily.    . Cyanocobalamin (VITAMIN B-12) 2500 MCG SUBL Place 2,500 mcg under the tongue daily.    . Marland Kitchenextromethorphan Polistirex (DELSYM PO) Take by mouth.    . ENSURE (ENSURE) Take 1 Can by mouth 2 (two) times daily between meals.     . ferrous sulfate 325 (65 FE) MG EC tablet Take 325 mg by mouth daily with breakfast.    . fluticasone (VERAMYST) 27.5 MCG/SPRAY nasal spray Place 2 sprays into the nose daily.    . folic acid (FOLVITE) 80353CG tablet Take 1 tablet (800 mcg total) by mouth daily. 30 tablet  1  . loratadine (CLARITIN) 10 MG tablet Take 1 tablet (10 mg total) by mouth daily. 30 tablet 0  . Nutritional Supplements (JUICE PLUS FIBRE PO) Take 1 capsule by mouth daily. Reported on 07/28/2015    . osimertinib mesylate (TAGRISSO) 80 MG tablet Take 1 tablet (80 mg total) by mouth daily. 30 tablet 2  . OVER THE COUNTER MEDICATION "Deep penetrating pain relief oil"    . ranitidine (ZANTAC) 300 MG tablet Take 1 tablet (300 mg total) by mouth at bedtime. 30 tablet 6  . TURMERIC PO Take 1  capsule by mouth 2 (two) times daily. Turmeric Superior 750 mg po bid     No current facility-administered medications for this visit.     SURGICAL HISTORY:  Past Surgical History:  Procedure Laterality Date  . IR GENERIC HISTORICAL  07/14/2015   IR RADIOLOGIST EVAL & MGMT 07/14/2015 Greggory Keen, MD [facility] INTERV RAD  . VIDEO BRONCHOSCOPY Bilateral 08/09/2014   Procedure: VIDEO BRONCHOSCOPY WITHOUT FLUORO;  Surgeon: [name], MD;  Location: [facility] ENDOSCOPY;  Service: Cardiopulmonary;  Laterality: Bilateral;  . VIDEO BRONCHOSCOPY Bilateral 07/06/2015   Procedure: VIDEO BRONCHOSCOPY WITHOUT FLUORO;  Surgeon: [name], MD;  Location: [facility] ENDOSCOPY;  Service: Cardiopulmonary;  Laterality: Bilateral;    REVIEW OF SYSTEMS:  A comprehensive review of systems was negative except for: Respiratory: positive for cough   PHYSICAL EXAMINATION: General appearance: alert, cooperative and no distress Head: Normocephalic, without obvious abnormality, atraumatic Neck: no adenopathy, no JVD, supple, symmetrical, trachea midline and thyroid not enlarged, symmetric, no tenderness/mass/nodules Lymph nodes: Cervical, supraclavicular, and axillary nodes normal. Resp: clear to auscultation bilaterally Back: symmetric, no curvature. ROM normal. No CVA tenderness. Cardio: regular rate and rhythm, S1, S2 normal, no murmur, click, rub or gallop GI: soft, non-tender; bowel sounds normal; no masses,  no organomegaly Extremities: extremities normal, atraumatic, no cyanosis or edema  ECOG PERFORMANCE STATUS: 1 - Symptomatic but completely ambulatory  Blood pressure (!) 98/59, pulse (!) 107, temperature 97.9 F (36.6 C), temperature source Oral, resp. rate 18, height '4\' 11"'$  (1.499 m), weight 107 lb 11.2 oz (48.9 kg), SpO2 100 %.  LABORATORY DATA: Lab Results  Component Value Date   WBC 6.5 10/09/2016   HGB 13.8 10/09/2016   HCT 41.9 10/09/2016   MCV 96.1 10/09/2016   PLT 271 10/09/2016       Chemistry      Component Value Date/Time   NA 139 10/09/2016 1033   K 4.1 10/09/2016 1033   CL 109 07/19/2015 0509   CO2 28 10/09/2016 1033   BUN 16.8 10/09/2016 1033   CREATININE 0.7 10/09/2016 1033      Component Value Date/Time   CALCIUM 8.9 10/09/2016 1033   ALKPHOS 52 10/09/2016 1033   AST 21 10/09/2016 1033   ALT 23 10/09/2016 1033   BILITOT 0.44 10/09/2016 1033       RADIOGRAPHIC STUDIES: No results found.  ASSESSMENT AND PLAN:  This is a very pleasant 46 years old Asian female with stage IV non-small cell lung cancer, adenocarcinoma with positive EGFR mutation with deletion in exon 43 diagnosed in July 2016 status post treatment with Tarceva for a total of 18 months but this was discontinued secondary to disease progression and development of T790M resistant mutation. The patient was started on treatment with Tagrisso 80 mg by mouth daily status post 4 months of treatment. She continues to tolerate this treatment fairly well. I recommended for the patient to continue her treatment with  Tagrisso. I would see her back for follow-up visit in one month for evaluation and repeat blood work. For the metastatic bone disease, she will continue her current treatment with Xgeva. She was advised to call immediately if she has any concerning symptoms in the interval. The patient voices understanding of current disease status and treatment options and is in agreement with the current care plan. All questions were answered. The patient knows to call the clinic with any problems, questions or concerns. We can certainly see the patient much sooner if necessary.  Disclaimer: This note was dictated with voice recognition software. Similar sounding words can inadvertently be transcribed and may not be corrected upon review.

## 2016-10-10 ENCOUNTER — Encounter: Payer: Self-pay | Admitting: Medical Oncology

## 2016-10-11 ENCOUNTER — Other Ambulatory Visit: Payer: Self-pay | Admitting: Medical Oncology

## 2016-10-11 DIAGNOSIS — C7972 Secondary malignant neoplasm of left adrenal gland: Secondary | ICD-10-CM

## 2016-10-11 DIAGNOSIS — R21 Rash and other nonspecific skin eruption: Secondary | ICD-10-CM

## 2016-10-11 DIAGNOSIS — C7931 Secondary malignant neoplasm of brain: Secondary | ICD-10-CM

## 2016-10-11 DIAGNOSIS — Z5111 Encounter for antineoplastic chemotherapy: Secondary | ICD-10-CM

## 2016-10-11 DIAGNOSIS — C3491 Malignant neoplasm of unspecified part of right bronchus or lung: Secondary | ICD-10-CM

## 2016-10-11 DIAGNOSIS — C7951 Secondary malignant neoplasm of bone: Secondary | ICD-10-CM

## 2016-10-11 MED ORDER — OSIMERTINIB MESYLATE 80 MG PO TABS: 80.0000 mg | ORAL_TABLET | Freq: Every day | ORAL | 2 refills | Status: DC

## 2016-10-12 ENCOUNTER — Telehealth: Payer: Self-pay | Admitting: *Deleted

## 2016-10-12 NOTE — Telephone Encounter (Signed)
Walk-in today.  "I worry about getting my Tagrisso medication.  It takes too long.  The nurse sent refill yesterday but the pharmacist [facility] does not have an order.  I ned refill by Tuesday to continue on Wednesday.  I have four pills left.  I was told to come in to notify you to call.  [phone] and I was given fax number [phone]."  Who is the pharmacist here that helped me get the [place]?  When I call there are so many options I do not know what to do.  I need the office help with refills."  This nurse left voice mail for [facility] Oral Chemotherapy Pharmacist, collaborative nurse and verbally informed patient of the Tagrisso ordering information.  Patient expecting call from collaborative with Tagrisso order update upon provider return 10-15-2016.  Called [phone] [employer] Pres[facility] [employer].  Transfer to representative option not offered until after automatic telephone order prompts "order cannot be processed".   Marland Kitchen      "All Tagrisso orders must be faxed to [phone] with a cover sheet signed by MD.  No electronic signatures allowed.   to [employer].    We do not accept electronic/escribe orders.  [employer] sends order to the pharmacy after we have processed.   Takes three to five days to process orders.    When patient is down to a ten-day supply, provider or patient should call for refill or fax a new order.      Additional refills are allowed with the orders but no more than a thirty day supply is allowed for each shipment.    For these additional refills, the provider or patient must call when patient is down to a ten-day supply to allow time to process.     Order are sent FED-EX.  Signature required to receive package."  Informed [employer] of patient needing shipment ASAP to continue daily as ordered Wednesday, October 17, 2016.  "We will need to be called two hours after fax has been faxed. Provide the 1. date, 2. time order was faxed and 3. the fax number  you faxed order for [place] to retrieve order from the fax que to try to expedite processing."  Call received from [facility] with [facility] requesting clarification of Tagrisso order received by [facility] on 10-09-2016 for this patient.  Advised to [place] to disregard this order.

## 2016-10-15 ENCOUNTER — Telehealth: Payer: Self-pay | Admitting: Pharmacist

## 2016-10-15 ENCOUNTER — Other Ambulatory Visit: Payer: Self-pay | Admitting: *Deleted

## 2016-10-15 DIAGNOSIS — Z5111 Encounter for antineoplastic chemotherapy: Secondary | ICD-10-CM

## 2016-10-15 DIAGNOSIS — R21 Rash and other nonspecific skin eruption: Secondary | ICD-10-CM

## 2016-10-15 DIAGNOSIS — C3491 Malignant neoplasm of unspecified part of right bronchus or lung: Secondary | ICD-10-CM

## 2016-10-15 DIAGNOSIS — C7931 Secondary malignant neoplasm of brain: Secondary | ICD-10-CM

## 2016-10-15 DIAGNOSIS — C7951 Secondary malignant neoplasm of bone: Secondary | ICD-10-CM

## 2016-10-15 DIAGNOSIS — C7972 Secondary malignant neoplasm of left adrenal gland: Secondary | ICD-10-CM

## 2016-10-15 MED ORDER — OSIMERTINIB MESYLATE 80 MG PO TABS: 80.0000 mg | ORAL_TABLET | Freq: Every day | ORAL | 2 refills | Status: DC

## 2016-10-15 NOTE — Telephone Encounter (Signed)
Oral Oncology Pharmacist Encounter  [place] prescription faxed to [facility] program at [phone]. I called [employer] at [phone] to ensure prescription receipt. They will rush Tagrisso for delivery to patient's home on 10/17/16.  I called and updated patient. She has Tagrisso doses for 10/2 and 10/3 and will not have a break in therapy.  Patient instructed to call [employer] for next fill. There are refills on prescription faxed today.  Oral Oncology Clinic will continue to follow.  Johny Drilling, PharmD, BCPS, BCOP 10/15/2016 12:55 PM Oral Oncology Clinic [phone]

## 2016-10-15 NOTE — Telephone Encounter (Signed)
Jess in Pharmacy requesting paper script for pt assistance with Tagrisso. Rx reprinted

## 2016-10-18 ENCOUNTER — Encounter: Payer: Self-pay | Admitting: *Deleted

## 2016-10-18 NOTE — Progress Notes (Signed)
[place] Work  CSW met with patient in [place] office.  Patient shared she needs to reapply for Medicaid now that she's incurred new medical expenses and is requesting assistance getting her paperwork together.  Patient speaks English, but shared she gets confused regarding paperwork.  CSW completed application and assisted patient in organizing her necessary paperwork.  Mrs. Deborah Foster plans to go to DSS office tomorrow to apply. Patient also concerned she has not received Tagrisso medication through the mail.  CSW contacted Deborah Foster, pharmacist.  Patient should have received medication by this time.  Patient has contacted pharmaceutical company three times, but unsure why she has not received package. CSW contacted pharmaceutical company and they reported she should receive Education officer, environmental today.  Maryjean Morn, MSW, LCSW, OSW-C Clinical Social Worker [facility] [phone]

## 2016-10-24 ENCOUNTER — Other Ambulatory Visit: Payer: Self-pay | Admitting: Medical Oncology

## 2016-10-24 DIAGNOSIS — C3491 Malignant neoplasm of unspecified part of right bronchus or lung: Secondary | ICD-10-CM

## 2016-10-25 ENCOUNTER — Other Ambulatory Visit (HOSPITAL_BASED_OUTPATIENT_CLINIC_OR_DEPARTMENT_OTHER): Payer: Self-pay

## 2016-10-25 ENCOUNTER — Ambulatory Visit (HOSPITAL_BASED_OUTPATIENT_CLINIC_OR_DEPARTMENT_OTHER): Payer: Self-pay

## 2016-10-25 ENCOUNTER — Encounter: Payer: Self-pay | Admitting: Internal Medicine

## 2016-10-25 VITALS — BP 106/56 | HR 100 | Temp 97.8°F | Resp 20

## 2016-10-25 DIAGNOSIS — C3491 Malignant neoplasm of unspecified part of right bronchus or lung: Secondary | ICD-10-CM

## 2016-10-25 DIAGNOSIS — C342 Malignant neoplasm of middle lobe, bronchus or lung: Secondary | ICD-10-CM

## 2016-10-25 DIAGNOSIS — M898X9 Other specified disorders of bone, unspecified site: Secondary | ICD-10-CM

## 2016-10-25 DIAGNOSIS — M9080 Osteopathy in diseases classified elsewhere, unspecified site: Secondary | ICD-10-CM

## 2016-10-25 DIAGNOSIS — C3431 Malignant neoplasm of lower lobe, right bronchus or lung: Secondary | ICD-10-CM

## 2016-10-25 DIAGNOSIS — C7951 Secondary malignant neoplasm of bone: Secondary | ICD-10-CM

## 2016-10-25 DIAGNOSIS — E889 Metabolic disorder, unspecified: Secondary | ICD-10-CM

## 2016-10-25 LAB — CBC WITH DIFFERENTIAL/PLATELET
BASO%: 0.1 % (ref 0.0–2.0)
Basophils Absolute: 0 10*3/uL (ref 0.0–0.1)
EOS%: 0.8 % (ref 0.0–7.0)
Eosinophils Absolute: 0.1 10*3/uL (ref 0.0–0.5)
HEMATOCRIT: 44.6 % (ref 34.8–46.6)
HEMOGLOBIN: 14.6 g/dL (ref 11.6–15.9)
LYMPH#: 1.1 10*3/uL (ref 0.9–3.3)
LYMPH%: 15.7 % (ref 14.0–49.7)
MCH: 31.7 pg (ref 25.1–34.0)
MCHC: 32.7 g/dL (ref 31.5–36.0)
MCV: 97 fL (ref 79.5–101.0)
MONO#: 0.6 10*3/uL (ref 0.1–0.9)
MONO%: 8.6 % (ref 0.0–14.0)
NEUT#: 5.3 10*3/uL (ref 1.5–6.5)
NEUT%: 74.8 % (ref 38.4–76.8)
PLATELETS: 295 10*3/uL (ref 145–400)
RBC: 4.6 10*6/uL (ref 3.70–5.45)
RDW: 13 % (ref 11.2–14.5)
WBC: 7.1 10*3/uL (ref 3.9–10.3)

## 2016-10-25 LAB — COMPREHENSIVE METABOLIC PANEL
ALBUMIN: 4 g/dL (ref 3.5–5.0)
ALT: 22 U/L (ref 0–55)
ANION GAP: 11 meq/L (ref 3–11)
AST: 19 U/L (ref 5–34)
Alkaline Phosphatase: 53 U/L (ref 40–150)
BILIRUBIN TOTAL: 0.39 mg/dL (ref 0.20–1.20)
BUN: 15.2 mg/dL (ref 7.0–26.0)
CALCIUM: 9.1 mg/dL (ref 8.4–10.4)
CHLORIDE: 104 meq/L (ref 98–109)
CO2: 25 meq/L (ref 22–29)
Creatinine: 0.8 mg/dL (ref 0.6–1.1)
GLUCOSE: 91 mg/dL (ref 70–140)
POTASSIUM: 4 meq/L (ref 3.5–5.1)
Sodium: 139 mEq/L (ref 136–145)
Total Protein: 7.6 g/dL (ref 6.4–8.3)

## 2016-10-25 MED ORDER — DENOSUMAB 120 MG/1.7ML ~~LOC~~ SOLN
120.0000 mg | Freq: Once | SUBCUTANEOUS | Status: AC
  Administered 2016-10-25: 120 mg via SUBCUTANEOUS
  Filled 2016-10-25: qty 1.7

## 2016-10-25 NOTE — Patient Instructions (Signed)
Denosumab injection  What is this medicine?  DENOSUMAB (den oh sue mab) slows bone breakdown. Prolia is used to treat osteoporosis in women after menopause and in men. Xgeva is used to prevent bone fractures and other bone problems caused by cancer bone metastases. Xgeva is also used to treat giant cell tumor of the bone.  This medicine may be used for other purposes; ask your health care provider or pharmacist if you have questions.  What should I tell my health care provider before I take this medicine?  They need to know if you have any of these conditions:  -dental disease  -eczema  -infection or history of infections  -kidney disease or on dialysis  -low blood calcium or vitamin D  -malabsorption syndrome  -scheduled to have surgery or tooth extraction  -taking medicine that contains denosumab  -thyroid or parathyroid disease  -an unusual reaction to denosumab, other medicines, foods, dyes, or preservatives  -pregnant or trying to get pregnant  -breast-feeding  How should I use this medicine?  This medicine is for injection under the skin. It is given by a health care professional in a hospital or clinic setting.  If you are getting Prolia, a special MedGuide will be given to you by the pharmacist with each prescription and refill. Be sure to read this information carefully each time.  For Prolia, talk to your pediatrician regarding the use of this medicine in children. Special care may be needed. For Xgeva, talk to your pediatrician regarding the use of this medicine in children. While this drug may be prescribed for children as young as 13 years for selected conditions, precautions do apply.  Overdosage: If you think you have taken too much of this medicine contact a poison control center or emergency room at once.  NOTE: This medicine is only for you. Do not share this medicine with others.  What if I miss a dose?  It is important not to miss your dose. Call your doctor or health care professional if you are  unable to keep an appointment.  What may interact with this medicine?  Do not take this medicine with any of the following medications:  -other medicines containing denosumab  This medicine may also interact with the following medications:  -medicines that suppress the immune system  -medicines that treat cancer  -steroid medicines like prednisone or cortisone  This list may not describe all possible interactions. Give your health care provider a list of all the medicines, herbs, non-prescription drugs, or dietary supplements you use. Also tell them if you smoke, drink alcohol, or use illegal drugs. Some items may interact with your medicine.  What should I watch for while using this medicine?  Visit your doctor or health care professional for regular checks on your progress. Your doctor or health care professional may order blood tests and other tests to see how you are doing.  Call your doctor or health care professional if you get a cold or other infection while receiving this medicine. Do not treat yourself. This medicine may decrease your body's ability to fight infection.  You should make sure you get enough calcium and vitamin D while you are taking this medicine, unless your doctor tells you not to. Discuss the foods you eat and the vitamins you take with your health care professional.  See your dentist regularly. Brush and floss your teeth as directed. Before you have any dental work done, tell your dentist you are receiving this medicine.  Do   not become pregnant while taking this medicine or for 5 months after stopping it. Women should inform their doctor if they wish to become pregnant or think they might be pregnant. There is a potential for serious side effects to an unborn child. Talk to your health care professional or pharmacist for more information.  What side effects may I notice from receiving this medicine?  Side effects that you should report to your doctor or health care professional as soon as  possible:  -allergic reactions like skin rash, itching or hives, swelling of the face, lips, or tongue  -breathing problems  -chest pain  -fast, irregular heartbeat  -feeling faint or lightheaded, falls  -fever, chills, or any other sign of infection  -muscle spasms, tightening, or twitches  -numbness or tingling  -skin blisters or bumps, or is dry, peels, or red  -slow healing or unexplained pain in the mouth or jaw  -unusual bleeding or bruising  Side effects that usually do not require medical attention (Report these to your doctor or health care professional if they continue or are bothersome.):  -muscle pain  -stomach upset, gas  This list may not describe all possible side effects. Call your doctor for medical advice about side effects. You may report side effects to FDA at 1-800-FDA-1088.  Where should I keep my medicine?  This medicine is only given in a clinic, doctor's office, or other health care setting and will not be stored at home.  NOTE: This sheet is a summary. It may not cover all possible information. If you have questions about this medicine, talk to your doctor, pharmacist, or health care provider.      2016, Elsevier/Gold Standard. (2011-07-02 12:37:47)

## 2016-10-25 NOTE — Progress Notes (Signed)
Patient came in, obtained signature for Rob for drug replacement for Xgeva.   Patient also had questions regarding Medicaid. Called the caseworker Mrs.Jodi Mourning whom number she had written down, to [place] states she mailed patient additional information to patient to complete and mail back and that she could not give any more information because she does not have a signed rep form from patient. Explained to patient while worker was on phone. Thanked Mrs.Harper and hung up.  Advised patient when she receives an envelope in the mail from [place], to complete and bring in so that I may help her complete a rep form and submit back to DSS. Also advised her to bring all of the paperwork she had with her today. She will call when she gets the packet in the mail so that we may schedule an appointment to meet. Patient verbalized understanding and has my card for any additional financial questions or concerns.

## 2016-10-29 ENCOUNTER — Ambulatory Visit: Payer: Self-pay

## 2016-10-29 ENCOUNTER — Encounter: Payer: Self-pay | Admitting: Internal Medicine

## 2016-10-29 NOTE — Progress Notes (Signed)
Patient came in to bring packet received from [place] for Medicaid. I assisted patient as best as I could and faxed forms to DSS. Patient also has to complete an interview over the phone. Left message for caseworker to contact patient to have this completed.  Patient has my contact information for any additional financial questions or concerns.

## 2016-11-02 ENCOUNTER — Encounter: Payer: Self-pay | Admitting: Pharmacy Technician

## 2016-11-02 NOTE — Progress Notes (Signed)
The patient is approved for drug assistance by [employer] for Xgeva with coverage 11/02/16 - 11/02/17. Enrollment is based on Self Pay. The next DOS within the enrollment period is 11/22/16.  Replacement drug may be request for outstanding bill dates as far back as 6 months.

## 2016-11-08 ENCOUNTER — Ambulatory Visit (HOSPITAL_BASED_OUTPATIENT_CLINIC_OR_DEPARTMENT_OTHER): Payer: Self-pay | Admitting: Oncology

## 2016-11-08 ENCOUNTER — Telehealth: Payer: Self-pay | Admitting: Oncology

## 2016-11-08 ENCOUNTER — Other Ambulatory Visit (HOSPITAL_BASED_OUTPATIENT_CLINIC_OR_DEPARTMENT_OTHER): Payer: Self-pay

## 2016-11-08 VITALS — BP 102/58 | HR 96 | Temp 97.7°F | Resp 18 | Ht 59.0 in | Wt 108.6 lb

## 2016-11-08 DIAGNOSIS — C7951 Secondary malignant neoplasm of bone: Secondary | ICD-10-CM

## 2016-11-08 DIAGNOSIS — C3491 Malignant neoplasm of unspecified part of right bronchus or lung: Secondary | ICD-10-CM

## 2016-11-08 DIAGNOSIS — Z5111 Encounter for antineoplastic chemotherapy: Secondary | ICD-10-CM

## 2016-11-08 DIAGNOSIS — R21 Rash and other nonspecific skin eruption: Secondary | ICD-10-CM

## 2016-11-08 DIAGNOSIS — C7972 Secondary malignant neoplasm of left adrenal gland: Secondary | ICD-10-CM

## 2016-11-08 DIAGNOSIS — C7931 Secondary malignant neoplasm of brain: Secondary | ICD-10-CM

## 2016-11-08 DIAGNOSIS — C342 Malignant neoplasm of middle lobe, bronchus or lung: Secondary | ICD-10-CM

## 2016-11-08 LAB — CBC WITH DIFFERENTIAL/PLATELET
BASO%: 0.2 % (ref 0.0–2.0)
Basophils Absolute: 0 10*3/uL (ref 0.0–0.1)
EOS%: 1.5 % (ref 0.0–7.0)
Eosinophils Absolute: 0.1 10*3/uL (ref 0.0–0.5)
HEMATOCRIT: 41.3 % (ref 34.8–46.6)
HEMOGLOBIN: 13.7 g/dL (ref 11.6–15.9)
LYMPH#: 1.1 10*3/uL (ref 0.9–3.3)
LYMPH%: 21.1 % (ref 14.0–49.7)
MCH: 31.8 pg (ref 25.1–34.0)
MCHC: 33.2 g/dL (ref 31.5–36.0)
MCV: 95.8 fL (ref 79.5–101.0)
MONO#: 0.6 10*3/uL (ref 0.1–0.9)
MONO%: 11.1 % (ref 0.0–14.0)
NEUT#: 3.5 10*3/uL (ref 1.5–6.5)
NEUT%: 66.1 % (ref 38.4–76.8)
PLATELETS: 251 10*3/uL (ref 145–400)
RBC: 4.31 10*6/uL (ref 3.70–5.45)
RDW: 12.9 % (ref 11.2–14.5)
WBC: 5.3 10*3/uL (ref 3.9–10.3)

## 2016-11-08 LAB — COMPREHENSIVE METABOLIC PANEL
ALT: 22 U/L (ref 0–55)
AST: 21 U/L (ref 5–34)
Albumin: 3.7 g/dL (ref 3.5–5.0)
Alkaline Phosphatase: 45 U/L (ref 40–150)
Anion Gap: 9 mEq/L (ref 3–11)
BILIRUBIN TOTAL: 0.41 mg/dL (ref 0.20–1.20)
BUN: 16.1 mg/dL (ref 7.0–26.0)
CALCIUM: 8.6 mg/dL (ref 8.4–10.4)
CHLORIDE: 106 meq/L (ref 98–109)
CO2: 26 mEq/L (ref 22–29)
CREATININE: 0.7 mg/dL (ref 0.6–1.1)
EGFR: >60 mL/min/{1.73_m2} (ref >60)
Glucose: 84 mg/dl (ref 70–140)
Potassium: 3.9 mEq/L (ref 3.5–5.1)
Sodium: 140 mEq/L (ref 136–145)
TOTAL PROTEIN: 6.8 g/dL (ref 6.4–8.3)

## 2016-11-08 MED FILL — raNITIdine HCL 300 MG TABS: 300 | 30 days supply | Qty: 30 | Fill #4

## 2016-11-08 NOTE — Assessment & Plan Note (Addendum)
This is a very pleasant 46 year old Asian female with stage IV non-small cell lung cancer, adenocarcinoma with positive EGFR mutation with deletion in exon 3 diagnosed in July 2016 status post treatment with Tarceva for a total of 18 months but this was discontinued secondary to disease progression and development of T790M resistant mutation. The patient was started on treatment with Tagrisso 80 mg by mouth daily status post 5 months of treatment. She continues to tolerate this treatment fairly well. I recommended for the patient to continue her treatment with Tagrisso. I would see her back for follow-up visit in one month for evaluation and repeat blood work. She will have a restaging CT scan of the chest, abdomen, pelvis prior to her next visit. For the metastatic bone disease, she will continue her current treatment with Xgeva. For pain, she will try over-the-counter Tylenol or ibuprofen. If this is not effective, she was instructed to call us.  She was advised to call immediately if she has any concerning symptoms in the interval. The patient voices understanding of current disease status and treatment options and is in agreement with the current care plan. All questions were answered. The patient knows to call the clinic with any problems, questions or concerns. We can certainly see the patient much sooner if necessary.

## 2016-11-08 NOTE — Progress Notes (Signed)
[facility] Follow up:    [name], MD [place] [address]   DIAGNOSIS: Stage IV (T2a, N1, M1b) non-small cell lung cancer, adenocarcinoma with positive EGFR mutation (deletion 71) presented with right middle lobe lung mass, right hilar adenopathy as well as metastatic disease to the bone, brain and left adrenal diagnosed in July 2016.  SUMMARY OF ONCOLOGIC HISTORY:  No history exists.   PRIOR THERAPY: 1) palliative radiotherapy to the brain as well as metastatic bone lesions in the pelvis. 2) Tarceva 150 mg by mouth daily started on 09/25/2014. Status post 5 months of treatment. 3) status post palliative radiotherapy to the right middle lobe lung mass for treatment of hemoptysis. 4) Tarceva 100 mg by mouth daily started 02/19/2015 status post 13 months of treatment. This was discontinued in May 2018 after the patient had disease progression and development of T790M resistant mutation.   CURRENT THERAPY: 1) Tagrisso 80 mg by mouth daily started 06/02/2016. 2) Xgeva 120 g subcutaneously on monthly basis.  INTERVAL HISTORY: Deborah Foster 46 y.o. female returns for a routine follow-up visit accompanied by an interpreter. The patient is feeling fine today with no specific complaints except for intermittent mid back pain and right shoulder pain that lasted for several days last week and has now resolved.  The patient reports that the pain comes and goes that she has not taken anything to help her pain. She reports that she was doing some cleaning at her church around the time of the pain. She continues to tolerate her treatment with Tagrisso fairly well except for mild cough. She denied having any recent weight loss or night sweats. She has no nausea, vomiting, diarrhea or constipation. The patient denied having any chest pain, shortness breath or hemoptysis. She denied having any fever or chills. She is here today for evaluation and repeat  blood work.   Patient Active Problem List   Diagnosis Date Noted  . Primary cancer of right lower lobe of lung ([place]) 07/22/2015  . Malnutrition of moderate degree 07/21/2015  . Hemoptysis   . Primary lung adenocarcinoma ([place])   . Odynophagia 07/05/2015  . Folic acid deficiency 42/87/6811  . B12 deficiency 06/06/2015  . Radiation pneumonitis with hemoptysis 06/06/2015  . Bone metastases ([place]) 01/20/2015  . Malignant neoplasm metastatic to left adrenal gland ([place]) 01/20/2015  . Bronchitis 11/22/2014  . Encounter for antineoplastic chemotherapy 11/02/2014  . Mucositis 10/22/2014  . Rash 10/08/2014  . Non-small cell carcinoma of lung, stage 4 ([place]) 08/26/2014  . Bone disease, metabolic 57/26/2035  . Brain metastases ([place])   . Palliative care encounter   . Adrenal mass, left ([place])   . CAP (community acquired pneumonia) 08/07/2014  . Pneumonia 08/07/2014  . Lung mass 08/07/2014    is allergic to other and doxycycline.  MEDICAL HISTORY: Past Medical History:  Diagnosis Date  . Bone metastasis ([place])   . Hemoptysis   . Hypokalemia   . lung ca dx'd 07/2014  . Lung mass   . Metastasis to adrenal gland ([place])   . Metastasis to brain ([place])   . Pneumonia   . Radiation 08/23/14-09/07/14   Brain/chest and left hip 30 Gy 12 Fx  . URI (upper respiratory infection) 02-02-2015    SURGICAL HISTORY: Past Surgical History:  Procedure Laterality Date  . IR GENERIC HISTORICAL  07/14/2015   IR RADIOLOGIST EVAL & MGMT 07/14/2015 Greggory Keen, MD [facility] INTERV RAD  . VIDEO BRONCHOSCOPY Bilateral  08/09/2014   Procedure: VIDEO BRONCHOSCOPY WITHOUT FLUORO;  Surgeon: [name], MD;  Location: [facility];  Service: Cardiopulmonary;  Laterality: Bilateral;  . VIDEO BRONCHOSCOPY Bilateral 07/06/2015   Procedure: VIDEO BRONCHOSCOPY WITHOUT FLUORO;  Surgeon: [name], MD;  Location: [facility];  Service: Cardiopulmonary;  Laterality: Bilateral;    SOCIAL HISTORY: Social History    Social History  . Marital status: Married    Spouse name: N/A  . Number of children: 3  . Years of education: N/A   Occupational History  . unemployed    Social History Main Topics  . Smoking status: Never Smoker  . Smokeless tobacco: Never Used  . Alcohol use No  . Drug use: No  . Sexual activity: Not Currently   Other Topics Concern  . Not on file   Social History Narrative   Patient moved to [place] in [employer].   Had been in [place] since that time.    FAMILY HISTORY: Family History  Problem Relation Age of Onset  . Hyperlipidemia Mother     Review of Systems  Constitutional: Negative.   HENT:  Negative.   Eyes: Negative.   Respiratory: Negative.   Cardiovascular: Negative.   Gastrointestinal: Negative.   Genitourinary: Negative.    Musculoskeletal:       Intermittent mid back pain and right shoulder pain that has now resolved.  Skin: Negative.   Neurological: Negative.   Hematological: Negative.   Psychiatric/Behavioral: Negative.       PHYSICAL EXAMINATION  ECOG PERFORMANCE STATUS: 1 - Symptomatic but completely ambulatory  Vitals:   11/08/16 0816  BP: (!) 102/58  Pulse: 96  Resp: 18  Temp: 97.7 F (36.5 C)  SpO2: 100%    Physical Exam  Constitutional: She is oriented to person, place, and time and well-developed, well-nourished, and in no distress. No distress.  HENT:  Head: Normocephalic and atraumatic.  Mouth/Throat: Oropharynx is clear and moist. No oropharyngeal exudate.  Eyes: Conjunctivae are normal. Right eye exhibits no discharge. Left eye exhibits no discharge. No scleral icterus.  Neck: Normal range of motion. Neck supple.  Cardiovascular: Normal rate, regular rhythm, normal heart sounds and intact distal pulses.   Pulmonary/Chest: Effort normal and breath sounds normal. No respiratory distress. She has no wheezes. She has no rales.  Abdominal: Soft. Bowel sounds are normal. She exhibits no distension and no mass.  There is no tenderness.  Musculoskeletal: Normal range of motion. She exhibits no edema or tenderness.  Lymphadenopathy:    She has no cervical adenopathy.  Neurological: She is alert and oriented to person, place, and time. She exhibits normal muscle tone. Gait normal. Coordination normal.  Skin: Skin is warm and dry. No rash noted. She is not diaphoretic. No erythema. No pallor.  Psychiatric: Mood, memory, affect and judgment normal.    LABORATORY DATA:  CBC    Component Value Date/Time   WBC 5.3 11/08/2016 0754   WBC 8.3 07/20/2015 0434   RBC 4.31 11/08/2016 0754   RBC 3.67 (L) 07/20/2015 0434   HGB 13.7 11/08/2016 0754   HCT 41.3 11/08/2016 0754   PLT 251 11/08/2016 0754   MCV 95.8 11/08/2016 0754   MCH 31.8 11/08/2016 0754   MCH 32.2 07/20/2015 0434   MCHC 33.2 11/08/2016 0754   MCHC 34.3 07/20/2015 0434   RDW 12.9 11/08/2016 0754   LYMPHSABS 1.1 11/08/2016 0754   MONOABS 0.6 11/08/2016 0754   EOSABS 0.1 11/08/2016 0754   BASOSABS 0.0 11/08/2016 0754  CMP     Component Value Date/Time   NA 140 11/08/2016 0754   K 3.9 11/08/2016 0754   CL 109 07/19/2015 0509   CO2 26 11/08/2016 0754   GLUCOSE 84 11/08/2016 0754   BUN 16.1 11/08/2016 0754   CREATININE 0.7 11/08/2016 0754   CALCIUM 8.6 11/08/2016 0754   PROT 6.8 11/08/2016 0754   ALBUMIN 3.7 11/08/2016 0754   AST 21 11/08/2016 0754   ALT 22 11/08/2016 0754   ALKPHOS 45 11/08/2016 0754   BILITOT 0.41 11/08/2016 0754   GFRNONAA >60 07/19/2015 0509   GFRAA >60 07/19/2015 0509    RADIOGRAPHIC STUDIES:  No results found.  ASSESSMENT and THERAPY PLAN:   Non-small cell carcinoma of lung, stage 4 (H[facility]) This is a very pleasant 46 year old Asian female with stage IV non-small cell lung cancer, adenocarcinoma with positive EGFR mutation with deletion in exon 19 diagnosed in July 2016 status post treatment with Tarceva for a total of 18 months but this was discontinued secondary to disease progression and  development of T790M resistant mutation. The patient was started on treatment with Tagrisso 80 mg by mouth daily status post 5 months of treatment. She continues to tolerate this treatment fairly well. I recommended for the patient to continue her treatment with Tagrisso. I would see her back for follow-up visit in one month for evaluation and repeat blood work. She will have a restaging CT scan of the chest, abdomen, pelvis prior to her next visit. For the metastatic bone disease, she will continue her current treatment with Xgeva. For pain, she will try over-the-counter Tylenol or ibuprofen. If this is not effective, she was instructed to call us.  She was advised to call immediately if she has any concerning symptoms in the interval. The patient voices understanding of current disease status and treatment options and is in agreement with the current care plan. All questions were answered. The patient knows to call the clinic with any problems, questions or concerns. We can certainly see the patient much sooner if necessary.   Orders Placed This Encounter  Procedures  . CT ABDOMEN PELVIS W CONTRAST    Standing Status:   Future    Standing Expiration Date:   11/08/2017    Order Specific Question:   If indicated for the ordered procedure, I authorize the administration of contrast media per Radiology protocol    Answer:   Yes    Order Specific Question:   Preferred imaging location?    Answer:   [facility]    Order Specific Question:   Radiology Contrast Protocol - do NOT remove file path    Answer:   _0 [email or web address]    Order Specific Question:   Reason for Exam additional comments    Answer:   lung cancer. restaging.    Order Specific Question:   Is patient pregnant?    Answer:   No  . CT CHEST W CONTRAST    Standing Status:   Future    Standing Expiration Date:   11/08/2017    Order Specific Question:   If indicated for the ordered procedure, I  authorize the administration of contrast media per Radiology protocol    Answer:   Yes    Order Specific Question:   Preferred imaging location?    Answer:   [facility]    Order Specific Question:   Radiology Contrast Protocol - do NOT remove file path    Answer:   _1 [email or web address]  Order Specific Question:   Reason for Exam additional comments    Answer:   lung cancer. restaging.    Order Specific Question:   Is patient pregnant?    Answer:   No  . CBC with Differential/Platelet    Standing Status:   Future    Standing Expiration Date:   11/08/2017  . Comprehensive metabolic panel    Standing Status:   Future    Standing Expiration Date:   11/08/2017    All questions were answered. The patient knows to call the clinic with any problems, questions or concerns. We can certainly see the patient much sooner if necessary.  Mikey Bussing, NP 11/08/2016

## 2016-11-08 NOTE — Telephone Encounter (Signed)
Gave avs and calendar for November  °

## 2016-11-22 ENCOUNTER — Other Ambulatory Visit (HOSPITAL_BASED_OUTPATIENT_CLINIC_OR_DEPARTMENT_OTHER): Payer: Self-pay

## 2016-11-22 ENCOUNTER — Ambulatory Visit (HOSPITAL_BASED_OUTPATIENT_CLINIC_OR_DEPARTMENT_OTHER): Payer: Self-pay

## 2016-11-22 VITALS — BP 102/50 | HR 106 | Temp 97.8°F | Resp 20

## 2016-11-22 DIAGNOSIS — E889 Metabolic disorder, unspecified: Secondary | ICD-10-CM

## 2016-11-22 DIAGNOSIS — C3491 Malignant neoplasm of unspecified part of right bronchus or lung: Secondary | ICD-10-CM

## 2016-11-22 DIAGNOSIS — C342 Malignant neoplasm of middle lobe, bronchus or lung: Secondary | ICD-10-CM

## 2016-11-22 DIAGNOSIS — M898X9 Other specified disorders of bone, unspecified site: Secondary | ICD-10-CM

## 2016-11-22 DIAGNOSIS — C7951 Secondary malignant neoplasm of bone: Secondary | ICD-10-CM

## 2016-11-22 DIAGNOSIS — M9080 Osteopathy in diseases classified elsewhere, unspecified site: Secondary | ICD-10-CM

## 2016-11-22 DIAGNOSIS — C3431 Malignant neoplasm of lower lobe, right bronchus or lung: Secondary | ICD-10-CM

## 2016-11-22 LAB — COMPREHENSIVE METABOLIC PANEL
ALBUMIN: 3.9 g/dL (ref 3.5–5.0)
ALK PHOS: 51 U/L (ref 40–150)
ALT: 23 U/L (ref 0–55)
AST: 20 U/L (ref 5–34)
Anion Gap: 8 mEq/L (ref 3–11)
BILIRUBIN TOTAL: 0.34 mg/dL (ref 0.20–1.20)
BUN: 15.7 mg/dL (ref 7.0–26.0)
CO2: 27 mEq/L (ref 22–29)
Calcium: 9 mg/dL (ref 8.4–10.4)
Chloride: 104 mEq/L (ref 98–109)
Creatinine: 0.7 mg/dL (ref 0.6–1.1)
EGFR: >60 mL/min/{1.73_m2} (ref >60)
GLUCOSE: 97 mg/dL (ref 70–140)
Potassium: 4.2 mEq/L (ref 3.5–5.1)
SODIUM: 139 meq/L (ref 136–145)
TOTAL PROTEIN: 7.4 g/dL (ref 6.4–8.3)

## 2016-11-22 LAB — CBC WITH DIFFERENTIAL/PLATELET
BASO%: 0.6 % (ref 0.0–2.0)
Basophils Absolute: 0 10*3/uL (ref 0.0–0.1)
EOS ABS: 0.1 10*3/uL (ref 0.0–0.5)
EOS%: 1.4 % (ref 0.0–7.0)
HCT: 44 % (ref 34.8–46.6)
HEMOGLOBIN: 14.7 g/dL (ref 11.6–15.9)
LYMPH%: 18.8 % (ref 14.0–49.7)
MCH: 31.8 pg (ref 25.1–34.0)
MCHC: 33.4 g/dL (ref 31.5–36.0)
MCV: 95 fL (ref 79.5–101.0)
MONO#: 0.5 10*3/uL (ref 0.1–0.9)
MONO%: 8.2 % (ref 0.0–14.0)
NEUT%: 71 % (ref 38.4–76.8)
NEUTROS ABS: 4 10*3/uL (ref 1.5–6.5)
Platelets: 302 10*3/uL (ref 145–400)
RBC: 4.63 10*6/uL (ref 3.70–5.45)
RDW: 13.3 % (ref 11.2–14.5)
WBC: 5.7 10*3/uL (ref 3.9–10.3)
lymph#: 1.1 10*3/uL (ref 0.9–3.3)

## 2016-11-22 MED ORDER — DENOSUMAB 120 MG/1.7ML ~~LOC~~ SOLN
120.0000 mg | Freq: Once | SUBCUTANEOUS | Status: AC
  Administered 2016-11-22: 120 mg via SUBCUTANEOUS
  Filled 2016-11-22: qty 1.7

## 2016-11-22 MED FILL — raNITIdine HCL 300 MG TABS: 300 | 30 days supply | Qty: 30 | Fill #5

## 2016-11-22 NOTE — Patient Instructions (Signed)
Denosumab injection  What is this medicine?  DENOSUMAB (den oh sue mab) slows bone breakdown. Prolia is used to treat osteoporosis in women after menopause and in men. Xgeva is used to prevent bone fractures and other bone problems caused by cancer bone metastases. Xgeva is also used to treat giant cell tumor of the bone.  This medicine may be used for other purposes; ask your health care provider or pharmacist if you have questions.  What should I tell my health care provider before I take this medicine?  They need to know if you have any of these conditions:  -dental disease  -eczema  -infection or history of infections  -kidney disease or on dialysis  -low blood calcium or vitamin D  -malabsorption syndrome  -scheduled to have surgery or tooth extraction  -taking medicine that contains denosumab  -thyroid or parathyroid disease  -an unusual reaction to denosumab, other medicines, foods, dyes, or preservatives  -pregnant or trying to get pregnant  -breast-feeding  How should I use this medicine?  This medicine is for injection under the skin. It is given by a health care professional in a hospital or clinic setting.  If you are getting Prolia, a special MedGuide will be given to you by the pharmacist with each prescription and refill. Be sure to read this information carefully each time.  For Prolia, talk to your pediatrician regarding the use of this medicine in children. Special care may be needed. For Xgeva, talk to your pediatrician regarding the use of this medicine in children. While this drug may be prescribed for children as young as 13 years for selected conditions, precautions do apply.  Overdosage: If you think you have taken too much of this medicine contact a poison control center or emergency room at once.  NOTE: This medicine is only for you. Do not share this medicine with others.  What if I miss a dose?  It is important not to miss your dose. Call your doctor or health care professional if you are  unable to keep an appointment.  What may interact with this medicine?  Do not take this medicine with any of the following medications:  -other medicines containing denosumab  This medicine may also interact with the following medications:  -medicines that suppress the immune system  -medicines that treat cancer  -steroid medicines like prednisone or cortisone  This list may not describe all possible interactions. Give your health care provider a list of all the medicines, herbs, non-prescription drugs, or dietary supplements you use. Also tell them if you smoke, drink alcohol, or use illegal drugs. Some items may interact with your medicine.  What should I watch for while using this medicine?  Visit your doctor or health care professional for regular checks on your progress. Your doctor or health care professional may order blood tests and other tests to see how you are doing.  Call your doctor or health care professional if you get a cold or other infection while receiving this medicine. Do not treat yourself. This medicine may decrease your body's ability to fight infection.  You should make sure you get enough calcium and vitamin D while you are taking this medicine, unless your doctor tells you not to. Discuss the foods you eat and the vitamins you take with your health care professional.  See your dentist regularly. Brush and floss your teeth as directed. Before you have any dental work done, tell your dentist you are receiving this medicine.  Do   not become pregnant while taking this medicine or for 5 months after stopping it. Women should inform their doctor if they wish to become pregnant or think they might be pregnant. There is a potential for serious side effects to an unborn child. Talk to your health care professional or pharmacist for more information.  What side effects may I notice from receiving this medicine?  Side effects that you should report to your doctor or health care professional as soon as  possible:  -allergic reactions like skin rash, itching or hives, swelling of the face, lips, or tongue  -breathing problems  -chest pain  -fast, irregular heartbeat  -feeling faint or lightheaded, falls  -fever, chills, or any other sign of infection  -muscle spasms, tightening, or twitches  -numbness or tingling  -skin blisters or bumps, or is dry, peels, or red  -slow healing or unexplained pain in the mouth or jaw  -unusual bleeding or bruising  Side effects that usually do not require medical attention (Report these to your doctor or health care professional if they continue or are bothersome.):  -muscle pain  -stomach upset, gas  This list may not describe all possible side effects. Call your doctor for medical advice about side effects. You may report side effects to FDA at 1-800-FDA-1088.  Where should I keep my medicine?  This medicine is only given in a clinic, doctor's office, or other health care setting and will not be stored at home.  NOTE: This sheet is a summary. It may not cover all possible information. If you have questions about this medicine, talk to your doctor, pharmacist, or health care provider.      2016, Elsevier/Gold Standard. (2011-07-02 12:37:47)

## 2016-11-28 ENCOUNTER — Other Ambulatory Visit: Payer: Self-pay | Admitting: Radiation Therapy

## 2016-11-28 DIAGNOSIS — C7949 Secondary malignant neoplasm of other parts of nervous system: Principal | ICD-10-CM

## 2016-11-28 DIAGNOSIS — C7931 Secondary malignant neoplasm of brain: Secondary | ICD-10-CM

## 2016-12-07 ENCOUNTER — Encounter (HOSPITAL_COMMUNITY): Payer: Self-pay | Admitting: Radiology

## 2016-12-07 ENCOUNTER — Ambulatory Visit (HOSPITAL_COMMUNITY)
Admission: RE | Admit: 2016-12-07 | Discharge: 2016-12-07 | Disposition: A | Discharge status: Home / Self Care | Payer: Self-pay | Source: Ambulatory Visit | Attending: Oncology | Admitting: Oncology

## 2016-12-07 DIAGNOSIS — C3491 Malignant neoplasm of unspecified part of right bronchus or lung: Secondary | ICD-10-CM | POA: Insufficient documentation

## 2016-12-07 DIAGNOSIS — Z5111 Encounter for antineoplastic chemotherapy: Secondary | ICD-10-CM | POA: Insufficient documentation

## 2016-12-07 DIAGNOSIS — C7951 Secondary malignant neoplasm of bone: Secondary | ICD-10-CM | POA: Insufficient documentation

## 2016-12-07 MED ORDER — IOPAMIDOL (ISOVUE-300) INJECTION 61%
INTRAVENOUS | Status: AC
  Filled 2016-12-07: qty 100

## 2016-12-07 MED ORDER — IOPAMIDOL (ISOVUE-300) INJECTION 61%
100.0000 mL | Freq: Once | INTRAVENOUS | Status: AC | PRN
  Administered 2016-12-07: 100 mL via INTRAVENOUS

## 2016-12-10 ENCOUNTER — Encounter: Payer: Self-pay | Admitting: Oncology

## 2016-12-10 ENCOUNTER — Ambulatory Visit: Payer: Self-pay | Admitting: Oncology

## 2016-12-10 ENCOUNTER — Other Ambulatory Visit: Payer: Self-pay

## 2016-12-10 ENCOUNTER — Ambulatory Visit (HOSPITAL_BASED_OUTPATIENT_CLINIC_OR_DEPARTMENT_OTHER): Payer: Self-pay | Admitting: Oncology

## 2016-12-10 ENCOUNTER — Other Ambulatory Visit (HOSPITAL_BASED_OUTPATIENT_CLINIC_OR_DEPARTMENT_OTHER): Payer: Self-pay

## 2016-12-10 ENCOUNTER — Telehealth: Payer: Self-pay | Admitting: Internal Medicine

## 2016-12-10 VITALS — BP 112/36 | HR 102 | Temp 98.2°F | Resp 17 | Ht 59.0 in | Wt 108.4 lb

## 2016-12-10 DIAGNOSIS — C3491 Malignant neoplasm of unspecified part of right bronchus or lung: Secondary | ICD-10-CM

## 2016-12-10 DIAGNOSIS — C342 Malignant neoplasm of middle lobe, bronchus or lung: Secondary | ICD-10-CM

## 2016-12-10 DIAGNOSIS — C7972 Secondary malignant neoplasm of left adrenal gland: Secondary | ICD-10-CM

## 2016-12-10 DIAGNOSIS — Z5111 Encounter for antineoplastic chemotherapy: Secondary | ICD-10-CM

## 2016-12-10 DIAGNOSIS — E279 Disorder of adrenal gland, unspecified: Secondary | ICD-10-CM

## 2016-12-10 DIAGNOSIS — C7951 Secondary malignant neoplasm of bone: Secondary | ICD-10-CM

## 2016-12-10 DIAGNOSIS — C7931 Secondary malignant neoplasm of brain: Secondary | ICD-10-CM

## 2016-12-10 LAB — CBC WITH DIFFERENTIAL/PLATELET
BASO%: 0.2 % (ref 0.0–2.0)
BASOS ABS: 0 10*3/uL (ref 0.0–0.1)
EOS ABS: 0.1 10*3/uL (ref 0.0–0.5)
EOS%: 1 % (ref 0.0–7.0)
HCT: 42.2 % (ref 34.8–46.6)
HGB: 13.9 g/dL (ref 11.6–15.9)
LYMPH%: 21.9 % (ref 14.0–49.7)
MCH: 31.5 pg (ref 25.1–34.0)
MCHC: 32.9 g/dL (ref 31.5–36.0)
MCV: 95.7 fL (ref 79.5–101.0)
MONO#: 0.4 10*3/uL (ref 0.1–0.9)
MONO%: 8.9 % (ref 0.0–14.0)
NEUT%: 68 % (ref 38.4–76.8)
NEUTROS ABS: 3.4 10*3/uL (ref 1.5–6.5)
Platelets: 268 10*3/uL (ref 145–400)
RBC: 4.41 10*6/uL (ref 3.70–5.45)
RDW: 12.8 % (ref 11.2–14.5)
WBC: 5 10*3/uL (ref 3.9–10.3)
lymph#: 1.1 10*3/uL (ref 0.9–3.3)

## 2016-12-10 LAB — COMPREHENSIVE METABOLIC PANEL
ALT: 23 U/L (ref 0–55)
AST: 22 U/L (ref 5–34)
Albumin: 3.6 g/dL (ref 3.5–5.0)
Alkaline Phosphatase: 45 U/L (ref 40–150)
Anion Gap: 9 mEq/L (ref 3–11)
BUN: 12.9 mg/dL (ref 7.0–26.0)
CO2: 26 meq/L (ref 22–29)
Calcium: 8.8 mg/dL (ref 8.4–10.4)
Chloride: 106 mEq/L (ref 98–109)
Creatinine: 0.7 mg/dL (ref 0.6–1.1)
GLUCOSE: 80 mg/dL (ref 70–140)
POTASSIUM: 3.9 meq/L (ref 3.5–5.1)
SODIUM: 141 meq/L (ref 136–145)
Total Bilirubin: 0.35 mg/dL (ref 0.20–1.20)
Total Protein: 6.9 g/dL (ref 6.4–8.3)

## 2016-12-10 MED ORDER — OSIMERTINIB MESYLATE 80 MG PO TABS: 80.0000 mg | ORAL_TABLET | Freq: Every day | ORAL | 2 refills | Status: DC

## 2016-12-10 NOTE — Assessment & Plan Note (Signed)
This is a very pleasant 46 year old Asian female with stage IV non-small cell lung cancer, adenocarcinoma with positive EGFR mutation with deletion in exon 44 diagnosed in July 2016 status post treatment with Tarceva for a total of 18 months but this was discontinued secondary to disease progression and development of T790M resistant mutation. The patient was started on treatment with Tagrisso 80 mg by mouth daily status post 94month of treatment. She continues to tolerate this treatment fairly well.  The patient was seen with Dr. MJulien Nordmann  CT scan results were discussed with the patient.  Explained to the patient that she has a mild increase in size of the left adrenal nodule which is worrisome for recurrent soft tissue metastasis.  Recommend that we continue on her current treatment with Tagrisso 80 mg daily.  We will watch the left adrenal mass closely on future scans.  If this area increases, we will consider radiation to this area.  For her left rib pain, we do not see anything on the scan that would cause her pain.  This could be musculoskeletal in nature.  Recommend that she try Tylenol as needed. For the metastatic bone disease, she will continue her current treatment with Xgeva.  Follow-up visit will be in one month for evaluation and repeat blood work.   She was advised to call immediately if she has any concerning symptoms in the interval. The patient voices understanding of current disease status and treatment options and is in agreement with the current care plan. All questions were answered. The patient knows to call the clinic with any problems, questions or concerns. We can certainly see the patient much sooner if necessary.

## 2016-12-10 NOTE — Telephone Encounter (Signed)
Scheduled appt per 11/26 - patient is aware of appts added and will pick up a new schedule next visit.

## 2016-12-10 NOTE — Progress Notes (Signed)
[facility] OFFICE PROGRESS NOTE  [name], MD [place] [address]  DIAGNOSIS: Stage IV (T2a, N1, M1b) non-small cell lung cancer, adenocarcinoma with positive EGFR mutation (deletion 47) presented with right middle lobe lung mass, right hilar adenopathy as well as metastatic disease to the bone, brain and left adrenal diagnosed in July 2016.  PRIOR THERAPY: 1) palliative radiotherapy to the brain as well as metastatic bone lesions in the pelvis. 2) Tarceva 150 mg by mouth daily started on 09/25/2014. Status post 5 months of treatment. 3) status post palliative radiotherapy to the right middle lobe lung mass for treatment of hemoptysis. 4) Tarceva 100 mg by mouth daily started 02/19/2015 status post 13 months of treatment. This was discontinued in May 2018 after the patient had disease progression and development of T790M resistant mutation.   CURRENT THERAPY: 1) Tagrisso 80 mg by mouth daily started 06/02/2016. 2) Xgeva 120 g subcutaneously on monthly basis.  INTERVAL HISTORY: Deborah Foster 46 y.o. female returns for routine follow-up visit accompanied by an interpreter.  The patient is feeling fine today with no specific complaints except for intermittent left lower rib pain.  Pain is worse when she coughs.  She has not taken anything to help her pain.  The patient remains on [place] and is tolerating it fairly well.  The patient denies fevers and chills.  Denies chest pain, shortness of breath, hemoptysis.  She does report an intermittent nonproductive cough.  Denies nausea, vomiting, constipation, diarrhea.  Denies skin rashes.  Denies recent weight loss or night sweats.  The patient is here for evaluation and repeat blood work.  MEDICAL HISTORY: Past Medical History:  Diagnosis Date  . Bone metastasis ([facility])   . Hemoptysis   . Hypokalemia   . lung ca dx'd 07/2014  . Lung mass   . Metastasis to adrenal gland ([facility])   . Metastasis to  brain ([facility])   . Pneumonia   . Radiation 08/23/14-09/07/14   Brain/chest and left hip 30 Gy 12 Fx  . URI (upper respiratory infection) 02-02-2015    ALLERGIES:  is allergic to other and doxycycline.  MEDICATIONS:  Current Outpatient Medications  Medication Sig Dispense Refill  . AMBULATORY NON FORMULARY MEDICATION Medication Name: MAGIC MOUTHWASH 2 % Viscous Lidocaine  Maalox Benadryl Susp.  Disp: 1:1:1 247m bottle Sig: 167mPO Swish & Spit  q 3-4hrs. PRN mouth sores (Patient not taking: Reported on 09/03/2016) 200 mL 3  . benzonatate (TESSALON) 100 MG capsule Take 1 capsule (100 mg total) by mouth every 8 (eight) hours as needed. 20 capsule 0  . Calcium Carbonate-Vitamin D (CALCIUM 500 + D) 500-125 MG-UNIT TABS Take 500 mg by mouth 2 (two) times daily.    . chlorpheniramine-HYDROcodone (TUSSIONEX) 10-8 MG/5ML SUER Take 5 mLs by mouth every 12 (twelve) hours as needed. (Patient not taking: Reported on 06/26/2016) 140 mL 0  . clindamycin (CLINDAGEL) 1 % gel Apply 1 application topically 2 (two) times daily.    . Cyanocobalamin (VITAMIN B-12) 2500 MCG SUBL Place 2,500 mcg under the tongue daily.    . Marland Kitchenextromethorphan Polistirex (DELSYM PO) Take by mouth.    . ENSURE (ENSURE) Take 1 Can by mouth 2 (two) times daily between meals.     . ferrous sulfate 325 (65 FE) MG EC tablet Take 325 mg by mouth daily with breakfast.    . fluticasone (VERAMYST) 27.5 MCG/SPRAY nasal spray Place 2 sprays into the nose daily.    .Marland Kitchen  folic acid (FOLVITE) 564 MCG tablet Take 1 tablet (800 mcg total) by mouth daily. 30 tablet 1  . loratadine (CLARITIN) 10 MG tablet Take 1 tablet (10 mg total) by mouth daily. 30 tablet 0  . Nutritional Supplements (JUICE PLUS FIBRE PO) Take 1 capsule by mouth daily. Reported on 07/28/2015    . osimertinib mesylate (TAGRISSO) 80 MG tablet Take 1 tablet (80 mg total) by mouth daily. 30 tablet 2  . OVER THE COUNTER MEDICATION "Deep penetrating pain relief oil"    . ranitidine (ZANTAC)  300 MG tablet Take 1 tablet (300 mg total) by mouth at bedtime. 30 tablet 6  . TURMERIC PO Take 1 capsule by mouth 2 (two) times daily. Turmeric Superior 750 mg po bid     No current facility-administered medications for this visit.     SURGICAL HISTORY:  Past Surgical History:  Procedure Laterality Date  . IR GENERIC HISTORICAL  07/14/2015   IR RADIOLOGIST EVAL & MGMT 07/14/2015 Greggory Keen, MD [facility] INTERV RAD  . VIDEO BRONCHOSCOPY Bilateral 08/09/2014   Procedure: VIDEO BRONCHOSCOPY WITHOUT FLUORO;  Surgeon: [name], MD;  Location: [facility] ENDOSCOPY;  Service: Cardiopulmonary;  Laterality: Bilateral;  . VIDEO BRONCHOSCOPY Bilateral 07/06/2015   Procedure: VIDEO BRONCHOSCOPY WITHOUT FLUORO;  Surgeon: [name], MD;  Location: [facility] ENDOSCOPY;  Service: Cardiopulmonary;  Laterality: Bilateral;    REVIEW OF SYSTEMS:   Review of Systems  Constitutional: Negative for appetite change, chills, fatigue, fever and unexpected weight change.  HENT:   Negative for mouth sores, nosebleeds, sore throat and trouble swallowing.   Eyes: Negative for eye problems and icterus.  Respiratory: Negative for hemoptysis, shortness of breath and wheezing.  Positive for cough. Cardiovascular: Negative for chest pain and leg swelling.  Gastrointestinal: Negative for abdominal pain, constipation, diarrhea, nausea and vomiting.  Genitourinary: Negative for bladder incontinence, difficulty urinating, dysuria, frequency and hematuria.   Musculoskeletal: Negative for back pain, gait problem, neck pain and neck stiffness. Positive for left rib pain. Skin: Negative for itching and rash.  Neurological: Negative for dizziness, extremity weakness, gait problem, headaches, light-headedness and seizures.  Hematological: Negative for adenopathy. Does not bruise/bleed easily.  Psychiatric/Behavioral: Negative for confusion, depression and sleep disturbance. The patient is not nervous/anxious.     PHYSICAL  EXAMINATION:  Blood pressure (!) 112/36, pulse (!) 102, temperature 98.2 F (36.8 C), temperature source Oral, resp. rate 17, height '4\' 11"'$  (1.499 m), weight 108 lb 6.4 oz (49.2 kg), SpO2 100 %.  ECOG PERFORMANCE STATUS: 1 - Symptomatic but completely ambulatory  Physical Exam  Constitutional: Oriented to person, place, and time and well-developed, well-nourished, and in no distress. No distress.  HENT:  Head: Normocephalic and atraumatic.  Mouth/Throat: Oropharynx is clear and moist. No oropharyngeal exudate.  Eyes: Conjunctivae are normal. Right eye exhibits no discharge. Left eye exhibits no discharge. No scleral icterus.  Neck: Normal range of motion. Neck supple.  Cardiovascular: Normal rate, regular rhythm, normal heart sounds and intact distal pulses.   Pulmonary/Chest: Effort normal and breath sounds normal. No respiratory distress. No wheezes. No rales.  Abdominal: Soft. Bowel sounds are normal. Exhibits no distension and no mass. There is no tenderness.  Musculoskeletal: Normal range of motion. Exhibits no edema.  Lymphadenopathy:    No cervical adenopathy.  Neurological: Alert and oriented to person, place, and time. Exhibits normal muscle tone. Gait normal. Coordination normal.  Skin: Skin is warm and dry. No rash noted. Not diaphoretic. No erythema. No pallor.  Psychiatric: Mood,  memory and judgment normal.  Vitals reviewed.  LABORATORY DATA: Lab Results  Component Value Date   WBC 5.0 12/10/2016   HGB 13.9 12/10/2016   HCT 42.2 12/10/2016   MCV 95.7 12/10/2016   PLT 268 12/10/2016      Chemistry      Component Value Date/Time   NA 141 12/10/2016 0838   K 3.9 12/10/2016 0838   CL 109 07/19/2015 0509   CO2 26 12/10/2016 0838   BUN 12.9 12/10/2016 0838   CREATININE 0.7 12/10/2016 0838      Component Value Date/Time   CALCIUM 8.8 12/10/2016 0838   ALKPHOS 45 12/10/2016 0838   AST 22 12/10/2016 0838   ALT 23 12/10/2016 0838   BILITOT 0.35 12/10/2016 0838        RADIOGRAPHIC STUDIES:  Ct Chest W Contrast  Result Date: 12/07/2016 CLINICAL DATA:  Metastatic non-small cell lung cancer EXAM: CT CHEST, ABDOMEN, AND PELVIS WITH CONTRAST TECHNIQUE: Multidetector CT imaging of the chest, abdomen and pelvis was performed following the standard protocol during bolus administration of intravenous contrast. CONTRAST:  166m ISOVUE-300 IOPAMIDOL (ISOVUE-300) INJECTION 61% COMPARISON:  09/03/2016 FINDINGS: CT CHEST FINDINGS Cardiovascular: Heart is normal in size.  No pericardial effusion. No evidence of thoracic aortic aneurysm. Mild prominence of the main pulmonary artery, raising the possibility of pulmonary arterial hypertension. Mediastinum/Nodes: No suspicious mediastinal, hilar, or axillary lymphadenopathy. Visualized thyroid is unremarkable. Lungs/Pleura: Radiation changes in the right perihilar region and medial right lower lobe. No suspicious pulmonary nodules. Trace right pleural fluid. No pneumothorax. Musculoskeletal: Stable sclerotic osseous metastases involving the sternum, T3 vertebral body, and bilateral ribs. CT ABDOMEN PELVIS FINDINGS Hepatobiliary: Liver is within normal limits. No suspicious/enhancing hepatic lesions. Gallbladder is unremarkable. Mild central intrahepatic ductal dilatation. Dilated common duct, measuring 16 mm, although smoothly tapering at the ampulla. Pancreas: Within normal limits. Spleen: Within normal limits. Adrenals/Urinary Tract: 12 x 11 mm left adrenal nodule (series 2/image 54), new. Right adrenal gland is within normal limits. Kidneys are within normal limits.  No hydronephrosis. Bladder is underdistended but unremarkable. Stomach/Bowel: Stomach is within normal limits. No evidence of bowel obstruction. Normal appendix (series 2/ image 86). Vascular/Lymphatic: No evidence of abdominal aortic aneurysm. Atherosclerotic calcifications of the abdominal aorta and branch vessels. Reproductive: Uterus is within normal limits. Right  ovary is within normal limits.  No left adnexal mass. Other: No abdominopelvic ascites. Musculoskeletal: Stable sclerotic osseous metastases involving the L1 vertebral body, left greater than right pelvis, and left proximal humerus. IMPRESSION: 12 mm left adrenal nodule, worrisome for recurrent soft tissue metastasis. Stable widespread osseous metastases in the visualized axial and appendicular skeleton, as above. Radiation changes in the right perihilar region and right lower lobe. Additional ancillary findings as above. Electronically Signed   By: SJulian HyM.D.   On: 12/07/2016 08:28   Ct Abdomen Pelvis W Contrast  Result Date: 12/07/2016 CLINICAL DATA:  Metastatic non-small cell lung cancer EXAM: CT CHEST, ABDOMEN, AND PELVIS WITH CONTRAST TECHNIQUE: Multidetector CT imaging of the chest, abdomen and pelvis was performed following the standard protocol during bolus administration of intravenous contrast. CONTRAST:  106mISOVUE-300 IOPAMIDOL (ISOVUE-300) INJECTION 61% COMPARISON:  09/03/2016 FINDINGS: CT CHEST FINDINGS Cardiovascular: Heart is normal in size.  No pericardial effusion. No evidence of thoracic aortic aneurysm. Mild prominence of the main pulmonary artery, raising the possibility of pulmonary arterial hypertension. Mediastinum/Nodes: No suspicious mediastinal, hilar, or axillary lymphadenopathy. Visualized thyroid is unremarkable. Lungs/Pleura: Radiation changes in the right perihilar region and medial right  lower lobe. No suspicious pulmonary nodules. Trace right pleural fluid. No pneumothorax. Musculoskeletal: Stable sclerotic osseous metastases involving the sternum, T3 vertebral body, and bilateral ribs. CT ABDOMEN PELVIS FINDINGS Hepatobiliary: Liver is within normal limits. No suspicious/enhancing hepatic lesions. Gallbladder is unremarkable. Mild central intrahepatic ductal dilatation. Dilated common duct, measuring 16 mm, although smoothly tapering at the ampulla. Pancreas:  Within normal limits. Spleen: Within normal limits. Adrenals/Urinary Tract: 12 x 11 mm left adrenal nodule (series 2/image 54), new. Right adrenal gland is within normal limits. Kidneys are within normal limits.  No hydronephrosis. Bladder is underdistended but unremarkable. Stomach/Bowel: Stomach is within normal limits. No evidence of bowel obstruction. Normal appendix (series 2/ image 86). Vascular/Lymphatic: No evidence of abdominal aortic aneurysm. Atherosclerotic calcifications of the abdominal aorta and branch vessels. Reproductive: Uterus is within normal limits. Right ovary is within normal limits.  No left adnexal mass. Other: No abdominopelvic ascites. Musculoskeletal: Stable sclerotic osseous metastases involving the L1 vertebral body, left greater than right pelvis, and left proximal humerus. IMPRESSION: 12 mm left adrenal nodule, worrisome for recurrent soft tissue metastasis. Stable widespread osseous metastases in the visualized axial and appendicular skeleton, as above. Radiation changes in the right perihilar region and right lower lobe. Additional ancillary findings as above. Electronically Signed   By: Julian Hy M.D.   On: 12/07/2016 08:28     ASSESSMENT/PLAN:  Non-small cell carcinoma of lung, stage 4 (HCC) This is a very pleasant 46 year old Asian female with stage IV non-small cell lung cancer, adenocarcinoma with positive EGFR mutation with deletion in exon 19 diagnosed in July 2016 status post treatment with Tarceva for a total of 18 months but this was discontinued secondary to disease progression and development of T790M resistant mutation. The patient was started on treatment with Tagrisso 80 mg by mouth daily status post 80month of treatment. She continues to tolerate this treatment fairly well.  The patient was seen with Dr. MJulien Nordmann  CT scan results were discussed with the patient.  Explained to the patient that she has a mild increase in size of the left adrenal  nodule which is worrisome for recurrent soft tissue metastasis.  Recommend that we continue on her current treatment with Tagrisso 80 mg daily.  We will watch the left adrenal mass closely on future scans.  If this area increases, we will consider radiation to this area.  For her left rib pain, we do not see anything on the scan that would cause her pain.  This could be musculoskeletal in nature.  Recommend that she try Tylenol as needed. For the metastatic bone disease, she will continue her current treatment with Xgeva.  Follow-up visit will be in one month for evaluation and repeat blood work.   She was advised to call immediately if she has any concerning symptoms in the interval. The patient voices understanding of current disease status and treatment options and is in agreement with the current care plan. All questions were answered. The patient knows to call the clinic with any problems, questions or concerns. We can certainly see the patient much sooner if necessary.  Orders Placed This Encounter  Procedures  . CBC with Differential/Platelet    Standing Status:   Standing    Number of Occurrences:   10    Standing Expiration Date:   12/10/2017  . Comprehensive metabolic panel    Standing Status:   Standing    Number of Occurrences:   10    Standing Expiration Date:  12/10/2017   Mikey Bussing, DNP, AGPCNP-BC, AOCNP 12/10/16  ADDENDUM: Hematology/Oncology Attending: I had a face-to-face encounter with the patient.  I recommended her care plan.  This is a very pleasant 46 years old Asian female with stage IV non-small cell lung cancer with positive for EGFR mutation with deletion exon 19 initially diagnosed in July 2016 status post treatment with Tarceva for almost 18 months before she developed disease progression in May 2018 and the patient is currently on treatment with Tagrisso 80 mg p.o. daily status post 6 months of treatment and has been tolerating this treatment fairly  well. She had repeat CT scan of the chest, abdomen and pelvis performed recently.  I personally and independently reviewed the scan results and discussed with the patient and her interpreter today. Her scan showed a stable disease except for small left adrenal gland nodule suspicious for metastatic disease. I recommended for the patient to continue her current treatment with Tagrisso for now.  We will continue to monitor the left adrenal nodule closely on upcoming imaging studies and if it showed any further increase with no other metastatic lesions, we would consider the patient for palliative radiotherapy to this area. The patient agreed to the current plan. She will come back for follow-up visit in 1 month for reevaluation and repeat blood work. She was advised to call immediately if she has any concerning symptoms in the interval.  Disclaimer: This note was dictated with voice recognition software. Similar sounding words can inadvertently be transcribed and may be missed upon review. Eilleen Kempf, MD 12/12/16

## 2016-12-13 ENCOUNTER — Ambulatory Visit
Admission: RE | Admit: 2016-12-13 | Discharge: 2016-12-13 | Disposition: A | Discharge status: Home / Self Care | Payer: Self-pay | Source: Ambulatory Visit | Attending: Radiation Oncology | Admitting: Radiation Oncology

## 2016-12-13 ENCOUNTER — Ambulatory Visit: Payer: Self-pay | Admitting: Radiation Oncology

## 2016-12-13 DIAGNOSIS — C7931 Secondary malignant neoplasm of brain: Secondary | ICD-10-CM

## 2016-12-13 DIAGNOSIS — C7949 Secondary malignant neoplasm of other parts of nervous system: Principal | ICD-10-CM

## 2016-12-13 MED ORDER — GADOBENATE DIMEGLUMINE 529 MG/ML IV SOLN
9.0000 mL | Freq: Once | INTRAVENOUS | Status: DC | PRN
Start: 2016-12-13 — End: 2016-12-14

## 2016-12-14 ENCOUNTER — Telehealth: Payer: Self-pay | Admitting: Medical Oncology

## 2016-12-14 ENCOUNTER — Other Ambulatory Visit: Payer: Self-pay | Admitting: Medical Oncology

## 2016-12-14 ENCOUNTER — Telehealth: Payer: Self-pay

## 2016-12-14 DIAGNOSIS — C7972 Secondary malignant neoplasm of left adrenal gland: Secondary | ICD-10-CM

## 2016-12-14 DIAGNOSIS — C7931 Secondary malignant neoplasm of brain: Secondary | ICD-10-CM

## 2016-12-14 DIAGNOSIS — C7951 Secondary malignant neoplasm of bone: Secondary | ICD-10-CM

## 2016-12-14 DIAGNOSIS — C3491 Malignant neoplasm of unspecified part of right bronchus or lung: Secondary | ICD-10-CM

## 2016-12-14 DIAGNOSIS — Z5111 Encounter for antineoplastic chemotherapy: Secondary | ICD-10-CM

## 2016-12-14 MED ORDER — OSIMERTINIB MESYLATE 80 MG PO TABS: 80.0000 mg | ORAL_TABLET | Freq: Every day | ORAL | 2 refills | Status: DC

## 2016-12-14 NOTE — Progress Notes (Signed)
Deborah Foster 46 y.o. man with  Stage IV T2a, N1, M1b NSCLC, adenocarcinoma of the right middle lobe with metastatic disease to bone and brain radiation completed 08-03-15, review 12-13-16 MRI brain w wo contrast, FU.   Headache:No Pain:No Nausea/Vomiting/Ataxia:Occasional nausea,does not need medication Visional changes(Blurred/Diplopia (double vision), blind spots and peripheral vision changes):No Ring in ears:Has ringing in her right ear sometimes. Skin change:Face improving using using clindamycin gel 1% prn. Fatigue:Having fatigue some days takes naps. Cognitive changes:Alert and oriented x 3 this morning. Reports she is forgetful.                                                 Weight:Appetite less at times unable to determine what she wants to eat. Wt Readings from Last 3 Encounters:  12/17/16 108 lb 12.8 oz (49.4 kg)  12/10/16 108 lb 6.4 oz (49.2 kg)  11/08/16 108 lb 9.6 oz (49.3 kg)  BP (!) 97/25 (BP Location: Right Arm, Patient Position: Sitting, Cuff Size: Normal)   Pulse (!) 105   Temp 97.9 F (36.6 C) (Oral)   Resp 18   Ht 4\' 11"  (1.499 m)   Wt 108 lb 12.8 oz (49.4 kg)   SpO2 100%   BMI 21.97 kg/m

## 2016-12-14 NOTE — Telephone Encounter (Signed)
[facility] with [place] [facility] [place] called that most recent rx of tagrisso signed by Mikey Bussing was declined. The reason is the DEA or NPI have to be on the Rx. They refused to take a verbal NPI over the phone, it has to be on the rx.

## 2016-12-14 NOTE — Telephone Encounter (Signed)
Faxed tagrisso to [facility].

## 2016-12-17 ENCOUNTER — Other Ambulatory Visit: Payer: Self-pay

## 2016-12-17 ENCOUNTER — Encounter: Payer: Self-pay | Admitting: Radiation Oncology

## 2016-12-17 ENCOUNTER — Ambulatory Visit
Admission: RE | Admit: 2016-12-17 | Discharge: 2016-12-17 | Disposition: A | Discharge status: Home / Self Care | Payer: Self-pay | Source: Ambulatory Visit | Attending: Radiation Oncology | Admitting: Radiation Oncology

## 2016-12-17 VITALS — BP 97/25 | HR 105 | Temp 97.9°F | Resp 18 | Ht 59.0 in | Wt 108.8 lb

## 2016-12-17 DIAGNOSIS — C7931 Secondary malignant neoplasm of brain: Secondary | ICD-10-CM | POA: Insufficient documentation

## 2016-12-17 DIAGNOSIS — Z8701 Personal history of pneumonia (recurrent): Secondary | ICD-10-CM | POA: Insufficient documentation

## 2016-12-17 DIAGNOSIS — Z79899 Other long term (current) drug therapy: Secondary | ICD-10-CM | POA: Insufficient documentation

## 2016-12-17 DIAGNOSIS — Z881 Allergy status to other antibiotic agents status: Secondary | ICD-10-CM | POA: Insufficient documentation

## 2016-12-17 DIAGNOSIS — C342 Malignant neoplasm of middle lobe, bronchus or lung: Secondary | ICD-10-CM | POA: Insufficient documentation

## 2016-12-17 DIAGNOSIS — C3491 Malignant neoplasm of unspecified part of right bronchus or lung: Secondary | ICD-10-CM

## 2016-12-17 DIAGNOSIS — C7951 Secondary malignant neoplasm of bone: Secondary | ICD-10-CM | POA: Insufficient documentation

## 2016-12-17 DIAGNOSIS — Z7951 Long term (current) use of inhaled steroids: Secondary | ICD-10-CM | POA: Insufficient documentation

## 2016-12-17 DIAGNOSIS — Z923 Personal history of irradiation: Secondary | ICD-10-CM | POA: Insufficient documentation

## 2016-12-17 NOTE — Progress Notes (Signed)
Radiation Oncology         [phone] ________________________________  Name: Deborah Foster MRN: [id number]  Date: 12/17/2016  DOB: 28-Dec-1970  Follow up Note  CC: [name], MD  [name], *  Diagnosis:   Stage IV T2a, N1, M1b NSCLC, adenocarcinoma of the right middle lobe with metastatic disease to bone and brain.  Interval Since Last Radiation:  27 months  07/21/2015 to 08/03/2015: The Right hilar tumor was treated to 30 Gy in 10 fractions at 3 Gy per fraction.   08/23/14-09/07/14: 30 Gy in 12 fractions was prescribed to the right lung, whole brain, and left hip.  Narrative:  The patient returns today for routine follow-up. Of note her history includes stage IV lung cancer which was identified in the summer of 2016. She received whole brain and right lung radiotherapy along the left hip treatment in August 2016. She's been followed in surveillance since, she recurred locally in the chest, and received radiotherapy in the summer of 2017. She comes today for follow-up of her CNS. Her most recent MRI of the brain on 12/13/16 revealed no evidence of new disease, and her treated lesions in the brain are stable. She continues with with Dr. Julien Nordmann with [facility] and [facility]. She's due for her [facility] this week. She also had a new nodule on her left adrenal gland with her last set of scans 2 weeks ago, and she will continue her current therapy, but perhaps consider radiation if this does not improve.   On review of systems, the patient reports that she is doing well overall. She denies any chest pain, shortness of breath, cough, fevers, chills, night sweats, unintended weight changes. She denies any bowel or bladder disturbances, and denies abdominal pain, nausea or vomiting.She denies any headaches, visual, auditory, or speech changes. She denies any progressive symptoms of this. She denies any new musculoskeletal or joint aches or pains, new skin lesions or concerns. A complete review  of systems is obtained and is otherwise negative.   Past Medical History:  Past Medical History:  Diagnosis Date  . Bone metastasis ([place])   . Hemoptysis   . Hypokalemia   . lung ca dx'd 07/2014  . Lung mass   . Metastasis to adrenal gland ([place])   . Metastasis to brain ([place])   . Pneumonia   . Radiation 08/23/14-09/07/14   Brain/chest and left hip 30 Gy 12 Fx  . URI (upper respiratory infection) 02-02-2015    Past Surgical History: Past Surgical History:  Procedure Laterality Date  . IR GENERIC HISTORICAL  07/14/2015   IR RADIOLOGIST EVAL & MGMT 07/14/2015 Greggory Keen, MD [facility] INTERV RAD  . VIDEO BRONCHOSCOPY Bilateral 08/09/2014   Procedure: VIDEO BRONCHOSCOPY WITHOUT FLUORO;  Surgeon: [name], MD;  [facility] ENDOSCOPY;  Service: Cardiopulmonary;  Laterality: Bilateral;  . VIDEO BRONCHOSCOPY Bilateral 07/06/2015   Procedure: VIDEO BRONCHOSCOPY WITHOUT FLUORO;  Surgeon: [name], MD;  Location: [facility] ENDOSCOPY;  Service: Cardiopulmonary;  Laterality: Bilateral;    Social History:  Social History   Socioeconomic History  . Marital status: Married    Spouse name: Not on file  . Number of children: 3  . Years of education: Not on file  . Highest education level: Not on file  Social Needs  . Financial resource strain: Not on file  . Food insecurity - worry: Not on file  . Food insecurity - inability: Not on file  . Transportation needs - medical: Not on  file  . Transportation needs - non-medical: Not on file  Occupational History  . Occupation: unemployed  Tobacco Use  . Smoking status: Never Smoker  . Smokeless tobacco: Never Used  Substance and Sexual Activity  . Alcohol use: No    Alcohol/week: 0.0 oz  . Drug use: No  . Sexual activity: Not Currently  Other Topics Concern  . Not on file  Social History Narrative   Patient moved to [place] in [place].   Had been in [place] since that time.  The patient has three children, and she is  active in her church.  Family History: Family History  Problem Relation Age of Onset  . Hyperlipidemia Mother     ALLERGIES:  is allergic to other and doxycycline.  Meds: Current Outpatient Medications  Medication Sig Dispense Refill  . benzonatate (TESSALON) 100 MG capsule Take 1 capsule (100 mg total) by mouth every 8 (eight) hours as needed. 20 capsule 0  . Calcium Carbonate-Vitamin D (CALCIUM 500 + D) 500-125 MG-UNIT TABS Take 500 mg by mouth 2 (two) times daily.    . chlorpheniramine-HYDROcodone (TUSSIONEX) 10-8 MG/5ML SUER Take 5 mLs by mouth every 12 (twelve) hours as needed. 140 mL 0  . Cyanocobalamin (VITAMIN B-12) 2500 MCG SUBL Place 2,500 mcg under the tongue daily.    Marland Kitchen Dextromethorphan Polistirex (DELSYM PO) Take by mouth.    . ENSURE (ENSURE) Take 1 Can by mouth 2 (two) times daily between meals.     . ferrous sulfate 325 (65 FE) MG EC tablet Take 325 mg by mouth daily with breakfast.    . folic acid (FOLVITE) 573 MCG tablet Take 1 tablet (800 mcg total) by mouth daily. 30 tablet 1  . loratadine (CLARITIN) 10 MG tablet Take 1 tablet (10 mg total) by mouth daily. 30 tablet 0  . Nutritional Supplements (JUICE PLUS FIBRE PO) Take 1 capsule by mouth daily. Reported on 07/28/2015    . osimertinib mesylate (TAGRISSO) 80 MG tablet Take 1 tablet (80 mg total) by mouth daily. 30 tablet 2  . ranitidine (ZANTAC) 300 MG tablet Take 1 tablet (300 mg total) by mouth at bedtime. 30 tablet 6  . TURMERIC PO Take 1 capsule by mouth 2 (two) times daily. Turmeric Superior 750 mg po bid    . AMBULATORY NON FORMULARY MEDICATION Medication Name: MAGIC MOUTHWASH 2 % Viscous Lidocaine  Maalox Benadryl Susp.  Disp: 1:1:1 224ml bottle Sig: 74ml PO Swish & Spit  q 3-4hrs. PRN mouth sores (Patient not taking: Reported on 09/03/2016) 200 mL 3  . clindamycin (CLINDAGEL) 1 % gel Apply 1 application topically 2 (two) times daily.    . fluticasone (VERAMYST) 27.5 MCG/SPRAY nasal spray Place 2 sprays into  the nose daily.    Marland Kitchen OVER THE COUNTER MEDICATION "Deep penetrating pain relief oil"     No current facility-administered medications for this encounter.     Physical Findings:  height is 4\' 11"  (1.499 m) and weight is 108 lb 12.8 oz (49.4 kg). Her oral temperature is 97.9 F (36.6 C). Her blood pressure is 97/25 (abnormal) and her pulse is 105 (abnormal). Her respiration is 18 and oxygen saturation is 100%.   Pain Assessment Pain Score: 0-No pain/10  In general this is a well appearing Asian female in no acute distress. She's alert and oriented x4 and appropriate throughout the examination. Cardiopulmonary assessment is negative for acute distress and she exhibits normal effort. She is neurologically intact grossly without change. Her hair  has filled in completely. She does not have any visible loss.   Lab Findings: Lab Results  Component Value Date   WBC 5.0 12/10/2016   HGB 13.9 12/10/2016   HCT 42.2 12/10/2016   MCV 95.7 12/10/2016   PLT 268 12/10/2016     Radiographic Findings: Ct Chest W Contrast  Result Date: 12/07/2016 CLINICAL DATA:  Metastatic non-small cell lung cancer EXAM: CT CHEST, ABDOMEN, AND PELVIS WITH CONTRAST TECHNIQUE: Multidetector CT imaging of the chest, abdomen and pelvis was performed following the standard protocol during bolus administration of intravenous contrast. CONTRAST:  151mL ISOVUE-300 IOPAMIDOL (ISOVUE-300) INJECTION 61% COMPARISON:  09/03/2016 FINDINGS: CT CHEST FINDINGS Cardiovascular: Heart is normal in size.  No pericardial effusion. No evidence of thoracic aortic aneurysm. Mild prominence of the main pulmonary artery, raising the possibility of pulmonary arterial hypertension. Mediastinum/Nodes: No suspicious mediastinal, hilar, or axillary lymphadenopathy. Visualized thyroid is unremarkable. Lungs/Pleura: Radiation changes in the right perihilar region and medial right lower lobe. No suspicious pulmonary nodules. Trace right pleural fluid. No  pneumothorax. Musculoskeletal: Stable sclerotic osseous metastases involving the sternum, T3 vertebral body, and bilateral ribs. CT ABDOMEN PELVIS FINDINGS Hepatobiliary: Liver is within normal limits. No suspicious/enhancing hepatic lesions. Gallbladder is unremarkable. Mild central intrahepatic ductal dilatation. Dilated common duct, measuring 16 mm, although smoothly tapering at the ampulla. Pancreas: Within normal limits. Spleen: Within normal limits. Adrenals/Urinary Tract: 12 x 11 mm left adrenal nodule (series 2/image 54), new. Right adrenal gland is within normal limits. Kidneys are within normal limits.  No hydronephrosis. Bladder is underdistended but unremarkable. Stomach/Bowel: Stomach is within normal limits. No evidence of bowel obstruction. Normal appendix (series 2/ image 86). Vascular/Lymphatic: No evidence of abdominal aortic aneurysm. Atherosclerotic calcifications of the abdominal aorta and branch vessels. Reproductive: Uterus is within normal limits. Right ovary is within normal limits.  No left adnexal mass. Other: No abdominopelvic ascites. Musculoskeletal: Stable sclerotic osseous metastases involving the L1 vertebral body, left greater than right pelvis, and left proximal humerus. IMPRESSION: 12 mm left adrenal nodule, worrisome for recurrent soft tissue metastasis. Stable widespread osseous metastases in the visualized axial and appendicular skeleton, as above. Radiation changes in the right perihilar region and right lower lobe. Additional ancillary findings as above. Electronically Signed   By: Julian Hy M.D.   On: 12/07/2016 08:28   Mr Jeri Cos KD Contrast  Result Date: 12/13/2016 CLINICAL DATA:  SRS 13 month restaging.  Metastatic lung cancer. EXAM: MRI HEAD WITHOUT AND WITH CONTRAST TECHNIQUE: Multiplanar, multiecho pulse sequences of the brain and surrounding structures were obtained without and with intravenous contrast. CONTRAST:  9 mL MultiHance. COMPARISON:  Multiple  priors, most recent 08/31/2016. FINDINGS: Brain: Mild atrophy without evidence for stroke or acute hemorrhage. No hydrocephalus. Eight previously treated lesions demonstrate susceptibility on gradient sequence, some showing T1 shortening, with variable degrees of enhancement but all stable to decreased in size compared with most recent previous scan. Compare index lesion in the LEFT temporal lobe, 11 x 17 mm (image 68), decreased from 13 x 19 mm previous. Near the vertex, punctate focus of susceptibility, series 6, image 22, stable as far back as 2016, likely incidental microbleed. There is an additional second punctate area of susceptibility, RIGHT parietal subcortical white matter, series 6, image 16, not seen previously. No associated enhancement. I suspect this is similarly an incidental microbleed, but continued surveillance warranted. Vascular: Normal flow voids. Skull and upper cervical spine: Unremarkable. Previously enhancing calvarial lesion LEFT parietal bone has regressed. Sinuses/Orbits: No layering fluid  or significant opacity. No orbital masses. Other: None. IMPRESSION: Eight previously treated lesions demonstrate variable blood products and enhancement, but overall are stable to improved compared with priors. Punctate focus of susceptibility RIGHT parietal subcortical white matter is not visible previously, likely incidental microbleed; see discussion above. Regression of previously noted LEFT calvarial lesion. Electronically Signed   By: Staci Righter M.D.   On: 12/13/2016 14:08   Ct Abdomen Pelvis W Contrast  Result Date: 12/07/2016 CLINICAL DATA:  Metastatic non-small cell lung cancer EXAM: CT CHEST, ABDOMEN, AND PELVIS WITH CONTRAST TECHNIQUE: Multidetector CT imaging of the chest, abdomen and pelvis was performed following the standard protocol during bolus administration of intravenous contrast. CONTRAST:  175mL ISOVUE-300 IOPAMIDOL (ISOVUE-300) INJECTION 61% COMPARISON:  09/03/2016  FINDINGS: CT CHEST FINDINGS Cardiovascular: Heart is normal in size.  No pericardial effusion. No evidence of thoracic aortic aneurysm. Mild prominence of the main pulmonary artery, raising the possibility of pulmonary arterial hypertension. Mediastinum/Nodes: No suspicious mediastinal, hilar, or axillary lymphadenopathy. Visualized thyroid is unremarkable. Lungs/Pleura: Radiation changes in the right perihilar region and medial right lower lobe. No suspicious pulmonary nodules. Trace right pleural fluid. No pneumothorax. Musculoskeletal: Stable sclerotic osseous metastases involving the sternum, T3 vertebral body, and bilateral ribs. CT ABDOMEN PELVIS FINDINGS Hepatobiliary: Liver is within normal limits. No suspicious/enhancing hepatic lesions. Gallbladder is unremarkable. Mild central intrahepatic ductal dilatation. Dilated common duct, measuring 16 mm, although smoothly tapering at the ampulla. Pancreas: Within normal limits. Spleen: Within normal limits. Adrenals/Urinary Tract: 12 x 11 mm left adrenal nodule (series 2/image 54), new. Right adrenal gland is within normal limits. Kidneys are within normal limits.  No hydronephrosis. Bladder is underdistended but unremarkable. Stomach/Bowel: Stomach is within normal limits. No evidence of bowel obstruction. Normal appendix (series 2/ image 86). Vascular/Lymphatic: No evidence of abdominal aortic aneurysm. Atherosclerotic calcifications of the abdominal aorta and branch vessels. Reproductive: Uterus is within normal limits. Right ovary is within normal limits.  No left adnexal mass. Other: No abdominopelvic ascites. Musculoskeletal: Stable sclerotic osseous metastases involving the L1 vertebral body, left greater than right pelvis, and left proximal humerus. IMPRESSION: 12 mm left adrenal nodule, worrisome for recurrent soft tissue metastasis. Stable widespread osseous metastases in the visualized axial and appendicular skeleton, as above. Radiation changes in the  right perihilar region and right lower lobe. Additional ancillary findings as above. Electronically Signed   By: Julian Hy M.D.   On: 12/07/2016 08:28    Impression/Plan: 1. Stage IV T2a, N1, M1b NSCLC, adenocarcinoma of the right middle lobe with metastatic disease to bone and brain. The patient will continue with Dr. Julien Nordmann and will have repeat staging scans in about 2 months. She will continue on [place] for now. Her MRI was reviewed as well and is stable and we will plan to repeat imaging in 3 months. 2. Left adrenal metastasis. The patients next systemic scan will dictate if we need to consider SBRT or palliative XRT to this site. We will follow up with this scan when available. 3. Hypotension. The patient is asymptomatic and we will follow this expectantly. She's encouraged to seek evaluation if she became symptomatic.     Carola Rhine, PAC

## 2016-12-20 ENCOUNTER — Ambulatory Visit (HOSPITAL_BASED_OUTPATIENT_CLINIC_OR_DEPARTMENT_OTHER): Payer: Self-pay

## 2016-12-20 ENCOUNTER — Other Ambulatory Visit (HOSPITAL_BASED_OUTPATIENT_CLINIC_OR_DEPARTMENT_OTHER): Payer: Self-pay

## 2016-12-20 VITALS — BP 118/63 | HR 113 | Temp 98.0°F | Resp 18

## 2016-12-20 DIAGNOSIS — C342 Malignant neoplasm of middle lobe, bronchus or lung: Secondary | ICD-10-CM

## 2016-12-20 DIAGNOSIS — C3491 Malignant neoplasm of unspecified part of right bronchus or lung: Secondary | ICD-10-CM

## 2016-12-20 DIAGNOSIS — C3431 Malignant neoplasm of lower lobe, right bronchus or lung: Secondary | ICD-10-CM

## 2016-12-20 DIAGNOSIS — M898X9 Other specified disorders of bone, unspecified site: Secondary | ICD-10-CM

## 2016-12-20 DIAGNOSIS — C7951 Secondary malignant neoplasm of bone: Secondary | ICD-10-CM

## 2016-12-20 DIAGNOSIS — E889 Metabolic disorder, unspecified: Secondary | ICD-10-CM

## 2016-12-20 DIAGNOSIS — M9080 Osteopathy in diseases classified elsewhere, unspecified site: Secondary | ICD-10-CM

## 2016-12-20 LAB — COMPREHENSIVE METABOLIC PANEL
ALT: 27 U/L (ref 0–55)
AST: 22 U/L (ref 5–34)
Albumin: 3.8 g/dL (ref 3.5–5.0)
Alkaline Phosphatase: 51 U/L (ref 40–150)
Anion Gap: 10 mEq/L (ref 3–11)
BUN: 13.4 mg/dL (ref 7.0–26.0)
CALCIUM: 9 mg/dL (ref 8.4–10.4)
CHLORIDE: 107 meq/L (ref 98–109)
CO2: 25 mEq/L (ref 22–29)
Creatinine: 0.7 mg/dL (ref 0.6–1.1)
EGFR: >60 mL/min/{1.73_m2} (ref >60)
GLUCOSE: 94 mg/dL (ref 70–140)
POTASSIUM: 4.2 meq/L (ref 3.5–5.1)
SODIUM: 142 meq/L (ref 136–145)
Total Bilirubin: 0.34 mg/dL (ref 0.20–1.20)
Total Protein: 7.1 g/dL (ref 6.4–8.3)

## 2016-12-20 LAB — CBC WITH DIFFERENTIAL/PLATELET
BASO%: 0.2 % (ref 0.0–2.0)
BASOS ABS: 0 10*3/uL (ref 0.0–0.1)
EOS%: 1.2 % (ref 0.0–7.0)
Eosinophils Absolute: 0.1 10*3/uL (ref 0.0–0.5)
HCT: 42.1 % (ref 34.8–46.6)
HGB: 13.7 g/dL (ref 11.6–15.9)
LYMPH%: 18.4 % (ref 14.0–49.7)
MCH: 31.4 pg (ref 25.1–34.0)
MCHC: 32.5 g/dL (ref 31.5–36.0)
MCV: 96.6 fL (ref 79.5–101.0)
MONO#: 0.6 10*3/uL (ref 0.1–0.9)
MONO%: 9.9 % (ref 0.0–14.0)
NEUT#: 4.1 10*3/uL (ref 1.5–6.5)
NEUT%: 70.3 % (ref 38.4–76.8)
Platelets: 267 10*3/uL (ref 145–400)
RBC: 4.36 10*6/uL (ref 3.70–5.45)
RDW: 13 % (ref 11.2–14.5)
WBC: 5.8 10*3/uL (ref 3.9–10.3)
lymph#: 1.1 10*3/uL (ref 0.9–3.3)

## 2016-12-20 MED ORDER — DENOSUMAB 120 MG/1.7ML ~~LOC~~ SOLN
120.0000 mg | Freq: Once | SUBCUTANEOUS | Status: AC
  Administered 2016-12-20: 120 mg via SUBCUTANEOUS
  Filled 2016-12-20: qty 1.7

## 2016-12-25 ENCOUNTER — Ambulatory Visit: Payer: Self-pay

## 2016-12-26 ENCOUNTER — Ambulatory Visit: Payer: Self-pay

## 2016-12-26 ENCOUNTER — Encounter: Payer: Self-pay | Admitting: Internal Medicine

## 2016-12-26 NOTE — Progress Notes (Signed)
Met with patient whom brought paperwork from DSS to discuss. The paperwork she brought had been previously submitted. Advised patient DSS will send something if any additional information is needed from her and when a decision has been made. Advised her that it takes 90 days for them to make a decision and she applied in October. She verbalized understanding.  °

## 2017-01-02 ENCOUNTER — Telehealth: Payer: Self-pay | Admitting: Pharmacy Technician

## 2017-01-02 NOTE — Telephone Encounter (Signed)
Oral Oncology Patient Advocate Encounter  Met patient in [place] to complete a renewal pplication for [employer] Patient Assistance Program in an effort to keep the patient's out of pocket expense for Tagrisso at $0.    Application completed and faxed to [phone].   [employer] patient assistance phone number for follow up is [phone].   This encounter will be updated until final determination.  Fabio Asa. Melynda Keller, [place] Patient [place] [phone] 01/02/2017 8:18 AM

## 2017-01-07 ENCOUNTER — Other Ambulatory Visit (HOSPITAL_BASED_OUTPATIENT_CLINIC_OR_DEPARTMENT_OTHER): Payer: Self-pay

## 2017-01-07 ENCOUNTER — Ambulatory Visit (HOSPITAL_BASED_OUTPATIENT_CLINIC_OR_DEPARTMENT_OTHER): Payer: Self-pay | Admitting: Internal Medicine

## 2017-01-07 ENCOUNTER — Telehealth: Payer: Self-pay | Admitting: Internal Medicine

## 2017-01-07 ENCOUNTER — Encounter: Payer: Self-pay | Admitting: Internal Medicine

## 2017-01-07 VITALS — BP 89/49 | HR 99 | Temp 97.7°F | Resp 16 | Ht 59.0 in | Wt 106.9 lb

## 2017-01-07 DIAGNOSIS — C7951 Secondary malignant neoplasm of bone: Secondary | ICD-10-CM

## 2017-01-07 DIAGNOSIS — C342 Malignant neoplasm of middle lobe, bronchus or lung: Secondary | ICD-10-CM

## 2017-01-07 DIAGNOSIS — C3491 Malignant neoplasm of unspecified part of right bronchus or lung: Secondary | ICD-10-CM

## 2017-01-07 DIAGNOSIS — I10 Essential (primary) hypertension: Secondary | ICD-10-CM

## 2017-01-07 LAB — CBC WITH DIFFERENTIAL/PLATELET
BASO%: 0.4 % (ref 0.0–2.0)
Basophils Absolute: 0 10*3/uL (ref 0.0–0.1)
EOS ABS: 0.2 10*3/uL (ref 0.0–0.5)
EOS%: 5.2 % (ref 0.0–7.0)
HCT: 43.2 % (ref 34.8–46.6)
HGB: 14.1 g/dL (ref 11.6–15.9)
LYMPH%: 22.1 % (ref 14.0–49.7)
MCH: 31.4 pg (ref 25.1–34.0)
MCHC: 32.6 g/dL (ref 31.5–36.0)
MCV: 96.2 fL (ref 79.5–101.0)
MONO#: 0.7 10*3/uL (ref 0.1–0.9)
MONO%: 16.2 % — AB (ref 0.0–14.0)
NEUT%: 56.1 % (ref 38.4–76.8)
NEUTROS ABS: 2.6 10*3/uL (ref 1.5–6.5)
PLATELETS: 265 10*3/uL (ref 145–400)
RBC: 4.49 10*6/uL (ref 3.70–5.45)
RDW: 12.9 % (ref 11.2–14.5)
WBC: 4.6 10*3/uL (ref 3.9–10.3)
lymph#: 1 10*3/uL (ref 0.9–3.3)

## 2017-01-07 LAB — COMPREHENSIVE METABOLIC PANEL
ALBUMIN: 3.8 g/dL (ref 3.5–5.0)
ALK PHOS: 57 U/L (ref 40–150)
ALT: 20 U/L (ref 0–55)
AST: 23 U/L (ref 5–34)
Anion Gap: 10 mEq/L (ref 3–11)
BUN: 10.9 mg/dL (ref 7.0–26.0)
CALCIUM: 8.8 mg/dL (ref 8.4–10.4)
CO2: 24 mEq/L (ref 22–29)
CREATININE: 0.7 mg/dL (ref 0.6–1.1)
Chloride: 104 mEq/L (ref 98–109)
EGFR: >60 mL/min/{1.73_m2} (ref >60)
GLUCOSE: 83 mg/dL (ref 70–140)
POTASSIUM: 4 meq/L (ref 3.5–5.1)
SODIUM: 139 meq/L (ref 136–145)
TOTAL PROTEIN: 7.2 g/dL (ref 6.4–8.3)
Total Bilirubin: 0.39 mg/dL (ref 0.20–1.20)

## 2017-01-07 NOTE — Progress Notes (Signed)
[place] [phone]   [phone]  OFFICE PROGRESS NOTE  [name], MD [place] [address]  DIAGNOSIS: Stage IV (T2a, N1, M1b) non-small cell lung cancer, adenocarcinoma with positive EGFR mutation (deletion 79) presented with right middle lobe lung mass, right hilar adenopathy as well as metastatic disease to the bone, brain and left adrenal diagnosed in July 2016.  PRIOR THERAPY: 1) palliative radiotherapy to the brain as well as metastatic bone lesions in the pelvis. 2) Tarceva 150 mg by mouth daily started on 09/25/2014. Status post 5 months of treatment. 3) status post palliative radiotherapy to the right middle lobe lung mass for treatment of hemoptysis. 4) Tarceva 100 mg by mouth daily started 02/19/2015 status post 13 months of treatment. This was discontinued in May 2018 after the patient had disease progression and development of T790M resistant mutation.   CURRENT THERAPY: 1) Tagrisso 80 mg by mouth daily started 06/02/2016. 2) Xgeva 120 g subcutaneously on monthly basis.  INTERVAL HISTORY: Deborah Foster 46 y.o. female returns to the clinic today for follow-up visit accompanied by her [place] interpreter.  The patient is feeling fine today with no specific complaints.  She was sick for a few days with cold symptoms.  She is feeling much better today.  She denied having any chest pain, shortness of breath, cough or hemoptysis.  She has no nausea, vomiting, diarrhea or constipation.  She has no fever or chills.  She continues to tolerate her treatment with Tagrisso fairly well.  She is here today for evaluation and repeat blood work.   MEDICAL HISTORY: Past Medical History:  Diagnosis Date  . Bone metastasis ([place])   . Hemoptysis   . Hypokalemia   . lung ca dx'd 07/2014  . Lung mass   . Metastasis to adrenal gland ([place])   . Metastasis to brain ([place])   . Pneumonia   . Radiation 08/23/14-09/07/14   Brain/chest and left hip 30 Gy 12 Fx  . URI (upper respiratory infection) 02-02-2015    ALLERGIES:  is allergic to other and doxycycline.  MEDICATIONS:  Current Outpatient Medications  Medication Sig Dispense Refill  . AMBULATORY NON FORMULARY MEDICATION Medication Name: MAGIC MOUTHWASH 2 % Viscous Lidocaine  Maalox Benadryl Susp.  Disp: 1:1:1 276m bottle Sig: 160mPO Swish & Spit  q 3-4hrs. PRN mouth sores 200 mL 3  . benzonatate (TESSALON) 100 MG capsule Take 1 capsule (100 mg total) by mouth every 8 (eight) hours as needed. 20 capsule 0  . Calcium Carbonate-Vitamin D (CALCIUM 500 + D) 500-125 MG-UNIT TABS Take 500 mg by mouth 2 (two) times daily.    . chlorpheniramine-HYDROcodone (TUSSIONEX) 10-8 MG/5ML SUER Take 5 mLs by mouth every 12 (twelve) hours as needed. 140 mL 0  . clindamycin (CLINDAGEL) 1 % gel Apply 1 application topically 2 (two) times daily.    . Cyanocobalamin (VITAMIN B-12) 2500 MCG SUBL Place 2,500 mcg under the tongue daily.    . Marland Kitchenextromethorphan Polistirex (DELSYM PO) Take by mouth.    . ENSURE (ENSURE) Take 1 Can by mouth 2 (two) times daily between meals.     . ferrous sulfate 325 (65 FE) MG EC tablet Take 325 mg by mouth daily with breakfast.    . fluticasone (VERAMYST) 27.5 MCG/SPRAY nasal spray Place 2 sprays into the nose daily.    . folic acid (FOLVITE) 80858CG tablet Take 1 tablet (800 mcg total) by mouth daily.  30 tablet 1  . loratadine (CLARITIN) 10 MG tablet Take 1 tablet (10 mg total) by mouth daily. 30 tablet 0  . Nutritional Supplements (JUICE PLUS FIBRE PO) Take 1 capsule by mouth daily. Reported on 07/28/2015    . osimertinib mesylate (TAGRISSO) 80 MG tablet Take 1 tablet (80 mg total) by mouth daily. 30 tablet 2  . OVER THE COUNTER MEDICATION "Deep penetrating pain relief oil"    . ranitidine (ZANTAC) 300 MG tablet Take 1 tablet (300 mg total) by mouth at bedtime. 30 tablet 6  . TURMERIC PO Take 1 capsule by mouth 2 (two) times daily. Turmeric  Superior 750 mg po bid    . Turmeric POWD Take by mouth.     No current facility-administered medications for this visit.     SURGICAL HISTORY:  Past Surgical History:  Procedure Laterality Date  . IR GENERIC HISTORICAL  07/14/2015   IR RADIOLOGIST EVAL & MGMT 07/14/2015 Greggory Keen, MD [facility] INTERV RAD  . VIDEO BRONCHOSCOPY Bilateral 08/09/2014   Procedure: VIDEO BRONCHOSCOPY WITHOUT FLUORO;  Surgeon: [name], MD;  Location: [facility] ENDOSCOPY;  Service: Cardiopulmonary;  Laterality: Bilateral;  . VIDEO BRONCHOSCOPY Bilateral 07/06/2015   Procedure: VIDEO BRONCHOSCOPY WITHOUT FLUORO;  Surgeon: [name], MD;  Location: [facility] ENDOSCOPY;  Service: Cardiopulmonary;  Laterality: Bilateral;    REVIEW OF SYSTEMS:  A comprehensive review of systems was negative.   PHYSICAL EXAMINATION: General appearance: alert, cooperative and no distress Head: Normocephalic, without obvious abnormality, atraumatic Neck: no adenopathy, no JVD, supple, symmetrical, trachea midline and thyroid not enlarged, symmetric, no tenderness/mass/nodules Lymph nodes: Cervical, supraclavicular, and axillary nodes normal. Resp: clear to auscultation bilaterally Back: symmetric, no curvature. ROM normal. No CVA tenderness. Cardio: regular rate and rhythm, S1, S2 normal, no murmur, click, rub or gallop GI: soft, non-tender; bowel sounds normal; no masses,  no organomegaly Extremities: extremities normal, atraumatic, no cyanosis or edema  ECOG PERFORMANCE STATUS: 1 - Symptomatic but completely ambulatory  Blood pressure (!) 89/49, pulse 99, temperature 97.7 F (36.5 C), temperature source Oral, resp. rate 16, height '4\' 11"'$  (1.499 m), weight 106 lb 14.4 oz (48.5 kg), SpO2 100 %.  LABORATORY DATA: Lab Results  Component Value Date   WBC 4.6 01/07/2017   HGB 14.1 01/07/2017   HCT 43.2 01/07/2017   MCV 96.2 01/07/2017   PLT 265 01/07/2017      Chemistry      Component Value Date/Time   NA 139 01/07/2017  0812   K 4.0 01/07/2017 0812   CL 109 07/19/2015 0509   CO2 24 01/07/2017 0812   BUN 10.9 01/07/2017 0812   CREATININE 0.7 01/07/2017 0812      Component Value Date/Time   CALCIUM 8.8 01/07/2017 0812   ALKPHOS 57 01/07/2017 0812   AST 23 01/07/2017 0812   ALT 20 01/07/2017 0812   BILITOT 0.39 01/07/2017 9937       RADIOGRAPHIC STUDIES: Mr Jeri Cos JI Contrast  Result Date: 12/13/2016 CLINICAL DATA:  SRS 13 month restaging.  Metastatic lung cancer. EXAM: MRI HEAD WITHOUT AND WITH CONTRAST TECHNIQUE: Multiplanar, multiecho pulse sequences of the brain and surrounding structures were obtained without and with intravenous contrast. CONTRAST:  9 mL MultiHance. COMPARISON:  Multiple priors, most recent 08/31/2016. FINDINGS: Brain: Mild atrophy without evidence for stroke or acute hemorrhage. No hydrocephalus. Eight previously treated lesions demonstrate susceptibility on gradient sequence, some showing T1 shortening, with variable degrees of enhancement but all stable to decreased in size compared with  most recent previous scan. Compare index lesion in the LEFT temporal lobe, 11 x 17 mm (image 68), decreased from 13 x 19 mm previous. Near the vertex, punctate focus of susceptibility, series 6, image 22, stable as far back as 2016, likely incidental microbleed. There is an additional second punctate area of susceptibility, RIGHT parietal subcortical white matter, series 6, image 16, not seen previously. No associated enhancement. I suspect this is similarly an incidental microbleed, but continued surveillance warranted. Vascular: Normal flow voids. Skull and upper cervical spine: Unremarkable. Previously enhancing calvarial lesion LEFT parietal bone has regressed. Sinuses/Orbits: No layering fluid or significant opacity. No orbital masses. Other: None. IMPRESSION: Eight previously treated lesions demonstrate variable blood products and enhancement, but overall are stable to improved compared with  priors. Punctate focus of susceptibility RIGHT parietal subcortical white matter is not visible previously, likely incidental microbleed; see discussion above. Regression of previously noted LEFT calvarial lesion. Electronically Signed   By: Staci Righter M.D.   On: 12/13/2016 14:08    ASSESSMENT AND PLAN:  This is a very pleasant 46 years old Asian female with stage IV non-small cell lung cancer, adenocarcinoma with positive EGFR mutation with deletion in exon 19 diagnosed in July 2016 status post treatment with Tarceva for a total of 18 months but this was discontinued secondary to disease progression and development of T790M resistant mutation. The patient was started on treatment with Tagrisso 80 mg by mouth daily status post 5 months of treatment. The patient continues to tolerate her treatment fairly well with no significant adverse effects.  I recommended for her to continue her treatment with Tagrisso with the same dose. I will see her back for follow-up visit in 1 month for evaluation with repeat blood work. For hypotension, she was advised to increase her oral intake. For the metastatic bone disease, she will continue her current treatment with Xgeva. The patient was advised to call immediately if she has any concerning symptoms in the interval. The patient voices understanding of current disease status and treatment options and is in agreement with the current care plan. All questions were answered. The patient knows to call the clinic with any problems, questions or concerns. We can certainly see the patient much sooner if necessary.  Disclaimer: This note was dictated with voice recognition software. Similar sounding words can inadvertently be transcribed and may not be corrected upon review.

## 2017-01-07 NOTE — Telephone Encounter (Signed)
No additional appts scheduled per 12/24 los - appts already scheduled - gave patient AVS and calender per los.

## 2017-01-17 ENCOUNTER — Other Ambulatory Visit (HOSPITAL_BASED_OUTPATIENT_CLINIC_OR_DEPARTMENT_OTHER): Payer: Medicare Other

## 2017-01-17 ENCOUNTER — Ambulatory Visit (HOSPITAL_BASED_OUTPATIENT_CLINIC_OR_DEPARTMENT_OTHER): Payer: Medicare Other

## 2017-01-17 VITALS — BP 99/54 | HR 99 | Temp 97.7°F | Resp 16

## 2017-01-17 DIAGNOSIS — C3431 Malignant neoplasm of lower lobe, right bronchus or lung: Secondary | ICD-10-CM

## 2017-01-17 DIAGNOSIS — M898X9 Other specified disorders of bone, unspecified site: Secondary | ICD-10-CM

## 2017-01-17 DIAGNOSIS — C342 Malignant neoplasm of middle lobe, bronchus or lung: Secondary | ICD-10-CM

## 2017-01-17 DIAGNOSIS — C7951 Secondary malignant neoplasm of bone: Secondary | ICD-10-CM

## 2017-01-17 DIAGNOSIS — C3491 Malignant neoplasm of unspecified part of right bronchus or lung: Secondary | ICD-10-CM

## 2017-01-17 DIAGNOSIS — E889 Metabolic disorder, unspecified: Secondary | ICD-10-CM

## 2017-01-17 DIAGNOSIS — M9080 Osteopathy in diseases classified elsewhere, unspecified site: Secondary | ICD-10-CM

## 2017-01-17 LAB — CBC WITH DIFFERENTIAL/PLATELET
BASO%: 0.5 % (ref 0.0–2.0)
BASOS ABS: 0 10*3/uL (ref 0.0–0.1)
EOS%: 1.3 % (ref 0.0–7.0)
Eosinophils Absolute: 0.1 10*3/uL (ref 0.0–0.5)
HEMATOCRIT: 39.6 % (ref 34.8–46.6)
HEMOGLOBIN: 12.9 g/dL (ref 11.6–15.9)
LYMPH#: 1.4 10*3/uL (ref 0.9–3.3)
LYMPH%: 23.3 % (ref 14.0–49.7)
MCH: 31.2 pg (ref 25.1–34.0)
MCHC: 32.6 g/dL (ref 31.5–36.0)
MCV: 95.7 fL (ref 79.5–101.0)
MONO#: 0.6 10*3/uL (ref 0.1–0.9)
MONO%: 9.3 % (ref 0.0–14.0)
NEUT%: 65.6 % (ref 38.4–76.8)
NEUTROS ABS: 4 10*3/uL (ref 1.5–6.5)
Platelets: 258 10*3/uL (ref 145–400)
RBC: 4.14 10*6/uL (ref 3.70–5.45)
RDW: 13 % (ref 11.2–14.5)
WBC: 6.1 10*3/uL (ref 3.9–10.3)

## 2017-01-17 LAB — COMPREHENSIVE METABOLIC PANEL
ALBUMIN: 3.6 g/dL (ref 3.5–5.0)
ALK PHOS: 50 U/L (ref 40–150)
ALT: 27 U/L (ref 0–55)
AST: 23 U/L (ref 5–34)
Anion Gap: 6 mEq/L (ref 3–11)
BILIRUBIN TOTAL: 0.46 mg/dL (ref 0.20–1.20)
BUN: 14.8 mg/dL (ref 7.0–26.0)
CO2: 29 mEq/L (ref 22–29)
CREATININE: 0.8 mg/dL (ref 0.6–1.1)
Calcium: 8.7 mg/dL (ref 8.4–10.4)
Chloride: 105 mEq/L (ref 98–109)
EGFR: >60 mL/min/{1.73_m2} (ref >60)
GLUCOSE: 69 mg/dL — AB (ref 70–140)
POTASSIUM: 3.9 meq/L (ref 3.5–5.1)
Sodium: 140 mEq/L (ref 136–145)
TOTAL PROTEIN: 6.4 g/dL (ref 6.4–8.3)

## 2017-01-17 MED ORDER — DENOSUMAB 120 MG/1.7ML ~~LOC~~ SOLN
120.0000 mg | Freq: Once | SUBCUTANEOUS | Status: AC
  Administered 2017-01-17: 120 mg via SUBCUTANEOUS

## 2017-01-23 MED FILL — raNITIdine HCL 300 MG TABS: 300 | 30 days supply | Qty: 30 | Fill #5

## 2017-02-07 ENCOUNTER — Telehealth: Payer: Self-pay | Admitting: Internal Medicine

## 2017-02-07 ENCOUNTER — Encounter: Payer: Self-pay | Admitting: Internal Medicine

## 2017-02-07 ENCOUNTER — Inpatient Hospital Stay: Payer: Medicare Other

## 2017-02-07 ENCOUNTER — Encounter: Payer: Self-pay | Admitting: General Practice

## 2017-02-07 ENCOUNTER — Other Ambulatory Visit: Payer: Self-pay | Admitting: Internal Medicine

## 2017-02-07 ENCOUNTER — Other Ambulatory Visit: Payer: Self-pay | Admitting: Pharmacist

## 2017-02-07 ENCOUNTER — Inpatient Hospital Stay: Payer: Medicare Other | Attending: Internal Medicine | Admitting: Internal Medicine

## 2017-02-07 DIAGNOSIS — Z79899 Other long term (current) drug therapy: Secondary | ICD-10-CM | POA: Diagnosis not present

## 2017-02-07 DIAGNOSIS — C7931 Secondary malignant neoplasm of brain: Secondary | ICD-10-CM

## 2017-02-07 DIAGNOSIS — C3491 Malignant neoplasm of unspecified part of right bronchus or lung: Secondary | ICD-10-CM

## 2017-02-07 DIAGNOSIS — C7972 Secondary malignant neoplasm of left adrenal gland: Secondary | ICD-10-CM | POA: Diagnosis not present

## 2017-02-07 DIAGNOSIS — Z5111 Encounter for antineoplastic chemotherapy: Secondary | ICD-10-CM

## 2017-02-07 DIAGNOSIS — Z923 Personal history of irradiation: Secondary | ICD-10-CM | POA: Diagnosis not present

## 2017-02-07 DIAGNOSIS — C7951 Secondary malignant neoplasm of bone: Secondary | ICD-10-CM

## 2017-02-07 DIAGNOSIS — C342 Malignant neoplasm of middle lobe, bronchus or lung: Secondary | ICD-10-CM

## 2017-02-07 DIAGNOSIS — C7932 Secondary malignant neoplasm of cerebral meninges: Secondary | ICD-10-CM | POA: Diagnosis not present

## 2017-02-07 DIAGNOSIS — Z9221 Personal history of antineoplastic chemotherapy: Secondary | ICD-10-CM | POA: Diagnosis not present

## 2017-02-07 DIAGNOSIS — C3490 Malignant neoplasm of unspecified part of unspecified bronchus or lung: Secondary | ICD-10-CM

## 2017-02-07 LAB — CBC WITH DIFFERENTIAL/PLATELET
BASOS PCT: 0 %
Basophils Absolute: 0 10*3/uL (ref 0.0–0.1)
EOS ABS: 0.1 10*3/uL (ref 0.0–0.5)
EOS PCT: 1 %
HCT: 41.4 % (ref 34.8–46.6)
Hemoglobin: 13.7 g/dL (ref 11.6–15.9)
LYMPHS ABS: 1.4 10*3/uL (ref 0.9–3.3)
Lymphocytes Relative: 20 %
MCH: 31.6 pg (ref 25.1–34.0)
MCHC: 33.1 g/dL (ref 31.5–36.0)
MCV: 95.6 fL (ref 79.5–101.0)
MONO ABS: 0.8 10*3/uL (ref 0.1–0.9)
MONOS PCT: 11 %
Neutro Abs: 4.7 10*3/uL (ref 1.5–6.5)
Neutrophils Relative %: 68 %
Platelets: 276 10*3/uL (ref 145–400)
RBC: 4.33 MIL/uL (ref 3.70–5.45)
RDW: 13.5 % (ref 11.2–16.1)
WBC: 6.9 10*3/uL (ref 3.9–10.3)

## 2017-02-07 LAB — COMPREHENSIVE METABOLIC PANEL
ALBUMIN: 3.7 g/dL (ref 3.5–5.0)
ALT: 21 U/L (ref 0–55)
ANION GAP: 9 (ref 3–11)
AST: 21 U/L (ref 5–34)
Alkaline Phosphatase: 46 U/L (ref 40–150)
BILIRUBIN TOTAL: 0.4 mg/dL (ref 0.2–1.2)
BUN: 13 mg/dL (ref 7–26)
CALCIUM: 9.2 mg/dL (ref 8.4–10.4)
CO2: 25 mmol/L (ref 22–29)
Chloride: 104 mmol/L (ref 98–109)
Creatinine, Ser: 0.69 mg/dL (ref 0.60–1.10)
GFR calc non Af Amer: >60 mL/min (ref >60)
GLUCOSE: 87 mg/dL (ref 70–140)
POTASSIUM: 4 mmol/L (ref 3.3–4.7)
SODIUM: 138 mmol/L (ref 136–145)
TOTAL PROTEIN: 6.8 g/dL (ref 6.4–8.3)

## 2017-02-07 MED ORDER — OSIMERTINIB MESYLATE 80 MG PO TABS: 80.0000 mg | ORAL_TABLET | Freq: Every day | ORAL | 2 refills | Status: DC

## 2017-02-07 NOTE — Progress Notes (Signed)
Patient walked in today and states she was told by SW-Anne to come and speak with me regarding her Medicaid.  Advised patient I was in the middle of working with another patient and would need for her to schedule an appointment so that I may a lot time to assist her. Asked patient if she received any correspondence such as letters from DSS for me to review to see if there was something I needed to send and she states she has not received anything but her Medicaid is not active yet but has a card. Advised patient I can schedule her an appointment to meet with me on 02/14/17 after her lab/injection appointment to assist her. Patient verbalized understanding and has my name and number for any additional financial questions or concerns.  Called DSS caseworker Daisy Lazar to ask if any additional information is needed regarding case.

## 2017-02-07 NOTE — Progress Notes (Signed)
Spoke with DSS caseworker and no additional information is needed. Patient was only approved for retro-active coverage which is what the card was issued for. Patient not approved for ongoing Medicaid due to being over the limit with resources/assets. Will explain again to patient when she comes next week.

## 2017-02-07 NOTE — Telephone Encounter (Signed)
Appts already scheduled per 1/24 los [facility] radiology to contact patient with ct appt

## 2017-02-07 NOTE — Progress Notes (Signed)
[place] [phone]   Fax:(336) [phone]  OFFICE PROGRESS NOTE  [name], MD [place] [address]  DIAGNOSIS: Stage IV (T2a, N1, M1b) non-small cell lung cancer, adenocarcinoma with positive EGFR mutation (deletion 60) presented with right middle lobe lung mass, right hilar adenopathy as well as metastatic disease to the bone, brain and left adrenal diagnosed in July 2016.  PRIOR THERAPY: 1) palliative radiotherapy to the brain as well as metastatic bone lesions in the pelvis. 2) Tarceva 150 mg by mouth daily started on 09/25/2014. Status post 5 months of treatment. 3) status post palliative radiotherapy to the right middle lobe lung mass for treatment of hemoptysis. 4) Tarceva 100 mg by mouth daily started 02/19/2015 status post 13 months of treatment. This was discontinued in May 2018 after the patient had disease progression and development of T790M resistant mutation.   CURRENT THERAPY: 1) Tagrisso 80 mg by mouth daily started 06/02/2016.  Status post 8 months of treatment. 2) Xgeva 120 g subcutaneously on monthly basis.  INTERVAL HISTORY: Deborah Foster 47 y.o. female returns to the clinic today for follow-up visit accompanied by her [place] interpreter.  The patient is feeling fine today with no specific complaints except for stress and intermittent headache.  She denied having any chest pain, shortness of breath but has dry cough with no hemoptysis.  She denied having any fever or chills.  She has no nausea, vomiting, diarrhea or constipation.  She has no significant weight loss or night sweats.  She is here today for evaluation and repeat blood work.   MEDICAL HISTORY: Past Medical History:  Diagnosis Date  . Bone metastasis ([place])   . Hemoptysis   . Hypokalemia   . lung ca dx'd 07/2014  . Lung mass   . Metastasis to adrenal gland ([place])   . Metastasis to brain ([place])   . Pneumonia   . Radiation  08/23/14-09/07/14   Brain/chest and left hip 30 Gy 12 Fx  . URI (upper respiratory infection) 02-02-2015    ALLERGIES:  is allergic to other and doxycycline.  MEDICATIONS:  Current Outpatient Medications  Medication Sig Dispense Refill  . AMBULATORY NON FORMULARY MEDICATION Medication Name: MAGIC MOUTHWASH 2 % Viscous Lidocaine  Maalox Benadryl Susp.  Disp: 1:1:1 28m bottle Sig: 144mPO Swish & Spit  q 3-4hrs. PRN mouth sores 200 mL 3  . benzonatate (TESSALON) 100 MG capsule Take 1 capsule (100 mg total) by mouth every 8 (eight) hours as needed. 20 capsule 0  . Calcium Carbonate-Vitamin D (CALCIUM 500 + D) 500-125 MG-UNIT TABS Take 500 mg by mouth 2 (two) times daily.    . chlorpheniramine-HYDROcodone (TUSSIONEX) 10-8 MG/5ML SUER Take 5 mLs by mouth every 12 (twelve) hours as needed. 140 mL 0  . clindamycin (CLINDAGEL) 1 % gel Apply 1 application topically 2 (two) times daily.    . Cyanocobalamin (VITAMIN B-12) 2500 MCG SUBL Place 2,500 mcg under the tongue daily.    . Marland Kitchenextromethorphan Polistirex (DELSYM PO) Take by mouth.    . ENSURE (ENSURE) Take 1 Can by mouth 2 (two) times daily between meals.     . ferrous sulfate 325 (65 FE) MG EC tablet Take 325 mg by mouth daily with breakfast.    . fluticasone (VERAMYST) 27.5 MCG/SPRAY nasal spray Place 2 sprays into the nose daily.    . folic acid (FOLVITE) 80202CG tablet Take 1 tablet (800 mcg total) by mouth daily.  30 tablet 1  . loratadine (CLARITIN) 10 MG tablet Take 1 tablet (10 mg total) by mouth daily. 30 tablet 0  . Nutritional Supplements (JUICE PLUS FIBRE PO) Take 1 capsule by mouth daily. Reported on 07/28/2015    . osimertinib mesylate (TAGRISSO) 80 MG tablet Take 1 tablet (80 mg total) by mouth daily. 30 tablet 2  . OVER THE COUNTER MEDICATION "Deep penetrating pain relief oil"    . ranitidine (ZANTAC) 300 MG tablet Take 1 tablet (300 mg total) by mouth at bedtime. 30 tablet 6  . TURMERIC PO Take 1 capsule by mouth 2 (two) times  daily. Turmeric Superior 750 mg po bid    . Turmeric POWD Take by mouth.     No current facility-administered medications for this visit.     SURGICAL HISTORY:  Past Surgical History:  Procedure Laterality Date  . IR GENERIC HISTORICAL  07/14/2015   IR RADIOLOGIST EVAL & MGMT 07/14/2015 Greggory Keen, MD [facility] INTERV RAD  . VIDEO BRONCHOSCOPY Bilateral 08/09/2014   Procedure: VIDEO BRONCHOSCOPY WITHOUT FLUORO;  Surgeon: [name], MD;  Location: [facility] ENDOSCOPY;  Service: Cardiopulmonary;  Laterality: Bilateral;  . VIDEO BRONCHOSCOPY Bilateral 07/06/2015   Procedure: VIDEO BRONCHOSCOPY WITHOUT FLUORO;  Surgeon: [name], MD;  Location: [facility] ENDOSCOPY;  Service: Cardiopulmonary;  Laterality: Bilateral;    REVIEW OF SYSTEMS:  A comprehensive review of systems was negative except for: Constitutional: positive for fatigue Respiratory: positive for cough   PHYSICAL EXAMINATION: General appearance: alert, cooperative and no distress Head: Normocephalic, without obvious abnormality, atraumatic Neck: no adenopathy, no JVD, supple, symmetrical, trachea midline and thyroid not enlarged, symmetric, no tenderness/mass/nodules Lymph nodes: Cervical, supraclavicular, and axillary nodes normal. Resp: clear to auscultation bilaterally Back: symmetric, no curvature. ROM normal. No CVA tenderness. Cardio: regular rate and rhythm, S1, S2 normal, no murmur, click, rub or gallop GI: soft, non-tender; bowel sounds normal; no masses,  no organomegaly Extremities: extremities normal, atraumatic, no cyanosis or edema  ECOG PERFORMANCE STATUS: 1 - Symptomatic but completely ambulatory  Blood pressure (!) 108/46, pulse (!) 104, temperature 98.1 F (36.7 C), temperature source Oral, resp. rate 18, height '4\' 11"'$  (1.499 m), weight 108 lb 6.4 oz (49.2 kg), SpO2 98 %.  LABORATORY DATA: Lab Results  Component Value Date   WBC 6.9 02/07/2017   HGB 13.7 02/07/2017   HCT 41.4 02/07/2017   MCV 95.6  02/07/2017   PLT 276 02/07/2017      Chemistry      Component Value Date/Time   NA 138 02/07/2017 0935   NA 140 01/17/2017 1003   K 4.0 02/07/2017 0935   K 3.9 01/17/2017 1003   CL 104 02/07/2017 0935   CO2 25 02/07/2017 0935   CO2 29 01/17/2017 1003   BUN 13 02/07/2017 0935   BUN 14.8 01/17/2017 1003   CREATININE 0.69 02/07/2017 0935   CREATININE 0.8 01/17/2017 1003      Component Value Date/Time   CALCIUM 9.2 02/07/2017 0935   CALCIUM 8.7 01/17/2017 1003   ALKPHOS 46 02/07/2017 0935   ALKPHOS 50 01/17/2017 1003   AST 21 02/07/2017 0935   AST 23 01/17/2017 1003   ALT 21 02/07/2017 0935   ALT 27 01/17/2017 1003   BILITOT 0.4 02/07/2017 0935   BILITOT 0.46 01/17/2017 1003       RADIOGRAPHIC STUDIES: No results found.  ASSESSMENT AND PLAN:  This is a very pleasant 47 years old Asian female with stage IV non-small cell lung cancer, adenocarcinoma  with positive EGFR mutation with deletion in exon 19 diagnosed in July 2016 status post treatment with Tarceva for a total of 18 months but this was discontinued secondary to disease progression and development of T790M resistant mutation. The patient was started on treatment with Tagrisso 80 mg by mouth daily status post 8 months of treatment. I recommended for the patient to continue her current treatment with Tagrisso with the same dose. I will see her back for follow-up visit in 1 month for evaluation after repeating CT scan of the chest, abdomen and pelvis for restaging of her disease. For the metastatic bone disease, she will continue her current treatment with Xgeva. The patient was advised to call immediately if she has any concerning symptoms in the interval. The patient voices understanding of current disease status and treatment options and is in agreement with the current care plan. All questions were answered. The patient knows to call the clinic with any problems, questions or concerns. We can certainly see the patient  much sooner if necessary.  Disclaimer: This note was dictated with voice recognition software. Similar sounding words can inadvertently be transcribed and may not be corrected upon review.

## 2017-02-07 NOTE — Progress Notes (Signed)
[place] CSW Progress Note  CSW received referral from Navigator to assess psychosocial issues which may be impacting patient.  Patient has already left clinic, CSW spoke w patient by phone and scheduled appointment for patient's next visit to [facility] on 1/31.  CSW will speak w patient after her meeting w financial counselor.  Encouraged patient to call w any concerns prior to scheduled appointment.  Edwyna Shell, LCSW Clinical Social Worker Phone:  [phone]

## 2017-02-13 ENCOUNTER — Other Ambulatory Visit: Payer: Self-pay | Admitting: Medical Oncology

## 2017-02-13 DIAGNOSIS — C3491 Malignant neoplasm of unspecified part of right bronchus or lung: Secondary | ICD-10-CM

## 2017-02-14 ENCOUNTER — Telehealth: Payer: Self-pay | Admitting: Internal Medicine

## 2017-02-14 ENCOUNTER — Inpatient Hospital Stay: Payer: Medicare Other

## 2017-02-14 ENCOUNTER — Encounter: Payer: Self-pay | Admitting: Internal Medicine

## 2017-02-14 ENCOUNTER — Encounter: Payer: Self-pay | Admitting: General Practice

## 2017-02-14 VITALS — BP 112/65 | HR 104 | Temp 97.6°F | Resp 18

## 2017-02-14 DIAGNOSIS — Z923 Personal history of irradiation: Secondary | ICD-10-CM | POA: Diagnosis not present

## 2017-02-14 DIAGNOSIS — C7972 Secondary malignant neoplasm of left adrenal gland: Secondary | ICD-10-CM | POA: Diagnosis not present

## 2017-02-14 DIAGNOSIS — M9080 Osteopathy in diseases classified elsewhere, unspecified site: Secondary | ICD-10-CM

## 2017-02-14 DIAGNOSIS — M898X9 Other specified disorders of bone, unspecified site: Secondary | ICD-10-CM

## 2017-02-14 DIAGNOSIS — C342 Malignant neoplasm of middle lobe, bronchus or lung: Secondary | ICD-10-CM | POA: Diagnosis not present

## 2017-02-14 DIAGNOSIS — C7931 Secondary malignant neoplasm of brain: Secondary | ICD-10-CM | POA: Diagnosis not present

## 2017-02-14 DIAGNOSIS — E889 Metabolic disorder, unspecified: Secondary | ICD-10-CM

## 2017-02-14 DIAGNOSIS — C3491 Malignant neoplasm of unspecified part of right bronchus or lung: Secondary | ICD-10-CM

## 2017-02-14 DIAGNOSIS — C3431 Malignant neoplasm of lower lobe, right bronchus or lung: Secondary | ICD-10-CM

## 2017-02-14 DIAGNOSIS — Z9221 Personal history of antineoplastic chemotherapy: Secondary | ICD-10-CM | POA: Diagnosis not present

## 2017-02-14 DIAGNOSIS — C7951 Secondary malignant neoplasm of bone: Secondary | ICD-10-CM

## 2017-02-14 LAB — CMP (CANCER CENTER ONLY)
ALT: 25 U/L (ref 0–55)
ANION GAP: 7 (ref 3–11)
AST: 21 U/L (ref 5–34)
Albumin: 3.9 g/dL (ref 3.5–5.0)
Alkaline Phosphatase: 54 U/L (ref 40–150)
BILIRUBIN TOTAL: 0.3 mg/dL (ref 0.2–1.2)
BUN: 10 mg/dL (ref 7–26)
CHLORIDE: 103 mmol/L (ref 98–109)
CO2: 28 mmol/L (ref 22–29)
Calcium: 9.2 mg/dL (ref 8.4–10.4)
Creatinine: 0.71 mg/dL (ref 0.60–1.10)
Glucose, Bld: 92 mg/dL (ref 70–140)
POTASSIUM: 4.2 mmol/L (ref 3.5–5.1)
Sodium: 138 mmol/L (ref 136–145)
TOTAL PROTEIN: 7.1 g/dL (ref 6.4–8.3)

## 2017-02-14 LAB — CBC WITH DIFFERENTIAL (CANCER CENTER ONLY)
Basophils Absolute: 0 10*3/uL (ref 0.0–0.1)
Basophils Relative: 0 %
EOS PCT: 1 %
Eosinophils Absolute: 0.1 10*3/uL (ref 0.0–0.5)
HEMATOCRIT: 42.9 % (ref 34.8–46.6)
HEMOGLOBIN: 14 g/dL (ref 11.6–15.9)
LYMPHS ABS: 1.2 10*3/uL (ref 0.9–3.3)
LYMPHS PCT: 19 %
MCH: 31.2 pg (ref 25.1–34.0)
MCHC: 32.6 g/dL (ref 31.5–36.0)
MCV: 95.5 fL (ref 79.5–101.0)
Monocytes Absolute: 0.7 10*3/uL (ref 0.1–0.9)
Monocytes Relative: 11 %
NEUTROS ABS: 4.3 10*3/uL (ref 1.5–6.5)
NEUTROS PCT: 69 %
PLATELETS: 283 10*3/uL (ref 145–400)
RBC: 4.49 MIL/uL (ref 3.70–5.45)
RDW: 13.7 % (ref 11.2–14.5)
WBC: 6.2 10*3/uL (ref 3.9–10.3)

## 2017-02-14 MED ORDER — DENOSUMAB 120 MG/1.7ML ~~LOC~~ SOLN
120.0000 mg | Freq: Once | SUBCUTANEOUS | Status: AC
  Administered 2017-02-14: 120 mg via SUBCUTANEOUS

## 2017-02-14 NOTE — Progress Notes (Signed)
[place] CSW Progress Note  Received referral from MD and nurse navigator to assess home environment and social support.  CSW met w patient and interpreter Raynaldo Opitz to assess current psychosocial needs.  Patient's main concern was financial  Patient had just met w Financial Advocate to discuss unpaid balance and insurance issues.  Patient wanted help w Medicaid application.  Per Estate manager/land agent, patient is not eligible for Medicaid at this time.  Current coverage is Medicare, patient also showed CSW information on [facility] Medicare prescription drug plan that will begin on 03/15/17 as well as letter from [facility] explaining interim pharmacy benefit LINET which can provide drug coverage during the transition period of 1/1 - 03/15/17.  Patient appears to understand, interpreter has also spoken w patient about Medicare supplemental coverage.  Discussed home environment.  Patient aware of significant issues; however does not want to make any changes at this time.  Was given resource of [employer] to explore options for legal and psychosocial support in the community.  Encouraged patient to keep in touch w CSW, will also touch base w patient on next appt 2/28.   Treatment team updated via staff message.  Edwyna Shell, LCSW Clinical Social Worker Phone:  [phone]

## 2017-02-14 NOTE — Progress Notes (Signed)
Emailed information to [employer] in billing to submit past dates of service to submit to Medicaid. Received email back from her that these have been sent.  Met with patient and interpreter for an hour sorting bills and going over patient responsibility.  Called billing to provide Medicaid ID and dos for Aug 2018 which was covered under her retro-active Medicaid period 07/15/16-10/14/16.  Also called [facility] and [facility] to provide the number as well. Wrote down the names and date of whom I spoke with one each bill for her and what they were going to bill to Medicaid.   Patient has my name and number for any additional financial questions or concerns.

## 2017-02-14 NOTE — Telephone Encounter (Signed)
Patient called about MD appointment which Kenney Houseman is working on

## 2017-02-20 ENCOUNTER — Other Ambulatory Visit: Payer: Self-pay | Admitting: Radiation Therapy

## 2017-02-20 ENCOUNTER — Encounter: Payer: Self-pay | Admitting: Pharmacy Technician

## 2017-02-20 DIAGNOSIS — C7949 Secondary malignant neoplasm of other parts of nervous system: Principal | ICD-10-CM

## 2017-02-20 DIAGNOSIS — C7931 Secondary malignant neoplasm of brain: Secondary | ICD-10-CM

## 2017-02-20 NOTE — Progress Notes (Signed)
Drug Assistance for Deborah Foster is closed since the patient is now covered by Medicare effective 01/15/17.

## 2017-02-21 ENCOUNTER — Other Ambulatory Visit: Payer: Self-pay | Admitting: Medical Oncology

## 2017-02-21 DIAGNOSIS — C3491 Malignant neoplasm of unspecified part of right bronchus or lung: Secondary | ICD-10-CM

## 2017-02-21 DIAGNOSIS — Z5111 Encounter for antineoplastic chemotherapy: Secondary | ICD-10-CM

## 2017-02-21 DIAGNOSIS — C7931 Secondary malignant neoplasm of brain: Secondary | ICD-10-CM

## 2017-02-21 DIAGNOSIS — C7972 Secondary malignant neoplasm of left adrenal gland: Secondary | ICD-10-CM

## 2017-02-21 DIAGNOSIS — C7951 Secondary malignant neoplasm of bone: Secondary | ICD-10-CM

## 2017-02-21 MED ORDER — OSIMERTINIB MESYLATE 80 MG PO TABS: 80.0000 mg | ORAL_TABLET | Freq: Every day | ORAL | 2 refills | Status: DC

## 2017-02-22 ENCOUNTER — Telehealth: Payer: Self-pay | Admitting: Pharmacist

## 2017-02-22 NOTE — Telephone Encounter (Signed)
Oral Oncology Pharmacist Encounter  Received call from [employer] patient assistance program that is providing Tagrisso free to patient. Patient with 8 days of medication left and had called [employer] to order next fill.  Per [employer] they do not have remaining refills on file for patient.  I inquired about Rx's faxed to their program on 02/07/17 and 02/21/17, bot for 30 day supply with 2 refills.  After extensive hold time I was informed they were able to locate Rx faxed on 02/21/17 and would process it even though it was faxed without a cover sheet because the prescription does say that it originated at the [facility].  Representative also questioned why fax # on Rx was different than fax on application. Representative also stated that Dr. Worthy Flank DEA was expired, then returned back on line to correct that statement.  Representative was able to process prescription at that point and send it to pharmacist at their patient assistance pharmacy for verification.  Patient should receive next fill of Tagrisso in 3-5 business days.  Oral Oncology Clinic will continue to follow.  Johny Drilling, PharmD, BCPS, BCOP 02/22/2017 12:35 PM Oral Oncology Clinic [phone]

## 2017-02-27 NOTE — Telephone Encounter (Signed)
Oral Oncology Patient Advocate Encounter  Patient is enrolled in the [employer] Patient Assistance Program until 07/30/2017.   Deborah Foster. Deborah Foster, Deborah Foster [phone] 02/27/2017 4:41 PM

## 2017-03-08 ENCOUNTER — Ambulatory Visit (HOSPITAL_COMMUNITY)
Admission: RE | Admit: 2017-03-08 | Discharge: 2017-03-08 | Disposition: A | Discharge status: Home / Self Care | Payer: Medicare Other | Source: Ambulatory Visit | Attending: Internal Medicine | Admitting: Internal Medicine

## 2017-03-08 DIAGNOSIS — C7951 Secondary malignant neoplasm of bone: Secondary | ICD-10-CM | POA: Insufficient documentation

## 2017-03-08 DIAGNOSIS — C3490 Malignant neoplasm of unspecified part of unspecified bronchus or lung: Secondary | ICD-10-CM | POA: Diagnosis not present

## 2017-03-08 DIAGNOSIS — E279 Disorder of adrenal gland, unspecified: Secondary | ICD-10-CM | POA: Diagnosis not present

## 2017-03-08 MED ORDER — IOPAMIDOL (ISOVUE-300) INJECTION 61%
INTRAVENOUS | Status: AC
  Filled 2017-03-08: qty 100

## 2017-03-08 MED ORDER — SODIUM CHLORIDE 0.9 % IJ SOLN
INTRAMUSCULAR | Status: AC
  Filled 2017-03-08: qty 50

## 2017-03-08 MED ORDER — IOPAMIDOL (ISOVUE-300) INJECTION 61%
100.0000 mL | Freq: Once | INTRAVENOUS | Status: AC | PRN
  Administered 2017-03-08: 100 mL via INTRAVENOUS

## 2017-03-11 ENCOUNTER — Telehealth: Payer: Self-pay

## 2017-03-11 ENCOUNTER — Inpatient Hospital Stay: Payer: Medicare Other | Attending: Internal Medicine | Admitting: Internal Medicine

## 2017-03-11 ENCOUNTER — Ambulatory Visit: Payer: Self-pay | Admitting: Oncology

## 2017-03-11 ENCOUNTER — Encounter: Payer: Self-pay | Admitting: Internal Medicine

## 2017-03-11 ENCOUNTER — Inpatient Hospital Stay: Payer: Medicare Other

## 2017-03-11 ENCOUNTER — Other Ambulatory Visit: Payer: Self-pay

## 2017-03-11 ENCOUNTER — Encounter: Payer: Self-pay | Admitting: *Deleted

## 2017-03-11 VITALS — BP 110/66 | HR 106 | Temp 98.1°F | Resp 18 | Ht 59.0 in | Wt 110.0 lb

## 2017-03-11 DIAGNOSIS — Z923 Personal history of irradiation: Secondary | ICD-10-CM | POA: Diagnosis not present

## 2017-03-11 DIAGNOSIS — Z79899 Other long term (current) drug therapy: Secondary | ICD-10-CM | POA: Diagnosis not present

## 2017-03-11 DIAGNOSIS — C7951 Secondary malignant neoplasm of bone: Secondary | ICD-10-CM | POA: Insufficient documentation

## 2017-03-11 DIAGNOSIS — C3491 Malignant neoplasm of unspecified part of right bronchus or lung: Secondary | ICD-10-CM

## 2017-03-11 DIAGNOSIS — C7972 Secondary malignant neoplasm of left adrenal gland: Secondary | ICD-10-CM | POA: Diagnosis not present

## 2017-03-11 DIAGNOSIS — C7932 Secondary malignant neoplasm of cerebral meninges: Secondary | ICD-10-CM | POA: Insufficient documentation

## 2017-03-11 DIAGNOSIS — C342 Malignant neoplasm of middle lobe, bronchus or lung: Secondary | ICD-10-CM | POA: Diagnosis not present

## 2017-03-11 DIAGNOSIS — Z9221 Personal history of antineoplastic chemotherapy: Secondary | ICD-10-CM | POA: Insufficient documentation

## 2017-03-11 DIAGNOSIS — C3490 Malignant neoplasm of unspecified part of unspecified bronchus or lung: Secondary | ICD-10-CM

## 2017-03-11 LAB — CMP (CANCER CENTER ONLY)
ALT: 21 U/L (ref 0–55)
ANION GAP: 7 (ref 3–11)
AST: 20 U/L (ref 5–34)
Albumin: 3.6 g/dL (ref 3.5–5.0)
Alkaline Phosphatase: 45 U/L (ref 40–150)
BUN: 17 mg/dL (ref 7–26)
CHLORIDE: 104 mmol/L (ref 98–109)
CO2: 27 mmol/L (ref 22–29)
CREATININE: 0.69 mg/dL (ref 0.60–1.10)
Calcium: 9 mg/dL (ref 8.4–10.4)
Glucose, Bld: 94 mg/dL (ref 70–140)
POTASSIUM: 4.2 mmol/L (ref 3.5–5.1)
Sodium: 138 mmol/L (ref 136–145)
Total Bilirubin: 0.4 mg/dL (ref 0.2–1.2)
Total Protein: 6.8 g/dL (ref 6.4–8.3)

## 2017-03-11 LAB — CBC WITH DIFFERENTIAL (CANCER CENTER ONLY)
Basophils Absolute: 0 10*3/uL (ref 0.0–0.1)
Basophils Relative: 0 %
Eosinophils Absolute: 0.1 10*3/uL (ref 0.0–0.5)
Eosinophils Relative: 1 %
HCT: 41.8 % (ref 34.8–46.6)
HEMOGLOBIN: 13.7 g/dL (ref 11.6–15.9)
LYMPHS ABS: 1.3 10*3/uL (ref 0.9–3.3)
LYMPHS PCT: 18 %
MCH: 31.7 pg (ref 25.1–34.0)
MCHC: 32.8 g/dL (ref 31.5–36.0)
MCV: 96.8 fL (ref 79.5–101.0)
Monocytes Absolute: 0.6 10*3/uL (ref 0.1–0.9)
Monocytes Relative: 9 %
NEUTROS PCT: 72 %
Neutro Abs: 5.2 10*3/uL (ref 1.5–6.5)
Platelet Count: 270 10*3/uL (ref 145–400)
RBC: 4.32 MIL/uL (ref 3.70–5.45)
RDW: 13.3 % (ref 11.2–14.5)
WBC Count: 7.1 10*3/uL (ref 3.9–10.3)

## 2017-03-11 NOTE — Progress Notes (Signed)
[facility] [phone]   [phone]  OFFICE PROGRESS NOTE  [name], MD [place] [address]  DIAGNOSIS: Stage IV (T2a, N1, M1b) non-small cell lung cancer, adenocarcinoma with positive EGFR mutation (deletion 76) presented with right middle lobe lung mass, right hilar adenopathy as well as metastatic disease to the bone, brain and left adrenal diagnosed in July 2016.  PRIOR THERAPY: 1) palliative radiotherapy to the brain as well as metastatic bone lesions in the pelvis. 2) Tarceva 150 mg by mouth daily started on 09/25/2014. Status post 5 months of treatment. 3) status post palliative radiotherapy to the right middle lobe lung mass for treatment of hemoptysis. 4) Tarceva 100 mg by mouth daily started 02/19/2015 status post 13 months of treatment. This was discontinued in May 2018 after the patient had disease progression and development of T790M resistant mutation.   CURRENT THERAPY: 1) Tagrisso 80 mg by mouth daily started 06/02/2016.  Status post 9 months of treatment. 2) Xgeva 120 g subcutaneously on monthly basis.  INTERVAL HISTORY: Deborah Foster 47 y.o. female returns to the clinic today for follow-up visit accompanied by her [place] interpreter.  The patient is feeling fine today with no specific complaints except for mild pain in the left flank area.  She denied having any chest pain, shortness breath, cough or hemoptysis.  She denied having any recent weight loss or night sweats.  She has no nausea, vomiting, diarrhea or constipation.  She denied having any significant skin rash.  She is tolerating her treatment with Tagrisso fairly well.  The patient had a repeat CT scan of the chest, abdomen and pelvis performed recently and she is here for evaluation and discussion of her scan results.   MEDICAL HISTORY: Past Medical History:  Diagnosis Date  . Bone metastasis ([place])   . Hemoptysis   . Hypokalemia    . lung ca dx'd 07/2014  . Lung mass   . Metastasis to adrenal gland ([place])   . Metastasis to brain ([place])   . Pneumonia   . Radiation 08/23/14-09/07/14   Brain/chest and left hip 30 Gy 12 Fx  . URI (upper respiratory infection) 02-02-2015    ALLERGIES:  is allergic to other and doxycycline.  MEDICATIONS:  Current Outpatient Medications  Medication Sig Dispense Refill  . AMBULATORY NON FORMULARY MEDICATION Medication Name: MAGIC MOUTHWASH 2 % Viscous Lidocaine  Maalox Benadryl Susp.  Disp: 1:1:1 236m bottle Sig: 117mPO Swish & Spit  q 3-4hrs. PRN mouth sores 200 mL 3  . benzonatate (TESSALON) 100 MG capsule Take 1 capsule (100 mg total) by mouth every 8 (eight) hours as needed. 20 capsule 0  . Calcium Carbonate-Vitamin D (CALCIUM 500 + D) 500-125 MG-UNIT TABS Take 500 mg by mouth 2 (two) times daily.    . chlorpheniramine-HYDROcodone (TUSSIONEX) 10-8 MG/5ML SUER Take 5 mLs by mouth every 12 (twelve) hours as needed. 140 mL 0  . clindamycin (CLINDAGEL) 1 % gel Apply 1 application topically 2 (two) times daily.    . Cyanocobalamin (VITAMIN B-12) 2500 MCG SUBL Place 2,500 mcg under the tongue daily.    . Marland Kitchenextromethorphan Polistirex (DELSYM PO) Take by mouth.    . ENSURE (ENSURE) Take 1 Can by mouth 2 (two) times daily between meals.     . ferrous sulfate 325 (65 FE) MG EC tablet Take 325 mg by mouth daily with breakfast.    . fluticasone (VERAMYST) 27.5 MCG/SPRAY nasal spray  Place 2 sprays into the nose daily.    . folic acid (FOLVITE) 161 MCG tablet Take 1 tablet (800 mcg total) by mouth daily. 30 tablet 1  . loratadine (CLARITIN) 10 MG tablet Take 1 tablet (10 mg total) by mouth daily. 30 tablet 0  . Nutritional Supplements (JUICE PLUS FIBRE PO) Take 1 capsule by mouth daily. Reported on 07/28/2015    . osimertinib mesylate (TAGRISSO) 80 MG tablet Take 1 tablet (80 mg total) by mouth daily. 30 tablet 2  . OVER THE COUNTER MEDICATION "Deep penetrating pain relief oil"    . ranitidine  (ZANTAC) 300 MG tablet Take 1 tablet (300 mg total) by mouth at bedtime. 30 tablet 6  . TURMERIC PO Take 1 capsule by mouth 2 (two) times daily. Turmeric Superior 750 mg po bid    . Turmeric POWD Take by mouth.     No current facility-administered medications for this visit.     SURGICAL HISTORY:  Past Surgical History:  Procedure Laterality Date  . IR GENERIC HISTORICAL  07/14/2015   IR RADIOLOGIST EVAL & MGMT 07/14/2015 Greggory Keen, MD [facility] INTERV RAD  . VIDEO BRONCHOSCOPY Bilateral 08/09/2014   Procedure: VIDEO BRONCHOSCOPY WITHOUT FLUORO;  Surgeon: [name], MD;  Location: [facility] ENDOSCOPY;  Service: Cardiopulmonary;  Laterality: Bilateral;  . VIDEO BRONCHOSCOPY Bilateral 07/06/2015   Procedure: VIDEO BRONCHOSCOPY WITHOUT FLUORO;  Surgeon: [name], MD;  Location: [facility] ENDOSCOPY;  Service: Cardiopulmonary;  Laterality: Bilateral;    REVIEW OF SYSTEMS:  Constitutional: negative Eyes: negative Ears, nose, mouth, throat, and face: negative Respiratory: negative Cardiovascular: negative Gastrointestinal: positive for abdominal pain Genitourinary:negative Integument/breast: negative Hematologic/lymphatic: negative Musculoskeletal:negative Neurological: negative Behavioral/Psych: negative Endocrine: negative Allergic/Immunologic: negative   PHYSICAL EXAMINATION: General appearance: alert, cooperative and no distress Head: Normocephalic, without obvious abnormality, atraumatic Neck: no adenopathy, no JVD, supple, symmetrical, trachea midline and thyroid not enlarged, symmetric, no tenderness/mass/nodules Lymph nodes: Cervical, supraclavicular, and axillary nodes normal. Resp: clear to auscultation bilaterally Back: symmetric, no curvature. ROM normal. No CVA tenderness. Cardio: regular rate and rhythm, S1, S2 normal, no murmur, click, rub or gallop GI: soft, non-tender; bowel sounds normal; no masses,  no organomegaly Extremities: extremities normal, atraumatic, no  cyanosis or edema Neurologic: Alert and oriented X 3, normal strength and tone. Normal symmetric reflexes. Normal coordination and gait  ECOG PERFORMANCE STATUS: 1 - Symptomatic but completely ambulatory  Blood pressure 110/66, pulse (!) 106, temperature 98.1 F (36.7 C), temperature source Oral, resp. rate 18, height _0  (1.499 m), weight 110 lb (49.9 kg), SpO2 98 %.  LABORATORY DATA: Lab Results  Component Value Date   WBC 7.1 03/11/2017   HGB 13.7 02/07/2017   HCT 41.8 03/11/2017   MCV 96.8 03/11/2017   PLT 270 03/11/2017      Chemistry      Component Value Date/Time   NA 138 03/11/2017 0942   NA 140 01/17/2017 1003   K 4.2 03/11/2017 0942   K 3.9 01/17/2017 1003   CL 104 03/11/2017 0942   CO2 27 03/11/2017 0942   CO2 29 01/17/2017 1003   BUN 17 03/11/2017 0942   BUN 14.8 01/17/2017 1003   CREATININE 0.69 03/11/2017 0942   CREATININE 0.8 01/17/2017 1003      Component Value Date/Time   CALCIUM 9.0 03/11/2017 0942   CALCIUM 8.7 01/17/2017 1003   ALKPHOS 45 03/11/2017 0942   ALKPHOS 50 01/17/2017 1003   AST 20 03/11/2017 0942   AST 23 01/17/2017 1003  ALT 21 03/11/2017 0942   ALT 27 01/17/2017 1003   BILITOT 0.4 03/11/2017 0942   BILITOT 0.46 01/17/2017 1003       RADIOGRAPHIC STUDIES: Ct Chest W Contrast  Result Date: 03/08/2017 CLINICAL DATA:  Restaging lung cancer. EXAM: CT CHEST, ABDOMEN, AND PELVIS WITH CONTRAST TECHNIQUE: Multidetector CT imaging of the chest, abdomen and pelvis was performed following the standard protocol during bolus administration of intravenous contrast. CONTRAST:  17m ISOVUE-300 IOPAMIDOL (ISOVUE-300) INJECTION 61% COMPARISON:  12/07/2016 FINDINGS: CT CHEST FINDINGS Cardiovascular: The heart is normal in size. No pericardial effusion. The aorta is normal in caliber. No atherosclerotic calcifications or dissection. No coronary artery calcifications. Stable enlarged pulmonary artery suggesting pulmonary hypertension.  Mediastinum/Nodes: No mediastinal or hilar mass or lymphadenopathy. Small scattered lymph nodes are stable. The esophagus is grossly normal. Lungs/Pleura: Stable radiation changes involving the right paramediastinal lung. No findings to suggest recurrent tumor. No findings suspicious for pulmonary metastatic disease. Few tiny scattered sub 3 mm pulmonary nodules are stable. No pleural effusion. Musculoskeletal: Stable sclerotic bone lesions involving the sternum, right clavicle, ribs and spine. CT ABDOMEN PELVIS FINDINGS Hepatobiliary: No focal hepatic lesions to suggest metastatic disease. Mild stable intrahepatic and extrahepatic biliary dilatation. The gallbladder appears normal. Pancreas: No mass, inflammation or ductal dilatation. Spleen: Normal size.  No focal lesions. Adrenals/Urinary Tract: Enlarging left adrenal gland lesion consistent with a metastatic focus. It previously measured 12.0 x 10.5 mm and now measures 19 x 18 mm. No right-sided adrenal gland lesion. The kidneys and bladder are normal. Stomach/Bowel: The stomach, duodenum, small bowel and colon are unremarkable. No acute inflammatory changes, mass lesions or obstructive findings. The terminal ileum is normal. The appendix is normal. Vascular/Lymphatic: The aorta is normal in caliber. No dissection. The branch vessels are patent. The major venous structures are patent. No mesenteric or retroperitoneal mass or adenopathy. Small scattered lymph nodes are noted. Reproductive: The uterus and ovaries are unremarkable. Other: No pelvic adenopathy or free pelvic fluid collections. No inguinal adenopathy. No subcutaneous lesions. Musculoskeletal: Stable largely sclerotic bone lesions involving the pelvis and hips. IMPRESSION: 1. Stable radiation changes involving the right paramediastinal long without findings suspicious for recurrent tumor. No mediastinal or hilar mass or adenopathy. 2. No findings for pulmonary metastatic disease. 3. Enlarging left  adrenal gland lesion consistent with metastasis. 4. No new metastatic disease involving the abdomen/pelvis. 5. Stable osseous metastatic disease. No new or progressive findings. Electronically Signed   By: PMarijo SanesM.D.   On: 03/08/2017 08:38   Ct Abdomen Pelvis W Contrast  Result Date: 03/08/2017 CLINICAL DATA:  Restaging lung cancer. EXAM: CT CHEST, ABDOMEN, AND PELVIS WITH CONTRAST TECHNIQUE: Multidetector CT imaging of the chest, abdomen and pelvis was performed following the standard protocol during bolus administration of intravenous contrast. CONTRAST:  1019mISOVUE-300 IOPAMIDOL (ISOVUE-300) INJECTION 61% COMPARISON:  12/07/2016 FINDINGS: CT CHEST FINDINGS Cardiovascular: The heart is normal in size. No pericardial effusion. The aorta is normal in caliber. No atherosclerotic calcifications or dissection. No coronary artery calcifications. Stable enlarged pulmonary artery suggesting pulmonary hypertension. Mediastinum/Nodes: No mediastinal or hilar mass or lymphadenopathy. Small scattered lymph nodes are stable. The esophagus is grossly normal. Lungs/Pleura: Stable radiation changes involving the right paramediastinal lung. No findings to suggest recurrent tumor. No findings suspicious for pulmonary metastatic disease. Few tiny scattered sub 3 mm pulmonary nodules are stable. No pleural effusion. Musculoskeletal: Stable sclerotic bone lesions involving the sternum, right clavicle, ribs and spine. CT ABDOMEN PELVIS FINDINGS Hepatobiliary: No focal hepatic lesions  to suggest metastatic disease. Mild stable intrahepatic and extrahepatic biliary dilatation. The gallbladder appears normal. Pancreas: No mass, inflammation or ductal dilatation. Spleen: Normal size.  No focal lesions. Adrenals/Urinary Tract: Enlarging left adrenal gland lesion consistent with a metastatic focus. It previously measured 12.0 x 10.5 mm and now measures 19 x 18 mm. No right-sided adrenal gland lesion. The kidneys and bladder are  normal. Stomach/Bowel: The stomach, duodenum, small bowel and colon are unremarkable. No acute inflammatory changes, mass lesions or obstructive findings. The terminal ileum is normal. The appendix is normal. Vascular/Lymphatic: The aorta is normal in caliber. No dissection. The branch vessels are patent. The major venous structures are patent. No mesenteric or retroperitoneal mass or adenopathy. Small scattered lymph nodes are noted. Reproductive: The uterus and ovaries are unremarkable. Other: No pelvic adenopathy or free pelvic fluid collections. No inguinal adenopathy. No subcutaneous lesions. Musculoskeletal: Stable largely sclerotic bone lesions involving the pelvis and hips. IMPRESSION: 1. Stable radiation changes involving the right paramediastinal long without findings suspicious for recurrent tumor. No mediastinal or hilar mass or adenopathy. 2. No findings for pulmonary metastatic disease. 3. Enlarging left adrenal gland lesion consistent with metastasis. 4. No new metastatic disease involving the abdomen/pelvis. 5. Stable osseous metastatic disease. No new or progressive findings. Electronically Signed   By: Marijo Sanes M.D.   On: 03/08/2017 08:38    ASSESSMENT AND PLAN:  This is a very pleasant 47 years old Asian female with stage IV non-small cell lung cancer, adenocarcinoma with positive EGFR mutation with deletion in exon 21 diagnosed in July 2016 status post treatment with Tarceva for a total of 18 months but this was discontinued secondary to disease progression and development of T790M resistant mutation. The patient was started on treatment with Tagrisso 80 mg by mouth daily status post 9 months of treatment. The patient continues to tolerate this treatment fairly well with no concerning complaints.  She had repeat CT scan of the chest, abdomen and pelvis performed recently.  I personally and independently reviewed the scan images and discussed the results with the patient today.  Her  scan showed no concerning findings for disease progression except for enlarging left adrenal gland lesion consistent with metastasis. I recommended for the patient to continue her current treatment with Tagrisso with the same dose. For the enlarging solitary left adrenal gland lesion, I would refer the patient to Dr. Lisbeth Renshaw for consideration of palliative radiotherapy to this lesion. I would see the patient back for follow-up visit in 1 month for evaluation with repeat blood work. For the metastatic bone disease, she will continue her current treatment with Xgeva. The patient was advised to call immediately if she has any concerning symptoms in the interval. The patient voices understanding of current disease status and treatment options and is in agreement with the current care plan. All questions were answered. The patient knows to call the clinic with any problems, questions or concerns. We can certainly see the patient much sooner if necessary.  Disclaimer: This note was dictated with voice recognition software. Similar sounding words can inadvertently be transcribed and may not be corrected upon review.

## 2017-03-11 NOTE — Progress Notes (Signed)
Oncology Nurse Navigator Documentation  Oncology Nurse Navigator Flowsheets 03/11/2017  Navigator Location [facility]  Navigator Encounter Type Clinic/MDC/I spoke with patient at clinic today. She was accompanied by an interpretor.  She has some disease progression in the adrenal glad.  She is being referred back to Rad Onc for consult. I will update Rad Onc scheduling team.   Patient also is scheduled for MRI brain at [facility].  Patient states insurance will cover her MRI at [facility] but not the [facility].  I updated Rad Onc brain navigator to help re-schedule MRI   Patient Visit Type MedOnc  Treatment Phase Treatment  Barriers/Navigation Needs Coordination of Care;Education  Interventions Coordination of Care  Coordination of Care Other  Acuity Level 2  Time Spent with Patient 30

## 2017-03-11 NOTE — Telephone Encounter (Signed)
Printed avs and calender of upcoming appointment. Per 2/25 los 

## 2017-03-12 ENCOUNTER — Encounter: Payer: Self-pay | Admitting: Radiation Oncology

## 2017-03-14 ENCOUNTER — Other Ambulatory Visit: Payer: Self-pay

## 2017-03-14 ENCOUNTER — Inpatient Hospital Stay: Payer: Medicare Other

## 2017-03-14 DIAGNOSIS — C342 Malignant neoplasm of middle lobe, bronchus or lung: Secondary | ICD-10-CM | POA: Diagnosis not present

## 2017-03-14 DIAGNOSIS — Z9221 Personal history of antineoplastic chemotherapy: Secondary | ICD-10-CM | POA: Diagnosis not present

## 2017-03-14 DIAGNOSIS — C7972 Secondary malignant neoplasm of left adrenal gland: Secondary | ICD-10-CM | POA: Diagnosis not present

## 2017-03-14 DIAGNOSIS — C3491 Malignant neoplasm of unspecified part of right bronchus or lung: Secondary | ICD-10-CM

## 2017-03-14 DIAGNOSIS — M9080 Osteopathy in diseases classified elsewhere, unspecified site: Secondary | ICD-10-CM

## 2017-03-14 DIAGNOSIS — E889 Metabolic disorder, unspecified: Secondary | ICD-10-CM

## 2017-03-14 DIAGNOSIS — C7951 Secondary malignant neoplasm of bone: Secondary | ICD-10-CM | POA: Diagnosis not present

## 2017-03-14 DIAGNOSIS — C3431 Malignant neoplasm of lower lobe, right bronchus or lung: Secondary | ICD-10-CM

## 2017-03-14 DIAGNOSIS — M898X9 Other specified disorders of bone, unspecified site: Secondary | ICD-10-CM

## 2017-03-14 DIAGNOSIS — Z923 Personal history of irradiation: Secondary | ICD-10-CM | POA: Diagnosis not present

## 2017-03-14 DIAGNOSIS — C7932 Secondary malignant neoplasm of cerebral meninges: Secondary | ICD-10-CM | POA: Diagnosis not present

## 2017-03-14 MED ORDER — DENOSUMAB 120 MG/1.7ML ~~LOC~~ SOLN
120.0000 mg | Freq: Once | SUBCUTANEOUS | Status: AC
  Administered 2017-03-14: 120 mg via SUBCUTANEOUS

## 2017-03-14 NOTE — Patient Instructions (Signed)
Denosumab injection  What is this medicine?  DENOSUMAB (den oh sue mab) slows bone breakdown. Prolia is used to treat osteoporosis in women after menopause and in men. Xgeva is used to prevent bone fractures and other bone problems caused by cancer bone metastases. Xgeva is also used to treat giant cell tumor of the bone.  This medicine may be used for other purposes; ask your health care provider or pharmacist if you have questions.  What should I tell my health care provider before I take this medicine?  They need to know if you have any of these conditions:  -dental disease  -eczema  -infection or history of infections  -kidney disease or on dialysis  -low blood calcium or vitamin D  -malabsorption syndrome  -scheduled to have surgery or tooth extraction  -taking medicine that contains denosumab  -thyroid or parathyroid disease  -an unusual reaction to denosumab, other medicines, foods, dyes, or preservatives  -pregnant or trying to get pregnant  -breast-feeding  How should I use this medicine?  This medicine is for injection under the skin. It is given by a health care professional in a hospital or clinic setting.  If you are getting Prolia, a special MedGuide will be given to you by the pharmacist with each prescription and refill. Be sure to read this information carefully each time.  For Prolia, talk to your pediatrician regarding the use of this medicine in children. Special care may be needed. For Xgeva, talk to your pediatrician regarding the use of this medicine in children. While this drug may be prescribed for children as young as 13 years for selected conditions, precautions do apply.  Overdosage: If you think you have taken too much of this medicine contact a poison control center or emergency room at once.  NOTE: This medicine is only for you. Do not share this medicine with others.  What if I miss a dose?  It is important not to miss your dose. Call your doctor or health care professional if you are  unable to keep an appointment.  What may interact with this medicine?  Do not take this medicine with any of the following medications:  -other medicines containing denosumab  This medicine may also interact with the following medications:  -medicines that suppress the immune system  -medicines that treat cancer  -steroid medicines like prednisone or cortisone  This list may not describe all possible interactions. Give your health care provider a list of all the medicines, herbs, non-prescription drugs, or dietary supplements you use. Also tell them if you smoke, drink alcohol, or use illegal drugs. Some items may interact with your medicine.  What should I watch for while using this medicine?  Visit your doctor or health care professional for regular checks on your progress. Your doctor or health care professional may order blood tests and other tests to see how you are doing.  Call your doctor or health care professional if you get a cold or other infection while receiving this medicine. Do not treat yourself. This medicine may decrease your body's ability to fight infection.  You should make sure you get enough calcium and vitamin D while you are taking this medicine, unless your doctor tells you not to. Discuss the foods you eat and the vitamins you take with your health care professional.  See your dentist regularly. Brush and floss your teeth as directed. Before you have any dental work done, tell your dentist you are receiving this medicine.  Do   not become pregnant while taking this medicine or for 5 months after stopping it. Women should inform their doctor if they wish to become pregnant or think they might be pregnant. There is a potential for serious side effects to an unborn child. Talk to your health care professional or pharmacist for more information.  What side effects may I notice from receiving this medicine?  Side effects that you should report to your doctor or health care professional as soon as  possible:  -allergic reactions like skin rash, itching or hives, swelling of the face, lips, or tongue  -breathing problems  -chest pain  -fast, irregular heartbeat  -feeling faint or lightheaded, falls  -fever, chills, or any other sign of infection  -muscle spasms, tightening, or twitches  -numbness or tingling  -skin blisters or bumps, or is dry, peels, or red  -slow healing or unexplained pain in the mouth or jaw  -unusual bleeding or bruising  Side effects that usually do not require medical attention (Report these to your doctor or health care professional if they continue or are bothersome.):  -muscle pain  -stomach upset, gas  This list may not describe all possible side effects. Call your doctor for medical advice about side effects. You may report side effects to FDA at 1-800-FDA-1088.  Where should I keep my medicine?  This medicine is only given in a clinic, doctor's office, or other health care setting and will not be stored at home.  NOTE: This sheet is a summary. It may not cover all possible information. If you have questions about this medicine, talk to your doctor, pharmacist, or health care provider.      2016, Elsevier/Gold Standard. (2011-07-02 12:37:47)

## 2017-03-15 ENCOUNTER — Ambulatory Visit
Admission: RE | Admit: 2017-03-15 | Discharge: 2017-03-15 | Disposition: A | Discharge status: Home / Self Care | Payer: Medicare Other | Source: Ambulatory Visit | Attending: Radiation Oncology | Admitting: Radiation Oncology

## 2017-03-15 ENCOUNTER — Other Ambulatory Visit: Payer: Self-pay

## 2017-03-15 DIAGNOSIS — C7949 Secondary malignant neoplasm of other parts of nervous system: Principal | ICD-10-CM

## 2017-03-15 DIAGNOSIS — C7931 Secondary malignant neoplasm of brain: Secondary | ICD-10-CM

## 2017-03-15 MED ORDER — GADOBENATE DIMEGLUMINE 529 MG/ML IV SOLN
12.0000 mL | Freq: Once | INTRAVENOUS | Status: AC | PRN
  Administered 2017-03-15: 12 mL via INTRAVENOUS

## 2017-03-18 ENCOUNTER — Ambulatory Visit: Payer: Self-pay | Admitting: Radiation Oncology

## 2017-03-18 NOTE — Progress Notes (Signed)
Histology and Location of Primary Cancer:Stage IV (T2a, N1, M1b) non-small cell lung cancer, adenocarcinoma with positive EGFR mutation (deletion 19) presented with right middle lobe lung mass, right hilar adenopathy as well as metastatic disease to the bone, brain and left adrenal diagnosed in July 2016,now enlarging left adrenal gland mets   Location(s) of Symptomatic tumor(s): Left adrenal gland   Past/Anticipated chemotherapy by medical oncology, if any: 03-11-17 Dr. Julien Nordmann   03-08-17  IMPRESSION: 1. Stable radiation changes involving the right paramediastinal long without findings suspicious for recurrent tumor. No mediastinal or hilar mass or adenopathy. 2. No findings for pulmonary metastatic disease. 3. Enlarging left adrenal gland lesion consistent with metastasis. 4. No new metastatic disease involving the abdomen/pelvis. 5. Stable osseous metastatic disease. No new or progressive Findings.   IMPRESSION: 03-08-17 1. Stable radiation changes involving the right paramediastinal long without findings suspicious for recurrent tumor. No mediastinal or hilar mass or adenopathy. 2. No findings for pulmonary metastatic disease. 3. Enlarging left adrenal gland lesion consistent with metastasis. 4. No new metastatic disease involving the abdomen/pelvis. 5. Stable osseous metastatic disease. No new or progressive findings.   CURRENT THERAPY: 1) Tagrisso 80 mg by mouth daily started 06/02/2016.  Status post 9 months of treatment. 2) Xgeva 120 g subcutaneously on monthly basis.   PRIOR THERAPY: 1) palliative radiotherapy to the brain as well as metastatic bone lesions in the pelvis. 2) Tarceva 150 mg by mouth daily started on 09/25/2014. Status post 5 months of treatment. 3) status post palliative radiotherapy to the right middle lobe lung mass for treatment of hemoptysis. 4) Tarceva 100 mg by mouth daily started 02/19/2015 status post 13 months of treatment. This was  discontinued in May 2018 after the patient had disease progression and development of T790M resistant mutation.    Patient's main complaints related to symptomatic tumor(s) [id number] pain in the left flank   Pain on a scale of 0-10 is: No   If Spine Met(s), symptoms, if any, include:  Bowel/Bladder retention or incontinence (please describe):Voiding 3-4 times a day and bowel movents twice a day  Numbness or weakness in extremities (please describe): Right shoulder numbness to right hand  Current Decadron regimen, if applicable:No   Ambulatory status? Walker? Wheelchair?: Ambulatory  SAFETY ISSUES: Prior radiation? : 07-21-15  08-03-15 Right hilar tumor 30 gY/10 FX                                                          08/23/14-09/07/14 30gY/12 FX to Right lung, whole brain, left  hip    Pacemaker/ICD? : No  Possible current pregnancy? :No  Is the patient on methotrexate? No Wt Readings from Last 3 Encounters:  03/19/17 108 lb (49 kg)  03/11/17 110 lb (49.9 kg)  02/07/17 108 lb 6.4 oz (49.2 kg)  BP (!) 94/56 (BP Location: Right Arm, Patient Position: Sitting, Cuff Size: Normal)   Pulse (!) 108   Temp 98.2 F (36.8 C) (Oral)   Resp 18   Ht _0  (1.499 m)   Wt 108 lb (49 kg)   LMP  (LMP Unknown) Comment: about 2 years ago, pt states not pregnanct  SpO2 100%   BMI 21.81 kg/m   BP 99/61   Pulse (!) 110   Temp 98.2 F (36.8 C) (Oral)  Resp 18   Ht _0  (1.499 m)   Wt 108 lb (49 kg)   LMP  (LMP Unknown) Comment: about 2 years ago, pt states not pregnanct  SpO2 100%   BMI 21.81 kg/m   Additional Complaints / other details:

## 2017-03-19 ENCOUNTER — Encounter: Payer: Self-pay | Admitting: Radiation Oncology

## 2017-03-19 ENCOUNTER — Other Ambulatory Visit: Payer: Self-pay

## 2017-03-19 ENCOUNTER — Ambulatory Visit
Admission: RE | Admit: 2017-03-19 | Discharge: 2017-03-19 | Disposition: A | Discharge status: Home / Self Care | Payer: Medicare Other | Source: Ambulatory Visit | Attending: Radiation Oncology | Admitting: Radiation Oncology

## 2017-03-19 VITALS — BP 99/61 | HR 110 | Temp 98.2°F | Resp 18 | Ht 59.0 in | Wt 108.0 lb

## 2017-03-19 DIAGNOSIS — Z9889 Other specified postprocedural states: Secondary | ICD-10-CM | POA: Insufficient documentation

## 2017-03-19 DIAGNOSIS — E876 Hypokalemia: Secondary | ICD-10-CM | POA: Diagnosis not present

## 2017-03-19 DIAGNOSIS — Z8249 Family history of ischemic heart disease and other diseases of the circulatory system: Secondary | ICD-10-CM | POA: Insufficient documentation

## 2017-03-19 DIAGNOSIS — Z881 Allergy status to other antibiotic agents status: Secondary | ICD-10-CM | POA: Insufficient documentation

## 2017-03-19 DIAGNOSIS — Z923 Personal history of irradiation: Secondary | ICD-10-CM | POA: Insufficient documentation

## 2017-03-19 DIAGNOSIS — C3491 Malignant neoplasm of unspecified part of right bronchus or lung: Secondary | ICD-10-CM

## 2017-03-19 DIAGNOSIS — C7931 Secondary malignant neoplasm of brain: Secondary | ICD-10-CM | POA: Insufficient documentation

## 2017-03-19 DIAGNOSIS — C7972 Secondary malignant neoplasm of left adrenal gland: Secondary | ICD-10-CM

## 2017-03-19 DIAGNOSIS — Z79899 Other long term (current) drug therapy: Secondary | ICD-10-CM | POA: Insufficient documentation

## 2017-03-19 DIAGNOSIS — C7951 Secondary malignant neoplasm of bone: Secondary | ICD-10-CM | POA: Insufficient documentation

## 2017-03-19 DIAGNOSIS — C342 Malignant neoplasm of middle lobe, bronchus or lung: Secondary | ICD-10-CM | POA: Diagnosis present

## 2017-03-19 DIAGNOSIS — Z888 Allergy status to other drugs, medicaments and biological substances status: Secondary | ICD-10-CM | POA: Diagnosis not present

## 2017-03-19 NOTE — Addendum Note (Signed)
Encounter addended by: Malena Edman, RN on: 03/19/2017 1:03 PM  Actions taken: Charge Capture section accepted

## 2017-03-19 NOTE — Progress Notes (Signed)
Radiation Oncology         [phone] ________________________________  Name: Deborah Foster MRN: [id number]  Date: 03/19/2017  DOB: 1970-05-31  Follow up Note  CC: [name], MD  [name], *  Diagnosis:   Stage IV T2a, N1, M1b NSCLC, adenocarcinoma of the right middle lobe with metastatic disease to bone and brain.  Interval Since Last Radiation:  1 year, 8 months  07/21/2015 to 08/03/2015:  The Right hilar tumor was treated to 30 Gy in 10 fractions at 3 Gy per fraction.   08/23/14-09/07/14:  30 Gy in 12 fractions was prescribed to the right lung, whole brain, and left hip.  Narrative:  The patient returns today for routine follow-up. Of note her history includes stage IV lung cancer which was identified in the summer of 2016. She received whole brain and right lung radiotherapy along the left hip treatment in August 2016. She's been followed in surveillance since, she recurred locally in the chest, and received radiotherapy in the summer of 2017. She comes today for follow-up of her CNS. Her most recent MRI of the brain on 03/15/17 revealed no evidence of new disease, and her treated lesions in the brain are stable. She was found to have some disease in her left adrenal gland with her last restaging scan but was continuing with Dr. Julien Nordmann on [place] and [place]. On her most recent CT on 03/08/17, there was progression only in the left adrenal gland now measuring 19 x 18 mm, previously 12 x 10.5 mm. Dr. Julien Nordmann is going to continue [place] and [place], and would like Dr. Lisbeth Renshaw to consider stereotactic radiotherapy to this site.   On review of systems, the patient reports that she is doing well overall. She has rare episodes of left sided abdominal discomfort. She denies any chest pain, shortness of breath, cough, fevers, chills, night sweats, unintended weight changes. She denies any bowel or bladder disturbances, and denies nausea or vomiting. She  denies any new musculoskeletal  or joint aches or pains, new skin lesions or concerns. A complete review of systems is obtained and is otherwise negative.  Past Medical History:  Past Medical History:  Diagnosis Date  . Bone metastasis ([place])   . Hemoptysis   . Hypokalemia   . lung ca dx'd 07/2014  . Lung mass   . Metastasis to adrenal gland ([place])   . Metastasis to brain ([place])   . Pneumonia   . Radiation 08/23/14-09/07/14   Brain/chest and left hip 30 Gy 12 Fx  . URI (upper respiratory infection) 02-02-2015    Past Surgical History: Past Surgical History:  Procedure Laterality Date  . IR GENERIC HISTORICAL  07/14/2015   IR RADIOLOGIST EVAL & MGMT 07/14/2015 Greggory Keen, MD [facility] INTERV RAD  . VIDEO BRONCHOSCOPY Bilateral 08/09/2014   Procedure: VIDEO BRONCHOSCOPY WITHOUT FLUORO;  Surgeon: [name], MD;  Location: [facility] ENDOSCOPY;  Service: Cardiopulmonary;  Laterality: Bilateral;  . VIDEO BRONCHOSCOPY Bilateral 07/06/2015   Procedure: VIDEO BRONCHOSCOPY WITHOUT FLUORO;  Surgeon: [name], MD;  Location: [facility] ENDOSCOPY;  Service: Cardiopulmonary;  Laterality: Bilateral;    Social History:  Social History   Socioeconomic History  . Marital status: Married    Spouse name: Not on file  . Number of children: 3  . Years of education: Not on file  . Highest education level: Not on file  Social Needs  . Financial resource strain: Not on file  . Food insecurity - worry: Not on  file  . Food insecurity - inability: Not on file  . Transportation needs - medical: Not on file  . Transportation needs - non-medical: Not on file  Occupational History  . Occupation: unemployed  Tobacco Use  . Smoking status: Never Smoker  . Smokeless tobacco: Never Used  Substance and Sexual Activity  . Alcohol use: No    Alcohol/week: 0.0 oz  . Drug use: No  . Sexual activity: Not Currently  Other Topics Concern  . Not on file  Social History Narrative   Patient moved to [place] in [place].   Had been in [place] since that time.  The patient has three sons that are elementary school age, and she is active in her church.  Family History: Family History  Problem Relation Age of Onset  . Hyperlipidemia Mother     ALLERGIES:  is allergic to other and doxycycline.  Meds: Current Outpatient Medications  Medication Sig Dispense Refill  . benzonatate (TESSALON) 100 MG capsule Take 1 capsule (100 mg total) by mouth every 8 (eight) hours as needed. 20 capsule 0  . Calcium Carbonate-Vitamin D (CALCIUM 500 + D) 500-125 MG-UNIT TABS Take 500 mg by mouth 2 (two) times daily.    . chlorpheniramine-HYDROcodone (TUSSIONEX) 10-8 MG/5ML SUER Take 5 mLs by mouth every 12 (twelve) hours as needed. 140 mL 0  . Cyanocobalamin (VITAMIN B-12) 2500 MCG SUBL Place 2,500 mcg under the tongue daily.    Marland Kitchen Dextromethorphan Polistirex (DELSYM PO) Take by mouth.    . ENSURE (ENSURE) Take 1 Can by mouth 2 (two) times daily between meals.     . ferrous sulfate 325 (65 FE) MG EC tablet Take 325 mg by mouth daily with breakfast.    . folic acid (FOLVITE) 381 MCG tablet Take 1 tablet (800 mcg total) by mouth daily. 30 tablet 1  . loratadine (CLARITIN) 10 MG tablet Take 1 tablet (10 mg total) by mouth daily. 30 tablet 0  . Nutritional Supplements (JUICE PLUS FIBRE PO) Take 1 capsule by mouth daily. Reported on 07/28/2015    . osimertinib mesylate (TAGRISSO) 80 MG tablet Take 1 tablet (80 mg total) by mouth daily. 30 tablet 2  . ranitidine (ZANTAC) 300 MG tablet Take 1 tablet (300 mg total) by mouth at bedtime. 30 tablet 6  . TURMERIC PO Take 1 capsule by mouth 2 (two) times daily. Turmeric Superior 750 mg po bid    . Turmeric POWD Take by mouth.    . AMBULATORY NON FORMULARY MEDICATION Medication Name: MAGIC MOUTHWASH 2 % Viscous Lidocaine  Maalox Benadryl Susp.  Disp: 1:1:1 242ml bottle Sig: 3ml PO Swish & Spit  q 3-4hrs. PRN mouth sores (Patient not taking: Reported on 03/19/2017) 200 mL 3  . clindamycin (CLINDAGEL) 1  % gel Apply 1 application topically 2 (two) times daily.    . fluticasone (VERAMYST) 27.5 MCG/SPRAY nasal spray Place 2 sprays into the nose daily.    Marland Kitchen OVER THE COUNTER MEDICATION "Deep penetrating pain relief oil"     No current facility-administered medications for this encounter.     Physical Findings:  height is 4\' 11"  (1.499 m) and weight is 108 lb (49 kg). Her oral temperature is 98.2 F (36.8 C). Her blood pressure is 99/61 and her pulse is 110 (abnormal). Her respiration is 18 and oxygen saturation is 100%.   Pain Assessment Pain Score: 0-No pain/10  In general this is a well appearing Asian female in no acute distress. She's alert  and oriented x4 and appropriate throughout the examination. Cardiopulmonary assessment is negative for acute distress and she exhibits normal effort. She is neurologically intact grossly without change.   Lab Findings: Lab Results  Component Value Date   WBC 7.1 03/11/2017   HGB 13.7 02/07/2017   HCT 41.8 03/11/2017   MCV 96.8 03/11/2017   PLT 270 03/11/2017     Radiographic Findings: Ct Chest W Contrast  Result Date: 03/08/2017 CLINICAL DATA:  Restaging lung cancer. EXAM: CT CHEST, ABDOMEN, AND PELVIS WITH CONTRAST TECHNIQUE: Multidetector CT imaging of the chest, abdomen and pelvis was performed following the standard protocol during bolus administration of intravenous contrast. CONTRAST:  146mL ISOVUE-300 IOPAMIDOL (ISOVUE-300) INJECTION 61% COMPARISON:  12/07/2016 FINDINGS: CT CHEST FINDINGS Cardiovascular: The heart is normal in size. No pericardial effusion. The aorta is normal in caliber. No atherosclerotic calcifications or dissection. No coronary artery calcifications. Stable enlarged pulmonary artery suggesting pulmonary hypertension. Mediastinum/Nodes: No mediastinal or hilar mass or lymphadenopathy. Small scattered lymph nodes are stable. The esophagus is grossly normal. Lungs/Pleura: Stable radiation changes involving the right  paramediastinal lung. No findings to suggest recurrent tumor. No findings suspicious for pulmonary metastatic disease. Few tiny scattered sub 3 mm pulmonary nodules are stable. No pleural effusion. Musculoskeletal: Stable sclerotic bone lesions involving the sternum, right clavicle, ribs and spine. CT ABDOMEN PELVIS FINDINGS Hepatobiliary: No focal hepatic lesions to suggest metastatic disease. Mild stable intrahepatic and extrahepatic biliary dilatation. The gallbladder appears normal. Pancreas: No mass, inflammation or ductal dilatation. Spleen: Normal size.  No focal lesions. Adrenals/Urinary Tract: Enlarging left adrenal gland lesion consistent with a metastatic focus. It previously measured 12.0 x 10.5 mm and now measures 19 x 18 mm. No right-sided adrenal gland lesion. The kidneys and bladder are normal. Stomach/Bowel: The stomach, duodenum, small bowel and colon are unremarkable. No acute inflammatory changes, mass lesions or obstructive findings. The terminal ileum is normal. The appendix is normal. Vascular/Lymphatic: The aorta is normal in caliber. No dissection. The branch vessels are patent. The major venous structures are patent. No mesenteric or retroperitoneal mass or adenopathy. Small scattered lymph nodes are noted. Reproductive: The uterus and ovaries are unremarkable. Other: No pelvic adenopathy or free pelvic fluid collections. No inguinal adenopathy. No subcutaneous lesions. Musculoskeletal: Stable largely sclerotic bone lesions involving the pelvis and hips. IMPRESSION: 1. Stable radiation changes involving the right paramediastinal long without findings suspicious for recurrent tumor. No mediastinal or hilar mass or adenopathy. 2. No findings for pulmonary metastatic disease. 3. Enlarging left adrenal gland lesion consistent with metastasis. 4. No new metastatic disease involving the abdomen/pelvis. 5. Stable osseous metastatic disease. No new or progressive findings. Electronically Signed    By: Marijo Sanes M.D.   On: 03/08/2017 08:38   Mr Jeri Cos YJ Contrast  Result Date: 03/15/2017 CLINICAL DATA:  47 year old female with stage IV adenocarcinoma of the right lung middle lobe. Status post whole brain radiation in 2016. Restaging. EXAM: MRI HEAD WITHOUT AND WITH CONTRAST TECHNIQUE: Multiplanar, multiecho pulse sequences of the brain and surrounding structures were obtained without and with intravenous contrast. CONTRAST:  48mL MULTIHANCE GADOBENATE DIMEGLUMINE 529 MG/ML IV SOLN COMPARISON:  12/13/2016 and earlier. FINDINGS: BRAIN New Lesions: None. Larger lesions: None. Stable or Smaller lesions: Multiple treated brain lesions with prominent chronic hemosiderin deposition. Many of these are not enhancing, but rather demonstrate intrinsic T1 signal probably related to the blood products: 4 mm lesion located left central cerebellum and seen on series 19, image 45. 17 mm lesion located inferior  junction of the left temporal and occipital lobes and seen on series 19, image 67. 4-5 mm lesion located right lateral occiput and seen on series 19, image 70. 5 mm lesion located right occipital pole and seen on image 75. 12 mm lesion located anterior left frontal lobe and seen on image 104. 4-5 mm lesion located medial anterior right frontal lobe and seen on image 106. 7 mm lesion located medial right parietal lobe and seen on image 111. Other Brain findings: Mild if any FLAIR signal abnormality surrounding the larger lesions. No new T2 or FLAIR signal abnormality. The punctate microhemorrhage in the lateral right parietal lobe on series 8, image 17 appears stable since November and is without enhancement or surrounding T2/FLAIR signal abnormality. No new hemosiderin identified. No restricted diffusion to suggest acute infarction. No midline shift, mass effect, ventriculomegaly, extra-axial collection or acute intracranial hemorrhage. Cervicomedullary junction and pituitary are within normal limits. Vascular:  Major intracranial vascular flow voids are stable. Skull and upper cervical spine: Negative visible cervical spine and spinal cord. Stable visible bone marrow signal. Sinuses/Orbits: Stable and negative. Other: Mastoid air cells remain clear. Visible internal auditory structures appear normal. Scalp and face soft tissues appear negative. IMPRESSION: 1. Continued stable post treatment appearance of the brain. Total of 7 treated brain metastases with chronic hemosiderin and no convincing enhancement, each annotated on series 19. 2. Occasional other punctate micro-hemorrhages in the brain which appear benign, including the right parietal lobe focus seen in November which is unchanged. 3. No new intracranial abnormality. Electronically Signed   By: Genevie Ann M.D.   On: 03/15/2017 17:03   Ct Abdomen Pelvis W Contrast  Result Date: 03/08/2017 CLINICAL DATA:  Restaging lung cancer. EXAM: CT CHEST, ABDOMEN, AND PELVIS WITH CONTRAST TECHNIQUE: Multidetector CT imaging of the chest, abdomen and pelvis was performed following the standard protocol during bolus administration of intravenous contrast. CONTRAST:  160mL ISOVUE-300 IOPAMIDOL (ISOVUE-300) INJECTION 61% COMPARISON:  12/07/2016 FINDINGS: CT CHEST FINDINGS Cardiovascular: The heart is normal in size. No pericardial effusion. The aorta is normal in caliber. No atherosclerotic calcifications or dissection. No coronary artery calcifications. Stable enlarged pulmonary artery suggesting pulmonary hypertension. Mediastinum/Nodes: No mediastinal or hilar mass or lymphadenopathy. Small scattered lymph nodes are stable. The esophagus is grossly normal. Lungs/Pleura: Stable radiation changes involving the right paramediastinal lung. No findings to suggest recurrent tumor. No findings suspicious for pulmonary metastatic disease. Few tiny scattered sub 3 mm pulmonary nodules are stable. No pleural effusion. Musculoskeletal: Stable sclerotic bone lesions involving the sternum,  right clavicle, ribs and spine. CT ABDOMEN PELVIS FINDINGS Hepatobiliary: No focal hepatic lesions to suggest metastatic disease. Mild stable intrahepatic and extrahepatic biliary dilatation. The gallbladder appears normal. Pancreas: No mass, inflammation or ductal dilatation. Spleen: Normal size.  No focal lesions. Adrenals/Urinary Tract: Enlarging left adrenal gland lesion consistent with a metastatic focus. It previously measured 12.0 x 10.5 mm and now measures 19 x 18 mm. No right-sided adrenal gland lesion. The kidneys and bladder are normal. Stomach/Bowel: The stomach, duodenum, small bowel and colon are unremarkable. No acute inflammatory changes, mass lesions or obstructive findings. The terminal ileum is normal. The appendix is normal. Vascular/Lymphatic: The aorta is normal in caliber. No dissection. The branch vessels are patent. The major venous structures are patent. No mesenteric or retroperitoneal mass or adenopathy. Small scattered lymph nodes are noted. Reproductive: The uterus and ovaries are unremarkable. Other: No pelvic adenopathy or free pelvic fluid collections. No inguinal adenopathy. No subcutaneous lesions. Musculoskeletal: Stable  largely sclerotic bone lesions involving the pelvis and hips. IMPRESSION: 1. Stable radiation changes involving the right paramediastinal long without findings suspicious for recurrent tumor. No mediastinal or hilar mass or adenopathy. 2. No findings for pulmonary metastatic disease. 3. Enlarging left adrenal gland lesion consistent with metastasis. 4. No new metastatic disease involving the abdomen/pelvis. 5. Stable osseous metastatic disease. No new or progressive findings. Electronically Signed   By: Marijo Sanes M.D.   On: 03/08/2017 08:38    Impression/Plan: 1. Stage IV T2a, N1, M1b NSCLC, adenocarcinoma of the right middle lobe with metastatic disease to bone and brain. The patient's MRI results were reviewed and are stable and she can proceed with her  next MRI in 4 months per conference recommendations. We will coordinate this with her. Regarding her left adrenal metastasis, Dr. Julien Nordmann would like [place] to consider stereotactic body radiotherapy (SBRT) now to this site, and Dr. Lisbeth Renshaw is in agreement. We discussed the risks, benefits, short, and long term effects of radiotherapy, and the patient is interested in proceeding. Dr. Lisbeth Renshaw discusses the delivery and logistics of radiotherapy and anticipates a course of 5-8 fractions. Written consent is obtained and placed in the chart, a copy was provided to the patient. She will be contacted for simulation by our staff.  In a visit lasting 45 minutes, greater than 50% of the time was spent face to face discussing her case and coordinating the patient's care.  The above documentation reflects my direct findings during this shared patient visit. Please see the separate note by Dr. Lisbeth Renshaw on this date for the remainder of the patient's plan of care.     Carola Rhine, PAC

## 2017-03-21 ENCOUNTER — Encounter: Payer: Self-pay | Admitting: Internal Medicine

## 2017-03-21 NOTE — Progress Notes (Signed)
Patient called requesting appointment to set up payment arrangements for her bills. Advised patient she would need to contact billing at [phone] to set up payment arrangements. She verbalized understanding.

## 2017-03-22 ENCOUNTER — Ambulatory Visit
Admission: RE | Admit: 2017-03-22 | Discharge: 2017-03-22 | Disposition: A | Discharge status: Home / Self Care | Payer: Medicare Other | Source: Ambulatory Visit | Attending: Radiation Oncology | Admitting: Radiation Oncology

## 2017-03-22 DIAGNOSIS — Z510 Encounter for antineoplastic radiation therapy: Secondary | ICD-10-CM | POA: Diagnosis not present

## 2017-03-22 DIAGNOSIS — C342 Malignant neoplasm of middle lobe, bronchus or lung: Secondary | ICD-10-CM | POA: Insufficient documentation

## 2017-03-22 DIAGNOSIS — C7972 Secondary malignant neoplasm of left adrenal gland: Secondary | ICD-10-CM

## 2017-03-28 DIAGNOSIS — C342 Malignant neoplasm of middle lobe, bronchus or lung: Secondary | ICD-10-CM | POA: Diagnosis not present

## 2017-04-01 ENCOUNTER — Ambulatory Visit
Admission: RE | Admit: 2017-04-01 | Discharge: 2017-04-01 | Disposition: A | Discharge status: Home / Self Care | Payer: Medicare Other | Source: Ambulatory Visit | Attending: Radiation Oncology | Admitting: Radiation Oncology

## 2017-04-01 DIAGNOSIS — C342 Malignant neoplasm of middle lobe, bronchus or lung: Secondary | ICD-10-CM | POA: Diagnosis not present

## 2017-04-02 ENCOUNTER — Ambulatory Visit: Payer: Medicare Other | Admitting: Radiation Oncology

## 2017-04-02 ENCOUNTER — Ambulatory Visit
Admission: RE | Admit: 2017-04-02 | Discharge: 2017-04-02 | Disposition: A | Discharge status: Home / Self Care | Payer: Medicare Other | Source: Ambulatory Visit | Attending: Radiation Oncology | Admitting: Radiation Oncology

## 2017-04-02 DIAGNOSIS — C342 Malignant neoplasm of middle lobe, bronchus or lung: Secondary | ICD-10-CM | POA: Diagnosis not present

## 2017-04-02 NOTE — Progress Notes (Signed)
Pt here for patient teaching.  Pt given Radiation and You booklet and skin care instructions.  Reviewed areas of pertinence such as diarrhea, fatigue, nausea and vomiting, skin changes and urinary and bladder changes . Pt able to give teach back of to pat skin, use unscented/gentle soap, have Imodium on hand and drink plenty of water,Pt demonstrated understanding, needs reinforcement, no evidence of learning, refused teaching and of information given and will contact nursing with any questions or concerns.     [email or web address]

## 2017-04-03 ENCOUNTER — Ambulatory Visit
Admission: RE | Admit: 2017-04-03 | Discharge: 2017-04-03 | Disposition: A | Discharge status: Home / Self Care | Payer: Medicare Other | Source: Ambulatory Visit | Attending: Radiation Oncology | Admitting: Radiation Oncology

## 2017-04-03 DIAGNOSIS — C342 Malignant neoplasm of middle lobe, bronchus or lung: Secondary | ICD-10-CM | POA: Diagnosis not present

## 2017-04-04 ENCOUNTER — Ambulatory Visit: Payer: Medicare Other | Admitting: Radiation Oncology

## 2017-04-04 ENCOUNTER — Ambulatory Visit
Admission: RE | Admit: 2017-04-04 | Discharge: 2017-04-04 | Disposition: A | Discharge status: Home / Self Care | Payer: Medicare Other | Source: Ambulatory Visit | Attending: Radiation Oncology | Admitting: Radiation Oncology

## 2017-04-04 DIAGNOSIS — C342 Malignant neoplasm of middle lobe, bronchus or lung: Secondary | ICD-10-CM | POA: Diagnosis not present

## 2017-04-05 ENCOUNTER — Ambulatory Visit
Admission: RE | Admit: 2017-04-05 | Discharge: 2017-04-05 | Disposition: A | Discharge status: Home / Self Care | Payer: Medicare Other | Source: Ambulatory Visit | Attending: Radiation Oncology | Admitting: Radiation Oncology

## 2017-04-05 DIAGNOSIS — C342 Malignant neoplasm of middle lobe, bronchus or lung: Secondary | ICD-10-CM | POA: Diagnosis not present

## 2017-04-08 ENCOUNTER — Ambulatory Visit: Payer: Self-pay | Admitting: Internal Medicine

## 2017-04-08 ENCOUNTER — Ambulatory Visit
Admission: RE | Admit: 2017-04-08 | Discharge: 2017-04-08 | Disposition: A | Discharge status: Home / Self Care | Payer: Medicare Other | Source: Ambulatory Visit | Attending: Radiation Oncology | Admitting: Radiation Oncology

## 2017-04-08 DIAGNOSIS — C342 Malignant neoplasm of middle lobe, bronchus or lung: Secondary | ICD-10-CM | POA: Diagnosis not present

## 2017-04-09 ENCOUNTER — Ambulatory Visit: Payer: Medicare Other

## 2017-04-09 ENCOUNTER — Ambulatory Visit
Admission: RE | Admit: 2017-04-09 | Discharge: 2017-04-09 | Disposition: A | Discharge status: Home / Self Care | Payer: Medicare Other | Source: Ambulatory Visit | Attending: Radiation Oncology | Admitting: Radiation Oncology

## 2017-04-09 ENCOUNTER — Inpatient Hospital Stay: Payer: Medicare Other

## 2017-04-09 ENCOUNTER — Inpatient Hospital Stay: Payer: Medicare Other | Attending: Internal Medicine | Admitting: Internal Medicine

## 2017-04-09 ENCOUNTER — Encounter: Payer: Self-pay | Admitting: Internal Medicine

## 2017-04-09 ENCOUNTER — Telehealth: Payer: Self-pay | Admitting: Internal Medicine

## 2017-04-09 ENCOUNTER — Encounter: Payer: Self-pay | Admitting: *Deleted

## 2017-04-09 VITALS — BP 103/56 | HR 109 | Temp 97.6°F | Resp 18 | Wt 103.0 lb

## 2017-04-09 DIAGNOSIS — Z79899 Other long term (current) drug therapy: Secondary | ICD-10-CM | POA: Diagnosis not present

## 2017-04-09 DIAGNOSIS — Z9221 Personal history of antineoplastic chemotherapy: Secondary | ICD-10-CM | POA: Insufficient documentation

## 2017-04-09 DIAGNOSIS — E889 Metabolic disorder, unspecified: Secondary | ICD-10-CM

## 2017-04-09 DIAGNOSIS — C3491 Malignant neoplasm of unspecified part of right bronchus or lung: Secondary | ICD-10-CM

## 2017-04-09 DIAGNOSIS — C7931 Secondary malignant neoplasm of brain: Secondary | ICD-10-CM | POA: Diagnosis not present

## 2017-04-09 DIAGNOSIS — Z923 Personal history of irradiation: Secondary | ICD-10-CM | POA: Diagnosis not present

## 2017-04-09 DIAGNOSIS — C7972 Secondary malignant neoplasm of left adrenal gland: Secondary | ICD-10-CM | POA: Diagnosis not present

## 2017-04-09 DIAGNOSIS — R110 Nausea: Secondary | ICD-10-CM | POA: Diagnosis not present

## 2017-04-09 DIAGNOSIS — M898X9 Other specified disorders of bone, unspecified site: Secondary | ICD-10-CM

## 2017-04-09 DIAGNOSIS — C342 Malignant neoplasm of middle lobe, bronchus or lung: Secondary | ICD-10-CM | POA: Diagnosis present

## 2017-04-09 DIAGNOSIS — C7951 Secondary malignant neoplasm of bone: Secondary | ICD-10-CM

## 2017-04-09 DIAGNOSIS — M9080 Osteopathy in diseases classified elsewhere, unspecified site: Secondary | ICD-10-CM

## 2017-04-09 DIAGNOSIS — R590 Localized enlarged lymph nodes: Secondary | ICD-10-CM | POA: Insufficient documentation

## 2017-04-09 DIAGNOSIS — C3431 Malignant neoplasm of lower lobe, right bronchus or lung: Secondary | ICD-10-CM

## 2017-04-09 LAB — CMP (CANCER CENTER ONLY)
ALT: 19 U/L (ref 0–55)
AST: 19 U/L (ref 5–34)
Albumin: 3.8 g/dL (ref 3.5–5.0)
Alkaline Phosphatase: 48 U/L (ref 40–150)
Anion gap: 8 (ref 3–11)
BILIRUBIN TOTAL: 0.4 mg/dL (ref 0.2–1.2)
BUN: 13 mg/dL (ref 7–26)
CO2: 25 mmol/L (ref 22–29)
CREATININE: 0.69 mg/dL (ref 0.60–1.10)
Calcium: 9 mg/dL (ref 8.4–10.4)
Chloride: 104 mmol/L (ref 98–109)
GFR, Est AFR Am: >60 mL/min (ref >60)
Glucose, Bld: 85 mg/dL (ref 70–140)
POTASSIUM: 4.2 mmol/L (ref 3.5–5.1)
Sodium: 137 mmol/L (ref 136–145)
TOTAL PROTEIN: 7.1 g/dL (ref 6.4–8.3)

## 2017-04-09 LAB — CBC WITH DIFFERENTIAL (CANCER CENTER ONLY)
BASOS ABS: 0 10*3/uL (ref 0.0–0.1)
BASOS PCT: 0 %
EOS ABS: 0.2 10*3/uL (ref 0.0–0.5)
Eosinophils Relative: 3 %
HCT: 44.4 % (ref 34.8–46.6)
Hemoglobin: 14.7 g/dL (ref 11.6–15.9)
LYMPHS PCT: 9 %
Lymphs Abs: 0.5 10*3/uL — ABNORMAL LOW (ref 0.9–3.3)
MCH: 31.8 pg (ref 25.1–34.0)
MCHC: 33.2 g/dL (ref 31.5–36.0)
MCV: 95.9 fL (ref 79.5–101.0)
Monocytes Absolute: 0.5 10*3/uL (ref 0.1–0.9)
Monocytes Relative: 10 %
Neutro Abs: 4.3 10*3/uL (ref 1.5–6.5)
Neutrophils Relative %: 78 %
PLATELETS: 265 10*3/uL (ref 145–400)
RBC: 4.63 MIL/uL (ref 3.70–5.45)
RDW: 13.6 % (ref 11.2–14.5)
WBC: 5.4 10*3/uL (ref 3.9–10.3)

## 2017-04-09 MED ORDER — DENOSUMAB 120 MG/1.7ML ~~LOC~~ SOLN
SUBCUTANEOUS | Status: AC
  Filled 2017-04-09: qty 1.7

## 2017-04-09 MED ORDER — DENOSUMAB 120 MG/1.7ML ~~LOC~~ SOLN
120.0000 mg | Freq: Once | SUBCUTANEOUS | Status: AC
  Administered 2017-04-09: 120 mg via SUBCUTANEOUS

## 2017-04-09 MED ORDER — PROCHLORPERAZINE MALEATE 10 MG PO TABS: 10.0000 mg | ORAL_TABLET | Freq: Four times a day (QID) | ORAL | 0 refills | Status: DC | PRN

## 2017-04-09 MED FILL — PROCHLORPERAZINE 10 MG TAB: 10 | 7 days supply | Qty: 30 | Fill #0

## 2017-04-09 NOTE — Telephone Encounter (Signed)
Appointments scheduled AVS/Calendar printed per 3/26 los °

## 2017-04-09 NOTE — Progress Notes (Signed)
[place] [phone]   [phone]  OFFICE PROGRESS NOTE  [name], MD [place] [address]  DIAGNOSIS: Stage IV (T2a, N1, M1b) non-small cell lung cancer, adenocarcinoma with positive EGFR mutation (deletion 60) presented with right middle lobe lung mass, right hilar adenopathy as well as metastatic disease to the bone, brain and left adrenal diagnosed in July 2016.  PRIOR THERAPY: 1) palliative radiotherapy to the brain as well as metastatic bone lesions in the pelvis. 2) Tarceva 150 mg by mouth daily started on 09/25/2014. Status post 5 months of treatment. 3) status post palliative radiotherapy to the right middle lobe lung mass for treatment of hemoptysis. 4) Tarceva 100 mg by mouth daily started 02/19/2015 status post 13 months of treatment. This was discontinued in May 2018 after the patient had disease progression and development of T790M resistant mutation.   CURRENT THERAPY: 1) Tagrisso 80 mg by mouth daily started 06/02/2016.  Status post 9 months of treatment. 2) Xgeva 120 g subcutaneously on monthly basis.  INTERVAL HISTORY: Deborah Foster 47 y.o. female returns to the clinic today for follow-up visit accompanied by her [place] interpreter.  The patient is feeling fine today except for occasional nausea and upset stomach.  She is currently undergoing palliative radiotherapy to the enlarging left adrenal gland lesion.  She has 2 more fractions of radiotherapy to complete this course.  She denied having any current chest pain, shortness of breath, cough or hemoptysis.  She denied having any fever or chills.  She has no diarrhea or constipation.  She denied having any skin rash.  She is here today for evaluation and repeat blood work.   MEDICAL HISTORY: Past Medical History:  Diagnosis Date  . Bone metastasis ([place])   . Hemoptysis   . Hypokalemia   . lung ca dx'd 07/2014  . Lung mass   . Metastasis  to adrenal gland ([place])   . Metastasis to brain ([place])   . Pneumonia   . Radiation 08/23/14-09/07/14   Brain/chest and left hip 30 Gy 12 Fx  . URI (upper respiratory infection) 02-02-2015    ALLERGIES:  is allergic to other and doxycycline.  MEDICATIONS:  Current Outpatient Medications  Medication Sig Dispense Refill  . AMBULATORY NON FORMULARY MEDICATION Medication Name: MAGIC MOUTHWASH 2 % Viscous Lidocaine  Maalox Benadryl Susp.  Disp: 1:1:1 284m bottle Sig: 116mPO Swish & Spit  q 3-4hrs. PRN mouth sores 200 mL 3  . benzonatate (TESSALON) 100 MG capsule Take 1 capsule (100 mg total) by mouth every 8 (eight) hours as needed. 20 capsule 0  . Calcium Carbonate-Vitamin D (CALCIUM 500 + D) 500-125 MG-UNIT TABS Take 500 mg by mouth 2 (two) times daily.    . chlorpheniramine-HYDROcodone (TUSSIONEX) 10-8 MG/5ML SUER Take 5 mLs by mouth every 12 (twelve) hours as needed. 140 mL 0  . Cyanocobalamin (VITAMIN B-12) 2500 MCG SUBL Place 2,500 mcg under the tongue daily.    . Marland KitchenNSURE (ENSURE) Take 1 Can by mouth 2 (two) times daily between meals.     . ferrous sulfate 325 (65 FE) MG EC tablet Take 325 mg by mouth daily with breakfast.    . fluticasone (VERAMYST) 27.5 MCG/SPRAY nasal spray Place 2 sprays into the nose daily.    . folic acid (FOLVITE) 80616CG tablet Take 1 tablet (800 mcg total) by mouth daily. 30 tablet 1  . loratadine (CLARITIN) 10 MG tablet Take 1 tablet (  10 mg total) by mouth daily. 30 tablet 0  . Nutritional Supplements (JUICE PLUS FIBRE PO) Take 1 capsule by mouth daily. Reported on 07/28/2015    . osimertinib mesylate (TAGRISSO) 80 MG tablet Take 1 tablet (80 mg total) by mouth daily. 30 tablet 2  . OVER THE COUNTER MEDICATION "Deep penetrating pain relief oil"    . ranitidine (ZANTAC) 300 MG tablet Take 1 tablet (300 mg total) by mouth at bedtime. 30 tablet 6  . TURMERIC PO Take 1 capsule by mouth 2 (two) times daily. Turmeric Superior 750 mg po bid    . clindamycin  (CLINDAGEL) 1 % gel Apply 1 application topically 2 (two) times daily.    Marland Kitchen Dextromethorphan Polistirex (DELSYM PO) Take by mouth.     No current facility-administered medications for this visit.     SURGICAL HISTORY:  Past Surgical History:  Procedure Laterality Date  . IR GENERIC HISTORICAL  07/14/2015   IR RADIOLOGIST EVAL & MGMT 07/14/2015 Greggory Keen, MD [facility] INTERV RAD  . VIDEO BRONCHOSCOPY Bilateral 08/09/2014   Procedure: VIDEO BRONCHOSCOPY WITHOUT FLUORO;  Surgeon: [name], MD;  Location: [facility] ENDOSCOPY;  Service: Cardiopulmonary;  Laterality: Bilateral;  . VIDEO BRONCHOSCOPY Bilateral 07/06/2015   Procedure: VIDEO BRONCHOSCOPY WITHOUT FLUORO;  Surgeon: [name], MD;  Location: [facility] ENDOSCOPY;  Service: Cardiopulmonary;  Laterality: Bilateral;    REVIEW OF SYSTEMS:  A comprehensive review of systems was negative except for: Gastrointestinal: positive for nausea   PHYSICAL EXAMINATION: General appearance: alert, cooperative and no distress Head: Normocephalic, without obvious abnormality, atraumatic Neck: no adenopathy, no JVD, supple, symmetrical, trachea midline and thyroid not enlarged, symmetric, no tenderness/mass/nodules Lymph nodes: Cervical, supraclavicular, and axillary nodes normal. Resp: clear to auscultation bilaterally Back: symmetric, no curvature. ROM normal. No CVA tenderness. Cardio: regular rate and rhythm, S1, S2 normal, no murmur, click, rub or gallop GI: soft, non-tender; bowel sounds normal; no masses,  no organomegaly Extremities: extremities normal, atraumatic, no cyanosis or edema  ECOG PERFORMANCE STATUS: 1 - Symptomatic but completely ambulatory  Blood pressure (!) 103/56, pulse (!) 109, temperature 97.6 F (36.4 C), temperature source Oral, resp. rate 18, weight 103 lb (46.7 kg), SpO2 100 %.  LABORATORY DATA: Lab Results  Component Value Date   WBC 5.4 04/09/2017   HGB 13.7 02/07/2017   HCT 44.4 04/09/2017   MCV 95.9  04/09/2017   PLT 265 04/09/2017      Chemistry      Component Value Date/Time   NA 138 03/11/2017 0942   NA 140 01/17/2017 1003   K 4.2 03/11/2017 0942   K 3.9 01/17/2017 1003   CL 104 03/11/2017 0942   CO2 27 03/11/2017 0942   CO2 29 01/17/2017 1003   BUN 17 03/11/2017 0942   BUN 14.8 01/17/2017 1003   CREATININE 0.69 03/11/2017 0942   CREATININE 0.8 01/17/2017 1003      Component Value Date/Time   CALCIUM 9.0 03/11/2017 0942   CALCIUM 8.7 01/17/2017 1003   ALKPHOS 45 03/11/2017 0942   ALKPHOS 50 01/17/2017 1003   AST 20 03/11/2017 0942   AST 23 01/17/2017 1003   ALT 21 03/11/2017 0942   ALT 27 01/17/2017 1003   BILITOT 0.4 03/11/2017 0942   BILITOT 0.46 01/17/2017 1003       RADIOGRAPHIC STUDIES: Mr Jeri Cos GT Contrast  Result Date: 03/15/2017 CLINICAL DATA:  47 year old female with stage IV adenocarcinoma of the right lung middle lobe. Status post whole brain radiation in 2016.  Restaging. EXAM: MRI HEAD WITHOUT AND WITH CONTRAST TECHNIQUE: Multiplanar, multiecho pulse sequences of the brain and surrounding structures were obtained without and with intravenous contrast. CONTRAST:  37m MULTIHANCE GADOBENATE DIMEGLUMINE 529 MG/ML IV SOLN COMPARISON:  12/13/2016 and earlier. FINDINGS: BRAIN New Lesions: None. Larger lesions: None. Stable or Smaller lesions: Multiple treated brain lesions with prominent chronic hemosiderin deposition. Many of these are not enhancing, but rather demonstrate intrinsic T1 signal probably related to the blood products: 4 mm lesion located left central cerebellum and seen on series 19, image 45. 17 mm lesion located inferior junction of the left temporal and occipital lobes and seen on series 19, image 67. 4-5 mm lesion located right lateral occiput and seen on series 19, image 70. 5 mm lesion located right occipital pole and seen on image 75. 12 mm lesion located anterior left frontal lobe and seen on image 104. 4-5 mm lesion located medial anterior  right frontal lobe and seen on image 106. 7 mm lesion located medial right parietal lobe and seen on image 111. Other Brain findings: Mild if any FLAIR signal abnormality surrounding the larger lesions. No new T2 or FLAIR signal abnormality. The punctate microhemorrhage in the lateral right parietal lobe on series 8, image 17 appears stable since November and is without enhancement or surrounding T2/FLAIR signal abnormality. No new hemosiderin identified. No restricted diffusion to suggest acute infarction. No midline shift, mass effect, ventriculomegaly, extra-axial collection or acute intracranial hemorrhage. Cervicomedullary junction and pituitary are within normal limits. Vascular: Major intracranial vascular flow voids are stable. Skull and upper cervical spine: Negative visible cervical spine and spinal cord. Stable visible bone marrow signal. Sinuses/Orbits: Stable and negative. Other: Mastoid air cells remain clear. Visible internal auditory structures appear normal. Scalp and face soft tissues appear negative. IMPRESSION: 1. Continued stable post treatment appearance of the brain. Total of 7 treated brain metastases with chronic hemosiderin and no convincing enhancement, each annotated on series 19. 2. Occasional other punctate micro-hemorrhages in the brain which appear benign, including the right parietal lobe focus seen in November which is unchanged. 3. No new intracranial abnormality. Electronically Signed   By: HGenevie AnnM.D.   On: 03/15/2017 17:03    ASSESSMENT AND PLAN:  This is a very pleasant 47years old Asian female with stage IV non-small cell lung cancer, adenocarcinoma with positive EGFR mutation with deletion in exon 142diagnosed in July 2016 status post treatment with Tarceva for a total of 18 months but this was discontinued secondary to disease progression and development of T790M resistant mutation. The patient was started on treatment with Tagrisso 80 mg by mouth daily status post 10  months of treatment. She continues to tolerate this treatment fairly well with no concerning complaints.  I recommended for her to continue her treatment with Tagrisso. The patient will also continue with the palliative radiotherapy to the left adrenal gland lesion. She will come back for follow-up visit in 1 month for evaluation with repeat blood work. For the nausea, I will start the patient on Compazine 10 mg p.o. every 6 hours as needed. For the metastatic bone disease, she will continue her current treatment with Xgeva. She was advised to call immediately if she has any concerning symptoms in the interval. The patient voices understanding of current disease status and treatment options and is in agreement with the current care plan. All questions were answered. The patient knows to call the clinic with any problems, questions or concerns. We can certainly  see the patient much sooner if necessary.  Disclaimer: This note was dictated with voice recognition software. Similar sounding words can inadvertently be transcribed and may not be corrected upon review.

## 2017-04-09 NOTE — Patient Instructions (Signed)
Denosumab injection  What is this medicine?  DENOSUMAB (den oh sue mab) slows bone breakdown. Prolia is used to treat osteoporosis in women after menopause and in men. Xgeva is used to prevent bone fractures and other bone problems caused by cancer bone metastases. Xgeva is also used to treat giant cell tumor of the bone.  This medicine may be used for other purposes; ask your health care provider or pharmacist if you have questions.  What should I tell my health care provider before I take this medicine?  They need to know if you have any of these conditions:  -dental disease  -eczema  -infection or history of infections  -kidney disease or on dialysis  -low blood calcium or vitamin D  -malabsorption syndrome  -scheduled to have surgery or tooth extraction  -taking medicine that contains denosumab  -thyroid or parathyroid disease  -an unusual reaction to denosumab, other medicines, foods, dyes, or preservatives  -pregnant or trying to get pregnant  -breast-feeding  How should I use this medicine?  This medicine is for injection under the skin. It is given by a health care professional in a hospital or clinic setting.  If you are getting Prolia, a special MedGuide will be given to you by the pharmacist with each prescription and refill. Be sure to read this information carefully each time.  For Prolia, talk to your pediatrician regarding the use of this medicine in children. Special care may be needed. For Xgeva, talk to your pediatrician regarding the use of this medicine in children. While this drug may be prescribed for children as young as 13 years for selected conditions, precautions do apply.  Overdosage: If you think you have taken too much of this medicine contact a poison control center or emergency room at once.  NOTE: This medicine is only for you. Do not share this medicine with others.  What if I miss a dose?  It is important not to miss your dose. Call your doctor or health care professional if you are  unable to keep an appointment.  What may interact with this medicine?  Do not take this medicine with any of the following medications:  -other medicines containing denosumab  This medicine may also interact with the following medications:  -medicines that suppress the immune system  -medicines that treat cancer  -steroid medicines like prednisone or cortisone  This list may not describe all possible interactions. Give your health care provider a list of all the medicines, herbs, non-prescription drugs, or dietary supplements you use. Also tell them if you smoke, drink alcohol, or use illegal drugs. Some items may interact with your medicine.  What should I watch for while using this medicine?  Visit your doctor or health care professional for regular checks on your progress. Your doctor or health care professional may order blood tests and other tests to see how you are doing.  Call your doctor or health care professional if you get a cold or other infection while receiving this medicine. Do not treat yourself. This medicine may decrease your body's ability to fight infection.  You should make sure you get enough calcium and vitamin D while you are taking this medicine, unless your doctor tells you not to. Discuss the foods you eat and the vitamins you take with your health care professional.  See your dentist regularly. Brush and floss your teeth as directed. Before you have any dental work done, tell your dentist you are receiving this medicine.  Do   not become pregnant while taking this medicine or for 5 months after stopping it. Women should inform their doctor if they wish to become pregnant or think they might be pregnant. There is a potential for serious side effects to an unborn child. Talk to your health care professional or pharmacist for more information.  What side effects may I notice from receiving this medicine?  Side effects that you should report to your doctor or health care professional as soon as  possible:  -allergic reactions like skin rash, itching or hives, swelling of the face, lips, or tongue  -breathing problems  -chest pain  -fast, irregular heartbeat  -feeling faint or lightheaded, falls  -fever, chills, or any other sign of infection  -muscle spasms, tightening, or twitches  -numbness or tingling  -skin blisters or bumps, or is dry, peels, or red  -slow healing or unexplained pain in the mouth or jaw  -unusual bleeding or bruising  Side effects that usually do not require medical attention (Report these to your doctor or health care professional if they continue or are bothersome.):  -muscle pain  -stomach upset, gas  This list may not describe all possible side effects. Call your doctor for medical advice about side effects. You may report side effects to FDA at 1-800-FDA-1088.  Where should I keep my medicine?  This medicine is only given in a clinic, doctor's office, or other health care setting and will not be stored at home.  NOTE: This sheet is a summary. It may not cover all possible information. If you have questions about this medicine, talk to your doctor, pharmacist, or health care provider.      2016, Elsevier/Gold Standard. (2011-07-02 12:37:47)

## 2017-04-10 ENCOUNTER — Ambulatory Visit
Admission: RE | Admit: 2017-04-10 | Discharge: 2017-04-10 | Disposition: A | Discharge status: Home / Self Care | Payer: Medicare Other | Source: Ambulatory Visit | Attending: Radiation Oncology | Admitting: Radiation Oncology

## 2017-04-10 ENCOUNTER — Encounter: Payer: Self-pay | Admitting: Radiation Oncology

## 2017-04-10 DIAGNOSIS — C342 Malignant neoplasm of middle lobe, bronchus or lung: Secondary | ICD-10-CM | POA: Diagnosis not present

## 2017-04-10 NOTE — Progress Notes (Signed)
   Radiation Oncology         [phone] ________________________________  Name: Deborah Foster MRN: [id number]  Date: 04/10/2017  DOB: 10-18-1970  End of Treatment Note  Diagnosis:   47 y.o. female with stage IV non-small cell lung cancer with enlarging left adrenal metastasis  Indication for treatment::  curative       Radiation treatment dates:   04/01/2017 - 04/10/2017  Site/dose:   Left Adrenal Gland / 40 Gy in 8 fractions  Beams/energy:   SBRT/SRT-3D // 6X-FFF Photon  Narrative: The patient tolerated radiation treatment relatively well and noted improvement in her left-sided abdominal pain. She experienced mild fatigue and some nausea. She denied any urinary, bowel, or skin issues related to radiation.  Plan: The patient has completed radiation treatment. The patient will return to radiation oncology clinic for routine followup in one month. I advised the patient to call or return sooner if they have any questions or concerns related to their recovery or treatment. ________________________________  Jodelle Gross, MD, PhD  This document serves as a record of services personally performed by Kyung Rudd, MD. It was created on his behalf by Rae Lips, a trained medical scribe. The creation of this record is based on the scribe's personal observations and the provider's statements to them. This document has been checked and approved by the attending provider.

## 2017-04-11 ENCOUNTER — Ambulatory Visit: Payer: Medicare Other | Admitting: Radiation Oncology

## 2017-04-12 ENCOUNTER — Encounter: Payer: Self-pay | Admitting: *Deleted

## 2017-04-12 NOTE — Progress Notes (Signed)
[place] Work  Clinical Social Work was referred by patient regarding financial concerns related to medical bills.  Deborah Foster brought a large pile of bills to [place] office and reported feeling high levels of anxiety because she does not understand how to communicate with billing office (English is not her primary language).  CSW met with patient and interpreter and sorted bills with patient.  CSW contacted billing office on speaker phone and assisted patient in conversation.    CSW assisted patient in applying for [employer] Assistance for one of her bills.  Also applied for [place] [facility], [employer], and [employer].     Deborah Foster, MSW, LCSW, OSW-C Clinical Social Worker [facility] [phone]

## 2017-04-15 ENCOUNTER — Ambulatory Visit: Payer: Medicare Other | Admitting: Radiation Oncology

## 2017-04-16 ENCOUNTER — Encounter: Payer: Self-pay | Admitting: Radiation Oncology

## 2017-04-17 ENCOUNTER — Ambulatory Visit: Payer: Medicare Other | Admitting: Radiation Oncology

## 2017-04-19 ENCOUNTER — Ambulatory Visit: Payer: Medicare Other | Admitting: Radiation Oncology

## 2017-05-01 ENCOUNTER — Other Ambulatory Visit: Payer: Self-pay | Admitting: Radiation Therapy

## 2017-05-01 DIAGNOSIS — C7931 Secondary malignant neoplasm of brain: Secondary | ICD-10-CM

## 2017-05-01 DIAGNOSIS — C7949 Secondary malignant neoplasm of other parts of nervous system: Principal | ICD-10-CM

## 2017-05-10 ENCOUNTER — Inpatient Hospital Stay: Payer: Medicare Other

## 2017-05-10 ENCOUNTER — Other Ambulatory Visit: Payer: Self-pay | Admitting: *Deleted

## 2017-05-10 ENCOUNTER — Encounter: Payer: Self-pay | Admitting: Oncology

## 2017-05-10 ENCOUNTER — Telehealth: Payer: Self-pay

## 2017-05-10 ENCOUNTER — Inpatient Hospital Stay: Payer: Medicare Other | Attending: Internal Medicine

## 2017-05-10 ENCOUNTER — Inpatient Hospital Stay (HOSPITAL_BASED_OUTPATIENT_CLINIC_OR_DEPARTMENT_OTHER): Payer: Medicare Other | Admitting: Oncology

## 2017-05-10 VITALS — BP 93/30 | HR 99 | Temp 97.7°F | Resp 17 | Ht 59.0 in | Wt 106.8 lb

## 2017-05-10 DIAGNOSIS — C7951 Secondary malignant neoplasm of bone: Secondary | ICD-10-CM

## 2017-05-10 DIAGNOSIS — R350 Frequency of micturition: Secondary | ICD-10-CM | POA: Diagnosis not present

## 2017-05-10 DIAGNOSIS — C7972 Secondary malignant neoplasm of left adrenal gland: Secondary | ICD-10-CM

## 2017-05-10 DIAGNOSIS — Z923 Personal history of irradiation: Secondary | ICD-10-CM | POA: Diagnosis not present

## 2017-05-10 DIAGNOSIS — C7931 Secondary malignant neoplasm of brain: Secondary | ICD-10-CM

## 2017-05-10 DIAGNOSIS — C3491 Malignant neoplasm of unspecified part of right bronchus or lung: Secondary | ICD-10-CM

## 2017-05-10 DIAGNOSIS — C342 Malignant neoplasm of middle lobe, bronchus or lung: Secondary | ICD-10-CM | POA: Insufficient documentation

## 2017-05-10 DIAGNOSIS — R093 Abnormal sputum: Secondary | ICD-10-CM | POA: Diagnosis not present

## 2017-05-10 DIAGNOSIS — C3431 Malignant neoplasm of lower lobe, right bronchus or lung: Secondary | ICD-10-CM

## 2017-05-10 DIAGNOSIS — M9080 Osteopathy in diseases classified elsewhere, unspecified site: Secondary | ICD-10-CM

## 2017-05-10 DIAGNOSIS — Z79899 Other long term (current) drug therapy: Secondary | ICD-10-CM | POA: Diagnosis not present

## 2017-05-10 DIAGNOSIS — Z9221 Personal history of antineoplastic chemotherapy: Secondary | ICD-10-CM | POA: Insufficient documentation

## 2017-05-10 DIAGNOSIS — Z5111 Encounter for antineoplastic chemotherapy: Secondary | ICD-10-CM

## 2017-05-10 DIAGNOSIS — R0789 Other chest pain: Secondary | ICD-10-CM | POA: Diagnosis not present

## 2017-05-10 DIAGNOSIS — R300 Dysuria: Secondary | ICD-10-CM

## 2017-05-10 DIAGNOSIS — M898X9 Other specified disorders of bone, unspecified site: Secondary | ICD-10-CM

## 2017-05-10 DIAGNOSIS — E889 Metabolic disorder, unspecified: Secondary | ICD-10-CM

## 2017-05-10 LAB — URINALYSIS, COMPLETE (UACMP) WITH MICROSCOPIC
BILIRUBIN URINE: NEGATIVE
Glucose, UA: NEGATIVE mg/dL
HGB URINE DIPSTICK: NEGATIVE
Ketones, ur: NEGATIVE mg/dL
Leukocytes, UA: NEGATIVE
Nitrite: NEGATIVE
PH: 7 (ref 5.0–8.0)
Protein, ur: NEGATIVE mg/dL
SPECIFIC GRAVITY, URINE: 1.002 — AB (ref 1.005–1.030)

## 2017-05-10 LAB — CMP (CANCER CENTER ONLY)
ALBUMIN: 3.9 g/dL (ref 3.5–5.0)
ALT: 20 U/L (ref 0–55)
AST: 19 U/L (ref 5–34)
Alkaline Phosphatase: 46 U/L (ref 40–150)
Anion gap: 8 (ref 3–11)
BUN: 9 mg/dL (ref 7–26)
CHLORIDE: 106 mmol/L (ref 98–109)
CO2: 25 mmol/L (ref 22–29)
Calcium: 9 mg/dL (ref 8.4–10.4)
Creatinine: 0.74 mg/dL (ref 0.60–1.10)
GFR, Est AFR Am: >60 mL/min (ref >60)
GLUCOSE: 80 mg/dL (ref 70–140)
Potassium: 4.2 mmol/L (ref 3.5–5.1)
Sodium: 139 mmol/L (ref 136–145)
Total Bilirubin: 0.4 mg/dL (ref 0.2–1.2)
Total Protein: 7 g/dL (ref 6.4–8.3)

## 2017-05-10 LAB — CBC WITH DIFFERENTIAL (CANCER CENTER ONLY)
Basophils Absolute: 0 10*3/uL (ref 0.0–0.1)
Basophils Relative: 1 %
EOS ABS: 0.1 10*3/uL (ref 0.0–0.5)
EOS PCT: 3 %
HCT: 42.4 % (ref 34.8–46.6)
Hemoglobin: 14.1 g/dL (ref 11.6–15.9)
LYMPHS ABS: 0.6 10*3/uL — AB (ref 0.9–3.3)
LYMPHS PCT: 18 %
MCH: 31.8 pg (ref 25.1–34.0)
MCHC: 33.3 g/dL (ref 31.5–36.0)
MCV: 95.6 fL (ref 79.5–101.0)
MONO ABS: 0.4 10*3/uL (ref 0.1–0.9)
MONOS PCT: 11 %
Neutro Abs: 2.3 10*3/uL (ref 1.5–6.5)
Neutrophils Relative %: 67 %
PLATELETS: 241 10*3/uL (ref 145–400)
RBC: 4.44 MIL/uL (ref 3.70–5.45)
RDW: 13.4 % (ref 11.2–14.5)
WBC: 3.3 10*3/uL — AB (ref 3.9–10.3)

## 2017-05-10 MED ORDER — DENOSUMAB 120 MG/1.7ML ~~LOC~~ SOLN
SUBCUTANEOUS | Status: AC
  Filled 2017-05-10: qty 1.7

## 2017-05-10 MED ORDER — DENOSUMAB 120 MG/1.7ML ~~LOC~~ SOLN
120.0000 mg | Freq: Once | SUBCUTANEOUS | Status: AC
  Administered 2017-05-10: 120 mg via SUBCUTANEOUS

## 2017-05-10 MED ORDER — OSIMERTINIB MESYLATE 80 MG PO TABS: 80.0000 mg | ORAL_TABLET | Freq: Every day | ORAL | 2 refills | Status: DC

## 2017-05-10 NOTE — Progress Notes (Signed)
Tagrisso Rx faxed to [facility]

## 2017-05-10 NOTE — Assessment & Plan Note (Addendum)
This is a very pleasant 47 year old Asian female with stage IV non-small cell lung cancer, adenocarcinoma with positive EGFR mutation with deletion in exon 48 diagnosed in July 2016 status post treatment with Tarceva for a total of 18 months but this was discontinued secondary to disease progression and development of T790M resistant mutation. The patient was started on treatment with Tagrisso 80 mg by mouth daily status post 11 months of treatment. She continues to tolerate this treatment fairly well with no concerning complaints.  I recommended for her to continue her treatment with Tagrisso. The patient will have a restaging CT scan of the chest, abdomen, pelvis prior to her next visit. She will come back for follow-up visit in 1 month for evaluation with repeat blood work.  For the metastatic bone disease, she will continue her current treatment with Xgeva.  For her increased frequency of urination, will check a urinalysis and urine culture.  She was advised to let us know if she has any recurrent vaginal bleeding.  May need to consider referral to gynecology.  For the thick mucus, I recommend that she use Mucinex twice a day and increase her fluid intake.  For her chest discomfort this is likely due to costochondritis.  Recommend that she tried Tylenol or ibuprofen as needed.  She was advised to seek treatment in the emergency room she develops any increase in her chest pain or shortness of breath.  She was advised to call immediately if she has any concerning symptoms in the interval. The patient voices understanding of current disease status and treatment options and is in agreement with the current care plan. All questions were answered. The patient knows to call the clinic with any problems, questions or concerns. We can certainly see the patient much sooner if necessary.

## 2017-05-10 NOTE — Progress Notes (Signed)
[name] OFFICE PROGRESS NOTE  [name], MD [place] [address]  DIAGNOSIS: Stage IV (T2a, N1, M1b) non-small cell lung cancer, adenocarcinoma with positive EGFR mutation (deletion 40) presented with right middle lobe lung mass, right hilar adenopathy as well as metastatic disease to the bone, brain and left adrenal diagnosed in July 2016.  PRIOR THERAPY: 1) palliative radiotherapy to the brain as well as metastatic bone lesions in the pelvis. 2) Tarceva 150 mg by mouth daily started on 09/25/2014. Status post 5 months of treatment. 3) status post palliative radiotherapy to the right middle lobe lung mass for treatment of hemoptysis. 4) Tarceva 100 mg by mouth daily started 02/19/2015 status post 13 months of treatment. This was discontinued in May 2018 after the patient had disease progression and development of T790M resistant mutation.   CURRENT THERAPY: 1) Tagrisso 80 mg by mouth daily started 06/02/2016.  Status post 11 months of treatment. 2) Xgeva 120 g subcutaneously on monthly basis.  INTERVAL HISTORY: Deborah Foster 47 y.o. female returns for a routine follow-up visit accompanied by an interpreter.  The patient is feeling fine today with exception of occasional chest pain in the center of her chest.  She also reports some difficulty swallowing due to thick mucus and that she is urinating more frequently without any symptoms of dysuria.  Since completing radiation she does report that she had 3 days of what appeared to be a light period.  She has had no recurrent bleeding.  Denies fevers and chills.  Denies shortness of breath, cough, hemoptysis.  Denies nausea, vomiting, constipation, diarrhea.  Denies skin rashes.  Patient is here for evaluation and repeat lab work.  MEDICAL HISTORY: Past Medical History:  Diagnosis Date  . Bone metastasis ([place])   . Hemoptysis   . Hypokalemia   . lung ca dx'd 07/2014  . Lung mass   . Metastasis  to adrenal gland ([place])   . Metastasis to brain ([place])   . Pneumonia   . Radiation 08/23/14-09/07/14   Brain/chest and left hip 30 Gy 12 Fx  . URI (upper respiratory infection) 02-02-2015    ALLERGIES:  is allergic to other and doxycycline.  MEDICATIONS:  Current Outpatient Medications  Medication Sig Dispense Refill  . AMBULATORY NON FORMULARY MEDICATION Medication Name: MAGIC MOUTHWASH 2 % Viscous Lidocaine  Maalox Benadryl Susp.  Disp: 1:1:1 287m bottle Sig: 124mPO Swish & Spit  q 3-4hrs. PRN mouth sores 200 mL 3  . benzonatate (TESSALON) 100 MG capsule Take 1 capsule (100 mg total) by mouth every 8 (eight) hours as needed. 20 capsule 0  . Calcium Carbonate-Vitamin D (CALCIUM 500 + D) 500-125 MG-UNIT TABS Take 500 mg by mouth 2 (two) times daily.    . chlorpheniramine-HYDROcodone (TUSSIONEX) 10-8 MG/5ML SUER Take 5 mLs by mouth every 12 (twelve) hours as needed. 140 mL 0  . clindamycin (CLINDAGEL) 1 % gel Apply 1 application topically 2 (two) times daily.    . Cyanocobalamin (VITAMIN B-12) 2500 MCG SUBL Place 2,500 mcg under the tongue daily.    . Marland Kitchenextromethorphan Polistirex (DELSYM PO) Take by mouth.    . ENSURE (ENSURE) Take 1 Can by mouth 2 (two) times daily between meals.     . ferrous sulfate 325 (65 FE) MG EC tablet Take 325 mg by mouth daily with breakfast.    . fluticasone (VERAMYST) 27.5 MCG/SPRAY nasal spray Place 2 sprays into the nose daily.    . folic  acid (FOLVITE) 800 MCG tablet Take 1 tablet (800 mcg total) by mouth daily. 30 tablet 1  . loratadine (CLARITIN) 10 MG tablet Take 1 tablet (10 mg total) by mouth daily. 30 tablet 0  . Nutritional Supplements (JUICE PLUS FIBRE PO) Take 1 capsule by mouth daily. Reported on 07/28/2015    . osimertinib mesylate (TAGRISSO) 80 MG tablet Take 1 tablet (80 mg total) by mouth daily. 30 tablet 2  . OVER THE COUNTER MEDICATION "Deep penetrating pain relief oil"    . prochlorperazine (COMPAZINE) 10 MG tablet Take 1 tablet (10 mg  total) by mouth every 6 (six) hours as needed for nausea or vomiting. 30 tablet 0  . ranitidine (ZANTAC) 300 MG tablet Take 1 tablet (300 mg total) by mouth at bedtime. 30 tablet 6  . TURMERIC PO Take 1 capsule by mouth 2 (two) times daily. Turmeric Superior 750 mg po bid     No current facility-administered medications for this visit.     SURGICAL HISTORY:  Past Surgical History:  Procedure Laterality Date  . IR GENERIC HISTORICAL  07/14/2015   IR RADIOLOGIST EVAL & MGMT 07/14/2015 Greggory Keen, MD [facility] INTERV RAD  . VIDEO BRONCHOSCOPY Bilateral 08/09/2014   Procedure: VIDEO BRONCHOSCOPY WITHOUT FLUORO;  Surgeon: [name], MD;  Location: [facility] ENDOSCOPY;  Service: Cardiopulmonary;  Laterality: Bilateral;  . VIDEO BRONCHOSCOPY Bilateral 07/06/2015   Procedure: VIDEO BRONCHOSCOPY WITHOUT FLUORO;  Surgeon: [name], MD;  Location: [facility] ENDOSCOPY;  Service: Cardiopulmonary;  Laterality: Bilateral;    REVIEW OF SYSTEMS:   Review of Systems  Constitutional: Negative for appetite change, chills, fatigue, fever and unexpected weight change.  HENT:   Negative for mouth sores, nosebleeds, sore throat and trouble swallowing.   Eyes: Negative for eye problems and icterus.  Respiratory: Negative for cough, hemoptysis, shortness of breath and wheezing.   Cardiovascular: Negative for leg swelling. Positive for pain in the center of her chest that comes and goes. Gastrointestinal: Negative for abdominal pain, constipation, diarrhea, nausea and vomiting.  Genitourinary: Negative for bladder incontinence, difficulty urinating, dysuria, and hematuria.  Ports increased frequency of urination. Musculoskeletal: Negative for back pain, gait problem, neck pain and neck stiffness.  Skin: Negative for itching and rash.  Neurological: Negative for dizziness, extremity weakness, gait problem, headaches, light-headedness and seizures.  Hematological: Negative for adenopathy. Does not bruise/bleed  easily.  Psychiatric/Behavioral: Negative for confusion, depression and sleep disturbance. The patient is not nervous/anxious.     PHYSICAL EXAMINATION:  Blood pressure (!) 93/30, pulse 99, temperature 97.7 F (36.5 C), temperature source Oral, resp. rate 17, height '4\' 11"'$  (1.499 m), weight 106 lb 12.8 oz (48.4 kg), SpO2 100 %.  ECOG PERFORMANCE STATUS: 1 - Symptomatic but completely ambulatory  Physical Exam  Constitutional: Oriented to person, place, and time and well-developed, well-nourished, and in no distress. No distress.  HENT:  Head: Normocephalic and atraumatic.  Mouth/Throat: Oropharynx is clear and moist. No oropharyngeal exudate.  Eyes: Conjunctivae are normal. Right eye exhibits no discharge. Left eye exhibits no discharge. No scleral icterus.  Neck: Normal range of motion. Neck supple.  Cardiovascular: Normal rate, regular rhythm, normal heart sounds and intact distal pulses.  Reproducible pain with palpation to the center of her chest. Pulmonary/Chest: Effort normal and breath sounds normal. No respiratory distress. No wheezes. No rales.  Abdominal: Soft. Bowel sounds are normal. Exhibits no distension and no mass. There is no tenderness.  Musculoskeletal: Normal range of motion. Exhibits no edema.  Lymphadenopathy:  No cervical adenopathy.  Neurological: Alert and oriented to person, place, and time. Exhibits normal muscle tone. Gait normal. Coordination normal.  Skin: Skin is warm and dry. No rash noted. Not diaphoretic. No erythema. No pallor.  Psychiatric: Mood, memory and judgment normal.  Vitals reviewed.  LABORATORY DATA: Lab Results  Component Value Date   WBC 3.3 (L) 05/10/2017   HGB 14.1 05/10/2017   HCT 42.4 05/10/2017   MCV 95.6 05/10/2017   PLT 241 05/10/2017      Chemistry      Component Value Date/Time   NA 139 05/10/2017 0751   NA 140 01/17/2017 1003   K 4.2 05/10/2017 0751   K 3.9 01/17/2017 1003   CL 106 05/10/2017 0751   CO2 25  05/10/2017 0751   CO2 29 01/17/2017 1003   BUN 9 05/10/2017 0751   BUN 14.8 01/17/2017 1003   CREATININE 0.74 05/10/2017 0751   CREATININE 0.8 01/17/2017 1003      Component Value Date/Time   CALCIUM 9.0 05/10/2017 0751   CALCIUM 8.7 01/17/2017 1003   ALKPHOS 46 05/10/2017 0751   ALKPHOS 50 01/17/2017 1003   AST 19 05/10/2017 0751   AST 23 01/17/2017 1003   ALT 20 05/10/2017 0751   ALT 27 01/17/2017 1003   BILITOT 0.4 05/10/2017 0751   BILITOT 0.46 01/17/2017 1003       RADIOGRAPHIC STUDIES:  No results found.   ASSESSMENT/PLAN:  Primary lung adenocarcinoma [facility]) This is a very pleasant 47 year old Asian female with stage IV non-small cell lung cancer, adenocarcinoma with positive EGFR mutation with deletion in exon 19 diagnosed in July 2016 status post treatment with Tarceva for a total of 18 months but this was discontinued secondary to disease progression and development of T790M resistant mutation. The patient was started on treatment with Tagrisso 80 mg by mouth daily status post 11 months of treatment. She continues to tolerate this treatment fairly well with no concerning complaints.  I recommended for her to continue her treatment with Tagrisso. The patient will have a restaging CT scan of the chest, abdomen, pelvis prior to her next visit. She will come back for follow-up visit in 1 month for evaluation with repeat blood work.  For the metastatic bone disease, she will continue her current treatment with Xgeva.  For her increased frequency of urination, will check a urinalysis and urine culture.  She was advised to let us know if she has any recurrent vaginal bleeding.  May need to consider referral to gynecology.  For the thick mucus, I recommend that she use Mucinex twice a day and increase her fluid intake.  For her chest discomfort this is likely due to costochondritis.  Recommend that she tried Tylenol or ibuprofen as needed.  She was advised to seek treatment  in the emergency room she develops any increase in her chest pain or shortness of breath.  She was advised to call immediately if she has any concerning symptoms in the interval. The patient voices understanding of current disease status and treatment options and is in agreement with the current care plan. All questions were answered. The patient knows to call the clinic with any problems, questions or concerns. We can certainly see the patient much sooner if necessary.   Orders Placed This Encounter  Procedures  . Urine Culture    Standing Status:   Future    Number of Occurrences:   1    Standing Expiration Date:   05/11/2018  .  CT ABDOMEN PELVIS W CONTRAST    Standing Status:   Future    Standing Expiration Date:   05/11/2018    Order Specific Question:   If indicated for the ordered procedure, I authorize the administration of contrast media per Radiology protocol    Answer:   Yes    Order Specific Question:   Preferred imaging location?    Answer:   [facility]    Order Specific Question:   Radiology Contrast Protocol - do NOT remove file path    Answer:   \\[email or web address]    Order Specific Question:   Reason for Exam additional comments    Answer:   stage IV lung cancer. Restaging    Order Specific Question:   Is patient pregnant?    Answer:   No  . CT CHEST W CONTRAST    Standing Status:   Future    Standing Expiration Date:   05/11/2018    Order Specific Question:   If indicated for the ordered procedure, I authorize the administration of contrast media per Radiology protocol    Answer:   Yes    Order Specific Question:   Preferred imaging location?    Answer:   [facility]    Order Specific Question:   Radiology Contrast Protocol - do NOT remove file path    Answer:   \\[email or web address]    Order Specific Question:   Reason for Exam additional comments    Answer:   stage IV lung cancer. Restaging.     Order Specific Question:   Is patient pregnant?    Answer:   No  . Urinalysis, Complete w Microscopic    Standing Status:   Future    Number of Occurrences:   1    Standing Expiration Date:   05/11/2018   Mikey Bussing, DNP, AGPCNP-BC, AOCNP 05/10/17

## 2017-05-10 NOTE — Patient Instructions (Signed)
Denosumab injection  What is this medicine?  DENOSUMAB (den oh sue mab) slows bone breakdown. Prolia is used to treat osteoporosis in women after menopause and in men. Xgeva is used to prevent bone fractures and other bone problems caused by cancer bone metastases. Xgeva is also used to treat giant cell tumor of the bone.  This medicine may be used for other purposes; ask your health care provider or pharmacist if you have questions.  What should I tell my health care provider before I take this medicine?  They need to know if you have any of these conditions:  -dental disease  -eczema  -infection or history of infections  -kidney disease or on dialysis  -low blood calcium or vitamin D  -malabsorption syndrome  -scheduled to have surgery or tooth extraction  -taking medicine that contains denosumab  -thyroid or parathyroid disease  -an unusual reaction to denosumab, other medicines, foods, dyes, or preservatives  -pregnant or trying to get pregnant  -breast-feeding  How should I use this medicine?  This medicine is for injection under the skin. It is given by a health care professional in a hospital or clinic setting.  If you are getting Prolia, a special MedGuide will be given to you by the pharmacist with each prescription and refill. Be sure to read this information carefully each time.  For Prolia, talk to your pediatrician regarding the use of this medicine in children. Special care may be needed. For Xgeva, talk to your pediatrician regarding the use of this medicine in children. While this drug may be prescribed for children as young as 13 years for selected conditions, precautions do apply.  Overdosage: If you think you have taken too much of this medicine contact a poison control center or emergency room at once.  NOTE: This medicine is only for you. Do not share this medicine with others.  What if I miss a dose?  It is important not to miss your dose. Call your doctor or health care professional if you are  unable to keep an appointment.  What may interact with this medicine?  Do not take this medicine with any of the following medications:  -other medicines containing denosumab  This medicine may also interact with the following medications:  -medicines that suppress the immune system  -medicines that treat cancer  -steroid medicines like prednisone or cortisone  This list may not describe all possible interactions. Give your health care provider a list of all the medicines, herbs, non-prescription drugs, or dietary supplements you use. Also tell them if you smoke, drink alcohol, or use illegal drugs. Some items may interact with your medicine.  What should I watch for while using this medicine?  Visit your doctor or health care professional for regular checks on your progress. Your doctor or health care professional may order blood tests and other tests to see how you are doing.  Call your doctor or health care professional if you get a cold or other infection while receiving this medicine. Do not treat yourself. This medicine may decrease your body's ability to fight infection.  You should make sure you get enough calcium and vitamin D while you are taking this medicine, unless your doctor tells you not to. Discuss the foods you eat and the vitamins you take with your health care professional.  See your dentist regularly. Brush and floss your teeth as directed. Before you have any dental work done, tell your dentist you are receiving this medicine.  Do   not become pregnant while taking this medicine or for 5 months after stopping it. Women should inform their doctor if they wish to become pregnant or think they might be pregnant. There is a potential for serious side effects to an unborn child. Talk to your health care professional or pharmacist for more information.  What side effects may I notice from receiving this medicine?  Side effects that you should report to your doctor or health care professional as soon as  possible:  -allergic reactions like skin rash, itching or hives, swelling of the face, lips, or tongue  -breathing problems  -chest pain  -fast, irregular heartbeat  -feeling faint or lightheaded, falls  -fever, chills, or any other sign of infection  -muscle spasms, tightening, or twitches  -numbness or tingling  -skin blisters or bumps, or is dry, peels, or red  -slow healing or unexplained pain in the mouth or jaw  -unusual bleeding or bruising  Side effects that usually do not require medical attention (Report these to your doctor or health care professional if they continue or are bothersome.):  -muscle pain  -stomach upset, gas  This list may not describe all possible side effects. Call your doctor for medical advice about side effects. You may report side effects to FDA at 1-800-FDA-1088.  Where should I keep my medicine?  This medicine is only given in a clinic, doctor's office, or other health care setting and will not be stored at home.  NOTE: This sheet is a summary. It may not cover all possible information. If you have questions about this medicine, talk to your doctor, pharmacist, or health care provider.      2016, Elsevier/Gold Standard. (2011-07-02 12:37:47)

## 2017-05-10 NOTE — Telephone Encounter (Signed)
Printed avs and calender of upcoming appointment. Per 4/26 los

## 2017-05-11 LAB — URINE CULTURE

## 2017-05-13 NOTE — Progress Notes (Signed)
Radiation Oncology         [phone] ________________________________  Name: Deborah Foster MRN: [id number]  Date of Service: 05/14/2017 DOB: 29-Mar-1970  Post Treatment Note  CC: Deborah Downing, MD  Deborah Bears, MD  Diagnosis:  Stage IV T2a, N1, M1b NSCLC, adenocarcinoma of the right middle lobe    Interval Since Last Radiation:  5 weeks   04/01/2017 - 04/10/2017: Left Adrenal Gland / 40 Gy in 5 fractions  07/21/2015 to 08/03/2015:  The Right hilar tumor was treated to 30 Gy in 10 fractions at 3 Gy per fraction.   08/23/14-09/07/14:  30 Gy in 12 fractions was prescribed to the right lung, whole brain, and left hip.   Narrative:  The patient returns today for routine follow-up.  She tolerated radiotherapy well without untoward side effects.                           On review of systems, the patient states she is doing okay. She denies any headaches. She reports that intermittently she's noted some abdominal pain on the left side in the lower quadrant, particularly if she pulls her leg to her chest when seated. She denies any constipation, fevers, chills, diarrhea, or emesis. She has had some nausea intermittently related to her Tagrisso but denies any progressive symptoms. She denies feeling any fullness externally along the abdominal wall, but feels more of a deep aching or gnawing pain. She denies any dyspepsia or belching. No other complaints are verbalized.   ALLERGIES:  is allergic to other and doxycycline.  Meds: Current Outpatient Medications  Medication Sig Dispense Refill  . AMBULATORY NON FORMULARY MEDICATION Medication Name: MAGIC MOUTHWASH 2 % Viscous Lidocaine  Maalox Benadryl Susp.  Disp: 1:1:1 216ml bottle Sig: 90ml PO Swish & Spit  q 3-4hrs. PRN mouth sores 200 mL 3  . benzonatate (TESSALON) 100 MG capsule Take 1 capsule (100 mg total) by mouth every 8 (eight) hours as needed. 20 capsule 0  . Calcium Carbonate-Vitamin D (CALCIUM 500 + D) 500-125  MG-UNIT TABS Take 500 mg by mouth 2 (two) times daily.    . chlorpheniramine-HYDROcodone (TUSSIONEX) 10-8 MG/5ML SUER Take 5 mLs by mouth every 12 (twelve) hours as needed. 140 mL 0  . clindamycin (CLINDAGEL) 1 % gel Apply 1 application topically 2 (two) times daily.    . Cyanocobalamin (VITAMIN B-12) 2500 MCG SUBL Place 2,500 mcg under the tongue daily.    Marland Kitchen Dextromethorphan Polistirex (DELSYM PO) Take by mouth.    . ENSURE (ENSURE) Take 1 Can by mouth 2 (two) times daily between meals.     . ferrous sulfate 325 (65 FE) MG EC tablet Take 325 mg by mouth daily with breakfast.    . fluticasone (VERAMYST) 27.5 MCG/SPRAY nasal spray Place 2 sprays into the nose daily.    . folic acid (FOLVITE) 297 MCG tablet Take 1 tablet (800 mcg total) by mouth daily. 30 tablet 1  . loratadine (CLARITIN) 10 MG tablet Take 1 tablet (10 mg total) by mouth daily. 30 tablet 0  . Nutritional Supplements (JUICE PLUS FIBRE PO) Take 1 capsule by mouth daily. Reported on 07/28/2015    . osimertinib mesylate (TAGRISSO) 80 MG tablet Take 1 tablet (80 mg total) by mouth daily. 30 tablet 2  . OVER THE COUNTER MEDICATION "Deep penetrating pain relief oil"    . ranitidine (ZANTAC) 300 MG tablet Take 1 tablet (300 mg total) by mouth at  bedtime. 30 tablet 6  . TURMERIC PO Take 1 capsule by mouth 2 (two) times daily. Turmeric Superior 750 mg po bid    . prochlorperazine (COMPAZINE) 10 MG tablet Take 1 tablet (10 mg total) by mouth every 6 (six) hours as needed for nausea or vomiting. (Patient not taking: Reported on 05/14/2017) 30 tablet 0   No current facility-administered medications for this encounter.     Physical Findings:  height is 4\' 11"  (1.499 m) and weight is 105 lb 12.8 oz (48 kg). Her oral temperature is 97.7 F (36.5 C). Her blood pressure is 116/69 and her pulse is 73. Her respiration is 20 and oxygen saturation is 98%.  Pain Assessment Pain Score: 3  Pain Loc: Abdomen/10 In general this is a well appearing Asian  female in no acute distress. She's alert and oriented x4 and appropriate throughout the examination. Cardiopulmonary assessment is negative for acute distress and she exhibits normal effort.  Abdomen has active bowel sounds in all quadrants and is intact. The abdomen is soft, non tender, non distended, no rebound or guarding is noted. No palpable hernia is noted. Lower extremities are negative for pretibial pitting edema, deep calf tenderness, cyanosis or clubbing.   Lab Findings: Lab Results  Component Value Date   WBC 3.3 (L) 05/10/2017   HGB 14.1 05/10/2017   HCT 42.4 05/10/2017   MCV 95.6 05/10/2017   PLT 241 05/10/2017     Radiographic Findings: No results found.  Impression/Plan: 1. Stage IV T2a, N1, M1b NSCLC, adenocarcinoma of the right middle lobe. The patient appears to be doing well since completion of radiotherapy to the adrenal gland. She will continue Tagrisso and follow up with CT imaging later in May, and see Dr. Julien Nordmann on 06/12/17. We will follow this expectantly and she will be due for her next surveillance Brain MRI in July. I'll see her back after this has been performed to review recommendations after the scans are reviewed in brain oncology conference.  2. LLQ pain. The patient has intermittent discomfort as above. I encouraged her to follow up with Dr. Julien Nordmann if this progresses or has more specific onset or pattern. She will keep [place] informed as well but it does not appear to be related to radiotherapy treatment.      Carola Rhine, PAC

## 2017-05-14 ENCOUNTER — Other Ambulatory Visit: Payer: Self-pay

## 2017-05-14 ENCOUNTER — Ambulatory Visit
Admission: RE | Admit: 2017-05-14 | Discharge: 2017-05-14 | Disposition: A | Discharge status: Home / Self Care | Payer: Medicare Other | Source: Ambulatory Visit | Attending: Radiation Oncology | Admitting: Radiation Oncology

## 2017-05-14 ENCOUNTER — Encounter: Payer: Self-pay | Admitting: Radiation Oncology

## 2017-05-14 VITALS — BP 116/69 | HR 73 | Temp 97.7°F | Resp 20 | Ht 59.0 in | Wt 105.8 lb

## 2017-05-14 DIAGNOSIS — C342 Malignant neoplasm of middle lobe, bronchus or lung: Secondary | ICD-10-CM | POA: Insufficient documentation

## 2017-05-14 DIAGNOSIS — C7931 Secondary malignant neoplasm of brain: Secondary | ICD-10-CM

## 2017-05-14 DIAGNOSIS — Z510 Encounter for antineoplastic radiation therapy: Secondary | ICD-10-CM | POA: Diagnosis not present

## 2017-05-14 DIAGNOSIS — R1032 Left lower quadrant pain: Secondary | ICD-10-CM | POA: Diagnosis not present

## 2017-05-14 DIAGNOSIS — Z79899 Other long term (current) drug therapy: Secondary | ICD-10-CM | POA: Diagnosis not present

## 2017-05-14 DIAGNOSIS — C7949 Secondary malignant neoplasm of other parts of nervous system: Secondary | ICD-10-CM

## 2017-05-14 DIAGNOSIS — C7972 Secondary malignant neoplasm of left adrenal gland: Secondary | ICD-10-CM

## 2017-05-14 DIAGNOSIS — C3491 Malignant neoplasm of unspecified part of right bronchus or lung: Secondary | ICD-10-CM

## 2017-05-14 NOTE — Addendum Note (Signed)
Encounter addended by: Malena Edman, RN on: 05/14/2017 11:16 AM  Actions taken: Charge Capture section accepted

## 2017-05-20 DIAGNOSIS — R630 Anorexia: Secondary | ICD-10-CM | POA: Diagnosis not present

## 2017-05-20 DIAGNOSIS — J069 Acute upper respiratory infection, unspecified: Secondary | ICD-10-CM | POA: Diagnosis not present

## 2017-05-21 ENCOUNTER — Telehealth: Payer: Self-pay | Admitting: *Deleted

## 2017-05-21 DIAGNOSIS — Z5111 Encounter for antineoplastic chemotherapy: Secondary | ICD-10-CM

## 2017-05-21 DIAGNOSIS — C7972 Secondary malignant neoplasm of left adrenal gland: Secondary | ICD-10-CM

## 2017-05-21 DIAGNOSIS — C7951 Secondary malignant neoplasm of bone: Secondary | ICD-10-CM

## 2017-05-21 DIAGNOSIS — C7931 Secondary malignant neoplasm of brain: Secondary | ICD-10-CM

## 2017-05-21 DIAGNOSIS — C3491 Malignant neoplasm of unspecified part of right bronchus or lung: Secondary | ICD-10-CM

## 2017-05-21 MED ORDER — OSIMERTINIB MESYLATE 80 MG PO TABS: 80.0000 mg | ORAL_TABLET | Freq: Every day | ORAL | 2 refills | Status: DC

## 2017-05-21 NOTE — Telephone Encounter (Signed)
Pt here at Nurses desk asking for Rx refill on [place] for [place] called into pt's Pharmacy. Automated system does not allow Medication to be refilled over the phone. WIll fax new Rx to [facility]. [phone] Last Rx refill faxed was signed by NP, [facility] will not accept. Rx must be from [facility] Pt notified.  Marland Kitchen

## 2017-05-29 NOTE — Progress Notes (Signed)
  Radiation Oncology         (336) [phone] ________________________________  Name: Deborah Foster MRN: [id number]  Date: 03/22/2017  DOB: 1970-09-09  RESPIRATORY MOTION MANAGEMENT SIMULATION  NARRATIVE:  In order to account for effect of respiratory motion on target structures and other organs in the planning and delivery of radiotherapy, this patient underwent respiratory motion management simulation.  To accomplish this, when the patient was brought to the CT simulation planning suite, 4D respiratoy motion management CT images were obtained.  The CT images were loaded into the planning software.  Then, using a variety of tools including Cine, MIP, and standard views, the target volume and planning target volumes (PTV) were delineated.  Avoidance structures were contoured.  Treatment planning then occurred.  Dose volume histograms were generated and reviewed for each of the requested structure.  The resulting plan was carefully reviewed and approved today.   ------------------------------------------------  Jodelle Gross, MD, PhD

## 2017-05-29 NOTE — Progress Notes (Signed)
  Radiation Oncology         (336) [phone] ________________________________  Name: Deborah Foster MRN: [id number]  Date: 03/22/2017  DOB: 02-03-1970  SIMULATION AND TREATMENT PLANNING NOTE  DIAGNOSIS:     ICD-10-CM   1. Malignant neoplasm metastatic to left adrenal gland [facility]) C79.72      Site: Left adrenal gland  NARRATIVE:  The patient was brought to the [facility].  Identity was confirmed.  All relevant records and images related to the planned course of therapy were reviewed.   Written consent to proceed with treatment was confirmed which was freely given after reviewing the details related to the planned course of therapy had been reviewed with the patient.  Then, the patient was set-up in a stable reproducible  supine position for radiation therapy.  CT images were obtained.  Surface markings were placed.    Medically necessary complex treatment device(s) for immobilization: Customized Vac-Lok bag, customized Accu form device.   The CT images were loaded into the planning software.  Then the target and avoidance structures were contoured.  Treatment planning then occurred.  The radiation prescription was entered and confirmed.  I have requested : Intensity Modulated Radiotherapy (IMRT) is medically necessary for this case for the following reason: Avoidance of critical adjacent nearby structures including the left kidney, bowel, and spinal cord.   PLAN:  The patient will receive 40 Gy in 8 fractions.  ________________________________   Jodelle Gross, MD, PhD

## 2017-06-04 ENCOUNTER — Other Ambulatory Visit: Payer: Self-pay | Admitting: Internal Medicine

## 2017-06-07 ENCOUNTER — Ambulatory Visit (HOSPITAL_COMMUNITY)
Admission: RE | Admit: 2017-06-07 | Discharge: 2017-06-07 | Disposition: A | Discharge status: Home / Self Care | Payer: Medicare Other | Source: Ambulatory Visit | Attending: Oncology | Admitting: Oncology

## 2017-06-07 ENCOUNTER — Encounter (HOSPITAL_COMMUNITY): Payer: Self-pay

## 2017-06-07 DIAGNOSIS — C7972 Secondary malignant neoplasm of left adrenal gland: Secondary | ICD-10-CM | POA: Diagnosis not present

## 2017-06-07 DIAGNOSIS — C7951 Secondary malignant neoplasm of bone: Secondary | ICD-10-CM | POA: Diagnosis not present

## 2017-06-07 DIAGNOSIS — C3491 Malignant neoplasm of unspecified part of right bronchus or lung: Secondary | ICD-10-CM

## 2017-06-07 MED ORDER — IOPAMIDOL (ISOVUE-300) INJECTION 61%
100.0000 mL | Freq: Once | INTRAVENOUS | Status: AC | PRN
  Administered 2017-06-07: 100 mL via INTRAVENOUS

## 2017-06-07 MED ORDER — IOPAMIDOL (ISOVUE-300) INJECTION 61%
INTRAVENOUS | Status: AC
  Filled 2017-06-07: qty 100

## 2017-06-12 ENCOUNTER — Inpatient Hospital Stay: Payer: Medicare Other | Attending: Internal Medicine | Admitting: Internal Medicine

## 2017-06-12 ENCOUNTER — Telehealth: Payer: Self-pay

## 2017-06-12 ENCOUNTER — Inpatient Hospital Stay: Payer: Medicare Other

## 2017-06-12 ENCOUNTER — Encounter: Payer: Self-pay | Admitting: Internal Medicine

## 2017-06-12 VITALS — BP 99/57 | HR 103 | Temp 98.0°F | Resp 18 | Ht 59.0 in | Wt 106.0 lb

## 2017-06-12 DIAGNOSIS — Z79899 Other long term (current) drug therapy: Secondary | ICD-10-CM | POA: Diagnosis not present

## 2017-06-12 DIAGNOSIS — M9080 Osteopathy in diseases classified elsewhere, unspecified site: Secondary | ICD-10-CM

## 2017-06-12 DIAGNOSIS — R05 Cough: Secondary | ICD-10-CM | POA: Insufficient documentation

## 2017-06-12 DIAGNOSIS — Z5111 Encounter for antineoplastic chemotherapy: Secondary | ICD-10-CM

## 2017-06-12 DIAGNOSIS — C342 Malignant neoplasm of middle lobe, bronchus or lung: Secondary | ICD-10-CM

## 2017-06-12 DIAGNOSIS — C3491 Malignant neoplasm of unspecified part of right bronchus or lung: Secondary | ICD-10-CM

## 2017-06-12 DIAGNOSIS — C3431 Malignant neoplasm of lower lobe, right bronchus or lung: Secondary | ICD-10-CM

## 2017-06-12 DIAGNOSIS — C7972 Secondary malignant neoplasm of left adrenal gland: Secondary | ICD-10-CM | POA: Diagnosis not present

## 2017-06-12 DIAGNOSIS — C7951 Secondary malignant neoplasm of bone: Secondary | ICD-10-CM | POA: Diagnosis not present

## 2017-06-12 DIAGNOSIS — Z923 Personal history of irradiation: Secondary | ICD-10-CM | POA: Diagnosis not present

## 2017-06-12 DIAGNOSIS — E889 Metabolic disorder, unspecified: Secondary | ICD-10-CM

## 2017-06-12 DIAGNOSIS — C7931 Secondary malignant neoplasm of brain: Secondary | ICD-10-CM | POA: Diagnosis not present

## 2017-06-12 DIAGNOSIS — M898X9 Other specified disorders of bone, unspecified site: Secondary | ICD-10-CM

## 2017-06-12 DIAGNOSIS — Z9221 Personal history of antineoplastic chemotherapy: Secondary | ICD-10-CM | POA: Insufficient documentation

## 2017-06-12 LAB — COMPREHENSIVE METABOLIC PANEL
ALT: 29 U/L (ref 0–55)
AST: 26 U/L (ref 5–34)
Albumin: 4 g/dL (ref 3.5–5.0)
Alkaline Phosphatase: 46 U/L (ref 40–150)
Anion gap: 9 (ref 3–11)
BILIRUBIN TOTAL: 0.5 mg/dL (ref 0.2–1.2)
BUN: 13 mg/dL (ref 7–26)
CO2: 26 mmol/L (ref 22–29)
CREATININE: 0.73 mg/dL (ref 0.60–1.10)
Calcium: 9.1 mg/dL (ref 8.4–10.4)
Chloride: 104 mmol/L (ref 98–109)
GFR calc Af Amer: >60 mL/min (ref >60)
GFR calc non Af Amer: >60 mL/min (ref >60)
Glucose, Bld: 92 mg/dL (ref 70–140)
POTASSIUM: 4 mmol/L (ref 3.5–5.1)
Sodium: 139 mmol/L (ref 136–145)
TOTAL PROTEIN: 7.2 g/dL (ref 6.4–8.3)

## 2017-06-12 LAB — CBC WITH DIFFERENTIAL/PLATELET
BASOS ABS: 0 10*3/uL (ref 0.0–0.1)
Basophils Relative: 0 %
EOS ABS: 0.1 10*3/uL (ref 0.0–0.5)
EOS PCT: 2 %
HCT: 41.1 % (ref 34.8–46.6)
Hemoglobin: 13.4 g/dL (ref 11.6–15.9)
Lymphocytes Relative: 20 %
Lymphs Abs: 0.9 10*3/uL (ref 0.9–3.3)
MCH: 31.6 pg (ref 25.1–34.0)
MCHC: 32.6 g/dL (ref 31.5–36.0)
MCV: 96.9 fL (ref 79.5–101.0)
Monocytes Absolute: 0.4 10*3/uL (ref 0.1–0.9)
Monocytes Relative: 8 %
Neutro Abs: 3 10*3/uL (ref 1.5–6.5)
Neutrophils Relative %: 70 %
PLATELETS: 257 10*3/uL (ref 145–400)
RBC: 4.24 MIL/uL (ref 3.70–5.45)
RDW: 13.2 % (ref 11.2–14.5)
WBC: 4.3 10*3/uL (ref 3.9–10.3)

## 2017-06-12 MED ORDER — DENOSUMAB 120 MG/1.7ML ~~LOC~~ SOLN
SUBCUTANEOUS | Status: AC
  Filled 2017-06-12: qty 1.7

## 2017-06-12 MED ORDER — DENOSUMAB 120 MG/1.7ML ~~LOC~~ SOLN
120.0000 mg | Freq: Once | SUBCUTANEOUS | Status: AC
  Administered 2017-06-12: 120 mg via SUBCUTANEOUS

## 2017-06-12 NOTE — Telephone Encounter (Signed)
Printed avs and calender of upcoming appointment. Per 5/29 los every 6 weeks

## 2017-06-12 NOTE — Progress Notes (Signed)
[place] Telephone:([phone]) [phone]   [phone]  OFFICE PROGRESS NOTE  [name], MD [place] [address]  DIAGNOSIS: Stage IV (T2a, N1, M1b) non-small cell lung cancer, adenocarcinoma with positive EGFR mutation (deletion 74) presented with right middle lobe lung mass, right hilar adenopathy as well as metastatic disease to the bone, brain and left adrenal diagnosed in July 2016.  PRIOR THERAPY: 1) palliative radiotherapy to the brain as well as metastatic bone lesions in the pelvis. 2) Tarceva 150 mg by mouth daily started on 09/25/2014. Status post 5 months of treatment. 3) status post palliative radiotherapy to the right middle lobe lung mass for treatment of hemoptysis. 4) Tarceva 100 mg by mouth daily started 02/19/2015 status post 13 months of treatment. This was discontinued in May 2018 after the patient had disease progression and development of T790M resistant mutation.  5) palliative stereotactic radiotherapy to the left adrenal gland metastatic lesion under the care of Dr. Lisbeth Renshaw completed on April 10, 2017.  CURRENT THERAPY: 1) Tagrisso 80 mg by mouth daily started 06/02/2016.  Status post 12 months of treatment. 2) Xgeva 120 g subcutaneously on monthly basis.  INTERVAL HISTORY: Deborah Foster 47 y.o. female returns to the clinic today for follow-up visit accompanied by her [place] interpreter.  The patient is doing fine today with no specific complaints except for occasional numbness and cramps in her arms and legs.  She denied having any chest pain, shortness breath but continues to have mild cough with production of mild thick mucus.  She denied having any hemoptysis.  The patient denied having any recent weight loss or night sweats.  She has no nausea, vomiting, diarrhea or constipation.  She denied having any headache or visual changes.  She continues to tolerate her treatment with Tagrisso fairly well.  She had a  repeat CT scan of the chest, abdomen and pelvis performed recently and she is here for evaluation and discussion of her risk her results.  MEDICAL HISTORY: Past Medical History:  Diagnosis Date  . Bone metastasis ([place])   . Hemoptysis   . Hypokalemia   . lung ca dx'd 07/2014  . Lung mass   . Metastasis to adrenal gland ([place])   . Metastasis to brain ([place])   . Pneumonia   . Radiation 08/23/14-09/07/14   Brain/chest and left hip 30 Gy 12 Fx  . URI (upper respiratory infection) 02-02-2015    ALLERGIES:  is allergic to other and doxycycline.  MEDICATIONS:  Current Outpatient Medications  Medication Sig Dispense Refill  . AMBULATORY NON FORMULARY MEDICATION Medication Name: MAGIC MOUTHWASH 2 % Viscous Lidocaine  Maalox Benadryl Susp.  Disp: 1:1:1 259m bottle Sig: 151mPO Swish & Spit  q 3-4hrs. PRN mouth sores 200 mL 3  . benzonatate (TESSALON) 100 MG capsule Take 1 capsule (100 mg total) by mouth every 8 (eight) hours as needed. 20 capsule 0  . Calcium Carbonate-Vitamin D (CALCIUM 500 + D) 500-125 MG-UNIT TABS Take 500 mg by mouth 2 (two) times daily.    . chlorpheniramine-HYDROcodone (TUSSIONEX) 10-8 MG/5ML SUER Take 5 mLs by mouth every 12 (twelve) hours as needed. 140 mL 0  . clindamycin (CLINDAGEL) 1 % gel Apply 1 application topically 2 (two) times daily.    . Cyanocobalamin (VITAMIN B-12) 2500 MCG SUBL Place 2,500 mcg under the tongue daily.    . Marland Kitchenextromethorphan Polistirex (DELSYM PO) Take by mouth.    . ENSURE (ENSURE) Take  1 Can by mouth 2 (two) times daily between meals.     . ferrous sulfate 325 (65 FE) MG EC tablet Take 325 mg by mouth daily with breakfast.    . fluticasone (VERAMYST) 27.5 MCG/SPRAY nasal spray Place 2 sprays into the nose daily.    . folic acid (FOLVITE) 947 MCG tablet Take 1 tablet (800 mcg total) by mouth daily. 30 tablet 1  . loratadine (CLARITIN) 10 MG tablet Take 1 tablet (10 mg total) by mouth daily. 30 tablet 0  . Nutritional Supplements (JUICE  PLUS FIBRE PO) Take 1 capsule by mouth daily. Reported on 07/28/2015    . osimertinib mesylate (TAGRISSO) 80 MG tablet Take 1 tablet (80 mg total) by mouth daily. 30 tablet 2  . OVER THE COUNTER MEDICATION "Deep penetrating pain relief oil"    . prochlorperazine (COMPAZINE) 10 MG tablet Take 1 tablet (10 mg total) by mouth every 6 (six) hours as needed for nausea or vomiting. (Patient not taking: Reported on 05/14/2017) 30 tablet 0  . ranitidine (ZANTAC) 300 MG tablet Take 1 tablet (300 mg total) by mouth at bedtime. 30 tablet 6  . TURMERIC PO Take 1 capsule by mouth 2 (two) times daily. Turmeric Superior 750 mg po bid     No current facility-administered medications for this visit.     SURGICAL HISTORY:  Past Surgical History:  Procedure Laterality Date  . IR GENERIC HISTORICAL  07/14/2015   IR RADIOLOGIST EVAL & MGMT 07/14/2015 Greggory Keen, MD [facility] INTERV RAD  . VIDEO BRONCHOSCOPY Bilateral 08/09/2014   Procedure: VIDEO BRONCHOSCOPY WITHOUT FLUORO;  Surgeon: [name], MD;  Location: [facility] ENDOSCOPY;  Service: Cardiopulmonary;  Laterality: Bilateral;  . VIDEO BRONCHOSCOPY Bilateral 07/06/2015   Procedure: VIDEO BRONCHOSCOPY WITHOUT FLUORO;  Surgeon: [name], MD;  Location: [facility] ENDOSCOPY;  Service: Cardiopulmonary;  Laterality: Bilateral;    REVIEW OF SYSTEMS:  Constitutional: negative Eyes: negative Ears, nose, mouth, throat, and face: negative Respiratory: positive for cough Cardiovascular: negative Gastrointestinal: negative Genitourinary:negative Integument/breast: negative Hematologic/lymphatic: negative Musculoskeletal:negative Neurological: negative Behavioral/Psych: negative Endocrine: negative Allergic/Immunologic: negative   PHYSICAL EXAMINATION: General appearance: alert, cooperative and no distress Head: Normocephalic, without obvious abnormality, atraumatic Neck: no adenopathy, no JVD, supple, symmetrical, trachea midline and thyroid not enlarged,  symmetric, no tenderness/mass/nodules Lymph nodes: Cervical, supraclavicular, and axillary nodes normal. Resp: clear to auscultation bilaterally Back: symmetric, no curvature. ROM normal. No CVA tenderness. Cardio: regular rate and rhythm, S1, S2 normal, no murmur, click, rub or gallop GI: soft, non-tender; bowel sounds normal; no masses,  no organomegaly Extremities: extremities normal, atraumatic, no cyanosis or edema Neurologic: Alert and oriented X 3, normal strength and tone. Normal symmetric reflexes. Normal coordination and gait  ECOG PERFORMANCE STATUS: 1 - Symptomatic but completely ambulatory  Blood pressure (!) 99/57, pulse (!) 103, temperature 98 F (36.7 C), resp. rate 18, height _0  (1.499 m), weight 106 lb (48.1 kg), SpO2 100 %.  LABORATORY DATA: Lab Results  Component Value Date   WBC 4.3 06/12/2017   HGB 13.4 06/12/2017   HCT 41.1 06/12/2017   MCV 96.9 06/12/2017   PLT 257 06/12/2017      Chemistry      Component Value Date/Time   NA 139 05/10/2017 0751   NA 140 01/17/2017 1003   K 4.2 05/10/2017 0751   K 3.9 01/17/2017 1003   CL 106 05/10/2017 0751   CO2 25 05/10/2017 0751   CO2 29 01/17/2017 1003   BUN 9 05/10/2017 0751  BUN 14.8 01/17/2017 1003   CREATININE 0.74 05/10/2017 0751   CREATININE 0.8 01/17/2017 1003      Component Value Date/Time   CALCIUM 9.0 05/10/2017 0751   CALCIUM 8.7 01/17/2017 1003   ALKPHOS 46 05/10/2017 0751   ALKPHOS 50 01/17/2017 1003   AST 19 05/10/2017 0751   AST 23 01/17/2017 1003   ALT 20 05/10/2017 0751   ALT 27 01/17/2017 1003   BILITOT 0.4 05/10/2017 0751   BILITOT 0.46 01/17/2017 1003       RADIOGRAPHIC STUDIES: Ct Chest W Contrast  Result Date: 06/07/2017 CLINICAL DATA:  Restaging metastatic non-small cell lung cancer. EXAM: CT CHEST, ABDOMEN, AND PELVIS WITH CONTRAST TECHNIQUE: Multidetector CT imaging of the chest, abdomen and pelvis was performed following the standard protocol during bolus  administration of intravenous contrast. CONTRAST:  199m ISOVUE-300 IOPAMIDOL (ISOVUE-300) INJECTION 61% COMPARISON:  Multiple prior examinations. The most recent is 03/08/2017. FINDINGS: CT CHEST FINDINGS Cardiovascular: The heart is normal in size. No pericardial effusion. The aorta is normal in caliber. No dissection. Mild tortuosity. The branch vessels are patent. Mild pulmonary artery enlargement may suggest pulmonary hypertension. Mediastinum/Nodes: No mediastinal or hilar mass or lymphadenopathy. The esophagus is grossly normal. Lungs/Pleura: Stable dense radiation changes involving the right paramediastinal lung and right hilum. No findings suspicious for recurrent tumor. No worrisome pulmonary nodules to suggest pulmonary metastatic disease. Stable underline emphysematous changes. Musculoskeletal: Stable sclerotic bone lesions involving the vertebral bodies, right clavicle, sternum and ribs. No new lesions or progressive findings. CT ABDOMEN PELVIS FINDINGS Hepatobiliary: No focal hepatic lesions. The gallbladder appears normal. Mild stable common bile duct dilatation. Pancreas: No mass, inflammation or ductal dilatation. Spleen: Normal size.  No focal lesions. Adrenals/Urinary Tract: Interval improvement of the left adrenal gland metastasis. It measured 19 x 17.5 mm on the prior study and now measures 9 x 9 mm. Right adrenal gland is normal. Both kidneys are unremarkable and stable. The bladder is normal. Stomach/Bowel: The stomach, duodenum, small bowel and colon are unremarkable. No acute inflammatory changes, mass lesions or obstructive findings. Vascular/Lymphatic: The aorta and branch vessels are patent. The major venous structures are patent. No mesenteric or retroperitoneal mass or adenopathy. Reproductive: The uterus and ovaries are unremarkable. Other: No pelvic mass or adenopathy. No free pelvic fluid collections. No inguinal mass or adenopathy. No abdominal wall hernia or subcutaneous lesions.  Musculoskeletal: Stable sclerotic bone lesions. No new or progressive findings. IMPRESSION: 1. Stable radiation changes involving the right paramediastinal lung and right hilum. No findings suspicious for recurrent tumor. No pulmonary metastatic disease or mediastinal/hilar adenopathy. 2. Interval significant decrease in size of the left adrenal gland metastasis. 3. No new or progressive findings in the abdomen/pelvis. 4. Stable diffuse scattered sclerotic metastatic bone lesions. No new or progressive findings. 5. Electronically Signed   By: PMarijo SanesM.D.   On: 06/07/2017 10:43   Ct Abdomen Pelvis W Contrast  Result Date: 06/07/2017 CLINICAL DATA:  Restaging metastatic non-small cell lung cancer. EXAM: CT CHEST, ABDOMEN, AND PELVIS WITH CONTRAST TECHNIQUE: Multidetector CT imaging of the chest, abdomen and pelvis was performed following the standard protocol during bolus administration of intravenous contrast. CONTRAST:  1021mISOVUE-300 IOPAMIDOL (ISOVUE-300) INJECTION 61% COMPARISON:  Multiple prior examinations. The most recent is 03/08/2017. FINDINGS: CT CHEST FINDINGS Cardiovascular: The heart is normal in size. No pericardial effusion. The aorta is normal in caliber. No dissection. Mild tortuosity. The branch vessels are patent. Mild pulmonary artery enlargement may suggest pulmonary hypertension. Mediastinum/Nodes: No mediastinal or  hilar mass or lymphadenopathy. The esophagus is grossly normal. Lungs/Pleura: Stable dense radiation changes involving the right paramediastinal lung and right hilum. No findings suspicious for recurrent tumor. No worrisome pulmonary nodules to suggest pulmonary metastatic disease. Stable underline emphysematous changes. Musculoskeletal: Stable sclerotic bone lesions involving the vertebral bodies, right clavicle, sternum and ribs. No new lesions or progressive findings. CT ABDOMEN PELVIS FINDINGS Hepatobiliary: No focal hepatic lesions. The gallbladder appears normal.  Mild stable common bile duct dilatation. Pancreas: No mass, inflammation or ductal dilatation. Spleen: Normal size.  No focal lesions. Adrenals/Urinary Tract: Interval improvement of the left adrenal gland metastasis. It measured 19 x 17.5 mm on the prior study and now measures 9 x 9 mm. Right adrenal gland is normal. Both kidneys are unremarkable and stable. The bladder is normal. Stomach/Bowel: The stomach, duodenum, small bowel and colon are unremarkable. No acute inflammatory changes, mass lesions or obstructive findings. Vascular/Lymphatic: The aorta and branch vessels are patent. The major venous structures are patent. No mesenteric or retroperitoneal mass or adenopathy. Reproductive: The uterus and ovaries are unremarkable. Other: No pelvic mass or adenopathy. No free pelvic fluid collections. No inguinal mass or adenopathy. No abdominal wall hernia or subcutaneous lesions. Musculoskeletal: Stable sclerotic bone lesions. No new or progressive findings. IMPRESSION: 1. Stable radiation changes involving the right paramediastinal lung and right hilum. No findings suspicious for recurrent tumor. No pulmonary metastatic disease or mediastinal/hilar adenopathy. 2. Interval significant decrease in size of the left adrenal gland metastasis. 3. No new or progressive findings in the abdomen/pelvis. 4. Stable diffuse scattered sclerotic metastatic bone lesions. No new or progressive findings. 5. Electronically Signed   By: Marijo Sanes M.D.   On: 06/07/2017 10:43    ASSESSMENT AND PLAN:  This is a very pleasant 47 years old Asian female with stage IV non-small cell lung cancer, adenocarcinoma with positive EGFR mutation with deletion in exon 108 diagnosed in July 2016 status post treatment with Tarceva for a total of 18 months but this was discontinued secondary to disease progression and development of T790M resistant mutation. The patient was started on treatment with Tagrisso 80 mg by mouth daily status post 12  months of treatment. The patient is feeling fine today and continues to tolerate her treatment with Tagrisso fairly well. Repeat CT scan of the chest, abdomen and pelvis showed no concerning findings for disease progression and there was interval decrease in the size of the left adrenal gland metastasis. I personally and independently reviewed the scans and discussed the results with the patient today. I recommended for her to continue her current treatment with Tagrisso with the same dose. I will see her back for follow-up visit in 6 weeks for evaluation with repeat blood work. For the nausea, I will start the patient on Compazine 10 mg p.o. every 6 hours as needed. For the metastatic bone disease, she will continue her current treatment with Xgeva but we will change it to every 6 weeks to be concordant with her follow-up visits. The patient was advised to call immediately if she has any concerning symptoms in the interval. The patient voices understanding of current disease status and treatment options and is in agreement with the current care plan. All questions were answered. The patient knows to call the clinic with any problems, questions or concerns. We can certainly see the patient much sooner if necessary.  Disclaimer: This note was dictated with voice recognition software. Similar sounding words can inadvertently be transcribed and may not be corrected  upon review.

## 2017-06-12 NOTE — Patient Instructions (Signed)
Denosumab injection  What is this medicine?  DENOSUMAB (den oh sue mab) slows bone breakdown. Prolia is used to treat osteoporosis in women after menopause and in men. Xgeva is used to prevent bone fractures and other bone problems caused by cancer bone metastases. Xgeva is also used to treat giant cell tumor of the bone.  This medicine may be used for other purposes; ask your health care provider or pharmacist if you have questions.  What should I tell my health care provider before I take this medicine?  They need to know if you have any of these conditions:  -dental disease  -eczema  -infection or history of infections  -kidney disease or on dialysis  -low blood calcium or vitamin D  -malabsorption syndrome  -scheduled to have surgery or tooth extraction  -taking medicine that contains denosumab  -thyroid or parathyroid disease  -an unusual reaction to denosumab, other medicines, foods, dyes, or preservatives  -pregnant or trying to get pregnant  -breast-feeding  How should I use this medicine?  This medicine is for injection under the skin. It is given by a health care professional in a hospital or clinic setting.  If you are getting Prolia, a special MedGuide will be given to you by the pharmacist with each prescription and refill. Be sure to read this information carefully each time.  For Prolia, talk to your pediatrician regarding the use of this medicine in children. Special care may be needed. For Xgeva, talk to your pediatrician regarding the use of this medicine in children. While this drug may be prescribed for children as young as 13 years for selected conditions, precautions do apply.  Overdosage: If you think you have taken too much of this medicine contact a poison control center or emergency room at once.  NOTE: This medicine is only for you. Do not share this medicine with others.  What if I miss a dose?  It is important not to miss your dose. Call your doctor or health care professional if you are  unable to keep an appointment.  What may interact with this medicine?  Do not take this medicine with any of the following medications:  -other medicines containing denosumab  This medicine may also interact with the following medications:  -medicines that suppress the immune system  -medicines that treat cancer  -steroid medicines like prednisone or cortisone  This list may not describe all possible interactions. Give your health care provider a list of all the medicines, herbs, non-prescription drugs, or dietary supplements you use. Also tell them if you smoke, drink alcohol, or use illegal drugs. Some items may interact with your medicine.  What should I watch for while using this medicine?  Visit your doctor or health care professional for regular checks on your progress. Your doctor or health care professional may order blood tests and other tests to see how you are doing.  Call your doctor or health care professional if you get a cold or other infection while receiving this medicine. Do not treat yourself. This medicine may decrease your body's ability to fight infection.  You should make sure you get enough calcium and vitamin D while you are taking this medicine, unless your doctor tells you not to. Discuss the foods you eat and the vitamins you take with your health care professional.  See your dentist regularly. Brush and floss your teeth as directed. Before you have any dental work done, tell your dentist you are receiving this medicine.  Do   not become pregnant while taking this medicine or for 5 months after stopping it. Women should inform their doctor if they wish to become pregnant or think they might be pregnant. There is a potential for serious side effects to an unborn child. Talk to your health care professional or pharmacist for more information.  What side effects may I notice from receiving this medicine?  Side effects that you should report to your doctor or health care professional as soon as  possible:  -allergic reactions like skin rash, itching or hives, swelling of the face, lips, or tongue  -breathing problems  -chest pain  -fast, irregular heartbeat  -feeling faint or lightheaded, falls  -fever, chills, or any other sign of infection  -muscle spasms, tightening, or twitches  -numbness or tingling  -skin blisters or bumps, or is dry, peels, or red  -slow healing or unexplained pain in the mouth or jaw  -unusual bleeding or bruising  Side effects that usually do not require medical attention (Report these to your doctor or health care professional if they continue or are bothersome.):  -muscle pain  -stomach upset, gas  This list may not describe all possible side effects. Call your doctor for medical advice about side effects. You may report side effects to FDA at 1-800-FDA-1088.  Where should I keep my medicine?  This medicine is only given in a clinic, doctor's office, or other health care setting and will not be stored at home.  NOTE: This sheet is a summary. It may not cover all possible information. If you have questions about this medicine, talk to your doctor, pharmacist, or health care provider.      2016, Elsevier/Gold Standard. (2011-07-02 12:37:47)

## 2017-07-09 ENCOUNTER — Telehealth: Payer: Self-pay | Admitting: Pharmacist

## 2017-07-09 DIAGNOSIS — C7951 Secondary malignant neoplasm of bone: Secondary | ICD-10-CM

## 2017-07-09 DIAGNOSIS — C7972 Secondary malignant neoplasm of left adrenal gland: Secondary | ICD-10-CM

## 2017-07-09 DIAGNOSIS — C7931 Secondary malignant neoplasm of brain: Secondary | ICD-10-CM

## 2017-07-09 DIAGNOSIS — C3491 Malignant neoplasm of unspecified part of right bronchus or lung: Secondary | ICD-10-CM

## 2017-07-09 DIAGNOSIS — Z5111 Encounter for antineoplastic chemotherapy: Secondary | ICD-10-CM

## 2017-07-09 NOTE — Telephone Encounter (Signed)
Oral Oncology Pharmacist Encounter  Received call from patient with questions about [place] to continue to receive Tagrisso (osimertinib) from the manufacturer compassionate use program at $0 out-of-pocket cost to patient.  Patient is without prescription insurance coverage.  Patient will bring reenrollment application and current tax or income documentation to [facility] today for assistance from oral oncology clinic. Once this application was completed we will fax it to the [place] & me prescription savings program.  This encounter will continue to be updated until final determination.  Johny Drilling, PharmD, BCPS, BCOP  07/09/2017 9:37 AM Oral Oncology Clinic [phone]

## 2017-07-10 NOTE — Telephone Encounter (Signed)
Oral Oncology Pharmacist Encounter  Completed application for [place] program successfully faxed to [phone].  This encounter will continue to be updated until final determination.  Johny Drilling, PharmD, BCPS, BCOP  07/10/2017 10:32 AM Oral Oncology Clinic [phone]

## 2017-07-11 MED ORDER — OSIMERTINIB MESYLATE 80 MG PO TABS: 80.0000 mg | ORAL_TABLET | Freq: Every day | ORAL | 11 refills | Status: DC

## 2017-07-11 MED FILL — TAGRISSO 80 MG TABLET: 80 | 30 days supply | Qty: 30 | Fill #0

## 2017-07-11 NOTE — Telephone Encounter (Signed)
Oral Oncology Pharmacist Encounter  Received notification from [facility] and me prescription savings program that they had received reenrollment application fax from the office. During their benefits investigation they note that patient does have prescription insurance coverage and appears to be enrolled with extra help through the [place] office to receive her medications at a discounted price.  If patient is found to have LIS and wants to continue current involvement process with [place] plan the patient or the office must provide the following to the manufacturer in order for application to continue to be process:  LIS approval letter  Copy of prescription drug card (front and back)  Proof of 10% of income out-of-pocket spend on all medical expenses (including prescription expenses) for the current calendar year  I was unaware that patient had prescription drug coverage or was enrolled for extra help through the Social Security office.  Prescription for Tagrisso 80 mg tablets, take 1 tablet by mouth once daily has been E scribed to the [employer] for benefits investigation.  If patient is found to have prescription medication coverage we will perform prior authorization, determine co-pay, and discuss co-pay at local pharmacy with patient. If patient is unable to afford co-pay we will provide the requested documentation to [place] and me prescription savings program in order to continue to obtain medication through the manufacturer.  This encounter will continue to be updated until final determination.  Johny Drilling, PharmD, BCPS, BCOP  07/11/2017 1:17 PM Oral Oncology Clinic [phone]

## 2017-07-12 NOTE — Telephone Encounter (Signed)
Oral Oncology Pharmacist Encounter  Prescription processed at [facility]. Copayment due for 30-day supply of Tagrisso 80 mg tablets is $8.50. Prior authorization for [place] was already on file. It does appear that patient is enrolled in LIS extra help through the Social Security office.  I called and discussed information with patient. Patient states she can afford copayment. Patient states she has approximately 2 weeks of progressive remaining. She will go by the pharmacy and pick up her new fill of Tagrisso today.  Patient instructed that the pharmacy will call her 1 week before she needs her next fill in order to help her stay on track with her medication.  Patient instructed that I will call [place] and cancel her application with that.  Patient expressed understanding and appreciation. She knows to call the office with any additional questions or concerns.  Johny Drilling, PharmD, BCPS, BCOP  07/12/2017 9:14 AM Oral Oncology Clinic [phone]

## 2017-07-23 NOTE — Progress Notes (Signed)
[place] OFFICE PROGRESS NOTE  [name], MD [place] [address]  DIAGNOSIS: Stage IV (T2a, N1, M1b) non-small cell lung cancer, adenocarcinoma with positive EGFR mutation (deletion 19) presented with right middle lobe lung mass, right hilar adenopathy as well as metastatic disease to the bone, brain and left adrenal diagnosed in July 2016.  PRIOR THERAPY: 1) palliative radiotherapy to the brain as well as metastatic bone lesions in the pelvis. 2) Tarceva 150 mg by mouth daily started on 09/25/2014. Status post 5 months of treatment. 3) status post palliative radiotherapy to the right middle lobe lung mass for treatment of hemoptysis. 4) Tarceva 100 mg by mouth daily started 02/19/2015 status post 13 months of treatment. This was discontinued in May 2018 after the patient had disease progression and development of T790M resistant mutation.  5) palliative stereotactic radiotherapy to the left adrenal gland metastatic lesion under the care of Dr. Lisbeth Renshaw completed on April 10, 2017.  CURRENT THERAPY: 1) Tagrisso 80 mg by mouth daily started 06/02/2016.  Status post 13 months of treatment. 2) Xgeva 120 g subcutaneously every 6 weeks.  INTERVAL HISTORY: Deborah Foster 47 y.o. female returns for routine follow-up visit accompanied by an interpreter.  The patient is feeling fine they have no specific complaints except for an intermittent pounding sound in her right ear and itching in her left ear.  She denies sore throats, headaches, and rhinorrhea.  She denies fevers and chills.  Denies chest pain, shortness of breath, hemoptysis.  She has an ongoing nonproductive cough.  Denies nausea, vomiting, constipation, diarrhea.  Denies recent weight loss or night sweats.  The patient continues to tolerate treatment with Tagrisso fairly well.  The patient is here for evaluation and repeat lab work.  MEDICAL HISTORY: Past Medical History:  Diagnosis Date  .  Bone metastasis ([place])   . Hemoptysis   . Hypokalemia   . lung ca dx'd 07/2014  . Lung mass   . Metastasis to adrenal gland ([place])   . Metastasis to brain ([place])   . Pneumonia   . Radiation 08/23/14-09/07/14   Brain/chest and left hip 30 Gy 12 Fx  . URI (upper respiratory infection) 02-02-2015    ALLERGIES:  is allergic to other and doxycycline.  MEDICATIONS:  Current Outpatient Medications  Medication Sig Dispense Refill  . AMBULATORY NON FORMULARY MEDICATION Medication Name: MAGIC MOUTHWASH 2 % Viscous Lidocaine  Maalox Benadryl Susp.  Disp: 1:1:1 271m bottle Sig: 155mPO Swish & Spit  q 3-4hrs. PRN mouth sores 200 mL 3  . benzonatate (TESSALON) 100 MG capsule Take 1 capsule (100 mg total) by mouth every 8 (eight) hours as needed. 20 capsule 0  . Calcium Carbonate-Vitamin D (CALCIUM 500 + D) 500-125 MG-UNIT TABS Take 500 mg by mouth 2 (two) times daily.    . chlorpheniramine-HYDROcodone (TUSSIONEX) 10-8 MG/5ML SUER Take 5 mLs by mouth every 12 (twelve) hours as needed. 140 mL 0  . clindamycin (CLINDAGEL) 1 % gel Apply 1 application topically 2 (two) times daily.    . Cyanocobalamin (VITAMIN B-12) 2500 MCG SUBL Place 2,500 mcg under the tongue daily.    . Marland Kitchenextromethorphan Polistirex (DELSYM PO) Take by mouth.    . ENSURE (ENSURE) Take 1 Can by mouth 2 (two) times daily between meals.     . ferrous sulfate 325 (65 FE) MG EC tablet Take 325 mg by mouth daily with breakfast.    . fluticasone (VERAMYST) 27.5 MCG/SPRAY nasal spray  Place 2 sprays into the nose daily.    . folic acid (FOLVITE) 193 MCG tablet Take 1 tablet (800 mcg total) by mouth daily. 30 tablet 1  . loratadine (CLARITIN) 10 MG tablet Take 1 tablet (10 mg total) by mouth daily. 30 tablet 0  . Nutritional Supplements (JUICE PLUS FIBRE PO) Take 1 capsule by mouth daily. Reported on 07/28/2015    . osimertinib mesylate (TAGRISSO) 80 MG tablet Take 1 tablet (80 mg total) by mouth daily. 30 tablet 2  . OVER THE COUNTER  MEDICATION "Deep penetrating pain relief oil"    . prochlorperazine (COMPAZINE) 10 MG tablet Take 1 tablet (10 mg total) by mouth every 6 (six) hours as needed for nausea or vomiting. (Patient not taking: Reported on 05/14/2017) 30 tablet 0  . ranitidine (ZANTAC) 300 MG tablet Take 1 tablet (300 mg total) by mouth at bedtime. 30 tablet 6  . TURMERIC PO Take 1 capsule by mouth 2 (two) times daily. Turmeric Superior 750 mg po bid     No current facility-administered medications for this visit.    Facility-Administered Medications Ordered in Other Visits  Medication Dose Route Frequency Provider Last Rate Last Dose  . denosumab (XGEVA) injection 120 mg  120 mg Subcutaneous Once [name], MD        SURGICAL HISTORY:  Past Surgical History:  Procedure Laterality Date  . IR GENERIC HISTORICAL  07/14/2015   IR RADIOLOGIST EVAL & MGMT 07/14/2015 Greggory Keen, MD [facility] INTERV RAD  . VIDEO BRONCHOSCOPY Bilateral 08/09/2014   Procedure: VIDEO BRONCHOSCOPY WITHOUT FLUORO;  Surgeon: [name], MD;  Location: [facility] ENDOSCOPY;  Service: Cardiopulmonary;  Laterality: Bilateral;  . VIDEO BRONCHOSCOPY Bilateral 07/06/2015   Procedure: VIDEO BRONCHOSCOPY WITHOUT FLUORO;  Surgeon: [name], MD;  Location: [facility] ENDOSCOPY;  Service: Cardiopulmonary;  Laterality: Bilateral;    REVIEW OF SYSTEMS:   Review of Systems  Constitutional: Negative for appetite change, chills, fatigue, fever and unexpected weight change.  HENT:   Negative for mouth sores, nosebleeds, sore throat and trouble swallowing.  Positive for pounding sensation to her right ear which is intermittent.  Positive for itching to her left ear. Eyes: Negative for eye problems and icterus.  Respiratory: Negative for cough, hemoptysis, shortness of breath and wheezing.   Cardiovascular: Negative for chest pain and leg swelling.  Gastrointestinal: Negative for abdominal pain, constipation, diarrhea, nausea and vomiting.   Genitourinary: Negative for bladder incontinence, difficulty urinating, dysuria, frequency and hematuria.   Musculoskeletal: Negative for back pain, gait problem, neck pain and neck stiffness.  Skin: Negative for itching and rash.  Neurological: Negative for dizziness, extremity weakness, gait problem, headaches, light-headedness and seizures.  Hematological: Negative for adenopathy. Does not bruise/bleed easily.  Psychiatric/Behavioral: Negative for confusion, depression and sleep disturbance. The patient is not nervous/anxious.     PHYSICAL EXAMINATION:  Blood pressure (!) 109/56, pulse 100, temperature 98.1 F (36.7 C), temperature source Oral, resp. rate 17, height _0  (1.499 m), weight 108 lb 11.2 oz (49.3 kg), SpO2 100 %.  ECOG PERFORMANCE STATUS: 1 - Symptomatic but completely ambulatory  Physical Exam  Constitutional: Oriented to person, place, and time and well-developed, well-nourished, and in no distress. No distress.  HENT:  Head: Normocephalic and atraumatic.  Ears: Small amount of cerumen to the left ear.  Right tympanic membrane could not be visualized due to cerumen in the ear canal. Mouth/Throat: Oropharynx is clear and moist. No oropharyngeal exudate.  Eyes: Conjunctivae are normal. Right eye exhibits  no discharge. Left eye exhibits no discharge. No scleral icterus.  Neck: Normal range of motion. Neck supple.  Cardiovascular: Normal rate, regular rhythm, normal heart sounds and intact distal pulses.   Pulmonary/Chest: Effort normal and breath sounds normal. No respiratory distress. No wheezes. No rales.  Abdominal: Soft. Bowel sounds are normal. Exhibits no distension and no mass. There is no tenderness.  Musculoskeletal: Normal range of motion. Exhibits no edema.  Lymphadenopathy:    No cervical adenopathy.  Neurological: Alert and oriented to person, place, and time. Exhibits normal muscle tone. Gait normal. Coordination normal.  Skin: Skin is warm and dry. No  rash noted. Not diaphoretic. No erythema. No pallor.  Psychiatric: Mood, memory and judgment normal.  Vitals reviewed.  LABORATORY DATA: Lab Results  Component Value Date   WBC 4.2 07/24/2017   HGB 12.7 07/24/2017   HCT 38.8 07/24/2017   MCV 97.7 07/24/2017   PLT 251 07/24/2017      Chemistry      Component Value Date/Time   NA 137 07/24/2017 0832   NA 140 01/17/2017 1003   K 4.0 07/24/2017 0832   K 3.9 01/17/2017 1003   CL 105 07/24/2017 0832   CO2 26 07/24/2017 0832   CO2 29 01/17/2017 1003   BUN 13 07/24/2017 0832   BUN 14.8 01/17/2017 1003   CREATININE 0.67 07/24/2017 0832   CREATININE 0.8 01/17/2017 1003      Component Value Date/Time   CALCIUM 8.7 (L) 07/24/2017 0832   CALCIUM 8.7 01/17/2017 1003   ALKPHOS 51 07/24/2017 0832   ALKPHOS 50 01/17/2017 1003   AST 23 07/24/2017 0832   AST 23 01/17/2017 1003   ALT 30 07/24/2017 0832   ALT 27 01/17/2017 1003   BILITOT 0.3 07/24/2017 0832   BILITOT 0.46 01/17/2017 1003       RADIOGRAPHIC STUDIES:  No results found.   ASSESSMENT/PLAN:  Non-small cell carcinoma of lung, stage 4 (HCC) This is a very pleasant 47 year old Asian female with stage IV non-small cell lung cancer, adenocarcinoma with positive EGFR mutation with deletion in exon 19 diagnosed in July 2016 status post treatment with Tarceva for a total of 18 months but this was discontinued secondary to disease progression and development of T790M resistant mutation. The patient was started on treatment with Tagrisso 80 mg by mouth daily status post 13 months of treatment. The patient is feeling fine today and continues to tolerate her treatment with Tagrisso fairly well. Recommend for her to continue on Tagrisso at the current dose. The patient will have a restaging CT scan of the chest, abdomen, pelvis prior to her next visit.  She will follow-up in 6 weeks for discussion of her CT scan results.  The patient CMET was reviewed today.  Calcium level slightly  low.  I advised the patient to increase her calcium supplementation to twice a day.  She is currently only taking this once a day.  Proceed with Xgeva as scheduled.  For the cerumen in her bilateral ears, I have recommended Debrox over-the-counter.  The patient was advised to call immediately if she has any concerning symptoms in the interval. The patient voices understanding of current disease status and treatment options and is in agreement with the current care plan. All questions were answered. The patient knows to call the clinic with any problems, questions or concerns. We can certainly see the patient much sooner if necessary.   Orders Placed This Encounter  Procedures  . CT CHEST W  CONTRAST    Standing Status:   Future    Standing Expiration Date:   07/25/2018    Order Specific Question:   If indicated for the ordered procedure, I authorize the administration of contrast media per Radiology protocol    Answer:   Yes    Order Specific Question:   Preferred imaging location?    Answer:   [facility]    Order Specific Question:   Radiology Contrast Protocol - do NOT remove file path    Answer:   \\[email or web address]    Order Specific Question:   ** REASON FOR EXAM (FREE TEXT)    Answer:   Lung cancer. Restaging.    Order Specific Question:   Is patient pregnant?    Answer:   No  . CT ABDOMEN PELVIS W CONTRAST    Standing Status:   Future    Standing Expiration Date:   07/25/2018    Order Specific Question:   If indicated for the ordered procedure, I authorize the administration of contrast media per Radiology protocol    Answer:   Yes    Order Specific Question:   Preferred imaging location?    Answer:   [facility]    Order Specific Question:   Radiology Contrast Protocol - do NOT remove file path    Answer:   \\[email or web address]    Order Specific Question:   ** REASON FOR EXAM (FREE TEXT)    Answer:   Lung  cancer. Restaging.    Order Specific Question:   Is patient pregnant?    Answer:   No   Mikey Bussing, DNP, AGPCNP-BC, AOCNP 07/24/17

## 2017-07-23 NOTE — Telephone Encounter (Signed)
Oral Oncology Pharmacist Encounter  AZ&me application cancelled.  Johny Drilling, PharmD, BCPS, BCOP  07/23/2017 9:33 AM Oral Oncology Clinic [phone]

## 2017-07-24 ENCOUNTER — Inpatient Hospital Stay (HOSPITAL_BASED_OUTPATIENT_CLINIC_OR_DEPARTMENT_OTHER): Payer: Medicare Other | Admitting: Oncology

## 2017-07-24 ENCOUNTER — Inpatient Hospital Stay: Payer: Medicare Other | Attending: Internal Medicine

## 2017-07-24 ENCOUNTER — Encounter: Payer: Self-pay | Admitting: Oncology

## 2017-07-24 ENCOUNTER — Inpatient Hospital Stay: Payer: Medicare Other

## 2017-07-24 ENCOUNTER — Telehealth: Payer: Self-pay | Admitting: Internal Medicine

## 2017-07-24 VITALS — BP 109/56 | HR 100 | Temp 98.1°F | Resp 17 | Ht 59.0 in | Wt 108.7 lb

## 2017-07-24 DIAGNOSIS — C7931 Secondary malignant neoplasm of brain: Secondary | ICD-10-CM | POA: Insufficient documentation

## 2017-07-24 DIAGNOSIS — C342 Malignant neoplasm of middle lobe, bronchus or lung: Secondary | ICD-10-CM | POA: Diagnosis not present

## 2017-07-24 DIAGNOSIS — M9080 Osteopathy in diseases classified elsewhere, unspecified site: Secondary | ICD-10-CM

## 2017-07-24 DIAGNOSIS — C7972 Secondary malignant neoplasm of left adrenal gland: Secondary | ICD-10-CM | POA: Insufficient documentation

## 2017-07-24 DIAGNOSIS — C3431 Malignant neoplasm of lower lobe, right bronchus or lung: Secondary | ICD-10-CM

## 2017-07-24 DIAGNOSIS — H6123 Impacted cerumen, bilateral: Secondary | ICD-10-CM | POA: Insufficient documentation

## 2017-07-24 DIAGNOSIS — Z9221 Personal history of antineoplastic chemotherapy: Secondary | ICD-10-CM

## 2017-07-24 DIAGNOSIS — Z923 Personal history of irradiation: Secondary | ICD-10-CM | POA: Insufficient documentation

## 2017-07-24 DIAGNOSIS — Z79899 Other long term (current) drug therapy: Secondary | ICD-10-CM | POA: Diagnosis not present

## 2017-07-24 DIAGNOSIS — C3491 Malignant neoplasm of unspecified part of right bronchus or lung: Secondary | ICD-10-CM

## 2017-07-24 DIAGNOSIS — E889 Metabolic disorder, unspecified: Secondary | ICD-10-CM

## 2017-07-24 DIAGNOSIS — C7951 Secondary malignant neoplasm of bone: Secondary | ICD-10-CM

## 2017-07-24 DIAGNOSIS — Z5111 Encounter for antineoplastic chemotherapy: Secondary | ICD-10-CM

## 2017-07-24 DIAGNOSIS — M898X9 Other specified disorders of bone, unspecified site: Secondary | ICD-10-CM

## 2017-07-24 LAB — CBC WITH DIFFERENTIAL (CANCER CENTER ONLY)
BASOS ABS: 0 10*3/uL (ref 0.0–0.1)
Basophils Relative: 1 %
EOS PCT: 2 %
Eosinophils Absolute: 0.1 10*3/uL (ref 0.0–0.5)
HEMATOCRIT: 38.8 % (ref 34.8–46.6)
HEMOGLOBIN: 12.7 g/dL (ref 11.6–15.9)
LYMPHS PCT: 22 %
Lymphs Abs: 0.9 10*3/uL (ref 0.9–3.3)
MCH: 32 pg (ref 25.1–34.0)
MCHC: 32.7 g/dL (ref 31.5–36.0)
MCV: 97.7 fL (ref 79.5–101.0)
Monocytes Absolute: 0.4 10*3/uL (ref 0.1–0.9)
Monocytes Relative: 10 %
NEUTROS ABS: 2.8 10*3/uL (ref 1.5–6.5)
Neutrophils Relative %: 65 %
PLATELETS: 251 10*3/uL (ref 145–400)
RBC: 3.97 MIL/uL (ref 3.70–5.45)
RDW: 13.5 % (ref 11.2–14.5)
WBC: 4.2 10*3/uL (ref 3.9–10.3)

## 2017-07-24 LAB — CMP (CANCER CENTER ONLY)
ALT: 30 U/L (ref 0–44)
AST: 23 U/L (ref 15–41)
Albumin: 3.8 g/dL (ref 3.5–5.0)
Alkaline Phosphatase: 51 U/L (ref 38–126)
Anion gap: 6 (ref 5–15)
BUN: 13 mg/dL (ref 6–20)
CHLORIDE: 105 mmol/L (ref 98–111)
CO2: 26 mmol/L (ref 22–32)
Calcium: 8.7 mg/dL — ABNORMAL LOW (ref 8.9–10.3)
Creatinine: 0.67 mg/dL (ref 0.44–1.00)
Glucose, Bld: 91 mg/dL (ref 70–99)
Potassium: 4 mmol/L (ref 3.5–5.1)
Sodium: 137 mmol/L (ref 135–145)
TOTAL PROTEIN: 6.6 g/dL (ref 6.5–8.1)
Total Bilirubin: 0.3 mg/dL (ref 0.3–1.2)

## 2017-07-24 MED ORDER — OSIMERTINIB MESYLATE 80 MG PO TABS: 80.0000 mg | ORAL_TABLET | Freq: Every day | ORAL | 2 refills | Status: DC

## 2017-07-24 MED ORDER — DENOSUMAB 120 MG/1.7ML ~~LOC~~ SOLN
120.0000 mg | Freq: Once | SUBCUTANEOUS | Status: AC
  Administered 2017-07-24: 120 mg via SUBCUTANEOUS

## 2017-07-24 NOTE — Telephone Encounter (Signed)
Appointments scheduled AVS/Calendar printed / contrast material provided with instructions per 7/10 los

## 2017-07-24 NOTE — Assessment & Plan Note (Signed)
This is a very pleasant 47 year old Asian female with stage IV non-small cell lung cancer, adenocarcinoma with positive EGFR mutation with deletion in exon 76 diagnosed in July 2016 status post treatment with Tarceva for a total of 18 months but this was discontinued secondary to disease progression and development of T790M resistant mutation. The patient was started on treatment with Tagrisso 80 mg by mouth daily status post 13 months of treatment. The patient is feeling fine today and continues to tolerate her treatment with Tagrisso fairly well. Recommend for her to continue on Tagrisso at the current dose. The patient will have a restaging CT scan of the chest, abdomen, pelvis prior to her next visit.  She will follow-up in 6 weeks for discussion of her CT scan results.  The patient CMET was reviewed today.  Calcium level slightly low.  I advised the patient to increase her calcium supplementation to twice a day.  She is currently only taking this once a day.  Proceed with Xgeva as scheduled.  For the cerumen in her bilateral ears, I have recommended Debrox over-the-counter.  The patient was advised to call immediately if she has any concerning symptoms in the interval. The patient voices understanding of current disease status and treatment options and is in agreement with the current care plan. All questions were answered. The patient knows to call the clinic with any problems, questions or concerns. We can certainly see the patient much sooner if necessary.

## 2017-07-24 NOTE — Progress Notes (Signed)
Per Mikey Bussing, ok to administer injection with calcium 8.7. Advised pt to take calcium x2 daily instead of once daily. Shenica Holzheimer LPN

## 2017-07-25 ENCOUNTER — Ambulatory Visit
Admission: RE | Admit: 2017-07-25 | Discharge: 2017-07-25 | Disposition: A | Discharge status: Home / Self Care | Payer: Medicare Other | Source: Ambulatory Visit | Attending: Radiation Oncology | Admitting: Radiation Oncology

## 2017-07-25 DIAGNOSIS — C7931 Secondary malignant neoplasm of brain: Secondary | ICD-10-CM

## 2017-07-25 DIAGNOSIS — C7949 Secondary malignant neoplasm of other parts of nervous system: Principal | ICD-10-CM

## 2017-07-25 MED ORDER — GADOBENATE DIMEGLUMINE 529 MG/ML IV SOLN
9.0000 mL | Freq: Once | INTRAVENOUS | Status: AC | PRN
Start: 2017-07-25 — End: 2017-07-25
  Administered 2017-07-25: 9 mL via INTRAVENOUS

## 2017-07-29 ENCOUNTER — Encounter: Payer: Self-pay | Admitting: Radiation Oncology

## 2017-07-29 ENCOUNTER — Other Ambulatory Visit: Payer: Self-pay

## 2017-07-29 ENCOUNTER — Ambulatory Visit
Admission: RE | Admit: 2017-07-29 | Discharge: 2017-07-29 | Disposition: A | Discharge status: Home / Self Care | Payer: Medicare Other | Source: Ambulatory Visit | Attending: Radiation Oncology | Admitting: Radiation Oncology

## 2017-07-29 VITALS — BP 98/62 | HR 106 | Temp 97.9°F | Resp 20 | Ht 59.0 in | Wt 104.8 lb

## 2017-07-29 DIAGNOSIS — K219 Gastro-esophageal reflux disease without esophagitis: Secondary | ICD-10-CM | POA: Insufficient documentation

## 2017-07-29 DIAGNOSIS — E876 Hypokalemia: Secondary | ICD-10-CM | POA: Diagnosis not present

## 2017-07-29 DIAGNOSIS — C342 Malignant neoplasm of middle lobe, bronchus or lung: Secondary | ICD-10-CM | POA: Insufficient documentation

## 2017-07-29 DIAGNOSIS — Z9221 Personal history of antineoplastic chemotherapy: Secondary | ICD-10-CM | POA: Insufficient documentation

## 2017-07-29 DIAGNOSIS — R1012 Left upper quadrant pain: Secondary | ICD-10-CM | POA: Diagnosis not present

## 2017-07-29 DIAGNOSIS — C7972 Secondary malignant neoplasm of left adrenal gland: Secondary | ICD-10-CM | POA: Diagnosis not present

## 2017-07-29 DIAGNOSIS — Z923 Personal history of irradiation: Secondary | ICD-10-CM | POA: Insufficient documentation

## 2017-07-29 DIAGNOSIS — C7931 Secondary malignant neoplasm of brain: Secondary | ICD-10-CM | POA: Insufficient documentation

## 2017-07-29 DIAGNOSIS — C7951 Secondary malignant neoplasm of bone: Secondary | ICD-10-CM | POA: Insufficient documentation

## 2017-07-29 DIAGNOSIS — C3491 Malignant neoplasm of unspecified part of right bronchus or lung: Secondary | ICD-10-CM

## 2017-07-29 NOTE — Addendum Note (Signed)
Encounter addended by: Malena Edman, RN on: 07/29/2017 2:08 PM  Actions taken: Charge Capture section accepted

## 2017-07-29 NOTE — Progress Notes (Signed)
Deborah Foster 47 y.o. man with  Stage IV T2a, N1, M1b NSCLC, adenocarcinoma of the right middle lobe with metastatic disease to bone and brain radiation completed 08-03-15, review 12-13-16 MRI brain w wo contrast, FU.   Headache:No Pain:No Nausea/Vomiting/Ataxia:No Visional changes(Blurred/Diplopia (double vision), blind spots and peripheral vision changes):No Ring in ears:Has ringing in her right ear sometimes. Skin change:Face improving using using clindamycin gel 1% prn. Fatigue:Having fatigue mild some days takes naps. Cognitive changes:Alert and oriented x 3 this morning. Reports she is forgetful.                                                 Weight:Appetite less at times unable to determine what she wants to eat. Wt Readings from Last 3 Encounters:  07/29/17 104 lb 12.8 oz (47.5 kg)  07/24/17 108 lb 11.2 oz (49.3 kg)  06/12/17 106 lb (48.1 kg)  BP 98/62 (BP Location: Right Arm, Patient Position: Sitting, Cuff Size: Normal)   Pulse (!) 106   Temp 97.9 F (36.6 C) (Oral)   Resp 20   Ht 4\' 11"  (1.499 m)   Wt 104 lb 12.8 oz (47.5 kg)   LMP 08/20/2014   SpO2 100%   BMI 21.17 kg/m

## 2017-07-29 NOTE — Progress Notes (Signed)
Radiation Oncology         [phone] ________________________________  Name: Deborah Foster MRN: [id number]  Date of Service: 07/29/2017 DOB: 10/06/1970  Post Treatment Note  CC: [name], MD  [name], MD  Diagnosis:  Stage IV T2a, N1, M1b NSCLC, adenocarcinoma of the right middle lobe    Interval Since Last Radiation:  5 weeks   04/01/2017 - 04/10/2017: Left Adrenal Gland / 40 Gy in 5 fractions  07/21/2015 to 08/03/2015:  The Right hilar tumor was treated to 30 Gy in 10 fractions at 3 Gy per fraction.   08/23/14-09/07/14:  30 Gy in 12 fractions was prescribed to the right lung, whole brain, and left hip.   Narrative:    Mrs. Deborah Foster is a pleasant 47 y.o. woman with stage IV, adenocarcinoma with EGFR mutation of the right  Middle lobe,  which was identified in the summer of 2016. She received whole brain and right lung radiotherapy along the left hip treatment in August 2016. She's been followed in surveillance since, she recurred locally in the chest, and received radiotherapy in the summer of 2017.  She was found to have progressive disease in her left adrenal gland on CT on 03/08/17,  measuring 19 x 18 mm, previously 12 x 10.5 mm. She completed SBRT and continues [place]. She had repeat imaging in May 2019 that revealed improvement in the treated left adrenal mass and stable disease without progression or new changes. She had repeat imaging of the brain on 07/25/17 that revealed stability in 7 of the visible treated lesions. She comes today to review this.                  On review of systems, the patient reports that she is doing well overall. She denies any chest pain, shortness of breath, cough, fevers, chills, night sweats, unintended weight changes. Seh denies any bowel or bladder disturbances, and denies abdominal pain but occasional abdominal bloating and sharp pains in her LUQ. She denies fevers when this occurs and reports her appetite has been  somewhat less and would like to see our nutritionist. She also stopped taking an H2 blocker, Zantac. She denies any new musculoskeletal or joint aches or pains, new skin lesions or concerns. A complete review of systems is obtained and is otherwise negative.   Past Medical History:  Past Medical History:  Diagnosis Date  . Bone metastasis ([place])   . Hemoptysis   . Hypokalemia   . lung ca dx'd 07/2014  . Lung mass   . Metastasis to adrenal gland (Deborah Foster)   . Metastasis to brain ([place])   . Pneumonia   . Radiation 08/23/14-09/07/14   Brain/chest and left hip 30 Gy 12 Fx  . URI (upper respiratory infection) 02-02-2015    Past Surgical History: Past Surgical History:  Procedure Laterality Date  . IR GENERIC HISTORICAL  07/14/2015   IR RADIOLOGIST EVAL & MGMT 07/14/2015 Greggory Keen, MD [facility] INTERV RAD  . VIDEO BRONCHOSCOPY Bilateral 08/09/2014   Procedure: VIDEO BRONCHOSCOPY WITHOUT FLUORO;  Surgeon: [name], MD;  Location[facility] ENDOSCOPY;  Service: Cardiopulmonary;  Laterality: Bilateral;  . VIDEO BRONCHOSCOPY Bilateral 07/06/2015   Procedure: VIDEO BRONCHOSCOPY WITHOUT FLUORO;  Surgeon: [name], MD;  Location: [facility] ENDOSCOPY;  Service: Cardiopulmonary;  Laterality: Bilateral;    Social History:  Social History   Socioeconomic History  . Marital status: Married    Spouse name: Not on file  .  Number of children: 3  . Years of education: Not on file  . Highest education level: Not on file  Occupational History  . Occupation: unemployed  Social Needs  . Financial resource strain: Not on file  . Food insecurity:    Worry: Not on file    Inability: Not on file  . Transportation needs:    Medical: Not on file    Non-medical: Not on file  Tobacco Use  . Smoking status: Never Smoker  . Smokeless tobacco: Never Used  Substance and Sexual Activity  . Alcohol use: No    Alcohol/week: 0.0 oz  . Drug use: No  . Sexual activity: Not Currently  Lifestyle  . Physical  activity:    Days per week: Not on file    Minutes per session: Not on file  . Stress: Not on file  Relationships  . Social connections:    Talks on phone: Not on file    Gets together: Not on file    Attends religious service: Not on file    Active member of club or organization: Not on file    Attends meetings of clubs or organizations: Not on file    Relationship status: Not on file  . Intimate partner violence:    Fear of current or ex partner: Not on file    Emotionally abused: Not on file    Physically abused: Not on file    Forced sexual activity: Not on file  Other Topics Concern  . Not on file  Social History Narrative   Patient moved to [place] in [place].   Had been in [place] since that time.  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 She's originally from [place]. She has three sons, 29, 13, and 9.  Family History: Family History  Problem Relation Age of Onset  . Hyperlipidemia Mother      ALLERGIES:  is allergic to other and doxycycline.  Meds: Current Outpatient Medications  Medication Sig Dispense Refill  . benzonatate (TESSALON) 100 MG capsule Take 1 capsule (100 mg total) by mouth every 8 (eight) hours as needed. 20 capsule 0  . Calcium Carbonate-Vitamin D (CALCIUM 500 + D) 500-125 MG-UNIT TABS Take 500 mg by mouth 2 (two) times daily.    . chlorpheniramine-HYDROcodone (TUSSIONEX) 10-8 MG/5ML SUER Take 5 mLs by mouth every 12 (twelve) hours as needed. 140 mL 0  . Cyanocobalamin (VITAMIN B-12) 2500 MCG SUBL Place 2,500 mcg under the tongue daily.    Marland Kitchen Dextromethorphan Polistirex (DELSYM PO) Take by mouth.    . ENSURE (ENSURE) Take 1 Can by mouth 2 (two) times daily between meals.     . ferrous sulfate 325 (65 FE) MG EC tablet Take 325 mg by mouth daily with breakfast.    . folic acid (FOLVITE) 527 MCG tablet Take 1 tablet (800 mcg total) by mouth daily. 30 tablet 1  . loratadine (CLARITIN) 10 MG tablet Take 1 tablet (10 mg total) by mouth daily.  30 tablet 0  . Nutritional Supplements (JUICE PLUS FIBRE PO) Take 1 capsule by mouth daily. Reported on 07/28/2015    . osimertinib mesylate (TAGRISSO) 80 MG tablet Take 1 tablet (80 mg total) by mouth daily. 30 tablet 2  . OVER THE COUNTER MEDICATION "Deep penetrating pain relief oil"    . prochlorperazine (COMPAZINE) 10 MG tablet Take 1 tablet (10 mg total) by mouth every 6 (six) hours as needed for nausea or vomiting. 30 tablet 0  .  ranitidine (ZANTAC) 300 MG tablet Take 1 tablet (300 mg total) by mouth at bedtime. 30 tablet 6  . TURMERIC PO Take 1 capsule by mouth 2 (two) times daily. Turmeric Superior 750 mg po bid    . AMBULATORY NON FORMULARY MEDICATION Medication Name: MAGIC MOUTHWASH 2 % Viscous Lidocaine  Maalox Benadryl Susp.  Disp: 1:1:1 242m bottle Sig: 123mPO Swish & Spit  q 3-4hrs. PRN mouth sores (Patient not taking: Reported on 07/29/2017) 200 mL 3  . clindamycin (CLINDAGEL) 1 % gel Apply 1 application topically 2 (two) times daily.    . fluticasone (VERAMYST) 27.5 MCG/SPRAY nasal spray Place 2 sprays into the nose daily.     No current facility-administered medications for this encounter.     Physical Findings:  height is _0  (1.499 m) and weight is 104 lb 12.8 oz (47.5 kg). Her oral temperature is 97.9 F (36.6 C). Her blood pressure is 98/62 and her pulse is 106 (abnormal). Her respiration is 20 and oxygen saturation is 100%.  Pain Assessment Pain Score: 0-No pain/10 In general this is a well appearing Asian female in no acute distress. She's alert and oriented x4 and appropriate throughout the examination. Cardiopulmonary assessment is negative for acute distress and she exhibits normal effort.  Abdomen has active bowel sounds in all quadrants and is intact. The abdomen is soft, non tender, non distended, no rebound or guarding is noted. No palpable hernia is noted. Lower extremities are negative for pretibial pitting edema, deep calf tenderness, cyanosis or clubbing.  She appears to be clinically stable without focal neurologic deficits.   Lab Findings: Lab Results  Component Value Date   WBC 4.2 07/24/2017   HGB 12.7 07/24/2017   HCT 38.8 07/24/2017   MCV 97.7 07/24/2017   PLT 251 07/24/2017     Radiographic Findings: Mr BrJeri CosoJAontrast  Result Date: 07/25/2017 CLINICAL DATA:  Multiple metastatic lesions on MRI, post SRS. Also post whole-brain radiotherapy. No current complaints. EXAM: MRI HEAD WITHOUT AND WITH CONTRAST TECHNIQUE: Multiplanar, multiecho pulse sequences of the brain and surrounding structures were obtained without and with intravenous contrast. CONTRAST:  86m27mULTIHANCE GADOBENATE DIMEGLUMINE 529 MG/ML IV SOLN COMPARISON:  Most recent 03/15/2017. FINDINGS: Brain: No acute stroke, acute hemorrhage, mass lesion, hydrocephalus, or extra-axial fluid. Multiple treated lesions demonstrate both susceptibility as well as intrinsic precontrast T1 shortening. No significant abnormal postcontrast enhancement. No new areas of susceptibility are noted on gradient sequence. From inferior to superior, as seen on series 10, stable treated lesions include: -4 mm, LEFT cerebellum, image 45. -16 mm, LEFT posterior temporal, image 67. -3 mm, RIGHT occipital lobe, image 69. -5 mm, RIGHT medial occipital lobe, image 73. -10 mm, LEFT frontal lobe, image 104. -4 mm, medial RIGHT frontal lobe, image 106. -7 mm, medial RIGHT parietal lobe, image 111. Vascular: Normal flow voids. Skull and upper cervical spine: Normal marrow signal. Sinuses/Orbits: Negative. Other: None. IMPRESSION: Total of 7 treated brain metastases are stable from March 2019, without evidence for local recurrence, post treatment effect, or new metastatic deposits. Electronically Signed   By: JohStaci RighterD.   On: 07/25/2017 13:43    Impression/Plan: 1. Stage IV T2a, N1, M1b NSCLC, adenocarcinoma, positive EGFR mutation with deletion in exon 19, of the right middle lobe. The patient appears to  be doing well clinically and radiographically. She will continue Tagrisso and follow up with CT imaging later in August and will follow up with Dr. MohJulien Nordmann that time. We  recommend proceeding with repeat imaging of the brain in 6 months time. She is in agreement with this plan. 2. LUQ discomfort and queasiness. The patient has a history of reflux and reports she had normal EGD and colonoscopy about 2 years ago. She stopped her Zantac and I encouraged her to try to start taking a PPI for at least 2 weeks and possibly use OTC Simethicone to see if this helps. If her symptoms progress or do not respond to therapy she may need to be seen by GI or see Dr. Julien Nordmann sooner for her staging CT C/A/P. She is in agreement and will keep [place] informed. She will also be sent for evaluation with nutrition.      Carola Rhine, PAC

## 2017-08-02 ENCOUNTER — Telehealth: Payer: Self-pay | Admitting: Nutrition

## 2017-08-02 NOTE — Telephone Encounter (Signed)
Scheduled appt per 7/15 sch message - pt is aware of apt.

## 2017-08-05 MED FILL — TAGRISSO 80 MG TABLET: 80 | 30 days supply | Qty: 30 | Fill #1

## 2017-08-13 ENCOUNTER — Inpatient Hospital Stay: Payer: Medicare Other | Admitting: Nutrition

## 2017-08-13 NOTE — Progress Notes (Signed)
47 year old female diagnosed with metastatic lung cancer.  She is a patient of Dr. Julien Nordmann.  Past medical history includes radiation therapy.  Medications include Magic mouthwash, calcium with vitamin D, vitamin B12, ferrous sulfate, Juice Plus, Compazine, Zantac, and turmeric.  Labs were reviewed.  Height: 4 feet 11 inches. Weight: 104.8 pounds July 15. Usual body weight: 110 pounds. BMI: 21.17.  Patient reports she has lost her appetite. She has some difficulty swallowing.   She denies nausea. She consumes Ensure when she has enough money to purchase.  Nutrition diagnosis:  Unintended weight loss related to metastatic lung cancer as evidenced by 5.2 pound weight loss from usual body weight.  Intervention: I educated patient to consume smaller, high-calorie, high-protein foods throughout the day. Provided strategies for easier swallowing. Provided patient with fact sheets. Recommended patient increase oral nutrition supplements to ensure Enlive 3 times daily. Provided 1 complementary case of Ensure Enlive.  Also provided patient with coupons. Questions were answered.  Teach back method used.  Contact information provided.  Monitoring, evaluation, goals: Patient will increase calories and protein to minimize further weight loss.  Next visit: Patient will contact me with questions and concerns.  **Disclaimer: This note was dictated with voice recognition software. Similar sounding words can inadvertently be transcribed and this note may contain transcription errors which may not have been corrected upon publication of note.**

## 2017-08-29 MED FILL — TAGRISSO 80 MG TABLET: 80 | 30 days supply | Qty: 30 | Fill #2

## 2017-09-02 ENCOUNTER — Encounter (HOSPITAL_COMMUNITY): Payer: Self-pay

## 2017-09-02 ENCOUNTER — Ambulatory Visit (HOSPITAL_COMMUNITY)
Admission: RE | Admit: 2017-09-02 | Discharge: 2017-09-02 | Disposition: A | Discharge status: Home / Self Care | Payer: Medicare Other | Source: Ambulatory Visit | Attending: Oncology | Admitting: Oncology

## 2017-09-02 ENCOUNTER — Inpatient Hospital Stay: Payer: Medicare Other | Attending: Internal Medicine

## 2017-09-02 DIAGNOSIS — R05 Cough: Secondary | ICD-10-CM | POA: Diagnosis not present

## 2017-09-02 DIAGNOSIS — C7951 Secondary malignant neoplasm of bone: Secondary | ICD-10-CM | POA: Insufficient documentation

## 2017-09-02 DIAGNOSIS — C7931 Secondary malignant neoplasm of brain: Secondary | ICD-10-CM | POA: Diagnosis not present

## 2017-09-02 DIAGNOSIS — Z9221 Personal history of antineoplastic chemotherapy: Secondary | ICD-10-CM | POA: Insufficient documentation

## 2017-09-02 DIAGNOSIS — Z79899 Other long term (current) drug therapy: Secondary | ICD-10-CM | POA: Insufficient documentation

## 2017-09-02 DIAGNOSIS — Y842 Radiological procedure and radiotherapy as the cause of abnormal reaction of the patient, or of later complication, without mention of misadventure at the time of the procedure: Secondary | ICD-10-CM | POA: Diagnosis not present

## 2017-09-02 DIAGNOSIS — C3491 Malignant neoplasm of unspecified part of right bronchus or lung: Secondary | ICD-10-CM | POA: Insufficient documentation

## 2017-09-02 DIAGNOSIS — Z923 Personal history of irradiation: Secondary | ICD-10-CM | POA: Insufficient documentation

## 2017-09-02 DIAGNOSIS — C7972 Secondary malignant neoplasm of left adrenal gland: Secondary | ICD-10-CM | POA: Diagnosis not present

## 2017-09-02 DIAGNOSIS — C342 Malignant neoplasm of middle lobe, bronchus or lung: Secondary | ICD-10-CM | POA: Insufficient documentation

## 2017-09-02 DIAGNOSIS — R5383 Other fatigue: Secondary | ICD-10-CM | POA: Insufficient documentation

## 2017-09-02 LAB — CBC WITH DIFFERENTIAL/PLATELET
BASOS ABS: 0 10*3/uL (ref 0.0–0.1)
BASOS PCT: 1 %
Eosinophils Absolute: 0.1 10*3/uL (ref 0.0–0.5)
Eosinophils Relative: 2 %
HCT: 41.4 % (ref 34.8–46.6)
Hemoglobin: 14 g/dL (ref 11.6–15.9)
Lymphocytes Relative: 18 %
Lymphs Abs: 0.8 10*3/uL — ABNORMAL LOW (ref 0.9–3.3)
MCH: 32 pg (ref 25.1–34.0)
MCHC: 33.7 g/dL (ref 31.5–36.0)
MCV: 95 fL (ref 79.5–101.0)
MONO ABS: 0.4 10*3/uL (ref 0.1–0.9)
Monocytes Relative: 10 %
Neutro Abs: 3.1 10*3/uL (ref 1.5–6.5)
Neutrophils Relative %: 69 %
Platelets: 272 10*3/uL (ref 145–400)
RBC: 4.36 MIL/uL (ref 3.70–5.45)
RDW: 13.4 % (ref 11.2–14.5)
WBC: 4.5 10*3/uL (ref 3.9–10.3)

## 2017-09-02 LAB — COMPREHENSIVE METABOLIC PANEL
ALBUMIN: 3.9 g/dL (ref 3.5–5.0)
ALT: 38 U/L (ref 0–44)
AST: 25 U/L (ref 15–41)
Alkaline Phosphatase: 62 U/L (ref 38–126)
Anion gap: 9 (ref 5–15)
BUN: 18 mg/dL (ref 6–20)
CHLORIDE: 104 mmol/L (ref 98–111)
CO2: 28 mmol/L (ref 22–32)
CREATININE: 0.68 mg/dL (ref 0.44–1.00)
Calcium: 9.1 mg/dL (ref 8.9–10.3)
GFR calc Af Amer: >60 mL/min (ref >60)
GFR calc non Af Amer: >60 mL/min (ref >60)
GLUCOSE: 83 mg/dL (ref 70–99)
POTASSIUM: 4.6 mmol/L (ref 3.5–5.1)
Sodium: 141 mmol/L (ref 135–145)
Total Bilirubin: 0.2 mg/dL — ABNORMAL LOW (ref 0.3–1.2)
Total Protein: 7.3 g/dL (ref 6.5–8.1)

## 2017-09-02 MED ORDER — IOHEXOL 300 MG/ML  SOLN
100.0000 mL | Freq: Once | INTRAMUSCULAR | Status: AC | PRN
  Administered 2017-09-02: 80 mL via INTRAVENOUS

## 2017-09-04 ENCOUNTER — Inpatient Hospital Stay: Payer: Medicare Other

## 2017-09-04 ENCOUNTER — Other Ambulatory Visit: Payer: Self-pay

## 2017-09-04 ENCOUNTER — Telehealth: Payer: Self-pay

## 2017-09-04 ENCOUNTER — Inpatient Hospital Stay (HOSPITAL_BASED_OUTPATIENT_CLINIC_OR_DEPARTMENT_OTHER): Payer: Medicare Other | Admitting: Internal Medicine

## 2017-09-04 ENCOUNTER — Encounter: Payer: Self-pay | Admitting: Internal Medicine

## 2017-09-04 VITALS — BP 113/72 | HR 101 | Temp 97.7°F | Resp 22 | Ht 59.0 in | Wt 107.4 lb

## 2017-09-04 DIAGNOSIS — C3431 Malignant neoplasm of lower lobe, right bronchus or lung: Secondary | ICD-10-CM

## 2017-09-04 DIAGNOSIS — C7931 Secondary malignant neoplasm of brain: Secondary | ICD-10-CM | POA: Diagnosis not present

## 2017-09-04 DIAGNOSIS — M9080 Osteopathy in diseases classified elsewhere, unspecified site: Secondary | ICD-10-CM

## 2017-09-04 DIAGNOSIS — M898X9 Other specified disorders of bone, unspecified site: Secondary | ICD-10-CM

## 2017-09-04 DIAGNOSIS — C7972 Secondary malignant neoplasm of left adrenal gland: Secondary | ICD-10-CM

## 2017-09-04 DIAGNOSIS — C342 Malignant neoplasm of middle lobe, bronchus or lung: Secondary | ICD-10-CM | POA: Diagnosis not present

## 2017-09-04 DIAGNOSIS — C3491 Malignant neoplasm of unspecified part of right bronchus or lung: Secondary | ICD-10-CM | POA: Diagnosis not present

## 2017-09-04 DIAGNOSIS — Y842 Radiological procedure and radiotherapy as the cause of abnormal reaction of the patient, or of later complication, without mention of misadventure at the time of the procedure: Secondary | ICD-10-CM

## 2017-09-04 DIAGNOSIS — Z5111 Encounter for antineoplastic chemotherapy: Secondary | ICD-10-CM

## 2017-09-04 DIAGNOSIS — E889 Metabolic disorder, unspecified: Secondary | ICD-10-CM

## 2017-09-04 DIAGNOSIS — C7951 Secondary malignant neoplasm of bone: Secondary | ICD-10-CM

## 2017-09-04 DIAGNOSIS — Z79899 Other long term (current) drug therapy: Secondary | ICD-10-CM

## 2017-09-04 DIAGNOSIS — R05 Cough: Secondary | ICD-10-CM

## 2017-09-04 DIAGNOSIS — R5383 Other fatigue: Secondary | ICD-10-CM

## 2017-09-04 DIAGNOSIS — Z923 Personal history of irradiation: Secondary | ICD-10-CM

## 2017-09-04 DIAGNOSIS — Z9221 Personal history of antineoplastic chemotherapy: Secondary | ICD-10-CM

## 2017-09-04 MED ORDER — DENOSUMAB 120 MG/1.7ML ~~LOC~~ SOLN
120.0000 mg | Freq: Once | SUBCUTANEOUS | Status: AC
  Administered 2017-09-04: 120 mg via SUBCUTANEOUS

## 2017-09-04 NOTE — Telephone Encounter (Signed)
Printed avs and calender of upcoming appointment.per 8/21 los Already scheduled

## 2017-09-04 NOTE — Progress Notes (Signed)
[place] Telephone:(336) [phone]   Fax:(336) [phone]  OFFICE PROGRESS NOTE  [name], MD [place] [address]  DIAGNOSIS: Stage IV (T2a, N1, M1b) non-small cell lung cancer, adenocarcinoma with positive EGFR mutation (deletion 79) presented with right middle lobe lung mass, right hilar adenopathy as well as metastatic disease to the bone, brain and left adrenal diagnosed in July 2016.  PRIOR THERAPY: 1) palliative radiotherapy to the brain as well as metastatic bone lesions in the pelvis. 2) Tarceva 150 mg by mouth daily started on 09/25/2014. Status post 5 months of treatment. 3) status post palliative radiotherapy to the right middle lobe lung mass for treatment of hemoptysis. 4) Tarceva 100 mg by mouth daily started 02/19/2015 status post 13 months of treatment. This was discontinued in May 2018 after the patient had disease progression and development of T790M resistant mutation.  5) palliative stereotactic radiotherapy to the left adrenal gland metastatic lesion under the care of Dr. Lisbeth Renshaw completed on April 10, 2017.  CURRENT THERAPY: 1) Tagrisso 80 mg by mouth daily started 06/02/2016.  Status post 15 months of treatment. 2) Xgeva 120 g subcutaneously on monthly basis.  INTERVAL HISTORY: Deborah Foster 47 y.o. female returns to the clinic today for follow-up visit.  Her [place] interpreter available by the video conference.  The patient is feeling fine today with no specific complaints except for mild fatigue and heartburns after eating.  She denied having any recent weight loss or night sweats.  She has no nausea, vomiting, diarrhea or constipation.  She denied having any chest pain, shortness of breath, cough or hemoptysis.  She has no fever or chills.  She denied having any headache or visual changes.  She has been tolerating her treatment with Tagrisso fairly well.  She had repeat CT scan of the chest, abdomen and pelvis  performed recently and she is here for evaluation and discussion of her risk her results.  MEDICAL HISTORY: Past Medical History:  Diagnosis Date  . Bone metastasis ([address])   . Hemoptysis   . Hypokalemia   . lung ca dx'd 07/2014  . Lung mass   . Metastasis to adrenal gland ([place])   . Metastasis to brain ([place])   . Pneumonia   . Radiation 08/23/14-09/07/14   Brain/chest and left hip 30 Gy 12 Fx  . URI (upper respiratory infection) 02-02-2015    ALLERGIES:  is allergic to other and doxycycline.  MEDICATIONS:  Current Outpatient Medications  Medication Sig Dispense Refill  . AMBULATORY NON FORMULARY MEDICATION Medication Name: MAGIC MOUTHWASH 2 % Viscous Lidocaine  Maalox Benadryl Susp.  Disp: 1:1:1 261m bottle Sig: 173mPO Swish & Spit  q 3-4hrs. PRN mouth sores (Patient not taking: Reported on 07/29/2017) 200 mL 3  . benzonatate (TESSALON) 100 MG capsule Take 1 capsule (100 mg total) by mouth every 8 (eight) hours as needed. 20 capsule 0  . Calcium Carbonate-Vitamin D (CALCIUM 500 + D) 500-125 MG-UNIT TABS Take 500 mg by mouth 2 (two) times daily.    . chlorpheniramine-HYDROcodone (TUSSIONEX) 10-8 MG/5ML SUER Take 5 mLs by mouth every 12 (twelve) hours as needed. 140 mL 0  . clindamycin (CLINDAGEL) 1 % gel Apply 1 application topically 2 (two) times daily.    . Cyanocobalamin (VITAMIN B-12) 2500 MCG SUBL Place 2,500 mcg under the tongue daily.    . Marland Kitchenextromethorphan Polistirex (DELSYM PO) Take by mouth.    . Marland KitchenNSURE (ENSURE) Take 1 Can  by mouth 2 (two) times daily between meals.     . ferrous sulfate 325 (65 FE) MG EC tablet Take 325 mg by mouth daily with breakfast.    . fluticasone (VERAMYST) 27.5 MCG/SPRAY nasal spray Place 2 sprays into the nose daily.    . folic acid (FOLVITE) 292 MCG tablet Take 1 tablet (800 mcg total) by mouth daily. 30 tablet 1  . loratadine (CLARITIN) 10 MG tablet Take 1 tablet (10 mg total) by mouth daily. 30 tablet 0  . Nutritional Supplements (JUICE PLUS  FIBRE PO) Take 1 capsule by mouth daily. Reported on 07/28/2015    . osimertinib mesylate (TAGRISSO) 80 MG tablet Take 1 tablet (80 mg total) by mouth daily. 30 tablet 2  . OVER THE COUNTER MEDICATION "Deep penetrating pain relief oil"    . prochlorperazine (COMPAZINE) 10 MG tablet Take 1 tablet (10 mg total) by mouth every 6 (six) hours as needed for nausea or vomiting. 30 tablet 0  . ranitidine (ZANTAC) 300 MG tablet Take 1 tablet (300 mg total) by mouth at bedtime. 30 tablet 6  . TURMERIC PO Take 1 capsule by mouth 2 (two) times daily. Turmeric Superior 750 mg po bid     No current facility-administered medications for this visit.     SURGICAL HISTORY:  Past Surgical History:  Procedure Laterality Date  . IR GENERIC HISTORICAL  07/14/2015   IR RADIOLOGIST EVAL & MGMT 07/14/2015 Greggory Keen, MD [facility] INTERV RAD  . VIDEO BRONCHOSCOPY Bilateral 08/09/2014   Procedure: VIDEO BRONCHOSCOPY WITHOUT FLUORO;  Surgeon: [name], MD;  Location: [facility] ENDOSCOPY;  Service: Cardiopulmonary;  Laterality: Bilateral;  . VIDEO BRONCHOSCOPY Bilateral 07/06/2015   Procedure: VIDEO BRONCHOSCOPY WITHOUT FLUORO;  Surgeon: [name], MD;  Location: [facility] ENDOSCOPY;  Service: Cardiopulmonary;  Laterality: Bilateral;    REVIEW OF SYSTEMS:  Constitutional: negative Eyes: negative Ears, nose, mouth, throat, and face: negative Respiratory: positive for cough Cardiovascular: negative Gastrointestinal: positive for dyspepsia Genitourinary:negative Integument/breast: negative Hematologic/lymphatic: negative Musculoskeletal:negative Neurological: negative Behavioral/Psych: negative Endocrine: negative Allergic/Immunologic: negative   PHYSICAL EXAMINATION: General appearance: alert, cooperative and no distress Head: Normocephalic, without obvious abnormality, atraumatic Neck: no adenopathy, no JVD, supple, symmetrical, trachea midline and thyroid not enlarged, symmetric, no  tenderness/mass/nodules Lymph nodes: Cervical, supraclavicular, and axillary nodes normal. Resp: clear to auscultation bilaterally Back: symmetric, no curvature. ROM normal. No CVA tenderness. Cardio: regular rate and rhythm, S1, S2 normal, no murmur, click, rub or gallop GI: soft, non-tender; bowel sounds normal; no masses,  no organomegaly Extremities: extremities normal, atraumatic, no cyanosis or edema Neurologic: Alert and oriented X 3, normal strength and tone. Normal symmetric reflexes. Normal coordination and gait  ECOG PERFORMANCE STATUS: 1 - Symptomatic but completely ambulatory  Blood pressure 113/72, pulse (!) 101, temperature 97.7 F (36.5 C), temperature source Oral, resp. rate (!) 22, height _0  (1.499 m), weight 107 lb 6.4 oz (48.7 kg), SpO2 100 %.  LABORATORY DATA: Lab Results  Component Value Date   WBC 4.5 09/02/2017   HGB 14.0 09/02/2017   HCT 41.4 09/02/2017   MCV 95.0 09/02/2017   PLT 272 09/02/2017      Chemistry      Component Value Date/Time   NA 141 09/02/2017 0736   NA 140 01/17/2017 1003   K 4.6 09/02/2017 0736   K 3.9 01/17/2017 1003   CL 104 09/02/2017 0736   CO2 28 09/02/2017 0736   CO2 29 01/17/2017 1003   BUN 18 09/02/2017 0736  BUN 14.8 01/17/2017 1003   CREATININE 0.68 09/02/2017 0736   CREATININE 0.67 07/24/2017 0832   CREATININE 0.8 01/17/2017 1003      Component Value Date/Time   CALCIUM 9.1 09/02/2017 0736   CALCIUM 8.7 01/17/2017 1003   ALKPHOS 62 09/02/2017 0736   ALKPHOS 50 01/17/2017 1003   AST 25 09/02/2017 0736   AST 23 07/24/2017 0832   AST 23 01/17/2017 1003   ALT 38 09/02/2017 0736   ALT 30 07/24/2017 0832   ALT 27 01/17/2017 1003   BILITOT 0.2 (L) 09/02/2017 0736   BILITOT 0.3 07/24/2017 0832   BILITOT 0.46 01/17/2017 1003       RADIOGRAPHIC STUDIES: Ct Chest W Contrast  Result Date: 09/02/2017 CLINICAL DATA:  Metastatic non-small cell lung cancer, oral chemotherapy ongoing, XRT complete. Cough,  dysphagia solids, left lower quadrant pain, nausea, weight loss. EXAM: CT CHEST, ABDOMEN, AND PELVIS WITH CONTRAST TECHNIQUE: Multidetector CT imaging of the chest, abdomen and pelvis was performed following the standard protocol during bolus administration of intravenous contrast. CONTRAST:  56m OMNIPAQUE IOHEXOL 300 MG/ML  SOLN COMPARISON:  06/07/2017 FINDINGS: CT CHEST FINDINGS Cardiovascular: Heart is normal in size.  No pericardial effusion. No evidence of thoracic aortic aneurysm. Mediastinum/Nodes: No suspicious mediastinal lymphadenopathy. Visualized thyroid is unremarkable. Lungs/Pleura: Radiation changes in the medial right lower paramediastinal region with associated volume loss. No suspicious pulmonary nodules. Stable 3 mm nodule in the left lower lobe (series 6/image 111). No focal consolidation. No pleural effusion or pneumothorax. Musculoskeletal: Stable multifocal sclerotic metastases, particularly involving the sternum, bilateral clavicles (right greater than left, left scapula, multiple ribs, and multiple thoracic vertebral bodies. This appearance is unchanged. CT ABDOMEN PELVIS FINDINGS Hepatobiliary: Liver is grossly unremarkable. No suspicious hepatic lesions. Tiny left hepatic lobe cysts. Gallbladder is unremarkable. Mild intrahepatic and extrahepatic ductal dilatation. Common duct measures 11 mm, unchanged. Within normal limits. Pancreas: Within normal limits. Spleen: Within normal limits. Adrenals/Urinary Tract: Adrenal glands are within normal limits. Prior left adrenal metastasis has essentially resolved. Kidneys are within normal limits. No hydronephrosis. Bladder is within normal limits. Stomach/Bowel: Stomach is within normal limits. No evidence of bowel obstruction. Normal appendix (series 2/image 85). Vascular/Lymphatic: No evidence of abdominal aortic aneurysm. No suspicious abdominopelvic lymphadenopathy. Reproductive: Uterus is within normal limits. Bilateral ovaries are  unremarkable. Other: No abdominopelvic ascites. Musculoskeletal: Stable multifocal sclerotic metastases, stricture involving multiple lumbar vertebral bodies and left greater than right pelvis, with expansile sclerosis of the left superior acetabulum. This appearance is unchanged. IMPRESSION: Radiation changes involving the right paramediastinal lung. Left adrenal metastasis has resolved. Multifocal sclerotic osseous metastases throughout the visualized axial and appendicular skeleton, unchanged. No evidence of new/progressive metastatic disease. Electronically Signed   By: SJulian HyM.D.   On: 09/02/2017 09:51   Ct Abdomen Pelvis W Contrast  Result Date: 09/02/2017 CLINICAL DATA:  Metastatic non-small cell lung cancer, oral chemotherapy ongoing, XRT complete. Cough, dysphagia solids, left lower quadrant pain, nausea, weight loss. EXAM: CT CHEST, ABDOMEN, AND PELVIS WITH CONTRAST TECHNIQUE: Multidetector CT imaging of the chest, abdomen and pelvis was performed following the standard protocol during bolus administration of intravenous contrast. CONTRAST:  889mOMNIPAQUE IOHEXOL 300 MG/ML  SOLN COMPARISON:  06/07/2017 FINDINGS: CT CHEST FINDINGS Cardiovascular: Heart is normal in size.  No pericardial effusion. No evidence of thoracic aortic aneurysm. Mediastinum/Nodes: No suspicious mediastinal lymphadenopathy. Visualized thyroid is unremarkable. Lungs/Pleura: Radiation changes in the medial right lower paramediastinal region with associated volume loss. No suspicious pulmonary nodules. Stable 3 mm nodule  in the left lower lobe (series 6/image 111). No focal consolidation. No pleural effusion or pneumothorax. Musculoskeletal: Stable multifocal sclerotic metastases, particularly involving the sternum, bilateral clavicles (right greater than left, left scapula, multiple ribs, and multiple thoracic vertebral bodies. This appearance is unchanged. CT ABDOMEN PELVIS FINDINGS Hepatobiliary: Liver is grossly  unremarkable. No suspicious hepatic lesions. Tiny left hepatic lobe cysts. Gallbladder is unremarkable. Mild intrahepatic and extrahepatic ductal dilatation. Common duct measures 11 mm, unchanged. Within normal limits. Pancreas: Within normal limits. Spleen: Within normal limits. Adrenals/Urinary Tract: Adrenal glands are within normal limits. Prior left adrenal metastasis has essentially resolved. Kidneys are within normal limits. No hydronephrosis. Bladder is within normal limits. Stomach/Bowel: Stomach is within normal limits. No evidence of bowel obstruction. Normal appendix (series 2/image 85). Vascular/Lymphatic: No evidence of abdominal aortic aneurysm. No suspicious abdominopelvic lymphadenopathy. Reproductive: Uterus is within normal limits. Bilateral ovaries are unremarkable. Other: No abdominopelvic ascites. Musculoskeletal: Stable multifocal sclerotic metastases, stricture involving multiple lumbar vertebral bodies and left greater than right pelvis, with expansile sclerosis of the left superior acetabulum. This appearance is unchanged. IMPRESSION: Radiation changes involving the right paramediastinal lung. Left adrenal metastasis has resolved. Multifocal sclerotic osseous metastases throughout the visualized axial and appendicular skeleton, unchanged. No evidence of new/progressive metastatic disease. Electronically Signed   By: Julian Hy M.D.   On: 09/02/2017 09:51    ASSESSMENT AND PLAN:  This is a very pleasant 47 years old Asian female with stage IV non-small cell lung cancer, adenocarcinoma with positive EGFR mutation with deletion in exon 54 diagnosed in July 2016 status post treatment with Tarceva for a total of 18 months but this was discontinued secondary to disease progression and development of T790M resistant mutation. The patient was started on treatment with Tagrisso 80 mg by mouth daily status post 15 months of treatment. The patient has been tolerating her treatment fairly  well with no concerning complaints. CT scan of the chest, abdomen and pelvis that showed no concerning findings for disease progression and there was resolution of the left adrenal metastasis. I personally and independently reviewed the scans and discussed the results with the patient today. I recommended for her to continue her current treatment with Tagrisso. I will see the patient back for follow-up visit in 6 weeks for evaluation with repeat blood work. She will continue her current treatment with Xgeva every 6 weeks. The patient was advised to call immediately if she has any concerning symptoms in the interval. The patient voices understanding of current disease status and treatment options and is in agreement with the current care plan. All questions were answered. The patient knows to call the clinic with any problems, questions or concerns. We can certainly see the patient much sooner if necessary.  Disclaimer: This note was dictated with voice recognition software. Similar sounding words can inadvertently be transcribed and may not be corrected upon review.

## 2017-09-24 MED FILL — TAGRISSO 80 MG TABLET: 80 | 30 days supply | Qty: 30 | Fill #3

## 2017-10-08 ENCOUNTER — Telehealth: Payer: Self-pay

## 2017-10-08 NOTE — Telephone Encounter (Signed)
Oral Oncology Patient Advocate Encounter  [facility] have updated their system and are now having major issues. The patients and [place] here at the office are sitting on the phone between 1-3 hours to talk to someone.   When I got them on the phone today I requested refills for all of our [facility] patients and they will expedite the refill process and overnight [name].  I called the patient and left a [place] Patient [place] Phone [phone] Fax [phone]

## 2017-10-16 ENCOUNTER — Encounter: Payer: Self-pay | Admitting: Internal Medicine

## 2017-10-16 ENCOUNTER — Inpatient Hospital Stay: Payer: Medicare Other

## 2017-10-16 ENCOUNTER — Telehealth: Payer: Self-pay | Admitting: Internal Medicine

## 2017-10-16 ENCOUNTER — Inpatient Hospital Stay: Payer: Medicare Other | Attending: Internal Medicine | Admitting: Internal Medicine

## 2017-10-16 VITALS — BP 110/69 | HR 102 | Temp 98.0°F | Resp 17 | Ht 59.0 in | Wt 110.0 lb

## 2017-10-16 DIAGNOSIS — Z9221 Personal history of antineoplastic chemotherapy: Secondary | ICD-10-CM | POA: Insufficient documentation

## 2017-10-16 DIAGNOSIS — R05 Cough: Secondary | ICD-10-CM | POA: Diagnosis not present

## 2017-10-16 DIAGNOSIS — C7931 Secondary malignant neoplasm of brain: Secondary | ICD-10-CM | POA: Diagnosis not present

## 2017-10-16 DIAGNOSIS — C3431 Malignant neoplasm of lower lobe, right bronchus or lung: Secondary | ICD-10-CM

## 2017-10-16 DIAGNOSIS — C342 Malignant neoplasm of middle lobe, bronchus or lung: Secondary | ICD-10-CM | POA: Diagnosis not present

## 2017-10-16 DIAGNOSIS — M9080 Osteopathy in diseases classified elsewhere, unspecified site: Secondary | ICD-10-CM

## 2017-10-16 DIAGNOSIS — C7951 Secondary malignant neoplasm of bone: Secondary | ICD-10-CM | POA: Insufficient documentation

## 2017-10-16 DIAGNOSIS — Z923 Personal history of irradiation: Secondary | ICD-10-CM

## 2017-10-16 DIAGNOSIS — R0609 Other forms of dyspnea: Secondary | ICD-10-CM | POA: Diagnosis not present

## 2017-10-16 DIAGNOSIS — C3491 Malignant neoplasm of unspecified part of right bronchus or lung: Secondary | ICD-10-CM

## 2017-10-16 DIAGNOSIS — Z79899 Other long term (current) drug therapy: Secondary | ICD-10-CM | POA: Diagnosis not present

## 2017-10-16 DIAGNOSIS — C3490 Malignant neoplasm of unspecified part of unspecified bronchus or lung: Secondary | ICD-10-CM

## 2017-10-16 DIAGNOSIS — C7972 Secondary malignant neoplasm of left adrenal gland: Secondary | ICD-10-CM | POA: Insufficient documentation

## 2017-10-16 DIAGNOSIS — Z5111 Encounter for antineoplastic chemotherapy: Secondary | ICD-10-CM

## 2017-10-16 DIAGNOSIS — E889 Metabolic disorder, unspecified: Secondary | ICD-10-CM

## 2017-10-16 DIAGNOSIS — M898X9 Other specified disorders of bone, unspecified site: Secondary | ICD-10-CM

## 2017-10-16 LAB — CBC WITH DIFFERENTIAL (CANCER CENTER ONLY)
BASOS ABS: 0 10*3/uL (ref 0.0–0.1)
Basophils Relative: 0 %
EOS ABS: 0.1 10*3/uL (ref 0.0–0.5)
EOS PCT: 2 %
HCT: 39.3 % (ref 34.8–46.6)
Hemoglobin: 13.3 g/dL (ref 11.6–15.9)
LYMPHS PCT: 13 %
Lymphs Abs: 0.7 10*3/uL — ABNORMAL LOW (ref 0.9–3.3)
MCH: 32.2 pg (ref 25.1–34.0)
MCHC: 33.8 g/dL (ref 31.5–36.0)
MCV: 95.1 fL (ref 79.5–101.0)
Monocytes Absolute: 0.5 10*3/uL (ref 0.1–0.9)
Monocytes Relative: 10 %
Neutro Abs: 3.9 10*3/uL (ref 1.5–6.5)
Neutrophils Relative %: 75 %
Platelet Count: 294 10*3/uL (ref 145–400)
RBC: 4.13 MIL/uL (ref 3.70–5.45)
RDW: 13.8 % (ref 11.2–14.5)
WBC: 5.2 10*3/uL (ref 3.9–10.3)

## 2017-10-16 LAB — CMP (CANCER CENTER ONLY)
ALT: 34 U/L (ref 0–44)
AST: 28 U/L (ref 15–41)
Albumin: 3.7 g/dL (ref 3.5–5.0)
Alkaline Phosphatase: 54 U/L (ref 38–126)
Anion gap: 8 (ref 5–15)
BILIRUBIN TOTAL: 0.4 mg/dL (ref 0.3–1.2)
BUN: 13 mg/dL (ref 6–20)
CO2: 26 mmol/L (ref 22–32)
CREATININE: 0.71 mg/dL (ref 0.44–1.00)
Calcium: 8.8 mg/dL — ABNORMAL LOW (ref 8.9–10.3)
Chloride: 103 mmol/L (ref 98–111)
GFR, Est AFR Am: >60 mL/min (ref >60)
Glucose, Bld: 85 mg/dL (ref 70–99)
POTASSIUM: 4.4 mmol/L (ref 3.5–5.1)
Sodium: 137 mmol/L (ref 135–145)
TOTAL PROTEIN: 6.8 g/dL (ref 6.5–8.1)

## 2017-10-16 MED ORDER — DENOSUMAB 120 MG/1.7ML ~~LOC~~ SOLN
120.0000 mg | Freq: Once | SUBCUTANEOUS | Status: AC
  Administered 2017-10-16: 120 mg via SUBCUTANEOUS

## 2017-10-16 NOTE — Progress Notes (Signed)
[place] Telephone:(336) [phone]   Fax:(336) [phone]  OFFICE PROGRESS NOTE  [name], MD [place] [address]  DIAGNOSIS: Stage IV (T2a, N1, M1b) non-small cell lung cancer, adenocarcinoma with positive EGFR mutation (deletion 55) presented with right middle lobe lung mass, right hilar adenopathy as well as metastatic disease to the bone, brain and left adrenal diagnosed in July 2016.  PRIOR THERAPY: 1) palliative radiotherapy to the brain as well as metastatic bone lesions in the pelvis. 2) Tarceva 150 mg by mouth daily started on 09/25/2014. Status post 5 months of treatment. 3) status post palliative radiotherapy to the right middle lobe lung mass for treatment of hemoptysis. 4) Tarceva 100 mg by mouth daily started 02/19/2015 status post 13 months of treatment. This was discontinued in May 2018 after the patient had disease progression and development of T790M resistant mutation.  5) palliative stereotactic radiotherapy to the left adrenal gland metastatic lesion under the care of Dr. Lisbeth Renshaw completed on April 10, 2017.  CURRENT THERAPY: 1) Tagrisso 80 mg by mouth daily started 06/02/2016.  Status post 16 months of treatment. 2) Xgeva 120 g subcutaneously every 6 weeks.  INTERVAL HISTORY: Deborah Foster 47 y.o. female returns to the clinic today for follow-up visit.  The patient is feeling fine today with no specific complaints except for the persistent dry cough and she is using Delsym over-the-counter.  She also has intermittent shortness of breath with exertion.  She denied having any chest pain or hemoptysis.  She denied having any fever or chills.  She has no nausea, vomiting, diarrhea or constipation.  She denied having any skin rash.  She is still under a lot of stress at home because of disagreement with her husband.  She denied having any safety issues at home.  The patient is here today for evaluation and repeat blood  work.  MEDICAL HISTORY: Past Medical History:  Diagnosis Date  . Bone metastasis ([place])   . Hemoptysis   . Hypokalemia   . lung ca dx'd 07/2014  . Lung mass   . Metastasis to adrenal gland ([place])   . Metastasis to brain ([place])   . Pneumonia   . Radiation 08/23/14-09/07/14   Brain/chest and left hip 30 Gy 12 Fx  . URI (upper respiratory infection) 02-02-2015    ALLERGIES:  is allergic to other and doxycycline.  MEDICATIONS:  Current Outpatient Medications  Medication Sig Dispense Refill  . benzonatate (TESSALON) 100 MG capsule Take 1 capsule (100 mg total) by mouth every 8 (eight) hours as needed. 20 capsule 0  . Calcium Carbonate-Vitamin D (CALCIUM 500 + D) 500-125 MG-UNIT TABS Take 500 mg by mouth 2 (two) times daily.    . Cyanocobalamin (VITAMIN B-12) 2500 MCG SUBL Place 2,500 mcg under the tongue daily.    Marland Kitchen Dextromethorphan Polistirex (DELSYM PO) Take by mouth.    . ENSURE (ENSURE) Take 1 Can by mouth 2 (two) times daily between meals.     . ferrous sulfate 325 (65 FE) MG EC tablet Take 325 mg by mouth daily with breakfast.    . fluticasone (VERAMYST) 27.5 MCG/SPRAY nasal spray Place 2 sprays into the nose daily.    . folic acid (FOLVITE) 177 MCG tablet Take 1 tablet (800 mcg total) by mouth daily. 30 tablet 1  . loratadine (CLARITIN) 10 MG tablet Take 1 tablet (10 mg total) by mouth daily. 30 tablet 0  . AMBULATORY NON FORMULARY MEDICATION  Medication Name: MAGIC MOUTHWASH 2 % Viscous Lidocaine  Maalox Benadryl Susp.  Disp: 1:1:1 276m bottle Sig: 166mPO Swish & Spit  q 3-4hrs. PRN mouth sores (Patient not taking: Reported on 07/29/2017) 200 mL 3  . chlorpheniramine-HYDROcodone (TUSSIONEX) 10-8 MG/5ML SUER Take 5 mLs by mouth every 12 (twelve) hours as needed. 140 mL 0  . clindamycin (CLINDAGEL) 1 % gel Apply 1 application topically 2 (two) times daily.    . Nutritional Supplements (JUICE PLUS FIBRE PO) Take 1 capsule by mouth daily. Reported on 07/28/2015    . osimertinib  mesylate (TAGRISSO) 80 MG tablet Take 1 tablet (80 mg total) by mouth daily. 30 tablet 2  . OVER THE COUNTER MEDICATION "Deep penetrating pain relief oil"    . prochlorperazine (COMPAZINE) 10 MG tablet Take 1 tablet (10 mg total) by mouth every 6 (six) hours as needed for nausea or vomiting. (Patient not taking: Reported on 10/16/2017) 30 tablet 0  . ranitidine (ZANTAC) 300 MG tablet Take 1 tablet (300 mg total) by mouth at bedtime. 30 tablet 6  . TURMERIC PO Take 1 capsule by mouth 2 (two) times daily. Turmeric Superior 750 mg po bid     No current facility-administered medications for this visit.     SURGICAL HISTORY:  Past Surgical History:  Procedure Laterality Date  . IR GENERIC HISTORICAL  07/14/2015   IR RADIOLOGIST EVAL & MGMT 07/14/2015 MiGreggory KeenMD [facility] INTERV RAD  . VIDEO BRONCHOSCOPY Bilateral 08/09/2014   Procedure: VIDEO BRONCHOSCOPY WITHOUT FLUORO;  Surgeon: DoJuanito DoomMD;  [facility];  Service: Cardiopulmonary;  Laterality: Bilateral;  . VIDEO BRONCHOSCOPY Bilateral 07/06/2015   Procedure: VIDEO BRONCHOSCOPY WITHOUT FLUORO;  Surgeon: DoJuanito DoomMD;  Location: [facility] ENDOSCOPY;  Service: Cardiopulmonary;  Laterality: Bilateral;    REVIEW OF SYSTEMS:  A comprehensive review of systems was negative except for: Respiratory: positive for cough and dyspnea on exertion   PHYSICAL EXAMINATION: General appearance: alert, cooperative and no distress Head: Normocephalic, without obvious abnormality, atraumatic Neck: no adenopathy, no JVD, supple, symmetrical, trachea midline and thyroid not enlarged, symmetric, no tenderness/mass/nodules Lymph nodes: Cervical, supraclavicular, and axillary nodes normal. Resp: clear to auscultation bilaterally Back: symmetric, no curvature. ROM normal. No CVA tenderness. Cardio: regular rate and rhythm, S1, S2 normal, no murmur, click, rub or gallop GI: soft, non-tender; bowel sounds normal; no masses,  no  organomegaly Extremities: extremities normal, atraumatic, no cyanosis or edema  ECOG PERFORMANCE STATUS: 1 - Symptomatic but completely ambulatory  Blood pressure 110/69, pulse (!) 102, temperature 98 F (36.7 C), temperature source Oral, resp. rate 17, height _0  (1.499 m), weight 110 lb (49.9 kg), SpO2 100 %.  LABORATORY DATA: Lab Results  Component Value Date   WBC 5.2 10/16/2017   HGB 13.3 10/16/2017   HCT 39.3 10/16/2017   MCV 95.1 10/16/2017   PLT 294 10/16/2017      Chemistry      Component Value Date/Time   NA 137 10/16/2017 0852   NA 140 01/17/2017 1003   K 4.4 10/16/2017 0852   K 3.9 01/17/2017 1003   CL 103 10/16/2017 0852   CO2 26 10/16/2017 0852   CO2 29 01/17/2017 1003   BUN 13 10/16/2017 0852   BUN 14.8 01/17/2017 1003   CREATININE 0.71 10/16/2017 0852   CREATININE 0.8 01/17/2017 1003      Component Value Date/Time   CALCIUM 8.8 (L) 10/16/2017 0852   CALCIUM 8.7 01/17/2017 1003   ALKPHOS 54 10/16/2017  0852   ALKPHOS 50 01/17/2017 1003   AST 28 10/16/2017 0852   AST 23 01/17/2017 1003   ALT 34 10/16/2017 0852   ALT 27 01/17/2017 1003   BILITOT 0.4 10/16/2017 0852   BILITOT 0.46 01/17/2017 1003       RADIOGRAPHIC STUDIES: No results found.  ASSESSMENT AND PLAN:  This is a very pleasant 47 years old Asian female with stage IV non-small cell lung cancer, adenocarcinoma with positive EGFR mutation with deletion in exon 75 diagnosed in July 2016 status post treatment with Tarceva for a total of 18 months but this was discontinued secondary to disease progression and development of T790M resistant mutation. The patient was started on treatment with Tagrisso 80 mg by mouth daily status post 16 months of treatment. She continues to tolerate this treatment well with no concerning adverse effects. I recommended for her to continue her current treatment with Tagrisso with the same dose. For the metastatic bone disease, she will continue her current  treatment with Xgeva every 6 weeks.  I will see her back for follow-up visit in 6 weeks for evaluation with repeat CT scan of the chest, abdomen and pelvis for restaging of her disease. For the social issues she has an appointment with a Education officer, museum at the cancer center next week. The patient was advised to call immediately if she has any concerning symptoms in the interval. The patient voices understanding of current disease status and treatment options and is in agreement with the current care plan. All questions were answered. The patient knows to call the clinic with any problems, questions or concerns. We can certainly see the patient much sooner if necessary.  Disclaimer: This note was dictated with voice recognition software. Similar sounding words can inadvertently be transcribed and may not be corrected upon review.

## 2017-10-16 NOTE — Telephone Encounter (Signed)
Appts scheduled avs/calendar printed / contrast provided per 10/2 los

## 2017-10-16 NOTE — Patient Instructions (Signed)
Denosumab injection  What is this medicine?  DENOSUMAB (den oh sue mab) slows bone breakdown. Prolia is used to treat osteoporosis in women after menopause and in men. Xgeva is used to prevent bone fractures and other bone problems caused by cancer bone metastases. Xgeva is also used to treat giant cell tumor of the bone.  This medicine may be used for other purposes; ask your health care provider or pharmacist if you have questions.  What should I tell my health care provider before I take this medicine?  They need to know if you have any of these conditions:  -dental disease  -eczema  -infection or history of infections  -kidney disease or on dialysis  -low blood calcium or vitamin D  -malabsorption syndrome  -scheduled to have surgery or tooth extraction  -taking medicine that contains denosumab  -thyroid or parathyroid disease  -an unusual reaction to denosumab, other medicines, foods, dyes, or preservatives  -pregnant or trying to get pregnant  -breast-feeding  How should I use this medicine?  This medicine is for injection under the skin. It is given by a health care professional in a hospital or clinic setting.  If you are getting Prolia, a special MedGuide will be given to you by the pharmacist with each prescription and refill. Be sure to read this information carefully each time.  For Prolia, talk to your pediatrician regarding the use of this medicine in children. Special care may be needed. For Xgeva, talk to your pediatrician regarding the use of this medicine in children. While this drug may be prescribed for children as young as 13 years for selected conditions, precautions do apply.  Overdosage: If you think you have taken too much of this medicine contact a poison control center or emergency room at once.  NOTE: This medicine is only for you. Do not share this medicine with others.  What if I miss a dose?  It is important not to miss your dose. Call your doctor or health care professional if you are  unable to keep an appointment.  What may interact with this medicine?  Do not take this medicine with any of the following medications:  -other medicines containing denosumab  This medicine may also interact with the following medications:  -medicines that suppress the immune system  -medicines that treat cancer  -steroid medicines like prednisone or cortisone  This list may not describe all possible interactions. Give your health care provider a list of all the medicines, herbs, non-prescription drugs, or dietary supplements you use. Also tell them if you smoke, drink alcohol, or use illegal drugs. Some items may interact with your medicine.  What should I watch for while using this medicine?  Visit your doctor or health care professional for regular checks on your progress. Your doctor or health care professional may order blood tests and other tests to see how you are doing.  Call your doctor or health care professional if you get a cold or other infection while receiving this medicine. Do not treat yourself. This medicine may decrease your body's ability to fight infection.  You should make sure you get enough calcium and vitamin D while you are taking this medicine, unless your doctor tells you not to. Discuss the foods you eat and the vitamins you take with your health care professional.  See your dentist regularly. Brush and floss your teeth as directed. Before you have any dental work done, tell your dentist you are receiving this medicine.  Do   not become pregnant while taking this medicine or for 5 months after stopping it. Women should inform their doctor if they wish to become pregnant or think they might be pregnant. There is a potential for serious side effects to an unborn child. Talk to your health care professional or pharmacist for more information.  What side effects may I notice from receiving this medicine?  Side effects that you should report to your doctor or health care professional as soon as  possible:  -allergic reactions like skin rash, itching or hives, swelling of the face, lips, or tongue  -breathing problems  -chest pain  -fast, irregular heartbeat  -feeling faint or lightheaded, falls  -fever, chills, or any other sign of infection  -muscle spasms, tightening, or twitches  -numbness or tingling  -skin blisters or bumps, or is dry, peels, or red  -slow healing or unexplained pain in the mouth or jaw  -unusual bleeding or bruising  Side effects that usually do not require medical attention (Report these to your doctor or health care professional if they continue or are bothersome.):  -muscle pain  -stomach upset, gas  This list may not describe all possible side effects. Call your doctor for medical advice about side effects. You may report side effects to FDA at 1-800-FDA-1088.  Where should I keep my medicine?  This medicine is only given in a clinic, doctor's office, or other health care setting and will not be stored at home.  NOTE: This sheet is a summary. It may not cover all possible information. If you have questions about this medicine, talk to your doctor, pharmacist, or health care provider.      2016, Elsevier/Gold Standard. (2011-07-02 12:37:47)

## 2017-10-22 MED FILL — TAGRISSO 80 MG TABLET: 80 | 30 days supply | Qty: 30 | Fill #4

## 2017-11-21 MED FILL — TAGRISSO 80 MG TABLET: 80 | 30 days supply | Qty: 30 | Fill #5

## 2017-11-25 ENCOUNTER — Ambulatory Visit (HOSPITAL_COMMUNITY)
Admission: RE | Admit: 2017-11-25 | Discharge: 2017-11-25 | Disposition: A | Discharge status: Home / Self Care | Payer: Medicare Other | Source: Ambulatory Visit | Attending: Internal Medicine | Admitting: Internal Medicine

## 2017-11-25 ENCOUNTER — Inpatient Hospital Stay: Payer: Medicare Other | Attending: Internal Medicine

## 2017-11-25 ENCOUNTER — Encounter (HOSPITAL_COMMUNITY): Payer: Self-pay

## 2017-11-25 DIAGNOSIS — C7931 Secondary malignant neoplasm of brain: Secondary | ICD-10-CM | POA: Diagnosis not present

## 2017-11-25 DIAGNOSIS — C3490 Malignant neoplasm of unspecified part of unspecified bronchus or lung: Secondary | ICD-10-CM | POA: Diagnosis not present

## 2017-11-25 DIAGNOSIS — C7972 Secondary malignant neoplasm of left adrenal gland: Secondary | ICD-10-CM | POA: Diagnosis not present

## 2017-11-25 DIAGNOSIS — R200 Anesthesia of skin: Secondary | ICD-10-CM | POA: Diagnosis not present

## 2017-11-25 DIAGNOSIS — C7951 Secondary malignant neoplasm of bone: Secondary | ICD-10-CM | POA: Insufficient documentation

## 2017-11-25 DIAGNOSIS — Z9221 Personal history of antineoplastic chemotherapy: Secondary | ICD-10-CM | POA: Insufficient documentation

## 2017-11-25 DIAGNOSIS — R590 Localized enlarged lymph nodes: Secondary | ICD-10-CM | POA: Diagnosis not present

## 2017-11-25 DIAGNOSIS — C342 Malignant neoplasm of middle lobe, bronchus or lung: Secondary | ICD-10-CM | POA: Diagnosis present

## 2017-11-25 DIAGNOSIS — Z79899 Other long term (current) drug therapy: Secondary | ICD-10-CM | POA: Diagnosis not present

## 2017-11-25 DIAGNOSIS — R05 Cough: Secondary | ICD-10-CM | POA: Insufficient documentation

## 2017-11-25 DIAGNOSIS — Z923 Personal history of irradiation: Secondary | ICD-10-CM | POA: Diagnosis not present

## 2017-11-25 LAB — CMP (CANCER CENTER ONLY)
ALK PHOS: 46 U/L (ref 38–126)
ALT: 23 U/L (ref 0–44)
ANION GAP: 9 (ref 5–15)
AST: 21 U/L (ref 15–41)
Albumin: 3.7 g/dL (ref 3.5–5.0)
BILIRUBIN TOTAL: 0.5 mg/dL (ref 0.3–1.2)
BUN: 9 mg/dL (ref 6–20)
CALCIUM: 9.2 mg/dL (ref 8.9–10.3)
CO2: 26 mmol/L (ref 22–32)
Chloride: 106 mmol/L (ref 98–111)
Creatinine: 0.73 mg/dL (ref 0.44–1.00)
Glucose, Bld: 80 mg/dL (ref 70–99)
Potassium: 4.4 mmol/L (ref 3.5–5.1)
Sodium: 141 mmol/L (ref 135–145)
TOTAL PROTEIN: 6.9 g/dL (ref 6.5–8.1)

## 2017-11-25 LAB — CBC WITH DIFFERENTIAL (CANCER CENTER ONLY)
Abs Immature Granulocytes: 0.01 10*3/uL (ref 0.00–0.07)
Basophils Absolute: 0 10*3/uL (ref 0.0–0.1)
Basophils Relative: 0 %
EOS PCT: 2 %
Eosinophils Absolute: 0.1 10*3/uL (ref 0.0–0.5)
HEMATOCRIT: 40.9 % (ref 36.0–46.0)
HEMOGLOBIN: 13.3 g/dL (ref 12.0–15.0)
Immature Granulocytes: 0 %
LYMPHS PCT: 20 %
Lymphs Abs: 0.9 10*3/uL (ref 0.7–4.0)
MCH: 32 pg (ref 26.0–34.0)
MCHC: 32.5 g/dL (ref 30.0–36.0)
MCV: 98.3 fL (ref 80.0–100.0)
MONO ABS: 0.6 10*3/uL (ref 0.1–1.0)
Monocytes Relative: 12 %
Neutro Abs: 3.1 10*3/uL (ref 1.7–7.7)
Neutrophils Relative %: 66 %
Platelet Count: 261 10*3/uL (ref 150–400)
RBC: 4.16 MIL/uL (ref 3.87–5.11)
RDW: 13.2 % (ref 11.5–15.5)
WBC: 4.7 10*3/uL (ref 4.0–10.5)
nRBC: 0 % (ref 0.0–0.2)

## 2017-11-25 MED ORDER — SODIUM CHLORIDE (PF) 0.9 % IJ SOLN
INTRAMUSCULAR | Status: AC
  Filled 2017-11-25: qty 50

## 2017-11-25 MED ORDER — IOHEXOL 300 MG/ML  SOLN
100.0000 mL | Freq: Once | INTRAMUSCULAR | Status: AC | PRN
  Administered 2017-11-25: 100 mL via INTRAVENOUS

## 2017-11-27 ENCOUNTER — Encounter: Payer: Self-pay | Admitting: Oncology

## 2017-11-27 ENCOUNTER — Telehealth: Payer: Self-pay | Admitting: Oncology

## 2017-11-27 ENCOUNTER — Inpatient Hospital Stay (HOSPITAL_BASED_OUTPATIENT_CLINIC_OR_DEPARTMENT_OTHER): Payer: Medicare Other | Admitting: Oncology

## 2017-11-27 ENCOUNTER — Inpatient Hospital Stay: Payer: Medicare Other

## 2017-11-27 VITALS — BP 108/49 | HR 103 | Temp 97.7°F | Resp 20 | Ht 59.0 in | Wt 107.4 lb

## 2017-11-27 DIAGNOSIS — C342 Malignant neoplasm of middle lobe, bronchus or lung: Secondary | ICD-10-CM

## 2017-11-27 DIAGNOSIS — R05 Cough: Secondary | ICD-10-CM

## 2017-11-27 DIAGNOSIS — M898X9 Other specified disorders of bone, unspecified site: Secondary | ICD-10-CM

## 2017-11-27 DIAGNOSIS — Z923 Personal history of irradiation: Secondary | ICD-10-CM

## 2017-11-27 DIAGNOSIS — Z9221 Personal history of antineoplastic chemotherapy: Secondary | ICD-10-CM

## 2017-11-27 DIAGNOSIS — C7951 Secondary malignant neoplasm of bone: Secondary | ICD-10-CM

## 2017-11-27 DIAGNOSIS — C7972 Secondary malignant neoplasm of left adrenal gland: Secondary | ICD-10-CM | POA: Diagnosis not present

## 2017-11-27 DIAGNOSIS — C3491 Malignant neoplasm of unspecified part of right bronchus or lung: Secondary | ICD-10-CM

## 2017-11-27 DIAGNOSIS — M9080 Osteopathy in diseases classified elsewhere, unspecified site: Secondary | ICD-10-CM

## 2017-11-27 DIAGNOSIS — R200 Anesthesia of skin: Secondary | ICD-10-CM

## 2017-11-27 DIAGNOSIS — C7931 Secondary malignant neoplasm of brain: Secondary | ICD-10-CM | POA: Diagnosis not present

## 2017-11-27 DIAGNOSIS — E889 Metabolic disorder, unspecified: Secondary | ICD-10-CM

## 2017-11-27 DIAGNOSIS — Z5111 Encounter for antineoplastic chemotherapy: Secondary | ICD-10-CM

## 2017-11-27 DIAGNOSIS — C3431 Malignant neoplasm of lower lobe, right bronchus or lung: Secondary | ICD-10-CM

## 2017-11-27 DIAGNOSIS — Z79899 Other long term (current) drug therapy: Secondary | ICD-10-CM

## 2017-11-27 MED ORDER — DENOSUMAB 120 MG/1.7ML ~~LOC~~ SOLN
120.0000 mg | Freq: Once | SUBCUTANEOUS | Status: AC
  Administered 2017-11-27: 120 mg via SUBCUTANEOUS

## 2017-11-27 MED ORDER — DENOSUMAB 120 MG/1.7ML ~~LOC~~ SOLN
SUBCUTANEOUS | Status: AC
  Filled 2017-11-27: qty 1.7

## 2017-11-27 NOTE — Progress Notes (Signed)
[facility] OFFICE PROGRESS NOTE  [name], MD [place] [address]  DIAGNOSIS: Stage IV (T2a, N1, M1b) non-small cell lung cancer, adenocarcinoma with positive EGFR mutation (deletion 48) presented with right middle lobe lung mass, right hilar adenopathy as well as metastatic disease to the bone, brain and left adrenal diagnosed in July 2016.  PRIOR THERAPY: 1) palliative radiotherapy to the brain as well as metastatic bone lesions in the pelvis. 2) Tarceva 150 mg by mouth daily started on 09/25/2014. Status post 5 months of treatment. 3) status post palliative radiotherapy to the right middle lobe lung mass for treatment of hemoptysis. 4) Tarceva 100 mg by mouth daily started 02/19/2015 status post 13 months of treatment. This was discontinued in May 2018 after the patient had disease progression and development of T790M resistant mutation.  5) palliative stereotactic radiotherapy to the left adrenal gland metastatic lesion under the care of Dr. Lisbeth Renshaw completed on April 10, 2017.  CURRENT THERAPY: 1) Tagrisso 80 mg by mouth daily started 06/02/2016.  Status post 18 months of treatment. 2) Xgeva 120 g subcutaneously every 6 weeks.  INTERVAL HISTORY: Deborah Foster 47 y.o. female returns for routine follow-up visit by herself.  A video interpreter was used to assist in communication.  The patient reports that she is feeling fine today and has no specific complaints except for intermittent numbness in her left lower leg.  She denies fevers and chills.  Denies chest pain, shortness of breath, hemoptysis.  She has an ongoing cough which is unchanged.  She uses Delsym as needed.  Denies nausea, vomiting, constipation, diarrhea.  Denies skin rashes.  She had a restaging CT scan and is here to discuss the results.  MEDICAL HISTORY: Past Medical History:  Diagnosis Date  . Bone metastasis ([place])   . Hemoptysis   . Hypokalemia   . lung ca dx'd  07/2014  . Lung mass   . Metastasis to adrenal gland ([place])   . Metastasis to brain ([place])   . Pneumonia   . Radiation 08/23/14-09/07/14   Brain/chest and left hip 30 Gy 12 Fx  . URI (upper respiratory infection) 02-02-2015    ALLERGIES:  is allergic to other and doxycycline.  MEDICATIONS:  Current Outpatient Medications  Medication Sig Dispense Refill  . AMBULATORY NON FORMULARY MEDICATION Medication Name: MAGIC MOUTHWASH 2 % Viscous Lidocaine  Maalox Benadryl Susp.  Disp: 1:1:1 233m bottle Sig: 149mPO Swish & Spit  q 3-4hrs. PRN mouth sores (Patient not taking: Reported on 07/29/2017) 200 mL 3  . benzonatate (TESSALON) 100 MG capsule Take 1 capsule (100 mg total) by mouth every 8 (eight) hours as needed. 20 capsule 0  . Calcium Carbonate-Vitamin D (CALCIUM 500 + D) 500-125 MG-UNIT TABS Take 500 mg by mouth 2 (two) times daily.    . chlorpheniramine-HYDROcodone (TUSSIONEX) 10-8 MG/5ML SUER Take 5 mLs by mouth every 12 (twelve) hours as needed. 140 mL 0  . clindamycin (CLINDAGEL) 1 % gel Apply 1 application topically 2 (two) times daily.    . Cyanocobalamin (VITAMIN B-12) 2500 MCG SUBL Place 2,500 mcg under the tongue daily.    . Marland Kitchenextromethorphan Polistirex (DELSYM PO) Take by mouth.    . ENSURE (ENSURE) Take 1 Can by mouth 2 (two) times daily between meals.     . ferrous sulfate 325 (65 FE) MG EC tablet Take 325 mg by mouth daily with breakfast.    . fluticasone (VERAMYST) 27.5 MCG/SPRAY nasal spray Place  2 sprays into the nose daily.    . folic acid (FOLVITE) 638 MCG tablet Take 1 tablet (800 mcg total) by mouth daily. 30 tablet 1  . loratadine (CLARITIN) 10 MG tablet Take 1 tablet (10 mg total) by mouth daily. 30 tablet 0  . Nutritional Supplements (JUICE PLUS FIBRE PO) Take 1 capsule by mouth daily. Reported on 07/28/2015    . osimertinib mesylate (TAGRISSO) 80 MG tablet Take 1 tablet (80 mg total) by mouth daily. 30 tablet 2  . OVER THE COUNTER MEDICATION "Deep penetrating pain  relief oil"    . prochlorperazine (COMPAZINE) 10 MG tablet Take 1 tablet (10 mg total) by mouth every 6 (six) hours as needed for nausea or vomiting. (Patient not taking: Reported on 10/16/2017) 30 tablet 0  . ranitidine (ZANTAC) 300 MG tablet Take 1 tablet (300 mg total) by mouth at bedtime. 30 tablet 6  . TURMERIC PO Take 1 capsule by mouth 2 (two) times daily. Turmeric Superior 750 mg po bid     No current facility-administered medications for this visit.     SURGICAL HISTORY:  Past Surgical History:  Procedure Laterality Date  . IR GENERIC HISTORICAL  07/14/2015   IR RADIOLOGIST EVAL & MGMT 07/14/2015 Greggory Keen, MD [facility] INTERV RAD  . VIDEO BRONCHOSCOPY Bilateral 08/09/2014   Procedure: VIDEO BRONCHOSCOPY WITHOUT FLUORO;  Surgeon: [name], MD;  Location: [facility] ENDOSCOPY;  Service: Cardiopulmonary;  Laterality: Bilateral;  . VIDEO BRONCHOSCOPY Bilateral 07/06/2015   Procedure: VIDEO BRONCHOSCOPY WITHOUT FLUORO;  Surgeon: [name], MD;  Location: [facility] ENDOSCOPY;  Service: Cardiopulmonary;  Laterality: Bilateral;    REVIEW OF SYSTEMS:   Review of Systems  Constitutional: Negative for appetite change, chills, fatigue, fever and unexpected weight change.  HENT:   Negative for mouth sores, nosebleeds, sore throat and trouble swallowing.   Eyes: Negative for eye problems and icterus.  Respiratory: Negative for cough, hemoptysis, shortness of breath and wheezing.   Cardiovascular: Negative for chest pain and leg swelling.  Gastrointestinal: Negative for abdominal pain, constipation, diarrhea, nausea and vomiting.  Genitourinary: Negative for bladder incontinence, difficulty urinating, dysuria, frequency and hematuria.   Musculoskeletal: Negative for back pain, gait problem, neck pain and neck stiffness.  Skin: Negative for itching and rash.  Neurological: Negative for dizziness, extremity weakness, gait problem, headaches, light-headedness and seizures.  Hematological:  Negative for adenopathy. Does not bruise/bleed easily.  Psychiatric/Behavioral: Negative for confusion, depression and sleep disturbance. The patient is not nervous/anxious.     PHYSICAL EXAMINATION:  Blood pressure (!) 108/49, pulse (!) 103, temperature 97.7 F (36.5 C), temperature source Oral, resp. rate 20, height _0  (1.499 m), weight 107 lb 6.4 oz (48.7 kg), SpO2 100 %.  ECOG PERFORMANCE STATUS: 1 - Symptomatic but completely ambulatory  Physical Exam  Constitutional: Oriented to person, place, and time and well-developed, well-nourished, and in no distress. No distress.  HENT:  Head: Normocephalic and atraumatic.  Mouth/Throat: Oropharynx is clear and moist. No oropharyngeal exudate.  Eyes: Conjunctivae are normal. Right eye exhibits no discharge. Left eye exhibits no discharge. No scleral icterus.  Neck: Normal range of motion. Neck supple.  Cardiovascular: Normal rate, regular rhythm, normal heart sounds and intact distal pulses.   Pulmonary/Chest: Effort normal and breath sounds normal. No respiratory distress. No wheezes. No rales.  Abdominal: Soft. Bowel sounds are normal. Exhibits no distension and no mass. There is no tenderness.  Musculoskeletal: Normal range of motion. Exhibits no edema.  Lymphadenopathy:    No  cervical adenopathy.  Neurological: Alert and oriented to person, place, and time. Exhibits normal muscle tone. Gait normal. Coordination normal.  Skin: Skin is warm and dry. No rash noted. Not diaphoretic. No erythema. No pallor.  Psychiatric: Mood, memory and judgment normal.  Vitals reviewed.  LABORATORY DATA: Lab Results  Component Value Date   WBC 4.7 11/25/2017   HGB 13.3 11/25/2017   HCT 40.9 11/25/2017   MCV 98.3 11/25/2017   PLT 261 11/25/2017      Chemistry      Component Value Date/Time   NA 141 11/25/2017 0758   NA 140 01/17/2017 1003   K 4.4 11/25/2017 0758   K 3.9 01/17/2017 1003   CL 106 11/25/2017 0758   CO2 26 11/25/2017 0758    CO2 29 01/17/2017 1003   BUN 9 11/25/2017 0758   BUN 14.8 01/17/2017 1003   CREATININE 0.73 11/25/2017 0758   CREATININE 0.8 01/17/2017 1003      Component Value Date/Time   CALCIUM 9.2 11/25/2017 0758   CALCIUM 8.7 01/17/2017 1003   ALKPHOS 46 11/25/2017 0758   ALKPHOS 50 01/17/2017 1003   AST 21 11/25/2017 0758   AST 23 01/17/2017 1003   ALT 23 11/25/2017 0758   ALT 27 01/17/2017 1003   BILITOT 0.5 11/25/2017 0758   BILITOT 0.46 01/17/2017 1003       RADIOGRAPHIC STUDIES:  Ct Chest W Contrast  Result Date: 11/25/2017 CLINICAL DATA:  Non-small cell lung cancer. Adenocarcinoma with positive the GFR mutation. Right middle lobe lung mass and right hilar adenopathy. EXAM: CT CHEST, ABDOMEN, AND PELVIS WITH CONTRAST TECHNIQUE: Multidetector CT imaging of the chest, abdomen and pelvis was performed following the standard protocol during bolus administration of intravenous contrast. CONTRAST:  124m OMNIPAQUE IOHEXOL 300 MG/ML  SOLN COMPARISON:  09/02/2017 FINDINGS: CT CHEST FINDINGS Cardiovascular: Normal heart size. No pericardial effusion identified. Mediastinum/Nodes: Normal appearance of the thyroid gland. The trachea appears patent and is midline. Normal appearance of the esophagus. No enlarged supraclavicular or axillary lymph nodes. No mediastinal or hilar adenopathy identified. Lungs/Pleura: Within the paramediastinal right midlung and posterior and medial right lower lobe there is fibrosis, and masslike architectural distortion compatible with changes secondary to external beam radiation. The appearance is similar to 09/02/2017. Small right pleural effusion is unchanged. No suspicious pulmonary nodules identified. Musculoskeletal: Multifocal sclerotic bone metastases are again noted including the proximal right humerus, bilateral clavicles, bilateral ribs, and upper thoracic spine. Lesions within the sternum are also again noted and appears similar to previous exam. CT ABDOMEN PELVIS  FINDINGS Hepatobiliary: Similar appearance of mild intrahepatic biliary ductal dilatation. Small low-density foci within left lobe of liver are again noted and appear too small to reliably characterize. The gallbladder appears within normal limits. Fusiform dilatation of the common bile duct measures up to 1.2 cm. Pancreas: Unremarkable. No pancreatic ductal dilatation or surrounding inflammatory changes. Spleen: Normal in size without focal abnormality. Adrenals/Urinary Tract: Normal adrenal glands. No kidney mass or hydronephrosis.  Urinary bladder appears normal. Stomach/Bowel: Stomach is normal. The small bowel loops have a normal course and caliber without obstruction. The appendix is visualized and appears normal. Unremarkable appearance of the colon. Vascular/Lymphatic: Normal appearance of the abdominal aorta. The portal vein and hepatic veins appear patent. There is a new left retroperitoneal lymph node measuring 1 cm, image 60/2. There is no pelvic or inguinal adenopathy. Reproductive: Uterus appears normal. Dominant cyst in the right ovary is new from previous exam measuring 2.1 cm. Other: There is  no ascites or fluid collections. Musculoskeletal: Multifocal sclerotic bone metastases are again noted. Lesions are noted within the proximal left femur, left iliac bone, left ischium the, L1, L2 and sacrum. The appearance is unchanged from previous exam. IMPRESSION: 1. Stable CT of the chest. 2. Similar appearance of radiation change involving the right lung. No specific findings identified to suggest residual or recurrent local tumor. 3. Stable appearance of multifocal sclerotic bone metastases. 4. There is a new borderline enlarged left retroperitoneal lymph node measuring 1 cm. Electronically Signed   By: Kerby Moors M.D.   On: 11/25/2017 11:19   Ct Abdomen Pelvis W Contrast  Result Date: 11/25/2017 CLINICAL DATA:  Non-small cell lung cancer. Adenocarcinoma with positive the GFR mutation. Right  middle lobe lung mass and right hilar adenopathy. EXAM: CT CHEST, ABDOMEN, AND PELVIS WITH CONTRAST TECHNIQUE: Multidetector CT imaging of the chest, abdomen and pelvis was performed following the standard protocol during bolus administration of intravenous contrast. CONTRAST:  128m OMNIPAQUE IOHEXOL 300 MG/ML  SOLN COMPARISON:  09/02/2017 FINDINGS: CT CHEST FINDINGS Cardiovascular: Normal heart size. No pericardial effusion identified. Mediastinum/Nodes: Normal appearance of the thyroid gland. The trachea appears patent and is midline. Normal appearance of the esophagus. No enlarged supraclavicular or axillary lymph nodes. No mediastinal or hilar adenopathy identified. Lungs/Pleura: Within the paramediastinal right midlung and posterior and medial right lower lobe there is fibrosis, and masslike architectural distortion compatible with changes secondary to external beam radiation. The appearance is similar to 09/02/2017. Small right pleural effusion is unchanged. No suspicious pulmonary nodules identified. Musculoskeletal: Multifocal sclerotic bone metastases are again noted including the proximal right humerus, bilateral clavicles, bilateral ribs, and upper thoracic spine. Lesions within the sternum are also again noted and appears similar to previous exam. CT ABDOMEN PELVIS FINDINGS Hepatobiliary: Similar appearance of mild intrahepatic biliary ductal dilatation. Small low-density foci within left lobe of liver are again noted and appear too small to reliably characterize. The gallbladder appears within normal limits. Fusiform dilatation of the common bile duct measures up to 1.2 cm. Pancreas: Unremarkable. No pancreatic ductal dilatation or surrounding inflammatory changes. Spleen: Normal in size without focal abnormality. Adrenals/Urinary Tract: Normal adrenal glands. No kidney mass or hydronephrosis.  Urinary bladder appears normal. Stomach/Bowel: Stomach is normal. The small bowel loops have a normal course  and caliber without obstruction. The appendix is visualized and appears normal. Unremarkable appearance of the colon. Vascular/Lymphatic: Normal appearance of the abdominal aorta. The portal vein and hepatic veins appear patent. There is a new left retroperitoneal lymph node measuring 1 cm, image 60/2. There is no pelvic or inguinal adenopathy. Reproductive: Uterus appears normal. Dominant cyst in the right ovary is new from previous exam measuring 2.1 cm. Other: There is no ascites or fluid collections. Musculoskeletal: Multifocal sclerotic bone metastases are again noted. Lesions are noted within the proximal left femur, left iliac bone, left ischium the, L1, L2 and sacrum. The appearance is unchanged from previous exam. IMPRESSION: 1. Stable CT of the chest. 2. Similar appearance of radiation change involving the right lung. No specific findings identified to suggest residual or recurrent local tumor. 3. Stable appearance of multifocal sclerotic bone metastases. 4. There is a new borderline enlarged left retroperitoneal lymph node measuring 1 cm. Electronically Signed   By: TKerby MoorsM.D.   On: 11/25/2017 11:19     ASSESSMENT/PLAN:  Non-small cell carcinoma of lung, stage 4 (HCC) This is a very pleasant 47year old Asian female with stage IV non-small cell lung  cancer, adenocarcinoma with positive EGFR mutation with deletion in exon 19 diagnosed in July 2016 status post treatment with Tarceva for a total of 18 months but this was discontinued secondary to disease progression and development of T790M resistant mutation. The patient was started on treatment with Tagrisso 80 mg by mouth daily status post 18 months of treatment. She continues to tolerate this treatment well with no concerning adverse effects. She had a restaging CT scan and is here to discuss the results.  The patient was seen with Dr. Julien Nordmann.  CT scan results were discussed with the patient which showed stable disease without  evidence of disease progression.  Recommend that she continue on Tagrisso 80 mg daily.  She will receive her Xgeva injection today as scheduled.  The patient will return in 6 weeks for evaluation, repeat lab work, and another IT consultant injection.  The patient receives her Xgeva injection every 6 weeks.  The patient was advised to call immediately if she has any concerning symptoms in the interval. The patient voices understanding of current disease status and treatment options and is in agreement with the current care plan. All questions were answered. The patient knows to call the clinic with any problems, questions or concerns. We can certainly see the patient much sooner if necessary.   No orders of the defined types were placed in this encounter.    Mikey Bussing, DNP, AGPCNP-BC, AOCNP 11/27/17   ADDENDUM: Hematology/Oncology Attending: I had a face-to-face encounter with the patient.  I recommended her care plan.  This is a very pleasant 47 years old [place] female with metastatic non-small cell lung cancer, adenocarcinoma with positive EGFR mutation.  The patient is currently undergoing treatment with Tagrisso 80 mg p.o. daily status post 18 months of treatment and has been tolerating this treatment well with no concerning adverse effects except for mild skin rash but no significant diarrhea. She had repeat CT scan of the chest, abdomen and pelvis performed recently. I personally and independently reviewed the scans and discussed the results with the patient today. Her scan showed stable disease except for new 1.0 cm retroperitoneal lymph node that need close observation on upcoming imaging studies. I recommended for the patient to continue her current treatment with Tagrisso with the same dose.  The patient is currently asymptomatic and she would like to continue with the treatment as planned. I will see her back for follow-up visit in 6 weeks for evaluation and repeat blood work. The  patient was advised to call immediately if she has any concerning symptoms in the interval.  Disclaimer: This note was dictated with voice recognition software. Similar sounding words can inadvertently be transcribed and may be missed upon review. Eilleen Kempf, MD 11/27/17

## 2017-11-27 NOTE — Patient Instructions (Signed)

## 2017-11-27 NOTE — Telephone Encounter (Signed)
Scheduled appt per 11/13 los - gave patient AVS and calender per los.   

## 2017-11-27 NOTE — Assessment & Plan Note (Addendum)
This is a very pleasant 47 year old Asian female with stage IV non-small cell lung cancer, adenocarcinoma with positive EGFR mutation with deletion in exon 1 diagnosed in July 2016 status post treatment with Tarceva for a total of 18 months but this was discontinued secondary to disease progression and development of T790M resistant mutation. The patient was started on treatment with Tagrisso 80 mg by mouth daily status post 18 months of treatment. She continues to tolerate this treatment well with no concerning adverse effects. She had a restaging CT scan and is here to discuss the results.  The patient was seen with Dr. Julien Nordmann.  CT scan results were discussed with the patient which showed stable disease without evidence of disease progression.  Recommend that she continue on Tagrisso 80 mg daily.  She will receive her Xgeva injection today as scheduled.  The patient will return in 6 weeks for evaluation, repeat lab work, and another IT consultant injection.  The patient receives her Xgeva injection every 6 weeks.  The patient was advised to call immediately if she has any concerning symptoms in the interval. The patient voices understanding of current disease status and treatment options and is in agreement with the current care plan. All questions were answered. The patient knows to call the clinic with any problems, questions or concerns. We can certainly see the patient much sooner if necessary.

## 2017-12-03 NOTE — Telephone Encounter (Signed)
Oral Oncology Patient Advocate Encounter  I received a fax from [employer] stating that the patient has been auto re-enrolled into the program for 2020 to get her Tagrisso. I spoke to [employer] today and made them aware that the patient has LIS and does not need the assistance.  [employer] has discontinued this application  [place] Patient [place] Phone [phone] Fax [phone]

## 2017-12-06 ENCOUNTER — Telehealth: Payer: Self-pay | Admitting: Pharmacist

## 2017-12-06 NOTE — Telephone Encounter (Signed)
Oral Chemotherapy Pharmacist Encounter   Attempted to reach patient for follow up on oral medication: Tagrisso. No answer. Left VM for patient to call back with any questions or issues.   Thank you,  Leron Croak, PharmD PGY1 Pharmacy Resident [facility] [phone] 12/06/2017   1:46 PM

## 2017-12-09 NOTE — Telephone Encounter (Signed)
Oral Chemotherapy Pharmacist Encounter   Attempted to reach patient for follow up on oral medication: Tagrisso. No answer. Left VM for patient to call back with any questions or issues.   Thank you,  Leron Croak, PharmD PGY1 Pharmacy Resident [facility] [phone] 12/09/2017   1:40 PM

## 2017-12-13 NOTE — Telephone Encounter (Signed)
Oral Chemotherapy Pharmacist Encounter   Attempted to reach patient to provide update and offer for initial counseling on oral medication: Tagrisso. No answer. Left VM for patient to call back with any questions or issues.   Thank you,  Leron Croak, PharmD PGY1 Pharmacy Resident [facility] [phone] 12/13/2017   9:45 AM

## 2017-12-18 ENCOUNTER — Other Ambulatory Visit: Payer: Self-pay | Admitting: Family Medicine

## 2017-12-18 ENCOUNTER — Ambulatory Visit
Admission: RE | Admit: 2017-12-18 | Discharge: 2017-12-18 | Disposition: A | Discharge status: Home / Self Care | Payer: Medicare Other | Source: Ambulatory Visit | Attending: Family Medicine | Admitting: Family Medicine

## 2017-12-18 DIAGNOSIS — R059 Cough, unspecified: Secondary | ICD-10-CM

## 2017-12-18 DIAGNOSIS — R05 Cough: Secondary | ICD-10-CM

## 2017-12-18 DIAGNOSIS — R509 Fever, unspecified: Secondary | ICD-10-CM

## 2017-12-18 MED FILL — raNITIdine HCL 150 MG TABS: 150 | 90 days supply | Qty: 180 | Fill #0

## 2017-12-18 MED FILL — AZITHROMYCIN 250 MG TABLET: 250 | 5 days supply | Qty: 6 | Fill #0

## 2017-12-19 MED FILL — TAGRISSO 80 MG TABLET: 80 | 30 days supply | Qty: 30 | Fill #6

## 2018-01-06 ENCOUNTER — Other Ambulatory Visit: Payer: Self-pay | Admitting: Medical Oncology

## 2018-01-06 ENCOUNTER — Other Ambulatory Visit: Payer: Self-pay | Admitting: Radiation Therapy

## 2018-01-06 DIAGNOSIS — C3431 Malignant neoplasm of lower lobe, right bronchus or lung: Secondary | ICD-10-CM

## 2018-01-06 DIAGNOSIS — C7931 Secondary malignant neoplasm of brain: Secondary | ICD-10-CM

## 2018-01-06 DIAGNOSIS — C7949 Secondary malignant neoplasm of other parts of nervous system: Principal | ICD-10-CM

## 2018-01-07 ENCOUNTER — Inpatient Hospital Stay (HOSPITAL_BASED_OUTPATIENT_CLINIC_OR_DEPARTMENT_OTHER): Payer: Medicare Other | Admitting: Internal Medicine

## 2018-01-07 ENCOUNTER — Inpatient Hospital Stay: Payer: Medicare Other

## 2018-01-07 ENCOUNTER — Encounter: Payer: Self-pay | Admitting: Internal Medicine

## 2018-01-07 ENCOUNTER — Inpatient Hospital Stay: Payer: Medicare Other | Attending: Internal Medicine

## 2018-01-07 VITALS — BP 109/57 | HR 109 | Temp 97.6°F | Resp 18 | Ht 59.0 in | Wt 107.9 lb

## 2018-01-07 DIAGNOSIS — Z79899 Other long term (current) drug therapy: Secondary | ICD-10-CM | POA: Insufficient documentation

## 2018-01-07 DIAGNOSIS — C342 Malignant neoplasm of middle lobe, bronchus or lung: Secondary | ICD-10-CM

## 2018-01-07 DIAGNOSIS — C3431 Malignant neoplasm of lower lobe, right bronchus or lung: Secondary | ICD-10-CM

## 2018-01-07 DIAGNOSIS — Z923 Personal history of irradiation: Secondary | ICD-10-CM

## 2018-01-07 DIAGNOSIS — J700 Acute pulmonary manifestations due to radiation: Secondary | ICD-10-CM

## 2018-01-07 DIAGNOSIS — C7931 Secondary malignant neoplasm of brain: Secondary | ICD-10-CM | POA: Diagnosis not present

## 2018-01-07 DIAGNOSIS — C3491 Malignant neoplasm of unspecified part of right bronchus or lung: Secondary | ICD-10-CM

## 2018-01-07 DIAGNOSIS — R197 Diarrhea, unspecified: Secondary | ICD-10-CM | POA: Diagnosis not present

## 2018-01-07 DIAGNOSIS — C7951 Secondary malignant neoplasm of bone: Secondary | ICD-10-CM | POA: Diagnosis not present

## 2018-01-07 DIAGNOSIS — M9080 Osteopathy in diseases classified elsewhere, unspecified site: Secondary | ICD-10-CM

## 2018-01-07 DIAGNOSIS — M898X9 Other specified disorders of bone, unspecified site: Secondary | ICD-10-CM

## 2018-01-07 DIAGNOSIS — C7972 Secondary malignant neoplasm of left adrenal gland: Secondary | ICD-10-CM | POA: Diagnosis not present

## 2018-01-07 DIAGNOSIS — R05 Cough: Secondary | ICD-10-CM | POA: Insufficient documentation

## 2018-01-07 DIAGNOSIS — Z9221 Personal history of antineoplastic chemotherapy: Secondary | ICD-10-CM | POA: Diagnosis not present

## 2018-01-07 DIAGNOSIS — E889 Metabolic disorder, unspecified: Secondary | ICD-10-CM

## 2018-01-07 LAB — CMP (CANCER CENTER ONLY)
ALBUMIN: 3.7 g/dL (ref 3.5–5.0)
ALK PHOS: 49 U/L (ref 38–126)
ALT: 24 U/L (ref 0–44)
ANION GAP: 10 (ref 5–15)
AST: 19 U/L (ref 15–41)
BUN: 11 mg/dL (ref 6–20)
CALCIUM: 8.7 mg/dL — AB (ref 8.9–10.3)
CHLORIDE: 104 mmol/L (ref 98–111)
CO2: 23 mmol/L (ref 22–32)
CREATININE: 0.74 mg/dL (ref 0.44–1.00)
GFR, Estimated: >60 mL/min (ref >60)
Glucose, Bld: 91 mg/dL (ref 70–99)
Potassium: 3.9 mmol/L (ref 3.5–5.1)
SODIUM: 137 mmol/L (ref 135–145)
Total Bilirubin: 0.4 mg/dL (ref 0.3–1.2)
Total Protein: 6.9 g/dL (ref 6.5–8.1)

## 2018-01-07 LAB — CBC WITH DIFFERENTIAL (CANCER CENTER ONLY)
ABS IMMATURE GRANULOCYTES: 0.02 10*3/uL (ref 0.00–0.07)
BASOS ABS: 0 10*3/uL (ref 0.0–0.1)
BASOS PCT: 0 %
Eosinophils Absolute: 0.2 10*3/uL (ref 0.0–0.5)
Eosinophils Relative: 3 %
HCT: 42.7 % (ref 36.0–46.0)
HEMOGLOBIN: 13.8 g/dL (ref 12.0–15.0)
Immature Granulocytes: 0 %
LYMPHS PCT: 21 %
Lymphs Abs: 1.1 10*3/uL (ref 0.7–4.0)
MCH: 31.7 pg (ref 26.0–34.0)
MCHC: 32.3 g/dL (ref 30.0–36.0)
MCV: 98.2 fL (ref 80.0–100.0)
Monocytes Absolute: 0.6 10*3/uL (ref 0.1–1.0)
Monocytes Relative: 11 %
NEUTROS ABS: 3.3 10*3/uL (ref 1.7–7.7)
NEUTROS PCT: 65 %
NRBC: 0 % (ref 0.0–0.2)
PLATELETS: 268 10*3/uL (ref 150–400)
RBC: 4.35 MIL/uL (ref 3.87–5.11)
RDW: 13 % (ref 11.5–15.5)
WBC: 5.1 10*3/uL (ref 4.0–10.5)

## 2018-01-07 MED ORDER — DENOSUMAB 120 MG/1.7ML ~~LOC~~ SOLN
120.0000 mg | Freq: Once | SUBCUTANEOUS | Status: AC
  Administered 2018-01-07: 120 mg via SUBCUTANEOUS

## 2018-01-07 MED ORDER — DENOSUMAB 120 MG/1.7ML ~~LOC~~ SOLN
SUBCUTANEOUS | Status: AC
  Filled 2018-01-07: qty 1.7

## 2018-01-07 NOTE — Progress Notes (Signed)
[facility] [phone]   [phone]  OFFICE PROGRESS NOTE  [name], MD [place] [address]  DIAGNOSIS: Stage IV (T2a, N1, M1b) non-small cell lung cancer, adenocarcinoma with positive EGFR mutation (deletion 26) presented with right middle lobe lung mass, right hilar adenopathy as well as metastatic disease to the bone, brain and left adrenal diagnosed in July 2016.  PRIOR THERAPY: 1) palliative radiotherapy to the brain as well as metastatic bone lesions in the pelvis. 2) Tarceva 150 mg by mouth daily started on 09/25/2014. Status post 5 months of treatment. 3) status post palliative radiotherapy to the right middle lobe lung mass for treatment of hemoptysis. 4) Tarceva 100 mg by mouth daily started 02/19/2015 status post 13 months of treatment. This was discontinued in May 2018 after the patient had disease progression and development of T790M resistant mutation.  5) palliative stereotactic radiotherapy to the left adrenal gland metastatic lesion under the care of Dr. Lisbeth Renshaw completed on April 10, 2017.  CURRENT THERAPY: 1) Tagrisso 80 mg by mouth daily started 06/02/2016.  Status post 19 months of treatment. 2) Xgeva 120 g subcutaneously every 6 weeks.  INTERVAL HISTORY: Deborah Foster 47 y.o. female returns to the clinic today for follow-up visit.  The patient is feeling fine today with no concerning complaints except for dry cough and she is currently on Tessalon.  She was also seen by her primary care physician and chest x-ray showed no concerning findings for pneumonia.  She was treated empirically with Z-Pak.  She also had few episodes of diarrhea a week ago.  She denied having any current nausea, vomiting, constipation.  She denied having any weight loss or night sweats.  She is here today for evaluation and repeat blood work.  MEDICAL HISTORY: Past Medical History:  Diagnosis Date  . Bone metastasis ([place])    . Hemoptysis   . Hypokalemia   . lung ca dx'd 07/2014  . Lung mass   . Metastasis to adrenal gland ([place])   . Metastasis to brain ([place])   . Pneumonia   . Radiation 08/23/14-09/07/14   Brain/chest and left hip 30 Gy 12 Fx  . URI (upper respiratory infection) 02-02-2015    ALLERGIES:  is allergic to other and doxycycline.  MEDICATIONS:  Current Outpatient Medications  Medication Sig Dispense Refill  . azithromycin (ZITHROMAX) 250 MG tablet Take by mouth daily.    . benzonatate (TESSALON) 200 MG capsule TK ONE C PO Q 8 H PRN COU  0  . Calcium Carbonate-Vitamin D (CALCIUM 500 + D) 500-125 MG-UNIT TABS Take 500 mg by mouth 2 (two) times daily.    . clindamycin (CLINDAGEL) 1 % gel Apply 1 application topically 2 (two) times daily.    . clobetasol cream (TEMOVATE) 3.22 % Apply 1 application topically 2 (two) times daily.    . Cyanocobalamin (VITAMIN B-12) 2500 MCG SUBL Place 2,500 mcg under the tongue daily.    Marland Kitchen Dextromethorphan Polistirex (DELSYM PO) Take by mouth.    . ENSURE (ENSURE) Take 1 Can by mouth 2 (two) times daily between meals.     . ferrous sulfate 325 (65 FE) MG EC tablet Take 325 mg by mouth daily with breakfast.    . fluticasone (VERAMYST) 27.5 MCG/SPRAY nasal spray Place 2 sprays into the nose daily.    . folic acid (FOLVITE) 025 MCG tablet Take 1 tablet (800 mcg total) by mouth daily. 30 tablet 1  .  loratadine (CLARITIN) 10 MG tablet Take 1 tablet (10 mg total) by mouth daily. 30 tablet 0  . Nutritional Supplements (JUICE PLUS FIBRE PO) Take 1 capsule by mouth daily. Reported on 07/28/2015    . omeprazole (PRILOSEC) 20 MG capsule Take 20 mg by mouth daily.    Marland Kitchen osimertinib mesylate (TAGRISSO) 80 MG tablet Take 1 tablet (80 mg total) by mouth daily. 30 tablet 2  . OVER THE COUNTER MEDICATION "Deep penetrating pain relief oil"    . TURMERIC PO Take 1 capsule by mouth 2 (two) times daily. Turmeric Superior 750 mg po bid    . AMBULATORY NON FORMULARY MEDICATION Medication  Name: MAGIC MOUTHWASH 2 % Viscous Lidocaine  Maalox Benadryl Susp.  Disp: 1:1:1 213m bottle Sig: 173mPO Swish & Spit  q 3-4hrs. PRN mouth sores (Patient not taking: Reported on 07/29/2017) 200 mL 3  . chlorpheniramine-HYDROcodone (TUSSIONEX) 10-8 MG/5ML SUER Take 5 mLs by mouth every 12 (twelve) hours as needed. (Patient not taking: Reported on 01/07/2018) 140 mL 0  . prochlorperazine (COMPAZINE) 10 MG tablet Take 1 tablet (10 mg total) by mouth every 6 (six) hours as needed for nausea or vomiting. (Patient not taking: Reported on 10/16/2017) 30 tablet 0  . ranitidine (ZANTAC) 300 MG tablet Take 1 tablet (300 mg total) by mouth at bedtime. (Patient not taking: Reported on 01/07/2018) 30 tablet 6   No current facility-administered medications for this visit.     SURGICAL HISTORY:  Past Surgical History:  Procedure Laterality Date  . IR GENERIC HISTORICAL  07/14/2015   IR RADIOLOGIST EVAL & MGMT 07/14/2015 MiGreggory KeenMD [facility] INTERV RAD  . VIDEO BRONCHOSCOPY Bilateral 08/09/2014   Procedure: VIDEO BRONCHOSCOPY WITHOUT FLUORO;  Surgeon: DoJuanito DoomMD;  [facility];  Service: Cardiopulmonary;  Laterality: Bilateral;  . VIDEO BRONCHOSCOPY Bilateral 07/06/2015   Procedure: VIDEO BRONCHOSCOPY WITHOUT FLUORO;  Surgeon: DoJuanito DoomMD;  Location: [facility] ENDOSCOPY;  Service: Cardiopulmonary;  Laterality: Bilateral;    REVIEW OF SYSTEMS:  A comprehensive review of systems was negative except for: Respiratory: positive for cough   PHYSICAL EXAMINATION: General appearance: alert, cooperative and no distress Head: Normocephalic, without obvious abnormality, atraumatic Neck: no adenopathy, no JVD, supple, symmetrical, trachea midline and thyroid not enlarged, symmetric, no tenderness/mass/nodules Lymph nodes: Cervical, supraclavicular, and axillary nodes normal. Resp: clear to auscultation bilaterally Back: symmetric, no curvature. ROM normal. No CVA tenderness. Cardio:  regular rate and rhythm, S1, S2 normal, no murmur, click, rub or gallop GI: soft, non-tender; bowel sounds normal; no masses,  no organomegaly Extremities: extremities normal, atraumatic, no cyanosis or edema  ECOG PERFORMANCE STATUS: 1 - Symptomatic but completely ambulatory  Blood pressure (!) 109/57, pulse (!) 109, temperature 97.6 F (36.4 C), temperature source Oral, resp. rate 18, height '4\' 11"'$  (1.499 m), weight 107 lb 14.4 oz (48.9 kg), SpO2 100 %.  LABORATORY DATA: Lab Results  Component Value Date   WBC 5.1 01/07/2018   HGB 13.8 01/07/2018   HCT 42.7 01/07/2018   MCV 98.2 01/07/2018   PLT 268 01/07/2018      Chemistry      Component Value Date/Time   NA 137 01/07/2018 0738   NA 140 01/17/2017 1003   K 3.9 01/07/2018 0738   K 3.9 01/17/2017 1003   CL 104 01/07/2018 0738   CO2 23 01/07/2018 0738   CO2 29 01/17/2017 1003   BUN 11 01/07/2018 0738   BUN 14.8 01/17/2017 1003   CREATININE 0.74 01/07/2018 072536  CREATININE 0.8 01/17/2017 1003      Component Value Date/Time   CALCIUM 8.7 (L) 01/07/2018 0738   CALCIUM 8.7 01/17/2017 1003   ALKPHOS 49 01/07/2018 0738   ALKPHOS 50 01/17/2017 1003   AST 19 01/07/2018 0738   AST 23 01/17/2017 1003   ALT 24 01/07/2018 0738   ALT 27 01/17/2017 1003   BILITOT 0.4 01/07/2018 0738   BILITOT 0.46 01/17/2017 1003       RADIOGRAPHIC STUDIES: Dg Chest 2 View  Result Date: 12/18/2017 CLINICAL DATA:  Cough with fever and chills EXAM: CHEST - 2 VIEW COMPARISON:  11/25/2017 FINDINGS: Volume loss and angular opacity at the right base attributed to radiotherapy and scarring by recent chest CT. No superimposed consolidation, edema, effusion, or pneumothorax. Normal heart size and aortic contours. IMPRESSION: Stable post treatment changes at the right base. No evidence of superimposed pneumonia. Electronically Signed   By: Monte Fantasia M.D.   On: 12/18/2017 11:12    ASSESSMENT AND PLAN:  This is a very pleasant 47 years old Asian  female with stage IV non-small cell lung cancer, adenocarcinoma with positive EGFR mutation with deletion in exon 33 diagnosed in July 2016 status post treatment with Tarceva for a total of 18 months but this was discontinued secondary to disease progression and development of T790M resistant mutation. The patient was started on treatment with Tagrisso 80 mg by mouth daily status post 19 months of treatment. The patient has been tolerating this treatment well with no concerning complaints. I recommended for her to continue her current treatment with Tagrisso. For the metastatic bone disease, she will continue her current treatment with Xgeva every 6 weeks.  She will come back for follow-up visit in 6 weeks for evaluation and repeat blood work. She was advised to call immediately if she has any concerning symptoms in the interval. The patient voices understanding of current disease status and treatment options and is in agreement with the current care plan. All questions were answered. The patient knows to call the clinic with any problems, questions or concerns. We can certainly see the patient much sooner if necessary.  Disclaimer: This note was dictated with voice recognition software. Similar sounding words can inadvertently be transcribed and may not be corrected upon review.

## 2018-01-07 NOTE — Patient Instructions (Signed)
Denosumab injection  What is this medicine?  DENOSUMAB (den oh sue mab) slows bone breakdown. Prolia is used to treat osteoporosis in women after menopause and in men. Xgeva is used to prevent bone fractures and other bone problems caused by cancer bone metastases. Xgeva is also used to treat giant cell tumor of the bone.  This medicine may be used for other purposes; ask your health care provider or pharmacist if you have questions.  What should I tell my health care provider before I take this medicine?  They need to know if you have any of these conditions:  -dental disease  -eczema  -infection or history of infections  -kidney disease or on dialysis  -low blood calcium or vitamin D  -malabsorption syndrome  -scheduled to have surgery or tooth extraction  -taking medicine that contains denosumab  -thyroid or parathyroid disease  -an unusual reaction to denosumab, other medicines, foods, dyes, or preservatives  -pregnant or trying to get pregnant  -breast-feeding  How should I use this medicine?  This medicine is for injection under the skin. It is given by a health care professional in a hospital or clinic setting.  If you are getting Prolia, a special MedGuide will be given to you by the pharmacist with each prescription and refill. Be sure to read this information carefully each time.  For Prolia, talk to your pediatrician regarding the use of this medicine in children. Special care may be needed. For Xgeva, talk to your pediatrician regarding the use of this medicine in children. While this drug may be prescribed for children as young as 13 years for selected conditions, precautions do apply.  Overdosage: If you think you have taken too much of this medicine contact a poison control center or emergency room at once.  NOTE: This medicine is only for you. Do not share this medicine with others.  What if I miss a dose?  It is important not to miss your dose. Call your doctor or health care professional if you are  unable to keep an appointment.  What may interact with this medicine?  Do not take this medicine with any of the following medications:  -other medicines containing denosumab  This medicine may also interact with the following medications:  -medicines that suppress the immune system  -medicines that treat cancer  -steroid medicines like prednisone or cortisone  This list may not describe all possible interactions. Give your health care provider a list of all the medicines, herbs, non-prescription drugs, or dietary supplements you use. Also tell them if you smoke, drink alcohol, or use illegal drugs. Some items may interact with your medicine.  What should I watch for while using this medicine?  Visit your doctor or health care professional for regular checks on your progress. Your doctor or health care professional may order blood tests and other tests to see how you are doing.  Call your doctor or health care professional if you get a cold or other infection while receiving this medicine. Do not treat yourself. This medicine may decrease your body's ability to fight infection.  You should make sure you get enough calcium and vitamin D while you are taking this medicine, unless your doctor tells you not to. Discuss the foods you eat and the vitamins you take with your health care professional.  See your dentist regularly. Brush and floss your teeth as directed. Before you have any dental work done, tell your dentist you are receiving this medicine.  Do   not become pregnant while taking this medicine or for 5 months after stopping it. Women should inform their doctor if they wish to become pregnant or think they might be pregnant. There is a potential for serious side effects to an unborn child. Talk to your health care professional or pharmacist for more information.  What side effects may I notice from receiving this medicine?  Side effects that you should report to your doctor or health care professional as soon as  possible:  -allergic reactions like skin rash, itching or hives, swelling of the face, lips, or tongue  -breathing problems  -chest pain  -fast, irregular heartbeat  -feeling faint or lightheaded, falls  -fever, chills, or any other sign of infection  -muscle spasms, tightening, or twitches  -numbness or tingling  -skin blisters or bumps, or is dry, peels, or red  -slow healing or unexplained pain in the mouth or jaw  -unusual bleeding or bruising  Side effects that usually do not require medical attention (Report these to your doctor or health care professional if they continue or are bothersome.):  -muscle pain  -stomach upset, gas  This list may not describe all possible side effects. Call your doctor for medical advice about side effects. You may report side effects to FDA at 1-800-FDA-1088.  Where should I keep my medicine?  This medicine is only given in a clinic, doctor's office, or other health care setting and will not be stored at home.  NOTE: This sheet is a summary. It may not cover all possible information. If you have questions about this medicine, talk to your doctor, pharmacist, or health care provider.      2016, Elsevier/Gold Standard. (2011-07-02 12:37:47)

## 2018-01-07 NOTE — Progress Notes (Signed)
Pts calcium is low at 8.7 today. Pt has medication on med list but has not been taking it. I educated the PT on the importance of taking the medication and she verbalized understanding.

## 2018-01-16 MED FILL — TAGRISSO 80 MG TABLET: 80 | 30 days supply | Qty: 30 | Fill #7

## 2018-01-23 ENCOUNTER — Other Ambulatory Visit: Payer: Self-pay | Admitting: Radiation Therapy

## 2018-01-24 ENCOUNTER — Ambulatory Visit
Admission: RE | Admit: 2018-01-24 | Discharge: 2018-01-24 | Disposition: A | Discharge status: Home / Self Care | Payer: Medicare Other | Source: Ambulatory Visit | Attending: Radiation Oncology | Admitting: Radiation Oncology

## 2018-01-24 DIAGNOSIS — C7949 Secondary malignant neoplasm of other parts of nervous system: Principal | ICD-10-CM

## 2018-01-24 DIAGNOSIS — C7931 Secondary malignant neoplasm of brain: Secondary | ICD-10-CM

## 2018-01-24 MED ORDER — GADOBENATE DIMEGLUMINE 529 MG/ML IV SOLN
9.0000 mL | Freq: Once | INTRAVENOUS | Status: AC | PRN
  Administered 2018-01-24: 9 mL via INTRAVENOUS

## 2018-01-27 ENCOUNTER — Other Ambulatory Visit: Payer: Self-pay

## 2018-01-27 ENCOUNTER — Ambulatory Visit
Admission: RE | Admit: 2018-01-27 | Discharge: 2018-01-27 | Disposition: A | Discharge status: Home / Self Care | Payer: Medicare Other | Source: Ambulatory Visit | Attending: Radiation Oncology | Admitting: Radiation Oncology

## 2018-01-27 ENCOUNTER — Encounter: Payer: Self-pay | Admitting: Radiation Oncology

## 2018-01-27 VITALS — BP 101/65 | HR 101 | Temp 97.8°F | Resp 18 | Wt 106.2 lb

## 2018-01-27 DIAGNOSIS — C342 Malignant neoplasm of middle lobe, bronchus or lung: Secondary | ICD-10-CM | POA: Diagnosis present

## 2018-01-27 DIAGNOSIS — Z79899 Other long term (current) drug therapy: Secondary | ICD-10-CM | POA: Diagnosis not present

## 2018-01-27 DIAGNOSIS — K219 Gastro-esophageal reflux disease without esophagitis: Secondary | ICD-10-CM | POA: Diagnosis not present

## 2018-01-27 DIAGNOSIS — C3491 Malignant neoplasm of unspecified part of right bronchus or lung: Secondary | ICD-10-CM

## 2018-01-27 DIAGNOSIS — C7931 Secondary malignant neoplasm of brain: Secondary | ICD-10-CM

## 2018-01-27 NOTE — Progress Notes (Signed)
Radiation Oncology         [phone] ________________________________  Name: Deborah Foster MRN: [id number]  Date of Service: 01/27/2018 DOB: [birth year]  Post Treatment Note  CC: [name], MD  [name], MD  Diagnosis:  Stage IV T2a, N1, M1b NSCLC, adenocarcinoma of the right middle lobe    Interval Since Last Radiation:  5 weeks   04/01/2017 - 04/10/2017: Left Adrenal Gland / 40 Gy in 5 fractions  07/21/2015 to 08/03/2015:  The Right hilar tumor was treated to 30 Gy in 10 fractions at 3 Gy per fraction.   08/23/14-09/07/14:  30 Gy in 12 fractions was prescribed to the right lung, whole brain, and left hip.   Narrative:    Deborah Foster is a pleasant 48 y.o. woman with stage IV, adenocarcinoma with EGFR mutation of the right  Middle lobe,  which was identified in the summer of 2016. She received whole brain and right lung radiotherapy along the left hip treatment in August 2016. She's been followed in surveillance since, she recurred locally in the chest, and received radiotherapy in the summer of 2017.  She was found to have progressive disease in her left adrenal gland on CT on 03/08/17,  measuring 19 x 18 mm, previously 12 x 10.5 mm. She completed SBRT and continues [place]. She had repeat imaging in May 2019 that revealed improvement in the treated left adrenal mass and stable disease without progression or new changes. She had repeat imaging of the brain on 07/25/17 that revealed stability in 7 of the visible treated lesions. She comes today to review this.   On review of systems, the patient reports that she is doing well overall. She reports some haziness of her vision at night time. She reports she has not been seen by an eye doctor in a very long time and does not have an established eye doctor to call. She reports her previously noted LUQ discomfort and queasiness has resolved with omeprazole. She has also had a sore throat for a few days. She denies  any chest pain, shortness of breath,  fevers, chills, night sweats, unintended weight changes. She denies any bowel or bladder disturbances, and denies abdominal pain, or vomiting. She denies any new musculoskeletal or joint aches or pains, new skin lesions or concerns. A complete review of systems is obtained and is otherwise negative.   Past Medical History:  Past Medical History:  Diagnosis Date  . Bone metastasis ([place])   . Hemoptysis   . Hypokalemia   . lung ca dx'd 07/2014  . Lung mass   . Metastasis to adrenal gland ([place])   . Metastasis to brain ([place])   . Pneumonia   . Radiation 08/23/14-09/07/14   Brain/chest and left hip 30 Gy 12 Fx  . URI (upper respiratory infection) 02-02-2015    Past Surgical History: Past Surgical History:  Procedure Laterality Date  . IR GENERIC HISTORICAL  07/14/2015   IR RADIOLOGIST EVAL & MGMT 07/14/2015 Greggory Keen, MD [facility] INTERV RAD  . VIDEO BRONCHOSCOPY Bilateral 08/09/2014   Procedure: VIDEO BRONCHOSCOPY WITHOUT FLUORO;  Surgeon: [name], MD;  Location: [facility]COPY;  Service: Cardiopulmonary;  Laterality: Bilateral;  . VIDEO BRONCHOSCOPY Bilateral 07/06/2015   Procedure: VIDEO BRONCHOSCOPY WITHOUT FLUORO;  Surgeon: [name], MD;  Location: [facility]COPY;  Service: Cardiopulmonary;  Laterality: Bilateral;    Social History:  Social History   Socioeconomic History  . Marital status: Married  Spouse name: Not on file  . Number of children: 3  . Years of education: Not on file  . Highest education level: Not on file  Occupational History  . Occupation: unemployed  Social Needs  . Financial resource strain: Not on file  . Food insecurity:    Worry: Not on file    Inability: Not on file  . Transportation needs:    Medical: Not on file    Non-medical: Not on file  Tobacco Use  . Smoking status: Never Smoker  . Smokeless tobacco: Never Used  Substance and Sexual Activity  . Alcohol use: No    Alcohol/week: 0.0  standard drinks  . Drug use: No  . Sexual activity: Not Currently  Lifestyle  . Physical activity:    Days per week: Not on file    Minutes per session: Not on file  . Stress: Not on file  Relationships  . Social connections:    Talks on phone: Not on file    Gets together: Not on file    Attends religious service: Not on file    Active member of club or organization: Not on file    Attends meetings of clubs or organizations: Not on file    Relationship status: Not on file  . Intimate partner violence:    Fear of current or ex partner: Not on file    Emotionally abused: Not on file    Physically abused: Not on file    Forced sexual activity: Not on file  Other Topics Concern  . Not on file  Social History Narrative   Patient moved to [place] in [place].   Had been in [place] since that time.  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 She's originally from [place]. She has three sons, 20, 2, and 9.  Family History: Family History  Problem Relation Age of Onset  . Hyperlipidemia Mother      ALLERGIES:  is allergic to other and doxycycline.  Meds: Current Outpatient Medications  Medication Sig Dispense Refill  . benzonatate (TESSALON) 200 MG capsule TK ONE C PO Q 8 H PRN COU  0  . Calcium Carbonate-Vitamin D (CALCIUM 500 + D) 500-125 MG-UNIT TABS Take 500 mg by mouth 2 (two) times daily.    . chlorpheniramine-HYDROcodone (TUSSIONEX) 10-8 MG/5ML SUER Take 5 mLs by mouth every 12 (twelve) hours as needed. 140 mL 0  . clindamycin (CLINDAGEL) 1 % gel Apply 1 application topically 2 (two) times daily.    . clobetasol cream (TEMOVATE) 8.54 % Apply 1 application topically 2 (two) times daily.    . Cyanocobalamin (VITAMIN B-12) 2500 MCG SUBL Place 2,500 mcg under the tongue daily.    Marland Kitchen Dextromethorphan Polistirex (DELSYM PO) Take by mouth.    . ENSURE (ENSURE) Take 1 Can by mouth 2 (two) times daily between meals.     . ferrous sulfate 325 (65 FE) MG EC tablet Take  325 mg by mouth daily with breakfast.    . folic acid (FOLVITE) 627 MCG tablet Take 1 tablet (800 mcg total) by mouth daily. 30 tablet 1  . loratadine (CLARITIN) 10 MG tablet Take 1 tablet (10 mg total) by mouth daily. 30 tablet 0  . Nutritional Supplements (JUICE PLUS FIBRE PO) Take 1 capsule by mouth daily. Reported on 07/28/2015    . omeprazole (PRILOSEC) 20 MG capsule Take 20 mg by mouth daily.    Marland Kitchen osimertinib mesylate (TAGRISSO) 80 MG tablet Take 1 tablet (80  mg total) by mouth daily. 30 tablet 2  . OVER THE COUNTER MEDICATION "Deep penetrating pain relief oil"    . prochlorperazine (COMPAZINE) 10 MG tablet Take 1 tablet (10 mg total) by mouth every 6 (six) hours as needed for nausea or vomiting. 30 tablet 0  . ranitidine (ZANTAC) 300 MG tablet Take 1 tablet (300 mg total) by mouth at bedtime. 30 tablet 6  . TURMERIC PO Take 1 capsule by mouth 2 (two) times daily. Turmeric Superior 750 mg po bid    . AMBULATORY NON FORMULARY MEDICATION Medication Name: MAGIC MOUTHWASH 2 % Viscous Lidocaine  Maalox Benadryl Susp.  Disp: 1:1:1 216m bottle Sig: 146mPO Swish & Spit  q 3-4hrs. PRN mouth sores (Patient not taking: Reported on 07/29/2017) 200 mL 3  . azithromycin (ZITHROMAX) 250 MG tablet Take by mouth daily.    . fluticasone (VERAMYST) 27.5 MCG/SPRAY nasal spray Place 2 sprays into the nose daily.     No current facility-administered medications for this encounter.     Physical Findings:  weight is 106 lb 4 oz (48.2 kg). Her oral temperature is 97.8 F (36.6 C). Her blood pressure is 101/65 and her pulse is 101 (abnormal). Her respiration is 18 and oxygen saturation is 100%.  Pain Assessment Pain Score: 0-No pain/10 In general this is a well appearing Asian female in no acute distress. She's alert and oriented x4 and appropriate throughout the examination. Cardiopulmonary assessment is negative for acute distress and she exhibits normal effort.  She does not have any  focal neurologic  deficits during our visit, and does not have any gait disturbances. HEENT reveals cobblestoning effect in the posterior pharynx with mild injection. No purulent drainage is seen.    Lab Findings: Lab Results  Component Value Date   WBC 5.1 01/07/2018   HGB 13.8 01/07/2018   HCT 42.7 01/07/2018   MCV 98.2 01/07/2018   PLT 268 01/07/2018     Radiographic Findings: Mr BrJeri CosoTZontrast  Result Date: 01/24/2018 CLINICAL DATA:  Restrained cerebral metastatic disease with whole-brain radiotherapy in 2016 EXAM: MRI HEAD WITHOUT AND WITH CONTRAST TECHNIQUE: Multiplanar, multiecho pulse sequences of the brain and surrounding structures were obtained without and with intravenous contrast. CONTRAST:  52m36mULTIHANCE GADOBENATE DIMEGLUMINE 529 MG/ML IV SOLN COMPARISON:  07/25/2017 FINDINGS: BRAIN New Lesions: None. Larger lesions: None. Stable or Smaller lesions: There are treated brain metastases showing chronic blood products including T1 shortening. 1. Subcentimeter lesion located central left cerebellum and seen on 10:36. 2. 18 mm lesion located posterior left temporal white matter and seen on 10:58. There is increased but still mild vasogenic edema about this lesion. 3. Subcentimeter lesion located low right parietal lobe and seen on 10:62. 4. Subcentimeter lesion located right occipital cortex and seen on 10:67. 5. 9 mm lesion located left anterior frontal lobe and seen on 10:87. 6. Subcentimeter lesion located right frontal paracentral and seen on 10:93. 7. Subcentimeter lesion located right parietal and seen on 10:104. Other Brain findings: No incidental infarct, hemorrhage, hydrocephalus, or mass effect. Vascular: Major flow voids and vascular enhancements are preserved Skull and upper cervical spine: Negative for marrow lesion Sinuses/Orbits: Negative IMPRESSION: 1. Stable treated brain metastases. No evidence of new or progressive metastatic disease. 2. Mild and mildly increased vasogenic edema  about the largest treated lesion in the left temporal lobe. Electronically Signed   By: JonMonte FantasiaD.   On: 01/24/2018 11:29    Impression/Plan: 1. Stage IV T2a, N1, M1b  NSCLC, adenocarcinoma, positive EGFR mutation with deletion in exon 19, of the right middle lobe. The patient is doing quite well radiographically without evidence of progressive or new disease in the brain. She continues to follow with Dr. Julien Nordmann remains on [place]. She is due to have continued follow up every 6 weeks with medical oncology, and will remain on 6 month interval surveillance for her brain provided that her systemic disease is also controlled. 2. GERD. The patient has done well since being on PPI therapy and reports her queasiness and LUQ discomfort has resolved. She was encouraged to continue omeprazole. 3. Changes in visual acuity at night. I encouraged the patient that this needs to be worked up and could be a long term effect of prior radiotherapy to include cataracts. She does not have insurance coverage for ophthalmic care that she is aware of and I'll reach out to our social workers to see if there are any eye clinics available for indigent care.  4. Sore throat. I believe she has a viral illness going on and I encouraged her to increase fluids po and to try OTC robitussin for cough suppression and her sore throat. If she becomes febrile or her symptoms do not improve, she needs to be seen and re-evaluated. She is in agreement with this plan.    Carola Rhine, PAC

## 2018-01-28 NOTE — Progress Notes (Signed)
Brain and Spine Tumor Board Documentation  Deborah Foster was presented by Cecil Cobbs, MD at Brain and Spine Tumor Board on 01/28/2018, which included representatives from neuro oncology, radiation oncology, surgical oncology, navigation, pathology, radiology.  Deborah Foster was presented as a current patient with history of the following treatments:  .  Additionally, we reviewed previous medical and familial history, history of present illness, and recent lab results along with all available histopathologic and imaging studies. The tumor board considered available treatment options and made the following recommendations:  Active surveillance    Tumor board is a meeting of clinicians from various specialty areas who evaluate and discuss patients for whom a multidisciplinary approach is being considered. Final determinations in the plan of care are those of the provider(s). The responsibility for follow up of recommendations given during tumor board is that of the provider.   Today's extended care, comprehensive team conference, Deborah Foster was not present for the discussion and was not examined.

## 2018-01-29 ENCOUNTER — Other Ambulatory Visit: Payer: Self-pay | Admitting: *Deleted

## 2018-01-29 DIAGNOSIS — F411 Generalized anxiety disorder: Secondary | ICD-10-CM

## 2018-02-14 MED FILL — TAGRISSO 80 MG TABLET: 80 | 30 days supply | Qty: 30 | Fill #8

## 2018-02-19 ENCOUNTER — Inpatient Hospital Stay: Payer: Medicare Other | Attending: Internal Medicine

## 2018-02-19 ENCOUNTER — Inpatient Hospital Stay: Payer: Medicare Other

## 2018-02-19 ENCOUNTER — Inpatient Hospital Stay (HOSPITAL_BASED_OUTPATIENT_CLINIC_OR_DEPARTMENT_OTHER): Payer: Medicare Other | Admitting: Internal Medicine

## 2018-02-19 ENCOUNTER — Other Ambulatory Visit: Payer: Self-pay | Admitting: Medical Oncology

## 2018-02-19 ENCOUNTER — Telehealth: Payer: Self-pay | Admitting: Internal Medicine

## 2018-02-19 ENCOUNTER — Encounter: Payer: Self-pay | Admitting: Internal Medicine

## 2018-02-19 VITALS — BP 98/52 | HR 102 | Temp 98.3°F | Resp 18 | Ht 59.0 in | Wt 105.3 lb

## 2018-02-19 DIAGNOSIS — C7951 Secondary malignant neoplasm of bone: Secondary | ICD-10-CM | POA: Insufficient documentation

## 2018-02-19 DIAGNOSIS — C3491 Malignant neoplasm of unspecified part of right bronchus or lung: Secondary | ICD-10-CM

## 2018-02-19 DIAGNOSIS — Z79899 Other long term (current) drug therapy: Secondary | ICD-10-CM | POA: Insufficient documentation

## 2018-02-19 DIAGNOSIS — C7972 Secondary malignant neoplasm of left adrenal gland: Secondary | ICD-10-CM | POA: Insufficient documentation

## 2018-02-19 DIAGNOSIS — R1032 Left lower quadrant pain: Secondary | ICD-10-CM | POA: Diagnosis not present

## 2018-02-19 DIAGNOSIS — M9080 Osteopathy in diseases classified elsewhere, unspecified site: Secondary | ICD-10-CM

## 2018-02-19 DIAGNOSIS — M25511 Pain in right shoulder: Secondary | ICD-10-CM

## 2018-02-19 DIAGNOSIS — M898X9 Other specified disorders of bone, unspecified site: Secondary | ICD-10-CM

## 2018-02-19 DIAGNOSIS — R197 Diarrhea, unspecified: Secondary | ICD-10-CM

## 2018-02-19 DIAGNOSIS — E889 Metabolic disorder, unspecified: Secondary | ICD-10-CM

## 2018-02-19 DIAGNOSIS — Z923 Personal history of irradiation: Secondary | ICD-10-CM | POA: Diagnosis not present

## 2018-02-19 DIAGNOSIS — G893 Neoplasm related pain (acute) (chronic): Secondary | ICD-10-CM

## 2018-02-19 DIAGNOSIS — C7931 Secondary malignant neoplasm of brain: Secondary | ICD-10-CM

## 2018-02-19 DIAGNOSIS — C342 Malignant neoplasm of middle lobe, bronchus or lung: Secondary | ICD-10-CM | POA: Diagnosis present

## 2018-02-19 DIAGNOSIS — C3431 Malignant neoplasm of lower lobe, right bronchus or lung: Secondary | ICD-10-CM

## 2018-02-19 DIAGNOSIS — Z5111 Encounter for antineoplastic chemotherapy: Secondary | ICD-10-CM

## 2018-02-19 DIAGNOSIS — C3490 Malignant neoplasm of unspecified part of unspecified bronchus or lung: Secondary | ICD-10-CM

## 2018-02-19 DIAGNOSIS — R630 Anorexia: Secondary | ICD-10-CM

## 2018-02-19 LAB — CBC WITH DIFFERENTIAL (CANCER CENTER ONLY)
Abs Immature Granulocytes: 0.01 10*3/uL (ref 0.00–0.07)
BASOS ABS: 0 10*3/uL (ref 0.0–0.1)
Basophils Relative: 0 %
EOS ABS: 0.1 10*3/uL (ref 0.0–0.5)
EOS PCT: 1 %
HEMATOCRIT: 41.1 % (ref 36.0–46.0)
Hemoglobin: 13.8 g/dL (ref 12.0–15.0)
IMMATURE GRANULOCYTES: 0 %
LYMPHS ABS: 1.1 10*3/uL (ref 0.7–4.0)
Lymphocytes Relative: 23 %
MCH: 32.2 pg (ref 26.0–34.0)
MCHC: 33.6 g/dL (ref 30.0–36.0)
MCV: 96 fL (ref 80.0–100.0)
Monocytes Absolute: 0.4 10*3/uL (ref 0.1–1.0)
Monocytes Relative: 9 %
NEUTROS PCT: 67 %
NRBC: 0 % (ref 0.0–0.2)
Neutro Abs: 3.1 10*3/uL (ref 1.7–7.7)
PLATELETS: 268 10*3/uL (ref 150–400)
RBC: 4.28 MIL/uL (ref 3.87–5.11)
RDW: 12.9 % (ref 11.5–15.5)
WBC Count: 4.7 10*3/uL (ref 4.0–10.5)

## 2018-02-19 LAB — CMP (CANCER CENTER ONLY)
ALK PHOS: 53 U/L (ref 38–126)
ALT: 23 U/L (ref 0–44)
ANION GAP: 9 (ref 5–15)
AST: 22 U/L (ref 15–41)
Albumin: 3.8 g/dL (ref 3.5–5.0)
BUN: 13 mg/dL (ref 6–20)
CALCIUM: 9.1 mg/dL (ref 8.9–10.3)
CO2: 28 mmol/L (ref 22–32)
CREATININE: 0.76 mg/dL (ref 0.44–1.00)
Chloride: 103 mmol/L (ref 98–111)
GFR, Estimated: >60 mL/min (ref >60)
Glucose, Bld: 79 mg/dL (ref 70–99)
Potassium: 4.3 mmol/L (ref 3.5–5.1)
SODIUM: 140 mmol/L (ref 135–145)
TOTAL PROTEIN: 6.8 g/dL (ref 6.5–8.1)
Total Bilirubin: 0.5 mg/dL (ref 0.3–1.2)

## 2018-02-19 MED ORDER — DENOSUMAB 120 MG/1.7ML ~~LOC~~ SOLN
120.0000 mg | Freq: Once | SUBCUTANEOUS | Status: AC
  Administered 2018-02-19: 120 mg via SUBCUTANEOUS

## 2018-02-19 MED ORDER — DENOSUMAB 120 MG/1.7ML ~~LOC~~ SOLN
SUBCUTANEOUS | Status: AC
  Filled 2018-02-19: qty 1.7

## 2018-02-19 NOTE — Patient Instructions (Signed)

## 2018-02-19 NOTE — Progress Notes (Signed)
[facility] [phone]   [phone]  OFFICE PROGRESS NOTE  [name], MD [place] [address]  DIAGNOSIS: Stage IV (T2a, N1, M1b) non-small cell lung cancer, adenocarcinoma with positive EGFR mutation (deletion 74) presented with right middle lobe lung mass, right hilar adenopathy as well as metastatic disease to the bone, brain and left adrenal diagnosed in July 2016.  PRIOR THERAPY: 1) palliative radiotherapy to the brain as well as metastatic bone lesions in the pelvis. 2) Tarceva 150 mg by mouth daily started on 09/25/2014. Status post 5 months of treatment. 3) status post palliative radiotherapy to the right middle lobe lung mass for treatment of hemoptysis. 4) Tarceva 100 mg by mouth daily started 02/19/2015 status post 13 months of treatment. This was discontinued in May 2018 after the patient had disease progression and development of T790M resistant mutation.  5) palliative stereotactic radiotherapy to the left adrenal gland metastatic lesion under the care of Dr. Lisbeth Renshaw completed on April 10, 2017.  CURRENT THERAPY: 1) Tagrisso 80 mg by mouth daily started 06/02/2016.  Status post 19 months of treatment. 2) Xgeva 120 g subcutaneously every 6 weeks.  INTERVAL HISTORY: Deborah Foster 48 y.o. female returns to the clinic today for follow-up visit accompanied by her interpreter.  The patient is feeling fine today with no concerning complaints except for intermittent pain in the right scapula and she takes Tylenol on as-needed basis.  She also had poking left lower quadrant pain.  And few episodes of diarrhea.  She denied having any nausea or vomiting.  She denied having any fever or chills.  She denied having any chest pain, shortness of breath, cough or hemoptysis.  She continues to tolerate her treatment with Tagrisso fairly well.  She lost 2-3 pounds since her last visit because of lack of appetite.  The patient  is here today for evaluation and repeat blood work.  MEDICAL HISTORY: Past Medical History:  Diagnosis Date  . Bone metastasis ([place])   . Hemoptysis   . Hypokalemia   . lung ca dx'd 07/2014  . Lung mass   . Metastasis to adrenal gland ([place])   . Metastasis to brain ([place])   . Pneumonia   . Radiation 08/23/14-09/07/14   Brain/chest and left hip 30 Gy 12 Fx  . URI (upper respiratory infection) 02-02-2015    ALLERGIES:  is allergic to other and doxycycline.  MEDICATIONS:  Current Outpatient Medications  Medication Sig Dispense Refill  . AMBULATORY NON FORMULARY MEDICATION Medication Name: MAGIC MOUTHWASH 2 % Viscous Lidocaine  Maalox Benadryl Susp.  Disp: 1:1:1 250m bottle Sig: 167mPO Swish & Spit  q 3-4hrs. PRN mouth sores (Patient not taking: Reported on 07/29/2017) 200 mL 3  . azithromycin (ZITHROMAX) 250 MG tablet Take by mouth daily.    . benzonatate (TESSALON) 200 MG capsule TK ONE C PO Q 8 H PRN COU  0  . Calcium Carbonate-Vitamin D (CALCIUM 500 + D) 500-125 MG-UNIT TABS Take 500 mg by mouth 2 (two) times daily.    . chlorpheniramine-HYDROcodone (TUSSIONEX) 10-8 MG/5ML SUER Take 5 mLs by mouth every 12 (twelve) hours as needed. 140 mL 0  . clindamycin (CLINDAGEL) 1 % gel Apply 1 application topically 2 (two) times daily.    . clobetasol cream (TEMOVATE) 0.2.70 Apply 1 application topically 2 (two) times daily.    . Cyanocobalamin (VITAMIN B-12) 2500 MCG SUBL Place 2,500 mcg under the tongue daily.    .Marland Kitchen  Dextromethorphan Polistirex (DELSYM PO) Take by mouth.    . ENSURE (ENSURE) Take 1 Can by mouth 2 (two) times daily between meals.     . ferrous sulfate 325 (65 FE) MG EC tablet Take 325 mg by mouth daily with breakfast.    . fluticasone (VERAMYST) 27.5 MCG/SPRAY nasal spray Place 2 sprays into the nose daily.    . folic acid (FOLVITE) 048 MCG tablet Take 1 tablet (800 mcg total) by mouth daily. 30 tablet 1  . loratadine (CLARITIN) 10 MG tablet Take 1 tablet (10 mg total) by  mouth daily. 30 tablet 0  . Nutritional Supplements (JUICE PLUS FIBRE PO) Take 1 capsule by mouth daily. Reported on 07/28/2015    . omeprazole (PRILOSEC) 20 MG capsule Take 20 mg by mouth daily.    Marland Kitchen osimertinib mesylate (TAGRISSO) 80 MG tablet Take 1 tablet (80 mg total) by mouth daily. 30 tablet 2  . OVER THE COUNTER MEDICATION "Deep penetrating pain relief oil"    . prochlorperazine (COMPAZINE) 10 MG tablet Take 1 tablet (10 mg total) by mouth every 6 (six) hours as needed for nausea or vomiting. 30 tablet 0  . ranitidine (ZANTAC) 300 MG tablet Take 1 tablet (300 mg total) by mouth at bedtime. 30 tablet 6  . TURMERIC PO Take 1 capsule by mouth 2 (two) times daily. Turmeric Superior 750 mg po bid     No current facility-administered medications for this visit.     SURGICAL HISTORY:  Past Surgical History:  Procedure Laterality Date  . IR GENERIC HISTORICAL  07/14/2015   IR RADIOLOGIST EVAL & MGMT 07/14/2015 Greggory Keen, MD [facility] INTERV RAD  . VIDEO BRONCHOSCOPY Bilateral 08/09/2014   Procedure: VIDEO BRONCHOSCOPY WITHOUT FLUORO;  Surgeon: [name], MD;  Location: [facility] END[facility]COPY;  Service: Cardiopulmonary;  Laterality: Bilateral;  . VIDEO BRONCHOSCOPY Bilateral 07/06/2015   Procedure: VIDEO BRONCHOSCOPY WITHOUT FLUORO;  Surgeon: [name], MD;  Location: [facility] ENDOSCOPY;  Service: Cardiopulmonary;  Laterality: Bilateral;    REVIEW OF SYSTEMS:  A comprehensive review of systems was negative except for: Constitutional: positive for weight loss Gastrointestinal: positive for abdominal pain and diarrhea Musculoskeletal: positive for bone pain   PHYSICAL EXAMINATION: General appearance: alert, cooperative and no distress Head: Normocephalic, without obvious abnormality, atraumatic Neck: no adenopathy, no JVD, supple, symmetrical, trachea midline and thyroid not enlarged, symmetric, no tenderness/mass/nodules Lymph nodes: Cervical, supraclavicular, and axillary nodes  normal. Resp: clear to auscultation bilaterally Back: symmetric, no curvature. ROM normal. No CVA tenderness. Cardio: regular rate and rhythm, S1, S2 normal, no murmur, click, rub or gallop GI: soft, non-tender; bowel sounds normal; no masses,  no organomegaly Extremities: extremities normal, atraumatic, no cyanosis or edema  ECOG PERFORMANCE STATUS: 1 - Symptomatic but completely ambulatory  Blood pressure (!) 98/52, pulse (!) 102, temperature 98.3 F (36.8 C), temperature source Oral, resp. rate 18, height '4\' 11"'$  (1.499 m), weight 105 lb 4.8 oz (47.8 kg), SpO2 100 %.  LABORATORY DATA: Lab Results  Component Value Date   WBC 4.7 02/19/2018   HGB 13.8 02/19/2018   HCT 41.1 02/19/2018   MCV 96.0 02/19/2018   PLT 268 02/19/2018      Chemistry      Component Value Date/Time   NA 137 01/07/2018 0738   NA 140 01/17/2017 1003   K 3.9 01/07/2018 0738   K 3.9 01/17/2017 1003   CL 104 01/07/2018 0738   CO2 23 01/07/2018 0738   CO2 29 01/17/2017 1003  BUN 11 01/07/2018 0738   BUN 14.8 01/17/2017 1003   CREATININE 0.74 01/07/2018 0738   CREATININE 0.8 01/17/2017 1003      Component Value Date/Time   CALCIUM 8.7 (L) 01/07/2018 0738   CALCIUM 8.7 01/17/2017 1003   ALKPHOS 49 01/07/2018 0738   ALKPHOS 50 01/17/2017 1003   AST 19 01/07/2018 0738   AST 23 01/17/2017 1003   ALT 24 01/07/2018 0738   ALT 27 01/17/2017 1003   BILITOT 0.4 01/07/2018 0738   BILITOT 0.46 01/17/2017 1003       RADIOGRAPHIC STUDIES: Mr Jeri Cos EL Contrast  Result Date: 01/24/2018 CLINICAL DATA:  Restrained cerebral metastatic disease with whole-brain radiotherapy in 2016 EXAM: MRI HEAD WITHOUT AND WITH CONTRAST TECHNIQUE: Multiplanar, multiecho pulse sequences of the brain and surrounding structures were obtained without and with intravenous contrast. CONTRAST:  64m MULTIHANCE GADOBENATE DIMEGLUMINE 529 MG/ML IV SOLN COMPARISON:  07/25/2017 FINDINGS: BRAIN New Lesions: None. Larger lesions: None.  Stable or Smaller lesions: There are treated brain metastases showing chronic blood products including T1 shortening. 1. Subcentimeter lesion located central left cerebellum and seen on 10:36. 2. 18 mm lesion located posterior left temporal white matter and seen on 10:58. There is increased but still mild vasogenic edema about this lesion. 3. Subcentimeter lesion located low right parietal lobe and seen on 10:62. 4. Subcentimeter lesion located right occipital cortex and seen on 10:67. 5. 9 mm lesion located left anterior frontal lobe and seen on 10:87. 6. Subcentimeter lesion located right frontal paracentral and seen on 10:93. 7. Subcentimeter lesion located right parietal and seen on 10:104. Other Brain findings: No incidental infarct, hemorrhage, hydrocephalus, or mass effect. Vascular: Major flow voids and vascular enhancements are preserved Skull and upper cervical spine: Negative for marrow lesion Sinuses/Orbits: Negative IMPRESSION: 1. Stable treated brain metastases. No evidence of new or progressive metastatic disease. 2. Mild and mildly increased vasogenic edema about the largest treated lesion in the left temporal lobe. Electronically Signed   By: JMonte FantasiaM.D.   On: 01/24/2018 11:29    ASSESSMENT AND PLAN:  This is a very pleasant 48years old Asian female with stage IV non-small cell lung cancer, adenocarcinoma with positive EGFR mutation with deletion in exon 174diagnosed in July 2016 status post treatment with Tarceva for a total of 18 months but this was discontinued secondary to disease progression and development of T790M resistant mutation. The patient was started on treatment with Tagrisso 80 mg by mouth daily status post 21 months of treatment. She continues to tolerate her treatment well except for few episodes of diarrhea. I recommended for the patient to continue her current treatment with Tagrisso with the same dose. I will see her back for follow-up visit in 1 months for  evaluation with repeat CT scan of the chest, abdomen and pelvis for restaging of her disease. For the metastatic bone disease, she will continue her current treatment with Xgeva every 6 weeks.  She was advised to call immediately if she has any concerning symptoms in the interval. The patient voices understanding of current disease status and treatment options and is in agreement with the current care plan. All questions were answered. The patient knows to call the clinic with any problems, questions or concerns. We can certainly see the patient much sooner if necessary.  Disclaimer: This note was dictated with voice recognition software. Similar sounding words can inadvertently be transcribed and may not be corrected upon review.

## 2018-02-19 NOTE — Telephone Encounter (Signed)
Scheduled appt per 02/05 los.  Printed calendar and avs.

## 2018-02-25 ENCOUNTER — Telehealth: Payer: Self-pay | Admitting: Internal Medicine

## 2018-02-25 NOTE — Telephone Encounter (Signed)
Left message for patient per 2/5 sch message - per Deborah Foster - no nutrition appt is need if pt needs more ensure she can just walk in to get it .

## 2018-03-12 MED FILL — TAGRISSO 80 MG TABLET: 80 | 30 days supply | Qty: 30 | Fill #9

## 2018-03-18 ENCOUNTER — Ambulatory Visit (HOSPITAL_COMMUNITY)
Admission: RE | Admit: 2018-03-18 | Discharge: 2018-03-18 | Disposition: A | Discharge status: Home / Self Care | Payer: Medicare Other | Source: Ambulatory Visit | Attending: Internal Medicine | Admitting: Internal Medicine

## 2018-03-18 ENCOUNTER — Inpatient Hospital Stay: Payer: Medicare Other | Attending: Internal Medicine

## 2018-03-18 DIAGNOSIS — R590 Localized enlarged lymph nodes: Secondary | ICD-10-CM | POA: Diagnosis not present

## 2018-03-18 DIAGNOSIS — M79662 Pain in left lower leg: Secondary | ICD-10-CM | POA: Diagnosis not present

## 2018-03-18 DIAGNOSIS — R630 Anorexia: Secondary | ICD-10-CM | POA: Diagnosis not present

## 2018-03-18 DIAGNOSIS — R5383 Other fatigue: Secondary | ICD-10-CM | POA: Diagnosis not present

## 2018-03-18 DIAGNOSIS — C3490 Malignant neoplasm of unspecified part of unspecified bronchus or lung: Secondary | ICD-10-CM

## 2018-03-18 DIAGNOSIS — Z923 Personal history of irradiation: Secondary | ICD-10-CM | POA: Diagnosis not present

## 2018-03-18 DIAGNOSIS — C7972 Secondary malignant neoplasm of left adrenal gland: Secondary | ICD-10-CM | POA: Insufficient documentation

## 2018-03-18 DIAGNOSIS — C7931 Secondary malignant neoplasm of brain: Secondary | ICD-10-CM | POA: Insufficient documentation

## 2018-03-18 DIAGNOSIS — L298 Other pruritus: Secondary | ICD-10-CM | POA: Diagnosis not present

## 2018-03-18 DIAGNOSIS — C342 Malignant neoplasm of middle lobe, bronchus or lung: Secondary | ICD-10-CM | POA: Diagnosis present

## 2018-03-18 DIAGNOSIS — F5102 Adjustment insomnia: Secondary | ICD-10-CM | POA: Insufficient documentation

## 2018-03-18 DIAGNOSIS — R110 Nausea: Secondary | ICD-10-CM | POA: Insufficient documentation

## 2018-03-18 DIAGNOSIS — C7951 Secondary malignant neoplasm of bone: Secondary | ICD-10-CM | POA: Insufficient documentation

## 2018-03-18 DIAGNOSIS — Z79899 Other long term (current) drug therapy: Secondary | ICD-10-CM | POA: Insufficient documentation

## 2018-03-18 LAB — CMP (CANCER CENTER ONLY)
ALT: 23 U/L (ref 0–44)
AST: 22 U/L (ref 15–41)
Albumin: 3.9 g/dL (ref 3.5–5.0)
Alkaline Phosphatase: 67 U/L (ref 38–126)
Anion gap: 13 (ref 5–15)
BUN: 11 mg/dL (ref 6–20)
CO2: 27 mmol/L (ref 22–32)
CREATININE: 0.78 mg/dL (ref 0.44–1.00)
Calcium: 8.6 mg/dL — ABNORMAL LOW (ref 8.9–10.3)
Chloride: 102 mmol/L (ref 98–111)
GFR, Est AFR Am: >60 mL/min (ref >60)
GFR, Estimated: >60 mL/min (ref >60)
Glucose, Bld: 84 mg/dL (ref 70–99)
Potassium: 4.4 mmol/L (ref 3.5–5.1)
Sodium: 142 mmol/L (ref 135–145)
Total Bilirubin: 0.4 mg/dL (ref 0.3–1.2)
Total Protein: 7.5 g/dL (ref 6.5–8.1)

## 2018-03-18 LAB — CBC WITH DIFFERENTIAL (CANCER CENTER ONLY)
Abs Immature Granulocytes: 0.02 10*3/uL (ref 0.00–0.07)
Basophils Absolute: 0 10*3/uL (ref 0.0–0.1)
Basophils Relative: 0 %
Eosinophils Absolute: 0.1 10*3/uL (ref 0.0–0.5)
Eosinophils Relative: 2 %
HCT: 42.4 % (ref 36.0–46.0)
Hemoglobin: 13.8 g/dL (ref 12.0–15.0)
Immature Granulocytes: 0 %
Lymphocytes Relative: 23 %
Lymphs Abs: 1.2 10*3/uL (ref 0.7–4.0)
MCH: 31.1 pg (ref 26.0–34.0)
MCHC: 32.5 g/dL (ref 30.0–36.0)
MCV: 95.5 fL (ref 80.0–100.0)
MONO ABS: 0.5 10*3/uL (ref 0.1–1.0)
Monocytes Relative: 11 %
Neutro Abs: 3.3 10*3/uL (ref 1.7–7.7)
Neutrophils Relative %: 64 %
Platelet Count: 272 10*3/uL (ref 150–400)
RBC: 4.44 MIL/uL (ref 3.87–5.11)
RDW: 12.7 % (ref 11.5–15.5)
WBC Count: 5.1 10*3/uL (ref 4.0–10.5)
nRBC: 0 % (ref 0.0–0.2)

## 2018-03-18 MED ORDER — IOHEXOL 300 MG/ML  SOLN
100.0000 mL | Freq: Once | INTRAMUSCULAR | Status: AC | PRN
  Administered 2018-03-18: 100 mL via INTRAVENOUS

## 2018-03-18 MED ORDER — SODIUM CHLORIDE (PF) 0.9 % IJ SOLN
INTRAMUSCULAR | Status: AC
  Filled 2018-03-18: qty 50

## 2018-03-20 ENCOUNTER — Inpatient Hospital Stay: Payer: Medicare Other

## 2018-03-20 ENCOUNTER — Inpatient Hospital Stay (HOSPITAL_BASED_OUTPATIENT_CLINIC_OR_DEPARTMENT_OTHER): Payer: Medicare Other | Admitting: Physician Assistant

## 2018-03-20 ENCOUNTER — Telehealth: Payer: Self-pay | Admitting: Physician Assistant

## 2018-03-20 ENCOUNTER — Other Ambulatory Visit: Payer: Self-pay

## 2018-03-20 VITALS — BP 109/62 | HR 111 | Temp 97.5°F | Resp 18 | Ht 59.0 in | Wt 106.2 lb

## 2018-03-20 DIAGNOSIS — C342 Malignant neoplasm of middle lobe, bronchus or lung: Secondary | ICD-10-CM | POA: Diagnosis not present

## 2018-03-20 DIAGNOSIS — R630 Anorexia: Secondary | ICD-10-CM

## 2018-03-20 DIAGNOSIS — F5102 Adjustment insomnia: Secondary | ICD-10-CM

## 2018-03-20 DIAGNOSIS — C7972 Secondary malignant neoplasm of left adrenal gland: Secondary | ICD-10-CM

## 2018-03-20 DIAGNOSIS — C7951 Secondary malignant neoplasm of bone: Secondary | ICD-10-CM | POA: Diagnosis not present

## 2018-03-20 DIAGNOSIS — M898X9 Other specified disorders of bone, unspecified site: Secondary | ICD-10-CM

## 2018-03-20 DIAGNOSIS — Z79899 Other long term (current) drug therapy: Secondary | ICD-10-CM

## 2018-03-20 DIAGNOSIS — G479 Sleep disorder, unspecified: Secondary | ICD-10-CM

## 2018-03-20 DIAGNOSIS — C3491 Malignant neoplasm of unspecified part of right bronchus or lung: Secondary | ICD-10-CM

## 2018-03-20 DIAGNOSIS — C7931 Secondary malignant neoplasm of brain: Secondary | ICD-10-CM | POA: Diagnosis not present

## 2018-03-20 DIAGNOSIS — C3431 Malignant neoplasm of lower lobe, right bronchus or lung: Secondary | ICD-10-CM

## 2018-03-20 DIAGNOSIS — R590 Localized enlarged lymph nodes: Secondary | ICD-10-CM

## 2018-03-20 DIAGNOSIS — L298 Other pruritus: Secondary | ICD-10-CM

## 2018-03-20 DIAGNOSIS — R110 Nausea: Secondary | ICD-10-CM

## 2018-03-20 DIAGNOSIS — Z923 Personal history of irradiation: Secondary | ICD-10-CM

## 2018-03-20 DIAGNOSIS — E889 Metabolic disorder, unspecified: Secondary | ICD-10-CM

## 2018-03-20 DIAGNOSIS — M9080 Osteopathy in diseases classified elsewhere, unspecified site: Secondary | ICD-10-CM

## 2018-03-20 DIAGNOSIS — Z5111 Encounter for antineoplastic chemotherapy: Secondary | ICD-10-CM

## 2018-03-20 MED ORDER — MIRTAZAPINE 15 MG PO TABS: 15.0000 mg | ORAL_TABLET | Freq: Every day | ORAL | 0 refills | Status: DC

## 2018-03-20 MED ORDER — DENOSUMAB 120 MG/1.7ML ~~LOC~~ SOLN: 120.0000 mg | Freq: Once | SUBCUTANEOUS | Status: DC

## 2018-03-20 MED FILL — MIRTAZAPINE 15 MG TABLET: 15 | 30 days supply | Qty: 30 | Fill #0

## 2018-03-20 NOTE — Telephone Encounter (Signed)
Gave avs and calendar ° °

## 2018-03-20 NOTE — Progress Notes (Signed)
[place] OFFICE PROGRESS NOTE  [name], MD [place] [address]  DIAGNOSIS: Stage IV (T2a, N1, M1b) non-small cell lung cancer, adenocarcinoma with positive EGFR mutation (deletion 73) presented with right middle lobe lung mass, right hilar adenopathy as well as metastatic disease to the bone, brain and left adrenal diagnosed in July 2016.  PRIOR THERAPY: 1) palliative radiotherapy to the brain as well as metastatic bone lesions in the pelvis. 2) Tarceva 150 mg by mouth daily started on 09/25/2014. Status post 5 months of treatment. 3) status post palliative radiotherapy to the right middle lobe lung mass for treatment of hemoptysis. 4) Tarceva 100 mg by mouth daily started 02/19/2015 status post 13 months of treatment. This was discontinued in May 2018 after the patient had disease progression and development of T790M resistant mutation.  5) palliative stereotactic radiotherapy to the left adrenal gland metastatic lesion under the care of Dr. Lisbeth Renshaw completed on April 10, 2017.  CURRENT THERAPY: 1) Tagrisso 80 mg by mouth daily started 06/02/2016.  Status post 22 months of treatment. 2) Xgeva 120 g subcutaneously every 6 weeks.  INTERVAL HISTORY: Deborah Foster 48 y.o. female returns to clinic today for a follow-up visit accompanied by her interpreter. The patient is feeling ok today. She has been having trouble sleeping secondary to financial stress and a lack of appetite recently. She denies any weight loss and she supplements her diet with ensure twice a day. She additionally continues to express concern for occassional headaches and double vision over the last several months. She is followed closely by radiation oncology for her brain metastasis. Otherwise, she continues to tolerate treatment with Tagrisso well except for some nausea and itching. She manages her nausea with compazine. She denies any fever, chills, or night sweats. She denies  any chest pain, shortness of breath, or hemoptysis. She states she always has a baseline cough but it has improved. She denies constipation and diarrhea. She recently underwent a restaging CT scan and she is here today for evaluation and to discuss her scan results.  MEDICAL HISTORY: Past Medical History:  Diagnosis Date  . Bone metastasis ([place])   . Hemoptysis   . Hypokalemia   . lung ca dx'd 07/2014  . Lung mass   . Metastasis to adrenal gland ([place])   . Metastasis to brain ([place])   . Pneumonia   . Radiation 08/23/14-09/07/14   Brain/chest and left hip 30 Gy 12 Fx  . URI (upper respiratory infection) 02-02-2015    ALLERGIES:  is allergic to other and doxycycline.  MEDICATIONS:  Current Outpatient Medications  Medication Sig Dispense Refill  . AMBULATORY NON FORMULARY MEDICATION Medication Name: MAGIC MOUTHWASH 2 % Viscous Lidocaine  Maalox Benadryl Susp.  Disp: 1:1:1 263m bottle Sig: 130mPO Swish & Spit  q 3-4hrs. PRN mouth sores (Patient not taking: Reported on 07/29/2017) 200 mL 3  . benzonatate (TESSALON) 200 MG capsule TK ONE C PO Q 8 H PRN COU  0  . Calcium Carbonate-Vitamin D (CALCIUM 500 + D) 500-125 MG-UNIT TABS Take 500 mg by mouth 2 (two) times daily.    . chlorpheniramine-HYDROcodone (TUSSIONEX) 10-8 MG/5ML SUER Take 5 mLs by mouth every 12 (twelve) hours as needed. 140 mL 0  . clindamycin (CLINDAGEL) 1 % gel Apply 1 application topically 2 (two) times daily.    . clobetasol cream (TEMOVATE) 0.2.45 Apply 1 application topically 2 (two) times daily.    . Cyanocobalamin (VITAMIN B-12)  2500 MCG SUBL Place 2,500 mcg under the tongue daily.    Marland Kitchen Dextromethorphan Polistirex (DELSYM PO) Take by mouth.    . ENSURE (ENSURE) Take 1 Can by mouth 2 (two) times daily between meals.     . ferrous sulfate 325 (65 FE) MG EC tablet Take 325 mg by mouth daily with breakfast.    . fluticasone (VERAMYST) 27.5 MCG/SPRAY nasal spray Place 2 sprays into the nose daily.    . folic acid  (FOLVITE) 884 MCG tablet Take 1 tablet (800 mcg total) by mouth daily. 30 tablet 1  . loratadine (CLARITIN) 10 MG tablet Take 1 tablet (10 mg total) by mouth daily. 30 tablet 0  . mirtazapine (REMERON) 15 MG tablet Take 1 tablet (15 mg total) by mouth at bedtime. 30 tablet 0  . Nutritional Supplements (JUICE PLUS FIBRE PO) Take 1 capsule by mouth daily. Reported on 07/28/2015    . omeprazole (PRILOSEC) 20 MG capsule Take 20 mg by mouth daily.    Marland Kitchen osimertinib mesylate (TAGRISSO) 80 MG tablet Take 1 tablet (80 mg total) by mouth daily. 30 tablet 2  . OVER THE COUNTER MEDICATION "Deep penetrating pain relief oil"    . prochlorperazine (COMPAZINE) 10 MG tablet Take 1 tablet (10 mg total) by mouth every 6 (six) hours as needed for nausea or vomiting. 30 tablet 0  . ranitidine (ZANTAC) 300 MG tablet Take 1 tablet (300 mg total) by mouth at bedtime. 30 tablet 6  . TURMERIC PO Take 1 capsule by mouth 2 (two) times daily. Turmeric Superior 750 mg po bid     No current facility-administered medications for this visit.     SURGICAL HISTORY:  Past Surgical History:  Procedure Laterality Date  . IR GENERIC HISTORICAL  07/14/2015   IR RADIOLOGIST EVAL & MGMT 07/14/2015 Greggory Keen, MD [facility] INTERV RAD  . VIDEO BRONCHOSCOPY Bilateral 08/09/2014   Procedure: VIDEO BRONCHOSCOPY WITHOUT FLUORO;  Surgeon: [name], MD;  Location: [facility] ENDOSCOPY;  Service: Cardiopulmonary;  Laterality: Bilateral;  . VIDEO BRONCHOSCOPY Bilateral 07/06/2015   Procedure: VIDEO BRONCHOSCOPY WITHOUT FLUORO;  Surgeon: [name], MD;  Location: [facility] ENDOSCOPY;  Service: Cardiopulmonary;  Laterality: Bilateral;    REVIEW OF SYSTEMS:   Review of Systems  Constitutional: Positive for decreased oral intake. Negative for chills, fatigue, fever and unexpected weight change.  HENT:  Negative for mouth sores, nosebleeds, sore throat and trouble swallowing.   Eyes: Positive for continued and occasional diplopia and headache.  Negative for icterus.  Respiratory: Negative for cough, hemoptysis, shortness of breath and wheezing.   Cardiovascular: Negative for chest pain and leg swelling.  Gastrointestinal: Positive for nausea. Negative for vomiting, diarrhea, and constipation. Genitourinary: Negative for bladder incontinence, difficulty urinating, dysuria, frequency and hematuria.   Musculoskeletal: Negative for back pain, gait problem, neck pain and neck stiffness.  Skin: Positive for itching. Negative for rash.  Neurological: Negative for dizziness, extremity weakness, gait problem, headaches, light-headedness and seizures.  Hematological: Negative for adenopathy. Does not bruise/bleed easily.  Psychiatric/Behavioral: Negative for confusion, depression and sleep disturbance. The patient is not nervous/anxious.     PHYSICAL EXAMINATION:  Blood pressure 109/62, pulse (!) 111, temperature (!) 97.5 F (36.4 C), temperature source Oral, resp. rate 18, height 4' 11" (1.499 m), weight 106 lb 3.2 oz (48.2 kg), SpO2 100 %.  ECOG PERFORMANCE STATUS: 1 - Symptomatic but completely ambulatory  Physical Exam  Constitutional: Oriented to person, place, and time and well-developed, well-nourished, and in no distress.  No distress.  HENT:  Head: Normocephalic and atraumatic.  Mouth/Throat: Oropharynx is clear and moist. No oropharyngeal exudate.  Eyes: Conjunctivae are normal. Right eye exhibits no discharge. Left eye exhibits no discharge. No scleral icterus.  Neck: Normal range of motion. Neck supple.  Cardiovascular: Normal rate, regular rhythm, normal heart sounds and intact distal pulses.   Pulmonary/Chest: Effort normal and breath sounds normal. No respiratory distress. No wheezes. No rales.  Abdominal: Soft. Bowel sounds are normal. Exhibits no distension and no mass. There is no tenderness.  Musculoskeletal: Normal range of motion. Exhibits no edema.  Lymphadenopathy:    No cervical adenopathy.  Neurological: Alert  and oriented to person, place, and time. Exhibits normal muscle tone. Gait normal. Coordination normal.  Skin: Skin is warm and dry. No rash noted. Not diaphoretic. No erythema. No pallor.  Psychiatric: Mood, memory and judgment normal.  Vitals reviewed.  LABORATORY DATA: Lab Results  Component Value Date   WBC 5.1 03/18/2018   HGB 13.8 03/18/2018   HCT 42.4 03/18/2018   MCV 95.5 03/18/2018   PLT 272 03/18/2018      Chemistry      Component Value Date/Time   NA 142 03/18/2018 0737   NA 140 01/17/2017 1003   K 4.4 03/18/2018 0737   K 3.9 01/17/2017 1003   CL 102 03/18/2018 0737   CO2 27 03/18/2018 0737   CO2 29 01/17/2017 1003   BUN 11 03/18/2018 0737   BUN 14.8 01/17/2017 1003   CREATININE 0.78 03/18/2018 0737   CREATININE 0.8 01/17/2017 1003      Component Value Date/Time   CALCIUM 8.6 (L) 03/18/2018 0737   CALCIUM 8.7 01/17/2017 1003   ALKPHOS 67 03/18/2018 0737   ALKPHOS 50 01/17/2017 1003   AST 22 03/18/2018 0737   AST 23 01/17/2017 1003   ALT 23 03/18/2018 0737   ALT 27 01/17/2017 1003   BILITOT 0.4 03/18/2018 0737   BILITOT 0.46 01/17/2017 1003       RADIOGRAPHIC STUDIES:  Ct Chest W Contrast  Result Date: 03/18/2018 CLINICAL DATA:  Non-small cell lung cancer. EXAM: CT CHEST, ABDOMEN, AND PELVIS WITH CONTRAST TECHNIQUE: Multidetector CT imaging of the chest, abdomen and pelvis was performed following the standard protocol during bolus administration of intravenous contrast. CONTRAST:  189m OMNIPAQUE IOHEXOL 300 MG/ML  SOLN COMPARISON:  11/25/2017 FINDINGS: CT CHEST FINDINGS Cardiovascular: The heart size appears within normal limits. No pericardial effusion identified. Increase caliber of the main pulmonary artery identified which may reflect PA hypertension. Mediastinum/Nodes: Normal appearance of the thyroid gland. The trachea appears patent and is midline. Normal appearance of the esophagus. Within the left supraclavicular region there is been interval  development of multiple small lymph nodes which measure up to 6 mm short axis, image 2/2. Right paratracheal lymph node measures 1.1 cm, image 23/2. Previously 0.7 cm. Lungs/Pleura: No pleural effusion identified. Fibrosis with bronchiectasis, volume loss and masslike architectural distortion within the perihilar right mid lung and medial right lower lobe is again identified. The appearance is unchanged from previous exam. Musculoskeletal: Multifocal areas of abnormal sclerosis within the thoracic spine, ribs and sternum are again noted compatible with osseous metastatic disease. The appearance is unchanged from the previous exam. CT ABDOMEN PELVIS FINDINGS Hepatobiliary: A few small less than 1 cm liver hypodensities are again noted. The appearance is unchanged from 11/25/2017. No new focal liver abnormalities. Gallbladder appears normal. There is fusiform dilatation of the common bile duct which measures 1.5 cm in maximum diameter. Similar  to previous exam. Mild intrahepatic duct dilatation is identified. Pancreas: Unremarkable. No pancreatic ductal dilatation or surrounding inflammatory changes. Spleen: Normal in size without focal abnormality. Adrenals/Urinary Tract: Normal right adrenal gland. Normal left adrenal gland. No kidney mass or hydronephrosis. The urinary bladder is normal. Stomach/Bowel: Stomach is within normal limits. Appendix appears normal. No evidence of bowel wall thickening, distention, or inflammatory changes. Vascular/Lymphatic: Mild aortic atherosclerosis. Interval progression of retroperitoneal nodal metastases: -Left retroperitoneal nodal mass measures 3.2 by 3.1 cm and encases the left renal artery. On the previous exam this measured 1.0 x 1.6 cm. - new left retroperitoneal lymph node measures 1 cm, image 62/2. -aortocaval lymph node measures 1 cm, image 73/2.  New. -at the bifurcation there is a lymph node measuring 1 cm, image 80/2. Reproductive: Uterus and bilateral adnexa are  unremarkable. Other: No free fluid or fluid collections. Musculoskeletal: Multifocal sclerotic bone metastases are again identified involving the bony pelvis, proximal femurs and lumbar spine. The appearance is unchanged from previous exam. IMPRESSION: 1. Interval progression of retroperitoneal adenopathy. 2. Multiple new subcentimeter left supraclavicular lymph nodes and increase size of right paratracheal lymph node. Suspicious for nodal metastases. 3. Stable sclerotic bone metastases. 4. Stable post therapeutic appearance of the right lung without findings to suggest local tumor recurrence. Electronically Signed   By: Kerby Moors M.D.   On: 03/18/2018 10:56   Ct Abdomen Pelvis W Contrast  Result Date: 03/18/2018 CLINICAL DATA:  Non-small cell lung cancer. EXAM: CT CHEST, ABDOMEN, AND PELVIS WITH CONTRAST TECHNIQUE: Multidetector CT imaging of the chest, abdomen and pelvis was performed following the standard protocol during bolus administration of intravenous contrast. CONTRAST:  136m OMNIPAQUE IOHEXOL 300 MG/ML  SOLN COMPARISON:  11/25/2017 FINDINGS: CT CHEST FINDINGS Cardiovascular: The heart size appears within normal limits. No pericardial effusion identified. Increase caliber of the main pulmonary artery identified which may reflect PA hypertension. Mediastinum/Nodes: Normal appearance of the thyroid gland. The trachea appears patent and is midline. Normal appearance of the esophagus. Within the left supraclavicular region there is been interval development of multiple small lymph nodes which measure up to 6 mm short axis, image 2/2. Right paratracheal lymph node measures 1.1 cm, image 23/2. Previously 0.7 cm. Lungs/Pleura: No pleural effusion identified. Fibrosis with bronchiectasis, volume loss and masslike architectural distortion within the perihilar right mid lung and medial right lower lobe is again identified. The appearance is unchanged from previous exam. Musculoskeletal: Multifocal areas of  abnormal sclerosis within the thoracic spine, ribs and sternum are again noted compatible with osseous metastatic disease. The appearance is unchanged from the previous exam. CT ABDOMEN PELVIS FINDINGS Hepatobiliary: A few small less than 1 cm liver hypodensities are again noted. The appearance is unchanged from 11/25/2017. No new focal liver abnormalities. Gallbladder appears normal. There is fusiform dilatation of the common bile duct which measures 1.5 cm in maximum diameter. Similar to previous exam. Mild intrahepatic duct dilatation is identified. Pancreas: Unremarkable. No pancreatic ductal dilatation or surrounding inflammatory changes. Spleen: Normal in size without focal abnormality. Adrenals/Urinary Tract: Normal right adrenal gland. Normal left adrenal gland. No kidney mass or hydronephrosis. The urinary bladder is normal. Stomach/Bowel: Stomach is within normal limits. Appendix appears normal. No evidence of bowel wall thickening, distention, or inflammatory changes. Vascular/Lymphatic: Mild aortic atherosclerosis. Interval progression of retroperitoneal nodal metastases: -Left retroperitoneal nodal mass measures 3.2 by 3.1 cm and encases the left renal artery. On the previous exam this measured 1.0 x 1.6 cm. - new left retroperitoneal lymph node measures  1 cm, image 62/2. -aortocaval lymph node measures 1 cm, image 73/2.  New. -at the bifurcation there is a lymph node measuring 1 cm, image 80/2. Reproductive: Uterus and bilateral adnexa are unremarkable. Other: No free fluid or fluid collections. Musculoskeletal: Multifocal sclerotic bone metastases are again identified involving the bony pelvis, proximal femurs and lumbar spine. The appearance is unchanged from previous exam. IMPRESSION: 1. Interval progression of retroperitoneal adenopathy. 2. Multiple new subcentimeter left supraclavicular lymph nodes and increase size of right paratracheal lymph node. Suspicious for nodal metastases. 3. Stable  sclerotic bone metastases. 4. Stable post therapeutic appearance of the right lung without findings to suggest local tumor recurrence. Electronically Signed   By: Kerby Moors M.D.   On: 03/18/2018 10:56     ASSESSMENT/PLAN:  This is a very pleasant 48 year old Asian female with stage IV non-small cell lung cancer, adenocarcinoma with a positive EGFR mutation exon 19 deletion.  She was diagnosed in July 2016.  She is status post treatment with Tarceva for a total of 18 months but this was discontinued secondary to disease progression and development of a T790M resistant mutation. The patient is currently undergoing treatment with Tagrisso 80 mg by mouth daily she is status post 22 months of treatment.  She recently had a restaging CT scan and is here to discuss the results.  The patient was seen with Dr. Julien Nordmann today.  Dr. Julien Nordmann personally and independently discussed the results with the patient and her interpreter.  Scan results showed Interval progression of retroperitoneal adenopathy as well as multiple new subcentimeter left supraclavicular lymph nodes and increase size of right paratracheal lymph node. Suspicious for nodal metastases Dr. Julien Nordmann had a lengthy discussion with the patient today about her current condition. I will arrange for a CT guided core biopsy of the retroperitoneal lymph node to be performed next week. We will see her back in 2 weeks to discuss the results and treatment options at this time. She was advised to continue taking her Tagrisso 80 mg po daily and we will reevaluate her current condition and possible treatment options pending the results. I will arrange for her to receive her Xgeva injection today. She will get her next treatment in 6 weeks.  For the lack of appetite, trouble sleeping, and increased stress, she was prescribed a low dose 15 mg p.o daily at bedtime of Remeron to assist with these concerns. I have sent a prescription to her pharmacy.  For the  itching, she was advised that she may take benadryl. Discussed with the patient that this could cause sedation.   For her visual concerns, she was advised to continue to follow up with radiation oncology regarding management of her brain metastasis. These symptoms have been present before her last brain MRI and radiation visit and her symptoms are unchanged since that time.  The patient was advised to call immediately if she has any concerning symptoms in the interval. The patient voices understanding of current disease status and treatment options and is in agreement with the current care plan. All questions were answered. The patient knows to call the clinic with any problems, questions or concerns. We can certainly see the patient much sooner if necessary  Orders Placed This Encounter  Procedures  . CT Biopsy    Standing Status:   Future    Standing Expiration Date:   03/20/2019    Order Specific Question:   Lab orders requested (DO NOT place separate lab orders, these will be automatically ordered  during procedure specimen collection):    Answer:   Surgical Pathology    Order Specific Question:   Reason for Exam (SYMPTOM  OR DIAGNOSIS REQUIRED)    Answer:   CT guided core biopsy of retroperitoneal lymph node    Order Specific Question:   Is patient pregnant?    Answer:   No    Order Specific Question:   Preferred imaging location?    Answer:   [facility]    Order Specific Question:   Radiology Contrast Protocol - do NOT remove file path    Answer:   _0 [email or web address]     Cassandra L Heilingoetter, PA-C 03/20/18  ADDENDUM: Hematology/Oncology Attending: I had a face-to-face encounter with the patient.  I recommended her care plan.  This is a very pleasant 48 years old Asian female with metastatic non-small cell lung cancer, adenocarcinoma with positive EGFR mutation in exon 34 diagnosed in July 2016 status post treatment with targeted therapy  initially with Tarceva and currently on Tagrisso.  The patient has been tolerating this treatment well with no concerning adverse effects except for mild skin rash. She has intermittent headache and visual changes over the last few months but her most recent MRI of the brain in January did not show any concerning findings for progression in the brain. The patient had repeat CT scan of the chest, abdomen and pelvis performed recently.  I personally and independently reviewed the scans and discussed the results with the patient today.  Unfortunately her scan showed evidence for disease progression mainly in retroperitoneal lymph nodes as well as small left neck lymph nodes. I recommended for the patient to have CT-guided core biopsy of the enlarging retroperitoneal lymph nodes for confirmation of the same etiology and also to have material for molecular testing. She will continue on her current treatment with Tagrisso for now until we have more information from the molecular studies and the biopsy. For the lack of appetite and depression, I will start the patient on Remeron. For the visual changes she is followed closely by radiation oncology and expected to have MRI of the brain soon by them. She will come back for follow-up visit in 2 weeks. The patient was advised to call immediately if she has any concerning symptoms in the interval. Disclaimer: This note was dictated with voice recognition software. Similar sounding words can inadvertently be transcribed and may be missed upon review. Eilleen Kempf, MD 03/22/18

## 2018-03-20 NOTE — Progress Notes (Signed)
Pt. Did not receive injection today, will return in 2 weeks for injection and Md appointment.

## 2018-03-22 ENCOUNTER — Encounter: Payer: Self-pay | Admitting: Physician Assistant

## 2018-03-28 ENCOUNTER — Other Ambulatory Visit: Payer: Self-pay | Admitting: Radiology

## 2018-03-31 ENCOUNTER — Encounter (HOSPITAL_COMMUNITY): Payer: Self-pay

## 2018-03-31 ENCOUNTER — Ambulatory Visit (HOSPITAL_COMMUNITY)
Admission: RE | Admit: 2018-03-31 | Discharge: 2018-03-31 | Disposition: A | Discharge status: Home / Self Care | Payer: Medicare Other | Source: Ambulatory Visit | Attending: Physician Assistant | Admitting: Physician Assistant

## 2018-03-31 ENCOUNTER — Other Ambulatory Visit: Payer: Self-pay

## 2018-03-31 ENCOUNTER — Other Ambulatory Visit: Payer: Self-pay | Admitting: Physician Assistant

## 2018-03-31 DIAGNOSIS — C7931 Secondary malignant neoplasm of brain: Secondary | ICD-10-CM | POA: Diagnosis not present

## 2018-03-31 DIAGNOSIS — C7951 Secondary malignant neoplasm of bone: Secondary | ICD-10-CM | POA: Diagnosis not present

## 2018-03-31 DIAGNOSIS — Z9109 Other allergy status, other than to drugs and biological substances: Secondary | ICD-10-CM | POA: Diagnosis not present

## 2018-03-31 DIAGNOSIS — C3491 Malignant neoplasm of unspecified part of right bronchus or lung: Secondary | ICD-10-CM | POA: Diagnosis not present

## 2018-03-31 DIAGNOSIS — Z79899 Other long term (current) drug therapy: Secondary | ICD-10-CM | POA: Diagnosis not present

## 2018-03-31 DIAGNOSIS — C7970 Secondary malignant neoplasm of unspecified adrenal gland: Secondary | ICD-10-CM | POA: Insufficient documentation

## 2018-03-31 DIAGNOSIS — Z881 Allergy status to other antibiotic agents status: Secondary | ICD-10-CM | POA: Diagnosis not present

## 2018-03-31 DIAGNOSIS — R590 Localized enlarged lymph nodes: Secondary | ICD-10-CM | POA: Insufficient documentation

## 2018-03-31 LAB — CBC WITH DIFFERENTIAL/PLATELET
ABS IMMATURE GRANULOCYTES: 0.01 10*3/uL (ref 0.00–0.07)
Basophils Absolute: 0 10*3/uL (ref 0.0–0.1)
Basophils Relative: 0 %
Eosinophils Absolute: 0.1 10*3/uL (ref 0.0–0.5)
Eosinophils Relative: 2 %
HCT: 42.3 % (ref 36.0–46.0)
Hemoglobin: 13.8 g/dL (ref 12.0–15.0)
Immature Granulocytes: 0 %
Lymphocytes Relative: 18 %
Lymphs Abs: 1 10*3/uL (ref 0.7–4.0)
MCH: 31.7 pg (ref 26.0–34.0)
MCHC: 32.6 g/dL (ref 30.0–36.0)
MCV: 97.2 fL (ref 80.0–100.0)
Monocytes Absolute: 0.6 10*3/uL (ref 0.1–1.0)
Monocytes Relative: 10 %
Neutro Abs: 3.9 10*3/uL (ref 1.7–7.7)
Neutrophils Relative %: 70 %
Platelets: 234 10*3/uL (ref 150–400)
RBC: 4.35 MIL/uL (ref 3.87–5.11)
RDW: 12.7 % (ref 11.5–15.5)
WBC: 5.5 10*3/uL (ref 4.0–10.5)
nRBC: 0 % (ref 0.0–0.2)

## 2018-03-31 LAB — COMPREHENSIVE METABOLIC PANEL
ALBUMIN: 4.4 g/dL (ref 3.5–5.0)
ALT: 27 U/L (ref 0–44)
AST: 25 U/L (ref 15–41)
Alkaline Phosphatase: 58 U/L (ref 38–126)
Anion gap: 10 (ref 5–15)
BUN: 11 mg/dL (ref 6–20)
CO2: 25 mmol/L (ref 22–32)
CREATININE: 0.73 mg/dL (ref 0.44–1.00)
Calcium: 8.9 mg/dL (ref 8.9–10.3)
Chloride: 104 mmol/L (ref 98–111)
GFR calc Af Amer: >60 mL/min (ref >60)
GFR calc non Af Amer: >60 mL/min (ref >60)
GLUCOSE: 88 mg/dL (ref 70–99)
Potassium: 4.1 mmol/L (ref 3.5–5.1)
Sodium: 139 mmol/L (ref 135–145)
Total Bilirubin: 0.5 mg/dL (ref 0.3–1.2)
Total Protein: 7.8 g/dL (ref 6.5–8.1)

## 2018-03-31 LAB — PROTIME-INR
INR: 0.8 (ref 0.8–1.2)
Prothrombin Time: 11.4 seconds (ref 11.4–15.2)

## 2018-03-31 MED ORDER — SODIUM CHLORIDE 0.9 % IV SOLN
INTRAVENOUS | Status: DC
  Administered 2018-03-31: 10:00:00 via INTRAVENOUS

## 2018-03-31 MED ORDER — LIDOCAINE-EPINEPHRINE (PF) 2 %-1:200000 IJ SOLN
INTRAMUSCULAR | Status: AC
  Filled 2018-03-31: qty 20

## 2018-03-31 NOTE — H&P (Signed)
Chief Complaint: Pt presents today for image guided retroperitoneal lymph node biopsy  Referring Physician(s): Mohamed,M  Supervising Physician: Sandi Mariscal  Patient Status: [facility] - Out-pt  History of Present Illness: Deborah Foster is a 48 y.o. female familiar to IR service from prior right bronchial artery embolization in 2016 with repeat in 2017 and left iliac bone soft tissue mass biopsy in 2016. She has a history of stage IV adenocarcinoma of the right lung with recent follow-up CT revealing: 1. Interval progression of retroperitoneal adenopathy. 2. Multiple new subcentimeter left supraclavicular lymph nodes and increase size of right paratracheal lymph node. Suspicious for nodal metastases. 3. Stable sclerotic bone metastases. 4. Stable post therapeutic appearance of the right lung without findings to suggest local tumor recurrence  She presents today for image guided retroperitoneal lymph node biopsy for further evaluation.   Past Medical History:  Diagnosis Date  . Bone metastasis ([place])   . Hemoptysis   . Hypokalemia   . lung ca dx'd 07/2014  . Lung mass   . Metastasis to adrenal gland ([place])   . Metastasis to brain ([place])   . Pneumonia   . Radiation 08/23/14-09/07/14   Brain/chest and left hip 30 Gy 12 Fx  . URI (upper respiratory infection) 02-02-2015   Past Surgical History:  Procedure Laterality Date  . IR GENERIC HISTORICAL  07/14/2015   IR RADIOLOGIST EVAL & MGMT 07/14/2015 Greggory Keen, MD [facility] INTERV RAD  . VIDEO BRONCHOSCOPY Bilateral 08/09/2014   Procedure: VIDEO BRONCHOSCOPY WITHOUT FLUORO;  Surgeon: [name], MD;  Location: [facility] ENDOSCOPY;  Service: Cardiopulmonary;  Laterality: Bilateral;  . VIDEO BRONCHOSCOPY Bilateral 07/06/2015   Procedure: VIDEO BRONCHOSCOPY WITHOUT FLUORO;  Surgeon: [name], MD;  Location: [facility] ENDOSCOPY;  Service: Cardiopulmonary;  Laterality: Bilateral;      Past Medical History:  Diagnosis Date  . Bone metastasis  ([place])   . Hemoptysis   . Hypokalemia   . lung ca dx'd 07/2014  . Lung mass   . Metastasis to adrenal gland ([place])   . Metastasis to brain ([place])   . Pneumonia   . Radiation 08/23/14-09/07/14   Brain/chest and left hip 30 Gy 12 Fx  . URI (upper respiratory infection) 02-02-2015    Past Surgical History:  Procedure Laterality Date  . IR GENERIC HISTORICAL  07/14/2015   IR RADIOLOGIST EVAL & MGMT 07/14/2015 Greggory Keen, MD [facility] INTERV RAD  . VIDEO BRONCHOSCOPY Bilateral 08/09/2014   Procedure: VIDEO BRONCHOSCOPY WITHOUT FLUORO;  Surgeon: [name], MD;  Location: [facility] ENDOSCOPY;  Service: Cardiopulmonary;  Laterality: Bilateral;  . VIDEO BRONCHOSCOPY Bilateral 07/06/2015   Procedure: VIDEO BRONCHOSCOPY WITHOUT FLUORO;  Surgeon: [name], MD;  Location: [facility] ENDOSCOPY;  Service: Cardiopulmonary;  Laterality: Bilateral;    Allergies: Other and Doxycycline  Medications: Prior to Admission medications   Medication Sig Start Date End Date Taking? Authorizing Provider  AMBULATORY NON FORMULARY MEDICATION Medication Name: MAGIC MOUTHWASH 2 % Viscous Lidocaine  Maalox Benadryl Susp.  Disp: 1:1:1 271ml bottle Sig: 64ml PO Swish & Spit  q 3-4hrs. PRN mouth sores Patient not taking: Reported on 07/29/2017 07/05/16   [name], MD  benzonatate (TESSALON) 200 MG capsule TK ONE C PO Q 8 H PRN COU 12/14/17   [provider]  Calcium Carbonate-Vitamin D (CALCIUM 500 + D) 500-125 MG-UNIT TABS Take 500 mg by mouth 2 (two) times daily.    [provider]  chlorpheniramine-HYDROcodone (TUSSIONEX) 10-8 MG/5ML SUER Take 5 mLs by mouth  every 12 (twelve) hours as needed. 01/26/16   [name], MD  clindamycin (CLINDAGEL) 1 % gel Apply 1 application topically 2 (two) times daily.    [provider]  clobetasol cream (TEMOVATE) 3.22 % Apply 1 application topically 2 (two) times daily.    [provider]  Cyanocobalamin (VITAMIN B-12) 2500 MCG SUBL  Place 2,500 mcg under the tongue daily.    [provider]  Dextromethorphan Polistirex (DELSYM PO) Take by mouth.    [provider]  ENSURE (ENSURE) Take 1 Can by mouth 2 (two) times daily between meals.     [provider]  ferrous sulfate 325 (65 FE) MG EC tablet Take 325 mg by mouth daily with breakfast.    [provider]  fluticasone (VERAMYST) 27.5 MCG/SPRAY nasal spray Place 2 sprays into the nose daily.    [provider]  folic acid (FOLVITE) 025 MCG tablet Take 1 tablet (800 mcg total) by mouth daily. 07/28/15   [name], MD  loratadine (CLARITIN) 10 MG tablet Take 1 tablet (10 mg total) by mouth daily. 04/30/16   [name], MD  mirtazapine (REMERON) 15 MG tablet Take 1 tablet (15 mg total) by mouth at bedtime. 03/20/18   Heilingoetter, Cassandra L, PA-C  Nutritional Supplements (JUICE PLUS FIBRE PO) Take 1 capsule by mouth daily. Reported on 07/28/2015    [provider]  omeprazole (PRILOSEC) 20 MG capsule Take 20 mg by mouth daily.    [provider]  osimertinib mesylate (TAGRISSO) 80 MG tablet Take 1 tablet (80 mg total) by mouth daily. 07/24/17   Maryanna Shape, NP  OVER THE COUNTER MEDICATION "Deep penetrating pain relief oil"    [provider]  prochlorperazine (COMPAZINE) 10 MG tablet Take 1 tablet (10 mg total) by mouth every 6 (six) hours as needed for nausea or vomiting. 04/09/17   [name], MD  ranitidine (ZANTAC) 300 MG tablet Take 1 tablet (300 mg total) by mouth at bedtime. 12/06/15   Mauri Pole, MD  TURMERIC PO Take 1 capsule by mouth 2 (two) times daily. Turmeric Superior 750 mg po bid    [provider]     Family History  Problem Relation Age of Onset  . Hyperlipidemia Mother     Social History   Socioeconomic History  . Marital status: Married    Spouse name: Not on file  . Number of children: 3  . Years of education: Not on file  . Highest  education level: Not on file  Occupational History  . Occupation: unemployed  Social Needs  . Financial resource strain: Not on file  . Food insecurity:    Worry: Not on file    Inability: Not on file  . Transportation needs:    Medical: Not on file    Non-medical: Not on file  Tobacco Use  . Smoking status: Never Smoker  . Smokeless tobacco: Never Used  Substance and Sexual Activity  . Alcohol use: No    Alcohol/week: 0.0 standard drinks  . Drug use: No  . Sexual activity: Not Currently  Lifestyle  . Physical activity:    Days per week: Not on file    Minutes per session: Not on file  . Stress: Not on file  Relationships  . Social connections:    Talks on phone: Not on file    Gets together: Not on file    Attends religious service: Not on file    Active member of  club or organization: Not on file    Attends meetings of clubs or organizations: Not on file    Relationship status: Not on file  Other Topics Concern  . Not on file  Social History Narrative   Patient moved to [place] in [place].   Had been in [place] since that time.      Review of Systems  She denies fever, headache, chest pain, worsening dyspnea, back pain, vomiting or abnormal bleeding.  She does have fatigue, diminished appetite, insomnia, occasional cough, left lower abdominal discomfort, nausea and anxiety.  Vital Signs: BP 111/72 (BP Location: Right Arm)   Pulse (!) 110   Temp 97.9 F (36.6 C) (Oral)   Resp 18   SpO2 99%   Physical Exam awake, alert.  Chest clear to auscultation bilaterally.  Heart with tachycardic but regular rhythm.  Abdomen soft, positive bowel sounds, some mild left lower quadrant tenderness to palpation.  No lower extremity edema.  Imaging: Ct Chest W Contrast  Result Date: 03/18/2018 CLINICAL DATA:  Non-small cell lung cancer. EXAM: CT CHEST, ABDOMEN, AND PELVIS WITH CONTRAST TECHNIQUE: Multidetector CT imaging of the chest, abdomen and pelvis was performed  following the standard protocol during bolus administration of intravenous contrast. CONTRAST:  143mL OMNIPAQUE IOHEXOL 300 MG/ML  SOLN COMPARISON:  11/25/2017 FINDINGS: CT CHEST FINDINGS Cardiovascular: The heart size appears within normal limits. No pericardial effusion identified. Increase caliber of the main pulmonary artery identified which may reflect PA hypertension. Mediastinum/Nodes: Normal appearance of the thyroid gland. The trachea appears patent and is midline. Normal appearance of the esophagus. Within the left supraclavicular region there is been interval development of multiple small lymph nodes which measure up to 6 mm short axis, image 2/2. Right paratracheal lymph node measures 1.1 cm, image 23/2. Previously 0.7 cm. Lungs/Pleura: No pleural effusion identified. Fibrosis with bronchiectasis, volume loss and masslike architectural distortion within the perihilar right mid lung and medial right lower lobe is again identified. The appearance is unchanged from previous exam. Musculoskeletal: Multifocal areas of abnormal sclerosis within the thoracic spine, ribs and sternum are again noted compatible with osseous metastatic disease. The appearance is unchanged from the previous exam. CT ABDOMEN PELVIS FINDINGS Hepatobiliary: A few small less than 1 cm liver hypodensities are again noted. The appearance is unchanged from 11/25/2017. No new focal liver abnormalities. Gallbladder appears normal. There is fusiform dilatation of the common bile duct which measures 1.5 cm in maximum diameter. Similar to previous exam. Mild intrahepatic duct dilatation is identified. Pancreas: Unremarkable. No pancreatic ductal dilatation or surrounding inflammatory changes. Spleen: Normal in size without focal abnormality. Adrenals/Urinary Tract: Normal right adrenal gland. Normal left adrenal gland. No kidney mass or hydronephrosis. The urinary bladder is normal. Stomach/Bowel: Stomach is within normal limits. Appendix  appears normal. No evidence of bowel wall thickening, distention, or inflammatory changes. Vascular/Lymphatic: Mild aortic atherosclerosis. Interval progression of retroperitoneal nodal metastases: -Left retroperitoneal nodal mass measures 3.2 by 3.1 cm and encases the left renal artery. On the previous exam this measured 1.0 x 1.6 cm. - new left retroperitoneal lymph node measures 1 cm, image 62/2. -aortocaval lymph node measures 1 cm, image 73/2.  New. -at the bifurcation there is a lymph node measuring 1 cm, image 80/2. Reproductive: Uterus and bilateral adnexa are unremarkable. Other: No free fluid or fluid collections. Musculoskeletal: Multifocal sclerotic bone metastases are again identified involving the bony pelvis, proximal femurs and lumbar spine. The appearance is unchanged from previous exam. IMPRESSION: 1. Interval progression  of retroperitoneal adenopathy. 2. Multiple new subcentimeter left supraclavicular lymph nodes and increase size of right paratracheal lymph node. Suspicious for nodal metastases. 3. Stable sclerotic bone metastases. 4. Stable post therapeutic appearance of the right lung without findings to suggest local tumor recurrence. Electronically Signed   By: Kerby Moors M.D.   On: 03/18/2018 10:56   Ct Abdomen Pelvis W Contrast  Result Date: 03/18/2018 CLINICAL DATA:  Non-small cell lung cancer. EXAM: CT CHEST, ABDOMEN, AND PELVIS WITH CONTRAST TECHNIQUE: Multidetector CT imaging of the chest, abdomen and pelvis was performed following the standard protocol during bolus administration of intravenous contrast. CONTRAST:  169mL OMNIPAQUE IOHEXOL 300 MG/ML  SOLN COMPARISON:  11/25/2017 FINDINGS: CT CHEST FINDINGS Cardiovascular: The heart size appears within normal limits. No pericardial effusion identified. Increase caliber of the main pulmonary artery identified which may reflect PA hypertension. Mediastinum/Nodes: Normal appearance of the thyroid gland. The trachea appears patent and  is midline. Normal appearance of the esophagus. Within the left supraclavicular region there is been interval development of multiple small lymph nodes which measure up to 6 mm short axis, image 2/2. Right paratracheal lymph node measures 1.1 cm, image 23/2. Previously 0.7 cm. Lungs/Pleura: No pleural effusion identified. Fibrosis with bronchiectasis, volume loss and masslike architectural distortion within the perihilar right mid lung and medial right lower lobe is again identified. The appearance is unchanged from previous exam. Musculoskeletal: Multifocal areas of abnormal sclerosis within the thoracic spine, ribs and sternum are again noted compatible with osseous metastatic disease. The appearance is unchanged from the previous exam. CT ABDOMEN PELVIS FINDINGS Hepatobiliary: A few small less than 1 cm liver hypodensities are again noted. The appearance is unchanged from 11/25/2017. No new focal liver abnormalities. Gallbladder appears normal. There is fusiform dilatation of the common bile duct which measures 1.5 cm in maximum diameter. Similar to previous exam. Mild intrahepatic duct dilatation is identified. Pancreas: Unremarkable. No pancreatic ductal dilatation or surrounding inflammatory changes. Spleen: Normal in size without focal abnormality. Adrenals/Urinary Tract: Normal right adrenal gland. Normal left adrenal gland. No kidney mass or hydronephrosis. The urinary bladder is normal. Stomach/Bowel: Stomach is within normal limits. Appendix appears normal. No evidence of bowel wall thickening, distention, or inflammatory changes. Vascular/Lymphatic: Mild aortic atherosclerosis. Interval progression of retroperitoneal nodal metastases: -Left retroperitoneal nodal mass measures 3.2 by 3.1 cm and encases the left renal artery. On the previous exam this measured 1.0 x 1.6 cm. - new left retroperitoneal lymph node measures 1 cm, image 62/2. -aortocaval lymph node measures 1 cm, image 73/2.  New. -at the  bifurcation there is a lymph node measuring 1 cm, image 80/2. Reproductive: Uterus and bilateral adnexa are unremarkable. Other: No free fluid or fluid collections. Musculoskeletal: Multifocal sclerotic bone metastases are again identified involving the bony pelvis, proximal femurs and lumbar spine. The appearance is unchanged from previous exam. IMPRESSION: 1. Interval progression of retroperitoneal adenopathy. 2. Multiple new subcentimeter left supraclavicular lymph nodes and increase size of right paratracheal lymph node. Suspicious for nodal metastases. 3. Stable sclerotic bone metastases. 4. Stable post therapeutic appearance of the right lung without findings to suggest local tumor recurrence. Electronically Signed   By: Kerby Moors M.D.   On: 03/18/2018 10:56    Labs:  CBC: Recent Labs    11/25/17 0758 01/07/18 0738 02/19/18 0813 03/18/18 0737  WBC 4.7 5.1 4.7 5.1  HGB 13.3 13.8 13.8 13.8  HCT 40.9 42.7 41.1 42.4  PLT 261 268 268 272    COAGS: No results  for input(s): INR, APTT in the last 8760 hours.  BMP: Recent Labs    11/25/17 0758 01/07/18 0738 02/19/18 0813 03/18/18 0737  NA 141 137 140 142  K 4.4 3.9 4.3 4.4  CL 106 104 103 102  CO2 26 23 28 27   GLUCOSE 80 91 79 84  BUN 9 11 13 11   CALCIUM 9.2 8.7* 9.1 8.6*  CREATININE 0.73 0.74 0.76 0.78  GFRNONAA >60 >60 >60 >60  GFRAA >60 >60 >60 >60    LIVER FUNCTION TESTS: Recent Labs    11/25/17 0758 01/07/18 0738 02/19/18 0813 03/18/18 0737  BILITOT 0.5 0.4 0.5 0.4  AST 21 19 22 22   ALT 23 24 23 23   ALKPHOS 46 49 53 67  PROT 6.9 6.9 6.8 7.5  ALBUMIN 3.7 3.7 3.8 3.9    TUMOR MARKERS: No results for input(s): AFPTM, CEA, CA199, CHROMGRNA in the last 8760 hours.  Assessment and Plan: 48 y.o. female familiar to IR service from prior right bronchial artery embolization in 2016 with repeat in 2017 and left iliac bone soft tissue mass biopsy in 2016. She has a history of stage IV adenocarcinoma of the right  lung with recent follow-up CT revealing: 1. Interval progression of retroperitoneal adenopathy. 2. Multiple new subcentimeter left supraclavicular lymph nodes and increase size of right paratracheal lymph node. Suspicious for nodal metastases. 3. Stable sclerotic bone metastases. 4. Stable post therapeutic appearance of the right lung without findings to suggest local tumor recurrence  She presents today for image guided retroperitoneal lymph node biopsy for further evaluation.Risks and benefits of procedure  was discussed with the patient via interpreter including, but not limited to bleeding, infection, damage to adjacent structures or low yield requiring additional tests.  All of the questions were answered and there is agreement to proceed.  Consent signed and in chart.  LABS PENDING   Thank you for this interesting consult.  I greatly enjoyed meeting Myha T Boise and look forward to participating in their care.  A copy of this report was sent to the requesting provider on this date.  Electronically Signed: D. Rowe Robert, PA-C 03/31/2018, 9:59 AM  I spent a total of  25 minutes   in face to face in clinical consultation, greater than 50% of which was counseling/coordinating care for image guided retroperitoneal lymph node biopsy

## 2018-03-31 NOTE — Discharge Instructions (Signed)
Needle Biopsy, Care After °These instructions tell you how to care for yourself after your procedure. Your doctor may also give you more specific instructions. Call your doctor if you have any problems or questions. °What can I expect after the procedure? °After the procedure, it is common to have: °· Soreness. °· Bruising. °· Mild pain. °Follow these instructions at home: ° °· Return to your normal activities as told by your doctor. Ask your doctor what activities are safe for you. °· Take over-the-counter and prescription medicines only as told by your doctor. °· Wash your hands with soap and water before you change your bandage (dressing). If you cannot use soap and water, use hand sanitizer. °· Follow instructions from your doctor about: °? How to take care of your puncture site. °? When and how to change your bandage. °? When to remove your bandage. °· Check your puncture site every day for signs of infection. Watch for: °? Redness, swelling, or pain. °? Fluid or blood.  °? Pus or a bad smell. °? Warmth. °· Do not take baths, swim, or use a hot tub until your doctor approves. Ask your doctor if you may take showers. You may only be allowed to take sponge baths. °· Keep all follow-up visits as told by your doctor. This is important. °Contact a doctor if you have: °· A fever. °· Redness, swelling, or pain at the puncture site, and it lasts longer than a few days. °· Fluid, blood, or pus coming from the puncture site. °· Warmth coming from the puncture site. °Get help right away if: °· You have a lot of bleeding from the puncture site. °Summary °· After the procedure, it is common to have soreness, bruising, or mild pain at the puncture site. °· Check your puncture site every day for signs of infection, such as redness, swelling, or pain. °· Get help right away if you have severe bleeding from your puncture site. °This information is not intended to replace advice given to you by your health care provider. Make  sure you discuss any questions you have with your health care provider. °Document Released: 12/15/2007 Document Revised: 01/14/2017 Document Reviewed: 01/14/2017 °Elsevier Interactive Patient Education © 2019 [employer] ° °

## 2018-03-31 NOTE — Procedures (Signed)
Pre Procedure Dx: Cervical lymphadenopathy Post Procedural Dx: Same  Technically successful US guided biopsy of indeterminate left supraclavicular lymphadenopathy.   EBL: None  No immediate complications.   Ronny Bacon, MD Pager #: [phone]

## 2018-04-01 ENCOUNTER — Other Ambulatory Visit: Payer: Self-pay | Admitting: Physician Assistant

## 2018-04-01 ENCOUNTER — Other Ambulatory Visit: Payer: Self-pay

## 2018-04-01 ENCOUNTER — Other Ambulatory Visit: Payer: Self-pay | Admitting: *Deleted

## 2018-04-01 ENCOUNTER — Ambulatory Visit (HOSPITAL_COMMUNITY)
Admission: RE | Admit: 2018-04-01 | Discharge: 2018-04-01 | Disposition: A | Discharge status: Home / Self Care | Payer: Medicare Other | Source: Ambulatory Visit | Attending: Internal Medicine | Admitting: Internal Medicine

## 2018-04-01 ENCOUNTER — Other Ambulatory Visit: Payer: Self-pay | Admitting: Medical

## 2018-04-01 ENCOUNTER — Inpatient Hospital Stay (HOSPITAL_BASED_OUTPATIENT_CLINIC_OR_DEPARTMENT_OTHER): Payer: Medicare Other | Admitting: Internal Medicine

## 2018-04-01 ENCOUNTER — Telehealth: Payer: Self-pay | Admitting: *Deleted

## 2018-04-01 ENCOUNTER — Encounter: Payer: Self-pay | Admitting: Internal Medicine

## 2018-04-01 ENCOUNTER — Encounter: Payer: Self-pay | Admitting: Physician Assistant

## 2018-04-01 ENCOUNTER — Inpatient Hospital Stay: Payer: Medicare Other

## 2018-04-01 ENCOUNTER — Telehealth: Payer: Self-pay | Admitting: Internal Medicine

## 2018-04-01 VITALS — BP 98/68 | HR 116 | Temp 98.2°F | Resp 16 | Ht 59.0 in | Wt 105.5 lb

## 2018-04-01 DIAGNOSIS — C3491 Malignant neoplasm of unspecified part of right bronchus or lung: Secondary | ICD-10-CM | POA: Diagnosis present

## 2018-04-01 DIAGNOSIS — C7951 Secondary malignant neoplasm of bone: Secondary | ICD-10-CM | POA: Diagnosis not present

## 2018-04-01 DIAGNOSIS — C7931 Secondary malignant neoplasm of brain: Secondary | ICD-10-CM

## 2018-04-01 DIAGNOSIS — C3431 Malignant neoplasm of lower lobe, right bronchus or lung: Secondary | ICD-10-CM

## 2018-04-01 DIAGNOSIS — C342 Malignant neoplasm of middle lobe, bronchus or lung: Secondary | ICD-10-CM | POA: Diagnosis not present

## 2018-04-01 DIAGNOSIS — M79662 Pain in left lower leg: Secondary | ICD-10-CM

## 2018-04-01 DIAGNOSIS — F5102 Adjustment insomnia: Secondary | ICD-10-CM

## 2018-04-01 DIAGNOSIS — R110 Nausea: Secondary | ICD-10-CM

## 2018-04-01 DIAGNOSIS — Z79899 Other long term (current) drug therapy: Secondary | ICD-10-CM

## 2018-04-01 DIAGNOSIS — Z5111 Encounter for antineoplastic chemotherapy: Secondary | ICD-10-CM

## 2018-04-01 DIAGNOSIS — R630 Anorexia: Secondary | ICD-10-CM

## 2018-04-01 DIAGNOSIS — R5383 Other fatigue: Secondary | ICD-10-CM

## 2018-04-01 DIAGNOSIS — E889 Metabolic disorder, unspecified: Secondary | ICD-10-CM

## 2018-04-01 DIAGNOSIS — R590 Localized enlarged lymph nodes: Secondary | ICD-10-CM

## 2018-04-01 DIAGNOSIS — C7972 Secondary malignant neoplasm of left adrenal gland: Secondary | ICD-10-CM | POA: Diagnosis not present

## 2018-04-01 DIAGNOSIS — M9080 Osteopathy in diseases classified elsewhere, unspecified site: Secondary | ICD-10-CM

## 2018-04-01 DIAGNOSIS — M898X9 Other specified disorders of bone, unspecified site: Secondary | ICD-10-CM

## 2018-04-01 DIAGNOSIS — L298 Other pruritus: Secondary | ICD-10-CM

## 2018-04-01 DIAGNOSIS — Z923 Personal history of irradiation: Secondary | ICD-10-CM

## 2018-04-01 MED ORDER — DENOSUMAB 120 MG/1.7ML ~~LOC~~ SOLN
SUBCUTANEOUS | Status: AC
  Filled 2018-04-01: qty 1.7

## 2018-04-01 MED ORDER — RIVAROXABAN (XARELTO) VTE STARTER PACK (15 & 20 MG): ORAL_TABLET | ORAL | 0 refills | Status: DC

## 2018-04-01 MED ORDER — DENOSUMAB 120 MG/1.7ML ~~LOC~~ SOLN
120.0000 mg | Freq: Once | SUBCUTANEOUS | Status: AC
  Administered 2018-04-01: 120 mg via SUBCUTANEOUS

## 2018-04-01 MED FILL — XARELTO STARTER PACK: 15 & 20 | 30 days supply | Qty: 51 | Fill #0

## 2018-04-01 NOTE — Progress Notes (Signed)
[place] [phone]   [phone]  OFFICE PROGRESS NOTE  [name], MD [place] [address]  DIAGNOSIS: Stage IV (T2a, N1, M1b) non-small cell lung cancer, adenocarcinoma with positive EGFR mutation (deletion 84) presented with right middle lobe lung mass, right hilar adenopathy as well as metastatic disease to the bone, brain and left adrenal diagnosed in July 2016.  PRIOR THERAPY: 1) palliative radiotherapy to the brain as well as metastatic bone lesions in the pelvis. 2) Tarceva 150 mg by mouth daily started on 09/25/2014. Status post 5 months of treatment. 3) status post palliative radiotherapy to the right middle lobe lung mass for treatment of hemoptysis. 4) Tarceva 100 mg by mouth daily started 02/19/2015 status post 13 months of treatment. This was discontinued in May 2018 after the patient had disease progression and development of T790M resistant mutation.  5) palliative stereotactic radiotherapy to the left adrenal gland metastatic lesion under the care of Dr. Lisbeth Renshaw completed on April 10, 2017.  CURRENT THERAPY: 1) Tagrisso 80 mg by mouth daily started 06/02/2016.  Status post 23 months of treatment. 2) Xgeva 120 g subcutaneously every 6 weeks.  INTERVAL HISTORY: Deborah Foster 48 y.o. female returns to the clinic today for follow-up visit.  The patient is feeling fine today with no concerning complaints except for fatigue as well as insomnia.  She also has pain in the left calf started several days ago.  She denied having any current fever or chills.  She has no nausea, vomiting, diarrhea or constipation.  She has no chest pain, shortness of breath but continues to have dry cough with no hemoptysis.  She has been tolerating her treatment with Tagrisso fairly well.  She underwent ultrasound-guided core biopsy of left supraclavicular lymph node yesterday.  The final pathology is still pending.  The patient is  here today for evaluation and recommendation regarding her condition.  MEDICAL HISTORY: Past Medical History:  Diagnosis Date   Bone metastasis ([place])    Hemoptysis    Hypokalemia    lung ca dx'd 07/2014   Lung mass    Metastasis to adrenal gland ([facility])    Metastasis to brain [facility])    Pneumonia    Radiation 08/23/14-09/07/14   Brain/chest and left hip 30 Gy 12 Fx   URI (upper respiratory infection) 02-02-2015    ALLERGIES:  is allergic to other and doxycycline.  MEDICATIONS:  Current Outpatient Medications  Medication Sig Dispense Refill   AMBULATORY NON FORMULARY MEDICATION Medication Name: MAGIC MOUTHWASH 2 % Viscous Lidocaine  Maalox Benadryl Susp.  Disp: 1:1:1 276m bottle Sig: 120mPO Swish & Spit  q 3-4hrs. PRN mouth sores (Patient not taking: Reported on 07/29/2017) 200 mL 3   benzonatate (TESSALON) 200 MG capsule TK ONE C PO Q 8 H PRN COU  0   Calcium Carbonate-Vitamin D (CALCIUM 500 + D) 500-125 MG-UNIT TABS Take 500 mg by mouth 2 (two) times daily.     chlorpheniramine-HYDROcodone (TUSSIONEX) 10-8 MG/5ML SUER Take 5 mLs by mouth every 12 (twelve) hours as needed. 140 mL 0   clindamycin (CLINDAGEL) 1 % gel Apply 1 application topically 2 (two) times daily.     clobetasol cream (TEMOVATE) 0.4.28 Apply 1 application topically 2 (two) times daily.     Cyanocobalamin (VITAMIN B-12) 2500 MCG SUBL Place 2,500 mcg under the tongue daily.     Dextromethorphan Polistirex (DELSYM PO) Take by mouth.  ENSURE (ENSURE) Take 1 Can by mouth 2 (two) times daily between meals.      ferrous sulfate 325 (65 FE) MG EC tablet Take 325 mg by mouth daily with breakfast.     fluticasone (VERAMYST) 27.5 MCG/SPRAY nasal spray Place 2 sprays into the nose daily.     folic acid (FOLVITE) 322 MCG tablet Take 1 tablet (800 mcg total) by mouth daily. 30 tablet 1   loratadine (CLARITIN) 10 MG tablet Take 1 tablet (10 mg total) by mouth daily. 30 tablet 0   mirtazapine (REMERON)  15 MG tablet Take 1 tablet (15 mg total) by mouth at bedtime. 30 tablet 0   Nutritional Supplements (JUICE PLUS FIBRE PO) Take 1 capsule by mouth daily. Reported on 07/28/2015     omeprazole (PRILOSEC) 20 MG capsule Take 20 mg by mouth daily.     osimertinib mesylate (TAGRISSO) 80 MG tablet Take 1 tablet (80 mg total) by mouth daily. 30 tablet 2   OVER THE COUNTER MEDICATION "Deep penetrating pain relief oil"     prochlorperazine (COMPAZINE) 10 MG tablet Take 1 tablet (10 mg total) by mouth every 6 (six) hours as needed for nausea or vomiting. 30 tablet 0   ranitidine (ZANTAC) 300 MG tablet Take 1 tablet (300 mg total) by mouth at bedtime. 30 tablet 6   TURMERIC PO Take 1 capsule by mouth 2 (two) times daily. Turmeric Superior 750 mg po bid     No current facility-administered medications for this visit.     SURGICAL HISTORY:  Past Surgical History:  Procedure Laterality Date   IR GENERIC HISTORICAL  07/14/2015   IR RADIOLOGIST EVAL & MGMT 07/14/2015 Greggory Keen, MD [facility] INTERV RAD   VIDEO BRONCHOSCOPY Bilateral 08/09/2014   Procedure: VIDEO BRONCHOSCOPY WITHOUT FLUORO;  Surgeon: [name], MD;  Location: [facility];  Service: Cardiopulmonary;  Laterality: Bilateral;   VIDEO BRONCHOSCOPY Bilateral 07/06/2015   Procedure: VIDEO BRONCHOSCOPY WITHOUT FLUORO;  Surgeon: [name], MD;  Location: [facility];  Service: Cardiopulmonary;  Laterality: Bilateral;    REVIEW OF SYSTEMS:  Constitutional: positive for fatigue Eyes: negative Ears, nose, mouth, throat, and face: negative Respiratory: positive for cough Cardiovascular: negative Gastrointestinal: negative Genitourinary:negative Integument/breast: negative Hematologic/lymphatic: negative Musculoskeletal:negative Neurological: negative Behavioral/Psych: negative Endocrine: negative Allergic/Immunologic: negative   PHYSICAL EXAMINATION: General appearance: alert, cooperative, fatigued and no  distress Head: Normocephalic, without obvious abnormality, atraumatic Neck: no adenopathy, no JVD, supple, symmetrical, trachea midline and thyroid not enlarged, symmetric, no tenderness/mass/nodules Lymph nodes: Cervical, supraclavicular, and axillary nodes normal. Resp: clear to auscultation bilaterally Back: symmetric, no curvature. ROM normal. No CVA tenderness. Cardio: regular rate and rhythm, S1, S2 normal, no murmur, click, rub or gallop GI: soft, non-tender; bowel sounds normal; no masses,  no organomegaly Extremities: extremities normal, atraumatic, no cyanosis or edema Neurologic: Alert and oriented X 3, normal strength and tone. Normal symmetric reflexes. Normal coordination and gait  ECOG PERFORMANCE STATUS: 1 - Symptomatic but completely ambulatory  Blood pressure 98/68, pulse (!) 116, temperature 98.2 F (36.8 C), temperature source Oral, resp. rate 16, height '4\' 11"'$  (1.499 m), weight 105 lb 8 oz (47.9 kg), SpO2 98 %.  LABORATORY DATA: Lab Results  Component Value Date   WBC 5.5 03/31/2018   HGB 13.8 03/31/2018   HCT 42.3 03/31/2018   MCV 97.2 03/31/2018   PLT 234 03/31/2018      Chemistry      Component Value Date/Time   NA 139 03/31/2018 0952   NA  140 01/17/2017 1003   K 4.1 03/31/2018 0952   K 3.9 01/17/2017 1003   CL 104 03/31/2018 0952   CO2 25 03/31/2018 0952   CO2 29 01/17/2017 1003   BUN 11 03/31/2018 0952   BUN 14.8 01/17/2017 1003   CREATININE 0.73 03/31/2018 0952   CREATININE 0.78 03/18/2018 0737   CREATININE 0.8 01/17/2017 1003      Component Value Date/Time   CALCIUM 8.9 03/31/2018 0952   CALCIUM 8.7 01/17/2017 1003   ALKPHOS 58 03/31/2018 0952   ALKPHOS 50 01/17/2017 1003   AST 25 03/31/2018 0952   AST 22 03/18/2018 0737   AST 23 01/17/2017 1003   ALT 27 03/31/2018 0952   ALT 23 03/18/2018 0737   ALT 27 01/17/2017 1003   BILITOT 0.5 03/31/2018 0952   BILITOT 0.4 03/18/2018 0737   BILITOT 0.46 01/17/2017 1003       RADIOGRAPHIC  STUDIES: Ct Chest W Contrast  Result Date: 03/18/2018 CLINICAL DATA:  Non-small cell lung cancer. EXAM: CT CHEST, ABDOMEN, AND PELVIS WITH CONTRAST TECHNIQUE: Multidetector CT imaging of the chest, abdomen and pelvis was performed following the standard protocol during bolus administration of intravenous contrast. CONTRAST:  129m OMNIPAQUE IOHEXOL 300 MG/ML  SOLN COMPARISON:  11/25/2017 FINDINGS: CT CHEST FINDINGS Cardiovascular: The heart size appears within normal limits. No pericardial effusion identified. Increase caliber of the main pulmonary artery identified which may reflect PA hypertension. Mediastinum/Nodes: Normal appearance of the thyroid gland. The trachea appears patent and is midline. Normal appearance of the esophagus. Within the left supraclavicular region there is been interval development of multiple small lymph nodes which measure up to 6 mm short axis, image 2/2. Right paratracheal lymph node measures 1.1 cm, image 23/2. Previously 0.7 cm. Lungs/Pleura: No pleural effusion identified. Fibrosis with bronchiectasis, volume loss and masslike architectural distortion within the perihilar right mid lung and medial right lower lobe is again identified. The appearance is unchanged from previous exam. Musculoskeletal: Multifocal areas of abnormal sclerosis within the thoracic spine, ribs and sternum are again noted compatible with osseous metastatic disease. The appearance is unchanged from the previous exam. CT ABDOMEN PELVIS FINDINGS Hepatobiliary: A few small less than 1 cm liver hypodensities are again noted. The appearance is unchanged from 11/25/2017. No new focal liver abnormalities. Gallbladder appears normal. There is fusiform dilatation of the common bile duct which measures 1.5 cm in maximum diameter. Similar to previous exam. Mild intrahepatic duct dilatation is identified. Pancreas: Unremarkable. No pancreatic ductal dilatation or surrounding inflammatory changes. Spleen: Normal in size  without focal abnormality. Adrenals/Urinary Tract: Normal right adrenal gland. Normal left adrenal gland. No kidney mass or hydronephrosis. The urinary bladder is normal. Stomach/Bowel: Stomach is within normal limits. Appendix appears normal. No evidence of bowel wall thickening, distention, or inflammatory changes. Vascular/Lymphatic: Mild aortic atherosclerosis. Interval progression of retroperitoneal nodal metastases: -Left retroperitoneal nodal mass measures 3.2 by 3.1 cm and encases the left renal artery. On the previous exam this measured 1.0 x 1.6 cm. - new left retroperitoneal lymph node measures 1 cm, image 62/2. -aortocaval lymph node measures 1 cm, image 73/2.  New. -at the bifurcation there is a lymph node measuring 1 cm, image 80/2. Reproductive: Uterus and bilateral adnexa are unremarkable. Other: No free fluid or fluid collections. Musculoskeletal: Multifocal sclerotic bone metastases are again identified involving the bony pelvis, proximal femurs and lumbar spine. The appearance is unchanged from previous exam. IMPRESSION: 1. Interval progression of retroperitoneal adenopathy. 2. Multiple new subcentimeter left supraclavicular lymph nodes  and increase size of right paratracheal lymph node. Suspicious for nodal metastases. 3. Stable sclerotic bone metastases. 4. Stable post therapeutic appearance of the right lung without findings to suggest local tumor recurrence. Electronically Signed   By: Kerby Moors M.D.   On: 03/18/2018 10:56   Ct Abdomen Pelvis W Contrast  Result Date: 03/18/2018 CLINICAL DATA:  Non-small cell lung cancer. EXAM: CT CHEST, ABDOMEN, AND PELVIS WITH CONTRAST TECHNIQUE: Multidetector CT imaging of the chest, abdomen and pelvis was performed following the standard protocol during bolus administration of intravenous contrast. CONTRAST:  166m OMNIPAQUE IOHEXOL 300 MG/ML  SOLN COMPARISON:  11/25/2017 FINDINGS: CT CHEST FINDINGS Cardiovascular: The heart size appears within  normal limits. No pericardial effusion identified. Increase caliber of the main pulmonary artery identified which may reflect PA hypertension. Mediastinum/Nodes: Normal appearance of the thyroid gland. The trachea appears patent and is midline. Normal appearance of the esophagus. Within the left supraclavicular region there is been interval development of multiple small lymph nodes which measure up to 6 mm short axis, image 2/2. Right paratracheal lymph node measures 1.1 cm, image 23/2. Previously 0.7 cm. Lungs/Pleura: No pleural effusion identified. Fibrosis with bronchiectasis, volume loss and masslike architectural distortion within the perihilar right mid lung and medial right lower lobe is again identified. The appearance is unchanged from previous exam. Musculoskeletal: Multifocal areas of abnormal sclerosis within the thoracic spine, ribs and sternum are again noted compatible with osseous metastatic disease. The appearance is unchanged from the previous exam. CT ABDOMEN PELVIS FINDINGS Hepatobiliary: A few small less than 1 cm liver hypodensities are again noted. The appearance is unchanged from 11/25/2017. No new focal liver abnormalities. Gallbladder appears normal. There is fusiform dilatation of the common bile duct which measures 1.5 cm in maximum diameter. Similar to previous exam. Mild intrahepatic duct dilatation is identified. Pancreas: Unremarkable. No pancreatic ductal dilatation or surrounding inflammatory changes. Spleen: Normal in size without focal abnormality. Adrenals/Urinary Tract: Normal right adrenal gland. Normal left adrenal gland. No kidney mass or hydronephrosis. The urinary bladder is normal. Stomach/Bowel: Stomach is within normal limits. Appendix appears normal. No evidence of bowel wall thickening, distention, or inflammatory changes. Vascular/Lymphatic: Mild aortic atherosclerosis. Interval progression of retroperitoneal nodal metastases: -Left retroperitoneal nodal mass measures  3.2 by 3.1 cm and encases the left renal artery. On the previous exam this measured 1.0 x 1.6 cm. - new left retroperitoneal lymph node measures 1 cm, image 62/2. -aortocaval lymph node measures 1 cm, image 73/2.  New. -at the bifurcation there is a lymph node measuring 1 cm, image 80/2. Reproductive: Uterus and bilateral adnexa are unremarkable. Other: No free fluid or fluid collections. Musculoskeletal: Multifocal sclerotic bone metastases are again identified involving the bony pelvis, proximal femurs and lumbar spine. The appearance is unchanged from previous exam. IMPRESSION: 1. Interval progression of retroperitoneal adenopathy. 2. Multiple new subcentimeter left supraclavicular lymph nodes and increase size of right paratracheal lymph node. Suspicious for nodal metastases. 3. Stable sclerotic bone metastases. 4. Stable post therapeutic appearance of the right lung without findings to suggest local tumor recurrence. Electronically Signed   By: TKerby MoorsM.D.   On: 03/18/2018 10:56   [place] Biopsy (lymph Nodes)  Result Date: 03/31/2018 INDICATION: History of stage IV non-small cell cancer of the right lung, now with recent CT scan of the chest, abdomen pelvis demonstrating progressive retroperitoneal and left supraclavicular lymphadenopathy. Sonographic evaluation the left supraclavicular fossa was performed in the short-stay unit prior to the procedure and demonstrated adequate visualization  of left supraclavicular lymph node. As such, decision was made to proceed with ultrasound-guided left supraclavicular lymph node biopsy as the dominant retroperitoneal nodal conglomeration encases the left renal artery and abuts the left renal vein. The possibility that the left subclavicular lymph node could be just reactive in etiology and patient ultimately may have to undergo CT-guided retroperitoneal lymph biopsy was discussed in detail with the patient however following this prolonged detailed conversation  the patient agrees to proceed with ultrasound-guided left supraclavicular biopsy for tissue diagnostic purposes. EXAM: ULTRASOUND-GUIDED LEFT SUPRACLAVICULAR LYMPH NODE BIOPSY COMPARISON:  CT scan of the chest, abdomen and pelvis - 03/18/2018 MEDICATIONS: None ANESTHESIA/SEDATION: None, per patient request COMPLICATIONS: None immediate. TECHNIQUE: Informed written consent was obtained from the patient after a discussion of the risks, benefits and alternatives to treatment. Questions regarding the procedure were encouraged and answered. Initial ultrasound scanning demonstrated an approximately 0.6 x 0.7 cm hypoechoic left supraclavicular lymph node correlating with the left supraclavicular lymph node seen on preceding chest CT image 2, series 2. An ultrasound image was saved for documentation purposes. The procedure was planned. A timeout was performed prior to the initiation of the procedure. The operative was prepped and draped in the usual sterile fashion, and a sterile drape was applied covering the operative field. A timeout was performed prior to the initiation of the procedure. Local anesthesia was provided with 1% lidocaine with epinephrine. Under direct ultrasound guidance, an 18 gauge core needle device was utilized to obtain to obtain 5 core needle biopsies of the indeterminate left supraclavicular lymph node. The samples were placed in saline and submitted to pathology. The needle was removed and hemostasis was achieved with manual compression. Post procedure scan was negative for significant hematoma. A dressing was placed. The patient tolerated the procedure well without immediate postprocedural complication. IMPRESSION: Technically successful ultrasound guided biopsy of indeterminate left supraclavicular lymph node. Electronically Signed   By: Sandi Mariscal M.D.   On: 03/31/2018 13:57    ASSESSMENT AND PLAN:  This is a very pleasant 48 years old Asian female with stage IV non-small cell lung cancer,  adenocarcinoma with positive EGFR mutation with deletion in exon 67 diagnosed in July 2016 status post treatment with Tarceva for a total of 18 months but this was discontinued secondary to disease progression and development of T790M resistant mutation. The patient was started on treatment with Tagrisso 80 mg by mouth daily status post 23 months of treatment. The patient continues to tolerate her treatment with Tagrisso fairly well. I recommended for her to stay on the same treatment with no change for now. For the progressive disease in the retroperitoneal lymph node as well as left supraclavicular lymphadenopathy, I will refer the patient to radiation oncology for consideration of palliative radiotherapy. I will also send the tissue block from the biopsy performed yesterday to [facility] for molecular studies and to rule out the development of any new resistant mutation. For the pain and the left calf, I will order Doppler of the left lower extremity to rule out DVT. For the metastatic bone disease, she will continue her current treatment with Xgeva every 6 weeks.  I will see the patient back for follow-up visit in 1 months for reevaluation and repeat blood work as well as discussion of the molecular studies and new treatment options if needed. The patient was advised to call immediately if she has any concerning symptoms in the interval. The patient voices understanding of current disease status and treatment options and  is in agreement with the current care plan. All questions were answered. The patient knows to call the clinic with any problems, questions or concerns. We can certainly see the patient much sooner if necessary.  Disclaimer: This note was dictated with voice recognition software. Similar sounding words can inadvertently be transcribed and may not be corrected upon review.

## 2018-04-01 NOTE — Patient Instructions (Signed)

## 2018-04-01 NOTE — Progress Notes (Unsigned)
The patient had a venous duplex ultrasound today which demonstrated a DVT in the left posterior tibial vein.   Spoke to patient and her interpretor regarding her prescription for the Xeralto starter pack. The patient was instructed to take 15 mg twice daily with food for 21 days followed by 20 mg once daily with food. The patient was advised to call our office when she requires a refill so we can prescribe the 20 mg once daily dose. She expresses understanding. All questions were answered.

## 2018-04-01 NOTE — Telephone Encounter (Signed)
"  [place], vascular lab [phone]).  Calling today's STAT left lower extremity venous study.   Positive for acute DVT of one of the paired post tibial veins mid-calf to below the knee.  Need orders instructions for Deborah Foster here with an interpreter."  Report information conveyed to [place] PA-C.  Vascular lab advised for patient and interpreter to return to [facility] registration.  Further orders or instructions per provider

## 2018-04-01 NOTE — Telephone Encounter (Signed)
Scheduled appt per 3/17 los.  Printed calendar and avs.

## 2018-04-01 NOTE — Progress Notes (Signed)
Left lower extremity venous duplex completed. Preliminary results in Chart review CV Proc. [place] Jeani Fassnacht,RVS 04/01/2018,1:21 PM

## 2018-04-02 ENCOUNTER — Telehealth: Payer: Self-pay | Admitting: Internal Medicine

## 2018-04-02 NOTE — Telephone Encounter (Signed)
Scheduled appt per 3/17 los.  Printed calendar and avs.

## 2018-04-03 ENCOUNTER — Telehealth: Payer: Self-pay | Admitting: *Deleted

## 2018-04-03 ENCOUNTER — Ambulatory Visit: Payer: Medicare Other | Admitting: Radiation Oncology

## 2018-04-03 ENCOUNTER — Ambulatory Visit: Payer: Medicare Other

## 2018-04-03 DIAGNOSIS — C3491 Malignant neoplasm of unspecified part of right bronchus or lung: Secondary | ICD-10-CM

## 2018-04-03 NOTE — Telephone Encounter (Signed)
Oncology Nurse Navigator Documentation  Oncology Nurse Navigator Flowsheets 04/03/2018  Navigator Location [facility]  Navigator Encounter Type Telephone;Other  Telephone Outgoing Call/I called Ms. Pawelski today per Dr. Worthy Flank request.  He would like her to get Guardant blood test. I called to update her and scheduled for 10:30 on 3/24 after she sees Rad Onc. She verbalized understanding   Patient Visit Type -  Treatment Phase -  Barriers/Navigation Needs Education;Coordination of Care  Education Other  Interventions Coordination of Care;Education  Coordination of Care Other  Education Method Verbal  Acuity Level 2  Acuity Level 2 -  Time Spent with Patient 30

## 2018-04-04 MED FILL — TAGRISSO 80 MG TABLET: 80 | 30 days supply | Qty: 30 | Fill #10

## 2018-04-04 NOTE — Progress Notes (Signed)
Thoracic Location of Tumor / Histology: lung   Biopsies of 03/31/18 Diagnosis Lymph node, needle/core biopsy, left supraclavicular - METASTATIC LUNG ADENOCARCINOMA. SEE NOTE. - ONLY SCANT AMOUNT OF METASTATIC CARCINOMA IS PRESENT  Tobacco/Marijuana/Snuff/ETOH use:   Past/Anticipated interventions by cardiothoracic surgery, if any:   Past/Anticipated interventions by medical oncology, if any:   Signs/Symptoms  Weight changes, if any: None  Respiratory complaints, if any: None  Hemoptysis, if any: None  Pain issues, if any:   None   SAFETY ISSUES:  Prior radiation? Yes  Pacemaker/ICD?  No  Possible current pregnancy? No  Is the patient on methotrexate?    NO Current Complaints / other details:   Vitals:   04/08/18 0938  BP: 99/65  Pulse: (!) 105  Resp: 18  Temp: (!) 97.3 F (36.3 C)  TempSrc: Oral  SpO2: 100%  Weight: 106 lb 6 oz (48.3 kg)   Wt Readings from Last 3 Encounters:  04/08/18 106 lb 6 oz (48.3 kg)  04/01/18 105 lb 8 oz (47.9 kg)  03/20/18 106 lb 3.2 oz (48.2 kg)

## 2018-04-08 ENCOUNTER — Ambulatory Visit
Admission: RE | Admit: 2018-04-08 | Discharge: 2018-04-08 | Disposition: A | Discharge status: Home / Self Care | Payer: Medicare Other | Source: Ambulatory Visit | Attending: Radiation Oncology | Admitting: Radiation Oncology

## 2018-04-08 ENCOUNTER — Encounter: Payer: Self-pay | Admitting: Radiation Oncology

## 2018-04-08 ENCOUNTER — Encounter: Payer: Self-pay | Admitting: Internal Medicine

## 2018-04-08 ENCOUNTER — Inpatient Hospital Stay: Payer: Medicare Other

## 2018-04-08 ENCOUNTER — Other Ambulatory Visit: Payer: Self-pay

## 2018-04-08 ENCOUNTER — Ambulatory Visit: Payer: Medicare Other

## 2018-04-08 VITALS — BP 99/65 | HR 105 | Temp 97.3°F | Resp 18 | Wt 106.4 lb

## 2018-04-08 DIAGNOSIS — K5900 Constipation, unspecified: Secondary | ICD-10-CM | POA: Diagnosis not present

## 2018-04-08 DIAGNOSIS — C778 Secondary and unspecified malignant neoplasm of lymph nodes of multiple regions: Secondary | ICD-10-CM | POA: Insufficient documentation

## 2018-04-08 DIAGNOSIS — C342 Malignant neoplasm of middle lobe, bronchus or lung: Secondary | ICD-10-CM | POA: Diagnosis not present

## 2018-04-08 DIAGNOSIS — R109 Unspecified abdominal pain: Secondary | ICD-10-CM | POA: Diagnosis not present

## 2018-04-08 DIAGNOSIS — C3491 Malignant neoplasm of unspecified part of right bronchus or lung: Secondary | ICD-10-CM

## 2018-04-08 DIAGNOSIS — C770 Secondary and unspecified malignant neoplasm of lymph nodes of head, face and neck: Secondary | ICD-10-CM

## 2018-04-08 DIAGNOSIS — C7931 Secondary malignant neoplasm of brain: Secondary | ICD-10-CM | POA: Diagnosis not present

## 2018-04-08 DIAGNOSIS — C772 Secondary and unspecified malignant neoplasm of intra-abdominal lymph nodes: Secondary | ICD-10-CM | POA: Insufficient documentation

## 2018-04-08 DIAGNOSIS — C7951 Secondary malignant neoplasm of bone: Secondary | ICD-10-CM | POA: Insufficient documentation

## 2018-04-08 DIAGNOSIS — R110 Nausea: Secondary | ICD-10-CM | POA: Insufficient documentation

## 2018-04-08 DIAGNOSIS — Z79899 Other long term (current) drug therapy: Secondary | ICD-10-CM | POA: Diagnosis not present

## 2018-04-08 DIAGNOSIS — E876 Hypokalemia: Secondary | ICD-10-CM | POA: Diagnosis not present

## 2018-04-08 DIAGNOSIS — C7972 Secondary malignant neoplasm of left adrenal gland: Secondary | ICD-10-CM | POA: Diagnosis not present

## 2018-04-08 DIAGNOSIS — Z923 Personal history of irradiation: Secondary | ICD-10-CM | POA: Diagnosis not present

## 2018-04-08 LAB — CBC WITH DIFFERENTIAL (CANCER CENTER ONLY)
Abs Immature Granulocytes: 0.02 10*3/uL (ref 0.00–0.07)
Basophils Absolute: 0 10*3/uL (ref 0.0–0.1)
Basophils Relative: 1 %
Eosinophils Absolute: 0.1 10*3/uL (ref 0.0–0.5)
Eosinophils Relative: 1 %
HEMATOCRIT: 42.6 % (ref 36.0–46.0)
Hemoglobin: 13.9 g/dL (ref 12.0–15.0)
Immature Granulocytes: 0 %
LYMPHS ABS: 0.9 10*3/uL (ref 0.7–4.0)
Lymphocytes Relative: 15 %
MCH: 31.3 pg (ref 26.0–34.0)
MCHC: 32.6 g/dL (ref 30.0–36.0)
MCV: 95.9 fL (ref 80.0–100.0)
MONO ABS: 0.5 10*3/uL (ref 0.1–1.0)
MONOS PCT: 9 %
Neutro Abs: 4.2 10*3/uL (ref 1.7–7.7)
Neutrophils Relative %: 74 %
Platelet Count: 297 10*3/uL (ref 150–400)
RBC: 4.44 MIL/uL (ref 3.87–5.11)
RDW: 12.8 % (ref 11.5–15.5)
WBC Count: 5.8 10*3/uL (ref 4.0–10.5)
nRBC: 0 % (ref 0.0–0.2)

## 2018-04-08 LAB — CMP (CANCER CENTER ONLY)
ALT: 35 U/L (ref 0–44)
AST: 31 U/L (ref 15–41)
Albumin: 3.8 g/dL (ref 3.5–5.0)
Alkaline Phosphatase: 64 U/L (ref 38–126)
Anion gap: 11 (ref 5–15)
BUN: 10 mg/dL (ref 6–20)
CO2: 26 mmol/L (ref 22–32)
Calcium: 8.8 mg/dL — ABNORMAL LOW (ref 8.9–10.3)
Chloride: 102 mmol/L (ref 98–111)
Creatinine: 0.7 mg/dL (ref 0.44–1.00)
GFR, Est AFR Am: >60 mL/min (ref >60)
GFR, Estimated: >60 mL/min (ref >60)
GLUCOSE: 82 mg/dL (ref 70–99)
Potassium: 4.1 mmol/L (ref 3.5–5.1)
Sodium: 139 mmol/L (ref 135–145)
Total Bilirubin: 0.4 mg/dL (ref 0.3–1.2)
Total Protein: 7.5 g/dL (ref 6.5–8.1)

## 2018-04-08 MED ORDER — ONDANSETRON HCL 8 MG PO TABS: 8.0000 mg | ORAL_TABLET | Freq: Three times a day (TID) | ORAL | 0 refills | Status: DC | PRN

## 2018-04-08 MED FILL — ONDANSETRON HCL 8 MG TABLET: 8 | 6 days supply | Qty: 20 | Fill #0

## 2018-04-08 NOTE — Progress Notes (Signed)
Patient states that she did not need an interpretor . Ask several times states that she understood what I was asking her.
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
## 2018-04-09 ENCOUNTER — Ambulatory Visit
Admission: RE | Admit: 2018-04-09 | Discharge: 2018-04-09 | Disposition: A | Discharge status: Home / Self Care | Payer: Medicare Other | Source: Ambulatory Visit | Attending: Radiation Oncology | Admitting: Radiation Oncology

## 2018-04-09 ENCOUNTER — Other Ambulatory Visit: Payer: Self-pay

## 2018-04-09 ENCOUNTER — Encounter: Payer: Self-pay | Admitting: Nutrition

## 2018-04-09 DIAGNOSIS — C3491 Malignant neoplasm of unspecified part of right bronchus or lung: Secondary | ICD-10-CM | POA: Insufficient documentation

## 2018-04-09 DIAGNOSIS — C7931 Secondary malignant neoplasm of brain: Secondary | ICD-10-CM | POA: Diagnosis not present

## 2018-04-09 DIAGNOSIS — C772 Secondary and unspecified malignant neoplasm of intra-abdominal lymph nodes: Secondary | ICD-10-CM | POA: Insufficient documentation

## 2018-04-09 DIAGNOSIS — C770 Secondary and unspecified malignant neoplasm of lymph nodes of head, face and neck: Secondary | ICD-10-CM | POA: Diagnosis not present

## 2018-04-09 DIAGNOSIS — Z510 Encounter for antineoplastic radiation therapy: Secondary | ICD-10-CM | POA: Insufficient documentation

## 2018-04-09 DIAGNOSIS — C7951 Secondary malignant neoplasm of bone: Secondary | ICD-10-CM

## 2018-04-09 NOTE — Progress Notes (Signed)
Provided one complimentary case ensure enlive.

## 2018-04-15 DIAGNOSIS — Z510 Encounter for antineoplastic radiation therapy: Secondary | ICD-10-CM | POA: Diagnosis not present

## 2018-04-16 ENCOUNTER — Other Ambulatory Visit: Payer: Self-pay

## 2018-04-16 ENCOUNTER — Ambulatory Visit
Admission: RE | Admit: 2018-04-16 | Discharge: 2018-04-16 | Disposition: A | Discharge status: Home / Self Care | Payer: Medicare Other | Source: Ambulatory Visit | Attending: Radiation Oncology | Admitting: Radiation Oncology

## 2018-04-16 DIAGNOSIS — C772 Secondary and unspecified malignant neoplasm of intra-abdominal lymph nodes: Secondary | ICD-10-CM | POA: Diagnosis not present

## 2018-04-16 DIAGNOSIS — Z510 Encounter for antineoplastic radiation therapy: Secondary | ICD-10-CM | POA: Insufficient documentation

## 2018-04-16 DIAGNOSIS — C7931 Secondary malignant neoplasm of brain: Secondary | ICD-10-CM | POA: Diagnosis not present

## 2018-04-16 DIAGNOSIS — C3491 Malignant neoplasm of unspecified part of right bronchus or lung: Secondary | ICD-10-CM | POA: Diagnosis not present

## 2018-04-16 DIAGNOSIS — C770 Secondary and unspecified malignant neoplasm of lymph nodes of head, face and neck: Secondary | ICD-10-CM | POA: Diagnosis not present

## 2018-04-16 LAB — GUARDANT 360

## 2018-04-17 ENCOUNTER — Ambulatory Visit
Admission: RE | Admit: 2018-04-17 | Discharge: 2018-04-17 | Disposition: A | Discharge status: Home / Self Care | Payer: Medicare Other | Source: Ambulatory Visit | Attending: Radiation Oncology | Admitting: Radiation Oncology

## 2018-04-17 ENCOUNTER — Other Ambulatory Visit: Payer: Self-pay

## 2018-04-17 DIAGNOSIS — Z510 Encounter for antineoplastic radiation therapy: Secondary | ICD-10-CM | POA: Diagnosis not present

## 2018-04-18 ENCOUNTER — Ambulatory Visit
Admission: RE | Admit: 2018-04-18 | Discharge: 2018-04-18 | Disposition: A | Discharge status: Home / Self Care | Payer: Medicare Other | Source: Ambulatory Visit | Attending: Radiation Oncology | Admitting: Radiation Oncology

## 2018-04-18 ENCOUNTER — Other Ambulatory Visit: Payer: Self-pay | Admitting: Internal Medicine

## 2018-04-18 DIAGNOSIS — Z510 Encounter for antineoplastic radiation therapy: Secondary | ICD-10-CM | POA: Diagnosis not present

## 2018-04-21 ENCOUNTER — Other Ambulatory Visit: Payer: Self-pay

## 2018-04-21 ENCOUNTER — Other Ambulatory Visit: Payer: Self-pay | Admitting: Radiation Oncology

## 2018-04-21 ENCOUNTER — Telehealth: Payer: Self-pay | Admitting: Medical Oncology

## 2018-04-21 ENCOUNTER — Ambulatory Visit
Admission: RE | Admit: 2018-04-21 | Discharge: 2018-04-21 | Disposition: A | Discharge status: Home / Self Care | Payer: Medicare Other | Source: Ambulatory Visit | Attending: Radiation Oncology | Admitting: Radiation Oncology

## 2018-04-21 ENCOUNTER — Telehealth: Payer: Self-pay | Admitting: *Deleted

## 2018-04-21 DIAGNOSIS — C3491 Malignant neoplasm of unspecified part of right bronchus or lung: Secondary | ICD-10-CM

## 2018-04-21 DIAGNOSIS — Z510 Encounter for antineoplastic radiation therapy: Secondary | ICD-10-CM | POA: Diagnosis not present

## 2018-04-21 LAB — CBC WITH DIFFERENTIAL (CANCER CENTER ONLY)
Abs Immature Granulocytes: 0.02 10*3/uL (ref 0.00–0.07)
Basophils Absolute: 0 10*3/uL (ref 0.0–0.1)
Basophils Relative: 0 %
Eosinophils Absolute: 0.1 10*3/uL (ref 0.0–0.5)
Eosinophils Relative: 2 %
HCT: 38.7 % (ref 36.0–46.0)
Hemoglobin: 12.6 g/dL (ref 12.0–15.0)
Immature Granulocytes: 0 %
Lymphocytes Relative: 9 %
Lymphs Abs: 0.5 10*3/uL — ABNORMAL LOW (ref 0.7–4.0)
MCH: 31.7 pg (ref 26.0–34.0)
MCHC: 32.6 g/dL (ref 30.0–36.0)
MCV: 97.5 fL (ref 80.0–100.0)
Monocytes Absolute: 0.4 10*3/uL (ref 0.1–1.0)
Monocytes Relative: 7 %
Neutro Abs: 4.9 10*3/uL (ref 1.7–7.7)
Neutrophils Relative %: 82 %
Platelet Count: 147 10*3/uL — ABNORMAL LOW (ref 150–400)
RBC: 3.97 MIL/uL (ref 3.87–5.11)
RDW: 12.6 % (ref 11.5–15.5)
WBC Count: 6 10*3/uL (ref 4.0–10.5)
nRBC: 0 % (ref 0.0–0.2)

## 2018-04-21 MED FILL — PROCHLORPERAZINE 10 MG TAB: 10 | 7 days supply | Qty: 30 | Fill #0

## 2018-04-21 NOTE — Telephone Encounter (Signed)
hemoptysis started this am x 3 episodes. States a little more than a spoonful. Denies chest pain , dizziness. She is going to xrt today .

## 2018-04-21 NOTE — Telephone Encounter (Signed)
Hemoptysis- Per Dr Julien Nordmann , I instructed pt to hold Xarelto for 2 days. She already took today's dose so I told her to hold xarelto tuesday and Wednesday. She voiced understanding.

## 2018-04-21 NOTE — Telephone Encounter (Signed)
Spoke with the patient regarding her hemoptysis.  She states that she coughed up about 1 tablespoon of blood on one occasion and all other episodes it was just a little bit.  I verified that the patient is still taking xarelto.  I spoke with PA Dara Lords and she wants the patient to continue to monitor the hemoptysis and if it should worsen to let out office as well as Dr. Worthy Flank office know.  I informed the patient that we would obtain some blood work to check her hemoglobin levels.  I let her know to arrive a little early for her treatment so she could stop at lab first.  Patient verbalized understanding.  Will continue to follow.  Gloriajean Dell. Leonie Green, BSN

## 2018-04-22 ENCOUNTER — Other Ambulatory Visit: Payer: Self-pay

## 2018-04-22 ENCOUNTER — Ambulatory Visit
Admission: RE | Admit: 2018-04-22 | Discharge: 2018-04-22 | Disposition: A | Discharge status: Home / Self Care | Payer: Medicare Other | Source: Ambulatory Visit | Attending: Radiation Oncology | Admitting: Radiation Oncology

## 2018-04-22 DIAGNOSIS — Z510 Encounter for antineoplastic radiation therapy: Secondary | ICD-10-CM | POA: Diagnosis not present

## 2018-04-23 ENCOUNTER — Other Ambulatory Visit: Payer: Self-pay

## 2018-04-23 ENCOUNTER — Ambulatory Visit
Admission: RE | Admit: 2018-04-23 | Discharge: 2018-04-23 | Disposition: A | Discharge status: Home / Self Care | Payer: Medicare Other | Source: Ambulatory Visit | Attending: Radiation Oncology | Admitting: Radiation Oncology

## 2018-04-23 DIAGNOSIS — Z510 Encounter for antineoplastic radiation therapy: Secondary | ICD-10-CM | POA: Diagnosis not present

## 2018-04-24 ENCOUNTER — Ambulatory Visit
Admission: RE | Admit: 2018-04-24 | Discharge: 2018-04-24 | Disposition: A | Discharge status: Home / Self Care | Payer: Medicare Other | Source: Ambulatory Visit | Attending: Radiation Oncology | Admitting: Radiation Oncology

## 2018-04-24 ENCOUNTER — Other Ambulatory Visit: Payer: Self-pay

## 2018-04-24 DIAGNOSIS — Z510 Encounter for antineoplastic radiation therapy: Secondary | ICD-10-CM | POA: Diagnosis not present

## 2018-04-25 ENCOUNTER — Other Ambulatory Visit: Payer: Self-pay

## 2018-04-25 ENCOUNTER — Ambulatory Visit
Admission: RE | Admit: 2018-04-25 | Discharge: 2018-04-25 | Disposition: A | Discharge status: Home / Self Care | Payer: Medicare Other | Source: Ambulatory Visit | Attending: Radiation Oncology | Admitting: Radiation Oncology

## 2018-04-25 DIAGNOSIS — Z510 Encounter for antineoplastic radiation therapy: Secondary | ICD-10-CM | POA: Diagnosis not present

## 2018-04-28 ENCOUNTER — Ambulatory Visit
Admission: RE | Admit: 2018-04-28 | Discharge: 2018-04-28 | Disposition: A | Discharge status: Home / Self Care | Payer: Medicare Other | Source: Ambulatory Visit | Attending: Radiation Oncology | Admitting: Radiation Oncology

## 2018-04-28 ENCOUNTER — Other Ambulatory Visit: Payer: Self-pay

## 2018-04-28 DIAGNOSIS — Z510 Encounter for antineoplastic radiation therapy: Secondary | ICD-10-CM | POA: Diagnosis not present

## 2018-04-29 ENCOUNTER — Ambulatory Visit
Admission: RE | Admit: 2018-04-29 | Discharge: 2018-04-29 | Disposition: A | Discharge status: Home / Self Care | Payer: Medicare Other | Source: Ambulatory Visit | Attending: Radiation Oncology | Admitting: Radiation Oncology

## 2018-04-29 ENCOUNTER — Encounter: Payer: Self-pay | Admitting: Radiation Oncology

## 2018-04-29 ENCOUNTER — Other Ambulatory Visit: Payer: Self-pay

## 2018-04-29 DIAGNOSIS — Z510 Encounter for antineoplastic radiation therapy: Secondary | ICD-10-CM | POA: Diagnosis not present

## 2018-04-29 NOTE — Progress Notes (Signed)
Per MD: "Let us delay her Delton See for the next visit. She can do the port flush and visit on 4/20" Kennith Center, Pharm.D., CPP 04/29/2018@5 :12 PM

## 2018-04-30 ENCOUNTER — Telehealth: Payer: Self-pay | Admitting: Physician Assistant

## 2018-04-30 ENCOUNTER — Telehealth: Payer: Self-pay | Admitting: Radiation Oncology

## 2018-04-30 NOTE — Telephone Encounter (Signed)
Called patient regarding cancellation and updated appointments with an interpreter, patient is notified.

## 2018-05-01 ENCOUNTER — Inpatient Hospital Stay: Payer: Medicare Other

## 2018-05-01 MED FILL — TAGRISSO 80 MG TABLET: 80 | 30 days supply | Qty: 30 | Fill #11

## 2018-05-01 NOTE — Telephone Encounter (Addendum)
I called the patient with the help of pacific interpretors but the patient was not home and a message was left for her to call us back.

## 2018-05-05 ENCOUNTER — Telehealth: Payer: Self-pay | Admitting: Pharmacist

## 2018-05-05 ENCOUNTER — Other Ambulatory Visit: Payer: Self-pay | Admitting: Physician Assistant

## 2018-05-05 ENCOUNTER — Encounter: Payer: Self-pay | Admitting: Radiation Oncology

## 2018-05-05 ENCOUNTER — Ambulatory Visit: Payer: Medicare Other

## 2018-05-05 ENCOUNTER — Other Ambulatory Visit: Payer: Self-pay | Admitting: Radiation Therapy

## 2018-05-05 ENCOUNTER — Encounter: Payer: Self-pay | Admitting: Physician Assistant

## 2018-05-05 ENCOUNTER — Inpatient Hospital Stay: Payer: Medicare Other | Attending: Internal Medicine

## 2018-05-05 ENCOUNTER — Other Ambulatory Visit: Payer: Self-pay

## 2018-05-05 ENCOUNTER — Inpatient Hospital Stay (HOSPITAL_BASED_OUTPATIENT_CLINIC_OR_DEPARTMENT_OTHER): Payer: Medicare Other | Admitting: Physician Assistant

## 2018-05-05 VITALS — BP 104/64 | HR 120 | Temp 98.0°F | Resp 17 | Ht 59.0 in | Wt 103.0 lb

## 2018-05-05 DIAGNOSIS — R51 Headache: Secondary | ICD-10-CM

## 2018-05-05 DIAGNOSIS — C3491 Malignant neoplasm of unspecified part of right bronchus or lung: Secondary | ICD-10-CM

## 2018-05-05 DIAGNOSIS — R112 Nausea with vomiting, unspecified: Secondary | ICD-10-CM

## 2018-05-05 DIAGNOSIS — Z7951 Long term (current) use of inhaled steroids: Secondary | ICD-10-CM | POA: Diagnosis not present

## 2018-05-05 DIAGNOSIS — Z79899 Other long term (current) drug therapy: Secondary | ICD-10-CM | POA: Insufficient documentation

## 2018-05-05 DIAGNOSIS — C7951 Secondary malignant neoplasm of bone: Secondary | ICD-10-CM

## 2018-05-05 DIAGNOSIS — C7972 Secondary malignant neoplasm of left adrenal gland: Secondary | ICD-10-CM

## 2018-05-05 DIAGNOSIS — C7931 Secondary malignant neoplasm of brain: Secondary | ICD-10-CM | POA: Diagnosis not present

## 2018-05-05 DIAGNOSIS — Z7189 Other specified counseling: Secondary | ICD-10-CM | POA: Insufficient documentation

## 2018-05-05 DIAGNOSIS — Z5111 Encounter for antineoplastic chemotherapy: Secondary | ICD-10-CM

## 2018-05-05 DIAGNOSIS — C770 Secondary and unspecified malignant neoplasm of lymph nodes of head, face and neck: Secondary | ICD-10-CM

## 2018-05-05 DIAGNOSIS — R1031 Right lower quadrant pain: Secondary | ICD-10-CM

## 2018-05-05 DIAGNOSIS — K30 Functional dyspepsia: Secondary | ICD-10-CM

## 2018-05-05 DIAGNOSIS — R042 Hemoptysis: Secondary | ICD-10-CM | POA: Insufficient documentation

## 2018-05-05 DIAGNOSIS — C342 Malignant neoplasm of middle lobe, bronchus or lung: Secondary | ICD-10-CM

## 2018-05-05 DIAGNOSIS — R1013 Epigastric pain: Secondary | ICD-10-CM

## 2018-05-05 DIAGNOSIS — C7949 Secondary malignant neoplasm of other parts of nervous system: Principal | ICD-10-CM

## 2018-05-05 DIAGNOSIS — H538 Other visual disturbances: Secondary | ICD-10-CM | POA: Insufficient documentation

## 2018-05-05 DIAGNOSIS — R197 Diarrhea, unspecified: Secondary | ICD-10-CM

## 2018-05-05 DIAGNOSIS — Z923 Personal history of irradiation: Secondary | ICD-10-CM | POA: Insufficient documentation

## 2018-05-05 DIAGNOSIS — I82442 Acute embolism and thrombosis of left tibial vein: Secondary | ICD-10-CM

## 2018-05-05 LAB — CBC WITH DIFFERENTIAL (CANCER CENTER ONLY)
Abs Immature Granulocytes: 0.03 10*3/uL (ref 0.00–0.07)
Basophils Absolute: 0 10*3/uL (ref 0.0–0.1)
Basophils Relative: 1 %
Eosinophils Absolute: 0.5 10*3/uL (ref 0.0–0.5)
Eosinophils Relative: 16 %
HCT: 39.4 % (ref 36.0–46.0)
Hemoglobin: 13.1 g/dL (ref 12.0–15.0)
Immature Granulocytes: 1 %
Lymphocytes Relative: 8 %
Lymphs Abs: 0.3 10*3/uL — ABNORMAL LOW (ref 0.7–4.0)
MCH: 31.7 pg (ref 26.0–34.0)
MCHC: 33.2 g/dL (ref 30.0–36.0)
MCV: 95.4 fL (ref 80.0–100.0)
Monocytes Absolute: 0.5 10*3/uL (ref 0.1–1.0)
Monocytes Relative: 14 %
Neutro Abs: 2.1 10*3/uL (ref 1.7–7.7)
Neutrophils Relative %: 60 %
Platelet Count: 178 10*3/uL (ref 150–400)
RBC: 4.13 MIL/uL (ref 3.87–5.11)
RDW: 12.7 % (ref 11.5–15.5)
WBC Count: 3.4 10*3/uL — ABNORMAL LOW (ref 4.0–10.5)
nRBC: 0 % (ref 0.0–0.2)

## 2018-05-05 LAB — CMP (CANCER CENTER ONLY)
ALT: 38 U/L (ref 0–44)
AST: 25 U/L (ref 15–41)
Albumin: 3.8 g/dL (ref 3.5–5.0)
Alkaline Phosphatase: 58 U/L (ref 38–126)
Anion gap: 9 (ref 5–15)
BUN: 11 mg/dL (ref 6–20)
CO2: 27 mmol/L (ref 22–32)
Calcium: 8.4 mg/dL — ABNORMAL LOW (ref 8.9–10.3)
Chloride: 101 mmol/L (ref 98–111)
Creatinine: 0.73 mg/dL (ref 0.44–1.00)
GFR, Est AFR Am: >60 mL/min (ref >60)
GFR, Estimated: >60 mL/min (ref >60)
Glucose, Bld: 91 mg/dL (ref 70–99)
Potassium: 4.2 mmol/L (ref 3.5–5.1)
Sodium: 137 mmol/L (ref 135–145)
Total Bilirubin: 0.4 mg/dL (ref 0.3–1.2)
Total Protein: 7.2 g/dL (ref 6.5–8.1)

## 2018-05-05 MED ORDER — RIVAROXABAN 20 MG PO TABS: 20.0000 mg | ORAL_TABLET | Freq: Every day | ORAL | 1 refills | Status: DC

## 2018-05-05 MED ORDER — CRIZOTINIB 250 MG PO CAPS: 250.0000 mg | ORAL_CAPSULE | Freq: Two times a day (BID) | ORAL | 2 refills | Status: DC

## 2018-05-05 MED FILL — XARELTO 20 MG TABLET: 20 | 30 days supply | Qty: 30 | Fill #0

## 2018-05-05 NOTE — Telephone Encounter (Signed)
Oral Chemotherapy Pharmacist Encounter   I spoke with patient for overview of: Xalkori (crizotinib) for the treatment of metastatic non small cell lung cancer, now with MET-amplification, planned duration until disease progression or unacceptable toxicity.   Patient knows she is also continuing on Tagrisso '80mg'$  once daily.  Counseled patient on administration, dosing, side effects, monitoring, drug-food interactions, safe handling, storage, and disposal.  Patient will take Xalkori '250mg'$  capsules, 1 capsule by mouth 2 times daily, with or with out food..  Patient knows to avoid grapefruit and grapefruit juice while on treatment with Xalkori.  Xalkori start date: 05/08/18  Adverse effects include but are not limited to: diarrhea, nausea, constipation, fatigue, dizziness, decreased blood counts, abnormal electrolytes, hepatotoxicity, pulmonary toxicity, cardiac conduction changes, and visual disturbances.    Patient instructed that visual disturbances can occur within the first week of treatment with Xalkori. She is already experiencing vision issues and will alert the office with any changes that may occur.  Patient has anti-emetic on hand and knows to take it if nausea develops.   Patient will obtain anti diarrheal and alert the office of 4 or more loose stools above baseline.  Reviewed with patient importance of keeping a medication schedule and plan for any missed doses.  Deborah Foster voiced understanding and appreciation.   All questions answered. Medication reconciliation performed and medication/allergy list updated.  Insurance authorization for Hulda Humphrey is not required at this time. Test claim at the [facility] revealed copayment $0 We will coordinate Xalkori shipment from the pharmacy tomorrow (4/21) for delivery to patient's home 4/22. Xarelto should be shipping from the pharmacy tomorrow as well.  Patient states she is not sleeping very much or eating very  much. She did not experience much relief with Remeron '15mg'$  prescribed by Dr. Julien Nordmann last month on 3/5 with 0 refills. I will alert MD to this.  Patient knows to call the office with questions or concerns. Oral Oncology Clinic will continue to follow.  Johny Drilling, PharmD, BCPS, BCOP  05/05/2018   3:12 PM Oral Oncology Clinic [phone]

## 2018-05-05 NOTE — Progress Notes (Signed)
[facility] OFFICE PROGRESS NOTE  [name], MD [place] [id number]  DIAGNOSIS: Stage IV (T2a, N1, M1b) non-small cell lung cancer, adenocarcinoma with positive EGFR mutation (deletion 86) presented with right middle lobe lung mass, right hilar adenopathy as well as metastatic disease to the bone, brain and left adrenal diagnosed in July 2016.  PRIOR THERAPY: 1) palliative radiotherapy to the brain as well as metastatic bone lesions in the pelvis. 2) Tarceva 150 mg by mouth daily started on 09/25/2014. Status post 5 months of treatment. 3) status post palliative radiotherapy to the right middle lobe lung mass for treatment of hemoptysis. 4) Tarceva 100 mg by mouth daily started 02/19/2015 status post 13 months of treatment. This was discontinued in May 2018 after the patient had disease progression and development of T790M resistant mutation.  5) palliative stereotactic radiotherapy to the left adrenal gland metastatic lesion under the care of Dr. Lisbeth Renshaw completed on April 10, 2017 6) Pallative sterotactic radiotherapy to the left supraclavicular and retroperitoneal lymphadenpathy under the care of Dr. Lisbeth Renshaw completed in April 2020.   CURRENT THERAPY: 1) Tagrisso 80 mg by mouth daily started 06/02/2016.  Status post 23 months of treatment. 2) Xgeva 120 g subcutaneously every 6 weeks. 3) Xalkori 250 mg by mouth BID started 05/05/2018.   INTERVAL HISTORY: Deborah Foster 48 y.o. female returns to the clinic for a follow-up visit. She had a restaging CT scan performed on March 18, 2018 which showed enlarging left supraclavicular and retroperitoneal lymphadenopathy.  She underwent an ultrasound-guided core biopsy which was consistent with metastatic adenocarcinoma. The biopsy was subsequently sent for foundation 1 testing to assess for the development of any new resistant mutations.  In the meantime, she has been continuing treatment with Tagrisso 80 mg by  mouth daily. She was also sent to radiation oncology. She has completed about 2-3 weeks of palliative radiotherapy to the enlarging lymph nodes.   However, the patient is feeling unwell today and is complaining of epigastric pain that started around the time she started radiation therapy for her enlarging retroperitoneal lymph nodes. She states she has associated dyspepsia, vomiting, and about a 2lb weight loss over the last month or so. She characterizes her pain as "uncomfortable" and is unable to associate the onset of her pain with any particular activities or time of day. She denies any associated fevers or chills. She states she had black stools about 3 weeks ago but has not had any since that time. She reports diarrhea for the last 3 days from taking too much stool softener.     At her last medical oncology visit , she was found to have a DVT in her left posterior tibial vein.  She was subsequently started on Xarelto.  About 2-3 weeks ago she developed an episode of hemoptysis and one episode of bright red blood in her stool with a bowel movement. She states she coughed up about 1 spoonful of blood. She was instructed to hold her Xarelto for 2 days.  Since this time, she has resumed her Xarelto without any more episodes of bleeding. She denies any epistaxis, hematuria, or gingival bleeding.  She also is endorsing some visual changes. She states this has been occurring for approximately 3 months or so. She states her vision is blurry and she has "floaters" several times a day. She also reports an associated headache with the onset of symptoms that lasts about 10 minutes. She localizes her pain between  her eyebrows. She denies any double vision, eye pain with EOM, photophobia, eye discharge, gait changes, dizziness, or associated nausea or vomiting. She is followed closely by radiation oncology for her history of metastatic disease to the brain.   She is here today for evaluation and to review the  results of her molecular studies.    MEDICAL HISTORY: Past Medical History:  Diagnosis Date  . Bone metastasis ([place])   . Hemoptysis   . Hypokalemia   . lung ca dx'd 07/2014  . Lung mass   . Metastasis to adrenal gland ([place])   . Metastasis to brain ([place])   . Pneumonia   . Radiation 08/23/14-09/07/14   Brain/chest and left hip 30 Gy 12 Fx  . URI (upper respiratory infection) 02-02-2015    ALLERGIES:  is allergic to other and doxycycline.  MEDICATIONS:  Current Outpatient Medications  Medication Sig Dispense Refill  . AMBULATORY NON FORMULARY MEDICATION Medication Name: MAGIC MOUTHWASH 2 % Viscous Lidocaine  Maalox Benadryl Susp.  Disp: 1:1:1 215m bottle Sig: 131mPO Swish & Spit  q 3-4hrs. PRN mouth sores (Patient not taking: Reported on 07/29/2017) 200 mL 3  . benzonatate (TESSALON) 200 MG capsule TK ONE C PO Q 8 H PRN COU  0  . Calcium Carbonate-Vitamin D (CALCIUM 500 + D) 500-125 MG-UNIT TABS Take 500 mg by mouth 2 (two) times daily.    . chlorpheniramine-HYDROcodone (TUSSIONEX) 10-8 MG/5ML SUER Take 5 mLs by mouth every 12 (twelve) hours as needed. (Patient not taking: Reported on 04/08/2018) 140 mL 0  . clindamycin (CLINDAGEL) 1 % gel Apply 1 application topically 2 (two) times daily.    . clobetasol cream (TEMOVATE) 0.6.80 Apply 1 application topically 2 (two) times daily.    . crizotinib (XALKORI) 250 MG capsule Take 1 capsule (250 mg total) by mouth 2 (two) times daily. 60 capsule 2  . Cyanocobalamin (VITAMIN B-12) 2500 MCG SUBL Place 2,500 mcg under the tongue daily.    . Marland Kitchenextromethorphan Polistirex (DELSYM PO) Take by mouth.    . ENSURE (ENSURE) Take 1 Can by mouth 2 (two) times daily between meals.     . ferrous sulfate 325 (65 FE) MG EC tablet Take 325 mg by mouth daily with breakfast.    . fluticasone (VERAMYST) 27.5 MCG/SPRAY nasal spray Place 2 sprays into the nose daily.    . folic acid (FOLVITE) 80881CG tablet Take 1 tablet (800 mcg total) by mouth daily. 30 tablet  1  . loratadine (CLARITIN) 10 MG tablet Take 1 tablet (10 mg total) by mouth daily. 30 tablet 0  . mirtazapine (REMERON) 15 MG tablet Take 1 tablet (15 mg total) by mouth at bedtime. 30 tablet 0  . Nutritional Supplements (JUICE PLUS FIBRE PO) Take 1 capsule by mouth daily. Reported on 07/28/2015    . omeprazole (PRILOSEC) 20 MG capsule Take 20 mg by mouth daily.    . ondansetron (ZOFRAN) 8 MG tablet Take 1 tablet (8 mg total) by mouth every 8 (eight) hours as needed for nausea or vomiting. 20 tablet 0  . osimertinib mesylate (TAGRISSO) 80 MG tablet Take 1 tablet (80 mg total) by mouth daily. 30 tablet 2  . OVER THE COUNTER MEDICATION "Deep penetrating pain relief oil"    . prochlorperazine (COMPAZINE) 10 MG tablet TAKE 1 TABLET BY MOUTH EVERY 6 HOURS AS NEEDED FOR NAUSEA OR VOMITING. 30 tablet 0  . ranitidine (ZANTAC) 300 MG tablet Take 1 tablet (300 mg total) by mouth at  bedtime. (Patient not taking: Reported on 04/08/2018) 30 tablet 6  . rivaroxaban (XARELTO) 20 MG TABS tablet Take 1 tablet (20 mg total) by mouth daily with supper. 30 tablet 1  . TURMERIC PO Take 1 capsule by mouth 2 (two) times daily. Turmeric Superior 750 mg po bid     No current facility-administered medications for this visit.     SURGICAL HISTORY:  Past Surgical History:  Procedure Laterality Date  . IR GENERIC HISTORICAL  07/14/2015   IR RADIOLOGIST EVAL & MGMT 07/14/2015 Greggory Keen, MD [facility] INTERV RAD  . VIDEO BRONCHOSCOPY Bilateral 08/09/2014   Procedure: VIDEO BRONCHOSCOPY WITHOUT FLUORO;  Surgeon: [name], MD;  Location: [facility] ENDOSCOPY;  Service: Cardiopulmonary;  Laterality: Bilateral;  . VIDEO BRONCHOSCOPY Bilateral 07/06/2015   Procedure: VIDEO BRONCHOSCOPY WITHOUT FLUORO;  Surgeon: [name], MD;  Location: [facility] ENDOSCOPY;  Service: Cardiopulmonary;  Laterality: Bilateral;    REVIEW OF SYSTEMS:   Review of Systems  Constitutional: Positive for appetite change and ~2lb weight loss over the  last month. Negative for chills, fatigue, and fever  HENT:   Negative for mouth sores, nosebleeds, sore throat and trouble swallowing.   Eyes: Positive for blurry vision and "floaters". Negative for photophobia, pain with EOM, discharge, or icterus.  Respiratory: Positive for hemoptysis. Negative for cough, shortness of breath and wheezing.   Cardiovascular: Negative for chest pain and leg swelling.  Gastrointestinal: Positive for epigastric pain, nausea, vomiting, and diarrhea. Negative for constipation.  Genitourinary: Negative for bladder incontinence, difficulty urinating, dysuria, frequency and hematuria.   Musculoskeletal: Negative for back pain, gait problem, neck pain and neck stiffness.  Skin: Negative for itching and rash.  Neurological: Positive for a headache near her frontal sinus. Negative for dizziness, extremity weakness, gait problem, light-headedness and seizures.  Hematological: Negative for adenopathy. Does not bruise/bleed easily.  Psychiatric/Behavioral: Negative for confusion, depression and sleep disturbance. The patient is not nervous/anxious.     PHYSICAL EXAMINATION:  Blood pressure 104/64, pulse (!) 120, temperature 98 F (36.7 C), temperature source Oral, resp. rate 17, height _0  (1.499 m), weight 103 lb (46.7 kg), SpO2 100 %.  ECOG PERFORMANCE STATUS: 1 - Symptomatic but completely ambulatory  Physical Exam  Constitutional: Oriented to person, place, and time and well-developed, well-nourished, and in no distress.  HENT:  Head: Normocephalic and atraumatic.  Mouth/Throat: Oropharynx is clear and moist. No oropharyngeal exudate.  Eyes: Conjunctivae are normal. Right eye exhibits no discharge. Left eye exhibits no discharge. No scleral icterus.  Neck: Normal range of motion. Neck supple.  Cardiovascular: Normal rate, regular rhythm, normal heart sounds and intact distal pulses.   Pulmonary/Chest: Effort normal and breath sounds normal. No respiratory  distress. No wheezes. No rales.  Abdominal: Soft. Bowel sounds are normal. Exhibits no distension and no mass. There is no tenderness.  Musculoskeletal: Normal range of motion. Exhibits no edema.  Lymphadenopathy:    No cervical adenopathy.  Neurological: Alert and oriented to person, place, and time. Exhibits normal muscle tone. Gait normal. Coordination normal.  Skin: Skin is warm and dry. No rash noted. Not diaphoretic. No erythema. No pallor.  Psychiatric: Mood, memory and judgment normal.  Vitals reviewed.  LABORATORY DATA: Lab Results  Component Value Date   WBC 3.4 (L) 05/05/2018   HGB 13.1 05/05/2018   HCT 39.4 05/05/2018   MCV 95.4 05/05/2018   PLT 178 05/05/2018      Chemistry      Component Value Date/Time   NA 137  05/05/2018 0936   NA 140 01/17/2017 1003   K 4.2 05/05/2018 0936   K 3.9 01/17/2017 1003   CL 101 05/05/2018 0936   CO2 27 05/05/2018 0936   CO2 29 01/17/2017 1003   BUN 11 05/05/2018 0936   BUN 14.8 01/17/2017 1003   CREATININE 0.73 05/05/2018 0936   CREATININE 0.8 01/17/2017 1003      Component Value Date/Time   CALCIUM 8.4 (L) 05/05/2018 0936   CALCIUM 8.7 01/17/2017 1003   ALKPHOS 58 05/05/2018 0936   ALKPHOS 50 01/17/2017 1003   AST 25 05/05/2018 0936   AST 23 01/17/2017 1003   ALT 38 05/05/2018 0936   ALT 27 01/17/2017 1003   BILITOT 0.4 05/05/2018 0936   BILITOT 0.46 01/17/2017 1003       RADIOGRAPHIC STUDIES:  No results found.   ASSESSMENT/PLAN:  This is a very pleasant 48 year old Asian female with stage IV non-small cell lung cancer, adenocarcinoma with a positive EGFR mutation with deletion in exon 101.  She was diagnosed in July 2016.  She is status post 18 months of treatment with Tarceva. This was discontinued due to disease progression and the development of T790M resistant mutation. The patient was then started on treatment with Tagrisso 80 mg by mouth daily status post 23 months of treatment.  She is tolerating treatment  well.  A recent CT scan performed in March 2020 showed evidence of disease progression with enlarging retroperitoneal and left supraclavicular lymphadenopathy.  She recently completed palliative radiotherapy to the enlarging lymph nodes.  The patient's recent biopsy of the left supraclavicular lymph node showed metastatic adenocarcinoma.  She recently had molecular studies by foundation 1 performed which showed that is she is additionally positive for a MET amplification.   The patient was seen with Dr. Julien Nordmann today.  He discussed the results and recommendations with the patient.  Due to her MET amplification, de recommends that the patient start crizotinib (Xalkori) 250 mg by mouth twice daily in addition to her Tagrisso 80 mg by mouth once daily. The patient is interested in this option and is expected to start treatment immediately.   I will see her back in 2-3 weeks for evaluation and to obtain repeat labs.   For her visual disturbances, the patient is followed closely by radiation oncology. They have ordered a repeat brain MRI to be performed in the near future.   For the hemoptysis, this seems to have resolved after the initial 15 mg BID starting dose. The patient is currently on the 20 mg once daily maintenance dose. I have sent a refill to her pharmacy. She knows should she develop any more concerning bleeding symptoms to seek medical evaluation.    The patient receives Xgeva injections every 6 weeks. I will arrange for the patient to receive her injection at her next appointment in 2-3 weeks.   For the abdominal pain, the patient will continue to take her compazine as needed for nausea. She also has Prilosec at home which she has not been taking. I encouraged her to resume taking her Prilosec for her dyspepsia.   The patient was advised to call immediately if she has any concerning symptoms in the interval. The patient voices understanding of current disease status and treatment options  and is in agreement with the current care plan. All questions were answered. The patient knows to call the clinic with any problems, questions or concerns. We can certainly see the patient much sooner if necessary   skip  return 2 weeks.Prilosec. Orders Placed This Encounter  Procedures  . TSH    Standing Status:   Future    Standing Expiration Date:   05/05/2019  . CBC with Differential (Cancer Center Only)    Standing Status:   Future    Standing Expiration Date:   05/05/2019  . CMP ([facility] only)    Standing Status:   Future    Standing Expiration Date:   05/05/2019     Tobe Sos Heilingoetter, PA-C 05/05/18  ADDENDUM: Hematology/Oncology Attending: I had a face-to-face encounter with the patient today.  I recommended her care plan.  She is a very pleasant 48 years old Asian female with metastatic non-small cell lung cancer diagnosed in July 2016 with positive EGFR mutation and she has been on targeted therapy since that time including Tarceva followed by Tagrisso. The patient had evidence for disease progression recently and repeat molecular studies by [facility] showed new met amplification in addition to the EGFR mutation. I had a lengthy discussion with the patient today about her condition and treatment options. I recommended for the patient to continue her current treatment with Tagrisso but we will add Xalkori 250 mg p.o. twice daily to her regimen because of the neck amplification. She is recovering from the recent palliative radiotherapy and she has some abdominal pain.  She will continue Compazine for nausea and will add Prilosec for the indigestion and pain. The patient will come back for follow-up visit in 3 weeks for reevaluation and management of any adverse effect of her treatment. The patient was advised to call immediately if she has any concerning symptoms in the interval.  Disclaimer: This note was dictated with voice recognition software. Similar sounding  words can inadvertently be transcribed and may be missed upon review. Eilleen Kempf, MD 05/05/18

## 2018-05-05 NOTE — Telephone Encounter (Signed)
Oral Oncology Pharmacist Encounter  Received new prescription for Xalkori (crizotinib) for the treatment of metastatic non small cell lung cancer, now with MET-amplification, planned duration until disease progression or unacceptable toxicity.  Patient is currently being treated with Tagrisso (osimertinib) for metastatic non small cell lung cancer with EGFR mutation T790M, start date 06/02/16  Disease progression was noted on imaging in March 2020. Repeat biopsy was performed and sent for molecular testing with [facility]. Patient found to have high-level MET amplification and is under evaluation to add crizotinib 247m BID to treatment regimen.  Labs from 05/05/2018 assessed, OK for treatment initiation. SCr=0.73, round to 0.8 per policy, est CrCl ~ 64 mL/min No dose adjustment per manufacturer for CrCl 30-89 mL/min  HRs in Epic reviewed. Most readings > 100 bpm, bradycardia is possible with Xalkori, will continue to be monitored  Last QTc assessed 07/18/2015 was 440 msec, this was prior to Tagrisso initiation. Both Tagrisso and Xalkori with possibility to prolong QTc, recommend reassessment at next OV Electrolytes will be closely monitored at treatment initiation.  Noted patient with baseline ocular issues including blurry vision and floaters. Will continue to be monitored as ocular toxicity (blurred vision, diplopia, photophobia, photopsia, visual acuity decreased, visual brightness, visual field defect, visual impairment, and/or vitreous floaters) is possible with Xalkori use.  Current medication list in Epic reviewed, DDI with Xalkori and Tagrisso identified:  Both agents can prolong QTc as above. Will discuss with MD.  [facility]  [facility] for insurance authorization, copayment issues, initial counseling and start date.  JJohny Drilling PharmD, BCPS,  BCOP  05/05/2018 1:27 PM Oral Oncology Clinic [phone]

## 2018-05-05 NOTE — Progress Notes (Signed)
The patient complained about blurry vision and double vision at her last visit with Dr. Lisbeth Renshaw. In efforts to follow up we tried calling the interpreter services line last week but they had to leave a message. I touched base with med onc and she saw Cassie Heilingoetter, NP and it sounds as though she's had symptoms with frequent headaches that are persistent. She had not noted this previously to this degree- in January 2020 she told me she had some haziness in her vision. At that time her scan was negative for active disease in the brain. Given the persistence and systemic disease she has, we will proceed with repeat MRI.

## 2018-05-06 MED FILL — XALKORI 250 MG CAPSULE: 250 | 30 days supply | Qty: 60 | Fill #0

## 2018-05-08 NOTE — Telephone Encounter (Signed)
Oral Oncology Patient Advocate Encounter  Confirmed with [facility] that Deborah Foster was shipped on 05/07/18 with a $0 copay.    [place] Patient [place] Phone [phone] Fax [phone] 05/08/2018   1:12 PM

## 2018-05-13 ENCOUNTER — Other Ambulatory Visit: Payer: Self-pay

## 2018-05-13 ENCOUNTER — Ambulatory Visit
Admission: RE | Admit: 2018-05-13 | Discharge: 2018-05-13 | Disposition: A | Discharge status: Home / Self Care | Payer: Medicare Other | Source: Ambulatory Visit | Attending: Radiation Oncology | Admitting: Radiation Oncology

## 2018-05-13 DIAGNOSIS — C7949 Secondary malignant neoplasm of other parts of nervous system: Principal | ICD-10-CM

## 2018-05-13 DIAGNOSIS — C7931 Secondary malignant neoplasm of brain: Secondary | ICD-10-CM

## 2018-05-13 MED ORDER — GADOBENATE DIMEGLUMINE 529 MG/ML IV SOLN
9.0000 mL | Freq: Once | INTRAVENOUS | Status: AC | PRN
  Administered 2018-05-13: 10:00:00 9 mL via INTRAVENOUS

## 2018-05-14 ENCOUNTER — Inpatient Hospital Stay: Payer: Medicare Other

## 2018-05-14 ENCOUNTER — Other Ambulatory Visit: Payer: Self-pay

## 2018-05-14 ENCOUNTER — Telehealth: Payer: Self-pay | Admitting: Radiation Therapy

## 2018-05-14 NOTE — Telephone Encounter (Signed)
Spoke with Deborah Foster about her upcoming visit to see Dr. Mickeal Skinner on 5/4. She has written down the appointment information and plans to be here.   Mont Dutton R.T.(R)(T) Special Procedures Navigator

## 2018-05-15 NOTE — Progress Notes (Signed)
  Radiation Oncology         [phone] ________________________________  Name: Deborah Foster MRN: [id number]  Date: 04/09/2018  DOB: [birth year]  SIMULATION AND TREATMENT PLANNING NOTE  DIAGNOSIS:     ICD-10-CM   1. Bone metastases (HCC) C79.51   2. 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  Abdomen/ retroperitoneal nodes 2.  Left SCLV/ mediastinum  NARRATIVE:  The patient was brought to the [facility].  Identity was confirmed.  All relevant records and images related to the planned course of therapy were reviewed.   Written consent to proceed with treatment was confirmed which was freely given after reviewing the details related to the planned course of therapy had been reviewed with the patient.  Then, the patient was set-up in a stable reproducible  supine position for radiation therapy.  CT images were obtained.  Surface markings were placed.    Medically necessary complex treatment device(s) for immobilization:  Vac-lock bag.   The CT images were loaded into the planning software.  Then the target and avoidance structures were contoured.  Treatment planning then occurred.  The radiation prescription was entered and confirmed.  A total of 9 complex treatment devices were fabricated which relate to the designed radiation treatment fields:  6 fields to treat the chest/ SCLV and 3 fields to treat the abdomen. Each of these customized fields/ complex treatment devices will be used on a daily basis during the radiation course. I have requested : 3D Simulation  I have requested a DVH of the following structures: target volumes, cord, lungs, kidneys, bowel.   The patient will undergo daily image guidance to ensure accurate localization of the target, and adequate minimize dose to the normal surrounding structures in close proximity to the target.   PLAN:  The patient will receive 30 Gy in 10  fractions.  ________________________________   Jodelle Gross, MD, PhD

## 2018-05-19 ENCOUNTER — Inpatient Hospital Stay: Payer: Medicare Other | Attending: Internal Medicine | Admitting: Internal Medicine

## 2018-05-19 ENCOUNTER — Telehealth: Payer: Self-pay | Admitting: Internal Medicine

## 2018-05-19 ENCOUNTER — Other Ambulatory Visit: Payer: Self-pay

## 2018-05-19 VITALS — BP 97/62 | HR 99 | Temp 98.0°F | Resp 18 | Wt 102.3 lb

## 2018-05-19 DIAGNOSIS — R42 Dizziness and giddiness: Secondary | ICD-10-CM | POA: Insufficient documentation

## 2018-05-19 DIAGNOSIS — R51 Headache: Secondary | ICD-10-CM | POA: Insufficient documentation

## 2018-05-19 DIAGNOSIS — C7931 Secondary malignant neoplasm of brain: Secondary | ICD-10-CM | POA: Diagnosis not present

## 2018-05-19 DIAGNOSIS — Z7951 Long term (current) use of inhaled steroids: Secondary | ICD-10-CM | POA: Diagnosis not present

## 2018-05-19 DIAGNOSIS — R1013 Epigastric pain: Secondary | ICD-10-CM | POA: Insufficient documentation

## 2018-05-19 DIAGNOSIS — H539 Unspecified visual disturbance: Secondary | ICD-10-CM

## 2018-05-19 DIAGNOSIS — M549 Dorsalgia, unspecified: Secondary | ICD-10-CM | POA: Diagnosis not present

## 2018-05-19 DIAGNOSIS — R630 Anorexia: Secondary | ICD-10-CM | POA: Insufficient documentation

## 2018-05-19 DIAGNOSIS — R200 Anesthesia of skin: Secondary | ICD-10-CM

## 2018-05-19 DIAGNOSIS — R5383 Other fatigue: Secondary | ICD-10-CM | POA: Diagnosis not present

## 2018-05-19 DIAGNOSIS — G4089 Other seizures: Secondary | ICD-10-CM | POA: Diagnosis not present

## 2018-05-19 DIAGNOSIS — C7972 Secondary malignant neoplasm of left adrenal gland: Secondary | ICD-10-CM | POA: Diagnosis not present

## 2018-05-19 DIAGNOSIS — C7951 Secondary malignant neoplasm of bone: Secondary | ICD-10-CM

## 2018-05-19 DIAGNOSIS — C342 Malignant neoplasm of middle lobe, bronchus or lung: Secondary | ICD-10-CM

## 2018-05-19 DIAGNOSIS — R112 Nausea with vomiting, unspecified: Secondary | ICD-10-CM | POA: Diagnosis not present

## 2018-05-19 DIAGNOSIS — Z7901 Long term (current) use of anticoagulants: Secondary | ICD-10-CM | POA: Diagnosis not present

## 2018-05-19 DIAGNOSIS — Z79899 Other long term (current) drug therapy: Secondary | ICD-10-CM | POA: Diagnosis not present

## 2018-05-19 MED ORDER — LEVETIRACETAM 500 MG PO TABS: 1000.0000 mg | ORAL_TABLET | Freq: Two times a day (BID) | ORAL | 0 refills | Status: DC

## 2018-05-19 MED FILL — levETIRAcetam 500 MG TABS: 500 | 14 days supply | Qty: 56 | Fill #0

## 2018-05-19 NOTE — Telephone Encounter (Signed)
Scheduled appt per 5/4 los.

## 2018-05-19 NOTE — Progress Notes (Signed)
[facility] [place], [place] [address] [phone]   New Patient Evaluation  Date of Service: 05/19/18 Patient Name: Deborah Foster Patient MRN: [id number] Patient DOB: January 13, 1971 Provider: Ventura Sellers, MD  Identifying Statement:  Deborah Foster is a 48 y.o. female with Metastasis to brain [facility]) [C79.31] who presents for initial consultation and evaluation regarding cancer associated neurologic deficits.    Referring Provider: [name], MD [place], [place] [address]  Primary Cancer: Lung adeno w/ EGFR mutation  CNS Oncologic History: 08/23/14-09/07/14:  WBRT 30 Gy in 12 fractions  History of Present Illness: The patient's records from the referring physician were obtained and reviewed and the patient interviewed to confirm this HPI.  Deborah Foster presents today to review clinical changes and recent MRI brain.  Deborah Foster describes onset of visual changes, described primarily as "episodes of flashing lights".  These episodes occur roughly daily, and tend to last for ~5 minutes before resolving spontaneously.  First episode occurred roughly two weeks ago, very soon after starting crizotinib for progressive EGFR mutant lung adenocarcinoma.  Otherwise vision is not clearly disturbed, maintains full functional independence.  Deborah Foster continues on Tagrisso as well.  Deborah Foster also describes intermittent medial arm numbness, described as "pins and needles" that usually is only symptomatic in the evening.  This has been stable over the past 1 year.    Medications: Current Outpatient Medications on File Prior to Visit  Medication Sig Dispense Refill   benzonatate (TESSALON) 200 MG capsule TK ONE C PO Q 8 H PRN COU  0   Calcium Carbonate-Vitamin D (CALCIUM 500 + D) 500-125 MG-UNIT TABS Take 500 mg by mouth 2 (two) times daily.     crizotinib (XALKORI) 250 MG capsule Take 1 capsule (250 mg total) by mouth 2 (two) times daily. 60  capsule 2   Cyanocobalamin (VITAMIN B-12) 2500 MCG SUBL Place 2,500 mcg under the tongue daily.     Dextromethorphan Polistirex (DELSYM PO) Take by mouth.     ENSURE (ENSURE) Take 1 Can by mouth 2 (two) times daily between meals.      ferrous sulfate 325 (65 FE) MG EC tablet Take 325 mg by mouth daily with breakfast.     folic acid (FOLVITE) 361 MCG tablet Take 1 tablet (800 mcg total) by mouth daily. 30 tablet 1   loratadine (CLARITIN) 10 MG tablet Take 1 tablet (10 mg total) by mouth daily. 30 tablet 0   mirtazapine (REMERON) 15 MG tablet Take 1 tablet (15 mg total) by mouth at bedtime. 30 tablet 0   Nutritional Supplements (JUICE PLUS FIBRE PO) Take 1 capsule by mouth daily. Reported on 07/28/2015     omeprazole (PRILOSEC) 20 MG capsule Take 20 mg by mouth daily.     ondansetron (ZOFRAN) 8 MG tablet Take 1 tablet (8 mg total) by mouth every 8 (eight) hours as needed for nausea or vomiting. 20 tablet 0   osimertinib mesylate (TAGRISSO) 80 MG tablet Take 1 tablet (80 mg total) by mouth daily. 30 tablet 2   OVER THE COUNTER MEDICATION "Deep penetrating pain relief oil"     prochlorperazine (COMPAZINE) 10 MG tablet TAKE 1 TABLET BY MOUTH EVERY 6 HOURS AS NEEDED FOR NAUSEA OR VOMITING. 30 tablet 0   rivaroxaban (XARELTO) 20 MG TABS tablet Take 1 tablet (20 mg total) by mouth daily with supper. 30 tablet 1   TURMERIC PO Take 1 capsule  by mouth 2 (two) times daily. Turmeric Superior 750 mg po bid     AMBULATORY NON FORMULARY MEDICATION Medication Name: MAGIC MOUTHWASH 2 % Viscous Lidocaine  Maalox Benadryl Susp.  Disp: 1:1:1 229m bottle Sig: 144mPO Swish & Spit  q 3-4hrs. PRN mouth sores (Patient not taking: Reported on 07/29/2017) 200 mL 3   chlorpheniramine-HYDROcodone (TUSSIONEX) 10-8 MG/5ML SUER Take 5 mLs by mouth every 12 (twelve) hours as needed. (Patient not taking: Reported on 04/08/2018) 140 mL 0   clindamycin (CLINDAGEL) 1 % gel Apply 1 application topically 2 (two)  times daily.     clobetasol cream (TEMOVATE) 0.9.50 Apply 1 application topically 2 (two) times daily.     fluticasone (VERAMYST) 27.5 MCG/SPRAY nasal spray Place 2 sprays into the nose daily.     ranitidine (ZANTAC) 300 MG tablet Take 1 tablet (300 mg total) by mouth at bedtime. (Patient not taking: Reported on 05/19/2018) 30 tablet 6   No current facility-administered medications on file prior to visit.     Allergies:  Allergies  Allergen Reactions   Other Other (See Comments)    Patient states that there is some Deborah Foster takes that makes her throat "cry" AND SCRATCHY    Doxycycline Nausea And Vomiting   Past Medical History:  Past Medical History:  Diagnosis Date   Bone metastasis ([facility]   Hemoptysis    Hypokalemia    lung ca dx'd 07/2014   Lung mass    Metastasis to adrenal gland (HCC)    Metastasis to brain ([facility]   Pneumonia    Radiation 08/23/14-09/07/14   Brain/chest and left hip 30 Gy 12 Fx   URI (upper respiratory infection) 02-02-2015   Past Surgical History:  Past Surgical History:  Procedure Laterality Date   IR GENERIC HISTORICAL  07/14/2015   IR RADIOLOGIST EVAL & MGMT 07/14/2015 MiGreggory KeenMD [facility] INTERV RAD   VIDEO BRONCHOSCOPY Bilateral 08/09/2014   Procedure: VIDEO BRONCHOSCOPY WITHOUT FLUORO;  Surgeon: DoJuanito DoomMD;  [facility];  Service: Cardiopulmonary;  Laterality: Bilateral;   VIDEO BRONCHOSCOPY Bilateral 07/06/2015   Procedure: VIDEO BRONCHOSCOPY WITHOUT FLUORO;  Surgeon: DoJuanito DoomMD;  Location: [facility]COPY;  Service: Cardiopulmonary;  Laterality: Bilateral;   Social History:  Social History   Socioeconomic History   Marital status: Married    Spouse name: Not on file   Number of children: 3   Years of education: Not on file   Highest education level: Not on file  Occupational History   Occupation: unemployed  Social NeDesigner, fashion/clothingtrain: Not on file   Food insecurity:    Worry:  Not on file    Inability: Not on file   Transportation needs:    Medical: Not on file    Non-medical: Not on file  Tobacco Use   Smoking status: Never Smoker   Smokeless tobacco: Never Used  Substance and Sexual Activity   Alcohol use: No    Alcohol/week: 0.0 standard drinks   Drug use: No   Sexual activity: Not Currently  Lifestyle   Physical activity:    Days per week: Not on file    Minutes per session: Not on file   Stress: Not on file  Relationships   Social connections:    Talks on phone: Not on file    Gets together: Not on file    Attends religious service: Not on file    Active member of club or organization: Not  on file    Attends meetings of clubs or organizations: Not on file    Relationship status: Not on file   Intimate partner violence:    Fear of current or ex partner: Not on file    Emotionally abused: Not on file    Physically abused: Not on file    Forced sexual activity: Not on file  Other Topics Concern   Not on file  Social History Narrative   Patient moved to [place] in [place].   Had been in [place] since that time.   Family History:  Family History  Problem Relation Age of Onset   Hyperlipidemia Mother     Review of Systems: Constitutional: Denies fevers, chills or abnormal weight loss Eyes: Denies blurriness of vision Ears, nose, mouth, throat, and face: Denies mucositis or sore throat Respiratory: Denies cough, dyspnea or wheezes Cardiovascular: Denies palpitation, chest discomfort or lower extremity swelling Gastrointestinal:  Denies nausea, constipation, diarrhea GU: Denies dysuria or incontinence Skin: Denies abnormal skin rashes Neurological: Per HPI Musculoskeletal: Denies joint pain, back or neck discomfort. No decrease in ROM Behavioral/Psych: Denies anxiety, disturbance in thought content, and mood instability   Physical Exam: Vitals:   05/19/18 0956  BP: 97/62  Pulse: 99  Resp: 18  Temp: 98 F  (36.7 C)  SpO2: 100%   KPS: 90. General: Alert, cooperative, pleasant, in no acute distress Head: Normal EENT: No conjunctival injection or scleral icterus. Oral mucosa moist Lungs: Resp effort normal Cardiac: Regular rate and rhythm Abdomen: Soft, non-distended abdomen Skin: No rashes cyanosis or petechiae. Extremities: No clubbing or edema  Neurologic Exam: Mental Status: Awake, alert, attentive to examiner. Oriented to self and environment. Language is fluent with intact comprehension.  Cranial Nerves: Visual acuity is grossly normal. Visual fields are full. Extra-ocular movements intact. No ptosis. Face is symmetric, tongue midline. Motor: Tone and bulk are normal. Power is full in both arms and legs. Reflexes are symmetric, no pathologic reflexes present. Intact finger to nose bilaterally Sensory: Normal Gait: Normal and tandem gait is normal.   Labs: I have reviewed the data as listed    Component Value Date/Time   NA 137 05/05/2018 0936   NA 140 01/17/2017 1003   K 4.2 05/05/2018 0936   K 3.9 01/17/2017 1003   CL 101 05/05/2018 0936   CO2 27 05/05/2018 0936   CO2 29 01/17/2017 1003   GLUCOSE 91 05/05/2018 0936   GLUCOSE 69 (L) 01/17/2017 1003   BUN 11 05/05/2018 0936   BUN 14.8 01/17/2017 1003   CREATININE 0.73 05/05/2018 0936   CREATININE 0.8 01/17/2017 1003   CALCIUM 8.4 (L) 05/05/2018 0936   CALCIUM 8.7 01/17/2017 1003   PROT 7.2 05/05/2018 0936   PROT 6.4 01/17/2017 1003   ALBUMIN 3.8 05/05/2018 0936   ALBUMIN 3.6 01/17/2017 1003   AST 25 05/05/2018 0936   AST 23 01/17/2017 1003   ALT 38 05/05/2018 0936   ALT 27 01/17/2017 1003   ALKPHOS 58 05/05/2018 0936   ALKPHOS 50 01/17/2017 1003   BILITOT 0.4 05/05/2018 0936   BILITOT 0.46 01/17/2017 1003   GFRNONAA >60 05/05/2018 0936   GFRAA >60 05/05/2018 0936   Lab Results  Component Value Date   WBC 3.4 (L) 05/05/2018   NEUTROABS 2.1 05/05/2018   HGB 13.1 05/05/2018   HCT 39.4 05/05/2018   MCV 95.4  05/05/2018   PLT 178 05/05/2018    Imaging:  Mr Jeri Cos GB Contrast  Result Date: 05/13/2018  CLINICAL DATA:  SRS protocol. Primary lung cancer with metastatic disease. Frequent headaches. Blurred vision. EXAM: MRI HEAD WITHOUT AND WITH CONTRAST TECHNIQUE: Multiplanar, multiecho pulse sequences of the brain and surrounding structures were obtained without and with intravenous contrast. CONTRAST:  5m MULTIHANCE GADOBENATE DIMEGLUMINE 529 MG/ML IV SOLN COMPARISON:  01/24/2018. FINDINGS: Brain: Interval stability of LEFT frontal metastasis, RIGHT frontal parasagittal metastasis, RIGHT parietal parasagittal metastasis, and RIGHT posterior temporal metastasis. Apparent worsening of LEFT posterior temporal metastasis, stable in size (18 mm) but increasing vasogenic edema, as well as apparent worsening LEFT cerebellar metastasis, slight increase size (7 mm) and increasing vasogenic edema. Numerous tiny microbleeds on gradient sequence, probably stable. No new enhancing lesions are seen. Premature for age atrophy.  Minor white matter disease. Vascular: Flow voids are maintained. Skull and upper cervical spine: Normal marrow signal. Sinuses/Orbits: Negative. Other: None. IMPRESSION: Worsening vasogenic edema surrounding the LEFT temporal metastasis. Increase in size and edema surrounding LEFT cerebellar metastasis. Other treated metastases are stable. Electronically Signed   By: JStaci RighterM.D.   On: 05/13/2018 13:30    CRouttClinician Interpretation: I have personally reviewed the radiological images as listed.  My interpretation, in the context of the patient's clinical presentation, is likely treatment effect   Assessment/Plan 1. Metastasis to brain ([facility]  Ms. LBrownsteinpresents today with visual episodes which may be consistent with focal seizures.  The stereotypy and semiology are consistent with epileptic etiology, although lack of localization to visual field (here we would expect right hemi-field) is  inconsistent.   Deborah Foster does have two progressive regions in the CNS:   -Left temporal/parietal region demonstrates increased edema/inflammation without change in character of enhancing volume.  This lesion, or changes within it, could theoretically cause visual type seizures -Left cerebellar mass is slightly more pronounced with regards to enhancement and T2 surround.  This would not cause symptoms described by the patient.  We recommended a trial of an anti-epileptic.  We ordered Keppra, '1000mg'$  BID, to be taken starting this week until return to clinic in 2 weeks.  We asked the patient to carefully record visual episodes during therapy.  If decreased, this builds stronger case for epileptic etiology.  If not impacted, we will likely try a short course of dexamethasone and referral to ophthalmology.  CNS changes described can be monitored further with repeat MRI brain in 3 months.  They may represent inflammatory response to crizotinib vs very delayed radiation inflammatory response vs actually tumor progression (less likely).  We spent twenty additional minutes teaching regarding the natural history, biology, and historical experience in the treatment of neurologic complications of cancer. We also provided teaching sheets for the patient to take home as an additional resource.  We appreciate the opportunity to participate in the care of Deborah Foster.  Deborah Foster will return to clinic in 2 weeks for further evaluation of visual events.    All questions were answered. The patient knows to call the clinic with any problems, questions or concerns. No barriers to learning were detected.  The total time spent in the encounter was 40 minutes and more than 50% was on counseling and review of test results   ZVentura Sellers MD Medical Director of Neuro-Oncology [facility]at WUnion05/04/20 3:43 PM

## 2018-05-26 ENCOUNTER — Other Ambulatory Visit: Payer: Self-pay

## 2018-05-26 ENCOUNTER — Inpatient Hospital Stay (HOSPITAL_BASED_OUTPATIENT_CLINIC_OR_DEPARTMENT_OTHER): Payer: Medicare Other | Admitting: Physician Assistant

## 2018-05-26 ENCOUNTER — Other Ambulatory Visit: Payer: Self-pay | Admitting: Internal Medicine

## 2018-05-26 ENCOUNTER — Inpatient Hospital Stay: Payer: Medicare Other

## 2018-05-26 VITALS — BP 104/57 | HR 102 | Temp 98.0°F | Resp 16 | Ht 59.0 in | Wt 103.2 lb

## 2018-05-26 DIAGNOSIS — C7931 Secondary malignant neoplasm of brain: Secondary | ICD-10-CM

## 2018-05-26 DIAGNOSIS — C3491 Malignant neoplasm of unspecified part of right bronchus or lung: Secondary | ICD-10-CM

## 2018-05-26 DIAGNOSIS — Z5111 Encounter for antineoplastic chemotherapy: Secondary | ICD-10-CM

## 2018-05-26 DIAGNOSIS — C7951 Secondary malignant neoplasm of bone: Secondary | ICD-10-CM

## 2018-05-26 DIAGNOSIS — C7972 Secondary malignant neoplasm of left adrenal gland: Secondary | ICD-10-CM

## 2018-05-26 DIAGNOSIS — C342 Malignant neoplasm of middle lobe, bronchus or lung: Secondary | ICD-10-CM

## 2018-05-26 DIAGNOSIS — R1013 Epigastric pain: Secondary | ICD-10-CM

## 2018-05-26 DIAGNOSIS — R51 Headache: Secondary | ICD-10-CM

## 2018-05-26 DIAGNOSIS — M549 Dorsalgia, unspecified: Secondary | ICD-10-CM

## 2018-05-26 DIAGNOSIS — H539 Unspecified visual disturbance: Secondary | ICD-10-CM

## 2018-05-26 DIAGNOSIS — Z79899 Other long term (current) drug therapy: Secondary | ICD-10-CM

## 2018-05-26 DIAGNOSIS — Z7951 Long term (current) use of inhaled steroids: Secondary | ICD-10-CM

## 2018-05-26 DIAGNOSIS — R112 Nausea with vomiting, unspecified: Secondary | ICD-10-CM

## 2018-05-26 DIAGNOSIS — Z7901 Long term (current) use of anticoagulants: Secondary | ICD-10-CM

## 2018-05-26 LAB — CBC WITH DIFFERENTIAL (CANCER CENTER ONLY)
Abs Immature Granulocytes: 0.01 10*3/uL (ref 0.00–0.07)
Basophils Absolute: 0 10*3/uL (ref 0.0–0.1)
Basophils Relative: 1 %
Eosinophils Absolute: 0.2 10*3/uL (ref 0.0–0.5)
Eosinophils Relative: 6 %
HCT: 38 % (ref 36.0–46.0)
Hemoglobin: 12.2 g/dL (ref 12.0–15.0)
Immature Granulocytes: 0 %
Lymphocytes Relative: 12 %
Lymphs Abs: 0.4 10*3/uL — ABNORMAL LOW (ref 0.7–4.0)
MCH: 31.4 pg (ref 26.0–34.0)
MCHC: 32.1 g/dL (ref 30.0–36.0)
MCV: 97.7 fL (ref 80.0–100.0)
Monocytes Absolute: 0.6 10*3/uL (ref 0.1–1.0)
Monocytes Relative: 17 %
Neutro Abs: 2.3 10*3/uL (ref 1.7–7.7)
Neutrophils Relative %: 64 %
Platelet Count: 277 10*3/uL (ref 150–400)
RBC: 3.89 MIL/uL (ref 3.87–5.11)
RDW: 13.7 % (ref 11.5–15.5)
WBC Count: 3.6 10*3/uL — ABNORMAL LOW (ref 4.0–10.5)
nRBC: 0 % (ref 0.0–0.2)

## 2018-05-26 LAB — CMP (CANCER CENTER ONLY)
ALT: 33 U/L (ref 0–44)
AST: 28 U/L (ref 15–41)
Albumin: 2.9 g/dL — ABNORMAL LOW (ref 3.5–5.0)
Alkaline Phosphatase: 71 U/L (ref 38–126)
Anion gap: 9 (ref 5–15)
BUN: 9 mg/dL (ref 6–20)
CO2: 27 mmol/L (ref 22–32)
Calcium: 7.8 mg/dL — ABNORMAL LOW (ref 8.9–10.3)
Chloride: 105 mmol/L (ref 98–111)
Creatinine: 0.71 mg/dL (ref 0.44–1.00)
GFR, Est AFR Am: >60 mL/min (ref >60)
GFR, Estimated: >60 mL/min (ref >60)
Glucose, Bld: 88 mg/dL (ref 70–99)
Potassium: 3.5 mmol/L (ref 3.5–5.1)
Sodium: 141 mmol/L (ref 135–145)
Total Bilirubin: 0.2 mg/dL — ABNORMAL LOW (ref 0.3–1.2)
Total Protein: 6 g/dL — ABNORMAL LOW (ref 6.5–8.1)

## 2018-05-26 LAB — MAGNESIUM: Magnesium: 2 mg/dL (ref 1.7–2.4)

## 2018-05-26 LAB — TSH: TSH: 0.551 u[IU]/mL (ref 0.308–3.960)

## 2018-05-26 MED ORDER — DENOSUMAB 120 MG/1.7ML ~~LOC~~ SOLN
SUBCUTANEOUS | Status: AC
  Filled 2018-05-26: qty 1.7

## 2018-05-26 MED ORDER — ONDANSETRON HCL 8 MG PO TABS: 8.0000 mg | ORAL_TABLET | Freq: Three times a day (TID) | ORAL | 0 refills | Status: DC | PRN

## 2018-05-26 MED FILL — ONDANSETRON HCL 8 MG TABLET: 8 | 10 days supply | Qty: 30 | Fill #0

## 2018-05-26 NOTE — Progress Notes (Signed)
Pt is due to get xgeva injection today. Her calcium is 7.8 today. Pt not receiving xgeva per Dr. Earlie Server

## 2018-05-26 NOTE — Progress Notes (Signed)
[facility] OFFICE PROGRESS NOTE  [name], MD [place] [id number]  DIAGNOSIS: Stage IV (T2a, N1, M1b) non-small cell lung cancer, adenocarcinoma with positive EGFR mutation (deletion 45) presented with right middle lobe lung mass, right hilar adenopathy as well as metastatic disease to the bone, brain and left adrenal diagnosed in July 2016.  PRIOR THERAPY:  1) palliative radiotherapy to the brain as well as metastatic bone lesions in the pelvis. 2) Tarceva 150 mg by mouth daily started on 09/25/2014. Status post 5 months of treatment. 3) status post palliative radiotherapy to the right middle lobe lung mass for treatment of hemoptysis. 4) Tarceva 100 mg by mouth daily started 02/19/2015 status post 13 months of treatment. This was discontinued in May 2018 after the patient had disease progression and development of T790M resistant mutation.  5) palliative stereotactic radiotherapy to the left adrenal gland metastatic lesion under the care of Dr. Lisbeth Renshaw completed on April 10, 2017 6) Pallative sterotactic radiotherapy to the left supraclavicular and retroperitoneal lymphadenpathy under the care of Dr. Lisbeth Renshaw completed in April 2020.   CURRENT THERAPY:  1) Tagrisso 80 mg by mouth daily started 06/02/2016. Status post 73month of treatment. 2) Xgeva 120 g subcutaneously every 6 weeks. 3) Xalkori 250 mg by mouth BID started 05/08/2018.   INTERVAL HISTORY: [name]48y.o. female returns to the clinic for a follow-up visit.  The patient is feeling fair today.  She recently started treatment with Xalkori 250 mg by mouth twice daily. She has tolerated her first few weeks of treatment fairly well except for 2 episodes of diarrhea, which resolved on their own without intervention. However, she continues to endorse visual disturbances with "floaters" in her visual field. This has been present for the last several months even prior to starting Xalkori  on 4/23. She had reported these visual symptoms at her last visit as well on 05/05/2018. She recently had a brain MRI performed due to her visual symptoms and headaches. She followed closely by Dr. VMickeal Skinnerand radiation oncology for her history of metastatic disease to the brain. She saw Dr. VMickeal Skinneron 05/19/2018 who started her on a trial with Keppra to see if these symptoms represent focal seizures.  She has follow-up appointment with him on Jun 02, 2018. She states her symptoms have improved since starting Keppra; however, she still experiences these visual disturbances, particularly in the morning.   The patient also raises concern for back pain and abdominal pain in the lower quadrants. She states this started about 2 weeks ago. For the back pain, the patient denies any injuries or traumas. She localizes her pain the the posterior aspect of her left rib cage. She characterizes her pain as a sharp, non-radiating pain. The pain is present in the morning and lasts for 10 minutes before resolving without any intervention. The pain is not exacerbated by any particular triggers including movement. The epigastric pain also started about 2 weeks ago. She has associated nausea with 1 episode of vomiting. She takes Zofran for nausea which helps. She denies any constipation.   The patient denies any other symptoms including fever, chills, night sweats, or anymore weight loss. She denies any chest pain, shortness of breath, cough, or hemoptysis. She denies any rashes or skin changes. She here today for evaluation and routine blood work.  MEDICAL HISTORY: Past Medical History:  Diagnosis Date  . [place]   . Hemoptysis   . Hypokalemia   .  lung ca dx'd 07/2014  . Lung mass   . Metastasis to adrenal gland ([facility])   . Metastasis to brain ([facility])   . Pneumonia   . Radiation 08/23/14-09/07/14   Brain/chest and left hip 30 Gy 12 Fx  . URI (upper respiratory infection) 02-02-2015    ALLERGIES:  is allergic to  other and doxycycline.  MEDICATIONS:  Current Outpatient Medications  Medication Sig Dispense Refill  . benzonatate (TESSALON) 200 MG capsule TK ONE C PO Q 8 H PRN COU  0  . Calcium Carbonate-Vitamin D (CALCIUM 500 + D) 500-125 MG-UNIT TABS Take 500 mg by mouth 2 (two) times daily.    . clindamycin (CLINDAGEL) 1 % gel Apply 1 application topically 2 (two) times daily.    . clobetasol cream (TEMOVATE) 0.05 % Apply 1 application topically 2 (two) times daily.    . crizotinib (XALKORI) 250 MG capsule Take 1 capsule (250 mg total) by mouth 2 (two) times daily. 60 capsule 2  . Cyanocobalamin (VITAMIN B-12) 2500 MCG SUBL Place 2,500 mcg under the tongue daily.    . Dextromethorphan Polistirex (DELSYM PO) Take by mouth.    . ENSURE (ENSURE) Take 1 Can by mouth 2 (two) times daily between meals.     . ferrous sulfate 325 (65 FE) MG EC tablet Take 325 mg by mouth daily with breakfast.    . fluticasone (VERAMYST) 27.5 MCG/SPRAY nasal spray Place 2 sprays into the nose daily.    . folic acid (FOLVITE) 800 MCG tablet Take 1 tablet (800 mcg total) by mouth daily. 30 tablet 1  . levETIRAcetam (KEPPRA) 500 MG tablet Take 2 tablets (1,000 mg total) by mouth 2 (two) times daily for 14 days. 56 tablet 0  . loratadine (CLARITIN) 10 MG tablet Take 1 tablet (10 mg total) by mouth daily. 30 tablet 0  . mirtazapine (REMERON) 15 MG tablet Take 1 tablet (15 mg total) by mouth at bedtime. 30 tablet 0  . Nutritional Supplements (JUICE PLUS FIBRE PO) Take 1 capsule by mouth daily. Reported on 07/28/2015    . omeprazole (PRILOSEC) 20 MG capsule Take 20 mg by mouth daily.    . ondansetron (ZOFRAN) 8 MG tablet Take 1 tablet (8 mg total) by mouth every 8 (eight) hours as needed for nausea or vomiting. 30 tablet 0  . OVER THE COUNTER MEDICATION "Deep penetrating pain relief oil"    . prochlorperazine (COMPAZINE) 10 MG tablet TAKE 1 TABLET BY MOUTH EVERY 6 HOURS AS NEEDED FOR NAUSEA OR VOMITING. 30 tablet 0  . rivaroxaban  (XARELTO) 20 MG TABS tablet Take 1 tablet (20 mg total) by mouth daily with supper. 30 tablet 1  . TURMERIC PO Take 1 capsule by mouth 2 (two) times daily. Turmeric Superior 750 mg po bid    . AMBULATORY NON FORMULARY MEDICATION Medication Name: MAGIC MOUTHWASH 2 % Viscous Lidocaine  Maalox Benadryl Susp.  Disp: 1:1:1 200ml bottle Sig: 15ml PO Swish & Spit  q 3-4hrs. PRN mouth sores (Patient not taking: Reported on 07/29/2017) 200 mL 3  . chlorpheniramine-HYDROcodone (TUSSIONEX) 10-8 MG/5ML SUER Take 5 mLs by mouth every 12 (twelve) hours as needed. (Patient not taking: Reported on 04/08/2018) 140 mL 0  . ranitidine (ZANTAC) 300 MG tablet Take 1 tablet (300 mg total) by mouth at bedtime. (Patient not taking: Reported on 05/19/2018) 30 tablet 6  . TAGRISSO 80 MG tablet TAKE 1 TABLET (80 MG TOTAL) BY MOUTH DAILY. 30 tablet 11   No current facility-administered medications   for this visit.     SURGICAL HISTORY:  Past Surgical History:  Procedure Laterality Date  . IR GENERIC HISTORICAL  07/14/2015   IR RADIOLOGIST EVAL & MGMT 07/14/2015 Michael Shick, MD [facility] INTERV RAD  . VIDEO BRONCHOSCOPY Bilateral 08/09/2014   Procedure: VIDEO BRONCHOSCOPY WITHOUT FLUORO;  Surgeon: Douglas B McQuaid, MD;  Location: [facility];  Service: Cardiopulmonary;  Laterality: Bilateral;  . VIDEO BRONCHOSCOPY Bilateral 07/06/2015   Procedure: VIDEO BRONCHOSCOPY WITHOUT FLUORO;  Surgeon: Douglas B McQuaid, MD;  Location: [facility];  Service: Cardiopulmonary;  Laterality: Bilateral;    REVIEW OF SYSTEMS:   Review of Systems  Constitutional: Negative for appetite change, chills, fatigue, fever and unexpected weight change.  HENT:   Negative for mouth sores, nosebleeds, sore throat and trouble swallowing.   Eyes: Positive for visual field disturbances with "floaters". Negative for eye problems and icterus.  Respiratory: Negative for cough, hemoptysis, shortness of breath and wheezing.   Cardiovascular: Negative for  chest pain and leg swelling.  Gastrointestinal: Positive for bilateral lower quadrant pain, nausea, and one episode of diarrhea and vomiting. Negative for constipation Genitourinary: Negative for bladder incontinence, difficulty urinating, dysuria, frequency and hematuria.   Musculoskeletal: Positive for left posterior rib pain. Negative for back pain, gait problem, neck pain and neck stiffness.  Skin: Negative for itching and rash.  Neurological: Negative for dizziness, extremity weakness, gait problem, headaches, light-headedness and seizures.  Hematological: Negative for adenopathy. Does not bruise/bleed easily.  Psychiatric/Behavioral: Negative for confusion, depression and sleep disturbance. The patient is not nervous/anxious.     PHYSICAL EXAMINATION:  Blood pressure (!) 104/57, pulse (!) 102, temperature 98 F (36.7 C), temperature source Oral, resp. rate 16, height 4' 11" (1.499 m), weight 103 lb 3.2 oz (46.8 kg), SpO2 100 %.  ECOG PERFORMANCE STATUS: 1 - Symptomatic but completely ambulatory  Physical Exam  Constitutional: Oriented to person, place, and time and well-developed, well-nourished, and in no distress.  HENT:  Head: Normocephalic and atraumatic.  Mouth/Throat: Oropharynx is clear and moist. No oropharyngeal exudate.  Eyes: Conjunctivae are normal. Right eye exhibits no discharge. Left eye exhibits no discharge. No scleral icterus.  Neck: Normal range of motion. Neck supple.  Cardiovascular: Normal rate, regular rhythm, normal heart sounds and intact distal pulses.   Pulmonary/Chest: Decreased breath sounds on right middle/lower lobe. Effort normal and breath sounds normal in all other lung fields. No respiratory distress. No wheezes. No rales.  Abdominal: Soft. Bowel sounds are normal. Exhibits no distension and no mass. There is no tenderness.  Musculoskeletal: Normal range of motion. Exhibits no edema.  Lymphadenopathy:    No cervical adenopathy.  Neurological:  Alert and oriented to person, place, and time. Exhibits normal muscle tone. Gait normal. Coordination normal.  Skin: Skin is warm and dry. No rash noted. Not diaphoretic. No erythema. No pallor.  Psychiatric: Mood, memory and judgment normal.  Vitals reviewed.  LABORATORY DATA: Lab Results  Component Value Date   WBC 3.6 (L) 05/26/2018   HGB 12.2 05/26/2018   HCT 38.0 05/26/2018   MCV 97.7 05/26/2018   PLT 277 05/26/2018      Chemistry      Component Value Date/Time   NA 141 05/26/2018 0831   NA 140 01/17/2017 1003   K 3.5 05/26/2018 0831   K 3.9 01/17/2017 1003   CL 105 05/26/2018 0831   CO2 27 05/26/2018 0831   CO2 29 01/17/2017 1003   BUN 9 05/26/2018 0831   BUN 14.8 01/17/2017 1003     CREATININE 0.71 05/26/2018 0831   CREATININE 0.8 01/17/2017 1003      Component Value Date/Time   CALCIUM 7.8 (L) 05/26/2018 0831   CALCIUM 8.7 01/17/2017 1003   ALKPHOS 71 05/26/2018 0831   ALKPHOS 50 01/17/2017 1003   AST 28 05/26/2018 0831   AST 23 01/17/2017 1003   ALT 33 05/26/2018 0831   ALT 27 01/17/2017 1003   BILITOT 0.2 (L) 05/26/2018 0831   BILITOT 0.46 01/17/2017 1003       RADIOGRAPHIC STUDIES:  Mr Jeri Cos TC Contrast  Result Date: 05/13/2018 CLINICAL DATA:  [facility] protocol. Primary lung cancer with metastatic disease. Frequent headaches. Blurred vision. EXAM: MRI HEAD WITHOUT AND WITH CONTRAST TECHNIQUE: Multiplanar, multiecho pulse sequences of the brain and surrounding structures were obtained without and with intravenous contrast. CONTRAST:  43m MULTIHANCE GADOBENATE DIMEGLUMINE 529 MG/ML IV SOLN COMPARISON:  01/24/2018. FINDINGS: Brain: Interval stability of LEFT frontal metastasis, RIGHT frontal parasagittal metastasis, RIGHT parietal parasagittal metastasis, and RIGHT posterior temporal metastasis. Apparent worsening of LEFT posterior temporal metastasis, stable in size (18 mm) but increasing vasogenic edema, as well as apparent worsening LEFT cerebellar metastasis,  slight increase size (7 mm) and increasing vasogenic edema. Numerous tiny microbleeds on gradient sequence, probably stable. No new enhancing lesions are seen. Premature for age atrophy.  Minor white matter disease. Vascular: Flow voids are maintained. Skull and upper cervical spine: Normal marrow signal. Sinuses/Orbits: Negative. Other: None. IMPRESSION: Worsening vasogenic edema surrounding the LEFT temporal metastasis. Increase in size and edema surrounding LEFT cerebellar metastasis. Other treated metastases are stable. Electronically Signed   By: JStaci RighterM.D.   On: 05/13/2018 13:30   EKG: Normal sinus rhythm. QT: 364 QTc: 467  ASSESSMENT/PLAN:  This is a very pleasant 48year old Asian female with stage IV non-small cell lung cancer, adenocarcinoma with a positive EGFR mutation with deletion in exon 19.  She was diagnosed in July 2016.  She is status post 18 months of treatment with Tarceva. This was discontinued due to disease progression and the development of T790M resistant mutation. The patient was then started on treatment with Tagrisso 80 mg by mouth daily status post 282monthof treatment.  She is tolerating treatment well.   A recent CT scan performed in March 2020 showed evidence of disease progression with enlarging retroperitoneal and left supraclavicular lymphadenopathy.  She recently completed palliative radiotherapy to the enlarging lymph nodes. The patient's recent biopsy of the left supraclavicular lymph node showed metastatic adenocarcinoma.  She recently had molecular studies by foundation 1 performed which showed was positive for a MET amplification mutation, in addition to her known EGFR mutation.   The patient is currently undergoing treatment with Xalkori 250 mg twice daily starting on May 08, 2018 in addition to Tagrisso 80 mg p.o. daily.   I will arrange for the patient to have a EKG performed while in the clinic today since both chemotherapy drugs have the ability to  prolong the QT interval.    The patient was seen with Dr. MoJulien Nordmannoday. Labs were reviewed. We recommend she continue treatment with Xalkori and Tagrisso at the same doses. She will not receive her Xgeva injection today due to her hypocalcemia. I will arrange for her to receive her injection at her next appointment.   We will arrange for a restaging CT scan to be performed of her chest, abdomen, and pelvis prior to her next visit in 1 month. The scan will also allow us[place]oreao assess if there is any  concerning findings in the abdomen/posterior left rib where the patient recently started feeling intermittent pain.   We will see her back for a follow up visit in 1 month for evaluation and to review her scan results.   For the visual symptoms and headache, the patient had been experiencing visual disturbances prior to starting Xalkori, however, due to the possible drug side effect of visual disturbances, we cannot exclude that her Hulda Humphrey is also contributing to her visual disturbances. She will continue taking Keppra and the patient will follow up with Dr. Mickeal Skinner on 06/02/2018.   I have sent a refill for Zofran to the patient's pharmacy. The patient was encouraged to use imodium if she develops worsening diarrhea in the future, which is available over the counter.  The patient was advised to call immediately if she has any concerning symptoms in the interval. The patient voices understanding of current disease status and treatment options and is in agreement with the current care plan. All questions were answered. The patient knows to call the clinic with any problems, questions or concerns. We can certainly see the patient much sooner if necessary  Orders Placed This Encounter  Procedures  . CT Chest W Contrast    Standing Status:   Future    Standing Expiration Date:   05/26/2019    Order Specific Question:   ** REASON FOR EXAM (FREE TEXT)    Answer:   Restaging Lung Cancer    Order Specific  Question:   If indicated for the ordered procedure, I authorize the administration of contrast media per Radiology protocol    Answer:   Yes    Order Specific Question:   Is patient pregnant?    Answer:   No    Order Specific Question:   Preferred imaging location?    Answer:   [facility]    Order Specific Question:   Radiology Contrast Protocol - do NOT remove file path    Answer:   _0 [email or web address]  . CT Abdomen Pelvis W Contrast    Standing Status:   Future    Standing Expiration Date:   05/26/2019    Order Specific Question:   ** REASON FOR EXAM (FREE TEXT)    Answer:   Restaging Lung Cancer    Order Specific Question:   If indicated for the ordered procedure, I authorize the administration of contrast media per Radiology protocol    Answer:   Yes    Order Specific Question:   Is patient pregnant?    Answer:   No    Order Specific Question:   Preferred imaging location?    Answer:   [facility]    Order Specific Question:   Is Oral Contrast requested for this exam?    Answer:   Yes, Per Radiology protocol    Order Specific Question:   Radiology Contrast Protocol - do NOT remove file path    Answer:   _1 [email or web address]  . CMP ([facility] only)    Standing Status:   Future    Standing Expiration Date:   05/26/2019  . CBC with Differential (Cancer Center Only)    Standing Status:   Future    Standing Expiration Date:   05/26/2019     Tobe Sos , PA-C 05/26/18  ADDENDUM: Hematology/Oncology Attending: I had a face-to-face encounter with the patient.  I recommended her care plan.  This is a very pleasant 48 years old Asian female with metastatic non-small cell lung cancer, adenocarcinoma  with positive EGFR mutation status post treatment with Tarceva as well as Tagrisso but most recently had evidence for disease progression with MET amplification.  The patient continued her treatment with  Tagrisso but we added Xalkori 250 mg p.o. twice daily.  She has been tolerating the combination treatment fairly well with no concerning adverse effect except for mild visual disturbance in the morning.  She also has intermittent abdominal and back pain. I recommended for the patient to continue her current treatment with Tagrisso and Xalkori.  Repeat EKG today showed no concerning findings. We will see her back for follow-up visit in 1 months for evaluation after repeating CT scan of the chest, abdomen and pelvis for restaging of her disease. The patient was advised to call immediately if she has any concerning symptoms in the interval.  Disclaimer: This note was dictated with voice recognition software. Similar sounding words can inadvertently be transcribed and may be missed upon review. Mohamed K Mohamed, MD 05/27/18  

## 2018-05-27 ENCOUNTER — Encounter: Payer: Self-pay | Admitting: Physician Assistant

## 2018-05-27 ENCOUNTER — Telehealth: Payer: Self-pay | Admitting: Internal Medicine

## 2018-05-27 NOTE — Telephone Encounter (Signed)
Scheduled appt per 5/11 los - reminder letter sent in the mail with appt date and time

## 2018-05-29 MED FILL — XALKORI 250 MG CAPSULE: 250 | 30 days supply | Qty: 60 | Fill #1

## 2018-05-29 MED FILL — XARELTO 20 MG TABLET: 20 | 30 days supply | Qty: 30 | Fill #1

## 2018-05-29 MED FILL — TAGRISSO 80 MG TABLET: 80 | 30 days supply | Qty: 30 | Fill #0

## 2018-06-02 ENCOUNTER — Telehealth: Payer: Self-pay | Admitting: Internal Medicine

## 2018-06-02 ENCOUNTER — Encounter: Payer: Self-pay | Admitting: Internal Medicine

## 2018-06-02 ENCOUNTER — Other Ambulatory Visit: Payer: Self-pay

## 2018-06-02 ENCOUNTER — Inpatient Hospital Stay (HOSPITAL_BASED_OUTPATIENT_CLINIC_OR_DEPARTMENT_OTHER): Payer: Medicare Other | Admitting: Internal Medicine

## 2018-06-02 VITALS — BP 92/46 | HR 103 | Temp 98.0°F | Ht 59.0 in | Wt 103.0 lb

## 2018-06-02 DIAGNOSIS — G4089 Other seizures: Secondary | ICD-10-CM

## 2018-06-02 DIAGNOSIS — C7951 Secondary malignant neoplasm of bone: Secondary | ICD-10-CM | POA: Diagnosis not present

## 2018-06-02 DIAGNOSIS — R630 Anorexia: Secondary | ICD-10-CM

## 2018-06-02 DIAGNOSIS — C7972 Secondary malignant neoplasm of left adrenal gland: Secondary | ICD-10-CM | POA: Diagnosis not present

## 2018-06-02 DIAGNOSIS — R112 Nausea with vomiting, unspecified: Secondary | ICD-10-CM

## 2018-06-02 DIAGNOSIS — H539 Unspecified visual disturbance: Secondary | ICD-10-CM

## 2018-06-02 DIAGNOSIS — Z7951 Long term (current) use of inhaled steroids: Secondary | ICD-10-CM

## 2018-06-02 DIAGNOSIS — C7931 Secondary malignant neoplasm of brain: Secondary | ICD-10-CM

## 2018-06-02 DIAGNOSIS — Z79899 Other long term (current) drug therapy: Secondary | ICD-10-CM

## 2018-06-02 DIAGNOSIS — Z7901 Long term (current) use of anticoagulants: Secondary | ICD-10-CM

## 2018-06-02 DIAGNOSIS — R42 Dizziness and giddiness: Secondary | ICD-10-CM

## 2018-06-02 DIAGNOSIS — C342 Malignant neoplasm of middle lobe, bronchus or lung: Secondary | ICD-10-CM

## 2018-06-02 DIAGNOSIS — R200 Anesthesia of skin: Secondary | ICD-10-CM

## 2018-06-02 DIAGNOSIS — R5383 Other fatigue: Secondary | ICD-10-CM

## 2018-06-02 DIAGNOSIS — M549 Dorsalgia, unspecified: Secondary | ICD-10-CM

## 2018-06-02 MED ORDER — LEVETIRACETAM 250 MG PO TABS: 250.0000 mg | ORAL_TABLET | Freq: Two times a day (BID) | ORAL | 0 refills | Status: DC

## 2018-06-02 MED ORDER — DEXAMETHASONE 4 MG PO TABS: 4.0000 mg | ORAL_TABLET | Freq: Every day | ORAL | 0 refills | Status: DC

## 2018-06-02 MED FILL — DEXAMETHASONE 4 MG TABLET: 4 | 7 days supply | Qty: 7 | Fill #0

## 2018-06-02 NOTE — Telephone Encounter (Signed)
Scheduled appt per 5/18 los. ° °A calendar will be mailed out. °

## 2018-06-02 NOTE — Progress Notes (Signed)
[facility] [place], [place] [address] [phone]   Interval Evaluation  Date of Service: 06/02/18 Patient Name: Deborah Foster Patient MRN: [id number] Patient DOB: [birth year] Provider: Ventura Sellers, MD  Identifying Statement:  Deborah Foster is a 48 y.o. female with Metastasis to brain [facility]) [C79.31]   Primary Cancer: Lung adeno w/ EGFR mutation  CNS Oncologic History: 08/23/14-09/07/14:  WBRT 30 Gy in 12 fractions  Interval History:  Deborah Foster presents today to review clinical progress following visit last week.  She describes taking Keppra '500mg'$  twice per day (rather than '1000mg'$  twice per day), but remarks significant improvement in visual episodes.  They still occur but no more than one per day, typically in the morning when she wakes up.  She does describe lethargy with the medication and poor appetite.  Also describes light headed feeling when standing up suddenly which is worsened since starting Keppra.    H+P (05/19/18) Patient presents today to review clinical changes and recent MRI brain.  She describes onset of visual changes, described primarily as "episodes of flashing lights".  These episodes occur roughly daily, and tend to last for ~5 minutes before resolving spontaneously.  First episode occurred roughly two weeks ago, very soon after starting crizotinib for progressive EGFR mutant lung adenocarcinoma.  Otherwise vision is not clearly disturbed, maintains full functional independence.  She continues on Tagrisso as well.  She also describes intermittent medial arm numbness, described as "pins and needles" that usually is only symptomatic in the evening.  This has been stable over the past 1 year.    Medications: Current Outpatient Medications on File Prior to Visit  Medication Sig Dispense Refill  . AMBULATORY NON FORMULARY MEDICATION Medication Name: MAGIC MOUTHWASH 2 % Viscous Lidocaine  Maalox Benadryl Susp.  Disp:  1:1:1 225m bottle Sig: 143mPO Swish & Spit  q 3-4hrs. PRN mouth sores (Patient not taking: Reported on 07/29/2017) 200 mL 3  . benzonatate (TESSALON) 200 MG capsule TK ONE C PO Q 8 H PRN COU  0  . Calcium Carbonate-Vitamin D (CALCIUM 500 + D) 500-125 MG-UNIT TABS Take 500 mg by mouth 2 (two) times daily.    . chlorpheniramine-HYDROcodone (TUSSIONEX) 10-8 MG/5ML SUER Take 5 mLs by mouth every 12 (twelve) hours as needed. (Patient not taking: Reported on 04/08/2018) 140 mL 0  . clindamycin (CLINDAGEL) 1 % gel Apply 1 application topically 2 (two) times daily.    . clobetasol cream (TEMOVATE) 0.0.73 Apply 1 application topically 2 (two) times daily.    . crizotinib (XALKORI) 250 MG capsule Take 1 capsule (250 mg total) by mouth 2 (two) times daily. 60 capsule 2  . Cyanocobalamin (VITAMIN B-12) 2500 MCG SUBL Place 2,500 mcg under the tongue daily.    . Marland Kitchenextromethorphan Polistirex (DELSYM PO) Take by mouth.    . ENSURE (ENSURE) Take 1 Can by mouth 2 (two) times daily between meals.     . ferrous sulfate 325 (65 FE) MG EC tablet Take 325 mg by mouth daily with breakfast.    . fluticasone (VERAMYST) 27.5 MCG/SPRAY nasal spray Place 2 sprays into the nose daily.    . folic acid (FOLVITE) 80710CG tablet Take 1 tablet (800 mcg total) by mouth daily. 30 tablet 1  . levETIRAcetam (KEPPRA) 500 MG tablet Take 2 tablets (1,000 mg total) by mouth 2 (two) times daily for 14 days. 56 tablet 0  . loratadine (CLARITIN) 10  MG tablet Take 1 tablet (10 mg total) by mouth daily. 30 tablet 0  . mirtazapine (REMERON) 15 MG tablet Take 1 tablet (15 mg total) by mouth at bedtime. 30 tablet 0  . Nutritional Supplements (JUICE PLUS FIBRE PO) Take 1 capsule by mouth daily. Reported on 07/28/2015    . omeprazole (PRILOSEC) 20 MG capsule Take 20 mg by mouth daily.    . ondansetron (ZOFRAN) 8 MG tablet Take 1 tablet (8 mg total) by mouth every 8 (eight) hours as needed for nausea or vomiting. 30 tablet 0  . OVER THE COUNTER  MEDICATION "Deep penetrating pain relief oil"    . prochlorperazine (COMPAZINE) 10 MG tablet TAKE 1 TABLET BY MOUTH EVERY 6 HOURS AS NEEDED FOR NAUSEA OR VOMITING. 30 tablet 0  . ranitidine (ZANTAC) 300 MG tablet Take 1 tablet (300 mg total) by mouth at bedtime. (Patient not taking: Reported on 05/19/2018) 30 tablet 6  . rivaroxaban (XARELTO) 20 MG TABS tablet Take 1 tablet (20 mg total) by mouth daily with supper. 30 tablet 1  . TAGRISSO 80 MG tablet TAKE 1 TABLET (80 MG TOTAL) BY MOUTH DAILY. 30 tablet 11  . TURMERIC PO Take 1 capsule by mouth 2 (two) times daily. Turmeric Superior 750 mg po bid     No current facility-administered medications on file prior to visit.     Allergies:  Allergies  Allergen Reactions  . Other Other (See Comments)    Patient states that there is some she takes that makes her throat "cry" AND SCRATCHY   . Doxycycline Nausea And Vomiting   Past Medical History:  Past Medical History:  Diagnosis Date  . Bone metastasis ([place])   . Hemoptysis   . Hypokalemia   . lung ca dx'd 07/2014  . Lung mass   . Metastasis to adrenal gland ([place])   . Metastasis to brain ([place])   . Pneumonia   . Radiation 08/23/14-09/07/14   Brain/chest and left hip 30 Gy 12 Fx  . URI (upper respiratory infection) 02-02-2015   Past Surgical History:  Past Surgical History:  Procedure Laterality Date  . IR GENERIC HISTORICAL  07/14/2015   IR RADIOLOGIST EVAL & MGMT 07/14/2015 Greggory Keen, MD [facility] INTERV RAD  . VIDEO BRONCHOSCOPY Bilateral 08/09/2014   Procedure: VIDEO BRONCHOSCOPY WITHOUT FLUORO;  Surgeon: [name], MD;  Location: [facility] ENDOSCOPY;  Service: Cardiopulmonary;  Laterality: Bilateral;  . VIDEO BRONCHOSCOPY Bilateral 07/06/2015   Procedure: VIDEO BRONCHOSCOPY WITHOUT FLUORO;  Surgeon: [name], MD;  Location: [facility] ENDOSCOPY;  Service: Cardiopulmonary;  Laterality: Bilateral;   Social History:  Social History   Socioeconomic History  . Marital status: Married     Spouse name: Not on file  . Number of children: 3  . Years of education: Not on file  . Highest education level: Not on file  Occupational History  . Occupation: unemployed  Social Needs  . Financial resource strain: Not on file  . Food insecurity:    Worry: Not on file    Inability: Not on file  . Transportation needs:    Medical: Not on file    Non-medical: Not on file  Tobacco Use  . Smoking status: Never Smoker  . Smokeless tobacco: Never Used  Substance and Sexual Activity  . Alcohol use: No    Alcohol/week: 0.0 standard drinks  . Drug use: No  . Sexual activity: Not Currently  Lifestyle  . Physical activity:    Days per week: Not on  file    Minutes per session: Not on file  . Stress: Not on file  Relationships  . Social connections:    Talks on phone: Not on file    Gets together: Not on file    Attends religious service: Not on file    Active member of club or organization: Not on file    Attends meetings of clubs or organizations: Not on file    Relationship status: Not on file  . Intimate partner violence:    Fear of current or ex partner: Not on file    Emotionally abused: Not on file    Physically abused: Not on file    Forced sexual activity: Not on file  Other Topics Concern  . Not on file  Social History Narrative   Patient moved to [place] in [place].   Had been in [place] since that time.   Family History:  Family History  Problem Relation Age of Onset  . Hyperlipidemia Mother     Review of Systems: Constitutional: Denies fevers, chills or abnormal weight loss Eyes: Denies blurriness of vision Ears, nose, mouth, throat, and face: Denies mucositis or sore throat Respiratory: Denies cough, dyspnea or wheezes Cardiovascular: Denies palpitation, chest discomfort or lower extremity swelling Gastrointestinal:  Denies nausea, constipation, diarrhea GU: Denies dysuria or incontinence Skin: Denies abnormal skin rashes Neurological: Per HPI  Musculoskeletal: Denies joint pain, back or neck discomfort. No decrease in ROM Behavioral/Psych: Denies anxiety, disturbance in thought content, and mood instability   Physical Exam: Vitals:   06/02/18 0915  BP: (!) 92/46  Pulse: (!) 103  Temp: 98 F (36.7 C)  SpO2: 100%   KPS: 90. General: Alert, cooperative, pleasant, in no acute distress Head: Normal EENT: No conjunctival injection or scleral icterus. Oral mucosa moist Lungs: Resp effort normal Cardiac: Regular rate and rhythm Abdomen: Soft, non-distended abdomen Skin: No rashes cyanosis or petechiae. Extremities: No clubbing or edema  Neurologic Exam: Mental Status: Awake, alert, attentive to examiner. Oriented to self and environment. Language is fluent with intact comprehension.  Cranial Nerves: Visual acuity is grossly normal. Visual fields are full. Extra-ocular movements intact. No ptosis. Face is symmetric, tongue midline. Motor: Tone and bulk are normal. Power is full in both arms and legs. Reflexes are symmetric, no pathologic reflexes present. Intact finger to nose bilaterally Sensory: Normal Gait: Normal and tandem gait is normal.   Labs: I have reviewed the data as listed    Component Value Date/Time   NA 141 05/26/2018 0831   NA 140 01/17/2017 1003   K 3.5 05/26/2018 0831   K 3.9 01/17/2017 1003   CL 105 05/26/2018 0831   CO2 27 05/26/2018 0831   CO2 29 01/17/2017 1003   GLUCOSE 88 05/26/2018 0831   GLUCOSE 69 (L) 01/17/2017 1003   BUN 9 05/26/2018 0831   BUN 14.8 01/17/2017 1003   CREATININE 0.71 05/26/2018 0831   CREATININE 0.8 01/17/2017 1003   CALCIUM 7.8 (L) 05/26/2018 0831   CALCIUM 8.7 01/17/2017 1003   PROT 6.0 (L) 05/26/2018 0831   PROT 6.4 01/17/2017 1003   ALBUMIN 2.9 (L) 05/26/2018 0831   ALBUMIN 3.6 01/17/2017 1003   AST 28 05/26/2018 0831   AST 23 01/17/2017 1003   ALT 33 05/26/2018 0831   ALT 27 01/17/2017 1003   ALKPHOS 71 05/26/2018 0831   ALKPHOS 50 01/17/2017 1003    BILITOT 0.2 (L) 05/26/2018 0831   BILITOT 0.46 01/17/2017 1003   GFRNONAA >60 05/26/2018  0831   GFRAA >60 05/26/2018 0831   Lab Results  Component Value Date   WBC 3.6 (L) 05/26/2018   NEUTROABS 2.3 05/26/2018   HGB 12.2 05/26/2018   HCT 38.0 05/26/2018   MCV 97.7 05/26/2018   PLT 277 05/26/2018    Imaging:  Mr Jeri Cos BW Contrast  Result Date: 05/13/2018 CLINICAL DATA:  SRS protocol. Primary lung cancer with metastatic disease. Frequent headaches. Blurred vision. EXAM: MRI HEAD WITHOUT AND WITH CONTRAST TECHNIQUE: Multiplanar, multiecho pulse sequences of the brain and surrounding structures were obtained without and with intravenous contrast. CONTRAST:  15m MULTIHANCE GADOBENATE DIMEGLUMINE 529 MG/ML IV SOLN COMPARISON:  01/24/2018. FINDINGS: Brain: Interval stability of LEFT frontal metastasis, RIGHT frontal parasagittal metastasis, RIGHT parietal parasagittal metastasis, and RIGHT posterior temporal metastasis. Apparent worsening of LEFT posterior temporal metastasis, stable in size (18 mm) but increasing vasogenic edema, as well as apparent worsening LEFT cerebellar metastasis, slight increase size (7 mm) and increasing vasogenic edema. Numerous tiny microbleeds on gradient sequence, probably stable. No new enhancing lesions are seen. Premature for age atrophy.  Minor white matter disease. Vascular: Flow voids are maintained. Skull and upper cervical spine: Normal marrow signal. Sinuses/Orbits: Negative. Other: None. IMPRESSION: Worsening vasogenic edema surrounding the LEFT temporal metastasis. Increase in size and edema surrounding LEFT cerebellar metastasis. Other treated metastases are stable. Electronically Signed   By: JStaci RighterM.D.   On: 05/13/2018 13:30    CLackland AFBClinician Interpretation: I have personally reviewed the radiological images as listed.  My interpretation, in the context of the patient's clinical presentation, is likely treatment effect   Assessment/Plan 1.  Metastasis to brain ([facility]  Ms. LHochbergpresents today with positive response to AED therapy, confirming etiology of visual episodes as focal seizures.    We recommended decreasing Keppra to '250mg'$  BID because of side effects.   We also recommended short course of decadron ('4mg'$  x7 days) to treat residual CNS inflammation potentially driving the seizures.  She should return to clinic with repeat MRI brain in 3 months.    We appreciate the opportunity to participate in the care of Deborah Foster.    All questions were answered. The patient knows to call the clinic with any problems, questions or concerns. No barriers to learning were detected.  The total time spent in the encounter was 25 minutes and more than 50% was on counseling and review of test results   ZVentura Sellers MD Medical Director of Neuro-Oncology [facility] W[place]05/18/20 9:07 AM

## 2018-06-05 ENCOUNTER — Other Ambulatory Visit: Payer: Self-pay | Admitting: *Deleted

## 2018-06-05 DIAGNOSIS — C7931 Secondary malignant neoplasm of brain: Secondary | ICD-10-CM

## 2018-06-05 NOTE — Progress Notes (Signed)
  Radiation Oncology         [phone] ________________________________  Name: IRIDIAN READER MRN: [id number]  Date: 04/29/2018  DOB: [birth year]  End of Treatment Note  Diagnosis:   48 y.o. female with Stage IV T2a, N1, M1b NSCLC, adenocarcinoma, positive EGFR mutation with deletion in exon 19, of the right middle lobe with retroperitoneal and left supraclavicular nodes     Indication for treatment:  Palliative       Radiation treatment dates:   04/16/2018 - 04/29/2018  Site/dose:    1.  Abdomen/retroperitoneal nodes // 30 Gy in 10 fractions 2.  Left SCLV/mediastinum // 30 Gy in 10 fractions  Beams/energy:    1. 3D // 10X, 6X Photon 2. 3D // 10X, 6X Photon   Narrative: The patient tolerated radiation treatment relatively well.   She experienced mild fatigue and loss of appetite along with nausea/vomiting/diarrhea. She was encouraged to continue supplementing with Boost drinks as much as possible. She also noted blurry vision.  Plan: The patient has completed radiation treatment. We discussed possibly proceeding with brain MRI sooner than planned, and she will let us know if her vision worsens. Otherwise, the patient will return to radiation oncology clinic for routine followup in one month. I advised them to call or return sooner if they have any questions or concerns related to their recovery or treatment.  ------------------------------------------------  Jodelle Gross, MD, PhD  This document serves as a record of services personally performed by Kyung Rudd, MD. It was created on his behalf by Rae Lips, a trained medical scribe. The creation of this record is based on the scribe's personal observations and the provider's statements to them. This document has been checked and approved by the attending provider.

## 2018-06-12 ENCOUNTER — Ambulatory Visit: Payer: Self-pay

## 2018-06-12 ENCOUNTER — Telehealth: Payer: Self-pay | Admitting: Medical Oncology

## 2018-06-12 ENCOUNTER — Other Ambulatory Visit: Payer: Self-pay

## 2018-06-12 NOTE — Telephone Encounter (Signed)
Not doing well-New left sided mid back pain -tylenol not helping. She feels weak ,cannot eat and constipation, last BM small amt today. Wants  CT scan moved up from June 9th.

## 2018-06-12 NOTE — Telephone Encounter (Signed)
OK 

## 2018-06-13 NOTE — Telephone Encounter (Signed)
Called central scheduling-they will contact patient

## 2018-06-18 ENCOUNTER — Other Ambulatory Visit: Payer: Self-pay

## 2018-06-18 ENCOUNTER — Ambulatory Visit (HOSPITAL_COMMUNITY)
Admission: RE | Admit: 2018-06-18 | Discharge: 2018-06-18 | Disposition: A | Discharge status: Home / Self Care | Payer: Medicare Other | Source: Ambulatory Visit | Attending: Physician Assistant | Admitting: Physician Assistant

## 2018-06-18 DIAGNOSIS — C3491 Malignant neoplasm of unspecified part of right bronchus or lung: Secondary | ICD-10-CM | POA: Diagnosis present

## 2018-06-18 MED ORDER — IOHEXOL 300 MG/ML  SOLN
100.0000 mL | Freq: Once | INTRAMUSCULAR | Status: AC | PRN
  Administered 2018-06-18: 100 mL via INTRAVENOUS

## 2018-06-18 MED ORDER — SODIUM CHLORIDE (PF) 0.9 % IJ SOLN
INTRAMUSCULAR | Status: AC
  Filled 2018-06-18: qty 50

## 2018-06-20 ENCOUNTER — Telehealth: Payer: Self-pay | Admitting: *Deleted

## 2018-06-20 NOTE — Telephone Encounter (Signed)
Received call from patient. She states she would like to move up her appt with Dr. Mohamed-currently scheduled for 06/30/18. She states she does not fell well-states her appetite is down and not sleeping well. She states she does not feel well overall.  Please advise

## 2018-06-20 NOTE — Telephone Encounter (Signed)
Ok to see her sooner if we have any availability.

## 2018-06-20 NOTE — Telephone Encounter (Signed)
Scheduling message sent. 

## 2018-06-23 ENCOUNTER — Inpatient Hospital Stay: Payer: Medicare Other

## 2018-06-23 ENCOUNTER — Inpatient Hospital Stay (HOSPITAL_BASED_OUTPATIENT_CLINIC_OR_DEPARTMENT_OTHER): Payer: Medicare Other | Admitting: Internal Medicine

## 2018-06-23 ENCOUNTER — Encounter: Payer: Self-pay | Admitting: Medical Oncology

## 2018-06-23 ENCOUNTER — Other Ambulatory Visit: Payer: Self-pay | Admitting: Physician Assistant

## 2018-06-23 ENCOUNTER — Inpatient Hospital Stay: Payer: Medicare Other | Attending: Internal Medicine

## 2018-06-23 ENCOUNTER — Encounter: Payer: Self-pay | Admitting: Internal Medicine

## 2018-06-23 ENCOUNTER — Other Ambulatory Visit: Payer: Self-pay

## 2018-06-23 VITALS — BP 100/60 | HR 97

## 2018-06-23 VITALS — BP 101/60 | HR 110 | Temp 98.9°F | Resp 18 | Ht 59.0 in | Wt 99.4 lb

## 2018-06-23 DIAGNOSIS — E889 Metabolic disorder, unspecified: Secondary | ICD-10-CM

## 2018-06-23 DIAGNOSIS — Z7901 Long term (current) use of anticoagulants: Secondary | ICD-10-CM | POA: Diagnosis not present

## 2018-06-23 DIAGNOSIS — K921 Melena: Secondary | ICD-10-CM | POA: Diagnosis not present

## 2018-06-23 DIAGNOSIS — R634 Abnormal weight loss: Secondary | ICD-10-CM | POA: Diagnosis not present

## 2018-06-23 DIAGNOSIS — Z79899 Other long term (current) drug therapy: Secondary | ICD-10-CM | POA: Insufficient documentation

## 2018-06-23 DIAGNOSIS — M542 Cervicalgia: Secondary | ICD-10-CM | POA: Insufficient documentation

## 2018-06-23 DIAGNOSIS — E860 Dehydration: Secondary | ICD-10-CM

## 2018-06-23 DIAGNOSIS — C3491 Malignant neoplasm of unspecified part of right bronchus or lung: Secondary | ICD-10-CM

## 2018-06-23 DIAGNOSIS — Z86718 Personal history of other venous thrombosis and embolism: Secondary | ICD-10-CM | POA: Diagnosis not present

## 2018-06-23 DIAGNOSIS — C7951 Secondary malignant neoplasm of bone: Secondary | ICD-10-CM

## 2018-06-23 DIAGNOSIS — R5383 Other fatigue: Secondary | ICD-10-CM | POA: Insufficient documentation

## 2018-06-23 DIAGNOSIS — M898X9 Other specified disorders of bone, unspecified site: Secondary | ICD-10-CM

## 2018-06-23 DIAGNOSIS — C7931 Secondary malignant neoplasm of brain: Secondary | ICD-10-CM | POA: Diagnosis not present

## 2018-06-23 DIAGNOSIS — R197 Diarrhea, unspecified: Secondary | ICD-10-CM

## 2018-06-23 DIAGNOSIS — R531 Weakness: Secondary | ICD-10-CM | POA: Diagnosis not present

## 2018-06-23 DIAGNOSIS — C3431 Malignant neoplasm of lower lobe, right bronchus or lung: Secondary | ICD-10-CM

## 2018-06-23 DIAGNOSIS — M545 Low back pain: Secondary | ICD-10-CM | POA: Insufficient documentation

## 2018-06-23 DIAGNOSIS — M25512 Pain in left shoulder: Secondary | ICD-10-CM | POA: Insufficient documentation

## 2018-06-23 DIAGNOSIS — R630 Anorexia: Secondary | ICD-10-CM | POA: Insufficient documentation

## 2018-06-23 DIAGNOSIS — C7972 Secondary malignant neoplasm of left adrenal gland: Secondary | ICD-10-CM | POA: Diagnosis not present

## 2018-06-23 DIAGNOSIS — Z923 Personal history of irradiation: Secondary | ICD-10-CM | POA: Insufficient documentation

## 2018-06-23 DIAGNOSIS — R251 Tremor, unspecified: Secondary | ICD-10-CM | POA: Insufficient documentation

## 2018-06-23 DIAGNOSIS — G4700 Insomnia, unspecified: Secondary | ICD-10-CM | POA: Diagnosis not present

## 2018-06-23 DIAGNOSIS — C342 Malignant neoplasm of middle lobe, bronchus or lung: Secondary | ICD-10-CM

## 2018-06-23 DIAGNOSIS — M546 Pain in thoracic spine: Secondary | ICD-10-CM | POA: Insufficient documentation

## 2018-06-23 DIAGNOSIS — I82442 Acute embolism and thrombosis of left tibial vein: Secondary | ICD-10-CM

## 2018-06-23 DIAGNOSIS — Z5111 Encounter for antineoplastic chemotherapy: Secondary | ICD-10-CM

## 2018-06-23 DIAGNOSIS — Z7951 Long term (current) use of inhaled steroids: Secondary | ICD-10-CM | POA: Diagnosis not present

## 2018-06-23 DIAGNOSIS — C772 Secondary and unspecified malignant neoplasm of intra-abdominal lymph nodes: Secondary | ICD-10-CM

## 2018-06-23 LAB — CMP (CANCER CENTER ONLY)
ALT: 83 U/L — ABNORMAL HIGH (ref 0–44)
AST: 44 U/L — ABNORMAL HIGH (ref 15–41)
Albumin: 2.2 g/dL — ABNORMAL LOW (ref 3.5–5.0)
Alkaline Phosphatase: 187 U/L — ABNORMAL HIGH (ref 38–126)
Anion gap: 7 (ref 5–15)
BUN: 12 mg/dL (ref 6–20)
CO2: 26 mmol/L (ref 22–32)
Calcium: 7.3 mg/dL — ABNORMAL LOW (ref 8.9–10.3)
Chloride: 98 mmol/L (ref 98–111)
Creatinine: 0.59 mg/dL (ref 0.44–1.00)
GFR, Est AFR Am: >60 mL/min (ref >60)
GFR, Estimated: >60 mL/min (ref >60)
Glucose, Bld: 96 mg/dL (ref 70–99)
Potassium: 4.3 mmol/L (ref 3.5–5.1)
Sodium: 131 mmol/L — ABNORMAL LOW (ref 135–145)
Total Bilirubin: 0.3 mg/dL (ref 0.3–1.2)
Total Protein: 5.4 g/dL — ABNORMAL LOW (ref 6.5–8.1)

## 2018-06-23 LAB — CBC WITH DIFFERENTIAL (CANCER CENTER ONLY)
Abs Immature Granulocytes: 0.12 10*3/uL — ABNORMAL HIGH (ref 0.00–0.07)
Basophils Absolute: 0 10*3/uL (ref 0.0–0.1)
Basophils Relative: 0 %
Eosinophils Absolute: 0.2 10*3/uL (ref 0.0–0.5)
Eosinophils Relative: 4 %
HCT: 31.1 % — ABNORMAL LOW (ref 36.0–46.0)
Hemoglobin: 10.1 g/dL — ABNORMAL LOW (ref 12.0–15.0)
Immature Granulocytes: 3 %
Lymphocytes Relative: 12 %
Lymphs Abs: 0.5 10*3/uL — ABNORMAL LOW (ref 0.7–4.0)
MCH: 31.4 pg (ref 26.0–34.0)
MCHC: 32.5 g/dL (ref 30.0–36.0)
MCV: 96.6 fL (ref 80.0–100.0)
Monocytes Absolute: 0.7 10*3/uL (ref 0.1–1.0)
Monocytes Relative: 17 %
Neutro Abs: 2.7 10*3/uL (ref 1.7–7.7)
Neutrophils Relative %: 64 %
Platelet Count: 218 10*3/uL (ref 150–400)
RBC: 3.22 MIL/uL — ABNORMAL LOW (ref 3.87–5.11)
RDW: 14.9 % (ref 11.5–15.5)
WBC Count: 4.2 10*3/uL (ref 4.0–10.5)
nRBC: 0 % (ref 0.0–0.2)

## 2018-06-23 MED ORDER — DENOSUMAB 120 MG/1.7ML ~~LOC~~ SOLN
SUBCUTANEOUS | Status: AC
  Filled 2018-06-23: qty 1.7

## 2018-06-23 MED ORDER — SODIUM CHLORIDE 0.9 % IV SOLN
Freq: Once | INTRAVENOUS | Status: AC
  Administered 2018-06-23: 14:00:00 via INTRAVENOUS
  Filled 2018-06-23: qty 250

## 2018-06-23 MED ORDER — DENOSUMAB 120 MG/1.7ML ~~LOC~~ SOLN: 120.0000 mg | Freq: Once | SUBCUTANEOUS | Status: DC

## 2018-06-23 MED FILL — ONDANSETRON HCL 8 MG TABLET: 8 | 10 days supply | Qty: 30 | Fill #0

## 2018-06-23 MED FILL — XARELTO 20 MG TABLET: 20 | 30 days supply | Qty: 30 | Fill #0

## 2018-06-23 NOTE — Progress Notes (Signed)
Pt received 1L IVF NS today, tolerated well.  Able to drink during infusion without any issues.  VSS.  Translator present.  RN Diane alerted to pt requesting a 'letter of diagnosis/treatment' for citizenship process.  Reports feeling a bit better at end of infusion.

## 2018-06-23 NOTE — Progress Notes (Signed)
[facility] [phone]) [phone]   [phone]) [phone]  OFFICE PROGRESS NOTE  [name], MD [place] [address]  DIAGNOSIS:  1) Stage IV (T2a, N1, M1b) non-small cell lung cancer, adenocarcinoma with positive EGFR mutation (deletion 81) presented with right middle lobe lung mass, right hilar adenopathy as well as metastatic disease to the bone, brain and left adrenal diagnosed in July 2016. 2) deep venous thrombosis of the left lower extremity diagnosed in March 2020  PRIOR THERAPY: 1) palliative radiotherapy to the brain as well as metastatic bone lesions in the pelvis. 2) Tarceva 150 mg by mouth daily started on 09/25/2014. Status post 5 months of treatment. 3) status post palliative radiotherapy to the right middle lobe lung mass for treatment of hemoptysis. 4) Tarceva 100 mg by mouth daily started 02/19/2015 status post 13 months of treatment. This was discontinued in May 2018 after the patient had disease progression and development of T790M resistant mutation.  5) palliative stereotactic radiotherapy to the left adrenal gland metastatic lesion under the care of Dr. Lisbeth Renshaw completed on April 10, 2017.  CURRENT THERAPY: 1) Tagrisso 80 mg by mouth daily started 06/02/2016.  Status post 24 months of treatment. 2) Xgeva 120 g subcutaneously every 6 weeks. 3) Xalkori 250 mg p.o. twice daily started 05/08/2018.  INTERVAL HISTORY: Deborah Foster 48 y.o. female returns to the clinic today for follow-up visit accompanied by her [place] interpreter.  The patient is feeling fine today except for the weakness in the lower extremities as well as low back pain.  She had an episode of bloody diarrhea recently and she has her Xarelto held during that time.  She denied having any current chest pain, shortness of breath, cough or hemoptysis.  She denied having any fever or chills.  She has no nausea but vomited twice in early June.  The patient was  a started on Xalkori in addition to [place] and has been tolerating the treatment well except for visual changes early in the morning.  She also complaining of increasing fatigue as well as lack of appetite and she lost few pounds since her last visit.  She had repeat CT scan of the chest, abdomen pelvis performed recently and she is here for evaluation and discussion of her scan results.  MEDICAL HISTORY: Past Medical History:  Diagnosis Date   Bone metastasis ([facility])    Hemoptysis    Hypokalemia    lung ca dx'd 07/2014   Lung mass    Metastasis to adrenal gland ([facility])    Metastasis to brain [facility])    Pneumonia    Radiation 08/23/14-09/07/14   Brain/chest and left hip 30 Gy 12 Fx   URI (upper respiratory infection) 02-02-2015    ALLERGIES:  is allergic to other and doxycycline.  MEDICATIONS:  Current Outpatient Medications  Medication Sig Dispense Refill   AMBULATORY NON FORMULARY MEDICATION Medication Name: MAGIC MOUTHWASH 2 % Viscous Lidocaine  Maalox Benadryl Susp.  Disp: 1:1:1 220m bottle Sig: 158mPO Swish & Spit  q 3-4hrs. PRN mouth sores (Patient not taking: Reported on 07/29/2017) 200 mL 3   benzonatate (TESSALON) 200 MG capsule TK ONE C PO Q 8 H PRN COU  0   Calcium Carbonate-Vitamin D (CALCIUM 500 + D) 500-125 MG-UNIT TABS Take 500 mg by mouth 2 (two) times daily.     chlorpheniramine-HYDROcodone (TUSSIONEX) 10-8 MG/5ML SUER Take 5 mLs by mouth every 12 (twelve) hours as needed. (Patient  not taking: Reported on 04/08/2018) 140 mL 0   clindamycin (CLINDAGEL) 1 % gel Apply 1 application topically 2 (two) times daily.     clobetasol cream (TEMOVATE) 8.31 % Apply 1 application topically 2 (two) times daily.     crizotinib (XALKORI) 250 MG capsule Take 1 capsule (250 mg total) by mouth 2 (two) times daily. 60 capsule 2   Cyanocobalamin (VITAMIN B-12) 2500 MCG SUBL Place 2,500 mcg under the tongue daily.     dexamethasone (DECADRON) 4 MG tablet Take 1 tablet (4 mg  total) by mouth daily. 7 tablet 0   Dextromethorphan Polistirex (DELSYM PO) Take by mouth.     ENSURE (ENSURE) Take 1 Can by mouth 2 (two) times daily between meals.      ferrous sulfate 325 (65 FE) MG EC tablet Take 325 mg by mouth daily with breakfast.     fluticasone (VERAMYST) 27.5 MCG/SPRAY nasal spray Place 2 sprays into the nose daily.     folic acid (FOLVITE) 517 MCG tablet Take 1 tablet (800 mcg total) by mouth daily. 30 tablet 1   levETIRAcetam (KEPPRA) 250 MG tablet Take 1 tablet (250 mg total) by mouth 2 (two) times daily for 14 days. 28 tablet 0   loratadine (CLARITIN) 10 MG tablet Take 1 tablet (10 mg total) by mouth daily. 30 tablet 0   mirtazapine (REMERON) 15 MG tablet Take 1 tablet (15 mg total) by mouth at bedtime. 30 tablet 0   Nutritional Supplements (JUICE PLUS FIBRE PO) Take 1 capsule by mouth daily. Reported on 07/28/2015     omeprazole (PRILOSEC) 20 MG capsule Take 20 mg by mouth daily.     ondansetron (ZOFRAN) 8 MG tablet TAKE 1 TABLET (8 MG TOTAL) BY MOUTH EVERY 8 HOURS AS NEEDED FOR NAUSEA OR VOMITING. 30 tablet 0   OVER THE COUNTER MEDICATION "Deep penetrating pain relief oil"     prochlorperazine (COMPAZINE) 10 MG tablet TAKE 1 TABLET BY MOUTH EVERY 6 HOURS AS NEEDED FOR NAUSEA OR VOMITING. 30 tablet 0   ranitidine (ZANTAC) 300 MG tablet Take 1 tablet (300 mg total) by mouth at bedtime. (Patient not taking: Reported on 05/19/2018) 30 tablet 6   TAGRISSO 80 MG tablet TAKE 1 TABLET (80 MG TOTAL) BY MOUTH DAILY. 30 tablet 11   TURMERIC PO Take 1 capsule by mouth 2 (two) times daily. Turmeric Superior 750 mg po bid     XARELTO 20 MG TABS tablet TAKE 1 TABLET (20 MG TOTAL) BY MOUTH DAILY WITH SUPPER. 30 tablet 1   No current facility-administered medications for this visit.     SURGICAL HISTORY:  Past Surgical History:  Procedure Laterality Date   IR GENERIC HISTORICAL  07/14/2015   IR RADIOLOGIST EVAL & MGMT 07/14/2015 Greggory Keen, MD [facility] INTERV  RAD   VIDEO BRONCHOSCOPY Bilateral 08/09/2014   Procedure: VIDEO BRONCHOSCOPY WITHOUT FLUORO;  Surgeon: [name], MD;  Location: [facility];  Service: Cardiopulmonary;  Laterality: Bilateral;   VIDEO BRONCHOSCOPY Bilateral 07/06/2015   Procedure: VIDEO BRONCHOSCOPY WITHOUT FLUORO;  Surgeon: [name], MD;  Location: [facility];  Service: Cardiopulmonary;  Laterality: Bilateral;    REVIEW OF SYSTEMS:  Constitutional: positive for fatigue and weight loss Eyes: negative Ears, nose, mouth, throat, and face: negative Respiratory: negative Cardiovascular: negative Gastrointestinal: negative Genitourinary:negative Integument/breast: negative Hematologic/lymphatic: negative Musculoskeletal:positive for back pain Neurological: negative Behavioral/Psych: negative Endocrine: negative Allergic/Immunologic: negative   PHYSICAL EXAMINATION: General appearance: alert, cooperative, fatigued and no distress Head: Normocephalic, without obvious  abnormality, atraumatic Neck: no adenopathy, no JVD, supple, symmetrical, trachea midline and thyroid not enlarged, symmetric, no tenderness/mass/nodules Lymph nodes: Cervical, supraclavicular, and axillary nodes normal. Resp: clear to auscultation bilaterally Back: symmetric, no curvature. ROM normal. No CVA tenderness. Cardio: regular rate and rhythm, S1, S2 normal, no murmur, click, rub or gallop GI: soft, non-tender; bowel sounds normal; no masses,  no organomegaly Extremities: extremities normal, atraumatic, no cyanosis or edema Neurologic: Alert and oriented X 3, normal strength and tone. Normal symmetric reflexes. Normal coordination and gait  ECOG PERFORMANCE STATUS: 1 - Symptomatic but completely ambulatory  Blood pressure 101/60, pulse (!) 110, temperature 98.9 F (37.2 C), temperature source Oral, resp. rate 18, height _0  (1.499 m), weight 99 lb 6.4 oz (45.1 kg), SpO2 100 %.  LABORATORY DATA: Lab Results  Component  Value Date   WBC 4.2 06/23/2018   HGB 10.1 (L) 06/23/2018   HCT 31.1 (L) 06/23/2018   MCV 96.6 06/23/2018   PLT 218 06/23/2018      Chemistry      Component Value Date/Time   NA 131 (L) 06/23/2018 1249   NA 140 01/17/2017 1003   K 4.3 06/23/2018 1249   K 3.9 01/17/2017 1003   CL 98 06/23/2018 1249   CO2 26 06/23/2018 1249   CO2 29 01/17/2017 1003   BUN 12 06/23/2018 1249   BUN 14.8 01/17/2017 1003   CREATININE 0.59 06/23/2018 1249   CREATININE 0.8 01/17/2017 1003      Component Value Date/Time   CALCIUM 7.3 (L) 06/23/2018 1249   CALCIUM 8.7 01/17/2017 1003   ALKPHOS 187 (H) 06/23/2018 1249   ALKPHOS 50 01/17/2017 1003   AST 44 (H) 06/23/2018 1249   AST 23 01/17/2017 1003   ALT 83 (H) 06/23/2018 1249   ALT 27 01/17/2017 1003   BILITOT 0.3 06/23/2018 1249   BILITOT 0.46 01/17/2017 1003       RADIOGRAPHIC STUDIES: Ct Chest W Contrast  Result Date: 06/18/2018 CLINICAL DATA:  Lung cancer metastatic to brain, bones and adrenal glands. Ongoing oral chemotherapy. Bloody diarrhea, weakness, fatigue. Slight weight loss, dysphagia. EXAM: CT CHEST, ABDOMEN, AND PELVIS WITH CONTRAST TECHNIQUE: Multidetector CT imaging of the chest, abdomen and pelvis was performed following the standard protocol during bolus administration of intravenous contrast. CONTRAST:  12m OMNIPAQUE IOHEXOL 300 MG/ML  SOLN COMPARISON:  03/18/2018. FINDINGS: CT CHEST FINDINGS Cardiovascular: Enlarged pulmonic trunk. Vascular structures are otherwise unremarkable. Heart size normal. No pericardial effusion. Mediastinum/Nodes: No pathologically enlarged mediastinal, hilar or axillary lymph nodes. Esophagus is grossly unremarkable. Lungs/Pleura: New airspace consolidation and surrounding ground-glass in the right upper lobe. Additional areas of vague scattered ground-glass in the lungs bilaterally, also new. Post treatment parenchymal retraction and bronchiectasis in the medial and basilar right hemithorax, as before.  Small, partially loculated right pleural effusion, increased. New tiny left pleural effusion. Airway is unremarkable. Musculoskeletal: Patchy sclerosis in the right humeral head as well as manubrium and both clavicular heads. Minimal sclerosis in the superior aspect of the sternum. Patchy areas of sclerosis in the ribs on the right. Findings are unchanged from 03/18/2018. CT ABDOMEN PELVIS FINDINGS Hepatobiliary: Liver and gallbladder are unremarkable. Mild biliary ductal prominence, stable. Pancreas: Negative. Spleen: Negative. Adrenals/Urinary Tract: Adrenal glands and right kidney are unremarkable. There may be slightly decreased parenchymal attenuation in the upper left kidney on delayed imaging, similar to 03/18/2018. Ureters are decompressed. Bladder is low in volume. Stomach/Bowel: Stomach, small bowel, appendix and colon are unremarkable. Vascular/Lymphatic: Vascular structures are unremarkable.  Retroperitoneal adenopathy has improved in the interval with residual hazy densities, which are hard to accurately measure. The largest is seen in the left periaortic station, measuring approximately 1.1 x 2.4 cm (series 2, image 54), previously 3.1 x 3.2 cm. No new or additional pathologically enlarged lymph nodes. Reproductive: Uterus is visualized.  No adnexal mass. Other: No free fluid. Mesenteries and peritoneum are otherwise unremarkable. Musculoskeletal: Mixed lytic and sclerotic lesions are seen in proximal femurs, left hemipelvis and spine, as before. IMPRESSION: 1. Post treatment scarring in the right lung with resolution of thoracic adenopathy and improvement in abdominal retroperitoneal adenopathy. Osseous metastatic disease appears grossly stable. 2. Small loculated right pleural effusion, increased. New tiny left pleural effusion. 3. Enlarged pulmonic trunk, indicative of pulmonary arterial hypertension. Electronically Signed   By: Lorin Picket M.D.   On: 06/18/2018 12:01   Ct Abdomen Pelvis W  Contrast  Result Date: 06/18/2018 CLINICAL DATA:  Lung cancer metastatic to brain, bones and adrenal glands. Ongoing oral chemotherapy. Bloody diarrhea, weakness, fatigue. Slight weight loss, dysphagia. EXAM: CT CHEST, ABDOMEN, AND PELVIS WITH CONTRAST TECHNIQUE: Multidetector CT imaging of the chest, abdomen and pelvis was performed following the standard protocol during bolus administration of intravenous contrast. CONTRAST:  1110m OMNIPAQUE IOHEXOL 300 MG/ML  SOLN COMPARISON:  03/18/2018. FINDINGS: CT CHEST FINDINGS Cardiovascular: Enlarged pulmonic trunk. Vascular structures are otherwise unremarkable. Heart size normal. No pericardial effusion. Mediastinum/Nodes: No pathologically enlarged mediastinal, hilar or axillary lymph nodes. Esophagus is grossly unremarkable. Lungs/Pleura: New airspace consolidation and surrounding ground-glass in the right upper lobe. Additional areas of vague scattered ground-glass in the lungs bilaterally, also new. Post treatment parenchymal retraction and bronchiectasis in the medial and basilar right hemithorax, as before. Small, partially loculated right pleural effusion, increased. New tiny left pleural effusion. Airway is unremarkable. Musculoskeletal: Patchy sclerosis in the right humeral head as well as manubrium and both clavicular heads. Minimal sclerosis in the superior aspect of the sternum. Patchy areas of sclerosis in the ribs on the right. Findings are unchanged from 03/18/2018. CT ABDOMEN PELVIS FINDINGS Hepatobiliary: Liver and gallbladder are unremarkable. Mild biliary ductal prominence, stable. Pancreas: Negative. Spleen: Negative. Adrenals/Urinary Tract: Adrenal glands and right kidney are unremarkable. There may be slightly decreased parenchymal attenuation in the upper left kidney on delayed imaging, similar to 03/18/2018. Ureters are decompressed. Bladder is low in volume. Stomach/Bowel: Stomach, small bowel, appendix and colon are unremarkable.  Vascular/Lymphatic: Vascular structures are unremarkable. Retroperitoneal adenopathy has improved in the interval with residual hazy densities, which are hard to accurately measure. The largest is seen in the left periaortic station, measuring approximately 1.1 x 2.4 cm (series 2, image 54), previously 3.1 x 3.2 cm. No new or additional pathologically enlarged lymph nodes. Reproductive: Uterus is visualized.  No adnexal mass. Other: No free fluid. Mesenteries and peritoneum are otherwise unremarkable. Musculoskeletal: Mixed lytic and sclerotic lesions are seen in proximal femurs, left hemipelvis and spine, as before. IMPRESSION: 1. Post treatment scarring in the right lung with resolution of thoracic adenopathy and improvement in abdominal retroperitoneal adenopathy. Osseous metastatic disease appears grossly stable. 2. Small loculated right pleural effusion, increased. New tiny left pleural effusion. 3. Enlarged pulmonic trunk, indicative of pulmonary arterial hypertension. Electronically Signed   By: MLorin PicketM.D.   On: 06/18/2018 12:01    ASSESSMENT AND PLAN:  This is a very pleasant 48years old Asian female with stage IV non-small cell lung cancer, adenocarcinoma with positive EGFR mutation with deletion in exon 167diagnosed in July  2016 status post treatment with Tarceva for a total of 18 months but this was discontinued secondary to disease progression and development of T790M resistant mutation. The patient was started on treatment with Tagrisso 80 mg by mouth daily status post 24 months of treatment. She was also found to have MET amplification on repeat molecular studies. In addition to [place], the patient was a started on Xalkori 250 mg p.o. twice daily.  She has some side effect of this treatment with increasing fatigue and weakness. She had repeat CT scan of the chest, abdomen and pelvis.  I personally and independently reviewed the scans.  Her scan showed improvement of her disease  with decrease in the retroperitoneal lymphadenopathy. I recommended for the patient to continue her current treatment with Tagrisso and Xalkori but I will reduce the dose of Xalkori to 250 mg p.o. daily instead of twice daily. For the dehydration lack of appetite, I will give the patient 1 L of normal saline today. For the recent diagnosis of deep venous thrombosis of the left lower extremities, the patient will continue her current treatment with Xalkori 20 mg p.o. daily.  We will hold her treatment if she has any significant bleeding. The patient will come back for follow-up visit in 1 months for evaluation and repeat blood work. For the metastatic bone disease, she will continue her current treatment with Xgeva every 6 weeks.  She was advised to call immediately if she has any concerning symptoms in the interval. The patient voices understanding of current disease status and treatment options and is in agreement with the current care plan. All questions were answered. The patient knows to call the clinic with any problems, questions or concerns. We can certainly see the patient much sooner if necessary.  Disclaimer: This note was dictated with voice recognition software. Similar sounding words can inadvertently be transcribed and may not be corrected upon review.

## 2018-06-23 NOTE — Patient Instructions (Signed)
M?t n??c, Ng??i l?n Dehydration, Adult  M?t n??c l ti?nh tra?ng khng c ?? ch?t l?ng hay n??c trong c? th?. N xa?y ra khi quy? vi? m?t nhi?u n??c h?n l??ng ch?t l?ng qu v? u?ng vo. Ca?c c? Georges Lynch tr?ng nh? th?n, no v tim khng th? th?c hi?n ch?c n?ng m khng c m?t l???ng ch?t lo?ng phu? h??p. Mo?i ti?nh tra?ng m?t ch?t lo?ng kho?i c? th? ??u c th? d?n ??n m?t n??c. M?t n??c c th? dao ??ng t? nh? ??n n?ng. Tnh tr?ng ny c?n ???c ?i?u tr? ngay l?p t?c ?? gip ng?n khng cho tnh tr?ng n?ng h?n. Nguyn nhn g gy ra? Tnh tr?ng ny c th? do:  Nn.  Tiu ch?y.  ?? m? hi qu nhi?u, ch?ng h?n khi t?p th? d?c ho?c ti?p xc v?i mi tr??ng nng b?c.  Khng u?ng ?? n??c, ??c bi?t l: ? Khi ?m. ? Lc th?c hi?n ho?t ??ng c?n r?t nhi?u n?ng l??ng.  ?i ti?u qu nhi?u.  S?t.  Nhi?m trng.  M?t s? lo?i thu?c nh?t ??nh, ch?ng h?n nh? thu?c khi?n c? th? m?t qu nhi?u ch?t l?ng (thu?c l?i ti?u).  Khng th? ti?p c?n v?i n??c u?ng an ton.  Suy gi?m kh? n?ng c? th? l?y ?? n??c v th?c ?n. ?i?u g lm t?ng nguy c?? Tnh tr?ng ny hay x?y ra h?n ? nh?ng ng??i:  Co? b?nh ko da?i ki?m sot km (ma?n ti?nh), ch??ng ha?n ti?u ???ng, b?nh tim, ho?c b?nh th?n.  Nh?ng ng??i t? 67 tu?i tr? ln.  Nh?ng ng??i b? khuy?t t?t.  Nh?ng ng??i s?ng ? n?i c ?? cao l?n.  Nh?ng ng??i ch?i cc mn th? thao c?n s?c b?n. Cc d?u hi?u ho?c tri?u ch?ng l g? Cc tri?u ch?ng m?t n??c nh? c th? bao g?m:  Kha?t.  Kh mi.  H?i kh mi?ng.  Da kh, ?m.  Chng m?t. Cc tri?u ch?ng m?t n??c v?a ph?i c th? bao g?m:  Mi?ng r?t kh.  Co th?t c?.  N??c ti?u ??m mu. N??c ti?u c th? c mu tr.  L??ng n??c ti?u gi?m.  Gi?m ti?t n??c m?t.  Nh?p ??p c?a tim khng ??u ho?c nhanh h?n bnh th??ng (h?i h?p ?nh tr?ng ng?c).  ?au ??u.  Choa?ng va?ng, ??c bi?t l khi quy? vi? ??ng d?y t? m?t t? th? ng?i.  Ng?t x?u (ng?t). Cc tri?u ch?ng m?t n??c n?ng c th? bao g?m:  Nh?ng thay  ??i trn da, ch?ng h?n nh?: ? Da l?nh v ?m. ? Da c ??m (l?m ??m) ho?c nh?t nh?t. ? Da Enis Slipper tr? l?i bnh th??ng sau khi b? vo nh? r?i nh? ra (s?c c?ng [place]).  Nh?ng thay ??i ?? ch?t d?ch c? th?, ch?ng h?n nh?: ? V cng kht. ? Khng c n??c m?t. ? Khng th? ?? m? hi khi nhi?t ?? c? th? cao, ch?ng h?n nh? trong th?i ti?t nng. ? L??ng n??c ti?u r?t t.  Nh?ng thay ??i cc d?u hi?u sinh t?n, ch?ng h?n nh?: ? M?ch y?u. ? M?ch trn 100 nh?p m?i pht khi ng?i yn. ? Th? nhanh. ? Huy?t p th?p.  Nh?ng thay ??i khc, ch?ng h?n nh?: ? M?t tr?ng. ? L?nh tay v chn. ? L l?n. ? Km sinh l?c (ng? l?m). ? Kh th?c d?y sau khi ng?. ? S?t cn trong th?i gian ng??n. ? B?t t?nh. Ch?n ?on tnh tr?ng ny nh? th? no? B?nh ny ???c ch?n ?on d?a vo cc tri?u  ch?ng c?a qu v? v khm th?c th?Arlana Pouch nghi?m mu v n??c ti?u c th? ???c th?c hi?n ?? xc ??nh ch?n ?on. Tnh tr?ng ny ???c ?i?u tr? nh? th? no? Vi?c ?i?u tr? ti?nh tra?ng ny ty thu?c vo m??c ?? n?ng. C th? ?i?u tr? m?t n??c nh? ho?c bnh th??ng t?i nh. ?i?u tr? nn ???c b?t ??u ngay l?p t?c. Khng ch? cho ??n khi m?t n??c n?ng h?n. M?t n??c n?ng l tnh tr?ng c?p c?u v c?n ???c ?i?u tr? t?i b?nh vi?n. ?i?u tr? m?t n??c nh? c th? bao g?m:  U?ng nhi?u n??c h?n.  Thay th? mu?i v cc khong ch?t trong mu (ch?t ?i?n gi?i) m qu v? c th? ? b? m?t. ?i?u tr? m?t n??c v?a ph?i c th? bao g?m:  U?ng dung d?ch b n??c (ORS). ?y l ?? u?ng gip qu v? thay th? d?ch c?ng nh? cc ch?t ?i?n gi?i (b n??c). Qu v? c th? mua t?i cc hi?u thu?c ty v c?a hng bn l?. ?i?u tr? m?t n??c n?ng c th? bao g?m:  Ti?p n??c qua m?t ?ng IV.  Ti?p dung di?ch ?i?n gi?i qua m?t ?ng cho ?n lu?n qua m?i va? va?o da? da?y cu?a quy? vi? (?ng thng m?i-d? dy hay ?ng NG).  Kh??c phu?c b?t k? ti?nh tra?ng b?t th??ng na?o cu?a ch?t ?i?n gi?i.  ?i?u tr? nguyn nhn m?t n??c ti?m tng. Tun th? nh?ng h??ng d?n ny ? nh:   N?u ???c chuyn gia ch?m [place] s?c kh?e ch? d?n, hy u?ng ORS: ? Pha ORS theo ch? d?n trn gi. ? B?t ??u u?ng v?i l??ng nh?, kho?ng  c?c (120 mL), 5-10 pht m?t l?n. ? T?ng t? t? l??ng n??c u?ng cho ??n khi qu v? dng ?? s? l??ng theo khuy?n ngh? c?a chuyn gia ch?m [place] s?c kh?e.  U?ng ?? ?? l?ng trong ?? gi? cho n??c ti?u trong ho?c vng nh?t. N?u qu v? ???c ch? d?n u?ng ORS, hy u?ng xong ORS tr??c, sau ? b?t ??u u?ng cc ?? l?ng trong khc. U?ng cc ?? l?ng nh?: ? N??c. Khng ch? u?ng duy nh?t n??c. U?ng duy nh?t n??c c th? d?n ??n c qu t mu?i (natri) trong c? th? (h? natri huy?t ). ? Bng tuy?t. ? N??c p tri cy qu v? ? b? sung thm n??c (n??c p tri cypha long). ? ?? u?ng th? thao t calo.  Trnh: ? R??u. ? Nh?ng ?? u?ng ch?a nhi?u ???ng. Nh?ng ?? u?ng ny bao g?m ?? u?ng th? thao nhi?u calo, n??c p tri cy khng pha long v soda. ? Caffeine. ? Cc th?c ph?m c d?u m? ho?c ch?a nhi?u ch?t bo hay ???ng.  Ch? s? d?ng thu?c khng k ??n v thu?c k ??n theo ch? d?n c?a chuyn gia ch?m [place] s?c kh?e.  Khng dng natri d?ng vin nn. Dng nh? v?y c th? d?n ??n qu nhi?u natri trong c? th? (gy t?ng natri huy?t).  ?n cc th?c ph?m c cn b?ng v? ch?t ?i?n gi?i c l?i cho s?c kh?e, ch?ng h?n nh? chu?i, cam, khoai ty, c chua v rau bina.  Tun th? t?t c? cc l?n khm theo di theo ch? d?n c?a chuyn gia ch?m [place] s?c kh?e. ?i?u ny c vai tr quan tr?ng. Hy lin l?c v?i chuyn gia ch?m [place] s?c kh?e n?u:  Qu v? b? ?au b?ng: ? Tr? nn tr?m tr?ng h?n. ? T?n t?i ? m?t khu v?c (c?c b?).  Qu v? b?  pht ban.  Qu v? b? c?ng c?.  Qu v? d? b? kch ?ng h?n bnh th??ng.  Qu v? bu?n ng? h?n ho?c kh th?c d?y h?n bnh th??ng.  Qu v? c?m th?y chng m?t ho?c y?u.  Qu v? c?m th?y r?t kht.  Qu v? ch? ?i ti?u m?t l??ng n??c ti?u nh? r?t s?m mu trong th?i gian 6-8 gi?Tasia Catchings c?u tr? gip ngay l?p t?c n?u:  Qu v? c cc tri?u ch?ng m?t n??c n?ng.  Qu v? khng  th? u?ng n??c m khng b? nn.  Tri?u ch?ng c?a qu v? tr?m tr?ng h?n khi ?i?u tr?Sander Nephew v? b? s?t.  Qu v? b? ?au ??u nhi?u.  Qu v? b? nn m?a ho?c tiu ch?y: ? Tr? nn tr?m tr?ng h?n. ? Khng h?t.  Qu v? c mu ho?c ch?t mu xanh (m?t) trong ch?t nn.  Qu v? c mu trong phn. ?i?u ny c th? khi?n cho mu c mu ?en ho?c gi?ng h?c n.  Qu v? khng ?i ti?u trong vng 6-8 gi?Sander Nephew v? b? ng?t.  Nh?p tim trong lc ng?i yn c?a qu v? trn 100 nh?p m?i pht.  Qu v? b? kh th?. Thng tin ny khng nh?m m?c ?ch thay th? cho l?i khuyn m chuyn gia ch?m [place] s?c kh?e ni v?i qu v?. Hy b?o ??m qu v? ph?i th?o lu?n b?t k? v?n ?? g m qu v? c v?i chuyn gia ch?m [place] s?c kh?e c?a qu v?. Document Released: 04/25/2015 Document Revised: 04/16/2016 Document Reviewed: 02/25/2015 Elsevier Interactive Patient Education  2019 [employer].

## 2018-06-23 NOTE — Progress Notes (Signed)
Pt will not receive Delton See today per MD [facility] and pharmacy, calcium levels too low.  Pt encouraged to continue taking oral home calcium/vit D supplements.  Pt VU of instructions.  Translator present.  Pharmacy aware.

## 2018-06-23 NOTE — Progress Notes (Signed)
Letter given to pt for immigration.

## 2018-06-23 NOTE — Progress Notes (Signed)
Pt requests PT for leg weakness . Message to [place].

## 2018-06-23 NOTE — Patient Instructions (Signed)

## 2018-06-24 ENCOUNTER — Other Ambulatory Visit: Payer: Self-pay | Admitting: Medical Oncology

## 2018-06-24 ENCOUNTER — Telehealth: Payer: Self-pay | Admitting: Internal Medicine

## 2018-06-24 ENCOUNTER — Ambulatory Visit (HOSPITAL_COMMUNITY): Admission: RE | Admit: 2018-06-24 | Payer: Medicare Other | Source: Ambulatory Visit

## 2018-06-24 DIAGNOSIS — C7951 Secondary malignant neoplasm of bone: Secondary | ICD-10-CM

## 2018-06-24 DIAGNOSIS — C3431 Malignant neoplasm of lower lobe, right bronchus or lung: Secondary | ICD-10-CM

## 2018-06-24 DIAGNOSIS — C7931 Secondary malignant neoplasm of brain: Secondary | ICD-10-CM

## 2018-06-24 MED FILL — XALKORI 250 MG CAPSULE: 250 | 30 days supply | Qty: 60 | Fill #2

## 2018-06-24 MED FILL — TAGRISSO 80 MG TABLET: 80 | 30 days supply | Qty: 30 | Fill #1

## 2018-06-24 NOTE — Telephone Encounter (Signed)
Scheduled appt per 6/08 los - mailed letter with appt date and time

## 2018-06-30 ENCOUNTER — Other Ambulatory Visit: Payer: Medicare Other

## 2018-06-30 ENCOUNTER — Ambulatory Visit: Payer: Medicare Other | Admitting: Internal Medicine

## 2018-06-30 ENCOUNTER — Ambulatory Visit: Payer: Medicare Other

## 2018-07-02 ENCOUNTER — Telehealth: Payer: Self-pay | Admitting: *Deleted

## 2018-07-02 ENCOUNTER — Other Ambulatory Visit: Payer: Self-pay | Admitting: Internal Medicine

## 2018-07-02 NOTE — Telephone Encounter (Signed)
She can come to see Cassie Friday morning around 9:00 or 9:30 AM.

## 2018-07-02 NOTE — Telephone Encounter (Signed)
Received call from patient. She states she does feel well at all. Specifically, she is having increased pain in her back and l side of her abdomen. "The pain is worse, pain medicine is not helping'.  She has tramadol for pain.  She also c/o left foot swelling, poor appetite and not sleeping well d/t pain. Denies fever, chills, n/v/d. C/o some constipation but "not too bad" She is asking to be seen again. Last OV was 06/23/18.  Please advise.

## 2018-07-03 ENCOUNTER — Telehealth: Payer: Self-pay | Admitting: Internal Medicine

## 2018-07-03 MED FILL — levETIRAcetam 500 MG TABS: 500 | 14 days supply | Qty: 56 | Fill #0

## 2018-07-03 NOTE — Telephone Encounter (Signed)
Spoke with patient via [employer] [place] [phone] re 6/19 f/u with [facility].

## 2018-07-04 ENCOUNTER — Telehealth: Payer: Self-pay | Admitting: Physician Assistant

## 2018-07-04 ENCOUNTER — Other Ambulatory Visit: Payer: Self-pay

## 2018-07-04 ENCOUNTER — Encounter: Payer: Self-pay | Admitting: Physician Assistant

## 2018-07-04 ENCOUNTER — Ambulatory Visit: Payer: Medicare Other

## 2018-07-04 ENCOUNTER — Inpatient Hospital Stay: Payer: Medicare Other

## 2018-07-04 ENCOUNTER — Inpatient Hospital Stay (HOSPITAL_BASED_OUTPATIENT_CLINIC_OR_DEPARTMENT_OTHER): Payer: Medicare Other | Admitting: Physician Assistant

## 2018-07-04 VITALS — BP 90/57 | HR 102 | Temp 97.7°F | Resp 18 | Ht 59.0 in | Wt 99.4 lb

## 2018-07-04 DIAGNOSIS — Z7901 Long term (current) use of anticoagulants: Secondary | ICD-10-CM

## 2018-07-04 DIAGNOSIS — M546 Pain in thoracic spine: Secondary | ICD-10-CM

## 2018-07-04 DIAGNOSIS — C7931 Secondary malignant neoplasm of brain: Secondary | ICD-10-CM | POA: Diagnosis not present

## 2018-07-04 DIAGNOSIS — E860 Dehydration: Secondary | ICD-10-CM

## 2018-07-04 DIAGNOSIS — C7972 Secondary malignant neoplasm of left adrenal gland: Secondary | ICD-10-CM

## 2018-07-04 DIAGNOSIS — R5383 Other fatigue: Secondary | ICD-10-CM

## 2018-07-04 DIAGNOSIS — Z79899 Other long term (current) drug therapy: Secondary | ICD-10-CM

## 2018-07-04 DIAGNOSIS — C7951 Secondary malignant neoplasm of bone: Secondary | ICD-10-CM

## 2018-07-04 DIAGNOSIS — R634 Abnormal weight loss: Secondary | ICD-10-CM

## 2018-07-04 DIAGNOSIS — Z86718 Personal history of other venous thrombosis and embolism: Secondary | ICD-10-CM

## 2018-07-04 DIAGNOSIS — C3491 Malignant neoplasm of unspecified part of right bronchus or lung: Secondary | ICD-10-CM

## 2018-07-04 DIAGNOSIS — Z7951 Long term (current) use of inhaled steroids: Secondary | ICD-10-CM

## 2018-07-04 DIAGNOSIS — R630 Anorexia: Secondary | ICD-10-CM

## 2018-07-04 DIAGNOSIS — C342 Malignant neoplasm of middle lobe, bronchus or lung: Secondary | ICD-10-CM | POA: Diagnosis not present

## 2018-07-04 DIAGNOSIS — M542 Cervicalgia: Secondary | ICD-10-CM

## 2018-07-04 DIAGNOSIS — G4700 Insomnia, unspecified: Secondary | ICD-10-CM

## 2018-07-04 DIAGNOSIS — R531 Weakness: Secondary | ICD-10-CM

## 2018-07-04 DIAGNOSIS — R251 Tremor, unspecified: Secondary | ICD-10-CM

## 2018-07-04 DIAGNOSIS — M25512 Pain in left shoulder: Secondary | ICD-10-CM

## 2018-07-04 DIAGNOSIS — Z923 Personal history of irradiation: Secondary | ICD-10-CM

## 2018-07-04 MED ORDER — MORPHINE SULFATE 4 MG/ML IJ SOLN
2.0000 mg | Freq: Once | INTRAMUSCULAR | Status: AC
  Administered 2018-07-04: 2 mg via INTRAVENOUS
  Filled 2018-07-04: qty 1

## 2018-07-04 MED ORDER — MORPHINE SULFATE (PF) 4 MG/ML IV SOLN
INTRAVENOUS | Status: AC
  Filled 2018-07-04: qty 1

## 2018-07-04 MED ORDER — OXYCODONE-ACETAMINOPHEN 5-325 MG PO TABS: 1.0000 | ORAL_TABLET | Freq: Four times a day (QID) | ORAL | 0 refills | Status: DC | PRN

## 2018-07-04 MED ORDER — SODIUM CHLORIDE 0.9 % IV SOLN
Freq: Once | INTRAVENOUS | Status: AC
  Administered 2018-07-04: 11:00:00 via INTRAVENOUS
  Filled 2018-07-04: qty 250

## 2018-07-04 MED ORDER — ONDANSETRON HCL 4 MG/2ML IJ SOLN
INTRAMUSCULAR | Status: AC
  Filled 2018-07-04: qty 4

## 2018-07-04 MED ORDER — ONDANSETRON HCL 4 MG/2ML IJ SOLN
8.0000 mg | Freq: Once | INTRAMUSCULAR | Status: AC
  Administered 2018-07-04: 8 mg via INTRAVENOUS

## 2018-07-04 MED FILL — OXYCODONE-ACETAMINOPHEN 5-3: 5-325 | 15 days supply | Qty: 60 | Fill #0

## 2018-07-04 NOTE — Telephone Encounter (Signed)
Per 6/19 los Keep appointments as scheduled.

## 2018-07-04 NOTE — Progress Notes (Signed)
[place] OFFICE PROGRESS NOTE  [name], MD [place] [address]  DIAGNOSIS:  1) Stage IV (T2a, N1, M1b) non-small cell lung cancer, adenocarcinoma with positive EGFR mutation (deletion 48) presented with right middle lobe lung mass, right hilar adenopathy as well as metastatic disease to the bone, brain and left adrenal diagnosed in July 2016. 2) deep venous thrombosis of the left lower extremity diagnosed in March 2020  PRIOR THERAPY:   1) palliative radiotherapy to the brain as well as metastatic bone lesions in the pelvis. 2) Tarceva 150 mg by mouth daily started on 09/25/2014. Status post 5 months of treatment. 3) status post palliative radiotherapy to the right middle lobe lung mass for treatment of hemoptysis. 4) Tarceva 100 mg by mouth daily started 02/19/2015 status post 13 months of treatment. This was discontinued in May 2018 after the patient had disease progression and development of T790M resistant mutation.  5) palliative stereotactic radiotherapy to the left adrenal gland metastatic lesion under the care of Dr. Lisbeth Renshaw completed on April 10, 2017.  CURRENT THERAPY: 1) Tagrisso 80 mg by mouth daily started 06/02/2016.  Status post 25 months of treatment. 2) Xgeva 120 g subcutaneously every 6 weeks. 3) Xalkori 250 mg p.o. twice daily started 05/08/2018.   INTERVAL HISTORY: Deborah Foster 48 y.o. female returns to the clinic for a follow up visit with her interpretor. The patient has not been feeling well recently. Since starting her Hulda Humphrey, the patient states that she has developed significant pain. She localizes her pain to her left anterior chest, thoracic spine, left shoulder/neck, and the left paravertebral region. The patient describes her pain as soreness and tenderness. The patient denies any recent traumas, injuries, or frequent coughing. Her pain radiates from her "anterior left chest to her left posterior spine up to her  neck and down to her lower back". Her pain does not radiate down her leg. The pain is constant but worse at night time, often interferring with her ability to sleep. The pain is not exacerbated by any certain activities such as eating, coughing, movement, etc. In attempt to alleviate her pain, her mother has tried to massage her with minimal relief. She had taken tramadol last week but states it was not helping so she stopped taking it. She has not tried any other pain management interventions since this time. She denies any associated symptoms accompanying her pain. She denies any fevers, nausea, vomiting, shortness of breath, numbness/weakness/tingling in her extremities, urinary retention, urinary incontinence, diarrhea, constipation, or fecal incontinence.   She also endorses insomnia secondary to financial stress and pain, decreased oral intake, and "shaking". The patient had previously been in contact with our nutritional services but she states that she cannot afford a visit with them. She knows the importance of maintaining her weight so she "forces herself to eat". She drinks 2 bottles of ensure daily. Additionally, her family tries to make her  protein shakes. She drinks about 4 glasses of water daily. Regarding the shaking, the patient states this only occurs when she stops taking her Keppra. However, she is very adamant that she never had these shaking episodes prior to taking Keppra. She is unsure if this shaking is localized to one region of her body or not. The patient is here for evaluation and for recommendations regarding her condition.   MEDICAL HISTORY: Past Medical History:  Diagnosis Date  . Bone metastasis ([place])   . Hemoptysis   . Hypokalemia   .  lung ca dx'd 07/2014  . Lung mass   . Metastasis to adrenal gland ([place])   . Metastasis to brain ([place])   . Pneumonia   . Radiation 08/23/14-09/07/14   Brain/chest and left hip 30 Gy 12 Fx  . URI (upper respiratory infection) 02-02-2015     ALLERGIES:  is allergic to other and doxycycline.  MEDICATIONS:  Current Outpatient Medications  Medication Sig Dispense Refill  . Calcium Carbonate-Vitamin D (CALCIUM 500 + D) 500-125 MG-UNIT TABS Take 500 mg by mouth 2 (two) times daily.    . crizotinib (XALKORI) 250 MG capsule Take 1 capsule (250 mg total) by mouth 2 (two) times daily. 60 capsule 2  . Cyanocobalamin (VITAMIN B-12) 2500 MCG SUBL Place 2,500 mcg under the tongue daily.    Marland Kitchen ENSURE (ENSURE) Take 1 Can by mouth 2 (two) times daily between meals.     . ferrous sulfate 325 (65 FE) MG EC tablet Take 325 mg by mouth daily with breakfast.    . fluticasone (VERAMYST) 27.5 MCG/SPRAY nasal spray Place 2 sprays into the nose daily.    . folic acid (FOLVITE) 474 MCG tablet Take 1 tablet (800 mcg total) by mouth daily. 30 tablet 1  . levETIRAcetam (KEPPRA) 500 MG tablet TAKE 2 TABLETS (1,000 MG TOTAL) BY MOUTH 2 TIMES DAILY FOR 14 DAYS. 56 tablet 0  . loratadine (CLARITIN) 10 MG tablet Take 1 tablet (10 mg total) by mouth daily. 30 tablet 0  . prochlorperazine (COMPAZINE) 10 MG tablet TAKE 1 TABLET BY MOUTH EVERY 6 HOURS AS NEEDED FOR NAUSEA OR VOMITING. 30 tablet 0  . TAGRISSO 80 MG tablet TAKE 1 TABLET (80 MG TOTAL) BY MOUTH DAILY. 30 tablet 11  . AMBULATORY NON FORMULARY MEDICATION Medication Name: MAGIC MOUTHWASH 2 % Viscous Lidocaine  Maalox Benadryl Susp.  Disp: 1:1:1 289m bottle Sig: 181mPO Swish & Spit  q 3-4hrs. PRN mouth sores (Patient not taking: Reported on 07/29/2017) 200 mL 3  . benzonatate (TESSALON) 200 MG capsule TK ONE C PO Q 8 H PRN COU  0  . chlorpheniramine-HYDROcodone (TUSSIONEX) 10-8 MG/5ML SUER Take 5 mLs by mouth every 12 (twelve) hours as needed. (Patient not taking: Reported on 04/08/2018) 140 mL 0  . clindamycin (CLINDAGEL) 1 % gel Apply 1 application topically 2 (two) times daily.    . clobetasol cream (TEMOVATE) 0.2.59 Apply 1 application topically 2 (two) times daily.    . Marland Kitchenextromethorphan  Polistirex (DELSYM PO) Take by mouth.    . mirtazapine (REMERON) 15 MG tablet Take 1 tablet (15 mg total) by mouth at bedtime. (Patient not taking: Reported on 07/04/2018) 30 tablet 0  . Nutritional Supplements (JUICE PLUS FIBRE PO) Take 1 capsule by mouth daily. Reported on 07/28/2015    . ondansetron (ZOFRAN) 8 MG tablet TAKE 1 TABLET (8 MG TOTAL) BY MOUTH EVERY 8 HOURS AS NEEDED FOR NAUSEA OR VOMITING. (Patient not taking: Reported on 07/04/2018) 30 tablet 0  . OVER THE COUNTER MEDICATION "Deep penetrating pain relief oil"    . oxyCODONE-acetaminophen (PERCOCET/ROXICET) 5-325 MG tablet Take 1 tablet by mouth every 6 (six) hours as needed for severe pain. 60 tablet 0  . ranitidine (ZANTAC) 300 MG tablet Take 1 tablet (300 mg total) by mouth at bedtime. (Patient not taking: Reported on 05/19/2018) 30 tablet 6  . traMADol (ULTRAM) 50 MG tablet TAKE 1 TO 2 TABLETS BY MOUTH EVERY 4 HOURS AS NEEDED FOR PAIN FOR PAIN(MAX 8 TABS IN A DAY)    .  TURMERIC PO Take 1 capsule by mouth 2 (two) times daily. Turmeric Superior 750 mg po bid    . XALKORI 250 MG capsule     . XARELTO 20 MG TABS tablet TAKE 1 TABLET (20 MG TOTAL) BY MOUTH DAILY WITH SUPPER. 30 tablet 1   No current facility-administered medications for this visit.     SURGICAL HISTORY:  Past Surgical History:  Procedure Laterality Date  . IR GENERIC HISTORICAL  07/14/2015   IR RADIOLOGIST EVAL & MGMT 07/14/2015 Greggory Keen, MD [facility] INTERV RAD  . VIDEO BRONCHOSCOPY Bilateral 08/09/2014   Procedure: VIDEO BRONCHOSCOPY WITHOUT FLUORO;  Surgeon: [name], MD;  Location: [facility] ENDOSCOPY;  Service: Cardiopulmonary;  Laterality: Bilateral;  . VIDEO BRONCHOSCOPY Bilateral 07/06/2015   Procedure: VIDEO BRONCHOSCOPY WITHOUT FLUORO;  Surgeon: [name], MD;  Location: [facility] ENDOSCOPY;  Service: Cardiopulmonary;  Laterality: Bilateral;    REVIEW OF SYSTEMS:   Review of Systems  Constitutional: Positive for appetite change and fatigue. Negative  for chills, fever and unexpected weight change.  HENT:   Negative for mouth sores, nosebleeds, sore throat and trouble swallowing.   Eyes: Negative for eye problems and icterus.  Respiratory: Negative for cough, hemoptysis, shortness of breath and wheezing.   Cardiovascular: Positive for left anterior chest wall tenderness. Positive for occasional left lower leg swelling (recent history of left DVT-on anticoagulation)  Gastrointestinal: Negative for abdominal pain, constipation, diarrhea, nausea and vomiting.  Genitourinary: Negative for bladder incontinence, difficulty urinating, dysuria, frequency and hematuria.   Musculoskeletal: Positive for left thoracic, lumbar, left shoulder, and left paravertebral muscle pain. Negative for gait problem and neck stiffness.  Skin: Negative for itching and rash.  Neurological: Negative for dizziness, extremity weakness, gait problem, headaches, light-headedness and seizures.  Hematological: Negative for adenopathy. Does not bruise/bleed easily.  Psychiatric/Behavioral: Positive for insomnia. Negative for confusion. The patient is not nervous/anxious.     PHYSICAL EXAMINATION:  Blood pressure 104/60, pulse (!) 120, temperature 98.5 F (36.9 C), temperature source Oral, resp. rate 18, height '4\' 11"'$  (1.499 m), weight 99 lb 6.4 oz (45.1 kg), SpO2 100 %.  ECOG PERFORMANCE STATUS: 1 - Symptomatic but completely ambulatory  Physical Exam  Constitutional: Oriented to person, place, and time and  and in no distress.  HENT:  Head: Normocephalic and atraumatic.  Mouth/Throat: Oropharynx is clear and moist. No oropharyngeal exudate.  Eyes: Conjunctivae are normal. Right eye exhibits no discharge. Left eye exhibits no discharge. No scleral icterus.  Neck: Normal range of motion. Neck supple.  Cardiovascular: Tenderness to palpation of her left anterior chest wall. Normal rate, regular rhythm, normal heart sounds and intact distal pulses.   Pulmonary/Chest: Effort  normal and breath sounds normal. No respiratory distress. No wheezes. No rales.  Abdominal: Tenderness to palpation with light palpation of her LUQ. Bowel sounds are normal. Exhibits no distension and no mass.  Musculoskeletal: Tenderness to palpation of her thoracic spine and left paravertebral muscles. Unable to assess full range of motion secondary to discomfort. Exhibits no edema.  Lymphadenopathy:    No cervical adenopathy.  Neurological: Alert and oriented to person, place, and time. Exhibits normal muscle tone. Gait normal. Coordination normal.  Skin: Skin is warm and dry. No rash noted. Not diaphoretic. No erythema. No pallor.  Psychiatric: Mood, memory and judgment normal.  Vitals reviewed.  LABORATORY DATA: Lab Results  Component Value Date   WBC 4.2 06/23/2018   HGB 10.1 (L) 06/23/2018   HCT 31.1 (L) 06/23/2018   MCV 96.6  06/23/2018   PLT 218 06/23/2018      Chemistry      Component Value Date/Time   NA 131 (L) 06/23/2018 1249   NA 140 01/17/2017 1003   K 4.3 06/23/2018 1249   K 3.9 01/17/2017 1003   CL 98 06/23/2018 1249   CO2 26 06/23/2018 1249   CO2 29 01/17/2017 1003   BUN 12 06/23/2018 1249   BUN 14.8 01/17/2017 1003   CREATININE 0.59 06/23/2018 1249   CREATININE 0.8 01/17/2017 1003      Component Value Date/Time   CALCIUM 7.3 (L) 06/23/2018 1249   CALCIUM 8.7 01/17/2017 1003   ALKPHOS 187 (H) 06/23/2018 1249   ALKPHOS 50 01/17/2017 1003   AST 44 (H) 06/23/2018 1249   AST 23 01/17/2017 1003   ALT 83 (H) 06/23/2018 1249   ALT 27 01/17/2017 1003   BILITOT 0.3 06/23/2018 1249   BILITOT 0.46 01/17/2017 1003       RADIOGRAPHIC STUDIES:  Ct Chest W Contrast  Result Date: 06/18/2018 CLINICAL DATA:  Lung cancer metastatic to brain, bones and adrenal glands. Ongoing oral chemotherapy. Bloody diarrhea, weakness, fatigue. Slight weight loss, dysphagia. EXAM: CT CHEST, ABDOMEN, AND PELVIS WITH CONTRAST TECHNIQUE: Multidetector CT imaging of the chest, abdomen  and pelvis was performed following the standard protocol during bolus administration of intravenous contrast. CONTRAST:  184m OMNIPAQUE IOHEXOL 300 MG/ML  SOLN COMPARISON:  03/18/2018. FINDINGS: CT CHEST FINDINGS Cardiovascular: Enlarged pulmonic trunk. Vascular structures are otherwise unremarkable. Heart size normal. No pericardial effusion. Mediastinum/Nodes: No pathologically enlarged mediastinal, hilar or axillary lymph nodes. Esophagus is grossly unremarkable. Lungs/Pleura: New airspace consolidation and surrounding ground-glass in the right upper lobe. Additional areas of vague scattered ground-glass in the lungs bilaterally, also new. Post treatment parenchymal retraction and bronchiectasis in the medial and basilar right hemithorax, as before. Small, partially loculated right pleural effusion, increased. New tiny left pleural effusion. Airway is unremarkable. Musculoskeletal: Patchy sclerosis in the right humeral head as well as manubrium and both clavicular heads. Minimal sclerosis in the superior aspect of the sternum. Patchy areas of sclerosis in the ribs on the right. Findings are unchanged from 03/18/2018. CT ABDOMEN PELVIS FINDINGS Hepatobiliary: Liver and gallbladder are unremarkable. Mild biliary ductal prominence, stable. Pancreas: Negative. Spleen: Negative. Adrenals/Urinary Tract: Adrenal glands and right kidney are unremarkable. There may be slightly decreased parenchymal attenuation in the upper left kidney on delayed imaging, similar to 03/18/2018. Ureters are decompressed. Bladder is low in volume. Stomach/Bowel: Stomach, small bowel, appendix and colon are unremarkable. Vascular/Lymphatic: Vascular structures are unremarkable. Retroperitoneal adenopathy has improved in the interval with residual hazy densities, which are hard to accurately measure. The largest is seen in the left periaortic station, measuring approximately 1.1 x 2.4 cm (series 2, image 54), previously 3.1 x 3.2 cm. No new  or additional pathologically enlarged lymph nodes. Reproductive: Uterus is visualized.  No adnexal mass. Other: No free fluid. Mesenteries and peritoneum are otherwise unremarkable. Musculoskeletal: Mixed lytic and sclerotic lesions are seen in proximal femurs, left hemipelvis and spine, as before. IMPRESSION: 1. Post treatment scarring in the right lung with resolution of thoracic adenopathy and improvement in abdominal retroperitoneal adenopathy. Osseous metastatic disease appears grossly stable. 2. Small loculated right pleural effusion, increased. New tiny left pleural effusion. 3. Enlarged pulmonic trunk, indicative of pulmonary arterial hypertension. Electronically Signed   By: MLorin PicketM.D.   On: 06/18/2018 12:01   Ct Abdomen Pelvis W Contrast  Result Date: 06/18/2018 CLINICAL DATA:  Lung cancer metastatic  to brain, bones and adrenal glands. Ongoing oral chemotherapy. Bloody diarrhea, weakness, fatigue. Slight weight loss, dysphagia. EXAM: CT CHEST, ABDOMEN, AND PELVIS WITH CONTRAST TECHNIQUE: Multidetector CT imaging of the chest, abdomen and pelvis was performed following the standard protocol during bolus administration of intravenous contrast. CONTRAST:  162m OMNIPAQUE IOHEXOL 300 MG/ML  SOLN COMPARISON:  03/18/2018. FINDINGS: CT CHEST FINDINGS Cardiovascular: Enlarged pulmonic trunk. Vascular structures are otherwise unremarkable. Heart size normal. No pericardial effusion. Mediastinum/Nodes: No pathologically enlarged mediastinal, hilar or axillary lymph nodes. Esophagus is grossly unremarkable. Lungs/Pleura: New airspace consolidation and surrounding ground-glass in the right upper lobe. Additional areas of vague scattered ground-glass in the lungs bilaterally, also new. Post treatment parenchymal retraction and bronchiectasis in the medial and basilar right hemithorax, as before. Small, partially loculated right pleural effusion, increased. New tiny left pleural effusion. Airway is  unremarkable. Musculoskeletal: Patchy sclerosis in the right humeral head as well as manubrium and both clavicular heads. Minimal sclerosis in the superior aspect of the sternum. Patchy areas of sclerosis in the ribs on the right. Findings are unchanged from 03/18/2018. CT ABDOMEN PELVIS FINDINGS Hepatobiliary: Liver and gallbladder are unremarkable. Mild biliary ductal prominence, stable. Pancreas: Negative. Spleen: Negative. Adrenals/Urinary Tract: Adrenal glands and right kidney are unremarkable. There may be slightly decreased parenchymal attenuation in the upper left kidney on delayed imaging, similar to 03/18/2018. Ureters are decompressed. Bladder is low in volume. Stomach/Bowel: Stomach, small bowel, appendix and colon are unremarkable. Vascular/Lymphatic: Vascular structures are unremarkable. Retroperitoneal adenopathy has improved in the interval with residual hazy densities, which are hard to accurately measure. The largest is seen in the left periaortic station, measuring approximately 1.1 x 2.4 cm (series 2, image 54), previously 3.1 x 3.2 cm. No new or additional pathologically enlarged lymph nodes. Reproductive: Uterus is visualized.  No adnexal mass. Other: No free fluid. Mesenteries and peritoneum are otherwise unremarkable. Musculoskeletal: Mixed lytic and sclerotic lesions are seen in proximal femurs, left hemipelvis and spine, as before. IMPRESSION: 1. Post treatment scarring in the right lung with resolution of thoracic adenopathy and improvement in abdominal retroperitoneal adenopathy. Osseous metastatic disease appears grossly stable. 2. Small loculated right pleural effusion, increased. New tiny left pleural effusion. 3. Enlarged pulmonic trunk, indicative of pulmonary arterial hypertension. Electronically Signed   By: MLorin PicketM.D.   On: 06/18/2018 12:01     ASSESSMENT/PLAN:  This is a very pleasant 48year old Asian female with stage IV non-small cell lung cancer,  adenocarcinoma.  She has a positive EGFR mutation with deletion in exon 175  She was diagnosed in July 2016.  She is status post 18 months of treatment with Tarceva.  This was discontinued secondary to evidence of disease progression with the development of a T790M resistant mutation.  She was then started on treatment with Tagrisso 80 mg by mouth.  Status post 25 months of treatment.   She was found to have a MET amplification on repeat molecular studies.  She was then started on Xalkori 250 mg p.o. twice daily in addition to Tagrisso 80 mg p.o. daily.   The patient presented to the clinic today with significant rib and thoracolumbar pain. She also has been experiencing insomnia secondary to pain and poor nutritional intake.  I will arrange for the patient to have a MRI of the thoracic and lumbar spine in the next few days to further assess her pain. If there is evidence of metastatic bone disease that can be managed by palliative radiation, we will refer the patient  to radiation oncology.    Additionally, the patient will receive a liter of normal saline today due to her poor oral intake.  She will also receive 2 mg of morphine sulfate IV today. She will also receive 8 mg of Zofran.   I have sent a prescription for Percocet to the patient's pharmacy for her cancer related pain.  The patient will continue to take calcium supplementation for hypocalcemia.  She was unable to receive her Xgeva injection in the past due to hypocalcemia.   The patient will continue to drink protein shakes and drink 2 bottles of Ensure daily.  We will see her back for a follow up visit on 08/11/2018 for evaluation and repeat lab work.   The patient was advised to call immediately if she has any concerning symptoms in the interval. The patient voices understanding of current disease status and treatment options and is in agreement with the current care plan. All questions were answered. The patient knows to call the  clinic with any problems, questions or concerns. We can certainly see the patient much sooner if necessary    Orders Placed This Encounter  Procedures  . MR THORACIC SPINE W WO CONTRAST    Standing Status:   Future    Standing Expiration Date:   09/03/2019    Order Specific Question:   ** REASON FOR EXAM (FREE TEXT)    Answer:   Severe back pain with history of metastatic lung cancer    Order Specific Question:   GRA to provide read?    Answer:   Yes    Order Specific Question:   If indicated for the ordered procedure, I authorize the administration of contrast media per Radiology protocol    Answer:   Yes    Order Specific Question:   What is the patient's sedation requirement?    Answer:   No Sedation    Order Specific Question:   Use SRS Protocol?    Answer:   No    Order Specific Question:   Does the patient have a pacemaker or implanted devices?    Answer:   No    Order Specific Question:   Preferred imaging location?    Answer:   [facility] (table limit-350 lbs)    Order Specific Question:   Radiology Contrast Protocol - do NOT remove file path    Answer:   \\[email or web address]  . MR Lumbar Spine W Wo Contrast    Standing Status:   Future    Standing Expiration Date:   09/03/2019    Order Specific Question:   ** REASON FOR EXAM (FREE TEXT)    Answer:   Severe back pain with history of lung cancer    Order Specific Question:   If indicated for the ordered procedure, I authorize the administration of contrast media per Radiology protocol    Answer:   Yes    Order Specific Question:   What is the patient's sedation requirement?    Answer:   No Sedation    Order Specific Question:   Does the patient have a pacemaker or implanted devices?    Answer:   No    Order Specific Question:   Use SRS Protocol?    Answer:   No    Order Specific Question:   Radiology Contrast Protocol - do NOT remove file path    Answer:    \\[email or web address]    Order Specific Question:   Preferred imaging location?  Answer:   [facility] (table limit-350 lbs)     Cassandra L Heilingoetter, PA-C 07/04/18  ADDENDUM: Hematology/Oncology Attending: I had a face-to-face encounter with the patient.  I recommended her care plan.  This is a very pleasant 48 years old Asian female with a stage IV non-small cell lung cancer with EGFR mutation diagnosed in July 2016 and has been on treatment with several targeted EGFR tyrosine kinase inhibitors.  Most recently she was found to have MET amplification in addition to the EGFR mutation.  She is currently on treatment with Tagrisso in addition to Xalkori 250 mg p.o. today.  The patient has been complaining of increasing fatigue and weakness as well as intermittent nausea and vomiting as well as pain in the mid back with radiation to under her breast more on the left side. She lost few pounds since her last visit. I recommended for the patient to have MRI of the thoracolumbar spine to rule out any progressive disease in the spine responsible for her pain.  She does not have any other neurological symptoms at this point and no incontinence of her urine or stool. For the dehydration and nausea we will arrange for the patient to receive 1 L of normal saline with Zofran today. For pain management she will receive morphine sulfate 2 mg IV in the clinic today.  We will also give the patient prescription for Percocet on as-needed basis. We will see her back for follow-up visit after the MRI for further evaluation and recommendation regarding her condition. The patient was advised to call immediately if she has any other concerning symptoms in the interval.  Disclaimer: This note was dictated with voice recognition software. Similar sounding words can inadvertently be transcribed and may be missed upon review. Eilleen Kempf, MD 07/05/18

## 2018-07-04 NOTE — Patient Instructions (Signed)
Dehydration, Adult  Dehydration is a condition in which there is not enough fluid or water in the body. This happens when you lose more fluids than you take in. Important organs, such as the kidneys, brain, and heart, cannot function without a proper amount of fluids. Any loss of fluids from the body can lead to dehydration. Dehydration can range from mild to severe. This condition should be treated right away to prevent it from becoming severe. What are the causes? This condition may be caused by:  Vomiting.  Diarrhea.  Excessive sweating, such as from heat exposure or exercise.  Not drinking enough fluid, especially: ? When ill. ? While doing activity that requires a lot of energy.  Excessive urination.  Fever.  Infection.  Certain medicines, such as medicines that cause the body to lose excess fluid (diuretics).  Inability to access safe drinking water.  Reduced physical ability to get adequate water and food. What increases the risk? This condition is more likely to develop in people:  Who have a poorly controlled long-term (chronic) illness, such as diabetes, heart disease, or kidney disease.  Who are age 65 or older.  Who are disabled.  Who live in a place with high altitude.  Who play endurance sports. What are the signs or symptoms? Symptoms of mild dehydration may include:  Thirst.  Dry lips.  Slightly dry mouth.  Dry, warm skin.  Dizziness. Symptoms of moderate dehydration may include:  Very dry mouth.  Muscle cramps.  Dark urine. Urine may be the color of tea.  Decreased urine production.  Decreased tear production.  Heartbeat that is irregular or faster than normal (palpitations).  Headache.  Light-headedness, especially when you stand up from a sitting position.  Fainting (syncope). Symptoms of severe dehydration may include:  Changes in skin, such as: ? Cold and clammy skin. ? Blotchy (mottled) or pale skin. ? Skin that does  not quickly return to normal after being lightly pinched and released (poor skin turgor).  Changes in body fluids, such as: ? Extreme thirst. ? No tear production. ? Inability to sweat when body temperature is high, such as in hot weather. ? Very little urine production.  Changes in vital signs, such as: ? Weak pulse. ? Pulse that is more than 100 beats a minute when sitting still. ? Rapid breathing. ? Low blood pressure.  Other changes, such as: ? Sunken eyes. ? Cold hands and feet. ? Confusion. ? Lack of energy (lethargy). ? Difficulty waking up from sleep. ? Short-term weight loss. ? Unconsciousness. How is this diagnosed? This condition is diagnosed based on your symptoms and a physical exam. Blood and urine tests may be done to help confirm the diagnosis. How is this treated? Treatment for this condition depends on the severity. Mild or moderate dehydration can often be treated at home. Treatment should be started right away. Do not wait until dehydration becomes severe. Severe dehydration is an emergency and it needs to be treated in a hospital. Treatment for mild dehydration may include:  Drinking more fluids.  Replacing salts and minerals in your blood (electrolytes) that you may have lost. Treatment for moderate dehydration may include:  Drinking an oral rehydration solution (ORS). This is a drink that helps you replace fluids and electrolytes (rehydrate). It can be found at pharmacies and retail stores. Treatment for severe dehydration may include:  Receiving fluids through an IV tube.  Receiving an electrolyte solution through a feeding tube that is passed through your nose and   into your stomach (nasogastric tube, or NG tube).  Correcting any abnormalities in electrolytes.  Treating the underlying cause of dehydration. Follow these instructions at home:  If directed by your health care provider, drink an ORS: ? Make an ORS by following instructions on the  package. ? Start by drinking small amounts, about  cup (120 mL) every 5-10 minutes. ? Slowly increase how much you drink until you have taken the amount recommended by your health care provider.  Drink enough clear fluid to keep your urine clear or pale yellow. If you were told to drink an ORS, finish the ORS first, then start slowly drinking other clear fluids. Drink fluids such as: ? Water. Do not drink only water. Doing that can lead to having too little salt (sodium) in the body (hyponatremia). ? Ice chips. ? Fruit juice that you have added water to (diluted fruit juice). ? Low-calorie sports drinks.  Avoid: ? Alcohol. ? Drinks that contain a lot of sugar. These include high-calorie sports drinks, fruit juice that is not diluted, and soda. ? Caffeine. ? Foods that are greasy or contain a lot of fat or sugar.  Take over-the-counter and prescription medicines only as told by your health care provider.  Do not take sodium tablets. This can lead to having too much sodium in the body (hypernatremia).  Eat foods that contain a healthy balance of electrolytes, such as bananas, oranges, potatoes, tomatoes, and spinach.  Keep all follow-up visits as told by your health care provider. This is important. Contact a health care provider if:  You have abdominal pain that: ? Gets worse. ? Stays in one area (localizes).  You have a rash.  You have a stiff neck.  You are more irritable than usual.  You are sleepier or more difficult to wake up than usual.  You feel weak or dizzy.  You feel very thirsty.  You have urinated only a small amount of very dark urine over 6-8 hours. Get help right away if:  You have symptoms of severe dehydration.  You cannot drink fluids without vomiting.  Your symptoms get worse with treatment.  You have a fever.  You have a severe headache.  You have vomiting or diarrhea that: ? Gets worse. ? Does not go away.  You have blood or green matter  (bile) in your vomit.  You have blood in your stool. This may cause stool to look black and tarry.  You have not urinated in 6-8 hours.  You faint.  Your heart rate while sitting still is over 100 beats a minute.  You have trouble breathing. This information is not intended to replace advice given to you by your health care provider. Make sure you discuss any questions you have with your health care provider. Document Released: 01/01/2005 Document Revised: 07/29/2015 Document Reviewed: 02/25/2015 [employer]vier Interactive Patient Education  2019 [employer]  

## 2018-07-07 ENCOUNTER — Ambulatory Visit (HOSPITAL_COMMUNITY)
Admission: RE | Admit: 2018-07-07 | Discharge: 2018-07-07 | Disposition: A | Discharge status: Home / Self Care | Payer: Medicare Other | Source: Ambulatory Visit | Attending: Physician Assistant | Admitting: Physician Assistant

## 2018-07-07 ENCOUNTER — Other Ambulatory Visit: Payer: Self-pay

## 2018-07-07 DIAGNOSIS — C3491 Malignant neoplasm of unspecified part of right bronchus or lung: Secondary | ICD-10-CM

## 2018-07-07 MED ORDER — GADOBUTROL 1 MMOL/ML IV SOLN
4.0000 mL | Freq: Once | INTRAVENOUS | Status: AC | PRN
  Administered 2018-07-07: 4 mL via INTRAVENOUS

## 2018-07-07 MED FILL — levETIRAcetam 500 MG TABS: 500 | 14 days supply | Qty: 56 | Fill #0

## 2018-07-10 ENCOUNTER — Telehealth: Payer: Self-pay | Admitting: Internal Medicine

## 2018-07-10 ENCOUNTER — Telehealth: Payer: Self-pay | Admitting: *Deleted

## 2018-07-10 NOTE — Telephone Encounter (Signed)
Scheduled appt per 6/25 sch message - called pt with interpreter - pt is aware of date and time

## 2018-07-10 NOTE — Telephone Encounter (Signed)
[name]  Clinical Social Work received call from patient requesting help understanding medical bills.  Patient reports she has many new medical bills, and no means to pay them.  CSW has discussed with patient in past, provided her with resources for understanding medical bills and exploring assistance.  CSW is not aware of any assistance for medical bills for patient.  Deborah Foster reported feeling very sick from new chemotherapy; weak, back pain, loss of appetite.  CSW provided brief support.  Encouraged patient to discuss with her medical oncologist and follow up with dietitian for nutrition counseling.  [name], LCSW  Clinical Social Worker [facility]

## 2018-07-16 ENCOUNTER — Other Ambulatory Visit: Payer: Self-pay | Admitting: *Deleted

## 2018-07-16 ENCOUNTER — Inpatient Hospital Stay: Payer: Medicare Other | Attending: Internal Medicine | Admitting: Internal Medicine

## 2018-07-16 ENCOUNTER — Telehealth: Payer: Self-pay | Admitting: Internal Medicine

## 2018-07-16 ENCOUNTER — Encounter: Payer: Self-pay | Admitting: Internal Medicine

## 2018-07-16 ENCOUNTER — Other Ambulatory Visit: Payer: Self-pay

## 2018-07-16 ENCOUNTER — Inpatient Hospital Stay: Payer: Medicare Other | Admitting: Nutrition

## 2018-07-16 ENCOUNTER — Inpatient Hospital Stay: Payer: Medicare Other

## 2018-07-16 VITALS — BP 95/60 | HR 118 | Temp 98.5°F | Resp 18 | Ht 59.0 in | Wt 93.8 lb

## 2018-07-16 DIAGNOSIS — C7951 Secondary malignant neoplasm of bone: Secondary | ICD-10-CM | POA: Insufficient documentation

## 2018-07-16 DIAGNOSIS — R109 Unspecified abdominal pain: Secondary | ICD-10-CM

## 2018-07-16 DIAGNOSIS — C3491 Malignant neoplasm of unspecified part of right bronchus or lung: Secondary | ICD-10-CM

## 2018-07-16 DIAGNOSIS — R200 Anesthesia of skin: Secondary | ICD-10-CM | POA: Insufficient documentation

## 2018-07-16 DIAGNOSIS — Z923 Personal history of irradiation: Secondary | ICD-10-CM

## 2018-07-16 DIAGNOSIS — E860 Dehydration: Secondary | ICD-10-CM

## 2018-07-16 DIAGNOSIS — C7931 Secondary malignant neoplasm of brain: Secondary | ICD-10-CM | POA: Diagnosis not present

## 2018-07-16 DIAGNOSIS — C7972 Secondary malignant neoplasm of left adrenal gland: Secondary | ICD-10-CM

## 2018-07-16 DIAGNOSIS — R5383 Other fatigue: Secondary | ICD-10-CM | POA: Diagnosis not present

## 2018-07-16 DIAGNOSIS — C342 Malignant neoplasm of middle lobe, bronchus or lung: Secondary | ICD-10-CM | POA: Diagnosis not present

## 2018-07-16 DIAGNOSIS — R140 Abdominal distension (gaseous): Secondary | ICD-10-CM | POA: Diagnosis not present

## 2018-07-16 DIAGNOSIS — G893 Neoplasm related pain (acute) (chronic): Secondary | ICD-10-CM | POA: Diagnosis not present

## 2018-07-16 DIAGNOSIS — Z86718 Personal history of other venous thrombosis and embolism: Secondary | ICD-10-CM | POA: Diagnosis not present

## 2018-07-16 DIAGNOSIS — Z79899 Other long term (current) drug therapy: Secondary | ICD-10-CM | POA: Diagnosis not present

## 2018-07-16 DIAGNOSIS — R531 Weakness: Secondary | ICD-10-CM | POA: Diagnosis not present

## 2018-07-16 DIAGNOSIS — R630 Anorexia: Secondary | ICD-10-CM | POA: Insufficient documentation

## 2018-07-16 DIAGNOSIS — Z7901 Long term (current) use of anticoagulants: Secondary | ICD-10-CM | POA: Diagnosis not present

## 2018-07-16 DIAGNOSIS — R12 Heartburn: Secondary | ICD-10-CM | POA: Insufficient documentation

## 2018-07-16 DIAGNOSIS — R112 Nausea with vomiting, unspecified: Secondary | ICD-10-CM

## 2018-07-16 DIAGNOSIS — H538 Other visual disturbances: Secondary | ICD-10-CM | POA: Diagnosis not present

## 2018-07-16 MED ORDER — SODIUM CHLORIDE 0.9 % IV SOLN
1000.0000 mL | Freq: Once | INTRAVENOUS | Status: AC
  Administered 2018-07-16: 1000 mL via INTRAVENOUS
  Filled 2018-07-16: qty 1000

## 2018-07-16 MED ORDER — SODIUM CHLORIDE 0.9 % IV SOLN
1000.0000 mL | INTRAVENOUS | Status: DC
  Filled 2018-07-16: qty 1000

## 2018-07-16 NOTE — Progress Notes (Signed)
Nutrition follow-up completed with patient and interpreter. Patient is being treated for metastatic lung cancer. Patient weighed 93.8 pounds on July 1 decreased from 104.8 pounds July 15 of 2019. Patient reports intermittent nausea and vomiting, fatigue, weakness, heartburn, and abdominal pain. Reports she can only drink 1 oral nutrition supplement daily and has been choosing [employer] protein because these have been donated. She is willing to try a variety of nutrition supplements.  Nutrition diagnosis: Unintended weight loss continues.  Intervention: Educated patient on strategies for increasing calories and protein intake throughout the day. Encourage patient to take pain medications and nausea medications as prescribed by MD. Encouraged bland foods.  Recommended patient stay upright for 1 hour after eating. Provided a variety of oral nutrition supplements for patient to try such as Dillard [place] and Renee Pain protein powder. Provided 1 complementary case of vanilla Ensure Enlive. Questions were answered and teach back method used.  Monitoring, evaluation, goals: Patient will work to increase calories and protein to minimize weight loss.  Next visit: Patient to contact me.  I have encouraged her to call me when she requires additional supplements.

## 2018-07-16 NOTE — Telephone Encounter (Signed)
Gave avs and calendar ° °

## 2018-07-16 NOTE — Progress Notes (Signed)
[facility] [phone]   [phone]) [phone]  OFFICE PROGRESS NOTE  [name], MD [place] [address]  DIAGNOSIS:  1) Stage IV (T2a, N1, M1b) non-small cell lung cancer, adenocarcinoma with positive EGFR mutation (deletion 23) presented with right middle lobe lung mass, right hilar adenopathy as well as metastatic disease to the bone, brain and left adrenal diagnosed in July 2016. 2) deep venous thrombosis of the left lower extremity diagnosed in March 2020  PRIOR THERAPY: 1) palliative radiotherapy to the brain as well as metastatic bone lesions in the pelvis. 2) Tarceva 150 mg by mouth daily started on 09/25/2014. Status post 5 months of treatment. 3) status post palliative radiotherapy to the right middle lobe lung mass for treatment of hemoptysis. 4) Tarceva 100 mg by mouth daily started 02/19/2015 status post 13 months of treatment. This was discontinued in May 2018 after the patient had disease progression and development of T790M resistant mutation.  5) palliative stereotactic radiotherapy to the left adrenal gland metastatic lesion under the care of Dr. Lisbeth Renshaw completed on April 10, 2017.  CURRENT THERAPY: 1) Tagrisso 80 mg by mouth daily started 06/02/2016.  Status post 24 months of treatment. 2) Xgeva 120 g subcutaneously every 6 weeks. 3) Xalkori 250 mg p.o. twice daily started 05/08/2018.  This was a change it to once daily 2 weeks ago secondary intolerant intolerance.  INTERVAL HISTORY: Deborah Foster 48 y.o. female returns to the clinic today for follow-up visit.  The patient continues to complain of intermittent nausea and vomiting.  She takes Xalkori at nighttime and Tagrisso in the morning.  She continues to have increasing fatigue and weakness as well as abdominal pain and heartburn.  She was treated with omeprazole but she stopped taking it because it was not helping.  She also has aching pain in the bones.   She is currently on Percocet for pain management but she mentioned that it helped her muscles but not the bones.  She lost 4 more pounds since her last visit.  The patient denied having any headache or visual changes.  She has no chest pain, shortness of breath, cough or hemoptysis.  She had MRI of the thoracolumbar spine performed 10 days ago and she is here for evaluation and discussion of her results and treatment options.  MEDICAL HISTORY: Past Medical History:  Diagnosis Date   Bone metastasis ([facility])    Hemoptysis    Hypokalemia    lung ca dx'd 07/2014   Lung mass    Metastasis to adrenal gland ([facility])    Metastasis to brain [facility])    Pneumonia    Radiation 08/23/14-09/07/14   Brain/chest and left hip 30 Gy 12 Fx   URI (upper respiratory infection) 02-02-2015    ALLERGIES:  is allergic to other and doxycycline.  MEDICATIONS:  Current Outpatient Medications  Medication Sig Dispense Refill   AMBULATORY NON FORMULARY MEDICATION Medication Name: MAGIC MOUTHWASH 2 % Viscous Lidocaine  Maalox Benadryl Susp.  Disp: 1:1:1 278m bottle Sig: 118mPO Swish & Spit  q 3-4hrs. PRN mouth sores (Patient not taking: Reported on 07/29/2017) 200 mL 3   benzonatate (TESSALON) 200 MG capsule TK ONE C PO Q 8 H PRN COU  0   Calcium Carbonate-Vitamin D (CALCIUM 500 + D) 500-125 MG-UNIT TABS Take 500 mg by mouth 2 (two) times daily.     chlorpheniramine-HYDROcodone (TUSSIONEX) 10-8 MG/5ML SUER Take 5 mLs by mouth every  12 (twelve) hours as needed. (Patient not taking: Reported on 04/08/2018) 140 mL 0   clindamycin (CLINDAGEL) 1 % gel Apply 1 application topically 2 (two) times daily.     clobetasol cream (TEMOVATE) 4.49 % Apply 1 application topically 2 (two) times daily.     crizotinib (XALKORI) 250 MG capsule Take 1 capsule (250 mg total) by mouth 2 (two) times daily. 60 capsule 2   Cyanocobalamin (VITAMIN B-12) 2500 MCG SUBL Place 2,500 mcg under the tongue daily.     Dextromethorphan  Polistirex (DELSYM PO) Take by mouth.     ENSURE (ENSURE) Take 1 Can by mouth 2 (two) times daily between meals.      ferrous sulfate 325 (65 FE) MG EC tablet Take 325 mg by mouth daily with breakfast.     fluticasone (VERAMYST) 27.5 MCG/SPRAY nasal spray Place 2 sprays into the nose daily.     folic acid (FOLVITE) 201 MCG tablet Take 1 tablet (800 mcg total) by mouth daily. 30 tablet 1   levETIRAcetam (KEPPRA) 500 MG tablet TAKE 2 TABLETS (1,000 MG TOTAL) BY MOUTH 2 TIMES DAILY FOR 14 DAYS. 56 tablet 0   loratadine (CLARITIN) 10 MG tablet Take 1 tablet (10 mg total) by mouth daily. 30 tablet 0   mirtazapine (REMERON) 15 MG tablet Take 1 tablet (15 mg total) by mouth at bedtime. (Patient not taking: Reported on 07/04/2018) 30 tablet 0   Nutritional Supplements (JUICE PLUS FIBRE PO) Take 1 capsule by mouth daily. Reported on 07/28/2015     ondansetron (ZOFRAN) 8 MG tablet TAKE 1 TABLET (8 MG TOTAL) BY MOUTH EVERY 8 HOURS AS NEEDED FOR NAUSEA OR VOMITING. (Patient not taking: Reported on 07/04/2018) 30 tablet 0   OVER THE COUNTER MEDICATION "Deep penetrating pain relief oil"     oxyCODONE-acetaminophen (PERCOCET/ROXICET) 5-325 MG tablet Take 1 tablet by mouth every 6 (six) hours as needed for severe pain. 60 tablet 0   prochlorperazine (COMPAZINE) 10 MG tablet TAKE 1 TABLET BY MOUTH EVERY 6 HOURS AS NEEDED FOR NAUSEA OR VOMITING. 30 tablet 0   ranitidine (ZANTAC) 300 MG tablet Take 1 tablet (300 mg total) by mouth at bedtime. (Patient not taking: Reported on 05/19/2018) 30 tablet 6   TAGRISSO 80 MG tablet TAKE 1 TABLET (80 MG TOTAL) BY MOUTH DAILY. 30 tablet 11   traMADol (ULTRAM) 50 MG tablet TAKE 1 TO 2 TABLETS BY MOUTH EVERY 4 HOURS AS NEEDED FOR PAIN FOR PAIN(MAX 8 TABS IN A DAY)     TURMERIC PO Take 1 capsule by mouth 2 (two) times daily. Turmeric Superior 750 mg po bid     XALKORI 250 MG capsule      XARELTO 20 MG TABS tablet TAKE 1 TABLET (20 MG TOTAL) BY MOUTH DAILY WITH SUPPER.  30 tablet 1   No current facility-administered medications for this visit.     SURGICAL HISTORY:  Past Surgical History:  Procedure Laterality Date   IR GENERIC HISTORICAL  07/14/2015   IR RADIOLOGIST EVAL & MGMT 07/14/2015 Greggory Keen, MD [facility] INTERV RAD   VIDEO BRONCHOSCOPY Bilateral 08/09/2014   Procedure: VIDEO BRONCHOSCOPY WITHOUT FLUORO;  Surgeon: [name], MD;  Location: [facility];  Service: Cardiopulmonary;  Laterality: Bilateral;   VIDEO BRONCHOSCOPY Bilateral 07/06/2015   Procedure: VIDEO BRONCHOSCOPY WITHOUT FLUORO;  Surgeon: [name], MD;  Location: [facility];  Service: Cardiopulmonary;  Laterality: Bilateral;    REVIEW OF SYSTEMS:  Constitutional: positive for fatigue and weight loss Eyes: negative  Ears, nose, mouth, throat, and face: negative Respiratory: negative Cardiovascular: negative Gastrointestinal: positive for nausea and Bloating Genitourinary:negative Integument/breast: negative Hematologic/lymphatic: negative Musculoskeletal:positive for back pain and bone pain Neurological: negative Behavioral/Psych: negative Endocrine: negative Allergic/Immunologic: negative   PHYSICAL EXAMINATION: General appearance: alert, cooperative, fatigued and no distress Head: Normocephalic, without obvious abnormality, atraumatic Neck: no adenopathy, no JVD, supple, symmetrical, trachea midline and thyroid not enlarged, symmetric, no tenderness/mass/nodules Lymph nodes: Cervical, supraclavicular, and axillary nodes normal. Resp: clear to auscultation bilaterally Back: symmetric, no curvature. ROM normal. No CVA tenderness. Cardio: regular rate and rhythm, S1, S2 normal, no murmur, click, rub or gallop GI: soft, non-tender; bowel sounds normal; no masses,  no organomegaly Extremities: extremities normal, atraumatic, no cyanosis or edema Neurologic: Alert and oriented X 3, normal strength and tone. Normal symmetric reflexes. Normal coordination and  gait  ECOG PERFORMANCE STATUS: 1 - Symptomatic but completely ambulatory  Blood pressure 95/60, pulse (!) 118, temperature 98.5 F (36.9 C), temperature source Oral, resp. rate 18, height _0  (1.499 m), weight 93 lb 12.8 oz (42.5 kg), SpO2 98 %.  LABORATORY DATA: Lab Results  Component Value Date   WBC 4.2 06/23/2018   HGB 10.1 (L) 06/23/2018   HCT 31.1 (L) 06/23/2018   MCV 96.6 06/23/2018   PLT 218 06/23/2018      Chemistry      Component Value Date/Time   NA 131 (L) 06/23/2018 1249   NA 140 01/17/2017 1003   K 4.3 06/23/2018 1249   K 3.9 01/17/2017 1003   CL 98 06/23/2018 1249   CO2 26 06/23/2018 1249   CO2 29 01/17/2017 1003   BUN 12 06/23/2018 1249   BUN 14.8 01/17/2017 1003   CREATININE 0.59 06/23/2018 1249   CREATININE 0.8 01/17/2017 1003      Component Value Date/Time   CALCIUM 7.3 (L) 06/23/2018 1249   CALCIUM 8.7 01/17/2017 1003   ALKPHOS 187 (H) 06/23/2018 1249   ALKPHOS 50 01/17/2017 1003   AST 44 (H) 06/23/2018 1249   AST 23 01/17/2017 1003   ALT 83 (H) 06/23/2018 1249   ALT 27 01/17/2017 1003   BILITOT 0.3 06/23/2018 1249   BILITOT 0.46 01/17/2017 1003       RADIOGRAPHIC STUDIES: Ct Chest W Contrast  Result Date: 06/18/2018 CLINICAL DATA:  Lung cancer metastatic to brain, bones and adrenal glands. Ongoing oral chemotherapy. Bloody diarrhea, weakness, fatigue. Slight weight loss, dysphagia. EXAM: CT CHEST, ABDOMEN, AND PELVIS WITH CONTRAST TECHNIQUE: Multidetector CT imaging of the chest, abdomen and pelvis was performed following the standard protocol during bolus administration of intravenous contrast. CONTRAST:  174m OMNIPAQUE IOHEXOL 300 MG/ML  SOLN COMPARISON:  03/18/2018. FINDINGS: CT CHEST FINDINGS Cardiovascular: Enlarged pulmonic trunk. Vascular structures are otherwise unremarkable. Heart size normal. No pericardial effusion. Mediastinum/Nodes: No pathologically enlarged mediastinal, hilar or axillary lymph nodes. Esophagus is grossly  unremarkable. Lungs/Pleura: New airspace consolidation and surrounding ground-glass in the right upper lobe. Additional areas of vague scattered ground-glass in the lungs bilaterally, also new. Post treatment parenchymal retraction and bronchiectasis in the medial and basilar right hemithorax, as before. Small, partially loculated right pleural effusion, increased. New tiny left pleural effusion. Airway is unremarkable. Musculoskeletal: Patchy sclerosis in the right humeral head as well as manubrium and both clavicular heads. Minimal sclerosis in the superior aspect of the sternum. Patchy areas of sclerosis in the ribs on the right. Findings are unchanged from 03/18/2018. CT ABDOMEN PELVIS FINDINGS Hepatobiliary: Liver and gallbladder are unremarkable. Mild biliary ductal prominence, stable.  Pancreas: Negative. Spleen: Negative. Adrenals/Urinary Tract: Adrenal glands and right kidney are unremarkable. There may be slightly decreased parenchymal attenuation in the upper left kidney on delayed imaging, similar to 03/18/2018. Ureters are decompressed. Bladder is low in volume. Stomach/Bowel: Stomach, small bowel, appendix and colon are unremarkable. Vascular/Lymphatic: Vascular structures are unremarkable. Retroperitoneal adenopathy has improved in the interval with residual hazy densities, which are hard to accurately measure. The largest is seen in the left periaortic station, measuring approximately 1.1 x 2.4 cm (series 2, image 54), previously 3.1 x 3.2 cm. No new or additional pathologically enlarged lymph nodes. Reproductive: Uterus is visualized.  No adnexal mass. Other: No free fluid. Mesenteries and peritoneum are otherwise unremarkable. Musculoskeletal: Mixed lytic and sclerotic lesions are seen in proximal femurs, left hemipelvis and spine, as before. IMPRESSION: 1. Post treatment scarring in the right lung with resolution of thoracic adenopathy and improvement in abdominal retroperitoneal adenopathy.  Osseous metastatic disease appears grossly stable. 2. Small loculated right pleural effusion, increased. New tiny left pleural effusion. 3. Enlarged pulmonic trunk, indicative of pulmonary arterial hypertension. Electronically Signed   By: Lorin Picket M.D.   On: 06/18/2018 12:01   Mr Thoracic Spine W Wo Contrast  Result Date: 07/07/2018 CLINICAL DATA:  Back and rib cage pain for the past 2 weeks. History of metastatic lung cancer. EXAM: MRI THORACIC AND LUMBAR SPINE WITHOUT AND WITH CONTRAST TECHNIQUE: Multiplanar and multiecho pulse sequences of the thoracic and lumbar spine were obtained without and with intravenous contrast. CONTRAST:  4 mL Gadavist intravenous contrast. COMPARISON:  CT chest, abdomen, and pelvis dated June 18, 2018. FINDINGS: MRI THORACIC SPINE FINDINGS Alignment:  Physiologic. Vertebrae: 6 mm T1 hypointense lesions without enhancement in the T3 and T8 vertebral bodies, corresponding to the known sclerotic metastases seen on CT. Small hemangiomas in the T5, T7, and T10 vertebral bodies. No acute fracture or evidence of discitis. Cord:  Normal signal and morphology.  No intradural enhancement. Paraspinal and other soft tissues: Increasing moderate partially loculated right pleural effusion. Unchanged trace left pleural effusion. Unchanged post treatment parenchymal retraction and bronchiectasis in the medial right upper lobe and right lower lobe. Unchanged sclerotic metastasis involving the manubrium. Disc levels: No significant disc bulge or herniation. No spinal canal or neuroforaminal stenosis. MRI LUMBAR SPINE FINDINGS Segmentation:  Standard. Alignment:  Physiologic. Vertebrae: T1 hypointense lesions without enhancement involving the L1, L2, and L4 vertebral body corresponding to the known sclerotic metastases seen on CT. No acute fracture or evidence of discitis. Conus medullaris: Extends to the L1 level and appears normal. No intradural enhancement. Paraspinal and other soft  tissues: Unchanged 2.0 cm metastasis in the left posterior iliac bone. Otherwise negative. Disc levels: T12-L1 to L3-L4:  Negative. L4-L5: Small shallow broad-based posterior disc protrusion with central annular fissure. Mild left lateral recess stenosis. No spinal canal or neuroforaminal stenosis. L5-S1: Small central disc protrusion and annular fissure. No stenosis. IMPRESSION: Thoracic spine: 1. Unchanged 6 mm sclerotic metastases in the T3 and T8 vertebral bodies. 2. Increasing moderate partially loculated right pleural effusion. Unchanged trace left pleural effusion. Lumbar spine: 1. Unchanged sclerotic metastases involving the L1, L2, and L4 vertebral bodies. 2. Mild degenerative disc disease at L4-L5 and L5-S1. Electronically Signed   By: Titus Dubin M.D.   On: 07/07/2018 21:46   Mr Lumbar Spine W Wo Contrast  Result Date: 07/07/2018 CLINICAL DATA:  Back and rib cage pain for the past 2 weeks. History of metastatic lung cancer. EXAM: MRI THORACIC AND LUMBAR  SPINE WITHOUT AND WITH CONTRAST TECHNIQUE: Multiplanar and multiecho pulse sequences of the thoracic and lumbar spine were obtained without and with intravenous contrast. CONTRAST:  4 mL Gadavist intravenous contrast. COMPARISON:  CT chest, abdomen, and pelvis dated June 18, 2018. FINDINGS: MRI THORACIC SPINE FINDINGS Alignment:  Physiologic. Vertebrae: 6 mm T1 hypointense lesions without enhancement in the T3 and T8 vertebral bodies, corresponding to the known sclerotic metastases seen on CT. Small hemangiomas in the T5, T7, and T10 vertebral bodies. No acute fracture or evidence of discitis. Cord:  Normal signal and morphology.  No intradural enhancement. Paraspinal and other soft tissues: Increasing moderate partially loculated right pleural effusion. Unchanged trace left pleural effusion. Unchanged post treatment parenchymal retraction and bronchiectasis in the medial right upper lobe and right lower lobe. Unchanged sclerotic metastasis  involving the manubrium. Disc levels: No significant disc bulge or herniation. No spinal canal or neuroforaminal stenosis. MRI LUMBAR SPINE FINDINGS Segmentation:  Standard. Alignment:  Physiologic. Vertebrae: T1 hypointense lesions without enhancement involving the L1, L2, and L4 vertebral body corresponding to the known sclerotic metastases seen on CT. No acute fracture or evidence of discitis. Conus medullaris: Extends to the L1 level and appears normal. No intradural enhancement. Paraspinal and other soft tissues: Unchanged 2.0 cm metastasis in the left posterior iliac bone. Otherwise negative. Disc levels: T12-L1 to L3-L4:  Negative. L4-L5: Small shallow broad-based posterior disc protrusion with central annular fissure. Mild left lateral recess stenosis. No spinal canal or neuroforaminal stenosis. L5-S1: Small central disc protrusion and annular fissure. No stenosis. IMPRESSION: Thoracic spine: 1. Unchanged 6 mm sclerotic metastases in the T3 and T8 vertebral bodies. 2. Increasing moderate partially loculated right pleural effusion. Unchanged trace left pleural effusion. Lumbar spine: 1. Unchanged sclerotic metastases involving the L1, L2, and L4 vertebral bodies. 2. Mild degenerative disc disease at L4-L5 and L5-S1. Electronically Signed   By: Titus Dubin M.D.   On: 07/07/2018 21:46   Ct Abdomen Pelvis W Contrast  Result Date: 06/18/2018 CLINICAL DATA:  Lung cancer metastatic to brain, bones and adrenal glands. Ongoing oral chemotherapy. Bloody diarrhea, weakness, fatigue. Slight weight loss, dysphagia. EXAM: CT CHEST, ABDOMEN, AND PELVIS WITH CONTRAST TECHNIQUE: Multidetector CT imaging of the chest, abdomen and pelvis was performed following the standard protocol during bolus administration of intravenous contrast. CONTRAST:  129m OMNIPAQUE IOHEXOL 300 MG/ML  SOLN COMPARISON:  03/18/2018. FINDINGS: CT CHEST FINDINGS Cardiovascular: Enlarged pulmonic trunk. Vascular structures are otherwise  unremarkable. Heart size normal. No pericardial effusion. Mediastinum/Nodes: No pathologically enlarged mediastinal, hilar or axillary lymph nodes. Esophagus is grossly unremarkable. Lungs/Pleura: New airspace consolidation and surrounding ground-glass in the right upper lobe. Additional areas of vague scattered ground-glass in the lungs bilaterally, also new. Post treatment parenchymal retraction and bronchiectasis in the medial and basilar right hemithorax, as before. Small, partially loculated right pleural effusion, increased. New tiny left pleural effusion. Airway is unremarkable. Musculoskeletal: Patchy sclerosis in the right humeral head as well as manubrium and both clavicular heads. Minimal sclerosis in the superior aspect of the sternum. Patchy areas of sclerosis in the ribs on the right. Findings are unchanged from 03/18/2018. CT ABDOMEN PELVIS FINDINGS Hepatobiliary: Liver and gallbladder are unremarkable. Mild biliary ductal prominence, stable. Pancreas: Negative. Spleen: Negative. Adrenals/Urinary Tract: Adrenal glands and right kidney are unremarkable. There may be slightly decreased parenchymal attenuation in the upper left kidney on delayed imaging, similar to 03/18/2018. Ureters are decompressed. Bladder is low in volume. Stomach/Bowel: Stomach, small bowel, appendix and colon are unremarkable. Vascular/Lymphatic: Vascular  structures are unremarkable. Retroperitoneal adenopathy has improved in the interval with residual hazy densities, which are hard to accurately measure. The largest is seen in the left periaortic station, measuring approximately 1.1 x 2.4 cm (series 2, image 54), previously 3.1 x 3.2 cm. No new or additional pathologically enlarged lymph nodes. Reproductive: Uterus is visualized.  No adnexal mass. Other: No free fluid. Mesenteries and peritoneum are otherwise unremarkable. Musculoskeletal: Mixed lytic and sclerotic lesions are seen in proximal femurs, left hemipelvis and spine,  as before. IMPRESSION: 1. Post treatment scarring in the right lung with resolution of thoracic adenopathy and improvement in abdominal retroperitoneal adenopathy. Osseous metastatic disease appears grossly stable. 2. Small loculated right pleural effusion, increased. New tiny left pleural effusion. 3. Enlarged pulmonic trunk, indicative of pulmonary arterial hypertension. Electronically Signed   By: Lorin Picket M.D.   On: 06/18/2018 12:01    ASSESSMENT AND PLAN:  This is a very pleasant 48 years old Asian female with stage IV non-small cell lung cancer, adenocarcinoma with positive EGFR mutation with deletion in exon 61 diagnosed in July 2016 status post treatment with Tarceva for a total of 18 months but this was discontinued secondary to disease progression and development of T790M resistant mutation. The patient was started on treatment with Tagrisso 80 mg by mouth daily status post 24 months of treatment. She was also found to have MET amplification on repeat molecular studies. In addition to [place]pp[place], the patient was a started on Xalkori 250 mg p.o. twice daily.  She has some side effect of this treatment with increasing fatigue and weakness as well as nausea and vomiting.  Hulda Humphrey was a change it to 250 once daily at nighttime but the patient continues to have similar adverse effects. I recommended for her to hold her treatment with Xalkori for 1 week and to report back to me about her general condition without the treatment. For the dehydration and lack of appetite, I will arrange for the patient to receive 1 L of normal saline in the clinic today. For the heartburn she will use Tums for now as the patient refused to continue on Prilosec. For the bone pain, she will continue on Percocet and I advised the patient to use ibuprofen 2-3 times daily if needed. For the bloating, I advised the patient to use Gas-X on as-needed basis. For the recent diagnosis of deep venous thrombosis of the left  lower extremities, the patient will continue her current treatment with Xalkori 20 mg p.o. daily.  We will hold her treatment if she has any significant bleeding. For the metastatic bone disease, she will continue her current treatment with Xgeva every 6 weeks.  I will see her back for follow-up visit in 1 months for evaluation and repeat blood work. She was advised to call immediately if she has any concerning symptoms in the interval. The patient voices understanding of current disease status and treatment options and is in agreement with the current care plan. All questions were answered. The patient knows to call the clinic with any problems, questions or concerns. We can certainly see the patient much sooner if necessary.  Disclaimer: This note was dictated with voice recognition software. Similar sounding words can inadvertently be transcribed and may not be corrected upon review.

## 2018-07-16 NOTE — Patient Instructions (Signed)

## 2018-07-17 ENCOUNTER — Other Ambulatory Visit: Payer: Self-pay | Admitting: Internal Medicine

## 2018-07-22 MED FILL — XARELTO 20 MG TABLET: 20 | 30 days supply | Qty: 30 | Fill #1

## 2018-07-22 MED FILL — TAGRISSO 80 MG TABLET: 80 | 30 days supply | Qty: 30 | Fill #2

## 2018-07-22 MED FILL — levETIRAcetam 500 MG TABS: 500 | 14 days supply | Qty: 56 | Fill #1

## 2018-07-22 MED FILL — XALKORI 250 MG CAPSULE: 250 | 30 days supply | Qty: 60 | Fill #0

## 2018-07-23 ENCOUNTER — Telehealth: Payer: Self-pay | Admitting: Medical Oncology

## 2018-07-23 NOTE — Telephone Encounter (Addendum)
Itching persists and she is taking 2nd rx prednisone for itching ( Dr Arelia Sneddon) and using clobetasol cream. I told her to call back if itching does not improve.   Improved Back pain , sleeping and eating since off xalkori. I

## 2018-07-24 ENCOUNTER — Ambulatory Visit: Payer: Self-pay

## 2018-07-24 ENCOUNTER — Other Ambulatory Visit: Payer: Self-pay

## 2018-07-29 ENCOUNTER — Telehealth: Payer: Self-pay | Admitting: *Deleted

## 2018-07-29 NOTE — Telephone Encounter (Signed)
TCT patient and spoke with her. Per Dr. Julien Nordmann, pt is to resume her Xalkori for 1 week.  She is to call us with any recurring symptoms. Pt voiced understanding and she states she will start tomorrow. No further questions or concerns.

## 2018-07-29 NOTE — Telephone Encounter (Signed)
She can resume for at least 1 week and let us know if she has any recurring symptoms.

## 2018-07-29 NOTE — Telephone Encounter (Signed)
Received call from patient regarding her Xalkori. She has stopped it for 2 weeks and states she is feeling much better-she is eating well, sleeping better, is able to work. She is asking if she should resume the medication or stop completely.  Please advise

## 2018-08-06 ENCOUNTER — Other Ambulatory Visit: Payer: Self-pay | Admitting: Radiation Therapy

## 2018-08-07 ENCOUNTER — Telehealth: Payer: Self-pay | Admitting: Medical Oncology

## 2018-08-07 ENCOUNTER — Other Ambulatory Visit: Payer: Self-pay

## 2018-08-07 ENCOUNTER — Inpatient Hospital Stay: Payer: Medicare Other

## 2018-08-07 ENCOUNTER — Other Ambulatory Visit: Payer: Self-pay | Admitting: Medical Oncology

## 2018-08-07 DIAGNOSIS — R112 Nausea with vomiting, unspecified: Secondary | ICD-10-CM

## 2018-08-07 DIAGNOSIS — R197 Diarrhea, unspecified: Secondary | ICD-10-CM

## 2018-08-07 DIAGNOSIS — C342 Malignant neoplasm of middle lobe, bronchus or lung: Secondary | ICD-10-CM | POA: Diagnosis not present

## 2018-08-07 MED ORDER — SODIUM CHLORIDE 0.9 % IV SOLN
Freq: Once | INTRAVENOUS | Status: AC
  Administered 2018-08-07: 13:00:00 via INTRAVENOUS
  Filled 2018-08-07: qty 250

## 2018-08-07 NOTE — Patient Instructions (Signed)

## 2018-08-07 NOTE — Progress Notes (Signed)
Ok per MD Mohamed to increase pt's IVF rate from 500 ml/hr to 750-800 ml/hr.  Rate increased to 800 ml/hr.  Pt aware.  Received 1L IVF NS today, tolerated well.  Ate and drank during without any issues.  VSS.  Reports feeling a bit better at end of infusion.

## 2018-08-07 NOTE — Telephone Encounter (Signed)
Pt coming in today for IVF -appt given

## 2018-08-07 NOTE — Telephone Encounter (Signed)
Restarted xalkori one tablet /day a week ago. Diarrhea x 3 yesterday and vomiting x 1 this am . Eyes hurt Requests IVF.  Taking compazine and Imodium ( not enough).  Instructions given to take imodium 2 after first stool then 1 after each successive stool up to 4-6 /day.  Per Julien Nordmann schedule IVF today

## 2018-08-08 ENCOUNTER — Ambulatory Visit
Admission: RE | Admit: 2018-08-08 | Discharge: 2018-08-08 | Disposition: A | Discharge status: Home / Self Care | Payer: Medicare Other | Source: Ambulatory Visit | Attending: Internal Medicine | Admitting: Internal Medicine

## 2018-08-08 DIAGNOSIS — C7931 Secondary malignant neoplasm of brain: Secondary | ICD-10-CM

## 2018-08-08 MED ORDER — GADOBENATE DIMEGLUMINE 529 MG/ML IV SOLN
9.0000 mL | Freq: Once | INTRAVENOUS | Status: AC | PRN
  Administered 2018-08-08: 9 mL via INTRAVENOUS

## 2018-08-11 ENCOUNTER — Other Ambulatory Visit: Payer: Self-pay

## 2018-08-11 ENCOUNTER — Inpatient Hospital Stay: Payer: Medicare Other

## 2018-08-11 ENCOUNTER — Inpatient Hospital Stay: Payer: Medicare Other | Admitting: Internal Medicine

## 2018-08-11 ENCOUNTER — Telehealth: Payer: Self-pay | Admitting: Internal Medicine

## 2018-08-11 ENCOUNTER — Inpatient Hospital Stay (HOSPITAL_BASED_OUTPATIENT_CLINIC_OR_DEPARTMENT_OTHER): Payer: Medicare Other | Admitting: Internal Medicine

## 2018-08-11 ENCOUNTER — Encounter: Payer: Self-pay | Admitting: Internal Medicine

## 2018-08-11 VITALS — BP 101/66 | HR 110 | Temp 98.3°F | Resp 18 | Ht 59.0 in | Wt 95.9 lb

## 2018-08-11 DIAGNOSIS — C7931 Secondary malignant neoplasm of brain: Secondary | ICD-10-CM | POA: Diagnosis not present

## 2018-08-11 DIAGNOSIS — Z79899 Other long term (current) drug therapy: Secondary | ICD-10-CM

## 2018-08-11 DIAGNOSIS — R630 Anorexia: Secondary | ICD-10-CM

## 2018-08-11 DIAGNOSIS — R12 Heartburn: Secondary | ICD-10-CM

## 2018-08-11 DIAGNOSIS — C342 Malignant neoplasm of middle lobe, bronchus or lung: Secondary | ICD-10-CM

## 2018-08-11 DIAGNOSIS — R5383 Other fatigue: Secondary | ICD-10-CM

## 2018-08-11 DIAGNOSIS — G893 Neoplasm related pain (acute) (chronic): Secondary | ICD-10-CM

## 2018-08-11 DIAGNOSIS — C7951 Secondary malignant neoplasm of bone: Secondary | ICD-10-CM

## 2018-08-11 DIAGNOSIS — C3491 Malignant neoplasm of unspecified part of right bronchus or lung: Secondary | ICD-10-CM

## 2018-08-11 DIAGNOSIS — C7972 Secondary malignant neoplasm of left adrenal gland: Secondary | ICD-10-CM | POA: Diagnosis not present

## 2018-08-11 DIAGNOSIS — Z923 Personal history of irradiation: Secondary | ICD-10-CM

## 2018-08-11 DIAGNOSIS — R109 Unspecified abdominal pain: Secondary | ICD-10-CM

## 2018-08-11 DIAGNOSIS — R200 Anesthesia of skin: Secondary | ICD-10-CM

## 2018-08-11 DIAGNOSIS — H538 Other visual disturbances: Secondary | ICD-10-CM

## 2018-08-11 DIAGNOSIS — R531 Weakness: Secondary | ICD-10-CM

## 2018-08-11 DIAGNOSIS — Z7901 Long term (current) use of anticoagulants: Secondary | ICD-10-CM

## 2018-08-11 DIAGNOSIS — R112 Nausea with vomiting, unspecified: Secondary | ICD-10-CM

## 2018-08-11 DIAGNOSIS — C3490 Malignant neoplasm of unspecified part of unspecified bronchus or lung: Secondary | ICD-10-CM

## 2018-08-11 DIAGNOSIS — Z86718 Personal history of other venous thrombosis and embolism: Secondary | ICD-10-CM

## 2018-08-11 LAB — CBC WITH DIFFERENTIAL (CANCER CENTER ONLY)
Abs Immature Granulocytes: 0.02 10*3/uL (ref 0.00–0.07)
Basophils Absolute: 0 10*3/uL (ref 0.0–0.1)
Basophils Relative: 0 %
Eosinophils Absolute: 0.2 10*3/uL (ref 0.0–0.5)
Eosinophils Relative: 4 %
HCT: 37.9 % (ref 36.0–46.0)
Hemoglobin: 12 g/dL (ref 12.0–15.0)
Immature Granulocytes: 1 %
Lymphocytes Relative: 15 %
Lymphs Abs: 0.7 10*3/uL (ref 0.7–4.0)
MCH: 31.7 pg (ref 26.0–34.0)
MCHC: 31.7 g/dL (ref 30.0–36.0)
MCV: 100 fL (ref 80.0–100.0)
Monocytes Absolute: 0.5 10*3/uL (ref 0.1–1.0)
Monocytes Relative: 11 %
Neutro Abs: 3.1 10*3/uL (ref 1.7–7.7)
Neutrophils Relative %: 69 %
Platelet Count: 366 10*3/uL (ref 150–400)
RBC: 3.79 MIL/uL — ABNORMAL LOW (ref 3.87–5.11)
RDW: 14.6 % (ref 11.5–15.5)
WBC Count: 4.4 10*3/uL (ref 4.0–10.5)
nRBC: 0 % (ref 0.0–0.2)

## 2018-08-11 LAB — CMP (CANCER CENTER ONLY)
ALT: 26 U/L (ref 0–44)
AST: 22 U/L (ref 15–41)
Albumin: 3.4 g/dL — ABNORMAL LOW (ref 3.5–5.0)
Alkaline Phosphatase: 98 U/L (ref 38–126)
Anion gap: 7 (ref 5–15)
BUN: 13 mg/dL (ref 6–20)
CO2: 26 mmol/L (ref 22–32)
Calcium: 9 mg/dL (ref 8.9–10.3)
Chloride: 107 mmol/L (ref 98–111)
Creatinine: 0.79 mg/dL (ref 0.44–1.00)
GFR, Est AFR Am: >60 mL/min (ref >60)
GFR, Estimated: >60 mL/min (ref >60)
Glucose, Bld: 87 mg/dL (ref 70–99)
Potassium: 4.6 mmol/L (ref 3.5–5.1)
Sodium: 140 mmol/L (ref 135–145)
Total Bilirubin: 0.2 mg/dL — ABNORMAL LOW (ref 0.3–1.2)
Total Protein: 6.5 g/dL (ref 6.5–8.1)

## 2018-08-11 NOTE — Telephone Encounter (Signed)
Scheduled appt per 7/27 los.  Printed calendar and avs.

## 2018-08-11 NOTE — Telephone Encounter (Signed)
Scheduled appt per 7/27 los - pt aware of change of appts.

## 2018-08-11 NOTE — Progress Notes (Signed)
[place] [phone]   [phone]  OFFICE PROGRESS NOTE  [name], MD [place] [address]  DIAGNOSIS:  1) Stage IV (T2a, N1, M1b) non-small cell lung cancer, adenocarcinoma with positive EGFR mutation (deletion 79) presented with right middle lobe lung mass, right hilar adenopathy as well as metastatic disease to the bone, brain and left adrenal diagnosed in July 2016. 2) deep venous thrombosis of the left lower extremity diagnosed in March 2020  PRIOR THERAPY: 1) palliative radiotherapy to the brain as well as metastatic bone lesions in the pelvis. 2) Tarceva 150 mg by mouth daily started on 09/25/2014. Status post 5 months of treatment. 3) status post palliative radiotherapy to the right middle lobe lung mass for treatment of hemoptysis. 4) Tarceva 100 mg by mouth daily started 02/19/2015 status post 13 months of treatment. This was discontinued in May 2018 after the patient had disease progression and development of T790M resistant mutation.  5) palliative stereotactic radiotherapy to the left adrenal gland metastatic lesion under the care of Dr. Lisbeth Renshaw completed on April 10, 2017.  CURRENT THERAPY: 1) Tagrisso 80 mg by mouth daily started 06/02/2016.  Status post 22 months of treatment. 2) Xgeva 120 g subcutaneously every 6 weeks. 3) Xalkori 250 mg p.o. twice daily started 05/08/2018.  This was a change it to once daily 2 weeks ago secondary intolerant intolerance.  INTERVAL HISTORY: Deborah Foster 48 y.o. female returns to the clinic today for follow-up visit accompanied by her [place] interpreter.  The patient is feeling fine today with no concerning complaints except for one episode of nausea earlier today after she took [place].  She denied having any current chest pain, shortness of breath, cough or hemoptysis.  The pain in the lower back has significantly improved.  She denied having any recent weight  loss or night sweats.  She has no headache or visual changes.  She is here today for evaluation and repeat blood work.  MEDICAL HISTORY: Past Medical History:  Diagnosis Date   Bone metastasis ([facility])    Hemoptysis    Hypokalemia    lung ca dx'd 07/2014   Lung mass    Metastasis to adrenal gland ([facility])    Metastasis to brain [facility])    Pneumonia    Radiation 08/23/14-09/07/14   Brain/chest and left hip 30 Gy 12 Fx   URI (upper respiratory infection) 02-02-2015    ALLERGIES:  is allergic to other and doxycycline.  MEDICATIONS:  Current Outpatient Medications  Medication Sig Dispense Refill   AMBULATORY NON FORMULARY MEDICATION Medication Name: MAGIC MOUTHWASH 2 % Viscous Lidocaine  Maalox Benadryl Susp.  Disp: 1:1:1 273m bottle Sig: 129mPO Swish & Spit  q 3-4hrs. PRN mouth sores 200 mL 3   benzonatate (TESSALON) 200 MG capsule TK ONE C PO Q 8 H PRN COU  0   Calcium Carbonate-Vitamin D (CALCIUM 500 + D) 500-125 MG-UNIT TABS Take 500 mg by mouth 2 (two) times daily.     chlorpheniramine-HYDROcodone (TUSSIONEX) 10-8 MG/5ML SUER Take 5 mLs by mouth every 12 (twelve) hours as needed. (Patient not taking: Reported on 08/11/2018) 140 mL 0   clindamycin (CLINDAGEL) 1 % gel Apply 1 application topically 2 (two) times daily.     clobetasol cream (TEMOVATE) 0.1.44 Apply 1 application topically 2 (two) times daily.     Cyanocobalamin (VITAMIN B-12) 2500 MCG SUBL Place 2,500 mcg under the tongue daily.  Dextromethorphan Polistirex (DELSYM PO) Take by mouth.     ENSURE (ENSURE) Take 1 Can by mouth 2 (two) times daily between meals.      ferrous sulfate 325 (65 FE) MG EC tablet Take 325 mg by mouth daily with breakfast.     fluticasone (VERAMYST) 27.5 MCG/SPRAY nasal spray Place 2 sprays into the nose daily.     folic acid (FOLVITE) 277 MCG tablet Take 1 tablet (800 mcg total) by mouth daily. 30 tablet 1   levETIRAcetam (KEPPRA) 500 MG tablet TAKE 2 TABLETS (1,000 MG TOTAL)  BY MOUTH 2 TIMES DAILY FOR 14 DAYS. 56 tablet 0   levETIRAcetam (KEPPRA) 500 MG tablet      loratadine (CLARITIN) 10 MG tablet Take 1 tablet (10 mg total) by mouth daily. 30 tablet 0   mirtazapine (REMERON) 15 MG tablet Take 1 tablet (15 mg total) by mouth at bedtime. 30 tablet 0   Nutritional Supplements (JUICE PLUS FIBRE PO) Take 1 capsule by mouth daily. Reported on 07/28/2015     ondansetron (ZOFRAN) 8 MG tablet TAKE 1 TABLET (8 MG TOTAL) BY MOUTH EVERY 8 HOURS AS NEEDED FOR NAUSEA OR VOMITING. 30 tablet 0   OVER THE COUNTER MEDICATION "Deep penetrating pain relief oil"     oxyCODONE-acetaminophen (PERCOCET/ROXICET) 5-325 MG tablet Take 1 tablet by mouth every 6 (six) hours as needed for severe pain. 60 tablet 0   oxyCODONE-acetaminophen (PERCOCET/ROXICET) 5-325 MG tablet      prochlorperazine (COMPAZINE) 10 MG tablet TAKE 1 TABLET BY MOUTH EVERY 6 HOURS AS NEEDED FOR NAUSEA OR VOMITING. 30 tablet 0   ranitidine (ZANTAC) 300 MG tablet Take 1 tablet (300 mg total) by mouth at bedtime. 30 tablet 6   TAGRISSO 80 MG tablet TAKE 1 TABLET (80 MG TOTAL) BY MOUTH DAILY. 30 tablet 11   traMADol (ULTRAM) 50 MG tablet TAKE 1 TO 2 TABLETS BY MOUTH EVERY 4 HOURS AS NEEDED FOR PAIN FOR PAIN(MAX 8 TABS IN A DAY)     TURMERIC PO Take 1 capsule by mouth 2 (two) times daily. Turmeric Superior 750 mg po bid     XALKORI 250 MG capsule      XALKORI 250 MG capsule TAKE 1 CAPSULE (250 MG TOTAL) BY MOUTH 2 TIMES DAILY. 60 capsule 2   XARELTO 20 MG TABS tablet TAKE 1 TABLET (20 MG TOTAL) BY MOUTH DAILY WITH SUPPER. 30 tablet 1   No current facility-administered medications for this visit.     SURGICAL HISTORY:  Past Surgical History:  Procedure Laterality Date   IR GENERIC HISTORICAL  07/14/2015   IR RADIOLOGIST EVAL & MGMT 07/14/2015 Greggory Keen, MD [facility] INTERV RAD   VIDEO BRONCHOSCOPY Bilateral 08/09/2014   Procedure: VIDEO BRONCHOSCOPY WITHOUT FLUORO;  Surgeon: [name], MD;   Location: [facility];  Service: Cardiopulmonary;  Laterality: Bilateral;   VIDEO BRONCHOSCOPY Bilateral 07/06/2015   Procedure: VIDEO BRONCHOSCOPY WITHOUT FLUORO;  Surgeon: [name], MD;  Location: [facility];  Service: Cardiopulmonary;  Laterality: Bilateral;    REVIEW OF SYSTEMS:  A comprehensive review of systems was negative except for: Constitutional: positive for fatigue Gastrointestinal: positive for nausea   PHYSICAL EXAMINATION: General appearance: alert, cooperative, fatigued and no distress Head: Normocephalic, without obvious abnormality, atraumatic Neck: no adenopathy, no JVD, supple, symmetrical, trachea midline and thyroid not enlarged, symmetric, no tenderness/mass/nodules Lymph nodes: Cervical, supraclavicular, and axillary nodes normal. Resp: clear to auscultation bilaterally Back: symmetric, no curvature. ROM normal. No CVA tenderness. Cardio: regular rate  and rhythm, S1, S2 normal, no murmur, click, rub or gallop GI: soft, non-tender; bowel sounds normal; no masses,  no organomegaly Extremities: extremities normal, atraumatic, no cyanosis or edema  ECOG PERFORMANCE STATUS: 1 - Symptomatic but completely ambulatory  There were no vitals taken for this visit.  LABORATORY DATA: Lab Results  Component Value Date   WBC 4.4 08/11/2018   HGB 12.0 08/11/2018   HCT 37.9 08/11/2018   MCV 100.0 08/11/2018   PLT 366 08/11/2018      Chemistry      Component Value Date/Time   NA 140 08/11/2018 0902   NA 140 01/17/2017 1003   K 4.6 08/11/2018 0902   K 3.9 01/17/2017 1003   CL 107 08/11/2018 0902   CO2 26 08/11/2018 0902   CO2 29 01/17/2017 1003   BUN 13 08/11/2018 0902   BUN 14.8 01/17/2017 1003   CREATININE 0.79 08/11/2018 0902   CREATININE 0.8 01/17/2017 1003      Component Value Date/Time   CALCIUM 9.0 08/11/2018 0902   CALCIUM 8.7 01/17/2017 1003   ALKPHOS 98 08/11/2018 0902   ALKPHOS 50 01/17/2017 1003   AST 22 08/11/2018 0902   AST 23  01/17/2017 1003   ALT 26 08/11/2018 0902   ALT 27 01/17/2017 1003   BILITOT 0.2 (L) 08/11/2018 0902   BILITOT 0.46 01/17/2017 1003       RADIOGRAPHIC STUDIES: Mr Jeri Cos VO Contrast  Result Date: 08/08/2018 CLINICAL DATA:  Metastatic lung cancer. EXAM: MRI HEAD WITHOUT AND WITH CONTRAST TECHNIQUE: Multiplanar, multiecho pulse sequences of the brain and surrounding structures were obtained without and with intravenous contrast. CONTRAST:  60m MULTIHANCE GADOBENATE DIMEGLUMINE 529 MG/ML IV SOLN COMPARISON:  05/13/2018 FINDINGS: BRAIN New Lesions: None. Larger lesions: None. Stable or Smaller lesions: 1. 7 mm left cerebellar lesion, unchanged in size and with resolved edema (series 11, image 43). 2. 4 mm right occipital lesion, unchanged (series 11, image 67). 3. 18 mm posterior left temporal lesion, unchanged in size and with decreased edema (series 11, image 66). 4. 5 mm medial right parietal lesion, unchanged (series 11, image 110). 5. 11 mm left frontal lesion, unchanged (series 11, image 97). 6. 5 mm medial right frontal lesion, unchanged (series 11, image 102) All of the above stable lesions contain chronic blood products and are largely nonenhancing. Other Brain findings: No acute infarct, midline shift, or extra-axial fluid collection is identified. Numerous chronic cerebral microhemorrhages are again noted. The ventricles are normal in size. Vascular: Major intracranial vascular flow voids are preserved. Skull and upper cervical spine: No suspicious marrow lesion. Sinuses/Orbits: Unremarkable orbits. Paranasal sinuses and mastoid air cells are clear. Other: None. IMPRESSION: 1. Unchanged size of treated brain metastases. Decreased edema associated with the posterior left temporal lesion. 2. No evidence of new brain metastases or acute abnormality. Electronically Signed   By: ALogan BoresM.D.   On: 08/08/2018 14:36    ASSESSMENT AND PLAN:  This is a very pleasant 48years old Asian female with  stage IV non-small cell lung cancer, adenocarcinoma with positive EGFR mutation with deletion in exon 139diagnosed in July 2016 status post treatment with Tarceva for a total of 18 months but this was discontinued secondary to disease progression and development of T790M resistant mutation. The patient was started on treatment with Tagrisso 80 mg by mouth daily status post 24 months of treatment. She was also found to have MET amplification on repeat molecular studies. In addition to TGlide the patient  was a started on Xalkori 250 mg p.o. twice daily.  She has some side effect of this treatment with increasing fatigue and weakness as well as nausea and vomiting.  Hulda Humphrey was a change it to 250 once daily at nighttime  She is feeling much better on the once daily dosing of Xalkori. I recommended for her to continue her current treatment with the combination of Tagrisso and Xalkori for now. For the heartburn she will use Tums for now as the patient refused to continue on Prilosec. For the bone pain, she will continue on Percocet and I advised the patient to use ibuprofen 2-3 times daily if needed. For the bloating, I advised the patient to use Gas-X on as-needed basis. For the recent diagnosis of deep venous thrombosis of the left lower extremities, the patient will continue her current treatment with Xalkori 20 mg p.o. daily.  We will hold her treatment if she has any significant bleeding. For the metastatic bone disease, she will continue her current treatment with Xgeva every 6 weeks.  I will see the patient back for follow-up visit in 1 months for evaluation after repeating CT scan of the chest, abdomen and pelvis for restaging of her disease. She was advised to call immediately if she has any concerning symptoms in the interval. The patient voices understanding of current disease status and treatment options and is in agreement with the current care plan. All questions were answered. The patient  knows to call the clinic with any problems, questions or concerns. We can certainly see the patient much sooner if necessary.  Disclaimer: This note was dictated with voice recognition software. Similar sounding words can inadvertently be transcribed and may not be corrected upon review.

## 2018-08-11 NOTE — Progress Notes (Signed)
[facility] [place], [place] [address] [phone]   Interval Evaluation  Date of Service: 08/11/18 Patient Name: Deborah Foster Patient MRN: [id number] Patient DOB: [birth year] Provider: Ventura Sellers, MD  Identifying Statement:  Deborah Foster is a 47 y.o. female with Metastasis to brain [facility]) [C79.31]   Primary Cancer: Lung adeno w/ EGFR mutation  CNS Oncologic History: 08/23/14-09/07/14:  WBRT 30 Gy in 12 fractions  Interval History:  Deborah Foster presents today following MRI brain.  She describes stability of visual episodes.  She continues to describe lethargy with the medication and poor appetite.  No gait issues or falls.  Still on crizotinib and tagrisso with Dr. Julien Nordmann.  H+P (05/19/18) Patient presents today to review clinical changes and recent MRI brain.  She describes onset of visual changes, described primarily as "episodes of flashing lights".  These episodes occur roughly daily, and tend to last for ~5 minutes before resolving spontaneously.  First episode occurred roughly two weeks ago, very soon after starting crizotinib for progressive EGFR mutant lung adenocarcinoma.  Otherwise vision is not clearly disturbed, maintains full functional independence.  She continues on Tagrisso as well.  She also describes intermittent medial arm numbness, described as "pins and needles" that usually is only symptomatic in the evening.  This has been stable over the past 1 year.    Medications: Current Outpatient Medications on File Prior to Visit  Medication Sig Dispense Refill   AMBULATORY NON FORMULARY MEDICATION Medication Name: MAGIC MOUTHWASH 2 % Viscous Lidocaine  Maalox Benadryl Susp.  Disp: 1:1:1 243m bottle Sig: 159mPO Swish & Spit  q 3-4hrs. PRN mouth sores 200 mL 3   benzonatate (TESSALON) 200 MG capsule TK ONE C PO Q 8 H PRN COU  0   Calcium Carbonate-Vitamin D (CALCIUM 500 + D) 500-125 MG-UNIT TABS Take 500 mg by mouth  2 (two) times daily.     clindamycin (CLINDAGEL) 1 % gel Apply 1 application topically 2 (two) times daily.     clobetasol cream (TEMOVATE) 0.0.17 Apply 1 application topically 2 (two) times daily.     Cyanocobalamin (VITAMIN B-12) 2500 MCG SUBL Place 2,500 mcg under the tongue daily.     Dextromethorphan Polistirex (DELSYM PO) Take by mouth.     ENSURE (ENSURE) Take 1 Can by mouth 2 (two) times daily between meals.      ferrous sulfate 325 (65 FE) MG EC tablet Take 325 mg by mouth daily with breakfast.     fluticasone (VERAMYST) 27.5 MCG/SPRAY nasal spray Place 2 sprays into the nose daily.     folic acid (FOLVITE) 80793CG tablet Take 1 tablet (800 mcg total) by mouth daily. 30 tablet 1   levETIRAcetam (KEPPRA) 500 MG tablet TAKE 2 TABLETS (1,000 MG TOTAL) BY MOUTH 2 TIMES DAILY FOR 14 DAYS. 56 tablet 0   levETIRAcetam (KEPPRA) 500 MG tablet      loratadine (CLARITIN) 10 MG tablet Take 1 tablet (10 mg total) by mouth daily. 30 tablet 0   mirtazapine (REMERON) 15 MG tablet Take 1 tablet (15 mg total) by mouth at bedtime. 30 tablet 0   Nutritional Supplements (JUICE PLUS FIBRE PO) Take 1 capsule by mouth daily. Reported on 07/28/2015     ondansetron (ZOFRAN) 8 MG tablet TAKE 1 TABLET (8 MG TOTAL) BY MOUTH EVERY 8 HOURS AS NEEDED FOR NAUSEA OR VOMITING. 30 tablet 0   OVER THE COUNTER MEDICATION "Deep  penetrating pain relief oil"     oxyCODONE-acetaminophen (PERCOCET/ROXICET) 5-325 MG tablet Take 1 tablet by mouth every 6 (six) hours as needed for severe pain. 60 tablet 0   oxyCODONE-acetaminophen (PERCOCET/ROXICET) 5-325 MG tablet      prochlorperazine (COMPAZINE) 10 MG tablet TAKE 1 TABLET BY MOUTH EVERY 6 HOURS AS NEEDED FOR NAUSEA OR VOMITING. 30 tablet 0   ranitidine (ZANTAC) 300 MG tablet Take 1 tablet (300 mg total) by mouth at bedtime. 30 tablet 6   TAGRISSO 80 MG tablet TAKE 1 TABLET (80 MG TOTAL) BY MOUTH DAILY. 30 tablet 11   TURMERIC PO Take 1 capsule by mouth 2  (two) times daily. Turmeric Superior 750 mg po bid     XALKORI 250 MG capsule      XALKORI 250 MG capsule TAKE 1 CAPSULE (250 MG TOTAL) BY MOUTH 2 TIMES DAILY. 60 capsule 2   XARELTO 20 MG TABS tablet TAKE 1 TABLET (20 MG TOTAL) BY MOUTH DAILY WITH SUPPER. 30 tablet 1   chlorpheniramine-HYDROcodone (TUSSIONEX) 10-8 MG/5ML SUER Take 5 mLs by mouth every 12 (twelve) hours as needed. (Patient not taking: Reported on 08/11/2018) 140 mL 0   traMADol (ULTRAM) 50 MG tablet TAKE 1 TO 2 TABLETS BY MOUTH EVERY 4 HOURS AS NEEDED FOR PAIN FOR PAIN(MAX 8 TABS IN A DAY)     No current facility-administered medications on file prior to visit.     Allergies:  Allergies  Allergen Reactions   Other Other (See Comments)    Patient states that there is some she takes that makes her throat "cry" AND SCRATCHY    Doxycycline Nausea And Vomiting   Past Medical History:  Past Medical History:  Diagnosis Date   Bone metastasis ([place])    Hemoptysis    Hypokalemia    lung ca dx'd 07/2014   Lung mass    Metastasis to adrenal gland ([facility])    Metastasis to brain ([place])    Pneumonia    Radiation 08/23/14-09/07/14   Brain/chest and left hip 30 Gy 12 Fx   URI (upper respiratory infection) 02-02-2015   Past Surgical History:  Past Surgical History:  Procedure Laterality Date   IR GENERIC HISTORICAL  07/14/2015   IR RADIOLOGIST EVAL & MGMT 07/14/2015 Greggory Keen, MD [facility] INTERV RAD   VIDEO BRONCHOSCOPY Bilateral 08/09/2014   Procedure: VIDEO BRONCHOSCOPY WITHOUT FLUORO;  Surgeon: [name], MD;  Location: [facility] ENDOSCOPY;  Service: Cardiopulmonary;  Laterality: Bilateral;   VIDEO BRONCHOSCOPY Bilateral 07/06/2015   Procedure: VIDEO BRONCHOSCOPY WITHOUT FLUORO;  Surgeon: [name], MD;  Location: [facility] ENDOSCOPY;  Service: Cardiopulmonary;  Laterality: Bilateral;   Social History:  Social History   Socioeconomic History   Marital status: Married    Spouse name: Not on file   Number  of children: 3   Years of education: Not on file   Highest education level: Not on file  Occupational History   Occupation: unemployed  Social Designer, fashion/clothing strain: Not on file   Food insecurity    Worry: Not on file    Inability: Not on file   Transportation needs    Medical: Not on file    Non-medical: Not on file  Tobacco Use   Smoking status: Never Smoker   Smokeless tobacco: Never Used  Substance and Sexual Activity   Alcohol use: No    Alcohol/week: 0.0 standard drinks   Drug use: No   Sexual activity: Not Currently  Lifestyle  Physical activity    Days per week: Not on file    Minutes per session: Not on file   Stress: Not on file  Relationships   Social connections    Talks on phone: Not on file    Gets together: Not on file    Attends religious service: Not on file    Active member of club or organization: Not on file    Attends meetings of clubs or organizations: Not on file    Relationship status: Not on file   Intimate partner violence    Fear of current or ex partner: Not on file    Emotionally abused: Not on file    Physically abused: Not on file    Forced sexual activity: Not on file  Other Topics Concern   Not on file  Social History Narrative   Patient moved to [place] in [place].   Had been in [place] since that time.   Family History:  Family History  Problem Relation Age of Onset   Hyperlipidemia Mother     Review of Systems: Constitutional: Denies fevers, chills or abnormal weight loss Eyes: Denies blurriness of vision Ears, nose, mouth, throat, and face: Denies mucositis or sore throat Respiratory: Denies cough, dyspnea or wheezes Cardiovascular: Denies palpitation, chest discomfort or lower extremity swelling Gastrointestinal:  Denies nausea, constipation, diarrhea GU: Denies dysuria or incontinence Skin: Denies abnormal skin rashes Neurological: Per HPI Musculoskeletal: Denies joint pain,  back or neck discomfort. No decrease in ROM Behavioral/Psych: Denies anxiety, disturbance in thought content, and mood instability   Physical Exam: Vitals:   08/11/18 0926  BP: 101/66  Pulse: (!) 110  Resp: 18  Temp: 98.3 F (36.8 C)  SpO2: 100%   KPS: 90. General: Alert, cooperative, pleasant, in no acute distress Head: Normal EENT: No conjunctival injection or scleral icterus. Oral mucosa moist Lungs: Resp effort normal Cardiac: Regular rate and rhythm Abdomen: Soft, non-distended abdomen Skin: No rashes cyanosis or petechiae. Extremities: No clubbing or edema  Neurologic Exam: Mental Status: Awake, alert, attentive to examiner. Oriented to self and environment. Language is fluent with intact comprehension.  Cranial Nerves: Visual acuity is grossly normal. Visual fields are full. Extra-ocular movements intact. No ptosis. Face is symmetric, tongue midline. Motor: Tone and bulk are normal. Power is full in both arms and legs. Reflexes are symmetric, no pathologic reflexes present. Intact finger to nose bilaterally Sensory: Normal Gait: Normal and tandem gait is normal.   Labs: I have reviewed the data as listed    Component Value Date/Time   NA 131 (L) 06/23/2018 1249   NA 140 01/17/2017 1003   K 4.3 06/23/2018 1249   K 3.9 01/17/2017 1003   CL 98 06/23/2018 1249   CO2 26 06/23/2018 1249   CO2 29 01/17/2017 1003   GLUCOSE 96 06/23/2018 1249   GLUCOSE 69 (L) 01/17/2017 1003   BUN 12 06/23/2018 1249   BUN 14.8 01/17/2017 1003   CREATININE 0.59 06/23/2018 1249   CREATININE 0.8 01/17/2017 1003   CALCIUM 7.3 (L) 06/23/2018 1249   CALCIUM 8.7 01/17/2017 1003   PROT 5.4 (L) 06/23/2018 1249   PROT 6.4 01/17/2017 1003   ALBUMIN 2.2 (L) 06/23/2018 1249   ALBUMIN 3.6 01/17/2017 1003   AST 44 (H) 06/23/2018 1249   AST 23 01/17/2017 1003   ALT 83 (H) 06/23/2018 1249   ALT 27 01/17/2017 1003   ALKPHOS 187 (H) 06/23/2018 1249   ALKPHOS 50 01/17/2017 1003   BILITOT  0.3  06/23/2018 1249   BILITOT 0.46 01/17/2017 1003   GFRNONAA >60 06/23/2018 1249   GFRAA >60 06/23/2018 1249   Lab Results  Component Value Date   WBC 4.4 08/11/2018   NEUTROABS 3.1 08/11/2018   HGB 12.0 08/11/2018   HCT 37.9 08/11/2018   MCV 100.0 08/11/2018   PLT 366 08/11/2018    Imaging:  Mr Jeri Cos OV Contrast  Result Date: 08/08/2018 CLINICAL DATA:  Metastatic lung cancer. EXAM: MRI HEAD WITHOUT AND WITH CONTRAST TECHNIQUE: Multiplanar, multiecho pulse sequences of the brain and surrounding structures were obtained without and with intravenous contrast. CONTRAST:  38m MULTIHANCE GADOBENATE DIMEGLUMINE 529 MG/ML IV SOLN COMPARISON:  05/13/2018 FINDINGS: BRAIN New Lesions: None. Larger lesions: None. Stable or Smaller lesions: 1. 7 mm left cerebellar lesion, unchanged in size and with resolved edema (series 11, image 43). 2. 4 mm right occipital lesion, unchanged (series 11, image 67). 3. 18 mm posterior left temporal lesion, unchanged in size and with decreased edema (series 11, image 66). 4. 5 mm medial right parietal lesion, unchanged (series 11, image 110). 5. 11 mm left frontal lesion, unchanged (series 11, image 97). 6. 5 mm medial right frontal lesion, unchanged (series 11, image 102) All of the above stable lesions contain chronic blood products and are largely nonenhancing. Other Brain findings: No acute infarct, midline shift, or extra-axial fluid collection is identified. Numerous chronic cerebral microhemorrhages are again noted. The ventricles are normal in size. Vascular: Major intracranial vascular flow voids are preserved. Skull and upper cervical spine: No suspicious marrow lesion. Sinuses/Orbits: Unremarkable orbits. Paranasal sinuses and mastoid air cells are clear. Other: None. IMPRESSION: 1. Unchanged size of treated brain metastases. Decreased edema associated with the posterior left temporal lesion. 2. No evidence of new brain metastases or acute abnormality. Electronically  Signed   By: ALogan BoresM.D.   On: 08/08/2018 14:36    CRiversideClinician Interpretation: I have personally reviewed the radiological images as listed.  My interpretation, in the context of the patient's clinical presentation, is stable disease   Assessment/Plan 1. Metastasis to brain ([facility]  Ms. LVeseypresents is clinically and radiographically stable today.  We recommended continuing Keppra '250mg'$  BID for visual focal seizures.  She should return to clinic with repeat MRI brain in 6 months or sooner if clinically indicated.    We appreciate the opportunity to participate in the care of Deborah Foster.    All questions were answered. The patient knows to call the clinic with any problems, questions or concerns. No barriers to learning were detected.  The total time spent in the encounter was 25 minutes and more than 50% was on counseling and review of test results   ZVentura Sellers MD Medical Director of Neuro-Oncology [facility] [place]07/27/20 9:33 AM

## 2018-08-18 ENCOUNTER — Other Ambulatory Visit: Payer: Self-pay | Admitting: Physician Assistant

## 2018-08-18 ENCOUNTER — Ambulatory Visit: Payer: Medicare Other | Admitting: Internal Medicine

## 2018-08-18 ENCOUNTER — Other Ambulatory Visit: Payer: Medicare Other

## 2018-08-18 DIAGNOSIS — I82442 Acute embolism and thrombosis of left tibial vein: Secondary | ICD-10-CM

## 2018-08-19 ENCOUNTER — Other Ambulatory Visit: Payer: Self-pay | Admitting: Medical Oncology

## 2018-08-19 NOTE — Telephone Encounter (Signed)
Pt notified that Deborah Foster refilled xarelto.

## 2018-08-25 ENCOUNTER — Other Ambulatory Visit: Payer: Self-pay | Admitting: *Deleted

## 2018-08-25 ENCOUNTER — Other Ambulatory Visit: Payer: Self-pay | Admitting: Physician Assistant

## 2018-08-25 DIAGNOSIS — I82442 Acute embolism and thrombosis of left tibial vein: Secondary | ICD-10-CM

## 2018-08-25 MED ORDER — RIVAROXABAN 20 MG PO TABS: ORAL_TABLET | ORAL | 1 refills | Status: DC

## 2018-08-25 MED FILL — XARELTO 20 MG TABLET: 20 | 30 days supply | Qty: 30 | Fill #0

## 2018-08-25 MED FILL — TAGRISSO 80 MG TABLET: 80 | 30 days supply | Qty: 30 | Fill #3

## 2018-08-25 MED FILL — levETIRAcetam 500 MG TABS: 500 | 14 days supply | Qty: 56 | Fill #2

## 2018-08-25 MED FILL — XALKORI 250 MG CAPSULE: 250 | 30 days supply | Qty: 60 | Fill #1

## 2018-09-09 ENCOUNTER — Other Ambulatory Visit: Payer: Self-pay

## 2018-09-09 ENCOUNTER — Inpatient Hospital Stay: Payer: Medicare Other | Attending: Internal Medicine

## 2018-09-09 ENCOUNTER — Encounter (HOSPITAL_COMMUNITY): Payer: Self-pay

## 2018-09-09 ENCOUNTER — Ambulatory Visit (HOSPITAL_COMMUNITY)
Admission: RE | Admit: 2018-09-09 | Discharge: 2018-09-09 | Disposition: A | Discharge status: Home / Self Care | Payer: Medicare Other | Source: Ambulatory Visit | Attending: Internal Medicine | Admitting: Internal Medicine

## 2018-09-09 DIAGNOSIS — C7951 Secondary malignant neoplasm of bone: Secondary | ICD-10-CM | POA: Diagnosis not present

## 2018-09-09 DIAGNOSIS — Z923 Personal history of irradiation: Secondary | ICD-10-CM | POA: Insufficient documentation

## 2018-09-09 DIAGNOSIS — C3490 Malignant neoplasm of unspecified part of unspecified bronchus or lung: Secondary | ICD-10-CM | POA: Diagnosis present

## 2018-09-09 DIAGNOSIS — Z79899 Other long term (current) drug therapy: Secondary | ICD-10-CM | POA: Insufficient documentation

## 2018-09-09 DIAGNOSIS — R140 Abdominal distension (gaseous): Secondary | ICD-10-CM | POA: Insufficient documentation

## 2018-09-09 DIAGNOSIS — C342 Malignant neoplasm of middle lobe, bronchus or lung: Secondary | ICD-10-CM | POA: Diagnosis not present

## 2018-09-09 DIAGNOSIS — Z86718 Personal history of other venous thrombosis and embolism: Secondary | ICD-10-CM | POA: Diagnosis not present

## 2018-09-09 DIAGNOSIS — C7972 Secondary malignant neoplasm of left adrenal gland: Secondary | ICD-10-CM | POA: Insufficient documentation

## 2018-09-09 DIAGNOSIS — Z9221 Personal history of antineoplastic chemotherapy: Secondary | ICD-10-CM | POA: Diagnosis not present

## 2018-09-09 DIAGNOSIS — Z7901 Long term (current) use of anticoagulants: Secondary | ICD-10-CM | POA: Diagnosis not present

## 2018-09-09 DIAGNOSIS — R5383 Other fatigue: Secondary | ICD-10-CM | POA: Insufficient documentation

## 2018-09-09 DIAGNOSIS — R590 Localized enlarged lymph nodes: Secondary | ICD-10-CM | POA: Insufficient documentation

## 2018-09-09 DIAGNOSIS — R1030 Lower abdominal pain, unspecified: Secondary | ICD-10-CM | POA: Diagnosis not present

## 2018-09-09 LAB — CBC WITH DIFFERENTIAL (CANCER CENTER ONLY)
Abs Immature Granulocytes: 0.01 10*3/uL (ref 0.00–0.07)
Basophils Absolute: 0 10*3/uL (ref 0.0–0.1)
Basophils Relative: 0 %
Eosinophils Absolute: 0.3 10*3/uL (ref 0.0–0.5)
Eosinophils Relative: 5 %
HCT: 38.3 % (ref 36.0–46.0)
Hemoglobin: 12.3 g/dL (ref 12.0–15.0)
Immature Granulocytes: 0 %
Lymphocytes Relative: 17 %
Lymphs Abs: 0.9 10*3/uL (ref 0.7–4.0)
MCH: 30.9 pg (ref 26.0–34.0)
MCHC: 32.1 g/dL (ref 30.0–36.0)
MCV: 96.2 fL (ref 80.0–100.0)
Monocytes Absolute: 0.6 10*3/uL (ref 0.1–1.0)
Monocytes Relative: 12 %
Neutro Abs: 3.3 10*3/uL (ref 1.7–7.7)
Neutrophils Relative %: 66 %
Platelet Count: 330 10*3/uL (ref 150–400)
RBC: 3.98 MIL/uL (ref 3.87–5.11)
RDW: 14.2 % (ref 11.5–15.5)
WBC Count: 5 10*3/uL (ref 4.0–10.5)
nRBC: 0 % (ref 0.0–0.2)

## 2018-09-09 LAB — CMP (CANCER CENTER ONLY)
ALT: 36 U/L (ref 0–44)
AST: 32 U/L (ref 15–41)
Albumin: 3.3 g/dL — ABNORMAL LOW (ref 3.5–5.0)
Alkaline Phosphatase: 86 U/L (ref 38–126)
Anion gap: 10 (ref 5–15)
BUN: 14 mg/dL (ref 6–20)
CO2: 26 mmol/L (ref 22–32)
Calcium: 8.6 mg/dL — ABNORMAL LOW (ref 8.9–10.3)
Chloride: 105 mmol/L (ref 98–111)
Creatinine: 0.75 mg/dL (ref 0.44–1.00)
GFR, Est AFR Am: >60 mL/min (ref >60)
GFR, Estimated: >60 mL/min (ref >60)
Glucose, Bld: 86 mg/dL (ref 70–99)
Potassium: 4.4 mmol/L (ref 3.5–5.1)
Sodium: 141 mmol/L (ref 135–145)
Total Bilirubin: 0.2 mg/dL (ref 0.2–1.6)
Total Protein: 6.5 g/dL (ref 6.5–8.1)

## 2018-09-09 MED ORDER — SODIUM CHLORIDE (PF) 0.9 % IJ SOLN
INTRAMUSCULAR | Status: AC
  Filled 2018-09-09: qty 50

## 2018-09-09 MED ORDER — IOHEXOL 300 MG/ML  SOLN
100.0000 mL | Freq: Once | INTRAMUSCULAR | Status: AC | PRN
  Administered 2018-09-09: 75 mL via INTRAVENOUS

## 2018-09-11 ENCOUNTER — Telehealth: Payer: Self-pay | Admitting: Internal Medicine

## 2018-09-11 ENCOUNTER — Other Ambulatory Visit: Payer: Self-pay

## 2018-09-11 ENCOUNTER — Inpatient Hospital Stay: Payer: Medicare Other | Admitting: Internal Medicine

## 2018-09-11 ENCOUNTER — Encounter: Payer: Self-pay | Admitting: Internal Medicine

## 2018-09-11 VITALS — BP 100/62 | HR 100 | Temp 98.0°F | Resp 18 | Ht 59.0 in | Wt 96.8 lb

## 2018-09-11 DIAGNOSIS — C3491 Malignant neoplasm of unspecified part of right bronchus or lung: Secondary | ICD-10-CM

## 2018-09-11 DIAGNOSIS — C7951 Secondary malignant neoplasm of bone: Secondary | ICD-10-CM

## 2018-09-11 DIAGNOSIS — C7972 Secondary malignant neoplasm of left adrenal gland: Secondary | ICD-10-CM

## 2018-09-11 DIAGNOSIS — C342 Malignant neoplasm of middle lobe, bronchus or lung: Secondary | ICD-10-CM | POA: Diagnosis not present

## 2018-09-11 DIAGNOSIS — Z5111 Encounter for antineoplastic chemotherapy: Secondary | ICD-10-CM

## 2018-09-11 DIAGNOSIS — C7931 Secondary malignant neoplasm of brain: Secondary | ICD-10-CM

## 2018-09-11 NOTE — Progress Notes (Signed)
[place] [phone]   [phone]  OFFICE PROGRESS NOTE  [name], MD [place] [address]  DIAGNOSIS:  1) Stage IV (T2a, N1, M1b) non-small cell lung cancer, adenocarcinoma with positive EGFR mutation (deletion 61) presented with right middle lobe lung mass, right hilar adenopathy as well as metastatic disease to the bone, brain and left adrenal diagnosed in July 2016. 2) deep venous thrombosis of the left lower extremity diagnosed in March 2020  PRIOR THERAPY: 1) palliative radiotherapy to the brain as well as metastatic bone lesions in the pelvis. 2) Tarceva 150 mg by mouth daily started on 09/25/2014. Status post 5 months of treatment. 3) status post palliative radiotherapy to the right middle lobe lung mass for treatment of hemoptysis. 4) Tarceva 100 mg by mouth daily started 02/19/2015 status post 13 months of treatment. This was discontinued in May 2018 after the patient had disease progression and development of T790M resistant mutation.  5) palliative stereotactic radiotherapy to the left adrenal gland metastatic lesion under the care of Dr. Lisbeth Renshaw completed on April 10, 2017.  CURRENT THERAPY: 1) Tagrisso 80 mg by mouth daily started 06/02/2016.  Status post 22 months of treatment. 2) Xgeva 120 g subcutaneously every 6 weeks. 3) Xalkori 250 mg p.o. twice daily started 05/08/2018.  This was a change it to once daily in July 2020 secondary intolerant intolerance.  INTERVAL HISTORY: Deborah Foster 48 y.o. female returns to the clinic today for follow-up visit accompanied by her [place] interpreter.  The patient is feeling fine today with no concerning complaints except for intermittent aching pain in the abdomen as well as the early morning flashing lights in her eyes.  She denied having any current chest pain, shortness of breath, cough or hemoptysis.  She denied having any recent weight loss or night  sweats.  She has no nausea, vomiting, diarrhea or constipation.  She has no headache or visual changes.  She had MRI of the brain last month that was unremarkable for disease progression.  She has been tolerating her treatment with Tagrisso and Xalkori fairly well.  The patient had repeat CT scan of the chest, abdomen pelvis performed recently and she is here for evaluation and discussion of her scan results.   MEDICAL HISTORY: Past Medical History:  Diagnosis Date   Bone metastasis ([facility])    Hemoptysis    Hypokalemia    lung ca dx'd 07/2014   Lung mass    Metastasis to adrenal gland ([facility])    Metastasis to brain [facility])    Pneumonia    Radiation 08/23/14-09/07/14   Brain/chest and left hip 30 Gy 12 Fx   URI (upper respiratory infection) 02-02-2015    ALLERGIES:  is allergic to other and doxycycline.  MEDICATIONS:  Current Outpatient Medications  Medication Sig Dispense Refill   AMBULATORY NON FORMULARY MEDICATION Medication Name: MAGIC MOUTHWASH 2 % Viscous Lidocaine  Maalox Benadryl Susp.  Disp: 1:1:1 233m bottle Sig: 182mPO Swish & Spit  q 3-4hrs. PRN mouth sores 200 mL 3   benzonatate (TESSALON) 200 MG capsule TK ONE C PO Q 8 H PRN COU  0   Calcium Carbonate-Vitamin D (CALCIUM 500 + D) 500-125 MG-UNIT TABS Take 500 mg by mouth 2 (two) times daily.     chlorpheniramine-HYDROcodone (TUSSIONEX) 10-8 MG/5ML SUER Take 5 mLs by mouth every 12 (twelve) hours as needed. (Patient not taking: Reported on 08/11/2018) 140 mL 0  clindamycin (CLINDAGEL) 1 % gel Apply 1 application topically 2 (two) times daily.     clobetasol cream (TEMOVATE) 3.15 % Apply 1 application topically 2 (two) times daily.     Cyanocobalamin (VITAMIN B-12) 2500 MCG SUBL Place 2,500 mcg under the tongue daily.     Dextromethorphan Polistirex (DELSYM PO) Take by mouth.     ENSURE (ENSURE) Take 1 Can by mouth 2 (two) times daily between meals.      ferrous sulfate 325 (65 FE) MG EC tablet Take 325 mg  by mouth daily with breakfast.     fluticasone (VERAMYST) 27.5 MCG/SPRAY nasal spray Place 2 sprays into the nose daily.     folic acid (FOLVITE) 400 MCG tablet Take 1 tablet (800 mcg total) by mouth daily. 30 tablet 1   levETIRAcetam (KEPPRA) 500 MG tablet TAKE 2 TABLETS (1,000 MG TOTAL) BY MOUTH 2 TIMES DAILY FOR 14 DAYS. 56 tablet 0   levETIRAcetam (KEPPRA) 500 MG tablet      loratadine (CLARITIN) 10 MG tablet Take 1 tablet (10 mg total) by mouth daily. 30 tablet 0   mirtazapine (REMERON) 15 MG tablet Take 1 tablet (15 mg total) by mouth at bedtime. 30 tablet 0   Nutritional Supplements (JUICE PLUS FIBRE PO) Take 1 capsule by mouth daily. Reported on 07/28/2015     ondansetron (ZOFRAN) 8 MG tablet TAKE 1 TABLET (8 MG TOTAL) BY MOUTH EVERY 8 HOURS AS NEEDED FOR NAUSEA OR VOMITING. 30 tablet 0   OVER THE COUNTER MEDICATION "Deep penetrating pain relief oil"     oxyCODONE-acetaminophen (PERCOCET/ROXICET) 5-325 MG tablet Take 1 tablet by mouth every 6 (six) hours as needed for severe pain. 60 tablet 0   oxyCODONE-acetaminophen (PERCOCET/ROXICET) 5-325 MG tablet      prochlorperazine (COMPAZINE) 10 MG tablet TAKE 1 TABLET BY MOUTH EVERY 6 HOURS AS NEEDED FOR NAUSEA OR VOMITING. 30 tablet 0   ranitidine (ZANTAC) 300 MG tablet Take 1 tablet (300 mg total) by mouth at bedtime. 30 tablet 6   rivaroxaban (XARELTO) 20 MG TABS tablet TAKE 1 TABLET (20 MG TOTAL) BY MOUTH DAILY WITH SUPPER. 30 tablet 1   TAGRISSO 80 MG tablet TAKE 1 TABLET (80 MG TOTAL) BY MOUTH DAILY. 30 tablet 11   traMADol (ULTRAM) 50 MG tablet TAKE 1 TO 2 TABLETS BY MOUTH EVERY 4 HOURS AS NEEDED FOR PAIN FOR PAIN(MAX 8 TABS IN A DAY)     TURMERIC PO Take 1 capsule by mouth 2 (two) times daily. Turmeric Superior 750 mg po bid     XALKORI 250 MG capsule      XALKORI 250 MG capsule TAKE 1 CAPSULE (250 MG TOTAL) BY MOUTH 2 TIMES DAILY. 60 capsule 2   No current facility-administered medications for this visit.      SURGICAL HISTORY:  Past Surgical History:  Procedure Laterality Date   IR GENERIC HISTORICAL  07/14/2015   IR RADIOLOGIST EVAL & MGMT 07/14/2015 Greggory Keen, MD [facility] INTERV RAD   VIDEO BRONCHOSCOPY Bilateral 08/09/2014   Procedure: VIDEO BRONCHOSCOPY WITHOUT FLUORO;  Surgeon: [name], MD;  Location: [facility];  Service: Cardiopulmonary;  Laterality: Bilateral;   VIDEO BRONCHOSCOPY Bilateral 07/06/2015   Procedure: VIDEO BRONCHOSCOPY WITHOUT FLUORO;  Surgeon: [name], MD;  Location: [facility];  Service: Cardiopulmonary;  Laterality: Bilateral;    REVIEW OF SYSTEMS:  Constitutional: positive for fatigue Eyes: negative Ears, nose, mouth, throat, and face: negative Respiratory: negative Cardiovascular: negative Gastrointestinal: positive for change in bowel habits  Genitourinary:negative Integument/breast: negative Hematologic/lymphatic: negative Musculoskeletal:negative Neurological: negative Behavioral/Psych: negative Endocrine: negative Allergic/Immunologic: negative   PHYSICAL EXAMINATION: General appearance: alert, cooperative, fatigued and no distress Head: Normocephalic, without obvious abnormality, atraumatic Neck: no adenopathy, no JVD, supple, symmetrical, trachea midline and thyroid not enlarged, symmetric, no tenderness/mass/nodules Lymph nodes: Cervical, supraclavicular, and axillary nodes normal. Resp: clear to auscultation bilaterally Back: symmetric, no curvature. ROM normal. No CVA tenderness. Cardio: regular rate and rhythm, S1, S2 normal, no murmur, click, rub or gallop GI: soft, non-tender; bowel sounds normal; no masses,  no organomegaly Extremities: extremities normal, atraumatic, no cyanosis or edema Neurologic: Alert and oriented X 3, normal strength and tone. Normal symmetric reflexes. Normal coordination and gait  ECOG PERFORMANCE STATUS: 1 - Symptomatic but completely ambulatory  Blood pressure 100/62, pulse 100,  temperature 98 F (36.7 C), temperature source Oral, resp. rate 18, height '4\' 11"'$  (1.499 m), weight 96 lb 12.8 oz (43.9 kg), SpO2 100 %.  LABORATORY DATA: Lab Results  Component Value Date   WBC 5.0 09/09/2018   HGB 12.3 09/09/2018   HCT 38.3 09/09/2018   MCV 96.2 09/09/2018   PLT 330 09/09/2018      Chemistry      Component Value Date/Time   NA 141 09/09/2018 0909   NA 140 01/17/2017 1003   K 4.4 09/09/2018 0909   K 3.9 01/17/2017 1003   CL 105 09/09/2018 0909   CO2 26 09/09/2018 0909   CO2 29 01/17/2017 1003   BUN 14 09/09/2018 0909   BUN 14.8 01/17/2017 1003   CREATININE 0.75 09/09/2018 0909   CREATININE 0.8 01/17/2017 1003      Component Value Date/Time   CALCIUM 8.6 (L) 09/09/2018 0909   CALCIUM 8.7 01/17/2017 1003   ALKPHOS 86 09/09/2018 0909   ALKPHOS 50 01/17/2017 1003   AST 32 09/09/2018 0909   AST 23 01/17/2017 1003   ALT 36 09/09/2018 0909   ALT 27 01/17/2017 1003   BILITOT 0.2 09/09/2018 0909   BILITOT 0.46 01/17/2017 1003       RADIOGRAPHIC STUDIES: Ct Chest W Contrast  Result Date: 09/09/2018 CLINICAL DATA:  Stage IV right lung adenocarcinoma diagnosed 2016 with ongoing chemotherapy. History of palliative radiotherapy to the brain, right lung mass and left adrenal metastasis. Patient reports chronic cough, intermittent hemoptysis and lower abdominal cramping and diarrhea. Restaging. EXAM: CT CHEST, ABDOMEN, AND PELVIS WITH CONTRAST TECHNIQUE: Multidetector CT imaging of the chest, abdomen and pelvis was performed following the standard protocol during bolus administration of intravenous contrast. CONTRAST:  64m OMNIPAQUE IOHEXOL 300 MG/ML  SOLN COMPARISON:  06/18/2018 CT chest, abdomen and pelvis. FINDINGS: CT CHEST FINDINGS Cardiovascular: Normal heart size. No significant pericardial effusion/thickening. Normal course and caliber of the thoracic aorta. Stable prominent main pulmonary artery (3.3 cm diameter). No central pulmonary emboli.  Mediastinum/Nodes: No discrete thyroid nodules. Unremarkable esophagus. No pathologically enlarged axillary, mediastinal or hilar lymph nodes. Lungs/Pleura: No pneumothorax. Trace dependent right pleural effusion, slightly decreased. No left pleural effusion. Previously visualized patchy consolidation with air bronchograms in the posteromedial right upper lobe has largely resolved, with mild residual patchy ground-glass opacity in this location. Sharply marginated mid to lower right perihilar consolidation with associated volume loss, distortion and bronchiectasis is unchanged and compatible with radiation fibrosis. No acute consolidative airspace disease. No significant pulmonary nodules. Musculoskeletal: Patchy sclerotic osseous lesions throughout bilateral clavicles, sternum, bilateral ribs, bilateral shoulder girdles and T3 and T8 vertebral bodies, not appreciably changed. No new focal osseous lesions in the chest. CT  ABDOMEN PELVIS FINDINGS Hepatobiliary: Normal liver size. Subcentimeter hypodense lateral segment left liver lobe lesion is too small to characterize and not definitely changed. No new liver lesions. Normal gallbladder with no radiopaque cholelithiasis. No biliary ductal dilatation. Pancreas: Normal, with no mass or duct dilation. Spleen: Normal size. No mass. Adrenals/Urinary Tract: No discrete adrenal nodules. Normal kidneys with no hydronephrosis and no renal mass. Normal bladder. Stomach/Bowel: Normal non-distended stomach. Normal caliber small bowel with no small bowel wall thickening. Normal appendix. Oral contrast transits to the rectum. Normal large bowel with no diverticulosis, large bowel wall thickening or pericolonic fat stranding. Vascular/Lymphatic: Normal caliber abdominal aorta. Patent portal, splenic, hepatic and renal veins. Continued reduction in left para-aortic adenopathy, currently 0.9 cm (series 2/image 57), decreased from 01/01 cm on 06/18/2018 CT using similar measurement  technique. Currently no pathologically enlarged lymph nodes in the abdomen or pelvis. Reproductive: Grossly normal uterus.  No adnexal mass. Other: No pneumoperitoneum, ascites or focal fluid collection. Musculoskeletal: No appreciable change in scattered sclerotic osseous lesions throughout lumbar spine (most prominent at L1 and L2) and bilateral pelvic girdle most prominent in supra-acetabular left iliac bone. No appreciable new focal osseous lesions. IMPRESSION: 1. Previously visualized posteromedial right upper lobe consolidation is largely resolved, with mild residual patchy ground-glass opacity, compatible with resolving infection or alveolar hemorrhage. 2. 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 No findings to suggest local tumor recurrence. Trace dependent right pleural effusion is slightly decreased. 3. No recurrent thoracic adenopathy. 4. Continued reduction in retroperitoneal adenopathy, with no residual pathologically enlarged abdominopelvic nodes. 5. Widespread sclerotic osseous metastases are stable. 6. No new or progressive metastatic disease. Electronically Signed   By: Ilona Sorrel M.D.   On: 09/09/2018 17:21   Ct Abdomen Pelvis W Contrast  Result Date: 09/09/2018 CLINICAL DATA:  Stage IV right lung adenocarcinoma diagnosed 2016 with ongoing chemotherapy. History of palliative radiotherapy to the brain, right lung mass and left adrenal metastasis. Patient reports chronic cough, intermittent hemoptysis and lower abdominal cramping and diarrhea. Restaging. EXAM: CT CHEST, ABDOMEN, AND PELVIS WITH CONTRAST TECHNIQUE: Multidetector CT imaging of the chest, abdomen and pelvis was performed following the standard protocol during bolus administration of intravenous contrast. CONTRAST:  65m OMNIPAQUE IOHEXOL 300 MG/ML  SOLN COMPARISON:  06/18/2018 CT chest, abdomen and pelvis. FINDINGS: CT CHEST FINDINGS Cardiovascular: Normal heart size. No significant pericardial effusion/thickening.  Normal course and caliber of the thoracic aorta. Stable prominent main pulmonary artery (3.3 cm diameter). No central pulmonary emboli. Mediastinum/Nodes: No discrete thyroid nodules. Unremarkable esophagus. No pathologically enlarged axillary, mediastinal or hilar lymph nodes. Lungs/Pleura: No pneumothorax. Trace dependent right pleural effusion, slightly decreased. No left pleural effusion. Previously visualized patchy consolidation with air bronchograms in the posteromedial right upper lobe has largely resolved, with mild residual patchy ground-glass opacity in this location. Sharply marginated mid to lower right perihilar consolidation with associated volume loss, distortion and bronchiectasis is unchanged and compatible with radiation fibrosis. No acute consolidative airspace disease. No significant pulmonary nodules. Musculoskeletal: Patchy sclerotic osseous lesions throughout bilateral clavicles, sternum, bilateral ribs, bilateral shoulder girdles and T3 and T8 vertebral bodies, not appreciably changed. No new focal osseous lesions in the chest. CT ABDOMEN PELVIS FINDINGS Hepatobiliary: Normal liver size. Subcentimeter hypodense lateral segment left liver lobe lesion is too small to characterize and not definitely changed. No new liver lesions. Normal gallbladder with no radiopaque cholelithiasis. No biliary ductal dilatation. Pancreas: Normal, with no mass or duct dilation. Spleen: Normal size. No mass. Adrenals/Urinary Tract: No  discrete adrenal nodules. Normal kidneys with no hydronephrosis and no renal mass. Normal bladder. Stomach/Bowel: Normal non-distended stomach. Normal caliber small bowel with no small bowel wall thickening. Normal appendix. Oral contrast transits to the rectum. Normal large bowel with no diverticulosis, large bowel wall thickening or pericolonic fat stranding. Vascular/Lymphatic: Normal caliber abdominal aorta. Patent portal, splenic, hepatic and renal veins. Continued reduction in  left para-aortic adenopathy, currently 0.9 cm (series 2/image 57), decreased from 01/01 cm on 06/18/2018 CT using similar measurement technique. Currently no pathologically enlarged lymph nodes in the abdomen or pelvis. Reproductive: Grossly normal uterus.  No adnexal mass. Other: No pneumoperitoneum, ascites or focal fluid collection. Musculoskeletal: No appreciable change in scattered sclerotic osseous lesions throughout lumbar spine (most prominent at L1 and L2) and bilateral pelvic girdle most prominent in supra-acetabular left iliac bone. No appreciable new focal osseous lesions. IMPRESSION: 1. Previously visualized posteromedial right upper lobe consolidation is largely resolved, with mild residual patchy ground-glass opacity, compatible with resolving infection or alveolar hemorrhage. 2. 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 No findings to suggest local tumor recurrence. Trace dependent right pleural effusion is slightly decreased. 3. No recurrent thoracic adenopathy. 4. Continued reduction in retroperitoneal adenopathy, with no residual pathologically enlarged abdominopelvic nodes. 5. Widespread sclerotic osseous metastases are stable. 6. No new or progressive metastatic disease. Electronically Signed   By: Ilona Sorrel M.D.   On: 09/09/2018 17:21    ASSESSMENT AND PLAN:  This is a very pleasant 49 years old Asian female with stage IV non-small cell lung cancer, adenocarcinoma with positive EGFR mutation with deletion in exon 70 diagnosed in July 2016 status post treatment with Tarceva for a total of 18 months but this was discontinued secondary to disease progression and development of T790M resistant mutation. The patient was started on treatment with Tagrisso 80 mg by mouth daily status post 24 months of treatment. She was also found to have MET amplification on repeat molecular studies. In addition to [place], the patient was a started on Xalkori 250 mg p.o. twice daily.  She has  some side effect of this treatment with increasing fatigue and weakness as well as nausea and vomiting.  Hulda Humphrey was a change it to 250 once daily at nighttime  The patient has been tolerating this treatment much better. She had repeat CT scan of the chest, abdomen pelvis performed recently.  I personally and independently reviewed the scans and discussed the result with the patient today. Her scan showed no concerning findings for disease progression and there was some improvement in the retroperitoneal adenopathy. I recommended for her to continue her current treatment with Tagrisso and Xalkori with the same dose. For the bloating, I advised the patient to use Gas-X on as-needed basis. For the deep venous thrombosis of the left lower extremities, the patient will continue her current treatment with Xalkori 20 mg p.o. daily.  We will hold her treatment if she has any significant bleeding. For the metastatic bone disease, she will continue her current treatment with Xgeva every 6 weeks.  The patient will come back for follow-up visit in 6 weeks for evaluation with repeat blood work. She was advised to call immediately if she has any concerning symptoms in the interval. The patient voices understanding of current disease status and treatment options and is in agreement with the current care plan. All questions were answered. The patient knows to call the clinic with any problems, questions or concerns. We can certainly see the patient much  sooner if necessary.  Disclaimer: This note was dictated with voice recognition software. Similar sounding words can inadvertently be transcribed and may not be corrected upon review.

## 2018-09-11 NOTE — Telephone Encounter (Signed)
Scheduled appointment per 08/27 los, patient received avs and calender.

## 2018-09-18 MED FILL — levETIRAcetam 500 MG TABS: 500 | 14 days supply | Qty: 56 | Fill #3

## 2018-09-18 MED FILL — XALKORI 250 MG CAPSULE: 250 | 30 days supply | Qty: 60 | Fill #2

## 2018-09-18 MED FILL — XARELTO 20 MG TABLET: 20 | 30 days supply | Qty: 30 | Fill #1

## 2018-09-18 MED FILL — TAGRISSO 80 MG TABLET: 80 | 30 days supply | Qty: 30 | Fill #4

## 2018-10-14 ENCOUNTER — Other Ambulatory Visit: Payer: Self-pay | Admitting: Physician Assistant

## 2018-10-14 ENCOUNTER — Other Ambulatory Visit: Payer: Self-pay | Admitting: Internal Medicine

## 2018-10-14 DIAGNOSIS — I82442 Acute embolism and thrombosis of left tibial vein: Secondary | ICD-10-CM

## 2018-10-17 MED FILL — levETIRAcetam 500 MG TABS: 500 | 14 days supply | Qty: 56 | Fill #4

## 2018-10-17 MED FILL — XARELTO 20 MG TABLET: 20 | 30 days supply | Qty: 30 | Fill #0

## 2018-10-17 MED FILL — TAGRISSO 80 MG TABLET: 80 | 30 days supply | Qty: 30 | Fill #5

## 2018-10-17 MED FILL — XALKORI 250 MG CAPSULE: 250 | 30 days supply | Qty: 60 | Fill #0

## 2018-10-22 ENCOUNTER — Inpatient Hospital Stay: Payer: Medicare Other | Admitting: Internal Medicine

## 2018-10-22 ENCOUNTER — Other Ambulatory Visit: Payer: Self-pay

## 2018-10-22 ENCOUNTER — Inpatient Hospital Stay: Payer: Medicare Other | Attending: Internal Medicine

## 2018-10-22 ENCOUNTER — Encounter: Payer: Self-pay | Admitting: Internal Medicine

## 2018-10-22 ENCOUNTER — Telehealth: Payer: Self-pay | Admitting: Internal Medicine

## 2018-10-22 VITALS — BP 105/46 | HR 99 | Temp 98.9°F | Resp 18 | Ht 59.0 in | Wt 97.3 lb

## 2018-10-22 DIAGNOSIS — C3490 Malignant neoplasm of unspecified part of unspecified bronchus or lung: Secondary | ICD-10-CM

## 2018-10-22 DIAGNOSIS — C3491 Malignant neoplasm of unspecified part of right bronchus or lung: Secondary | ICD-10-CM

## 2018-10-22 DIAGNOSIS — C7972 Secondary malignant neoplasm of left adrenal gland: Secondary | ICD-10-CM | POA: Insufficient documentation

## 2018-10-22 DIAGNOSIS — C7931 Secondary malignant neoplasm of brain: Secondary | ICD-10-CM | POA: Insufficient documentation

## 2018-10-22 DIAGNOSIS — C342 Malignant neoplasm of middle lobe, bronchus or lung: Secondary | ICD-10-CM | POA: Insufficient documentation

## 2018-10-22 DIAGNOSIS — Z5111 Encounter for antineoplastic chemotherapy: Secondary | ICD-10-CM

## 2018-10-22 DIAGNOSIS — Z79899 Other long term (current) drug therapy: Secondary | ICD-10-CM | POA: Diagnosis not present

## 2018-10-22 DIAGNOSIS — R5383 Other fatigue: Secondary | ICD-10-CM | POA: Diagnosis not present

## 2018-10-22 DIAGNOSIS — Z9221 Personal history of antineoplastic chemotherapy: Secondary | ICD-10-CM | POA: Insufficient documentation

## 2018-10-22 DIAGNOSIS — C7951 Secondary malignant neoplasm of bone: Secondary | ICD-10-CM

## 2018-10-22 DIAGNOSIS — Z7901 Long term (current) use of anticoagulants: Secondary | ICD-10-CM | POA: Diagnosis not present

## 2018-10-22 DIAGNOSIS — Z86718 Personal history of other venous thrombosis and embolism: Secondary | ICD-10-CM | POA: Diagnosis not present

## 2018-10-22 DIAGNOSIS — Z923 Personal history of irradiation: Secondary | ICD-10-CM | POA: Diagnosis not present

## 2018-10-22 LAB — CBC WITH DIFFERENTIAL (CANCER CENTER ONLY)
Abs Immature Granulocytes: 0.01 10*3/uL (ref 0.00–0.07)
Basophils Absolute: 0 10*3/uL (ref 0.0–0.1)
Basophils Relative: 1 %
Eosinophils Absolute: 0.2 10*3/uL (ref 0.0–0.5)
Eosinophils Relative: 6 %
HCT: 35.8 % — ABNORMAL LOW (ref 36.0–46.0)
Hemoglobin: 11.6 g/dL — ABNORMAL LOW (ref 12.0–15.0)
Immature Granulocytes: 0 %
Lymphocytes Relative: 23 %
Lymphs Abs: 0.7 10*3/uL (ref 0.7–4.0)
MCH: 30.8 pg (ref 26.0–34.0)
MCHC: 32.4 g/dL (ref 30.0–36.0)
MCV: 95 fL (ref 80.0–100.0)
Monocytes Absolute: 0.5 10*3/uL (ref 0.1–1.0)
Monocytes Relative: 17 %
Neutro Abs: 1.5 10*3/uL — ABNORMAL LOW (ref 1.7–7.7)
Neutrophils Relative %: 53 %
Platelet Count: 277 10*3/uL (ref 150–400)
RBC: 3.77 MIL/uL — ABNORMAL LOW (ref 3.87–5.11)
RDW: 14.6 % (ref 11.5–15.5)
WBC Count: 2.9 10*3/uL — ABNORMAL LOW (ref 4.0–10.5)
nRBC: 0 % (ref 0.0–0.2)

## 2018-10-22 LAB — CMP (CANCER CENTER ONLY)
ALT: 27 U/L (ref 0–44)
AST: 31 U/L (ref 15–41)
Albumin: 3 g/dL — ABNORMAL LOW (ref 3.5–5.0)
Alkaline Phosphatase: 78 U/L (ref 38–126)
Anion gap: 6 (ref 5–15)
BUN: 13 mg/dL (ref 6–20)
CO2: 25 mmol/L (ref 22–32)
Calcium: 8.1 mg/dL — ABNORMAL LOW (ref 8.9–10.3)
Chloride: 107 mmol/L (ref 98–111)
Creatinine: 0.7 mg/dL (ref 0.44–1.00)
GFR, Est AFR Am: >60 mL/min (ref >60)
GFR, Estimated: >60 mL/min (ref >60)
Glucose, Bld: 90 mg/dL (ref 70–99)
Potassium: 3.9 mmol/L (ref 3.5–5.1)
Sodium: 138 mmol/L (ref 135–145)
Total Bilirubin: 0.2 mg/dL — ABNORMAL LOW (ref 0.3–1.2)
Total Protein: 5.9 g/dL — ABNORMAL LOW (ref 6.5–8.1)

## 2018-10-22 NOTE — Progress Notes (Signed)
[name] [phone]   [phone]  OFFICE PROGRESS NOTE  [name], MD [place] [address]  DIAGNOSIS:  1) Stage IV (T2a, N1, M1b) non-small cell lung cancer, adenocarcinoma with positive EGFR mutation (deletion 6) presented with right middle lobe lung mass, right hilar adenopathy as well as metastatic disease to the bone, brain and left adrenal diagnosed in July 2016. 2) deep venous thrombosis of the left lower extremity diagnosed in March 2020  PRIOR THERAPY: 1) palliative radiotherapy to the brain as well as metastatic bone lesions in the pelvis. 2) Tarceva 150 mg by mouth daily started on 09/25/2014. Status post 5 months of treatment. 3) status post palliative radiotherapy to the right middle lobe lung mass for treatment of hemoptysis. 4) Tarceva 100 mg by mouth daily started 02/19/2015 status post 13 months of treatment. This was discontinued in May 2018 after the patient had disease progression and development of T790M resistant mutation.  5) palliative stereotactic radiotherapy to the left adrenal gland metastatic lesion under the care of Dr. Lisbeth Renshaw completed on April 10, 2017.  CURRENT THERAPY: 1) Tagrisso 80 mg by mouth daily started 06/02/2016.  Status post 29 months of treatment. 2) Xgeva 120 g subcutaneously every 6 weeks. 3) Xalkori 250 mg p.o. twice daily started 05/08/2018.  This was a change it to once daily in July 2020 secondary intolerant intolerance.  INTERVAL HISTORY: Deborah Foster 48 y.o. female returns to the clinic today for follow-up visit accompanied by her [place] interpreter.  The patient is feeling fine today with no concerning complaints except for mild fatigue.  She denied having any recent weight loss or night sweats.  She has no nausea, vomiting, diarrhea or constipation.  She has no headache or visual changes.  She denied having any chest pain, shortness of breath, cough or  hemoptysis.  She continues to tolerate her treatment with Tagrisso and Hulda Humphrey fairly well.  She is here today for evaluation and repeat blood work.   MEDICAL HISTORY: Past Medical History:  Diagnosis Date  . Bone metastasis ([place])   . Hemoptysis   . Hypokalemia   . lung ca dx'd 07/2014  . Lung mass   . Metastasis to adrenal gland ([place])   . Metastasis to brain ([place])   . Pneumonia   . Radiation 08/23/14-09/07/14   Brain/chest and left hip 30 Gy 12 Fx  . URI (upper respiratory infection) 02-02-2015    ALLERGIES:  is allergic to other and doxycycline.  MEDICATIONS:  Current Outpatient Medications  Medication Sig Dispense Refill  . AMBULATORY NON FORMULARY MEDICATION Medication Name: MAGIC MOUTHWASH 2 % Viscous Lidocaine  Maalox Benadryl Susp.  Disp: 1:1:1 255m bottle Sig: 152mPO Swish & Spit  q 3-4hrs. PRN mouth sores 200 mL 3  . benzonatate (TESSALON) 200 MG capsule TK ONE C PO Q 8 H PRN COU  0  . Calcium Carbonate-Vitamin D (CALCIUM 500 + D) 500-125 MG-UNIT TABS Take 500 mg by mouth 2 (two) times daily.    . chlorpheniramine-HYDROcodone (TUSSIONEX) 10-8 MG/5ML SUER Take 5 mLs by mouth every 12 (twelve) hours as needed. 140 mL 0  . clindamycin (CLINDAGEL) 1 % gel Apply 1 application topically 2 (two) times daily.    . clobetasol cream (TEMOVATE) 0.3.81 Apply 1 application topically 2 (two) times daily.    . Cyanocobalamin (VITAMIN B-12) 2500 MCG SUBL Place 2,500 mcg under the tongue daily.    . Marland Kitchenextromethorphan  Polistirex (DELSYM PO) Take by mouth.    . ENSURE (ENSURE) Take 1 Can by mouth 2 (two) times daily between meals.     . ferrous sulfate 325 (65 FE) MG EC tablet Take 325 mg by mouth daily with breakfast.    . fluticasone (VERAMYST) 27.5 MCG/SPRAY nasal spray Place 2 sprays into the nose daily.    . folic acid (FOLVITE) 284 MCG tablet Take 1 tablet (800 mcg total) by mouth daily. 30 tablet 1  . levETIRAcetam (KEPPRA) 500 MG tablet TAKE 2 TABLETS (1,000 MG TOTAL) BY MOUTH 2  TIMES DAILY FOR 14 DAYS. 56 tablet 0  . levETIRAcetam (KEPPRA) 500 MG tablet     . loratadine (CLARITIN) 10 MG tablet Take 1 tablet (10 mg total) by mouth daily. 30 tablet 0  . mirtazapine (REMERON) 15 MG tablet Take 1 tablet (15 mg total) by mouth at bedtime. 30 tablet 0  . Nutritional Supplements (JUICE PLUS FIBRE PO) Take 1 capsule by mouth daily. Reported on 07/28/2015    . ondansetron (ZOFRAN) 8 MG tablet TAKE 1 TABLET (8 MG TOTAL) BY MOUTH EVERY 8 HOURS AS NEEDED FOR NAUSEA OR VOMITING. 30 tablet 0  . OVER THE COUNTER MEDICATION "Deep penetrating pain relief oil"    . oxyCODONE-acetaminophen (PERCOCET/ROXICET) 5-325 MG tablet Take 1 tablet by mouth every 6 (six) hours as needed for severe pain. 60 tablet 0  . oxyCODONE-acetaminophen (PERCOCET/ROXICET) 5-325 MG tablet     . prochlorperazine (COMPAZINE) 10 MG tablet TAKE 1 TABLET BY MOUTH EVERY 6 HOURS AS NEEDED FOR NAUSEA OR VOMITING. 30 tablet 0  . ranitidine (ZANTAC) 300 MG tablet Take 1 tablet (300 mg total) by mouth at bedtime. 30 tablet 6  . TAGRISSO 80 MG tablet TAKE 1 TABLET (80 MG TOTAL) BY MOUTH DAILY. 30 tablet 11  . traMADol (ULTRAM) 50 MG tablet TAKE 1 TO 2 TABLETS BY MOUTH EVERY 4 HOURS AS NEEDED FOR PAIN FOR PAIN(MAX 8 TABS IN A DAY)    . TURMERIC PO Take 1 capsule by mouth 2 (two) times daily. Turmeric Superior 750 mg po bid    . XALKORI 250 MG capsule     . XALKORI 250 MG capsule TAKE 1 CAPSULE (250 MG TOTAL) BY MOUTH 2 TIMES DAILY. 60 capsule 2  . XARELTO 20 MG TABS tablet TAKE 1 TABLET (20 MG TOTAL) BY MOUTH DAILY WITH SUPPER. 30 tablet 1   No current facility-administered medications for this visit.     SURGICAL HISTORY:  Past Surgical History:  Procedure Laterality Date  . IR GENERIC HISTORICAL  07/14/2015   IR RADIOLOGIST EVAL & MGMT 07/14/2015 Greggory Keen, MD [facility] INTERV RAD  . VIDEO BRONCHOSCOPY Bilateral 08/09/2014   Procedure: VIDEO BRONCHOSCOPY WITHOUT FLUORO;  Surgeon: [name], MD;  Location: [facility]  ENDOSCOPY;  Service: Cardiopulmonary;  Laterality: Bilateral;  . VIDEO BRONCHOSCOPY Bilateral 07/06/2015   Procedure: VIDEO BRONCHOSCOPY WITHOUT FLUORO;  Surgeon: [name], MD;  Location: [facility] ENDOSCOPY;  Service: Cardiopulmonary;  Laterality: Bilateral;    REVIEW OF SYSTEMS:  A comprehensive review of systems was negative except for: Constitutional: positive for fatigue   PHYSICAL EXAMINATION: General appearance: alert, cooperative, fatigued and no distress Head: Normocephalic, without obvious abnormality, atraumatic Neck: no adenopathy, no JVD, supple, symmetrical, trachea midline and thyroid not enlarged, symmetric, no tenderness/mass/nodules Lymph nodes: Cervical, supraclavicular, and axillary nodes normal. Resp: clear to auscultation bilaterally Back: symmetric, no curvature. ROM normal. No CVA tenderness. Cardio: regular rate and rhythm, S1, S2 normal,  no murmur, click, rub or gallop GI: soft, non-tender; bowel sounds normal; no masses,  no organomegaly Extremities: extremities normal, atraumatic, no cyanosis or edema  ECOG PERFORMANCE STATUS: 1 - Symptomatic but completely ambulatory  Blood pressure (!) 105/46, pulse 99, temperature 98.9 F (37.2 C), temperature source Temporal, resp. rate 18, height '4\' 11"'$  (1.499 m), weight 97 lb 4.8 oz (44.1 kg), SpO2 100 %.  LABORATORY DATA: Lab Results  Component Value Date   WBC 2.9 (L) 10/22/2018   HGB 11.6 (L) 10/22/2018   HCT 35.8 (L) 10/22/2018   MCV 95.0 10/22/2018   PLT 277 10/22/2018      Chemistry      Component Value Date/Time   NA 138 10/22/2018 0819   NA 140 01/17/2017 1003   K 3.9 10/22/2018 0819   K 3.9 01/17/2017 1003   CL 107 10/22/2018 0819   CO2 25 10/22/2018 0819   CO2 29 01/17/2017 1003   BUN 13 10/22/2018 0819   BUN 14.8 01/17/2017 1003   CREATININE 0.70 10/22/2018 0819   CREATININE 0.8 01/17/2017 1003      Component Value Date/Time   CALCIUM 8.1 (L) 10/22/2018 0819   CALCIUM 8.7 01/17/2017 1003    ALKPHOS 78 10/22/2018 0819   ALKPHOS 50 01/17/2017 1003   AST 31 10/22/2018 0819   AST 23 01/17/2017 1003   ALT 27 10/22/2018 0819   ALT 27 01/17/2017 1003   BILITOT 0.2 (L) 10/22/2018 0819   BILITOT 0.46 01/17/2017 1003       RADIOGRAPHIC STUDIES: No results found.  ASSESSMENT AND PLAN:  This is a very pleasant 48 years old Asian female with stage IV non-small cell lung cancer, adenocarcinoma with positive EGFR mutation with deletion in exon 72 diagnosed in July 2016 status post treatment with Tarceva for a total of 18 months but this was discontinued secondary to disease progression and development of T790M resistant mutation. The patient was started on treatment with Tagrisso 80 mg by mouth daily status post 29 months of treatment. She was also found to have MET amplification on repeat molecular studies. In addition to [place], the patient was a started on Xalkori 250 mg p.o. twice daily.  She has some side effect of this treatment with increasing fatigue and weakness as well as nausea and vomiting.  Hulda Humphrey was a change it to 250 once daily at nighttime  The patient has been tolerating this treatment well with no concerning complaints. I recommended for her to continue her current treatment with Tagrisso and Xalkori with the same dose. I will see her back for follow-up visit in 2 months for evaluation with repeat CT scan of the chest, abdomen and pelvis for restaging of her disease. For the deep venous thrombosis of the left lower extremities, the patient will continue her current treatment with Xalkori 20 mg p.o. daily.  We will hold her treatment if she has any significant bleeding. For the metastatic bone disease, she will continue her current treatment with Xgeva every 6 weeks.  She was advised to call immediately if she has any concerning symptoms in the interval. The patient voices understanding of current disease status and treatment options and is in agreement with the  current care plan. All questions were answered. The patient knows to call the clinic with any problems, questions or concerns. We can certainly see the patient much sooner if necessary.  Disclaimer: This note was dictated with voice recognition software. Similar sounding words can inadvertently be transcribed and may not  be corrected upon review.

## 2018-10-22 NOTE — Telephone Encounter (Signed)
Scheduled per 10/07 los, patient received calender and after visit summary.

## 2018-11-12 MED FILL — TAGRISSO 80 MG TABLET: 80 | 30 days supply | Qty: 30 | Fill #6

## 2018-11-12 MED FILL — XALKORI 250 MG CAPSULE: 250 | 30 days supply | Qty: 60 | Fill #1

## 2018-11-12 MED FILL — levETIRAcetam 500 MG TABS: 500 | 14 days supply | Qty: 56 | Fill #5

## 2018-11-12 MED FILL — XARELTO 20 MG TABLET: 20 | 30 days supply | Qty: 30 | Fill #1

## 2018-11-19 ENCOUNTER — Other Ambulatory Visit: Payer: Self-pay

## 2018-11-19 ENCOUNTER — Encounter (HOSPITAL_COMMUNITY): Payer: Self-pay

## 2018-11-19 ENCOUNTER — Ambulatory Visit (HOSPITAL_COMMUNITY)
Admission: RE | Admit: 2018-11-19 | Discharge: 2018-11-19 | Disposition: A | Discharge status: Home / Self Care | Payer: Medicare Other | Source: Ambulatory Visit | Attending: Internal Medicine | Admitting: Internal Medicine

## 2018-11-19 DIAGNOSIS — C3490 Malignant neoplasm of unspecified part of unspecified bronchus or lung: Secondary | ICD-10-CM | POA: Diagnosis present

## 2018-11-19 MED ORDER — SODIUM CHLORIDE (PF) 0.9 % IJ SOLN
INTRAMUSCULAR | Status: AC
  Filled 2018-11-19: qty 50

## 2018-11-19 MED ORDER — IOHEXOL 300 MG/ML  SOLN
75.0000 mL | Freq: Once | INTRAMUSCULAR | Status: AC | PRN
  Administered 2018-11-19: 75 mL via INTRAVENOUS

## 2018-12-03 ENCOUNTER — Other Ambulatory Visit: Payer: Self-pay

## 2018-12-03 ENCOUNTER — Inpatient Hospital Stay: Payer: Medicare Other | Attending: Internal Medicine

## 2018-12-03 VITALS — BP 99/45 | HR 98 | Temp 97.8°F | Resp 18

## 2018-12-03 DIAGNOSIS — C7972 Secondary malignant neoplasm of left adrenal gland: Secondary | ICD-10-CM | POA: Diagnosis not present

## 2018-12-03 DIAGNOSIS — E889 Metabolic disorder, unspecified: Secondary | ICD-10-CM

## 2018-12-03 DIAGNOSIS — C3491 Malignant neoplasm of unspecified part of right bronchus or lung: Secondary | ICD-10-CM

## 2018-12-03 DIAGNOSIS — C3431 Malignant neoplasm of lower lobe, right bronchus or lung: Secondary | ICD-10-CM

## 2018-12-03 DIAGNOSIS — C7931 Secondary malignant neoplasm of brain: Secondary | ICD-10-CM | POA: Insufficient documentation

## 2018-12-03 DIAGNOSIS — C342 Malignant neoplasm of middle lobe, bronchus or lung: Secondary | ICD-10-CM | POA: Diagnosis not present

## 2018-12-03 DIAGNOSIS — C7951 Secondary malignant neoplasm of bone: Secondary | ICD-10-CM

## 2018-12-03 DIAGNOSIS — M898X9 Other specified disorders of bone, unspecified site: Secondary | ICD-10-CM

## 2018-12-03 MED ORDER — DENOSUMAB 120 MG/1.7ML ~~LOC~~ SOLN
120.0000 mg | Freq: Once | SUBCUTANEOUS | Status: AC
  Administered 2018-12-03: 120 mg via SUBCUTANEOUS

## 2018-12-03 MED ORDER — DENOSUMAB 120 MG/1.7ML ~~LOC~~ SOLN
SUBCUTANEOUS | Status: AC
  Filled 2018-12-03: qty 1.7

## 2018-12-03 NOTE — Patient Instructions (Signed)
Denosumab injection What is this medicine? DENOSUMAB (den oh sue mab) slows bone breakdown. Prolia is used to treat osteoporosis in women after menopause and in men, and in people who are taking corticosteroids for 6 months or more. Xgeva is used to treat a high calcium level due to cancer and to prevent bone fractures and other bone problems caused by multiple myeloma or cancer bone metastases. Xgeva is also used to treat giant cell tumor of the bone. This medicine may be used for other purposes; ask your health care provider or pharmacist if you have questions. COMMON BRAND NAME(S): Prolia, XGEVA What should I tell my health care provider before I take this medicine? They need to know if you have any of these conditions:  dental disease  having surgery or tooth extraction  infection  kidney disease  low levels of calcium or Vitamin D in the blood  malnutrition  on hemodialysis  skin conditions or sensitivity  thyroid or parathyroid disease  an unusual reaction to denosumab, other medicines, foods, dyes, or preservatives  pregnant or trying to get pregnant  breast-feeding How should I use this medicine? This medicine is for injection under the skin. It is given by a health care professional in a hospital or clinic setting. A special MedGuide will be given to you before each treatment. Be sure to read this information carefully each time. For Prolia, talk to your pediatrician regarding the use of this medicine in children. Special care may be needed. For Xgeva, talk to your pediatrician regarding the use of this medicine in children. While this drug may be prescribed for children as young as 13 years for selected conditions, precautions do apply. Overdosage: If you think you have taken too much of this medicine contact a poison control center or emergency room at once. NOTE: This medicine is only for you. Do not share this medicine with others. What if I miss a dose? It is  important not to miss your dose. Call your doctor or health care professional if you are unable to keep an appointment. What may interact with this medicine? Do not take this medicine with any of the following medications:  other medicines containing denosumab This medicine may also interact with the following medications:  medicines that lower your chance of fighting infection  steroid medicines like prednisone or cortisone This list may not describe all possible interactions. Give your health care provider a list of all the medicines, herbs, non-prescription drugs, or dietary supplements you use. Also tell them if you smoke, drink alcohol, or use illegal drugs. Some items may interact with your medicine. What should I watch for while using this medicine? Visit your doctor or health care professional for regular checks on your progress. Your doctor or health care professional may order blood tests and other tests to see how you are doing. Call your doctor or health care professional for advice if you get a fever, chills or sore throat, or other symptoms of a cold or flu. Do not treat yourself. This drug may decrease your body's ability to fight infection. Try to avoid being around people who are sick. You should make sure you get enough calcium and vitamin D while you are taking this medicine, unless your doctor tells you not to. Discuss the foods you eat and the vitamins you take with your health care professional. See your dentist regularly. Brush and floss your teeth as directed. Before you have any dental work done, tell your dentist you are   receiving this medicine. Do not become pregnant while taking this medicine or for 5 months after stopping it. Talk with your doctor or health care professional about your birth control options while taking this medicine. Women should inform their doctor if they wish to become pregnant or think they might be pregnant. There is a potential for serious side  effects to an unborn child. Talk to your health care professional or pharmacist for more information. What side effects may I notice from receiving this medicine? Side effects that you should report to your doctor or health care professional as soon as possible:  allergic reactions like skin rash, itching or hives, swelling of the face, lips, or tongue  bone pain  breathing problems  dizziness  jaw pain, especially after dental work  redness, blistering, peeling of the skin  signs and symptoms of infection like fever or chills; cough; sore throat; pain or trouble passing urine  signs of low calcium like fast heartbeat, muscle cramps or muscle pain; pain, tingling, numbness in the hands or feet; seizures  unusual bleeding or bruising  unusually weak or tired Side effects that usually do not require medical attention (report to your doctor or health care professional if they continue or are bothersome):  constipation  diarrhea  headache  joint pain  loss of appetite  muscle pain  runny nose  tiredness  upset stomach This list may not describe all possible side effects. Call your doctor for medical advice about side effects. You may report side effects to FDA at 1-800-FDA-1088. Where should I keep my medicine? This medicine is only given in a clinic, doctor's office, or other health care setting and will not be stored at home. NOTE: This sheet is a summary. It may not cover all possible information. If you have questions about this medicine, talk to your doctor, pharmacist, or health care provider.  2020 Elsevier/Gold Standard (2017-05-10 16:10:44)

## 2018-12-05 ENCOUNTER — Other Ambulatory Visit: Payer: Self-pay | Admitting: Physician Assistant

## 2018-12-05 ENCOUNTER — Other Ambulatory Visit: Payer: Self-pay | Admitting: Internal Medicine

## 2018-12-05 DIAGNOSIS — I82442 Acute embolism and thrombosis of left tibial vein: Secondary | ICD-10-CM

## 2018-12-08 MED FILL — XALKORI 250 MG CAPSULE: 250 | 30 days supply | Qty: 60 | Fill #2

## 2018-12-08 MED FILL — levETIRAcetam 500 MG TABS: 500 | 14 days supply | Qty: 56 | Fill #0

## 2018-12-08 MED FILL — XARELTO 20 MG TABLET: 20 | 30 days supply | Qty: 30 | Fill #0

## 2018-12-08 MED FILL — TAGRISSO 80 MG TABLET: 80 | 30 days supply | Qty: 30 | Fill #7

## 2018-12-22 ENCOUNTER — Inpatient Hospital Stay: Payer: Medicare Other | Attending: Internal Medicine

## 2018-12-22 ENCOUNTER — Other Ambulatory Visit: Payer: Self-pay

## 2018-12-22 DIAGNOSIS — C7951 Secondary malignant neoplasm of bone: Secondary | ICD-10-CM | POA: Diagnosis not present

## 2018-12-22 DIAGNOSIS — K5900 Constipation, unspecified: Secondary | ICD-10-CM | POA: Insufficient documentation

## 2018-12-22 DIAGNOSIS — Z86718 Personal history of other venous thrombosis and embolism: Secondary | ICD-10-CM | POA: Diagnosis not present

## 2018-12-22 DIAGNOSIS — C7931 Secondary malignant neoplasm of brain: Secondary | ICD-10-CM | POA: Diagnosis not present

## 2018-12-22 DIAGNOSIS — R5383 Other fatigue: Secondary | ICD-10-CM | POA: Insufficient documentation

## 2018-12-22 DIAGNOSIS — C3490 Malignant neoplasm of unspecified part of unspecified bronchus or lung: Secondary | ICD-10-CM

## 2018-12-22 DIAGNOSIS — Z79899 Other long term (current) drug therapy: Secondary | ICD-10-CM | POA: Insufficient documentation

## 2018-12-22 DIAGNOSIS — C7970 Secondary malignant neoplasm of unspecified adrenal gland: Secondary | ICD-10-CM | POA: Diagnosis not present

## 2018-12-22 DIAGNOSIS — C342 Malignant neoplasm of middle lobe, bronchus or lung: Secondary | ICD-10-CM | POA: Insufficient documentation

## 2018-12-22 LAB — CBC WITH DIFFERENTIAL (CANCER CENTER ONLY)
Abs Immature Granulocytes: 0.01 10*3/uL (ref 0.00–0.07)
Basophils Absolute: 0 10*3/uL (ref 0.0–0.1)
Basophils Relative: 1 %
Eosinophils Absolute: 0.1 10*3/uL (ref 0.0–0.5)
Eosinophils Relative: 4 %
HCT: 35.5 % — ABNORMAL LOW (ref 36.0–46.0)
Hemoglobin: 11.7 g/dL — ABNORMAL LOW (ref 12.0–15.0)
Immature Granulocytes: 0 %
Lymphocytes Relative: 22 %
Lymphs Abs: 0.6 10*3/uL — ABNORMAL LOW (ref 0.7–4.0)
MCH: 32.3 pg (ref 26.0–34.0)
MCHC: 33 g/dL (ref 30.0–36.0)
MCV: 98.1 fL (ref 80.0–100.0)
Monocytes Absolute: 0.4 10*3/uL (ref 0.1–1.0)
Monocytes Relative: 16 %
Neutro Abs: 1.6 10*3/uL — ABNORMAL LOW (ref 1.7–7.7)
Neutrophils Relative %: 57 %
Platelet Count: 284 10*3/uL (ref 150–400)
RBC: 3.62 MIL/uL — ABNORMAL LOW (ref 3.87–5.11)
RDW: 13.9 % (ref 11.5–15.5)
WBC Count: 2.8 10*3/uL — ABNORMAL LOW (ref 4.0–10.5)
nRBC: 0 % (ref 0.0–0.2)

## 2018-12-22 LAB — CMP (CANCER CENTER ONLY)
ALT: 26 U/L (ref 0–44)
AST: 29 U/L (ref 15–41)
Albumin: 3.3 g/dL — ABNORMAL LOW (ref 3.5–5.0)
Alkaline Phosphatase: 89 U/L (ref 38–126)
Anion gap: 6 (ref 5–15)
BUN: 15 mg/dL (ref 6–20)
CO2: 27 mmol/L (ref 22–32)
Calcium: 7.7 mg/dL — ABNORMAL LOW (ref 8.9–10.3)
Chloride: 108 mmol/L (ref 98–111)
Creatinine: 0.73 mg/dL (ref 0.44–1.00)
GFR, Est AFR Am: >60 mL/min (ref >60)
GFR, Estimated: >60 mL/min (ref >60)
Glucose, Bld: 93 mg/dL (ref 70–99)
Potassium: 4.4 mmol/L (ref 3.5–5.1)
Sodium: 141 mmol/L (ref 135–145)
Total Bilirubin: 0.2 mg/dL — ABNORMAL LOW (ref 0.3–1.2)
Total Protein: 6.2 g/dL — ABNORMAL LOW (ref 6.5–8.1)

## 2018-12-24 ENCOUNTER — Other Ambulatory Visit: Payer: Self-pay | Admitting: Radiation Therapy

## 2018-12-25 ENCOUNTER — Encounter: Payer: Self-pay | Admitting: Internal Medicine

## 2018-12-25 ENCOUNTER — Other Ambulatory Visit: Payer: Self-pay

## 2018-12-25 ENCOUNTER — Telehealth: Payer: Self-pay | Admitting: Internal Medicine

## 2018-12-25 ENCOUNTER — Encounter: Payer: Self-pay | Admitting: *Deleted

## 2018-12-25 ENCOUNTER — Inpatient Hospital Stay (HOSPITAL_BASED_OUTPATIENT_CLINIC_OR_DEPARTMENT_OTHER): Payer: Medicare Other | Admitting: Internal Medicine

## 2018-12-25 VITALS — BP 96/57 | HR 100 | Temp 98.0°F | Resp 19 | Ht 59.0 in | Wt 94.2 lb

## 2018-12-25 DIAGNOSIS — C7951 Secondary malignant neoplasm of bone: Secondary | ICD-10-CM

## 2018-12-25 DIAGNOSIS — Z5111 Encounter for antineoplastic chemotherapy: Secondary | ICD-10-CM | POA: Diagnosis not present

## 2018-12-25 DIAGNOSIS — C7931 Secondary malignant neoplasm of brain: Secondary | ICD-10-CM

## 2018-12-25 DIAGNOSIS — C3491 Malignant neoplasm of unspecified part of right bronchus or lung: Secondary | ICD-10-CM

## 2018-12-25 DIAGNOSIS — C342 Malignant neoplasm of middle lobe, bronchus or lung: Secondary | ICD-10-CM | POA: Diagnosis not present

## 2018-12-25 NOTE — Telephone Encounter (Signed)
Scheduled appt per 12/10 los -gave patient AVS and calender per los.

## 2018-12-25 NOTE — Progress Notes (Signed)
Oncology Nurse Navigator Documentation  Oncology Nurse Navigator Flowsheets 12/25/2018  Navigator Location [facility]  Navigator Encounter Type Clinic/[facility]C  Telephone -  Patient Visit Type MedOnc/I followed up with Deborah Foster today at clinic.  She is doing well with no complaints.  She is combination oral biologic and understands treatment plan.  Her [place] translator was present.   Treatment Phase Treatment  Barriers/Navigation Needs No Questions  Education -  Interventions -  Acuity -  Coordination of Care -  Education Method -  Time Spent with Patient 15

## 2018-12-25 NOTE — Progress Notes (Signed)
[facility] Telephone:([phone]) [phone]   Fax:(336) [phone]  OFFICE PROGRESS NOTE  [name], MD [place] [address]  DIAGNOSIS:  1) Stage IV (T2a, N1, M1b) non-small cell lung cancer, adenocarcinoma with positive EGFR mutation (deletion 73) presented with right middle lobe lung mass, right hilar adenopathy as well as metastatic disease to the bone, brain and left adrenal diagnosed in July 2016. 2) deep venous thrombosis of the left lower extremity diagnosed in March 2020  PRIOR THERAPY: 1) palliative radiotherapy to the brain as well as metastatic bone lesions in the pelvis. 2) Tarceva 150 mg by mouth daily started on 09/25/2014. Status post 5 months of treatment. 3) status post palliative radiotherapy to the right middle lobe lung mass for treatment of hemoptysis. 4) Tarceva 100 mg by mouth daily started 02/19/2015 status post 13 months of treatment. This was discontinued in May 2018 after the patient had disease progression and development of T790M resistant mutation.  5) palliative stereotactic radiotherapy to the left adrenal gland metastatic lesion under the care of Dr. Lisbeth Renshaw completed on April 10, 2017.  CURRENT THERAPY: 1) Tagrisso 80 mg by mouth daily started 06/02/2016.  Status post 31 months of treatment. 2) Xgeva 120 g subcutaneously every 6 weeks. 3) Xalkori 250 mg p.o. twice daily started 05/08/2018.  This was a change it to once daily in July 2020 secondary intolerant intolerance.  INTERVAL HISTORY: Deborah Foster 48 y.o. female returns to the clinic today for follow-up visit accompanied by her [place] interpreter.  The patient is feeling fine today with no concerning complaints.  She denied having any current chest pain, shortness of breath, cough or hemoptysis.  She denied having any fever or chills.  She has no nausea, vomiting, diarrhea but has constipation and she is using MiraLAX on as-needed basis.  She denied having  any significant weight loss or night sweats.  She has no headache or visual changes.  She continues to tolerate her treatment with Tagrisso as well as Xalkori fairly well.  She had repeat CT scan of the chest, abdomen pelvis performed last month and the patient is here today for evaluation and discussion of her scan results and treatment options.   MEDICAL HISTORY: Past Medical History:  Diagnosis Date  . Bone metastasis ([place])   . Hemoptysis   . Hypokalemia   . lung ca dx'd 07/2014  . Lung mass   . Metastasis to adrenal gland ([place])   . Metastasis to brain ([place])   . Pneumonia   . Radiation 08/23/14-09/07/14   Brain/chest and left hip 30 Gy 12 Fx  . URI (upper respiratory infection) 02-02-2015    ALLERGIES:  is allergic to other and doxycycline.  MEDICATIONS:  Current Outpatient Medications  Medication Sig Dispense Refill  . AMBULATORY NON FORMULARY MEDICATION Medication Name: MAGIC MOUTHWASH 2 % Viscous Lidocaine  Maalox Benadryl Susp.  Disp: 1:1:1 226m bottle Sig: 169mPO Swish & Spit  q 3-4hrs. PRN mouth sores 200 mL 3  . benzonatate (TESSALON) 200 MG capsule TK ONE C PO Q 8 H PRN COU  0  . Calcium Carbonate-Vitamin D (CALCIUM 500 + D) 500-125 MG-UNIT TABS Take 500 mg by mouth 2 (two) times daily.    . chlorpheniramine-HYDROcodone (TUSSIONEX) 10-8 MG/5ML SUER Take 5 mLs by mouth every 12 (twelve) hours as needed. 140 mL 0  . clindamycin (CLINDAGEL) 1 % gel Apply 1 application topically 2 (two) times daily.    .Marland Kitchen  clobetasol cream (TEMOVATE) 6.64 % Apply 1 application topically 2 (two) times daily.    . Cyanocobalamin (VITAMIN B-12) 2500 MCG SUBL Place 2,500 mcg under the tongue daily.    Marland Kitchen Dextromethorphan Polistirex (DELSYM PO) Take by mouth.    . ENSURE (ENSURE) Take 1 Can by mouth 2 (two) times daily between meals.     . ferrous sulfate 325 (65 FE) MG EC tablet Take 325 mg by mouth daily with breakfast.    . fluticasone (VERAMYST) 27.5 MCG/SPRAY nasal spray Place 2 sprays into  the nose daily.    . folic acid (FOLVITE) 403 MCG tablet Take 1 tablet (800 mcg total) by mouth daily. 30 tablet 1  . levETIRAcetam (KEPPRA) 500 MG tablet TAKE 2 TABLETS (1,000 MG TOTAL) BY MOUTH 2 TIMES DAILY FOR 14 DAYS. 56 tablet 0  . levETIRAcetam (KEPPRA) 500 MG tablet     . levETIRAcetam (KEPPRA) 500 MG tablet TAKE 2 TABLETS BY MOUTH TWICE DAILY FOR 14 DAYS 56 tablet 5  . loratadine (CLARITIN) 10 MG tablet Take 1 tablet (10 mg total) by mouth daily. 30 tablet 0  . mirtazapine (REMERON) 15 MG tablet Take 1 tablet (15 mg total) by mouth at bedtime. 30 tablet 0  . Nutritional Supplements (JUICE PLUS FIBRE PO) Take 1 capsule by mouth daily. Reported on 07/28/2015    . ondansetron (ZOFRAN) 8 MG tablet TAKE 1 TABLET (8 MG TOTAL) BY MOUTH EVERY 8 HOURS AS NEEDED FOR NAUSEA OR VOMITING. 30 tablet 0  . OVER THE COUNTER MEDICATION "Deep penetrating pain relief oil"    . oxyCODONE-acetaminophen (PERCOCET/ROXICET) 5-325 MG tablet Take 1 tablet by mouth every 6 (six) hours as needed for severe pain. 60 tablet 0  . oxyCODONE-acetaminophen (PERCOCET/ROXICET) 5-325 MG tablet     . prochlorperazine (COMPAZINE) 10 MG tablet TAKE 1 TABLET BY MOUTH EVERY 6 HOURS AS NEEDED FOR NAUSEA OR VOMITING. 30 tablet 0  . ranitidine (ZANTAC) 300 MG tablet Take 1 tablet (300 mg total) by mouth at bedtime. 30 tablet 6  . TAGRISSO 80 MG tablet TAKE 1 TABLET (80 MG TOTAL) BY MOUTH DAILY. 30 tablet 11  . traMADol (ULTRAM) 50 MG tablet TAKE 1 TO 2 TABLETS BY MOUTH EVERY 4 HOURS AS NEEDED FOR PAIN FOR PAIN(MAX 8 TABS IN A DAY)    . TURMERIC PO Take 1 capsule by mouth 2 (two) times daily. Turmeric Superior 750 mg po bid    . XALKORI 250 MG capsule     . XALKORI 250 MG capsule TAKE 1 CAPSULE (250 MG TOTAL) BY MOUTH 2 TIMES DAILY. 60 capsule 2  . XARELTO 20 MG TABS tablet TAKE 1 TABLET (20 MG TOTAL) BY MOUTH DAILY WITH SUPPER. 30 tablet 1   No current facility-administered medications for this visit.    SURGICAL HISTORY:  Past  Surgical History:  Procedure Laterality Date  . IR GENERIC HISTORICAL  07/14/2015   IR RADIOLOGIST EVAL & MGMT 07/14/2015 Greggory Keen, MD [facility] INTERV RAD  . VIDEO BRONCHOSCOPY Bilateral 08/09/2014   Procedure: VIDEO BRONCHOSCOPY WITHOUT FLUORO;  Surgeon: [name], MD;  Location: [facility] ENDOSCOPY;  Service: Cardiopulmonary;  Laterality: Bilateral;  . VIDEO BRONCHOSCOPY Bilateral 07/06/2015   Procedure: VIDEO BRONCHOSCOPY WITHOUT FLUORO;  Surgeon: [name], MD;  Location: [facility] ENDOSCOPY;  Service: Cardiopulmonary;  Laterality: Bilateral;    REVIEW OF SYSTEMS:  Constitutional: positive for fatigue Eyes: negative Ears, nose, mouth, throat, and face: negative Respiratory: negative Cardiovascular: negative Gastrointestinal: positive for constipation Genitourinary:negative Integument/breast:  negative Hematologic/lymphatic: negative Musculoskeletal:negative Neurological: negative Behavioral/Psych: negative Endocrine: negative Allergic/Immunologic: negative   PHYSICAL EXAMINATION: General appearance: alert, cooperative, fatigued and no distress Head: Normocephalic, without obvious abnormality, atraumatic Neck: no adenopathy, no JVD, supple, symmetrical, trachea midline and thyroid not enlarged, symmetric, no tenderness/mass/nodules Lymph nodes: Cervical, supraclavicular, and axillary nodes normal. Resp: clear to auscultation bilaterally Back: symmetric, no curvature. ROM normal. No CVA tenderness. Cardio: regular rate and rhythm, S1, S2 normal, no murmur, click, rub or gallop GI: soft, non-tender; bowel sounds normal; no masses,  no organomegaly Extremities: extremities normal, atraumatic, no cyanosis or edema Neurologic: Alert and oriented X 3, normal strength and tone. Normal symmetric reflexes. Normal coordination and gait  ECOG PERFORMANCE STATUS: 1 - Symptomatic but completely ambulatory  Blood pressure (!) 96/57, pulse 100, temperature 98 F (36.7 C), temperature  source Temporal, resp. rate 19, height _0  (1.499 m), weight 94 lb 3.2 oz (42.7 kg), SpO2 100 %.  LABORATORY DATA: Lab Results  Component Value Date   WBC 2.8 (L) 12/22/2018   HGB 11.7 (L) 12/22/2018   HCT 35.5 (L) 12/22/2018   MCV 98.1 12/22/2018   PLT 284 12/22/2018      Chemistry      Component Value Date/Time   NA 141 12/22/2018 0909   NA 140 01/17/2017 1003   K 4.4 12/22/2018 0909   K 3.9 01/17/2017 1003   CL 108 12/22/2018 0909   CO2 27 12/22/2018 0909   CO2 29 01/17/2017 1003   BUN 15 12/22/2018 0909   BUN 14.8 01/17/2017 1003   CREATININE 0.73 12/22/2018 0909   CREATININE 0.8 01/17/2017 1003      Component Value Date/Time   CALCIUM 7.7 (L) 12/22/2018 0909   CALCIUM 8.7 01/17/2017 1003   ALKPHOS 89 12/22/2018 0909   ALKPHOS 50 01/17/2017 1003   AST 29 12/22/2018 0909   AST 23 01/17/2017 1003   ALT 26 12/22/2018 0909   ALT 27 01/17/2017 1003   BILITOT 0.2 (L) 12/22/2018 0909   BILITOT 0.46 01/17/2017 1003       RADIOGRAPHIC STUDIES: No results found.  ASSESSMENT AND PLAN:  This is a very pleasant 48 years old Asian female with stage IV non-small cell lung cancer, adenocarcinoma with positive EGFR mutation with deletion in exon 43 diagnosed in July 2016 status post treatment with Tarceva for a total of 18 months but this was discontinued secondary to disease progression and development of T790M resistant mutation. The patient was started on treatment with Tagrisso 80 mg by mouth daily status post 31 months of treatment. She was also found to have MET amplification on repeat molecular studies. In addition to [place], the patient was a started on Xalkori 250 mg p.o. twice daily.  She has some side effect of this treatment with increasing fatigue and weakness as well as nausea and vomiting.  Hulda Humphrey was a change it to 250 once daily at nighttime  The patient continues to tolerate this combination treatment fairly well with no concerning adverse effects. She  had repeat CT scan of the chest, abdomen pelvis performed in early November 2020. I personally and independently reviewed the scans and discussed the results with the patient today. Her scan showed no concerning findings for disease progression. I recommended for the patient to continue her current treatment with Tagrisso as well as Xalkori as planned. For the deep venous thrombosis of the left lower extremities, the patient will continue on her treatment with Xarelto 20 mg p.o. daily. For the metastatic  bone disease she will continue on her current treatment with Xgeva every 6 weeks. The patient will come back for follow-up visit in 2 months for evaluation and repeat blood work. She was advised to call immediately if she has any concerning symptoms in the interval. The patient voices understanding of current disease status and treatment options and is in agreement with the current care plan. All questions were answered. The patient knows to call the clinic with any problems, questions or concerns. We can certainly see the patient much sooner if necessary.  Disclaimer: This note was dictated with voice recognition software. Similar sounding words can inadvertently be transcribed and may not be corrected upon review.

## 2019-01-02 ENCOUNTER — Other Ambulatory Visit: Payer: Self-pay | Admitting: Internal Medicine

## 2019-01-06 MED FILL — levETIRAcetam 500 MG TABS: 500 | 14 days supply | Qty: 56 | Fill #1

## 2019-01-06 MED FILL — XALKORI 250 MG CAPSULE: 250 | 30 days supply | Qty: 60 | Fill #0

## 2019-01-06 MED FILL — TAGRISSO 80 MG TABLET: 80 | 30 days supply | Qty: 30 | Fill #8

## 2019-01-06 MED FILL — XARELTO 20 MG TABLET: 20 | 30 days supply | Qty: 30 | Fill #1

## 2019-01-14 ENCOUNTER — Inpatient Hospital Stay: Payer: Medicare Other

## 2019-02-03 MED FILL — XALKORI 250 MG CAPSULE: 250 | 30 days supply | Qty: 60 | Fill #1

## 2019-02-03 MED FILL — TAGRISSO 80 MG TABLET: 80 | 30 days supply | Qty: 30 | Fill #9

## 2019-02-06 ENCOUNTER — Ambulatory Visit
Admission: RE | Admit: 2019-02-06 | Discharge: 2019-02-06 | Disposition: A | Discharge status: Home / Self Care | Payer: Medicare Other | Source: Ambulatory Visit | Attending: Internal Medicine | Admitting: Internal Medicine

## 2019-02-06 DIAGNOSIS — C7931 Secondary malignant neoplasm of brain: Secondary | ICD-10-CM

## 2019-02-06 MED ORDER — GADOBENATE DIMEGLUMINE 529 MG/ML IV SOLN
8.0000 mL | Freq: Once | INTRAVENOUS | Status: AC | PRN
  Administered 2019-02-06: 8 mL via INTRAVENOUS

## 2019-02-10 ENCOUNTER — Other Ambulatory Visit: Payer: Self-pay

## 2019-02-10 ENCOUNTER — Inpatient Hospital Stay: Payer: Medicare Other | Attending: Internal Medicine | Admitting: Internal Medicine

## 2019-02-10 VITALS — BP 101/61 | HR 108 | Temp 98.0°F | Resp 18 | Ht 59.0 in | Wt 93.6 lb

## 2019-02-10 DIAGNOSIS — H538 Other visual disturbances: Secondary | ICD-10-CM | POA: Diagnosis not present

## 2019-02-10 DIAGNOSIS — R200 Anesthesia of skin: Secondary | ICD-10-CM | POA: Diagnosis not present

## 2019-02-10 DIAGNOSIS — R5383 Other fatigue: Secondary | ICD-10-CM | POA: Diagnosis not present

## 2019-02-10 DIAGNOSIS — Z7901 Long term (current) use of anticoagulants: Secondary | ICD-10-CM | POA: Diagnosis not present

## 2019-02-10 DIAGNOSIS — C7931 Secondary malignant neoplasm of brain: Secondary | ICD-10-CM | POA: Diagnosis not present

## 2019-02-10 DIAGNOSIS — Z79899 Other long term (current) drug therapy: Secondary | ICD-10-CM | POA: Diagnosis not present

## 2019-02-10 DIAGNOSIS — C342 Malignant neoplasm of middle lobe, bronchus or lung: Secondary | ICD-10-CM | POA: Diagnosis present

## 2019-02-10 NOTE — Progress Notes (Signed)
[facility] [place], [place] [address] [phone]   Interval Evaluation  Date of Service: 02/10/19 Patient Name: Deborah Foster Patient MRN: [id number] Patient DOB: 1970/09/02 Provider: Ventura Sellers, MD  Identifying Statement:  Deborah Foster is a 49 y.o. female with Metastasis to brain [facility]) [C79.31]   Primary Cancer: Lung adeno w/ EGFR mutation  CNS Oncologic History: 08/23/14-09/07/14:  WBRT 30 Gy in 12 fractions  Interval History:  Deborah Foster presents today following MRI brain.  She describes stability of visual episodes.  Lethargy and appetite are stable.  No gait issues or falls.  Still on crizotinib and tagrisso with Dr. Julien Nordmann.  H+P (05/19/18) Patient presents today to review clinical changes and recent MRI brain.  She describes onset of visual changes, described primarily as "episodes of flashing lights".  These episodes occur roughly daily, and tend to last for ~5 minutes before resolving spontaneously.  First episode occurred roughly two weeks ago, very soon after starting crizotinib for progressive EGFR mutant lung adenocarcinoma.  Otherwise vision is not clearly disturbed, maintains full functional independence.  She continues on Tagrisso as well.  She also describes intermittent medial arm numbness, described as "pins and needles" that usually is only symptomatic in the evening.  This has been stable over the past 1 year.    Medications: Current Outpatient Medications on File Prior to Visit  Medication Sig Dispense Refill  . AMBULATORY NON FORMULARY MEDICATION Medication Name: MAGIC MOUTHWASH 2 % Viscous Lidocaine  Maalox Benadryl Susp.  Disp: 1:1:1 247m bottle Sig: 165mPO Swish & Spit  q 3-4hrs. PRN mouth sores 200 mL 3  . Calcium Carbonate-Vitamin D (CALCIUM 500 + D) 500-125 MG-UNIT TABS Take 500 mg by mouth 2 (two) times daily.    . clindamycin (CLINDAGEL) 1 % gel Apply 1 application topically 2 (two) times daily.     . clobetasol cream (TEMOVATE) 0.9.32 Apply 1 application topically 2 (two) times daily.    . Cyanocobalamin (VITAMIN B-12) 2500 MCG SUBL Place 2,500 mcg under the tongue daily.    . Marland Kitchenextromethorphan Polistirex (DELSYM PO) Take by mouth.    . ENSURE (ENSURE) Take 1 Can by mouth 2 (two) times daily between meals.     . ferrous sulfate 325 (65 FE) MG EC tablet Take 325 mg by mouth daily with breakfast.    . fluticasone (VERAMYST) 27.5 MCG/SPRAY nasal spray Place 2 sprays into the nose daily.    . folic acid (FOLVITE) 80355CG tablet Take 1 tablet (800 mcg total) by mouth daily. 30 tablet 1  . levETIRAcetam (KEPPRA) 500 MG tablet TAKE 2 TABLETS (1,000 MG TOTAL) BY MOUTH 2 TIMES DAILY FOR 14 DAYS. 56 tablet 0  . levETIRAcetam (KEPPRA) 500 MG tablet TAKE 2 TABLETS BY MOUTH TWICE DAILY FOR 14 DAYS 56 tablet 5  . loratadine (CLARITIN) 10 MG tablet Take 1 tablet (10 mg total) by mouth daily. 30 tablet 0  . mirtazapine (REMERON) 15 MG tablet Take 1 tablet (15 mg total) by mouth at bedtime. 30 tablet 0  . Nutritional Supplements (JUICE PLUS FIBRE PO) Take 1 capsule by mouth daily. Reported on 07/28/2015    . OVER THE COUNTER MEDICATION "Deep penetrating pain relief oil"    . oxyCODONE-acetaminophen (PERCOCET/ROXICET) 5-325 MG tablet Take 1 tablet by mouth every 6 (six) hours as needed for severe pain. 60 tablet 0  . oxyCODONE-acetaminophen (PERCOCET/ROXICET) 5-325 MG tablet     .  prochlorperazine (COMPAZINE) 10 MG tablet TAKE 1 TABLET BY MOUTH EVERY 6 HOURS AS NEEDED FOR NAUSEA OR VOMITING. 30 tablet 0  . ranitidine (ZANTAC) 300 MG tablet Take 1 tablet (300 mg total) by mouth at bedtime. 30 tablet 6  . TAGRISSO 80 MG tablet TAKE 1 TABLET (80 MG TOTAL) BY MOUTH DAILY. 30 tablet 11  . TURMERIC PO Take 1 capsule by mouth 2 (two) times daily. Turmeric Superior 750 mg po bid    . XALKORI 250 MG capsule TAKE 1 CAPSULE (250 MG TOTAL) BY MOUTH 2 TIMES DAILY. 60 capsule 2  . XARELTO 20 MG TABS tablet TAKE 1 TABLET  (20 MG TOTAL) BY MOUTH DAILY WITH SUPPER. 30 tablet 1   No current facility-administered medications on file prior to visit.    Allergies:  Allergies  Allergen Reactions  . Other Other (See Comments)    Patient states that there is some she takes that makes her throat "cry" AND SCRATCHY   . Doxycycline Nausea And Vomiting   Past Medical History:  Past Medical History:  Diagnosis Date  . Bone metastasis ([place])   . Hemoptysis   . Hypokalemia   . lung ca dx'd 07/2014  . Lung mass   . Metastasis to adrenal gland ([place])   . Metastasis to brain ([place])   . Pneumonia   . Radiation 08/23/14-09/07/14   Brain/chest and left hip 30 Gy 12 Fx  . URI (upper respiratory infection) 02-02-2015   Past Surgical History:  Past Surgical History:  Procedure Laterality Date  . IR GENERIC HISTORICAL  07/14/2015   IR RADIOLOGIST EVAL & MGMT 07/14/2015 Greggory Keen, MD [facility] INTERV RAD  . VIDEO BRONCHOSCOPY Bilateral 08/09/2014   Procedure: VIDEO BRONCHOSCOPY WITHOUT FLUORO;  Surgeon: [name], MD;  Location: [facility] ENDOSCOPY;  Service: Cardiopulmonary;  Laterality: Bilateral;  . VIDEO BRONCHOSCOPY Bilateral 07/06/2015   Procedure: VIDEO BRONCHOSCOPY WITHOUT FLUORO;  Surgeon: [name], MD;  Location: [facility] ENDOSCOPY;  Service: Cardiopulmonary;  Laterality: Bilateral;   Social History:  Social History   Socioeconomic History  . Marital status: Married    Spouse name: Not on file  . Number of children: 3  . Years of education: Not on file  . Highest education level: Not on file  Occupational History  . Occupation: unemployed  Tobacco Use  . Smoking status: Never Smoker  . Smokeless tobacco: Never Used  Substance and Sexual Activity  . Alcohol use: No    Alcohol/week: 0.0 standard drinks  . Drug use: No  . Sexual activity: Not Currently  Other Topics Concern  . Not on file  Social History Narrative   Patient moved to [place] in [place].   Had been in [place] since that time.    Social Determinants of Health   Financial Resource Strain:   . Difficulty of Paying Living Expenses: Not on file  Food Insecurity:   . Worried About Charity fundraiser in the Last Year: Not on file  . Ran Out of Food in the Last Year: Not on file  Transportation Needs:   . Lack of Transportation (Medical): Not on file  . Lack of Transportation (Non-Medical): Not on file  Physical Activity:   . Days of Exercise per Week: Not on file  . Minutes of Exercise per Session: Not on file  Stress:   . Feeling of Stress : Not on file  Social Connections:   . Frequency of Communication with Friends and Family: Not on file  .  Frequency of Social Gatherings with Friends and Family: Not on file  . Attends Religious Services: Not on file  . Active Member of Clubs or Organizations: Not on file  . Attends Archivist Meetings: Not on file  . Marital Status: Not on file  Intimate Partner Violence:   . Fear of Current or Ex-Partner: Not on file  . Emotionally Abused: Not on file  . Physically Abused: Not on file  . Sexually Abused: Not on file   Family History:  Family History  Problem Relation Age of Onset  . Hyperlipidemia Mother     Review of Systems: Constitutional: Denies fevers, chills or abnormal weight loss Eyes: Denies blurriness of vision Ears, nose, mouth, throat, and face: Denies mucositis or sore throat Respiratory: Denies cough, dyspnea or wheezes Cardiovascular: Denies palpitation, chest discomfort or lower extremity swelling Gastrointestinal:  Denies nausea, constipation, diarrhea GU: Denies dysuria or incontinence Skin: Denies abnormal skin rashes Neurological: Per HPI Musculoskeletal: Denies joint pain, back or neck discomfort. No decrease in ROM Behavioral/Psych: Denies anxiety, disturbance in thought content, and mood instability   Physical Exam: Vitals:   02/10/19 0922  BP: 101/61  Pulse: (!) 108  Resp: 18  Temp: 98 F (36.7 C)  SpO2: 100%    KPS: 90. General: Alert, cooperative, pleasant, in no acute distress Head: Normal EENT: No conjunctival injection or scleral icterus. Oral mucosa moist Lungs: Resp effort normal Cardiac: Regular rate and rhythm Abdomen: Soft, non-distended abdomen Skin: No rashes cyanosis or petechiae. Extremities: No clubbing or edema  Neurologic Exam: Mental Status: Awake, alert, attentive to examiner. Oriented to self and environment. Language is fluent with intact comprehension.  Cranial Nerves: Visual acuity is grossly normal. Visual fields are full. Extra-ocular movements intact. No ptosis. Face is symmetric, tongue midline. Motor: Tone and bulk are normal. Power is full in both arms and legs. Reflexes are symmetric, no pathologic reflexes present. Intact finger to nose bilaterally Sensory: Normal Gait: Normal and tandem gait is normal.   Labs: I have reviewed the data as listed    Component Value Date/Time   NA 141 12/22/2018 0909   NA 140 01/17/2017 1003   K 4.4 12/22/2018 0909   K 3.9 01/17/2017 1003   CL 108 12/22/2018 0909   CO2 27 12/22/2018 0909   CO2 29 01/17/2017 1003   GLUCOSE 93 12/22/2018 0909   GLUCOSE 69 (L) 01/17/2017 1003   BUN 15 12/22/2018 0909   BUN 14.8 01/17/2017 1003   CREATININE 0.73 12/22/2018 0909   CREATININE 0.8 01/17/2017 1003   CALCIUM 7.7 (L) 12/22/2018 0909   CALCIUM 8.7 01/17/2017 1003   PROT 6.2 (L) 12/22/2018 0909   PROT 6.4 01/17/2017 1003   ALBUMIN 3.3 (L) 12/22/2018 0909   ALBUMIN 3.6 01/17/2017 1003   AST 29 12/22/2018 0909   AST 23 01/17/2017 1003   ALT 26 12/22/2018 0909   ALT 27 01/17/2017 1003   ALKPHOS 89 12/22/2018 0909   ALKPHOS 50 01/17/2017 1003   BILITOT 0.2 (L) 12/22/2018 0909   BILITOT 0.46 01/17/2017 1003   GFRNONAA >60 12/22/2018 0909   GFRAA >60 12/22/2018 0909   Lab Results  Component Value Date   WBC 2.8 (L) 12/22/2018   NEUTROABS 1.6 (L) 12/22/2018   HGB 11.7 (L) 12/22/2018   HCT 35.5 (L) 12/22/2018   MCV 98.1  12/22/2018   PLT 284 12/22/2018    Imaging:  MR Brain W Wo Contrast  Result Date: 02/06/2019 CLINICAL DATA:  Metastatic lung cancer  EXAM: MRI HEAD WITHOUT AND WITH CONTRAST TECHNIQUE: Multiplanar, multiecho pulse sequences of the brain and surrounding structures were obtained without and with intravenous contrast. CONTRAST:  55m MULTIHANCE GADOBENATE DIMEGLUMINE 529 MG/ML IV SOLN COMPARISON:  08/08/2018 FINDINGS: Brain: Stable lesions with chronic blood products and no definite enhancement are again identified on series 11: Medial right frontal lobe (image 93), left frontal lobe (image 85), medial right parietal lobe (image 110), left posterior temporal lobe (image 60), right occipital lobe (image 72), and left cerebellar hemisphere (image 42). Stable T2 hyperintensity surrounding the posterior left temporal no new mass or abnormal enhancement. No acute infarction. No significant mass effect. No hydrocephalus. Multiple additional foci of susceptibility are again identified likely reflecting chronic microhemorrhages. Vascular: Major vessel flow voids at the skull base are preserved. Skull and upper cervical spine: Normal marrow signal. Sinuses/Orbits: Trace mucosal thickening.  Orbits are unremarkable. Other: Mastoid air cells are clear. IMPRESSION: Stable appearance of treated brain metastases.  No new finding. Electronically Signed   By: PMacy MisM.D.   On: 02/06/2019 14:55    CTainter LakeClinician Interpretation: I have personally reviewed the radiological images as listed.  My interpretation, in the context of the patient's clinical presentation, is stable disease   Assessment/Plan 1. Metastasis to brain ([facility]  Deborah Foster is clinically and radiographically stable today.  No further seizure events.  She should return to clinic with repeat MRI brain in 6 months or sooner if clinically indicated.    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  We appreciate the  opportunity to participate in the care of Deborah Foster.    All questions were answered. The patient knows to call the clinic with any problems, questions or concerns. No barriers to learning were detected.  The total time spent in the encounter was 30 minutes and more than 50% was on counseling and review of test results   ZaVentura SellersMD Medical Director of Neuro-Oncology [facility] [place]1/26/21 9:35 AM

## 2019-02-11 ENCOUNTER — Telehealth: Payer: Self-pay | Admitting: Internal Medicine

## 2019-02-11 NOTE — Telephone Encounter (Signed)
Scheduled appt per 1/26 los.  Sent a message to HIM pool to get a calendar mailed out.

## 2019-02-20 ENCOUNTER — Telehealth: Payer: Self-pay | Admitting: Medical Oncology

## 2019-02-20 NOTE — Telephone Encounter (Signed)
COVID test result today is Positive.-please cancel her appts.and reschedule.

## 2019-02-25 ENCOUNTER — Other Ambulatory Visit: Payer: Medicare Other

## 2019-02-25 ENCOUNTER — Telehealth: Payer: Self-pay | Admitting: Internal Medicine

## 2019-02-25 ENCOUNTER — Ambulatory Visit: Payer: Medicare Other | Admitting: Internal Medicine

## 2019-02-25 ENCOUNTER — Ambulatory Visit: Payer: Medicare Other

## 2019-02-25 NOTE — Telephone Encounter (Signed)
I gave her next appt to the person who answered her phone.

## 2019-02-25 NOTE — Telephone Encounter (Signed)
Scheduled per 2/5 sch msg. Called pt no answer via interpreter and was unable to leave a msg. Mailing printout

## 2019-03-04 MED FILL — XALKORI 250 MG CAPSULE: 250 | 30 days supply | Qty: 60 | Fill #2

## 2019-03-04 MED FILL — TAGRISSO 80 MG TABLET: 80 | 30 days supply | Qty: 30 | Fill #10

## 2019-03-09 NOTE — Progress Notes (Signed)
[place] OFFICE PROGRESS NOTE  [name], MD [place] [id number]  DIAGNOSIS:  1)Stage IV (T2a, N1, M1b) non-small cell lung cancer, adenocarcinoma with positive EGFR mutation (deletion 19) presented with right middle lobe lung mass, right hilar adenopathy as well as metastatic disease to the bone, brain and left adrenal diagnosed in July 2016. 2)deep venous thrombosis of the left lower extremity diagnosed in March 2020  PRIOR THERAPY: 1) palliative radiotherapy to the brain as well as metastatic bone lesions in the pelvis. 2) Tarceva 150 mg by mouth daily started on 09/25/2014. Status post 5 months of treatment. 3) status post palliative radiotherapy to the right middle lobe lung mass for treatment of hemoptysis. 4) Tarceva 100 mg by mouth daily started 02/19/2015 status post 13 months of treatment. This was discontinued in May 2018 after the patient had disease progression and development of T790M resistant mutation.  5) palliative stereotactic radiotherapy to the left adrenal gland metastatic lesion under the care of Dr. Lisbeth Renshaw completed on April 10, 2017.  CURRENT THERAPY: 1) Tagrisso 80 mg by mouth daily started 06/02/2016. Status post 33 months of treatment. 2) Xgeva 120 g subcutaneously every 6 weeks. 3)Xalkori 250 mg p.o. twice daily started 05/08/2018. This was a change it to once daily in July 2020 secondary intolerant intolerance.  INTERVAL HISTORY: Deborah Foster 49 y.o. female returns to the clinic for a follow up visit accompanied by her interpretor. The patient is feeling fair today without any concerning complaints except for occasionally alternating between diarrhea and constipation. He recently had COVID-19 and had a cough. She is feeling much better at this time. She denies any recent fevers, chills, or night sweats. She denies sore throat, cough, or shortness of breath at this time. She lost about 2 lbs since her last  appointment. She states she has decreased appetite since starting Xalkori. She used to take Remeron but ran out. She states it helped her while she was taking it. She is also out of her anti-emetic and is requesting a refill.    She continues to take oral chemotherapy with Tagrisso for her EGFR mutation and Xalkori for MET amplification. Her dose of Xalkori was decreased to once daily due to intolerance. She continues to report stable visual episodes of floaters, particularly in the morning since starting Xalkori. She is also on Keppra for seizures and reports her last brain MRI was stable. She denies nausea or vomiting but notes a persistent/stable discomfort in her RUQ/Right lower rib cage when she takes a deep breath. The pain has been present for almost 1 year and is mild which she may take tylenol for. She is here for evaluation and repeat blood work.    MEDICAL HISTORY: Past Medical History:  Diagnosis Date  . Bone metastasis ([place])   . Hemoptysis   . Hypokalemia   . lung ca dx'd 07/2014  . Lung mass   . Metastasis to adrenal gland ([place])   . Metastasis to brain ([place])   . Pneumonia   . Radiation 08/23/14-09/07/14   Brain/chest and left hip 30 Gy 12 Fx  . URI (upper respiratory infection) 02-02-2015    ALLERGIES:  is allergic to other and doxycycline.  MEDICATIONS:  Current Outpatient Medications  Medication Sig Dispense Refill  . AMBULATORY NON FORMULARY MEDICATION Medication Name: MAGIC MOUTHWASH 2 % Viscous Lidocaine  Maalox Benadryl Susp.  Disp: 1:1:1 282m bottle Sig: 189mPO Swish & Spit  q 3-4hrs. PRN mouth  sores 200 mL 3  . Calcium Carbonate-Vitamin D (CALCIUM 500 + D) 500-125 MG-UNIT TABS Take 500 mg by mouth 2 (two) times daily.    . clindamycin (CLINDAGEL) 1 % gel Apply 1 application topically 2 (two) times daily.    . clobetasol cream (TEMOVATE) 5.10 % Apply 1 application topically 2 (two) times daily.    . Cyanocobalamin (VITAMIN B-12) 2500 MCG SUBL Place 2,500 mcg  under the tongue daily.    Marland Kitchen Dextromethorphan Polistirex (DELSYM PO) Take by mouth.    . ENSURE (ENSURE) Take 1 Can by mouth 2 (two) times daily between meals.     . ferrous sulfate 325 (65 FE) MG EC tablet Take 325 mg by mouth daily with breakfast.    . fluticasone (VERAMYST) 27.5 MCG/SPRAY nasal spray Place 2 sprays into the nose daily.    . folic acid (FOLVITE) 258 MCG tablet Take 1 tablet (800 mcg total) by mouth daily. 30 tablet 1  . levETIRAcetam (KEPPRA) 500 MG tablet TAKE 2 TABLETS (1,000 MG TOTAL) BY MOUTH 2 TIMES DAILY FOR 14 DAYS. 56 tablet 0  . levETIRAcetam (KEPPRA) 500 MG tablet TAKE 2 TABLETS BY MOUTH TWICE DAILY FOR 14 DAYS 56 tablet 5  . loratadine (CLARITIN) 10 MG tablet Take 1 tablet (10 mg total) by mouth daily. 30 tablet 0  . mirtazapine (REMERON) 15 MG tablet Take 1 tablet (15 mg total) by mouth at bedtime. 30 tablet 0  . Nutritional Supplements (JUICE PLUS FIBRE PO) Take 1 capsule by mouth daily. Reported on 07/28/2015    . OVER THE COUNTER MEDICATION "Deep penetrating pain relief oil"    . oxyCODONE-acetaminophen (PERCOCET/ROXICET) 5-325 MG tablet Take 1 tablet by mouth every 6 (six) hours as needed for severe pain. 60 tablet 0  . oxyCODONE-acetaminophen (PERCOCET/ROXICET) 5-325 MG tablet     . prochlorperazine (COMPAZINE) 10 MG tablet TAKE 1 TABLET BY MOUTH EVERY 6 HOURS AS NEEDED FOR NAUSEA OR VOMITING. 30 tablet 0  . ranitidine (ZANTAC) 300 MG tablet Take 1 tablet (300 mg total) by mouth at bedtime. 30 tablet 6  . TAGRISSO 80 MG tablet TAKE 1 TABLET (80 MG TOTAL) BY MOUTH DAILY. 30 tablet 11  . TURMERIC PO Take 1 capsule by mouth 2 (two) times daily. Turmeric Superior 750 mg po bid    . XALKORI 250 MG capsule TAKE 1 CAPSULE (250 MG TOTAL) BY MOUTH 2 TIMES DAILY. 60 capsule 2  . XARELTO 20 MG TABS tablet TAKE 1 TABLET (20 MG TOTAL) BY MOUTH DAILY WITH SUPPER. 30 tablet 1   No current facility-administered medications for this visit.    SURGICAL HISTORY:  Past  Surgical History:  Procedure Laterality Date  . IR GENERIC HISTORICAL  07/14/2015   IR RADIOLOGIST EVAL & MGMT 07/14/2015 Greggory Keen, MD [facility] INTERV RAD  . VIDEO BRONCHOSCOPY Bilateral 08/09/2014   Procedure: VIDEO BRONCHOSCOPY WITHOUT FLUORO;  Surgeon: [name], MD;  Location[facility] ENDOSCOPY;  Service: Cardiopulmonary;  Laterality: Bilateral;  . VIDEO BRONCHOSCOPY Bilateral 07/06/2015   Procedure: VIDEO BRONCHOSCOPY WITHOUT FLUORO;  Surgeon: [name], MD;  Location: [facility] ENDOSCOPY;  Service: Cardiopulmonary;  Laterality: Bilateral;    REVIEW OF SYSTEMS:   Review of Systems  Constitutional: Positive for decreased appetite, 2 lbs weight loss (over 4 weeks), and fatigue. Negative for chills and fever. HENT: Negative for mouth sores, nosebleeds, sore throat and trouble swallowing.   Eyes: Positive for floaters in AM. Negative for icterus.  Respiratory: Negative for cough, hemoptysis, shortness  of breath and wheezing.   Cardiovascular: Negative for chest pain and leg swelling.  Gastrointestinal: Positive for occasional diarrhea/constipation. Negative for abdominal pain, nausea and vomiting.  Genitourinary: Negative for bladder incontinence, difficulty urinating, dysuria, frequency and hematuria.   Musculoskeletal: Negative for back pain, gait problem, neck pain and neck stiffness.  Skin: Negative for itching and rash.  Neurological: Negative for dizziness, extremity weakness, gait problem, headaches, light-headedness and seizures.  Hematological: Negative for adenopathy. Does not bruise/bleed easily.  Psychiatric/Behavioral: Negative for confusion, depression and sleep disturbance. The patient is not nervous/anxious.     PHYSICAL EXAMINATION:  Blood pressure 99/63, pulse (!) 110, temperature 98 F (36.7 C), temperature source Temporal, resp. rate 18, height '4\' 11"'$  (1.499 m), weight 91 lb 11.2 oz (41.6 kg), SpO2 100 %.  ECOG PERFORMANCE STATUS: 1 - Symptomatic but completely  ambulatory  Physical Exam  Constitutional: Oriented to person, place, and time and thin appearing female and in no distress.  HENT:  Head: Normocephalic and atraumatic.  Mouth/Throat: Oropharynx is clear and moist. No oropharyngeal exudate.  Eyes: Conjunctivae are normal. Right eye exhibits no discharge. Left eye exhibits no discharge. No scleral icterus.  Neck: Normal range of motion. Neck supple.  Cardiovascular: Normal rate, regular rhythm, normal heart sounds and intact distal pulses.   Pulmonary/Chest: Effort normal and breath sounds normal. No respiratory distress. No wheezes. No rales.  Abdominal: Soft. Bowel sounds are normal. Exhibits no distension and no mass. There is no tenderness.  Musculoskeletal: Normal range of motion. Exhibits no edema.  Lymphadenopathy:    No cervical adenopathy.  Neurological: Alert and oriented to person, place, and time. Exhibits normal muscle tone. Gait normal. Coordination normal.  Skin: Skin is warm and dry. No rash noted. Not diaphoretic. No erythema. No pallor.  Psychiatric: Mood, memory and judgment normal.  Vitals reviewed.  LABORATORY DATA: Lab Results  Component Value Date   WBC 5.0 03/10/2019   HGB 10.8 (L) 03/10/2019   HCT 33.9 (L) 03/10/2019   MCV 96.0 03/10/2019   PLT 329 03/10/2019      Chemistry      Component Value Date/Time   NA 140 03/10/2019 1009   NA 140 01/17/2017 1003   K 4.5 03/10/2019 1009   K 3.9 01/17/2017 1003   CL 105 03/10/2019 1009   CO2 27 03/10/2019 1009   CO2 29 01/17/2017 1003   BUN 15 03/10/2019 1009   BUN 14.8 01/17/2017 1003   CREATININE 0.71 03/10/2019 1009   CREATININE 0.8 01/17/2017 1003      Component Value Date/Time   CALCIUM 8.1 (L) 03/10/2019 1009   CALCIUM 8.7 01/17/2017 1003   ALKPHOS 84 03/10/2019 1009   ALKPHOS 50 01/17/2017 1003   AST 29 03/10/2019 1009   AST 23 01/17/2017 1003   ALT 30 03/10/2019 1009   ALT 27 01/17/2017 1003   BILITOT 0.2 (L) 03/10/2019 1009   BILITOT 0.46  01/17/2017 1003       RADIOGRAPHIC STUDIES:  No results found.   ASSESSMENT/PLAN:  This is a very pleasant 49 year old Asian female with stage IV non-small cell lung cancer, adenocarcinoma.  She has a positive EGFR mutation with deletion in exon 67.  She was diagnosed in July 2016.  She is status post 18 months of treatment with Tarceva.  This was discontinued secondary to evidence of disease progression with the development of a T790M resistant mutation.  She was then started on treatment with Tagrisso 80 mg by mouth.  Status post 33 months  of treatment.   She was found to have a MET amplification on repeat molecular studies.  She was then started on Xalkori 250 mg p.o. twice daily in addition to Tagrisso 80 mg p.o. daily. Xalkori was reduced to 250 mg once daily secondary to intolerance.   Labs were reviewed. She will continue on the same treatment. She was encouraged to take a calcium supplement.   I will arrange for a restaging CT scan of the chest, abdomen, and pelvis in 6 weeks.  We will see her for a follow up visit in 6 weeks for evaluation and to review her scan results.   I have sent a refill of remeron for her appetite and compazine for nausea to her pharmacy.   She will continue Xarelto for her history of PE.   She will receive Xgeva later this week.   I counseled the patient on diarrhea and constipation management. Discussed that sometimes taking stool softeners/laxatives may precipitate diarrhea as well as imodium contributing to constipation. Discussed management with lifestyle and dietary modifications.   Discussed the COVID vaccine and current vaccination guidelines if you recently had the infection. Discussed recommendation is to wait 90 days prior to receiving vaccine.   The patient was advised to call immediately if she has any concerning symptoms in the interval. The patient voices understanding of current disease status and treatment options and is in  agreement with the current care plan. All questions were answered. The patient knows to call the clinic with any problems, questions or concerns. We can certainly see the patient much sooner if necessary    Orders Placed This Encounter  Procedures  . CT Chest W Contrast    Standing Status:   Future    Standing Expiration Date:   03/09/2020    Order Specific Question:   ** REASON FOR EXAM (FREE TEXT)    Answer:   restaging Lung cancer    Order Specific Question:   If indicated for the ordered procedure, I authorize the administration of contrast media per Radiology protocol    Answer:   Yes    Order Specific Question:   Is patient pregnant?    Answer:   No    Order Specific Question:   Preferred imaging location?    Answer:   [facility]    Order Specific Question:   Radiology Contrast Protocol - do NOT remove file path    Answer:   \\[email or web address]  . CT Abdomen Pelvis W Contrast    Standing Status:   Future    Standing Expiration Date:   03/09/2020    Order Specific Question:   ** REASON FOR EXAM (FREE TEXT)    Answer:   Restaging Lung Cancer    Order Specific Question:   If indicated for the ordered procedure, I authorize the administration of contrast media per Radiology protocol    Answer:   Yes    Order Specific Question:   Is patient pregnant?    Answer:   No    Order Specific Question:   Preferred imaging location?    Answer:   [facility]    Order Specific Question:   Is Oral Contrast requested for this exam?    Answer:   Yes, Per Radiology protocol    Order Specific Question:   Radiology Contrast Protocol - do NOT remove file path    Answer:   \\[email or web address]  . CBC with Differential ([facility] Only)    Standing Status:  Future    Standing Expiration Date:   03/09/2020  . CMP ([facility] only)    Standing Status:   Future    Standing Expiration Date:   03/09/2020     Tobe Sos  Monita Swier, PA-C 03/10/19

## 2019-03-10 ENCOUNTER — Inpatient Hospital Stay: Payer: Medicare Other | Attending: Internal Medicine

## 2019-03-10 ENCOUNTER — Encounter: Payer: Self-pay | Admitting: Physician Assistant

## 2019-03-10 ENCOUNTER — Other Ambulatory Visit: Payer: Self-pay

## 2019-03-10 ENCOUNTER — Inpatient Hospital Stay: Payer: Medicare Other

## 2019-03-10 ENCOUNTER — Telehealth: Payer: Self-pay | Admitting: Internal Medicine

## 2019-03-10 ENCOUNTER — Inpatient Hospital Stay (HOSPITAL_BASED_OUTPATIENT_CLINIC_OR_DEPARTMENT_OTHER): Payer: Medicare Other | Admitting: Physician Assistant

## 2019-03-10 VITALS — BP 99/63 | HR 110 | Temp 98.0°F | Resp 18 | Ht 59.0 in | Wt 91.7 lb

## 2019-03-10 DIAGNOSIS — C342 Malignant neoplasm of middle lobe, bronchus or lung: Secondary | ICD-10-CM | POA: Diagnosis present

## 2019-03-10 DIAGNOSIS — K5900 Constipation, unspecified: Secondary | ICD-10-CM | POA: Diagnosis not present

## 2019-03-10 DIAGNOSIS — Z86718 Personal history of other venous thrombosis and embolism: Secondary | ICD-10-CM | POA: Insufficient documentation

## 2019-03-10 DIAGNOSIS — C7951 Secondary malignant neoplasm of bone: Secondary | ICD-10-CM | POA: Diagnosis not present

## 2019-03-10 DIAGNOSIS — Z79899 Other long term (current) drug therapy: Secondary | ICD-10-CM | POA: Diagnosis not present

## 2019-03-10 DIAGNOSIS — Z7901 Long term (current) use of anticoagulants: Secondary | ICD-10-CM | POA: Insufficient documentation

## 2019-03-10 DIAGNOSIS — R110 Nausea: Secondary | ICD-10-CM | POA: Diagnosis not present

## 2019-03-10 DIAGNOSIS — R197 Diarrhea, unspecified: Secondary | ICD-10-CM | POA: Diagnosis not present

## 2019-03-10 DIAGNOSIS — R634 Abnormal weight loss: Secondary | ICD-10-CM | POA: Diagnosis not present

## 2019-03-10 DIAGNOSIS — C3491 Malignant neoplasm of unspecified part of right bronchus or lung: Secondary | ICD-10-CM

## 2019-03-10 LAB — CMP (CANCER CENTER ONLY)
ALT: 30 U/L (ref 0–44)
AST: 29 U/L (ref 15–41)
Albumin: 3 g/dL — ABNORMAL LOW (ref 3.5–5.0)
Alkaline Phosphatase: 84 U/L (ref 38–126)
Anion gap: 8 (ref 5–15)
BUN: 15 mg/dL (ref 6–20)
CO2: 27 mmol/L (ref 22–32)
Calcium: 8.1 mg/dL — ABNORMAL LOW (ref 8.9–10.3)
Chloride: 105 mmol/L (ref 98–111)
Creatinine: 0.71 mg/dL (ref 0.44–1.00)
GFR, Est AFR Am: >60 mL/min (ref >60)
GFR, Estimated: >60 mL/min (ref >60)
Glucose, Bld: 110 mg/dL — ABNORMAL HIGH (ref 70–99)
Potassium: 4.5 mmol/L (ref 3.5–5.1)
Sodium: 140 mmol/L (ref 135–145)
Total Bilirubin: 0.2 mg/dL — ABNORMAL LOW (ref 0.3–1.2)
Total Protein: 6.4 g/dL — ABNORMAL LOW (ref 6.5–8.1)

## 2019-03-10 LAB — CBC WITH DIFFERENTIAL (CANCER CENTER ONLY)
Abs Immature Granulocytes: 0.02 10*3/uL (ref 0.00–0.07)
Basophils Absolute: 0 10*3/uL (ref 0.0–0.1)
Basophils Relative: 0 %
Eosinophils Absolute: 0.1 10*3/uL (ref 0.0–0.5)
Eosinophils Relative: 2 %
HCT: 33.9 % — ABNORMAL LOW (ref 36.0–46.0)
Hemoglobin: 10.8 g/dL — ABNORMAL LOW (ref 12.0–15.0)
Immature Granulocytes: 0 %
Lymphocytes Relative: 13 %
Lymphs Abs: 0.6 10*3/uL — ABNORMAL LOW (ref 0.7–4.0)
MCH: 30.6 pg (ref 26.0–34.0)
MCHC: 31.9 g/dL (ref 30.0–36.0)
MCV: 96 fL (ref 80.0–100.0)
Monocytes Absolute: 0.6 10*3/uL (ref 0.1–1.0)
Monocytes Relative: 12 %
Neutro Abs: 3.6 10*3/uL (ref 1.7–7.7)
Neutrophils Relative %: 73 %
Platelet Count: 329 10*3/uL (ref 150–400)
RBC: 3.53 MIL/uL — ABNORMAL LOW (ref 3.87–5.11)
RDW: 15.1 % (ref 11.5–15.5)
WBC Count: 5 10*3/uL (ref 4.0–10.5)
nRBC: 0 % (ref 0.0–0.2)

## 2019-03-10 MED ORDER — MIRTAZAPINE 15 MG PO TABS: 15.0000 mg | ORAL_TABLET | Freq: Every day | ORAL | 0 refills | Status: DC

## 2019-03-10 MED ORDER — PROCHLORPERAZINE MALEATE 10 MG PO TABS: ORAL_TABLET | ORAL | 0 refills | Status: DC

## 2019-03-10 MED FILL — MIRTAZAPINE 15 MG TABLET: 15 | 30 days supply | Qty: 30 | Fill #0

## 2019-03-10 MED FILL — PROCHLORPERAZINE 10 MG TAB: 10 | 7 days supply | Qty: 30 | Fill #0

## 2019-03-10 NOTE — Telephone Encounter (Signed)
Scheduled appt per 2/23 sch message- pt aware of appt

## 2019-03-10 NOTE — Progress Notes (Signed)
Patient receiving xgeva 120mg  sq today. Her calcium is 8.1 today, ok to give today per provider. Providing education about calcium replacement. [facility] is currently out of stock and medication will be scheduled to be given later in the week

## 2019-03-11 ENCOUNTER — Telehealth: Payer: Self-pay | Admitting: Internal Medicine

## 2019-03-11 NOTE — Telephone Encounter (Signed)
Scheduled per los. Called and left msg. Mailed printout  °

## 2019-03-12 ENCOUNTER — Other Ambulatory Visit: Payer: Self-pay

## 2019-03-12 ENCOUNTER — Inpatient Hospital Stay: Payer: Medicare Other

## 2019-03-12 VITALS — BP 102/78 | HR 88 | Temp 98.2°F | Resp 18

## 2019-03-12 DIAGNOSIS — C7951 Secondary malignant neoplasm of bone: Secondary | ICD-10-CM

## 2019-03-12 DIAGNOSIS — E889 Metabolic disorder, unspecified: Secondary | ICD-10-CM

## 2019-03-12 DIAGNOSIS — C3431 Malignant neoplasm of lower lobe, right bronchus or lung: Secondary | ICD-10-CM

## 2019-03-12 DIAGNOSIS — C342 Malignant neoplasm of middle lobe, bronchus or lung: Secondary | ICD-10-CM | POA: Diagnosis not present

## 2019-03-12 DIAGNOSIS — C3491 Malignant neoplasm of unspecified part of right bronchus or lung: Secondary | ICD-10-CM

## 2019-03-12 DIAGNOSIS — M898X9 Other specified disorders of bone, unspecified site: Secondary | ICD-10-CM

## 2019-03-12 MED ORDER — DENOSUMAB 120 MG/1.7ML ~~LOC~~ SOLN
120.0000 mg | Freq: Once | SUBCUTANEOUS | Status: AC
  Administered 2019-03-12: 120 mg via SUBCUTANEOUS

## 2019-03-12 MED ORDER — DENOSUMAB 120 MG/1.7ML ~~LOC~~ SOLN
SUBCUTANEOUS | Status: AC
  Filled 2019-03-12: qty 1.7

## 2019-03-12 NOTE — Progress Notes (Signed)
Per Dr Julien Nordmann it is okay to give xgeva today with Calcium of 8.1.

## 2019-03-12 NOTE — Patient Instructions (Signed)
Denosumab injection What is this medicine? DENOSUMAB (den oh sue mab) slows bone breakdown. Prolia is used to treat osteoporosis in women after menopause and in men, and in people who are taking corticosteroids for 6 months or more. Xgeva is used to treat a high calcium level due to cancer and to prevent bone fractures and other bone problems caused by multiple myeloma or cancer bone metastases. Xgeva is also used to treat giant cell tumor of the bone. This medicine may be used for other purposes; ask your health care provider or pharmacist if you have questions. COMMON BRAND NAME(S): Prolia, XGEVA What should I tell my health care provider before I take this medicine? They need to know if you have any of these conditions:  dental disease  having surgery or tooth extraction  infection  kidney disease  low levels of calcium or Vitamin D in the blood  malnutrition  on hemodialysis  skin conditions or sensitivity  thyroid or parathyroid disease  an unusual reaction to denosumab, other medicines, foods, dyes, or preservatives  pregnant or trying to get pregnant  breast-feeding How should I use this medicine? This medicine is for injection under the skin. It is given by a health care professional in a hospital or clinic setting. A special MedGuide will be given to you before each treatment. Be sure to read this information carefully each time. For Prolia, talk to your pediatrician regarding the use of this medicine in children. Special care may be needed. For Xgeva, talk to your pediatrician regarding the use of this medicine in children. While this drug may be prescribed for children as young as 13 years for selected conditions, precautions do apply. Overdosage: If you think you have taken too much of this medicine contact a poison control center or emergency room at once. NOTE: This medicine is only for you. Do not share this medicine with others. What if I miss a dose? It is  important not to miss your dose. Call your doctor or health care professional if you are unable to keep an appointment. What may interact with this medicine? Do not take this medicine with any of the following medications:  other medicines containing denosumab This medicine may also interact with the following medications:  medicines that lower your chance of fighting infection  steroid medicines like prednisone or cortisone This list may not describe all possible interactions. Give your health care provider a list of all the medicines, herbs, non-prescription drugs, or dietary supplements you use. Also tell them if you smoke, drink alcohol, or use illegal drugs. Some items may interact with your medicine. What should I watch for while using this medicine? Visit your doctor or health care professional for regular checks on your progress. Your doctor or health care professional may order blood tests and other tests to see how you are doing. Call your doctor or health care professional for advice if you get a fever, chills or sore throat, or other symptoms of a cold or flu. Do not treat yourself. This drug may decrease your body's ability to fight infection. Try to avoid being around people who are sick. You should make sure you get enough calcium and vitamin D while you are taking this medicine, unless your doctor tells you not to. Discuss the foods you eat and the vitamins you take with your health care professional. See your dentist regularly. Brush and floss your teeth as directed. Before you have any dental work done, tell your dentist you are   receiving this medicine. Do not become pregnant while taking this medicine or for 5 months after stopping it. Talk with your doctor or health care professional about your birth control options while taking this medicine. Women should inform their doctor if they wish to become pregnant or think they might be pregnant. There is a potential for serious side  effects to an unborn child. Talk to your health care professional or pharmacist for more information. What side effects may I notice from receiving this medicine? Side effects that you should report to your doctor or health care professional as soon as possible:  allergic reactions like skin rash, itching or hives, swelling of the face, lips, or tongue  bone pain  breathing problems  dizziness  jaw pain, especially after dental work  redness, blistering, peeling of the skin  signs and symptoms of infection like fever or chills; cough; sore throat; pain or trouble passing urine  signs of low calcium like fast heartbeat, muscle cramps or muscle pain; pain, tingling, numbness in the hands or feet; seizures  unusual bleeding or bruising  unusually weak or tired Side effects that usually do not require medical attention (report to your doctor or health care professional if they continue or are bothersome):  constipation  diarrhea  headache  joint pain  loss of appetite  muscle pain  runny nose  tiredness  upset stomach This list may not describe all possible side effects. Call your doctor for medical advice about side effects. You may report side effects to FDA at 1-800-FDA-1088. Where should I keep my medicine? This medicine is only given in a clinic, doctor's office, or other health care setting and will not be stored at home. NOTE: This sheet is a summary. It may not cover all possible information. If you have questions about this medicine, talk to your doctor, pharmacist, or health care provider.  2020 Elsevier/Gold Standard (2017-05-10 16:10:44)

## 2019-04-02 ENCOUNTER — Other Ambulatory Visit: Payer: Self-pay | Admitting: Internal Medicine

## 2019-04-02 MED FILL — XALKORI 250 MG CAPSULE: 250 | 30 days supply | Qty: 60 | Fill #0

## 2019-04-03 MED FILL — TAGRISSO 80 MG TABLET: 80 | 30 days supply | Qty: 30 | Fill #11

## 2019-04-08 ENCOUNTER — Inpatient Hospital Stay: Payer: Medicare Other

## 2019-04-08 ENCOUNTER — Inpatient Hospital Stay: Payer: Medicare Other | Attending: Internal Medicine

## 2019-04-08 ENCOUNTER — Other Ambulatory Visit: Payer: Self-pay

## 2019-04-08 DIAGNOSIS — C7972 Secondary malignant neoplasm of left adrenal gland: Secondary | ICD-10-CM | POA: Diagnosis not present

## 2019-04-08 DIAGNOSIS — C7951 Secondary malignant neoplasm of bone: Secondary | ICD-10-CM | POA: Diagnosis not present

## 2019-04-08 DIAGNOSIS — E889 Metabolic disorder, unspecified: Secondary | ICD-10-CM

## 2019-04-08 DIAGNOSIS — C3431 Malignant neoplasm of lower lobe, right bronchus or lung: Secondary | ICD-10-CM | POA: Insufficient documentation

## 2019-04-08 DIAGNOSIS — C3491 Malignant neoplasm of unspecified part of right bronchus or lung: Secondary | ICD-10-CM

## 2019-04-08 DIAGNOSIS — C772 Secondary and unspecified malignant neoplasm of intra-abdominal lymph nodes: Secondary | ICD-10-CM | POA: Diagnosis not present

## 2019-04-08 DIAGNOSIS — M898X9 Other specified disorders of bone, unspecified site: Secondary | ICD-10-CM

## 2019-04-08 LAB — CMP (CANCER CENTER ONLY)
ALT: 29 U/L (ref 0–44)
AST: 28 U/L (ref 15–41)
Albumin: 2.9 g/dL — ABNORMAL LOW (ref 3.5–5.0)
Alkaline Phosphatase: 93 U/L (ref 38–126)
Anion gap: 5 (ref 5–15)
BUN: 14 mg/dL (ref 6–20)
CO2: 28 mmol/L (ref 22–32)
Calcium: 8.3 mg/dL — ABNORMAL LOW (ref 8.9–10.3)
Chloride: 105 mmol/L (ref 98–111)
Creatinine: 0.71 mg/dL (ref 0.44–1.00)
GFR, Est AFR Am: >60 mL/min (ref >60)
GFR, Estimated: >60 mL/min (ref >60)
Glucose, Bld: 84 mg/dL (ref 70–99)
Potassium: 4.4 mmol/L (ref 3.5–5.1)
Sodium: 138 mmol/L (ref 135–145)
Total Bilirubin: 0.2 mg/dL — ABNORMAL LOW (ref 0.3–1.2)
Total Protein: 6.5 g/dL (ref 6.5–8.1)

## 2019-04-08 LAB — CBC WITH DIFFERENTIAL (CANCER CENTER ONLY)
Abs Immature Granulocytes: 0.02 10*3/uL (ref 0.00–0.07)
Basophils Absolute: 0 10*3/uL (ref 0.0–0.1)
Basophils Relative: 0 %
Eosinophils Absolute: 0.1 10*3/uL (ref 0.0–0.5)
Eosinophils Relative: 2 %
HCT: 33.9 % — ABNORMAL LOW (ref 36.0–46.0)
Hemoglobin: 11 g/dL — ABNORMAL LOW (ref 12.0–15.0)
Immature Granulocytes: 0 %
Lymphocytes Relative: 14 %
Lymphs Abs: 0.7 10*3/uL (ref 0.7–4.0)
MCH: 30.8 pg (ref 26.0–34.0)
MCHC: 32.4 g/dL (ref 30.0–36.0)
MCV: 95 fL (ref 80.0–100.0)
Monocytes Absolute: 0.6 10*3/uL (ref 0.1–1.0)
Monocytes Relative: 13 %
Neutro Abs: 3.4 10*3/uL (ref 1.7–7.7)
Neutrophils Relative %: 71 %
Platelet Count: 327 10*3/uL (ref 150–400)
RBC: 3.57 MIL/uL — ABNORMAL LOW (ref 3.87–5.11)
RDW: 15.1 % (ref 11.5–15.5)
WBC Count: 4.9 10*3/uL (ref 4.0–10.5)
nRBC: 0 % (ref 0.0–0.2)

## 2019-04-08 MED ORDER — DENOSUMAB 120 MG/1.7ML ~~LOC~~ SOLN
120.0000 mg | Freq: Once | SUBCUTANEOUS | Status: AC
  Administered 2019-04-08: 120 mg via SUBCUTANEOUS

## 2019-04-08 NOTE — Patient Instructions (Signed)
Denosumab injection What is this medicine? DENOSUMAB (den oh sue mab) slows bone breakdown. Prolia is used to treat osteoporosis in women after menopause and in men, and in people who are taking corticosteroids for 6 months or more. Xgeva is used to treat a high calcium level due to cancer and to prevent bone fractures and other bone problems caused by multiple myeloma or cancer bone metastases. Xgeva is also used to treat giant cell tumor of the bone. This medicine may be used for other purposes; ask your health care provider or pharmacist if you have questions. COMMON BRAND NAME(S): Prolia, XGEVA What should I tell my health care provider before I take this medicine? They need to know if you have any of these conditions:  dental disease  having surgery or tooth extraction  infection  kidney disease  low levels of calcium or Vitamin D in the blood  malnutrition  on hemodialysis  skin conditions or sensitivity  thyroid or parathyroid disease  an unusual reaction to denosumab, other medicines, foods, dyes, or preservatives  pregnant or trying to get pregnant  breast-feeding How should I use this medicine? This medicine is for injection under the skin. It is given by a health care professional in a hospital or clinic setting. A special MedGuide will be given to you before each treatment. Be sure to read this information carefully each time. For Prolia, talk to your pediatrician regarding the use of this medicine in children. Special care may be needed. For Xgeva, talk to your pediatrician regarding the use of this medicine in children. While this drug may be prescribed for children as young as 13 years for selected conditions, precautions do apply. Overdosage: If you think you have taken too much of this medicine contact a poison control center or emergency room at once. NOTE: This medicine is only for you. Do not share this medicine with others. What if I miss a dose? It is  important not to miss your dose. Call your doctor or health care professional if you are unable to keep an appointment. What may interact with this medicine? Do not take this medicine with any of the following medications:  other medicines containing denosumab This medicine may also interact with the following medications:  medicines that lower your chance of fighting infection  steroid medicines like prednisone or cortisone This list may not describe all possible interactions. Give your health care provider a list of all the medicines, herbs, non-prescription drugs, or dietary supplements you use. Also tell them if you smoke, drink alcohol, or use illegal drugs. Some items may interact with your medicine. What should I watch for while using this medicine? Visit your doctor or health care professional for regular checks on your progress. Your doctor or health care professional may order blood tests and other tests to see how you are doing. Call your doctor or health care professional for advice if you get a fever, chills or sore throat, or other symptoms of a cold or flu. Do not treat yourself. This drug may decrease your body's ability to fight infection. Try to avoid being around people who are sick. You should make sure you get enough calcium and vitamin D while you are taking this medicine, unless your doctor tells you not to. Discuss the foods you eat and the vitamins you take with your health care professional. See your dentist regularly. Brush and floss your teeth as directed. Before you have any dental work done, tell your dentist you are   receiving this medicine. Do not become pregnant while taking this medicine or for 5 months after stopping it. Talk with your doctor or health care professional about your birth control options while taking this medicine. Women should inform their doctor if they wish to become pregnant or think they might be pregnant. There is a potential for serious side  effects to an unborn child. Talk to your health care professional or pharmacist for more information. What side effects may I notice from receiving this medicine? Side effects that you should report to your doctor or health care professional as soon as possible:  allergic reactions like skin rash, itching or hives, swelling of the face, lips, or tongue  bone pain  breathing problems  dizziness  jaw pain, especially after dental work  redness, blistering, peeling of the skin  signs and symptoms of infection like fever or chills; cough; sore throat; pain or trouble passing urine  signs of low calcium like fast heartbeat, muscle cramps or muscle pain; pain, tingling, numbness in the hands or feet; seizures  unusual bleeding or bruising  unusually weak or tired Side effects that usually do not require medical attention (report to your doctor or health care professional if they continue or are bothersome):  constipation  diarrhea  headache  joint pain  loss of appetite  muscle pain  runny nose  tiredness  upset stomach This list may not describe all possible side effects. Call your doctor for medical advice about side effects. You may report side effects to FDA at 1-800-FDA-1088. Where should I keep my medicine? This medicine is only given in a clinic, doctor's office, or other health care setting and will not be stored at home. NOTE: This sheet is a summary. It may not cover all possible information. If you have questions about this medicine, talk to your doctor, pharmacist, or health care provider.  2020 Elsevier/Gold Standard (2017-05-10 16:10:44)

## 2019-04-14 ENCOUNTER — Telehealth: Payer: Self-pay | Admitting: *Deleted

## 2019-04-14 NOTE — Telephone Encounter (Signed)
Patient called questioning if she should be taking the keppra.  Explained per last Office Visit note she should continue to take Keppra 250 mg BID.  No further questions.

## 2019-04-16 ENCOUNTER — Encounter (HOSPITAL_COMMUNITY): Payer: Self-pay

## 2019-04-16 ENCOUNTER — Ambulatory Visit (HOSPITAL_COMMUNITY)
Admission: RE | Admit: 2019-04-16 | Discharge: 2019-04-16 | Disposition: A | Discharge status: Home / Self Care | Payer: Medicare Other | Source: Ambulatory Visit | Attending: Physician Assistant | Admitting: Physician Assistant

## 2019-04-16 ENCOUNTER — Other Ambulatory Visit: Payer: Self-pay

## 2019-04-16 ENCOUNTER — Inpatient Hospital Stay: Payer: Medicare Other | Attending: Internal Medicine

## 2019-04-16 DIAGNOSIS — R112 Nausea with vomiting, unspecified: Secondary | ICD-10-CM | POA: Insufficient documentation

## 2019-04-16 DIAGNOSIS — Z7901 Long term (current) use of anticoagulants: Secondary | ICD-10-CM | POA: Insufficient documentation

## 2019-04-16 DIAGNOSIS — Z86718 Personal history of other venous thrombosis and embolism: Secondary | ICD-10-CM | POA: Insufficient documentation

## 2019-04-16 DIAGNOSIS — R531 Weakness: Secondary | ICD-10-CM | POA: Insufficient documentation

## 2019-04-16 DIAGNOSIS — C7951 Secondary malignant neoplasm of bone: Secondary | ICD-10-CM | POA: Insufficient documentation

## 2019-04-16 DIAGNOSIS — R5383 Other fatigue: Secondary | ICD-10-CM | POA: Insufficient documentation

## 2019-04-16 DIAGNOSIS — C3491 Malignant neoplasm of unspecified part of right bronchus or lung: Secondary | ICD-10-CM | POA: Insufficient documentation

## 2019-04-16 DIAGNOSIS — C342 Malignant neoplasm of middle lobe, bronchus or lung: Secondary | ICD-10-CM | POA: Insufficient documentation

## 2019-04-16 DIAGNOSIS — C7971 Secondary malignant neoplasm of right adrenal gland: Secondary | ICD-10-CM | POA: Insufficient documentation

## 2019-04-16 DIAGNOSIS — Z79899 Other long term (current) drug therapy: Secondary | ICD-10-CM | POA: Insufficient documentation

## 2019-04-16 MED ORDER — IOHEXOL 300 MG/ML  SOLN
100.0000 mL | Freq: Once | INTRAMUSCULAR | Status: AC | PRN
  Administered 2019-04-16: 10:00:00 100 mL via INTRAVENOUS

## 2019-04-16 MED ORDER — SODIUM CHLORIDE (PF) 0.9 % IJ SOLN
INTRAMUSCULAR | Status: AC
  Filled 2019-04-16: qty 50

## 2019-04-20 ENCOUNTER — Other Ambulatory Visit: Payer: Self-pay

## 2019-04-20 ENCOUNTER — Inpatient Hospital Stay (HOSPITAL_BASED_OUTPATIENT_CLINIC_OR_DEPARTMENT_OTHER): Payer: Medicare Other | Admitting: Internal Medicine

## 2019-04-20 ENCOUNTER — Encounter: Payer: Self-pay | Admitting: Internal Medicine

## 2019-04-20 VITALS — BP 113/62 | HR 104 | Temp 97.8°F | Resp 17 | Ht 59.0 in | Wt 91.7 lb

## 2019-04-20 DIAGNOSIS — R531 Weakness: Secondary | ICD-10-CM | POA: Diagnosis not present

## 2019-04-20 DIAGNOSIS — Z86718 Personal history of other venous thrombosis and embolism: Secondary | ICD-10-CM | POA: Diagnosis not present

## 2019-04-20 DIAGNOSIS — C7972 Secondary malignant neoplasm of left adrenal gland: Secondary | ICD-10-CM

## 2019-04-20 DIAGNOSIS — C772 Secondary and unspecified malignant neoplasm of intra-abdominal lymph nodes: Secondary | ICD-10-CM

## 2019-04-20 DIAGNOSIS — C3431 Malignant neoplasm of lower lobe, right bronchus or lung: Secondary | ICD-10-CM

## 2019-04-20 DIAGNOSIS — C7971 Secondary malignant neoplasm of right adrenal gland: Secondary | ICD-10-CM | POA: Diagnosis not present

## 2019-04-20 DIAGNOSIS — C3491 Malignant neoplasm of unspecified part of right bronchus or lung: Secondary | ICD-10-CM

## 2019-04-20 DIAGNOSIS — Z79899 Other long term (current) drug therapy: Secondary | ICD-10-CM | POA: Diagnosis not present

## 2019-04-20 DIAGNOSIS — Z5111 Encounter for antineoplastic chemotherapy: Secondary | ICD-10-CM

## 2019-04-20 DIAGNOSIS — C342 Malignant neoplasm of middle lobe, bronchus or lung: Secondary | ICD-10-CM | POA: Diagnosis present

## 2019-04-20 DIAGNOSIS — Z7901 Long term (current) use of anticoagulants: Secondary | ICD-10-CM | POA: Diagnosis not present

## 2019-04-20 DIAGNOSIS — C7951 Secondary malignant neoplasm of bone: Secondary | ICD-10-CM | POA: Diagnosis not present

## 2019-04-20 DIAGNOSIS — R5383 Other fatigue: Secondary | ICD-10-CM | POA: Diagnosis not present

## 2019-04-20 DIAGNOSIS — R112 Nausea with vomiting, unspecified: Secondary | ICD-10-CM | POA: Diagnosis not present

## 2019-04-20 NOTE — Progress Notes (Signed)
[place] [phone]) [phone]   [phone]  OFFICE PROGRESS NOTE  [name], MD [place] [address]  DIAGNOSIS:  1) Stage IV (T2a, N1, M1b) non-small cell lung cancer, adenocarcinoma with positive EGFR mutation (deletion 74) presented with right middle lobe lung mass, right hilar adenopathy as well as metastatic disease to the bone, brain and left adrenal diagnosed in July 2016. 2) deep venous thrombosis of the left lower extremity diagnosed in March 2020  PRIOR THERAPY: 1) palliative radiotherapy to the brain as well as metastatic bone lesions in the pelvis. 2) Tarceva 150 mg by mouth daily started on 09/25/2014. Status post 5 months of treatment. 3) status post palliative radiotherapy to the right middle lobe lung mass for treatment of hemoptysis. 4) Tarceva 100 mg by mouth daily started 02/19/2015 status post 13 months of treatment. This was discontinued in May 2018 after the patient had disease progression and development of T790M resistant mutation.  5) palliative stereotactic radiotherapy to the left adrenal gland metastatic lesion under the care of Dr. Lisbeth Renshaw completed on April 10, 2017.  CURRENT THERAPY: 1) Tagrisso 80 mg by mouth daily started 06/02/2016.  Status post 35 months of treatment. 2) Xgeva 120 g subcutaneously every 6 weeks. 3) Xalkori 250 mg p.o. twice daily started 05/08/2018.  This was a change it to once daily in July 2020 secondary intolerant intolerance.  INTERVAL HISTORY: Deborah Foster 49 y.o. female returns to the clinic today for follow-up visit.  The patient is feeling fine today with no concerning complaints except for left flank pain.  She also has occasional nausea.  She denied having any current chest pain, shortness of breath but has mild cough with no hemoptysis.  She denied having any fever or chills.  She has occasional nausea and vomiting but no diarrhea or constipation.  She denied  having any headache or visual changes.  She has no significant weight loss or night sweats.  The patient has been tolerating her treatment with Tagrisso and Xalkori fairly well.  She had repeat CT scan of the chest, abdomen pelvis performed recently and she is here for evaluation and discussion of her risk her results.   MEDICAL HISTORY: Past Medical History:  Diagnosis Date  . Bone metastasis ([place])   . Hemoptysis   . Hypokalemia   . lung ca dx'd 07/2014  . Lung mass   . Metastasis to adrenal gland ([place])   . Metastasis to brain ([place])   . Pneumonia   . Radiation 08/23/14-09/07/14   Brain/chest and left hip 30 Gy 12 Fx  . URI (upper respiratory infection) 02-02-2015    ALLERGIES:  is allergic to other and doxycycline.  MEDICATIONS:  Current Outpatient Medications  Medication Sig Dispense Refill  . AMBULATORY NON FORMULARY MEDICATION Medication Name: MAGIC MOUTHWASH 2 % Viscous Lidocaine  Maalox Benadryl Susp.  Disp: 1:1:1 272m bottle Sig: 179mPO Swish & Spit  q 3-4hrs. PRN mouth sores 200 mL 3  . Calcium Carbonate-Vitamin D (CALCIUM 500 + D) 500-125 MG-UNIT TABS Take 500 mg by mouth 2 (two) times daily.    . clindamycin (CLINDAGEL) 1 % gel Apply 1 application topically 2 (two) times daily.    . clobetasol cream (TEMOVATE) 0.6.14 Apply 1 application topically 2 (two) times daily.    . Cyanocobalamin (VITAMIN B-12) 2500 MCG SUBL Place 2,500 mcg under the tongue daily.    . Marland Kitchenextromethorphan Polistirex (DELSYM PO) Take by  mouth.    . ENSURE (ENSURE) Take 1 Can by mouth 2 (two) times daily between meals.     . ferrous sulfate 325 (65 FE) MG EC tablet Take 325 mg by mouth daily with breakfast.    . fluticasone (VERAMYST) 27.5 MCG/SPRAY nasal spray Place 2 sprays into the nose daily.    . folic acid (FOLVITE) 119 MCG tablet Take 1 tablet (800 mcg total) by mouth daily. 30 tablet 1  . levETIRAcetam (KEPPRA) 500 MG tablet TAKE 2 TABLETS (1,000 MG TOTAL) BY MOUTH 2 TIMES DAILY FOR 14 DAYS.  56 tablet 0  . levETIRAcetam (KEPPRA) 500 MG tablet TAKE 2 TABLETS BY MOUTH TWICE DAILY FOR 14 DAYS 56 tablet 5  . loratadine (CLARITIN) 10 MG tablet Take 1 tablet (10 mg total) by mouth daily. 30 tablet 0  . mirtazapine (REMERON) 15 MG tablet Take 1 tablet (15 mg total) by mouth at bedtime. 30 tablet 0  . Nutritional Supplements (JUICE PLUS FIBRE PO) Take 1 capsule by mouth daily. Reported on 07/28/2015    . OVER THE COUNTER MEDICATION "Deep penetrating pain relief oil"    . oxyCODONE-acetaminophen (PERCOCET/ROXICET) 5-325 MG tablet Take 1 tablet by mouth every 6 (six) hours as needed for severe pain. 60 tablet 0  . oxyCODONE-acetaminophen (PERCOCET/ROXICET) 5-325 MG tablet     . prochlorperazine (COMPAZINE) 10 MG tablet TAKE 1 TABLET BY MOUTH EVERY 6 HOURS AS NEEDED FOR NAUSEA OR VOMITING. 30 tablet 0  . ranitidine (ZANTAC) 300 MG tablet Take 1 tablet (300 mg total) by mouth at bedtime. 30 tablet 6  . TAGRISSO 80 MG tablet TAKE 1 TABLET (80 MG TOTAL) BY MOUTH DAILY. 30 tablet 11  . TURMERIC PO Take 1 capsule by mouth 2 (two) times daily. Turmeric Superior 750 mg po bid    . XALKORI 250 MG capsule TAKE 1 CAPSULE (250 MG TOTAL) BY MOUTH 2 TIMES DAILY. 60 capsule 2  . XARELTO 20 MG TABS tablet TAKE 1 TABLET (20 MG TOTAL) BY MOUTH DAILY WITH SUPPER. 30 tablet 1   No current facility-administered medications for this visit.    SURGICAL HISTORY:  Past Surgical History:  Procedure Laterality Date  . IR GENERIC HISTORICAL  07/14/2015   IR RADIOLOGIST EVAL & MGMT 07/14/2015 Greggory Keen, MD [facility] INTERV RAD  . VIDEO BRONCHOSCOPY Bilateral 08/09/2014   Procedure: VIDEO BRONCHOSCOPY WITHOUT FLUORO;  Surgeon: [name], MD;  Location: [facility] ENDOSCOPY;  Service: Cardiopulmonary;  Laterality: Bilateral;  . VIDEO BRONCHOSCOPY Bilateral 07/06/2015   Procedure: VIDEO BRONCHOSCOPY WITHOUT FLUORO;  Surgeon: [name], MD;  Location: [facility] ENDOSCOPY;  Service: Cardiopulmonary;  Laterality: Bilateral;      REVIEW OF SYSTEMS:  Constitutional: positive for fatigue Eyes: negative Ears, nose, mouth, throat, and face: negative Respiratory: negative Cardiovascular: negative Gastrointestinal: positive for nausea Genitourinary:negative Integument/breast: negative Hematologic/lymphatic: negative Musculoskeletal:positive for back pain Neurological: negative Behavioral/Psych: negative Endocrine: negative Allergic/Immunologic: negative   PHYSICAL EXAMINATION: General appearance: alert, cooperative, fatigued and no distress Head: Normocephalic, without obvious abnormality, atraumatic Neck: no adenopathy, no JVD, supple, symmetrical, trachea midline and thyroid not enlarged, symmetric, no tenderness/mass/nodules Lymph nodes: Cervical, supraclavicular, and axillary nodes normal. Resp: clear to auscultation bilaterally Back: symmetric, no curvature. ROM normal. No CVA tenderness. Cardio: regular rate and rhythm, S1, S2 normal, no murmur, click, rub or gallop GI: soft, non-tender; bowel sounds normal; no masses,  no organomegaly Extremities: extremities normal, atraumatic, no cyanosis or edema Neurologic: Alert and oriented X 3, normal strength and tone. Normal  symmetric reflexes. Normal coordination and gait  ECOG PERFORMANCE STATUS: 1 - Symptomatic but completely ambulatory  Blood pressure 113/62, pulse (!) 104, temperature 97.8 F (36.6 C), temperature source Temporal, resp. rate 17, height '4\' 11"'$  (1.499 m), weight 91 lb 11.2 oz (41.6 kg), SpO2 100 %.  LABORATORY DATA: Lab Results  Component Value Date   WBC 4.9 04/08/2019   HGB 11.0 (L) 04/08/2019   HCT 33.9 (L) 04/08/2019   MCV 95.0 04/08/2019   PLT 327 04/08/2019      Chemistry      Component Value Date/Time   NA 138 04/08/2019 0922   NA 140 01/17/2017 1003   K 4.4 04/08/2019 0922   K 3.9 01/17/2017 1003   CL 105 04/08/2019 0922   CO2 28 04/08/2019 0922   CO2 29 01/17/2017 1003   BUN 14 04/08/2019 0922   BUN 14.8  01/17/2017 1003   CREATININE 0.71 04/08/2019 0922   CREATININE 0.8 01/17/2017 1003      Component Value Date/Time   CALCIUM 8.3 (L) 04/08/2019 0922   CALCIUM 8.7 01/17/2017 1003   ALKPHOS 93 04/08/2019 0922   ALKPHOS 50 01/17/2017 1003   AST 28 04/08/2019 0922   AST 23 01/17/2017 1003   ALT 29 04/08/2019 0922   ALT 27 01/17/2017 1003   BILITOT 0.2 (L) 04/08/2019 0922   BILITOT 0.46 01/17/2017 1003       RADIOGRAPHIC STUDIES: CT Chest W Contrast  Result Date: 04/16/2019 CLINICAL DATA:  Right middle lobe lung cancer restaging, metastatic EXAM: CT CHEST, ABDOMEN, AND PELVIS WITH CONTRAST TECHNIQUE: Multidetector CT imaging of the chest, abdomen and pelvis was performed following the standard protocol during bolus administration of intravenous contrast. CONTRAST:  134m OMNIPAQUE IOHEXOL 300 MG/ML SOLN, additional oral enteric contrast COMPARISON:  11/19/2018 FINDINGS: CT CHEST FINDINGS Cardiovascular: No significant vascular findings. Normal heart size. No pericardial effusion. Mediastinum/Nodes: No discretely enlarged mediastinal, hilar, or axillary lymph nodes. Thyroid gland, trachea, and esophagus demonstrate no significant findings. Lungs/Pleura: Significant interval enlargement of previously noted new pulmonary nodules in the right upper lobe, the largest now measuring 9 mm, previously 4 mm (series 6, image 41). There is additional, unchanged infectious or inflammatory nodularity of the dependent right upper lobe (series 6, image 51). No change in dense post treatment consolidation, fibrosis, and volume loss of the perihilar right lung. Trace right pleural effusion. Musculoskeletal: No chest wall mass. Unchanged sclerotic osseous lesions, including of the manubrium and right clavicle. CT ABDOMEN PELVIS FINDINGS Hepatobiliary: No solid liver abnormality is seen. No gallstones or gallbladder wall thickening. Unchanged intra- and extrahepatic biliary ductal dilatation to the ampulla. Pancreas:  Unremarkable. No pancreatic ductal dilatation or surrounding inflammatory changes. Spleen: Normal in size without significant abnormality. Adrenals/Urinary Tract: Enlarged left, new right adrenal masses, measuring 3.1 x 2.0 cm on the left and 2.8 x 1.9 cm on the right (series 2, image 50, 51). Kidneys are normal, without renal calculi, solid lesion, or hydronephrosis. Bladder is unremarkable. Stomach/Bowel: Stomach is within normal limits. Appendix appears normal. No evidence of bowel wall thickening, distention, or inflammatory changes. Vascular/Lymphatic: No significant vascular findings are present. No enlarged abdominal or pelvic lymph nodes. Reproductive: No mass or other abnormality. Other: No abdominal wall hernia or abnormality. No abdominopelvic ascites. Musculoskeletal: Unchanged sclerotic osseous lesions, including of the L1 vertebral body, left ilium and left femoral neck. IMPRESSION: 1. Significant interval enlargement of previously noted new pulmonary nodules in the right upper lobe, the largest now measuring 9 mm, previously 4  mm, consistent with metastatic disease. 2.  Unchanged post treatment appearance of the perihilar right lung. 3. Enlarged left, new right adrenal masses, measuring 3.1 cm on the left and 2.8 cm on the right, consistent with metastatic disease. 4.  Unchanged sclerotic osseous metastatic disease. 5. Redemonstrated intra- and extrahepatic biliary ductal dilatation, of uncertain significance. Electronically Signed   By: Eddie Candle M.D.   On: 04/16/2019 09:53   CT Abdomen Pelvis W Contrast  Result Date: 04/16/2019 CLINICAL DATA:  Right middle lobe lung cancer restaging, metastatic EXAM: CT CHEST, ABDOMEN, AND PELVIS WITH CONTRAST TECHNIQUE: Multidetector CT imaging of the chest, abdomen and pelvis was performed following the standard protocol during bolus administration of intravenous contrast. CONTRAST:  136m OMNIPAQUE IOHEXOL 300 MG/ML SOLN, additional oral enteric contrast  COMPARISON:  11/19/2018 FINDINGS: CT CHEST FINDINGS Cardiovascular: No significant vascular findings. Normal heart size. No pericardial effusion. Mediastinum/Nodes: No discretely enlarged mediastinal, hilar, or axillary lymph nodes. Thyroid gland, trachea, and esophagus demonstrate no significant findings. Lungs/Pleura: Significant interval enlargement of previously noted new pulmonary nodules in the right upper lobe, the largest now measuring 9 mm, previously 4 mm (series 6, image 41). There is additional, unchanged infectious or inflammatory nodularity of the dependent right upper lobe (series 6, image 51). No change in dense post treatment consolidation, fibrosis, and volume loss of the perihilar right lung. Trace right pleural effusion. Musculoskeletal: No chest wall mass. Unchanged sclerotic osseous lesions, including of the manubrium and right clavicle. CT ABDOMEN PELVIS FINDINGS Hepatobiliary: No solid liver abnormality is seen. No gallstones or gallbladder wall thickening. Unchanged intra- and extrahepatic biliary ductal dilatation to the ampulla. Pancreas: Unremarkable. No pancreatic ductal dilatation or surrounding inflammatory changes. Spleen: Normal in size without significant abnormality. Adrenals/Urinary Tract: Enlarged left, new right adrenal masses, measuring 3.1 x 2.0 cm on the left and 2.8 x 1.9 cm on the right (series 2, image 50, 51). Kidneys are normal, without renal calculi, solid lesion, or hydronephrosis. Bladder is unremarkable. Stomach/Bowel: Stomach is within normal limits. Appendix appears normal. No evidence of bowel wall thickening, distention, or inflammatory changes. Vascular/Lymphatic: No significant vascular findings are present. No enlarged abdominal or pelvic lymph nodes. Reproductive: No mass or other abnormality. Other: No abdominal wall hernia or abnormality. No abdominopelvic ascites. Musculoskeletal: Unchanged sclerotic osseous lesions, including of the L1 vertebral body,  left ilium and left femoral neck. IMPRESSION: 1. Significant interval enlargement of previously noted new pulmonary nodules in the right upper lobe, the largest now measuring 9 mm, previously 4 mm, consistent with metastatic disease. 2.  Unchanged post treatment appearance of the perihilar right lung. 3. Enlarged left, new right adrenal masses, measuring 3.1 cm on the left and 2.8 cm on the right, consistent with metastatic disease. 4.  Unchanged sclerotic osseous metastatic disease. 5. Redemonstrated intra- and extrahepatic biliary ductal dilatation, of uncertain significance. Electronically Signed   By: AEddie CandleM.D.   On: 04/16/2019 09:53    ASSESSMENT AND PLAN:  This is a very pleasant 49years old Asian female with stage IV non-small cell lung cancer, adenocarcinoma with positive EGFR mutation with deletion in exon 153diagnosed in July 2016 status post treatment with Tarceva for a total of 18 months but this was discontinued secondary to disease progression and development of T790M resistant mutation. The patient was started on treatment with Tagrisso 80 mg by mouth daily status post 35 months of treatment. She was also found to have MET amplification on repeat molecular studies. In addition to [place]  the patient was a started on Xalkori 250 mg p.o. twice daily.  She has some side effect of this treatment with increasing fatigue and weakness as well as nausea and vomiting.  Hulda Humphrey was a change it to 250 once daily at nighttime  The patient continues to tolerate this treatment well with no concerning adverse effects except for fatigue. She had repeat CT scan of the chest, abdomen pelvis performed recently.  I personally and independently reviewed the scans and discussed the results with the patient today. Her scan showed stable disease except for the enlarging right upper lobe pulmonary nodule as well as increase in the left adrenal and new right adrenal metastasis. I recommended for the  patient to continue her current treatment with Tagrisso and Xalkori with the same dose. I will refer the patient to radiation oncology, Dr. Lisbeth Renshaw for consideration of palliative radiotherapy to the area of progressive disease for now. For the bone metastasis, she will continue her treatment with Xgeva every 6 weeks. For the deep venous thrombosis the patient is currently on treatment with Xarelto. I will see her back for follow-up visit in 1 months for evaluation and repeat blood work. She was advised to call immediately if she has any concerning symptoms in the interval.  The patient voices understanding of current disease status and treatment options and is in agreement with the current care plan. All questions were answered. The patient knows to call the clinic with any problems, questions or concerns. We can certainly see the patient much sooner if necessary.  Disclaimer: This note was dictated with voice recognition software. Similar sounding words can inadvertently be transcribed and may not be corrected upon review.

## 2019-04-22 ENCOUNTER — Telehealth: Payer: Self-pay | Admitting: Internal Medicine

## 2019-04-22 NOTE — Telephone Encounter (Signed)
Scheduled per los. Called with interpreter, left msg. Mailed printout  °

## 2019-04-28 ENCOUNTER — Other Ambulatory Visit: Payer: Self-pay | Admitting: Internal Medicine

## 2019-04-28 DIAGNOSIS — C3491 Malignant neoplasm of unspecified part of right bronchus or lung: Secondary | ICD-10-CM

## 2019-04-28 DIAGNOSIS — C7951 Secondary malignant neoplasm of bone: Secondary | ICD-10-CM

## 2019-04-28 DIAGNOSIS — Z5111 Encounter for antineoplastic chemotherapy: Secondary | ICD-10-CM

## 2019-04-28 DIAGNOSIS — C7972 Secondary malignant neoplasm of left adrenal gland: Secondary | ICD-10-CM

## 2019-04-28 DIAGNOSIS — C7931 Secondary malignant neoplasm of brain: Secondary | ICD-10-CM

## 2019-04-30 ENCOUNTER — Ambulatory Visit: Payer: Medicare Other

## 2019-04-30 ENCOUNTER — Ambulatory Visit: Payer: Medicare Other | Admitting: Radiation Oncology

## 2019-04-30 ENCOUNTER — Encounter: Payer: Self-pay | Admitting: Radiation Oncology

## 2019-04-30 ENCOUNTER — Other Ambulatory Visit: Payer: Self-pay

## 2019-04-30 ENCOUNTER — Ambulatory Visit
Admission: RE | Admit: 2019-04-30 | Discharge: 2019-04-30 | Disposition: A | Discharge status: Home / Self Care | Payer: Medicare Other | Source: Ambulatory Visit | Attending: Radiation Oncology | Admitting: Radiation Oncology

## 2019-04-30 DIAGNOSIS — C7801 Secondary malignant neoplasm of right lung: Secondary | ICD-10-CM

## 2019-04-30 DIAGNOSIS — C3491 Malignant neoplasm of unspecified part of right bronchus or lung: Secondary | ICD-10-CM

## 2019-04-30 DIAGNOSIS — C7972 Secondary malignant neoplasm of left adrenal gland: Secondary | ICD-10-CM

## 2019-04-30 DIAGNOSIS — C7800 Secondary malignant neoplasm of unspecified lung: Secondary | ICD-10-CM | POA: Insufficient documentation

## 2019-04-30 DIAGNOSIS — C7931 Secondary malignant neoplasm of brain: Secondary | ICD-10-CM

## 2019-04-30 DIAGNOSIS — C7971 Secondary malignant neoplasm of right adrenal gland: Secondary | ICD-10-CM

## 2019-04-30 NOTE — Addendum Note (Signed)
Encounter addended by: Hayden Pedro, PA-C on: 04/30/2019 10:03 AM  Actions taken: Visit diagnoses modified

## 2019-04-30 NOTE — Progress Notes (Signed)
Radiation Oncology         [phone] ________________________________  Outpatient ReConsultation - Conducted via telephone due to current COVID-19 concerns for limiting patient exposure  I spoke with the patient to conduct this consult visit via telephone to spare the patient unnecessary potential exposure in the healthcare setting during the current COVID-19 pandemic. The patient was notified in advance and was offered a [place] meeting to allow for face to face communication but unfortunately reported that they did not have the appropriate resources/technology to support such a visit and instead preferred to proceed with a telephone visit.  Name: Deborah Foster MRN: [id number]  Date of Service: 04/30/2019 DOB: 09-27-1970   CC: [name], MD  [name], MD  Diagnosis:  Stage IV T2a, N1, M1b NSCLC, adenocarcinoma of the right middle lobe with metastatic disease to the retroperitoneal and left supraclavicular nodes.  Interval Since Last Radiation: 1 year  04/16/2018-04/29/2018: The left supraclavicular nodes and retroperitoneal adenopathy was treated to 30 GY in 10 fractions  04/01/2017 - 04/10/2017: Left Adrenal Gland / 40 Gy in 5 fractions  07/21/2015 to 08/03/2015:  The Right hilar tumor was treated to 30 Gy in 10 fractions at 3 Gy per fraction.   08/23/14-09/07/14:  30 Gy in 12 fractions was prescribed to the right lung, whole brain, and left hip.   Narrative:    Deborah Foster is a pleasant 49 y.o. woman with stage IV, adenocarcinoma with EGFR mutation of the right  Middle lobe,  which was identified in the summer of 2016. She received whole brain and right lung radiotherapy along the left hip treatment in August 2016. She's been followed in surveillance since, she recurred locally in the chest, and received radiotherapy in the summer of 2017.  She was found to have progressive disease in her left adrenal gland on CT on 03/08/17,  measuring 19 x 18 mm, previously 12 x 10.5  mm. She completed SBRT at that time had been on [place]. CT on 03/18/2018 to have new disease in the left supraclavicular node chain measuring up to 6 mm, and a right paratracheal node measuring 11 mm. Her retroperitoneal disease was 3.2 x 3.1 cm and encases the left renal artery, and a new left retroperitoneal node was also noted measuring 10 mm, along with a new 10 mm aortocaval node.  She underwent an ultrasound core biopsy of there left supraclavicular node on 03/31/2018 and this revealed metastatic adenocarcinoma consistent with her history of lung cancer. She received palliative radiotherapy to the retroperitoneal and left supraclavicular regions about a year ago. She has remained NED in the brain and follows with Dr. Mickeal Skinner. She continues on Tagrisso and was found to have a MET amplification and started [place] along with Tagrisso this year. She had recent CT scan for restaging on 04/16/19 revealing progression of a right upper lobe nodule measuring 9 mm, previously 4 mm, and progression of her left adrenal gland measuring 3.1 x 2 cm, and new right adrenal metastasis measuring 2.8 x 1.9 cm. She is contacted by phone today to discuss radiotherapy options to these three sites.   On review of systems, the patient reports that she is doing well overall. She does have some sputum production with rare episodes of blood tinged mucous. She denies any chest pain, shortness of breath, fevers, chills, night sweats, unintended weight changes. She denies any bowel or bladder disturbances, and denies abdominal pain, nausea or vomiting. She denies any new musculoskeletal  or joint aches or pains, new skin lesions or concerns. A complete review of systems is obtained and is otherwise negative.   Past Medical History:  Past Medical History:  Diagnosis Date  . Bone metastasis ([place])   . Hemoptysis   . Hypokalemia   . lung ca dx'd 07/2014  . Lung mass   . Metastasis to adrenal gland ([place])   . Metastasis to  brain ([place])   . Pneumonia   . Radiation 08/23/14-09/07/14   Brain/chest and left hip 30 Gy 12 Fx  . URI (upper respiratory infection) 02-02-2015    Past Surgical History: Past Surgical History:  Procedure Laterality Date  . IR GENERIC HISTORICAL  07/14/2015   IR RADIOLOGIST EVAL & MGMT 07/14/2015 Greggory Keen, MD [facility] INTERV RAD  . VIDEO BRONCHOSCOPY Bilateral 08/09/2014   Procedure: VIDEO BRONCHOSCOPY WITHOUT FLUORO;  Surgeon: [name], MD;  Location: [facility] ENDOSCOPY;  Service: Cardiopulmonary;  Laterality: Bilateral;  . VIDEO BRONCHOSCOPY Bilateral 07/06/2015   Procedure: VIDEO BRONCHOSCOPY WITHOUT FLUORO;  Surgeon: [name], MD;  Location: [facility] ENDOSCOPY;  Service: Cardiopulmonary;  Laterality: Bilateral;    Social History:  Social History   Socioeconomic History  . Marital status: Married    Spouse name: Not on file  . Number of children: 3  . Years of education: Not on file  . Highest education level: Not on file  Occupational History  . Occupation: unemployed  Tobacco Use  . Smoking status: Never Smoker  . Smokeless tobacco: Never Used  Substance and Sexual Activity  . Alcohol use: No    Alcohol/week: 0.0 standard drinks  . Drug use: No  . Sexual activity: Not Currently  Other Topics Concern  . Not on file  Social History Narrative   Patient moved to [place] in [place].   Had been in [place] since that time.   Social Determinants of Health   Financial Resource Strain:   . Difficulty of Paying Living Expenses:   Food Insecurity:   . Worried About Charity fundraiser in the Last Year:   . Arboriculturist in the Last Year:   Transportation Needs:   . Film/video editor (Medical):   Marland Kitchen Lack of Transportation (Non-Medical):   Physical Activity:   . Days of Exercise per Week:   . Minutes of Exercise per Session:   Stress:   . Feeling of Stress :   Social Connections:   . Frequency of Communication with Friends and Family:   . Frequency  of Social Gatherings with Friends and Family:   . Attends Religious Services:   . Active Member of Clubs or Organizations:   . Attends Archivist Meetings:   Marland Kitchen Marital Status:   Intimate Partner Violence:   . Fear of Current or Ex-Partner:   . Emotionally Abused:   Marland Kitchen Physically Abused:   . 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 She's originally from [place]. She has three sons, in their teen/preteenage years.  Family History: Family History  Problem Relation Age of Onset  . Hyperlipidemia Mother      ALLERGIES:  is allergic to other and doxycycline.  Meds: Current Outpatient Medications  Medication Sig Dispense Refill  . AMBULATORY NON FORMULARY MEDICATION Medication Name: MAGIC MOUTHWASH 2 % Viscous Lidocaine  Maalox Benadryl Susp.  Disp: 1:1:1 216m bottle Sig: 151mPO Swish & Spit  q 3-4hrs. PRN mouth sores 200 mL 3  . Calcium Carbonate-Vitamin D (  CALCIUM 500 + D) 500-125 MG-UNIT TABS Take 500 mg by mouth 2 (two) times daily.    . clobetasol cream (TEMOVATE) 8.25 % Apply 1 application topically 2 (two) times daily.    . Cyanocobalamin (VITAMIN B-12) 2500 MCG SUBL Place 2,500 mcg under the tongue daily.    Marland Kitchen Dextromethorphan Polistirex (DELSYM PO) Take by mouth.    . ENSURE (ENSURE) Take 1 Can by mouth 2 (two) times daily between meals.     . ferrous sulfate 325 (65 FE) MG EC tablet Take 325 mg by mouth daily with breakfast.    . fluticasone (VERAMYST) 27.5 MCG/SPRAY nasal spray Place 2 sprays into the nose daily.    . folic acid (FOLVITE) 053 MCG tablet Take 1 tablet (800 mcg total) by mouth daily. 30 tablet 1  . levETIRAcetam (KEPPRA) 500 MG tablet TAKE 2 TABLETS BY MOUTH TWICE DAILY FOR 14 DAYS 56 tablet 5  . loratadine (CLARITIN) 10 MG tablet Take 1 tablet (10 mg total) by mouth daily. 30 tablet 0  . mirtazapine (REMERON) 15 MG tablet Take 1 tablet (15 mg total) by mouth at bedtime. 30 tablet 0  . Nutritional Supplements (JUICE PLUS  FIBRE PO) Take 1 capsule by mouth daily. Reported on 07/28/2015    . OVER THE COUNTER MEDICATION "Deep penetrating pain relief oil"    . oxyCODONE-acetaminophen (PERCOCET/ROXICET) 5-325 MG tablet Take 1 tablet by mouth every 6 (six) hours as needed for severe pain. 60 tablet 0  . oxyCODONE-acetaminophen (PERCOCET/ROXICET) 5-325 MG tablet     . prochlorperazine (COMPAZINE) 10 MG tablet TAKE 1 TABLET BY MOUTH EVERY 6 HOURS AS NEEDED FOR NAUSEA OR VOMITING. 30 tablet 0  . ranitidine (ZANTAC) 300 MG tablet Take 1 tablet (300 mg total) by mouth at bedtime. 30 tablet 6  . TAGRISSO 80 MG tablet TAKE 1 TABLET (80 MG TOTAL) BY MOUTH DAILY. 30 tablet 11  . TURMERIC PO Take 1 capsule by mouth 2 (two) times daily. Turmeric Superior 750 mg po bid    . XALKORI 250 MG capsule TAKE 1 CAPSULE (250 MG TOTAL) BY MOUTH 2 TIMES DAILY. 60 capsule 2  . XARELTO 20 MG TABS tablet TAKE 1 TABLET (20 MG TOTAL) BY MOUTH DAILY WITH SUPPER. 30 tablet 1   No current facility-administered medications for this encounter.    Physical Findings: Unable to assess due to encounter.  Lab Findings: Lab Results  Component Value Date   WBC 4.9 04/08/2019   HGB 11.0 (L) 04/08/2019   HCT 33.9 (L) 04/08/2019   MCV 95.0 04/08/2019   PLT 327 04/08/2019     Radiographic Findings: CT Chest W Contrast  Result Date: 04/16/2019 CLINICAL DATA:  Right middle lobe lung cancer restaging, metastatic EXAM: CT CHEST, ABDOMEN, AND PELVIS WITH CONTRAST TECHNIQUE: Multidetector CT imaging of the chest, abdomen and pelvis was performed following the standard protocol during bolus administration of intravenous contrast. CONTRAST:  142m OMNIPAQUE IOHEXOL 300 MG/ML SOLN, additional oral enteric contrast COMPARISON:  11/19/2018 FINDINGS: CT CHEST FINDINGS Cardiovascular: No significant vascular findings. Normal heart size. No pericardial effusion. Mediastinum/Nodes: No discretely enlarged mediastinal, hilar, or axillary lymph nodes. Thyroid gland,  trachea, and esophagus demonstrate no significant findings. Lungs/Pleura: Significant interval enlargement of previously noted new pulmonary nodules in the right upper lobe, the largest now measuring 9 mm, previously 4 mm (series 6, image 41). There is additional, unchanged infectious or inflammatory nodularity of the dependent right upper lobe (series 6, image 51). No change in dense post treatment  consolidation, fibrosis, and volume loss of the perihilar right lung. Trace right pleural effusion. Musculoskeletal: No chest wall mass. Unchanged sclerotic osseous lesions, including of the manubrium and right clavicle. CT ABDOMEN PELVIS FINDINGS Hepatobiliary: No solid liver abnormality is seen. No gallstones or gallbladder wall thickening. Unchanged intra- and extrahepatic biliary ductal dilatation to the ampulla. Pancreas: Unremarkable. No pancreatic ductal dilatation or surrounding inflammatory changes. Spleen: Normal in size without significant abnormality. Adrenals/Urinary Tract: Enlarged left, new right adrenal masses, measuring 3.1 x 2.0 cm on the left and 2.8 x 1.9 cm on the right (series 2, image 50, 51). Kidneys are normal, without renal calculi, solid lesion, or hydronephrosis. Bladder is unremarkable. Stomach/Bowel: Stomach is within normal limits. Appendix appears normal. No evidence of bowel wall thickening, distention, or inflammatory changes. Vascular/Lymphatic: No significant vascular findings are present. No enlarged abdominal or pelvic lymph nodes. Reproductive: No mass or other abnormality. Other: No abdominal wall hernia or abnormality. No abdominopelvic ascites. Musculoskeletal: Unchanged sclerotic osseous lesions, including of the L1 vertebral body, left ilium and left femoral neck. IMPRESSION: 1. Significant interval enlargement of previously noted new pulmonary nodules in the right upper lobe, the largest now measuring 9 mm, previously 4 mm, consistent with metastatic disease. 2.  Unchanged  post treatment appearance of the perihilar right lung. 3. Enlarged left, new right adrenal masses, measuring 3.1 cm on the left and 2.8 cm on the right, consistent with metastatic disease. 4.  Unchanged sclerotic osseous metastatic disease. 5. Redemonstrated intra- and extrahepatic biliary ductal dilatation, of uncertain significance. Electronically Signed   By: Eddie Candle M.D.   On: 04/16/2019 09:53   CT Abdomen Pelvis W Contrast  Result Date: 04/16/2019 CLINICAL DATA:  Right middle lobe lung cancer restaging, metastatic EXAM: CT CHEST, ABDOMEN, AND PELVIS WITH CONTRAST TECHNIQUE: Multidetector CT imaging of the chest, abdomen and pelvis was performed following the standard protocol during bolus administration of intravenous contrast. CONTRAST:  13m OMNIPAQUE IOHEXOL 300 MG/ML SOLN, additional oral enteric contrast COMPARISON:  11/19/2018 FINDINGS: CT CHEST FINDINGS Cardiovascular: No significant vascular findings. Normal heart size. No pericardial effusion. Mediastinum/Nodes: No discretely enlarged mediastinal, hilar, or axillary lymph nodes. Thyroid gland, trachea, and esophagus demonstrate no significant findings. Lungs/Pleura: Significant interval enlargement of previously noted new pulmonary nodules in the right upper lobe, the largest now measuring 9 mm, previously 4 mm (series 6, image 41). There is additional, unchanged infectious or inflammatory nodularity of the dependent right upper lobe (series 6, image 51). No change in dense post treatment consolidation, fibrosis, and volume loss of the perihilar right lung. Trace right pleural effusion. Musculoskeletal: No chest wall mass. Unchanged sclerotic osseous lesions, including of the manubrium and right clavicle. CT ABDOMEN PELVIS FINDINGS Hepatobiliary: No solid liver abnormality is seen. No gallstones or gallbladder wall thickening. Unchanged intra- and extrahepatic biliary ductal dilatation to the ampulla. Pancreas: Unremarkable. No pancreatic  ductal dilatation or surrounding inflammatory changes. Spleen: Normal in size without significant abnormality. Adrenals/Urinary Tract: Enlarged left, new right adrenal masses, measuring 3.1 x 2.0 cm on the left and 2.8 x 1.9 cm on the right (series 2, image 50, 51). Kidneys are normal, without renal calculi, solid lesion, or hydronephrosis. Bladder is unremarkable. Stomach/Bowel: Stomach is within normal limits. Appendix appears normal. No evidence of bowel wall thickening, distention, or inflammatory changes. Vascular/Lymphatic: No significant vascular findings are present. No enlarged abdominal or pelvic lymph nodes. Reproductive: No mass or other abnormality. Other: No abdominal wall hernia or abnormality. No abdominopelvic ascites. Musculoskeletal: Unchanged sclerotic osseous  lesions, including of the L1 vertebral body, left ilium and left femoral neck. IMPRESSION: 1. Significant interval enlargement of previously noted new pulmonary nodules in the right upper lobe, the largest now measuring 9 mm, previously 4 mm, consistent with metastatic disease. 2.  Unchanged post treatment appearance of the perihilar right lung. 3. Enlarged left, new right adrenal masses, measuring 3.1 cm on the left and 2.8 cm on the right, consistent with metastatic disease. 4.  Unchanged sclerotic osseous metastatic disease. 5. Redemonstrated intra- and extrahepatic biliary ductal dilatation, of uncertain significance. Electronically Signed   By: Eddie Candle M.D.   On: 04/16/2019 09:53    Impression/Plan: 1. Stage IV T2a, N1, M1b NSCLC, adenocarcinoma, positive EGFR mutation with deletion in exon 19, of the right middle lobe progressive left adrenal, new right adrenal, and new RUL disease. Dr. Lisbeth Renshaw discusses the pathology findings and reviews the nature of progressive disease, and he would offer stereotactic body radiotherapy (SBRT) to each of the three sites. We discussed the risks, benefits, short, and long term effects of  radiotherapy, and the patient is interested in proceeding. Dr. Lisbeth Renshaw discusses the delivery and logistics of radiotherapy and anticipates a course of 3-5 fractions to the RUL target, and between 5-10 to the adrenal glands depending on simulation.Marland Kitchen She will sign consent next Tuesday when she comes for simulation. 2. Contraceptive counseling. The patient has been menopausal for 5 years so we will not require Hcg testing.  Given current concerns for patient exposure during the COVID-19 pandemic, this encounter was conducted via telephone.  The patient has provided two factor identification and has given verbal consent for this type of encounter and has been advised to only accept a meeting of this type in a secure network environment. The time spent during this encounter was 45 minutes including preparation, discussion, and coordination of the patient's care. The attendants for this meeting include Dr. Lisbeth Renshaw, Hayden Pedro  and Jennefer Bravo and pacific interpretor who assisted in our call..  During the encounter, Dr. Lisbeth Renshaw, and Hayden Pedro were located at [facility] Radiation Oncology Department.  Deborah Foster was located at home and the pacific interpretor was located at her place of employment.    The above documentation reflects my direct findings during this shared patient visit. Please see the separate note by Dr. Lisbeth Renshaw on this date for the remainder of the patient's plan of care.     Carola Rhine, PAC

## 2019-05-04 ENCOUNTER — Ambulatory Visit: Payer: Medicare Other | Admitting: Radiation Oncology

## 2019-05-04 MED FILL — XALKORI 250 MG CAPSULE: 250 | 30 days supply | Qty: 60 | Fill #1

## 2019-05-04 MED FILL — TAGRISSO 80 MG TABLET: 80 | 30 days supply | Qty: 30 | Fill #0

## 2019-05-05 ENCOUNTER — Other Ambulatory Visit: Payer: Self-pay

## 2019-05-05 ENCOUNTER — Ambulatory Visit
Admission: RE | Admit: 2019-05-05 | Discharge: 2019-05-05 | Disposition: A | Discharge status: Home / Self Care | Payer: Medicare Other | Source: Ambulatory Visit | Attending: Radiation Oncology | Admitting: Radiation Oncology

## 2019-05-05 DIAGNOSIS — C7971 Secondary malignant neoplasm of right adrenal gland: Secondary | ICD-10-CM | POA: Diagnosis not present

## 2019-05-05 DIAGNOSIS — C3431 Malignant neoplasm of lower lobe, right bronchus or lung: Secondary | ICD-10-CM | POA: Insufficient documentation

## 2019-05-05 DIAGNOSIS — C7972 Secondary malignant neoplasm of left adrenal gland: Secondary | ICD-10-CM | POA: Insufficient documentation

## 2019-05-11 ENCOUNTER — Other Ambulatory Visit: Payer: Self-pay | Admitting: Medical Oncology

## 2019-05-11 DIAGNOSIS — I82442 Acute embolism and thrombosis of left tibial vein: Secondary | ICD-10-CM

## 2019-05-11 MED ORDER — RIVAROXABAN 20 MG PO TABS: ORAL_TABLET | ORAL | 1 refills | Status: DC

## 2019-05-11 MED FILL — XARELTO 20 MG TABLET: 20 | 30 days supply | Qty: 30 | Fill #0

## 2019-05-11 NOTE — Telephone Encounter (Signed)
refill requested

## 2019-05-19 ENCOUNTER — Inpatient Hospital Stay: Payer: Medicare Other | Attending: Internal Medicine | Admitting: Internal Medicine

## 2019-05-19 ENCOUNTER — Encounter: Payer: Self-pay | Admitting: Internal Medicine

## 2019-05-19 ENCOUNTER — Inpatient Hospital Stay: Payer: Medicare Other

## 2019-05-19 ENCOUNTER — Telehealth: Payer: Self-pay | Admitting: Internal Medicine

## 2019-05-19 ENCOUNTER — Ambulatory Visit
Admission: RE | Admit: 2019-05-19 | Discharge: 2019-05-19 | Disposition: A | Discharge status: Home / Self Care | Payer: Medicare Other | Source: Ambulatory Visit | Attending: Radiation Oncology | Admitting: Radiation Oncology

## 2019-05-19 ENCOUNTER — Other Ambulatory Visit: Payer: Self-pay

## 2019-05-19 VITALS — BP 99/56 | HR 100 | Temp 98.2°F | Resp 20 | Ht 59.0 in | Wt 92.8 lb

## 2019-05-19 DIAGNOSIS — M898X9 Other specified disorders of bone, unspecified site: Secondary | ICD-10-CM

## 2019-05-19 DIAGNOSIS — C7970 Secondary malignant neoplasm of unspecified adrenal gland: Secondary | ICD-10-CM | POA: Insufficient documentation

## 2019-05-19 DIAGNOSIS — R531 Weakness: Secondary | ICD-10-CM | POA: Diagnosis not present

## 2019-05-19 DIAGNOSIS — Z923 Personal history of irradiation: Secondary | ICD-10-CM | POA: Insufficient documentation

## 2019-05-19 DIAGNOSIS — C3490 Malignant neoplasm of unspecified part of unspecified bronchus or lung: Secondary | ICD-10-CM | POA: Diagnosis not present

## 2019-05-19 DIAGNOSIS — D72819 Decreased white blood cell count, unspecified: Secondary | ICD-10-CM | POA: Insufficient documentation

## 2019-05-19 DIAGNOSIS — C3431 Malignant neoplasm of lower lobe, right bronchus or lung: Secondary | ICD-10-CM | POA: Diagnosis not present

## 2019-05-19 DIAGNOSIS — C7951 Secondary malignant neoplasm of bone: Secondary | ICD-10-CM | POA: Insufficient documentation

## 2019-05-19 DIAGNOSIS — C7971 Secondary malignant neoplasm of right adrenal gland: Secondary | ICD-10-CM | POA: Diagnosis not present

## 2019-05-19 DIAGNOSIS — R5383 Other fatigue: Secondary | ICD-10-CM | POA: Diagnosis not present

## 2019-05-19 DIAGNOSIS — Z79899 Other long term (current) drug therapy: Secondary | ICD-10-CM | POA: Insufficient documentation

## 2019-05-19 DIAGNOSIS — Z86718 Personal history of other venous thrombosis and embolism: Secondary | ICD-10-CM | POA: Diagnosis not present

## 2019-05-19 DIAGNOSIS — C7972 Secondary malignant neoplasm of left adrenal gland: Secondary | ICD-10-CM | POA: Diagnosis not present

## 2019-05-19 DIAGNOSIS — E889 Metabolic disorder, unspecified: Secondary | ICD-10-CM

## 2019-05-19 DIAGNOSIS — Z5111 Encounter for antineoplastic chemotherapy: Secondary | ICD-10-CM

## 2019-05-19 DIAGNOSIS — C3491 Malignant neoplasm of unspecified part of right bronchus or lung: Secondary | ICD-10-CM

## 2019-05-19 DIAGNOSIS — C342 Malignant neoplasm of middle lobe, bronchus or lung: Secondary | ICD-10-CM | POA: Insufficient documentation

## 2019-05-19 DIAGNOSIS — M9080 Osteopathy in diseases classified elsewhere, unspecified site: Secondary | ICD-10-CM

## 2019-05-19 LAB — CMP (CANCER CENTER ONLY)
ALT: 30 U/L (ref 0–44)
AST: 30 U/L (ref 15–41)
Albumin: 2.9 g/dL — ABNORMAL LOW (ref 3.5–5.0)
Alkaline Phosphatase: 89 U/L (ref 38–126)
Anion gap: 7 (ref 5–15)
BUN: 12 mg/dL (ref 6–20)
CO2: 26 mmol/L (ref 22–32)
Calcium: 8 mg/dL — ABNORMAL LOW (ref 8.9–10.3)
Chloride: 107 mmol/L (ref 98–111)
Creatinine: 0.7 mg/dL (ref 0.44–1.00)
GFR, Est AFR Am: >60 mL/min (ref >60)
GFR, Estimated: >60 mL/min (ref >60)
Glucose, Bld: 100 mg/dL — ABNORMAL HIGH (ref 70–99)
Potassium: 3.9 mmol/L (ref 3.5–5.1)
Sodium: 140 mmol/L (ref 135–145)
Total Bilirubin: 0.2 mg/dL — ABNORMAL LOW (ref 0.3–1.2)
Total Protein: 6.6 g/dL (ref 6.5–8.1)

## 2019-05-19 LAB — CBC WITH DIFFERENTIAL (CANCER CENTER ONLY)
Abs Immature Granulocytes: 0.01 10*3/uL (ref 0.00–0.07)
Basophils Absolute: 0 10*3/uL (ref 0.0–0.1)
Basophils Relative: 0 %
Eosinophils Absolute: 0.1 10*3/uL (ref 0.0–0.5)
Eosinophils Relative: 2 %
HCT: 35.4 % — ABNORMAL LOW (ref 36.0–46.0)
Hemoglobin: 11.2 g/dL — ABNORMAL LOW (ref 12.0–15.0)
Immature Granulocytes: 0 %
Lymphocytes Relative: 16 %
Lymphs Abs: 0.5 10*3/uL — ABNORMAL LOW (ref 0.7–4.0)
MCH: 29.4 pg (ref 26.0–34.0)
MCHC: 31.6 g/dL (ref 30.0–36.0)
MCV: 92.9 fL (ref 80.0–100.0)
Monocytes Absolute: 0.5 10*3/uL (ref 0.1–1.0)
Monocytes Relative: 14 %
Neutro Abs: 2.2 10*3/uL (ref 1.7–7.7)
Neutrophils Relative %: 68 %
Platelet Count: 311 10*3/uL (ref 150–400)
RBC: 3.81 MIL/uL — ABNORMAL LOW (ref 3.87–5.11)
RDW: 14.8 % (ref 11.5–15.5)
WBC Count: 3.3 10*3/uL — ABNORMAL LOW (ref 4.0–10.5)
nRBC: 0 % (ref 0.0–0.2)

## 2019-05-19 MED ORDER — DENOSUMAB 120 MG/1.7ML ~~LOC~~ SOLN
120.0000 mg | Freq: Once | SUBCUTANEOUS | Status: AC
  Administered 2019-05-19: 120 mg via SUBCUTANEOUS

## 2019-05-19 MED ORDER — DENOSUMAB 120 MG/1.7ML ~~LOC~~ SOLN
SUBCUTANEOUS | Status: AC
  Filled 2019-05-19: qty 1.7

## 2019-05-19 NOTE — Progress Notes (Signed)
Per Dr. Julien Nordmann it is okay to give xgeva today and calcium of 8

## 2019-05-19 NOTE — Telephone Encounter (Signed)
Scheduled per los. Gave avs and calendar  

## 2019-05-19 NOTE — Patient Instructions (Signed)
Denosumab injection What is this medicine? DENOSUMAB (den oh sue mab) slows bone breakdown. Prolia is used to treat osteoporosis in women after menopause and in men, and in people who are taking corticosteroids for 6 months or more. Xgeva is used to treat a high calcium level due to cancer and to prevent bone fractures and other bone problems caused by multiple myeloma or cancer bone metastases. Xgeva is also used to treat giant cell tumor of the bone. This medicine may be used for other purposes; ask your health care provider or pharmacist if you have questions. COMMON BRAND NAME(S): Prolia, XGEVA What should I tell my health care provider before I take this medicine? They need to know if you have any of these conditions:  dental disease  having surgery or tooth extraction  infection  kidney disease  low levels of calcium or Vitamin D in the blood  malnutrition  on hemodialysis  skin conditions or sensitivity  thyroid or parathyroid disease  an unusual reaction to denosumab, other medicines, foods, dyes, or preservatives  pregnant or trying to get pregnant  breast-feeding How should I use this medicine? This medicine is for injection under the skin. It is given by a health care professional in a hospital or clinic setting. A special MedGuide will be given to you before each treatment. Be sure to read this information carefully each time. For Prolia, talk to your pediatrician regarding the use of this medicine in children. Special care may be needed. For Xgeva, talk to your pediatrician regarding the use of this medicine in children. While this drug may be prescribed for children as young as 13 years for selected conditions, precautions do apply. Overdosage: If you think you have taken too much of this medicine contact a poison control center or emergency room at once. NOTE: This medicine is only for you. Do not share this medicine with others. What if I miss a dose? It is  important not to miss your dose. Call your doctor or health care professional if you are unable to keep an appointment. What may interact with this medicine? Do not take this medicine with any of the following medications:  other medicines containing denosumab This medicine may also interact with the following medications:  medicines that lower your chance of fighting infection  steroid medicines like prednisone or cortisone This list may not describe all possible interactions. Give your health care provider a list of all the medicines, herbs, non-prescription drugs, or dietary supplements you use. Also tell them if you smoke, drink alcohol, or use illegal drugs. Some items may interact with your medicine. What should I watch for while using this medicine? Visit your doctor or health care professional for regular checks on your progress. Your doctor or health care professional may order blood tests and other tests to see how you are doing. Call your doctor or health care professional for advice if you get a fever, chills or sore throat, or other symptoms of a cold or flu. Do not treat yourself. This drug may decrease your body's ability to fight infection. Try to avoid being around people who are sick. You should make sure you get enough calcium and vitamin D while you are taking this medicine, unless your doctor tells you not to. Discuss the foods you eat and the vitamins you take with your health care professional. See your dentist regularly. Brush and floss your teeth as directed. Before you have any dental work done, tell your dentist you are   receiving this medicine. Do not become pregnant while taking this medicine or for 5 months after stopping it. Talk with your doctor or health care professional about your birth control options while taking this medicine. Women should inform their doctor if they wish to become pregnant or think they might be pregnant. There is a potential for serious side  effects to an unborn child. Talk to your health care professional or pharmacist for more information. What side effects may I notice from receiving this medicine? Side effects that you should report to your doctor or health care professional as soon as possible:  allergic reactions like skin rash, itching or hives, swelling of the face, lips, or tongue  bone pain  breathing problems  dizziness  jaw pain, especially after dental work  redness, blistering, peeling of the skin  signs and symptoms of infection like fever or chills; cough; sore throat; pain or trouble passing urine  signs of low calcium like fast heartbeat, muscle cramps or muscle pain; pain, tingling, numbness in the hands or feet; seizures  unusual bleeding or bruising  unusually weak or tired Side effects that usually do not require medical attention (report to your doctor or health care professional if they continue or are bothersome):  constipation  diarrhea  headache  joint pain  loss of appetite  muscle pain  runny nose  tiredness  upset stomach This list may not describe all possible side effects. Call your doctor for medical advice about side effects. You may report side effects to FDA at 1-800-FDA-1088. Where should I keep my medicine? This medicine is only given in a clinic, doctor's office, or other health care setting and will not be stored at home. NOTE: This sheet is a summary. It may not cover all possible information. If you have questions about this medicine, talk to your doctor, pharmacist, or health care provider.  2020 Elsevier/Gold Standard (2017-05-10 16:10:44)

## 2019-05-19 NOTE — Progress Notes (Signed)
[place] [phone]   [phone]  OFFICE PROGRESS NOTE  [name], MD [place] [address]  DIAGNOSIS:  1) Stage IV (T2a, N1, M1b) non-small cell lung cancer, adenocarcinoma with positive EGFR mutation (deletion 67) presented with right middle lobe lung mass, right hilar adenopathy as well as metastatic disease to the bone, brain and left adrenal diagnosed in July 2016. 2) deep venous thrombosis of the left lower extremity diagnosed in March 2020  PRIOR THERAPY: 1) palliative radiotherapy to the brain as well as metastatic bone lesions in the pelvis. 2) Tarceva 150 mg by mouth daily started on 09/25/2014. Status post 5 months of treatment. 3) status post palliative radiotherapy to the right middle lobe lung mass for treatment of hemoptysis. 4) Tarceva 100 mg by mouth daily started 02/19/2015 status post 13 months of treatment. This was discontinued in May 2018 after the patient had disease progression and development of T790M resistant mutation.  5) palliative stereotactic radiotherapy to the left adrenal gland metastatic lesion under the care of Dr. Lisbeth Renshaw completed on April 10, 2017.  CURRENT THERAPY: 1) Tagrisso 80 mg by mouth daily started 06/02/2016.  Status post 35 months of treatment. 2) Xgeva 120 g subcutaneously every 6 weeks. 3) Xalkori 250 mg p.o. twice daily started 05/08/2018.  This was a change it to once daily in July 2020 secondary intolerant intolerance.  INTERVAL HISTORY: Deborah Foster 49 y.o. female returns to the clinic today for follow-up visit.  The patient is feeling fine today with no concerning complaints except for fatigue and numbness in her right arm.  She denied having any current chest pain, shortness of breath, cough or hemoptysis.  She has no nausea, vomiting, diarrhea but has intermittent constipation.  She does not use any stool softener.  She denied having any headache or visual  changes.  She is scheduled to start palliative radiotherapy to the adrenal glands under the care of Dr. Lisbeth Renshaw soon.  The patient is here today for evaluation and repeat blood work.  MEDICAL HISTORY: Past Medical History:  Diagnosis Date  . Bone metastasis ([place])   . Hemoptysis   . Hypokalemia   . lung ca dx'd 07/2014  . Lung mass   . Metastasis to adrenal gland ([place])   . Metastasis to brain ([place])   . Pneumonia   . Radiation 08/23/14-09/07/14   Brain/chest and left hip 30 Gy 12 Fx  . URI (upper respiratory infection) 02-02-2015    ALLERGIES:  is allergic to other and doxycycline.  MEDICATIONS:  Current Outpatient Medications  Medication Sig Dispense Refill  . AMBULATORY NON FORMULARY MEDICATION Medication Name: MAGIC MOUTHWASH 2 % Viscous Lidocaine  Maalox Benadryl Susp.  Disp: 1:1:1 270m bottle Sig: 120mPO Swish & Spit  q 3-4hrs. PRN mouth sores 200 mL 3  . Calcium Carbonate-Vitamin D (CALCIUM 500 + D) 500-125 MG-UNIT TABS Take 500 mg by mouth 2 (two) times daily.    . clobetasol cream (TEMOVATE) 0.3.53 Apply 1 application topically 2 (two) times daily.    . Cyanocobalamin (VITAMIN B-12) 2500 MCG SUBL Place 2,500 mcg under the tongue daily.    . Marland Kitchenextromethorphan Polistirex (DELSYM PO) Take by mouth.    . ENSURE (ENSURE) Take 1 Can by mouth 2 (two) times daily between meals.     . ferrous sulfate 325 (65 FE) MG EC tablet Take 325 mg by mouth daily with breakfast.    . fluticasone (  VERAMYST) 27.5 MCG/SPRAY nasal spray Place 2 sprays into the nose daily.    . folic acid (FOLVITE) 341 MCG tablet Take 1 tablet (800 mcg total) by mouth daily. 30 tablet 1  . levETIRAcetam (KEPPRA) 500 MG tablet TAKE 2 TABLETS BY MOUTH TWICE DAILY FOR 14 DAYS 56 tablet 5  . loratadine (CLARITIN) 10 MG tablet Take 1 tablet (10 mg total) by mouth daily. 30 tablet 0  . mirtazapine (REMERON) 15 MG tablet Take 1 tablet (15 mg total) by mouth at bedtime. 30 tablet 0  . Nutritional Supplements (JUICE PLUS  FIBRE PO) Take 1 capsule by mouth daily. Reported on 07/28/2015    . OVER THE COUNTER MEDICATION "Deep penetrating pain relief oil"    . oxyCODONE-acetaminophen (PERCOCET/ROXICET) 5-325 MG tablet Take 1 tablet by mouth every 6 (six) hours as needed for severe pain. 60 tablet 0  . oxyCODONE-acetaminophen (PERCOCET/ROXICET) 5-325 MG tablet     . prochlorperazine (COMPAZINE) 10 MG tablet TAKE 1 TABLET BY MOUTH EVERY 6 HOURS AS NEEDED FOR NAUSEA OR VOMITING. 30 tablet 0  . ranitidine (ZANTAC) 300 MG tablet Take 1 tablet (300 mg total) by mouth at bedtime. 30 tablet 6  . rivaroxaban (XARELTO) 20 MG TABS tablet TAKE 1 TABLET (20 MG TOTAL) BY MOUTH DAILY WITH SUPPER. 30 tablet 1  . TAGRISSO 80 MG tablet TAKE 1 TABLET (80 MG TOTAL) BY MOUTH DAILY. 30 tablet 11  . TURMERIC PO Take 1 capsule by mouth 2 (two) times daily. Turmeric Superior 750 mg po bid    . XALKORI 250 MG capsule TAKE 1 CAPSULE (250 MG TOTAL) BY MOUTH 2 TIMES DAILY. 60 capsule 2   No current facility-administered medications for this visit.    SURGICAL HISTORY:  Past Surgical History:  Procedure Laterality Date  . IR GENERIC HISTORICAL  07/14/2015   IR RADIOLOGIST EVAL & MGMT 07/14/2015 Greggory Keen, MD [facility] INTERV RAD  . VIDEO BRONCHOSCOPY Bilateral 08/09/2014   Procedure: VIDEO BRONCHOSCOPY WITHOUT FLUORO;  Surgeon: [name], MD;  Location: [facility] ENDOSCOPY;  Service: Cardiopulmonary;  Laterality: Bilateral;  . VIDEO BRONCHOSCOPY Bilateral 07/06/2015   Procedure: VIDEO BRONCHOSCOPY WITHOUT FLUORO;  Surgeon: [name], MD;  Location: [facility] ENDOSCOPY;  Service: Cardiopulmonary;  Laterality: Bilateral;    REVIEW OF SYSTEMS:  A comprehensive review of systems was negative except for: Constitutional: positive for fatigue Neurological: positive for paresthesia   PHYSICAL EXAMINATION: General appearance: alert, cooperative, fatigued and no distress Head: Normocephalic, without obvious abnormality, atraumatic Neck: no  adenopathy, no JVD, supple, symmetrical, trachea midline and thyroid not enlarged, symmetric, no tenderness/mass/nodules Lymph nodes: Cervical, supraclavicular, and axillary nodes normal. Resp: clear to auscultation bilaterally Back: symmetric, no curvature. ROM normal. No CVA tenderness. Cardio: regular rate and rhythm, S1, S2 normal, no murmur, click, rub or gallop GI: soft, non-tender; bowel sounds normal; no masses,  no organomegaly Extremities: extremities normal, atraumatic, no cyanosis or edema  ECOG PERFORMANCE STATUS: 1 - Symptomatic but completely ambulatory  Blood pressure (!) 99/56, pulse 100, temperature 98.2 F (36.8 C), temperature source Oral, resp. rate 20, height 4' 11" (1.499 m), weight 92 lb 12.8 oz (42.1 kg), SpO2 100 %.  LABORATORY DATA: Lab Results  Component Value Date   WBC 3.3 (L) 05/19/2019   HGB 11.2 (L) 05/19/2019   HCT 35.4 (L) 05/19/2019   MCV 92.9 05/19/2019   PLT 311 05/19/2019      Chemistry      Component Value Date/Time   NA 140 05/19/2019 0831  NA 140 01/17/2017 1003   K 3.9 05/19/2019 0831   K 3.9 01/17/2017 1003   CL 107 05/19/2019 0831   CO2 26 05/19/2019 0831   CO2 29 01/17/2017 1003   BUN 12 05/19/2019 0831   BUN 14.8 01/17/2017 1003   CREATININE 0.70 05/19/2019 0831   CREATININE 0.8 01/17/2017 1003      Component Value Date/Time   CALCIUM 8.0 (L) 05/19/2019 0831   CALCIUM 8.7 01/17/2017 1003   ALKPHOS 89 05/19/2019 0831   ALKPHOS 50 01/17/2017 1003   AST 30 05/19/2019 0831   AST 23 01/17/2017 1003   ALT 30 05/19/2019 0831   ALT 27 01/17/2017 1003   BILITOT 0.2 (L) 05/19/2019 0831   BILITOT 0.46 01/17/2017 1003       RADIOGRAPHIC STUDIES: No results found.  ASSESSMENT AND PLAN:  This is a very pleasant 49 years old Asian female with stage IV non-small cell lung cancer, adenocarcinoma with positive EGFR mutation with deletion in exon 54 diagnosed in July 2016 status post treatment with Tarceva for a total of 18 months  but this was discontinued secondary to disease progression and development of T790M resistant mutation. The patient was started on treatment with Tagrisso 80 mg by mouth daily status post 35 months of treatment. She was also found to have MET amplification on repeat molecular studies. In addition to [place], the patient was a started on Xalkori 250 mg p.o. twice daily.  She has some side effect of this treatment with increasing fatigue and weakness as well as nausea and vomiting.  Hulda Humphrey was a change it to 250 once daily at nighttime  The patient has been tolerating this treatment well with no concerning adverse effects. Her lab work is unremarkable today except for mild leukocytopenia.  I recommended for her to continue her current treatment with the same regimen. I will see her back for follow-up visit in 2 months for evaluation with repeat CT scan of the chest, abdomen pelvis for restaging of her disease. For the enlarging adrenal metastasis, the patient will undergo palliative radiotherapy under the care of Dr. Lisbeth Renshaw. For the bone metastasis, she will continue her treatment with Xgeva every 6 weeks. For the deep venous thrombosis the patient is currently on treatment with Xarelto. She was advised to call immediately if she has any concerning symptoms in the interval. The patient voices understanding of current disease status and treatment options and is in agreement with the current care plan. All questions were answered. The patient knows to call the clinic with any problems, questions or concerns. We can certainly see the patient much sooner if necessary.  Disclaimer: This note was dictated with voice recognition software. Similar sounding words can inadvertently be transcribed and may not be corrected upon review.

## 2019-05-20 ENCOUNTER — Ambulatory Visit: Payer: Medicare Other | Admitting: Radiation Oncology

## 2019-05-20 ENCOUNTER — Ambulatory Visit: Payer: Medicare Other

## 2019-05-21 ENCOUNTER — Other Ambulatory Visit: Payer: Self-pay

## 2019-05-21 ENCOUNTER — Ambulatory Visit
Admission: RE | Admit: 2019-05-21 | Discharge: 2019-05-21 | Disposition: A | Discharge status: Home / Self Care | Payer: Medicare Other | Source: Ambulatory Visit | Attending: Radiation Oncology | Admitting: Radiation Oncology

## 2019-05-21 DIAGNOSIS — C3431 Malignant neoplasm of lower lobe, right bronchus or lung: Secondary | ICD-10-CM | POA: Diagnosis not present

## 2019-05-22 ENCOUNTER — Ambulatory Visit: Payer: Medicare Other | Admitting: Radiation Oncology

## 2019-05-25 ENCOUNTER — Other Ambulatory Visit: Payer: Self-pay

## 2019-05-25 ENCOUNTER — Ambulatory Visit
Admission: RE | Admit: 2019-05-25 | Discharge: 2019-05-25 | Disposition: A | Discharge status: Home / Self Care | Payer: Medicare Other | Source: Ambulatory Visit | Attending: Radiation Oncology | Admitting: Radiation Oncology

## 2019-05-25 DIAGNOSIS — C3431 Malignant neoplasm of lower lobe, right bronchus or lung: Secondary | ICD-10-CM | POA: Diagnosis not present

## 2019-05-27 ENCOUNTER — Other Ambulatory Visit: Payer: Self-pay

## 2019-05-27 ENCOUNTER — Ambulatory Visit
Admission: RE | Admit: 2019-05-27 | Discharge: 2019-05-27 | Disposition: A | Discharge status: Home / Self Care | Payer: Medicare Other | Source: Ambulatory Visit | Attending: Radiation Oncology | Admitting: Radiation Oncology

## 2019-05-27 DIAGNOSIS — C3431 Malignant neoplasm of lower lobe, right bronchus or lung: Secondary | ICD-10-CM | POA: Diagnosis not present

## 2019-05-29 ENCOUNTER — Other Ambulatory Visit: Payer: Self-pay

## 2019-05-29 ENCOUNTER — Encounter: Payer: Self-pay | Admitting: Radiation Oncology

## 2019-05-29 ENCOUNTER — Ambulatory Visit
Admission: RE | Admit: 2019-05-29 | Discharge: 2019-05-29 | Disposition: A | Discharge status: Home / Self Care | Payer: Medicare Other | Source: Ambulatory Visit | Attending: Radiation Oncology | Admitting: Radiation Oncology

## 2019-05-29 DIAGNOSIS — C3431 Malignant neoplasm of lower lobe, right bronchus or lung: Secondary | ICD-10-CM | POA: Diagnosis not present

## 2019-06-02 ENCOUNTER — Other Ambulatory Visit: Payer: Self-pay | Admitting: Medical Oncology

## 2019-06-02 DIAGNOSIS — I82442 Acute embolism and thrombosis of left tibial vein: Secondary | ICD-10-CM

## 2019-06-02 MED ORDER — RIVAROXABAN 20 MG PO TABS: ORAL_TABLET | ORAL | 1 refills | Status: DC

## 2019-06-03 MED FILL — XALKORI 250 MG CAPSULE: 250 | 30 days supply | Qty: 60 | Fill #2

## 2019-06-03 MED FILL — TAGRISSO 80 MG TABLET: 80 | 30 days supply | Qty: 30 | Fill #1

## 2019-06-08 MED FILL — XARELTO 20 MG TABLET: 20 | 30 days supply | Qty: 30 | Fill #1

## 2019-06-23 ENCOUNTER — Other Ambulatory Visit: Payer: Self-pay | Admitting: Internal Medicine

## 2019-06-30 ENCOUNTER — Ambulatory Visit
Admission: RE | Admit: 2019-06-30 | Discharge: 2019-06-30 | Disposition: A | Discharge status: Home / Self Care | Payer: Medicare Other | Source: Ambulatory Visit | Attending: Family Medicine | Admitting: Family Medicine

## 2019-06-30 ENCOUNTER — Other Ambulatory Visit: Payer: Self-pay | Admitting: Family Medicine

## 2019-06-30 DIAGNOSIS — R042 Hemoptysis: Secondary | ICD-10-CM

## 2019-06-30 DIAGNOSIS — C3490 Malignant neoplasm of unspecified part of unspecified bronchus or lung: Secondary | ICD-10-CM

## 2019-06-30 MED FILL — TAGRISSO 80 MG TABLET: 80 | 30 days supply | Qty: 30 | Fill #2

## 2019-06-30 MED FILL — XALKORI 250 MG CAPSULE: 250 | 30 days supply | Qty: 60 | Fill #0

## 2019-07-01 ENCOUNTER — Other Ambulatory Visit: Payer: Self-pay | Admitting: Family Medicine

## 2019-07-01 ENCOUNTER — Inpatient Hospital Stay: Payer: Medicare Other

## 2019-07-01 DIAGNOSIS — C3430 Malignant neoplasm of lower lobe, unspecified bronchus or lung: Secondary | ICD-10-CM

## 2019-07-01 DIAGNOSIS — R042 Hemoptysis: Secondary | ICD-10-CM

## 2019-07-01 MED FILL — XARELTO 20 MG TABLET: 20 | 30 days supply | Qty: 30 | Fill #0

## 2019-07-02 ENCOUNTER — Telehealth: Payer: Self-pay | Admitting: *Deleted

## 2019-07-02 ENCOUNTER — Other Ambulatory Visit: Payer: Self-pay | Admitting: Internal Medicine

## 2019-07-02 ENCOUNTER — Ambulatory Visit
Admission: RE | Admit: 2019-07-02 | Discharge: 2019-07-02 | Disposition: A | Discharge status: Home / Self Care | Payer: Medicare Other | Source: Ambulatory Visit | Attending: Family Medicine | Admitting: Family Medicine

## 2019-07-02 ENCOUNTER — Other Ambulatory Visit: Payer: Self-pay

## 2019-07-02 DIAGNOSIS — C7931 Secondary malignant neoplasm of brain: Secondary | ICD-10-CM

## 2019-07-02 DIAGNOSIS — C3430 Malignant neoplasm of lower lobe, unspecified bronchus or lung: Secondary | ICD-10-CM

## 2019-07-02 DIAGNOSIS — R042 Hemoptysis: Secondary | ICD-10-CM

## 2019-07-02 MED ORDER — IOPAMIDOL (ISOVUE-370) INJECTION 76%
75.0000 mL | Freq: Once | INTRAVENOUS | Status: AC | PRN
  Administered 2019-07-02: 75 mL via INTRAVENOUS

## 2019-07-02 MED ORDER — LEVETIRACETAM 250 MG PO TABS: 250.0000 mg | ORAL_TABLET | Freq: Two times a day (BID) | ORAL | 5 refills | Status: DC

## 2019-07-02 MED FILL — levETIRAcetam 250 MG TABS: 250 | 30 days supply | Qty: 60 | Fill #0

## 2019-07-02 NOTE — Telephone Encounter (Signed)
Attempting to get patient scheduled sooner at [facility] instead of [facility] due to recent vision changes.    Also, per Dr. Mickeal Skinner patient needs to try to take Keppra 250 mg BID instead of just once daily.  Refill processed.

## 2019-07-02 NOTE — Telephone Encounter (Signed)
Patient called to report that she has been having worsening vision changes.  Reports having white cloud for approximately 15 minutes and then it goes away and then will return periodically.  She confirms she is only taking Keppra 250 mg once daily.  States she can not tolerate 250 mg BID because she has progressively lost weight from her primary cancer and now weighs approximately 85 lbs.    Scan was due in July, instructed to call GI to get earlier appointment if possible.  Routed to MD to advise if any other changes need to be made.    She also needs refill of Keppra to [facility].

## 2019-07-03 ENCOUNTER — Telehealth: Payer: Self-pay | Admitting: Medical Oncology

## 2019-07-03 ENCOUNTER — Other Ambulatory Visit: Payer: Self-pay | Admitting: Medical Oncology

## 2019-07-03 ENCOUNTER — Other Ambulatory Visit: Payer: Self-pay

## 2019-07-03 ENCOUNTER — Inpatient Hospital Stay: Payer: Medicare Other | Attending: Internal Medicine

## 2019-07-03 ENCOUNTER — Ambulatory Visit (HOSPITAL_COMMUNITY)
Admission: RE | Admit: 2019-07-03 | Discharge: 2019-07-03 | Disposition: A | Discharge status: Home / Self Care | Payer: Medicare Other | Source: Ambulatory Visit | Attending: Internal Medicine | Admitting: Internal Medicine

## 2019-07-03 DIAGNOSIS — C7931 Secondary malignant neoplasm of brain: Secondary | ICD-10-CM

## 2019-07-03 DIAGNOSIS — C3491 Malignant neoplasm of unspecified part of right bronchus or lung: Secondary | ICD-10-CM

## 2019-07-03 DIAGNOSIS — Z79899 Other long term (current) drug therapy: Secondary | ICD-10-CM | POA: Diagnosis not present

## 2019-07-03 DIAGNOSIS — C342 Malignant neoplasm of middle lobe, bronchus or lung: Secondary | ICD-10-CM | POA: Insufficient documentation

## 2019-07-03 DIAGNOSIS — R112 Nausea with vomiting, unspecified: Secondary | ICD-10-CM

## 2019-07-03 DIAGNOSIS — C7971 Secondary malignant neoplasm of right adrenal gland: Secondary | ICD-10-CM | POA: Insufficient documentation

## 2019-07-03 DIAGNOSIS — Z7901 Long term (current) use of anticoagulants: Secondary | ICD-10-CM | POA: Diagnosis not present

## 2019-07-03 DIAGNOSIS — C7972 Secondary malignant neoplasm of left adrenal gland: Secondary | ICD-10-CM | POA: Diagnosis not present

## 2019-07-03 MED ORDER — GADOBUTROL 1 MMOL/ML IV SOLN
5.0000 mL | Freq: Once | INTRAVENOUS | Status: AC | PRN
  Administered 2019-07-03: 5 mL via INTRAVENOUS

## 2019-07-03 MED ORDER — SODIUM CHLORIDE 0.9 % IV SOLN
INTRAVENOUS | Status: DC
  Filled 2019-07-03 (×2): qty 250

## 2019-07-03 NOTE — Progress Notes (Signed)
Labs received from [facility] 06/30/19 Ca = 8.4 Albumin = 3.5 SCr=1.19

## 2019-07-03 NOTE — Telephone Encounter (Signed)
[name] - hemoptysis , weakness ,'chest hurts" not eating or drinking/nausea and vomiting. She wants IVF today.  6/17-Dr Arelia Sneddon ordered CT angio> -"pneumonia , new adrenal lesion"-He prescribed  a ZPAK and zofran-  Dr Arelia Sneddon wants her to see Dr Julien Nordmann.    She is getting brain scan now for vision problems ( [place]).   6/24-injection 7/2-CT a/p 7/6 Deborah Foster

## 2019-07-03 NOTE — Patient Instructions (Signed)

## 2019-07-03 NOTE — Telephone Encounter (Signed)
Pt coming in today for IVF and zofran at 2 .

## 2019-07-03 NOTE — Telephone Encounter (Signed)
We can wait until we have the CT scan of the abdomen and pelvis. The other option is to move the scan earlier and then I will see her after the scan. Thank you.

## 2019-07-06 ENCOUNTER — Telehealth: Payer: Self-pay | Admitting: Internal Medicine

## 2019-07-06 NOTE — Telephone Encounter (Signed)
R/s appt on 7/27. Provider on PAL. Spoke with RN Kim to r/s to 6/24 with injection already scheduled. Pt is aware of appts.

## 2019-07-07 ENCOUNTER — Other Ambulatory Visit: Payer: Self-pay | Admitting: Medical Oncology

## 2019-07-07 ENCOUNTER — Other Ambulatory Visit: Payer: Self-pay | Admitting: *Deleted

## 2019-07-07 ENCOUNTER — Telehealth: Payer: Self-pay | Admitting: Medical Oncology

## 2019-07-07 ENCOUNTER — Inpatient Hospital Stay: Payer: Medicare Other

## 2019-07-07 ENCOUNTER — Other Ambulatory Visit: Payer: Self-pay

## 2019-07-07 DIAGNOSIS — C342 Malignant neoplasm of middle lobe, bronchus or lung: Secondary | ICD-10-CM | POA: Diagnosis not present

## 2019-07-07 DIAGNOSIS — R112 Nausea with vomiting, unspecified: Secondary | ICD-10-CM

## 2019-07-07 MED ORDER — SODIUM CHLORIDE 0.9 % IV SOLN
INTRAVENOUS | Status: DC
  Filled 2019-07-07 (×2): qty 250

## 2019-07-07 NOTE — Patient Instructions (Signed)

## 2019-07-07 NOTE — Telephone Encounter (Signed)
Asking to see [facility] sooner. CT A/P scheduled for June 25 arrive at 715 -NPO 4 hours before and Barium 2 hour and 1 hour prior. Pt notified.   She says is not tolerating the xalkori, cannot eat and she does not think it is helping her cancer.   Asking for IVF today.

## 2019-07-09 ENCOUNTER — Other Ambulatory Visit: Payer: Self-pay

## 2019-07-09 ENCOUNTER — Inpatient Hospital Stay: Payer: Medicare Other

## 2019-07-09 ENCOUNTER — Inpatient Hospital Stay: Payer: Medicare Other | Admitting: Internal Medicine

## 2019-07-09 VITALS — BP 90/56 | HR 115 | Temp 97.8°F | Resp 18 | Ht 59.0 in | Wt 85.6 lb

## 2019-07-09 DIAGNOSIS — C7931 Secondary malignant neoplasm of brain: Secondary | ICD-10-CM

## 2019-07-09 DIAGNOSIS — H547 Unspecified visual loss: Secondary | ICD-10-CM | POA: Diagnosis not present

## 2019-07-09 DIAGNOSIS — C7951 Secondary malignant neoplasm of bone: Secondary | ICD-10-CM

## 2019-07-09 DIAGNOSIS — C3491 Malignant neoplasm of unspecified part of right bronchus or lung: Secondary | ICD-10-CM

## 2019-07-09 DIAGNOSIS — C342 Malignant neoplasm of middle lobe, bronchus or lung: Secondary | ICD-10-CM | POA: Diagnosis not present

## 2019-07-09 DIAGNOSIS — M898X9 Other specified disorders of bone, unspecified site: Secondary | ICD-10-CM

## 2019-07-09 DIAGNOSIS — C3431 Malignant neoplasm of lower lobe, right bronchus or lung: Secondary | ICD-10-CM

## 2019-07-09 LAB — CBC WITH DIFFERENTIAL (CANCER CENTER ONLY)
Abs Immature Granulocytes: 0.05 10*3/uL (ref 0.00–0.07)
Basophils Absolute: 0 10*3/uL (ref 0.0–0.1)
Basophils Relative: 0 %
Eosinophils Absolute: 0.1 10*3/uL (ref 0.0–0.5)
Eosinophils Relative: 1 %
HCT: 31.9 % — ABNORMAL LOW (ref 36.0–46.0)
Hemoglobin: 10.3 g/dL — ABNORMAL LOW (ref 12.0–15.0)
Immature Granulocytes: 1 %
Lymphocytes Relative: 7 %
Lymphs Abs: 0.5 10*3/uL — ABNORMAL LOW (ref 0.7–4.0)
MCH: 29.3 pg (ref 26.0–34.0)
MCHC: 32.3 g/dL (ref 30.0–36.0)
MCV: 90.6 fL (ref 80.0–100.0)
Monocytes Absolute: 0.7 10*3/uL (ref 0.1–1.0)
Monocytes Relative: 10 %
Neutro Abs: 5.8 10*3/uL (ref 1.7–7.7)
Neutrophils Relative %: 81 %
Platelet Count: 324 10*3/uL (ref 150–400)
RBC: 3.52 MIL/uL — ABNORMAL LOW (ref 3.87–5.11)
RDW: 15.9 % — ABNORMAL HIGH (ref 11.5–15.5)
WBC Count: 7.1 10*3/uL (ref 4.0–10.5)
nRBC: 0 % (ref 0.0–0.2)

## 2019-07-09 LAB — CMP (CANCER CENTER ONLY)
ALT: 31 U/L (ref 0–44)
AST: 36 U/L (ref 15–41)
Albumin: 2.6 g/dL — ABNORMAL LOW (ref 3.5–5.0)
Alkaline Phosphatase: 83 U/L (ref 38–126)
Anion gap: 9 (ref 5–15)
BUN: 15 mg/dL (ref 6–20)
CO2: 24 mmol/L (ref 22–32)
Calcium: 8.1 mg/dL — ABNORMAL LOW (ref 8.9–10.3)
Chloride: 102 mmol/L (ref 98–111)
Creatinine: 0.7 mg/dL (ref 0.44–1.00)
GFR, Est AFR Am: >60 mL/min (ref >60)
GFR, Estimated: >60 mL/min (ref >60)
Glucose, Bld: 111 mg/dL — ABNORMAL HIGH (ref 70–99)
Potassium: 4.2 mmol/L (ref 3.5–5.1)
Sodium: 135 mmol/L (ref 135–145)
Total Bilirubin: 0.3 mg/dL (ref 0.3–1.2)
Total Protein: 6.3 g/dL — ABNORMAL LOW (ref 6.5–8.1)

## 2019-07-09 MED ORDER — DENOSUMAB 120 MG/1.7ML ~~LOC~~ SOLN
SUBCUTANEOUS | Status: AC
  Filled 2019-07-09: qty 1.7

## 2019-07-09 MED ORDER — DENOSUMAB 120 MG/1.7ML ~~LOC~~ SOLN
120.0000 mg | Freq: Once | SUBCUTANEOUS | Status: AC
  Administered 2019-07-09: 120 mg via SUBCUTANEOUS

## 2019-07-09 NOTE — Patient Instructions (Addendum)
Epoetin Alfa injection What is this medicine? EPOETIN ALFA (e POE e tin AL fa) helps your body make more red blood cells. This medicine is used to treat anemia caused by chronic kidney disease, cancer chemotherapy, or HIV-therapy. It may also be used before surgery if you have anemia. This medicine may be used for other purposes; ask your health care provider or pharmacist if you have questions. COMMON BRAND NAME(S): Epogen, Procrit, Retacrit What should I tell my health care provider before I take this medicine? They need to know if you have any of these conditions:  cancer  heart disease  high blood pressure  history of blood clots  history of stroke  low levels of folate, iron, or vitamin B12 in the blood  seizures  an unusual or allergic reaction to erythropoietin, albumin, benzyl alcohol, hamster proteins, other medicines, foods, dyes, or preservatives  pregnant or trying to get pregnant  breast-feeding How should I use this medicine? This medicine is for injection into a vein or under the skin. It is usually given by a health care professional in a hospital or clinic setting. If you get this medicine at home, you will be taught how to prepare and give this medicine. Use exactly as directed. Take your medicine at regular intervals. Do not take your medicine more often than directed. It is important that you put your used needles and syringes in a special sharps container. Do not put them in a trash can. If you do not have a sharps container, call your pharmacist or healthcare provider to get one. A special MedGuide will be given to you by the pharmacist with each prescription and refill. Be sure to read this information carefully each time. Talk to your pediatrician regarding the use of this medicine in children. While this drug may be prescribed for selected conditions, precautions do apply. Overdosage: If you think you have taken too much of this medicine contact a poison  control center or emergency room at once. NOTE: This medicine is only for you. Do not share this medicine with others. What if I miss a dose? If you miss a dose, take it as soon as you can. If it is almost time for your next dose, take only that dose. Do not take double or extra doses. What may interact with this medicine? Interactions have not been studied. This list may not describe all possible interactions. Give your health care provider a list of all the medicines, herbs, non-prescription drugs, or dietary supplements you use. Also tell them if you smoke, drink alcohol, or use illegal drugs. Some items may interact with your medicine. What should I watch for while using this medicine? Your condition will be monitored carefully while you are receiving this medicine. You may need blood work done while you are taking this medicine. This medicine may cause a decrease in vitamin B6. You should make sure that you get enough vitamin B6 while you are taking this medicine. Discuss the foods you eat and the vitamins you take with your health care professional. What side effects may I notice from receiving this medicine? Side effects that you should report to your doctor or health care professional as soon as possible:  allergic reactions like skin rash, itching or hives, swelling of the face, lips, or tongue  seizures  signs and symptoms of a blood clot such as breathing problems; changes in vision; chest pain; severe, sudden headache; pain, swelling, warmth in the leg; trouble speaking; sudden numbness or  weakness of the face, arm or leg  signs and symptoms of a stroke like changes in vision; confusion; trouble speaking or understanding; severe headaches; sudden numbness or weakness of the face, arm or leg; trouble walking; dizziness; loss of balance or coordination Side effects that usually do not require medical attention (report to your doctor or health care professional if they continue or are  bothersome):  chills  cough  dizziness  fever  headaches  joint pain  muscle cramps  muscle pain  nausea, vomiting  pain, redness, or irritation at site where injected This list may not describe all possible side effects. Call your doctor for medical advice about side effects. You may report side effects to FDA at 1-800-FDA-1088. Where should I keep my medicine? Keep out of the reach of children. Store in a refrigerator between 2 and 8 degrees C (36 and 46 degrees F). Do not freeze or shake. Throw away any unused portion if using a single-dose vial. Multi-dose vials can be kept in the refrigerator for up to 21 days after the initial dose. Throw away unused medicine. NOTE: This sheet is a summary. It may not cover all possible information. If you have questions about this medicine, talk to your doctor, pharmacist, or health care provider.  2020 Elsevier/Gold Standard (2016-08-10 08:35:19) Denosumab injection What is this medicine? DENOSUMAB (den oh sue mab) slows bone breakdown. Prolia is used to treat osteoporosis in women after menopause and in men, and in people who are taking corticosteroids for 6 months or more. Rivka Barbara is used to treat a high calcium level due to cancer and to prevent bone fractures and other bone problems caused by multiple myeloma or cancer bone metastases. Rivka Barbara is also used to treat giant cell tumor of the bone. This medicine may be used for other purposes; ask your health care provider or pharmacist if you have questions. COMMON BRAND NAME(S): Prolia, XGEVA What should I tell my health care provider before I take this medicine? They need to know if you have any of these conditions:  dental disease  having surgery or tooth extraction  infection  kidney disease  low levels of calcium or Vitamin D in the blood  malnutrition  on hemodialysis  skin conditions or sensitivity  thyroid or parathyroid disease  an unusual reaction to denosumab,  other medicines, foods, dyes, or preservatives  pregnant or trying to get pregnant  breast-feeding How should I use this medicine? This medicine is for injection under the skin. It is given by a health care professional in a hospital or clinic setting. A special MedGuide will be given to you before each treatment. Be sure to read this information carefully each time. For Prolia, talk to your pediatrician regarding the use of this medicine in children. Special care may be needed. For Rivka Barbara, talk to your pediatrician regarding the use of this medicine in children. While this drug may be prescribed for children as young as 13 years for selected conditions, precautions do apply. Overdosage: If you think you have taken too much of this medicine contact a poison control center or emergency room at once. NOTE: This medicine is only for you. Do not share this medicine with others. What if I miss a dose? It is important not to miss your dose. Call your doctor or health care professional if you are unable to keep an appointment. What may interact with this medicine? Do not take this medicine with any of the following medications:  other medicines containing  denosumab This medicine may also interact with the following medications:  medicines that lower your chance of fighting infection  steroid medicines like prednisone or cortisone This list may not describe all possible interactions. Give your health care provider a list of all the medicines, herbs, non-prescription drugs, or dietary supplements you use. Also tell them if you smoke, drink alcohol, or use illegal drugs. Some items may interact with your medicine. What should I watch for while using this medicine? Visit your doctor or health care professional for regular checks on your progress. Your doctor or health care professional may order blood tests and other tests to see how you are doing. Call your doctor or health care professional for advice  if you get a fever, chills or sore throat, or other symptoms of a cold or flu. Do not treat yourself. This drug may decrease your body's ability to fight infection. Try to avoid being around people who are sick. You should make sure you get enough calcium and vitamin D while you are taking this medicine, unless your doctor tells you not to. Discuss the foods you eat and the vitamins you take with your health care professional. See your dentist regularly. Brush and floss your teeth as directed. Before you have any dental work done, tell your dentist you are receiving this medicine. Do not become pregnant while taking this medicine or for 5 months after stopping it. Talk with your doctor or health care professional about your birth control options while taking this medicine. Women should inform their doctor if they wish to become pregnant or think they might be pregnant. There is a potential for serious side effects to an unborn child. Talk to your health care professional or pharmacist for more information. What side effects may I notice from receiving this medicine? Side effects that you should report to your doctor or health care professional as soon as possible:  allergic reactions like skin rash, itching or hives, swelling of the face, lips, or tongue  bone pain  breathing problems  dizziness  jaw pain, especially after dental work  redness, blistering, peeling of the skin  signs and symptoms of infection like fever or chills; cough; sore throat; pain or trouble passing urine  signs of low calcium like fast heartbeat, muscle cramps or muscle pain; pain, tingling, numbness in the hands or feet; seizures  unusual bleeding or bruising  unusually weak or tired Side effects that usually do not require medical attention (report to your doctor or health care professional if they continue or are bothersome):  constipation  diarrhea  headache  joint pain  loss of appetite  muscle  pain  runny nose  tiredness  upset stomach This list may not describe all possible side effects. Call your doctor for medical advice about side effects. You may report side effects to FDA at 1-800-FDA-1088. Where should I keep my medicine? This medicine is only given in a clinic, doctor's office, or other health care setting and will not be stored at home. NOTE: This sheet is a summary. It may not cover all possible information. If you have questions about this medicine, talk to your doctor, pharmacist, or health care provider.  2020 Elsevier/Gold Standard (2017-05-10 16:10:44)

## 2019-07-09 NOTE — Progress Notes (Signed)
[place] at [place], [place] [address] [phone]   Interval Evaluation  Date of Service: 07/09/19 Patient Name: Deborah Foster Patient MRN: [id number] Patient DOB: October 06, 1970 Provider: Ventura Sellers, MD  Identifying Statement:  Deborah Foster is a 49 y.o. female with Metastasis to brain [facility]) [C79.31]   Primary Cancer: Lung adeno w/ EGFR mutation  CNS Oncologic History: 08/23/14-09/07/14:  WBRT 30 Gy in 12 fractions  Interval History:  Deborah Foster presents today following recent MRI brain.  She does describe one episode of sudden onset numbness of right arm and leg last week, self limited.  This was in the context of productive cough, fever and radiographic evidence of pneumonia, treated with azithromycin.  She also had two episodes of transient visual loss at the time, different from her prior episodes we are treating as seizures. Lethargy and appetite are modestly improved.  No gait issues or falls.  She denies headaches today. Still on crizotinib and tagrisso with Dr. Julien Nordmann.  H+P (05/19/18) Patient presents today to review clinical changes and recent MRI brain.  She describes onset of visual changes, described primarily as "episodes of flashing lights".  These episodes occur roughly daily, and tend to last for ~5 minutes before resolving spontaneously.  First episode occurred roughly two weeks ago, very soon after starting crizotinib for progressive EGFR mutant lung adenocarcinoma.  Otherwise vision is not clearly disturbed, maintains full functional independence.  She continues on Tagrisso as well.  She also describes intermittent medial arm numbness, described as "pins and needles" that usually is only symptomatic in the evening.  This has been stable over the past 1 year.    Medications: Current Outpatient Medications on File Prior to Visit  Medication Sig Dispense Refill  . AMBULATORY NON FORMULARY MEDICATION Medication Name: MAGIC  MOUTHWASH 2 % Viscous Lidocaine  Maalox Benadryl Susp.  Disp: 1:1:1 226m bottle Sig: 164mPO Swish & Spit  q 3-4hrs. PRN mouth sores 200 mL 3  . azithromycin (ZITHROMAX) 250 MG tablet Take 250 mg by mouth as directed.    . Calcium Carbonate-Vitamin D (CALCIUM 500 + D) 500-125 MG-UNIT TABS Take 500 mg by mouth 2 (two) times daily.    . clobetasol cream (TEMOVATE) 0.0.25 Apply 1 application topically 2 (two) times daily.    . Cyanocobalamin (VITAMIN B-12) 2500 MCG SUBL Place 2,500 mcg under the tongue daily.    . Marland Kitchenextromethorphan Polistirex (DELSYM PO) Take by mouth.    . ENSURE (ENSURE) Take 1 Can by mouth 2 (two) times daily between meals.     . ferrous sulfate 325 (65 FE) MG EC tablet Take 325 mg by mouth daily with breakfast.    . fluticasone (VERAMYST) 27.5 MCG/SPRAY nasal spray Place 2 sprays into the nose daily.    . folic acid (FOLVITE) 80852CG tablet Take 1 tablet (800 mcg total) by mouth daily. 30 tablet 1  . levETIRAcetam (KEPPRA) 250 MG tablet Take 1 tablet (250 mg total) by mouth 2 (two) times daily. 60 tablet 5  . loratadine (CLARITIN) 10 MG tablet Take 1 tablet (10 mg total) by mouth daily. 30 tablet 0  . mirtazapine (REMERON) 15 MG tablet Take 1 tablet (15 mg total) by mouth at bedtime. 30 tablet 0  . Nutritional Supplements (JUICE PLUS FIBRE PO) Take 1 capsule by mouth daily. Reported on 07/28/2015    . ondansetron (ZOFRAN) 8 MG tablet Take 8 mg by mouth every  8 (eight) hours as needed.    Marland Kitchen OVER THE COUNTER MEDICATION "Deep penetrating pain relief oil"    . oxyCODONE-acetaminophen (PERCOCET/ROXICET) 5-325 MG tablet Take 1 tablet by mouth every 6 (six) hours as needed for severe pain. 60 tablet 0  . oxyCODONE-acetaminophen (PERCOCET/ROXICET) 5-325 MG tablet     . prochlorperazine (COMPAZINE) 10 MG tablet TAKE 1 TABLET BY MOUTH EVERY 6 HOURS AS NEEDED FOR NAUSEA OR VOMITING. 30 tablet 0  . ranitidine (ZANTAC) 300 MG tablet Take 1 tablet (300 mg total) by mouth at bedtime. 30  tablet 6  . rivaroxaban (XARELTO) 20 MG TABS tablet TAKE 1 TABLET (20 MG TOTAL) BY MOUTH DAILY WITH SUPPER. 30 tablet 1  . TAGRISSO 80 MG tablet TAKE 1 TABLET (80 MG TOTAL) BY MOUTH DAILY. 30 tablet 11  . TURMERIC PO Take 1 capsule by mouth 2 (two) times daily. Turmeric Superior 750 mg po bid    . XALKORI 250 MG capsule TAKE 1 CAPSULE BY MOUTH 2 TIMES DAILY. 60 capsule 2   No current facility-administered medications on file prior to visit.    Allergies:  Allergies  Allergen Reactions  . Other Other (See Comments)    Patient states that there is some she takes that makes her throat "cry" AND SCRATCHY   . Doxycycline Nausea And Vomiting   Past Medical History:  Past Medical History:  Diagnosis Date  . Bone metastasis (HCC)   . Hemoptysis   . Hypokalemia   . lung ca dx'd 07/2014  . Lung mass   . Metastasis to adrenal gland (HCC)   . Metastasis to brain (HCC)   . Pneumonia   . Radiation 08/23/14-09/07/14   Brain/chest and left hip 30 Gy 12 Fx  . URI (upper respiratory infection) 02-02-2015   Past Surgical History:  Past Surgical History:  Procedure Laterality Date  . IR GENERIC HISTORICAL  07/14/2015   IR RADIOLOGIST EVAL & MGMT 07/14/2015 Berdine Dance, MD [facility] INTERV RAD  . VIDEO BRONCHOSCOPY Bilateral 08/09/2014   Procedure: VIDEO BRONCHOSCOPY WITHOUT FLUORO;  Surgeon: Lupita Leash, MD;  Location: [facility] ENDOSCOPY;  Service: Cardiopulmonary;  Laterality: Bilateral;  . VIDEO BRONCHOSCOPY Bilateral 07/06/2015   Procedure: VIDEO BRONCHOSCOPY WITHOUT FLUORO;  Surgeon: Lupita Leash, MD;  Location: [facility] ENDOSCOPY;  Service: Cardiopulmonary;  Laterality: Bilateral;   Social History:  Social History   Socioeconomic History  . Marital status: Married    Spouse name: Not on file  . Number of children: 3  . Years of education: Not on file  . Highest education level: Not on file  Occupational History  . Occupation: unemployed  Tobacco Use  . Smoking status: Never Smoker  .  Smokeless tobacco: Never Used  Vaping Use  . Vaping Use: Never used  Substance and Sexual Activity  . Alcohol use: No    Alcohol/week: 0.0 standard drinks  . Drug use: No  . Sexual activity: Not Currently  Other Topics Concern  . Not on file  Social History Narrative   Patient moved to [place] in [place].   Had been in [place] since that time.   Social Determinants of Health   Financial Resource Strain:   . Difficulty of Paying Living Expenses:   Food Insecurity:   . Worried About Programme researcher, broadcasting/film/video in the Last Year:   . Barista in the Last Year:   Transportation Needs:   . Freight forwarder (Medical):   Marland Kitchen Lack of Transportation (Non-Medical):  Physical Activity:   . Days of Exercise per Week:   . Minutes of Exercise per Session:   Stress:   . Feeling of Stress :   Social Connections:   . Frequency of Communication with Friends and Family:   . Frequency of Social Gatherings with Friends and Family:   . Attends Religious Services:   . Active Member of Clubs or Organizations:   . Attends Archivist Meetings:   Marland Kitchen Marital Status:   Intimate Partner Violence:   . Fear of Current or Ex-Partner:   . Emotionally Abused:   Marland Kitchen Physically Abused:   . Sexually Abused:    Family History:  Family History  Problem Relation Age of Onset  . Hyperlipidemia Mother     Review of Systems: Constitutional: Denies fevers, chills or abnormal weight loss Eyes: Denies blurriness of vision Ears, nose, mouth, throat, and face: Denies mucositis or sore throat Respiratory: Denies cough, dyspnea or wheezes Cardiovascular: Denies palpitation, chest discomfort or lower extremity swelling Gastrointestinal:  Denies nausea, constipation, diarrhea GU: Denies dysuria or incontinence Skin: Denies abnormal skin rashes Neurological: Per HPI Musculoskeletal: Denies joint pain, back or neck discomfort. No decrease in ROM Behavioral/Psych: Denies anxiety, disturbance  in thought content, and mood instability   Physical Exam: There were no vitals filed for this visit. KPS: 90. General: Alert, cooperative, pleasant, in no acute distress Head: Normal EENT: No conjunctival injection or scleral icterus. Oral mucosa moist Lungs: Resp effort normal Cardiac: Regular rate and rhythm Abdomen: Soft, non-distended abdomen Skin: No rashes cyanosis or petechiae. Extremities: No clubbing or edema  Neurologic Exam: Mental Status: Awake, alert, attentive to examiner. Oriented to self and environment. Language is fluent with intact comprehension.  Cranial Nerves: Visual acuity is grossly normal. Visual fields are full. Extra-ocular movements intact. No ptosis. Face is symmetric, tongue midline. Motor: Tone and bulk are normal. Power is full in both arms and legs. Reflexes are symmetric, no pathologic reflexes present. Intact finger to nose bilaterally Sensory: Normal Gait: Normal and tandem gait is normal.   Labs: I have reviewed the data as listed    Component Value Date/Time   NA 140 05/19/2019 0831   NA 140 01/17/2017 1003   K 3.9 05/19/2019 0831   K 3.9 01/17/2017 1003   CL 107 05/19/2019 0831   CO2 26 05/19/2019 0831   CO2 29 01/17/2017 1003   GLUCOSE 100 (H) 05/19/2019 0831   GLUCOSE 69 (L) 01/17/2017 1003   BUN 12 05/19/2019 0831   BUN 14.8 01/17/2017 1003   CREATININE 0.70 05/19/2019 0831   CREATININE 0.8 01/17/2017 1003   CALCIUM 8.0 (L) 05/19/2019 0831   CALCIUM 8.7 01/17/2017 1003   PROT 6.6 05/19/2019 0831   PROT 6.4 01/17/2017 1003   ALBUMIN 2.9 (L) 05/19/2019 0831   ALBUMIN 3.6 01/17/2017 1003   AST 30 05/19/2019 0831   AST 23 01/17/2017 1003   ALT 30 05/19/2019 0831   ALT 27 01/17/2017 1003   ALKPHOS 89 05/19/2019 0831   ALKPHOS 50 01/17/2017 1003   BILITOT 0.2 (L) 05/19/2019 0831   BILITOT 0.46 01/17/2017 1003   GFRNONAA >60 05/19/2019 0831   GFRAA >60 05/19/2019 0831   Lab Results  Component Value Date   WBC 3.3 (L)  05/19/2019   NEUTROABS 2.2 05/19/2019   HGB 11.2 (L) 05/19/2019   HCT 35.4 (L) 05/19/2019   MCV 92.9 05/19/2019   PLT 311 05/19/2019    Imaging:  DG Chest 2 View  Result Date:  07/01/2019 CLINICAL DATA:  Pt c/o coughing up blood. Hx of lung cancer. EXAM: CHEST - 2 VIEW COMPARISON:  Chest radiograph 12/18/2017, CT chest 11/19/2018 FINDINGS: Stable cardiomediastinal contours with normal heart size. Post treatment changes are again noted in the right peri and infrahilar region. There is a hazy opacity in the right upper lung which could be infectious or inflammatory. The left lung is clear. No pneumothorax or pleural effusion. No acute finding in the visualized skeleton. IMPRESSION: 1. Hazy opacity in the right upper lung which could be infectious or inflammatory. Given patient history of lung cancer, consider evaluation with chest CT. 2.  Right perihilar post treatment changes. Electronically Signed   By: Audie Pinto M.D.   On: 07/01/2019 09:02   CT ANGIO CHEST PE W OR WO CONTRAST  Result Date: 07/02/2019 CLINICAL DATA:  Hemoptysis with chest pain and elevated D-dimer. History of lung carcinoma EXAM: CT ANGIOGRAPHY CHEST WITH CONTRAST TECHNIQUE: Multidetector CT imaging of the chest was performed using the standard protocol during bolus administration of intravenous contrast. Multiplanar CT image reconstructions and MIPs were obtained to evaluate the vascular anatomy. CONTRAST:  73m ISOVUE-370 IOPAMIDOL (ISOVUE-370) INJECTION 76% COMPARISON:  Chest CT November 19, 2018; chest radiograph June 30, 2019 FINDINGS: Cardiovascular: There is no demonstrable pulmonary embolus. There is no thoracic aortic aneurysm or dissection. The visualized great vessels appear unremarkable. There is no appreciable pericardial effusion or pericardial thickening. Mediastinum/Nodes: There are scattered subcentimeter nodular lesions in the visualized thyroid consistent with multinodular goiter. No dominant thyroid lesion  evident. All current nodular lesions are subcentimeter in size. There is no appreciable thoracic adenopathy. No esophageal lesions are evident. Lungs/Pleura: There is again noted a partially loculated right pleural effusion. There is consolidation with associated bronchiectatic change in the right lower lobe. There is scarring with volume loss throughout portions of the right lower lobe consistent with postoperative and post radiation therapy change. In comparison with prior CT, there is now airspace consolidation in a portion of the posterior segment of the right upper lobe with less confluent airspace opacity throughout much of the remainder of the right upper lobe, primarily anteriorly. This area is felt to represent extensive pneumonia. On the left, there is no appreciable edema or airspace opacity. There is no pleural effusion on the left. Upper Abdomen: In comparison with previous CT study, there is an apparent mass with enhancement in the left adrenal measuring 1.6 x 1.4 cm. Attenuation in this area places it in an indeterminate category with respect to etiology. Neoplastic involvement of the left adrenal must be of concern given clinical history of lung carcinoma. Visualized upper abdominal structures otherwise appear unremarkable. Musculoskeletal: There are scattered sclerotic lesions in the thoracic spine, likely due to bony metastatic disease, potentially post treatment. No lytic or destructive bone lesions are evident. There are no chest wall lesions. Review of the MIP images confirms the above findings. IMPRESSION: 1. No demonstrable pulmonary embolus. No thoracic aortic aneurysm or dissection. 2. Extensive pneumonia in the right upper lobe with consolidation noted primarily in the posterior segment of the right upper lobe with less confluent opacity noted elsewhere in the right upper lobe, primarily anteriorly. 3. Persistent consolidation in the right lower lung region with volume loss and  bronchiectatic change. Small stable loculated pleural effusion in this area. Postoperative and post radiation therapy change in this area is felt to be present, similar to prior study. 4.  No appreciable adenopathy. 5. Apparent mass in the left adrenal with peripheral enhancement  in the left adrenal. This finding was not present previously and raises concern for a metastatic focus to the left adrenal. This finding may warrant nuclear medicine PET study to assess for increased metabolic activity in the left adrenal which would heightened suspicion for metastatic focus. 6. Stable scattered sclerotic bony lesions consistent with metastatic disease, potentially post treatment. No new bone lesions evident. These results will be called to the ordering clinician or representative by the Radiologist Assistant, and communication documented in the PACS or [employer]. Electronically Signed   By: Lowella Grip III M.D.   On: 07/02/2019 17:03   MR Brain W Wo Contrast  Result Date: 07/03/2019 CLINICAL DATA:  Non-small cell lung cancer. Treated brain metastases. EXAM: MRI HEAD WITHOUT AND WITH CONTRAST TECHNIQUE: Multiplanar, multiecho pulse sequences of the brain and surrounding structures were obtained without and with intravenous contrast. CONTRAST:  65m GADAVIST GADOBUTROL 1 MMOL/ML IV SOLN COMPARISON:  MR head without and with contrast 02/06/2019 and 08/08/2018. FINDINGS: BRAIN New Lesions: None. Larger lesions: None. Stable or Smaller lesions: None. Post hemorrhagic lesions are stable. No residual enhancing lesions are present. Most prominent lesions are in the anterior left frontal lobe on image 37, left temporal lobe image 24, and left cerebellum image 16. Other areas of susceptibility and smaller lesions are noted in the posterior right temporal lobe the right occipital lobe, high medial right parietal lobe, and medial anterior right frontal lobe. Other Brain findings: No acute abnormalities are present. No  acute hemorrhage is present. Vascular: Flow is present in the major intracranial arteries. Skull and upper cervical spine: The craniocervical junction is normal. Upper cervical spine is within normal limits. Marrow signal is unremarkable. Sinuses/Orbits: The paranasal sinuses and mastoid air cells are clear. The globes and orbits are within normal limits. IMPRESSION: 1. Stable treated metastatic disease to the brain. The treated lesions demonstrate remote blood products, stable. 2. No residual enhancing lesions. 3. No acute intracranial abnormality or significant interval change. Electronically Signed   By: CSan MorelleM.D.   On: 07/03/2019 17:42    CWanshipClinician Interpretation: I have personally reviewed the radiological images as listed.  My interpretation, in the context of the patient's clinical presentation, is stable disease   Assessment/Plan 1. Metastasis to brain ([facility]  Ms. LTimmonspresents is clinically and radiographically stable today.  Focal seizure was provoked by active infectious process; will not recommend changes to AED regimen.  We recommended continuing Keppra '250mg'$  BID.  Recommended referral to ophthalmology for better characterization of recent visual complaints.  We appreciate the opportunity to participate in the care of Deborah Foster.  She should return to clinic with repeat MRI brain in 6 months or sooner if clinically indicated.      All questions were answered. The patient knows to call the clinic with any problems, questions or concerns. No barriers to learning were detected.  The total time spent in the encounter was 30 minutes and more than 50% was on counseling and review of test results   ZVentura Sellers MD Medical Director of Neuro-Oncology [facility] Hospitalat WRochester06/24/21 12:05 PM

## 2019-07-10 ENCOUNTER — Telehealth: Payer: Self-pay

## 2019-07-10 ENCOUNTER — Encounter (HOSPITAL_COMMUNITY): Payer: Self-pay

## 2019-07-10 ENCOUNTER — Telehealth: Payer: Self-pay | Admitting: Internal Medicine

## 2019-07-10 ENCOUNTER — Ambulatory Visit (HOSPITAL_COMMUNITY)
Admission: RE | Admit: 2019-07-10 | Discharge: 2019-07-10 | Disposition: A | Discharge status: Home / Self Care | Payer: Medicare Other | Source: Ambulatory Visit | Attending: Internal Medicine | Admitting: Internal Medicine

## 2019-07-10 DIAGNOSIS — C3490 Malignant neoplasm of unspecified part of unspecified bronchus or lung: Secondary | ICD-10-CM | POA: Insufficient documentation

## 2019-07-10 MED ORDER — SODIUM CHLORIDE (PF) 0.9 % IJ SOLN
INTRAMUSCULAR | Status: AC
  Filled 2019-07-10: qty 50

## 2019-07-10 MED ORDER — IOHEXOL 300 MG/ML  SOLN
100.0000 mL | Freq: Once | INTRAMUSCULAR | Status: AC | PRN
  Administered 2019-07-10: 100 mL via INTRAVENOUS

## 2019-07-10 NOTE — Telephone Encounter (Signed)
Received call from patient stating she "coughed up blood" when returning home from CT scan today.  Patient states that it did not saturate the tissue and states it happened a couple of times. Dr. Julien Nordmann made aware. Per Dr. Julien Nordmann, patient to monitor this and if she notices the blood amount to increase, she may need to hold her xarelto for a day.  Patient informed to call office if coughing and blood amount becomes worse. Patient verbalized understanding. All questions were answered during phone call.

## 2019-07-10 NOTE — Telephone Encounter (Signed)
Scheduled per 6/24 los. Mailing pt appt calendar.

## 2019-07-13 ENCOUNTER — Inpatient Hospital Stay (HOSPITAL_BASED_OUTPATIENT_CLINIC_OR_DEPARTMENT_OTHER): Payer: Medicare Other | Admitting: Internal Medicine

## 2019-07-13 ENCOUNTER — Other Ambulatory Visit: Payer: Self-pay

## 2019-07-13 ENCOUNTER — Encounter: Payer: Self-pay | Admitting: Internal Medicine

## 2019-07-13 ENCOUNTER — Inpatient Hospital Stay: Payer: Medicare Other

## 2019-07-13 VITALS — BP 95/59 | HR 121 | Temp 97.9°F | Resp 18 | Ht 59.0 in | Wt 84.2 lb

## 2019-07-13 DIAGNOSIS — Z5111 Encounter for antineoplastic chemotherapy: Secondary | ICD-10-CM

## 2019-07-13 DIAGNOSIS — E889 Metabolic disorder, unspecified: Secondary | ICD-10-CM

## 2019-07-13 DIAGNOSIS — C3491 Malignant neoplasm of unspecified part of right bronchus or lung: Secondary | ICD-10-CM | POA: Diagnosis not present

## 2019-07-13 DIAGNOSIS — C7931 Secondary malignant neoplasm of brain: Secondary | ICD-10-CM

## 2019-07-13 DIAGNOSIS — C7972 Secondary malignant neoplasm of left adrenal gland: Secondary | ICD-10-CM

## 2019-07-13 DIAGNOSIS — E278 Other specified disorders of adrenal gland: Secondary | ICD-10-CM

## 2019-07-13 DIAGNOSIS — M9080 Osteopathy in diseases classified elsewhere, unspecified site: Secondary | ICD-10-CM

## 2019-07-13 DIAGNOSIS — M898X9 Other specified disorders of bone, unspecified site: Secondary | ICD-10-CM

## 2019-07-13 DIAGNOSIS — C342 Malignant neoplasm of middle lobe, bronchus or lung: Secondary | ICD-10-CM | POA: Diagnosis not present

## 2019-07-13 DIAGNOSIS — C3431 Malignant neoplasm of lower lobe, right bronchus or lung: Secondary | ICD-10-CM

## 2019-07-13 NOTE — Progress Notes (Signed)
[place] [phone]) [phone]   [phone]  OFFICE PROGRESS NOTE  [name], MD [place] [address]  DIAGNOSIS:  1) Stage IV (T2a, N1, M1b) non-small cell lung cancer, adenocarcinoma with positive EGFR mutation (deletion 11) presented with right middle lobe lung mass, right hilar adenopathy as well as metastatic disease to the bone, brain and left adrenal diagnosed in July 2016. 2) deep venous thrombosis of the left lower extremity diagnosed in March 2020  PRIOR THERAPY: 1) palliative radiotherapy to the brain as well as metastatic bone lesions in the pelvis. 2) Tarceva 150 mg by mouth daily started on 09/25/2014. Status post 5 months of treatment. 3) status post palliative radiotherapy to the right middle lobe lung mass for treatment of hemoptysis. 4) Tarceva 100 mg by mouth daily started 02/19/2015 status post 13 months of treatment. This was discontinued in May 2018 after the patient had disease progression and development of T790M resistant mutation.  5) palliative stereotactic radiotherapy to the left adrenal gland metastatic lesion under the care of Dr. Lisbeth Renshaw completed on April 10, 2017.  CURRENT THERAPY: 1) Tagrisso 80 mg by mouth daily started 06/02/2016.  Status post 37 months of treatment. 2) Xgeva 120 g subcutaneously every 6 weeks. 3) Xalkori 250 mg p.o. twice daily started 05/08/2018.  This was a change it to once daily in July 2020 secondary intolerant intolerance.  INTERVAL HISTORY: Deborah Foster 49 y.o. female returns to the clinic today for follow-up visit initially with online interpreter there later on she was joined by a personal interpreter.  The patient has a lot of question and concern about her current condition and the medication.  She cannot tolerate the treatment with Hulda Humphrey because of a lot of fatigue and weakness as well as nausea and vomiting.  She is to take it in the morning time and she also  tried it at evening time with no improvement.  She also has few episodes of mild hemoptysis and hematemesis secondary to her treatment with Xalkori.  She was asked to hold her treatment with [facility] for now.  She was seen at the emergency department 10 days ago because of the hemoptysis and CT angiogram of the chest at that time showed no evidence of pulmonary embolism but there was suspicious pneumonia of her right lung but this likely radiation pneumonitis.  The reader of the scan indicated the presence of new left adrenal gland nodule but he did not compared to the previous imaging studies of the abdomen.  She has no current fever or chills.  She has no recent weight loss or night sweats.  She had repeat CT scan of the abdomen and pelvis performed recently for evaluation of the adrenal glands after the palliative radiotherapy and she is here for evaluation and discussion of her scan results and treatment options.   MEDICAL HISTORY: Past Medical History:  Diagnosis Date  . Bone metastasis ([place])   . Hemoptysis   . Hypokalemia   . lung ca dx'd 07/2014  . Lung mass   . Metastasis to adrenal gland ([place])   . Metastasis to brain ([place])   . Pneumonia   . Radiation 08/23/14-09/07/14   Brain/chest and left hip 30 Gy 12 Fx  . URI (upper respiratory infection) 02-02-2015    ALLERGIES:  is allergic to other and doxycycline.  MEDICATIONS:  Current Outpatient Medications  Medication Sig Dispense Refill  . AMBULATORY NON FORMULARY MEDICATION Medication Name:  MAGIC MOUTHWASH 2 % Viscous Lidocaine  Maalox Benadryl Susp.  Disp: 1:1:1 268m bottle Sig: 139mPO Swish & Spit  q 3-4hrs. PRN mouth sores 200 mL 3  . azithromycin (ZITHROMAX) 250 MG tablet Take 250 mg by mouth as directed.    . Calcium Carbonate-Vitamin D (CALCIUM 500 + D) 500-125 MG-UNIT TABS Take 500 mg by mouth 2 (two) times daily.    . clobetasol cream (TEMOVATE) 0.6.71 Apply 1 application topically 2 (two) times daily.    . Cyanocobalamin  (VITAMIN B-12) 2500 MCG SUBL Place 2,500 mcg under the tongue daily.    . Marland Kitchenextromethorphan Polistirex (DELSYM PO) Take by mouth.    . ENSURE (ENSURE) Take 1 Can by mouth 2 (two) times daily between meals.     . ferrous sulfate 325 (65 FE) MG EC tablet Take 325 mg by mouth daily with breakfast.    . fluticasone (VERAMYST) 27.5 MCG/SPRAY nasal spray Place 2 sprays into the nose daily.    . folic acid (FOLVITE) 80245CG tablet Take 1 tablet (800 mcg total) by mouth daily. 30 tablet 1  . levETIRAcetam (KEPPRA) 250 MG tablet Take 1 tablet (250 mg total) by mouth 2 (two) times daily. 60 tablet 5  . loratadine (CLARITIN) 10 MG tablet Take 1 tablet (10 mg total) by mouth daily. 30 tablet 0  . mirtazapine (REMERON) 15 MG tablet Take 1 tablet (15 mg total) by mouth at bedtime. 30 tablet 0  . Nutritional Supplements (JUICE PLUS FIBRE PO) Take 1 capsule by mouth daily. Reported on 07/28/2015    . ondansetron (ZOFRAN) 8 MG tablet Take 8 mg by mouth every 8 (eight) hours as needed.    . Marland KitchenVER THE COUNTER MEDICATION "Deep penetrating pain relief oil"    . oxyCODONE-acetaminophen (PERCOCET/ROXICET) 5-325 MG tablet Take 1 tablet by mouth every 6 (six) hours as needed for severe pain. 60 tablet 0  . oxyCODONE-acetaminophen (PERCOCET/ROXICET) 5-325 MG tablet     . prochlorperazine (COMPAZINE) 10 MG tablet TAKE 1 TABLET BY MOUTH EVERY 6 HOURS AS NEEDED FOR NAUSEA OR VOMITING. 30 tablet 0  . ranitidine (ZANTAC) 300 MG tablet Take 1 tablet (300 mg total) by mouth at bedtime. 30 tablet 6  . rivaroxaban (XARELTO) 20 MG TABS tablet TAKE 1 TABLET (20 MG TOTAL) BY MOUTH DAILY WITH SUPPER. 30 tablet 1  . TAGRISSO 80 MG tablet TAKE 1 TABLET (80 MG TOTAL) BY MOUTH DAILY. 30 tablet 11  . TURMERIC PO Take 1 capsule by mouth 2 (two) times daily. Turmeric Superior 750 mg po bid    . XALKORI 250 MG capsule TAKE 1 CAPSULE BY MOUTH 2 TIMES DAILY. 60 capsule 2   No current facility-administered medications for this visit.     SURGICAL HISTORY:  Past Surgical History:  Procedure Laterality Date  . IR GENERIC HISTORICAL  07/14/2015   IR RADIOLOGIST EVAL & MGMT 07/14/2015 MiGreggory KeenMD [facility] INTERV RAD  . VIDEO BRONCHOSCOPY Bilateral 08/09/2014   Procedure: VIDEO BRONCHOSCOPY WITHOUT FLUORO;  Surgeon: DoJuanito DoomMD;  [facility];  Service: Cardiopulmonary;  Laterality: Bilateral;  . VIDEO BRONCHOSCOPY Bilateral 07/06/2015   Procedure: VIDEO BRONCHOSCOPY WITHOUT FLUORO;  Surgeon: DoJuanito DoomMD;  Location: [facility] ENDOSCOPY;  Service: Cardiopulmonary;  Laterality: Bilateral;    REVIEW OF SYSTEMS:  Constitutional: positive for anorexia, fatigue and weight loss Eyes: negative Ears, nose, mouth, throat, and face: negative Respiratory: positive for hemoptysis and pneumonia Cardiovascular: negative Gastrointestinal: positive for nausea Genitourinary:negative Integument/breast: negative  Hematologic/lymphatic: negative Musculoskeletal:positive for back pain Neurological: negative Behavioral/Psych: negative Endocrine: negative Allergic/Immunologic: negative   PHYSICAL EXAMINATION: General appearance: alert, cooperative, fatigued and no distress Head: Normocephalic, without obvious abnormality, atraumatic Neck: no adenopathy, no JVD, supple, symmetrical, trachea midline and thyroid not enlarged, symmetric, no tenderness/mass/nodules Lymph nodes: Cervical, supraclavicular, and axillary nodes normal. Resp: clear to auscultation bilaterally Back: symmetric, no curvature. ROM normal. No CVA tenderness. Cardio: regular rate and rhythm, S1, S2 normal, no murmur, click, rub or gallop GI: soft, non-tender; bowel sounds normal; no masses,  no organomegaly Extremities: extremities normal, atraumatic, no cyanosis or edema Neurologic: Alert and oriented X 3, normal strength and tone. Normal symmetric reflexes. Normal coordination and gait  ECOG PERFORMANCE STATUS: 1 - Symptomatic but completely  ambulatory  Blood pressure (!) 95/59, pulse (!) 121, temperature 97.9 F (36.6 C), temperature source Temporal, resp. rate 18, height _0  (1.499 m), weight 84 lb 3.2 oz (38.2 kg), SpO2 99 %.  LABORATORY DATA: Lab Results  Component Value Date   WBC 7.1 07/09/2019   HGB 10.3 (L) 07/09/2019   HCT 31.9 (L) 07/09/2019   MCV 90.6 07/09/2019   PLT 324 07/09/2019      Chemistry      Component Value Date/Time   NA 135 07/09/2019 1154   NA 140 01/17/2017 1003   K 4.2 07/09/2019 1154   K 3.9 01/17/2017 1003   CL 102 07/09/2019 1154   CO2 24 07/09/2019 1154   CO2 29 01/17/2017 1003   BUN 15 07/09/2019 1154   BUN 14.8 01/17/2017 1003   CREATININE 0.70 07/09/2019 1154   CREATININE 0.8 01/17/2017 1003      Component Value Date/Time   CALCIUM 8.1 (L) 07/09/2019 1154   CALCIUM 8.7 01/17/2017 1003   ALKPHOS 83 07/09/2019 1154   ALKPHOS 50 01/17/2017 1003   AST 36 07/09/2019 1154   AST 23 01/17/2017 1003   ALT 31 07/09/2019 1154   ALT 27 01/17/2017 1003   BILITOT 0.3 07/09/2019 1154   BILITOT 0.46 01/17/2017 1003       RADIOGRAPHIC STUDIES: DG Chest 2 View  Result Date: 07/01/2019 CLINICAL DATA:  Pt c/o coughing up blood. Hx of lung cancer. EXAM: CHEST - 2 VIEW COMPARISON:  Chest radiograph 12/18/2017, CT chest 11/19/2018 FINDINGS: Stable cardiomediastinal contours with normal heart size. Post treatment changes are again noted in the right peri and infrahilar region. There is a hazy opacity in the right upper lung which could be infectious or inflammatory. The left lung is clear. No pneumothorax or pleural effusion. No acute finding in the visualized skeleton. IMPRESSION: 1. Hazy opacity in the right upper lung which could be infectious or inflammatory. Given patient history of lung cancer, consider evaluation with chest CT. 2.  Right perihilar post treatment changes. Electronically Signed   By: Audie Pinto M.D.   On: 07/01/2019 09:02   CT ANGIO CHEST PE W OR WO  CONTRAST  Result Date: 07/02/2019 CLINICAL DATA:  Hemoptysis with chest pain and elevated D-dimer. History of lung carcinoma EXAM: CT ANGIOGRAPHY CHEST WITH CONTRAST TECHNIQUE: Multidetector CT imaging of the chest was performed using the standard protocol during bolus administration of intravenous contrast. Multiplanar CT image reconstructions and MIPs were obtained to evaluate the vascular anatomy. CONTRAST:  9m ISOVUE-370 IOPAMIDOL (ISOVUE-370) INJECTION 76% COMPARISON:  Chest CT November 19, 2018; chest radiograph June 30, 2019 FINDINGS: Cardiovascular: There is no demonstrable pulmonary embolus. There is no thoracic aortic aneurysm or dissection. The visualized great vessels appear  unremarkable. There is no appreciable pericardial effusion or pericardial thickening. Mediastinum/Nodes: There are scattered subcentimeter nodular lesions in the visualized thyroid consistent with multinodular goiter. No dominant thyroid lesion evident. All current nodular lesions are subcentimeter in size. There is no appreciable thoracic adenopathy. No esophageal lesions are evident. Lungs/Pleura: There is again noted a partially loculated right pleural effusion. There is consolidation with associated bronchiectatic change in the right lower lobe. There is scarring with volume loss throughout portions of the right lower lobe consistent with postoperative and post radiation therapy change. In comparison with prior CT, there is now airspace consolidation in a portion of the posterior segment of the right upper lobe with less confluent airspace opacity throughout much of the remainder of the right upper lobe, primarily anteriorly. This area is felt to represent extensive pneumonia. On the left, there is no appreciable edema or airspace opacity. There is no pleural effusion on the left. Upper Abdomen: In comparison with previous CT study, there is an apparent mass with enhancement in the left adrenal measuring 1.6 x 1.4 cm.  Attenuation in this area places it in an indeterminate category with respect to etiology. Neoplastic involvement of the left adrenal must be of concern given clinical history of lung carcinoma. Visualized upper abdominal structures otherwise appear unremarkable. Musculoskeletal: There are scattered sclerotic lesions in the thoracic spine, likely due to bony metastatic disease, potentially post treatment. No lytic or destructive bone lesions are evident. There are no chest wall lesions. Review of the MIP images confirms the above findings. IMPRESSION: 1. No demonstrable pulmonary embolus. No thoracic aortic aneurysm or dissection. 2. Extensive pneumonia in the right upper lobe with consolidation noted primarily in the posterior segment of the right upper lobe with less confluent opacity noted elsewhere in the right upper lobe, primarily anteriorly. 3. Persistent consolidation in the right lower lung region with volume loss and bronchiectatic change. Small stable loculated pleural effusion in this area. Postoperative and post radiation therapy change in this area is felt to be present, similar to prior study. 4.  No appreciable adenopathy. 5. Apparent mass in the left adrenal with peripheral enhancement in the left adrenal. This finding was not present previously and raises concern for a metastatic focus to the left adrenal. This finding may warrant nuclear medicine PET study to assess for increased metabolic activity in the left adrenal which would heightened suspicion for metastatic focus. 6. Stable scattered sclerotic bony lesions consistent with metastatic disease, potentially post treatment. No new bone lesions evident. These results will be called to the ordering clinician or representative by the Radiologist Assistant, and communication documented in the PACS or [employer]. Electronically Signed   By: Lowella Grip III M.D.   On: 07/02/2019 17:03   MR Brain W Wo Contrast  Result Date:  07/03/2019 CLINICAL DATA:  Non-small cell lung cancer. Treated brain metastases. EXAM: MRI HEAD WITHOUT AND WITH CONTRAST TECHNIQUE: Multiplanar, multiecho pulse sequences of the brain and surrounding structures were obtained without and with intravenous contrast. CONTRAST:  70m GADAVIST GADOBUTROL 1 MMOL/ML IV SOLN COMPARISON:  MR head without and with contrast 02/06/2019 and 08/08/2018. FINDINGS: BRAIN New Lesions: None. Larger lesions: None. Stable or Smaller lesions: None. Post hemorrhagic lesions are stable. No residual enhancing lesions are present. Most prominent lesions are in the anterior left frontal lobe on image 37, left temporal lobe image 24, and left cerebellum image 16. Other areas of susceptibility and smaller lesions are noted in the posterior right temporal lobe the right occipital  lobe, high medial right parietal lobe, and medial anterior right frontal lobe. Other Brain findings: No acute abnormalities are present. No acute hemorrhage is present. Vascular: Flow is present in the major intracranial arteries. Skull and upper cervical spine: The craniocervical junction is normal. Upper cervical spine is within normal limits. Marrow signal is unremarkable. Sinuses/Orbits: The paranasal sinuses and mastoid air cells are clear. The globes and orbits are within normal limits. IMPRESSION: 1. Stable treated metastatic disease to the brain. The treated lesions demonstrate remote blood products, stable. 2. No residual enhancing lesions. 3. No acute intracranial abnormality or significant interval change. Electronically Signed   By: San Morelle M.D.   On: 07/03/2019 17:42   CT Abdomen Pelvis W Contrast  Result Date: 07/10/2019 CLINICAL DATA:  Follow-up metastatic non-small cell lung carcinoma. Ongoing chemotherapy. Previous radiation therapy. EXAM: CT ABDOMEN AND PELVIS WITH CONTRAST TECHNIQUE: Multidetector CT imaging of the abdomen and pelvis was performed using the standard protocol following  bolus administration of intravenous contrast. CONTRAST:  130m OMNIPAQUE IOHEXOL 300 MG/ML  SOLN COMPARISON:  04/16/2019 FINDINGS: Lower Chest: No acute findings seen. Stable basilar scarring and bronchiectasis. Hepatobiliary: No hepatic masses identified. Stable tiny cyst in central left lobe. Gallbladder is unremarkable. Diffuse biliary ductal dilatation is unchanged, with common bile duct measuring approximately 13 mm. No obstructing etiology visualized by CT. Pancreas: No mass or inflammatory changes. No evidence of pancreatic ductal dilatation. Spleen: Within normal limits in size and appearance. Adrenals/Urinary Tract: Left adrenal mass is decreased in size since previous study, currently measuring 2.1 x 1.5 cm on image 21/2, compared to 3.1 x 2.0 cm previously. Right adrenal mass is also decreased, currently measuring 2.1 x 1.0 cm on image 21/2, compared to 2.8 x 1.9 cm previously. Both kidneys are normal in appearance. No evidence of hydronephrosis. Stomach/Bowel: No evidence of obstruction, inflammatory process or abnormal fluid collections. Vascular/Lymphatic: No pathologically enlarged lymph nodes. No abdominal aortic aneurysm. Reproductive:  No mass or other significant abnormality. Other:  None. Musculoskeletal: Sclerotic and bone metastases involving the spine and pelvis show no significant change. IMPRESSION: 1. Interval decrease in size of bilateral adrenal metastases. 2. Stable sclerotic bone metastases. 3. No new or progressive metastatic disease identified within the abdomen or pelvis. 4. Stable diffuse biliary ductal dilatation. No obstructing etiology visualized by CT. Electronically Signed   By: JMarlaine HindM.D.   On: 07/10/2019 09:00    ASSESSMENT AND PLAN:  This is a very pleasant 49years old Asian female with stage IV non-small cell lung cancer, adenocarcinoma with positive EGFR mutation with deletion in exon 19 diagnosed in July 2016 status post treatment with Tarceva for a total of  18 months but this was discontinued secondary to disease progression and development of T790M resistant mutation. The patient was started on treatment with Tagrisso 80 mg by mouth daily status post 37 months of treatment. She was also found to have MET amplification on repeat molecular studies. In addition to TW[place]ochet the patient was a started on Xalkori 250 mg p.o. twice daily.  She has some side effect of this treatment with increasing fatigue and weakness as well as nausea and vomiting.  XHulda Humphreywas a change it to 250 once daily at nighttime  The patient has been tolerating her treatment with Tagrisso well but she has a rough time with Xalkori with increasing fatigue and weakness as well as visual changes and nausea. Her scan of the abdomen and pelvis showed improvement of the adrenal gland metastasis after  the radiotherapy and the patient has no concerning evidence for disease progression in the chest. I recommended for her to continue her treatment with Tagrisso.  We will hold her treatment with Hulda Humphrey because of the significant adverse effect and the patient could not tolerate it. For the hemoptysis and hematemesis, she was advised to hold her treatment with Xarelto for 1 more week and if she has any significant hemoptysis or hematemesis to go immediately to the emergency department for evaluation. For the metastatic bone disease she will continue her current treatment with Xgeva every 6 weeks. We will see the patient back for follow-up visit in 1 months for evaluation and repeat blood work. She was advised to call immediately if she has any other concerning symptoms in the interval.  The patient had a lot of questions and it took a long time for this visit to go over her scan and explain the error on the CT scan of the chest that was not compared to previous imaging studies. The patient voices understanding of current disease status and treatment options and is in agreement with the current care  plan. All questions were answered. The patient knows to call the clinic with any problems, questions or concerns. We can certainly see the patient much sooner if necessary. The total time spent in the appointment was 55 minutes. Disclaimer: This note was dictated with voice recognition software. Similar sounding words can inadvertently be transcribed and may not be corrected upon review.

## 2019-07-14 ENCOUNTER — Telehealth: Payer: Self-pay | Admitting: Medical Oncology

## 2019-07-14 ENCOUNTER — Telehealth: Payer: Self-pay | Admitting: *Deleted

## 2019-07-14 NOTE — Telephone Encounter (Signed)
Medication question-Pt asking if she can take Tagrisso 2 tablets a day.   I told her NO , she must not  take 2 tablets ,it can be fatal. She said she will only take tagrisso 80 mg daily.

## 2019-07-14 NOTE — Telephone Encounter (Signed)
Referral to [facility] completed.  Records and request faxed to [phone].

## 2019-07-17 ENCOUNTER — Other Ambulatory Visit: Payer: Medicare Other

## 2019-07-17 ENCOUNTER — Ambulatory Visit (HOSPITAL_COMMUNITY): Payer: Medicare Other

## 2019-07-21 ENCOUNTER — Ambulatory Visit: Payer: Medicare Other | Admitting: Internal Medicine

## 2019-07-27 MED FILL — TAGRISSO 80 MG TABLET: 80 | 30 days supply | Qty: 30 | Fill #3

## 2019-07-27 MED FILL — levETIRAcetam 250 MG TABS: 250 | 30 days supply | Qty: 60 | Fill #1

## 2019-07-29 ENCOUNTER — Telehealth: Payer: Self-pay | Admitting: Radiation Oncology

## 2019-07-29 ENCOUNTER — Other Ambulatory Visit: Payer: Medicare Other

## 2019-07-29 NOTE — Telephone Encounter (Signed)
  Radiation Oncology         [phone] ________________________________  Name: Deborah Foster MRN: [id number]  Date of Service: 07/29/2019  DOB: 1970/05/16  Post Treatment Telephone Note  Diagnosis:   Progressive Metastatic Stage IV T2a, N1, M1b NSCLC, adenocarcinoma, positive EGFR mutation with deletion in exon 19, of the right middle lobe.   Interval Since Last Radiation:  9 weeks   05/19/19-05/29/19: The RUL target and right adrenal gland targets were each treated to 60 Gy in 5 fractions.  04/16/2018-04/29/2018: The left supraclavicular nodes and retroperitoneal adenopathy was treated to 30 GY in 10 fractions  04/01/2017 - 04/10/2017: Left Adrenal Gland / 40 Gy in 5 fractions  07/21/2015 to 08/03/2015:  The Right hilar tumor was treated to 30 Gy in 10 fractions at 3 Gy per fraction.   08/23/14-09/07/14:  30 Gy in 12 fractions was prescribed to the right lung, whole brain, and left hip.   Narrative:  The patient was contacted today for routine follow-up. During treatment she did very well with radiotherapy and did not have significant desquamation. She was not a good candidate for re-treatment of the left adrenal gland, due to her previous fields of radiotherapy, so the right adrenal and the RUL were the sites treated. She had a CT scan of the chest in June and had a pneumonia at that time which was treated by Dr. Julien Nordmann and her PCP. She had hemoptysis at that time, but her bleeding has resolved. She does report she is having some pain in her chest with coughing and has persistent cough. No fevers or other progressive symptoms are noted.   Impression/Plan: 1. Progressive Metastatic Stage IV T2a, N1, M1b NSCLC, adenocarcinoma of the right middle lobe. The patient has been doing well since completion of radiotherapy. I will touch base with Dr. Worthy Flank nurse to notify her of the patient's persistent symptoms. We discussed that we would be happy to continue to follow her as needed, but she will  also continue to follow up with Dr. Julien Nordmann in medical oncology and will continue with the brain oncology program with Dr. Mickeal Skinner in neuro oncology.       Carola Rhine, PAC

## 2019-07-29 NOTE — Telephone Encounter (Signed)
We can bring her to [facility] if needed.

## 2019-07-30 ENCOUNTER — Telehealth: Payer: Self-pay | Admitting: Internal Medicine

## 2019-07-30 NOTE — Telephone Encounter (Signed)
Scheduled appt per 7/14 sch msg - pt is aware of appt .

## 2019-08-03 ENCOUNTER — Ambulatory Visit (HOSPITAL_COMMUNITY)
Admission: RE | Admit: 2019-08-03 | Discharge: 2019-08-03 | Disposition: A | Discharge status: Home / Self Care | Payer: Medicare Other | Source: Ambulatory Visit | Attending: Medical | Admitting: Medical

## 2019-08-03 ENCOUNTER — Other Ambulatory Visit: Payer: Self-pay

## 2019-08-03 ENCOUNTER — Inpatient Hospital Stay: Payer: Medicare Other | Attending: Internal Medicine | Admitting: Medical

## 2019-08-03 ENCOUNTER — Other Ambulatory Visit: Payer: Self-pay | Admitting: Medical

## 2019-08-03 ENCOUNTER — Inpatient Hospital Stay: Payer: Medicare Other

## 2019-08-03 VITALS — BP 90/59 | HR 103 | Temp 97.5°F | Resp 18 | Ht 59.0 in | Wt 85.3 lb

## 2019-08-03 DIAGNOSIS — C778 Secondary and unspecified malignant neoplasm of lymph nodes of multiple regions: Secondary | ICD-10-CM | POA: Insufficient documentation

## 2019-08-03 DIAGNOSIS — C7951 Secondary malignant neoplasm of bone: Secondary | ICD-10-CM | POA: Diagnosis not present

## 2019-08-03 DIAGNOSIS — C3490 Malignant neoplasm of unspecified part of unspecified bronchus or lung: Secondary | ICD-10-CM

## 2019-08-03 DIAGNOSIS — C7972 Secondary malignant neoplasm of left adrenal gland: Secondary | ICD-10-CM

## 2019-08-03 DIAGNOSIS — R059 Cough, unspecified: Secondary | ICD-10-CM

## 2019-08-03 DIAGNOSIS — R05 Cough: Secondary | ICD-10-CM | POA: Diagnosis present

## 2019-08-03 DIAGNOSIS — Z9221 Personal history of antineoplastic chemotherapy: Secondary | ICD-10-CM | POA: Insufficient documentation

## 2019-08-03 DIAGNOSIS — R0789 Other chest pain: Secondary | ICD-10-CM

## 2019-08-03 DIAGNOSIS — C7931 Secondary malignant neoplasm of brain: Secondary | ICD-10-CM | POA: Insufficient documentation

## 2019-08-03 DIAGNOSIS — Z79899 Other long term (current) drug therapy: Secondary | ICD-10-CM | POA: Insufficient documentation

## 2019-08-03 DIAGNOSIS — C342 Malignant neoplasm of middle lobe, bronchus or lung: Secondary | ICD-10-CM | POA: Insufficient documentation

## 2019-08-03 DIAGNOSIS — C770 Secondary and unspecified malignant neoplasm of lymph nodes of head, face and neck: Secondary | ICD-10-CM

## 2019-08-03 DIAGNOSIS — Z86718 Personal history of other venous thrombosis and embolism: Secondary | ICD-10-CM | POA: Insufficient documentation

## 2019-08-03 DIAGNOSIS — C772 Secondary and unspecified malignant neoplasm of intra-abdominal lymph nodes: Secondary | ICD-10-CM

## 2019-08-03 DIAGNOSIS — Z923 Personal history of irradiation: Secondary | ICD-10-CM | POA: Insufficient documentation

## 2019-08-03 DIAGNOSIS — Z7901 Long term (current) use of anticoagulants: Secondary | ICD-10-CM | POA: Diagnosis not present

## 2019-08-03 DIAGNOSIS — R0602 Shortness of breath: Secondary | ICD-10-CM

## 2019-08-03 LAB — CBC WITH DIFFERENTIAL (CANCER CENTER ONLY)
Abs Immature Granulocytes: 0.01 10*3/uL (ref 0.00–0.07)
Basophils Absolute: 0 10*3/uL (ref 0.0–0.1)
Basophils Relative: 1 %
Eosinophils Absolute: 0.1 10*3/uL (ref 0.0–0.5)
Eosinophils Relative: 1 %
HCT: 33 % — ABNORMAL LOW (ref 36.0–46.0)
Hemoglobin: 10.5 g/dL — ABNORMAL LOW (ref 12.0–15.0)
Immature Granulocytes: 0 %
Lymphocytes Relative: 14 %
Lymphs Abs: 0.5 10*3/uL — ABNORMAL LOW (ref 0.7–4.0)
MCH: 30.3 pg (ref 26.0–34.0)
MCHC: 31.8 g/dL (ref 30.0–36.0)
MCV: 95.1 fL (ref 80.0–100.0)
Monocytes Absolute: 0.6 10*3/uL (ref 0.1–1.0)
Monocytes Relative: 16 %
Neutro Abs: 2.4 10*3/uL (ref 1.7–7.7)
Neutrophils Relative %: 68 %
Platelet Count: 281 10*3/uL (ref 150–400)
RBC: 3.47 MIL/uL — ABNORMAL LOW (ref 3.87–5.11)
RDW: 17.4 % — ABNORMAL HIGH (ref 11.5–15.5)
WBC Count: 3.5 10*3/uL — ABNORMAL LOW (ref 4.0–10.5)
nRBC: 0 % (ref 0.0–0.2)

## 2019-08-03 LAB — CMP (CANCER CENTER ONLY)
ALT: 83 U/L — ABNORMAL HIGH (ref 0–44)
AST: 54 U/L — ABNORMAL HIGH (ref 15–41)
Albumin: 3.2 g/dL — ABNORMAL LOW (ref 3.5–5.0)
Alkaline Phosphatase: 93 U/L (ref 38–126)
Anion gap: 8 (ref 5–15)
BUN: 18 mg/dL (ref 6–20)
CO2: 28 mmol/L (ref 22–32)
Calcium: 8.9 mg/dL (ref 8.9–10.3)
Chloride: 103 mmol/L (ref 98–111)
Creatinine: 0.71 mg/dL (ref 0.44–1.00)
GFR, Est AFR Am: >60 mL/min (ref >60)
GFR, Estimated: >60 mL/min (ref >60)
Glucose, Bld: 81 mg/dL (ref 70–99)
Potassium: 4.5 mmol/L (ref 3.5–5.1)
Sodium: 139 mmol/L (ref 135–145)
Total Bilirubin: 0.3 mg/dL (ref 0.3–1.2)
Total Protein: 7.1 g/dL (ref 6.5–8.1)

## 2019-08-03 MED ORDER — GUAIFENESIN-CODEINE 100-10 MG/5ML PO SYRP: 5.0000 mL | ORAL_SOLUTION | Freq: Three times a day (TID) | ORAL | 0 refills | Status: DC | PRN

## 2019-08-03 MED ORDER — AMOXICILLIN-POT CLAVULANATE 875-125 MG PO TABS: 1.0000 | ORAL_TABLET | Freq: Two times a day (BID) | ORAL | 0 refills | Status: DC

## 2019-08-03 MED FILL — AMOX-CLAV 875-125 MG TABLET: 875-125 | 7 days supply | Qty: 14 | Fill #0

## 2019-08-03 MED FILL — GUAIATUSSIN AC LIQUID: 100-10 | 8 days supply | Qty: 120 | Fill #0

## 2019-08-03 NOTE — Progress Notes (Signed)
[facility] Progress Note   Deborah Foster [id number] 1970/03/16 49 y.o.  Deborah Foster is managed by Dr. Fanny Bien. Deborah Foster  Actively treated with chemotherapy/immunotherapy/hormonal therapy: yes  Current therapy:  1) Tagrisso 80 mg by mouth daily started 06/02/2016.  Status post 37 months of treatment. 2) Xgeva 120 g subcutaneously every 6 weeks. 3) Xalkori 250 mg p.o. twice daily started 05/08/2018.  This was a change it to once daily in July 2020 secondary intolerant intolerance.  Next scheduled appointment with provider: 08/10/2019 (Dr. Julien Nordmann) 02/08/2020 (Dr. 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 continues to be followed by Dr. Julien Nordmann and is currently treated with Tagrisso 80 mg by mouth daily (status post 37 months of treatment),  Xgeva 120 g subcutaneously every 6 weeks and Xalkori 250 mg p.o. once daily. She has also been treated with palliative radiation.   Cough and shortness of breath: The patient was referred for a chest x-ray which returned showing:  FINDINGS: Volume loss with pleuroparenchymal opacity in the right lung is again noted, with progressive right suprahilar focal airspace opacity.  Left lung clear.  The cardiopericardial silhouette is within normal limits for size.  The visualized bony structures of the thorax show now acute abnormality.  IMPRESSION: Interval progression of focal airspace disease in the right suprahilar lung.  Otherwise no substantial change.  These results were reviewed with Dr. Julien Nordmann.  The patient was given a  prescription for Augmentin 875-125 p.o. twice daily x7 days and was given a prescription for Robitussin with codeine.   Please see After Visit Summary for patient specific instructions.  Future Appointments  Date Time Provider [place]  08/03/2019 11:00 AM [facility] ONLY [facility] None  08/03/2019 11:30 AM Chrisa Hassan, Lucianne Lei E., PA-C [facility] None  08/10/2019 11:00 AM [facility] LAB 3 [facility] None  08/10/2019 11:30 AM [name], MD [facility] None  02/08/2020  9:30 AM Vaslow, Acey Lav, MD [facility] None    No orders of the defined types were placed in this encounter.      Subjective:   Patient ID:  Deborah Foster is a 49 y.o. (DOB 1970-10-11) female.  Chief Complaint: No chief complaint on file.   HPI Deborah Foster  is a 49 y.o. female with a diagnosis of a metastatic non-small cell lung cancer with adrenal, brain, bone and lymph node metastasis.  She is followed by Dr. Julien Nordmann and is currently treated with Tagrisso 80 mg by mouth daily (status post 37 months of treatment),  Xgeva 120 g subcutaneously every 6 weeks and Xalkori 250 mg p.o. once daily. She has also been treated with palliative radiation to the RUL and right adrenal gland from 05/19/19-05/29/19, left supraclavicular nodes and retroperitoneal adenopathy from 04/16/2018-04/29/2018, left adrenal gland from 04/01/2017 - 04/10/2017, right hilar tumor from 07/21/2015 to 08/03/2015 and right lung, whole brain, and left hip from 08/23/14-09/07/14. She was in contact with the radiation oncology PA on 07/14 when she reported that she was having a continued cough and chest pain. Of note, she was diagnosed with pneumonia approximately one month ago. The patient was referred for  a chest x-ray which returned showing:  FINDINGS: Volume loss with pleuroparenchymal opacity in the right lung is again noted, with progressive right suprahilar focal airspace opacity.  Left lung clear.  The cardiopericardial silhouette is within normal  limits for size.  The visualized bony structures of the thorax show now acute abnormality.  IMPRESSION: Interval progression of focal airspace disease in the right suprahilar lung.  Otherwise no substantial change.   Medications: I have reviewed the patient's current medications.  Allergies:  Allergies  Allergen Reactions  . Other Other (See Comments)    Patient states that there is some she takes that makes her throat "cry" AND SCRATCHY   . Doxycycline Nausea And Vomiting    Past Medical History:  Diagnosis Date  . Bone metastasis ([place])   . Hemoptysis   . Hypokalemia   . lung ca dx'd 07/2014  . Lung mass   . Metastasis to adrenal gland ([place])   . Metastasis to brain ([place])   . Pneumonia   . Radiation 08/23/14-09/07/14   Brain/chest and left hip 30 Gy 12 Fx  . URI (upper respiratory infection) 02-02-2015    Past Surgical History:  Procedure Laterality Date  . IR GENERIC HISTORICAL  07/14/2015   IR RADIOLOGIST EVAL & MGMT 07/14/2015 Greggory Keen, MD [facility] INTERV RAD  . VIDEO BRONCHOSCOPY Bilateral 08/09/2014   Procedure: VIDEO BRONCHOSCOPY WITHOUT FLUORO;  Surgeon: [name], MD;  Location[facility] ENDOSCOPY;  Service: Cardiopulmonary;  Laterality: Bilateral;  . VIDEO BRONCHOSCOPY Bilateral 07/06/2015   Procedure: VIDEO BRONCHOSCOPY WITHOUT FLUORO;  Surgeon: [name], MD;  Location: [facility] ENDOSCOPY;  Service: Cardiopulmonary;  Laterality: Bilateral;    Family History  Problem Relation Age of Onset  . Hyperlipidemia Mother     Social History   Socioeconomic History  . Marital status: Married    Spouse name: Not on file  . Number of children: 3  . Years of education: Not on file  . Highest education level: Not on file  Occupational History  . Occupation: unemployed  Tobacco Use  . Smoking status: Never Smoker  . Smokeless tobacco: Never Used  Vaping Use  . Vaping Use: Never used  Substance and Sexual Activity  . Alcohol use: No    Alcohol/week: 0.0  standard drinks  . Drug use: No  . Sexual activity: Not Currently  Other Topics Concern  . Not on file  Social History Narrative   Patient moved to [place] in [place].   Had been in [place] since that time.   Social Determinants of Health   Financial Resource Strain:   . Difficulty of Paying Living Expenses:   Food Insecurity:   . Worried About Charity fundraiser in the Last Year:   . Arboriculturist in the Last Year:   Transportation Needs:   . Film/video editor (Medical):   Marland Kitchen Lack of Transportation (Non-Medical):   Physical Activity:   . Days of Exercise per Week:   . Minutes of Exercise per Session:   Stress:   . Feeling of Stress :   Social Connections:   . Frequency of Communication with Friends and Family:   . Frequency of Social Gatherings with Friends and Family:   . Attends Religious Services:   . Active Member of Clubs or Organizations:   . Attends Archivist Meetings:   Marland Kitchen Marital Status:   Intimate Partner Violence:   . Fear of Current or Ex-Partner:   . Emotionally  Abused:   Marland Kitchen Physically Abused:   . Sexually Abused:     Past Medical History, Surgical history, Social history, and Family history were reviewed and updated as appropriate.   Please see review of systems for further details on the patient's review from today.   Review of Systems:  Review of Systems  Constitutional: Negative for chills, diaphoresis and fever.  HENT: Negative for trouble swallowing.   Respiratory: Positive for shortness of breath. Negative for cough, choking, chest tightness, wheezing and stridor.   Cardiovascular: Positive for chest pain. Negative for palpitations.  Gastrointestinal: Negative for constipation, diarrhea, nausea and vomiting.  Neurological: Negative for weakness and numbness.    Objective:   Physical Exam:  There were no vitals taken for this visit. ECOG: 1  Physical Exam Constitutional:      General: She is not in acute  distress.    Appearance: She is not diaphoretic.  HENT:     Head: Normocephalic and atraumatic.  Eyes:     General: No scleral icterus.       Right eye: No discharge.        Left eye: No discharge.     Conjunctiva/sclera: Conjunctivae normal.  Cardiovascular:     Rate and Rhythm: Normal rate and regular rhythm.     Heart sounds: Normal heart sounds. No murmur heard.  No friction rub. No gallop.   Pulmonary:     Effort: Pulmonary effort is normal. No respiratory distress.     Breath sounds: Normal breath sounds. No stridor. No wheezing or rales.  Skin:    General: Skin is warm and dry.     Coloration: Skin is not pale.     Findings: No erythema.  Neurological:     Mental Status: She is alert.     Coordination: Coordination normal.     Gait: Gait normal.  Psychiatric:        Behavior: Behavior normal.        Thought Content: Thought content normal.        Judgment: Judgment normal.     Lab Review:     Component Value Date/Time   NA 135 07/09/2019 1154   NA 140 01/17/2017 1003   K 4.2 07/09/2019 1154   K 3.9 01/17/2017 1003   CL 102 07/09/2019 1154   CO2 24 07/09/2019 1154   CO2 29 01/17/2017 1003   GLUCOSE 111 (H) 07/09/2019 1154   GLUCOSE 69 (L) 01/17/2017 1003   BUN 15 07/09/2019 1154   BUN 14.8 01/17/2017 1003   CREATININE 0.70 07/09/2019 1154   CREATININE 0.8 01/17/2017 1003   CALCIUM 8.1 (L) 07/09/2019 1154   CALCIUM 8.7 01/17/2017 1003   PROT 6.3 (L) 07/09/2019 1154   PROT 6.4 01/17/2017 1003   ALBUMIN 2.6 (L) 07/09/2019 1154   ALBUMIN 3.6 01/17/2017 1003   AST 36 07/09/2019 1154   AST 23 01/17/2017 1003   ALT 31 07/09/2019 1154   ALT 27 01/17/2017 1003   ALKPHOS 83 07/09/2019 1154   ALKPHOS 50 01/17/2017 1003   BILITOT 0.3 07/09/2019 1154   BILITOT 0.46 01/17/2017 1003   GFRNONAA >60 07/09/2019 1154   GFRAA >60 07/09/2019 1154       Component Value Date/Time   WBC 7.1 07/09/2019 1154   WBC 5.5 03/31/2018 0952   RBC 3.52 (L) 07/09/2019 1154    HGB 10.3 (L) 07/09/2019 1154   HGB 12.9 01/17/2017 1003   HCT 31.9 (L) 07/09/2019 1154   HCT 39.6 01/17/2017 1003  PLT 324 07/09/2019 1154   PLT 258 01/17/2017 1003   MCV 90.6 07/09/2019 1154   MCV 95.7 01/17/2017 1003   MCH 29.3 07/09/2019 1154   MCHC 32.3 07/09/2019 1154   RDW 15.9 (H) 07/09/2019 1154   RDW 13.0 01/17/2017 1003   LYMPHSABS 0.5 (L) 07/09/2019 1154   LYMPHSABS 1.4 01/17/2017 1003   MONOABS 0.7 07/09/2019 1154   MONOABS 0.6 01/17/2017 1003   EOSABS 0.1 07/09/2019 1154   EOSABS 0.1 01/17/2017 1003   BASOSABS 0.0 07/09/2019 1154   BASOSABS 0.0 01/17/2017 1003   -------------------------------  Imaging from last 24 hours (if applicable):  Radiology interpretation: CT Abdomen Pelvis W Contrast  Result Date: 07/10/2019 CLINICAL DATA:  Follow-up metastatic non-small cell lung carcinoma. Ongoing chemotherapy. Previous radiation therapy. EXAM: CT ABDOMEN AND PELVIS WITH CONTRAST TECHNIQUE: Multidetector CT imaging of the abdomen and pelvis was performed using the standard protocol following bolus administration of intravenous contrast. CONTRAST:  150mL OMNIPAQUE IOHEXOL 300 MG/ML  SOLN COMPARISON:  04/16/2019 FINDINGS: Lower Chest: No acute findings seen. Stable basilar scarring and bronchiectasis. Hepatobiliary: No hepatic masses identified. Stable tiny cyst in central left lobe. Gallbladder is unremarkable. Diffuse biliary ductal dilatation is unchanged, with common bile duct measuring approximately 13 mm. No obstructing etiology visualized by CT. Pancreas: No mass or inflammatory changes. No evidence of pancreatic ductal dilatation. Spleen: Within normal limits in size and appearance. Adrenals/Urinary Tract: Left adrenal mass is decreased in size since previous study, currently measuring 2.1 x 1.5 cm on image 21/2, compared to 3.1 x 2.0 cm previously. Right adrenal mass is also decreased, currently measuring 2.1 x 1.0 cm on image 21/2, compared to 2.8 x 1.9 cm previously. Both  kidneys are normal in appearance. No evidence of hydronephrosis. Stomach/Bowel: No evidence of obstruction, inflammatory process or abnormal fluid collections. Vascular/Lymphatic: No pathologically enlarged lymph nodes. No abdominal aortic aneurysm. Reproductive:  No mass or other significant abnormality. Other:  None. Musculoskeletal: Sclerotic and bone metastases involving the spine and pelvis show no significant change. IMPRESSION: 1. Interval decrease in size of bilateral adrenal metastases. 2. Stable sclerotic bone metastases. 3. No new or progressive metastatic disease identified within the abdomen or pelvis. 4. Stable diffuse biliary ductal dilatation. No obstructing etiology visualized by CT. Electronically Signed   By: Marlaine Hind M.D.   On: 07/10/2019 09:00        This case was discussed with Dr. Julien Nordmann. He expressed agreement with my management of this patient.

## 2019-08-07 ENCOUNTER — Other Ambulatory Visit: Payer: Medicare Other

## 2019-08-10 ENCOUNTER — Other Ambulatory Visit: Payer: Self-pay

## 2019-08-10 ENCOUNTER — Inpatient Hospital Stay: Payer: Medicare Other

## 2019-08-10 ENCOUNTER — Telehealth: Payer: Self-pay | Admitting: *Deleted

## 2019-08-10 ENCOUNTER — Ambulatory Visit: Payer: Medicare Other

## 2019-08-10 ENCOUNTER — Inpatient Hospital Stay: Payer: Medicare Other | Admitting: Internal Medicine

## 2019-08-10 ENCOUNTER — Encounter: Payer: Self-pay | Admitting: Internal Medicine

## 2019-08-10 VITALS — BP 95/26 | HR 110 | Temp 97.3°F | Resp 20 | Ht 59.0 in | Wt 83.7 lb

## 2019-08-10 DIAGNOSIS — Z5111 Encounter for antineoplastic chemotherapy: Secondary | ICD-10-CM

## 2019-08-10 DIAGNOSIS — C3490 Malignant neoplasm of unspecified part of unspecified bronchus or lung: Secondary | ICD-10-CM | POA: Diagnosis not present

## 2019-08-10 DIAGNOSIS — C3491 Malignant neoplasm of unspecified part of right bronchus or lung: Secondary | ICD-10-CM | POA: Diagnosis not present

## 2019-08-10 DIAGNOSIS — C7972 Secondary malignant neoplasm of left adrenal gland: Secondary | ICD-10-CM | POA: Diagnosis not present

## 2019-08-10 DIAGNOSIS — C3431 Malignant neoplasm of lower lobe, right bronchus or lung: Secondary | ICD-10-CM | POA: Diagnosis not present

## 2019-08-10 DIAGNOSIS — C342 Malignant neoplasm of middle lobe, bronchus or lung: Secondary | ICD-10-CM | POA: Diagnosis not present

## 2019-08-10 LAB — CMP (CANCER CENTER ONLY)
ALT: 66 U/L — ABNORMAL HIGH (ref 0–44)
AST: 44 U/L — ABNORMAL HIGH (ref 15–41)
Albumin: 3.6 g/dL (ref 3.5–5.0)
Alkaline Phosphatase: 99 U/L (ref 38–126)
Anion gap: 10 (ref 5–15)
BUN: 16 mg/dL (ref 6–20)
CO2: 25 mmol/L (ref 22–32)
Calcium: 10 mg/dL (ref 8.9–10.3)
Chloride: 104 mmol/L (ref 98–111)
Creatinine: 0.87 mg/dL (ref 0.44–1.00)
GFR, Est AFR Am: >60 mL/min (ref >60)
GFR, Estimated: >60 mL/min (ref >60)
Glucose, Bld: 85 mg/dL (ref 70–99)
Potassium: 4.4 mmol/L (ref 3.5–5.1)
Sodium: 139 mmol/L (ref 135–145)
Total Bilirubin: 0.3 mg/dL (ref 0.3–1.2)
Total Protein: 7.8 g/dL (ref 6.5–8.1)

## 2019-08-10 LAB — CBC WITH DIFFERENTIAL (CANCER CENTER ONLY)
Abs Immature Granulocytes: 0.01 10*3/uL (ref 0.00–0.07)
Basophils Absolute: 0 10*3/uL (ref 0.0–0.1)
Basophils Relative: 1 %
Eosinophils Absolute: 0.1 10*3/uL (ref 0.0–0.5)
Eosinophils Relative: 1 %
HCT: 36.3 % (ref 36.0–46.0)
Hemoglobin: 11.7 g/dL — ABNORMAL LOW (ref 12.0–15.0)
Immature Granulocytes: 0 %
Lymphocytes Relative: 13 %
Lymphs Abs: 0.5 10*3/uL — ABNORMAL LOW (ref 0.7–4.0)
MCH: 30.4 pg (ref 26.0–34.0)
MCHC: 32.2 g/dL (ref 30.0–36.0)
MCV: 94.3 fL (ref 80.0–100.0)
Monocytes Absolute: 0.5 10*3/uL (ref 0.1–1.0)
Monocytes Relative: 12 %
Neutro Abs: 2.8 10*3/uL (ref 1.7–7.7)
Neutrophils Relative %: 73 %
Platelet Count: 282 10*3/uL (ref 150–400)
RBC: 3.85 MIL/uL — ABNORMAL LOW (ref 3.87–5.11)
RDW: 16.6 % — ABNORMAL HIGH (ref 11.5–15.5)
WBC Count: 3.8 10*3/uL — ABNORMAL LOW (ref 4.0–10.5)
nRBC: 0 % (ref 0.0–0.2)

## 2019-08-10 MED ORDER — DENOSUMAB 120 MG/1.7ML ~~LOC~~ SOLN
SUBCUTANEOUS | Status: AC
  Filled 2019-08-10: qty 1.7

## 2019-08-10 MED ORDER — DENOSUMAB 120 MG/1.7ML ~~LOC~~ SOLN: 120.0000 mg | Freq: Once | SUBCUTANEOUS | Status: DC

## 2019-08-10 NOTE — Patient Instructions (Signed)
Denosumab injection What is this medicine? DENOSUMAB (den oh sue mab) slows bone breakdown. Prolia is used to treat osteoporosis in women after menopause and in men, and in people who are taking corticosteroids for 6 months or more. Xgeva is used to treat a high calcium level due to cancer and to prevent bone fractures and other bone problems caused by multiple myeloma or cancer bone metastases. Xgeva is also used to treat giant cell tumor of the bone. This medicine may be used for other purposes; ask your health care provider or pharmacist if you have questions. COMMON BRAND NAME(S): Prolia, XGEVA What should I tell my health care provider before I take this medicine? They need to know if you have any of these conditions:  dental disease  having surgery or tooth extraction  infection  kidney disease  low levels of calcium or Vitamin D in the blood  malnutrition  on hemodialysis  skin conditions or sensitivity  thyroid or parathyroid disease  an unusual reaction to denosumab, other medicines, foods, dyes, or preservatives  pregnant or trying to get pregnant  breast-feeding How should I use this medicine? This medicine is for injection under the skin. It is given by a health care professional in a hospital or clinic setting. A special MedGuide will be given to you before each treatment. Be sure to read this information carefully each time. For Prolia, talk to your pediatrician regarding the use of this medicine in children. Special care may be needed. For Xgeva, talk to your pediatrician regarding the use of this medicine in children. While this drug may be prescribed for children as young as 13 years for selected conditions, precautions do apply. Overdosage: If you think you have taken too much of this medicine contact a poison control center or emergency room at once. NOTE: This medicine is only for you. Do not share this medicine with others. What if I miss a dose? It is  important not to miss your dose. Call your doctor or health care professional if you are unable to keep an appointment. What may interact with this medicine? Do not take this medicine with any of the following medications:  other medicines containing denosumab This medicine may also interact with the following medications:  medicines that lower your chance of fighting infection  steroid medicines like prednisone or cortisone This list may not describe all possible interactions. Give your health care provider a list of all the medicines, herbs, non-prescription drugs, or dietary supplements you use. Also tell them if you smoke, drink alcohol, or use illegal drugs. Some items may interact with your medicine. What should I watch for while using this medicine? Visit your doctor or health care professional for regular checks on your progress. Your doctor or health care professional may order blood tests and other tests to see how you are doing. Call your doctor or health care professional for advice if you get a fever, chills or sore throat, or other symptoms of a cold or flu. Do not treat yourself. This drug may decrease your body's ability to fight infection. Try to avoid being around people who are sick. You should make sure you get enough calcium and vitamin D while you are taking this medicine, unless your doctor tells you not to. Discuss the foods you eat and the vitamins you take with your health care professional. See your dentist regularly. Brush and floss your teeth as directed. Before you have any dental work done, tell your dentist you are   receiving this medicine. Do not become pregnant while taking this medicine or for 5 months after stopping it. Talk with your doctor or health care professional about your birth control options while taking this medicine. Women should inform their doctor if they wish to become pregnant or think they might be pregnant. There is a potential for serious side  effects to an unborn child. Talk to your health care professional or pharmacist for more information. What side effects may I notice from receiving this medicine? Side effects that you should report to your doctor or health care professional as soon as possible:  allergic reactions like skin rash, itching or hives, swelling of the face, lips, or tongue  bone pain  breathing problems  dizziness  jaw pain, especially after dental work  redness, blistering, peeling of the skin  signs and symptoms of infection like fever or chills; cough; sore throat; pain or trouble passing urine  signs of low calcium like fast heartbeat, muscle cramps or muscle pain; pain, tingling, numbness in the hands or feet; seizures  unusual bleeding or bruising  unusually weak or tired Side effects that usually do not require medical attention (report to your doctor or health care professional if they continue or are bothersome):  constipation  diarrhea  headache  joint pain  loss of appetite  muscle pain  runny nose  tiredness  upset stomach This list may not describe all possible side effects. Call your doctor for medical advice about side effects. You may report side effects to FDA at 1-800-FDA-1088. Where should I keep my medicine? This medicine is only given in a clinic, doctor's office, or other health care setting and will not be stored at home. NOTE: This sheet is a summary. It may not cover all possible information. If you have questions about this medicine, talk to your doctor, pharmacist, or health care provider.  2020 Elsevier/Gold Standard (2017-05-10 16:10:44)

## 2019-08-10 NOTE — Progress Notes (Unsigned)
Patient was not aware of add on injection appt today after Dr. Visit. Called Patient and rescheduled for wed. [place] voiced understanding

## 2019-08-10 NOTE — Telephone Encounter (Signed)
Patient needed injection appt today

## 2019-08-10 NOTE — Progress Notes (Signed)
[facility] [phone]   [phone]  OFFICE PROGRESS NOTE  [name], MD [place] [address]  DIAGNOSIS:  1) Stage IV (T2a, N1, M1b) non-small cell lung cancer, adenocarcinoma with positive EGFR mutation (deletion 48) presented with right middle lobe lung mass, right hilar adenopathy as well as metastatic disease to the bone, brain and left adrenal diagnosed in July 2016. 2) deep venous thrombosis of the left lower extremity diagnosed in March 2020  PRIOR THERAPY: 1) palliative radiotherapy to the brain as well as metastatic bone lesions in the pelvis. 2) Tarceva 150 mg by mouth daily started on 09/25/2014. Status post 5 months of treatment. 3) status post palliative radiotherapy to the right middle lobe lung mass for treatment of hemoptysis. 4) Tarceva 100 mg by mouth daily started 02/19/2015 status post 13 months of treatment. This was discontinued in May 2018 after the patient had disease progression and development of T790M resistant mutation.  5) palliative stereotactic radiotherapy to the left adrenal gland metastatic lesion under the care of Dr. Lisbeth Renshaw completed on April 10, 2017.  CURRENT THERAPY: 1) Tagrisso 80 mg by mouth daily started 06/02/2016.  Status post 38 months of treatment. 2) Xgeva 120 g subcutaneously every 6 weeks. 3) Xalkori 250 mg p.o. twice daily started 05/08/2018.  This was a change it to once daily in July 2020 secondary intolerant intolerance.  It is currently on hold because of intolerance.  INTERVAL HISTORY: Deborah Foster 49 y.o. female returns to the clinic today for follow-up visit accompanied by her [place] interpreter.  The patient is feeling fine today with no concerning complaints except for intermittent abdominal pain but no nausea, vomiting, diarrhea or constipation.  She denied having any current chest pain, shortness of breath, cough or hemoptysis.  She is good but not  gaining much weight.  She denied having any fever or chills.  She has no headache or visual changes.  She is here today for evaluation and repeat blood work.  She has been off treatment with Xalkori for the last 6 weeks.   MEDICAL HISTORY: Past Medical History:  Diagnosis Date  . Bone metastasis ([place])   . Hemoptysis   . Hypokalemia   . lung ca dx'd 07/2014  . Lung mass   . Metastasis to adrenal gland ([place])   . Metastasis to brain ([place])   . Pneumonia   . Radiation 08/23/14-09/07/14   Brain/chest and left hip 30 Gy 12 Fx  . URI (upper respiratory infection) 02-02-2015    ALLERGIES:  is allergic to other and doxycycline.  MEDICATIONS:  Current Outpatient Medications  Medication Sig Dispense Refill  . AMBULATORY NON FORMULARY MEDICATION Medication Name: MAGIC MOUTHWASH 2 % Viscous Lidocaine  Maalox Benadryl Susp.  Disp: 1:1:1 234m bottle Sig: 171mPO Swish & Spit  q 3-4hrs. PRN mouth sores 200 mL 3  . amoxicillin-clavulanate (AUGMENTIN) 875-125 MG tablet Take 1 tablet by mouth 2 (two) times daily. 14 tablet 0  . azithromycin (ZITHROMAX) 250 MG tablet Take 250 mg by mouth as directed.    . Calcium Carbonate-Vitamin D (CALCIUM 500 + D) 500-125 MG-UNIT TABS Take 500 mg by mouth 2 (two) times daily.    . clobetasol cream (TEMOVATE) 0.0.16 Apply 1 application topically 2 (two) times daily.    . Cyanocobalamin (VITAMIN B-12) 2500 MCG SUBL Place 2,500 mcg under the tongue daily.    . Marland Kitchenextromethorphan Polistirex (DELSYM PO) Take by mouth.    .Marland Kitchen  ENSURE (ENSURE) Take 1 Can by mouth 2 (two) times daily between meals.     . ferrous sulfate 325 (65 FE) MG EC tablet Take 325 mg by mouth daily with breakfast.    . fluticasone (VERAMYST) 27.5 MCG/SPRAY nasal spray Place 2 sprays into the nose daily.    . folic acid (FOLVITE) 951 MCG tablet Take 1 tablet (800 mcg total) by mouth daily. 30 tablet 1  . guaiFENesin-codeine (ROBITUSSIN AC) 100-10 MG/5ML syrup Take 5 mLs by mouth 3 (three) times daily as  needed for cough. 120 mL 0  . levETIRAcetam (KEPPRA) 250 MG tablet Take 1 tablet (250 mg total) by mouth 2 (two) times daily. 60 tablet 5  . loratadine (CLARITIN) 10 MG tablet Take 1 tablet (10 mg total) by mouth daily. 30 tablet 0  . mirtazapine (REMERON) 15 MG tablet Take 1 tablet (15 mg total) by mouth at bedtime. 30 tablet 0  . Nutritional Supplements (JUICE PLUS FIBRE PO) Take 1 capsule by mouth daily. Reported on 07/28/2015    . ondansetron (ZOFRAN) 8 MG tablet Take 8 mg by mouth every 8 (eight) hours as needed.    Marland Kitchen OVER THE COUNTER MEDICATION "Deep penetrating pain relief oil"    . oxyCODONE-acetaminophen (PERCOCET/ROXICET) 5-325 MG tablet Take 1 tablet by mouth every 6 (six) hours as needed for severe pain. 60 tablet 0  . oxyCODONE-acetaminophen (PERCOCET/ROXICET) 5-325 MG tablet     . prochlorperazine (COMPAZINE) 10 MG tablet TAKE 1 TABLET BY MOUTH EVERY 6 HOURS AS NEEDED FOR NAUSEA OR VOMITING. 30 tablet 0  . ranitidine (ZANTAC) 300 MG tablet Take 1 tablet (300 mg total) by mouth at bedtime. 30 tablet 6  . rivaroxaban (XARELTO) 20 MG TABS tablet TAKE 1 TABLET (20 MG TOTAL) BY MOUTH DAILY WITH SUPPER. 30 tablet 1  . TAGRISSO 80 MG tablet TAKE 1 TABLET (80 MG TOTAL) BY MOUTH DAILY. 30 tablet 11  . TURMERIC PO Take 1 capsule by mouth 2 (two) times daily. Turmeric Superior 750 mg po bid    . XALKORI 250 MG capsule TAKE 1 CAPSULE BY MOUTH 2 TIMES DAILY. 60 capsule 2   No current facility-administered medications for this visit.    SURGICAL HISTORY:  Past Surgical History:  Procedure Laterality Date  . IR GENERIC HISTORICAL  07/14/2015   IR RADIOLOGIST EVAL & MGMT 07/14/2015 Greggory Keen, MD [facility] INTERV RAD  . VIDEO BRONCHOSCOPY Bilateral 08/09/2014   Procedure: VIDEO BRONCHOSCOPY WITHOUT FLUORO;  Surgeon: [name], MD;  Location: [facility] ENDOSCOPY;  Service: Cardiopulmonary;  Laterality: Bilateral;  . VIDEO BRONCHOSCOPY Bilateral 07/06/2015   Procedure: VIDEO BRONCHOSCOPY WITHOUT  FLUORO;  Surgeon: [name], MD;  Location: [facility] ENDOSCOPY;  Service: Cardiopulmonary;  Laterality: Bilateral;    REVIEW OF SYSTEMS:  A comprehensive review of systems was negative except for: Constitutional: positive for fatigue and weight loss Gastrointestinal: positive for abdominal pain   PHYSICAL EXAMINATION: General appearance: alert, cooperative, fatigued and no distress Head: Normocephalic, without obvious abnormality, atraumatic Neck: no adenopathy, no JVD, supple, symmetrical, trachea midline and thyroid not enlarged, symmetric, no tenderness/mass/nodules Lymph nodes: Cervical, supraclavicular, and axillary nodes normal. Resp: clear to auscultation bilaterally Back: symmetric, no curvature. ROM normal. No CVA tenderness. Cardio: regular rate and rhythm, S1, S2 normal, no murmur, click, rub or gallop GI: soft, non-tender; bowel sounds normal; no masses,  no organomegaly Extremities: extremities normal, atraumatic, no cyanosis or edema  ECOG PERFORMANCE STATUS: 1 - Symptomatic but completely ambulatory  Blood pressure Marland Kitchen)  95/26, pulse (!) 110, temperature (!) 97.3 F (36.3 C), temperature source Temporal, resp. rate 20, height _0  (1.499 m), weight (!) 83 lb 11.2 oz (38 kg), SpO2 100 %.  LABORATORY DATA: Lab Results  Component Value Date   WBC 3.8 (L) 08/10/2019   HGB 11.7 (L) 08/10/2019   HCT 36.3 08/10/2019   MCV 94.3 08/10/2019   PLT 282 08/10/2019      Chemistry      Component Value Date/Time   NA 139 08/03/2019 1140   NA 140 01/17/2017 1003   K 4.5 08/03/2019 1140   K 3.9 01/17/2017 1003   CL 103 08/03/2019 1140   CO2 28 08/03/2019 1140   CO2 29 01/17/2017 1003   BUN 18 08/03/2019 1140   BUN 14.8 01/17/2017 1003   CREATININE 0.71 08/03/2019 1140   CREATININE 0.8 01/17/2017 1003      Component Value Date/Time   CALCIUM 8.9 08/03/2019 1140   CALCIUM 8.7 01/17/2017 1003   ALKPHOS 93 08/03/2019 1140   ALKPHOS 50 01/17/2017 1003   AST 54 (H)  08/03/2019 1140   AST 23 01/17/2017 1003   ALT 83 (H) 08/03/2019 1140   ALT 27 01/17/2017 1003   BILITOT 0.3 08/03/2019 1140   BILITOT 0.46 01/17/2017 1003       RADIOGRAPHIC STUDIES: DG Chest 2 View  Result Date: 08/03/2019 CLINICAL DATA:  Metastatic lung cancer.  Chest pain. EXAM: CHEST - 2 VIEW COMPARISON:  06/30/2019 FINDINGS: Volume loss with pleuroparenchymal opacity in the right lung is again noted, with progressive right suprahilar focal airspace opacity. Left lung clear. The cardiopericardial silhouette is within normal limits for size. The visualized bony structures of the thorax show now acute abnormality. IMPRESSION: Interval progression of focal airspace disease in the right suprahilar lung. Otherwise no substantial change. Electronically Signed   By: Misty Stanley M.D.   On: 08/03/2019 11:38    ASSESSMENT AND PLAN:  This is a very pleasant 49 years old Asian female with stage IV non-small cell lung cancer, adenocarcinoma with positive EGFR mutation with deletion in exon 19 diagnosed in July 2016 status post treatment with Tarceva for a total of 18 months but this was discontinued secondary to disease progression and development of T790M resistant mutation. The patient was started on treatment with Tagrisso 80 mg by mouth daily status post 38 months of treatment. She was also found to have MET amplification on repeat molecular studies. In addition to [place], the patient was a started on Xalkori 250 mg p.o. twice daily.  She has some side effect of this treatment with increasing fatigue and weakness as well as nausea and vomiting.  Hulda Humphrey was a change it to 250 once daily at nighttime.  She could not tolerate her treatment with Hulda Humphrey and it is currently on hold for the last 6 weeks. I recommended for the patient to continue her current treatment with single agent Tagrisso for now. I will see her back for follow-up visit in 2 months for evaluation with repeat CT scan of the  chest, abdomen pelvis for restaging of her disease. For the metastatic bone disease she will continue her current treatment with Xgeva every 6 weeks. The patient was advised to call immediately if she has any concerning symptoms in the interval. The patient voices understanding of current disease status and treatment options and is in agreement with the current care plan. All questions were answered. The patient knows to call the clinic with any problems, questions or concerns.  We can certainly see the patient much sooner if necessary.  Disclaimer: This note was dictated with voice recognition software. Similar sounding words can inadvertently be transcribed and may not be corrected upon review.

## 2019-08-11 ENCOUNTER — Telehealth: Payer: Self-pay | Admitting: Internal Medicine

## 2019-08-11 ENCOUNTER — Ambulatory Visit: Payer: Medicare Other | Admitting: Internal Medicine

## 2019-08-11 NOTE — Telephone Encounter (Signed)
Scheduled appt per 7/26 los - pt to get an updated schedule next visit.

## 2019-08-12 ENCOUNTER — Other Ambulatory Visit: Payer: Medicare Other

## 2019-08-12 ENCOUNTER — Ambulatory Visit: Payer: Medicare Other

## 2019-08-12 ENCOUNTER — Inpatient Hospital Stay: Payer: Medicare Other

## 2019-08-12 ENCOUNTER — Other Ambulatory Visit: Payer: Self-pay

## 2019-08-12 VITALS — BP 99/66 | HR 102 | Temp 98.2°F | Resp 18

## 2019-08-12 DIAGNOSIS — C3431 Malignant neoplasm of lower lobe, right bronchus or lung: Secondary | ICD-10-CM

## 2019-08-12 DIAGNOSIS — C342 Malignant neoplasm of middle lobe, bronchus or lung: Secondary | ICD-10-CM | POA: Diagnosis not present

## 2019-08-12 DIAGNOSIS — C3491 Malignant neoplasm of unspecified part of right bronchus or lung: Secondary | ICD-10-CM

## 2019-08-12 DIAGNOSIS — M898X9 Other specified disorders of bone, unspecified site: Secondary | ICD-10-CM

## 2019-08-12 DIAGNOSIS — C7951 Secondary malignant neoplasm of bone: Secondary | ICD-10-CM

## 2019-08-12 MED ORDER — DENOSUMAB 120 MG/1.7ML ~~LOC~~ SOLN
SUBCUTANEOUS | Status: AC
  Filled 2019-08-12: qty 1.7

## 2019-08-12 MED ORDER — DENOSUMAB 120 MG/1.7ML ~~LOC~~ SOLN
120.0000 mg | Freq: Once | SUBCUTANEOUS | Status: AC
  Administered 2019-08-12: 120 mg via SUBCUTANEOUS

## 2019-08-12 NOTE — Patient Instructions (Signed)
Denosumab injection What is this medicine? DENOSUMAB (den oh sue mab) slows bone breakdown. Prolia is used to treat osteoporosis in women after menopause and in men, and in people who are taking corticosteroids for 6 months or more. Xgeva is used to treat a high calcium level due to cancer and to prevent bone fractures and other bone problems caused by multiple myeloma or cancer bone metastases. Xgeva is also used to treat giant cell tumor of the bone. This medicine may be used for other purposes; ask your health care provider or pharmacist if you have questions. COMMON BRAND NAME(S): Prolia, XGEVA What should I tell my health care provider before I take this medicine? They need to know if you have any of these conditions:  dental disease  having surgery or tooth extraction  infection  kidney disease  low levels of calcium or Vitamin D in the blood  malnutrition  on hemodialysis  skin conditions or sensitivity  thyroid or parathyroid disease  an unusual reaction to denosumab, other medicines, foods, dyes, or preservatives  pregnant or trying to get pregnant  breast-feeding How should I use this medicine? This medicine is for injection under the skin. It is given by a health care professional in a hospital or clinic setting. A special MedGuide will be given to you before each treatment. Be sure to read this information carefully each time. For Prolia, talk to your pediatrician regarding the use of this medicine in children. Special care may be needed. For Xgeva, talk to your pediatrician regarding the use of this medicine in children. While this drug may be prescribed for children as young as 13 years for selected conditions, precautions do apply. Overdosage: If you think you have taken too much of this medicine contact a poison control center or emergency room at once. NOTE: This medicine is only for you. Do not share this medicine with others. What if I miss a dose? It is  important not to miss your dose. Call your doctor or health care professional if you are unable to keep an appointment. What may interact with this medicine? Do not take this medicine with any of the following medications:  other medicines containing denosumab This medicine may also interact with the following medications:  medicines that lower your chance of fighting infection  steroid medicines like prednisone or cortisone This list may not describe all possible interactions. Give your health care provider a list of all the medicines, herbs, non-prescription drugs, or dietary supplements you use. Also tell them if you smoke, drink alcohol, or use illegal drugs. Some items may interact with your medicine. What should I watch for while using this medicine? Visit your doctor or health care professional for regular checks on your progress. Your doctor or health care professional may order blood tests and other tests to see how you are doing. Call your doctor or health care professional for advice if you get a fever, chills or sore throat, or other symptoms of a cold or flu. Do not treat yourself. This drug may decrease your body's ability to fight infection. Try to avoid being around people who are sick. You should make sure you get enough calcium and vitamin D while you are taking this medicine, unless your doctor tells you not to. Discuss the foods you eat and the vitamins you take with your health care professional. See your dentist regularly. Brush and floss your teeth as directed. Before you have any dental work done, tell your dentist you are   receiving this medicine. Do not become pregnant while taking this medicine or for 5 months after stopping it. Talk with your doctor or health care professional about your birth control options while taking this medicine. Women should inform their doctor if they wish to become pregnant or think they might be pregnant. There is a potential for serious side  effects to an unborn child. Talk to your health care professional or pharmacist for more information. What side effects may I notice from receiving this medicine? Side effects that you should report to your doctor or health care professional as soon as possible:  allergic reactions like skin rash, itching or hives, swelling of the face, lips, or tongue  bone pain  breathing problems  dizziness  jaw pain, especially after dental work  redness, blistering, peeling of the skin  signs and symptoms of infection like fever or chills; cough; sore throat; pain or trouble passing urine  signs of low calcium like fast heartbeat, muscle cramps or muscle pain; pain, tingling, numbness in the hands or feet; seizures  unusual bleeding or bruising  unusually weak or tired Side effects that usually do not require medical attention (report to your doctor or health care professional if they continue or are bothersome):  constipation  diarrhea  headache  joint pain  loss of appetite  muscle pain  runny nose  tiredness  upset stomach This list may not describe all possible side effects. Call your doctor for medical advice about side effects. You may report side effects to FDA at 1-800-FDA-1088. Where should I keep my medicine? This medicine is only given in a clinic, doctor's office, or other health care setting and will not be stored at home. NOTE: This sheet is a summary. It may not cover all possible information. If you have questions about this medicine, talk to your doctor, pharmacist, or health care provider.  2020 Elsevier/Gold Standard (2017-05-10 16:10:44)

## 2019-08-13 ENCOUNTER — Other Ambulatory Visit: Payer: Self-pay | Admitting: Radiation Therapy

## 2019-08-19 MED FILL — TAGRISSO 80 MG TABLET: 80 | 30 days supply | Qty: 30 | Fill #4

## 2019-08-21 NOTE — Progress Notes (Signed)
  Radiation Oncology         [phone] ________________________________  Name: Deborah Foster MRN: [id number]  Date: 05/29/2019  DOB: 03/03/1970  End of Treatment Note  Diagnosis:   Stage IV NSCLC    Indication for treatment::  curative       Radiation treatment dates:   05/19/19 - 05/29/19  Site/dose:    1.  The patient was treated to the right lung with a course of stereotactic body radiation treatment.  The patient received 60 Gray in 5 fractions using a SBRT/ VMAT technique, with 3 fields.  2.  The patient was treated to the right adrenal gland met with a course of stereotactic body radiation treatment.  The patient received 60 Gray in 5 fractions using a SBRT/ VMAT technique, with 4 fields.  Narrative: The patient tolerated radiation treatment relatively well.   No unexpected difficulties.  The patient's breathing did not significantly change during the course of the treatment.  Plan: The patient has completed radiation treatment. The patient will return to radiation oncology clinic for routine followup in one month. I advised the patient to call or return sooner if they have any questions or concerns related to their recovery or treatment. ________________________________  Jodelle Gross, M.D., Ph.D.

## 2019-09-02 MED FILL — levETIRAcetam 250 MG TABS: 250 | 30 days supply | Qty: 60 | Fill #2

## 2019-09-15 MED FILL — TAGRISSO 80 MG TABLET: 80 | 30 days supply | Qty: 30 | Fill #5

## 2019-09-23 ENCOUNTER — Other Ambulatory Visit: Payer: Self-pay

## 2019-09-23 ENCOUNTER — Inpatient Hospital Stay: Payer: Medicare Other | Attending: Internal Medicine

## 2019-09-23 ENCOUNTER — Other Ambulatory Visit: Payer: Self-pay | Admitting: Internal Medicine

## 2019-09-23 ENCOUNTER — Inpatient Hospital Stay: Payer: Medicare Other

## 2019-09-23 VITALS — BP 100/60 | HR 109 | Resp 18

## 2019-09-23 DIAGNOSIS — M25552 Pain in left hip: Secondary | ICD-10-CM | POA: Insufficient documentation

## 2019-09-23 DIAGNOSIS — Z7901 Long term (current) use of anticoagulants: Secondary | ICD-10-CM | POA: Insufficient documentation

## 2019-09-23 DIAGNOSIS — C7951 Secondary malignant neoplasm of bone: Secondary | ICD-10-CM | POA: Diagnosis not present

## 2019-09-23 DIAGNOSIS — C3431 Malignant neoplasm of lower lobe, right bronchus or lung: Secondary | ICD-10-CM

## 2019-09-23 DIAGNOSIS — C7931 Secondary malignant neoplasm of brain: Secondary | ICD-10-CM | POA: Diagnosis not present

## 2019-09-23 DIAGNOSIS — M898X9 Other specified disorders of bone, unspecified site: Secondary | ICD-10-CM

## 2019-09-23 DIAGNOSIS — C3491 Malignant neoplasm of unspecified part of right bronchus or lung: Secondary | ICD-10-CM

## 2019-09-23 DIAGNOSIS — C7972 Secondary malignant neoplasm of left adrenal gland: Secondary | ICD-10-CM | POA: Diagnosis not present

## 2019-09-23 DIAGNOSIS — Z79899 Other long term (current) drug therapy: Secondary | ICD-10-CM | POA: Diagnosis not present

## 2019-09-23 DIAGNOSIS — C342 Malignant neoplasm of middle lobe, bronchus or lung: Secondary | ICD-10-CM | POA: Insufficient documentation

## 2019-09-23 DIAGNOSIS — R042 Hemoptysis: Secondary | ICD-10-CM | POA: Insufficient documentation

## 2019-09-23 LAB — BASIC METABOLIC PANEL - CANCER CENTER ONLY
Anion gap: 7 (ref 5–15)
BUN: 16 mg/dL (ref 6–20)
CO2: 29 mmol/L (ref 22–32)
Calcium: 9.4 mg/dL (ref 8.9–10.3)
Chloride: 98 mmol/L (ref 98–111)
Creatinine: 0.71 mg/dL (ref 0.44–1.00)
GFR, Est AFR Am: >60 mL/min (ref >60)
GFR, Estimated: >60 mL/min (ref >60)
Glucose, Bld: 112 mg/dL — ABNORMAL HIGH (ref 70–99)
Potassium: 4.7 mmol/L (ref 3.5–5.1)
Sodium: 134 mmol/L — ABNORMAL LOW (ref 135–145)

## 2019-09-23 MED ORDER — DENOSUMAB 120 MG/1.7ML ~~LOC~~ SOLN
120.0000 mg | Freq: Once | SUBCUTANEOUS | Status: AC
  Administered 2019-09-23: 120 mg via SUBCUTANEOUS

## 2019-09-23 MED ORDER — DENOSUMAB 120 MG/1.7ML ~~LOC~~ SOLN
SUBCUTANEOUS | Status: AC
  Filled 2019-09-23: qty 1.7

## 2019-09-23 NOTE — Patient Instructions (Signed)
Denosumab injection What is this medicine? DENOSUMAB (den oh sue mab) slows bone breakdown. Prolia is used to treat osteoporosis in women after menopause and in men, and in people who are taking corticosteroids for 6 months or more. Xgeva is used to treat a high calcium level due to cancer and to prevent bone fractures and other bone problems caused by multiple myeloma or cancer bone metastases. Xgeva is also used to treat giant cell tumor of the bone. This medicine may be used for other purposes; ask your health care provider or pharmacist if you have questions. COMMON BRAND NAME(S): Prolia, XGEVA What should I tell my health care provider before I take this medicine? They need to know if you have any of these conditions:  dental disease  having surgery or tooth extraction  infection  kidney disease  low levels of calcium or Vitamin D in the blood  malnutrition  on hemodialysis  skin conditions or sensitivity  thyroid or parathyroid disease  an unusual reaction to denosumab, other medicines, foods, dyes, or preservatives  pregnant or trying to get pregnant  breast-feeding How should I use this medicine? This medicine is for injection under the skin. It is given by a health care professional in a hospital or clinic setting. A special MedGuide will be given to you before each treatment. Be sure to read this information carefully each time. For Prolia, talk to your pediatrician regarding the use of this medicine in children. Special care may be needed. For Xgeva, talk to your pediatrician regarding the use of this medicine in children. While this drug may be prescribed for children as young as 13 years for selected conditions, precautions do apply. Overdosage: If you think you have taken too much of this medicine contact a poison control center or emergency room at once. NOTE: This medicine is only for you. Do not share this medicine with others. What if I miss a dose? It is  important not to miss your dose. Call your doctor or health care professional if you are unable to keep an appointment. What may interact with this medicine? Do not take this medicine with any of the following medications:  other medicines containing denosumab This medicine may also interact with the following medications:  medicines that lower your chance of fighting infection  steroid medicines like prednisone or cortisone This list may not describe all possible interactions. Give your health care provider a list of all the medicines, herbs, non-prescription drugs, or dietary supplements you use. Also tell them if you smoke, drink alcohol, or use illegal drugs. Some items may interact with your medicine. What should I watch for while using this medicine? Visit your doctor or health care professional for regular checks on your progress. Your doctor or health care professional may order blood tests and other tests to see how you are doing. Call your doctor or health care professional for advice if you get a fever, chills or sore throat, or other symptoms of a cold or flu. Do not treat yourself. This drug may decrease your body's ability to fight infection. Try to avoid being around people who are sick. You should make sure you get enough calcium and vitamin D while you are taking this medicine, unless your doctor tells you not to. Discuss the foods you eat and the vitamins you take with your health care professional. See your dentist regularly. Brush and floss your teeth as directed. Before you have any dental work done, tell your dentist you are   receiving this medicine. Do not become pregnant while taking this medicine or for 5 months after stopping it. Talk with your doctor or health care professional about your birth control options while taking this medicine. Women should inform their doctor if they wish to become pregnant or think they might be pregnant. There is a potential for serious side  effects to an unborn child. Talk to your health care professional or pharmacist for more information. What side effects may I notice from receiving this medicine? Side effects that you should report to your doctor or health care professional as soon as possible:  allergic reactions like skin rash, itching or hives, swelling of the face, lips, or tongue  bone pain  breathing problems  dizziness  jaw pain, especially after dental work  redness, blistering, peeling of the skin  signs and symptoms of infection like fever or chills; cough; sore throat; pain or trouble passing urine  signs of low calcium like fast heartbeat, muscle cramps or muscle pain; pain, tingling, numbness in the hands or feet; seizures  unusual bleeding or bruising  unusually weak or tired Side effects that usually do not require medical attention (report to your doctor or health care professional if they continue or are bothersome):  constipation  diarrhea  headache  joint pain  loss of appetite  muscle pain  runny nose  tiredness  upset stomach This list may not describe all possible side effects. Call your doctor for medical advice about side effects. You may report side effects to FDA at 1-800-FDA-1088. Where should I keep my medicine? This medicine is only given in a clinic, doctor's office, or other health care setting and will not be stored at home. NOTE: This sheet is a summary. It may not cover all possible information. If you have questions about this medicine, talk to your doctor, pharmacist, or health care provider.  2020 Elsevier/Gold Standard (2017-05-10 16:10:44)

## 2019-09-30 MED FILL — levETIRAcetam 250 MG TABS: 250 | 30 days supply | Qty: 60 | Fill #3

## 2019-10-09 ENCOUNTER — Inpatient Hospital Stay: Payer: Medicare Other

## 2019-10-09 ENCOUNTER — Ambulatory Visit (HOSPITAL_COMMUNITY)
Admission: RE | Admit: 2019-10-09 | Discharge: 2019-10-09 | Disposition: A | Discharge status: Home / Self Care | Payer: Medicare Other | Source: Ambulatory Visit | Attending: Internal Medicine | Admitting: Internal Medicine

## 2019-10-09 ENCOUNTER — Other Ambulatory Visit: Payer: Self-pay

## 2019-10-09 DIAGNOSIS — C3490 Malignant neoplasm of unspecified part of unspecified bronchus or lung: Secondary | ICD-10-CM

## 2019-10-09 DIAGNOSIS — C342 Malignant neoplasm of middle lobe, bronchus or lung: Secondary | ICD-10-CM | POA: Diagnosis not present

## 2019-10-09 LAB — CBC WITH DIFFERENTIAL (CANCER CENTER ONLY)
Abs Immature Granulocytes: 0.01 10*3/uL (ref 0.00–0.07)
Basophils Absolute: 0 10*3/uL (ref 0.0–0.1)
Basophils Relative: 1 %
Eosinophils Absolute: 0.2 10*3/uL (ref 0.0–0.5)
Eosinophils Relative: 4 %
HCT: 36.6 % (ref 36.0–46.0)
Hemoglobin: 12.1 g/dL (ref 12.0–15.0)
Immature Granulocytes: 0 %
Lymphocytes Relative: 19 %
Lymphs Abs: 0.8 10*3/uL (ref 0.7–4.0)
MCH: 30.9 pg (ref 26.0–34.0)
MCHC: 33.1 g/dL (ref 30.0–36.0)
MCV: 93.6 fL (ref 80.0–100.0)
Monocytes Absolute: 0.6 10*3/uL (ref 0.1–1.0)
Monocytes Relative: 13 %
Neutro Abs: 2.8 10*3/uL (ref 1.7–7.7)
Neutrophils Relative %: 63 %
Platelet Count: 297 10*3/uL (ref 150–400)
RBC: 3.91 MIL/uL (ref 3.87–5.11)
RDW: 14 % (ref 11.5–15.5)
WBC Count: 4.4 10*3/uL (ref 4.0–10.5)
nRBC: 0 % (ref 0.0–0.2)

## 2019-10-09 LAB — CMP (CANCER CENTER ONLY)
ALT: 64 U/L — ABNORMAL HIGH (ref 0–44)
AST: 43 U/L — ABNORMAL HIGH (ref 15–41)
Albumin: 3.7 g/dL (ref 3.5–5.0)
Alkaline Phosphatase: 90 U/L (ref 38–126)
Anion gap: 6 (ref 5–15)
BUN: 15 mg/dL (ref 6–20)
CO2: 32 mmol/L (ref 22–32)
Calcium: 9.4 mg/dL (ref 8.9–10.3)
Chloride: 100 mmol/L (ref 98–111)
Creatinine: 0.74 mg/dL (ref 0.44–1.00)
GFR, Est AFR Am: >60 mL/min (ref >60)
GFR, Estimated: >60 mL/min (ref >60)
Glucose, Bld: 76 mg/dL (ref 70–99)
Potassium: 4.4 mmol/L (ref 3.5–5.1)
Sodium: 138 mmol/L (ref 135–145)
Total Bilirubin: 0.3 mg/dL (ref 0.3–1.2)
Total Protein: 8 g/dL (ref 6.5–8.1)

## 2019-10-09 MED ORDER — IOHEXOL 300 MG/ML  SOLN
100.0000 mL | Freq: Once | INTRAMUSCULAR | Status: AC | PRN
  Administered 2019-10-09: 80 mL via INTRAVENOUS

## 2019-10-12 ENCOUNTER — Encounter: Payer: Self-pay | Admitting: Internal Medicine

## 2019-10-12 ENCOUNTER — Other Ambulatory Visit: Payer: Self-pay

## 2019-10-12 ENCOUNTER — Inpatient Hospital Stay: Payer: Medicare Other | Admitting: Internal Medicine

## 2019-10-12 VITALS — BP 97/57 | HR 109 | Temp 98.5°F | Resp 16 | Ht 59.0 in | Wt 85.1 lb

## 2019-10-12 DIAGNOSIS — Z5111 Encounter for antineoplastic chemotherapy: Secondary | ICD-10-CM

## 2019-10-12 DIAGNOSIS — R059 Cough, unspecified: Secondary | ICD-10-CM

## 2019-10-12 DIAGNOSIS — R05 Cough: Secondary | ICD-10-CM

## 2019-10-12 DIAGNOSIS — C3431 Malignant neoplasm of lower lobe, right bronchus or lung: Secondary | ICD-10-CM

## 2019-10-12 DIAGNOSIS — C3490 Malignant neoplasm of unspecified part of unspecified bronchus or lung: Secondary | ICD-10-CM

## 2019-10-12 DIAGNOSIS — C7951 Secondary malignant neoplasm of bone: Secondary | ICD-10-CM

## 2019-10-12 DIAGNOSIS — E278 Other specified disorders of adrenal gland: Secondary | ICD-10-CM

## 2019-10-12 DIAGNOSIS — C3491 Malignant neoplasm of unspecified part of right bronchus or lung: Secondary | ICD-10-CM

## 2019-10-12 DIAGNOSIS — C7972 Secondary malignant neoplasm of left adrenal gland: Secondary | ICD-10-CM

## 2019-10-12 DIAGNOSIS — C342 Malignant neoplasm of middle lobe, bronchus or lung: Secondary | ICD-10-CM | POA: Diagnosis not present

## 2019-10-12 MED ORDER — GUAIFENESIN-CODEINE 100-10 MG/5ML PO SYRP: 5.0000 mL | ORAL_SOLUTION | Freq: Three times a day (TID) | ORAL | 0 refills | Status: DC | PRN

## 2019-10-12 MED FILL — GUAIATUSSIN AC LIQUID: 100-10 | 8 days supply | Qty: 120 | Fill #0

## 2019-10-12 NOTE — Progress Notes (Signed)
[place] [phone]   [phone]  OFFICE PROGRESS NOTE  [name], MD [place] [address]  DIAGNOSIS:  1) Stage IV (T2a, N1, M1b) non-small cell lung cancer, adenocarcinoma with positive EGFR mutation (deletion 49) presented with right middle lobe lung mass, right hilar adenopathy as well as metastatic disease to the bone, brain and left adrenal diagnosed in July 2016. 2) deep venous thrombosis of the left lower extremity diagnosed in March 2020  PRIOR THERAPY: 1) palliative radiotherapy to the brain as well as metastatic bone lesions in the pelvis. 2) Tarceva 150 mg by mouth daily started on 09/25/2014. Status post 5 months of treatment. 3) status post palliative radiotherapy to the right middle lobe lung mass for treatment of hemoptysis. 4) Tarceva 100 mg by mouth daily started 02/19/2015 status post 13 months of treatment. This was discontinued in May 2018 after the patient had disease progression and development of T790M resistant mutation.  5) palliative stereotactic radiotherapy to the left adrenal gland metastatic lesion under the care of Dr. Lisbeth Renshaw completed on April 10, 2017.  CURRENT THERAPY: 1) Tagrisso 80 mg by mouth daily started 06/02/2016.  Status post 40 months of treatment. 2) Xgeva 120 g subcutaneously every 6 weeks. 3) Xalkori 250 mg p.o. twice daily started 05/08/2018.  This was a change it to once daily in July 2020 secondary intolerant intolerance.  It is currently on hold because of intolerance.  INTERVAL HISTORY: Deborah Foster 49 y.o. female returns to the clinic today for follow-up visit accompanied by her [place] interpreter.  The patient is feeling fine today with no concerning complaints except for occasional left hip pain.  She denied having any chest pain, shortness of breath but has mild cough with occasional blood-tinged sputum and no frank hemoptysis.  She denied having any recent  weight loss or night sweats.  She has no nausea, vomiting, diarrhea or constipation.  She denied having any headache or visual changes.  She is currently off treatment of Xalkori and she is feeling much better.  She is tolerating her treatment with Tagrisso fairly well.  The patient had repeat CT scan of the chest, abdomen pelvis performed recently and she is here for evaluation and discussion of her scan results.  MEDICAL HISTORY: Past Medical History:  Diagnosis Date   Bone metastasis ([place])    Hemoptysis    Hypokalemia    lung ca dx'd 07/2014   Lung mass    Metastasis to adrenal gland ([facility])    Metastasis to brain [facility])    Pneumonia    Radiation 08/23/14-09/07/14   Brain/chest and left hip 30 Gy 12 Fx   URI (upper respiratory infection) 02-02-2015    ALLERGIES:  is allergic to other and doxycycline.  MEDICATIONS:  Current Outpatient Medications  Medication Sig Dispense Refill   AMBULATORY NON FORMULARY MEDICATION Medication Name: MAGIC MOUTHWASH 2 % Viscous Lidocaine  Maalox Benadryl Susp.  Disp: 1:1:1 228m bottle Sig: 160mPO Swish & Spit  q 3-4hrs. PRN mouth sores 200 mL 3   amoxicillin-clavulanate (AUGMENTIN) 875-125 MG tablet Take 1 tablet by mouth 2 (two) times daily. 14 tablet 0   azithromycin (ZITHROMAX) 250 MG tablet Take 250 mg by mouth as directed.     Calcium Carbonate-Vitamin D (CALCIUM 500 + D) 500-125 MG-UNIT TABS Take 500 mg by mouth 2 (two) times daily.     clobetasol cream (TEMOVATE) 0.3.38 Apply 1 application topically 2 (  two) times daily.     Cyanocobalamin (VITAMIN B-12) 2500 MCG SUBL Place 2,500 mcg under the tongue daily.     Dextromethorphan Polistirex (DELSYM PO) Take by mouth.     ENSURE (ENSURE) Take 1 Can by mouth 2 (two) times daily between meals.      ferrous sulfate 325 (65 FE) MG EC tablet Take 325 mg by mouth daily with breakfast.     fluticasone (VERAMYST) 27.5 MCG/SPRAY nasal spray Place 2 sprays into the nose daily.      folic acid (FOLVITE) 800 MCG tablet Take 1 tablet (800 mcg total) by mouth daily. 30 tablet 1   guaiFENesin-codeine (ROBITUSSIN AC) 100-10 MG/5ML syrup Take 5 mLs by mouth 3 (three) times daily as needed for cough. 120 mL 0   levETIRAcetam (KEPPRA) 250 MG tablet Take 1 tablet (250 mg total) by mouth 2 (two) times daily. 60 tablet 5   loratadine (CLARITIN) 10 MG tablet Take 1 tablet (10 mg total) by mouth daily. 30 tablet 0   mirtazapine (REMERON) 15 MG tablet Take 1 tablet (15 mg total) by mouth at bedtime. 30 tablet 0   Nutritional Supplements (JUICE PLUS FIBRE PO) Take 1 capsule by mouth daily. Reported on 07/28/2015     ondansetron (ZOFRAN) 8 MG tablet Take 8 mg by mouth every 8 (eight) hours as needed.     OVER THE COUNTER MEDICATION "Deep penetrating pain relief oil"     oxyCODONE-acetaminophen (PERCOCET/ROXICET) 5-325 MG tablet Take 1 tablet by mouth every 6 (six) hours as needed for severe pain. 60 tablet 0   oxyCODONE-acetaminophen (PERCOCET/ROXICET) 5-325 MG tablet      prochlorperazine (COMPAZINE) 10 MG tablet TAKE 1 TABLET BY MOUTH EVERY 6 HOURS AS NEEDED FOR NAUSEA OR VOMITING. 30 tablet 0   ranitidine (ZANTAC) 300 MG tablet Take 1 tablet (300 mg total) by mouth at bedtime. 30 tablet 6   rivaroxaban (XARELTO) 20 MG TABS tablet TAKE 1 TABLET (20 MG TOTAL) BY MOUTH DAILY WITH SUPPER. 30 tablet 1   TAGRISSO 80 MG tablet TAKE 1 TABLET (80 MG TOTAL) BY MOUTH DAILY. 30 tablet 11   TURMERIC PO Take 1 capsule by mouth 2 (two) times daily. Turmeric Superior 750 mg po bid     XALKORI 250 MG capsule TAKE 1 CAPSULE BY MOUTH 2 TIMES DAILY. 60 capsule 2   No current facility-administered medications for this visit.   Facility-Administered Medications Ordered in Other Visits  Medication Dose Route Frequency Provider Last Rate Last Admin   denosumab (XGEVA) injection 120 mg  120 mg Subcutaneous Once Si Gaul, MD        SURGICAL HISTORY:  Past Surgical History:  Procedure  Laterality Date   IR GENERIC HISTORICAL  07/14/2015   IR RADIOLOGIST EVAL & MGMT 07/14/2015 Berdine Dance, MD [facility] INTERV RAD   VIDEO BRONCHOSCOPY Bilateral 08/09/2014   Procedure: VIDEO BRONCHOSCOPY WITHOUT FLUORO;  Surgeon: Lupita Leash, MD;  Location: [facility] ENDOSCOPY;  Service: Cardiopulmonary;  Laterality: Bilateral;   VIDEO BRONCHOSCOPY Bilateral 07/06/2015   Procedure: VIDEO BRONCHOSCOPY WITHOUT FLUORO;  Surgeon: Lupita Leash, MD;  Location: [facility] ENDOSCOPY;  Service: Cardiopulmonary;  Laterality: Bilateral;    REVIEW OF SYSTEMS:  Constitutional: positive for fatigue Eyes: negative Ears, nose, mouth, throat, and face: negative Respiratory: positive for cough Cardiovascular: negative Gastrointestinal: negative Genitourinary:negative Integument/breast: negative Hematologic/lymphatic: negative Musculoskeletal:positive for bone pain Neurological: negative Behavioral/Psych: negative Endocrine: negative Allergic/Immunologic: negative   PHYSICAL EXAMINATION: General appearance: alert, cooperative, fatigued and no distress Head: Normocephalic,  without obvious abnormality, atraumatic Neck: no adenopathy, no JVD, supple, symmetrical, trachea midline and thyroid not enlarged, symmetric, no tenderness/mass/nodules Lymph nodes: Cervical, supraclavicular, and axillary nodes normal. Resp: clear to auscultation bilaterally Back: symmetric, no curvature. ROM normal. No CVA tenderness. Cardio: regular rate and rhythm, S1, S2 normal, no murmur, click, rub or gallop GI: soft, non-tender; bowel sounds normal; no masses,  no organomegaly Extremities: extremities normal, atraumatic, no cyanosis or edema Neurologic: Alert and oriented X 3, normal strength and tone. Normal symmetric reflexes. Normal coordination and gait  ECOG PERFORMANCE STATUS: 1 - Symptomatic but completely ambulatory  Blood pressure (!) 97/57, pulse (!) 109, temperature 98.5 F (36.9 C), temperature source Tympanic, resp.  rate 16, height $RemoveBe'4\' 11"'MNdUrqMon$  (1.499 m), weight 85 lb 1.6 oz (38.6 kg), SpO2 100 %.  LABORATORY DATA: Lab Results  Component Value Date   WBC 4.4 10/09/2019   HGB 12.1 10/09/2019   HCT 36.6 10/09/2019   MCV 93.6 10/09/2019   PLT 297 10/09/2019      Chemistry      Component Value Date/Time   NA 138 10/09/2019 0840   NA 140 01/17/2017 1003   K 4.4 10/09/2019 0840   K 3.9 01/17/2017 1003   CL 100 10/09/2019 0840   CO2 32 10/09/2019 0840   CO2 29 01/17/2017 1003   BUN 15 10/09/2019 0840   BUN 14.8 01/17/2017 1003   CREATININE 0.74 10/09/2019 0840   CREATININE 0.8 01/17/2017 1003      Component Value Date/Time   CALCIUM 9.4 10/09/2019 0840   CALCIUM 8.7 01/17/2017 1003   ALKPHOS 90 10/09/2019 0840   ALKPHOS 50 01/17/2017 1003   AST 43 (H) 10/09/2019 0840   AST 23 01/17/2017 1003   ALT 64 (H) 10/09/2019 0840   ALT 27 01/17/2017 1003   BILITOT 0.3 10/09/2019 0840   BILITOT 0.46 01/17/2017 1003       RADIOGRAPHIC STUDIES: CT Chest W Contrast  Result Date: 10/09/2019 CLINICAL DATA:  Primary Cancer Type: Lung Imaging Indication: Routine surveillance Interval therapy since last imaging? Yes Initial Cancer Diagnosis Date: 08/09/2014; Established by: Biopsy-proven Detailed Pathology: Stage IV non-small cell lung cancer, adenocarcinoma with positive EGFR mutation. Primary Tumor location: Right middle lobe. Metastatic disease to the bone, brain and left adrenal. Surgeries: No Chemotherapy: Yes; Ongoing?  Yes, daily Immunotherapy? No Other Cancer Therapies: Xgeva Radiation therapy? Yes Date Range: 05/19/2019 - 05/29/2019; Target: Right lung, right adrenal gland Date Range: 04/16/2018 - 04/29/2018; Target: Abdomen/retroperitoneal nodes, Left SCLV/mediastinum Date Range: 04/01/2017 - 04/10/2017; Target: Left adrenal gland Date Range: 07/21/2015 - 08/03/2015; Target: Right lung Date Range: 08/23/2014 - 09/07/2014; Target: Brain EXAM: CT CHEST, ABDOMEN, AND PELVIS WITH CONTRAST TECHNIQUE:  Multidetector CT imaging of the chest, abdomen and pelvis was performed following the standard protocol during bolus administration of intravenous contrast. CONTRAST:  92mL OMNIPAQUE IOHEXOL 300 MG/ML  SOLN COMPARISON:  Most recent CT abdomen and pelvis 07/10/2019. Most recent CT chest 07/02/2019. FINDINGS: CT CHEST FINDINGS Cardiovascular: Aortic caliber is normal. Heart size normal without pericardial effusion. No aneurysmal dilation of the thoracic aorta. Dilation of central pulmonary vasculature with similar appearance to previous chest CT not exceeding 3 cm. Mediastinum/Nodes: Thoracic inlet structures are normal. No axillary lymphadenopathy.  No mediastinal lymphadenopathy. RIGHT hilum in continuity with post radiation changes in the RIGHT lower lobe and RIGHT middle lobe. Bronchiectatic changes and volume loss remain associated with small amount of RIGHT-sided pleural fluid unchanged since previous imaging. Precarinal lymph node on image 23 of series  2 measuring 11 mm enlarged from prior imaging showing enhancement based on density values. No LEFT hilar adenopathy. Lungs/Pleura: Further consolidative change extending into RIGHT upper lobe from the hilum also involving RIGHT middle lobe with bronchiectasis and RIGHT lower lobe. Findings compatible with post radiation changes. LEFT chest is clear. Further consolidative changes in the RIGHT upper lobe at the site of Radi nodule in this location. Airways are patent. No new suspicious nodule. Musculoskeletal: No acute musculoskeletal process involving the bony thorax. Sclerosis of the medial RIGHT clavicle, RIGHT third rib and sternal manubrium and to a lesser extent the medial LEFT clavicle without change since previous imaging. CT ABDOMEN PELVIS FINDINGS Hepatobiliary: No focal, suspicious hepatic lesion. No pericholecystic stranding. Mild intrahepatic biliary duct distension with similar appearance and extrahepatic biliary duct distension as before. Pancreas:  No peripancreatic stranding, ductal dilation or sign of inflammation. Spleen: Pancreas normal without ductal dilation or sign of inflammation. Adrenals/Urinary Tract: LEFT adrenal gland measuring 1.1 x 0.9 cm previously 1.7 x 1.9 cm in June of 2021. RIGHT adrenal gland without clear sign of lesion on the current exam improved from the prior study. The kidneys enhance symmetrically. No hydronephrosis. Urinary bladder under distended limiting assessment. Stomach/Bowel: No acute gastrointestinal process. Appendix is normal. Vascular/Lymphatic: Atheromatous plaque of the abdominal aorta shows a similar appearance to previous imaging. There is no gastrohepatic or hepatoduodenal ligament lymphadenopathy. No retroperitoneal or mesenteric lymphadenopathy. No pelvic sidewall lymphadenopathy. Reproductive: Uterus and adnexa unremarkable by CT. Other: No ascites. Musculoskeletal: T3 sclerotic lesion similar to study from July 02, 2019. LEFT L1 sclerotic lesion also similar to the previous exam mixed lytic and sclerotic process in the LEFT iliac more diffuse lytic and sclerotic changes just above the LEFT acetabulum involving LEFT acetabulum and LEFT ischium with similar appearance. Scattered small sclerotic foci elsewhere including RIGHT pubic bone and RIGHT proximal femur without change since April of 2020 warm Sclerosis of RIGHT humeral head also similar. The other lesions outlined thoracic section without change. Redemonstration also LEFT femoral neck lesion with sclerosis and unchanged appearance. IMPRESSION: 1. Further consolidative changes in the RIGHT upper lobe at the site of previous RIGHT upper lobe nodule. More ill-defined consolidative process in the RIGHT upper lobe seen on the prior study is now more organized. Findings are compatible with post radiation changes. Attention on follow-up. 2. Precarinal lymph node measuring 11 mm enlarged from prior imaging showing enhancement based on density values. Based on  density finding is of moderate suspicion. Short interval follow-up could be considered for further assessment. Findings could also be reactive. 3. Stable post treatment changes in RIGHT middle and lower lobe. 4. Decreased size of LEFT adrenal metastasis. Resolution of thickening of the RIGHT adrenal that was seen on the prior exam. 5. Similar appearance of mixed lytic and sclerotic changes throughout the axial and appendicular skeleton as described. 6. Unchanged small RIGHT-sided pleural effusion. 7. Unchanged intrahepatic and extrahepatic biliary duct distension with similar appearance to previous imaging. Correlate with any clinical signs of cholangitis or prior cholangitis. 8. Aortic atherosclerosis. Aortic Atherosclerosis (ICD10-I70.0). Electronically Signed   By: Zetta Bills M.D.   On: 10/09/2019 11:20   CT Abdomen Pelvis W Contrast  Result Date: 10/09/2019 CLINICAL DATA:  Primary Cancer Type: Lung Imaging Indication: Routine surveillance Interval therapy since last imaging? Yes Initial Cancer Diagnosis Date: 08/09/2014; Established by: Biopsy-proven Detailed Pathology: Stage IV non-small cell lung cancer, adenocarcinoma with positive EGFR mutation. Primary Tumor location: Right middle lobe. Metastatic disease to the bone, brain and  left adrenal. Surgeries: No Chemotherapy: Yes; Ongoing?  Yes, daily Immunotherapy? No Other Cancer Therapies: Xgeva Radiation therapy? Yes Date Range: 05/19/2019 - 05/29/2019; Target: Right lung, right adrenal gland Date Range: 04/16/2018 - 04/29/2018; Target: Abdomen/retroperitoneal nodes, Left SCLV/mediastinum Date Range: 04/01/2017 - 04/10/2017; Target: Left adrenal gland Date Range: 07/21/2015 - 08/03/2015; Target: Right lung Date Range: 08/23/2014 - 09/07/2014; Target: Brain EXAM: CT CHEST, ABDOMEN, AND PELVIS WITH CONTRAST TECHNIQUE: Multidetector CT imaging of the chest, abdomen and pelvis was performed following the standard protocol during bolus administration of  intravenous contrast. CONTRAST:  74mL OMNIPAQUE IOHEXOL 300 MG/ML  SOLN COMPARISON:  Most recent CT abdomen and pelvis 07/10/2019. Most recent CT chest 07/02/2019. FINDINGS: CT CHEST FINDINGS Cardiovascular: Aortic caliber is normal. Heart size normal without pericardial effusion. No aneurysmal dilation of the thoracic aorta. Dilation of central pulmonary vasculature with similar appearance to previous chest CT not exceeding 3 cm. Mediastinum/Nodes: Thoracic inlet structures are normal. No axillary lymphadenopathy.  No mediastinal lymphadenopathy. RIGHT hilum in continuity with post radiation changes in the RIGHT lower lobe and RIGHT middle lobe. Bronchiectatic changes and volume loss remain associated with small amount of RIGHT-sided pleural fluid unchanged since previous imaging. Precarinal lymph node on image 23 of series 2 measuring 11 mm enlarged from prior imaging showing enhancement based on density values. No LEFT hilar adenopathy. Lungs/Pleura: Further consolidative change extending into RIGHT upper lobe from the hilum also involving RIGHT middle lobe with bronchiectasis and RIGHT lower lobe. Findings compatible with post radiation changes. LEFT chest is clear. Further consolidative changes in the RIGHT upper lobe at the site of Radi nodule in this location. Airways are patent. No new suspicious nodule. Musculoskeletal: No acute musculoskeletal process involving the bony thorax. Sclerosis of the medial RIGHT clavicle, RIGHT third rib and sternal manubrium and to a lesser extent the medial LEFT clavicle without change since previous imaging. CT ABDOMEN PELVIS FINDINGS Hepatobiliary: No focal, suspicious hepatic lesion. No pericholecystic stranding. Mild intrahepatic biliary duct distension with similar appearance and extrahepatic biliary duct distension as before. Pancreas: No peripancreatic stranding, ductal dilation or sign of inflammation. Spleen: Pancreas normal without ductal dilation or sign of  inflammation. Adrenals/Urinary Tract: LEFT adrenal gland measuring 1.1 x 0.9 cm previously 1.7 x 1.9 cm in June of 2021. RIGHT adrenal gland without clear sign of lesion on the current exam improved from the prior study. The kidneys enhance symmetrically. No hydronephrosis. Urinary bladder under distended limiting assessment. Stomach/Bowel: No acute gastrointestinal process. Appendix is normal. Vascular/Lymphatic: Atheromatous plaque of the abdominal aorta shows a similar appearance to previous imaging. There is no gastrohepatic or hepatoduodenal ligament lymphadenopathy. No retroperitoneal or mesenteric lymphadenopathy. No pelvic sidewall lymphadenopathy. Reproductive: Uterus and adnexa unremarkable by CT. Other: No ascites. Musculoskeletal: T3 sclerotic lesion similar to study from July 02, 2019. LEFT L1 sclerotic lesion also similar to the previous exam mixed lytic and sclerotic process in the LEFT iliac more diffuse lytic and sclerotic changes just above the LEFT acetabulum involving LEFT acetabulum and LEFT ischium with similar appearance. Scattered small sclerotic foci elsewhere including RIGHT pubic bone and RIGHT proximal femur without change since April of 2020 warm Sclerosis of RIGHT humeral head also similar. The other lesions outlined thoracic section without change. Redemonstration also LEFT femoral neck lesion with sclerosis and unchanged appearance. IMPRESSION: 1. Further consolidative changes in the RIGHT upper lobe at the site of previous RIGHT upper lobe nodule. More ill-defined consolidative process in the RIGHT upper lobe seen on the prior study is now more  organized. Findings are compatible with post radiation changes. Attention on follow-up. 2. Precarinal lymph node measuring 11 mm enlarged from prior imaging showing enhancement based on density values. Based on density finding is of moderate suspicion. Short interval follow-up could be considered for further assessment. Findings could also be  reactive. 3. Stable post treatment changes in RIGHT middle and lower lobe. 4. Decreased size of LEFT adrenal metastasis. Resolution of thickening of the RIGHT adrenal that was seen on the prior exam. 5. Similar appearance of mixed lytic and sclerotic changes throughout the axial and appendicular skeleton as described. 6. Unchanged small RIGHT-sided pleural effusion. 7. Unchanged intrahepatic and extrahepatic biliary duct distension with similar appearance to previous imaging. Correlate with any clinical signs of cholangitis or prior cholangitis. 8. Aortic atherosclerosis. Aortic Atherosclerosis (ICD10-I70.0). Electronically Signed   By: Zetta Bills M.D.   On: 10/09/2019 11:20    ASSESSMENT AND PLAN:  This is a very pleasant 49 years old Asian female with stage IV non-small cell lung cancer, adenocarcinoma with positive EGFR mutation with deletion in exon 19 diagnosed in July 2016 status post treatment with Tarceva for a total of 18 months but this was discontinued secondary to disease progression and development of T790M resistant mutation. The patient was started on treatment with Tagrisso 80 mg by mouth daily status post 38 months of treatment. She was also found to have MET amplification on repeat molecular studies. In addition to [place], the patient was a started on Xalkori 250 mg p.o. twice daily.  She has some side effect of this treatment with increasing fatigue and weakness as well as nausea and vomiting.  Hulda Humphrey was a change it to 250 once daily at nighttime.  She could not tolerate her treatment with Hulda Humphrey and it is currently on hold for the last 88-month. The patient is feeling much better now and tolerating her treatment with Tagrisso fairly well. She had repeat CT scan of the chest, abdomen pelvis performed recently.  I personally and independently reviewed the scans and discussed the results with the patient today. Her scan showed no concerning findings for disease progression except  for a slightly increased paratracheal lymph node that need close monitoring.  She has improvement in the adrenal glands as well as evaluation of the radiation changes in the right lung. I recommended for the patient to continue her current treatment with Tagrisso with the same dose. I will see her back for follow-up visit in 2 months for evaluation with repeat blood work. For the metastatic bone disease she will continue on treatment with Xgeva every 6 weeks. For the cough, I gave the patient a refill of Robitussin-AC. She was advised to call immediately if she has any concerning symptoms in the interval.  The patient voices understanding of current disease status and treatment options and is in agreement with the current care plan. All questions were answered. The patient knows to call the clinic with any problems, questions or concerns. We can certainly see the patient much sooner if necessary.  Disclaimer: This note was dictated with voice recognition software. Similar sounding words can inadvertently be transcribed and may not be corrected upon review.

## 2019-10-14 MED FILL — TAGRISSO 80 MG TABLET: 80 | 30 days supply | Qty: 30 | Fill #6

## 2019-10-16 ENCOUNTER — Telehealth: Payer: Self-pay | Admitting: Internal Medicine

## 2019-10-16 NOTE — Telephone Encounter (Signed)
Scheduled per los. Called and left msg. Mailed printout  °

## 2019-10-29 MED FILL — levETIRAcetam 250 MG TABS: 250 | 30 days supply | Qty: 60 | Fill #4

## 2019-11-04 ENCOUNTER — Inpatient Hospital Stay: Payer: Medicare Other | Attending: Internal Medicine

## 2019-11-04 ENCOUNTER — Inpatient Hospital Stay: Payer: Medicare Other

## 2019-11-04 ENCOUNTER — Other Ambulatory Visit: Payer: Self-pay

## 2019-11-04 VITALS — BP 96/65 | HR 103 | Temp 97.7°F | Resp 18

## 2019-11-04 DIAGNOSIS — C342 Malignant neoplasm of middle lobe, bronchus or lung: Secondary | ICD-10-CM | POA: Diagnosis not present

## 2019-11-04 DIAGNOSIS — C7951 Secondary malignant neoplasm of bone: Secondary | ICD-10-CM | POA: Diagnosis present

## 2019-11-04 DIAGNOSIS — C3491 Malignant neoplasm of unspecified part of right bronchus or lung: Secondary | ICD-10-CM

## 2019-11-04 DIAGNOSIS — C3490 Malignant neoplasm of unspecified part of unspecified bronchus or lung: Secondary | ICD-10-CM

## 2019-11-04 DIAGNOSIS — Z79899 Other long term (current) drug therapy: Secondary | ICD-10-CM | POA: Insufficient documentation

## 2019-11-04 DIAGNOSIS — M898X9 Other specified disorders of bone, unspecified site: Secondary | ICD-10-CM

## 2019-11-04 DIAGNOSIS — C3431 Malignant neoplasm of lower lobe, right bronchus or lung: Secondary | ICD-10-CM

## 2019-11-04 DIAGNOSIS — E889 Metabolic disorder, unspecified: Secondary | ICD-10-CM

## 2019-11-04 LAB — CMP (CANCER CENTER ONLY)
ALT: 45 U/L — ABNORMAL HIGH (ref 0–44)
AST: 34 U/L (ref 15–41)
Albumin: 3.4 g/dL — ABNORMAL LOW (ref 3.5–5.0)
Alkaline Phosphatase: 93 U/L (ref 38–126)
Anion gap: 8 (ref 5–15)
BUN: 13 mg/dL (ref 6–20)
CO2: 29 mmol/L (ref 22–32)
Calcium: 9.2 mg/dL (ref 8.9–10.3)
Chloride: 93 mmol/L — ABNORMAL LOW (ref 98–111)
Creatinine: 0.63 mg/dL (ref 0.44–1.00)
GFR, Estimated: >60 mL/min (ref >60)
Glucose, Bld: 74 mg/dL (ref 70–99)
Potassium: 4.5 mmol/L (ref 3.5–5.1)
Sodium: 130 mmol/L — ABNORMAL LOW (ref 135–145)
Total Bilirubin: 0.3 mg/dL (ref 0.3–1.2)
Total Protein: 7.7 g/dL (ref 6.5–8.1)

## 2019-11-04 LAB — CBC WITH DIFFERENTIAL (CANCER CENTER ONLY)
Abs Immature Granulocytes: 0.02 10*3/uL (ref 0.00–0.07)
Basophils Absolute: 0 10*3/uL (ref 0.0–0.1)
Basophils Relative: 0 %
Eosinophils Absolute: 0.1 10*3/uL (ref 0.0–0.5)
Eosinophils Relative: 1 %
HCT: 34.8 % — ABNORMAL LOW (ref 36.0–46.0)
Hemoglobin: 11.8 g/dL — ABNORMAL LOW (ref 12.0–15.0)
Immature Granulocytes: 0 %
Lymphocytes Relative: 12 %
Lymphs Abs: 0.6 10*3/uL — ABNORMAL LOW (ref 0.7–4.0)
MCH: 31 pg (ref 26.0–34.0)
MCHC: 33.9 g/dL (ref 30.0–36.0)
MCV: 91.3 fL (ref 80.0–100.0)
Monocytes Absolute: 0.7 10*3/uL (ref 0.1–1.0)
Monocytes Relative: 14 %
Neutro Abs: 3.5 10*3/uL (ref 1.7–7.7)
Neutrophils Relative %: 73 %
Platelet Count: 307 10*3/uL (ref 150–400)
RBC: 3.81 MIL/uL — ABNORMAL LOW (ref 3.87–5.11)
RDW: 13.2 % (ref 11.5–15.5)
WBC Count: 4.9 10*3/uL (ref 4.0–10.5)
nRBC: 0 % (ref 0.0–0.2)

## 2019-11-04 MED ORDER — DENOSUMAB 120 MG/1.7ML ~~LOC~~ SOLN
120.0000 mg | Freq: Once | SUBCUTANEOUS | Status: AC
  Administered 2019-11-04: 120 mg via SUBCUTANEOUS

## 2019-11-04 MED ORDER — DENOSUMAB 120 MG/1.7ML ~~LOC~~ SOLN
SUBCUTANEOUS | Status: AC
  Filled 2019-11-04: qty 1.7

## 2019-11-04 NOTE — Patient Instructions (Signed)
Denosumab injection ?y l thu?c g? DENOSUMAB lm ch?m s? suy y?u x??ng. Prolia ???c dng ?? ?i?u tr? ch?ng long x??ng ? ph? n? sau khi mn kinh v ? nam gi?i, v ? nh?ng ng??i dng cc thu?c corticosteroid trong 6 thng ho?c lu h?n. Xgeva ???c dng ?? ?i?u tr? tnh tr?ng t?ng cao calci v ung th? v ?? phng ng?a b? gy x??ng v cc v?n ?? v? x??ng khc gy ra do b?nh ?a u t?y ho?c cc di c?n x??ng trong ung th?[place] See c?ng ???c dng ?? ?i?u tr? b??u t? bo kh?ng l? (giant cell tumor) c?a x??ng. Thu?c ny c th? ???c dng cho nh?ng m?c ?ch khc; hy h?i ng??i cung c?p d?ch v? y t? ho?c d??c s? c?a mnh, n?u qu v? c th?c m?c. (CC) NHN HI?U PH? BI?N: Prolia, XGEVA Ti c?n ph?i bo cho ng??i cung c?p d?ch v? y t? c?a mnh ?i?u g tr??c khi dng thu?c ny? H? c?n bi?t li?u qu v? c b?t k? tnh tr?ng no sau ?y khng:  b?nh v? r?ng  ???c gi?i ph?u ho?c nh? r?ng  nhi?m trng  b?nh th?n  m?c calci ho?c Vitamin D trong mu th?p  suy dinh d??ng  ?ang ???c th?m phn  cc tnh tr?ng b?nh l ho?c m?n c?m c?a da  b?nh tuy?n gip tr?ng ho?c tuy?n c?n gip tr?ng  ph?n ?ng b?t th??ng v?i denosumab  pha?n ??ng b?t th???ng ho??c di? ??ng v??i ca?c d??c ph?m kha?c  pha?n ??ng b?t th???ng ho??c di? ??ng v??i th??c ph?m, thu?c nhu?m, ho??c ch?t ba?o qua?n  ?ang c thai ho??c ??nh co? thai  ?ang cho con bu? Ti nn s? d?ng thu?c ny nh? th? no? Thu?c ny ?? tim d??i da. Thu?c ny ???c s? d?ng b?i chuyn vin y t? ? b?nh vi?n ho?c ? phng m?ch. Qu v? s? ???c nh?n m?t B?n H??ng D?n v? D??c Ph?m (MedGuide) tr??c m?i l?n ?i?u tr?. Hy b?o ??m ??c k? thng tin ny m?i l?n. ??i v?i Prolia, hy bn v?i bc s? nhi khoa c?a qu v? v? vi?c dng thu?c ny ? tr? em. C th? c?n ch?m [place] ??c bi?t. ??i v?i Xgeva, hy bn v?i bc s? nhi khoa c?a qu v? v? vi?c dng thu?c ny ? tr? em. Thu?c ny c th? ???c k toa cho tr? em ch? m?i 13 tu?i trong nh?ng tr??ng h?p ch?n l?c, nh?ng c?n ph?i th?n  tr?ng. Qu li?u: N?u qu v? cho r?ng mnh ? dng qu nhi?u thu?c ny, th hy lin l?c v?i trung tm ki?m sot ch?t ??c ho?c phng c?p c?u ngay l?p t?c. L?U : Thu?c ny ch? dnh ring cho qu v?. Khng chia s? thu?c ny v?i nh?ng ng??i khc. N?u ti l? qun m?t li?u th sao? ?i?u quan tr?ng l khng nn b? l? li?u thu?c no. Hy lin l?c v?i bc s? ho?c [place] vin y t? c?a mnh, n?u qu v? khng th? gi? ?ng cu?c h?n khm. Nh?ng g c th? t??ng tc v?i thu?c ny? Khng ???c dng thu?c ny cng v?i b?t k? th? no sau ?y:  nh??ng thu?c kha?c co? ch??a denosumab Thu?c ny c?ng c th? t??ng tc v?i cc thu?c sau ?y:  nh?ng thu?c lm gi?m c? h?i ch?ng l?i b?nh nhi?m trng  cc thu?c steroid, ch?ng h?n nh? prednisone ho?c cortisone Danh sch ny c th? khng m t? ?? h?t cc t??ng tc c th? x?y ra. Hy ??a cho ng??i cung  c?p d?ch v? y t? c?a mnh danh sch t?t c? cc thu?c, th?o d??c, cc thu?c khng c?n toa, ho?c cc ch? ph?m b? sung m qu v? dng. C?ng nn bo cho h? bi?t r?ng qu v? c ht thu?c, u?ng r??u, ho?c c s? d?ng ma ty tri php hay khng. Vi th? c th? t??ng tc v?i thu?c c?a qu v?. Ti c?n ph?i theo di ?i?u g trong khi dng thu?c ny? Hy ?i g?p bc s? ho?c chuyn vin y t? ?? theo di ??nh k? s? c?i thi?n c?a qu v?. Bc s? c th? yu c?u th? mu v lm cc xt nghi?m khc ?? theo di s? c?i thi?n c?a qu v?. Hy h?i  ki?n bc s? ho?c chuyn vin y t?, n?u qu v? b? s?t, ?n l?nh ho?c ?au h?ng, ho?c c cc tri?u ch?ng khc c?a c?m l?nh ho?c cm. Khng ???c t? ?i?u tr? cho mnh. Thu?c ny c th? lm gi?m kh? n?ng ch?ng l?i cc b?nh nhi?m trng c?a c? th?. Hy c? trnh ? g?n nh?ng ng??i b? b?nh. Qu v? c?n ph?i ch?c ch?n r?ng c? th? qu v? nh?n ???c ??y ?? l??ng calci v vitamin D trong th?i gian dng thu?c ny, tr? khi bc s? yu c?u qu v? ??ng lm nh? v?y. Hy th?o lu?n v?i bc s? ho?c chuyn vin y t? v? nh?ng th?c ph?m qu v? ?n v nh?ng vitamin qu v? dng. Hy ?i nha s?  th??ng xuyn. ?nh r?ng ho?c x?a r?ng b?ng ch? nha khoa nh? ? ???c ch? d?n. N?u qu v? c ?i lm r?ng, th hy bo v?i nha s? r?ng qu v? ?ang dng thu?c ny. Khng ???c c thai trong th?i gian dng thu?c ny ho?c trong vng 5 thng sau khi ng?ng dng thu?c. Hy bn v?i bc s? ho?c chuyn vin y t? v? cc bi?n php ng?a thai trong th?i gian dng thu?c ny. Ph? n? c?n ph?i thng bo cho bc s? c?a mnh, n?u mu?n c thai ho?c ngh? r?ng c th? mnh ? c [place]. C nguy c? v? cc tc d?ng ph? nghim tr?ng ??i v?i [place] nhi. Hy th?o lu?n v?i bc s? ho?c chuyn vin y t? ho?c d??c s? ?? bi?t thm thng tin. Ti c th? nh?n th?y nh?ng tc d?ng ph? no khi dng thu?c ny? Nh?ng tc d?ng ph? qu v? c?n ph?i bo cho bc s? ho?c chuyn vin y t? cng s?m cng t?t:  cc ph?n ?ng d? ?ng, ch?ng h?n nh? da b? m?n ??, ng?a, n?i my ?ay, s?ng ? m?t, mi, ho?c l??i  ?au x??ng  kh th?  chng m?t  ?au hm, ??c bi?t l sau khi lm r?ng  b? ?? da, r?p da, bong da  cc d?u hi?u v tri?u ch?ng nhi?m trng, ch?ng h?n nh? s?t ho?c ?n l?nh; ho; ?au h?ng; kh ?i ti?u ho?c ?i ti?u ?au  cc d?u hi?u b? th?p calci, nh? l c nh?p tim nhanh, b? chu?t rt hay ?au b?p th?t; b? ?au, nh?c, b? t ? tay hay chn; b? co gi?t  b? b?m tm ho?c xu?t huy?t b?t th??ng  m?t m?i ho?c y?u ?t b?t th??ng Cc tc d?ng ph? khng c?n ph?i ch?m [place] y t? (hy bo cho bc s? ho?c chuyn vin y t?, n?u cc tc d?ng ph? ny ti?p di?n ho?c gy phi?n toi):  to bn  tiu ch?y  ?au ??u  ?au ho?c nh?c kh?p  m?t c?m gic ngon  mi?ng  ?au ho?c nh?c c? b?p  s? m?i  c?m th?y m?t m?i  kh ch?u ? bao t? Danh sch ny c th? khng m t? ?? h?t cc tc d?ng ph? c th? x?y ra. Xin g?i t?i bc s? c?a mnh ?? ???c c? v?n chuyn mn v? cc tc d?ng ph?Sander Nephew v? c th? t??ng trnh cc tc d?ng ph? cho FDA theo s? [phone]. Ti nn c?t gi? thu?c c?a mnh ? ?u? Thu?c ny ch? ???c dng trong phng m?ch, v?n phng bc s? ho?c c? s? y t? khc  v s? khng ???c c?t gi? t?i nh. L?U : ?y l b?n tm t?t. N c th? khng bao hm t?t c? thng tin c th? c. N?u qu v? th?c m?c v? thu?c ny, xin trao ??i v?i bc s?, d??c s?, ho?c ng??i cung c?p d?ch v? y t? c?a mnh.  2020 [employer] Standard (2017-06-17 00:00:00)

## 2019-11-07 ENCOUNTER — Other Ambulatory Visit (HOSPITAL_COMMUNITY): Payer: Self-pay | Admitting: Medical

## 2019-11-07 ENCOUNTER — Emergency Department (HOSPITAL_COMMUNITY): Payer: Medicare Other

## 2019-11-07 ENCOUNTER — Emergency Department (HOSPITAL_COMMUNITY)
Admission: EM | Admit: 2019-11-07 | Discharge: 2019-11-07 | Disposition: A | Discharge status: Home / Self Care | Payer: Medicare Other | Attending: Emergency Medicine | Admitting: Emergency Medicine

## 2019-11-07 ENCOUNTER — Encounter (HOSPITAL_COMMUNITY): Payer: Self-pay

## 2019-11-07 ENCOUNTER — Other Ambulatory Visit: Payer: Self-pay

## 2019-11-07 DIAGNOSIS — R300 Dysuria: Secondary | ICD-10-CM | POA: Diagnosis not present

## 2019-11-07 DIAGNOSIS — R109 Unspecified abdominal pain: Secondary | ICD-10-CM | POA: Insufficient documentation

## 2019-11-07 DIAGNOSIS — R000 Tachycardia, unspecified: Secondary | ICD-10-CM | POA: Diagnosis not present

## 2019-11-07 DIAGNOSIS — M545 Low back pain, unspecified: Secondary | ICD-10-CM | POA: Diagnosis not present

## 2019-11-07 DIAGNOSIS — R002 Palpitations: Secondary | ICD-10-CM | POA: Diagnosis not present

## 2019-11-07 DIAGNOSIS — Z7901 Long term (current) use of anticoagulants: Secondary | ICD-10-CM | POA: Insufficient documentation

## 2019-11-07 DIAGNOSIS — R0789 Other chest pain: Secondary | ICD-10-CM | POA: Insufficient documentation

## 2019-11-07 LAB — URINALYSIS, ROUTINE W REFLEX MICROSCOPIC
Bilirubin Urine: NEGATIVE
Glucose, UA: NEGATIVE mg/dL
Hgb urine dipstick: NEGATIVE
Ketones, ur: 20 mg/dL — AB
Leukocytes,Ua: NEGATIVE
Nitrite: NEGATIVE
Protein, ur: 30 mg/dL — AB
Specific Gravity, Urine: 1.01 (ref 1.005–1.030)
pH: 8 (ref 5.0–8.0)

## 2019-11-07 LAB — CBC WITH DIFFERENTIAL/PLATELET
Abs Immature Granulocytes: 0.02 10*3/uL (ref 0.00–0.07)
Basophils Absolute: 0 10*3/uL (ref 0.0–0.1)
Basophils Relative: 0 %
Eosinophils Absolute: 0 10*3/uL (ref 0.0–0.5)
Eosinophils Relative: 1 %
HCT: 32.3 % — ABNORMAL LOW (ref 36.0–46.0)
Hemoglobin: 10.9 g/dL — ABNORMAL LOW (ref 12.0–15.0)
Immature Granulocytes: 0 %
Lymphocytes Relative: 9 %
Lymphs Abs: 0.5 10*3/uL — ABNORMAL LOW (ref 0.7–4.0)
MCH: 31.4 pg (ref 26.0–34.0)
MCHC: 33.7 g/dL (ref 30.0–36.0)
MCV: 93.1 fL (ref 80.0–100.0)
Monocytes Absolute: 0.8 10*3/uL (ref 0.1–1.0)
Monocytes Relative: 15 %
Neutro Abs: 3.9 10*3/uL (ref 1.7–7.7)
Neutrophils Relative %: 75 %
Platelets: 297 10*3/uL (ref 150–400)
RBC: 3.47 MIL/uL — ABNORMAL LOW (ref 3.87–5.11)
RDW: 13.2 % (ref 11.5–15.5)
WBC: 5.3 10*3/uL (ref 4.0–10.5)
nRBC: 0 % (ref 0.0–0.2)

## 2019-11-07 LAB — BASIC METABOLIC PANEL
Anion gap: 8 (ref 5–15)
BUN: 11 mg/dL (ref 6–20)
CO2: 20 mmol/L — ABNORMAL LOW (ref 22–32)
Calcium: 7.7 mg/dL — ABNORMAL LOW (ref 8.9–10.3)
Chloride: 98 mmol/L (ref 98–111)
Creatinine, Ser: 0.53 mg/dL (ref 0.44–1.00)
GFR, Estimated: >60 mL/min (ref >60)
Glucose, Bld: 80 mg/dL (ref 70–99)
Potassium: 3.7 mmol/L (ref 3.5–5.1)
Sodium: 126 mmol/L — ABNORMAL LOW (ref 135–145)

## 2019-11-07 LAB — TROPONIN I (HIGH SENSITIVITY)
Troponin I (High Sensitivity): 62 ng/L — ABNORMAL HIGH (ref <18)
Troponin I (High Sensitivity): 56 ng/L — ABNORMAL HIGH (ref <18)

## 2019-11-07 LAB — MAGNESIUM: Magnesium: 2.1 mg/dL (ref 1.7–2.4)

## 2019-11-07 LAB — HEPATIC FUNCTION PANEL
ALT: 31 U/L (ref 0–44)
AST: 25 U/L (ref 15–41)
Albumin: 3.4 g/dL — ABNORMAL LOW (ref 3.5–5.0)
Alkaline Phosphatase: 68 U/L (ref 38–126)
Bilirubin, Direct: 0.1 mg/dL (ref 0.0–0.2)
Indirect Bilirubin: 0.3 mg/dL (ref 0.3–0.9)
Total Bilirubin: 0.4 mg/dL (ref 0.3–1.2)
Total Protein: 6.9 g/dL (ref 6.5–8.1)

## 2019-11-07 LAB — I-STAT BETA HCG BLOOD, ED (MC, WL, AP ONLY): I-stat hCG, quantitative: <5 m[IU]/mL (ref <5)

## 2019-11-07 LAB — LACTIC ACID, PLASMA
Lactic Acid, Venous: 0.8 mmol/L (ref 0.5–1.9)
Lactic Acid, Venous: 0.6 mmol/L (ref 0.5–1.9)

## 2019-11-07 LAB — APTT: aPTT: 34 seconds (ref 24–36)

## 2019-11-07 LAB — PROTIME-INR
INR: 1 (ref 0.8–1.2)
Prothrombin Time: 12.9 seconds (ref 11.4–15.2)

## 2019-11-07 LAB — LIPASE, BLOOD: Lipase: 50 U/L (ref 11–51)

## 2019-11-07 MED ORDER — IOHEXOL 350 MG/ML SOLN
100.0000 mL | Freq: Once | INTRAVENOUS | Status: AC | PRN
  Administered 2019-11-07: 100 mL via INTRAVENOUS

## 2019-11-07 MED ORDER — HYDROCODONE-ACETAMINOPHEN 5-325 MG PO TABS
1.0000 | ORAL_TABLET | Freq: Four times a day (QID) | ORAL | 0 refills | Status: DC | PRN
Start: 2019-11-07 — End: 2019-11-10

## 2019-11-07 MED ORDER — SODIUM CHLORIDE (PF) 0.9 % IJ SOLN
INTRAMUSCULAR | Status: AC
  Filled 2019-11-07: qty 50

## 2019-11-07 MED ORDER — MORPHINE SULFATE (PF) 2 MG/ML IV SOLN
2.0000 mg | Freq: Once | INTRAVENOUS | Status: AC
  Administered 2019-11-07: 2 mg via INTRAVENOUS
  Filled 2019-11-07: qty 1

## 2019-11-07 MED ORDER — SODIUM CHLORIDE 0.9 % IV BOLUS
500.0000 mL | Freq: Once | INTRAVENOUS | Status: AC
  Administered 2019-11-07: 500 mL via INTRAVENOUS

## 2019-11-07 MED FILL — HYDROCODON-APAP 5-325: 5-325 | 2 days supply | Qty: 6 | Fill #0

## 2019-11-07 NOTE — ED Provider Notes (Addendum)
[facility] DEPT Provider Note   CSN: [id number] Arrival date & time: 11/07/19  3536     History Chief Complaint  Patient presents with  . Irregular Heart Beat   Video translator used throughout this visit. Deborah Foster is a 49 y.o. female history of metastatic lung cancer, DVT on Xarelto.  Patient called EMS this morning when she noticed that her heart rate was high and she was having palpitations.  She reports that this lasted until EMS arrived and gave her medication, she reports her palpitations have gradually resolved.  She reports that prior to medication ministration by EMS she was having some chest pressure mild nonradiating no aggravating factors completely resolved with medication.  Additionally patient reports left flank pain ongoing for the past 2-3 days, aching constant nonradiating no aggravating or alleviating factors.  Associated with dysuria for the past 1 week.  She is not been on any antibiotics.  She denies any other concerns.  Denies fever/chills, headache, chest pain, shortness of breath, hemoptysis, abdominal pain, fall/injury or any additional concerns.  HPI     Past Medical History:  Diagnosis Date  . Bone metastasis ([place])   . Hemoptysis   . Hypokalemia   . lung ca dx'd 07/2014  . Lung mass   . Metastasis to adrenal gland ([place])   . Metastasis to brain ([place])   . Pneumonia   . Radiation 08/23/14-09/07/14   Brain/chest and left hip 30 Gy 12 Fx  . URI (upper respiratory infection) 02-02-2015    Patient Active Problem List   Diagnosis Date Noted  . Lung metastasis ([place]) 04/30/2019  . Goals of care, counseling/discussion 05/05/2018  . Epigastric pain 05/05/2018  . Blurry vision, bilateral 05/05/2018  . 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 Metastasis to supraclavicular lymph node ([place]) 04/08/2018  . Difficulty sleeping 03/20/2018  . Primary cancer of right lower lobe of lung ([place]) 07/22/2015  .  Malnutrition of moderate degree 07/21/2015  . Hemoptysis   . Primary lung adenocarcinoma ([place])   . Odynophagia 07/05/2015  . Folic acid deficiency 14/43/1540  . B12 deficiency 06/06/2015  . Radiation pneumonitis with hemoptysis 06/06/2015  . Bone metastases ([place]) 01/20/2015  . Malignant neoplasm metastatic to left adrenal gland ([place]) 01/20/2015  . Bronchitis 11/22/2014  . Encounter for antineoplastic chemotherapy 11/02/2014  . Mucositis 10/22/2014  . Rash 10/08/2014  . Non-small cell carcinoma of lung, stage 4 ([place]) 08/26/2014  . Bone disease, metabolic 08/67/6195  . Metastasis to brain ([place])   . Palliative care encounter   . Adrenal mass, left ([place])   . CAP (community acquired pneumonia) 08/07/2014  . Pneumonia 08/07/2014  . Lung mass 08/07/2014    Past Surgical History:  Procedure Laterality Date  . IR GENERIC HISTORICAL  07/14/2015   IR RADIOLOGIST EVAL & MGMT 07/14/2015 Greggory Keen, MD [facility] INTERV RAD  . VIDEO BRONCHOSCOPY Bilateral 08/09/2014   Procedure: VIDEO BRONCHOSCOPY WITHOUT FLUORO;  Surgeon: [name], MD;  Location: [facility] ENDOSCOPY;  Service: Cardiopulmonary;  Laterality: Bilateral;  . VIDEO BRONCHOSCOPY Bilateral 07/06/2015   Procedure: VIDEO BRONCHOSCOPY WITHOUT FLUORO;  Surgeon: [name], MD;  Location: [facility] ENDOSCOPY;  Service: Cardiopulmonary;  Laterality: Bilateral;     OB History   No obstetric history on file.     Family History  Problem Relation Age of Onset  . Hyperlipidemia Mother     Social History   Tobacco Use  . Smoking status: Never Smoker  . Smokeless tobacco:  Never Used  Vaping Use  . Vaping Use: Never used  Substance Use Topics  . Alcohol use: No    Alcohol/week: 0.0 standard drinks  . Drug use: No    Home Medications Prior to Admission medications   Medication Sig Start Date End Date Taking? Authorizing Provider  Calcium Carbonate-Vitamin D (CALCIUM 500 + D) 500-125 MG-UNIT TABS Take 500 mg by mouth 2 (two) times  daily.   Yes [provider]  clobetasol cream (TEMOVATE) 3.64 % Apply 1 application topically 2 (two) times daily.   Yes [provider]  Cyanocobalamin (VITAMIN B-12) 2500 MCG SUBL Place 2,500 mcg under the tongue daily.   Yes [provider]  ENSURE (ENSURE) Take 1 Can by mouth 2 (two) times daily between meals.    Yes [provider]  ferrous sulfate 325 (65 FE) MG EC tablet Take 325 mg by mouth daily with breakfast.   Yes [provider]  fluticasone (VERAMYST) 27.5 MCG/SPRAY nasal spray Place 2 sprays into the nose daily.   Yes [provider]  folic acid (FOLVITE) 680 MCG tablet Take 1 tablet (800 mcg total) by mouth daily. 07/28/15  Yes [name], MD  levETIRAcetam (KEPPRA) 250 MG tablet Take 1 tablet (250 mg total) by mouth 2 (two) times daily. 07/02/19  Yes Vaslow, Acey Lav, MD  loratadine (CLARITIN) 10 MG tablet Take 1 tablet (10 mg total) by mouth daily. 04/30/16  Yes [name], MD  mirtazapine (REMERON) 15 MG tablet Take 1 tablet (15 mg total) by mouth at bedtime. 03/10/19  Yes Heilingoetter, Cassandra L, PA-C  Nutritional Supplements (JUICE PLUS FIBRE PO) Take 1 capsule by mouth daily. Reported on 07/28/2015   Yes [provider]  ondansetron (ZOFRAN) 8 MG tablet Take 8 mg by mouth every 8 (eight) hours as needed. 06/30/19  Yes [provider]  OVER THE COUNTER MEDICATION "Deep penetrating pain relief oil"   Yes [provider]  prochlorperazine (COMPAZINE) 10 MG tablet TAKE 1 TABLET BY MOUTH EVERY 6 HOURS AS NEEDED FOR NAUSEA OR VOMITING. Patient taking differently: Take 10 mg by mouth every 6 (six) hours as needed for nausea. TAKE 1 TABLET BY MOUTH EVERY 6 HOURS AS NEEDED FOR NAUSEA OR VOMITING. 03/10/19  Yes Heilingoetter, Cassandra L, PA-C  ranitidine (ZANTAC) 300 MG tablet Take 1 tablet (300 mg total) by mouth at bedtime. 12/06/15  Yes Nandigam, Venia Minks, MD  TAGRISSO 80 MG tablet TAKE 1  TABLET (80 MG TOTAL) BY MOUTH DAILY. 04/28/19  Yes [name], MD  TURMERIC PO Take 1 capsule by mouth 2 (two) times daily. Turmeric Superior 750 mg po bid   Yes [provider]  AMBULATORY NON FORMULARY MEDICATION Medication Name: MAGIC MOUTHWASH 2 % Viscous Lidocaine  Maalox Benadryl Susp.  Disp: 1:1:1 266ml bottle Sig: 72ml PO Swish & Spit  q 3-4hrs. PRN mouth sores Patient not taking: Reported on 11/07/2019 07/05/16   [name], MD  HYDROcodone-acetaminophen (NORCO/VICODIN) 5-325 MG tablet Take 1 tablet by mouth every 6 (six) hours as needed. 11/07/19   Nuala Alpha A, PA-C  rivaroxaban (XARELTO) 20 MG TABS tablet TAKE 1 TABLET (20 MG TOTAL) BY MOUTH DAILY WITH SUPPER. Patient not taking: Reported on 11/07/2019 06/02/19   [name], MD  Hulda Humphrey 250 MG capsule TAKE 1 CAPSULE BY MOUTH 2 TIMES DAILY. Patient not taking: Reported on 11/07/2019 06/23/19   [name], MD    Allergies    Other and Doxycycline  Review of Systems  Review of Systems Ten systems are reviewed and are negative for acute change except as noted in the HPI  Physical Exam Updated Vital Signs BP 93/66   Pulse (!) 102   Temp 97.8 F (36.6 C) (Oral)   Resp 16   Ht 4\' 11"  (1.499 m)   Wt 38.1 kg   SpO2 100%   BMI 16.97 kg/m   Physical Exam Constitutional:      General: She is not in acute distress.    Appearance: Normal appearance. She is well-developed. She is not ill-appearing or diaphoretic.  HENT:     Head: Normocephalic and atraumatic.  Eyes:     General: Vision grossly intact. Gaze aligned appropriately.     Pupils: Pupils are equal, round, and reactive to light.  Neck:     Trachea: Trachea and phonation normal.  Cardiovascular:     Rate and Rhythm: Regular rhythm. Tachycardia present.  Pulmonary:     Effort: Pulmonary effort is normal. No accessory muscle usage or respiratory distress.     Breath sounds: Decreased air movement present.  Abdominal:      General: There is no distension.     Palpations: Abdomen is soft.     Tenderness: There is no abdominal tenderness. There is no guarding or rebound.  Musculoskeletal:        General: Normal range of motion.     Cervical back: Normal range of motion.  Skin:    General: Skin is warm and dry.  Neurological:     Mental Status: She is alert.     GCS: GCS eye subscore is 4. GCS verbal subscore is 5. GCS motor subscore is 6.     Comments: Speech is clear and goal oriented, follows commands Major Cranial nerves without deficit, no facial droop Moves extremities without ataxia, coordination intact  Psychiatric:        Behavior: Behavior normal.     ED Results / Procedures / Treatments   Labs (all labs ordered are listed, but only abnormal results are displayed) Labs Reviewed  CBC WITH DIFFERENTIAL/PLATELET - Abnormal; Notable for the following components:      Result Value   RBC 3.47 (*)    Hemoglobin 10.9 (*)    HCT 32.3 (*)    Lymphs Abs 0.5 (*)    All other components within normal limits  BASIC METABOLIC PANEL - Abnormal; Notable for the following components:   Sodium 126 (*)    CO2 20 (*)    Calcium 7.7 (*)    All other components within normal limits  URINALYSIS, ROUTINE W REFLEX MICROSCOPIC - Abnormal; Notable for the following components:   Ketones, ur 20 (*)    Protein, ur 30 (*)    Bacteria, UA RARE (*)    All other components within normal limits  HEPATIC FUNCTION PANEL - Abnormal; Notable for the following components:   Albumin 3.4 (*)    All other components within normal limits  TROPONIN I (HIGH SENSITIVITY) - Abnormal; Notable for the following components:   Troponin I (High Sensitivity) 62 (*)    All other components within normal limits  TROPONIN I (HIGH SENSITIVITY) - Abnormal; Notable for the following components:   Troponin I (High Sensitivity) 56 (*)    All other components within normal limits  CULTURE, BLOOD (SINGLE)  URINE CULTURE  MAGNESIUM  LACTIC  ACID, PLASMA  LACTIC ACID, PLASMA  PROTIME-INR  APTT  LIPASE, BLOOD  I-STAT BETA HCG BLOOD, ED ([facility], [facility], [facility] ONLY)  EKG EKG Interpretation  Date/Time:  Saturday November 07 2019 07:40:19 EDT Ventricular Rate:  108 PR Interval:    QRS Duration: 76 QT Interval:  344 QTC Calculation: 462 R Axis:   81 Text Interpretation: Sinus tachycardia Since last tracing of earlier today No significant change was found Confirmed by Daleen Bo [phone]) on 11/07/2019 7:55:43 AM   Radiology CT Angio Chest PE W and/or Wo Contrast  Result Date: 11/07/2019 CLINICAL DATA:  Metastatic lung cancer with right-sided abdominal pain EXAM: CT ANGIOGRAPHY CHEST CT ABDOMEN AND PELVIS WITH CONTRAST TECHNIQUE: Multidetector CT imaging of the chest was performed using the standard protocol during bolus administration of intravenous contrast. Multiplanar CT image reconstructions and MIPs were obtained to evaluate the vascular anatomy. Multidetector CT imaging of the abdomen and pelvis was performed using the standard protocol during bolus administration of intravenous contrast. CONTRAST:  121mL OMNIPAQUE IOHEXOL 350 MG/ML SOLN COMPARISON:  10/09/2019 FINDINGS: CTA CHEST FINDINGS Cardiovascular: Normal heart size with no pericardial effusion. Large main pulmonary artery with presumed pulmonary hypertension. Asymmetric poor opacification of right lower lobe pulmonary arteries, likely hypoxic mediated shunting due to the degree of scarring. No pulmonary emboli are seen. Mediastinum/Nodes: No lymphadenopathy. Lungs/Pleura: Radiation fibrosis appearance to the right upper, right middle, and right lower lobes with traction bronchiectasis and architectural distortion, unchanged. Trace pleural fluid on the right. Clear left lung. Musculoskeletal: Area of sclerosis to the lateral right third rib and medial right clavicle, stable and possibly radiation osteitis or metastatic disease, stable. No acute fracture. Review of the MIP images  confirms the above findings. CT ABDOMEN and PELVIS FINDINGS Hepatobiliary: Band of hypointensity in the inferior right lobe liver, given constellation of findings suspect radiation changes. Mild chronic intra and extrahepatic bile duct dilatation. Small cystic densities in the subcapsular liver. Pancreas: Patchy increased enhancement in the pancreatic body which is mildly expanded. Spleen: Small spleen without focal abnormality Adrenals/Urinary Tract: Negative adrenals. Best seen on coronal reformats there is hypoenhancement in the medullary spaces of the upper pole kidneys where there is also less cortical thickness. Prominent urothelial thickness along the bilateral upper ureters, similar to prior. Unremarkable bladder. Stomach/Bowel: No definite bowel inflammation and no bowel obstruction. Vascular/Lymphatic: Atheromatous plaque with stable strandy density around the aorta. No mass or adenopathy. Reproductive:No pathologic findings. Other: Small volume pelvic ascites considered reactive. Musculoskeletal: Osseous metastatic disease with most confluent sclerosis in left acetabulum where there is expansion. No soft tissue tumor extension or pathologic fracture is seen to explain the history. Review of the MIP images confirms the above findings. IMPRESSION: Chest CTA: 1. No evidence of pulmonary embolism. 2. Stable radiation changes in the right lung with trace pleural fluid. Abdominal CT: 1. Band of abnormality affecting the liver, pancreas, and upper pole kidneys likely related to adrenal radiation (if administered). Please correlate with labs for pancreatitis. 2. Known osseous metastatic disease without acute fracture. 3. Prominent bilateral upper ureteral thickness, stable and without hydronephrosis. Electronically Signed   By: Monte Fantasia M.D.   On: 11/07/2019 10:38   CT ABDOMEN PELVIS W CONTRAST  Result Date: 11/07/2019 CLINICAL DATA:  Metastatic lung cancer with right-sided abdominal pain EXAM: CT  ANGIOGRAPHY CHEST CT ABDOMEN AND PELVIS WITH CONTRAST TECHNIQUE: Multidetector CT imaging of the chest was performed using the standard protocol during bolus administration of intravenous contrast. Multiplanar CT image reconstructions and MIPs were obtained to evaluate the vascular anatomy. Multidetector CT imaging of the abdomen and pelvis was performed using the standard protocol during bolus administration of  intravenous contrast. CONTRAST:  123mL OMNIPAQUE IOHEXOL 350 MG/ML SOLN COMPARISON:  10/09/2019 FINDINGS: CTA CHEST FINDINGS Cardiovascular: Normal heart size with no pericardial effusion. Large main pulmonary artery with presumed pulmonary hypertension. Asymmetric poor opacification of right lower lobe pulmonary arteries, likely hypoxic mediated shunting due to the degree of scarring. No pulmonary emboli are seen. Mediastinum/Nodes: No lymphadenopathy. Lungs/Pleura: Radiation fibrosis appearance to the right upper, right middle, and right lower lobes with traction bronchiectasis and architectural distortion, unchanged. Trace pleural fluid on the right. Clear left lung. Musculoskeletal: Area of sclerosis to the lateral right third rib and medial right clavicle, stable and possibly radiation osteitis or metastatic disease, stable. No acute fracture. Review of the MIP images confirms the above findings. CT ABDOMEN and PELVIS FINDINGS Hepatobiliary: Band of hypointensity in the inferior right lobe liver, given constellation of findings suspect radiation changes. Mild chronic intra and extrahepatic bile duct dilatation. Small cystic densities in the subcapsular liver. Pancreas: Patchy increased enhancement in the pancreatic body which is mildly expanded. Spleen: Small spleen without focal abnormality Adrenals/Urinary Tract: Negative adrenals. Best seen on coronal reformats there is hypoenhancement in the medullary spaces of the upper pole kidneys where there is also less cortical thickness. Prominent urothelial  thickness along the bilateral upper ureters, similar to prior. Unremarkable bladder. Stomach/Bowel: No definite bowel inflammation and no bowel obstruction. Vascular/Lymphatic: Atheromatous plaque with stable strandy density around the aorta. No mass or adenopathy. Reproductive:No pathologic findings. Other: Small volume pelvic ascites considered reactive. Musculoskeletal: Osseous metastatic disease with most confluent sclerosis in left acetabulum where there is expansion. No soft tissue tumor extension or pathologic fracture is seen to explain the history. Review of the MIP images confirms the above findings. IMPRESSION: Chest CTA: 1. No evidence of pulmonary embolism. 2. Stable radiation changes in the right lung with trace pleural fluid. Abdominal CT: 1. Band of abnormality affecting the liver, pancreas, and upper pole kidneys likely related to adrenal radiation (if administered). Please correlate with labs for pancreatitis. 2. Known osseous metastatic disease without acute fracture. 3. Prominent bilateral upper ureteral thickness, stable and without hydronephrosis. Electronically Signed   By: Monte Fantasia M.D.   On: 11/07/2019 10:38   DG Chest Port 1 View  Result Date: 11/07/2019 CLINICAL DATA:  Questionable sepsis EXAM: PORTABLE CHEST 1 VIEW COMPARISON:  08/03/2019 FINDINGS: Asymmetric volume loss and opacity on the right where there is radiation fibrosis by recent CT. The left chest is clear. Normal heart size and stable mediastinal contours. No visible effusion or pneumothorax. IMPRESSION: Stable post treatment changes on the right. Electronically Signed   By: Monte Fantasia M.D.   On: 11/07/2019 07:58    Procedures Procedures (including critical care time)  Medications Ordered in ED Medications  sodium chloride (PF) 0.9 % injection (has no administration in time range)  sodium chloride 0.9 % bolus 500 mL (0 mLs Intravenous Stopped 11/07/19 1018)  iohexol (OMNIPAQUE) 350 MG/ML injection 100  mL (100 mLs Intravenous Contrast Given 11/07/19 0953)  morphine 2 MG/ML injection 2 mg (2 mg Intravenous Given 11/07/19 1027)    ED Course  I have reviewed the triage vital signs and the nursing notes.  Pertinent labs & imaging results that were available during my care of the patient were reviewed by me and considered in my medical decision making (see chart for details).    MDM Rules/Calculators/A&P  Additional history obtained from: 1. Nursing notes from this visit. 2. Review of electronic medical record.  Reviewed patient's most recent oncology note from October 12, 2019.  Diagnosis of stage IV non-small cell lung cancer adenocarcinoma diagnosed July 2016.  Additionally had DVT of the left lower extremity diagnosed March 2021.  Current therapies include [place] -------------- 49 year old female presented for palpitations onset this morning associated with some chest pressure.  She was noted to be in SVT by EMS given adenosine with improvement.  On initial evaluation patient remains tachycardic heart rate around 115 bpm she denies any chest pressure or palpitations.  She has additional concern of some dysuria and left flank pain for the last week.  Will obtain rectal temperature and his labs for concern of infection.  In triage CBC BMP and magnesium were ordered.  Mag within normal limits, BMP shows hyponatremia of 126 and hypocalcemia of 7.7, no AKI.  CBC shows baseline hemoglobin of 10.9, no leukocytosis. ------------- Based on patient's history work-up was expanded to include CT angio chest to rule out pulmonary embolism as well as CT abdomen pelvis to assess for intra-abdominal pathology. ------------- Lactic within normal limits, reassuring Lipase within normal limits, doubt pancreatitis PT/INR APTT within normal limits LFTs nonacute Urinalysis without evidence of infection, urine culture sent based on prior orders High-sensitivity troponin  minimally elevated 62 delta troponin 56, no indication for third troponin.  Possibly secondary to radiation therapies and chronic disease, low suspicion for ACS or other emergent cardiopulmonary etiologies. BMP showed hyponatremia of 126, this was repleted with normal saline in the ER and will be rechecked at follow-up appointment, potassium within normal limits, normal gap.  EKG: Sinus tachycardia since last tracing no significant change Confirmed by Malvin Johns [phone]) on 11/07/2019 6:02:21 AM  CXR:    IMPRESSION:  Stable post treatment changes on the right.   CT Chest Angio PE Study and CT Abd/Pelvis:  IMPRESSION:  Chest CTA:    1. No evidence of pulmonary embolism.  2. Stable radiation changes in the right lung with trace pleural  fluid.    Abdominal CT:    1. Band of abnormality affecting the liver, pancreas, and upper pole  kidneys likely related to adrenal radiation (if administered).  Please correlate with labs for pancreatitis.  2. Known osseous metastatic disease without acute fracture.  3. Prominent bilateral upper ureteral thickness, stable and without  hydronephrosis.  ------------------- Overall patient's work-up is reassuring.  No evidence of infection or surgical emergency.  Possible patient's flank pain is due to simply post radiation changes versus metastases of the bone.  She has no neurologic symptoms concerning for cauda equina or cord compression.  She will need follow-up with her oncologist.  As to patient's dysuria earlier unclear cause no evidence of UTI culture sent, she will follow-up with her PCP and oncologist.  Vital signs reviewed patient's blood pressures have been soft throughout visit with systolic 82N and low 562Z, I reviewed patient's chart and this appears baseline along with mild tachycardia by reviewing visits back to June of this year.  PMD P reviewed no recent narcotic prescriptions, short course (6 pills) Norco prescribed for acute left  lower back pain without sciatica.  Narcotic precautions discussed with patient. - At discharge patient was reevaluated, eating and drinking without difficulty, reports she is feeling well no acute distress and ambulatory.  At this time there does not appear to be any evidence of an acute emergency medical condition and the patient appears  stable for discharge with appropriate outpatient follow up. Diagnosis was discussed with patient who verbalizes understanding of care plan and is agreeable to discharge. I have discussed return precautions with patient and family at bedside who verbalizes understanding. Patient encouraged to follow-up with their PCP and oncology. All questions answered.  Patient seen and evaluated by Dr. Eulis Foster during this visit who agrees with discharge.  Video interpreter used throughout this visit.  Note: Portions of this report may have been transcribed using voice recognition software. Every effort was made to ensure accuracy; however, inadvertent computerized transcription errors may still be present. Final Clinical Impression(s) / ED Diagnoses Final diagnoses:  Palpitations  Acute right-sided low back pain without sciatica    Rx / DC Orders ED Discharge Orders         Ordered    HYDROcodone-acetaminophen (NORCO/VICODIN) 5-325 MG tablet  Every 6 hours PRN        11/07/19 1406            Deliah Boston, PA-C 11/07/19 1416    Gari Crown 11/07/19 1539    Daleen Bo, MD 11/08/19 [phone]

## 2019-11-07 NOTE — ED Provider Notes (Signed)
  Face-to-face evaluation   History: She presents for evaluation of palpitations and chest pressure.  EMS found the patient in rapid rate, and treated her with adenosine for suspected SVT.  This converted her to sinus tachycardia.  On presentation she also complained of upper abdominal pain.  Physical exam: Alert, cooperative, comfortable at the time of 12:35 PM.  Abdomen soft with mild epigastric tenderness.  No respiratory distress.  Not tachycardic currently.  Medical screening examination/treatment/procedure(s) were conducted as a shared visit with non-physician practitioner(s) and myself.  I personally evaluated the patient during the encounter    Daleen Bo, MD 11/08/19 [phone]

## 2019-11-07 NOTE — Discharge Instructions (Addendum)
At this time there does not appear to be the presence of an emergent medical condition, however there is always the potential for conditions to change. Please read and follow the below instructions.  Please return to the Emergency Department immediately for any new or worsening symptoms. Please be sure to follow up with your Primary Care Provider within one week regarding your visit today; please call their office to schedule an appointment even if you are feeling better for a follow-up visit. You may take the medication Norco (Hydrocodone/Acetaminophen) as prescribed to help with severe pain.  This medication will make you drowsy so do not drive, drink alcohol, take other sedating medications or perform any dangerous activities such as driving after taking Norco. Norco contains Tylenol (acetaminophen) so do not take any other Tylenol-containing products with Norco.  Go to the nearest Emergency Department immediately if: You have fever or chills Have chest pain or shortness of breath. Have a severe headache. Feel dizzy or you faint. You have any new/concerning or worsening of symptoms  Please read the additional information packets attached to your discharge summary.  Do not take your medicine if  develop an itchy rash, swelling in your mouth or lips, or difficulty breathing; call 911 and seek immediate emergency medical attention if this occurs.  You may review your lab tests and imaging results in their entirety on your MyChart account.  Please discuss all results of fully with your primary care provider and other specialist at your follow-up visit.  Note: Portions of this text may have been transcribed using voice recognition software. Every effort was made to ensure accuracy; however, inadvertent computerized transcription errors may still be present. --------------------------- Below has been translated using Google translate.  Errors may be present.  Interpret with caution.  D??i ?y ?  ???c d?ch b?ng Google d?ch. C th? c l?i. Gi?i thch m?t cch th?n tr?ng. --------------------------- T?i th?i ?i?m ny, d??ng nh? khng c s? hi?n di?n c?a m?t tnh tr?ng y t? kh?n c?p, tuy nhin lun c kh? n?ng cc ?i?u ki?n thay ??i. Vui lng ??c v lm theo h??ng d?n bn d??i.  1. Vui lng tr? l?i [place] C?p c?u ngay l?p t?c n?u c b?t k? tri?u ch?ng m?i ho?c x?u ?i. 2. Hy ??m b?o lin h? v?i Nh cung c?p d?ch v? ch?m [place] chnh c?a b?n trong vng m?t tu?n v? chuy?n th?m c?a b?n hm nay; vui lng g?i ??n v?n phng c?a h? ?? s?p x?p m?t cu?c h?n ngay c? khi b?n c?m th?y t?t h?n ?? ti khm. 3. B?n c th? dng thu?c Norco (Hydrocodone / Acetaminophen) theo quy ??nh ?? gi?m ?au d? d?i. Thu?c ny s? lm cho b?n bu?n ng? v v?y khng nn li xe, u?ng r??u, dng cc lo?i thu?c an th?n khc ho?c th?c hi?n b?t k? ho?t ??ng nguy hi?m no nh? li xe sau khi dng Norco. Norco c ch?a Tylenol (acetaminophen) v v?y khng dng b?t k? s?n ph?m no khc c ch?a Tylenol v?i Norco.  ??n [place] C?p c?u g?n nh?t ngay l?p t?c n?u: ? B?n b? s?t ho?c ?n l?nh ? ?au ng?c ho?c kh th?. ? ?au ??u d? d?i. ? C?m th?y chng m?t ho?c ng?t x?u. ? B?n c b?t k? tri?u ch?ng m?i / lin quan ho?c tr?m tr?ng h?n  Vui lng ??c cc gi thng tin b? sung ?nh km v?i b?n tm t?t xu?t vi?n c?a b?n.  Khng dng thu?c n?u pht ban ng?a, s?ng mi?ng  ho?c mi, ho?c kh th?; g?i 911 v tm ki?m s? ch?m [place] y t? kh?n c?p ngay l?p t?c n?u ?i?u ny x?y ra.  B?n c th? xem l?i ton b? cc bi ki?m tra trong phng th nghi?m v k?t qu? hnh ?nh trn ti kho?n MyChart c?a mnh. Vui lng th?o lu?n ??y ?? t?t c? cc k?t qu? v?i nh cung c?p d?ch v? ch?m [place] chnh c?a b?n v bc s? chuyn khoa khc khi b?n ti khm.  L?u : Cc ph?n c?a v?n b?n ny c th? ? ???c phin m b?ng ph?n m?m nh?n d?ng gi?ng ni. M?i n? l?c ? ???c th?c hi?n ?? ??m b?o tnh chnh xc; tuy nhin, l?i phin m my tnh khng ch?  v?n c th? xu?t hi?n.

## 2019-11-07 NOTE — ED Triage Notes (Signed)
Pt came in via [facility] with c/o SVT. Pt has metastatic lung cancer. On arrival her HR was 180. She converted to NSR with 6mg  Adenosine. Interpreter used for triage Verner Chol)

## 2019-11-07 NOTE — ED Notes (Signed)
Pt also c/o L sided lower back pain

## 2019-11-08 LAB — URINE CULTURE

## 2019-11-09 ENCOUNTER — Telehealth: Payer: Self-pay | Admitting: Medical Oncology

## 2019-11-09 NOTE — Telephone Encounter (Signed)
Increased Pain- IN her back on the  left side.   In the ED she was prescribed Hydrocodone 6 tablets and it has helped . She has 2 left.   She requests a refill for hydrocodone.  Bone Mets -She said she did not know she had bone mets. She was told to to see xrt. Next visit 12/08/19.

## 2019-11-10 ENCOUNTER — Other Ambulatory Visit: Payer: Self-pay | Admitting: Internal Medicine

## 2019-11-10 MED ORDER — HYDROCODONE-ACETAMINOPHEN 5-325 MG PO TABS: 1.0000 | ORAL_TABLET | Freq: Three times a day (TID) | ORAL | 0 refills | Status: DC | PRN

## 2019-11-10 MED FILL — HYDROCODON-APAP 5-325: 5-325 | 10 days supply | Qty: 30 | Fill #0

## 2019-11-10 MED FILL — TAGRISSO 80 MG TABLET: 80 | 30 days supply | Qty: 30 | Fill #7

## 2019-11-12 LAB — CULTURE, BLOOD (SINGLE)
Culture: NO GROWTH
Special Requests: ADEQUATE

## 2019-11-20 ENCOUNTER — Inpatient Hospital Stay (HOSPITAL_COMMUNITY)
Admission: EM | Admit: 2019-11-20 | Discharge: 2019-11-27 | DRG: 644 | Disposition: A | Discharge status: Home / Self Care | Payer: Medicare Other | Attending: Internal Medicine | Admitting: Internal Medicine

## 2019-11-20 ENCOUNTER — Encounter (HOSPITAL_COMMUNITY): Payer: Self-pay | Admitting: Emergency Medicine

## 2019-11-20 DIAGNOSIS — E876 Hypokalemia: Secondary | ICD-10-CM | POA: Diagnosis present

## 2019-11-20 DIAGNOSIS — C3490 Malignant neoplasm of unspecified part of unspecified bronchus or lung: Secondary | ICD-10-CM | POA: Diagnosis present

## 2019-11-20 DIAGNOSIS — E222 Syndrome of inappropriate secretion of antidiuretic hormone: Principal | ICD-10-CM | POA: Diagnosis present

## 2019-11-20 DIAGNOSIS — E872 Acidosis, unspecified: Secondary | ICD-10-CM

## 2019-11-20 DIAGNOSIS — Z881 Allergy status to other antibiotic agents status: Secondary | ICD-10-CM

## 2019-11-20 DIAGNOSIS — E860 Dehydration: Secondary | ICD-10-CM | POA: Diagnosis present

## 2019-11-20 DIAGNOSIS — Y842 Radiological procedure and radiotherapy as the cause of abnormal reaction of the patient, or of later complication, without mention of misadventure at the time of the procedure: Secondary | ICD-10-CM | POA: Diagnosis present

## 2019-11-20 DIAGNOSIS — Z888 Allergy status to other drugs, medicaments and biological substances status: Secondary | ICD-10-CM

## 2019-11-20 DIAGNOSIS — K5900 Constipation, unspecified: Secondary | ICD-10-CM

## 2019-11-20 DIAGNOSIS — Z9221 Personal history of antineoplastic chemotherapy: Secondary | ICD-10-CM

## 2019-11-20 DIAGNOSIS — C7951 Secondary malignant neoplasm of bone: Secondary | ICD-10-CM | POA: Diagnosis present

## 2019-11-20 DIAGNOSIS — E871 Hypo-osmolality and hyponatremia: Secondary | ICD-10-CM | POA: Diagnosis not present

## 2019-11-20 DIAGNOSIS — M549 Dorsalgia, unspecified: Secondary | ICD-10-CM | POA: Diagnosis present

## 2019-11-20 DIAGNOSIS — G40909 Epilepsy, unspecified, not intractable, without status epilepticus: Secondary | ICD-10-CM | POA: Diagnosis present

## 2019-11-20 DIAGNOSIS — R188 Other ascites: Secondary | ICD-10-CM | POA: Diagnosis present

## 2019-11-20 DIAGNOSIS — C7931 Secondary malignant neoplasm of brain: Secondary | ICD-10-CM | POA: Diagnosis present

## 2019-11-20 DIAGNOSIS — R1111 Vomiting without nausea: Secondary | ICD-10-CM

## 2019-11-20 DIAGNOSIS — G893 Neoplasm related pain (acute) (chronic): Secondary | ICD-10-CM | POA: Diagnosis present

## 2019-11-20 DIAGNOSIS — N2889 Other specified disorders of kidney and ureter: Secondary | ICD-10-CM | POA: Diagnosis present

## 2019-11-20 DIAGNOSIS — Z20822 Contact with and (suspected) exposure to covid-19: Secondary | ICD-10-CM | POA: Diagnosis present

## 2019-11-20 DIAGNOSIS — R112 Nausea with vomiting, unspecified: Secondary | ICD-10-CM | POA: Diagnosis present

## 2019-11-20 DIAGNOSIS — T66XXXA Radiation sickness, unspecified, initial encounter: Secondary | ICD-10-CM | POA: Diagnosis present

## 2019-11-20 DIAGNOSIS — E861 Hypovolemia: Secondary | ICD-10-CM | POA: Diagnosis present

## 2019-11-20 DIAGNOSIS — R000 Tachycardia, unspecified: Secondary | ICD-10-CM | POA: Diagnosis present

## 2019-11-20 DIAGNOSIS — E878 Other disorders of electrolyte and fluid balance, not elsewhere classified: Secondary | ICD-10-CM | POA: Diagnosis present

## 2019-11-20 DIAGNOSIS — Z79899 Other long term (current) drug therapy: Secondary | ICD-10-CM

## 2019-11-20 DIAGNOSIS — R64 Cachexia: Secondary | ICD-10-CM | POA: Diagnosis present

## 2019-11-20 DIAGNOSIS — C7970 Secondary malignant neoplasm of unspecified adrenal gland: Secondary | ICD-10-CM | POA: Diagnosis present

## 2019-11-20 DIAGNOSIS — D649 Anemia, unspecified: Secondary | ICD-10-CM | POA: Diagnosis present

## 2019-11-20 DIAGNOSIS — E2740 Unspecified adrenocortical insufficiency: Secondary | ICD-10-CM | POA: Diagnosis present

## 2019-11-20 DIAGNOSIS — Z83438 Family history of other disorder of lipoprotein metabolism and other lipidemia: Secondary | ICD-10-CM

## 2019-11-20 DIAGNOSIS — I700 Atherosclerosis of aorta: Secondary | ICD-10-CM | POA: Diagnosis present

## 2019-11-20 DIAGNOSIS — Z681 Body mass index (BMI) 19 or less, adult: Secondary | ICD-10-CM

## 2019-11-20 DIAGNOSIS — R636 Underweight: Secondary | ICD-10-CM | POA: Diagnosis present

## 2019-11-20 DIAGNOSIS — R042 Hemoptysis: Secondary | ICD-10-CM | POA: Diagnosis present

## 2019-11-20 LAB — I-STAT BETA HCG BLOOD, ED (MC, WL, AP ONLY): I-stat hCG, quantitative: <5 m[IU]/mL (ref <5)

## 2019-11-20 LAB — CBC
HCT: 30.3 % — ABNORMAL LOW (ref 36.0–46.0)
Hemoglobin: 10.8 g/dL — ABNORMAL LOW (ref 12.0–15.0)
MCH: 31.3 pg (ref 26.0–34.0)
MCHC: 35.6 g/dL (ref 30.0–36.0)
MCV: 87.8 fL (ref 80.0–100.0)
Platelets: 332 10*3/uL (ref 150–400)
RBC: 3.45 MIL/uL — ABNORMAL LOW (ref 3.87–5.11)
RDW: 12.8 % (ref 11.5–15.5)
WBC: 6 10*3/uL (ref 4.0–10.5)
nRBC: 0 % (ref 0.0–0.2)

## 2019-11-20 LAB — COMPREHENSIVE METABOLIC PANEL
ALT: 34 U/L (ref 0–44)
AST: 29 U/L (ref 15–41)
Albumin: 3.6 g/dL (ref 3.5–5.0)
Alkaline Phosphatase: 74 U/L (ref 38–126)
Anion gap: 10 (ref 5–15)
BUN: 14 mg/dL (ref 6–20)
CO2: 23 mmol/L (ref 22–32)
Calcium: 8.1 mg/dL — ABNORMAL LOW (ref 8.9–10.3)
Chloride: 80 mmol/L — ABNORMAL LOW (ref 98–111)
Creatinine, Ser: 0.38 mg/dL — ABNORMAL LOW (ref 0.44–1.00)
GFR, Estimated: >60 mL/min (ref >60)
Glucose, Bld: 113 mg/dL — ABNORMAL HIGH (ref 70–99)
Potassium: 4.4 mmol/L (ref 3.5–5.1)
Sodium: 113 mmol/L — CL (ref 135–145)
Total Bilirubin: 0.6 mg/dL (ref 0.3–1.2)
Total Protein: 7.3 g/dL (ref 6.5–8.1)

## 2019-11-20 LAB — LIPASE, BLOOD: Lipase: 34 U/L (ref 11–51)

## 2019-11-20 MED ORDER — MORPHINE SULFATE (PF) 4 MG/ML IV SOLN
4.0000 mg | Freq: Once | INTRAVENOUS | Status: AC
  Administered 2019-11-21: 4 mg via INTRAVENOUS
  Filled 2019-11-20: qty 1

## 2019-11-20 MED ORDER — SODIUM CHLORIDE 0.9 % IV BOLUS
1000.0000 mL | Freq: Once | INTRAVENOUS | Status: AC
  Administered 2019-11-21: 500 mL via INTRAVENOUS

## 2019-11-20 MED ORDER — ONDANSETRON HCL 4 MG/2ML IJ SOLN
4.0000 mg | Freq: Once | INTRAMUSCULAR | Status: AC
  Administered 2019-11-21: 4 mg via INTRAVENOUS
  Filled 2019-11-20: qty 2

## 2019-11-20 NOTE — ED Provider Notes (Signed)
[facility] DEPT Provider Note   CSN: [id number] Arrival date & time: 11/20/19  2252     History Chief Complaint  Patient presents with  . Emesis    Deborah Foster is a 49 y.o. female.  HPI     This is a 49 year old female with a history of metastatic lung cancer, hypokalemia who presents with abdominal pain nausea and vomiting.  Patient reports that she has had a 2-week history of abdominal pain.  She reports that it is in the left lower quadrant nonradiating.  Rates her pain at 3 out of 10.  She has been taking her acid reducer with no relief.  She states multiple times "I just do not feel well and I cannot eat."  She reports constipation.  No bloody emesis or stools.  Has not noted any fevers, chest pain, shortness of breath.  History taken from interpreter.  Past Medical History:  Diagnosis Date  . Bone metastasis ([place])   . Hemoptysis   . Hypokalemia   . lung ca dx'd 07/2014  . Lung mass   . Metastasis to adrenal gland ([place])   . Metastasis to brain ([place])   . Pneumonia   . Radiation 08/23/14-09/07/14   Brain/chest and left hip 30 Gy 12 Fx  . URI (upper respiratory infection) 02-02-2015    Patient Active Problem List   Diagnosis Date Noted  . Lung metastasis ([place]) 04/30/2019  . Goals of care, counseling/discussion 05/05/2018  . Epigastric pain 05/05/2018  . Blurry vision, bilateral 05/05/2018  . 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 Metastasis to supraclavicular lymph node ([place]) 04/08/2018  . Difficulty sleeping 03/20/2018  . Primary cancer of right lower lobe of lung ([place]) 07/22/2015  . Malnutrition of moderate degree 07/21/2015  . Hemoptysis   . Primary lung adenocarcinoma ([place])   . Odynophagia 07/05/2015  . Folic acid deficiency 48/01/6551  . B12 deficiency 06/06/2015  . Radiation pneumonitis with hemoptysis 06/06/2015  . Bone metastases ([place]) 01/20/2015  . Malignant neoplasm metastatic to left adrenal gland  ([place]) 01/20/2015  . Bronchitis 11/22/2014  . Encounter for antineoplastic chemotherapy 11/02/2014  . Mucositis 10/22/2014  . Rash 10/08/2014  . Non-small cell carcinoma of lung, stage 4 ([place]) 08/26/2014  . Bone disease, metabolic 74/82/7078  . Metastasis to brain ([place])   . Palliative care encounter   . Adrenal mass, left ([place])   . CAP (community acquired pneumonia) 08/07/2014  . Pneumonia 08/07/2014  . Lung mass 08/07/2014    Past Surgical History:  Procedure Laterality Date  . IR GENERIC HISTORICAL  07/14/2015   IR RADIOLOGIST EVAL & MGMT 07/14/2015 Greggory Keen, MD [facility] INTERV RAD  . VIDEO BRONCHOSCOPY Bilateral 08/09/2014   Procedure: VIDEO BRONCHOSCOPY WITHOUT FLUORO;  Surgeon: [name], MD;  Location: [facility] ENDOSCOPY;  Service: Cardiopulmonary;  Laterality: Bilateral;  . VIDEO BRONCHOSCOPY Bilateral 07/06/2015   Procedure: VIDEO BRONCHOSCOPY WITHOUT FLUORO;  Surgeon: [name], MD;  Location: [facility] ENDOSCOPY;  Service: Cardiopulmonary;  Laterality: Bilateral;     OB History   No obstetric history on file.     Family History  Problem Relation Age of Onset  . Hyperlipidemia Mother     Social History   Tobacco Use  . Smoking status: Never Smoker  . Smokeless tobacco: Never Used  Vaping Use  . Vaping Use: Never used  Substance Use Topics  . Alcohol use: No    Alcohol/week: 0.0 standard drinks  . Drug use: No  Home Medications Prior to Admission medications   Medication Sig Start Date End Date Taking? Authorizing Provider  AMBULATORY NON FORMULARY MEDICATION Medication Name: MAGIC MOUTHWASH 2 % Viscous Lidocaine  Maalox Benadryl Susp.  Disp: 1:1:1 226ml bottle Sig: 47ml PO Swish & Spit  q 3-4hrs. PRN mouth sores Patient not taking: Reported on 11/07/2019 07/05/16   [name], MD  Calcium Carbonate-Vitamin D (CALCIUM 500 + D) 500-125 MG-UNIT TABS Take 500 mg by mouth 2 (two) times daily.    [provider]  clobetasol cream  (TEMOVATE) 4.33 % Apply 1 application topically 2 (two) times daily.    [provider]  Cyanocobalamin (VITAMIN B-12) 2500 MCG SUBL Place 2,500 mcg under the tongue daily.    [provider]  ENSURE (ENSURE) Take 1 Can by mouth 2 (two) times daily between meals.     [provider]  ferrous sulfate 325 (65 FE) MG EC tablet Take 325 mg by mouth daily with breakfast.    [provider]  fluticasone (VERAMYST) 27.5 MCG/SPRAY nasal spray Place 2 sprays into the nose daily.    [provider]  folic acid (FOLVITE) 295 MCG tablet Take 1 tablet (800 mcg total) by mouth daily. 07/28/15   [name], MD  HYDROcodone-acetaminophen (NORCO/VICODIN) 5-325 MG tablet Take 1 tablet by mouth 3 (three) times daily as needed. 11/10/19   [name], MD  levETIRAcetam (KEPPRA) 250 MG tablet Take 1 tablet (250 mg total) by mouth 2 (two) times daily. 07/02/19   Ventura Sellers, MD  loratadine (CLARITIN) 10 MG tablet Take 1 tablet (10 mg total) by mouth daily. 04/30/16   [name], MD  mirtazapine (REMERON) 15 MG tablet Take 1 tablet (15 mg total) by mouth at bedtime. 03/10/19   Heilingoetter, Cassandra L, PA-C  Nutritional Supplements (JUICE PLUS FIBRE PO) Take 1 capsule by mouth daily. Reported on 07/28/2015    [provider]  ondansetron (ZOFRAN) 8 MG tablet Take 8 mg by mouth every 8 (eight) hours as needed. 06/30/19   [provider]  OVER THE COUNTER MEDICATION "Deep penetrating pain relief oil"    [provider]  prochlorperazine (COMPAZINE) 10 MG tablet TAKE 1 TABLET BY MOUTH EVERY 6 HOURS AS NEEDED FOR NAUSEA OR VOMITING. Patient taking differently: Take 10 mg by mouth every 6 (six) hours as needed for nausea. TAKE 1 TABLET BY MOUTH EVERY 6 HOURS AS NEEDED FOR NAUSEA OR VOMITING. 03/10/19   Heilingoetter, Cassandra L, PA-C  ranitidine (ZANTAC) 300 MG tablet Take 1 tablet (300 mg total) by mouth at bedtime. 12/06/15   Mauri Pole, MD  rivaroxaban (XARELTO) 20 MG TABS tablet TAKE 1 TABLET (20 MG TOTAL) BY MOUTH DAILY WITH SUPPER. Patient not taking: Reported on 11/07/2019 06/02/19   [name], MD  TAGRISSO 80 MG tablet TAKE 1 TABLET (80 MG TOTAL) BY MOUTH DAILY. 04/28/19   [name], MD  TURMERIC PO Take 1 capsule by mouth 2 (two) times daily. Turmeric Superior 750 mg po bid    [provider]  XALKORI 250 MG capsule TAKE 1 CAPSULE BY MOUTH 2 TIMES DAILY. Patient not taking: Reported on 11/07/2019 06/23/19   [name], MD    Allergies    Other and Doxycycline  Review of Systems   Review of Systems  Constitutional: Negative for fever.  Respiratory: Negative for shortness of breath.   Cardiovascular: Negative for chest pain.  Gastrointestinal: Positive for abdominal pain, constipation, nausea and vomiting. Negative for blood  in stool.  Genitourinary: Negative for dysuria.  All other systems reviewed and are negative.   Physical Exam Updated Vital Signs BP 113/70 (BP Location: Right Arm)   Pulse 99   Temp 97.7 F (36.5 C) (Oral)   Resp 18   SpO2 100%   Physical Exam Vitals and nursing note reviewed.  Constitutional:      Appearance: She is well-developed.     Comments: Chronically ill-appearing, appears older than stated age  HENT:     Head: Normocephalic and atraumatic.     Mouth/Throat:     Mouth: Mucous membranes are dry.  Eyes:     Pupils: Pupils are equal, round, and reactive to light.  Cardiovascular:     Rate and Rhythm: Normal rate and regular rhythm.     Heart sounds: Normal heart sounds.  Pulmonary:     Effort: Pulmonary effort is normal. No respiratory distress.     Breath sounds: No wheezing.  Abdominal:     General: Bowel sounds are normal.     Palpations: Abdomen is soft.     Tenderness: There is abdominal tenderness.     Comments: Left lower quadrant tenderness palpation, no rebound, voluntary guarding  Musculoskeletal:     Cervical back:  Neck supple.     Right lower leg: No edema.     Left lower leg: No edema.  Skin:    General: Skin is warm and dry.  Neurological:     Mental Status: She is alert and oriented to person, place, and time.  Psychiatric:     Comments: Flat affect     ED Results / Procedures / Treatments   Labs (all labs ordered are listed, but only abnormal results are displayed) Labs Reviewed  COMPREHENSIVE METABOLIC PANEL - Abnormal; Notable for the following components:      Result Value   Sodium 113 (*)    Chloride 80 (*)    Glucose, Bld 113 (*)    Creatinine, Ser 0.38 (*)    Calcium 8.1 (*)    All other components within normal limits  CBC - Abnormal; Notable for the following components:   RBC 3.45 (*)    Hemoglobin 10.8 (*)    HCT 30.3 (*)    All other components within normal limits  URINALYSIS, ROUTINE W REFLEX MICROSCOPIC - Abnormal; Notable for the following components:   APPearance HAZY (*)    Ketones, ur 20 (*)    All other components within normal limits  LIPASE, BLOOD  CREATININE, URINE, RANDOM  SODIUM, URINE, RANDOM  I-STAT BETA HCG BLOOD, ED ([facility], [facility], [facility] ONLY)    EKG None  Radiology CT ABDOMEN PELVIS W CONTRAST  Result Date: 11/21/2019 CLINICAL DATA:  Known lung cancer, on oral chemotherapy, emesis since since yesterday EXAM: CT ABDOMEN AND PELVIS WITH CONTRAST TECHNIQUE: Multidetector CT imaging of the abdomen and pelvis was performed using the standard protocol following bolus administration of intravenous contrast. CONTRAST:  89mL OMNIPAQUE IOHEXOL 300 MG/ML  SOLN COMPARISON:  CT chest, abdomen and pelvis 11/07/2019 FINDINGS: Lower chest: Fairly consolidated region of lung in the medial right lung base with some features of post treatment related effect and fibrosis albeit with several fluid-filled airways in the non aerated portion of the lung. Small volume right pleural fluid is noted as well. Left base is clear. Stable sclerotic focus in lateral right sixth rib. Normal  heart size. No pericardial effusion. Trace pericardial fluid is unchanged from prior. Hepatobiliary: Redemonstrated bandlike region of parenchymal hypoattenuation  extending throughout the right lobe liver. Several scattered subcentimeter hypoattenuating foci are present throughout the liver, also unchanged from priors. Some mild intra and extrahepatic biliary ductal dilatation is similar to comparison studies without visible intraductal gallstones or other filling defect evident. Gallbladder Lea partially distended without significant wall thickening but with some fluid localized to the porta hepatis gallbladder fossa. Pancreas: Redemonstrated patchy enhancement of the pancreas with a slightly lobular, thickened appearance albeit without focal lesion or pancreatic ductal dilatation. Some surrounding stranding seen in the adjacent retroperitoneum. Spleen: Diminutive spleen. No focal lesion. Small volume perisplenic free fluid seen posteriorly. Adrenals/Urinary Tract: Normal adrenal glands. Increasingly conspicuous hypoattenuation of the upper poles and medial kidneys bilaterally. Additionally, there is some mild bilateral urothelial thickening, left slightly greater than right. No focal liver lesion is seen. No urolithiasis. Urinary bladder is largely decompressed at the time of exam and therefore poorly evaluated by CT imaging. Some mild bladder wall thickening is nonspecific. Stomach/Bowel: Distal esophagus is unremarkable. Some thickening of the gastric antrum likely related to normal peristalsis. Some slight mucosal hyperemia throughout the first portion of the duodenum is nonspecific and similar to the comparison exam. Some adjacent inflammatory changes may be redistributed. No other small bowel thickening or conspicuous enhancement. No dilatation or evidence of obstruction. A normal appendix is visualized. No colonic dilatation or wall thickening. Vascular/Lymphatic: Atherosclerotic calcifications within the  abdominal aorta and branch vessels. No aneurysm or ectasia. No enlarged abdominopelvic lymph nodes. Some reactive appearing and possibly edematous nodes in the upper abdomen. Reproductive: Slight prominence of the left parametrial and gonadal vessels. Otherwise normal appearance of the anteverted uterus without other concerning adnexal abnormality. Other: Increasing upper abdominal edematous changes predominantly centered in the central mesentery and retroperitoneum. No free air. No bowel containing hernia. Low-attenuation ascites similar to only minimally increased in volume from prior with some redistribution including to the subphrenic spaces. Musculoskeletal: Redemonstration of the diffuse osseous metastatic disease including sclerotic foci in the spine and more expansile lytic and sclerotic appearance of the left ilium and acetabulum. IMPRESSION: 1. Hypoattenuation of the upper poles and medial kidneys bilaterally with some mild bilateral urothelial thickening, left slightly greater than right. Could be related to radiation related effects though could correlate with urinalysis to exclude urinary tract infection. 2. Redemonstrated patchy enhancement of the pancreas with a slightly lobular, thickened appearance albeit without focal lesion or pancreatic ductal dilatation. Some surrounding stranding seen in the adjacent retroperitoneum. While this could reflect radiation related changes a pancreatitis could present similarly. Correlate with lipase. 3. Slight mucosal hyperemia throughout the first portion of the duodenum is nonspecific and similar to the comparison exam. Possibly within a radiation field as well with additional inflammatory changes in the upper abdomen and retroperitoneum. 4. Low-attenuation ascites similar to only minimally increased in volume from prior with some redistribution including to the subphrenic spaces. 5. Fairly consolidated region of lung in the medial right lung base with some  features of post treatment related effect and fibrosis albeit with several fluid-filled airways in the non aerated portion of the lung. Findings are favored to reflect post treatment related effect though superimposed aspiration may be present. 6. Slight prominence of the left parametrial and gonadal vessels, which can be seen with pelvic congestion syndrome in the appropriate clinical setting. 7. Aortic Atherosclerosis (ICD10-I70.0). Electronically Signed   By: Lovena Renn M.D.   On: 11/21/2019 02:00    Procedures Procedures (including critical care time)  CRITICAL CARE Performed by: Merryl Hacker  Total critical care time: 35 minutes  Critical care time was exclusive of separately billable procedures and treating other patients.  Critical care was necessary to treat or prevent imminent or life-threatening deterioration.  Critical care was time spent personally by me on the following activities: development of treatment plan with patient and/or surrogate as well as nursing, discussions with consultants, evaluation of patient's response to treatment, examination of patient, obtaining history from patient or surrogate, ordering and performing treatments and interventions, ordering and review of laboratory studies, ordering and review of radiographic studies, pulse oximetry and re-evaluation of patient's condition.   Medications Ordered in ED Medications  sodium chloride (PF) 0.9 % injection (has no administration in time range)  sodium chloride 0.9 % bolus 1,000 mL (500 mLs Intravenous New Bag/Given 11/21/19 0011)  ondansetron (ZOFRAN) injection 4 mg (4 mg Intravenous Given 11/21/19 0003)  morphine 4 MG/ML injection 4 mg (4 mg Intravenous Given 11/21/19 0005)  iohexol (OMNIPAQUE) 300 MG/ML solution 75 mL (75 mLs Intravenous Contrast Given 11/21/19 0123)    ED Course  I have reviewed the triage vital signs and the nursing notes.  Pertinent labs & imaging results that were available  during my care of the patient were reviewed by me and considered in my medical decision making (see chart for details).    MDM Rules/Calculators/A&P                          Patient with history of metastatic cancer presents with nausea, vomiting, abdominal pain.  Reports several week history of the same.  She is overall nontoxic-appearing.  She is chronically ill-appearing vital signs are reassuring.  She has some left lower quadrant tenderness to palpation on exam.  She is clinically dry appearing.  Fluids, pain and nausea medication were ordered.  Lab testing initiated.  No significant leukocytosis.  Metabolic panel notable for sodium of 113, chloride of 80.  No significant anion gap.  Patient was initially given 1 L of normal saline.  I reduce this to 500 cc and started on 100 cc/h in order to not correct too quickly.  Abdominal CT obtained to rule out new intra-abdominal process.  This has multiple incidental findings but none that correlate clinically.  Urinalysis and lipase are reassuring.  She has known metastatic disease and SIADH could also be a consideration.  Regardless she will need admission to the hospital for correction and further evaluation.  Serum osmolality and urine studies are pending.  Covid testing pending.  Final Clinical Impression(s) / ED Diagnoses Final diagnoses:  Hyponatremia  Dehydration  Non-intractable vomiting without nausea, unspecified vomiting type    Rx / DC Orders ED Discharge Orders    None       Merryl Hacker, MD 11/21/19 [phone]

## 2019-11-20 NOTE — ED Triage Notes (Signed)
Per EMS, patient from home, vomiting since yesterday. Hx lung cancer. Patient reports taking oral chemo.

## 2019-11-21 ENCOUNTER — Emergency Department (HOSPITAL_COMMUNITY): Payer: Medicare Other

## 2019-11-21 ENCOUNTER — Encounter (HOSPITAL_COMMUNITY): Payer: Self-pay

## 2019-11-21 ENCOUNTER — Other Ambulatory Visit: Payer: Self-pay

## 2019-11-21 DIAGNOSIS — E871 Hypo-osmolality and hyponatremia: Secondary | ICD-10-CM | POA: Diagnosis present

## 2019-11-21 DIAGNOSIS — Z20822 Contact with and (suspected) exposure to covid-19: Secondary | ICD-10-CM | POA: Diagnosis present

## 2019-11-21 DIAGNOSIS — C3490 Malignant neoplasm of unspecified part of unspecified bronchus or lung: Secondary | ICD-10-CM

## 2019-11-21 DIAGNOSIS — E878 Other disorders of electrolyte and fluid balance, not elsewhere classified: Secondary | ICD-10-CM | POA: Diagnosis present

## 2019-11-21 DIAGNOSIS — C7931 Secondary malignant neoplasm of brain: Secondary | ICD-10-CM | POA: Diagnosis present

## 2019-11-21 DIAGNOSIS — M549 Dorsalgia, unspecified: Secondary | ICD-10-CM | POA: Diagnosis present

## 2019-11-21 DIAGNOSIS — R188 Other ascites: Secondary | ICD-10-CM | POA: Diagnosis present

## 2019-11-21 DIAGNOSIS — C7951 Secondary malignant neoplasm of bone: Secondary | ICD-10-CM | POA: Diagnosis present

## 2019-11-21 DIAGNOSIS — Y842 Radiological procedure and radiotherapy as the cause of abnormal reaction of the patient, or of later complication, without mention of misadventure at the time of the procedure: Secondary | ICD-10-CM | POA: Diagnosis present

## 2019-11-21 DIAGNOSIS — E222 Syndrome of inappropriate secretion of antidiuretic hormone: Secondary | ICD-10-CM | POA: Diagnosis present

## 2019-11-21 DIAGNOSIS — R042 Hemoptysis: Secondary | ICD-10-CM | POA: Diagnosis present

## 2019-11-21 DIAGNOSIS — C7970 Secondary malignant neoplasm of unspecified adrenal gland: Secondary | ICD-10-CM | POA: Diagnosis present

## 2019-11-21 DIAGNOSIS — E872 Acidosis: Secondary | ICD-10-CM | POA: Diagnosis present

## 2019-11-21 DIAGNOSIS — Z681 Body mass index (BMI) 19 or less, adult: Secondary | ICD-10-CM | POA: Diagnosis not present

## 2019-11-21 DIAGNOSIS — R112 Nausea with vomiting, unspecified: Secondary | ICD-10-CM | POA: Diagnosis present

## 2019-11-21 DIAGNOSIS — R64 Cachexia: Secondary | ICD-10-CM | POA: Diagnosis present

## 2019-11-21 DIAGNOSIS — E2740 Unspecified adrenocortical insufficiency: Secondary | ICD-10-CM | POA: Diagnosis present

## 2019-11-21 DIAGNOSIS — E860 Dehydration: Secondary | ICD-10-CM | POA: Diagnosis present

## 2019-11-21 DIAGNOSIS — Z9221 Personal history of antineoplastic chemotherapy: Secondary | ICD-10-CM | POA: Diagnosis not present

## 2019-11-21 DIAGNOSIS — R000 Tachycardia, unspecified: Secondary | ICD-10-CM | POA: Diagnosis present

## 2019-11-21 DIAGNOSIS — E861 Hypovolemia: Secondary | ICD-10-CM | POA: Diagnosis present

## 2019-11-21 DIAGNOSIS — R109 Unspecified abdominal pain: Secondary | ICD-10-CM | POA: Diagnosis not present

## 2019-11-21 DIAGNOSIS — G893 Neoplasm related pain (acute) (chronic): Secondary | ICD-10-CM | POA: Diagnosis present

## 2019-11-21 DIAGNOSIS — G40909 Epilepsy, unspecified, not intractable, without status epilepticus: Secondary | ICD-10-CM | POA: Diagnosis present

## 2019-11-21 DIAGNOSIS — E876 Hypokalemia: Secondary | ICD-10-CM | POA: Diagnosis present

## 2019-11-21 DIAGNOSIS — K5900 Constipation, unspecified: Secondary | ICD-10-CM | POA: Diagnosis present

## 2019-11-21 LAB — CBC
HCT: 31.2 % — ABNORMAL LOW (ref 36.0–46.0)
Hemoglobin: 10.8 g/dL — ABNORMAL LOW (ref 12.0–15.0)
MCH: 30.2 pg (ref 26.0–34.0)
MCHC: 34.6 g/dL (ref 30.0–36.0)
MCV: 87.2 fL (ref 80.0–100.0)
Platelets: 339 10*3/uL (ref 150–400)
RBC: 3.58 MIL/uL — ABNORMAL LOW (ref 3.87–5.11)
RDW: 12.9 % (ref 11.5–15.5)
WBC: 7.2 10*3/uL (ref 4.0–10.5)
nRBC: 0 % (ref 0.0–0.2)

## 2019-11-21 LAB — URINALYSIS, ROUTINE W REFLEX MICROSCOPIC
Bilirubin Urine: NEGATIVE
Glucose, UA: NEGATIVE mg/dL
Hgb urine dipstick: NEGATIVE
Ketones, ur: 20 mg/dL — AB
Leukocytes,Ua: NEGATIVE
Nitrite: NEGATIVE
Protein, ur: NEGATIVE mg/dL
Specific Gravity, Urine: 1.017 (ref 1.005–1.030)
pH: 7 (ref 5.0–8.0)

## 2019-11-21 LAB — BASIC METABOLIC PANEL
Anion gap: 11 (ref 5–15)
Anion gap: 8 (ref 5–15)
Anion gap: 7 (ref 5–15)
BUN: 9 mg/dL (ref 6–20)
BUN: 7 mg/dL (ref 6–20)
BUN: 9 mg/dL (ref 6–20)
CO2: 21 mmol/L — ABNORMAL LOW (ref 22–32)
CO2: 22 mmol/L (ref 22–32)
CO2: 21 mmol/L — ABNORMAL LOW (ref 22–32)
Calcium: 7.5 mg/dL — ABNORMAL LOW (ref 8.9–10.3)
Calcium: 7.2 mg/dL — ABNORMAL LOW (ref 8.9–10.3)
Calcium: 6.2 mg/dL — CL (ref 8.9–10.3)
Chloride: 83 mmol/L — ABNORMAL LOW (ref 98–111)
Chloride: 85 mmol/L — ABNORMAL LOW (ref 98–111)
Chloride: 90 mmol/L — ABNORMAL LOW (ref 98–111)
Creatinine, Ser: 0.36 mg/dL — ABNORMAL LOW (ref 0.44–1.00)
Creatinine, Ser: 0.5 mg/dL (ref 0.44–1.00)
Creatinine, Ser: 0.38 mg/dL — ABNORMAL LOW (ref 0.44–1.00)
GFR, Estimated: >60 mL/min (ref >60)
GFR, Estimated: >60 mL/min (ref >60)
GFR, Estimated: >60 mL/min (ref >60)
Glucose, Bld: 99 mg/dL (ref 70–99)
Glucose, Bld: 86 mg/dL (ref 70–99)
Glucose, Bld: 98 mg/dL (ref 70–99)
Potassium: 4.4 mmol/L (ref 3.5–5.1)
Potassium: 4.8 mmol/L (ref 3.5–5.1)
Potassium: 3.9 mmol/L (ref 3.5–5.1)
Sodium: 115 mmol/L — CL (ref 135–145)
Sodium: 115 mmol/L — CL (ref 135–145)
Sodium: 118 mmol/L — CL (ref 135–145)

## 2019-11-21 LAB — OSMOLALITY: Osmolality: 244 mOsm/kg — CL (ref 275–295)

## 2019-11-21 LAB — SODIUM, URINE, RANDOM: Sodium, Ur: 110 mmol/L

## 2019-11-21 LAB — CREATININE, URINE, RANDOM: Creatinine, Urine: 53.88 mg/dL

## 2019-11-21 LAB — RESPIRATORY PANEL BY RT PCR (FLU A&B, COVID)
Influenza A by PCR: NEGATIVE
Influenza B by PCR: NEGATIVE
SARS Coronavirus 2 by RT PCR: NEGATIVE

## 2019-11-21 LAB — HIV ANTIBODY (ROUTINE TESTING W REFLEX): HIV Screen 4th Generation wRfx: NONREACTIVE

## 2019-11-21 MED ORDER — IOHEXOL 300 MG/ML  SOLN
75.0000 mL | Freq: Once | INTRAMUSCULAR | Status: AC | PRN
  Administered 2019-11-21: 75 mL via INTRAVENOUS

## 2019-11-21 MED ORDER — ONDANSETRON HCL 4 MG PO TABS: 4.0000 mg | ORAL_TABLET | Freq: Four times a day (QID) | ORAL | Status: DC | PRN

## 2019-11-21 MED ORDER — ENOXAPARIN SODIUM 40 MG/0.4ML ~~LOC~~ SOLN
40.0000 mg | SUBCUTANEOUS | Status: DC
  Administered 2019-11-21 – 2019-11-22 (×2): 40 mg via SUBCUTANEOUS
  Filled 2019-11-21 (×2): qty 0.4

## 2019-11-21 MED ORDER — SODIUM CHLORIDE (PF) 0.9 % IJ SOLN
INTRAMUSCULAR | Status: AC
  Filled 2019-11-21: qty 50

## 2019-11-21 MED ORDER — HYDROMORPHONE HCL 1 MG/ML IJ SOLN
1.0000 mg | INTRAMUSCULAR | Status: DC | PRN
  Administered 2019-11-21 – 2019-11-26 (×5): 1 mg via INTRAVENOUS
  Filled 2019-11-21 (×6): qty 1

## 2019-11-21 MED ORDER — CALCIUM GLUCONATE-NACL 1-0.675 GM/50ML-% IV SOLN: 1.0000 g | Freq: Once | INTRAVENOUS | Status: DC

## 2019-11-21 MED ORDER — SODIUM CHLORIDE 0.9 % IV SOLN: INTRAVENOUS | Status: DC

## 2019-11-21 MED ORDER — SODIUM CHLORIDE 0.9 % IV BOLUS
500.0000 mL | Freq: Once | INTRAVENOUS | Status: AC
  Administered 2019-11-21: 500 mL via INTRAVENOUS

## 2019-11-21 MED ORDER — HYDROCODONE-ACETAMINOPHEN 5-325 MG PO TABS
1.0000 | ORAL_TABLET | ORAL | Status: DC | PRN
  Administered 2019-11-21 (×2): 1 via ORAL
  Administered 2019-11-21 – 2019-11-25 (×12): 2 via ORAL
  Administered 2019-11-25 – 2019-11-26 (×7): 1 via ORAL
  Administered 2019-11-26: 2 via ORAL
  Administered 2019-11-26 – 2019-11-27 (×3): 1 via ORAL
  Administered 2019-11-27: 2 via ORAL
  Filled 2019-11-21 (×2): qty 2
  Filled 2019-11-21 (×2): qty 1
  Filled 2019-11-21 (×5): qty 2
  Filled 2019-11-21: qty 1
  Filled 2019-11-21 (×3): qty 2
  Filled 2019-11-21: qty 1
  Filled 2019-11-21: qty 2
  Filled 2019-11-21: qty 1
  Filled 2019-11-21: qty 2
  Filled 2019-11-21: qty 1
  Filled 2019-11-21: qty 2
  Filled 2019-11-21 (×2): qty 1
  Filled 2019-11-21 (×3): qty 2
  Filled 2019-11-21: qty 1
  Filled 2019-11-21: qty 2

## 2019-11-21 MED ORDER — ONDANSETRON HCL 4 MG/2ML IJ SOLN
4.0000 mg | Freq: Four times a day (QID) | INTRAMUSCULAR | Status: DC | PRN
  Administered 2019-11-21 (×2): 4 mg via INTRAVENOUS
  Filled 2019-11-21 (×2): qty 2

## 2019-11-21 MED ORDER — ACETAMINOPHEN 650 MG RE SUPP: 650.0000 mg | Freq: Four times a day (QID) | RECTAL | Status: DC | PRN

## 2019-11-21 MED ORDER — LEVETIRACETAM 250 MG PO TABS
250.0000 mg | ORAL_TABLET | Freq: Two times a day (BID) | ORAL | Status: DC
  Administered 2019-11-21 – 2019-11-23 (×4): 250 mg via ORAL
  Filled 2019-11-21 (×4): qty 1

## 2019-11-21 MED ORDER — ACETAMINOPHEN 325 MG PO TABS: 650.0000 mg | ORAL_TABLET | Freq: Four times a day (QID) | ORAL | Status: DC | PRN

## 2019-11-21 MED ORDER — CALCIUM GLUCONATE-NACL 2-0.675 GM/100ML-% IV SOLN
2.0000 g | Freq: Once | INTRAVENOUS | Status: AC
  Administered 2019-11-21: 2000 mg via INTRAVENOUS
  Filled 2019-11-21 (×2): qty 100

## 2019-11-21 NOTE — ED Notes (Signed)
Date and time results received: 11/21/19 0815 (use smartphrase ".now" to insert current time)  Test: Sodium Critical Value: 115  Name of Provider Notified: Dr. Alfredia Ferguson  Orders Received? Or Actions Taken?: Orders Received - See Orders for details

## 2019-11-21 NOTE — Progress Notes (Signed)
Care started prior to midnight in the emergency room and patient was admitted early this morning after midnight by Dr. Bunnie Pion current agreement with his assessment and plan.  Additional changes to the plan of care been made accordingly.  The patient is a 49 year old [place] speaking female with a past medical history significant for but not limited to metastatic lung cancer metastasis to the brain, metastasis to the adrenal gland, bone metastasis and other comorbidities including but not limited to hemoptysis, history of upper respiratory infection history of radiation to the brain and chest and left hip as well as other comorbidities who sees Dr. Julien Nordmann as an outpatient who presents to the ED with intractable nausea, vomiting and back pain.  She started having vomiting with episodic left lower quadrant pain and was unable to drink and eat well because of the worsening nausea vomiting and feels very weak and tired.  She reported that her abdominal pain was resolved at the time of her admission but she was having 8 out of 10 pain in her back.  She complains of pain in the right rib.  Denies any fever, chills, shortness of breath or any other symptoms associated.  On arrival to the ED she is found to have a temperature of 97.7 and blood work showed that she had a serum glucose of 113.  Is started on normal saline, given IV Zofran and IV morphine is being admitted for the following but not limited to:  Hyponatremia/Hypochloremia  -Sodium level was 113 on arrival to the hospital.  Now slowly improving and is 118 -Continue IV normal saline at the rate of 100 mL/h.   -Continue to monitor BMP every 4 hour.   -Goal is to correct sodium about 8 to 10 mEq in 24 hours.   -Hyponatremia with hypochloremia is most likely secondary to dehydration and poor oral intake in the setting of severe nausea and vomiting.   -Urine Osm not done but Serum Som was 244 -Urine Sodium was 110 and  -Continue to monitor  carefully  Non-Small Cell Carcinoma of Lung, stage 4 (HCC) -Patient is s/p chemotherapy.   -Cancer is already metastasized to brain, bones and adrenals.   -Pain medications according to the pain scales ordered  Sinus Tachycardia -Patient's HR is elevated in the setting of Pain and Dehydration -C/w IVF and gave a bolus of NS at 500 mL x1; Already received a 1 Liter NS bolus -Continue to Monitor on Telemetry   Nausea and Vomiting -Nausea and vomiting is improved at this time.   -IV Zofran every 6 hours as needed for nausea and vomiting -Diet Started to FULL Liquid DIet  Back Pain -Tylenol as needed for mild pain, Norco as needed for moderate pain while Dilaudid as needed for severe pain ordered.  Hypocalcemia -Patient's Ca2+ is now 6.2 -Replete with IV Calcium Gluconate 2 grams -Check Ionized Calcium -Check Albumin Level in the AM for Calcium Correction -Continue to Monitor and repeat Ca2+ in the AM   Metabolic Acidosis -Mild. CO2 was 21, Chloride Level was 90, AG was 7 -C/w IVF as above -Continue to Monitor and Repeat CMP in the AM   Normocytic Anemia -Patient's Hgb/Hct is now 10.8/31.2 -Check Anemia Panel in the AM -Continue to Monitor for S/Sx of Bleeding; Currently no overt bleeding noted -Repeat CBC in the AM   We will continue to monitor patient's clinical response to intervention and repeat blood work in the morning.  We will advance her diet slowly and continue  monitor sodium trend.

## 2019-11-21 NOTE — ED Notes (Signed)
Date and time results received: 11/21/19 1:00 PM    Test: Sodium and Calcium  Critical Value: 118 and 6.2  Name of Provider Notified: Alfredia Ferguson  Orders Received? Or Actions Taken?: N/A

## 2019-11-21 NOTE — Progress Notes (Signed)
Mews score yellow with 1800 vitals (previous shift)- protocol implemented with vs frequency increased at 2000 and 2200, ordered fluids hung as soon as calcium gluconate completed, pt asymptomatic and HR is actually improved from the ED (was in the 120's to 130's), BP soft but patient not feeling dizzy or lightheaded, ambulated to the bathroom x2 so far for me with steady gait, on-call provider notified via [facility] text page, will check for any new orders and continue to monitor patient, central tele also monitoring.  tele also monitoring.

## 2019-11-21 NOTE — ED Notes (Addendum)
Date and time results received: 11/21/19 11/21/19 (use smartphrase ".now" to insert current time)  Test: Osmolality 244 Critical Value: 244  Name of Provider Notified: Alfredia Ferguson  Orders Received? Or Actions Taken?: Actions Taken: provider notified   **I Was not notified as I was not on Service for my shift as it begins at 0700. Night Time Provider should have been called.

## 2019-11-21 NOTE — ED Notes (Signed)
NS started at 100cc/hour per provider order

## 2019-11-21 NOTE — H&P (Signed)
History and Physical    Deborah Foster CWC:[id number] DOB: 12/30/1970 DOA: 11/20/2019  PCP: [name], MD (Confirm with patient/family/NH records and if not entered, this has to be entered at [facility] point of entry) Patient coming from: Home  I have personally briefly reviewed patient's old medical records in [place]  Chief Complaint: Intractable nausea, vomiting and back pain  HPI: Deborah Foster is a 49 y.o. female with medical history significant of metastatic lung cancer resented to ED for evaluation of intractable nausea and vomiting with episodic left lower quadrant pain.  Patient states that she is unable to eat and drink well because of worsening nausea and vomiting and feeling very weak and tired.  Patient reports that her abdominal pain is resolved at this time but she is having 8 out of 10 pain in her back.  Patient otherwise denies fever, chills, chest pain, shortness of breath, hematemesis, hematochezia and urinary symptoms.  (For level 3, the HPI must include 4+ descriptors: Location, Quality, Severity, Duration, Timing, Context, modifying factors, associated signs/symptoms and/or status of 3+ chronic problems.)  (Please avoid self-populating past medical history here) (The initial 2-3 lines should be focused and good to copy and paste in the HPI section of the daily progress note).  ED Course: On arrival to ED her temperature was 97.7, blood pressure 113/70, respiratory rate 18 and oxygen saturation 100% on room air.  Blood work showed WBCs 6, hemoglobin 10.8, sodium 113, potassium 4.4, chloride 80, BUN 14, creatinine 0.3 and blood glucose 113.  In ED patient was managed with IV normal saline, IV Zofran and IV morphine.  Review of Systems: As per HPI otherwise 10 point review of systems negative.  Unacceptable ROS statements: "10 systems reviewed," "Extensive" (without elaboration).  Acceptable ROS statements: "All others negative," "All others reviewed and are negative,"  and "All others unremarkable," with at [place] documented Can't double dip - if using for HPI can't use for ROS  Past Medical History:  Diagnosis Date  . Bone metastasis ([place])   . Hemoptysis   . Hypokalemia   . lung ca dx'd 07/2014  . Lung mass   . Metastasis to adrenal gland ([place])   . Metastasis to brain ([place])   . Pneumonia   . Radiation 08/23/14-09/07/14   Brain/chest and left hip 30 Gy 12 Fx  . URI (upper respiratory infection) 02-02-2015    Past Surgical History:  Procedure Laterality Date  . IR GENERIC HISTORICAL  07/14/2015   IR RADIOLOGIST EVAL & MGMT 07/14/2015 Greggory Keen, MD [facility] INTERV RAD  . VIDEO BRONCHOSCOPY Bilateral 08/09/2014   Procedure: VIDEO BRONCHOSCOPY WITHOUT FLUORO;  Surgeon: [name], MD;  Location: [facility] ENDOSCOPY;  Service: Cardiopulmonary;  Laterality: Bilateral;  . VIDEO BRONCHOSCOPY Bilateral 07/06/2015   Procedure: VIDEO BRONCHOSCOPY WITHOUT FLUORO;  Surgeon: [name], MD;  Location: [facility] ENDOSCOPY;  Service: Cardiopulmonary;  Laterality: Bilateral;     reports that she has never smoked. She has never used smokeless tobacco. She reports that she does not drink alcohol and does not use drugs.  Allergies  Allergen Reactions  . Other Other (See Comments)    Patient states that there is some she takes that makes her throat "cry" AND SCRATCHY   . Doxycycline Nausea And Vomiting    Family History  Problem Relation Age of Onset  . Hyperlipidemia Mother     Unacceptable: Noncontributory, unremarkable, or negative. Acceptable: (example)Family history negative for heart disease  Prior to  Admission medications   Medication Sig Start Date End Date Taking? Authorizing Provider  AMBULATORY NON FORMULARY MEDICATION Medication Name: MAGIC MOUTHWASH 2 % Viscous Lidocaine  Maalox Benadryl Susp.  Disp: 1:1:1 222ml bottle Sig: 69ml PO Swish & Spit  q 3-4hrs. PRN mouth sores Patient not taking: Reported on 11/07/2019 07/05/16   [name], MD  Calcium Carbonate-Vitamin D (CALCIUM 500 + D) 500-125 MG-UNIT TABS Take 500 mg by mouth 2 (two) times daily.    [provider]  clobetasol cream (TEMOVATE) 7.62 % Apply 1 application topically 2 (two) times daily.    [provider]  Cyanocobalamin (VITAMIN B-12) 2500 MCG SUBL Place 2,500 mcg under the tongue daily.    [provider]  ENSURE (ENSURE) Take 1 Can by mouth 2 (two) times daily between meals.     [provider]  ferrous sulfate 325 (65 FE) MG EC tablet Take 325 mg by mouth daily with breakfast.    [provider]  fluticasone (VERAMYST) 27.5 MCG/SPRAY nasal spray Place 2 sprays into the nose daily.    [provider]  folic acid (FOLVITE) 831 MCG tablet Take 1 tablet (800 mcg total) by mouth daily. 07/28/15   [name], MD  HYDROcodone-acetaminophen (NORCO/VICODIN) 5-325 MG tablet Take 1 tablet by mouth 3 (three) times daily as needed. 11/10/19   [name], MD  levETIRAcetam (KEPPRA) 250 MG tablet Take 1 tablet (250 mg total) by mouth 2 (two) times daily. 07/02/19   Ventura Sellers, MD  loratadine (CLARITIN) 10 MG tablet Take 1 tablet (10 mg total) by mouth daily. 04/30/16   [name], MD  mirtazapine (REMERON) 15 MG tablet Take 1 tablet (15 mg total) by mouth at bedtime. 03/10/19   Heilingoetter, Cassandra L, PA-C  Nutritional Supplements (JUICE PLUS FIBRE PO) Take 1 capsule by mouth daily. Reported on 07/28/2015    [provider]  ondansetron (ZOFRAN) 8 MG tablet Take 8 mg by mouth every 8 (eight) hours as needed. 06/30/19   [provider]  OVER THE COUNTER MEDICATION "Deep penetrating pain relief oil"    [provider]  prochlorperazine (COMPAZINE) 10 MG tablet TAKE 1 TABLET BY MOUTH EVERY 6 HOURS AS NEEDED FOR NAUSEA OR VOMITING. Patient taking differently: Take 10 mg by mouth every 6 (six) hours as needed for nausea. TAKE 1 TABLET BY MOUTH EVERY 6 HOURS AS NEEDED FOR  NAUSEA OR VOMITING. 03/10/19   Heilingoetter, Cassandra L, PA-C  ranitidine (ZANTAC) 300 MG tablet Take 1 tablet (300 mg total) by mouth at bedtime. 12/06/15   Mauri Pole, MD  rivaroxaban (XARELTO) 20 MG TABS tablet TAKE 1 TABLET (20 MG TOTAL) BY MOUTH DAILY WITH SUPPER. Patient not taking: Reported on 11/07/2019 06/02/19   [name], MD  TAGRISSO 80 MG tablet TAKE 1 TABLET (80 MG TOTAL) BY MOUTH DAILY. 04/28/19   [name], MD  TURMERIC PO Take 1 capsule by mouth 2 (two) times daily. Turmeric Superior 750 mg po bid    [provider]  XALKORI 250 MG capsule TAKE 1 CAPSULE BY MOUTH 2 TIMES DAILY. Patient not taking: Reported on 11/07/2019 06/23/19   [name], MD    Physical Exam: Vitals:   11/20/19 2301 11/21/19 0008 11/21/19 0030 11/21/19 0145  BP: (!) 151/61 120/73 120/73 113/70  Pulse: (!) 105  (!) 101 99  Resp: 17  16 18   Temp: 97.7 F (36.5 C)     TempSrc: Oral     SpO2: 100%  100% 100% 100%    Constitutional: NAD, calm, comfortable Vitals:   11/20/19 2301 11/21/19 0008 11/21/19 0030 11/21/19 0145  BP: (!) 151/61 120/73 120/73 113/70  Pulse: (!) 105  (!) 101 99  Resp: 17  16 18   Temp: 97.7 F (36.5 C)     TempSrc: Oral     SpO2: 100% 100% 100% 100%    General: 49 year old cachectic female who looks much older than the stated age in mild discomfort because of severe back pain. Eyes: PERRL, lids and conjunctivae normal ENMT: Mucous membranes are moist. Posterior pharynx clear of any exudate or lesions.Normal dentition.  Neck: normal, supple, no masses, no thyromegaly Respiratory: clear to auscultation bilaterally, no wheezing, no crackles. Normal respiratory effort. No accessory muscle use.  Cardiovascular: Regular rate and rhythm, no murmurs / rubs / gallops. No extremity edema. 2+ pedal pulses. No carotid bruits.  Abdomen: Tenderness with deep palpation in left lower quadrant, no masses palpated. No hepatosplenomegaly. Bowel sounds  positive.  Musculoskeletal: no clubbing / cyanosis. No joint deformity upper and lower extremities. Good ROM, no contractures. Normal muscle tone.  Skin: no rashes, lesions, ulcers. No induration Neurologic: CN 2-12 grossly intact. Sensation intact, DTR normal. Strength 5/5 in all 4.  Psychiatric: Normal judgment and insight. Alert and oriented x 3. Normal mood.   (Anything < 9 systems with 2 bullets each down codes to level 1) (If patient refuses exam can't bill higher level) (Make sure to document decubitus ulcers present on admission -- if possible -- and whether patient has chronic indwelling catheter at time of admission)  Labs on Admission: I have personally reviewed following labs and imaging studies  CBC: Recent Labs  Lab 11/20/19 2300  WBC 6.0  HGB 10.8*  HCT 30.3*  MCV 87.8  PLT 734   Basic Metabolic Panel: Recent Labs  Lab 11/20/19 2300  NA 113*  K 4.4  CL 80*  CO2 23  GLUCOSE 113*  BUN 14  CREATININE 0.38*  CALCIUM 8.1*   GFR: Estimated Creatinine Clearance: 52.3 mL/min (A) (by C-G formula based on SCr of 0.38 mg/dL (L)). Liver Function Tests: Recent Labs  Lab 11/20/19 2300  AST 29  ALT 34  ALKPHOS 74  BILITOT 0.6  PROT 7.3  ALBUMIN 3.6   Recent Labs  Lab 11/20/19 2300  LIPASE 34   No results for input(s): AMMONIA in the last 168 hours. Coagulation Profile: No results for input(s): INR, PROTIME in the last 168 hours. Cardiac Enzymes: No results for input(s): CKTOTAL, CKMB, CKMBINDEX, TROPONINI in the last 168 hours. BNP (last 3 results) No results for input(s): PROBNP in the last 8760 hours. HbA1C: No results for input(s): HGBA1C in the last 72 hours. CBG: No results for input(s): GLUCAP in the last 168 hours. Lipid Profile: No results for input(s): CHOL, HDL, LDLCALC, TRIG, CHOLHDL, LDLDIRECT in the last 72 hours. Thyroid Function Tests: No results for input(s): TSH, T4TOTAL, FREET4, T3FREE, THYROIDAB in the last 72 hours. Anemia  Panel: No results for input(s): VITAMINB12, FOLATE, FERRITIN, TIBC, IRON, RETICCTPCT in the last 72 hours. Urine analysis:    Component Value Date/Time   COLORURINE YELLOW 11/21/2019 0109   APPEARANCEUR HAZY (A) 11/21/2019 0109   LABSPEC 1.017 11/21/2019 0109   LABSPEC 1.005 09/26/2015 0920   PHURINE 7.0 11/21/2019 0109   GLUCOSEU NEGATIVE 11/21/2019 0109   GLUCOSEU Negative 09/26/2015 0920   HGBUR NEGATIVE 11/21/2019 0109   BILIRUBINUR NEGATIVE 11/21/2019 0109   BILIRUBINUR Negative 09/26/2015 0920   KETONESUR  20 (A) 11/21/2019 0109   PROTEINUR NEGATIVE 11/21/2019 0109   UROBILINOGEN 0.2 09/26/2015 0920   NITRITE NEGATIVE 11/21/2019 0109   LEUKOCYTESUR NEGATIVE 11/21/2019 0109   LEUKOCYTESUR Negative 09/26/2015 0920    Radiological Exams on Admission: CT ABDOMEN PELVIS W CONTRAST  Result Date: 11/21/2019 CLINICAL DATA:  Known lung cancer, on oral chemotherapy, emesis since since yesterday EXAM: CT ABDOMEN AND PELVIS WITH CONTRAST TECHNIQUE: Multidetector CT imaging of the abdomen and pelvis was performed using the standard protocol following bolus administration of intravenous contrast. CONTRAST:  65mL OMNIPAQUE IOHEXOL 300 MG/ML  SOLN COMPARISON:  CT chest, abdomen and pelvis 11/07/2019 FINDINGS: Lower chest: Fairly consolidated region of lung in the medial right lung base with some features of post treatment related effect and fibrosis albeit with several fluid-filled airways in the non aerated portion of the lung. Small volume right pleural fluid is noted as well. Left base is clear. Stable sclerotic focus in lateral right sixth rib. Normal heart size. No pericardial effusion. Trace pericardial fluid is unchanged from prior. Hepatobiliary: Redemonstrated bandlike region of parenchymal hypoattenuation extending throughout the right lobe liver. Several scattered subcentimeter hypoattenuating foci are present throughout the liver, also unchanged from priors. Some mild intra and  extrahepatic biliary ductal dilatation is similar to comparison studies without visible intraductal gallstones or other filling defect evident. Gallbladder Lea partially distended without significant wall thickening but with some fluid localized to the porta hepatis gallbladder fossa. Pancreas: Redemonstrated patchy enhancement of the pancreas with a slightly lobular, thickened appearance albeit without focal lesion or pancreatic ductal dilatation. Some surrounding stranding seen in the adjacent retroperitoneum. Spleen: Diminutive spleen. No focal lesion. Small volume perisplenic free fluid seen posteriorly. Adrenals/Urinary Tract: Normal adrenal glands. Increasingly conspicuous hypoattenuation of the upper poles and medial kidneys bilaterally. Additionally, there is some mild bilateral urothelial thickening, left slightly greater than right. No focal liver lesion is seen. No urolithiasis. Urinary bladder is largely decompressed at the time of exam and therefore poorly evaluated by CT imaging. Some mild bladder wall thickening is nonspecific. Stomach/Bowel: Distal esophagus is unremarkable. Some thickening of the gastric antrum likely related to normal peristalsis. Some slight mucosal hyperemia throughout the first portion of the duodenum is nonspecific and similar to the comparison exam. Some adjacent inflammatory changes may be redistributed. No other small bowel thickening or conspicuous enhancement. No dilatation or evidence of obstruction. A normal appendix is visualized. No colonic dilatation or wall thickening. Vascular/Lymphatic: Atherosclerotic calcifications within the abdominal aorta and branch vessels. No aneurysm or ectasia. No enlarged abdominopelvic lymph nodes. Some reactive appearing and possibly edematous nodes in the upper abdomen. Reproductive: Slight prominence of the left parametrial and gonadal vessels. Otherwise normal appearance of the anteverted uterus without other concerning adnexal  abnormality. Other: Increasing upper abdominal edematous changes predominantly centered in the central mesentery and retroperitoneum. No free air. No bowel containing hernia. Low-attenuation ascites similar to only minimally increased in volume from prior with some redistribution including to the subphrenic spaces. Musculoskeletal: Redemonstration of the diffuse osseous metastatic disease including sclerotic foci in the spine and more expansile lytic and sclerotic appearance of the left ilium and acetabulum. IMPRESSION: 1. Hypoattenuation of the upper poles and medial kidneys bilaterally with some mild bilateral urothelial thickening, left slightly greater than right. Could be related to radiation related effects though could correlate with urinalysis to exclude urinary tract infection. 2. Redemonstrated patchy enhancement of the pancreas with a slightly lobular, thickened appearance albeit without focal lesion or pancreatic ductal dilatation. Some surrounding  stranding seen in the adjacent retroperitoneum. While this could reflect radiation related changes a pancreatitis could present similarly. Correlate with lipase. 3. Slight mucosal hyperemia throughout the first portion of the duodenum is nonspecific and similar to the comparison exam. Possibly within a radiation field as well with additional inflammatory changes in the upper abdomen and retroperitoneum. 4. Low-attenuation ascites similar to only minimally increased in volume from prior with some redistribution including to the subphrenic spaces. 5. Fairly consolidated region of lung in the medial right lung base with some features of post treatment related effect and fibrosis albeit with several fluid-filled airways in the non aerated portion of the lung. Findings are favored to reflect post treatment related effect though superimposed aspiration may be present. 6. Slight prominence of the left parametrial and gonadal vessels, which can be seen with pelvic  congestion syndrome in the appropriate clinical setting. 7. Aortic Atherosclerosis (ICD10-I70.0). Electronically Signed   By: Lovena Tuller M.D.   On: 11/21/2019 02:00      Assessment/Plan Principal Problem:   Hyponatremia Sodium level was 113 on arrival to the hospital.  Continue IV normal saline at the rate of 100 mL/h.  Continue to monitor BMP every 4 hour.  Goal is to correct sodium about 8 to 10 mEq in 24 hours.  Hyponatremia with hypochloremia is most likely secondary to dehydration and poor oral intake in the setting of severe nausea and vomiting.  Continue to monitor  Active Problems:   Non-small cell carcinoma of lung, stage 4 (HCC) Patient is s/p chemotherapy.  Cancer is already metastasized to brain, bones and adrenals.  Pain medications according to the pain scales ordered    Nausea and vomiting Nausea and vomiting is improved at this time.  IV Zofran every 6 hours as needed for nausea and vomiting    Back pain Tylenol as needed for mild pain, Norco as needed for moderate pain while Dilaudid as needed for severe pain ordered.  (please populate well all problems here in Problem List. (For example, if patient is on BP meds at home and you resume or decide to hold them, it is a problem that needs to be her. Same for CAD, COPD, HLD and so on)    DVT prophylaxis: Lovenox Code Status: Full code Family Communication: Full code Disposition Plan:  Consults called:  Admission status: Observation/telemetry   Edmonia Lynch MD [facility] Pager [phone]   If 7PM-7AM, please contact night-coverage [email or web address] Password [email or web address]  11/21/2019, 3:12 AM

## 2019-11-21 NOTE — ED Notes (Signed)
Provider made aware of pt's increasing HR (130-135)

## 2019-11-21 NOTE — ED Notes (Signed)
Pt returned from CT °

## 2019-11-21 NOTE — ED Notes (Signed)
Patient transported to CT 

## 2019-11-21 NOTE — ED Notes (Signed)
Spoke with Dr. Alfredia Ferguson, notified of Critical Sodium and HR in the 120-130's. States he will put in a 594mL bolus.

## 2019-11-22 ENCOUNTER — Encounter (HOSPITAL_COMMUNITY): Payer: Self-pay | Admitting: Internal Medicine

## 2019-11-22 DIAGNOSIS — K5900 Constipation, unspecified: Secondary | ICD-10-CM

## 2019-11-22 DIAGNOSIS — R109 Unspecified abdominal pain: Secondary | ICD-10-CM

## 2019-11-22 LAB — BASIC METABOLIC PANEL
Anion gap: 8 (ref 5–15)
BUN: 7 mg/dL (ref 6–20)
CO2: 18 mmol/L — ABNORMAL LOW (ref 22–32)
Calcium: 7.1 mg/dL — ABNORMAL LOW (ref 8.9–10.3)
Chloride: 100 mmol/L (ref 98–111)
Creatinine, Ser: 0.5 mg/dL (ref 0.44–1.00)
GFR, Estimated: >60 mL/min (ref >60)
Glucose, Bld: 132 mg/dL — ABNORMAL HIGH (ref 70–99)
Potassium: 3.9 mmol/L (ref 3.5–5.1)
Sodium: 126 mmol/L — ABNORMAL LOW (ref 135–145)

## 2019-11-22 LAB — COMPREHENSIVE METABOLIC PANEL
ALT: 24 U/L (ref 0–44)
AST: 19 U/L (ref 15–41)
Albumin: 2.8 g/dL — ABNORMAL LOW (ref 3.5–5.0)
Alkaline Phosphatase: 66 U/L (ref 38–126)
Anion gap: 9 (ref 5–15)
BUN: 6 mg/dL (ref 6–20)
CO2: 20 mmol/L — ABNORMAL LOW (ref 22–32)
Calcium: 7.7 mg/dL — ABNORMAL LOW (ref 8.9–10.3)
Chloride: 99 mmol/L (ref 98–111)
Creatinine, Ser: 0.46 mg/dL (ref 0.44–1.00)
GFR, Estimated: >60 mL/min (ref >60)
Glucose, Bld: 63 mg/dL — ABNORMAL LOW (ref 70–99)
Potassium: 3.8 mmol/L (ref 3.5–5.1)
Sodium: 128 mmol/L — ABNORMAL LOW (ref 135–145)
Total Bilirubin: 0.9 mg/dL (ref 0.3–1.2)
Total Protein: 6.3 g/dL — ABNORMAL LOW (ref 6.5–8.1)

## 2019-11-22 LAB — CBC WITH DIFFERENTIAL/PLATELET
Abs Immature Granulocytes: 0.06 10*3/uL (ref 0.00–0.07)
Basophils Absolute: 0 10*3/uL (ref 0.0–0.1)
Basophils Relative: 0 %
Eosinophils Absolute: 0 10*3/uL (ref 0.0–0.5)
Eosinophils Relative: 0 %
HCT: 30 % — ABNORMAL LOW (ref 36.0–46.0)
Hemoglobin: 10 g/dL — ABNORMAL LOW (ref 12.0–15.0)
Immature Granulocytes: 1 %
Lymphocytes Relative: 3 %
Lymphs Abs: 0.3 10*3/uL — ABNORMAL LOW (ref 0.7–4.0)
MCH: 30.3 pg (ref 26.0–34.0)
MCHC: 33.3 g/dL (ref 30.0–36.0)
MCV: 90.9 fL (ref 80.0–100.0)
Monocytes Absolute: 0.5 10*3/uL (ref 0.1–1.0)
Monocytes Relative: 4 %
Neutro Abs: 11.4 10*3/uL — ABNORMAL HIGH (ref 1.7–7.7)
Neutrophils Relative %: 92 %
Platelets: 324 10*3/uL (ref 150–400)
RBC: 3.3 MIL/uL — ABNORMAL LOW (ref 3.87–5.11)
RDW: 13.4 % (ref 11.5–15.5)
WBC: 12.3 10*3/uL — ABNORMAL HIGH (ref 4.0–10.5)
nRBC: 0 % (ref 0.0–0.2)

## 2019-11-22 LAB — PHOSPHORUS: Phosphorus: 1.9 mg/dL — ABNORMAL LOW (ref 2.5–4.6)

## 2019-11-22 LAB — MAGNESIUM: Magnesium: 2.2 mg/dL (ref 1.7–2.4)

## 2019-11-22 LAB — LIPASE, BLOOD: Lipase: 22 U/L (ref 11–51)

## 2019-11-22 MED ORDER — ENOXAPARIN SODIUM 30 MG/0.3ML ~~LOC~~ SOLN
30.0000 mg | SUBCUTANEOUS | Status: DC
  Administered 2019-11-23 – 2019-11-27 (×5): 30 mg via SUBCUTANEOUS
  Filled 2019-11-22 (×6): qty 0.3

## 2019-11-22 MED ORDER — BISACODYL 10 MG RE SUPP
10.0000 mg | Freq: Every day | RECTAL | Status: DC | PRN
  Administered 2019-11-25: 10 mg via RECTAL
  Filled 2019-11-22: qty 1

## 2019-11-22 MED ORDER — POTASSIUM PHOSPHATES 15 MMOLE/5ML IV SOLN
20.0000 mmol | Freq: Once | INTRAVENOUS | Status: AC
  Administered 2019-11-22: 20 mmol via INTRAVENOUS
  Filled 2019-11-22: qty 6.67

## 2019-11-22 MED ORDER — OSIMERTINIB MESYLATE 80 MG PO TABS
80.0000 mg | ORAL_TABLET | Freq: Every day | ORAL | Status: DC
  Administered 2019-11-23 – 2019-11-27 (×5): 80 mg via ORAL

## 2019-11-22 MED ORDER — POLYETHYLENE GLYCOL 3350 17 G PO PACK
17.0000 g | PACK | Freq: Two times a day (BID) | ORAL | Status: DC
  Administered 2019-11-22 – 2019-11-27 (×4): 17 g via ORAL
  Filled 2019-11-22 (×10): qty 1

## 2019-11-22 MED ORDER — SENNOSIDES-DOCUSATE SODIUM 8.6-50 MG PO TABS
1.0000 | ORAL_TABLET | Freq: Two times a day (BID) | ORAL | Status: DC
  Administered 2019-11-22 – 2019-11-27 (×6): 1 via ORAL
  Filled 2019-11-22 (×10): qty 1

## 2019-11-22 NOTE — Progress Notes (Signed)
PROGRESS NOTE    Deborah Foster  EZM:[id number] DOB: [birth year] DOA: 11/20/2019 PCP: [name], MD   Brief Narrative:  The patient is a 49 year old [place] speaking female with a past medical history significant for but not limited to metastatic lung cancer metastasis to the brain, metastasis to the adrenal gland, bone metastasis and other comorbidities including but not limited to hemoptysis, history of upper respiratory infection history of radiation to the brain and chest and left hip as well as other comorbidities who sees Dr. Julien Nordmann as an outpatient who presents to the ED with intractable nausea, vomiting and back pain.  She started having vomiting with episodic left lower quadrant pain and was unable to drink and eat well because of the worsening nausea vomiting and feels very weak and tired.  She reported that her abdominal pain was resolved at the time of her admission but she was having 8 out of 10 pain in her back.  She complains of pain in the right rib.  Denies any fever, chills, shortness of breath or any other symptoms associated.  On arrival to the ED she is found to have a temperature of 97.7 and blood work showed that she had a serum glucose of 113.  Is started on normal saline, given IV Zofran and IV morphine is being admitted.  **Interim History   Assessment & Plan:   Principal Problem:   Hyponatremia Active Problems:   Non-small cell carcinoma of lung, stage 4 (HCC)   Nausea and vomiting   Back pain  Hyponatremia/Hypochloremia  -Sodium level was 113 on arrival to the hospital. Now slowly improving and is 118 yesterday and was 128 this AM  -Continue IV normal saline but will reduce the rate of 100 mL/h to 50 mL/hr.  -Continue to monitor BMP every 4 hour.  -Goal is to correct sodium about 8 to 10 mEq in 24 hours.  -Hyponatremia with hypochloremia is most likely secondary to dehydration and poor oral intake in the setting of severe nausea and vomiting.   -Urine Osm not done but Serum Som was 244 -Urine Sodium was 110 and Urine Cr was 53.88 -Repeat BMP this Afternoon is pending  -Continue to monitor carefully  Non-Small Cell Carcinoma of Lung, stage 4 (HCC) -Patient is s/p chemotherapy.  -Cancer is already metastasized to brain, bones and adrenals.  -Pain medications according to the pain scales ordered -CT Scan showed "Fairly consolidated region of lung in the medial right lung base with some features of post treatment related effect and fibrosis albeit with several fluid-filled airways in the non aerated portion of the lung. Findings are favored to reflect post treatment related effect though superimposed aspiration may be present. Slight prominence of the left parametrial and gonadal vessels, which can be seen with pelvic congestion syndrome in the appropriate clinical setting. Aortic Atherosclerosis."  Sinus Tachycardia -Patient's HR is elevated in the setting of Pain and Dehydration -C/w IVF and reduce rate and gave a bolus of NS at 500 mL x1; Already received a 1 Liter NS bolus -Continue to Monitor on Telemetry   Nausea and Vomiting, improved  Abdominal Pain -Nausea and vomiting is improved at this time.  -IV Zofran every 6 hours as needed for nausea and vomiting -Diet Started to FULL Liquid Diet and will go to SOFT -CT Abd/Pelvis showed "Hypoattenuation of the upper poles and medial kidneys bilaterally with some mild bilateral urothelial thickening, left slightly greater than right. Could be related to radiation related effects though could  correlate with urinalysis to exclude urinary tract infection. Redemonstrated patchy enhancement of the pancreas with a slightly lobular, thickened appearance albeit without focal lesion or pancreatic ductal dilatation. Some surrounding stranding seen in the adjacent retroperitoneum. While this could reflect radiation related changes a pancreatitis could present similarly. Correlate with lipase.  Slight mucosal hyperemia throughout the first portion of the duodenum is nonspecific and similar to the comparison exam. Possibly within a radiation field as well with additional inflammatory changes in the upper abdomen and retroperitoneum.  Low-attenuation ascites similar to only minimally increased in volume from prior with some redistribution including to the subphrenic spaces." -Will check Lipase Level given her Abdominal Pain; Lipase Level went from 34 -> 22 -Has some Abdominal Pain but it improves after having a bowel movement and urination  -IVF rate reduced to 50 mL in order to prevent over correction  Back Pain -Tylenol as needed for mild pain, Norco as needed for moderate pain while Dilaudid as needed for severe pain ordered.  Constipation -Bowel Regimen started -C/w Senna-Docusate 1 tab po BID, Miralax 17 grams BID, and Bisacodyl 10 mg Suppository PRN -Has some abdominal pain which is improved with defecation   Hypocalcemia -Patient's Ca2+ yesterday was 6.2; Now Ca2+ is now 7.7 -Replete with IV Calcium Gluconate 2 grams yesterday -Check Ionized Calcium -Check Albumin Level in the AM for Calcium Correction; Corrected Ca2+ is now 8.7 -Continue to Monitor and repeat Ca2+ in the AM   Metabolic Acidosis -Mild. CO2 was 21, Chloride Level was 90, AG was 7 yesterday; Now CO2 is 20, AG is 9, Chloride Lelve is 99 -C/w IVF as above and reduced rate to 50 mL/hr -Continue to Monitor and Repeat CMP in the AM   Normocytic Anemia -Patient's Hgb/Hct has gone from 10.8/31.2 -> 10.0/30.0 -Check Anemia Panel in the AM -Continue to Monitor for S/Sx of Bleeding; Currently no overt bleeding noted -Repeat CBC in the AM   Hypophosphatemia -Patient's Phos Level was 1.9 -Replete with IV K Phos 20 mmol -Continue to Monitor and Replete as Necessary -Repeat Phos Level in the AM   Leukocytosis -Patient's WBC went from 7.2 -> 12.3 and likely reative -Currently Afebrile. Continue to Monitor for  S/Sx of Infection -Repeat CBC in the AM   Seizure Disorder -C/w Levetiracetam 250 mg po BID  Underweight -Estimated body mass index is 17.17 kg/m as calculated from the following:   Height as of this encounter: 4\' 11"  (1.499 m).   Weight as of this encounter: 38.6 kg. -Will consult Nutrition for further evaluation     DVT prophylaxis: Enoxaparin 40 mg sq q24h Code Status: FULL CODE  Family Communication: No family present at bedside  Disposition Plan: Pending further clinical improvement and tolerance of diet po  Status is: Inpatient  Remains inpatient appropriate because:Unsafe d/c plan, IV treatments appropriate due to intensity of illness or inability to take PO and Inpatient level of care appropriate due to severity of illness   Dispo: The patient is from: Home              Anticipated d/c is to: Home              Anticipated d/c date is: 1 day              Patient currently is not medically stable to d/c.  Consultants:   None  Procedures: None  Antimicrobials:  Anti-infectives (From admission, onward)   None        Subjective: Seen And  examined at bedside she is doing a bit better but his color of some back pain is some abdominal pain.  Having trouble defecating and wanted a stool softener.  No nausea or vomiting.  Tolerating diet without issues.  No other concerns complaints at this time.  Objective: Vitals:   11/22/19 0150 11/22/19 0537 11/22/19 0827 11/22/19 1220  BP: (!) 89/62 101/60 103/62 (!) 94/58  Pulse: (!) 103 (!) 113 (!) 110 (!) 108  Resp: 16 18 16 16   Temp: (!) 97.4 F (36.3 C) 97.7 F (36.5 C) 98.9 F (37.2 C) 98 F (36.7 C)  TempSrc: Oral Oral  Oral  SpO2: 99% 100% 100% 99%  Weight:      Height:        Intake/Output Summary (Last 24 hours) at 11/22/2019 1345 Last data filed at 11/22/2019 0200 Gross per 24 hour  Intake 1555.53 ml  Output --  Net 1555.53 ml   Filed Weights   11/21/19 1120  Weight: 38.6 kg   Examination: Physical  Exam:  Constitutional: Thin chronically ill appearing female in NAD and appears calm but slightly uncomfortable Eyes: Lids and conjunctivae normal, sclerae anicteric  ENMT: External Ears, Nose appear normal. Grossly normal hearing.  Neck: Appears normal, supple, no cervical masses, normal ROM, no appreciable thyromegaly; no JVD Respiratory: Diminished to auscultation bilaterally, no wheezing, rales, rhonchi or crackles. Normal respiratory effort and patient is not tachypenic. No accessory muscle use. Unlabored breathing  Cardiovascular: RRR, no murmurs / rubs / gallops. S1 and S2 auscultated.  Abdomen: Soft, mildly -tender, non-distended. Bowel sounds positive.  GU: Deferred. Musculoskeletal: No clubbing / cyanosis of digits/nails. No joint deformity upper and lower extremities.  Skin: No rashes, lesions, ulcers on a limited skin evaluation. No induration; Warm and dry.  Neurologic: CN 2-12 grossly intact with no focal deficits. Romberg sign and  cerebellar reflexes not assessed.  Psychiatric: Normal judgment and insight. Alert and oriented x 3. Normal mood and appropriate affect.   Data Reviewed: I have personally reviewed following labs and imaging studies  CBC: Recent Labs  Lab 11/20/19 2300 11/21/19 0303 11/22/19 0626  WBC 6.0 7.2 12.3*  NEUTROABS  --   --  11.4*  HGB 10.8* 10.8* 10.0*  HCT 30.3* 31.2* 30.0*  MCV 87.8 87.2 90.9  PLT 332 339 115   Basic Metabolic Panel: Recent Labs  Lab 11/20/19 2300 11/21/19 0303 11/21/19 0746 11/21/19 1229 11/22/19 0626  NA 113* 115* 115* 118* 128*  K 4.4 4.4 4.8 3.9 3.8  CL 80* 83* 85* 90* 99  CO2 23 21* 22 21* 20*  GLUCOSE 113* 99 86 98 63*  BUN 14 9 7 9 6   CREATININE 0.38* 0.36* 0.50 0.38* 0.46  CALCIUM 8.1* 7.5* 7.2* 6.2* 7.7*  MG  --   --   --   --  2.2  PHOS  --   --   --   --  1.9*   GFR: Estimated Creatinine Clearance: 53 mL/min (by C-G formula based on SCr of 0.46 mg/dL). Liver Function Tests: Recent Labs  Lab  11/20/19 2300 11/22/19 0626  AST 29 19  ALT 34 24  ALKPHOS 74 66  BILITOT 0.6 0.9  PROT 7.3 6.3*  ALBUMIN 3.6 2.8*   Recent Labs  Lab 11/20/19 2300 11/22/19 0623  LIPASE 34 22   No results for input(s): AMMONIA in the last 168 hours. Coagulation Profile: No results for input(s): INR, PROTIME in the last 168 hours. Cardiac Enzymes: No results for input(s):  CKTOTAL, CKMB, CKMBINDEX, TROPONINI in the last 168 hours. BNP (last 3 results) No results for input(s): PROBNP in the last 8760 hours. HbA1C: No results for input(s): HGBA1C in the last 72 hours. CBG: No results for input(s): GLUCAP in the last 168 hours. Lipid Profile: No results for input(s): CHOL, HDL, LDLCALC, TRIG, CHOLHDL, LDLDIRECT in the last 72 hours. Thyroid Function Tests: No results for input(s): TSH, T4TOTAL, FREET4, T3FREE, THYROIDAB in the last 72 hours. Anemia Panel: No results for input(s): VITAMINB12, FOLATE, FERRITIN, TIBC, IRON, RETICCTPCT in the last 72 hours. Sepsis Labs: No results for input(s): PROCALCITON, LATICACIDVEN in the last 168 hours.  Recent Results (from the past 240 hour(s))  Respiratory Panel by RT PCR (Flu A&B, Covid) - Nasopharyngeal Swab     Status: None   Collection Time: 11/21/19  2:38 AM   Specimen: Nasopharyngeal Swab  Result Value Ref Range Status   SARS Coronavirus 2 by RT PCR NEGATIVE NEGATIVE Final    Comment: (NOTE) SARS-CoV-2 target nucleic acids are NOT DETECTED.  The SARS-CoV-2 RNA is generally detectable in upper respiratoy specimens during the acute phase of infection. The lowest concentration of SARS-CoV-2 viral copies this assay can detect is 131 copies/mL. A negative result does not preclude SARS-Cov-2 infection and should not be used as the sole basis for treatment or other patient management decisions. A negative result may occur with  improper specimen collection/handling, submission of specimen other than nasopharyngeal swab, presence of viral  mutation(s) within the areas targeted by this assay, and inadequate number of viral copies (<131 copies/mL). A negative result must be combined with clinical observations, patient history, and epidemiological information. The expected result is Negative.  Fact Sheet for Patients:  PinkCheek.be  Fact Sheet for Healthcare Providers:  GravelBags.it  This test is no t yet approved or cleared by the [place] FDA and  has been authorized for detection and/or diagnosis of SARS-CoV-2 by FDA under an Emergency Use Authorization (EUA). This EUA will remain  in effect (meaning this test can be used) for the duration of the COVID-19 declaration under Section 564(b)(1) of the Act, 21 U.S.C. section 360bbb-3(b)(1), unless the authorization is terminated or revoked sooner.     Influenza A by PCR NEGATIVE NEGATIVE Final   Influenza B by PCR NEGATIVE NEGATIVE Final    Comment: (NOTE) The Xpert Xpress SARS-CoV-2/FLU/RSV assay is intended as an aid in  the diagnosis of influenza from Nasopharyngeal swab specimens and  should not be used as a sole basis for treatment. Nasal washings and  aspirates are unacceptable for Xpert Xpress SARS-CoV-2/FLU/RSV  testing.  Fact Sheet for Patients: PinkCheek.be  Fact Sheet for Healthcare Providers: GravelBags.it  This test is not yet approved or cleared by the [place] FDA and  has been authorized for detection and/or diagnosis of SARS-CoV-2 by  FDA under an Emergency Use Authorization (EUA). This EUA will remain  in effect (meaning this test can be used) for the duration of the  Covid-19 declaration under Section 564(b)(1) of the Act, 21  U.S.C. section 360bbb-3(b)(1), unless the authorization is  terminated or revoked. Performed at [facility], [place] [address] [place], [place] [address]     RN Pressure Injury  Documentation:     Estimated body mass index is 17.17 kg/m as calculated from the following:   Height as of this encounter: 4\' 11"  (1.499 m).   Weight as of this encounter: 38.6 kg.  Malnutrition Type:   Malnutrition Characteristics:   Nutrition  Interventions:   Radiology Studies: CT ABDOMEN PELVIS W CONTRAST  Result Date: 11/21/2019 CLINICAL DATA:  Known lung cancer, on oral chemotherapy, emesis since since yesterday EXAM: CT ABDOMEN AND PELVIS WITH CONTRAST TECHNIQUE: Multidetector CT imaging of the abdomen and pelvis was performed using the standard protocol following bolus administration of intravenous contrast. CONTRAST:  42mL OMNIPAQUE IOHEXOL 300 MG/ML  SOLN COMPARISON:  CT chest, abdomen and pelvis 11/07/2019 FINDINGS: Lower chest: Fairly consolidated region of lung in the medial right lung base with some features of post treatment related effect and fibrosis albeit with several fluid-filled airways in the non aerated portion of the lung. Small volume right pleural fluid is noted as well. Left base is clear. Stable sclerotic focus in lateral right sixth rib. Normal heart size. No pericardial effusion. Trace pericardial fluid is unchanged from prior. Hepatobiliary: Redemonstrated bandlike region of parenchymal hypoattenuation extending throughout the right lobe liver. Several scattered subcentimeter hypoattenuating foci are present throughout the liver, also unchanged from priors. Some mild intra and extrahepatic biliary ductal dilatation is similar to comparison studies without visible intraductal gallstones or other filling defect evident. Gallbladder Lea partially distended without significant wall thickening but with some fluid localized to the porta hepatis gallbladder fossa. Pancreas: Redemonstrated patchy enhancement of the pancreas with a slightly lobular, thickened appearance albeit without focal lesion or pancreatic ductal dilatation. Some surrounding stranding seen in the adjacent  retroperitoneum. Spleen: Diminutive spleen. No focal lesion. Small volume perisplenic free fluid seen posteriorly. Adrenals/Urinary Tract: Normal adrenal glands. Increasingly conspicuous hypoattenuation of the upper poles and medial kidneys bilaterally. Additionally, there is some mild bilateral urothelial thickening, left slightly greater than right. No focal liver lesion is seen. No urolithiasis. Urinary bladder is largely decompressed at the time of exam and therefore poorly evaluated by CT imaging. Some mild bladder wall thickening is nonspecific. Stomach/Bowel: Distal esophagus is unremarkable. Some thickening of the gastric antrum likely related to normal peristalsis. Some slight mucosal hyperemia throughout the first portion of the duodenum is nonspecific and similar to the comparison exam. Some adjacent inflammatory changes may be redistributed. No other small bowel thickening or conspicuous enhancement. No dilatation or evidence of obstruction. A normal appendix is visualized. No colonic dilatation or wall thickening. Vascular/Lymphatic: Atherosclerotic calcifications within the abdominal aorta and branch vessels. No aneurysm or ectasia. No enlarged abdominopelvic lymph nodes. Some reactive appearing and possibly edematous nodes in the upper abdomen. Reproductive: Slight prominence of the left parametrial and gonadal vessels. Otherwise normal appearance of the anteverted uterus without other concerning adnexal abnormality. Other: Increasing upper abdominal edematous changes predominantly centered in the central mesentery and retroperitoneum. No free air. No bowel containing hernia. Low-attenuation ascites similar to only minimally increased in volume from prior with some redistribution including to the subphrenic spaces. Musculoskeletal: Redemonstration of the diffuse osseous metastatic disease including sclerotic foci in the spine and more expansile lytic and sclerotic appearance of the left ilium and  acetabulum. IMPRESSION: 1. Hypoattenuation of the upper poles and medial kidneys bilaterally with some mild bilateral urothelial thickening, left slightly greater than right. Could be related to radiation related effects though could correlate with urinalysis to exclude urinary tract infection. 2. Redemonstrated patchy enhancement of the pancreas with a slightly lobular, thickened appearance albeit without focal lesion or pancreatic ductal dilatation. Some surrounding stranding seen in the adjacent retroperitoneum. While this could reflect radiation related changes a pancreatitis could present similarly. Correlate with lipase. 3. Slight mucosal hyperemia throughout the first portion of the duodenum is nonspecific and similar  to the comparison exam. Possibly within a radiation field as well with additional inflammatory changes in the upper abdomen and retroperitoneum. 4. Low-attenuation ascites similar to only minimally increased in volume from prior with some redistribution including to the subphrenic spaces. 5. Fairly consolidated region of lung in the medial right lung base with some features of post treatment related effect and fibrosis albeit with several fluid-filled airways in the non aerated portion of the lung. Findings are favored to reflect post treatment related effect though superimposed aspiration may be present. 6. Slight prominence of the left parametrial and gonadal vessels, which can be seen with pelvic congestion syndrome in the appropriate clinical setting. 7. Aortic Atherosclerosis (ICD10-I70.0). Electronically Signed   By: Lovena Ching M.D.   On: 11/21/2019 02:00   Scheduled Meds: . enoxaparin (LOVENOX) injection  40 mg Subcutaneous Q24H  . levETIRAcetam  250 mg Oral BID  . polyethylene glycol  17 g Oral BID  . senna-docusate  1 tablet Oral BID   Continuous Infusions: . sodium chloride 50 mL/hr at 11/22/19 1143  . potassium PHOSPHATE IVPB (in mmol) 20 mmol (11/22/19 1056)    LOS: 1  day   Kerney Elbe, DO [facility] PAGER is on [place]  If 7PM-7AM, please contact night-coverage [email or web address]

## 2019-11-22 NOTE — Plan of Care (Signed)

## 2019-11-23 LAB — CBC WITH DIFFERENTIAL/PLATELET
Abs Immature Granulocytes: 0.01 10*3/uL (ref 0.00–0.07)
Basophils Absolute: 0 10*3/uL (ref 0.0–0.1)
Basophils Relative: 0 %
Eosinophils Absolute: 0 10*3/uL (ref 0.0–0.5)
Eosinophils Relative: 0 %
HCT: 25.6 % — ABNORMAL LOW (ref 36.0–46.0)
Hemoglobin: 8.7 g/dL — ABNORMAL LOW (ref 12.0–15.0)
Immature Granulocytes: 0 %
Lymphocytes Relative: 5 %
Lymphs Abs: 0.4 10*3/uL — ABNORMAL LOW (ref 0.7–4.0)
MCH: 30.5 pg (ref 26.0–34.0)
MCHC: 34 g/dL (ref 30.0–36.0)
MCV: 89.8 fL (ref 80.0–100.0)
Monocytes Absolute: 0.5 10*3/uL (ref 0.1–1.0)
Monocytes Relative: 5 %
Neutro Abs: 7.5 10*3/uL (ref 1.7–7.7)
Neutrophils Relative %: 90 %
Platelets: 277 10*3/uL (ref 150–400)
RBC: 2.85 MIL/uL — ABNORMAL LOW (ref 3.87–5.11)
RDW: 13.8 % (ref 11.5–15.5)
WBC: 8.4 10*3/uL (ref 4.0–10.5)
nRBC: 0 % (ref 0.0–0.2)

## 2019-11-23 LAB — COMPREHENSIVE METABOLIC PANEL
ALT: 19 U/L (ref 0–44)
AST: 14 U/L — ABNORMAL LOW (ref 15–41)
Albumin: 2.4 g/dL — ABNORMAL LOW (ref 3.5–5.0)
Alkaline Phosphatase: 61 U/L (ref 38–126)
Anion gap: 7 (ref 5–15)
BUN: 5 mg/dL — ABNORMAL LOW (ref 6–20)
CO2: 21 mmol/L — ABNORMAL LOW (ref 22–32)
Calcium: 7 mg/dL — ABNORMAL LOW (ref 8.9–10.3)
Chloride: 100 mmol/L (ref 98–111)
Creatinine, Ser: 0.35 mg/dL — ABNORMAL LOW (ref 0.44–1.00)
GFR, Estimated: >60 mL/min (ref >60)
Glucose, Bld: 97 mg/dL (ref 70–99)
Potassium: 3.3 mmol/L — ABNORMAL LOW (ref 3.5–5.1)
Sodium: 128 mmol/L — ABNORMAL LOW (ref 135–145)
Total Bilirubin: 0.3 mg/dL (ref 0.3–1.2)
Total Protein: 5.4 g/dL — ABNORMAL LOW (ref 6.5–8.1)

## 2019-11-23 LAB — MAGNESIUM: Magnesium: 2.1 mg/dL (ref 1.7–2.4)

## 2019-11-23 LAB — PHOSPHORUS: Phosphorus: 1.9 mg/dL — ABNORMAL LOW (ref 2.5–4.6)

## 2019-11-23 MED ORDER — ADULT MULTIVITAMIN W/MINERALS CH
1.0000 | ORAL_TABLET | Freq: Every day | ORAL | Status: DC
  Administered 2019-11-23 – 2019-11-24 (×2): 1 via ORAL
  Filled 2019-11-23 (×3): qty 1

## 2019-11-23 MED ORDER — POTASSIUM PHOSPHATES 15 MMOLE/5ML IV SOLN
20.0000 mmol | Freq: Once | INTRAVENOUS | Status: AC
  Administered 2019-11-23: 20 mmol via INTRAVENOUS
  Filled 2019-11-23: qty 6.67

## 2019-11-23 MED ORDER — CALCIUM GLUCONATE-NACL 1-0.675 GM/50ML-% IV SOLN
1.0000 g | Freq: Once | INTRAVENOUS | Status: AC
  Administered 2019-11-23: 1000 mg via INTRAVENOUS
  Filled 2019-11-23: qty 50

## 2019-11-23 MED ORDER — GABAPENTIN 300 MG PO CAPS
300.0000 mg | ORAL_CAPSULE | Freq: Two times a day (BID) | ORAL | Status: DC
  Administered 2019-11-23 – 2019-11-27 (×5): 300 mg via ORAL
  Filled 2019-11-23 (×8): qty 1

## 2019-11-23 MED ORDER — BOOST / RESOURCE BREEZE PO LIQD CUSTOM
1.0000 | Freq: Two times a day (BID) | ORAL | Status: DC
  Administered 2019-11-23 – 2019-11-27 (×6): 1 via ORAL

## 2019-11-23 MED ORDER — ONDANSETRON HCL 4 MG PO TABS
4.0000 mg | ORAL_TABLET | Freq: Four times a day (QID) | ORAL | Status: DC
  Administered 2019-11-23 – 2019-11-25 (×8): 4 mg via ORAL
  Filled 2019-11-23 (×9): qty 1

## 2019-11-23 MED ORDER — ONDANSETRON HCL 4 MG/2ML IJ SOLN
4.0000 mg | Freq: Four times a day (QID) | INTRAMUSCULAR | Status: DC
  Administered 2019-11-23 – 2019-11-26 (×3): 4 mg via INTRAVENOUS
  Filled 2019-11-23 (×3): qty 2

## 2019-11-23 MED ORDER — LEVETIRACETAM 250 MG PO TABS
250.0000 mg | ORAL_TABLET | Freq: Two times a day (BID) | ORAL | Status: DC
  Administered 2019-11-23 – 2019-11-27 (×8): 250 mg via ORAL
  Filled 2019-11-23 (×9): qty 1

## 2019-11-23 NOTE — Progress Notes (Signed)
PROGRESS NOTE    Deborah Foster  YTK:[id number] DOB: 1970-02-28 DOA: 11/20/2019 PCP: [name], MD   Brief Narrative:  The patient is a 49 year old [place] speaking female with a past medical history significant for but not limited to metastatic lung cancer metastasis to the brain, metastasis to the adrenal gland, bone metastasis and other comorbidities including but not limited to hemoptysis, history of upper respiratory infection history of radiation to the brain and chest and left hip as well as other comorbidities who sees Dr. Julien Nordmann as an outpatient who presents to the ED with intractable nausea, vomiting and back pain.  She started having vomiting with episodic left lower quadrant pain and was unable to drink and eat well because of the worsening nausea vomiting and feels very weak and tired.  She reported that her abdominal pain was resolved at the time of her admission but she was having 8 out of 10 pain in her back.  She complains of pain in the right rib.  Denies any fever, chills, shortness of breath or any other symptoms associated.  On arrival to the ED she is found to have a temperature of 97.7 and blood work showed that she had a serum glucose of 113.  Is started on normal saline, given IV Zofran and IV morphine is being admitted.  **Interim History Sodium is slowly coming up and was the same as yesterday AM. Continues to have Abdominal and Back Pain and not feeling as well. Will continue IVF Hydration and replete Electrolytes.   Assessment & Plan:   Principal Problem:   Hyponatremia Active Problems:   Non-small cell carcinoma of lung, stage 4 (HCC)   Nausea and vomiting   Back pain  Hyponatremia/Hypochloremia  -Sodium level was 113 on arrival to the hospital. Now slowly improving and is 118 yesterday and was again 128 this AM  -Continue IV normal saline but reduced the rate of 100 mL/h to 50 mL/hr yesterday to avoid over correction but will increase to 75 mL/hr  today  -Continue to monitor BMP every 4 hour.  -Goal is to correct sodium about 8 to 10 mEq in 24 hours.  -Hyponatremia with hypochloremia is most likely secondary to dehydration and poor oral intake in the setting of severe nausea and vomiting.  -Urine Osm not done but Serum Som was 244 -Urine Sodium was 110 and Urine Cr was 53.88 -Repeat BMP yesterday was 126 -Continue to monitor carefully  Non-Small Cell Carcinoma of Lung, stage 4 (HCC) -Patient is s/p chemotherapy.  -Cancer is already metastasized to brain, bones and adrenals.  -Pain medications according to the pain scales ordered -CT Scan showed "Fairly consolidated region of lung in the medial right lung base with some features of post treatment related effect and fibrosis albeit with several fluid-filled airways in the non aerated portion of the lung. Findings are favored to reflect post treatment related effect though superimposed aspiration may be present. Slight prominence of the left parametrial and gonadal vessels, which can be seen with pelvic congestion syndrome in the appropriate clinical setting. Aortic Atherosclerosis." -Resumed Home Osimertinib Mesylate 80 mg po Daily -Will notify Dr. Julien Nordmann about patient's hospitalization. Have discussed with Oncology NP  Sinus Tachycardia -Patient's HR is elevated in the setting of Pain and Dehydration -C/w IVF at rate agove and gave a bolus of NS at 500 mL x1; Already received a 1 Liter NS bolus -Continue to Monitor on Telemetry   Nausea and Vomiting, improved  Abdominal Pain, persistent  -  Nausea and vomiting is improved at this time.  -IV Zofran every 6 hours as needed for nausea and vomiting -Diet Started to FULL Liquid Diet and will go to SOFT -CT Abd/Pelvis showed "Hypoattenuation of the upper poles and medial kidneys bilaterally with some mild bilateral urothelial thickening, left slightly greater than right. Could be related to radiation related effects though could  correlate with urinalysis to exclude urinary tract infection. Redemonstrated patchy enhancement of the pancreas with a slightly lobular, thickened appearance albeit without focal lesion or pancreatic ductal dilatation. Some surrounding stranding seen in the adjacent retroperitoneum. While this could reflect radiation related changes a pancreatitis could present similarly. Correlate with lipase. Slight mucosal hyperemia throughout the first portion of the duodenum is nonspecific and similar to the comparison exam. Possibly within a radiation field as well with additional inflammatory changes in the upper abdomen and retroperitoneum.  Low-attenuation ascites similar to only minimally increased in volume from prior with some redistribution including to the subphrenic spaces." -Will check Lipase Level given her Abdominal Pain; Lipase Level went from 34 -> 22 -Has some Abdominal Pain but it improves after having a bowel movement and urination  -IVF rate reduced to 50 mL in order to prevent over correction but will increase to 75 mL/hr  Back Pain -Tylenol as needed for mild pain, Norco as needed for moderate pain while Dilaudid as needed for severe pain ordered. -May need Further Imaging if persists  Hypokalemia -Patient's K+ this AM was 3.3 -Repelte with IV KPhos 20 mmol -Continue to [place] and Replete as Necessary -Repeat CMP in the AM   Hypophosphatemia -Patient's Phos Level is 1.9 -Replete with IV KPhos 20 mmol -Continue to Monitor and Replete as Necessary -Repeat CMP in AM   Constipation -Bowel Regimen started -C/w Senna-Docusate 1 tab po BID, Miralax 17 grams BID, and Bisacodyl 10 mg Suppository PRN -Has some abdominal pain which is improved with defecation  -Continues to have Abdominal Pain  Hypocalcemia -Patient's Ca2+ is now 7.0 -Replete with IV Calcium Gluconate 1 grams  -Check Ionized Calcium -Check Albumin Level in the AM for Calcium Correction; Corrected Ca2+ is now  8.3 -Continue to Monitor and repeat Ca2+ in the AM   Metabolic Acidosis -Mild. CO2 was 20, AG was 9, Chloride Level was 99 yesterday; Now CO2 is 21, AG is 7, and Chloride is 100 -C/w IVF as above and increased rate to 75 mL/hr -Continue to Monitor and Repeat CMP in the AM   Normocytic Anemia -Patient's Hgb/Hct has gone from 10.8/31.2 -> 10.0/30.0 -> 8.7/25.6 -Check Anemia Panel in the AM -Continue to Monitor for S/Sx of Bleeding; Currently no overt bleeding noted -Repeat CBC in the AM   Hypophosphatemia -Patient's Phos Level was 1.9 -Replete with IV K Phos 20 mmol -Continue to Monitor and Replete as Necessary -Repeat Phos Level in the AM   Leukocytosis -Patient's WBC went from 7.2 -> 12.3 and likely reative and improved back to 8.4 -Currently Afebrile. Continue to Monitor for S/Sx of Infection -Repeat CBC in the AM   Seizure Disorder -C/w Levetiracetam 250 mg po BID  Underweight -Estimated body mass index is 17.17 kg/m as calculated from the following:   Height as of this encounter: 4\' 11"  (1.499 m).   Weight as of this encounter: 38.6 kg. -Will consult Nutrition for further evaluation    DVT prophylaxis: Enoxaparin 40 mg sq q24h Code Status: FULL CODE  Family Communication: No family present at bedside  Disposition Plan: Pending further clinical improvement and  tolerance of diet po  Status is: Inpatient  Remains inpatient appropriate because:Unsafe d/c plan, IV treatments appropriate due to intensity of illness or inability to take PO and Inpatient level of care appropriate due to severity of illness   Dispo: The patient is from: Home              Anticipated d/c is to: Home              Anticipated d/c date is: 1 day              Patient currently is not medically stable to d/c.  Consultants:   Notified Medical Oncology   Procedures: None  Antimicrobials:  Anti-infectives (From admission, onward)   None       Subjective: Seen And examined at bedside  and she is not doing as well today as she was yesterday.  Denies any chest pain, lightheadedness but continues to have abdominal pain and back pain.  Not as hungry today was a little nauseous. Had a large bowel movement yesterday and has not had one this AM. No other concerns or complaints at this time.   Objective: Vitals:   11/22/19 1550 11/22/19 2036 11/23/19 0009 11/23/19 0445  BP: 99/61 (!) 94/58 (!) 97/58 (!) 97/56  Pulse: (!) 120 (!) 107 (!) 104 100  Resp: 16 16 16 14   Temp: 97.6 F (36.4 C) 97.9 F (36.6 C) 97.8 F (36.6 C) 98 F (36.7 C)  TempSrc: Oral Oral Oral Oral  SpO2: 100% 100% 100% 98%  Weight:      Height:        Intake/Output Summary (Last 24 hours) at 11/23/2019 0759 Last data filed at 11/23/2019 1610 Gross per 24 hour  Intake 1637.22 ml  Output --  Net 1637.22 ml   Filed Weights   11/21/19 1120  Weight: 38.6 kg   Examination: Physical Exam:  Constitutional: Thin Chronically ill appearing female in NAD appears calm but does appear uncomfortable  Eyes: Lids and conjunctivae normal, sclerae anicteric  ENMT: External Ears, Nose appear normal. Grossly normal hearing.  Neck: Appears normal, supple, no cervical masses, normal ROM, no appreciable thyromegaly; no JVD Respiratory: Diminished to auscultation bilaterally, no wheezing, rales, rhonchi or crackles. Normal respiratory effort and patient is not tachypenic. No accessory muscle use. Unlabored breathing  Cardiovascular: RRR, no murmurs / rubs / gallops. S1 and S2 auscultated. No appreciable [place] edema Abdomen: Soft, non-tender, non-distended. Bowel sounds positive x4.  GU: Deferred. Musculoskeletal: No clubbing / cyanosis of digits/nails. No joint deformity upper and lower extremities.  Skin: No rashes, lesions, ulcers on a limited skin evaluation. No induration; Warm and dry.  Neurologic: CN 2-12 grossly intact with no focal deficits. Romberg sign and cerebellar reflexes not assessed.  Psychiatric: Normal  judgment and insight. Alert and oriented x 3. Normal mood and appropriate affect.   Data Reviewed: I have personally reviewed following labs and imaging studies  CBC: Recent Labs  Lab 11/20/19 2300 11/21/19 0303 11/22/19 0626 11/23/19 0544  WBC 6.0 7.2 12.3* 8.4  NEUTROABS  --   --  11.4* 7.5  HGB 10.8* 10.8* 10.0* 8.7*  HCT 30.3* 31.2* 30.0* 25.6*  MCV 87.8 87.2 90.9 89.8  PLT 332 339 324 960   Basic Metabolic Panel: Recent Labs  Lab 11/21/19 0746 11/21/19 1229 11/22/19 0626 11/22/19 1351 11/23/19 0544  NA 115* 118* 128* 126* 128*  K 4.8 3.9 3.8 3.9 3.3*  CL 85* 90* 99 100 100  CO2 22  21* 20* 18* 21*  GLUCOSE 86 98 63* 132* 97  BUN 7 9 6 7  5*  CREATININE 0.50 0.38* 0.46 0.50 0.35*  CALCIUM 7.2* 6.2* 7.7* 7.1* 7.0*  MG  --   --  2.2  --  2.1  PHOS  --   --  1.9*  --  1.9*   GFR: Estimated Creatinine Clearance: 53 mL/min (A) (by C-G formula based on SCr of 0.35 mg/dL (L)). Liver Function Tests: Recent Labs  Lab 11/20/19 2300 11/22/19 0626 11/23/19 0544  AST 29 19 14*  ALT 34 24 19  ALKPHOS 74 66 61  BILITOT 0.6 0.9 0.3  PROT 7.3 6.3* 5.4*  ALBUMIN 3.6 2.8* 2.4*   Recent Labs  Lab 11/20/19 2300 11/22/19 0623  LIPASE 34 22   No results for input(s): AMMONIA in the last 168 hours. Coagulation Profile: No results for input(s): INR, PROTIME in the last 168 hours. Cardiac Enzymes: No results for input(s): CKTOTAL, CKMB, CKMBINDEX, TROPONINI in the last 168 hours. BNP (last 3 results) No results for input(s): PROBNP in the last 8760 hours. HbA1C: No results for input(s): HGBA1C in the last 72 hours. CBG: No results for input(s): GLUCAP in the last 168 hours. Lipid Profile: No results for input(s): CHOL, HDL, LDLCALC, TRIG, CHOLHDL, LDLDIRECT in the last 72 hours. Thyroid Function Tests: No results for input(s): TSH, T4TOTAL, FREET4, T3FREE, THYROIDAB in the last 72 hours. Anemia Panel: No results for input(s): VITAMINB12, FOLATE, FERRITIN, TIBC, IRON,  RETICCTPCT in the last 72 hours. Sepsis Labs: No results for input(s): PROCALCITON, LATICACIDVEN in the last 168 hours.  Recent Results (from the past 240 hour(s))  Respiratory Panel by RT PCR (Flu A&B, Covid) - Nasopharyngeal Swab     Status: None   Collection Time: 11/21/19  2:38 AM   Specimen: Nasopharyngeal Swab  Result Value Ref Range Status   SARS Coronavirus 2 by RT PCR NEGATIVE NEGATIVE Final    Comment: (NOTE) SARS-CoV-2 target nucleic acids are NOT DETECTED.  The SARS-CoV-2 RNA is generally detectable in upper respiratoy specimens during the acute phase of infection. The lowest concentration of SARS-CoV-2 viral copies this assay can detect is 131 copies/mL. A negative result does not preclude SARS-Cov-2 infection and should not be used as the sole basis for treatment or other patient management decisions. A negative result may occur with  improper specimen collection/handling, submission of specimen other than nasopharyngeal swab, presence of viral mutation(s) within the areas targeted by this assay, and inadequate number of viral copies (<131 copies/mL). A negative result must be combined with clinical observations, patient history, and epidemiological information. The expected result is Negative.  Fact Sheet for Patients:  PinkCheek.be  Fact Sheet for Healthcare Providers:  GravelBags.it  This test is no t yet approved or cleared by the [place] FDA and  has been authorized for detection and/or diagnosis of SARS-CoV-2 by FDA under an Emergency Use Authorization (EUA). This EUA will remain  in effect (meaning this test can be used) for the duration of the COVID-19 declaration under Section 564(b)(1) of the Act, 21 U.S.C. section 360bbb-3(b)(1), unless the authorization is terminated or revoked sooner.     Influenza A by PCR NEGATIVE NEGATIVE Final   Influenza B by PCR NEGATIVE NEGATIVE Final     Comment: (NOTE) The Xpert Xpress SARS-CoV-2/FLU/RSV assay is intended as an aid in  the diagnosis of influenza from Nasopharyngeal swab specimens and  should not be used as a sole basis for treatment. Nasal washings  and  aspirates are unacceptable for Xpert Xpress SARS-CoV-2/FLU/RSV  testing.  Fact Sheet for Patients: PinkCheek.be  Fact Sheet for Healthcare Providers: GravelBags.it  This test is not yet approved or cleared by the [place] FDA and  has been authorized for detection and/or diagnosis of SARS-CoV-2 by  FDA under an Emergency Use Authorization (EUA). This EUA will remain  in effect (meaning this test can be used) for the duration of the  Covid-19 declaration under Section 564(b)(1) of the Act, 21  U.S.C. section 360bbb-3(b)(1), unless the authorization is  terminated or revoked. Performed at [facility], [place] [address] [place], [place] [address]     RN Pressure Injury Documentation:     Estimated body mass index is 17.17 kg/m as calculated from the following:   Height as of this encounter: 4\' 11"  (1.499 m).   Weight as of this encounter: 38.6 kg.  Malnutrition Type:   Malnutrition Characteristics:   Nutrition Interventions:   Radiology Studies: No results found. Scheduled Meds:  enoxaparin (LOVENOX) injection  30 mg Subcutaneous Q24H   levETIRAcetam  250 mg Oral BID   osimertinib mesylate  80 mg Oral Daily   polyethylene glycol  17 g Oral BID   senna-docusate  1 tablet Oral BID   Continuous Infusions:  sodium chloride 50 mL/hr at 11/23/19 4403   calcium gluconate     potassium PHOSPHATE IVPB (in mmol)      LOS: 2 days   Kerney Elbe, DO [facility] PAGER is on [place]  If 7PM-7AM, please contact night-coverage [email or web address]

## 2019-11-23 NOTE — Progress Notes (Signed)
Patient complained of a tingling/numbness sensation in her BUE. No change in strength or sensation noted. Dr. Alfredia Ferguson notified and new orders placed. Will continue to monitor.

## 2019-11-23 NOTE — Progress Notes (Addendum)
Initial Nutrition Assessment  DOCUMENTATION CODES:   Underweight  INTERVENTION:  - will order Boost Breeze BID, each supplement provides 250 kcal and 9 grams of protein. - will order Magic Cup with lunch meals, each supplement provides 290 kcal and 9 grams of protein - will order 1 tablet multivitamin with minerals/day. - will complete NFPE at follow-up.  - recommend zofran be changed from PRN to scheduled with meals.    NUTRITION DIAGNOSIS:   Increased nutrient needs related to catabolic illness, cancer and cancer related treatments as evidenced by estimated needs.  GOAL:   Patient will meet greater than or equal to 90% of their needs  MONITOR:   PO intake, Supplement acceptance, Labs, Weight trends  REASON FOR ASSESSMENT:   Malnutrition Screening Tool, Consult Assessment of nutrition requirement/status, Poor PO  ASSESSMENT:   49 year old [place] female with medical history of stage 4 NSCLC with mets to brain, bone, and adrenal gland. She presented to the ED due to intractable N/V and back pain that is 8/10 on pain scale. She was feeling very weak and tired prior to presenting to the ED.  She ate 75% of breakfast and 85% of lunch today (total of 943 kcal, 27 grams protein).   Patient laying in bed with no family/visitors present. She is noted to speak [place] but she is able to speak and understand English as long as RD talks slower and uses simpler terms.   She reports that she often feels hungry but if she eats she becomes nauseated. She was previously told to use antacid to aid with this, but that it often leads to abdominal pain.   Patient reports eating spaghetti and broccoli for lunch today at around 1430 and that she is now feeling fairly nauseated. She has had ongoing back and abdominal pain also.  Patient reports not sleeping well and would like to try to rest now, but is interested in RD stopping back tomorrow (11/9).   Weight on 11/6 was 85 lb  and it appears that weight has been stable since 07/07/19. Suspect some degree of malnutrition based on BMI and visualization of patient, but unable to confirm at this time.   Per notes: - hyponatremia--improving - stage 4 NSCLC s/p chemo - N/V--improved - ongoing abdominal pain--improved with urination and BMs - constipation   Labs reviewed; Na: 128 mmol/l, K: 3.3 mmol/l, BUN: 5 mg/dl, creatinine: 0.35 mg/dl, Ca: 7 mg/dl, Phos: 1.9 mg/dl. Medications reviewed; 17 g miralax BID, 20 mmol IV KPhos x1 run 11/7 x1 dose 11/8, 1 tablet senokot BID, PRN zofran.  IVF; NS @ 75 ml/hr.     NUTRITION - FOCUSED PHYSICAL EXAM:  will complete at follow-up.   Diet Order:   Diet Order            DIET SOFT Room service appropriate? Yes; Fluid consistency: Thin  Diet effective now                 EDUCATION NEEDS:   No education needs have been identified at this time  Skin:  Skin Assessment: Reviewed RN Assessment  Last BM:  11/7 (type 1 x1)  Height:   Ht Readings from Last 1 Encounters:  11/21/19 4\' 11"  (1.499 m)    Weight:   Wt Readings from Last 1 Encounters:  11/21/19 38.6 kg     Estimated Nutritional Needs:  Kcal:  1545-1740 kcal Protein:  70-80 grams Fluid:  >/= 1.8 L/day     Jarome Matin, MS, RD, LDN, CNSC  Inpatient Clinical Dietitian RD pager # available in [place]  After hours/weekend pager # available in [facility]

## 2019-11-24 ENCOUNTER — Other Ambulatory Visit: Payer: Self-pay | Admitting: Oncology

## 2019-11-24 DIAGNOSIS — R000 Tachycardia, unspecified: Secondary | ICD-10-CM

## 2019-11-24 DIAGNOSIS — M549 Dorsalgia, unspecified: Secondary | ICD-10-CM

## 2019-11-24 DIAGNOSIS — K5900 Constipation, unspecified: Secondary | ICD-10-CM

## 2019-11-24 DIAGNOSIS — E872 Acidosis, unspecified: Secondary | ICD-10-CM

## 2019-11-24 DIAGNOSIS — E860 Dehydration: Secondary | ICD-10-CM

## 2019-11-24 DIAGNOSIS — E876 Hypokalemia: Secondary | ICD-10-CM

## 2019-11-24 DIAGNOSIS — G40909 Epilepsy, unspecified, not intractable, without status epilepticus: Secondary | ICD-10-CM

## 2019-11-24 DIAGNOSIS — C3431 Malignant neoplasm of lower lobe, right bronchus or lung: Secondary | ICD-10-CM

## 2019-11-24 LAB — FERRITIN: Ferritin: 98 ng/mL (ref 11–307)

## 2019-11-24 LAB — RETICULOCYTES
Immature Retic Fract: 9.5 % (ref 2.3–15.9)
RBC.: 3.43 MIL/uL — ABNORMAL LOW (ref 3.87–5.11)
Retic Count, Absolute: 56.9 10*3/uL (ref 19.0–186.0)
Retic Ct Pct: 1.7 % (ref 0.4–3.1)

## 2019-11-24 LAB — IRON AND TIBC
Iron: 27 ug/dL — ABNORMAL LOW (ref 28–170)
Saturation Ratios: 11 % (ref 10.4–31.8)
TIBC: 236 ug/dL — ABNORMAL LOW (ref 250–450)
UIBC: 209 ug/dL

## 2019-11-24 LAB — MAGNESIUM: Magnesium: 2.3 mg/dL (ref 1.7–2.4)

## 2019-11-24 LAB — CBC WITH DIFFERENTIAL/PLATELET
Abs Immature Granulocytes: 0.02 10*3/uL (ref 0.00–0.07)
Basophils Absolute: 0 10*3/uL (ref 0.0–0.1)
Basophils Relative: 0 %
Eosinophils Absolute: 0 10*3/uL (ref 0.0–0.5)
Eosinophils Relative: 1 %
HCT: 30.9 % — ABNORMAL LOW (ref 36.0–46.0)
Hemoglobin: 10.5 g/dL — ABNORMAL LOW (ref 12.0–15.0)
Immature Granulocytes: 0 %
Lymphocytes Relative: 7 %
Lymphs Abs: 0.4 10*3/uL — ABNORMAL LOW (ref 0.7–4.0)
MCH: 30.8 pg (ref 26.0–34.0)
MCHC: 34 g/dL (ref 30.0–36.0)
MCV: 90.6 fL (ref 80.0–100.0)
Monocytes Absolute: 0.4 10*3/uL (ref 0.1–1.0)
Monocytes Relative: 7 %
Neutro Abs: 4.9 10*3/uL (ref 1.7–7.7)
Neutrophils Relative %: 85 %
Platelets: 327 10*3/uL (ref 150–400)
RBC: 3.41 MIL/uL — ABNORMAL LOW (ref 3.87–5.11)
RDW: 14.3 % (ref 11.5–15.5)
WBC: 5.8 10*3/uL (ref 4.0–10.5)
nRBC: 0 % (ref 0.0–0.2)

## 2019-11-24 LAB — COMPREHENSIVE METABOLIC PANEL
ALT: 24 U/L (ref 0–44)
AST: 22 U/L (ref 15–41)
Albumin: 3 g/dL — ABNORMAL LOW (ref 3.5–5.0)
Alkaline Phosphatase: 80 U/L (ref 38–126)
Anion gap: 9 (ref 5–15)
BUN: <5 mg/dL — ABNORMAL LOW (ref 6–20)
CO2: 24 mmol/L (ref 22–32)
Calcium: 8.2 mg/dL — ABNORMAL LOW (ref 8.9–10.3)
Chloride: 98 mmol/L (ref 98–111)
Creatinine, Ser: 0.45 mg/dL (ref 0.44–1.00)
GFR, Estimated: >60 mL/min (ref >60)
Glucose, Bld: 105 mg/dL — ABNORMAL HIGH (ref 70–99)
Potassium: 3.8 mmol/L (ref 3.5–5.1)
Sodium: 131 mmol/L — ABNORMAL LOW (ref 135–145)
Total Bilirubin: 0.5 mg/dL (ref 0.3–1.2)
Total Protein: 6.8 g/dL (ref 6.5–8.1)

## 2019-11-24 LAB — HEMOGLOBIN A1C
Hgb A1c MFr Bld: 5.1 % (ref 4.8–5.6)
Mean Plasma Glucose: 99.67 mg/dL

## 2019-11-24 LAB — FOLATE: Folate: 22.7 ng/mL (ref >5.9)

## 2019-11-24 LAB — VITAMIN B12: Vitamin B-12: 4026 pg/mL — ABNORMAL HIGH (ref 180–914)

## 2019-11-24 LAB — PHOSPHORUS: Phosphorus: 2.9 mg/dL (ref 2.5–4.6)

## 2019-11-24 MED ORDER — CALCIUM CARBONATE-VITAMIN D 500-200 MG-UNIT PO TABS
1.0000 | ORAL_TABLET | Freq: Three times a day (TID) | ORAL | Status: DC
  Administered 2019-11-24 – 2019-11-27 (×8): 1 via ORAL
  Filled 2019-11-24 (×9): qty 1

## 2019-11-24 NOTE — Progress Notes (Signed)
HEMATOLOGY-ONCOLOGY PROGRESS NOTE  SUBJECTIVE: Deborah Foster presented to the emergency room with intractable nausea, vomiting, and back pain.  On admission, her sodium was low at 113.  She was given IV fluids, IV Zofran, and IV morphine for her abdominal and back pain.  A CT of the abdomen pelvis has been performed this admission.  The CT did not show any evidence of malignancy.  There was some patchy enhancement of the pancreas without focal lesion or pancreatic ductal dilatation and findings could be concerning for radiation related pancreatitis.  Lipase was not elevated.  Labs from today were reviewed and sodium is improving at up to 131 today.  She has no transaminitis or hyperbilirubinemia.  She has no leukocytosis or thrombocytopenia.  She has mild anemia.  She continues to report abdominal discomfort and back pain today.  When asked to point where she hurts the most, she points to her upper abdomen.  She reports nausea but is not vomiting.  She is taking hydrocodone 3-4 times a day.  Not currently using IV pain medication.  She is on scheduled Zofran.  She is currently on a soft diet.  REVIEW OF SYSTEMS:   Constitutional: Denies fevers, chills  Respiratory: Denies cough, dyspnea or wheezes Cardiovascular: Denies palpitation, chest discomfort Gastrointestinal: Reports abdominal pain and nausea Skin: Denies abnormal skin rashes Lymphatics: Denies new lymphadenopathy or easy bruising Neurological:Denies numbness, tingling or new weaknesses Behavioral/Psych: Mood is stable, no new changes  Extremities: No lower extremity edema All other systems were reviewed with the patient and are negative.  I have reviewed the past medical history, past surgical history, social history and family history with the patient and they are unchanged from previous note.   PHYSICAL EXAMINATION: ECOG PERFORMANCE STATUS: 1 - Symptomatic but completely ambulatory  Vitals:   11/24/19 0512 11/24/19 1130  BP: 121/69  107/64  Pulse: (!) 110 (!) 106  Resp: 20 20  Temp: 98.4 F (36.9 C) 98.4 F (36.9 C)  SpO2: 99% 98%   Filed Weights   11/21/19 1120  Weight: 38.6 kg    Intake/Output from previous day: 11/08 0701 - 11/09 0700 In: 1111.6 [P.O.:240; I.V.:610.1; IV Piggyback:261.5] Out: -   GENERAL:alert, no distress and comfortable SKIN: skin color, texture, turgor are normal, no rashes or significant lesions LUNGS: clear to auscultation and percussion with normal breathing effort HEART: regular rate & rhythm and no murmurs and no lower extremity edema ABDOMEN: Positive bowel sounds, soft, tenderness with light palpation Musculoskeletal:no cyanosis of digits and no clubbing  NEURO: alert & oriented x 3 with fluent speech, no focal motor/sensory deficits  LABORATORY DATA:  I have reviewed the data as listed CMP Latest Ref Rng & Units 11/24/2019 11/23/2019 11/22/2019  Glucose 70 - 99 mg/dL 105(H) 97 132(H)  BUN 6 - 20 mg/dL <5(L) 5(L) 7  Creatinine 0.44 - 1.00 mg/dL 0.45 0.35(L) 0.50  Sodium 135 - 145 mmol/L 131(L) 128(L) 126(L)  Potassium 3.5 - 5.1 mmol/L 3.8 3.3(L) 3.9  Chloride 98 - 111 mmol/L 98 100 100  CO2 22 - 32 mmol/L 24 21(L) 18(L)  Calcium 8.9 - 10.3 mg/dL 8.2(L) 7.0(L) 7.1(L)  Total Protein 6.5 - 8.1 g/dL 6.8 5.4(L) -  Total Bilirubin 0.3 - 1.2 mg/dL 0.5 0.3 -  Alkaline Phos 38 - 126 U/L 80 61 -  AST 15 - 41 U/L 22 14(L) -  ALT 0 - 44 U/L 24 19 -    Lab Results  Component Value Date   WBC 5.8 11/24/2019  HGB 10.5 (L) 11/24/2019   HCT 30.9 (L) 11/24/2019   MCV 90.6 11/24/2019   PLT 327 11/24/2019   NEUTROABS 4.9 11/24/2019    CT Angio Chest PE W and/or Wo Contrast  Result Date: 11/07/2019 CLINICAL DATA:  Metastatic lung cancer with right-sided abdominal pain EXAM: CT ANGIOGRAPHY CHEST CT ABDOMEN AND PELVIS WITH CONTRAST TECHNIQUE: Multidetector CT imaging of the chest was performed using the standard protocol during bolus administration of intravenous contrast.  Multiplanar CT image reconstructions and MIPs were obtained to evaluate the vascular anatomy. Multidetector CT imaging of the abdomen and pelvis was performed using the standard protocol during bolus administration of intravenous contrast. CONTRAST:  193m OMNIPAQUE IOHEXOL 350 MG/ML SOLN COMPARISON:  10/09/2019 FINDINGS: CTA CHEST FINDINGS Cardiovascular: Normal heart size with no pericardial effusion. Large main pulmonary artery with presumed pulmonary hypertension. Asymmetric poor opacification of right lower lobe pulmonary arteries, likely hypoxic mediated shunting due to the degree of scarring. No pulmonary emboli are seen. Mediastinum/Nodes: No lymphadenopathy. Lungs/Pleura: Radiation fibrosis appearance to the right upper, right middle, and right lower lobes with traction bronchiectasis and architectural distortion, unchanged. Trace pleural fluid on the right. Clear left lung. Musculoskeletal: Area of sclerosis to the lateral right third rib and medial right clavicle, stable and possibly radiation osteitis or metastatic disease, stable. No acute fracture. Review of the MIP images confirms the above findings. CT ABDOMEN and PELVIS FINDINGS Hepatobiliary: Band of hypointensity in the inferior right lobe liver, given constellation of findings suspect radiation changes. Mild chronic intra and extrahepatic bile duct dilatation. Small cystic densities in the subcapsular liver. Pancreas: Patchy increased enhancement in the pancreatic body which is mildly expanded. Spleen: Small spleen without focal abnormality Adrenals/Urinary Tract: Negative adrenals. Best seen on coronal reformats there is hypoenhancement in the medullary spaces of the upper pole kidneys where there is also less cortical thickness. Prominent urothelial thickness along the bilateral upper ureters, similar to prior. Unremarkable bladder. Stomach/Bowel: No definite bowel inflammation and no bowel obstruction. Vascular/Lymphatic: Atheromatous plaque  with stable strandy density around the aorta. No mass or adenopathy. Reproductive:No pathologic findings. Other: Small volume pelvic ascites considered reactive. Musculoskeletal: Osseous metastatic disease with most confluent sclerosis in left acetabulum where there is expansion. No soft tissue tumor extension or pathologic fracture is seen to explain the history. Review of the MIP images confirms the above findings. IMPRESSION: Chest CTA: 1. No evidence of pulmonary embolism. 2. Stable radiation changes in the right lung with trace pleural fluid. Abdominal CT: 1. Band of abnormality affecting the liver, pancreas, and upper pole kidneys likely related to adrenal radiation (if administered). Please correlate with labs for pancreatitis. 2. Known osseous metastatic disease without acute fracture. 3. Prominent bilateral upper ureteral thickness, stable and without hydronephrosis. Electronically Signed   By: JMonte FantasiaM.D.   On: 11/07/2019 10:38   CT ABDOMEN PELVIS W CONTRAST  Result Date: 11/21/2019 CLINICAL DATA:  Known lung cancer, on oral chemotherapy, emesis since since yesterday EXAM: CT ABDOMEN AND PELVIS WITH CONTRAST TECHNIQUE: Multidetector CT imaging of the abdomen and pelvis was performed using the standard protocol following bolus administration of intravenous contrast. CONTRAST:  741mOMNIPAQUE IOHEXOL 300 MG/ML  SOLN COMPARISON:  CT chest, abdomen and pelvis 11/07/2019 FINDINGS: Lower chest: Fairly consolidated region of lung in the medial right lung base with some features of post treatment related effect and fibrosis albeit with several fluid-filled airways in the non aerated portion of the lung. Small volume right pleural fluid is noted as well. Left  base is clear. Stable sclerotic focus in lateral right sixth rib. Normal heart size. No pericardial effusion. Trace pericardial fluid is unchanged from prior. Hepatobiliary: Redemonstrated bandlike region of parenchymal hypoattenuation extending  throughout the right lobe liver. Several scattered subcentimeter hypoattenuating foci are present throughout the liver, also unchanged from priors. Some mild intra and extrahepatic biliary ductal dilatation is similar to comparison studies without visible intraductal gallstones or other filling defect evident. Gallbladder Lea partially distended without significant wall thickening but with some fluid localized to the porta hepatis gallbladder fossa. Pancreas: Redemonstrated patchy enhancement of the pancreas with a slightly lobular, thickened appearance albeit without focal lesion or pancreatic ductal dilatation. Some surrounding stranding seen in the adjacent retroperitoneum. Spleen: Diminutive spleen. No focal lesion. Small volume perisplenic free fluid seen posteriorly. Adrenals/Urinary Tract: Normal adrenal glands. Increasingly conspicuous hypoattenuation of the upper poles and medial kidneys bilaterally. Additionally, there is some mild bilateral urothelial thickening, left slightly greater than right. No focal liver lesion is seen. No urolithiasis. Urinary bladder is largely decompressed at the time of exam and therefore poorly evaluated by CT imaging. Some mild bladder wall thickening is nonspecific. Stomach/Bowel: Distal esophagus is unremarkable. Some thickening of the gastric antrum likely related to normal peristalsis. Some slight mucosal hyperemia throughout the first portion of the duodenum is nonspecific and similar to the comparison exam. Some adjacent inflammatory changes may be redistributed. No other small bowel thickening or conspicuous enhancement. No dilatation or evidence of obstruction. A normal appendix is visualized. No colonic dilatation or wall thickening. Vascular/Lymphatic: Atherosclerotic calcifications within the abdominal aorta and branch vessels. No aneurysm or ectasia. No enlarged abdominopelvic lymph nodes. Some reactive appearing and possibly edematous nodes in the upper abdomen.  Reproductive: Slight prominence of the left parametrial and gonadal vessels. Otherwise normal appearance of the anteverted uterus without other concerning adnexal abnormality. Other: Increasing upper abdominal edematous changes predominantly centered in the central mesentery and retroperitoneum. No free air. No bowel containing hernia. Low-attenuation ascites similar to only minimally increased in volume from prior with some redistribution including to the subphrenic spaces. Musculoskeletal: Redemonstration of the diffuse osseous metastatic disease including sclerotic foci in the spine and more expansile lytic and sclerotic appearance of the left ilium and acetabulum. IMPRESSION: 1. Hypoattenuation of the upper poles and medial kidneys bilaterally with some mild bilateral urothelial thickening, left slightly greater than right. Could be related to radiation related effects though could correlate with urinalysis to exclude urinary tract infection. 2. Redemonstrated patchy enhancement of the pancreas with a slightly lobular, thickened appearance albeit without focal lesion or pancreatic ductal dilatation. Some surrounding stranding seen in the adjacent retroperitoneum. While this could reflect radiation related changes a pancreatitis could present similarly. Correlate with lipase. 3. Slight mucosal hyperemia throughout the first portion of the duodenum is nonspecific and similar to the comparison exam. Possibly within a radiation field as well with additional inflammatory changes in the upper abdomen and retroperitoneum. 4. Low-attenuation ascites similar to only minimally increased in volume from prior with some redistribution including to the subphrenic spaces. 5. Fairly consolidated region of lung in the medial right lung base with some features of post treatment related effect and fibrosis albeit with several fluid-filled airways in the non aerated portion of the lung. Findings are favored to reflect post  treatment related effect though superimposed aspiration may be present. 6. Slight prominence of the left parametrial and gonadal vessels, which can be seen with pelvic congestion syndrome in the appropriate clinical setting. 7. Aortic Atherosclerosis (ICD10-I70.0).  Electronically Signed   By: Lovena Wojciak M.D.   On: 11/21/2019 02:00   CT ABDOMEN PELVIS W CONTRAST  Result Date: 11/07/2019 CLINICAL DATA:  Metastatic lung cancer with right-sided abdominal pain EXAM: CT ANGIOGRAPHY CHEST CT ABDOMEN AND PELVIS WITH CONTRAST TECHNIQUE: Multidetector CT imaging of the chest was performed using the standard protocol during bolus administration of intravenous contrast. Multiplanar CT image reconstructions and MIPs were obtained to evaluate the vascular anatomy. Multidetector CT imaging of the abdomen and pelvis was performed using the standard protocol during bolus administration of intravenous contrast. CONTRAST:  179m OMNIPAQUE IOHEXOL 350 MG/ML SOLN COMPARISON:  10/09/2019 FINDINGS: CTA CHEST FINDINGS Cardiovascular: Normal heart size with no pericardial effusion. Large main pulmonary artery with presumed pulmonary hypertension. Asymmetric poor opacification of right lower lobe pulmonary arteries, likely hypoxic mediated shunting due to the degree of scarring. No pulmonary emboli are seen. Mediastinum/Nodes: No lymphadenopathy. Lungs/Pleura: Radiation fibrosis appearance to the right upper, right middle, and right lower lobes with traction bronchiectasis and architectural distortion, unchanged. Trace pleural fluid on the right. Clear left lung. Musculoskeletal: Area of sclerosis to the lateral right third rib and medial right clavicle, stable and possibly radiation osteitis or metastatic disease, stable. No acute fracture. Review of the MIP images confirms the above findings. CT ABDOMEN and PELVIS FINDINGS Hepatobiliary: Band of hypointensity in the inferior right lobe liver, given constellation of findings suspect  radiation changes. Mild chronic intra and extrahepatic bile duct dilatation. Small cystic densities in the subcapsular liver. Pancreas: Patchy increased enhancement in the pancreatic body which is mildly expanded. Spleen: Small spleen without focal abnormality Adrenals/Urinary Tract: Negative adrenals. Best seen on coronal reformats there is hypoenhancement in the medullary spaces of the upper pole kidneys where there is also less cortical thickness. Prominent urothelial thickness along the bilateral upper ureters, similar to prior. Unremarkable bladder. Stomach/Bowel: No definite bowel inflammation and no bowel obstruction. Vascular/Lymphatic: Atheromatous plaque with stable strandy density around the aorta. No mass or adenopathy. Reproductive:No pathologic findings. Other: Small volume pelvic ascites considered reactive. Musculoskeletal: Osseous metastatic disease with most confluent sclerosis in left acetabulum where there is expansion. No soft tissue tumor extension or pathologic fracture is seen to explain the history. Review of the MIP images confirms the above findings. IMPRESSION: Chest CTA: 1. No evidence of pulmonary embolism. 2. Stable radiation changes in the right lung with trace pleural fluid. Abdominal CT: 1. Band of abnormality affecting the liver, pancreas, and upper pole kidneys likely related to adrenal radiation (if administered). Please correlate with labs for pancreatitis. 2. Known osseous metastatic disease without acute fracture. 3. Prominent bilateral upper ureteral thickness, stable and without hydronephrosis. Electronically Signed   By: JMonte FantasiaM.D.   On: 11/07/2019 10:38   DG Chest Port 1 View  Result Date: 11/07/2019 CLINICAL DATA:  Questionable sepsis EXAM: PORTABLE CHEST 1 VIEW COMPARISON:  08/03/2019 FINDINGS: Asymmetric volume loss and opacity on the right where there is radiation fibrosis by recent CT. The left chest is clear. Normal heart size and stable mediastinal  contours. No visible effusion or pneumothorax. IMPRESSION: Stable post treatment changes on the right. Electronically Signed   By: JMonte FantasiaM.D.   On: 11/07/2019 07:58    ASSESSMENT AND PLAN: This is a very pleasant 49year old Asian female with stage IV non-small cell lung cancer, adenocarcinoma with positive EGFR mutation with deletion in exon 17diagnosed in July 2016 status post treatment with Tarceva for a total of 18 months but this was  discontinued secondary to disease progression and development of T790M resistant mutation. The patient was started on treatment with Tagrisso 80 mg by mouth daily status post 38 months of treatment. She was also found to have MET amplification on repeat molecular studies. In addition to [place], the patient was a started on Xalkori 250 mg p.o. twice daily.  She had some side effect of this treatment with increasing fatigue and weakness as well as nausea and vomiting.  Xalkori was changed to 250 once daily at nighttime.  She could not tolerate her treatment with Hulda Humphrey and it is currently on hold for the last 55-month. The patient is feeling much better now and tolerating her treatment with Tagrisso fairly well. Recent restaging scans showed stable disease.  She continues on Tagrisso 80 mg daily.  XHulda Humphreywas not restarted due to intolerance.  Etiology of her intractable nausea, vomiting, abdominal pain, and back pain are unclear.  However, her nausea and pain seem to be well controlled with oral medications.  She is able to take in a soft diet.  From our standpoint, the patient may be discharged to home when her pain and nausea are well controlled and she is able to tolerate her diet.  She should continue on Tagrisso 80 mg daily upon discharge.  I have scheduled her for a lab and symptom management visit on 11/15 and she should keep her follow-up visit with Dr. MJulien Nordmannon 11/23.   LOS: 3 days   KMikey Bussing DNP, AGPCNP-BC, AOCNP 11/24/19

## 2019-11-24 NOTE — Progress Notes (Signed)
Nutrition Follow-up  DOCUMENTATION CODES:   Non-severe (moderate) malnutrition in context of chronic illness, Underweight  INTERVENTION:  - continue Boost Breeze BID and Magic Cup BID.   NUTRITION DIAGNOSIS:   Moderate Malnutrition related to chronic illness, cancer and cancer related treatments as evidenced by mild muscle depletion, moderate fat depletion, moderate muscle depletion. -revised  GOAL:   Patient will meet greater than or equal to 90% of their needs -unmet  MONITOR:   PO intake, Supplement acceptance, Labs, Weight trends  ASSESSMENT:   49 year old Vietnamese-speaking female with medical history of stage 4 NSCLC with mets to brain, bone, and adrenal gland. She presented to the ED due to intractable N/V and back pain that is 8/10 on pain scale. She was feeling very weak and tired prior to presenting to the ED.  No intakes documented from dinner last night or from meals today. Patient was sitting on the edge of her bed in street clothes after having gotten a shower. She is looking forward to her husband visiting shortly.   Patient did not have anything for breakfast and had ordered lunch, but was experiencing nausea and abdominal pain, so lunch tray remains untouched. She was given scheduled zofran and is now feeling better and starting to feel hungry.   RD offered to re-order lunch tray and patient would like spaghetti with meat sauce and sliced peaches.   She reports that she was told she will be hospitalized for 2-3 more days.   Boost Breeze was ordered BID yesterday and patient has accepted both cartons of this supplement offered to her. She prefers juice over milky items.   No MD note in yet today.    Labs reviewed; Na: 131 mmol/l, BUN: <5 mg/dl, Ca: 8.2 mg/dl. Medications reviewed; 1 tablet oscal-D TID, 1 tablet multivitamin with minerals/day, 4 mg IV zofran QID, 17 g miralax BID, 1 tablet senokot BID.  IVF; NS @ 75 ml/hr.     NUTRITION - FOCUSED PHYSICAL  EXAM:    Most Recent Value  Orbital Region Moderate depletion  Upper Arm Region Moderate depletion  Thoracic and Lumbar Region Unable to assess  Buccal Region Moderate depletion  Temple Region Mild depletion  Clavicle Bone Region Moderate depletion  Clavicle and Acromion Bone Region Severe depletion  Scapular Bone Region Severe depletion  Dorsal Hand Mild depletion  Patellar Region Mild depletion  Anterior Thigh Region Mild depletion  Posterior Calf Region Mild depletion  Edema (RD Assessment) None  Hair Reviewed  Eyes Reviewed  Mouth Reviewed  Skin Reviewed  Nails Reviewed       Diet Order:   Diet Order            DIET SOFT Room service appropriate? Yes; Fluid consistency: Thin  Diet effective now                 EDUCATION NEEDS:   No education needs have been identified at this time  Skin:  Skin Assessment: Reviewed RN Assessment  Last BM:  11/8  Height:   Ht Readings from Last 1 Encounters:  11/21/19 4\' 11"  (1.499 m)    Weight:   Wt Readings from Last 1 Encounters:  11/21/19 38.6 kg     Estimated Nutritional Needs:  Kcal:  1545-1740 kcal Protein:  70-80 grams Fluid:  >/= 1.8 L/day     Jarome Matin, MS, RD, LDN, CNSC Inpatient Clinical Dietitian RD pager # available in [facility]  After hours/weekend pager # available in [facility]

## 2019-11-24 NOTE — Progress Notes (Addendum)
PROGRESS NOTE    Deborah Foster  PZW:[id number] DOB: July 26, 1970 DOA: 11/20/2019 PCP: [name], MD    Chief Complaint  Patient presents with  . Emesis    Brief Narrative:  The patient is a 49 year old [place] speaking female with a past medical history significant for but not limited to metastatic lung cancer metastasis to the brain, metastasis to the adrenal gland, bone metastasis and other comorbidities including but not limited to hemoptysis, history of upper respiratory infection history of radiation to the brain and chest and left hip as well as other comorbidities who sees Dr. Julien Nordmann as an outpatient who presents to the ED with intractable nausea, vomiting and back pain. She started having vomiting with episodic left lower quadrant pain and was unable to drink and eat well because of the worsening nausea vomiting and feels very weak and tired. She reported that her abdominal pain was resolved at the time of her admission but she was having 8 out of 10 pain in her back. She complains of pain in the right rib. Denies any fever, chills, shortness of breath or any other symptoms associated. On arrival to the ED she is found to have a temperature of 97.7 and blood work showed that she had a serum glucose of 113.Is started on normal saline, given IV Zofran and IV morphine is being admitted.   Assessment & Plan:   Principal Problem:   Hyponatremia Active Problems:   Non-small cell carcinoma of lung, stage 4 (HCC)   Nausea and vomiting   Back pain   Dehydration   Sinus tachycardia   Constipation   Hypokalemia   Hypophosphatemia   Hypocalcemia   Metabolic acidosis   Seizure disorder (HCC)  1 hyponatremia/hypochloremia Likely secondary to hypovolemia from nausea vomiting in the setting of abdominal pain.  Sodium level at 113 on admission.  Serum osmolality at 244.  Urine sodium 110.  Urine creatinine of 53.88.  Sodium levels improved with hydration and sodium  currently at 131 today.  Currently on IV fluids.  Saline lock IV fluids this evening.  Repeat labs in the morning.  Follow.  2.  Non-small cell lung cancer stage IV Patient status post chemotherapy.  Patient with metastatic cancer to the brain bones and adrenals.  Denies any abdominal pain.  Complains of back pain.  CT abdomen and pelvis done with some hypoattenuation in the upper poles and medial kidneys bilaterally with some mild bilateral urothelial thickening left slightly greater than right could be secondary to radiation related effects versus UTI, redemonstrated patchy enhancement of the pancreas with slightly lobular, thickened appearance without focal lesion or pancreatic ductal dilatation.  Some surrounding stranding seen in the adjacent retroperitoneum could be radiation related changes versus pancreatitis, slight mucosal hyperemia throughout the first portion of the duodenum nonspecific similar to prior exam, low attenuation ascites similar to only minimally increased in volume from prior with some redistribution including subphrenic spaces, fairly consolidated region of the lung in the medial right base with some features of posttreatment related effects and fibrosis, findings favored to reflect posttreatment related effect though superimposed aspiration may be present.  Slight prominence of left parametrial and gonadal vessels which can be seen with pelvic congestion syndrome in appropriate clinical setting.  Aortic atherosclerosis.  Continue current pain management.  Oncology notified via epic of patient's admission.  3.  Sinus tachycardia Likely in the setting of pain and dehydration.  Improved with hydration.  Saline lock IV fluids this evening.  4.  Nausea vomiting abdominal pain Clinical improvement.  Continue IV antiemetics.  Patient has been advanced to a soft diet.  CT abdomen and pelvis as stated above.  IV antiemetics.  Pain management.  Supportive care.  5.  Back pain Likely  secondary to metastatic cancer.  Continue current pain regimen.  6.  Hypokalemia/hypophosphatemia Repleted.  Potassium at 3.8, phosphorus at 2.9.  Follow.  7.  Constipation Improved on current bowel regimen of MiraLAX twice daily, Dulcolax suppository as needed, Senokot-S twice daily.  Follow.  8.  Hypocalcemia Status post IV calcium gluconate.  Ionized calcium pending.  Corrected calcium improved.  Calcium this morning currently at 8.2 from 7.0.  Placed on oral calcium supplementation.  Check vitamin D level with morning labs.  9.  Metabolic acidosis Resolved with hydration.  Follow.  10.  Normocytic anemia Likely dilutional effect.  Patient with no overt bleeding.  Follow H&H.  Transfusion threshold hemoglobin < 7.  11.  Seizure disorder Continue current regimen of Keppra.  12.  Underweight Nutrition consulted.  13.  Leukocytosis Likely reactive.  Resolved.     DVT prophylaxis: Lovenox Code Status: Full Family Communication: Updated patient.  No family at bedside. Disposition:   Status is: Inpatient    Dispo: The patient is from: Home              Anticipated d/c is to: Home              Anticipated d/c date is: 1 to 2 days.              Patient currently started on soft diet which she is tolerating, still with back pain pain medications being managed.  Not stable for discharge.       Consultants:   Oncology  Procedures:   CT abdomen and pelvis 11/21/2019    Antimicrobials:   None   Subjective: Patient laying in bed.  Denies any emesis.  Some complaints of nausea.  Was able to tolerate oral intake.  Complaining of back pain.  Denies any significant abdominal pain.  Asking whether she can shower.  Having bowel movements per RN on bowel regimen.  Objective: Vitals:   11/23/19 1303 11/23/19 2128 11/24/19 0512 11/24/19 1130  BP: 104/66 110/67 121/69 107/64  Pulse: 99 (!) 107 (!) 110 (!) 106  Resp: 18 15 20 20   Temp: 98 F (36.7 C) 97.8 F (36.6 C)  98.4 F (36.9 C) 98.4 F (36.9 C)  TempSrc: Oral Oral Oral Oral  SpO2: 100% 99% 99% 98%  Weight:      Height:        Intake/Output Summary (Last 24 hours) at 11/24/2019 1926 Last data filed at 11/24/2019 1136 Gross per 24 hour  Intake 357 ml  Output --  Net 357 ml   Filed Weights   11/21/19 1120  Weight: 38.6 kg    Examination:  General exam: NAD Respiratory system: Clear to auscultation. Respiratory effort normal. Cardiovascular system: Tachycardia. No JVD, murmurs, rubs, gallops or clicks. No pedal edema. Gastrointestinal system: Abdomen is nondistended, soft and nontender. No organomegaly or masses felt. Normal bowel sounds heard. Central nervous system: Alert and oriented. No focal neurological deficits. Extremities: Symmetric 5 x 5 power. Skin: No rashes, lesions or ulcers Psychiatry: Judgement and insight appear normal. Mood & affect appropriate.     Data Reviewed: I have personally reviewed following labs and imaging studies  CBC: Recent Labs  Lab 11/20/19 2300 11/21/19 0303 11/22/19 4401 11/23/19 0544 11/24/19 0272  WBC 6.0 7.2 12.3* 8.4 5.8  NEUTROABS  --   --  11.4* 7.5 4.9  HGB 10.8* 10.8* 10.0* 8.7* 10.5*  HCT 30.3* 31.2* 30.0* 25.6* 30.9*  MCV 87.8 87.2 90.9 89.8 90.6  PLT 332 339 324 277 144    Basic Metabolic Panel: Recent Labs  Lab 11/21/19 1229 11/22/19 0626 11/22/19 1351 11/23/19 0544 11/24/19 0640  NA 118* 128* 126* 128* 131*  K 3.9 3.8 3.9 3.3* 3.8  CL 90* 99 100 100 98  CO2 21* 20* 18* 21* 24  GLUCOSE 98 63* 132* 97 105*  BUN 9 6 7  5* <5*  CREATININE 0.38* 0.46 0.50 0.35* 0.45  CALCIUM 6.2* 7.7* 7.1* 7.0* 8.2*  MG  --  2.2  --  2.1 2.3  PHOS  --  1.9*  --  1.9* 2.9    GFR: Estimated Creatinine Clearance: 53 mL/min (by C-G formula based on SCr of 0.45 mg/dL).  Liver Function Tests: Recent Labs  Lab 11/20/19 2300 11/22/19 0626 11/23/19 0544 11/24/19 0640  AST 29 19 14* 22  ALT 34 24 19 24   ALKPHOS 74 66 61 80   BILITOT 0.6 0.9 0.3 0.5  PROT 7.3 6.3* 5.4* 6.8  ALBUMIN 3.6 2.8* 2.4* 3.0*    CBG: No results for input(s): GLUCAP in the last 168 hours.   Recent Results (from the past 240 hour(s))  Respiratory Panel by RT PCR (Flu A&B, Covid) - Nasopharyngeal Swab     Status: None   Collection Time: 11/21/19  2:38 AM   Specimen: Nasopharyngeal Swab  Result Value Ref Range Status   SARS Coronavirus 2 by RT PCR NEGATIVE NEGATIVE Final    Comment: (NOTE) SARS-CoV-2 target nucleic acids are NOT DETECTED.  The SARS-CoV-2 RNA is generally detectable in upper respiratoy specimens during the acute phase of infection. The lowest concentration of SARS-CoV-2 viral copies this assay can detect is 131 copies/mL. A negative result does not preclude SARS-Cov-2 infection and should not be used as the sole basis for treatment or other patient management decisions. A negative result may occur with  improper specimen collection/handling, submission of specimen other than nasopharyngeal swab, presence of viral mutation(s) within the areas targeted by this assay, and inadequate number of viral copies (<131 copies/mL). A negative result must be combined with clinical observations, patient history, and epidemiological information. The expected result is Negative.  Fact Sheet for Patients:  PinkCheek.be  Fact Sheet for Healthcare Providers:  GravelBags.it  This test is no t yet approved or cleared by the [place] FDA and  has been authorized for detection and/or diagnosis of SARS-CoV-2 by FDA under an Emergency Use Authorization (EUA). This EUA will remain  in effect (meaning this test can be used) for the duration of the COVID-19 declaration under Section 564(b)(1) of the Act, 21 U.S.C. section 360bbb-3(b)(1), unless the authorization is terminated or revoked sooner.     Influenza A by PCR NEGATIVE NEGATIVE Final   Influenza B by PCR NEGATIVE  NEGATIVE Final    Comment: (NOTE) The Xpert Xpress SARS-CoV-2/FLU/RSV assay is intended as an aid in  the diagnosis of influenza from Nasopharyngeal swab specimens and  should not be used as a sole basis for treatment. Nasal washings and  aspirates are unacceptable for Xpert Xpress SARS-CoV-2/FLU/RSV  testing.  Fact Sheet for Patients: PinkCheek.be  Fact Sheet for Healthcare Providers: GravelBags.it  This test is not yet approved or cleared by the [place] FDA and  has been authorized for detection and/or  diagnosis of SARS-CoV-2 by  FDA under an Emergency Use Authorization (EUA). This EUA will remain  in effect (meaning this test can be used) for the duration of the  Covid-19 declaration under Section 564(b)(1) of the Act, 21  U.S.C. section 360bbb-3(b)(1), unless the authorization is  terminated or revoked. Performed at [facility], [place] [address] [place], [place] [address]          Radiology Studies: No results found.      Scheduled Meds: . calcium-vitamin D  1 tablet Oral TID  . enoxaparin (LOVENOX) injection  30 mg Subcutaneous Q24H  . feeding supplement  1 Container Oral BID BM  . gabapentin  300 mg Oral BID  . levETIRAcetam  250 mg Oral Q12H  . multivitamin with minerals  1 tablet Oral Daily  . ondansetron (ZOFRAN) IV  4 mg Intravenous Q6H   Or  . ondansetron  4 mg Oral Q6H  . osimertinib mesylate  80 mg Oral Daily  . polyethylene glycol  17 g Oral BID  . senna-docusate  1 tablet Oral BID   Continuous Infusions:    LOS: 3 days    Time spent: 35 minutes    Irine Seal, MD [facility]   To contact the attending provider between 7A-7P or the covering provider during after hours 7P-7A, please log into the web site [email or web address] and access using universal [place] password for that web site. If you do not have the password, please call the hospital  operator.  11/24/2019, 7:26 PM

## 2019-11-24 NOTE — Care Management Important Message (Signed)
Important Message  Patient Details IM Letter given to the Patient Name: Deborah Foster MRN: [id number] Date of Birth: 12-15-1970   Medicare Important Message Given:  Yes     [name], [name] 11/24/2019, 9:55 AM

## 2019-11-24 NOTE — TOC Initial Note (Signed)
Transition of Care [facility]) - Initial/Assessment Note    Patient Details  Name: Deborah Foster MRN: [id number] Date of Birth: [birth year]  Transition of Care [facility]) CM/SW Contact:    Dessa Phi, RN Phone Number: 11/24/2019, 12:55 PM  Clinical Narrative:  From home w/spouse. No current CM needs. Will continue to assess.                Expected Discharge Plan: Home/Self Care Barriers to Discharge: Continued Medical Work up   Patient Goals and CMS Choice Patient states their goals for this hospitalization and ongoing recovery are:: go home CMS Medicare.gov Compare Post Acute Care list provided to:: Patient Choice offered to / list presented to : Patient  Expected Discharge Plan and Services Expected Discharge Plan: Home/Self Care   Discharge Planning Services: CM Consult   Living arrangements for the past 2 months: Single Family Home                                      Prior Living Arrangements/Services Living arrangements for the past 2 months: Single Family Home Lives with:: Spouse Patient language and need for interpreter reviewed:: Yes Do you feel safe going back to the place where you live?: Yes      Need for Family Participation in Patient Care: No (Comment) Care giver support system in place?: Yes (comment)   Criminal Activity/Legal Involvement Pertinent to Current Situation/Hospitalization: No - Comment as needed  Activities of Daily Living Home Assistive Devices/Equipment: None ADL Screening (condition at time of admission) Patient's cognitive ability adequate to safely complete daily activities?: Yes Is the patient deaf or have difficulty hearing?: No Does the patient have difficulty seeing, even when wearing glasses/contacts?: No Does the patient have difficulty concentrating, remembering, or making decisions?: No Patient able to express need for assistance with ADLs?: Yes Does the patient have difficulty dressing or bathing?: Yes Independently performs  ADLs?: Yes (appropriate for developmental age) Does the patient have difficulty walking or climbing stairs?: No Weakness of Legs: Both Weakness of Arms/Hands: None  Permission Sought/Granted Permission sought to share information with : Case Manager Permission granted to share information with : Yes, Verbal Permission Granted  Share Information with NAME: Case manager           Emotional Assessment Appearance:: Appears stated age Attitude/Demeanor/Rapport: Gracious Affect (typically observed): Accepting Orientation: : Oriented to Self, Oriented to Place, Oriented to  Time, Oriented to Situation Alcohol / Substance Use: Not Applicable Psych Involvement: No (comment)  Admission diagnosis:  Dehydration [E86.0] Hyponatremia [E87.1] Non-intractable vomiting without nausea, unspecified vomiting type [R11.11] Patient Active Problem List   Diagnosis Date Noted  . Hyponatremia 11/21/2019  . Nausea and vomiting 11/21/2019  . Back pain 11/21/2019  . Lung metastasis ([place]) 04/30/2019  . Goals of care, counseling/discussion 05/05/2018  . Epigastric pain 05/05/2018  . Blurry vision, bilateral 05/05/2018  . 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 Difficulty sleeping 03/20/2018  . Primary cancer of right lower lobe of lung ([place]) 07/22/2015  . Malnutrition of moderate degree 07/21/2015  . Hemoptysis   . Primary lung adenocarcinoma ([place])   . Odynophagia 07/05/2015  . Folic acid deficiency 59/56/3875  . B12 deficiency 06/06/2015  . Radiation pneumonitis with hemoptysis 06/06/2015  . Bone metastases ([place]) 01/20/2015  . Malignant neoplasm metastatic to left adrenal gland ([place]) 01/20/2015  .  Bronchitis 11/22/2014  . Encounter for antineoplastic chemotherapy 11/02/2014  . Mucositis 10/22/2014  . Rash 10/08/2014  . Non-small cell carcinoma of lung, stage 4 ([place]) 08/26/2014  . Bone disease, metabolic 98/72/1587   . Metastasis to brain ([facility])   . Palliative care encounter   . Adrenal mass, left ([place])   . CAP (community acquired pneumonia) 08/07/2014  . Pneumonia 08/07/2014  . Lung mass 08/07/2014   PCP:  [name], MD Pharmacy:   [facility], [place] [address] Phone: [phone] Fax: [facility], [place] [address] [place] [address] Phone: [phone] Fax: [phone]     Social Determinants of Health (SDOH) Interventions    Readmission Risk Interventions Readmission Risk Prevention Plan 11/24/2019  PCP or Specialist Appt within 3-5 Days Complete  HRI or Home Care Consult Complete  Palliative Care Screening Complete  Medication Review (RN Care Manager) Complete  Some recent data might be hidden

## 2019-11-25 LAB — CBC
HCT: 27.8 % — ABNORMAL LOW (ref 36.0–46.0)
Hemoglobin: 9.3 g/dL — ABNORMAL LOW (ref 12.0–15.0)
MCH: 30.3 pg (ref 26.0–34.0)
MCHC: 33.5 g/dL (ref 30.0–36.0)
MCV: 90.6 fL (ref 80.0–100.0)
Platelets: 290 10*3/uL (ref 150–400)
RBC: 3.07 MIL/uL — ABNORMAL LOW (ref 3.87–5.11)
RDW: 14 % (ref 11.5–15.5)
WBC: 3.8 10*3/uL — ABNORMAL LOW (ref 4.0–10.5)
nRBC: 0 % (ref 0.0–0.2)

## 2019-11-25 LAB — RENAL FUNCTION PANEL
Albumin: 2.4 g/dL — ABNORMAL LOW (ref 3.5–5.0)
Anion gap: 6 (ref 5–15)
BUN: <5 mg/dL — ABNORMAL LOW (ref 6–20)
CO2: 21 mmol/L — ABNORMAL LOW (ref 22–32)
Calcium: 7.5 mg/dL — ABNORMAL LOW (ref 8.9–10.3)
Chloride: 99 mmol/L (ref 98–111)
Creatinine, Ser: 0.35 mg/dL — ABNORMAL LOW (ref 0.44–1.00)
GFR, Estimated: >60 mL/min (ref >60)
Glucose, Bld: 170 mg/dL — ABNORMAL HIGH (ref 70–99)
Phosphorus: 2.9 mg/dL (ref 2.5–4.6)
Potassium: 3.2 mmol/L — ABNORMAL LOW (ref 3.5–5.1)
Sodium: 126 mmol/L — ABNORMAL LOW (ref 135–145)

## 2019-11-25 LAB — BASIC METABOLIC PANEL
Anion gap: 8 (ref 5–15)
BUN: 5 mg/dL — ABNORMAL LOW (ref 6–20)
CO2: 25 mmol/L (ref 22–32)
Calcium: 7.8 mg/dL — ABNORMAL LOW (ref 8.9–10.3)
Chloride: 96 mmol/L — ABNORMAL LOW (ref 98–111)
Creatinine, Ser: 0.43 mg/dL — ABNORMAL LOW (ref 0.44–1.00)
GFR, Estimated: >60 mL/min (ref >60)
Glucose, Bld: 114 mg/dL — ABNORMAL HIGH (ref 70–99)
Potassium: 4.1 mmol/L (ref 3.5–5.1)
Sodium: 129 mmol/L — ABNORMAL LOW (ref 135–145)

## 2019-11-25 LAB — VITAMIN D 25 HYDROXY (VIT D DEFICIENCY, FRACTURES): Vit D, 25-Hydroxy: 37.61 ng/mL (ref 30–100)

## 2019-11-25 LAB — MAGNESIUM: Magnesium: 2.1 mg/dL (ref 1.7–2.4)

## 2019-11-25 MED ORDER — CHLORDIAZEPOXIDE HCL 25 MG PO CAPS: 25.0000 mg | ORAL_CAPSULE | Freq: Four times a day (QID) | ORAL | Status: DC

## 2019-11-25 MED ORDER — ONDANSETRON 4 MG PO TBDP: 4.0000 mg | ORAL_TABLET | Freq: Four times a day (QID) | ORAL | Status: DC | PRN

## 2019-11-25 MED ORDER — DEXAMETHASONE SODIUM PHOSPHATE 10 MG/ML IJ SOLN
8.0000 mg | Freq: Two times a day (BID) | INTRAMUSCULAR | Status: DC
  Administered 2019-11-25 – 2019-11-26 (×2): 8 mg via INTRAVENOUS
  Filled 2019-11-25 (×2): qty 1

## 2019-11-25 MED ORDER — FUROSEMIDE 10 MG/ML IJ SOLN
20.0000 mg | Freq: Once | INTRAMUSCULAR | Status: AC
  Administered 2019-11-25: 20 mg via INTRAVENOUS
  Filled 2019-11-25: qty 2

## 2019-11-25 MED ORDER — CHLORDIAZEPOXIDE HCL 25 MG PO CAPS: 25.0000 mg | ORAL_CAPSULE | Freq: Three times a day (TID) | ORAL | Status: DC

## 2019-11-25 MED ORDER — LORATADINE 10 MG PO TABS
10.0000 mg | ORAL_TABLET | Freq: Every day | ORAL | Status: DC
  Administered 2019-11-25 – 2019-11-27 (×3): 10 mg via ORAL
  Filled 2019-11-25 (×3): qty 1

## 2019-11-25 MED ORDER — HYDROXYZINE HCL 25 MG PO TABS: 25.0000 mg | ORAL_TABLET | Freq: Four times a day (QID) | ORAL | Status: DC | PRN

## 2019-11-25 MED ORDER — CHLORDIAZEPOXIDE HCL 25 MG PO CAPS: 25.0000 mg | ORAL_CAPSULE | Freq: Once | ORAL | Status: DC

## 2019-11-25 MED ORDER — CHLORDIAZEPOXIDE HCL 25 MG PO CAPS: 25.0000 mg | ORAL_CAPSULE | Freq: Every day | ORAL | Status: DC

## 2019-11-25 MED ORDER — CHLORDIAZEPOXIDE HCL 25 MG PO CAPS: 25.0000 mg | ORAL_CAPSULE | ORAL | Status: DC

## 2019-11-25 MED ORDER — PANTOPRAZOLE SODIUM 40 MG PO TBEC
40.0000 mg | DELAYED_RELEASE_TABLET | Freq: Every day | ORAL | Status: DC
  Administered 2019-11-26 – 2019-11-27 (×2): 40 mg via ORAL
  Filled 2019-11-25 (×3): qty 1

## 2019-11-25 MED ORDER — TRAMADOL HCL 50 MG PO TABS: 50.0000 mg | ORAL_TABLET | Freq: Four times a day (QID) | ORAL | Status: DC | PRN

## 2019-11-25 MED ORDER — POTASSIUM CHLORIDE 20 MEQ PO PACK
40.0000 meq | PACK | ORAL | Status: AC
  Administered 2019-11-25 (×2): 40 meq via ORAL
  Filled 2019-11-25 (×2): qty 2

## 2019-11-25 MED ORDER — LOPERAMIDE HCL 2 MG PO CAPS: 2.0000 mg | ORAL_CAPSULE | ORAL | Status: DC | PRN

## 2019-11-25 MED ORDER — CHLORDIAZEPOXIDE HCL 25 MG PO CAPS: 25.0000 mg | ORAL_CAPSULE | Freq: Four times a day (QID) | ORAL | Status: DC | PRN

## 2019-11-25 NOTE — Progress Notes (Signed)
PROGRESS NOTE    Deborah Foster  OEU:[id number] DOB: December 10, 1970 DOA: 11/20/2019 PCP: [name], MD    Chief Complaint  Patient presents with  . Emesis    Brief Narrative:  The patient is a 49 year old [place] speaking female with a past medical history significant for but not limited to metastatic lung cancer metastasis to the brain, metastasis to the adrenal gland, bone metastasis and other comorbidities including but not limited to hemoptysis, history of upper respiratory infection history of radiation to the brain and chest and left hip as well as other comorbidities who sees Dr. Julien Nordmann as an outpatient who presents to the ED with intractable nausea, vomiting and back pain. She started having vomiting with episodic left lower quadrant pain and was unable to drink and eat well because of the worsening nausea vomiting and feels very weak and tired. She reported that her abdominal pain was resolved at the time of her admission but she was having 8 out of 10 pain in her back. She complains of pain in the right rib. Denies any fever, chills, shortness of breath or any other symptoms associated. On arrival to the ED she is found to have a temperature of 97.7 and blood work showed that she had a serum glucose of 113.Is started on normal saline, given IV Zofran and IV morphine is being admitted.   Assessment & Plan:   Principal Problem:   Hyponatremia Active Problems:   Non-small cell carcinoma of lung, stage 4 (HCC)   Nausea and vomiting   Back pain   Dehydration   Sinus tachycardia   Constipation   Hypokalemia   Hypophosphatemia   Hypocalcemia   Metabolic acidosis   Seizure disorder (HCC)  1 hyponatremia/hypochloremia Likely secondary to hypovolemia from nausea vomiting in the setting of abdominal pain versus secondary to SIADH as patient with a history of non-small cell lung cancer stage IV in addition to uncontrolled pain which may be playing a factor..  Sodium  level at 113 on admission.  Serum osmolality at 244.  Urine sodium 110.  Urine creatinine of 53.88.  Sodium levels improved with hydration and sodium was up to 131 (11/24/2019) however trending back down and 126 today.  IV fluids have been saline locked.  Place on 1200 cc/day fluid restriction.  Lasix 20 mg IV x1.  Repeat labs this afternoon.  Pain management.  Follow.   2.  Non-small cell lung cancer stage IV Patient status post chemotherapy.  Patient with metastatic cancer to the brain bones and adrenals.  Denies any abdominal pain.  Complains of back pain.  CT abdomen and pelvis done with some hypoattenuation in the upper poles and medial kidneys bilaterally with some mild bilateral urothelial thickening left slightly greater than right could be secondary to radiation related effects versus UTI, redemonstrated patchy enhancement of the pancreas with slightly lobular, thickened appearance without focal lesion or pancreatic ductal dilatation.  Some surrounding stranding seen in the adjacent retroperitoneum could be radiation related changes versus pancreatitis, slight mucosal hyperemia throughout the first portion of the duodenum nonspecific similar to prior exam, low attenuation ascites similar to only minimally increased in volume from prior with some redistribution including subphrenic spaces, fairly consolidated region of the lung in the medial right base with some features of posttreatment related effects and fibrosis, findings favored to reflect posttreatment related effect though superimposed aspiration may be present.  Slight prominence of left parametrial and gonadal vessels which can be seen with pelvic congestion syndrome in  appropriate clinical setting.  Aortic atherosclerosis.  Patient with significant back pain.  Pain currently not controlled.  Will consult with palliative care for symptom management. Oncology notified via epic of patient's admission.  3.  Sinus tachycardia Likely in the setting  of pain and dehydration.  Improving.  Patient with significant complaints of pain.  Pain likely playing a role with sinus tachycardia.  Follow.   4.  Nausea vomiting abdominal pain Clinical improvement initially.  Patient with complaints of significant abdominal pain today with nausea.  Continue IV antiemetics.  Patient has been advanced to a soft diet.  CT abdomen and pelvis as stated above.  IV antiemetics.  Pain management.  Consulted palliative care for symptom management.  Supportive care.  5.  Back pain Likely secondary to metastatic cancer.  Patient still with complaints of significant pain.  Pain currently not controlled.  Consult palliative care for symptom management.   6.  Hypokalemia/hypophosphatemia Repleted.  Potassium of 4.1.  Phosphorus at 2.9.  Follow.    7.  Constipation Improved on current bowel regimen of MiraLAX twice daily, Dulcolax suppository as needed, Senokot-S twice daily.  Follow.  8.  Hypocalcemia Status post IV calcium gluconate.  Ionized calcium pending.  Corrected calcium improved.  Vitamin D level at 37.61.  Calcium currently at 7.8.  Continue oral calcium supplementation.  Follow.   9.  Metabolic acidosis Resolved with hydration.  Follow.  10.  Normocytic anemia Likely dilutional effect.  Patient with no overt bleeding.  Hemoglobin currently stable at 9.3.  Follow H&H.  Transfusion threshold hemoglobin < 7.  11.  Seizure disorder Continue home regimen Keppra.   12.  Underweight Nutrition consulted.  13.  Leukocytosis Likely reactive.  Resolved.     DVT prophylaxis: Lovenox Code Status: Full Family Communication: Updated patient.  No family at bedside. Disposition:   Status is: Inpatient    Dispo: The patient is from: Home              Anticipated d/c is to: Home              Anticipated d/c date is: 1 to 2 days.              Patient currently with complaints of significant back pain as well as significant abdominal pain.  Complaining of  nausea.  Not feeling well.  Patient not stable for discharge.        Consultants:   Oncology  Palliative care pending  Procedures:   CT abdomen and pelvis 11/21/2019    Antimicrobials:   None   Subjective: Patient sitting up in bed with complaints of abdominal pain and back pain.  Patient with some complaints of nausea.  States does not feel well today.  Denies any chest pain.  No shortness of breath.   Objective: Vitals:   11/24/19 2035 11/25/19 0626 11/25/19 0642 11/25/19 1002  BP: (!) 98/54 115/72 126/77   Pulse: 100 (!) 111 (!) 104   Resp: 16 16 16    Temp: 98 F (36.7 C) 98.2 F (36.8 C) 98.3 F (36.8 C)   TempSrc: Oral Oral Oral   SpO2: 99% 100%    Weight:    41 kg  Height:        Intake/Output Summary (Last 24 hours) at 11/25/2019 1210 Last data filed at 11/25/2019 1058 Gross per 24 hour  Intake --  Output 200 ml  Net -200 ml   Filed Weights   11/21/19 1120 11/25/19 1002  Weight:  38.6 kg 41 kg    Examination:  General exam: NAD Respiratory system: CTAB.  No wheezes, no crackles, no rhonchi.  Normal respiratory effort.  Cardiovascular system: Tachycardia.  No JVD, no murmurs rubs or gallops.  No lower extremity edema.  Gastrointestinal system: Abdomen is soft, nondistended, some diffuse tenderness to palpation, positive bowel sounds.  No rebound.  No guarding.  Central nervous system: Alert and oriented. No focal neurological deficits. Extremities: Symmetric 5 x 5 power. Skin: No rashes, lesions or ulcers Psychiatry: Judgement and insight appear normal. Mood & affect appropriate.     Data Reviewed: I have personally reviewed following labs and imaging studies  CBC: Recent Labs  Lab 11/21/19 0303 11/22/19 0626 11/23/19 0544 11/24/19 0640 11/25/19 0536  WBC 7.2 12.3* 8.4 5.8 3.8*  NEUTROABS  --  11.4* 7.5 4.9  --   HGB 10.8* 10.0* 8.7* 10.5* 9.3*  HCT 31.2* 30.0* 25.6* 30.9* 27.8*  MCV 87.2 90.9 89.8 90.6 90.6  PLT 339 324 277 327  222    Basic Metabolic Panel: Recent Labs  Lab 11/22/19 0626 11/22/19 1351 11/23/19 0544 11/24/19 0640 11/25/19 0536  NA 128* 126* 128* 131* 126*  K 3.8 3.9 3.3* 3.8 3.2*  CL 99 100 100 98 99  CO2 20* 18* 21* 24 21*  GLUCOSE 63* 132* 97 105* 170*  BUN 6 7 5* <5* <5*  CREATININE 0.46 0.50 0.35* 0.45 0.35*  CALCIUM 7.7* 7.1* 7.0* 8.2* 7.5*  MG 2.2  --  2.1 2.3 2.1  PHOS 1.9*  --  1.9* 2.9 2.9    GFR: Estimated Creatinine Clearance: 56.3 mL/min (A) (by C-G formula based on SCr of 0.35 mg/dL (L)).  Liver Function Tests: Recent Labs  Lab 11/20/19 2300 11/22/19 0626 11/23/19 0544 11/24/19 0640 11/25/19 0536  AST 29 19 14* 22  --   ALT 34 24 19 24   --   ALKPHOS 74 66 61 80  --   BILITOT 0.6 0.9 0.3 0.5  --   PROT 7.3 6.3* 5.4* 6.8  --   ALBUMIN 3.6 2.8* 2.4* 3.0* 2.4*    CBG: No results for input(s): GLUCAP in the last 168 hours.   Recent Results (from the past 240 hour(s))  Respiratory Panel by RT PCR (Flu A&B, Covid) - Nasopharyngeal Swab     Status: None   Collection Time: 11/21/19  2:38 AM   Specimen: Nasopharyngeal Swab  Result Value Ref Range Status   SARS Coronavirus 2 by RT PCR NEGATIVE NEGATIVE Final    Comment: (NOTE) SARS-CoV-2 target nucleic acids are NOT DETECTED.  The SARS-CoV-2 RNA is generally detectable in upper respiratoy specimens during the acute phase of infection. The lowest concentration of SARS-CoV-2 viral copies this assay can detect is 131 copies/mL. A negative result does not preclude SARS-Cov-2 infection and should not be used as the sole basis for treatment or other patient management decisions. A negative result may occur with  improper specimen collection/handling, submission of specimen other than nasopharyngeal swab, presence of viral mutation(s) within the areas targeted by this assay, and inadequate number of viral copies (<131 copies/mL). A negative result must be combined with clinical observations, patient history, and  epidemiological information. The expected result is Negative.  Fact Sheet for Patients:  PinkCheek.be  Fact Sheet for Healthcare Providers:  GravelBags.it  This test is no t yet approved or cleared by the [place] FDA and  has been authorized for detection and/or diagnosis of SARS-CoV-2 by FDA under an Emergency Use  Authorization (EUA). This EUA will remain  in effect (meaning this test can be used) for the duration of the COVID-19 declaration under Section 564(b)(1) of the Act, 21 U.S.C. section 360bbb-3(b)(1), unless the authorization is terminated or revoked sooner.     Influenza A by PCR NEGATIVE NEGATIVE Final   Influenza B by PCR NEGATIVE NEGATIVE Final    Comment: (NOTE) The Xpert Xpress SARS-CoV-2/FLU/RSV assay is intended as an aid in  the diagnosis of influenza from Nasopharyngeal swab specimens and  should not be used as a sole basis for treatment. Nasal washings and  aspirates are unacceptable for Xpert Xpress SARS-CoV-2/FLU/RSV  testing.  Fact Sheet for Patients: PinkCheek.be  Fact Sheet for Healthcare Providers: GravelBags.it  This test is not yet approved or cleared by the [place] FDA and  has been authorized for detection and/or diagnosis of SARS-CoV-2 by  FDA under an Emergency Use Authorization (EUA). This EUA will remain  in effect (meaning this test can be used) for the duration of the  Covid-19 declaration under Section 564(b)(1) of the Act, 21  U.S.C. section 360bbb-3(b)(1), unless the authorization is  terminated or revoked. Performed at [facility], [place] [address] [place], [place] [address]          Radiology Studies: No results found.      Scheduled Meds: . calcium-vitamin D  1 tablet Oral TID  . enoxaparin (LOVENOX) injection  30 mg Subcutaneous Q24H  . feeding supplement  1 Container Oral  BID BM  . gabapentin  300 mg Oral BID  . levETIRAcetam  250 mg Oral Q12H  . loratadine  10 mg Oral Daily  . multivitamin with minerals  1 tablet Oral Daily  . ondansetron (ZOFRAN) IV  4 mg Intravenous Q6H   Or  . ondansetron  4 mg Oral Q6H  . osimertinib mesylate  80 mg Oral Daily  . pantoprazole  40 mg Oral Daily  . polyethylene glycol  17 g Oral BID  . potassium chloride  40 mEq Oral Q4H  . senna-docusate  1 tablet Oral BID   Continuous Infusions:    LOS: 4 days    Time spent: 35 minutes    Irine Seal, MD [facility]   To contact the attending provider between 7A-7P or the covering provider during after hours 7P-7A, please log into the web site [email or web address] and access using universal [place] password for that web site. If you do not have the password, please call the hospital operator.  11/25/2019, 12:10 PM

## 2019-11-26 LAB — CBC WITH DIFFERENTIAL/PLATELET
Abs Immature Granulocytes: 0.01 10*3/uL (ref 0.00–0.07)
Basophils Absolute: 0 10*3/uL (ref 0.0–0.1)
Basophils Relative: 0 %
Eosinophils Absolute: 0 10*3/uL (ref 0.0–0.5)
Eosinophils Relative: 0 %
HCT: 28.9 % — ABNORMAL LOW (ref 36.0–46.0)
Hemoglobin: 9.6 g/dL — ABNORMAL LOW (ref 12.0–15.0)
Immature Granulocytes: 0 %
Lymphocytes Relative: 8 %
Lymphs Abs: 0.2 10*3/uL — ABNORMAL LOW (ref 0.7–4.0)
MCH: 29.7 pg (ref 26.0–34.0)
MCHC: 33.2 g/dL (ref 30.0–36.0)
MCV: 89.5 fL (ref 80.0–100.0)
Monocytes Absolute: 0 10*3/uL — ABNORMAL LOW (ref 0.1–1.0)
Monocytes Relative: 1 %
Neutro Abs: 2.2 10*3/uL (ref 1.7–7.7)
Neutrophils Relative %: 91 %
Platelets: 315 10*3/uL (ref 150–400)
RBC: 3.23 MIL/uL — ABNORMAL LOW (ref 3.87–5.11)
RDW: 14 % (ref 11.5–15.5)
WBC: 2.4 10*3/uL — ABNORMAL LOW (ref 4.0–10.5)
nRBC: 0 % (ref 0.0–0.2)

## 2019-11-26 LAB — BASIC METABOLIC PANEL
Anion gap: 7 (ref 5–15)
Anion gap: 9 (ref 5–15)
BUN: 5 mg/dL — ABNORMAL LOW (ref 6–20)
BUN: 9 mg/dL (ref 6–20)
CO2: 23 mmol/L (ref 22–32)
CO2: 25 mmol/L (ref 22–32)
Calcium: 8.2 mg/dL — ABNORMAL LOW (ref 8.9–10.3)
Calcium: 8.8 mg/dL — ABNORMAL LOW (ref 8.9–10.3)
Chloride: 96 mmol/L — ABNORMAL LOW (ref 98–111)
Chloride: 99 mmol/L (ref 98–111)
Creatinine, Ser: 0.48 mg/dL (ref 0.44–1.00)
Creatinine, Ser: 0.5 mg/dL (ref 0.44–1.00)
GFR, Estimated: >60 mL/min (ref >60)
GFR, Estimated: >60 mL/min (ref >60)
Glucose, Bld: 206 mg/dL — ABNORMAL HIGH (ref 70–99)
Glucose, Bld: 143 mg/dL — ABNORMAL HIGH (ref 70–99)
Potassium: 4.7 mmol/L (ref 3.5–5.1)
Potassium: 4 mmol/L (ref 3.5–5.1)
Sodium: 126 mmol/L — ABNORMAL LOW (ref 135–145)
Sodium: 133 mmol/L — ABNORMAL LOW (ref 135–145)

## 2019-11-26 LAB — GLUCOSE, CAPILLARY
Glucose-Capillary: 153 mg/dL — ABNORMAL HIGH (ref 70–99)
Glucose-Capillary: 124 mg/dL — ABNORMAL HIGH (ref 70–99)
Glucose-Capillary: 134 mg/dL — ABNORMAL HIGH (ref 70–99)

## 2019-11-26 LAB — MAGNESIUM: Magnesium: 2.2 mg/dL (ref 1.7–2.4)

## 2019-11-26 LAB — OSMOLALITY, URINE: Osmolality, Ur: 269 mOsm/kg — ABNORMAL LOW (ref 300–900)

## 2019-11-26 LAB — TSH: TSH: 0.467 u[IU]/mL (ref 0.350–4.500)

## 2019-11-26 LAB — OSMOLALITY: Osmolality: 270 mOsm/kg — ABNORMAL LOW (ref 275–295)

## 2019-11-26 LAB — SODIUM, URINE, RANDOM: Sodium, Ur: 92 mmol/L

## 2019-11-26 LAB — CREATININE, URINE, RANDOM: Creatinine, Urine: 15.18 mg/dL

## 2019-11-26 MED ORDER — TRAMADOL HCL 50 MG PO TABS: 50.0000 mg | ORAL_TABLET | Freq: Four times a day (QID) | ORAL | Status: DC | PRN

## 2019-11-26 MED ORDER — SODIUM CHLORIDE 0.9 % IV SOLN
8.0000 mg | Freq: Four times a day (QID) | INTRAVENOUS | Status: DC | PRN
  Filled 2019-11-26: qty 4

## 2019-11-26 MED ORDER — FUROSEMIDE 10 MG/ML IJ SOLN
20.0000 mg | Freq: Once | INTRAMUSCULAR | Status: AC
  Administered 2019-11-26: 20 mg via INTRAVENOUS
  Filled 2019-11-26 (×2): qty 2

## 2019-11-26 MED ORDER — INSULIN ASPART 100 UNIT/ML ~~LOC~~ SOLN
0.0000 [IU] | Freq: Three times a day (TID) | SUBCUTANEOUS | Status: DC
  Administered 2019-11-26: 2 [IU] via SUBCUTANEOUS
  Administered 2019-11-27 (×2): 1 [IU] via SUBCUTANEOUS

## 2019-11-26 MED ORDER — DICLOFENAC SODIUM 1 % EX GEL
2.0000 g | Freq: Four times a day (QID) | CUTANEOUS | Status: DC
  Administered 2019-11-26 – 2019-11-27 (×6): 2 g via TOPICAL
  Filled 2019-11-26: qty 100

## 2019-11-26 MED ORDER — DEXAMETHASONE 4 MG PO TABS
4.0000 mg | ORAL_TABLET | Freq: Two times a day (BID) | ORAL | Status: DC
  Administered 2019-11-26 – 2019-11-27 (×2): 4 mg via ORAL
  Filled 2019-11-26 (×2): qty 1

## 2019-11-26 NOTE — Progress Notes (Signed)
PROGRESS NOTE    Deborah Foster  [name]:[id number] DOB: 1970/05/24 DOA: 11/20/2019 PCP: Deborah Downing, MD    Chief Complaint  Patient presents with  . Emesis    Brief Narrative:  The patient is a 49 year old [place] speaking female with a past medical history significant for but not limited to metastatic lung cancer metastasis to the brain, metastasis to the adrenal gland, bone metastasis and other comorbidities including but not limited to hemoptysis, history of upper respiratory infection history of radiation to the brain and chest and left hip as well as other comorbidities who sees Dr. Julien Nordmann as an outpatient who presents to the ED with intractable nausea, vomiting and back pain. She started having vomiting with episodic left lower quadrant pain and was unable to drink and eat well because of the worsening nausea vomiting and feels very weak and tired. She reported that her abdominal pain was resolved at the time of her admission but she was having 8 out of 10 pain in her back. She complains of pain in the right rib. Denies any fever, chills, shortness of breath or any other symptoms associated. On arrival to the ED she is found to have a temperature of 97.7 and blood work showed that she had a serum glucose of 113.Is started on normal saline, given IV Zofran and IV morphine is being admitted.   Assessment & Plan:   Principal Problem:   Hyponatremia Active Problems:   Non-small cell carcinoma of lung, stage 4 (HCC)   Nausea and vomiting   Back pain   Dehydration   Sinus tachycardia   Constipation   Hypokalemia   Hypophosphatemia   Hypocalcemia   Metabolic acidosis   Seizure disorder (HCC)  1 hyponatremia/hypochloremia Likely multifactorial secondary to hypovolemia from nausea vomiting in the setting of abdominal pain versus secondary to SIADH as patient with a history of non-small cell lung cancer stage IV in addition to uncontrolled pain which may be playing a  factor versus adrenal insufficiency as patient with metastases to adrenal glands...  Sodium level at 113 on admission.  Serum osmolality at 244.  Urine sodium 110.  Urine creatinine of 53.88.  Sodium levels improved with hydration and sodium was up to 131 (11/24/2019) however fluctuating and down to 126 today.  Patient was fluid restricted on 11/25/2019 given a dose of Lasix 20 mg IV x1 with some improvement to 129 but dropped back down to 126 today.  Check urine lytes, check serum and urine osmolality, check a TSH.  Patient started on Decadron and as such unable to check a random cortisol level.  Give Lasix 20 mg IV x1.  Nephrology consulted for further evaluation and management.  Repeat labs in the morning.    2.  Non-small cell lung cancer stage IV Patient status post chemotherapy.  Patient with metastatic cancer to the brain bones and adrenals.  Denies any abdominal pain.  Complains of back pain.  CT abdomen and pelvis done with some hypoattenuation in the upper poles and medial kidneys bilaterally with some mild bilateral urothelial thickening left slightly greater than right could be secondary to radiation related effects versus UTI, redemonstrated patchy enhancement of the pancreas with slightly lobular, thickened appearance without focal lesion or pancreatic ductal dilatation.  Some surrounding stranding seen in the adjacent retroperitoneum could be radiation related changes versus pancreatitis, slight mucosal hyperemia throughout the first portion of the duodenum nonspecific similar to prior exam, low attenuation ascites similar to only minimally increased in volume from  prior with some redistribution including subphrenic spaces, fairly consolidated region of the lung in the medial right base with some features of posttreatment related effects and fibrosis, findings favored to reflect posttreatment related effect though superimposed aspiration may be present.  Slight prominence of left parametrial and  gonadal vessels which can be seen with pelvic congestion syndrome in appropriate clinical setting.  Aortic atherosclerosis.  Patient with significant back pain (11/25/2019).  Palliative care consulted for symptomatic management of pain.  Patient started on IV Decadron with improvement with pain.  Currently on IV Dilaudid, Vicodin.  Tramadol discontinued by palliative care due to decreasing seizure threshold and worsening nausea.  Appreciate palliative care input and recommendations.  Oncology notified via epic of patient's admission.  3.  Sinus tachycardia Likely in the setting of pain and dehydration.  Improving with pain management. Follow.   4.  Nausea vomiting abdominal pain Clinical improvement initially.  Patient with complaints of significant abdominal pain with nausea yesterday (11/25/2019).  Continue IV antiemetics.  Patient started on IV Decadron per palliative care.  Patient has been advanced to a soft diet.  CT abdomen and pelvis as stated above.  IV antiemetics.  Pain management.  Supportive care.  5.  Back pain Likely secondary to metastatic cancer.  Patient had some complaints of significant pain yesterday and pain was not controlled.  Palliative care was consulted for pain management.  IV Decadron started with clinical improvement.  Continue current regimen of IV Dilaudid, as needed Vicodin.  Tramadol discontinued per palliative care recommendations as could worsen nausea and lower seizure threshold.  Tramadol will be discontinued on discharge.  Pain management per palliative care.  Appreciate palliative care input and recommendations.    6.  Hypokalemia/hypophosphatemia Potassium of 4.0.  Phosphorus repleted.  Follow.    7.  Constipation Improved on current bowel regimen of MiraLAX twice daily, Dulcolax suppository as needed, Senokot-S twice daily.  Follow.  8.  Hypocalcemia Status post IV calcium gluconate.  Corrected calcium improved.  Vitamin D level at 37.61.  Calcium currently  at 8.8.  Continue oral calcium supplementation.  Follow.   9.  Metabolic acidosis Resolved with hydration.  Follow.  10.  Normocytic anemia Likely dilutional effect.  Patient with no overt bleeding.  Hemoglobin currently stable at 9.6.  Transfusion threshold hemoglobin < 7.  Follow H&H.  11.  Seizure disorder Keppra.    12.  Underweight Nutrition consulted.  13.  Leukocytosis Likely reactive.  Resolved.     DVT prophylaxis: Lovenox Code Status: Full Family Communication: Updated patient.  No family at bedside. Disposition:   Status is: Inpatient    Dispo: The patient is from: Home              Anticipated d/c is to: Home              Anticipated d/c date is: 1 to 2 days.               Patient currently on IV pain medication, started on IV Decadron for better symptomatic management.  Not stable for discharge.        Consultants:   Oncology Palliative care: Dr. Hilma Favors 11/25/2019 Nephrology: Dr. Johnney Ou 11/26/2019  Procedures:   CT abdomen and pelvis 11/21/2019    Antimicrobials:   None   Subjective: Patient ambulating in room.  States back pain and abdominal pain, nausea improved after being started on Decadron.  Pain currently controlled.  Feeling better than she did.   Objective: Vitals:  11/25/19 1529 11/25/19 2100 11/26/19 0012 11/26/19 0556  BP: 111/68 (!) 110/53 109/78 113/74  Pulse: (!) 110 (!) 115 (!) 113 (!) 112  Resp: 14 18 16 18   Temp: 98.2 F (36.8 C) 98.3 F (36.8 C) 97.8 F (36.6 C) 97.6 F (36.4 C)  TempSrc: Oral Oral Oral Oral  SpO2:  98% 97% 99%  Weight:      Height:        Intake/Output Summary (Last 24 hours) at 11/26/2019 1222 Last data filed at 11/26/2019 0600 Gross per 24 hour  Intake 360 ml  Output 1100 ml  Net -740 ml   Filed Weights   11/21/19 1120 11/25/19 1002  Weight: 38.6 kg 41 kg    Examination:  General exam: NAD Respiratory system: Lungs clear to auscultation bilaterally.  No wheezes, no crackles,  no rhonchi.  Normal respiratory effort.  Cardiovascular system: Tachycardia.  No murmurs rubs or gallops.  No JVD.  No lower extremity edema.  Gastrointestinal system: Abdomen is soft, decreased diffuse tenderness to palpation, nondistended, positive bowel sounds.  No rebound.  No guarding.  Central nervous system: Alert and oriented. No focal neurological deficits. Extremities: Symmetric 5 x 5 power. Skin: No rashes, lesions or ulcers Psychiatry: Judgement and insight appear normal. Mood & affect appropriate.     Data Reviewed: I have personally reviewed following labs and imaging studies  CBC: Recent Labs  Lab 11/22/19 0626 11/23/19 0544 11/24/19 0640 11/25/19 0536 11/26/19 0518  WBC 12.3* 8.4 5.8 3.8* 2.4*  NEUTROABS 11.4* 7.5 4.9  --  2.2  HGB 10.0* 8.7* 10.5* 9.3* 9.6*  HCT 30.0* 25.6* 30.9* 27.8* 28.9*  MCV 90.9 89.8 90.6 90.6 89.5  PLT 324 277 327 290 638    Basic Metabolic Panel: Recent Labs  Lab 11/22/19 0626 11/22/19 1351 11/23/19 0544 11/24/19 0640 11/25/19 0536 11/25/19 1310 11/26/19 0518  NA 128*   < > 128* 131* 126* 129* 126*  K 3.8   < > 3.3* 3.8 3.2* 4.1 4.7  CL 99   < > 100 98 99 96* 96*  CO2 20*   < > 21* 24 21* 25 23  GLUCOSE 63*   < > 97 105* 170* 114* 206*  BUN 6   < > 5* <5* <5* 5* 5*  CREATININE 0.46   < > 0.35* 0.45 0.35* 0.43* 0.48  CALCIUM 7.7*   < > 7.0* 8.2* 7.5* 7.8* 8.2*  MG 2.2  --  2.1 2.3 2.1  --  2.2  PHOS 1.9*  --  1.9* 2.9 2.9  --   --    < > = values in this interval not displayed.    GFR: Estimated Creatinine Clearance: 56.3 mL/min (by C-G formula based on SCr of 0.48 mg/dL).  Liver Function Tests: Recent Labs  Lab 11/20/19 2300 11/22/19 0626 11/23/19 0544 11/24/19 0640 11/25/19 0536  AST 29 19 14* 22  --   ALT 34 24 19 24   --   ALKPHOS 74 66 61 80  --   BILITOT 0.6 0.9 0.3 0.5  --   PROT 7.3 6.3* 5.4* 6.8  --   ALBUMIN 3.6 2.8* 2.4* 3.0* 2.4*    CBG: Recent Labs  Lab 11/26/19 1138  GLUCAP 153*      Recent Results (from the past 240 hour(s))  Respiratory Panel by RT PCR (Flu A&B, Covid) - Nasopharyngeal Swab     Status: None   Collection Time: 11/21/19  2:38 AM   Specimen: Nasopharyngeal Swab  Result Value Ref Range Status   SARS Coronavirus 2 by RT PCR NEGATIVE NEGATIVE Final    Comment: (NOTE) SARS-CoV-2 target nucleic acids are NOT DETECTED.  The SARS-CoV-2 RNA is generally detectable in upper respiratoy specimens during the acute phase of infection. The lowest concentration of SARS-CoV-2 viral copies this assay can detect is 131 copies/mL. A negative result does not preclude SARS-Cov-2 infection and should not be used as the sole basis for treatment or other patient management decisions. A negative result may occur with  improper specimen collection/handling, submission of specimen other than nasopharyngeal swab, presence of viral mutation(s) within the areas targeted by this assay, and inadequate number of viral copies (<131 copies/mL). A negative result must be combined with clinical observations, patient history, and epidemiological information. The expected result is Negative.  Fact Sheet for Patients:  PinkCheek.be  Fact Sheet for Healthcare Providers:  GravelBags.it  This test is no t yet approved or cleared by the [place] FDA and  has been authorized for detection and/or diagnosis of SARS-CoV-2 by FDA under an Emergency Use Authorization (EUA). This EUA will remain  in effect (meaning this test can be used) for the duration of the COVID-19 declaration under Section 564(b)(1) of the Act, 21 U.S.C. section 360bbb-3(b)(1), unless the authorization is terminated or revoked sooner.     Influenza A by PCR NEGATIVE NEGATIVE Final   Influenza B by PCR NEGATIVE NEGATIVE Final    Comment: (NOTE) The Xpert Xpress SARS-CoV-2/FLU/RSV assay is intended as an aid in  the diagnosis of influenza from  Nasopharyngeal swab specimens and  should not be used as a sole basis for treatment. Nasal washings and  aspirates are unacceptable for Xpert Xpress SARS-CoV-2/FLU/RSV  testing.  Fact Sheet for Patients: PinkCheek.be  Fact Sheet for Healthcare Providers: GravelBags.it  This test is not yet approved or cleared by the [place] FDA and  has been authorized for detection and/or diagnosis of SARS-CoV-2 by  FDA under an Emergency Use Authorization (EUA). This EUA will remain  in effect (meaning this test can be used) for the duration of the  Covid-19 declaration under Section 564(b)(1) of the Act, 21  U.S.C. section 360bbb-3(b)(1), unless the authorization is  terminated or revoked. Performed at [facility], [place] [address], [place], [place] [address]          Radiology Studies: No results found.      Scheduled Meds: . calcium-vitamin D  1 tablet Oral TID  . dexamethasone (DECADRON) injection  8 mg Intravenous Q12H  . enoxaparin (LOVENOX) injection  30 mg Subcutaneous Q24H  . feeding supplement  1 Container Oral BID BM  . gabapentin  300 mg Oral BID  . insulin aspart  0-9 Units Subcutaneous TID WC  . levETIRAcetam  250 mg Oral Q12H  . loratadine  10 mg Oral Daily  . osimertinib mesylate  80 mg Oral Daily  . pantoprazole  40 mg Oral Daily  . polyethylene glycol  17 g Oral BID  . senna-docusate  1 tablet Oral BID   Continuous Infusions: . ondansetron (ZOFRAN) IV       LOS: 5 days    Time spent: 35 minutes    Irine Seal, MD [facility]   To contact the attending provider between 7A-7P or the covering provider during after hours 7P-7A, please log into the web site [email or web address] and access using universal [place] password for that web site. If you do not have the password, please call the  hospital operator.  11/26/2019, 12:22 PM

## 2019-11-26 NOTE — Progress Notes (Signed)
   11/25/19 2100  Assess: MEWS Score  Temp 98.3 F (36.8 C)  BP (!) 110/53  Pulse Rate (!) 115  Resp 18  SpO2 98 %  O2 Device Room Air  Assess: MEWS Score  MEWS Temp 0  MEWS Systolic 0  MEWS Pulse 2  MEWS RR 0  MEWS LOC 0  MEWS Score 2  MEWS Score Color Yellow  Assess: if the MEWS score is Yellow or Red  Were vital signs taken at a resting state? Yes  Focused Assessment No change from prior assessment  Early Detection of Sepsis Score *See Row Information* Low  MEWS guidelines implemented *See Row Information* No, previously yellow, continue vital signs every 4 hours  Treat  Pain Intervention(s) Medication (See eMAR)

## 2019-11-26 NOTE — Consult Note (Signed)
[facility]  INPATIENT CONSULTATION  Reason for Consultation: hyponatremia Requesting Provider: Dr. Grandville Silos  HPI: Deborah Foster is an 49 y.o. female with metastatic NSCLC on Tagrisso admitted with intractable N/V, back pain who is seen for evaluation and management of hyponatremia.   Presented 11/5 with sodium of 113 (was 126 on 10/23 outpt) in setting of n/v and she was hydrated with improvement to 128 by 11/7.  Further improved to 131 11/9 but then trended down to 126 - 129 on 11/10 - 126 on 11/11.   Urine sodium 110 on presentation.  Urine osm have not been checked during the admission.  Wt 11/6 38.6kg > 11/10 41kg.  I/Os showing +3.7L for admission, yesterday I/Os 360/1300mL.   Today she is on 1279mL fluid restriction and rec'd lasix 20 IV.  Palliative medicine consulted yesterday and rec dexamethasone po taper for symptom management.    She has some chronic intermittent dizziness.  Doesn't feel confused or thirsty.  No swelling.  No LUTs, UOP normal.   PMH: Past Medical History:  Diagnosis Date  . Bone metastasis ([place])   . Hemoptysis   . Hypokalemia   . lung ca dx'd 07/2014  . Lung mass   . Metastasis to adrenal gland ([place])   . Metastasis to brain ([place])   . Pneumonia   . Radiation 08/23/14-09/07/14   Brain/chest and left hip 30 Gy 12 Fx  . URI (upper respiratory infection) 02-02-2015   PSH: Past Surgical History:  Procedure Laterality Date  . IR GENERIC HISTORICAL  07/14/2015   IR RADIOLOGIST EVAL & MGMT 07/14/2015 Greggory Keen, MD [facility] INTERV RAD  . VIDEO BRONCHOSCOPY Bilateral 08/09/2014   Procedure: VIDEO BRONCHOSCOPY WITHOUT FLUORO;  Surgeon: [name], MD;  Location: [facility] ENDOSCOPY;  Service: Cardiopulmonary;  Laterality: Bilateral;  . VIDEO BRONCHOSCOPY Bilateral 07/06/2015   Procedure: VIDEO BRONCHOSCOPY WITHOUT FLUORO;  Surgeon: [name], MD;  Location: [facility] ENDOSCOPY;  Service: Cardiopulmonary;  Laterality: Bilateral;     Past Medical  History:  Diagnosis Date  . Bone metastasis ([place])   . Hemoptysis   . Hypokalemia   . lung ca dx'd 07/2014  . Lung mass   . Metastasis to adrenal gland ([place])   . Metastasis to brain ([place])   . Pneumonia   . Radiation 08/23/14-09/07/14   Brain/chest and left hip 30 Gy 12 Fx  . URI (upper respiratory infection) 02-02-2015    Medications:  I have reviewed the patient's current medications.  Medications Prior to Admission  Medication Sig Dispense Refill  . Cyanocobalamin (VITAMIN B-12) 2500 MCG SUBL Place 2,500 mcg under the tongue daily.    Marland Kitchen levETIRAcetam (KEPPRA) 250 MG tablet Take 1 tablet (250 mg total) by mouth 2 (two) times daily. 60 tablet 5  . loratadine (CLARITIN) 10 MG tablet Take 1 tablet (10 mg total) by mouth daily. 30 tablet 0  . ondansetron (ZOFRAN) 8 MG tablet Take 8 mg by mouth every 8 (eight) hours as needed for nausea or vomiting.     . pantoprazole (PROTONIX) 40 MG tablet Take 40 mg by mouth daily.    Marland Kitchen TAGRISSO 80 MG tablet TAKE 1 TABLET (80 MG TOTAL) BY MOUTH DAILY. (Patient taking differently: Take 80 mg by mouth daily. ) 30 tablet 11  . HYDROcodone-acetaminophen (NORCO/VICODIN) 5-325 MG tablet Take 1 tablet by mouth 3 (three) times daily as needed. (Patient not taking: Reported on 11/21/2019) 30 tablet 0  . prochlorperazine (COMPAZINE) 10 MG tablet TAKE 1 TABLET BY  MOUTH EVERY 6 HOURS AS NEEDED FOR NAUSEA OR VOMITING. (Patient not taking: Reported on 11/21/2019) 30 tablet 0  . rivaroxaban (XARELTO) 20 MG TABS tablet TAKE 1 TABLET (20 MG TOTAL) BY MOUTH DAILY WITH SUPPER. (Patient not taking: Reported on 11/07/2019) 30 tablet 1  . XALKORI 250 MG capsule TAKE 1 CAPSULE BY MOUTH 2 TIMES DAILY. (Patient not taking: Reported on 11/07/2019) 60 capsule 2    ALLERGIES:   Allergies  Allergen Reactions  . Other Other (See Comments)    Patient states that there is some she takes that makes her throat "cry" AND SCRATCHY   . Tramadol Nausea And Vomiting    Lowers seizure  threshold  . Doxycycline Nausea And Vomiting    FAM HX: Family History  Problem Relation Age of Onset  . Hyperlipidemia Mother     Social History:   reports that she has never smoked. She has never used smokeless tobacco. She reports that she does not drink alcohol and does not use drugs.  ROS: 12 system ROS per HPI above  Blood pressure 113/74, pulse (!) 112, temperature 97.6 F (36.4 C), temperature source Oral, resp. rate 18, height 4\' 11"  (1.499 m), weight 41 kg, SpO2 99 %. PHYSICAL EXAM: Gen: cachectic, ambulating around room easily  Eyes: anicteric ENT: MMM Neck: supple CV:  Tachycardic, regular Abd: soft, thin Back:clear lungs GU: no foley Extr:  No edema Neuro: nonfocal Skin: warm and dry   Results for orders placed or performed during the hospital encounter of 11/20/19 (from the past 48 hour(s))  VITAMIN D 25 Hydroxy (Vit-D Deficiency, Fractures)     Status: None   Collection Time: 11/25/19  5:36 AM  Result Value Ref Range   Vit D, 25-Hydroxy 37.61 30 - 100 ng/mL    Comment: (NOTE) Vitamin D deficiency has been defined by the Institute of Medicine  and an Endocrine Society practice guideline as a level of serum 25-OH  vitamin D less than 20 ng/mL (1,2). The Endocrine Society went on to  further define vitamin D insufficiency as a level between 21 and 29  ng/mL (2).  1. IOM (Institute of Medicine). 2010. Dietary reference intakes for  calcium and D. [place]: The [employer]. 2. Holick MF, Binkley Geraldine, Bischoff-Ferrari HA, et al. Evaluation,  treatment, and prevention of vitamin D deficiency: an Endocrine  Society clinical practice guideline, JCEM. 2011 Jul; 96(7): 1911-30.  Performed at [facility], [place] [address] [place], [place] [address]   CBC     Status: Abnormal   Collection Time: 11/25/19  5:36 AM  Result Value Ref Range   WBC 3.8 (L) 4.0 - 10.5 K/uL   RBC 3.07 (L) 3.87 - 5.11 MIL/uL   Hemoglobin 9.3 (L) 12.0 - 15.0 g/dL    HCT 27.8 (L) 36 - 46 %   MCV 90.6 80.0 - 100.0 fL   MCH 30.3 26.0 - 34.0 pg   MCHC 33.5 30.0 - 36.0 g/dL   RDW 14.0 11.5 - 15.5 %   Platelets 290 150 - 400 K/uL   nRBC 0.0 0.0 - 0.2 %    Comment: Performed at [facility], [place] [address] [place], [place] [address]  Renal function panel     Status: Abnormal   Collection Time: 11/25/19  5:36 AM  Result Value Ref Range   Sodium 126 (L) 135 - 145 mmol/L   Potassium 3.2 (L) 3.5 - 5.1 mmol/L   Chloride 99 98 - 111  mmol/L   CO2 21 (L) 22 - 32 mmol/L   Glucose, Bld 170 (H) 70 - 99 mg/dL    Comment: Glucose reference range applies only to samples taken after fasting for at least 8 hours.   BUN <5 (L) 6 - 20 mg/dL   Creatinine, Ser 0.35 (L) 0.44 - 1.00 mg/dL   Calcium 7.5 (L) 8.9 - 10.3 mg/dL   Phosphorus 2.9 2.5 - 4.6 mg/dL   Albumin 2.4 (L) 3.5 - 5.0 g/dL   GFR, Estimated >60 >60 mL/min    Comment: (NOTE) Calculated using the CKD-EPI Creatinine Equation (2021)    Anion gap 6 5 - 15    Comment: Performed at [facility], [place] [address] [place], [place] [address]  Magnesium     Status: None   Collection Time: 11/25/19  5:36 AM  Result Value Ref Range   Magnesium 2.1 1.7 - 2.4 mg/dL    Comment: Performed at [facility], [place] [address] [place], [place] [address]  Basic metabolic panel     Status: Abnormal   Collection Time: 11/25/19  1:10 PM  Result Value Ref Range   Sodium 129 (L) 135 - 145 mmol/L   Potassium 4.1 3.5 - 5.1 mmol/L    Comment: DELTA CHECK NOTED NO VISIBLE HEMOLYSIS    Chloride 96 (L) 98 - 111 mmol/L   CO2 25 22 - 32 mmol/L   Glucose, Bld 114 (H) 70 - 99 mg/dL    Comment: Glucose reference range applies only to samples taken after fasting for at least 8 hours.   BUN 5 (L) 6 - 20 mg/dL   Creatinine, Ser 0.43 (L) 0.44 - 1.00 mg/dL   Calcium 7.8 (L) 8.9 - 10.3 mg/dL   GFR, Estimated >60 >60 mL/min    Comment: (NOTE) Calculated using the CKD-EPI  Creatinine Equation (2021)    Anion gap 8 5 - 15    Comment: Performed at [facility], [place] [address], [place], [place] [address]  CBC with Differential/Platelet     Status: Abnormal   Collection Time: 11/26/19  5:18 AM  Result Value Ref Range   WBC 2.4 (L) 4.0 - 10.5 K/uL   RBC 3.23 (L) 3.87 - 5.11 MIL/uL   Hemoglobin 9.6 (L) 12.0 - 15.0 g/dL   HCT 28.9 (L) 36 - 46 %   MCV 89.5 80.0 - 100.0 fL   MCH 29.7 26.0 - 34.0 pg   MCHC 33.2 30.0 - 36.0 g/dL   RDW 14.0 11.5 - 15.5 %   Platelets 315 150 - 400 K/uL   nRBC 0.0 0.0 - 0.2 %   Neutrophils Relative % 91 %   Neutro Abs 2.2 1.7 - 7.7 K/uL   Lymphocytes Relative 8 %   Lymphs Abs 0.2 (L) 0.7 - 4.0 K/uL   Monocytes Relative 1 %   Monocytes Absolute 0.0 (L) 0.1 - 1.0 K/uL   Eosinophils Relative 0 %   Eosinophils Absolute 0.0 0.0 - 0.5 K/uL   Basophils Relative 0 %   Basophils Absolute 0.0 0.0 - 0.1 K/uL   Immature Granulocytes 0 %   Abs Immature Granulocytes 0.01 0.00 - 0.07 K/uL    Comment: Performed at [facility], [place] [address], [place], [place] [address]  Basic metabolic panel     Status: Abnormal   Collection Time: 11/26/19  5:18 AM  Result Value Ref Range   Sodium 126 (L) 135 - 145 mmol/L   Potassium 4.7  3.5 - 5.1 mmol/L   Chloride 96 (L) 98 - 111 mmol/L   CO2 23 22 - 32 mmol/L   Glucose, Bld 206 (H) 70 - 99 mg/dL    Comment: Glucose reference range applies only to samples taken after fasting for at least 8 hours.   BUN 5 (L) 6 - 20 mg/dL   Creatinine, Ser 0.48 0.44 - 1.00 mg/dL   Calcium 8.2 (L) 8.9 - 10.3 mg/dL   GFR, Estimated >60 >60 mL/min    Comment: (NOTE) Calculated using the CKD-EPI Creatinine Equation (2021)    Anion gap 7 5 - 15    Comment: Performed at [facility], [place] [address] [place], [place] [address]  Magnesium     Status: None   Collection Time: 11/26/19  5:18 AM  Result Value Ref Range   Magnesium 2.2 1.7 - 2.4 mg/dL    Comment:  Performed at [facility], [place] [address] [place], [place] [address]  Osmolality     Status: Abnormal   Collection Time: 11/26/19  8:43 AM  Result Value Ref Range   Osmolality 270 (L) 275 - 295 mOsm/kg    Comment: Performed at [facility], [place] [address] [place], [place] [address]  TSH     Status: None   Collection Time: 11/26/19  8:43 AM  Result Value Ref Range   TSH 0.467 0.350 - 4.500 uIU/mL    Comment: Performed by a 3rd Generation assay with a functional sensitivity of <=0.01 uIU/mL. Performed at [facility], [place] [address], [place], [place] [address]     No results found.  Assessment/Plan **Hyponatremia:  Appears at this time to be fairly euvolemic.  Given the circumstances (malignancy, pain) I suspect this is SIADH - urine osms have been ordered and are pending currently.  Adrenal insufficiency can't be ruled out with steroids being given now, but K has been fine so doubt.  Agree with fluid restriction, encourage po intake of solute.  Cont I/Os, daily labs.  No indications for hypertonic saline.   **Metastatic NSCLC **intractable pain  I will follow, page with questions.   Justin Mend 11/26/2019, 11:37 AM

## 2019-11-26 NOTE — Consult Note (Signed)
Palliative Care Consultation Symptom Management  Deborah Foster is a 49 yo with metastatic NSCLC being treated with Tagrisso admitted with intractable NV and epigastric/mid-back pain. She had severe hyponatremia. CT did not show progression of malignancy within the abdomen and pelvis but did show some non-specific changes and inflammation surrounding the biliary tract and pancreas. Labs do not suggest pancreatitis. Her sodium improved with saline hydration. Currently the etiology of her NV and pain is unclear. Palliative care was consulted for symptom management.  Further medical evaluation is being pursued. She received 6 doses of hydrocodone and 1 dose of hydromorphone IV today. She is getting q6 hour zofran but no prns for NV.   At the time of my visit she is resting comfortably. She has just received a dose of hydromorphone. Pain seems to be her primary complaint presently.   Given the possibility that there is an inflammatory process going on in her hepatobiliary region I suspect that her nausea is being mediated from that and triggering her CTZ through the vagal pathways. She also has known osseous mets.  Recommendations:  1. Will order dexamethasone -start with 8mg  IV BID X1 then 8mg  daily followed by rapid taper over 5-7 days depending on how she is responding.  2. Can reduce frequency of scheduled zofran and transition to PRN after steroids are started.  3. Monitor for constipation.   4. She is currently on Hydrocodone and has been taking this as needed for pain prior to admission. I do not recommend using Tramadol because it is the most highly nauseating and emetogenic opioid and lowers the seizure threshold. This is not recommended in patients with risk for hyponatremia or with existing seizure risk. Discontinue Tramadol- it appears she filled this October 26 for pain-so she will need to be told to stop this medication.  Lane Hacker, DO Palliative Medicine

## 2019-11-27 ENCOUNTER — Other Ambulatory Visit (HOSPITAL_COMMUNITY): Payer: Self-pay | Admitting: Internal Medicine

## 2019-11-27 LAB — CBC WITH DIFFERENTIAL/PLATELET
Abs Immature Granulocytes: 0.04 10*3/uL (ref 0.00–0.07)
Basophils Absolute: 0 10*3/uL (ref 0.0–0.1)
Basophils Relative: 0 %
Eosinophils Absolute: 0 10*3/uL (ref 0.0–0.5)
Eosinophils Relative: 0 %
HCT: 31.7 % — ABNORMAL LOW (ref 36.0–46.0)
Hemoglobin: 10.7 g/dL — ABNORMAL LOW (ref 12.0–15.0)
Immature Granulocytes: 1 %
Lymphocytes Relative: 5 %
Lymphs Abs: 0.4 10*3/uL — ABNORMAL LOW (ref 0.7–4.0)
MCH: 30.1 pg (ref 26.0–34.0)
MCHC: 33.8 g/dL (ref 30.0–36.0)
MCV: 89 fL (ref 80.0–100.0)
Monocytes Absolute: 0.3 10*3/uL (ref 0.1–1.0)
Monocytes Relative: 4 %
Neutro Abs: 7.6 10*3/uL (ref 1.7–7.7)
Neutrophils Relative %: 90 %
Platelets: 404 10*3/uL — ABNORMAL HIGH (ref 150–400)
RBC: 3.56 MIL/uL — ABNORMAL LOW (ref 3.87–5.11)
RDW: 14.2 % (ref 11.5–15.5)
WBC: 8.4 10*3/uL (ref 4.0–10.5)
nRBC: 0 % (ref 0.0–0.2)

## 2019-11-27 LAB — BASIC METABOLIC PANEL
Anion gap: 10 (ref 5–15)
BUN: 14 mg/dL (ref 6–20)
CO2: 26 mmol/L (ref 22–32)
Calcium: 9 mg/dL (ref 8.9–10.3)
Chloride: 96 mmol/L — ABNORMAL LOW (ref 98–111)
Creatinine, Ser: 0.52 mg/dL (ref 0.44–1.00)
GFR, Estimated: >60 mL/min (ref >60)
Glucose, Bld: 131 mg/dL — ABNORMAL HIGH (ref 70–99)
Potassium: 4.3 mmol/L (ref 3.5–5.1)
Sodium: 132 mmol/L — ABNORMAL LOW (ref 135–145)

## 2019-11-27 LAB — MAGNESIUM: Magnesium: 2.3 mg/dL (ref 1.7–2.4)

## 2019-11-27 LAB — GLUCOSE, CAPILLARY
Glucose-Capillary: 148 mg/dL — ABNORMAL HIGH (ref 70–99)
Glucose-Capillary: 107 mg/dL — ABNORMAL HIGH (ref 70–99)
Glucose-Capillary: 144 mg/dL — ABNORMAL HIGH (ref 70–99)

## 2019-11-27 MED ORDER — SENNOSIDES-DOCUSATE SODIUM 8.6-50 MG PO TABS: 1.0000 | ORAL_TABLET | Freq: Two times a day (BID) | ORAL | 0 refills | Status: AC

## 2019-11-27 MED ORDER — CALCIUM CARBONATE-VITAMIN D 500-200 MG-UNIT PO TABS: 1.0000 | ORAL_TABLET | Freq: Three times a day (TID) | ORAL | Status: AC

## 2019-11-27 MED ORDER — DEXAMETHASONE 4 MG PO TABS
4.0000 mg | ORAL_TABLET | Freq: Two times a day (BID) | ORAL | 1 refills | Status: DC
Start: 2019-11-27 — End: 2020-01-04

## 2019-11-27 MED ORDER — DICLOFENAC SODIUM 1 % EX GEL
2.0000 g | Freq: Four times a day (QID) | CUTANEOUS | 1 refills | Status: DC
Start: 2019-11-27 — End: 2019-11-27

## 2019-11-27 MED ORDER — HYDROMORPHONE HCL 4 MG PO TABS: 4.0000 mg | ORAL_TABLET | Freq: Four times a day (QID) | ORAL | 0 refills | Status: DC | PRN

## 2019-11-27 MED ORDER — HYDROMORPHONE HCL 2 MG PO TABS
4.0000 mg | ORAL_TABLET | Freq: Four times a day (QID) | ORAL | Status: DC | PRN
  Administered 2019-11-27: 4 mg via ORAL
  Filled 2019-11-27: qty 2

## 2019-11-27 MED ORDER — POLYETHYLENE GLYCOL 3350 17 G PO PACK: 17.0000 g | PACK | Freq: Two times a day (BID) | ORAL | 1 refills | Status: AC

## 2019-11-27 MED ORDER — GABAPENTIN 300 MG PO CAPS
300.0000 mg | ORAL_CAPSULE | Freq: Two times a day (BID) | ORAL | 1 refills | Status: DC
Start: 2019-11-27 — End: 2019-11-30

## 2019-11-27 MED FILL — DEXAMETHASONE 4 MG TABLET: 4 | 14 days supply | Qty: 28 | Fill #0

## 2019-11-27 MED FILL — DICLOFENAC SODIUM 1 % GEL: 1 | 25 days supply | Qty: 200 | Fill #0

## 2019-11-27 MED FILL — GABAPENTIN 300 MG CAPSULE: 300 | 30 days supply | Qty: 60 | Fill #0

## 2019-11-27 MED FILL — HYDROmorphone HCL 4 MG TABS: 4 | 14 days supply | Qty: 56 | Fill #0

## 2019-11-27 NOTE — Progress Notes (Signed)
[facility] Progress Note   Subjective:   Had lots of back pain at 3am that resolved with po pain meds; currently feeling fairly well.  Appetite on limited side. UOP 1720 yesterday, no Intake documented. Serum sodium yesterday PM 133, this AM 132.  Urine osm yesterday at noon 269 - had rec'd lasix 20 IV AM 11/10 and 11/11.  Objective Vitals:   11/26/19 1008 11/26/19 1357 11/26/19 2133 11/27/19 0549  BP: 115/71 108/63 109/65 119/77  Pulse: (!) 108 (!) 111 (!) 118 (!) 110  Resp: (!) 28 14 16 16   Temp: 97.9 F (36.6 C) 97.7 F (36.5 C) 97.8 F (36.6 C)   TempSrc: Oral Oral Oral   SpO2: 100% 100% 97% 100%  Weight:      Height:       Physical Exam Gen: cachectic, ambulating around room easily  Eyes: anicteric ENT: MMM Neck: supple CV:  Tachycardic, regular Abd: soft, thin Back:clear lungs GU: no foley Extr:  No edema Neuro: nonfocal Skin: warm and dry  Additional Objective Labs: Basic Metabolic Panel: Recent Labs  Lab 11/23/19 0544 11/23/19 0544 11/24/19 0640 11/24/19 0640 11/25/19 0536 11/25/19 1310 11/26/19 0518 11/26/19 1532 11/27/19 0504  NA 128*   < > 131*   < > 126*   < > 126* 133* 132*  K 3.3*   < > 3.8   < > 3.2*   < > 4.7 4.0 4.3  CL 100   < > 98   < > 99   < > 96* 99 96*  CO2 21*   < > 24   < > 21*   < > 23 25 26   GLUCOSE 97   < > 105*   < > 170*   < > 206* 143* 131*  BUN 5*   < > <5*   < > <5*   < > 5* 9 14  CREATININE 0.35*   < > 0.45   < > 0.35*   < > 0.48 0.50 0.52  CALCIUM 7.0*   < > 8.2*   < > 7.5*   < > 8.2* 8.8* 9.0  PHOS 1.9*  --  2.9  --  2.9  --   --   --   --    < > = values in this interval not displayed.   Liver Function Tests: Recent Labs  Lab 11/22/19 0626 11/22/19 0626 11/23/19 0544 11/24/19 0640 11/25/19 0536  AST 19  --  14* 22  --   ALT 24  --  19 24  --   ALKPHOS 66  --  61 80  --   BILITOT 0.9  --  0.3 0.5  --   PROT 6.3*  --  5.4* 6.8  --   ALBUMIN 2.8*   < > 2.4* 3.0* 2.4*   < > = values in this  interval not displayed.   Recent Labs  Lab 11/20/19 2300 11/22/19 0623  LIPASE 34 22   CBC: Recent Labs  Lab 11/23/19 0544 11/23/19 0544 11/24/19 0640 11/24/19 0640 11/25/19 0536 11/26/19 0518 11/27/19 0504  WBC 8.4   < > 5.8   < > 3.8* 2.4* 8.4  NEUTROABS 7.5   < > 4.9  --   --  2.2 7.6  HGB 8.7*   < > 10.5*   < > 9.3* 9.6* 10.7*  HCT 25.6*   < > 30.9*   < > 27.8* 28.9* 31.7*  MCV 89.8  --  90.6  --  90.6 89.5 89.0  PLT 277   < > 327   < > 290 315 404*   < > = values in this interval not displayed.   Blood Culture    Component Value Date/Time   SDES  11/07/2019 0818    BLOOD LEFT ANTECUBITAL Performed at [facility] [address] [place], [place] [address]    SPECREQUEST  11/07/2019 0818    BOTTLES DRAWN AEROBIC AND ANAEROBIC Blood Culture adequate volume Performed at [facility] [address], [place], [place] [address]    CULT  11/07/2019 0818    NO GROWTH 5 DAYS Performed at [facility] [address], [place], [place] [address]    REPTSTATUS 11/12/2019 FINAL 11/07/2019 0818    Cardiac Enzymes: No results for input(s): CKTOTAL, CKMB, CKMBINDEX, TROPONINI in the last 168 hours. CBG: Recent Labs  Lab 11/26/19 1138 11/26/19 1615 11/26/19 2133 11/27/19 0737 11/27/19 1141  GLUCAP 153* 124* 134* 148* 107*   Iron Studies: No results for input(s): IRON, TIBC, TRANSFERRIN, FERRITIN in the last 72 hours. @lablastinr3 @ Studies/Results: No results found. Medications: . ondansetron (ZOFRAN) IV     . calcium-vitamin D  1 tablet Oral TID  . dexamethasone  4 mg Oral Q12H  . diclofenac Sodium  2 g Topical QID  . enoxaparin (LOVENOX) injection  30 mg Subcutaneous Q24H  . feeding supplement  1 Container Oral BID BM  . gabapentin  300 mg Oral BID  . insulin aspart  0-9 Units Subcutaneous TID WC  . levETIRAcetam  250 mg Oral Q12H  . loratadine  10 mg Oral Daily  . osimertinib mesylate  80 mg Oral Daily  .  pantoprazole  40 mg Oral Daily  . polyethylene glycol  17 g Oral BID  . senna-docusate  1 tablet Oral BID    Assessment/Plan **Hyponatremia:  Appears at this time to be fairly euvolemic.  Given the circumstances (malignancy, pain) I suspect this is SIADH at this point but on admission seems she truly was hypovolemic.  Urine osms have only been checked once - yesterday am after she'd rec'd furosemide x 2 doses so I don't think the test is reliable.  I think at this point it's reasonable to keep on 1255mL fluid restriction (she is tolerating this fine) and encourage po intake of solute.  She has some boost breeze in room and I encouraged her to drink.  Adrenal insufficiency can't be ruled out with steroids being given now, but K has been fine so doubt.    **Metastatic NSCLC **intractable pain  Dispo - Its fine for her to discharge from my perspective.  I will sign off given serum sodium is now in the 130s.  Call me if needed.  Jannifer Hick MD 11/27/2019, 12:32 PM  [facility] Pager: [phone]

## 2019-11-27 NOTE — Discharge Summary (Signed)
Physician Discharge Summary  Deborah Foster WJX:[id number] DOB: [birth year] DOA: 11/20/2019  PCP: [name], MD  Admit date: 11/20/2019 Discharge date: 11/27/2019  Time spent: 55 minutes  Recommendations for Outpatient Follow-up:  1. Follow-up with Sandi Mealy, PA oncology 11/30/2019 as scheduled. 2. Follow-up with Dr. Lorna Few oncology 12/08/2019 3. Follow-up with [name], MD in 2 to 3 weeks.  On follow-up patient need a basic metabolic profile done to follow-up on electrolytes and renal function.  Patient's hypocalcemia also need to be followed up upon.   Discharge Diagnoses:  Principal Problem:   Hyponatremia Active Problems:   Non-small cell carcinoma of lung, stage 4 (HCC)   Nausea and vomiting   Back pain   Dehydration   Sinus tachycardia   Constipation   Hypokalemia   Hypophosphatemia   Hypocalcemia   Metabolic acidosis   Seizure disorder [facility])   Discharge Condition: Stable and improved  Diet recommendation: Regular  Filed Weights   11/21/19 1120 11/25/19 1002  Weight: 38.6 kg 41 kg    History of present illness:  HPI per Dr. Glynis Smiles is a 49 y.o. female with medical history significant of metastatic lung cancer resented to ED for evaluation of intractable nausea and vomiting with episodic left lower quadrant pain.  Patient stated that she was unable to eat and drink well because of worsening nausea and vomiting and feeling very weak and tired.  Patient reported that her abdominal pain was resolved at this time but she was having 8 out of 10 pain in her back.  Patient otherwise denied fever, chills, chest pain, shortness of breath, hematemesis, hematochezia and urinary symptoms.   ED Course: On arrival to ED her temperature was 97.7, blood pressure 113/70, respiratory rate 18 and oxygen saturation 100% on room air.  Blood work showed WBCs 6, hemoglobin 10.8, sodium 113, potassium 4.4, chloride 80, BUN 14, creatinine 0.3 and blood  glucose 113.  In ED patient was managed with IV normal saline, IV Zofran and IV morphine.  Hospital Course:  1 hyponatremia/hypochloremia Likely multifactorial secondary to hypovolemia from nausea vomiting in the setting of abdominal pain versus secondary to SIADH as patient with a history of non-small cell lung cancer stage IV in addition to uncontrolled pain which may be playing a factor versus adrenal insufficiency as patient with metastases to adrenal glands...  Sodium level at 113 on admission.  Initially on admission hyponatremia was felt secondary to volume depletion. Serum osmolality at 244.  Urine sodium 110.  Urine creatinine of 53.88.  Sodium levels initially improved with hydration and sodium was up to 131 (11/24/2019) however fluctuating and down to 126.  Patient was fluid restricted on 11/25/2019 given a dose of Lasix 20 mg IV x1 with some improvement to 129 but dropped back down to 126.  TSH obtained was within normal limits.  Nephrology subsequently consulted who assessed the patient.  Palliative care had also assessed the patient for better pain management and patient started on Decadron and as such unable to check a random cortisol level due to concerns that adrenal insufficiency may also be playing a role.  Patient's hyponatremia improved such that by day of discharge sodium was up to  132.  Patient also also fluid restricted to 1200 cc/day as recommended by nephrology.  Patient be discharged home in stable and improved condition.  Outpatient follow-up with PCP and oncology.   2.  Non-small cell lung cancer stage IV Patient status post chemotherapy.  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  Complains of back pain and abdominal pain on admission.  CT abdomen and pelvis done with some hypoattenuation in the upper poles and medial kidneys bilaterally with some mild bilateral urothelial thickening left slightly greater than right could be secondary to radiation related  effects versus UTI, redemonstrated patchy enhancement of the pancreas with slightly lobular, thickened appearance without focal lesion or pancreatic ductal dilatation.  Some surrounding stranding seen in the adjacent retroperitoneum could be radiation related changes versus pancreatitis, slight mucosal hyperemia throughout the first portion of the duodenum nonspecific similar to prior exam, low attenuation ascites similar to only minimally increased in volume from prior with some redistribution including subphrenic spaces, fairly consolidated region of the lung in the medial right base with some features of posttreatment related effects and fibrosis, findings favored to reflect posttreatment related effect though superimposed aspiration may be present.  Slight prominence of left parametrial and gonadal vessels which can be seen with pelvic congestion syndrome in appropriate clinical setting.  Aortic atherosclerosis.  Patient with significant back pain (11/25/2019).  Palliative care consulted for symptomatic management of pain.  Patient started on IV Decadron in addition to IV Dilaudid and Vicodin with improvement with pain as recommended by palliative care. Tramadol discontinued by palliative care due to decreasing seizure threshold and worsening nausea.  Outpatient follow-up with oncology.  3.  Sinus tachycardia Likely in the setting of pain and dehydration.  Improved with pain management. Follow.   4.  Nausea vomiting abdominal pain Patient with complaints of significant abdominal pain with nausea (11/25/2019) after initial improvement during the hospitalization.  Patient maintained on IV antiemetics.  Patient was subsequently started on IV Decadron per palliative care with clinical improvement.  Diet was advanced and patient tolerating a soft diet by day of discharge.  CT abdomen and pelvis as noted above.  Patient's pain was managed.  Outpatient follow-up.  5.  Back pain Likely secondary to  metastatic cancer.  Patient had some complaints of significant pain and pain was not controlled.  Palliative care was consulted for pain management.  IV Decadron started per palliative care with clinical improvement in addition to IV Dilaudid and as needed Vicodin.  Tramadol was discontinued per palliative care recommendations due to possibility of worsening nausea and lowering seizure threshold.  Tramadol was discontinued on discharge.  Patient was placed on oral Dilaudid 4 mg every 6 hours as needed as recommended by palliative care and patient transition to oral Decadron 4 mg twice daily until follow-up with oncology.  Patient's pain was better controlled and patient will be discharged in stable and improved condition.  Patient will follow up with oncology and palliative care in the outpatient setting for further titration and management.  6.  Hypokalemia/hypophosphatemia Potassium of 4.0.  Phosphorus repleted.    7.  Constipation Patient maintained on MiraLAX twice daily, Senokot-S twice daily as well as Dulcolax suppository.  Constipation had improved and resolved by day of discharge.  Outpatient follow-up.  8.  Hypocalcemia Status post IV calcium gluconate.  Corrected calcium improved.  Vitamin D level at 37.61.  Calcium currently at 8.8.  Patient placed on oral calcium supplementation.  Outpatient follow-up with PCP.   9.  Metabolic acidosis Resolved with hydration.    10.  Normocytic anemia Likely dilutional effect.  Patient with no overt bleeding.  Hemoglobin remained stable at 10.7 by day of discharge.  Outpatient follow-up with hematology/oncology.   11.  Seizure disorder Patient kept  on home regimen of Keppra.  No seizures noted during the hospitalization.    12.  Underweight Nutrition consulted.  13.  Leukocytosis Likely reactive.  Resolved.      Procedures:    Consultations:  Oncology Palliative care: Dr. Hilma Favors 11/25/2019 Nephrology: Dr. Johnney Ou  11/26/2019  Discharge Exam: Vitals:   11/27/19 0549 11/27/19 1320  BP: 119/77 102/68  Pulse: (!) 110 (!) 102  Resp: 16 15  Temp:  98.1 F (36.7 C)  SpO2: 100% 100%    General: NAD Cardiovascular: RRR Respiratory: CTAB  Discharge Instructions   Discharge Instructions    Diet general   Complete by: As directed    1200CC/DAY FLUID RESTRICTION   Increase activity slowly   Complete by: As directed      Allergies as of 11/27/2019      Reactions   Other Other (See Comments)   Patient states that there is some she takes that makes her throat "cry" AND SCRATCHY    Tramadol Nausea And Vomiting   Lowers seizure threshold   Doxycycline Nausea And Vomiting      Medication List    STOP taking these medications   HYDROcodone-acetaminophen 5-325 MG tablet Commonly known as: NORCO/VICODIN   prochlorperazine 10 MG tablet Commonly known as: COMPAZINE   rivaroxaban 20 MG Tabs tablet Commonly known as: Xarelto   Xalkori 250 MG capsule Generic drug: crizotinib     TAKE these medications   calcium-vitamin D 500-200 MG-UNIT tablet Commonly known as: OSCAL WITH D Take 1 tablet by mouth 3 (three) times daily.   dexamethasone 4 MG tablet Commonly known as: DECADRON Take 1 tablet (4 mg total) by mouth every 12 (twelve) hours.   diclofenac Sodium 1 % Gel Commonly known as: VOLTAREN Apply 2 g topically 4 (four) times daily.   gabapentin 300 MG capsule Commonly known as: NEURONTIN Take 1 capsule (300 mg total) by mouth 2 (two) times daily.   HYDROmorphone 4 MG tablet Commonly known as: DILAUDID Take 1 tablet (4 mg total) by mouth every 6 (six) hours as needed for severe pain.   levETIRAcetam 250 MG tablet Commonly known as: KEPPRA Take 1 tablet (250 mg total) by mouth 2 (two) times daily.   loratadine 10 MG tablet Commonly known as: CLARITIN Take 1 tablet (10 mg total) by mouth daily.   ondansetron 8 MG tablet Commonly known as: ZOFRAN Take 8 mg by mouth every 8  (eight) hours as needed for nausea or vomiting.   pantoprazole 40 MG tablet Commonly known as: PROTONIX Take 40 mg by mouth daily.   polyethylene glycol 17 g packet Commonly known as: MIRALAX / GLYCOLAX Take 17 g by mouth 2 (two) times daily.   senna-docusate 8.6-50 MG tablet Commonly known as: Senokot-S Take 1 tablet by mouth 2 (two) times daily.   Tagrisso 80 MG tablet Generic drug: osimertinib mesylate TAKE 1 TABLET (80 MG TOTAL) BY MOUTH DAILY. What changed: See the new instructions.   Vitamin B-12 2500 MCG Subl Place 2,500 mcg under the tongue daily.      Allergies  Allergen Reactions  . Other Other (See Comments)    Patient states that there is some she takes that makes her throat "cry" AND SCRATCHY   . Tramadol Nausea And Vomiting    Lowers seizure threshold  . Doxycycline Nausea And Vomiting    Follow-up Information    Harle Stanford., PA-C Follow up on 11/30/2019.   Specialty: Oncology Why: f/u as scheduled Contact information: [address] [address] [phone]        [name], MD Follow up on 12/08/2019.   Specialty: Oncology Why: f/u as scheduled Contact information: [place] [address] [phone]        Elkins, Wilson Oliver, MD. Schedule an appointment as soon as possible for a visit in 3 week(s).   Specialty: Family Medicine Why: f/u in 2-3 weeks. Contact information: [place] [address] [phone]                The results of significant diagnostics from this hospitalization (including imaging, microbiology, ancillary and laboratory) are listed below for reference.    Significant Diagnostic Studies: CT Angio Chest PE W and/or Wo Contrast  Result Date: 11/07/2019 CLINICAL DATA:  Metastatic lung cancer with right-sided abdominal pain EXAM: CT ANGIOGRAPHY CHEST CT ABDOMEN AND PELVIS WITH CONTRAST TECHNIQUE: Multidetector CT imaging of the chest  was performed using the standard protocol during bolus administration of intravenous contrast. Multiplanar CT image reconstructions and MIPs were obtained to evaluate the vascular anatomy. Multidetector CT imaging of the abdomen and pelvis was performed using the standard protocol during bolus administration of intravenous contrast. CONTRAST:  132mL OMNIPAQUE IOHEXOL 350 MG/ML SOLN COMPARISON:  10/09/2019 FINDINGS: CTA CHEST FINDINGS Cardiovascular: Normal heart size with no pericardial effusion. Large main pulmonary artery with presumed pulmonary hypertension. Asymmetric poor opacification of right lower lobe pulmonary arteries, likely hypoxic mediated shunting due to the degree of scarring. No pulmonary emboli are seen. Mediastinum/Nodes: No lymphadenopathy. Lungs/Pleura: Radiation fibrosis appearance to the right upper, right middle, and right lower lobes with traction bronchiectasis and architectural distortion, unchanged. Trace pleural fluid on the right. Clear left lung. Musculoskeletal: Area of sclerosis to the lateral right third rib and medial right clavicle, stable and possibly radiation osteitis or metastatic disease, stable. No acute fracture. Review of the MIP images confirms the above findings. CT ABDOMEN and PELVIS FINDINGS Hepatobiliary: Band of hypointensity in the inferior right lobe liver, given constellation of findings suspect radiation changes. Mild chronic intra and extrahepatic bile duct dilatation. Small cystic densities in the subcapsular liver. Pancreas: Patchy increased enhancement in the pancreatic body which is mildly expanded. Spleen: Small spleen without focal abnormality Adrenals/Urinary Tract: Negative adrenals. Best seen on coronal reformats there is hypoenhancement in the medullary spaces of the upper pole kidneys where there is also less cortical thickness. Prominent urothelial thickness along the bilateral upper ureters, similar to prior. Unremarkable bladder. Stomach/Bowel: No  definite bowel inflammation and no bowel obstruction. Vascular/Lymphatic: Atheromatous plaque with stable strandy density around the aorta. No mass or adenopathy. Reproductive:No pathologic findings. Other: Small volume pelvic ascites considered reactive. Musculoskeletal: Osseous metastatic disease with most confluent sclerosis in left acetabulum where there is expansion. No soft tissue tumor extension or pathologic fracture is seen to explain the history. Review of the MIP images confirms the above findings. IMPRESSION: Chest CTA: 1. No evidence of pulmonary embolism. 2. Stable radiation changes in the right lung with trace pleural fluid. Abdominal CT: 1. Band of abnormality affecting the liver, pancreas, and upper pole kidneys likely related to adrenal radiation (if administered). Please correlate with labs for pancreatitis. 2. Known osseous metastatic disease without acute fracture. 3. Prominent bilateral upper ureteral thickness, stable and without hydronephrosis. Electronically Signed   By: Monte Fantasia M.D.   On: 11/07/2019 10:38   CT ABDOMEN PELVIS W CONTRAST  Result Date: 11/21/2019 CLINICAL DATA:  Known lung cancer, on oral chemotherapy, emesis  since since yesterday EXAM: CT ABDOMEN AND PELVIS WITH CONTRAST TECHNIQUE: Multidetector CT imaging of the abdomen and pelvis was performed using the standard protocol following bolus administration of intravenous contrast. CONTRAST:  75mL OMNIPAQUE IOHEXOL 300 MG/ML  SOLN COMPARISON:  CT chest, abdomen and pelvis 11/07/2019 FINDINGS: Lower chest: Fairly consolidated region of lung in the medial right lung base with some features of post treatment related effect and fibrosis albeit with several fluid-filled airways in the non aerated portion of the lung. Small volume right pleural fluid is noted as well. Left base is clear. Stable sclerotic focus in lateral right sixth rib. Normal heart size. No pericardial effusion. Trace pericardial fluid is unchanged from  prior. Hepatobiliary: Redemonstrated bandlike region of parenchymal hypoattenuation extending throughout the right lobe liver. Several scattered subcentimeter hypoattenuating foci are present throughout the liver, also unchanged from priors. Some mild intra and extrahepatic biliary ductal dilatation is similar to comparison studies without visible intraductal gallstones or other filling defect evident. Gallbladder Lea partially distended without significant wall thickening but with some fluid localized to the porta hepatis gallbladder fossa. Pancreas: Redemonstrated patchy enhancement of the pancreas with a slightly lobular, thickened appearance albeit without focal lesion or pancreatic ductal dilatation. Some surrounding stranding seen in the adjacent retroperitoneum. Spleen: Diminutive spleen. No focal lesion. Small volume perisplenic free fluid seen posteriorly. Adrenals/Urinary Tract: Normal adrenal glands. Increasingly conspicuous hypoattenuation of the upper poles and medial kidneys bilaterally. Additionally, there is some mild bilateral urothelial thickening, left slightly greater than right. No focal liver lesion is seen. No urolithiasis. Urinary bladder is largely decompressed at the time of exam and therefore poorly evaluated by CT imaging. Some mild bladder wall thickening is nonspecific. Stomach/Bowel: Distal esophagus is unremarkable. Some thickening of the gastric antrum likely related to normal peristalsis. Some slight mucosal hyperemia throughout the first portion of the duodenum is nonspecific and similar to the comparison exam. Some adjacent inflammatory changes may be redistributed. No other small bowel thickening or conspicuous enhancement. No dilatation or evidence of obstruction. A normal appendix is visualized. No colonic dilatation or wall thickening. Vascular/Lymphatic: Atherosclerotic calcifications within the abdominal aorta and branch vessels. No aneurysm or ectasia. No enlarged  abdominopelvic lymph nodes. Some reactive appearing and possibly edematous nodes in the upper abdomen. Reproductive: Slight prominence of the left parametrial and gonadal vessels. Otherwise normal appearance of the anteverted uterus without other concerning adnexal abnormality. Other: Increasing upper abdominal edematous changes predominantly centered in the central mesentery and retroperitoneum. No free air. No bowel containing hernia. Low-attenuation ascites similar to only minimally increased in volume from prior with some redistribution including to the subphrenic spaces. Musculoskeletal: Redemonstration of the diffuse osseous metastatic disease including sclerotic foci in the spine and more expansile lytic and sclerotic appearance of the left ilium and acetabulum. IMPRESSION: 1. Hypoattenuation of the upper poles and medial kidneys bilaterally with some mild bilateral urothelial thickening, left slightly greater than right. Could be related to radiation related effects though could correlate with urinalysis to exclude urinary tract infection. 2. Redemonstrated patchy enhancement of the pancreas with a slightly lobular, thickened appearance albeit without focal lesion or pancreatic ductal dilatation. Some surrounding stranding seen in the adjacent retroperitoneum. While this could reflect radiation related changes a pancreatitis could present similarly. Correlate with lipase. 3. Slight mucosal hyperemia throughout the first portion of the duodenum is nonspecific and similar to the comparison exam. Possibly within a radiation field as well with additional inflammatory changes in the upper abdomen and retroperitoneum. 4. Low-attenuation ascites  similar to only minimally increased in volume from prior with some redistribution including to the subphrenic spaces. 5. Fairly consolidated region of lung in the medial right lung base with some features of post treatment related effect and fibrosis albeit with several  fluid-filled airways in the non aerated portion of the lung. Findings are favored to reflect post treatment related effect though superimposed aspiration may be present. 6. Slight prominence of the left parametrial and gonadal vessels, which can be seen with pelvic congestion syndrome in the appropriate clinical setting. 7. Aortic Atherosclerosis (ICD10-I70.0). Electronically Signed   By: Lovena Palencia M.D.   On: 11/21/2019 02:00   CT ABDOMEN PELVIS W CONTRAST  Result Date: 11/07/2019 CLINICAL DATA:  Metastatic lung cancer with right-sided abdominal pain EXAM: CT ANGIOGRAPHY CHEST CT ABDOMEN AND PELVIS WITH CONTRAST TECHNIQUE: Multidetector CT imaging of the chest was performed using the standard protocol during bolus administration of intravenous contrast. Multiplanar CT image reconstructions and MIPs were obtained to evaluate the vascular anatomy. Multidetector CT imaging of the abdomen and pelvis was performed using the standard protocol during bolus administration of intravenous contrast. CONTRAST:  180mL OMNIPAQUE IOHEXOL 350 MG/ML SOLN COMPARISON:  10/09/2019 FINDINGS: CTA CHEST FINDINGS Cardiovascular: Normal heart size with no pericardial effusion. Large main pulmonary artery with presumed pulmonary hypertension. Asymmetric poor opacification of right lower lobe pulmonary arteries, likely hypoxic mediated shunting due to the degree of scarring. No pulmonary emboli are seen. Mediastinum/Nodes: No lymphadenopathy. Lungs/Pleura: Radiation fibrosis appearance to the right upper, right middle, and right lower lobes with traction bronchiectasis and architectural distortion, unchanged. Trace pleural fluid on the right. Clear left lung. Musculoskeletal: Area of sclerosis to the lateral right third rib and medial right clavicle, stable and possibly radiation osteitis or metastatic disease, stable. No acute fracture. Review of the MIP images confirms the above findings. CT ABDOMEN and PELVIS FINDINGS  Hepatobiliary: Band of hypointensity in the inferior right lobe liver, given constellation of findings suspect radiation changes. Mild chronic intra and extrahepatic bile duct dilatation. Small cystic densities in the subcapsular liver. Pancreas: Patchy increased enhancement in the pancreatic body which is mildly expanded. Spleen: Small spleen without focal abnormality Adrenals/Urinary Tract: Negative adrenals. Best seen on coronal reformats there is hypoenhancement in the medullary spaces of the upper pole kidneys where there is also less cortical thickness. Prominent urothelial thickness along the bilateral upper ureters, similar to prior. Unremarkable bladder. Stomach/Bowel: No definite bowel inflammation and no bowel obstruction. Vascular/Lymphatic: Atheromatous plaque with stable strandy density around the aorta. No mass or adenopathy. Reproductive:No pathologic findings. Other: Small volume pelvic ascites considered reactive. Musculoskeletal: Osseous metastatic disease with most confluent sclerosis in left acetabulum where there is expansion. No soft tissue tumor extension or pathologic fracture is seen to explain the history. Review of the MIP images confirms the above findings. IMPRESSION: Chest CTA: 1. No evidence of pulmonary embolism. 2. Stable radiation changes in the right lung with trace pleural fluid. Abdominal CT: 1. Band of abnormality affecting the liver, pancreas, and upper pole kidneys likely related to adrenal radiation (if administered). Please correlate with labs for pancreatitis. 2. Known osseous metastatic disease without acute fracture. 3. Prominent bilateral upper ureteral thickness, stable and without hydronephrosis. Electronically Signed   By: Monte Fantasia M.D.   On: 11/07/2019 10:38   DG Chest Port 1 View  Result Date: 11/07/2019 CLINICAL DATA:  Questionable sepsis EXAM: PORTABLE CHEST 1 VIEW COMPARISON:  08/03/2019 FINDINGS: Asymmetric volume loss and opacity on the right where  there is  radiation fibrosis by recent CT. The left chest is clear. Normal heart size and stable mediastinal contours. No visible effusion or pneumothorax. IMPRESSION: Stable post treatment changes on the right. Electronically Signed   By: Monte Fantasia M.D.   On: 11/07/2019 07:58    Microbiology: Recent Results (from the past 240 hour(s))  Respiratory Panel by RT PCR (Flu A&B, Covid) - Nasopharyngeal Swab     Status: None   Collection Time: 11/21/19  2:38 AM   Specimen: Nasopharyngeal Swab  Result Value Ref Range Status   SARS Coronavirus 2 by RT PCR NEGATIVE NEGATIVE Final    Comment: (NOTE) SARS-CoV-2 target nucleic acids are NOT DETECTED.  The SARS-CoV-2 RNA is generally detectable in upper respiratoy specimens during the acute phase of infection. The lowest concentration of SARS-CoV-2 viral copies this assay can detect is 131 copies/mL. A negative result does not preclude SARS-Cov-2 infection and should not be used as the sole basis for treatment or other patient management decisions. A negative result may occur with  improper specimen collection/handling, submission of specimen other than nasopharyngeal swab, presence of viral mutation(s) within the areas targeted by this assay, and inadequate number of viral copies (<131 copies/mL). A negative result must be combined with clinical observations, patient history, and epidemiological information. The expected result is Negative.  Fact Sheet for Patients:  PinkCheek.be  Fact Sheet for Healthcare Providers:  GravelBags.it  This test is no t yet approved or cleared by the [place] FDA and  has been authorized for detection and/or diagnosis of SARS-CoV-2 by FDA under an Emergency Use Authorization (EUA). This EUA will remain  in effect (meaning this test can be used) for the duration of the COVID-19 declaration under Section 564(b)(1) of the Act, 21 U.S.C. section  360bbb-3(b)(1), unless the authorization is terminated or revoked sooner.     Influenza A by PCR NEGATIVE NEGATIVE Final   Influenza B by PCR NEGATIVE NEGATIVE Final    Comment: (NOTE) The Xpert Xpress SARS-CoV-2/FLU/RSV assay is intended as an aid in  the diagnosis of influenza from Nasopharyngeal swab specimens and  should not be used as a sole basis for treatment. Nasal washings and  aspirates are unacceptable for Xpert Xpress SARS-CoV-2/FLU/RSV  testing.  Fact Sheet for Patients: PinkCheek.be  Fact Sheet for Healthcare Providers: GravelBags.it  This test is not yet approved or cleared by the [place] FDA and  has been authorized for detection and/or diagnosis of SARS-CoV-2 by  FDA under an Emergency Use Authorization (EUA). This EUA will remain  in effect (meaning this test can be used) for the duration of the  Covid-19 declaration under Section 564(b)(1) of the Act, 21  U.S.C. section 360bbb-3(b)(1), unless the authorization is  terminated or revoked. Performed at [facility], [place] [address] [place], [place] [address]      Labs: Basic Metabolic Panel: Recent Labs  Lab 11/22/19 0626 11/22/19 1351 11/23/19 0544 11/23/19 0544 11/24/19 0640 11/24/19 0640 11/25/19 0536 11/25/19 1310 11/26/19 0518 11/26/19 1532 11/27/19 0504  NA 128*   < > 128*   < > 131*   < > 126* 129* 126* 133* 132*  K 3.8   < > 3.3*   < > 3.8   < > 3.2* 4.1 4.7 4.0 4.3  CL 99   < > 100   < > 98   < > 99 96* 96* 99 96*  CO2 20*   < > 21*   < > 24   < >  21* 25 23 25 26   GLUCOSE 63*   < > 97   < > 105*   < > 170* 114* 206* 143* 131*  BUN 6   < > 5*   < > <5*   < > <5* 5* 5* 9 14  CREATININE 0.46   < > 0.35*   < > 0.45   < > 0.35* 0.43* 0.48 0.50 0.52  CALCIUM 7.7*   < > 7.0*   < > 8.2*   < > 7.5* 7.8* 8.2* 8.8* 9.0  MG 2.2  --  2.1  --  2.3  --  2.1  --  2.2  --  2.3  PHOS 1.9*  --  1.9*  --  2.9  --  2.9  --   --    --   --    < > = values in this interval not displayed.   Liver Function Tests: Recent Labs  Lab 11/20/19 2300 11/22/19 0626 11/23/19 0544 11/24/19 0640 11/25/19 0536  AST 29 19 14* 22  --   ALT 34 24 19 24   --   ALKPHOS 74 66 61 80  --   BILITOT 0.6 0.9 0.3 0.5  --   PROT 7.3 6.3* 5.4* 6.8  --   ALBUMIN 3.6 2.8* 2.4* 3.0* 2.4*   Recent Labs  Lab 11/20/19 2300 11/22/19 0623  LIPASE 34 22   No results for input(s): AMMONIA in the last 168 hours. CBC: Recent Labs  Lab 11/22/19 0626 11/22/19 0626 11/23/19 0544 11/24/19 0640 11/25/19 0536 11/26/19 0518 11/27/19 0504  WBC 12.3*   < > 8.4 5.8 3.8* 2.4* 8.4  NEUTROABS 11.4*  --  7.5 4.9  --  2.2 7.6  HGB 10.0*   < > 8.7* 10.5* 9.3* 9.6* 10.7*  HCT 30.0*   < > 25.6* 30.9* 27.8* 28.9* 31.7*  MCV 90.9   < > 89.8 90.6 90.6 89.5 89.0  PLT 324   < > 277 327 290 315 404*   < > = values in this interval not displayed.   Cardiac Enzymes: No results for input(s): CKTOTAL, CKMB, CKMBINDEX, TROPONINI in the last 168 hours. BNP: BNP (last 3 results) No results for input(s): BNP in the last 8760 hours.  ProBNP (last 3 results) No results for input(s): PROBNP in the last 8760 hours.  CBG: Recent Labs  Lab 11/26/19 1615 11/26/19 2133 11/27/19 0737 11/27/19 1141 11/27/19 1639  GLUCAP 124* 134* 148* 107* 144*       Signed:  Irine Seal MD.  [facility] 11/27/2019, 5:44 PM

## 2019-11-27 NOTE — Plan of Care (Signed)

## 2019-11-27 NOTE — Progress Notes (Signed)
Patient stable.  NT removed IV and assisted patient in gathering all personal belongings.  RN picked up medications for patient at [facility] outpatient pharmacy and gave them to patient.  RN reviewed discharge paperwork with patient. RN transported patient to exit where husband was waiting.  Patient safely entered vehicle for home.

## 2019-11-30 ENCOUNTER — Other Ambulatory Visit: Payer: Self-pay | Admitting: Medical

## 2019-11-30 ENCOUNTER — Other Ambulatory Visit: Payer: Self-pay

## 2019-11-30 ENCOUNTER — Inpatient Hospital Stay: Payer: Medicare Other

## 2019-11-30 ENCOUNTER — Inpatient Hospital Stay: Payer: Medicare Other | Attending: Internal Medicine | Admitting: Medical

## 2019-11-30 VITALS — BP 120/75 | HR 112 | Temp 98.8°F | Resp 16 | Ht 59.0 in | Wt 82.7 lb

## 2019-11-30 DIAGNOSIS — E871 Hypo-osmolality and hyponatremia: Secondary | ICD-10-CM | POA: Diagnosis not present

## 2019-11-30 DIAGNOSIS — C772 Secondary and unspecified malignant neoplasm of intra-abdominal lymph nodes: Secondary | ICD-10-CM | POA: Diagnosis not present

## 2019-11-30 DIAGNOSIS — Z79899 Other long term (current) drug therapy: Secondary | ICD-10-CM | POA: Diagnosis not present

## 2019-11-30 DIAGNOSIS — E860 Dehydration: Secondary | ICD-10-CM | POA: Diagnosis not present

## 2019-11-30 DIAGNOSIS — C7972 Secondary malignant neoplasm of left adrenal gland: Secondary | ICD-10-CM

## 2019-11-30 DIAGNOSIS — K5903 Drug induced constipation: Secondary | ICD-10-CM | POA: Diagnosis not present

## 2019-11-30 DIAGNOSIS — R630 Anorexia: Secondary | ICD-10-CM | POA: Diagnosis not present

## 2019-11-30 DIAGNOSIS — Z86718 Personal history of other venous thrombosis and embolism: Secondary | ICD-10-CM | POA: Insufficient documentation

## 2019-11-30 DIAGNOSIS — C3431 Malignant neoplasm of lower lobe, right bronchus or lung: Secondary | ICD-10-CM | POA: Insufficient documentation

## 2019-11-30 DIAGNOSIS — Z923 Personal history of irradiation: Secondary | ICD-10-CM | POA: Diagnosis not present

## 2019-11-30 DIAGNOSIS — G893 Neoplasm related pain (acute) (chronic): Secondary | ICD-10-CM | POA: Insufficient documentation

## 2019-11-30 DIAGNOSIS — C7931 Secondary malignant neoplasm of brain: Secondary | ICD-10-CM | POA: Diagnosis not present

## 2019-11-30 DIAGNOSIS — C7951 Secondary malignant neoplasm of bone: Secondary | ICD-10-CM

## 2019-11-30 LAB — CBC WITH DIFFERENTIAL (CANCER CENTER ONLY)
Abs Immature Granulocytes: 0.11 10*3/uL — ABNORMAL HIGH (ref 0.00–0.07)
Basophils Absolute: 0 10*3/uL (ref 0.0–0.1)
Basophils Relative: 0 %
Eosinophils Absolute: 0 10*3/uL (ref 0.0–0.5)
Eosinophils Relative: 0 %
HCT: 32.2 % — ABNORMAL LOW (ref 36.0–46.0)
Hemoglobin: 10.9 g/dL — ABNORMAL LOW (ref 12.0–15.0)
Immature Granulocytes: 2 %
Lymphocytes Relative: 10 %
Lymphs Abs: 0.7 10*3/uL (ref 0.7–4.0)
MCH: 29.9 pg (ref 26.0–34.0)
MCHC: 33.9 g/dL (ref 30.0–36.0)
MCV: 88.2 fL (ref 80.0–100.0)
Monocytes Absolute: 1.1 10*3/uL — ABNORMAL HIGH (ref 0.1–1.0)
Monocytes Relative: 15 %
Neutro Abs: 5.2 10*3/uL (ref 1.7–7.7)
Neutrophils Relative %: 73 %
Platelet Count: 272 10*3/uL (ref 150–400)
RBC: 3.65 MIL/uL — ABNORMAL LOW (ref 3.87–5.11)
RDW: 14.1 % (ref 11.5–15.5)
WBC Count: 7.2 10*3/uL (ref 4.0–10.5)
nRBC: 0 % (ref 0.0–0.2)

## 2019-11-30 LAB — CMP (CANCER CENTER ONLY)
ALT: 41 U/L (ref 0–44)
AST: 33 U/L (ref 15–41)
Albumin: 3.2 g/dL — ABNORMAL LOW (ref 3.5–5.0)
Alkaline Phosphatase: 99 U/L (ref 38–126)
Anion gap: 8 (ref 5–15)
BUN: 11 mg/dL (ref 6–20)
CO2: 28 mmol/L (ref 22–32)
Calcium: 8.9 mg/dL (ref 8.9–10.3)
Chloride: 91 mmol/L — ABNORMAL LOW (ref 98–111)
Creatinine: 0.7 mg/dL (ref 0.44–1.00)
GFR, Estimated: >60 mL/min (ref >60)
Glucose, Bld: 127 mg/dL — ABNORMAL HIGH (ref 70–99)
Potassium: 4.2 mmol/L (ref 3.5–5.1)
Sodium: 127 mmol/L — ABNORMAL LOW (ref 135–145)
Total Bilirubin: 0.4 mg/dL (ref 0.3–1.2)
Total Protein: 7.3 g/dL (ref 6.5–8.1)

## 2019-11-30 MED ORDER — LACTULOSE 10 GM/15ML PO SOLN: 20.0000 g | Freq: Two times a day (BID) | ORAL | 2 refills | Status: DC | PRN

## 2019-11-30 MED ORDER — GABAPENTIN 300 MG PO CAPS: 600.0000 mg | ORAL_CAPSULE | Freq: Two times a day (BID) | ORAL | 1 refills | Status: DC

## 2019-11-30 MED FILL — LACTULOSE 10 GM/15 ML SOLN: 10 | 31 days supply | Qty: 1892 | Fill #0

## 2019-11-30 NOTE — Patient Instructions (Signed)
COVID-19: Cch b?o v? b?n thn qu v? v nh?ng ng??i khc COVID-19: How to Protect Yourself and Others Bi?t vi rt ny ly lan nh? th? no  Hi?n khng c v?c xin no ng?n ng?a ???c b?nh do coronavirus 2019 (COVID-19) gy ra.  Cch t?t nh?t ?? ng?n ng?a b?nh l trnh ph?i nhi?m v?i vi rt ny.  Vi rt ???c cho l ly lan ch? y?u t? ng??i sang ng??i. ? Gi?a nh?ng ng??i ti?p xc g?n v?i nhau (trong kho?ng 6 feet = kho?ng 2 mt). ? Thng qua cc gi?t b?n ? ???ng h h?p t?o ra khi ng??i b? nhi?m b?nh ho, h?t h?i ho?c ni chuy?n. ? Nh?ng gi?t b?n ny c th? r?i vo mi?ng ho?c m?i c?a nh?ng ng??i g?n ? ho?c c th? b? ht vo ph?i. ? COVID-19 c th? ly lan t? nh?ng ng??i khng c tri?u ch?ng. M?i ng??i c?n ph?i V? sinh tay th??ng xuyn  R?a tay th??ng xuyn b?ng x phng v n??c trong t nh?t 20 giy, ??c bi?t l sau khi qu v? ? n?i cng c?ng v?, ho?c sau khi x m?i, ho, ho?c h?t h?i.  N?u khng c s?n x phng v n??c, hy s? d?ng dung d?ch st trng tay c t nh?t 60% c?n. Bi dung d?ch ln t?t c? cc b? m?t c?a bn tay v ch hai bn tay vo nhau cho ??n khi c?m th?y kh.  Trnh ch?m tay ch?a r?a ln m?t, m?i v mi?ng. Trnh ti?p xc g?n  H?n ch? ti?p xc v?i nh?ng ng??i khc cng nhi?u cng t?t.  Trnh ti?p xc g?n v?i ng??i ?m.  Gi? kho?ng cch gi?a b?n thn qu v? v ng??i khc. ? Hy nh? r?ng m?t s? ng??i khng c tri?u ch?ng c th? ly lan vi rt. ? ?i?u ny ??c bi?t quan tr?ng ??i v?i nh?ng ng??i c nguy c? cao b? b?nh [email or web address] Hy che mi?ng v m?i qu v? b?ng kh?u trang khi c nh?ng ng??i khc xung [place] v? c th? ly lan COVID-19 cho nh?ng ng??i khc ngay c? khi qu v? khng c?m th?y b? b?nh.  M?i ng??i nn ?eo kh?u trang khi ? n?i cng c?ng v khi ? xung quanh nh?ng ng??i v gia c?, ??c bi?t l khi kh duy tr gin cch x h?i. ? Khng ???c ?eo kh?u trang cho tr? nh? d??i 2 tu?i, b?t k? ai  b? kh th?, ho?c b?t t?nh, b? thi?u n?ng ho?c khng th? t? tho kh?u trang m khng c?n tr? gip.  Kh?u trang l bi?n php b?o v? ng??i khc trong tr??ng h?p qu v? b? nhi?m b?nh.  [place] s? d?ng kh?u trang dnh cho nhn vin y t?.  Ti?p t?c gi? kho?ng cch kho?ng 6 feet = kho?ng 2 mt gi?a b?n thn qu v? v ng??i khc. Kh?u trang khng thay th? cho vi?c gin cch x h?i. Che mi?ng v m?i khi ho v h?t h?i  Lun che mi?ng v m?i b?ng kh?n gi?y khi qu v? ho ho?c h?t h?i ho?c s? d?ng m?t trong khu?u tay qu v?.  V?t b? kh?n gi?y ? s? d?ng vo thng rc.  Ngay l?p t?c r?a tay b?ng x phng v n??c trong t nh?t 20 giy. N?u khng c s?n x phng v n??c, hy v? sinh hai bn tay b?ng dung d?ch st trng tay c t nh?t 60% c?n. V? sinh v kh? trng  V? sinh V kh? trng cc b? m?t th??ng xuyn  ch?m vo hng ngy. Cc b? m?t ny bao g?m bn, tay n?m c?a, cng t?c ?n, m?t bn, tay c?m, bn lm vi?c, ?i?n tho?i, bn phm, nh v? sinh, vi n??c v b?n r?a. [email or web address]  N?u cc b? m?t b?n, hy lm s?ch cc b? m?t ?: S? d?ng ch?t t?y r?a ho?c x phng v n??c tr??c khi kh? trng.  Sau ?, s? d?ng ch?t kh? trng gia d?ng. C th? xem danh sch cc ch?t kh? trng gia d?ng ? ??ng k EPA t?i ?y. [email or web address] 02/23/2018 Thng tin ny khng nh?m m?c ?ch thay th? cho l?i khuyn m chuyn gia ch?m [place] s?c kh?e ni v?i qu v?. Hy b?o ??m qu v? ph?i th?o lu?n b?t k? v?n ?? g m qu v? c v?i chuyn gia ch?m [place] s?c kh?e c?a qu v?. Document Revised: 09/30/2018 Document Reviewed: 07/25/2018 Elsevier Patient Education  2020 [employer].

## 2019-12-03 MED FILL — levETIRAcetam 250 MG TABS: 250 | 30 days supply | Qty: 60 | Fill #5

## 2019-12-03 MED FILL — XARELTO 20 MG TABLET: 20 | 30 days supply | Qty: 30 | Fill #1

## 2019-12-03 MED FILL — TAGRISSO 80 MG TABLET: 80 | 30 days supply | Qty: 30 | Fill #8

## 2019-12-04 NOTE — Progress Notes (Signed)
[facility] Progress Note   GABRIELLIA REMPEL [id number] 06/07/1970 49 y.o.  Cynde T Richoux is managed by Dr. Fanny Bien. 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  Additionally she is on dexamethasone 4 mg every 12 hours and gabapentin 300 mg p.o. twice daily.  Gabapentin was increased to 600 mg p.o. twice daily.  Dr. Julien Nordmann will discuss the patient's pain when she returns on 12/08/2019.  Drug-induced constipation: The patient was given a prescription for lactulose 20 mg p.o. twice daily.  Metastatic malignant neoplasm of the right lung with adrenal, brain, bone and lymph node metastasis: The patient continues on [place].  She is scheduled to see Dr. Julien Nordmann in follow-up on 12/08/2019.  Please see After Visit Summary for patient specific instructions.  Future Appointments  Date Time Provider [place]  12/08/2019  8:15 AM [facility] None  12/08/2019  8:45 AM [name], MD [facility] None  12/16/2019  9:45 AM [facility] [place] FLUSH [facility] None  02/08/2020  7:00 AM [facility] CONFERENCE [facility] None  02/08/2020  9:30 AM Vaslow, Acey Lav, MD [facility] None    No orders of the defined types were placed in this encounter.      Subjective:   Patient ID:  SWETHA RAYLE is a 49 y.o. (DOB  12-26-1970) female.  Chief Complaint:  Chief Complaint  Patient presents with  . Follow-up    Recent Hospital Discharge    HPI KAJAH SANTIZO  is a 49 y.o. female with a diagnosis of a metastatic malignant neoplasm of the right lung with adrenal, brain, bone and lymph node metastasis.  She is followed by Dr. Julien Nordmann and continues on [place].  She was recently hospitalized from 11/20/2019 through 11/27/2019 for hyponatremia.  She also had nausea and vomiting, back pain, dehydration, constipation, hypokalemia, hypophosphatemia, hypocalcemia, and metabolic acidosis.  She was seen by palliative care during her hospitalization and was begun on Dilaudid 4 mg every 6 hours, dexamethasone 4 mg every 12 hours, and continues on gabapentin 300 mg p.o. twice daily.  Despite this she states that her back pain is only control for approximately 4 hours.  She continues to have constipation, fatigue, and weakness.  She has a prescription for senna and MiraLAX at home and has been using these twice daily.  She is on fluid restriction of 1200 mL/day because of her hyponatremia.  Medications: I have reviewed the patient's current medications.  Allergies:  Allergies  Allergen Reactions  . Other Other (See Comments)    Patient states that there is some she takes that makes her throat "cry" AND SCRATCHY   . Tramadol Nausea And Vomiting    Lowers seizure threshold  . Doxycycline Nausea And Vomiting    Past Medical History:  Diagnosis Date  . Bone metastasis ([place])   . Hemoptysis   . Hypokalemia   . lung ca dx'd 07/2014  .  Lung mass   . Metastasis to adrenal gland ([place])   . Metastasis to brain ([place])   . Pneumonia   . Radiation 08/23/14-09/07/14   Brain/chest and left hip 30 Gy 12 Fx  . URI (upper respiratory infection) 02-02-2015    Past Surgical History:  Procedure Laterality Date  . IR GENERIC HISTORICAL  07/14/2015   IR RADIOLOGIST EVAL & MGMT 07/14/2015 Greggory Keen, MD [facility] INTERV RAD  .  VIDEO BRONCHOSCOPY Bilateral 08/09/2014   Procedure: VIDEO BRONCHOSCOPY WITHOUT FLUORO;  Surgeon: [name], MD;  Location: [facility] ENDOSCOPY;  Service: Cardiopulmonary;  Laterality: Bilateral;  . VIDEO BRONCHOSCOPY Bilateral 07/06/2015   Procedure: VIDEO BRONCHOSCOPY WITHOUT FLUORO;  Surgeon: [name], MD;  Location: [facility] ENDOSCOPY;  Service: Cardiopulmonary;  Laterality: Bilateral;    Family History  Problem Relation Age of Onset  . Hyperlipidemia Mother     Social History   Socioeconomic History  . Marital status: Married    Spouse name: Not on file  . Number of children: 3  . Years of education: Not on file  . Highest education level: Not on file  Occupational History  . Occupation: unemployed  Tobacco Use  . Smoking status: Never Smoker  . Smokeless tobacco: Never Used  Vaping Use  . Vaping Use: Never used  Substance and Sexual Activity  . Alcohol use: No    Alcohol/week: 0.0 standard drinks  . Drug use: No  . Sexual activity: Not Currently  Other Topics Concern  . Not on file  Social History Narrative   Patient moved to [place] in [place].   Had been in [place] since that time.   Social Determinants of Health   Financial Resource Strain:   . Difficulty of Paying Living Expenses: Not on file  Food Insecurity:   . Worried About Charity fundraiser in the Last Year: Not on file  . Ran Out of Food in the Last Year: Not on file  Transportation Needs:   . Lack of Transportation (Medical): Not on file  . Lack of Transportation (Non-Medical): Not on file  Physical Activity:   . Days of Exercise per Week: Not on file  . Minutes of Exercise per Session: Not on file  Stress:   . Feeling of Stress : Not on file  Social Connections:   . Frequency of Communication with Friends and Family: Not on file  . Frequency of Social Gatherings with Friends and Family: Not on file  . Attends Religious Services: Not on file  . Active Member of Clubs or  Organizations: Not on file  . Attends Archivist Meetings: Not on file  . Marital Status: Not on file  Intimate Partner Violence:   . Fear of Current or Ex-Partner: Not on file  . Emotionally Abused: Not on file  . Physically Abused: Not on file  . Sexually Abused: Not on file    Past Medical History, Surgical history, Social history, and Family history were reviewed and updated as appropriate.   Please see review of systems for further details on the patient's review from today.   Review of Systems:  Review of Systems  Constitutional: Positive for fatigue. Negative for appetite change, chills, diaphoresis, fever and unexpected weight change.  HENT: Negative for trouble swallowing and voice change.   Respiratory: Negative for cough, chest tightness, shortness of breath and wheezing.   Cardiovascular: Negative for chest pain and palpitations.  Gastrointestinal: Positive for constipation. Negative for abdominal distention, abdominal pain,  anal bleeding, blood in stool, diarrhea, nausea, rectal pain and vomiting.  Musculoskeletal: Positive for back pain. Negative for myalgias.  Neurological: Positive for weakness. Negative for dizziness, light-headedness and headaches.    Objective:   Physical Exam:  BP 120/75 (BP Location: Left Arm, Patient Position: Sitting)   Pulse (!) 112   Temp 98.8 F (37.1 C) (Tympanic)   Resp 16   Ht 4\' 11"  (1.499 m)   Wt 82 lb 11.2 oz (37.5 kg)   SpO2 100%   BMI 16.70 kg/m  ECOG: 1  Physical Exam Constitutional:      General: She is not in acute distress.    Appearance: She is not diaphoretic.  HENT:     Head: Normocephalic and atraumatic.  Eyes:     General: No scleral icterus.       Right eye: No discharge.        Left eye: No discharge.     Conjunctiva/sclera: Conjunctivae normal.  Cardiovascular:     Rate and Rhythm: Normal rate and regular rhythm.     Heart sounds: Normal heart sounds. No murmur heard.  No friction rub. No  gallop.   Pulmonary:     Effort: Pulmonary effort is normal. No respiratory distress.     Breath sounds: Normal breath sounds. No wheezing or rales.  Abdominal:     General: Bowel sounds are normal. There is no distension.     Tenderness: There is no abdominal tenderness. There is no guarding.  Skin:    General: Skin is warm and dry.     Findings: No erythema or rash.  Neurological:     Mental Status: She is alert.     Coordination: Coordination normal.     Gait: Gait normal.     Lab Review:     Component Value Date/Time   NA 127 (L) 11/30/2019 0850   NA 140 01/17/2017 1003   K 4.2 11/30/2019 0850   K 3.9 01/17/2017 1003   CL 91 (L) 11/30/2019 0850   CO2 28 11/30/2019 0850   CO2 29 01/17/2017 1003   GLUCOSE 127 (H) 11/30/2019 0850   GLUCOSE 69 (L) 01/17/2017 1003   BUN 11 11/30/2019 0850   BUN 14.8 01/17/2017 1003   CREATININE 0.70 11/30/2019 0850   CREATININE 0.8 01/17/2017 1003   CALCIUM 8.9 11/30/2019 0850   CALCIUM 8.7 01/17/2017 1003   PROT 7.3 11/30/2019 0850   PROT 6.4 01/17/2017 1003   ALBUMIN 3.2 (L) 11/30/2019 0850   ALBUMIN 3.6 01/17/2017 1003   AST 33 11/30/2019 0850   AST 23 01/17/2017 1003   ALT 41 11/30/2019 0850   ALT 27 01/17/2017 1003   ALKPHOS 99 11/30/2019 0850   ALKPHOS 50 01/17/2017 1003   BILITOT 0.4 11/30/2019 0850   BILITOT 0.46 01/17/2017 1003   GFRNONAA >60 11/30/2019 0850   GFRAA >60 10/09/2019 0840       Component Value Date/Time   WBC 7.2 11/30/2019 0850   WBC 8.4 11/27/2019 0504   RBC 3.65 (L) 11/30/2019 0850   HGB 10.9 (L) 11/30/2019 0850   HGB 12.9 01/17/2017 1003   HCT 32.2 (L) 11/30/2019 0850   HCT 39.6 01/17/2017 1003   PLT 272 11/30/2019 0850   PLT 258 01/17/2017 1003   MCV 88.2 11/30/2019 0850   MCV 95.7 01/17/2017 1003   MCH 29.9 11/30/2019 0850   MCHC 33.9 11/30/2019 0850   RDW 14.1 11/30/2019 0850   RDW 13.0 01/17/2017 1003   LYMPHSABS 0.7 11/30/2019  0850   LYMPHSABS 1.4 01/17/2017 1003   MONOABS 1.1 (H)  11/30/2019 0850   MONOABS 0.6 01/17/2017 1003   EOSABS 0.0 11/30/2019 0850   EOSABS 0.1 01/17/2017 1003   BASOSABS 0.0 11/30/2019 0850   BASOSABS 0.0 01/17/2017 1003   -------------------------------  Imaging from last 24 hours (if applicable):  Radiology interpretation: CT Angio Chest PE W and/or Wo Contrast  Result Date: 11/07/2019 CLINICAL DATA:  Metastatic lung cancer with right-sided abdominal pain EXAM: CT ANGIOGRAPHY CHEST CT ABDOMEN AND PELVIS WITH CONTRAST TECHNIQUE: Multidetector CT imaging of the chest was performed using the standard protocol during bolus administration of intravenous contrast. Multiplanar CT image reconstructions and MIPs were obtained to evaluate the vascular anatomy. Multidetector CT imaging of the abdomen and pelvis was performed using the standard protocol during bolus administration of intravenous contrast. CONTRAST:  138mL OMNIPAQUE IOHEXOL 350 MG/ML SOLN COMPARISON:  10/09/2019 FINDINGS: CTA CHEST FINDINGS Cardiovascular: Normal heart size with no pericardial effusion. Large main pulmonary artery with presumed pulmonary hypertension. Asymmetric poor opacification of right lower lobe pulmonary arteries, likely hypoxic mediated shunting due to the degree of scarring. No pulmonary emboli are seen. Mediastinum/Nodes: No lymphadenopathy. Lungs/Pleura: Radiation fibrosis appearance to the right upper, right middle, and right lower lobes with traction bronchiectasis and architectural distortion, unchanged. Trace pleural fluid on the right. Clear left lung. Musculoskeletal: Area of sclerosis to the lateral right third rib and medial right clavicle, stable and possibly radiation osteitis or metastatic disease, stable. No acute fracture. Review of the MIP images confirms the above findings. CT ABDOMEN and PELVIS FINDINGS Hepatobiliary: Band of hypointensity in the inferior right lobe liver, given constellation of findings suspect radiation changes. Mild chronic intra and  extrahepatic bile duct dilatation. Small cystic densities in the subcapsular liver. Pancreas: Patchy increased enhancement in the pancreatic body which is mildly expanded. Spleen: Small spleen without focal abnormality Adrenals/Urinary Tract: Negative adrenals. Best seen on coronal reformats there is hypoenhancement in the medullary spaces of the upper pole kidneys where there is also less cortical thickness. Prominent urothelial thickness along the bilateral upper ureters, similar to prior. Unremarkable bladder. Stomach/Bowel: No definite bowel inflammation and no bowel obstruction. Vascular/Lymphatic: Atheromatous plaque with stable strandy density around the aorta. No mass or adenopathy. Reproductive:No pathologic findings. Other: Small volume pelvic ascites considered reactive. Musculoskeletal: Osseous metastatic disease with most confluent sclerosis in left acetabulum where there is expansion. No soft tissue tumor extension or pathologic fracture is seen to explain the history. Review of the MIP images confirms the above findings. IMPRESSION: Chest CTA: 1. No evidence of pulmonary embolism. 2. Stable radiation changes in the right lung with trace pleural fluid. Abdominal CT: 1. Band of abnormality affecting the liver, pancreas, and upper pole kidneys likely related to adrenal radiation (if administered). Please correlate with labs for pancreatitis. 2. Known osseous metastatic disease without acute fracture. 3. Prominent bilateral upper ureteral thickness, stable and without hydronephrosis. Electronically Signed   By: Monte Fantasia M.D.   On: 11/07/2019 10:38   CT ABDOMEN PELVIS W CONTRAST  Result Date: 11/21/2019 CLINICAL DATA:  Known lung cancer, on oral chemotherapy, emesis since since yesterday EXAM: CT ABDOMEN AND PELVIS WITH CONTRAST TECHNIQUE: Multidetector CT imaging of the abdomen and pelvis was performed using the standard protocol following bolus administration of intravenous contrast. CONTRAST:   4mL OMNIPAQUE IOHEXOL 300 MG/ML  SOLN COMPARISON:  CT chest, abdomen and pelvis 11/07/2019 FINDINGS: Lower chest: Fairly consolidated region of lung in the medial right lung base with some  features of post treatment related effect and fibrosis albeit with several fluid-filled airways in the non aerated portion of the lung. Small volume right pleural fluid is noted as well. Left base is clear. Stable sclerotic focus in lateral right sixth rib. Normal heart size. No pericardial effusion. Trace pericardial fluid is unchanged from prior. Hepatobiliary: Redemonstrated bandlike region of parenchymal hypoattenuation extending throughout the right lobe liver. Several scattered subcentimeter hypoattenuating foci are present throughout the liver, also unchanged from priors. Some mild intra and extrahepatic biliary ductal dilatation is similar to comparison studies without visible intraductal gallstones or other filling defect evident. Gallbladder Lea partially distended without significant wall thickening but with some fluid localized to the porta hepatis gallbladder fossa. Pancreas: Redemonstrated patchy enhancement of the pancreas with a slightly lobular, thickened appearance albeit without focal lesion or pancreatic ductal dilatation. Some surrounding stranding seen in the adjacent retroperitoneum. Spleen: Diminutive spleen. No focal lesion. Small volume perisplenic free fluid seen posteriorly. Adrenals/Urinary Tract: Normal adrenal glands. Increasingly conspicuous hypoattenuation of the upper poles and medial kidneys bilaterally. Additionally, there is some mild bilateral urothelial thickening, left slightly greater than right. No focal liver lesion is seen. No urolithiasis. Urinary bladder is largely decompressed at the time of exam and therefore poorly evaluated by CT imaging. Some mild bladder wall thickening is nonspecific. Stomach/Bowel: Distal esophagus is unremarkable. Some thickening of the gastric antrum likely  related to normal peristalsis. Some slight mucosal hyperemia throughout the first portion of the duodenum is nonspecific and similar to the comparison exam. Some adjacent inflammatory changes may be redistributed. No other small bowel thickening or conspicuous enhancement. No dilatation or evidence of obstruction. A normal appendix is visualized. No colonic dilatation or wall thickening. Vascular/Lymphatic: Atherosclerotic calcifications within the abdominal aorta and branch vessels. No aneurysm or ectasia. No enlarged abdominopelvic lymph nodes. Some reactive appearing and possibly edematous nodes in the upper abdomen. Reproductive: Slight prominence of the left parametrial and gonadal vessels. Otherwise normal appearance of the anteverted uterus without other concerning adnexal abnormality. Other: Increasing upper abdominal edematous changes predominantly centered in the central mesentery and retroperitoneum. No free air. No bowel containing hernia. Low-attenuation ascites similar to only minimally increased in volume from prior with some redistribution including to the subphrenic spaces. Musculoskeletal: Redemonstration of the diffuse osseous metastatic disease including sclerotic foci in the spine and more expansile lytic and sclerotic appearance of the left ilium and acetabulum. IMPRESSION: 1. Hypoattenuation of the upper poles and medial kidneys bilaterally with some mild bilateral urothelial thickening, left slightly greater than right. Could be related to radiation related effects though could correlate with urinalysis to exclude urinary tract infection. 2. Redemonstrated patchy enhancement of the pancreas with a slightly lobular, thickened appearance albeit without focal lesion or pancreatic ductal dilatation. Some surrounding stranding seen in the adjacent retroperitoneum. While this could reflect radiation related changes a pancreatitis could present similarly. Correlate with lipase. 3. Slight mucosal  hyperemia throughout the first portion of the duodenum is nonspecific and similar to the comparison exam. Possibly within a radiation field as well with additional inflammatory changes in the upper abdomen and retroperitoneum. 4. Low-attenuation ascites similar to only minimally increased in volume from prior with some redistribution including to the subphrenic spaces. 5. Fairly consolidated region of lung in the medial right lung base with some features of post treatment related effect and fibrosis albeit with several fluid-filled airways in the non aerated portion of the lung. Findings are favored to reflect post treatment related effect though superimposed aspiration  may be present. 6. Slight prominence of the left parametrial and gonadal vessels, which can be seen with pelvic congestion syndrome in the appropriate clinical setting. 7. Aortic Atherosclerosis (ICD10-I70.0). Electronically Signed   By: Lovena Wailes M.D.   On: 11/21/2019 02:00   CT ABDOMEN PELVIS W CONTRAST  Result Date: 11/07/2019 CLINICAL DATA:  Metastatic lung cancer with right-sided abdominal pain EXAM: CT ANGIOGRAPHY CHEST CT ABDOMEN AND PELVIS WITH CONTRAST TECHNIQUE: Multidetector CT imaging of the chest was performed using the standard protocol during bolus administration of intravenous contrast. Multiplanar CT image reconstructions and MIPs were obtained to evaluate the vascular anatomy. Multidetector CT imaging of the abdomen and pelvis was performed using the standard protocol during bolus administration of intravenous contrast. CONTRAST:  120mL OMNIPAQUE IOHEXOL 350 MG/ML SOLN COMPARISON:  10/09/2019 FINDINGS: CTA CHEST FINDINGS Cardiovascular: Normal heart size with no pericardial effusion. Large main pulmonary artery with presumed pulmonary hypertension. Asymmetric poor opacification of right lower lobe pulmonary arteries, likely hypoxic mediated shunting due to the degree of scarring. No pulmonary emboli are seen.  Mediastinum/Nodes: No lymphadenopathy. Lungs/Pleura: Radiation fibrosis appearance to the right upper, right middle, and right lower lobes with traction bronchiectasis and architectural distortion, unchanged. Trace pleural fluid on the right. Clear left lung. Musculoskeletal: Area of sclerosis to the lateral right third rib and medial right clavicle, stable and possibly radiation osteitis or metastatic disease, stable. No acute fracture. Review of the MIP images confirms the above findings. CT ABDOMEN and PELVIS FINDINGS Hepatobiliary: Band of hypointensity in the inferior right lobe liver, given constellation of findings suspect radiation changes. Mild chronic intra and extrahepatic bile duct dilatation. Small cystic densities in the subcapsular liver. Pancreas: Patchy increased enhancement in the pancreatic body which is mildly expanded. Spleen: Small spleen without focal abnormality Adrenals/Urinary Tract: Negative adrenals. Best seen on coronal reformats there is hypoenhancement in the medullary spaces of the upper pole kidneys where there is also less cortical thickness. Prominent urothelial thickness along the bilateral upper ureters, similar to prior. Unremarkable bladder. Stomach/Bowel: No definite bowel inflammation and no bowel obstruction. Vascular/Lymphatic: Atheromatous plaque with stable strandy density around the aorta. No mass or adenopathy. Reproductive:No pathologic findings. Other: Small volume pelvic ascites considered reactive. Musculoskeletal: Osseous metastatic disease with most confluent sclerosis in left acetabulum where there is expansion. No soft tissue tumor extension or pathologic fracture is seen to explain the history. Review of the MIP images confirms the above findings. IMPRESSION: Chest CTA: 1. No evidence of pulmonary embolism. 2. Stable radiation changes in the right lung with trace pleural fluid. Abdominal CT: 1. Band of abnormality affecting the liver, pancreas, and upper pole  kidneys likely related to adrenal radiation (if administered). Please correlate with labs for pancreatitis. 2. Known osseous metastatic disease without acute fracture. 3. Prominent bilateral upper ureteral thickness, stable and without hydronephrosis. Electronically Signed   By: Monte Fantasia M.D.   On: 11/07/2019 10:38   DG Chest Port 1 View  Result Date: 11/07/2019 CLINICAL DATA:  Questionable sepsis EXAM: PORTABLE CHEST 1 VIEW COMPARISON:  08/03/2019 FINDINGS: Asymmetric volume loss and opacity on the right where there is radiation fibrosis by recent CT. The left chest is clear. Normal heart size and stable mediastinal contours. No visible effusion or pneumothorax. IMPRESSION: Stable post treatment changes on the right. Electronically Signed   By: Monte Fantasia M.D.   On: 11/07/2019 07:58        This case was discussed with Dr. Julien Nordmann. He expressed agreement with my management of  this patient.  She was seen with the aid of an interpreter.

## 2019-12-08 ENCOUNTER — Telehealth: Payer: Self-pay | Admitting: Radiation Oncology

## 2019-12-08 ENCOUNTER — Other Ambulatory Visit: Payer: Medicare Other

## 2019-12-08 ENCOUNTER — Inpatient Hospital Stay (HOSPITAL_BASED_OUTPATIENT_CLINIC_OR_DEPARTMENT_OTHER): Payer: Medicare Other | Admitting: Internal Medicine

## 2019-12-08 ENCOUNTER — Ambulatory Visit: Payer: Medicare Other | Admitting: Internal Medicine

## 2019-12-08 ENCOUNTER — Encounter: Payer: Self-pay | Admitting: Internal Medicine

## 2019-12-08 ENCOUNTER — Inpatient Hospital Stay: Payer: Medicare Other

## 2019-12-08 ENCOUNTER — Encounter: Payer: Self-pay | Admitting: *Deleted

## 2019-12-08 ENCOUNTER — Other Ambulatory Visit: Payer: Self-pay

## 2019-12-08 ENCOUNTER — Other Ambulatory Visit: Payer: Self-pay | Admitting: Medical Oncology

## 2019-12-08 VITALS — BP 108/66 | HR 120 | Temp 97.5°F | Resp 16 | Ht 59.0 in | Wt 80.3 lb

## 2019-12-08 DIAGNOSIS — M898X9 Other specified disorders of bone, unspecified site: Secondary | ICD-10-CM

## 2019-12-08 DIAGNOSIS — C3490 Malignant neoplasm of unspecified part of unspecified bronchus or lung: Secondary | ICD-10-CM

## 2019-12-08 DIAGNOSIS — E860 Dehydration: Secondary | ICD-10-CM

## 2019-12-08 DIAGNOSIS — C3491 Malignant neoplasm of unspecified part of right bronchus or lung: Secondary | ICD-10-CM

## 2019-12-08 DIAGNOSIS — R1013 Epigastric pain: Secondary | ICD-10-CM

## 2019-12-08 DIAGNOSIS — C3431 Malignant neoplasm of lower lobe, right bronchus or lung: Secondary | ICD-10-CM | POA: Diagnosis not present

## 2019-12-08 DIAGNOSIS — C7951 Secondary malignant neoplasm of bone: Secondary | ICD-10-CM

## 2019-12-08 DIAGNOSIS — E889 Metabolic disorder, unspecified: Secondary | ICD-10-CM

## 2019-12-08 DIAGNOSIS — M9080 Osteopathy in diseases classified elsewhere, unspecified site: Secondary | ICD-10-CM

## 2019-12-08 DIAGNOSIS — Z5111 Encounter for antineoplastic chemotherapy: Secondary | ICD-10-CM

## 2019-12-08 LAB — CBC WITH DIFFERENTIAL (CANCER CENTER ONLY)
Abs Immature Granulocytes: 0.08 10*3/uL — ABNORMAL HIGH (ref 0.00–0.07)
Basophils Absolute: 0 10*3/uL (ref 0.0–0.1)
Basophils Relative: 0 %
Eosinophils Absolute: 0 10*3/uL (ref 0.0–0.5)
Eosinophils Relative: 0 %
HCT: 31.3 % — ABNORMAL LOW (ref 36.0–46.0)
Hemoglobin: 11 g/dL — ABNORMAL LOW (ref 12.0–15.0)
Immature Granulocytes: 1 %
Lymphocytes Relative: 5 %
Lymphs Abs: 0.5 10*3/uL — ABNORMAL LOW (ref 0.7–4.0)
MCH: 30.3 pg (ref 26.0–34.0)
MCHC: 35.1 g/dL (ref 30.0–36.0)
MCV: 86.2 fL (ref 80.0–100.0)
Monocytes Absolute: 0.8 10*3/uL (ref 0.1–1.0)
Monocytes Relative: 8 %
Neutro Abs: 7.6 10*3/uL (ref 1.7–7.7)
Neutrophils Relative %: 86 %
Platelet Count: 181 10*3/uL (ref 150–400)
RBC: 3.63 MIL/uL — ABNORMAL LOW (ref 3.87–5.11)
RDW: 14.5 % (ref 11.5–15.5)
WBC Count: 8.9 10*3/uL (ref 4.0–10.5)
nRBC: 0 % (ref 0.0–0.2)

## 2019-12-08 LAB — CMP (CANCER CENTER ONLY)
ALT: 15 U/L (ref 0–44)
AST: 12 U/L — ABNORMAL LOW (ref 15–41)
Albumin: 3.1 g/dL — ABNORMAL LOW (ref 3.5–5.0)
Alkaline Phosphatase: 90 U/L (ref 38–126)
Anion gap: 13 (ref 5–15)
BUN: 13 mg/dL (ref 6–20)
CO2: 25 mmol/L (ref 22–32)
Calcium: 9 mg/dL (ref 8.9–10.3)
Chloride: 87 mmol/L — ABNORMAL LOW (ref 98–111)
Creatinine: 0.7 mg/dL (ref 0.44–1.00)
GFR, Estimated: >60 mL/min (ref >60)
Glucose, Bld: 123 mg/dL — ABNORMAL HIGH (ref 70–99)
Potassium: 3.7 mmol/L (ref 3.5–5.1)
Sodium: 125 mmol/L — ABNORMAL LOW (ref 135–145)
Total Bilirubin: 0.5 mg/dL (ref 0.3–1.2)
Total Protein: 6.8 g/dL (ref 6.5–8.1)

## 2019-12-08 MED ORDER — SODIUM CHLORIDE 0.9 % IV SOLN
INTRAVENOUS | Status: DC
  Filled 2019-12-08 (×2): qty 250

## 2019-12-08 MED ORDER — MORPHINE SULFATE ER 30 MG PO TBCR
30.0000 mg | EXTENDED_RELEASE_TABLET | Freq: Two times a day (BID) | ORAL | 0 refills | Status: DC
Start: 2019-12-08 — End: 2019-12-08

## 2019-12-08 MED ORDER — SODIUM CHLORIDE 0.9 % IV SOLN
INTRAVENOUS | Status: AC
  Filled 2019-12-08: qty 250

## 2019-12-08 MED FILL — MORPHINE SULF ER 30 MG TAB: 30 | 30 days supply | Qty: 60 | Fill #0

## 2019-12-08 NOTE — Patient Instructions (Signed)

## 2019-12-08 NOTE — Telephone Encounter (Signed)
Rec'd referral from Dr. Julien Nordmann to schedule a reconsult appt with Deborah Simpson, PA. Tried twice to call pt and leave a voicemail, but it says that her mailbox is full.

## 2019-12-08 NOTE — Telephone Encounter (Signed)
Called and spoke w/Jessica in interpreter line. She will set up interpreter for 12/8 consult.

## 2019-12-08 NOTE — Progress Notes (Signed)
I spoke with [name] and interpreter today.  She is having pain issues that Dr. Julien Nordmann addressed.  No other barriers identified at this time.

## 2019-12-08 NOTE — Progress Notes (Signed)
Pt discharged in no apparent distress. Pt left ambulatory without assistance. Pt aware of discharge instructions and verbalized understanding and had no further questions.  

## 2019-12-08 NOTE — Progress Notes (Signed)
[place] [phone]   [phone]  OFFICE PROGRESS NOTE  [name], MD [place] [address]  DIAGNOSIS:  1) Stage IV (T2a, N1, M1b) non-small cell lung cancer, adenocarcinoma with positive EGFR mutation (deletion 20) presented with right middle lobe lung mass, right hilar adenopathy as well as metastatic disease to the bone, brain and left adrenal diagnosed in July 2016. 2) deep venous thrombosis of the left lower extremity diagnosed in March 2020  PRIOR THERAPY: 1) palliative radiotherapy to the brain as well as metastatic bone lesions in the pelvis. 2) Tarceva 150 mg by mouth daily started on 09/25/2014. Status post 5 months of treatment. 3) status post palliative radiotherapy to the right middle lobe lung mass for treatment of hemoptysis. 4) Tarceva 100 mg by mouth daily started 02/19/2015 status post 13 months of treatment. This was discontinued in May 2018 after the patient had disease progression and development of T790M resistant mutation.  5) palliative stereotactic radiotherapy to the left adrenal gland metastatic lesion under the care of Dr. Lisbeth Renshaw completed on April 10, 2017.  CURRENT THERAPY: 1) Tagrisso 80 mg by mouth daily started 06/02/2016.  Status post 42 months of treatment. 2) Xgeva 120 g subcutaneously every 6 weeks. 3) Xalkori 250 mg p.o. twice daily started 05/08/2018.  This was a change it to once daily in July 2020 secondary intolerant intolerance.  It is currently on hold because of intolerance.  INTERVAL HISTORY: Deborah Foster 49 y.o. female returns to the clinic today for follow-up visit accompanied by her interpreter.  The patient has a lot of vague complaints recently including pain on the right shoulder as well as left hip and intermittent abdominal pain.  She was admitted to [facility] recently with similar problem and she had CT angiogram of the chest as well as CT scan of the  abdomen and pelvis that were unremarkable except for mild pancreatitis that could be related to her previous radiotherapy to the adrenal glands.  The patient has no current nausea, vomiting but has few episodes of diarrhea with no constipation.  She denied having any chest pain, shortness of breath, cough or hemoptysis.  She denied having any headache or visual changes.  She is currently on Dilaudid for pain management.  She is here today for evaluation and recommendation regarding her condition.  MEDICAL HISTORY: Past Medical History:  Diagnosis Date  . Bone metastasis ([place])   . Hemoptysis   . Hypokalemia   . lung ca dx'd 07/2014  . Lung mass   . Metastasis to adrenal gland ([place])   . Metastasis to brain ([place])   . Pneumonia   . Radiation 08/23/14-09/07/14   Brain/chest and left hip 30 Gy 12 Fx  . URI (upper respiratory infection) 02-02-2015    ALLERGIES:  is allergic to other, tramadol, and doxycycline.  MEDICATIONS:  Current Outpatient Medications  Medication Sig Dispense Refill  . calcium-vitamin D (OSCAL WITH D) 500-200 MG-UNIT tablet Take 1 tablet by mouth 3 (three) times daily.    . Cyanocobalamin (VITAMIN B-12) 2500 MCG SUBL Place 2,500 mcg under the tongue daily.    Marland Kitchen dexamethasone (DECADRON) 4 MG tablet Take 1 tablet (4 mg total) by mouth every 12 (twelve) hours. 28 tablet 1  . diclofenac Sodium (VOLTAREN) 1 % GEL Apply 2 g topically 4 (four) times daily. 2 g 1  . gabapentin (NEURONTIN) 300 MG capsule Take 2 capsules (600 mg total)  by mouth 2 (two) times daily. 120 capsule 1  . HYDROmorphone (DILAUDID) 4 MG tablet Take 1 tablet (4 mg total) by mouth every 6 (six) hours as needed for severe pain. 56 tablet 0  . lactulose (CHRONULAC) 10 GM/15ML solution Take 30 mLs (20 g total) by mouth 2 (two) times daily as needed for moderate constipation. 1892 mL 2  . levETIRAcetam (KEPPRA) 250 MG tablet Take 1 tablet (250 mg total) by mouth 2 (two) times daily. 60 tablet 5  . loratadine  (CLARITIN) 10 MG tablet Take 1 tablet (10 mg total) by mouth daily. 30 tablet 0  . ondansetron (ZOFRAN) 8 MG tablet Take 8 mg by mouth every 8 (eight) hours as needed for nausea or vomiting.     . pantoprazole (PROTONIX) 40 MG tablet Take 40 mg by mouth daily.    . polyethylene glycol (MIRALAX / GLYCOLAX) 17 g packet Take 17 g by mouth 2 (two) times daily. 72 each 1  . senna-docusate (SENOKOT-S) 8.6-50 MG tablet Take 1 tablet by mouth 2 (two) times daily. 60 tablet 0  . TAGRISSO 80 MG tablet TAKE 1 TABLET (80 MG TOTAL) BY MOUTH DAILY. (Patient taking differently: Take 80 mg by mouth daily. ) 30 tablet 11   No current facility-administered medications for this visit.   Facility-Administered Medications Ordered in Other Visits  Medication Dose Route Frequency Provider Last Rate Last Admin  . denosumab (XGEVA) injection 120 mg  120 mg Subcutaneous Once Mohamed, Mohamed, MD        SURGICAL HISTORY:  Past Surgical History:  Procedure Laterality Date  . IR GENERIC HISTORICAL  07/14/2015   IR RADIOLOGIST EVAL & MGMT 07/14/2015 Michael Shick, MD [facility] INTERV RAD  . VIDEO BRONCHOSCOPY Bilateral 08/09/2014   Procedure: VIDEO BRONCHOSCOPY WITHOUT FLUORO;  Surgeon: Douglas B McQuaid, MD;  Location: [facility] ENDOSCOPY;  Service: Cardiopulmonary;  Laterality: Bilateral;  . VIDEO BRONCHOSCOPY Bilateral 07/06/2015   Procedure: VIDEO BRONCHOSCOPY WITHOUT FLUORO;  Surgeon: Douglas B McQuaid, MD;  Location: [facility] ENDOSCOPY;  Service: Cardiopulmonary;  Laterality: Bilateral;    REVIEW OF SYSTEMS:  Constitutional: positive for anorexia, fatigue and weight loss Eyes: negative Ears, nose, mouth, throat, and face: negative Respiratory: negative Cardiovascular: negative Gastrointestinal: positive for abdominal pain and constipation Genitourinary:negative Integument/breast: negative Hematologic/lymphatic: negative Musculoskeletal:positive for bone pain Neurological: negative Behavioral/Psych: negative Endocrine:  negative Allergic/Immunologic: negative   PHYSICAL EXAMINATION: General appearance: alert, cooperative, fatigued and no distress Head: Normocephalic, without obvious abnormality, atraumatic Neck: no adenopathy, no JVD, supple, symmetrical, trachea midline and thyroid not enlarged, symmetric, no tenderness/mass/nodules Lymph nodes: Cervical, supraclavicular, and axillary nodes normal. Resp: clear to auscultation bilaterally Back: symmetric, no curvature. ROM normal. No CVA tenderness. Cardio: regular rate and rhythm, S1, S2 normal, no murmur, click, rub or gallop GI: soft, non-tender; bowel sounds normal; no masses,  no organomegaly Extremities: extremities normal, atraumatic, no cyanosis or edema Neurologic: Alert and oriented X 3, normal strength and tone. Normal symmetric reflexes. Normal coordination and gait  ECOG PERFORMANCE STATUS: 1 - Symptomatic but completely ambulatory  Blood pressure 108/66, pulse (!) 120, temperature (!) 97.5 F (36.4 C), temperature source Tympanic, resp. rate 16, height 4' 11" (1.499 m), weight 80 lb 4.8 oz (36.4 kg), SpO2 100 %.  LABORATORY DATA: Lab Results  Component Value Date   WBC 8.9 12/08/2019   HGB 11.0 (L) 12/08/2019   HCT 31.3 (L) 12/08/2019   MCV 86.2 12/08/2019   PLT 181 12/08/2019      Chemistry        Component Value Date/Time   NA 127 (L) 11/30/2019 0850   NA 140 01/17/2017 1003   K 4.2 11/30/2019 0850   K 3.9 01/17/2017 1003   CL 91 (L) 11/30/2019 0850   CO2 28 11/30/2019 0850   CO2 29 01/17/2017 1003   BUN 11 11/30/2019 0850   BUN 14.8 01/17/2017 1003   CREATININE 0.70 11/30/2019 0850   CREATININE 0.8 01/17/2017 1003      Component Value Date/Time   CALCIUM 8.9 11/30/2019 0850   CALCIUM 8.7 01/17/2017 1003   ALKPHOS 99 11/30/2019 0850   ALKPHOS 50 01/17/2017 1003   AST 33 11/30/2019 0850   AST 23 01/17/2017 1003   ALT 41 11/30/2019 0850   ALT 27 01/17/2017 1003   BILITOT 0.4 11/30/2019 0850   BILITOT 0.46 01/17/2017  1003       RADIOGRAPHIC STUDIES: CT ABDOMEN PELVIS W CONTRAST  Result Date: 11/21/2019 CLINICAL DATA:  Known lung cancer, on oral chemotherapy, emesis since since yesterday EXAM: CT ABDOMEN AND PELVIS WITH CONTRAST TECHNIQUE: Multidetector CT imaging of the abdomen and pelvis was performed using the standard protocol following bolus administration of intravenous contrast. CONTRAST:  75mL OMNIPAQUE IOHEXOL 300 MG/ML  SOLN COMPARISON:  CT chest, abdomen and pelvis 11/07/2019 FINDINGS: Lower chest: Fairly consolidated region of lung in the medial right lung base with some features of post treatment related effect and fibrosis albeit with several fluid-filled airways in the non aerated portion of the lung. Small volume right pleural fluid is noted as well. Left base is clear. Stable sclerotic focus in lateral right sixth rib. Normal heart size. No pericardial effusion. Trace pericardial fluid is unchanged from prior. Hepatobiliary: Redemonstrated bandlike region of parenchymal hypoattenuation extending throughout the right lobe liver. Several scattered subcentimeter hypoattenuating foci are present throughout the liver, also unchanged from priors. Some mild intra and extrahepatic biliary ductal dilatation is similar to comparison studies without visible intraductal gallstones or other filling defect evident. Gallbladder Lea partially distended without significant wall thickening but with some fluid localized to the porta hepatis gallbladder fossa. Pancreas: Redemonstrated patchy enhancement of the pancreas with a slightly lobular, thickened appearance albeit without focal lesion or pancreatic ductal dilatation. Some surrounding stranding seen in the adjacent retroperitoneum. Spleen: Diminutive spleen. No focal lesion. Small volume perisplenic free fluid seen posteriorly. Adrenals/Urinary Tract: Normal adrenal glands. Increasingly conspicuous hypoattenuation of the upper poles and medial kidneys bilaterally.  Additionally, there is some mild bilateral urothelial thickening, left slightly greater than right. No focal liver lesion is seen. No urolithiasis. Urinary bladder is largely decompressed at the time of exam and therefore poorly evaluated by CT imaging. Some mild bladder wall thickening is nonspecific. Stomach/Bowel: Distal esophagus is unremarkable. Some thickening of the gastric antrum likely related to normal peristalsis. Some slight mucosal hyperemia throughout the first portion of the duodenum is nonspecific and similar to the comparison exam. Some adjacent inflammatory changes may be redistributed. No other small bowel thickening or conspicuous enhancement. No dilatation or evidence of obstruction. A normal appendix is visualized. No colonic dilatation or wall thickening. Vascular/Lymphatic: Atherosclerotic calcifications within the abdominal aorta and branch vessels. No aneurysm or ectasia. No enlarged abdominopelvic lymph nodes. Some reactive appearing and possibly edematous nodes in the upper abdomen. Reproductive: Slight prominence of the left parametrial and gonadal vessels. Otherwise normal appearance of the anteverted uterus without other concerning adnexal abnormality. Other: Increasing upper abdominal edematous changes predominantly centered in the central mesentery and retroperitoneum. No free air. No bowel containing hernia. Low-attenuation ascites   similar to only minimally increased in volume from prior with some redistribution including to the subphrenic spaces. Musculoskeletal: Redemonstration of the diffuse osseous metastatic disease including sclerotic foci in the spine and more expansile lytic and sclerotic appearance of the left ilium and acetabulum. IMPRESSION: 1. Hypoattenuation of the upper poles and medial kidneys bilaterally with some mild bilateral urothelial thickening, left slightly greater than right. Could be related to radiation related effects though could correlate with  urinalysis to exclude urinary tract infection. 2. Redemonstrated patchy enhancement of the pancreas with a slightly lobular, thickened appearance albeit without focal lesion or pancreatic ductal dilatation. Some surrounding stranding seen in the adjacent retroperitoneum. While this could reflect radiation related changes a pancreatitis could present similarly. Correlate with lipase. 3. Slight mucosal hyperemia throughout the first portion of the duodenum is nonspecific and similar to the comparison exam. Possibly within a radiation field as well with additional inflammatory changes in the upper abdomen and retroperitoneum. 4. Low-attenuation ascites similar to only minimally increased in volume from prior with some redistribution including to the subphrenic spaces. 5. Fairly consolidated region of lung in the medial right lung base with some features of post treatment related effect and fibrosis albeit with several fluid-filled airways in the non aerated portion of the lung. Findings are favored to reflect post treatment related effect though superimposed aspiration may be present. 6. Slight prominence of the left parametrial and gonadal vessels, which can be seen with pelvic congestion syndrome in the appropriate clinical setting. 7. Aortic Atherosclerosis (ICD10-I70.0). Electronically Signed   By: Lovena Kinnison M.D.   On: 11/21/2019 02:00    ASSESSMENT AND PLAN:  This is a very pleasant 49 years old Asian female with stage IV non-small cell lung cancer, adenocarcinoma with positive EGFR mutation with deletion in exon 19 diagnosed in July 2016 status post treatment with Tarceva for a total of 18 months but this was discontinued secondary to disease progression and development of T790M resistant mutation. The patient was started on treatment with Tagrisso 80 mg by mouth daily status post 38 months of treatment. She was also found to have MET amplification on repeat molecular studies. In addition to [place],  the patient was a started on Xalkori 250 mg p.o. twice daily.  She has some side effect of this treatment with increasing fatigue and weakness as well as nausea and vomiting.  Hulda Humphrey was a change it to 250 once daily at nighttime.  She could not tolerate her treatment with Hulda Humphrey and it is currently on hold for the last 79-month. The patient continues to tolerate her treatment with Tagrisso fairly well but she has a lot of other issues recently including the pain in the right shoulder and left hip as well as intermittent abdominal pain, lack of appetite and fatigue. I recommended for her to have MRI of the left hip to rule out any progression of the bone metastasis in that area.  I will refer her to Dr. MLisbeth Renshawfor consideration of palliative radiotherapy to the painful bone lesions. For pain management I will adjust her pain medication and starting her on long-acting MS Contin 30 mg p.o. every 12 hours in addition to Dilaudid for breakthrough pain. For the metastatic bone disease, she will continue her current treatment with Xgeva every 6 weeks. For the lack of appetite and dehydration, I will arrange for the patient to receive 1 L of normal saline in the clinic today. I will see her back for follow-up visit in 1 months for  evaluation and repeat blood work. The patient was advised to call immediately if she has any concerning symptoms in the interval. The patient voices understanding of current disease status and treatment options and is in agreement with the current care plan. All questions were answered. The patient knows to call the clinic with any problems, questions or concerns. We can certainly see the patient much sooner if necessary.  Disclaimer: This note was dictated with voice recognition software. Similar sounding words can inadvertently be transcribed and may not be corrected upon review.

## 2019-12-09 ENCOUNTER — Telehealth: Payer: Self-pay | Admitting: Radiation Oncology

## 2019-12-09 NOTE — Telephone Encounter (Signed)
Patient called wanting to change her recon appt w/Alison Dara Lords, [place] on 12/23/19 to being over the phone due to transportation issues. I've called the interpreter line at [phone] and spoke with Janett Billow. She advised to change the appt notes on the appt desk to say that this will now be a telephone visit and to call the interpreter line at [phone] at the time of the appt.  I've let Blenda Nicely, RN know.

## 2019-12-11 ENCOUNTER — Telehealth: Payer: Self-pay | Admitting: Internal Medicine

## 2019-12-11 NOTE — Telephone Encounter (Signed)
Scheduled per los. Called with interpreter. Left msg. Mailed printout

## 2019-12-16 ENCOUNTER — Other Ambulatory Visit: Payer: Self-pay

## 2019-12-16 ENCOUNTER — Inpatient Hospital Stay: Payer: Medicare Other | Attending: Internal Medicine

## 2019-12-16 VITALS — BP 115/72 | HR 98 | Resp 16

## 2019-12-16 DIAGNOSIS — C7931 Secondary malignant neoplasm of brain: Secondary | ICD-10-CM | POA: Diagnosis not present

## 2019-12-16 DIAGNOSIS — E889 Metabolic disorder, unspecified: Secondary | ICD-10-CM

## 2019-12-16 DIAGNOSIS — Z79899 Other long term (current) drug therapy: Secondary | ICD-10-CM | POA: Insufficient documentation

## 2019-12-16 DIAGNOSIS — C3491 Malignant neoplasm of unspecified part of right bronchus or lung: Secondary | ICD-10-CM | POA: Diagnosis not present

## 2019-12-16 DIAGNOSIS — M898X9 Other specified disorders of bone, unspecified site: Secondary | ICD-10-CM

## 2019-12-16 DIAGNOSIS — C7951 Secondary malignant neoplasm of bone: Secondary | ICD-10-CM | POA: Diagnosis not present

## 2019-12-16 DIAGNOSIS — C3431 Malignant neoplasm of lower lobe, right bronchus or lung: Secondary | ICD-10-CM | POA: Diagnosis present

## 2019-12-16 DIAGNOSIS — C7972 Secondary malignant neoplasm of left adrenal gland: Secondary | ICD-10-CM | POA: Diagnosis not present

## 2019-12-16 MED ORDER — DENOSUMAB 120 MG/1.7ML ~~LOC~~ SOLN
120.0000 mg | Freq: Once | SUBCUTANEOUS | Status: AC
  Administered 2019-12-16: 120 mg via SUBCUTANEOUS

## 2019-12-16 MED ORDER — DENOSUMAB 120 MG/1.7ML ~~LOC~~ SOLN
SUBCUTANEOUS | Status: AC
  Filled 2019-12-16: qty 1.7

## 2019-12-16 MED FILL — GABAPENTIN 300 MG CAPSULE: 300 | 30 days supply | Qty: 120 | Fill #0

## 2019-12-16 NOTE — Progress Notes (Signed)
Ok to use labs from 11/23 for xgeva today

## 2019-12-18 ENCOUNTER — Telehealth: Payer: Self-pay

## 2019-12-18 MED FILL — DEXAMETHASONE 4 MG TABLET: 4 | 14 days supply | Qty: 28 | Fill #1

## 2019-12-18 NOTE — Telephone Encounter (Signed)
Pt LM requesting a refill of Decadron.

## 2019-12-19 NOTE — Telephone Encounter (Signed)
Refill is not appropriate.

## 2019-12-21 NOTE — Telephone Encounter (Signed)
I spoke with pt and advised as indicated.  Pt then shared that she has leg swelling and was seen by her PCP who prescribed Lasix 80mg  but has not seen a change. Pt states she discontinued the Decadron 12/11/19.  Discussed with Dr. Julien Nordmann who advised the leg swelling is likely from the steroid but since pt is no longer taking, she needs to follow-up with the doctor who prescribed the Lasix and let them know she has seen no change.  Though pt speaks [place], I called a [place] interpreter to be sure pt understanding what I'm relaying to her. I connect out call to,Van, interpreter ID: [id number] who relayed Dr. Julien Nordmann is not refilling the rx for Decadron at this time as it is not appropriate. I also asked the pt again if she was still taking decadron and she now indicates she is taking the Decadron. I reiterated to the pt the swelling is likely due to the steroid and she should follow-up with her PCP regarding possibly discontinuing the Lasix. Pt expressed understanding of this information.

## 2019-12-23 ENCOUNTER — Other Ambulatory Visit: Payer: Self-pay

## 2019-12-23 ENCOUNTER — Telehealth: Payer: Self-pay | Admitting: *Deleted

## 2019-12-23 ENCOUNTER — Other Ambulatory Visit: Payer: Self-pay | Admitting: Radiation Therapy

## 2019-12-23 ENCOUNTER — Ambulatory Visit
Admission: RE | Admit: 2019-12-23 | Discharge: 2019-12-23 | Disposition: A | Discharge status: Home / Self Care | Payer: Medicare Other | Source: Ambulatory Visit | Attending: Radiation Oncology | Admitting: Radiation Oncology

## 2019-12-23 DIAGNOSIS — C3490 Malignant neoplasm of unspecified part of unspecified bronchus or lung: Secondary | ICD-10-CM

## 2019-12-23 DIAGNOSIS — C7931 Secondary malignant neoplasm of brain: Secondary | ICD-10-CM

## 2019-12-23 DIAGNOSIS — R29898 Other symptoms and signs involving the musculoskeletal system: Secondary | ICD-10-CM

## 2019-12-23 DIAGNOSIS — G893 Neoplasm related pain (acute) (chronic): Secondary | ICD-10-CM

## 2019-12-23 DIAGNOSIS — C7951 Secondary malignant neoplasm of bone: Secondary | ICD-10-CM

## 2019-12-23 NOTE — Progress Notes (Addendum)
Radiation Oncology         [phone] ________________________________  Outpatient ReConsultation - Conducted via telephone due to current COVID-19 concerns for limiting patient exposure  I spoke with the patient to conduct this consult visit via telephone to spare the patient unnecessary potential exposure in the healthcare setting during the current COVID-19 pandemic. The patient was notified in advance and was offered a [place] meeting to allow for face to face communication but unfortunately reported that they did not have the appropriate resources/technology to support such a visit and instead preferred to proceed with a telephone visit.    Name: Deborah Foster MRN: [id number]  Date of Service: 12/23/2019  DOB: [birth year]  Diagnosis:   Progressive Metastatic Stage IV T2a, N1, M1b NSCLC, adenocarcinoma, positive EGFR mutation with deletion in exon 19, of the right middle lobe.   Interval Since Last Radiation:  7 months  05/19/19-05/29/19: The RUL target and right adrenal gland targets were each treated to 60 Gy in 5 fractions.  04/16/2018-04/29/2018: The left supraclavicular nodes and retroperitoneal adenopathy was treated to 30 GY in 10 fractions  04/01/2017 - 04/10/2017: Left Adrenal Gland / 40 Gy in 5 fractions  07/21/2015 to 08/03/2015:  The Right hilar tumor was treated to 30 Gy in 10 fractions at 3 Gy per fraction.   08/23/14-09/07/14:  30 Gy in 12 fractions was prescribed to the right lung, whole brain, and left hip.   Narrative:  The patient is a pleasant 49 y.o. female who is well known to [place] with a longstanding history of stage IV NSCLC, adenocarcinoma of the right lung with metastatic disease to the brain and bones. She most recently received radiotherapy to a metastatic deposit to the right adrenal gland. She was not a good candidate for re-treatment of the left adrenal gland, due to her previous fields of radiotherapy, so the right adrenal and the RUL were the sites treated.   Since her last visit she has been followed by Dr. Julien Nordmann and is currently receiving Tagrisso, she had been started on Xaldori but was unable to tolerate this but has been on hold for the last 6 months. Of note she has also had symptoms of pancreatitis for which she was hospitalized from 11/5 to 11/27/2019 and found to have hyponatremia and hypochloremia back pain and abdominal pain was treated with palliative cares guidance, and she followed up with Dr. Julien Nordmann who is recommended continuation of her Tagrisso. She underwent a CT scan of the abdomen and pelvis also during her hospitalization on 11/21/2019 which showed redemonstration of diffuse osseous metastatic disease including sclerotic foci in the spine and more expansile lytic and sclerotic changes of the left ilium and acetabulum. She has been having symptoms of left hip pain as well as shoulder pain on the right. A MRI of her hip has been ordered but not performed, she will be due for routine surveillance with brain MRI in January 2022. She is seen today however to discuss options of palliative radiotherapy.  On review of systems, the patient reports that she is doing well overall. Her pain is worst in her left hip and is a shooting quality that keeps her from being able to weight bear. Her pain is quite terrible and she has only been taking dilaudid once a day in addition to her long acting morphine. She also describes pain in bilateral shoulders and in her mid back. She denies any chest pain, shortness of breath, cough, fevers, chills, night sweats, unintended weight changes.  She denies any bowel or bladder disturbances, and denies abdominal pain, nausea or vomiting.  A complete review of systems is obtained and is otherwise negative.  PAST MEDICAL HISTORY:  Past Medical History:  Diagnosis Date  . Bone metastasis ([place])   . Hemoptysis   . Hypokalemia   . lung ca dx'd 07/2014  . Lung mass   . Metastasis to adrenal gland ([place])   . Metastasis to  brain ([place])   . Pneumonia   . Radiation 08/23/14-09/07/14   Brain/chest and left hip 30 Gy 12 Fx  . URI (upper respiratory infection) 02-02-2015    PAST SURGICAL HISTORY: Past Surgical History:  Procedure Laterality Date  . IR GENERIC HISTORICAL  07/14/2015   IR RADIOLOGIST EVAL & MGMT 07/14/2015 Greggory Keen, MD [facility] INTERV RAD  . VIDEO BRONCHOSCOPY Bilateral 08/09/2014   Procedure: VIDEO BRONCHOSCOPY WITHOUT FLUORO;  Surgeon: [name], MD;  Location: [facility] ENDOSCOPY;  Service: Cardiopulmonary;  Laterality: Bilateral;  . VIDEO BRONCHOSCOPY Bilateral 07/06/2015   Procedure: VIDEO BRONCHOSCOPY WITHOUT FLUORO;  Surgeon: [name], MD;  Location: [facility] ENDOSCOPY;  Service: Cardiopulmonary;  Laterality: Bilateral;    PAST SOCIAL HISTORY:  Social History   Socioeconomic History  . Marital status: Married    Spouse name: Not on file  . Number of children: 3  . Years of education: Not on file  . Highest education level: Not on file  Occupational History  . Occupation: unemployed  Tobacco Use  . Smoking status: Never Smoker  . Smokeless tobacco: Never Used  Vaping Use  . Vaping Use: Never used  Substance and Sexual Activity  . Alcohol use: No    Alcohol/week: 0.0 standard drinks  . Drug use: No  . Sexual activity: Not Currently  Other Topics Concern  . Not on file  Social History Narrative   Patient moved to [place] in [place].   Had been in [place] since that time.   Social Determinants of Health   Financial Resource Strain:   . Difficulty of Paying Living Expenses: Not on file  Food Insecurity:   . Worried About Charity fundraiser in the Last Year: Not on file  . Ran Out of Food in the Last Year: Not on file  Transportation Needs:   . Lack of Transportation (Medical): Not on file  . Lack of Transportation (Non-Medical): Not on file  Physical Activity:   . Days of Exercise per Week: Not on file  . Minutes of Exercise per Session: Not on file   Stress:   . Feeling of Stress : Not on file  Social Connections:   . Frequency of Communication with Friends and Family: Not on file  . Frequency of Social Gatherings with Friends and Family: Not on file  . Attends Religious Services: Not on file  . Active Member of Clubs or Organizations: Not on file  . Attends Archivist Meetings: Not on file  . Marital Status: Not on file  Intimate Partner Violence:   . Fear of Current or Ex-Partner: Not on file  . Emotionally Abused: Not on file  . Physically Abused: Not on file  . 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 She's originally from [place]. She has three sons, in their teenage years.  PAST FAMILY HISTORY: Family History  Problem Relation Age of Onset  . Hyperlipidemia Mother     MEDICATIONS  Current Outpatient Medications  Medication Sig Dispense Refill  .  calcium-vitamin D (OSCAL WITH D) 500-200 MG-UNIT tablet Take 1 tablet by mouth 3 (three) times daily.    . Cyanocobalamin (VITAMIN B-12) 2500 MCG SUBL Place 2,500 mcg under the tongue daily.    Marland Kitchen dexamethasone (DECADRON) 4 MG tablet Take 1 tablet (4 mg total) by mouth every 12 (twelve) hours. 28 tablet 1  . diclofenac Sodium (VOLTAREN) 1 % GEL Apply 2 g topically 4 (four) times daily. 2 g 1  . HYDROmorphone (DILAUDID) 4 MG tablet Take 1 tablet (4 mg total) by mouth every 6 (six) hours as needed for severe pain. 56 tablet 0  . lactulose (CHRONULAC) 10 GM/15ML solution Take 30 mLs (20 g total) by mouth 2 (two) times daily as needed for moderate constipation. 1892 mL 2  . levETIRAcetam (KEPPRA) 250 MG tablet Take 1 tablet (250 mg total) by mouth 2 (two) times daily. 60 tablet 5  . loratadine (CLARITIN) 10 MG tablet Take 1 tablet (10 mg total) by mouth daily. 30 tablet 0  . morphine (MS CONTIN) 30 MG 12 hr tablet Take 1 tablet (30 mg total) by mouth every 12 (twelve) hours. 60 tablet 0  . ondansetron (ZOFRAN) 8 MG tablet Take 8 mg by mouth  every 8 (eight) hours as needed for nausea or vomiting.     . pantoprazole (PROTONIX) 40 MG tablet Take 40 mg by mouth daily.    . polyethylene glycol (MIRALAX / GLYCOLAX) 17 g packet Take 17 g by mouth 2 (two) times daily. 72 each 1  . senna-docusate (SENOKOT-S) 8.6-50 MG tablet Take 1 tablet by mouth 2 (two) times daily. 60 tablet 0  . TAGRISSO 80 MG tablet TAKE 1 TABLET (80 MG TOTAL) BY MOUTH DAILY. (Patient taking differently: Take 80 mg by mouth daily. ) 30 tablet 11  . XARELTO 20 MG TABS tablet Take 20 mg by mouth daily at 6 (six) AM.     No current facility-administered medications for this encounter.   Facility-Administered Medications Ordered in Other Encounters  Medication Dose Route Frequency Provider Last Rate Last Admin  . denosumab (XGEVA) injection 120 mg  120 mg Subcutaneous Once [name], MD        ALLERGIES:  Allergies  Allergen Reactions  . Gabapentin Other (See Comments)    Abdominal pain  . Other Other (See Comments)    Patient states that there is some she takes that makes her throat "cry" AND SCRATCHY   . Tramadol Nausea And Vomiting    Lowers seizure threshold  . Doxycycline Nausea And Vomiting   PHYSICAL EXAM: Unable to assess due to encounter type.  Impression/Plan: 1. Progressive Metastatic Stage IV T2a, N1, M1b NSCLC, adenocarcinoma of the right middle lobe. Dr. Lisbeth Renshaw discusses the patient's course since her last visit and treatment. We discussed the risks, benefits, short, and long term effects of radiotherapy, as well as the curative intent, and the patient is interested in proceeding. Dr. Lisbeth Renshaw discusses the delivery and logistics of radiotherapy and anticipates a course of 2 weeks of radiotherapy to the left hip and possibly shoulders. He recommends diagnostic imaging to include a PET scan rather than further MRI of the shoulder, but that we should still complete the MRI of the hip. I believe her back pain is at the same level of her hip and may be  the same location posteriorly of the iliac bone that is contributing to her description. We will plan simulation once we know when her PET scan can occur and at that time  would sign written consent to proceed.  She will also continue to follow up with Dr. Julien Nordmann in medical oncology and will continue with the brain oncology program with Dr. Mickeal Skinner in neuro oncology.   2. Pain secondary to #1. The patient is taking Dilaudid once a day, I encouraged her to increase this to every 6 hours prn and continue her long acting morphine as well. Additional modifications may need to be made along the way.   Given current concerns for patient exposure during the COVID-19 pandemic, this encounter was conducted via telephone.  The patient has provided two factor identification and has given verbal consent for this type of encounter and has been advised to only accept a meeting of this type in a secure network environment. The time spent during this encounter was 45 minutes including preparation, discussion, and coordination of the patient's care. The attendants for this meeting include Blenda Nicely, RN, Dr. Lisbeth Renshaw, Hayden Pedro  and Jennefer Bravo.  During the encounter, Dr. Lisbeth Renshaw, and Hayden Pedro were located at [facility].  Rickayla T Falero was located at home and assistance through [employer] was utilized by phone.  .  The above documentation reflects my direct findings during this shared patient visit. Please see the separate note by Dr. Lisbeth Renshaw on this date for the remainder of the patient's plan of care.     Carola Rhine, PAC   Addendum: Dr. Lisbeth Renshaw spoke with the patient after I left the call and toward the end of her visit, she mentioned to him that she's had some weakness in her hands bilaterally. He recommends also trying to get an MRI of her cervical spine. I will add this to the orders and follow up with the results in brain and  spine conference as well.    Carola Rhine, PAC

## 2019-12-23 NOTE — Telephone Encounter (Signed)
Called patient to inform of Pet Scan for 01-01-20- arrival time- 12:30 pm @ [facility], patient to have water only - 6 hrs.prior to test, spoke with patient and she is aware of this test

## 2019-12-23 NOTE — Addendum Note (Signed)
Encounter addended by: Hayden Pedro, PA-C on: 12/23/2019 1:01 PM  Actions taken: Clinical Note Signed

## 2019-12-24 ENCOUNTER — Other Ambulatory Visit: Payer: Self-pay | Admitting: Radiation Therapy

## 2019-12-29 ENCOUNTER — Observation Stay (HOSPITAL_COMMUNITY): Payer: Medicare Other

## 2019-12-29 ENCOUNTER — Other Ambulatory Visit: Payer: Self-pay

## 2019-12-29 ENCOUNTER — Inpatient Hospital Stay (HOSPITAL_COMMUNITY)
Admission: EM | Admit: 2019-12-29 | Discharge: 2020-01-04 | DRG: 640 | Disposition: A | Discharge status: Home Health | Payer: Medicare Other | Attending: Internal Medicine | Admitting: Internal Medicine

## 2019-12-29 ENCOUNTER — Encounter (HOSPITAL_COMMUNITY): Payer: Self-pay | Admitting: *Deleted

## 2019-12-29 ENCOUNTER — Emergency Department (HOSPITAL_COMMUNITY): Payer: Medicare Other

## 2019-12-29 ENCOUNTER — Emergency Department (HOSPITAL_BASED_OUTPATIENT_CLINIC_OR_DEPARTMENT_OTHER)
Admission: EM | Admit: 2019-12-29 | Discharge: 2019-12-29 | Disposition: A | Discharge status: Home / Self Care | Payer: Medicare Other | Source: Home / Self Care | Attending: Emergency Medicine | Admitting: Emergency Medicine

## 2019-12-29 DIAGNOSIS — G40909 Epilepsy, unspecified, not intractable, without status epilepticus: Secondary | ICD-10-CM | POA: Diagnosis present

## 2019-12-29 DIAGNOSIS — M7989 Other specified soft tissue disorders: Secondary | ICD-10-CM | POA: Diagnosis not present

## 2019-12-29 DIAGNOSIS — E861 Hypovolemia: Secondary | ICD-10-CM | POA: Diagnosis present

## 2019-12-29 DIAGNOSIS — C50919 Malignant neoplasm of unspecified site of unspecified female breast: Secondary | ICD-10-CM | POA: Diagnosis present

## 2019-12-29 DIAGNOSIS — E43 Unspecified severe protein-calorie malnutrition: Secondary | ICD-10-CM | POA: Diagnosis present

## 2019-12-29 DIAGNOSIS — Z79899 Other long term (current) drug therapy: Secondary | ICD-10-CM

## 2019-12-29 DIAGNOSIS — N12 Tubulo-interstitial nephritis, not specified as acute or chronic: Secondary | ICD-10-CM | POA: Diagnosis present

## 2019-12-29 DIAGNOSIS — R7989 Other specified abnormal findings of blood chemistry: Secondary | ICD-10-CM | POA: Diagnosis not present

## 2019-12-29 DIAGNOSIS — E878 Other disorders of electrolyte and fluid balance, not elsewhere classified: Secondary | ICD-10-CM | POA: Diagnosis present

## 2019-12-29 DIAGNOSIS — E876 Hypokalemia: Secondary | ICD-10-CM | POA: Diagnosis not present

## 2019-12-29 DIAGNOSIS — I82813 Embolism and thrombosis of superficial veins of lower extremities, bilateral: Secondary | ICD-10-CM | POA: Diagnosis present

## 2019-12-29 DIAGNOSIS — Z681 Body mass index (BMI) 19 or less, adult: Secondary | ICD-10-CM

## 2019-12-29 DIAGNOSIS — D696 Thrombocytopenia, unspecified: Secondary | ICD-10-CM | POA: Diagnosis present

## 2019-12-29 DIAGNOSIS — C7951 Secondary malignant neoplasm of bone: Secondary | ICD-10-CM | POA: Diagnosis present

## 2019-12-29 DIAGNOSIS — L89156 Pressure-induced deep tissue damage of sacral region: Secondary | ICD-10-CM | POA: Diagnosis present

## 2019-12-29 DIAGNOSIS — C3490 Malignant neoplasm of unspecified part of unspecified bronchus or lung: Secondary | ICD-10-CM | POA: Diagnosis present

## 2019-12-29 DIAGNOSIS — D630 Anemia in neoplastic disease: Secondary | ICD-10-CM | POA: Diagnosis present

## 2019-12-29 DIAGNOSIS — R21 Rash and other nonspecific skin eruption: Secondary | ICD-10-CM | POA: Diagnosis present

## 2019-12-29 DIAGNOSIS — Z20822 Contact with and (suspected) exposure to covid-19: Secondary | ICD-10-CM | POA: Diagnosis present

## 2019-12-29 DIAGNOSIS — Z86718 Personal history of other venous thrombosis and embolism: Secondary | ICD-10-CM

## 2019-12-29 DIAGNOSIS — E871 Hypo-osmolality and hyponatremia: Principal | ICD-10-CM | POA: Diagnosis present

## 2019-12-29 DIAGNOSIS — K853 Drug induced acute pancreatitis without necrosis or infection: Secondary | ICD-10-CM | POA: Diagnosis present

## 2019-12-29 DIAGNOSIS — Z83438 Family history of other disorder of lipoprotein metabolism and other lipidemia: Secondary | ICD-10-CM

## 2019-12-29 DIAGNOSIS — Y842 Radiological procedure and radiotherapy as the cause of abnormal reaction of the patient, or of later complication, without mention of misadventure at the time of the procedure: Secondary | ICD-10-CM | POA: Diagnosis present

## 2019-12-29 DIAGNOSIS — B9561 Methicillin susceptible Staphylococcus aureus infection as the cause of diseases classified elsewhere: Secondary | ICD-10-CM | POA: Diagnosis present

## 2019-12-29 DIAGNOSIS — R000 Tachycardia, unspecified: Secondary | ICD-10-CM | POA: Diagnosis present

## 2019-12-29 DIAGNOSIS — Z7901 Long term (current) use of anticoagulants: Secondary | ICD-10-CM

## 2019-12-29 HISTORY — DX: Malignant neoplasm of bone and articular cartilage, unspecified: C41.9

## 2019-12-29 LAB — RESP PANEL BY RT-PCR (FLU A&B, COVID) ARPGX2
Influenza A by PCR: NEGATIVE
Influenza B by PCR: NEGATIVE
SARS Coronavirus 2 by RT PCR: NEGATIVE

## 2019-12-29 LAB — URINALYSIS, ROUTINE W REFLEX MICROSCOPIC
Bacteria, UA: NONE SEEN
Bilirubin Urine: NEGATIVE
Glucose, UA: NEGATIVE mg/dL
Ketones, ur: 20 mg/dL — AB
Leukocytes,Ua: NEGATIVE
Nitrite: NEGATIVE
Protein, ur: NEGATIVE mg/dL
Specific Gravity, Urine: >1.046 — ABNORMAL HIGH (ref 1.005–1.030)
pH: 7 (ref 5.0–8.0)

## 2019-12-29 LAB — CBC
HCT: 29.3 % — ABNORMAL LOW (ref 36.0–46.0)
Hemoglobin: 10.1 g/dL — ABNORMAL LOW (ref 12.0–15.0)
MCH: 29.4 pg (ref 26.0–34.0)
MCHC: 34.5 g/dL (ref 30.0–36.0)
MCV: 85.4 fL (ref 80.0–100.0)
Platelets: 63 10*3/uL — ABNORMAL LOW (ref 150–400)
RBC: 3.43 MIL/uL — ABNORMAL LOW (ref 3.87–5.11)
RDW: 14.6 % (ref 11.5–15.5)
WBC: 6.4 10*3/uL (ref 4.0–10.5)
nRBC: 0 % (ref 0.0–0.2)

## 2019-12-29 LAB — MAGNESIUM: Magnesium: 2.2 mg/dL (ref 1.7–2.4)

## 2019-12-29 LAB — BASIC METABOLIC PANEL
Anion gap: 13 (ref 5–15)
BUN: 13 mg/dL (ref 6–20)
CO2: 36 mmol/L — ABNORMAL HIGH (ref 22–32)
Calcium: 7.8 mg/dL — ABNORMAL LOW (ref 8.9–10.3)
Chloride: 71 mmol/L — ABNORMAL LOW (ref 98–111)
Creatinine, Ser: 0.38 mg/dL — ABNORMAL LOW (ref 0.44–1.00)
GFR, Estimated: >60 mL/min (ref >60)
Glucose, Bld: 116 mg/dL — ABNORMAL HIGH (ref 70–99)
Potassium: 2.3 mmol/L — CL (ref 3.5–5.1)
Sodium: 120 mmol/L — ABNORMAL LOW (ref 135–145)

## 2019-12-29 LAB — HEPATIC FUNCTION PANEL
ALT: 13 U/L (ref 0–44)
AST: 22 U/L (ref 15–41)
Albumin: 2.4 g/dL — ABNORMAL LOW (ref 3.5–5.0)
Alkaline Phosphatase: 82 U/L (ref 38–126)
Bilirubin, Direct: 0.1 mg/dL (ref 0.0–0.2)
Indirect Bilirubin: 0.5 mg/dL (ref 0.3–0.9)
Total Bilirubin: 0.6 mg/dL (ref 0.3–1.2)
Total Protein: 5.3 g/dL — ABNORMAL LOW (ref 6.5–8.1)

## 2019-12-29 LAB — PHOSPHORUS: Phosphorus: 2.7 mg/dL (ref 2.5–4.6)

## 2019-12-29 LAB — PROTIME-INR
INR: 1.4 — ABNORMAL HIGH (ref 0.8–1.2)
Prothrombin Time: 16.6 s — ABNORMAL HIGH (ref 11.4–15.2)

## 2019-12-29 LAB — CBG MONITORING, ED: Glucose-Capillary: 116 mg/dL — ABNORMAL HIGH (ref 70–99)

## 2019-12-29 LAB — HEPARIN LEVEL (UNFRACTIONATED): Heparin Unfractionated: 0.84 IU/mL — ABNORMAL HIGH (ref 0.30–0.70)

## 2019-12-29 LAB — LIPASE, BLOOD: Lipase: 17 U/L (ref 11–51)

## 2019-12-29 LAB — LACTIC ACID, PLASMA: Lactic Acid, Venous: 1 mmol/L (ref 0.5–1.9)

## 2019-12-29 LAB — APTT: aPTT: 39 s — ABNORMAL HIGH (ref 24–36)

## 2019-12-29 MED ORDER — MORPHINE SULFATE (PF) 4 MG/ML IV SOLN
4.0000 mg | Freq: Once | INTRAVENOUS | Status: AC
Start: 2019-12-29 — End: 2019-12-29
  Administered 2019-12-29: 17:00:00 4 mg via INTRAVENOUS
  Filled 2019-12-29: qty 1

## 2019-12-29 MED ORDER — SODIUM CHLORIDE (PF) 0.9 % IJ SOLN
INTRAMUSCULAR | Status: AC
  Filled 2019-12-29: qty 50

## 2019-12-29 MED ORDER — SODIUM CHLORIDE 0.9 % IV BOLUS
500.0000 mL | Freq: Once | INTRAVENOUS | Status: AC
  Administered 2019-12-29: 15:00:00 500 mL via INTRAVENOUS

## 2019-12-29 MED ORDER — LACTATED RINGERS IV BOLUS
500.0000 mL | Freq: Once | INTRAVENOUS | Status: AC
  Administered 2019-12-29: 12:00:00 500 mL via INTRAVENOUS

## 2019-12-29 MED ORDER — SENNOSIDES-DOCUSATE SODIUM 8.6-50 MG PO TABS
1.0000 | ORAL_TABLET | Freq: Two times a day (BID) | ORAL | Status: DC
  Administered 2019-12-30 – 2020-01-03 (×10): 1 via ORAL
  Filled 2019-12-29 (×11): qty 1

## 2019-12-29 MED ORDER — POTASSIUM CHLORIDE 20 MEQ PO PACK
40.0000 meq | PACK | ORAL | Status: AC
  Filled 2019-12-29: qty 2

## 2019-12-29 MED ORDER — SODIUM CHLORIDE 0.9 % IV SOLN: INTRAVENOUS | Status: AC

## 2019-12-29 MED ORDER — POTASSIUM CHLORIDE 10 MEQ/100ML IV SOLN
10.0000 meq | INTRAVENOUS | Status: AC
  Administered 2019-12-29 (×4): 10 meq via INTRAVENOUS
  Filled 2019-12-29 (×4): qty 100

## 2019-12-29 MED ORDER — LEVETIRACETAM 250 MG PO TABS
250.0000 mg | ORAL_TABLET | Freq: Two times a day (BID) | ORAL | Status: DC
  Administered 2019-12-30 – 2020-01-04 (×11): 250 mg via ORAL
  Filled 2019-12-29 (×13): qty 1

## 2019-12-29 MED ORDER — SODIUM CHLORIDE 0.9 % IV SOLN
1.0000 g | INTRAVENOUS | Status: DC
  Administered 2019-12-29 – 2019-12-30 (×2): 1 g via INTRAVENOUS
  Filled 2019-12-29: qty 10
  Filled 2019-12-29: qty 1

## 2019-12-29 MED ORDER — IOHEXOL 300 MG/ML  SOLN
100.0000 mL | Freq: Once | INTRAMUSCULAR | Status: AC | PRN
  Administered 2019-12-29: 15:00:00 75 mL via INTRAVENOUS

## 2019-12-29 MED ORDER — ONDANSETRON HCL 8 MG PO TABS: 8.0000 mg | ORAL_TABLET | Freq: Three times a day (TID) | ORAL | Status: DC | PRN

## 2019-12-29 MED ORDER — GABAPENTIN 300 MG PO CAPS
600.0000 mg | ORAL_CAPSULE | Freq: Two times a day (BID) | ORAL | Status: DC
  Administered 2019-12-30 – 2020-01-01 (×6): 600 mg via ORAL
  Administered 2020-01-02: 22:00:00 300 mg via ORAL
  Administered 2020-01-02 – 2020-01-03 (×2): 600 mg via ORAL
  Administered 2020-01-03: 22:00:00 300 mg via ORAL
  Administered 2020-01-04: 11:00:00 600 mg via ORAL
  Filled 2019-12-29 (×10): qty 2
  Filled 2019-12-29: qty 6
  Filled 2019-12-29: qty 2

## 2019-12-29 MED ORDER — OSIMERTINIB MESYLATE 80 MG PO TABS: 80.0000 mg | ORAL_TABLET | Freq: Every day | ORAL | Status: DC

## 2019-12-29 MED ORDER — MORPHINE SULFATE (PF) 4 MG/ML IV SOLN
4.0000 mg | Freq: Once | INTRAVENOUS | Status: AC
  Administered 2019-12-29: 11:00:00 4 mg via INTRAVENOUS
  Filled 2019-12-29: qty 1

## 2019-12-29 MED ORDER — POLYETHYLENE GLYCOL 3350 17 G PO PACK
17.0000 g | PACK | Freq: Two times a day (BID) | ORAL | Status: DC
  Administered 2019-12-30 – 2020-01-03 (×7): 17 g via ORAL
  Filled 2019-12-29 (×10): qty 1

## 2019-12-29 MED ORDER — HEPARIN (PORCINE) 25000 UT/250ML-% IV SOLN
600.0000 [IU]/h | INTRAVENOUS | Status: DC
  Administered 2019-12-29: 22:00:00 600 [IU]/h via INTRAVENOUS
  Filled 2019-12-29: qty 250

## 2019-12-29 MED ORDER — MORPHINE SULFATE (PF) 4 MG/ML IV SOLN
4.0000 mg | Freq: Once | INTRAVENOUS | Status: AC
Start: 2019-12-29 — End: 2019-12-29
  Administered 2019-12-29: 14:00:00 4 mg via INTRAVENOUS
  Filled 2019-12-29: qty 1

## 2019-12-29 MED ORDER — LACTULOSE 10 GM/15ML PO SOLN: 20.0000 g | Freq: Two times a day (BID) | ORAL | Status: DC | PRN

## 2019-12-29 MED ORDER — HYDROMORPHONE HCL 4 MG PO TABS
4.0000 mg | ORAL_TABLET | Freq: Four times a day (QID) | ORAL | Status: DC | PRN
  Administered 2019-12-30 – 2020-01-03 (×8): 4 mg via ORAL
  Filled 2019-12-29 (×9): qty 1

## 2019-12-29 MED ORDER — FERROUS SULFATE 325 (65 FE) MG PO TABS
325.0000 mg | ORAL_TABLET | Freq: Every day | ORAL | Status: DC
  Administered 2019-12-30 – 2020-01-04 (×6): 325 mg via ORAL
  Filled 2019-12-29 (×6): qty 1

## 2019-12-29 MED ORDER — MORPHINE SULFATE ER 15 MG PO TBCR
30.0000 mg | EXTENDED_RELEASE_TABLET | Freq: Two times a day (BID) | ORAL | Status: DC
  Administered 2019-12-30: 22:00:00 15 mg via ORAL
  Administered 2019-12-30 – 2020-01-04 (×12): 30 mg via ORAL
  Filled 2019-12-29 (×13): qty 2

## 2019-12-29 NOTE — ED Triage Notes (Signed)
BIB EMS with weakness, lower abd pain and back pain, with mets from Ca to bones and lungs. 110/87-103-14-96.6-99% RA -CBG 138

## 2019-12-29 NOTE — H&P (Addendum)
History and Physical    Deborah Foster:[id number] DOB: 05/27/1970 DOA: 12/29/2019  I have briefly reviewed the patient's prior medical records in [place]  PCP: [name], MD  Patient coming from: home  Chief Complaint: Left-sided flank pain, nausea  [employer] interpreter used, even so there is some language barrier as interpreter had to frequently reask the question and try to clarify patient's answers  HPI: Deborah Foster is a 49 y.o. female with medical history significant of stage IV metastatic breast cancer follows with Dr. Julien Nordmann, prior treatments with radiotherapy to the brain as well as metastatic bone lesions in the pelvis, status post Tarceva, currently on Tagrisso, Delton See.  Patient has also been complaining of lower suprapubic abdominal pain along with nausea.  Recently she tells me she has had poor p.o. intake since she stopped her thyroid medications at the direction of Dr. Earlie Server a week ago.  She denies any chest pain, denies any shortness of breath.  She denies any fever or chills.  She tells me she has difficulties urinating and has a hard time starting to pee.  This has been going on for the past week.  ED Course: In the emergency room she is afebrile, heart rate is 100, she is normotensive and satting well on room air.  Her blood work shows a sodium of 120, potassium 2.3, chloride 71.  Kidney function is normal.  CBC is unremarkable.  She underwent a CT scan of the abdomen and pelvis which showed progressive intrahepatic and CBD dilation, questioning CBD stone, findings consistent with pyelonephritis as well as concern for acute pancreatitis.  We are asked to admit  Review of Systems: All systems reviewed, and apart from HPI, all negative  Past Medical History:  Diagnosis Date  . Bone cancer ([place])   . Bone metastasis ([place])   . Hemoptysis   . Hypokalemia   . lung ca dx'd 07/2014  . Lung mass   . Metastasis to adrenal gland ([place])   . Metastasis to brain  ([place])   . Pneumonia   . Radiation 08/23/14-09/07/14   Brain/chest and left hip 30 Gy 12 Fx  . URI (upper respiratory infection) 02-02-2015    Past Surgical History:  Procedure Laterality Date  . IR GENERIC HISTORICAL  07/14/2015   IR RADIOLOGIST EVAL & MGMT 07/14/2015 Greggory Keen, MD [facility] INTERV RAD  . VIDEO BRONCHOSCOPY Bilateral 08/09/2014   Procedure: VIDEO BRONCHOSCOPY WITHOUT FLUORO;  Surgeon: [name], MD;  Location[facility] ENDOSCOPY;  Service: Cardiopulmonary;  Laterality: Bilateral;  . VIDEO BRONCHOSCOPY Bilateral 07/06/2015   Procedure: VIDEO BRONCHOSCOPY WITHOUT FLUORO;  Surgeon: [name], MD;  Location: [facility] ENDOSCOPY;  Service: Cardiopulmonary;  Laterality: Bilateral;     reports that she has never smoked. She has never used smokeless tobacco. She reports that she does not drink alcohol and does not use drugs.  Allergies  Allergen Reactions  . Gabapentin Other (See Comments)    Abdominal pain  . Other Other (See Comments)    Patient states that there is some she takes that makes her throat "cry" AND SCRATCHY   . Tramadol Nausea And Vomiting    Lowers seizure threshold  . Doxycycline Nausea And Vomiting    Family History  Problem Relation Age of Onset  . Hyperlipidemia Mother     Prior to Admission medications   Medication Sig Start Date End Date Taking? Authorizing Provider  calcium-vitamin D (OSCAL WITH D) 500-200 MG-UNIT tablet Take 1 tablet  by mouth 3 (three) times daily. 11/27/19  Yes Eugenie Filler, MD  Cyanocobalamin (VITAMIN B-12) 2500 MCG SUBL Place 2,500 mcg under the tongue daily.   Yes [provider]  diclofenac Sodium (VOLTAREN) 1 % GEL Apply 2 g topically 4 (four) times daily. 11/27/19  Yes Eugenie Filler, MD  ferrous sulfate 325 (65 FE) MG tablet Take 325 mg by mouth daily with breakfast.   Yes [provider]  folic acid (FOLVITE) 201 MCG tablet Take 800 mcg by mouth daily.   Yes [provider]   furosemide (LASIX) 80 MG tablet Take 40-80 mg by mouth daily as needed for fluid. 12/14/19  Yes [provider]  gabapentin (NEURONTIN) 300 MG capsule Take 600 mg by mouth 2 (two) times daily. 12/16/19  Yes [provider]  HYDROmorphone (DILAUDID) 4 MG tablet Take 1 tablet (4 mg total) by mouth every 6 (six) hours as needed for severe pain. 11/27/19  Yes Acquanetta Chain, DO  lactulose (CHRONULAC) 10 GM/15ML solution Take 30 mLs (20 g total) by mouth 2 (two) times daily as needed for moderate constipation. 11/30/19  Yes Tanner, Lyndon Code., PA-C  levETIRAcetam (KEPPRA) 250 MG tablet Take 1 tablet (250 mg total) by mouth 2 (two) times daily. 07/02/19  Yes Vaslow, Acey Lav, MD  loratadine (CLARITIN) 10 MG tablet Take 1 tablet (10 mg total) by mouth daily. Patient taking differently: Take 10 mg by mouth daily as needed for allergies. 04/30/16  Yes [name], MD  morphine (MS CONTIN) 30 MG 12 hr tablet Take 1 tablet (30 mg total) by mouth every 12 (twelve) hours. 12/08/19  Yes [name], MD  ondansetron (ZOFRAN) 8 MG tablet Take 8 mg by mouth every 8 (eight) hours as needed for nausea or vomiting.  06/30/19  Yes [provider]  polyethylene glycol (MIRALAX / GLYCOLAX) 17 g packet Take 17 g by mouth 2 (two) times daily. 11/27/19  Yes Eugenie Filler, MD  senna-docusate (SENOKOT-S) 8.6-50 MG tablet Take 1 tablet by mouth 2 (two) times daily. 11/27/19  Yes Eugenie Filler, MD  TAGRISSO 80 MG tablet TAKE 1 TABLET (80 MG TOTAL) BY MOUTH DAILY. Patient taking differently: Take 80 mg by mouth daily. 04/28/19  Yes [name], MD  XARELTO 20 MG TABS tablet Take 20 mg by mouth daily at 6 (six) AM. 12/03/19  Yes [provider]  dexamethasone (DECADRON) 4 MG tablet Take 1 tablet (4 mg total) by mouth every 12 (twelve) hours. Patient not taking: No sig reported 11/27/19   Acquanetta Chain, DO    Physical Exam: Vitals:   12/29/19 1357 12/29/19 1403  12/29/19 1525 12/29/19 1645  BP:  99/65 103/68 118/72  Pulse: (!) 106 (!) 105 99 (!) 105  Resp: (!) 8 16 16 16   Temp:      TempSrc:      SpO2: 100% 100% 100% 100%  Weight:      Height:        Constitutional: Gastritic appearing Eyes: PERRL, lids and conjunctivae normal ENMT: Mucous membranes are dry Neck: normal, supple Respiratory: clear to auscultation bilaterally, no wheezing, no crackles. Normal respiratory effort. No accessory muscle use.  Cardiovascular: Regular rate and rhythm, no murmurs / rubs / gallops.  1+ pitting lower extremity edema Abdomen: Tenderness to palpation in the suprapubic area, otherwise nontender, no guarding, no rebound Musculoskeletal: no clubbing / cyanosis.  CVA tenderness on left Skin: no rashes Neurologic: CN 2-12 grossly intact. Strength 5/5  in all 4.  Psychiatric: Normal judgment and insight. Alert and oriented x 3. Normal mood.   Labs on Admission: I have personally reviewed following labs and imaging studies  CBC: Recent Labs  Lab 12/29/19 1035  WBC 6.4  HGB 10.1*  HCT 29.3*  MCV 85.4  PLT 63*   Basic Metabolic Panel: Recent Labs  Lab 12/29/19 1035  NA 120*  K 2.3*  CL 71*  CO2 36*  GLUCOSE 116*  BUN 13  CREATININE 0.38*  CALCIUM 7.8*  MG 2.2  PHOS 2.7   Liver Function Tests: Recent Labs  Lab 12/29/19 1035  AST 22  ALT 13  ALKPHOS 82  BILITOT 0.6  PROT 5.3*  ALBUMIN 2.4*   Coagulation Profile: No results for input(s): INR, PROTIME in the last 168 hours. BNP (last 3 results) No results for input(s): PROBNP in the last 8760 hours. CBG: Recent Labs  Lab 12/29/19 1011  GLUCAP 116*   Thyroid Function Tests: No results for input(s): TSH, T4TOTAL, FREET4, T3FREE, THYROIDAB in the last 72 hours. Urine analysis:    Component Value Date/Time   COLORURINE YELLOW 11/21/2019 0109   APPEARANCEUR HAZY (A) 11/21/2019 0109   LABSPEC 1.017 11/21/2019 0109   LABSPEC 1.005 09/26/2015 0920   PHURINE 7.0 11/21/2019 0109    GLUCOSEU NEGATIVE 11/21/2019 0109   GLUCOSEU Negative 09/26/2015 0920   HGBUR NEGATIVE 11/21/2019 0109   BILIRUBINUR NEGATIVE 11/21/2019 0109   BILIRUBINUR Negative 09/26/2015 0920   KETONESUR 20 (A) 11/21/2019 0109   PROTEINUR NEGATIVE 11/21/2019 0109   UROBILINOGEN 0.2 09/26/2015 0920   NITRITE NEGATIVE 11/21/2019 0109   LEUKOCYTESUR NEGATIVE 11/21/2019 0109   LEUKOCYTESUR Negative 09/26/2015 0920     Radiological Exams on Admission: CT ABDOMEN PELVIS W CONTRAST  Result Date: 12/29/2019 CLINICAL DATA:  Abdominal and back pain. History of metastatic lung cancer. EXAM: CT ABDOMEN AND PELVIS WITH CONTRAST TECHNIQUE: Multidetector CT imaging of the abdomen and pelvis was performed using the standard protocol following bolus administration of intravenous contrast. CONTRAST:  32mL OMNIPAQUE IOHEXOL 300 MG/ML  SOLN COMPARISON:  CT scan 11/21/2019 and 11/07/2019. FINDINGS: Lower chest: Small right pleural effusion is slightly larger when compared to the prior study. Stable radiation changes involving the right lower lobe. No findings suspicious for recurrent tumor. No metastatic pulmonary lesions are identified. The heart is normal in size. No pericardial effusion. Hepatobiliary: Heterogeneous appearance of the liver parenchyma may be due to an inflammatory process/hepatitis. There is also progressive intrahepatic biliary dilatation and significant common bile duct dilatation. It measures approximately 14.5 mm in the porta hepatis and a maximum of 9 mm in the head of the pancreas. Could not exclude a obstructing common bile duct stone. No obvious gallstones. Pericholecystic fluid is noted. Pancreas: The pancreas is enlarged/prominent and the head region demonstrates diffuse low attenuation. This could represent decreased perfusion due to inflammation. Recommend correlation any clinical findings for pancreatitis. Low-attenuation lesions in the body/tail junction region are also noted may be inflammatory.  Spleen: Spleen is small.  No focal lesions. Adrenals/Urinary Tract: Adrenal glands are unremarkable. There are changes consistent with pyelonephritis involving the right kidney in particular most notably in the lower pole region. Is also a ureteral thickening and enhancement bilaterally suggesting Pyo ureteral nephrosis. No obvious bladder wall thickening or bladder mass. Stomach/Bowel: Stomach demonstrates mild uniform wall thickening mucosal enhancement. Similar findings involving the duodenum. Findings could be due to gastritis and duodenitis. No findings for small bowel obstruction colonic mass. Vascular/Lymphatic: The aorta and branch  vessels are patent. The major venous structures are patent. No mesenteric or retroperitoneal mass or adenopathy. Small scattered lymph nodes are stable. Reproductive: The uterus and ovaries are grossly normal. Other: Small to moderate amount of free abdominal/pelvic fluid and diffuse mesenteric edema. Musculoskeletal: Stable sclerotic osseous metastatic disease. No new or progressive findings. IMPRESSION: 1. Progressive intrahepatic and common bile duct dilatation. Could not exclude a obstructing common bile duct stone. Recommend correlation with with liver function studies. 2. CT findings consistent with pyelonephritis and pyoureteronephrosis. 3. Abnormal appearance of the pancreas as detailed above. Recommend correlation with amylase and lipase levels. Possible acute pancreatitis. 4. Small to moderate amount of free abdominal/pelvic fluid and diffuse mesenteric edema. 5. Stable sclerotic osseous metastatic disease. No new or progressive findings. 6. Stable radiation changes involving the right lower lobe. No findings suspicious for recurrent tumor. 7. Heterogeneous appearance of the liver parenchyma may be due to an inflammatory process/hepatitis. 8. Aortic atherosclerosis. Aortic Atherosclerosis (ICD10-I70.0). Electronically Signed   By: Marijo Sanes M.D.   On: 12/29/2019  16:19   VAS [place] LOWER EXTREMITY VENOUS (DVT) (ONLY [facility])  Result Date: 12/29/2019  Lower Venous DVT Study Indications: Swelling.  Risk Factors: Cancer. Comparison Study: No prior studies. Performing Technologist: Oliver Hum RVT  Examination Guidelines: A complete evaluation includes B-mode imaging, spectral Doppler, color Doppler, and power Doppler as needed of all accessible portions of each vessel. Bilateral testing is considered an integral part of a complete examination. Limited examinations for reoccurring indications may be performed as noted. The reflux portion of the exam is performed with the patient in reverse Trendelenburg.  +---------+---------------+---------+-----------+----------+--------------+ RIGHT    CompressibilityPhasicitySpontaneityPropertiesThrombus Aging +---------+---------------+---------+-----------+----------+--------------+ CFV      Full           Yes      Yes                                 +---------+---------------+---------+-----------+----------+--------------+ SFJ      None           No       No                   Acute          +---------+---------------+---------+-----------+----------+--------------+ FV Prox  Full                                                        +---------+---------------+---------+-----------+----------+--------------+ FV Mid   Full                                                        +---------+---------------+---------+-----------+----------+--------------+ FV DistalFull                                                        +---------+---------------+---------+-----------+----------+--------------+ PFV      Full                                                        +---------+---------------+---------+-----------+----------+--------------+  POP      Full           Yes      Yes                                 +---------+---------------+---------+-----------+----------+--------------+  PTV      Full                                                        +---------+---------------+---------+-----------+----------+--------------+ PERO     Full                                                        +---------+---------------+---------+-----------+----------+--------------+ GSV      None           No       No                   Acute          +---------+---------------+---------+-----------+----------+--------------+   +---------+---------------+---------+-----------+----------+--------------+ LEFT     CompressibilityPhasicitySpontaneityPropertiesThrombus Aging +---------+---------------+---------+-----------+----------+--------------+ CFV      Full           Yes      Yes                                 +---------+---------------+---------+-----------+----------+--------------+ SFJ      None           No       No                   Acute          +---------+---------------+---------+-----------+----------+--------------+ FV Prox  Full                                                        +---------+---------------+---------+-----------+----------+--------------+ FV Mid   Full                                                        +---------+---------------+---------+-----------+----------+--------------+ FV DistalFull                                                        +---------+---------------+---------+-----------+----------+--------------+ PFV      Full                                                        +---------+---------------+---------+-----------+----------+--------------+  POP      Full           Yes      Yes                                 +---------+---------------+---------+-----------+----------+--------------+ PTV      Full                                                        +---------+---------------+---------+-----------+----------+--------------+ PERO     Full                                                         +---------+---------------+---------+-----------+----------+--------------+ GSV      None           No       No                   Acute          +---------+---------------+---------+-----------+----------+--------------+     Summary: RIGHT: - Findings consistent with acute deep vein thrombosis involving the SF junction. - Findings consistent with acute superficial vein thrombosis involving the right great saphenous vein. - No cystic structure found in the popliteal fossa.  LEFT: - Findings consistent with acute deep vein thrombosis involving the SF junction. - Findings consistent with acute superficial vein thrombosis involving the left great saphenous vein. - No cystic structure found in the popliteal fossa.  *See table(s) above for measurements and observations.    Preliminary     EKG: Independently reviewed.  Sinus rhythm  Assessment/Plan  Principal Problem Hypochloremic hyponatremia -Patient looks intravascularly depleted despite a certain degree of third spacing.  This is likely due to poor p.o. intake over the last week in the setting of nausea. -She was hyponatremic last time she was hospitalized as well.  Provide gentle IV fluids overnight, recheck sodium in the morning  Active Problems Hypokalemia -Likely due to poor p.o. intake, replete aggressively and recheck in the morning -Magnesium normal  UTI / Pyelonephritis -Obtain urinalysis and urine cultures, she does have suprapubic tenderness as well as left flank pain.  CT also concerning for pyelonephritis.  Start ceftriaxone  Acute DVT -Patient has a history of DVT in March 2020 based on her lower extremity ultrasound.  She has been on Xarelto.  Last time she was hospitalized last month it appears that Xarelto has been discontinued on discharge, but reasons are unclear to me.  Given lower extremity swelling she had repeat lower extremity Doppler in the ED which was positive for acute DVT again.  Start  heparin infusion for now.  ?  Thyroid disease -Patient mentions that Dr. Earlie Server asked her to stop the thyroid medications, however she does not appear to have hypothyroidism.  Probably need to clarify this with Dr. Earlie Server in the morning.  Again, there appeared to be a degree of language barrier even between the interpreter and the patient  Anemia of chronic disease -Hemoglobin at baseline, no bleeding.  Thrombocytopenia -?  New, consult oncology in the morning.  Repeat CBC  Seizure disorder -Continue home  Keppra  Pancreatic "inflammation" -Lipase is normal, she has had findings like this on the CT scan before, thought to be post radiation therapy  Back pain -Likely secondary to metastatic cancer, continue home regimen  Progressive intrahepatic and CBD dilation -CT scan mentions that it could not exclude CBD.  LFTs are normal, she does not have any symptoms related to that.  Obtain ultrasound, based on results may need an MRI MRCP  Severe malnutrition/hypoalbuminemia -Likely causing a slight degree of third spacing.  DVT prophylaxis: heparin  Code Status: Full code  Family Communication: husband at bedside  Disposition Plan: home when ready  Bed Type: Telemetry  Consults called: none   Obs/Inp: Inpatient  At the time of admission, it appears that the appropriate admission status for this patient is INPATIENT as it is expected that patient will require hospital care > 2 midnights. This is judged to be reasonable and necessary in order to provide the required intensity of service to ensure the patient's safety given: presenting symptoms, initial radiographic and laboratory data and in the context of their chronic comorbidities. Together, these circumstances are felt to place patient at high at high risk for further clinical deterioration threatening life, limb, or organ.  Marzetta Board, MD, PhD [facility]  Contact via [email or web address]  12/29/2019, 5:29 PM

## 2019-12-29 NOTE — Progress Notes (Signed)
ANTICOAGULATION CONSULT NOTE - Initial Consult  Pharmacy Consult for IV heparin Indication: DVT  Allergies  Allergen Reactions  . Gabapentin Other (See Comments)    Abdominal pain  . Other Other (See Comments)    Patient states that there is some she takes that makes her throat "cry" AND SCRATCHY   . Tramadol Nausea And Vomiting    Lowers seizure threshold  . Doxycycline Nausea And Vomiting    Patient Measurements: Height: 4\' 11"  (149.9 cm) Weight: 36.2 kg (79 lb 14.4 oz) IBW/kg (Calculated) : 43.2 Heparin Dosing Weight: actual body weight   Vital Signs: Temp: 97.8 F (36.6 C) (12/14 0949) Temp Source: Oral (12/14 0949) BP: 111/75 (12/14 1900) Pulse Rate: 100 (12/14 1900)  Labs: Recent Labs    12/29/19 1035  HGB 10.1*  HCT 29.3*  PLT 63*  CREATININE 0.38*    Estimated Creatinine Clearance: 49.7 mL/min (A) (by C-G formula based on SCr of 0.38 mg/dL (L)).   Medical History: Past Medical History:  Diagnosis Date  . Bone cancer ([place])   . Bone metastasis ([place])   . Hemoptysis   . Hypokalemia   . lung ca dx'd 07/2014  . Lung mass   . Metastasis to adrenal gland ([place])   . Metastasis to brain ([place])   . Pneumonia   . Radiation 08/23/14-09/07/14   Brain/chest and left hip 30 Gy 12 Fx  . URI (upper respiratory infection) 02-02-2015    Assessment: 22 y/oF with PMH of stage IV metastatic breast cancer, DVT in 2020 who presented to [facility] ED with left-sided flank pain and nausea. Bilateral lower extremity venous duplex today + for acute superficial vein thrombosis involving the right and left great saphenous vein and SFJ. Pharmacy consulted for IV heparin dosing. Of note, pharmacy medication reconciliation technician confirmed with patient (with the help of patient's brother as an interpreter) that she is still taking Xarelto 20mg  PO daily (last dose 12/28/2019 at 1800). Baseline heparin level elevated, which likely confirms recent DOAC use. Admission CBC significant for new  thrombocytopenia-discussed with Lang Snow, NP with [facility], who approved use of IV heparin, but requested no bolus and targeting lower therapeutic range.    Goal of Therapy:  Heparin level 0.3-0.5 units/ml aPTT 66-84 seconds Monitor platelets by anticoagulation protocol: Yes   Plan:   Start heparin infusion at 600 units/hr  Will use aPTT for dose titration until aPTT and heparin level correlate, as baseline heparin level falsely elevated from recent DOAC use  APTT 6 hours after initiation  Daily CBC (monitor Pltc closely), heparin level, aPTT  Monitor closely for s/sx of bleeding  F/u Oncology workup/recommendations regarding thrombocytopenia    Lindell Spar, PharmD, BCPS Clinical Pharmacist 12/29/2019,8:18 PM

## 2019-12-29 NOTE — Progress Notes (Signed)
Call from pharmacy regarding concerns for use of heparin with platelets of 63.  Pt is slight and possibly had Xarelto yesterday per report to med tech.  Started heparin with no bolus and focus on lower end of therapeutic range.

## 2019-12-29 NOTE — Progress Notes (Signed)
Bilateral lower extremity venous duplex has been completed. Preliminary results can be found in CV Proc through chart review.  Results were given to Dr. Ron Parker.  12/29/19 11:03 AM Deborah Foster RVT

## 2019-12-29 NOTE — ED Notes (Signed)
Received critical lab value of Potassium: 2.3. Notified Katz,MD

## 2019-12-29 NOTE — ED Provider Notes (Signed)
[facility] DEPT Provider Note   CSN: [id number] Arrival date & time: 12/29/19  2542     History Chief Complaint  Patient presents with  . Abdominal Pain  . Weakness  . Back Pain    Deborah Foster is a 49 y.o. female.   Abdominal Pain Pain location:  LLQ Pain quality: aching   Pain radiates to:  Back Pain severity:  Severe Onset quality:  Gradual Timing:  Constant Progression:  Worsening Chronicity:  Chronic Context comment:  Met can to bones Relieved by:  Nothing Worsened by:  Movement and palpation Ineffective treatments: home pain meds. Weakness Associated symptoms: abdominal pain and arthralgias   Back Pain Associated symptoms: abdominal pain and weakness        Past Medical History:  Diagnosis Date  . Bone cancer ([place])   . Bone metastasis ([place])   . Hemoptysis   . Hypokalemia   . lung ca dx'd 07/2014  . Lung mass   . Metastasis to adrenal gland ([place])   . Metastasis to brain ([place])   . Pneumonia   . Radiation 08/23/14-09/07/14   Brain/chest and left hip 30 Gy 12 Fx  . URI (upper respiratory infection) 02-02-2015    Patient Active Problem List   Diagnosis Date Noted  . Dehydration   . Sinus tachycardia   . Constipation   . Hypokalemia   . Hypophosphatemia   . Hypocalcemia   . Metabolic acidosis   . Seizure disorder ([place])   . Hyponatremia 11/21/2019  . Nausea and vomiting 11/21/2019  . Back pain 11/21/2019  . Lung metastasis ([place]) 04/30/2019  . Goals of care, counseling/discussion 05/05/2018  . Epigastric pain 05/05/2018  . Blurry vision, bilateral 05/05/2018  . 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 Metastasis to supraclavicular lymph node ([place]) 04/08/2018  . Difficulty sleeping 03/20/2018  . Primary cancer of right lower lobe of lung ([place]) 07/22/2015  . Malnutrition of moderate degree 07/21/2015  . Hemoptysis   . Primary lung adenocarcinoma ([place])   . Odynophagia 07/05/2015  . Folic acid  deficiency 06/06/2015  . B12 deficiency 06/06/2015  . Radiation pneumonitis with hemoptysis 06/06/2015  . Bone metastases ([place]) 01/20/2015  . Malignant neoplasm metastatic to left adrenal gland ([place]) 01/20/2015  . Bronchitis 11/22/2014  . Encounter for antineoplastic chemotherapy 11/02/2014  . Mucositis 10/22/2014  . Rash 10/08/2014  . Non-small cell carcinoma of lung, stage 4 ([place]) 08/26/2014  . Bone disease, metabolic 70/62/3762  . Metastasis to brain ([place])   . Palliative care encounter   . Adrenal mass, left ([place])   . CAP (community acquired pneumonia) 08/07/2014  . Pneumonia 08/07/2014  . Lung mass 08/07/2014    Past Surgical History:  Procedure Laterality Date  . IR GENERIC HISTORICAL  07/14/2015   IR RADIOLOGIST EVAL & MGMT 07/14/2015 Greggory Keen, MD [facility] INTERV RAD  . VIDEO BRONCHOSCOPY Bilateral 08/09/2014   Procedure: VIDEO BRONCHOSCOPY WITHOUT FLUORO;  Surgeon: [name], MD;  Location: [facility] ENDOSCOPY;  Service: Cardiopulmonary;  Laterality: Bilateral;  . VIDEO BRONCHOSCOPY Bilateral 07/06/2015   Procedure: VIDEO BRONCHOSCOPY WITHOUT FLUORO;  Surgeon: [name], MD;  Location: [facility] ENDOSCOPY;  Service: Cardiopulmonary;  Laterality: Bilateral;     OB History   No obstetric history on file.     Family History  Problem Relation Age of Onset  . Hyperlipidemia Mother     Social History   Tobacco Use  . Smoking status: Never Smoker  . Smokeless tobacco: Never  Used  Vaping Use  . Vaping Use: Never used  Substance Use Topics  . Alcohol use: No    Alcohol/week: 0.0 standard drinks  . Drug use: No    Home Medications Prior to Admission medications   Medication Sig Start Date End Date Taking? Authorizing Provider  calcium-vitamin D (OSCAL WITH D) 500-200 MG-UNIT tablet Take 1 tablet by mouth 3 (three) times daily. 11/27/19  Yes Eugenie Filler, MD  Cyanocobalamin (VITAMIN B-12) 2500 MCG SUBL Place 2,500 mcg under the tongue daily.   Yes [provider]  diclofenac Sodium (VOLTAREN) 1 % GEL Apply 2 g topically 4 (four) times daily. 11/27/19  Yes Eugenie Filler, MD  ferrous sulfate 325 (65 FE) MG tablet Take 325 mg by mouth daily with breakfast.   Yes [provider]  folic acid (FOLVITE) 765 MCG tablet Take 800 mcg by mouth daily.   Yes [provider]  furosemide (LASIX) 80 MG tablet Take 40-80 mg by mouth daily as needed for fluid. 12/14/19  Yes [provider]  gabapentin (NEURONTIN) 300 MG capsule Take 600 mg by mouth 2 (two) times daily. 12/16/19  Yes [provider]  HYDROmorphone (DILAUDID) 4 MG tablet Take 1 tablet (4 mg total) by mouth every 6 (six) hours as needed for severe pain. 11/27/19  Yes Acquanetta Chain, DO  lactulose (CHRONULAC) 10 GM/15ML solution Take 30 mLs (20 g total) by mouth 2 (two) times daily as needed for moderate constipation. 11/30/19  Yes Tanner, Lyndon Code., PA-C  levETIRAcetam (KEPPRA) 250 MG tablet Take 1 tablet (250 mg total) by mouth 2 (two) times daily. 07/02/19  Yes Vaslow, Acey Lav, MD  loratadine (CLARITIN) 10 MG tablet Take 1 tablet (10 mg total) by mouth daily. Patient taking differently: Take 10 mg by mouth daily as needed for allergies. 04/30/16  Yes [name], MD  morphine (MS CONTIN) 30 MG 12 hr tablet Take 1 tablet (30 mg total) by mouth every 12 (twelve) hours. 12/08/19  Yes [name], MD  ondansetron (ZOFRAN) 8 MG tablet Take 8 mg by mouth every 8 (eight) hours as needed for nausea or vomiting.  06/30/19  Yes [provider]  polyethylene glycol (MIRALAX / GLYCOLAX) 17 g packet Take 17 g by mouth 2 (two) times daily. 11/27/19  Yes Eugenie Filler, MD  senna-docusate (SENOKOT-S) 8.6-50 MG tablet Take 1 tablet by mouth 2 (two) times daily. 11/27/19  Yes Eugenie Filler, MD  TAGRISSO 80 MG tablet TAKE 1 TABLET (80 MG TOTAL) BY MOUTH DAILY. Patient taking differently: Take 80 mg by mouth daily. 04/28/19  Yes [name],  MD  XARELTO 20 MG TABS tablet Take 20 mg by mouth daily at 6 (six) AM. 12/03/19  Yes [provider]  dexamethasone (DECADRON) 4 MG tablet Take 1 tablet (4 mg total) by mouth every 12 (twelve) hours. Patient not taking: No sig reported 11/27/19   Acquanetta Chain, DO    Allergies    Gabapentin, Other, Tramadol, and Doxycycline  Review of Systems   Review of Systems  Gastrointestinal: Positive for abdominal pain.  Musculoskeletal: Positive for arthralgias and back pain.  Neurological: Positive for weakness.    Physical Exam Updated Vital Signs BP 99/65   Pulse (!) 105   Temp 97.8 F (36.6 C) (Oral)   Resp 16   Ht $R'4\' 11"'Ei$  (1.499 m)   Wt 36.2 kg   SpO2 100%   BMI 16.14 kg/m   Physical Exam Vitals  and nursing note reviewed. Exam conducted with a chaperone present.  Constitutional:      General: She is not in acute distress.    Appearance: Normal appearance.  HENT:     Head: Normocephalic and atraumatic.     Nose: No rhinorrhea.  Eyes:     General:        Right eye: No discharge.        Left eye: No discharge.     Conjunctiva/sclera: Conjunctivae normal.  Cardiovascular:     Rate and Rhythm: Normal rate and regular rhythm.  Pulmonary:     Effort: Pulmonary effort is normal. No respiratory distress.     Breath sounds: No stridor.  Abdominal:     General: Abdomen is flat. There is no distension.     Palpations: Abdomen is soft.     Tenderness: There is generalized abdominal tenderness and tenderness in the suprapubic area and left lower quadrant. There is guarding.  Musculoskeletal:        General: No tenderness or signs of injury.     Comments: Decreased range of motion of bilateral lower extremities, swelling of the left foot and distal leg.  Skin:    General: Skin is warm and dry.     Findings: Rash present.     Comments: Blanching erythematous changes to the lower abdomen no tenderness to palpation no induration  Neurological:     General: No focal  deficit present.     Mental Status: She is alert. Mental status is at baseline.     Motor: No weakness.  Psychiatric:        Mood and Affect: Mood normal.        Behavior: Behavior normal.     ED Results / Procedures / Treatments   Labs (all labs ordered are listed, but only abnormal results are displayed) Labs Reviewed  BASIC METABOLIC PANEL - Abnormal; Notable for the following components:      Result Value   Sodium 120 (*)    Potassium 2.3 (*)    Chloride 71 (*)    CO2 36 (*)    Glucose, Bld 116 (*)    Creatinine, Ser 0.38 (*)    Calcium 7.8 (*)    All other components within normal limits  CBC - Abnormal; Notable for the following components:   RBC 3.43 (*)    Hemoglobin 10.1 (*)    HCT 29.3 (*)    Platelets 63 (*)    All other components within normal limits  HEPATIC FUNCTION PANEL - Abnormal; Notable for the following components:   Total Protein 5.3 (*)    Albumin 2.4 (*)    All other components within normal limits  CBG MONITORING, ED - Abnormal; Notable for the following components:   Glucose-Capillary 116 (*)    All other components within normal limits  LIPASE, BLOOD  LACTIC ACID, PLASMA  MAGNESIUM  PHOSPHORUS  URINALYSIS, ROUTINE W REFLEX MICROSCOPIC  LACTIC ACID, PLASMA    EKG EKG Interpretation  Date/Time:  Tuesday December 29 2019 09:54:46 EST Ventricular Rate:  103 PR Interval:    QRS Duration: 94 QT Interval:  442 QTC Calculation: 579 R Axis:   83 Text Interpretation: Sinus tachycardia Consider right atrial enlargement RSR' in V1 or V2, probably normal variant Prolonged QT interval Confirmed by Dewaine Conger [id number]) on 12/29/2019 11:41:25 AM   Radiology VAS [place] LOWER EXTREMITY VENOUS (DVT) (ONLY [facility])  Result Date: 12/29/2019  Lower Venous DVT Study Indications: Swelling.  Risk Factors: Cancer. Comparison Study: No prior studies. Performing Technologist: Oliver Hum RVT  Examination Guidelines: A complete evaluation includes B-mode  imaging, spectral Doppler, color Doppler, and power Doppler as needed of all accessible portions of each vessel. Bilateral testing is considered an integral part of a complete examination. Limited examinations for reoccurring indications may be performed as noted. The reflux portion of the exam is performed with the patient in reverse Trendelenburg.  +---------+---------------+---------+-----------+----------+--------------+ RIGHT    CompressibilityPhasicitySpontaneityPropertiesThrombus Aging +---------+---------------+---------+-----------+----------+--------------+ CFV      Full           Yes      Yes                                 +---------+---------------+---------+-----------+----------+--------------+ SFJ      None           No       No                   Acute          +---------+---------------+---------+-----------+----------+--------------+ FV Prox  Full                                                        +---------+---------------+---------+-----------+----------+--------------+ FV Mid   Full                                                        +---------+---------------+---------+-----------+----------+--------------+ FV DistalFull                                                        +---------+---------------+---------+-----------+----------+--------------+ PFV      Full                                                        +---------+---------------+---------+-----------+----------+--------------+ POP      Full           Yes      Yes                                 +---------+---------------+---------+-----------+----------+--------------+ PTV      Full                                                        +---------+---------------+---------+-----------+----------+--------------+ PERO     Full                                                        +---------+---------------+---------+-----------+----------+--------------+  GSV      None           No       No                   Acute          +---------+---------------+---------+-----------+----------+--------------+   +---------+---------------+---------+-----------+----------+--------------+ LEFT     CompressibilityPhasicitySpontaneityPropertiesThrombus Aging +---------+---------------+---------+-----------+----------+--------------+ CFV      Full           Yes      Yes                                 +---------+---------------+---------+-----------+----------+--------------+ SFJ      None           No       No                   Acute          +---------+---------------+---------+-----------+----------+--------------+ FV Prox  Full                                                        +---------+---------------+---------+-----------+----------+--------------+ FV Mid   Full                                                        +---------+---------------+---------+-----------+----------+--------------+ FV DistalFull                                                        +---------+---------------+---------+-----------+----------+--------------+ PFV      Full                                                        +---------+---------------+---------+-----------+----------+--------------+ POP      Full           Yes      Yes                                 +---------+---------------+---------+-----------+----------+--------------+ PTV      Full                                                        +---------+---------------+---------+-----------+----------+--------------+ PERO     Full                                                        +---------+---------------+---------+-----------+----------+--------------+  GSV      None           No       No                   Acute          +---------+---------------+---------+-----------+----------+--------------+     Summary: RIGHT: - Findings consistent with  acute deep vein thrombosis involving the SF junction. - Findings consistent with acute superficial vein thrombosis involving the right great saphenous vein. - No cystic structure found in the popliteal fossa.  LEFT: - Findings consistent with acute deep vein thrombosis involving the SF junction. - Findings consistent with acute superficial vein thrombosis involving the left great saphenous vein. - No cystic structure found in the popliteal fossa.  *See table(s) above for measurements and observations.    Preliminary     Procedures Procedures (including critical care time)  Medications Ordered in ED Medications  potassium chloride 10 mEq in 100 mL IVPB (10 mEq Intravenous New Bag/Given 12/29/19 1513)  sodium chloride (PF) 0.9 % injection (has no administration in time range)  morphine 4 MG/ML injection 4 mg (4 mg Intravenous Given 12/29/19 1033)  lactated ringers bolus 500 mL (0 mLs Intravenous Stopped 12/29/19 1513)  morphine 4 MG/ML injection 4 mg (4 mg Intravenous Given 12/29/19 1403)  iohexol (OMNIPAQUE) 300 MG/ML solution 100 mL (75 mLs Intravenous Contrast Given 12/29/19 1501)  sodium chloride 0.9 % bolus 500 mL (500 mLs Intravenous New Bag/Given 12/29/19 1512)    ED Course  I have reviewed the triage vital signs and the nursing notes.  Pertinent labs & imaging results that were available during my care of the patient were reviewed by me and considered in my medical decision making (see chart for details).    MDM Rules/Calculators/A&P                          Metastatic cancer, worsening pain worsening ability to regulate worsening belly pain.  No fevers chills nausea vomiting.  Pain control offered her screening labs imaging will be obtained.  Patient has significant hyponatremia hypokalemia hypochloremia, creatinine is stable, ultrasound of the lower extremities with concern for some swelling in the left foot does show venous thrombosis of the superficial at junctions with deep  veins concerning for DVT. Appears she is already on anticoagulation. She will get IV replacement of the electrolytes she is lacking she will need admission for further treatment still pending CT imaging.  Patient is anticoagulated.  To receive lactated Ringer's to help correct sodium and potassium, received potassium chloride, receive additional sodium chloride to correct sodium.  This patient will need further electrolyte correction IV hydration and evaluation.  Still pending CT scan.  Pt care was handed off to on coming provider at 1530.  Complete history and physical and current plan have been communicated.  Please refer to their note for the remainder of ED care and ultimate disposition.  Pt seen in conjunction with Dr. Roderic Palau     Final Clinical Impression(s) / ED Diagnoses Final diagnoses:  Hyponatremia  Hypokalemia    Rx / DC Orders ED Discharge Orders    None       Breck Coons, MD 12/29/19 1517

## 2019-12-30 ENCOUNTER — Encounter (HOSPITAL_COMMUNITY): Payer: Self-pay | Admitting: Internal Medicine

## 2019-12-30 ENCOUNTER — Ambulatory Visit (HOSPITAL_COMMUNITY): Admission: RE | Admit: 2019-12-30 | Payer: Medicare Other | Source: Ambulatory Visit

## 2019-12-30 DIAGNOSIS — B9561 Methicillin susceptible Staphylococcus aureus infection as the cause of diseases classified elsewhere: Secondary | ICD-10-CM | POA: Diagnosis present

## 2019-12-30 DIAGNOSIS — E876 Hypokalemia: Secondary | ICD-10-CM | POA: Diagnosis present

## 2019-12-30 DIAGNOSIS — R7989 Other specified abnormal findings of blood chemistry: Secondary | ICD-10-CM

## 2019-12-30 DIAGNOSIS — Z83438 Family history of other disorder of lipoprotein metabolism and other lipidemia: Secondary | ICD-10-CM | POA: Diagnosis not present

## 2019-12-30 DIAGNOSIS — C7951 Secondary malignant neoplasm of bone: Secondary | ICD-10-CM | POA: Diagnosis present

## 2019-12-30 DIAGNOSIS — E878 Other disorders of electrolyte and fluid balance, not elsewhere classified: Secondary | ICD-10-CM | POA: Diagnosis present

## 2019-12-30 DIAGNOSIS — Z681 Body mass index (BMI) 19 or less, adult: Secondary | ICD-10-CM | POA: Diagnosis not present

## 2019-12-30 DIAGNOSIS — N12 Tubulo-interstitial nephritis, not specified as acute or chronic: Secondary | ICD-10-CM | POA: Diagnosis present

## 2019-12-30 DIAGNOSIS — E861 Hypovolemia: Secondary | ICD-10-CM | POA: Diagnosis present

## 2019-12-30 DIAGNOSIS — E43 Unspecified severe protein-calorie malnutrition: Secondary | ICD-10-CM | POA: Diagnosis present

## 2019-12-30 DIAGNOSIS — R21 Rash and other nonspecific skin eruption: Secondary | ICD-10-CM | POA: Diagnosis present

## 2019-12-30 DIAGNOSIS — I82813 Embolism and thrombosis of superficial veins of lower extremities, bilateral: Secondary | ICD-10-CM | POA: Diagnosis present

## 2019-12-30 DIAGNOSIS — Z7901 Long term (current) use of anticoagulants: Secondary | ICD-10-CM | POA: Diagnosis not present

## 2019-12-30 DIAGNOSIS — Z79899 Other long term (current) drug therapy: Secondary | ICD-10-CM | POA: Diagnosis not present

## 2019-12-30 DIAGNOSIS — C50919 Malignant neoplasm of unspecified site of unspecified female breast: Secondary | ICD-10-CM | POA: Diagnosis present

## 2019-12-30 DIAGNOSIS — Z86718 Personal history of other venous thrombosis and embolism: Secondary | ICD-10-CM | POA: Diagnosis not present

## 2019-12-30 DIAGNOSIS — D696 Thrombocytopenia, unspecified: Secondary | ICD-10-CM | POA: Diagnosis present

## 2019-12-30 DIAGNOSIS — K853 Drug induced acute pancreatitis without necrosis or infection: Secondary | ICD-10-CM | POA: Diagnosis present

## 2019-12-30 DIAGNOSIS — Y842 Radiological procedure and radiotherapy as the cause of abnormal reaction of the patient, or of later complication, without mention of misadventure at the time of the procedure: Secondary | ICD-10-CM | POA: Diagnosis present

## 2019-12-30 DIAGNOSIS — E871 Hypo-osmolality and hyponatremia: Secondary | ICD-10-CM | POA: Diagnosis present

## 2019-12-30 DIAGNOSIS — G40909 Epilepsy, unspecified, not intractable, without status epilepticus: Secondary | ICD-10-CM | POA: Diagnosis present

## 2019-12-30 DIAGNOSIS — Z20822 Contact with and (suspected) exposure to covid-19: Secondary | ICD-10-CM | POA: Diagnosis present

## 2019-12-30 DIAGNOSIS — C3490 Malignant neoplasm of unspecified part of unspecified bronchus or lung: Secondary | ICD-10-CM | POA: Diagnosis present

## 2019-12-30 LAB — COMPREHENSIVE METABOLIC PANEL
ALT: 14 U/L (ref 0–44)
AST: 25 U/L (ref 15–41)
Albumin: 2.2 g/dL — ABNORMAL LOW (ref 3.5–5.0)
Alkaline Phosphatase: 74 U/L (ref 38–126)
Anion gap: 13 (ref 5–15)
BUN: 9 mg/dL (ref 6–20)
CO2: 28 mmol/L (ref 22–32)
Calcium: 6.9 mg/dL — ABNORMAL LOW (ref 8.9–10.3)
Chloride: 81 mmol/L — ABNORMAL LOW (ref 98–111)
Creatinine, Ser: <0.3 mg/dL — ABNORMAL LOW (ref 0.44–1.00)
Glucose, Bld: 87 mg/dL (ref 70–99)
Potassium: 3 mmol/L — ABNORMAL LOW (ref 3.5–5.1)
Sodium: 122 mmol/L — ABNORMAL LOW (ref 135–145)
Total Bilirubin: 1.1 mg/dL (ref 0.3–1.2)
Total Protein: 4.7 g/dL — ABNORMAL LOW (ref 6.5–8.1)

## 2019-12-30 LAB — BASIC METABOLIC PANEL
Anion gap: 14 (ref 5–15)
BUN: 9 mg/dL (ref 6–20)
CO2: 23 mmol/L (ref 22–32)
Calcium: 6.6 mg/dL — ABNORMAL LOW (ref 8.9–10.3)
Chloride: 87 mmol/L — ABNORMAL LOW (ref 98–111)
Creatinine, Ser: 0.4 mg/dL — ABNORMAL LOW (ref 0.44–1.00)
GFR, Estimated: >60 mL/min (ref >60)
Glucose, Bld: 84 mg/dL (ref 70–99)
Potassium: 3.7 mmol/L (ref 3.5–5.1)
Sodium: 124 mmol/L — ABNORMAL LOW (ref 135–145)

## 2019-12-30 LAB — NA AND K (SODIUM & POTASSIUM), RAND UR
Potassium Urine: 57 mmol/L
Sodium, Ur: 20 mmol/L

## 2019-12-30 LAB — AMMONIA: Ammonia: 21 umol/L (ref 9–35)

## 2019-12-30 LAB — OSMOLALITY: Osmolality: 260 mOsm/kg — ABNORMAL LOW (ref 275–295)

## 2019-12-30 LAB — CBC
HCT: 26.2 % — ABNORMAL LOW (ref 36.0–46.0)
Hemoglobin: 9 g/dL — ABNORMAL LOW (ref 12.0–15.0)
MCH: 29.6 pg (ref 26.0–34.0)
MCHC: 34.4 g/dL (ref 30.0–36.0)
MCV: 86.2 fL (ref 80.0–100.0)
Platelets: 64 10*3/uL — ABNORMAL LOW (ref 150–400)
RBC: 3.04 MIL/uL — ABNORMAL LOW (ref 3.87–5.11)
RDW: 14.6 % (ref 11.5–15.5)
WBC: 7.4 10*3/uL (ref 4.0–10.5)
nRBC: 0 % (ref 0.0–0.2)

## 2019-12-30 LAB — HEPARIN LEVEL (UNFRACTIONATED): Heparin Unfractionated: 0.28 IU/mL — ABNORMAL LOW (ref 0.30–0.70)

## 2019-12-30 LAB — APTT: aPTT: 80 seconds — ABNORMAL HIGH (ref 24–36)

## 2019-12-30 LAB — OSMOLALITY, URINE: Osmolality, Ur: 627 mOsm/kg (ref 300–900)

## 2019-12-30 MED ORDER — HYDROCORTISONE (PERIANAL) 2.5 % EX CREA: TOPICAL_CREAM | CUTANEOUS | Status: DC | PRN

## 2019-12-30 MED ORDER — FENTANYL CITRATE (PF) 100 MCG/2ML IJ SOLN: 6.2500 ug | INTRAMUSCULAR | Status: DC | PRN

## 2019-12-30 MED ORDER — POTASSIUM CHLORIDE 10 MEQ/100ML IV SOLN
10.0000 meq | INTRAVENOUS | Status: AC
  Administered 2019-12-30 (×6): 10 meq via INTRAVENOUS
  Filled 2019-12-30 (×2): qty 100

## 2019-12-30 MED ORDER — ENOXAPARIN SODIUM 40 MG/0.4ML ~~LOC~~ SOLN
40.0000 mg | Freq: Two times a day (BID) | SUBCUTANEOUS | Status: DC
  Administered 2019-12-31 – 2020-01-04 (×9): 40 mg via SUBCUTANEOUS
  Filled 2019-12-30 (×9): qty 0.4

## 2019-12-30 MED ORDER — SODIUM CHLORIDE 0.9 % IV SOLN: INTRAVENOUS | Status: DC

## 2019-12-30 MED ORDER — ENOXAPARIN SODIUM 40 MG/0.4ML ~~LOC~~ SOLN
40.0000 mg | Freq: Once | SUBCUTANEOUS | Status: AC
  Administered 2019-12-30: 16:00:00 40 mg via SUBCUTANEOUS
  Filled 2019-12-30: qty 0.4

## 2019-12-30 NOTE — ED Notes (Signed)
Lab called and said APTT hemolyzed. Will redraw

## 2019-12-30 NOTE — Progress Notes (Signed)
PROGRESS NOTE    Deborah Foster  TIW:[id number] DOB: 08-12-1970 DOA: 12/29/2019 PCP: [name], MD   Brief Narrative: Taken from H&P. Deborah Foster is a 49 y.o. female with medical history significant of stage IV metastatic breast cancer follows with Dr. Julien Nordmann, prior treatments with radiotherapy to the brain as well as metastatic bone lesions in the pelvis, status post Tarceva, currently on Tagrisso, Xgeva.  Patient has also been complaining of lower suprapubic abdominal pain along with nausea.  She was also having some difficulty with urination.  CT abdomen with progressive intrahepatic and CBD dye lesion, and questioning CBD stone and findings concerning for pyelonephritis and acute pancreatitis.  Also found to have bilateral lower extremity superficial venous thrombosis, no DVT.  He was started on heparin infusion, after talking with oncology transition to Lovenox and she will need it for 3 to 6 weeks. Also found to have significant hyponatremia.  Subjective: Patient is a veitnamese speaking lady, but able to communicate some work.  Unable to get someone for interpretation.  She was complaining of back and thigh pain.  Assessment & Plan:   Active Problems:   Hyponatremia  Hyponatremia.  Labs consistent with hypovolemic hyponatremia, may be some element of paraneoplastic.  Sodium with some improvement 122 this morning after getting 500 cc bolus of normal saline.  She was not on any other IV fluid. -Start her on normal saline 100 mL/h. -Monitor sodium. -Goal correction 8-10 in 24-hour.  Hypokalemia and hypomagnesemia.  Magnesium within normal limit.  Potassium improved to 3. -Repeat potassium and monitor  UTI/pyelonephritis.  CT abdomen concerning for pyelonephritis.  Urine cultures pending.  She was started on ceftriaxone. -Continue with ceftriaxone. -Follow-up urine cultures  Superficial venous thrombosis.  Doppler studies with superficial venous thrombosis involving both  lower extremities, no DVT.  She was started on heparin infusion.  Discussed with Dr. Julien Nordmann at oncology and he advised to transition to Lovenox, she will need Lovenox for 3 to 6 weeks. -Discontinue heparin. -Her on Lovenox  Anemia of chronic disease.  Hemoglobin at 9 today. -Continue to monitor  Thrombocytopenia.  No active sign of bleeding. Currently seems stable. -Continue to monitor.  Seizure disorder.  No acute concern. -Continue home dose of Keppra.  CT abdomen with pancreatic inflammation.  Lipase within normal limit.  She had similar findings on CT scan before.  Thought to be secondary to radiation therapy.  No abdominal pain.  Progressive intrahepatic and CBD dilation.  RUQ ultrasound with CBD but no stone.  No pain, nausea or vomiting. -Can obtain GI consult if needed.  Severe malnutrition/hypoalbuminemia.  With advanced cancer and poor p.o. intake. -Dietary consult.  Objective: Vitals:   12/30/19 0700 12/30/19 0730 12/30/19 0840 12/30/19 1341  BP: 108/70 111/73 108/65 108/65  Pulse: (!) 107 (!) 104 100 (!) 108  Resp: 17 14 17 16   Temp:  97.9 F (36.6 C)  98.7 F (37.1 C)  TempSrc:      SpO2: 100% 96% 100% 100%  Weight:      Height:        Intake/Output Summary (Last 24 hours) at 12/30/2019 1531 Last data filed at 12/29/2019 1823 Gross per 24 hour  Intake 1000 ml  Output --  Net 1000 ml   Filed Weights   12/29/19 0942  Weight: 36.2 kg    Examination:  General exam: Emaciated lady, appears calm and comfortable  Respiratory system: Clear to auscultation. Respiratory effort normal. Cardiovascular system: S1 & S2 heard,  RRR.  Gastrointestinal system: Soft, nontender, nondistended, bowel sounds positive. Central nervous system: Alert and oriented. No focal neurological deficits. Extremities: No edema, no cyanosis, pulses intact and symmetrical. Skin: No rashes, lesions or ulcers Psychiatry: Judgement and insight appear normal.     DVT prophylaxis:  Lovenox Code Status: Full Family Communication: Discussed with patient Disposition Plan:  Status is: Inpatient  Remains inpatient appropriate because:Inpatient level of care appropriate due to severity of illness   Dispo: The patient is from: Home              Anticipated d/c is to: Home              Anticipated d/c date is: 1 day              Patient currently is not medically stable to d/c.    Consultants:   Oncology  Procedures:  Antimicrobials:  Ceftriaxone  Data Reviewed: I have personally reviewed following labs and imaging studies  CBC: Recent Labs  Lab 12/29/19 1035 12/30/19 0448  WBC 6.4 7.4  HGB 10.1* 9.0*  HCT 29.3* 26.2*  MCV 85.4 86.2  PLT 63* 64*   Basic Metabolic Panel: Recent Labs  Lab 12/29/19 1035 12/30/19 0448  NA 120* 122*  K 2.3* 3.0*  CL 71* 81*  CO2 36* 28  GLUCOSE 116* 87  BUN 13 9  CREATININE 0.38* <0.30*  CALCIUM 7.8* 6.9*  MG 2.2  --   PHOS 2.7  --    GFR: CrCl cannot be calculated (This lab value cannot be used to calculate CrCl because it is not a number: <0.30). Liver Function Tests: Recent Labs  Lab 12/29/19 1035 12/30/19 0448  AST 22 25  ALT 13 14  ALKPHOS 82 74  BILITOT 0.6 1.1  PROT 5.3* 4.7*  ALBUMIN 2.4* 2.2*   Recent Labs  Lab 12/29/19 1035  LIPASE 17   Recent Labs  Lab 12/30/19 0032  AMMONIA 21   Coagulation Profile: Recent Labs  Lab 12/29/19 2040  INR 1.4*   Cardiac Enzymes: No results for input(s): CKTOTAL, CKMB, CKMBINDEX, TROPONINI in the last 168 hours. BNP (last 3 results) No results for input(s): PROBNP in the last 8760 hours. HbA1C: No results for input(s): HGBA1C in the last 72 hours. CBG: Recent Labs  Lab 12/29/19 1011  GLUCAP 116*   Lipid Profile: No results for input(s): CHOL, HDL, LDLCALC, TRIG, CHOLHDL, LDLDIRECT in the last 72 hours. Thyroid Function Tests: No results for input(s): TSH, T4TOTAL, FREET4, T3FREE, THYROIDAB in the last 72 hours. Anemia Panel: No results  for input(s): VITAMINB12, FOLATE, FERRITIN, TIBC, IRON, RETICCTPCT in the last 72 hours. Sepsis Labs: Recent Labs  Lab 12/29/19 1035  LATICACIDVEN 1.0    Recent Results (from the past 240 hour(s))  Resp Panel by RT-PCR (Flu A&B, Covid) Nasopharyngeal Swab     Status: None   Collection Time: 12/29/19  5:13 PM   Specimen: Nasopharyngeal Swab; Nasopharyngeal(NP) swabs in vial transport medium  Result Value Ref Range Status   SARS Coronavirus 2 by RT PCR NEGATIVE NEGATIVE Final    Comment: (NOTE) SARS-CoV-2 target nucleic acids are NOT DETECTED.  The SARS-CoV-2 RNA is generally detectable in upper respiratory specimens during the acute phase of infection. The lowest concentration of SARS-CoV-2 viral copies this assay can detect is 138 copies/mL. A negative result does not preclude SARS-Cov-2 infection and should not be used as the sole basis for treatment or other patient management decisions. A negative result may occur with  improper specimen collection/handling, submission of specimen other than nasopharyngeal swab, presence of viral mutation(s) within the areas targeted by this assay, and inadequate number of viral copies(<138 copies/mL). A negative result must be combined with clinical observations, patient history, and epidemiological information. The expected result is Negative.  Fact Sheet for Patients:  EntrepreneurPulse.com.au  Fact Sheet for Healthcare Providers:  IncredibleEmployment.be  This test is no t yet approved or cleared by the [place] FDA and  has been authorized for detection and/or diagnosis of SARS-CoV-2 by FDA under an Emergency Use Authorization (EUA). This EUA will remain  in effect (meaning this test can be used) for the duration of the COVID-19 declaration under Section 564(b)(1) of the Act, 21 U.S.C.section 360bbb-3(b)(1), unless the authorization is terminated  or revoked sooner.       Influenza A by PCR  NEGATIVE NEGATIVE Final   Influenza B by PCR NEGATIVE NEGATIVE Final    Comment: (NOTE) The Xpert Xpress SARS-CoV-2/FLU/RSV plus assay is intended as an aid in the diagnosis of influenza from Nasopharyngeal swab specimens and should not be used as a sole basis for treatment. Nasal washings and aspirates are unacceptable for Xpert Xpress SARS-CoV-2/FLU/RSV testing.  Fact Sheet for Patients: EntrepreneurPulse.com.au  Fact Sheet for Healthcare Providers: IncredibleEmployment.be  This test is not yet approved or cleared by the [place] FDA and has been authorized for detection and/or diagnosis of SARS-CoV-2 by FDA under an Emergency Use Authorization (EUA). This EUA will remain in effect (meaning this test can be used) for the duration of the COVID-19 declaration under Section 564(b)(1) of the Act, 21 U.S.C. section 360bbb-3(b)(1), unless the authorization is terminated or revoked.  Performed at [facility], [place] [address], [place], [place] [address]      Radiology Studies: CT ABDOMEN PELVIS W CONTRAST  Result Date: 12/29/2019 CLINICAL DATA:  Abdominal and back pain. History of metastatic lung cancer. EXAM: CT ABDOMEN AND PELVIS WITH CONTRAST TECHNIQUE: Multidetector CT imaging of the abdomen and pelvis was performed using the standard protocol following bolus administration of intravenous contrast. CONTRAST:  49mL OMNIPAQUE IOHEXOL 300 MG/ML  SOLN COMPARISON:  CT scan 11/21/2019 and 11/07/2019. FINDINGS: Lower chest: Small right pleural effusion is slightly larger when compared to the prior study. Stable radiation changes involving the right lower lobe. No findings suspicious for recurrent tumor. No metastatic pulmonary lesions are identified. The heart is normal in size. No pericardial effusion. Hepatobiliary: Heterogeneous appearance of the liver parenchyma may be due to an inflammatory process/hepatitis. There is also  progressive intrahepatic biliary dilatation and significant common bile duct dilatation. It measures approximately 14.5 mm in the porta hepatis and a maximum of 9 mm in the head of the pancreas. Could not exclude a obstructing common bile duct stone. No obvious gallstones. Pericholecystic fluid is noted. Pancreas: The pancreas is enlarged/prominent and the head region demonstrates diffuse low attenuation. This could represent decreased perfusion due to inflammation. Recommend correlation any clinical findings for pancreatitis. Low-attenuation lesions in the body/tail junction region are also noted may be inflammatory. Spleen: Spleen is small.  No focal lesions. Adrenals/Urinary Tract: Adrenal glands are unremarkable. There are changes consistent with pyelonephritis involving the right kidney in particular most notably in the lower pole region. Is also a ureteral thickening and enhancement bilaterally suggesting Pyo ureteral nephrosis. No obvious bladder wall thickening or bladder mass. Stomach/Bowel: Stomach demonstrates mild uniform wall thickening mucosal enhancement. Similar findings involving the duodenum. Findings could be due to gastritis and duodenitis. No findings for  small bowel obstruction colonic mass. Vascular/Lymphatic: The aorta and branch vessels are patent. The major venous structures are patent. No mesenteric or retroperitoneal mass or adenopathy. Small scattered lymph nodes are stable. Reproductive: The uterus and ovaries are grossly normal. Other: Small to moderate amount of free abdominal/pelvic fluid and diffuse mesenteric edema. Musculoskeletal: Stable sclerotic osseous metastatic disease. No new or progressive findings. IMPRESSION: 1. Progressive intrahepatic and common bile duct dilatation. Could not exclude a obstructing common bile duct stone. Recommend correlation with with liver function studies. 2. CT findings consistent with pyelonephritis and pyoureteronephrosis. 3. Abnormal appearance  of the pancreas as detailed above. Recommend correlation with amylase and lipase levels. Possible acute pancreatitis. 4. Small to moderate amount of free abdominal/pelvic fluid and diffuse mesenteric edema. 5. Stable sclerotic osseous metastatic disease. No new or progressive findings. 6. Stable radiation changes involving the right lower lobe. No findings suspicious for recurrent tumor. 7. Heterogeneous appearance of the liver parenchyma may be due to an inflammatory process/hepatitis. 8. Aortic atherosclerosis. Aortic Atherosclerosis (ICD10-I70.0). Electronically Signed   By: Marijo Sanes M.D.   On: 12/29/2019 16:19   VAS [place] LOWER EXTREMITY VENOUS (DVT) (ONLY [facility] & [facility])  Result Date: 12/29/2019  Lower Venous DVT Study Indications: Swelling.  Risk Factors: Cancer. Comparison Study: No prior studies. Performing Technologist: Oliver Hum RVT  Examination Guidelines: A complete evaluation includes B-mode imaging, spectral Doppler, color Doppler, and power Doppler as needed of all accessible portions of each vessel. Bilateral testing is considered an integral part of a complete examination. Limited examinations for reoccurring indications may be performed as noted. The reflux portion of the exam is performed with the patient in reverse Trendelenburg.  +---------+---------------+---------+-----------+----------+--------------+ RIGHT    CompressibilityPhasicitySpontaneityPropertiesThrombus Aging +---------+---------------+---------+-----------+----------+--------------+ CFV      Full           Yes      Yes                                 +---------+---------------+---------+-----------+----------+--------------+ SFJ      None           No       No                   Acute          +---------+---------------+---------+-----------+----------+--------------+ FV Prox  Full                                                         +---------+---------------+---------+-----------+----------+--------------+ FV Mid   Full                                                        +---------+---------------+---------+-----------+----------+--------------+ FV DistalFull                                                        +---------+---------------+---------+-----------+----------+--------------+ PFV      Full                                                        +---------+---------------+---------+-----------+----------+--------------+  POP      Full           Yes      Yes                                 +---------+---------------+---------+-----------+----------+--------------+ PTV      Full                                                        +---------+---------------+---------+-----------+----------+--------------+ PERO     Full                                                        +---------+---------------+---------+-----------+----------+--------------+ GSV      None           No       No                   Acute          +---------+---------------+---------+-----------+----------+--------------+   +---------+---------------+---------+-----------+----------+--------------+ LEFT     CompressibilityPhasicitySpontaneityPropertiesThrombus Aging +---------+---------------+---------+-----------+----------+--------------+ CFV      Full           Yes      Yes                                 +---------+---------------+---------+-----------+----------+--------------+ SFJ      None           No       No                   Acute          +---------+---------------+---------+-----------+----------+--------------+ FV Prox  Full                                                        +---------+---------------+---------+-----------+----------+--------------+ FV Mid   Full                                                         +---------+---------------+---------+-----------+----------+--------------+ FV DistalFull                                                        +---------+---------------+---------+-----------+----------+--------------+ PFV      Full                                                        +---------+---------------+---------+-----------+----------+--------------+  POP      Full           Yes      Yes                                 +---------+---------------+---------+-----------+----------+--------------+ PTV      Full                                                        +---------+---------------+---------+-----------+----------+--------------+ PERO     Full                                                        +---------+---------------+---------+-----------+----------+--------------+ GSV      None           No       No                   Acute          +---------+---------------+---------+-----------+----------+--------------+     Summary: RIGHT: - No DVT Right lower extremity. - Findings consistent with acute superficial vein thrombosis involving the right great saphenous vein and SFJ. - No cystic structure found in the popliteal fossa.  LEFT: - No DVT of Left lower extremity. - Findings consistent with acute superficial vein thrombosis involving the left great saphenous vein and SFJ. - No cystic structure found in the popliteal fossa.  *See table(s) above for measurements and observations. Electronically signed by Deitra Mayo MD on 12/29/2019 at 6:13:44 PM.    Final    US Abdomen Limited RUQ (LIVER/GB)  Result Date: 12/29/2019 CLINICAL DATA:  49 year old female with elevated LFTs. History of lung cancer. EXAM: ULTRASOUND ABDOMEN LIMITED RIGHT UPPER QUADRANT COMPARISON:  CT abdomen pelvis dated 12/29/2019. FINDINGS: Gallbladder: No gallstone. There is diffuse thickened appearance of the gallbladder wall measuring 5 mm in thickness, likely related to  ascites. Negative sonographic Murphy's sign. Common bile duct: Diameter: 18 mm. The common bile duct is dilated. No echogenic stone noted within the visualized CBD. Liver: There is coarsened appearance of the liver, likely early changes of cirrhosis. Clinical correlation is recommended. The liver demonstrates a normal echogenicity. Portal vein is patent on color Doppler imaging with normal direction of blood flow towards the liver. Other: Small ascites and partially visualized small right pleural effusion. IMPRESSION: 1. Dilated CBD. No stone identified within the gallbladder or visualized CBD. 2. Coarsened liver.  Correlation with LFTs recommended. 3. Small ascites and partially visualized right pleural effusion. Electronically Signed   By: Anner Crete M.D.   On: 12/29/2019 19:42    Scheduled Meds: . [START ON 12/31/2019] enoxaparin (LOVENOX) injection  40 mg Subcutaneous Q12H  . enoxaparin (LOVENOX) injection  40 mg Subcutaneous Once  . ferrous sulfate  325 mg Oral Q breakfast  . gabapentin  600 mg Oral BID  . levETIRAcetam  250 mg Oral BID  . morphine  30 mg Oral Q12H  . polyethylene glycol  17 g Oral BID  . senna-docusate  1 tablet Oral BID   Continuous Infusions: . cefTRIAXone (ROCEPHIN)  IV Stopped (12/29/19 1823)  .  potassium chloride 10 mEq (12/30/19 1337)     LOS: 0 days   Time spent: 40 minutes  Lorella Nimrod, MD [facility]  If 7PM-7AM, please contact night-coverage [email or web address]  12/30/2019, 3:31 PM   This record has been created using Systems analyst. Errors have been sought and corrected,but may not always be located. Such creation errors do not reflect on the standard of care.

## 2019-12-30 NOTE — Progress Notes (Addendum)
ANTICOAGULATION CONSULT NOTE - Follow Up Consult  Pharmacy Consult for IV heparin Indication: Acute DVT with hx DVT in March 2020  Allergies  Allergen Reactions  . Gabapentin Other (See Comments)    Abdominal pain  . Other Other (See Comments)    Patient states that there is some she takes that makes her throat "cry" AND SCRATCHY   . Tramadol Nausea And Vomiting    Lowers seizure threshold  . Doxycycline Nausea And Vomiting    Patient Measurements: Height: 4\' 11"  (149.9 cm) Weight: 36.2 kg (79 lb 14.4 oz) IBW/kg (Calculated) : 43.2 Heparin Dosing Weight: actual body weight   Vital Signs: Temp: 97.9 F (36.6 C) (12/15 0730) BP: 111/73 (12/15 0730) Pulse Rate: 104 (12/15 0730)  Labs: Recent Labs    12/29/19 1035 12/29/19 2040 12/30/19 0448 12/30/19 0756  HGB 10.1*  --  9.0*  --   HCT 29.3*  --  26.2*  --   PLT 63*  --  64*  --   APTT  --  39*  --  80*  LABPROT  --  16.6*  --   --   INR  --  1.4*  --   --   HEPARINUNFRC  --  0.84* 0.28*  --   CREATININE 0.38*  --  <0.30*  --     CrCl cannot be calculated (This lab value cannot be used to calculate CrCl because it is not a number: <0.30).  Assessment: 33 y/oF with PMH of stage IV metastatic breast cancer, DVT in 2020 who presented to [facility] ED with left-sided flank pain and nausea. Bilateral lower extremity venous duplex today + for acute superficial vein thrombosis involving the right and left great saphenous vein and SFJ. Pharmacy consulted for IV heparin dosing. Of note, pharmacy medication reconciliation technician confirmed with patient (with the help of patient's brother as an interpreter) that she is still taking Xarelto 20mg  PO daily (last dose 12/28/2019 at 1800). Baseline heparin level elevated, which likely confirms recent DOAC use. Admission CBC significant for new thrombocytopenia-discussed with Lang Snow, NP with [facility], who approved use of IV heparin, but requested no bolus and targeting lower therapeutic  range.   12/30/2019  PTT = 80 seconds on heparin 600 units/hr  Heparin level = 0.28  CBC: Hgb slightly decreased 9.0, Plts low but stable 64k  No bleeding or complications reported  Goal of Therapy:  Heparin level 0.3-0.5 units/ml aPTT 66-84 seconds Monitor platelets by anticoagulation protocol: Yes   Plan:   Continue heparin infusion at 600 units/hr  Will use aPTT for dose titration until aPTT and heparin level correlate, as baseline heparin level falsely elevated from recent DOAC use  APTT in 6 hours to verify therapeutic  Daily CBC (monitor Pltc closely), heparin level, aPTT  Monitor closely for s/sx of bleeding  F/u Oncology workup/recommendations regarding thrombocytopenia   Peggyann Juba, PharmD, BCPS Pharmacy: [phone] 12/30/2019,8:27 AM   Addendum: Consulted to change to Lovenox - will change to 1mg /kg (40mg ) q12h.  Peggyann Juba, PharmD, BCPS 12/30/2019 2:38 PM

## 2019-12-30 NOTE — Progress Notes (Signed)
Oral Chemo Policy - Tagrisso  Osimertinib (Tagrisso) Hold Criteria: . Interstitial lung disease/pneumonitis . QTc interval > 500 msec on at least 2 separate ECGs . QTc interval prolongation with symptoms of life-threatening arrhythmia . Asymptomatic absolute decrease in LVEF of 10% from baseline and below 50% . Symptomatic CHF . Grade 3 or higher ADE . Active infection   A/P: Patient is current on Rocephin for UTI, will hold Tagrisso per policy  Adrian Saran, PharmD, BCPS 12/30/2019 8:13 AM

## 2019-12-31 LAB — CBC
HCT: 28.8 % — ABNORMAL LOW (ref 36.0–46.0)
Hemoglobin: 9.4 g/dL — ABNORMAL LOW (ref 12.0–15.0)
MCH: 28.9 pg (ref 26.0–34.0)
MCHC: 32.6 g/dL (ref 30.0–36.0)
MCV: 88.6 fL (ref 80.0–100.0)
Platelets: 80 10*3/uL — ABNORMAL LOW (ref 150–400)
RBC: 3.25 MIL/uL — ABNORMAL LOW (ref 3.87–5.11)
RDW: 15.6 % — ABNORMAL HIGH (ref 11.5–15.5)
WBC: 7.6 10*3/uL (ref 4.0–10.5)
nRBC: 0 % (ref 0.0–0.2)

## 2019-12-31 LAB — BASIC METABOLIC PANEL
Anion gap: 14 (ref 5–15)
Anion gap: 10 (ref 5–15)
BUN: 8 mg/dL (ref 6–20)
BUN: 10 mg/dL (ref 6–20)
CO2: 21 mmol/L — ABNORMAL LOW (ref 22–32)
CO2: 26 mmol/L (ref 22–32)
Calcium: 6.3 mg/dL — CL (ref 8.9–10.3)
Calcium: 6.6 mg/dL — ABNORMAL LOW (ref 8.9–10.3)
Chloride: 92 mmol/L — ABNORMAL LOW (ref 98–111)
Chloride: 89 mmol/L — ABNORMAL LOW (ref 98–111)
Creatinine, Ser: 0.44 mg/dL (ref 0.44–1.00)
Creatinine, Ser: 0.75 mg/dL (ref 0.44–1.00)
GFR, Estimated: >60 mL/min (ref >60)
GFR, Estimated: >60 mL/min (ref >60)
Glucose, Bld: 70 mg/dL (ref 70–99)
Glucose, Bld: 104 mg/dL — ABNORMAL HIGH (ref 70–99)
Potassium: 3.3 mmol/L — ABNORMAL LOW (ref 3.5–5.1)
Potassium: 3.7 mmol/L (ref 3.5–5.1)
Sodium: 127 mmol/L — ABNORMAL LOW (ref 135–145)
Sodium: 125 mmol/L — ABNORMAL LOW (ref 135–145)

## 2019-12-31 LAB — URINE CULTURE: Culture: 30000 — AB

## 2019-12-31 MED ORDER — ADULT MULTIVITAMIN W/MINERALS CH
1.0000 | ORAL_TABLET | Freq: Every day | ORAL | Status: DC
  Administered 2019-12-31 – 2020-01-04 (×5): 1 via ORAL
  Filled 2019-12-31 (×4): qty 1

## 2019-12-31 MED ORDER — POTASSIUM CHLORIDE CRYS ER 20 MEQ PO TBCR
40.0000 meq | EXTENDED_RELEASE_TABLET | Freq: Two times a day (BID) | ORAL | Status: AC
  Administered 2019-12-31 (×2): 40 meq via ORAL
  Filled 2019-12-31 (×2): qty 2

## 2019-12-31 MED ORDER — CEPHALEXIN 500 MG PO CAPS
500.0000 mg | ORAL_CAPSULE | Freq: Two times a day (BID) | ORAL | Status: DC
  Administered 2019-12-31 – 2020-01-04 (×9): 500 mg via ORAL
  Filled 2019-12-31 (×9): qty 1

## 2019-12-31 MED ORDER — CALCIUM CARBONATE 1250 (500 CA) MG PO TABS
1000.0000 mg | ORAL_TABLET | Freq: Two times a day (BID) | ORAL | Status: DC
  Administered 2019-12-31 – 2020-01-01 (×4): 1000 mg via ORAL
  Filled 2019-12-31 (×4): qty 1

## 2019-12-31 MED ORDER — BOOST / RESOURCE BREEZE PO LIQD CUSTOM
1.0000 | Freq: Three times a day (TID) | ORAL | Status: DC
  Administered 2019-12-31 – 2020-01-04 (×7): 1 via ORAL

## 2019-12-31 NOTE — Progress Notes (Signed)
PROGRESS NOTE    Deborah Foster  ZOX:[id number] DOB: 1970/09/24 DOA: 12/29/2019 PCP: [name], MD   Brief Narrative: Taken from H&P. Deborah Foster is a 49 y.o. female with medical history significant of stage IV metastatic breast cancer follows with Dr. Julien Nordmann, prior treatments with radiotherapy to the brain as well as metastatic bone lesions in the pelvis, status post Tarceva, currently on Tagrisso, Xgeva.  Patient has also been complaining of lower suprapubic abdominal pain along with nausea.  She was also having some difficulty with urination.  CT abdomen with progressive intrahepatic and CBD dye lesion, and questioning CBD stone and findings concerning for pyelonephritis and acute pancreatitis.  Also found to have bilateral lower extremity superficial venous thrombosis, no DVT.  He was started on heparin infusion, after talking with oncology transition to Lovenox and she will need it for 3 to 6 weeks. Also found to have significant hyponatremia.  Subjective: Patient was feeling overall weak, stating that she has no appetite.  No other complaint.  Assessment & Plan:   Active Problems:   Hyponatremia   Elevated LFTs  Hyponatremia.  Labs consistent with hypovolemic hyponatremia, may be some element of paraneoplastic.  Sodium with some improvement to 122 next morning after getting 500 cc bolus of normal saline.  She was not on any other IV fluid. She was started on normal saline 100 mL/h on 12/15 afternoon.  Sodium appropriately being corrected, this morning it was 127. There is some decrease in bicarb most likely with normal saline. -Continue normal saline at 100 mL/h for another day. -Monitor sodium. -Goal correction 8-10 in 24-hour.  Hypokalemia and hypomagnesemia.  Magnesium within normal limit.  Potassium improved to 3.3 -Repeat potassium and monitor  Hypocalcemia.  Corrected calcium of 7.7, documented as 6.3 but patient also had albumin of 2.2. -Start her on calcium  supplement.  Sinus tachycardia.  Patient with volume depletion and advanced malignancy. Blood pressure on softer side.  Will not add any beta-blocker. -Replete volume and monitor.  UTI/pyelonephritis.  CT abdomen concerning for pyelonephritis.  Urine cultures with staph aureus and good sensitivity.  She was started on ceftriaxone. -Discontinue ceftriaxone and start her on Keflex to complete a 5-day course.  Superficial venous thrombosis.  Doppler studies with superficial venous thrombosis involving both lower extremities, no DVT.  She was started on heparin infusion.  Discussed with Dr. Julien Nordmann at oncology and he advised to transition to Lovenox, she will need Lovenox for 3 to 6 weeks.  Heparin was discontinued and she was started on Lovenox. -With Lovenox  Anemia of chronic disease.  Hemoglobin at 9.4 today. -Continue to monitor  Thrombocytopenia.  No active sign of bleeding.  Started improving. -Continue to monitor.  Seizure disorder.  No acute concern. -Continue home dose of Keppra.  CT abdomen with pancreatic inflammation.  Lipase within normal limit.  She had similar findings on CT scan before.  Thought to be secondary to radiation therapy.  No abdominal pain.  Progressive intrahepatic and CBD dilation.  RUQ ultrasound with CBD but no stone.  No pain, nausea or vomiting. -Can obtain GI consult if needed.  Severe malnutrition/hypoalbuminemia.  With advanced cancer and poor p.o. intake. -Dietary consult.  Generalized weakness.  Likely multifactorial with advanced malignancy, poor p.o. intake and hyponatremia. -We'll obtain PE evaluation for home health needs. -Also need palliative care consult-as she is very high risk for deterioration and death, I will defer that decision for her oncologist.  Objective: Vitals:   12/30/19  1858 12/30/19 2059 12/31/19 0242 12/31/19 0552  BP: 101/67 103/67 109/71 108/67  Pulse: (!) 113 (!) 110 (!) 116 (!) 116  Resp: 19 14 14 14   Temp: 97.7 F  (36.5 C) (!) 97.2 F (36.2 C) 97.7 F (36.5 C) 98.4 F (36.9 C)  TempSrc: Oral Oral Oral Oral  SpO2: 100% 100% 98% 100%  Weight:      Height:        Intake/Output Summary (Last 24 hours) at 12/31/2019 0746 Last data filed at 12/31/2019 0543 Gross per 24 hour  Intake 2015.73 ml  Output --  Net 2015.73 ml   Filed Weights   12/29/19 0942  Weight: 36.2 kg    Examination:  General.  Very emaciated and frail lady, in no acute distress. Pulmonary.  Lungs clear bilaterally, normal respiratory effort. CV.  Sinus tachycardia. Abdomen.  Soft, nontender, nondistended, BS positive. CNS.  Alert and oriented x3.  No focal neurologic deficit. Extremities.  No edema, no cyanosis, pulses intact and symmetrical. Psychiatry.  Judgment and insight appears normal.   DVT prophylaxis: Lovenox Code Status: Full Family Communication: Discussed with patient Disposition Plan:  Status is: Inpatient  Remains inpatient appropriate because:Inpatient level of care appropriate due to severity of illness   Dispo: The patient is from: Home              Anticipated d/c is to: Home              Anticipated d/c date is: 1 day              Patient currently is not medically stable to d/c.    Consultants:   Oncology  Procedures:  Antimicrobials:  Keflex  Data Reviewed: I have personally reviewed following labs and imaging studies  CBC: Recent Labs  Lab 12/29/19 1035 12/30/19 0448 12/31/19 0549  WBC 6.4 7.4 7.6  HGB 10.1* 9.0* 9.4*  HCT 29.3* 26.2* 28.8*  MCV 85.4 86.2 88.6  PLT 63* 64* 80*   Basic Metabolic Panel: Recent Labs  Lab 12/29/19 1035 12/30/19 0448 12/30/19 2041 12/31/19 0549  NA 120* 122* 124* 127*  K 2.3* 3.0* 3.7 3.3*  CL 71* 81* 87* 92*  CO2 36* 28 23 21*  GLUCOSE 116* 87 84 70  BUN 13 9 9 8   CREATININE 0.38* <0.30* 0.40* 0.44  CALCIUM 7.8* 6.9* 6.6* 6.3*  MG 2.2  --   --   --   PHOS 2.7  --   --   --    GFR: Estimated Creatinine Clearance: 49.7 mL/min  (by C-G formula based on SCr of 0.44 mg/dL). Liver Function Tests: Recent Labs  Lab 12/29/19 1035 12/30/19 0448  AST 22 25  ALT 13 14  ALKPHOS 82 74  BILITOT 0.6 1.1  PROT 5.3* 4.7*  ALBUMIN 2.4* 2.2*   Recent Labs  Lab 12/29/19 1035  LIPASE 17   Recent Labs  Lab 12/30/19 0032  AMMONIA 21   Coagulation Profile: Recent Labs  Lab 12/29/19 2040  INR 1.4*   Cardiac Enzymes: No results for input(s): CKTOTAL, CKMB, CKMBINDEX, TROPONINI in the last 168 hours. BNP (last 3 results) No results for input(s): PROBNP in the last 8760 hours. HbA1C: No results for input(s): HGBA1C in the last 72 hours. CBG: Recent Labs  Lab 12/29/19 1011  GLUCAP 116*   Lipid Profile: No results for input(s): CHOL, HDL, LDLCALC, TRIG, CHOLHDL, LDLDIRECT in the last 72 hours. Thyroid Function Tests: No results for input(s): TSH, T4TOTAL, FREET4, T3FREE,  THYROIDAB in the last 72 hours. Anemia Panel: No results for input(s): VITAMINB12, FOLATE, FERRITIN, TIBC, IRON, RETICCTPCT in the last 72 hours. Sepsis Labs: Recent Labs  Lab 12/29/19 1035  LATICACIDVEN 1.0    Recent Results (from the past 240 hour(s))  Resp Panel by RT-PCR (Flu A&B, Covid) Nasopharyngeal Swab     Status: None   Collection Time: 12/29/19  5:13 PM   Specimen: Nasopharyngeal Swab; Nasopharyngeal(NP) swabs in vial transport medium  Result Value Ref Range Status   SARS Coronavirus 2 by RT PCR NEGATIVE NEGATIVE Final    Comment: (NOTE) SARS-CoV-2 target nucleic acids are NOT DETECTED.  The SARS-CoV-2 RNA is generally detectable in upper respiratory specimens during the acute phase of infection. The lowest concentration of SARS-CoV-2 viral copies this assay can detect is 138 copies/mL. A negative result does not preclude SARS-Cov-2 infection and should not be used as the sole basis for treatment or other patient management decisions. A negative result may occur with  improper specimen collection/handling, submission of  specimen other than nasopharyngeal swab, presence of viral mutation(s) within the areas targeted by this assay, and inadequate number of viral copies(<138 copies/mL). A negative result must be combined with clinical observations, patient history, and epidemiological information. The expected result is Negative.  Fact Sheet for Patients:  EntrepreneurPulse.com.au  Fact Sheet for Healthcare Providers:  IncredibleEmployment.be  This test is no t yet approved or cleared by the [place] FDA and  has been authorized for detection and/or diagnosis of SARS-CoV-2 by FDA under an Emergency Use Authorization (EUA). This EUA will remain  in effect (meaning this test can be used) for the duration of the COVID-19 declaration under Section 564(b)(1) of the Act, 21 U.S.C.section 360bbb-3(b)(1), unless the authorization is terminated  or revoked sooner.       Influenza A by PCR NEGATIVE NEGATIVE Final   Influenza B by PCR NEGATIVE NEGATIVE Final    Comment: (NOTE) The Xpert Xpress SARS-CoV-2/FLU/RSV plus assay is intended as an aid in the diagnosis of influenza from Nasopharyngeal swab specimens and should not be used as a sole basis for treatment. Nasal washings and aspirates are unacceptable for Xpert Xpress SARS-CoV-2/FLU/RSV testing.  Fact Sheet for Patients: EntrepreneurPulse.com.au  Fact Sheet for Healthcare Providers: IncredibleEmployment.be  This test is not yet approved or cleared by the [place] FDA and has been authorized for detection and/or diagnosis of SARS-CoV-2 by FDA under an Emergency Use Authorization (EUA). This EUA will remain in effect (meaning this test can be used) for the duration of the COVID-19 declaration under Section 564(b)(1) of the Act, 21 U.S.C. section 360bbb-3(b)(1), unless the authorization is terminated or revoked.  Performed at [facility], [place]  [address] [place], [place] [address]   Culture, Urine     Status: Abnormal (Preliminary result)   Collection Time: 12/29/19  6:42 PM   Specimen: Urine, Clean Catch  Result Value Ref Range Status   Specimen Description   Final    URINE, CLEAN CATCH Performed at [facility], [place] [address] [place], [place] [address]    Special Requests   Final    NONE Performed at [facility], [place] [address], [place], [place] [address]    Culture (A)  Final    30,000 COLONIES/mL STAPHYLOCOCCUS AUREUS SUSCEPTIBILITIES TO FOLLOW Performed at [facility], [place] [address], [place],  [address]    Report Status PENDING  Incomplete     Radiology Studies: CT ABDOMEN PELVIS  W CONTRAST  Result Date: 12/29/2019 CLINICAL DATA:  Abdominal and back pain. History of metastatic lung cancer. EXAM: CT ABDOMEN AND PELVIS WITH CONTRAST TECHNIQUE: Multidetector CT imaging of the abdomen and pelvis was performed using the standard protocol following bolus administration of intravenous contrast. CONTRAST:  52mL OMNIPAQUE IOHEXOL 300 MG/ML  SOLN COMPARISON:  CT scan 11/21/2019 and 11/07/2019. FINDINGS: Lower chest: Small right pleural effusion is slightly larger when compared to the prior study. Stable radiation changes involving the right lower lobe. No findings suspicious for recurrent tumor. No metastatic pulmonary lesions are identified. The heart is normal in size. No pericardial effusion. Hepatobiliary: Heterogeneous appearance of the liver parenchyma may be due to an inflammatory process/hepatitis. There is also progressive intrahepatic biliary dilatation and significant common bile duct dilatation. It measures approximately 14.5 mm in the porta hepatis and a maximum of 9 mm in the head of the pancreas. Could not exclude a obstructing common bile duct stone. No obvious gallstones. Pericholecystic fluid is noted. Pancreas: The pancreas is enlarged/prominent and the head  region demonstrates diffuse low attenuation. This could represent decreased perfusion due to inflammation. Recommend correlation any clinical findings for pancreatitis. Low-attenuation lesions in the body/tail junction region are also noted may be inflammatory. Spleen: Spleen is small.  No focal lesions. Adrenals/Urinary Tract: Adrenal glands are unremarkable. There are changes consistent with pyelonephritis involving the right kidney in particular most notably in the lower pole region. Is also a ureteral thickening and enhancement bilaterally suggesting Pyo ureteral nephrosis. No obvious bladder wall thickening or bladder mass. Stomach/Bowel: Stomach demonstrates mild uniform wall thickening mucosal enhancement. Similar findings involving the duodenum. Findings could be due to gastritis and duodenitis. No findings for small bowel obstruction colonic mass. Vascular/Lymphatic: The aorta and branch vessels are patent. The major venous structures are patent. No mesenteric or retroperitoneal mass or adenopathy. Small scattered lymph nodes are stable. Reproductive: The uterus and ovaries are grossly normal. Other: Small to moderate amount of free abdominal/pelvic fluid and diffuse mesenteric edema. Musculoskeletal: Stable sclerotic osseous metastatic disease. No new or progressive findings. IMPRESSION: 1. Progressive intrahepatic and common bile duct dilatation. Could not exclude a obstructing common bile duct stone. Recommend correlation with with liver function studies. 2. CT findings consistent with pyelonephritis and pyoureteronephrosis. 3. Abnormal appearance of the pancreas as detailed above. Recommend correlation with amylase and lipase levels. Possible acute pancreatitis. 4. Small to moderate amount of free abdominal/pelvic fluid and diffuse mesenteric edema. 5. Stable sclerotic osseous metastatic disease. No new or progressive findings. 6. Stable radiation changes involving the right lower lobe. No findings  suspicious for recurrent tumor. 7. Heterogeneous appearance of the liver parenchyma may be due to an inflammatory process/hepatitis. 8. Aortic atherosclerosis. Aortic Atherosclerosis (ICD10-I70.0). Electronically Signed   By: Marijo Sanes M.D.   On: 12/29/2019 16:19   VAS [place] LOWER EXTREMITY VENOUS (DVT) (ONLY [facility] & [facility])  Result Date: 12/29/2019  Lower Venous DVT Study Indications: Swelling.  Risk Factors: Cancer. Comparison Study: No prior studies. Performing Technologist: Oliver Hum RVT  Examination Guidelines: A complete evaluation includes B-mode imaging, spectral Doppler, color Doppler, and power Doppler as needed of all accessible portions of each vessel. Bilateral testing is considered an integral part of a complete examination. Limited examinations for reoccurring indications may be performed as noted. The reflux portion of the exam is performed with the patient in reverse Trendelenburg.  +---------+---------------+---------+-----------+----------+--------------+  RIGHT     Compressibility Phasicity Spontaneity Properties Thrombus Aging  +---------+---------------+---------+-----------+----------+--------------+  CFV  Full            Yes       Yes                                    +---------+---------------+---------+-----------+----------+--------------+  SFJ       None            No        No                     Acute           +---------+---------------+---------+-----------+----------+--------------+  FV Prox   Full                                                             +---------+---------------+---------+-----------+----------+--------------+  FV Mid    Full                                                             +---------+---------------+---------+-----------+----------+--------------+  FV Distal Full                                                             +---------+---------------+---------+-----------+----------+--------------+  PFV       Full                                                              +---------+---------------+---------+-----------+----------+--------------+  POP       Full            Yes       Yes                                    +---------+---------------+---------+-----------+----------+--------------+  PTV       Full                                                             +---------+---------------+---------+-----------+----------+--------------+  PERO      Full                                                             +---------+---------------+---------+-----------+----------+--------------+  GSV       None  No        No                     Acute           +---------+---------------+---------+-----------+----------+--------------+   +---------+---------------+---------+-----------+----------+--------------+  LEFT      Compressibility Phasicity Spontaneity Properties Thrombus Aging  +---------+---------------+---------+-----------+----------+--------------+  CFV       Full            Yes       Yes                                    +---------+---------------+---------+-----------+----------+--------------+  SFJ       None            No        No                     Acute           +---------+---------------+---------+-----------+----------+--------------+  FV Prox   Full                                                             +---------+---------------+---------+-----------+----------+--------------+  FV Mid    Full                                                             +---------+---------------+---------+-----------+----------+--------------+  FV Distal Full                                                             +---------+---------------+---------+-----------+----------+--------------+  PFV       Full                                                             +---------+---------------+---------+-----------+----------+--------------+  POP       Full            Yes       Yes                                     +---------+---------------+---------+-----------+----------+--------------+  PTV       Full                                                             +---------+---------------+---------+-----------+----------+--------------+  PERO      Full                                                             +---------+---------------+---------+-----------+----------+--------------+  GSV       None            No        No                     Acute           +---------+---------------+---------+-----------+----------+--------------+     Summary: RIGHT: - No DVT Right lower extremity. - Findings consistent with acute superficial vein thrombosis involving the right great saphenous vein and SFJ. - No cystic structure found in the popliteal fossa.  LEFT: - No DVT of Left lower extremity. - Findings consistent with acute superficial vein thrombosis involving the left great saphenous vein and SFJ. - No cystic structure found in the popliteal fossa.  *See table(s) above for measurements and observations. Electronically signed by Deitra Mayo MD on 12/29/2019 at 6:13:44 PM.    Final    US Abdomen Limited RUQ (LIVER/GB)  Result Date: 12/29/2019 CLINICAL DATA:  49 year old female with elevated LFTs. History of lung cancer. EXAM: ULTRASOUND ABDOMEN LIMITED RIGHT UPPER QUADRANT COMPARISON:  CT abdomen pelvis dated 12/29/2019. FINDINGS: Gallbladder: No gallstone. There is diffuse thickened appearance of the gallbladder wall measuring 5 mm in thickness, likely related to ascites. Negative sonographic Murphy's sign. Common bile duct: Diameter: 18 mm. The common bile duct is dilated. No echogenic stone noted within the visualized CBD. Liver: There is coarsened appearance of the liver, likely early changes of cirrhosis. Clinical correlation is recommended. The liver demonstrates a normal echogenicity. Portal vein is patent on color Doppler imaging with normal direction of blood flow towards the liver. Other: Small ascites and  partially visualized small right pleural effusion. IMPRESSION: 1. Dilated CBD. No stone identified within the gallbladder or visualized CBD. 2. Coarsened liver.  Correlation with LFTs recommended. 3. Small ascites and partially visualized right pleural effusion. Electronically Signed   By: Anner Crete M.D.   On: 12/29/2019 19:42    Scheduled Meds:  calcium carbonate  1,000 mg of elemental calcium Oral BID WC   enoxaparin (LOVENOX) injection  40 mg Subcutaneous Q12H   ferrous sulfate  325 mg Oral Q breakfast   gabapentin  600 mg Oral BID   levETIRAcetam  250 mg Oral BID   morphine  30 mg Oral Q12H   polyethylene glycol  17 g Oral BID   senna-docusate  1 tablet Oral BID   Continuous Infusions:  sodium chloride 100 mL/hr at 12/30/19 2223   cefTRIAXone (ROCEPHIN)  IV 1 g (12/30/19 1904)     LOS: 1 day   Time spent: 30 minutes  Lorella Nimrod, MD [facility]  If 7PM-7AM, please contact night-coverage [email or web address]  12/31/2019, 7:46 AM   This record has been created using Systems analyst. Errors have been sought and corrected,but may not always be located. Such creation errors do not reflect on the standard of care.

## 2019-12-31 NOTE — Discharge Instructions (Signed)
[facility] Stay Proper nutrition can help your body recover from illness and injury.   Foods and beverages high in protein, vitamins, and minerals help rebuild muscle loss, promote healing, & reduce fall risk.   .In addition to eating healthy foods, a nutrition shake is an easy, delicious way to get the nutrition you need during and after your hospital stay  It is recommended that you continue to drink 3-4 bottles per day of:      Boost Breeze for at least 1 month (30 days) after your hospital stay   Tips for adding a nutrition shake into your routine: As allowed, drink one with vitamins or medications instead of water or juice Enjoy one as a tasty mid-morning or afternoon snack Drink cold or make a milkshake out of it Drink one instead of milk with cereal or snacks Use as a coffee creamer   Available at the following grocery stores and pharmacies:           * [employer]   ** [employer] [phone] -this is where to find [employer] **           For COUPONS visit: www.ensure.com/join or [email or web address]://dawson-may.[email or web address]/   Suggested Substitutions Ensure Plus = Boost Plus = Carnation Breakfast Essentials = Boost Compact Ensure Active Clear = Boost Breeze Glucerna Shake = Boost Glucose Control = Carnation Breakfast Essentials SUGAR FREE

## 2019-12-31 NOTE — Progress Notes (Signed)
Patient remains a yellow MEWS because of increased heart rate and intermittent low systolic's. Patient's attending is aware of heart rate and blood pressures. No new interventions/orders at this time.

## 2019-12-31 NOTE — Progress Notes (Signed)
Initial Nutrition Assessment  DOCUMENTATION CODES:   Underweight  INTERVENTION:   -Boost Breeze po TID, each supplement provides 250 kcal and 9 grams of protein -Multivitamin with minerals daily  NUTRITION DIAGNOSIS:   Increased nutrient needs related to cancer and cancer related treatments as evidenced by estimated needs.  GOAL:   Patient will meet greater than or equal to 90% of their needs  MONITOR:   PO intake,Supplement acceptance,Labs,Weight trends,I & O's  REASON FOR ASSESSMENT:   Consult Assessment of nutrition requirement/status  ASSESSMENT:   49 y.o. female with medical history significant of stage IV metastatic breast cancer follows with Dr. Julien Nordmann, prior treatments with radiotherapy to the brain as well as metastatic bone lesions in the pelvis, status post Tarceva, currently on Tagrisso, Xgeva.  Patient has also been complaining of lower suprapubic abdominal pain along with nausea.  She was also having some difficulty with urination. Admitted for hyponatremia.  Patient in room with lunch tray. Noted that chart states pt needs interpreter. Pt was able to converse in [place] with RD.  Pt about to eat, needed assistance salting her food. Pt reports poor appetite ever since she was taken off her thyroid medicine ~ 1 week ago. States she felt better on it and was eating again while taking it.   Pt states she didn't eat any breakfast this morning. Would intermittently gasp in pain during visit. Notified RN.  Pt really likes Boost Breeze supplements but states she has a hard time finding it in stores. Will place information on where to find the supplement for pt's reference at discharge. Will order Boost Breeze and daily MVI.  Per weight records, pt has continued to lose weight. She has lost another 4 lbs since 10/23 (4% wt loss x 1.5 months, insignificant for time frame) but given underweight status, it is not desirable.  Suspect some degree of malnutrition but will  need NFPE for diagnosis.  Medications: OSCAL, Ferrous sulfate, Miralax, KLOR-CON, Senokot  Labs reviewed: Low Na  NUTRITION - FOCUSED PHYSICAL EXAM:  Deferred as pt was in pain during visit  Diet Order:   Diet Order            Diet regular Room service appropriate? Yes; Fluid consistency: Thin  Diet effective now                 EDUCATION NEEDS:   No education needs have been identified at this time  Skin:  Skin Assessment: Reviewed RN Assessment  Last BM:  12/15  Height:   Ht Readings from Last 1 Encounters:  12/29/19 4\' 11"  (1.499 m)    Weight:   Wt Readings from Last 1 Encounters:  12/29/19 36.2 kg   BMI:  Body mass index is 16.14 kg/m.  Estimated Nutritional Needs:   Kcal:  1450-1650  Protein:  70-85g  Fluid:  1.7L/day  Clayton Bibles, MS, RD, LDN Inpatient Clinical Dietitian Contact information available via Amion

## 2019-12-31 NOTE — TOC Initial Note (Signed)
Transition of Care (TOC) - Initial/Assessment Note    Patient Details  Name: Deborah Foster MRN: [id number] Date of Birth: 05/08/1970  Transition of Care [employer]) CM/SW Contact:    Lynnell Catalan, RN Phone Number: 12/31/2019, 9:36 AM  Clinical Narrative:                  Expected Discharge Plan: Home/Self Care Barriers to Discharge: Continued Medical Work up Expected Discharge Plan and Services Expected Discharge Plan: Home/Self Care    Prior Living Arrangements/Services   Lives with:: Spouse Patient language and need for interpreter reviewed:: Yes        Need for Family Participation in Patient Care: Yes (Comment) Care giver support system in place?: Yes (comment)   Criminal Activity/Legal Involvement Pertinent to Current Situation/Hospitalization: No - Comment as needed  Activities of Daily Living Home Assistive Devices/Equipment: Gilford Rile (specify type) ADL Screening (condition at time of admission) Patient's cognitive ability adequate to safely complete daily activities?: Yes Is the patient deaf or have difficulty hearing?: No Does the patient have difficulty seeing, even when wearing glasses/contacts?: Yes (Blurry vision at times) Does the patient have difficulty concentrating, remembering, or making decisions?: No Patient able to express need for assistance with ADLs?: Yes Does the patient have difficulty dressing or bathing?: Yes Independently performs ADLs?: No Communication: Independent Dressing (OT): Needs assistance Is this a change from baseline?: Pre-admission baseline Grooming: Independent Feeding: Independent Bathing: Needs assistance Is this a change from baseline?: Pre-admission baseline Toileting: Needs assistance Is this a change from baseline?: Pre-admission baseline In/Out Bed: Needs assistance Is this a change from baseline?: Pre-admission baseline Walks in Home: Dependent Is this a change from baseline?: Pre-admission baseline Does the patient have  difficulty walking or climbing stairs?: Yes Weakness of Legs: Both Weakness of Arms/Hands: Both Admission diagnosis:  Hypokalemia [E87.6] Hyponatremia [E87.1] Elevated LFTs [R79.89] Patient Active Problem List   Diagnosis Date Noted  . Elevated LFTs   . Dehydration   . Sinus tachycardia   . Constipation   . Hypokalemia   . Hypophosphatemia   . Hypocalcemia   . Metabolic acidosis   . Seizure disorder ([place])   . Hyponatremia 11/21/2019  . Nausea and vomiting 11/21/2019  . Back pain 11/21/2019  . Lung metastasis ([place]) 04/30/2019  . Goals of care, counseling/discussion 05/05/2018  . Epigastric pain 05/05/2018  . Blurry vision, bilateral 05/05/2018  . 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 Metastasis to supraclavicular lymph node ([place]) 04/08/2018  . Difficulty sleeping 03/20/2018  . Primary cancer of right lower lobe of lung ([place]) 07/22/2015  . Malnutrition of moderate degree 07/21/2015  . Hemoptysis   . Primary lung adenocarcinoma ([place])   . Odynophagia 07/05/2015  . Folic acid deficiency 83/66/2947  . B12 deficiency 06/06/2015  . Radiation pneumonitis with hemoptysis 06/06/2015  . Bone metastases ([place]) 01/20/2015  . Malignant neoplasm metastatic to left adrenal gland ([place]) 01/20/2015  . Bronchitis 11/22/2014  . Encounter for antineoplastic chemotherapy 11/02/2014  . Mucositis 10/22/2014  . Rash 10/08/2014  . Non-small cell carcinoma of lung, stage 4 ([place]) 08/26/2014  . Bone disease, metabolic 65/46/5035  . Metastasis to brain ([place])   . Palliative care encounter   . Adrenal mass, left ([place])   . CAP (community acquired pneumonia) 08/07/2014  . Pneumonia 08/07/2014  . Lung mass 08/07/2014   PCP:  [name], MD Pharmacy:   [facility] [address]  [place] [address] Phone: [phone] Fax: [facility], [address] [place] [address] Phone: [phone] Fax: [phone]     Social Determinants of Health (SDOH) Interventions    Readmission Risk Interventions Readmission Risk Prevention Plan 12/31/2019 11/24/2019  Transportation Screening Complete -  PCP or Specialist Appt within 3-5 Days - Complete  HRI or [place] - Complete  Palliative Care Screening - Complete  Medication Review ([place]) Complete Complete  PCP or Specialist appointment within 3-5 days of discharge Not Complete -  PCP/Specialist Appt Not Complete comments Not ready for dc yet -  [place] or [place] Not Complete -  [place] or Home Care Consult Pt Refusal Comments Not needed -  SW Recovery Care/Counseling Consult Complete -  Palliative Care Screening Not Applicable -  [place] Not Applicable -  Some recent data might be hidden

## 2020-01-01 ENCOUNTER — Ambulatory Visit (HOSPITAL_COMMUNITY): Admission: RE | Admit: 2020-01-01 | Payer: Medicare Other | Source: Ambulatory Visit

## 2020-01-01 LAB — HEPATIC FUNCTION PANEL
ALT: 11 U/L (ref 0–44)
AST: 18 U/L (ref 15–41)
Albumin: 2 g/dL — ABNORMAL LOW (ref 3.5–5.0)
Alkaline Phosphatase: 67 U/L (ref 38–126)
Bilirubin, Direct: 0.1 mg/dL (ref 0.0–0.2)
Indirect Bilirubin: 0.7 mg/dL (ref 0.3–0.9)
Total Bilirubin: 0.8 mg/dL (ref 0.3–1.2)
Total Protein: 4.4 g/dL — ABNORMAL LOW (ref 6.5–8.1)

## 2020-01-01 LAB — BASIC METABOLIC PANEL
Anion gap: 10 (ref 5–15)
Anion gap: 6 (ref 5–15)
BUN: 7 mg/dL (ref 6–20)
BUN: 8 mg/dL (ref 6–20)
CO2: 22 mmol/L (ref 22–32)
CO2: 25 mmol/L (ref 22–32)
Calcium: 6.3 mg/dL — CL (ref 8.9–10.3)
Calcium: 7 mg/dL — ABNORMAL LOW (ref 8.9–10.3)
Chloride: 94 mmol/L — ABNORMAL LOW (ref 98–111)
Chloride: 95 mmol/L — ABNORMAL LOW (ref 98–111)
Creatinine, Ser: 0.39 mg/dL — ABNORMAL LOW (ref 0.44–1.00)
Creatinine, Ser: 0.41 mg/dL — ABNORMAL LOW (ref 0.44–1.00)
GFR, Estimated: >60 mL/min (ref >60)
GFR, Estimated: >60 mL/min (ref >60)
Glucose, Bld: 105 mg/dL — ABNORMAL HIGH (ref 70–99)
Glucose, Bld: 112 mg/dL — ABNORMAL HIGH (ref 70–99)
Potassium: 3.8 mmol/L (ref 3.5–5.1)
Potassium: 3.8 mmol/L (ref 3.5–5.1)
Sodium: 126 mmol/L — ABNORMAL LOW (ref 135–145)
Sodium: 126 mmol/L — ABNORMAL LOW (ref 135–145)

## 2020-01-01 LAB — CBC
HCT: 26 % — ABNORMAL LOW (ref 36.0–46.0)
Hemoglobin: 8.6 g/dL — ABNORMAL LOW (ref 12.0–15.0)
MCH: 29.3 pg (ref 26.0–34.0)
MCHC: 33.1 g/dL (ref 30.0–36.0)
MCV: 88.4 fL (ref 80.0–100.0)
Platelets: 87 10*3/uL — ABNORMAL LOW (ref 150–400)
RBC: 2.94 MIL/uL — ABNORMAL LOW (ref 3.87–5.11)
RDW: 15.8 % — ABNORMAL HIGH (ref 11.5–15.5)
WBC: 7.8 10*3/uL (ref 4.0–10.5)
nRBC: 0 % (ref 0.0–0.2)

## 2020-01-01 MED ORDER — TOLVAPTAN 15 MG PO TABS
15.0000 mg | ORAL_TABLET | Freq: Once | ORAL | Status: AC
  Administered 2020-01-01: 18:00:00 15 mg via ORAL
  Filled 2020-01-01: qty 1

## 2020-01-01 MED ORDER — CALCIUM GLUCONATE-NACL 1-0.675 GM/50ML-% IV SOLN
1.0000 g | Freq: Once | INTRAVENOUS | Status: AC
  Administered 2020-01-01: 10:00:00 1000 mg via INTRAVENOUS
  Filled 2020-01-01: qty 50

## 2020-01-01 NOTE — Progress Notes (Signed)
   12/31/19 2034  Assess: MEWS Score  Temp 97.6 F (36.4 C)  BP (!) 97/53  Pulse Rate (!) 117  Resp 14  SpO2 97 %  Assess: MEWS Score  MEWS Temp 0  MEWS Systolic 1  MEWS Pulse 2  MEWS RR 0  MEWS LOC 0  MEWS Score 3  MEWS Score Color Yellow  Assess: if the MEWS score is Yellow or Red  Were vital signs taken at a resting state? Yes  Focused Assessment No change from prior assessment  Early Detection of Sepsis Score *See Row Information* Low  MEWS guidelines implemented *See Row Information* No, previously yellow, continue vital signs every 4 hours  Treat  MEWS Interventions Administered scheduled meds/treatments;Administered prn meds/treatments  Pain Scale 0-10  Pain Score 1  Pain Type Acute pain  Pain Location Back  Pain Orientation Lower  Take Vital Signs  Increase Vital Sign Frequency  Yellow: Q 2hr X 2 then Q 4hr X 2, if remains yellow, continue Q 4hrs  Escalate  MEWS: Escalate Yellow: discuss with charge nurse/RN and consider discussing with provider and RRT  Notify: Charge Nurse/RN  Name of Charge Nurse/RN Notified [place], RN  Date Charge Nurse/RN Notified 12/31/19  Time Charge Nurse/RN Notified 2034  Notify: Provider  Provider Name/Title  (MD aware of it, no new orders)

## 2020-01-01 NOTE — Consult Note (Addendum)
Renal Service Consult Note [facility]  Deborah Foster 01/01/2020 Deborah Blazing, MD Requesting Physician: Dr Deborah Foster, Deborah Foster.   Reason for Consult: Hyponatremia HPI: The patient is a 49 y.o. year-old w/ hx of stage IV metastatic lung Ca admitted w/ nausea and abd pain on 12/29/19.  CT exam showed possible pancreatitis and pyelonephritis.  Also found to have bilat [place] superficial venous thrombosis, but not DVT.  Started on IV heparin, then changed to lovenox for 3-6 wks. Pt was very weak. Urine cx grew Staph aureus.  Na was low at 122 on admission and improved w/ 0.9% saline up to 127 yesterday, but today is down to 126.  Asked to see for hyponatremia.       ROS  denies CP  no joint pain   no HA  no blurry vision  no rash  no diarrhea  no dysuria  no difficulty voiding  no change in urine color    Past Medical History  Past Medical History:  Diagnosis Date  . Bone cancer ([place])   . Bone metastasis ([place])   . Hemoptysis   . Hypokalemia   . lung ca dx'd 07/2014  . Lung mass   . Metastasis to adrenal gland ([place])   . Metastasis to brain ([place])   . Pneumonia   . Radiation 08/23/14-09/07/14   Brain/chest and left hip 30 Gy 12 Fx  . URI (upper respiratory infection) 02-02-2015   Past Surgical History  Past Surgical History:  Procedure Laterality Date  . IR GENERIC HISTORICAL  07/14/2015   IR RADIOLOGIST EVAL & MGMT 07/14/2015 Greggory Keen, MD [facility] INTERV RAD  . VIDEO BRONCHOSCOPY Bilateral 08/09/2014   Procedure: VIDEO BRONCHOSCOPY WITHOUT FLUORO;  Surgeon: [name], MD;  Location[facility] ENDOSCOPY;  Service: Cardiopulmonary;  Laterality: Bilateral;  . VIDEO BRONCHOSCOPY Bilateral 07/06/2015   Procedure: VIDEO BRONCHOSCOPY WITHOUT FLUORO;  Surgeon: [name], MD;  Location: [facility] ENDOSCOPY;  Service: Cardiopulmonary;  Laterality: Bilateral;   Family History  Family History  Problem Relation Age of Onset  . Hyperlipidemia Mother    Social History  reports that  she has never smoked. She has never used smokeless tobacco. She reports that she does not drink alcohol and does not use drugs. Allergies  Allergies  Allergen Reactions  . Gabapentin Other (See Comments)    Abdominal pain  . Other Other (See Comments)    Patient states that there is some she takes that makes her throat "cry" AND SCRATCHY   . Tramadol Nausea And Vomiting    Lowers seizure threshold  . Doxycycline Nausea And Vomiting   Home medications Prior to Admission medications   Medication Sig Start Date End Date Taking? Authorizing Provider  calcium-vitamin D (OSCAL WITH D) 500-200 MG-UNIT tablet Take 1 tablet by mouth 3 (three) times daily. 11/27/19  Yes Eugenie Filler, MD  Cyanocobalamin (VITAMIN B-12) 2500 MCG SUBL Place 2,500 mcg under the tongue daily.   Yes [provider]  diclofenac Sodium (VOLTAREN) 1 % GEL Apply 2 g topically 4 (four) times daily. 11/27/19  Yes Eugenie Filler, MD  ferrous sulfate 325 (65 FE) MG tablet Take 325 mg by mouth daily with breakfast.   Yes [provider]  folic acid (FOLVITE) 431 MCG tablet Take 800 mcg by mouth daily.   Yes [provider]  furosemide (LASIX) 80 MG tablet Take 40-80 mg by mouth daily as needed for fluid. 12/14/19  Yes [provider]  gabapentin (NEURONTIN)  300 MG capsule Take 600 mg by mouth 2 (two) times daily. 12/16/19  Yes [provider]  HYDROmorphone (DILAUDID) 4 MG tablet Take 1 tablet (4 mg total) by mouth every 6 (six) hours as needed for severe pain. 11/27/19  Yes Acquanetta Chain, DO  lactulose (CHRONULAC) 10 GM/15ML solution Take 30 mLs (20 g total) by mouth 2 (two) times daily as needed for moderate constipation. 11/30/19  Yes Tanner, Lyndon Code., PA-C  levETIRAcetam (KEPPRA) 250 MG tablet Take 1 tablet (250 mg total) by mouth 2 (two) times daily. 07/02/19  Yes Vaslow, Acey Lav, MD  loratadine (CLARITIN) 10 MG tablet Take 1 tablet (10 mg total) by mouth  daily. Patient taking differently: Take 10 mg by mouth daily as needed for allergies. 04/30/16  Yes [name], MD  morphine (MS CONTIN) 30 MG 12 hr tablet Take 1 tablet (30 mg total) by mouth every 12 (twelve) hours. 12/08/19  Yes [name], MD  ondansetron (ZOFRAN) 8 MG tablet Take 8 mg by mouth every 8 (eight) hours as needed for nausea or vomiting.  06/30/19  Yes [provider]  polyethylene glycol (MIRALAX / GLYCOLAX) 17 g packet Take 17 g by mouth 2 (two) times daily. 11/27/19  Yes Eugenie Filler, MD  senna-docusate (SENOKOT-S) 8.6-50 MG tablet Take 1 tablet by mouth 2 (two) times daily. 11/27/19  Yes Eugenie Filler, MD  TAGRISSO 80 MG tablet TAKE 1 TABLET (80 MG TOTAL) BY MOUTH DAILY. Patient taking differently: Take 80 mg by mouth daily. 04/28/19  Yes [name], MD  XARELTO 20 MG TABS tablet Take 20 mg by mouth every evening. 12/03/19  Yes [provider]  dexamethasone (DECADRON) 4 MG tablet Take 1 tablet (4 mg total) by mouth every 12 (twelve) hours. Patient not taking: No sig reported 11/27/19   Acquanetta Chain, DO     Vitals:   12/31/19 2034 01/01/20 0007 01/01/20 0449 01/01/20 1556  BP: (!) 97/53 97/61 111/67 (!) 110/57  Pulse: (!) 117 (!) 119 (!) 118 (!) 122  Resp: 14 14 14 18   Temp: 97.6 F (36.4 C)  98 F (36.7 C) 97.6 F (36.4 C)  TempSrc: Oral  Oral Oral  SpO2: 97% 96% 95% 96%  Weight:      Height:       Exam Gen frail , pleasant Asian adult female No rash, cyanosis or gangrene Sclera anicteric, throat clear  No jvd or bruits Chest clear bilat to bases no rales or wheezing RRR no MRG Abd soft ntnd no mass or ascites +bs GU deferred MS no joint effusions or deformity Ext 1-2+ pitting Romero and hip edema, no wounds or ulcers Neuro is alert, Ox 3 , nf    Home meds:  - lasix 40/80 qd prn  - MS contin 30 bid/ keppra 250 bid/ dialudid 4mg  qid prn/ neurontin 600 bid / voltaren gel prn  - lactulose bid prn/ zofran  prn/ senokot prn/ miralax prn  - tagrisso/ decadron (chemoRx) periodically    12/30/19 - UNa 20, Uosm 627    UA 12/14 - negative    BP 111/67  HR 118  RR 14   98.4      Na 126  K 3.8  CO2 22  BUN 7  Cr 0.39  AF 10  Alb 2.2  Tprot 4.7     WBC 7K  Hb 8.6  plt 87 K  Assessment/ Plan: 1. Hyponatremia - hypervolemic w/ bilat edema up to the  hips.  LFT's are okay. Recommend 1 dose of tolvaptan, then fluid restriction.  Sig edema may be due to low alb/ 3rd spacing related to cancer/ inflammation. No signs of left-sided CHF (lungs clear, no jvd). Lasix would be another option if tolvaptan doesn't help. Check am cortisol. Recent TSH okay. Underlying SIADH is another possibility.  2. Stage IV lung cancer 3. UTI/ pyelonephritis - on ancef IV 4. Sinus tach 5. Superficial venous thrombosis - on lovenox 6. Anemia of chronic disease - Hb 8-9 range      Kelly Splinter  MD 01/01/2020, 4:21 PM  Recent Labs  Lab 12/31/19 0549 01/01/20 0610  WBC 7.6 7.8  HGB 9.4* 8.6*   Recent Labs  Lab 12/29/19 1035 12/30/19 0448 12/31/19 1526 01/01/20 0610  K 2.3*   < > 3.7 3.8  BUN 13   < > 10 7  CREATININE 0.38*   < > 0.75 0.39*  CALCIUM 7.8*   < > 6.6* 6.3*  PHOS 2.7  --   --   --    < > = values in this interval not displayed.

## 2020-01-01 NOTE — Progress Notes (Signed)
Noticed her BLE edema increased, Lovey Newcomer, NP was notified, Per Providers order decreased 9% ND rate( See MAR).

## 2020-01-01 NOTE — Progress Notes (Signed)
PROGRESS NOTE    Deborah Foster  EXB:[id number] DOB: 06/19/1970 DOA: 12/29/2019 PCP: [name], MD   Brief Narrative: Taken from H&P. Deborah Foster is a 49 y.o. female with medical history significant of stage IV metastatic breast cancer follows with Dr. Julien Nordmann, prior treatments with radiotherapy to the brain as well as metastatic bone lesions in the pelvis, status post Tarceva, currently on Tagrisso, Xgeva.  Patient has also been complaining of lower suprapubic abdominal pain along with nausea.  She was also having some difficulty with urination.  CT abdomen with progressive intrahepatic and CBD dye lesion, and questioning CBD stone and findings concerning for pyelonephritis and acute pancreatitis.  Also found to have bilateral lower extremity superficial venous thrombosis, no DVT.  He was started on heparin infusion, after talking with oncology transition to Lovenox and she will need it for 3 to 6 weeks. Also found to have significant hyponatremia.  Subjective: Patient continued to feel weak, stating that she cannot walk.  PT was ordered but have not seen her yet.  Assessment & Plan:   Active Problems:   Hyponatremia   Elevated LFTs  Hyponatremia.  Initial labs are consistent with hypovolemic hyponatremia and she received fluid in the beginning with some appropriate response until yesterday morning, sodium started trending down despite being given normal saline and she also developed lower extremity edema. 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 Patient has persistent hyponatremia since October 2021. Bicarb improved today. -Discontinue IV fluid today. -Nephrology consult. -Monitor sodium. -Goal correction 8-10 in 24-hour.  Hypokalemia and hypomagnesemia.  Magnesium within normal limit.  Potassium improved to 3.3 -Repeat potassium and monitor  Hypocalcemia.  Corrected calcium  of 7.7, documented as 6.3 but patient also had albumin of 2.2. -Give her 1 g of calcium gluconate today. -Continue calcium supplement.  Sinus tachycardia.  Patient with volume depletion and advanced malignancy. Blood pressure on softer side.  Will not add any beta-blocker. -Replete volume and monitor.  UTI/pyelonephritis.  CT abdomen concerning for pyelonephritis.  Urine cultures with staph aureus and good sensitivity.  She was started on ceftriaxone. -Discontinue ceftriaxone and start her on Keflex to complete a 5-day course.  Superficial venous thrombosis.  Doppler studies with superficial venous thrombosis involving both lower extremities, no DVT.  She was started on heparin infusion.  Discussed with Dr. Julien Nordmann at oncology and he advised to transition to Lovenox, she will need Lovenox for 3 to 6 weeks.  Heparin was discontinued and she was started on Lovenox. -Continue with Lovenox  Anemia of chronic disease.  Hemoglobin at 9.4 today. -Continue to monitor  Thrombocytopenia.  No active sign of bleeding.  Started improving. -Continue to monitor.  Seizure disorder.  No acute concern. -Continue home dose of Keppra.  CT abdomen with pancreatic inflammation.  Lipase within normal limit.  She had similar findings on CT scan before.  Thought to be secondary to radiation therapy.  No abdominal pain.  Progressive intrahepatic and CBD dilation.  RUQ ultrasound with CBD but no stone.  No pain, nausea or vomiting. -Can obtain GI consult if needed.  Severe malnutrition/hypoalbuminemia.  With advanced cancer and poor p.o. intake. -Dietary consult.  Generalized weakness.  Likely multifactorial with advanced malignancy, poor p.o. intake and hyponatremia. -PE evaluation ordered-still pending -Also need palliative care consult-as she is very high risk for deterioration and death, I will defer that decision for her oncologist.  Objective: Vitals:   12/31/19 1704 12/31/19 2034 01/01/20 0007  01/01/20 0449  BP: 113/73 (!) 97/53 97/61 111/67  Pulse: (!) 120 (!) 117 (!) 119 (!) 118  Resp: 16 14 14 14   Temp: 97.9 F (36.6 C) 97.6 F (36.4 C)  98 F (36.7 C)  TempSrc: Oral Oral  Oral  SpO2: 100% 97% 96% 95%  Weight:      Height:        Intake/Output Summary (Last 24 hours) at 01/01/2020 1237 Last data filed at 01/01/2020 0945 Gross per 24 hour  Intake 120 ml  Output --  Net 120 ml   Filed Weights   12/29/19 0942  Weight: 36.2 kg    Examination:  General.  Frail, emaciated lady in no acute distress. Pulmonary.  Lungs clear bilaterally, normal respiratory effort. CV.  Regular rate and rhythm, no JVD, rub or murmur. Abdomen.  Soft, nontender, nondistended, BS positive. CNS.  Alert and oriented x3.  No focal neurologic deficit. Extremities.  1+ Wickard erythema back edema, no cyanosis, pulses intact and symmetrical. Psychiatry.  Judgment and insight appears normal.  DVT prophylaxis: Lovenox Code Status: Full Family Communication: Discussed with patient Disposition Plan:  Status is: Inpatient  Remains inpatient appropriate because:Inpatient level of care appropriate due to severity of illness   Dispo: The patient is from: Home              Anticipated d/c is to: Home              Anticipated d/c date is: 1 day              Patient currently is not medically stable to d/c.    Consultants:   Oncology  Nephrology  Procedures:  Antimicrobials:  Keflex  Data Reviewed: I have personally reviewed following labs and imaging studies  CBC: Recent Labs  Lab 12/29/19 1035 12/30/19 0448 12/31/19 0549 01/01/20 0610  WBC 6.4 7.4 7.6 7.8  HGB 10.1* 9.0* 9.4* 8.6*  HCT 29.3* 26.2* 28.8* 26.0*  MCV 85.4 86.2 88.6 88.4  PLT 63* 64* 80* 87*   Basic Metabolic Panel: Recent Labs  Lab 12/29/19 1035 12/30/19 0448 12/30/19 2041 12/31/19 0549 12/31/19 1526 01/01/20 0610  NA 120* 122* 124* 127* 125* 126*  K 2.3* 3.0* 3.7 3.3* 3.7 3.8  CL 71* 81* 87* 92* 89*  94*  CO2 36* 28 23 21* 26 22  GLUCOSE 116* 87 84 70 104* 105*  BUN 13 9 9 8 10 7   CREATININE 0.38* <0.30* 0.40* 0.44 0.75 0.39*  CALCIUM 7.8* 6.9* 6.6* 6.3* 6.6* 6.3*  MG 2.2  --   --   --   --   --   PHOS 2.7  --   --   --   --   --    GFR: Estimated Creatinine Clearance: 49.7 mL/min (A) (by C-G formula based on SCr of 0.39 mg/dL (L)). Liver Function Tests: Recent Labs  Lab 12/29/19 1035 12/30/19 0448  AST 22 25  ALT 13 14  ALKPHOS 82 74  BILITOT 0.6 1.1  PROT 5.3* 4.7*  ALBUMIN 2.4* 2.2*   Recent Labs  Lab 12/29/19 1035  LIPASE 17   Recent Labs  Lab 12/30/19 0032  AMMONIA 21   Coagulation Profile: Recent Labs  Lab 12/29/19 2040  INR 1.4*   Cardiac Enzymes: No results for input(s): CKTOTAL, CKMB, CKMBINDEX, TROPONINI in the last 168 hours. BNP (last 3 results) No results for input(s): PROBNP in the last 8760  hours. HbA1C: No results for input(s): HGBA1C in the last 72 hours. CBG: Recent Labs  Lab 12/29/19 1011  GLUCAP 116*   Lipid Profile: No results for input(s): CHOL, HDL, LDLCALC, TRIG, CHOLHDL, LDLDIRECT in the last 72 hours. Thyroid Function Tests: No results for input(s): TSH, T4TOTAL, FREET4, T3FREE, THYROIDAB in the last 72 hours. Anemia Panel: No results for input(s): VITAMINB12, FOLATE, FERRITIN, TIBC, IRON, RETICCTPCT in the last 72 hours. Sepsis Labs: Recent Labs  Lab 12/29/19 1035  LATICACIDVEN 1.0    Recent Results (from the past 240 hour(s))  Resp Panel by RT-PCR (Flu A&B, Covid) Nasopharyngeal Swab     Status: None   Collection Time: 12/29/19  5:13 PM   Specimen: Nasopharyngeal Swab; Nasopharyngeal(NP) swabs in vial transport medium  Result Value Ref Range Status   SARS Coronavirus 2 by RT PCR NEGATIVE NEGATIVE Final    Comment: (NOTE) SARS-CoV-2 target nucleic acids are NOT DETECTED.  The SARS-CoV-2 RNA is generally detectable in upper respiratory specimens during the acute phase of infection. The lowest concentration of  SARS-CoV-2 viral copies this assay can detect is 138 copies/mL. A negative result does not preclude SARS-Cov-2 infection and should not be used as the sole basis for treatment or other patient management decisions. A negative result may occur with  improper specimen collection/handling, submission of specimen other than nasopharyngeal swab, presence of viral mutation(s) within the areas targeted by this assay, and inadequate number of viral copies(<138 copies/mL). A negative result must be combined with clinical observations, patient history, and epidemiological information. The expected result is Negative.  Fact Sheet for Patients:  EntrepreneurPulse.com.au  Fact Sheet for Healthcare Providers:  IncredibleEmployment.be  This test is no t yet approved or cleared by the [place] FDA and  has been authorized for detection and/or diagnosis of SARS-CoV-2 by FDA under an Emergency Use Authorization (EUA). This EUA will remain  in effect (meaning this test can be used) for the duration of the COVID-19 declaration under Section 564(b)(1) of the Act, 21 U.S.C.section 360bbb-3(b)(1), unless the authorization is terminated  or revoked sooner.       Influenza A by PCR NEGATIVE NEGATIVE Final   Influenza B by PCR NEGATIVE NEGATIVE Final    Comment: (NOTE) The Xpert Xpress SARS-CoV-2/FLU/RSV plus assay is intended as an aid in the diagnosis of influenza from Nasopharyngeal swab specimens and should not be used as a sole basis for treatment. Nasal washings and aspirates are unacceptable for Xpert Xpress SARS-CoV-2/FLU/RSV testing.  Fact Sheet for Patients: EntrepreneurPulse.com.au  Fact Sheet for Healthcare Providers: IncredibleEmployment.be  This test is not yet approved or cleared by the [place] FDA and has been authorized for detection and/or diagnosis of SARS-CoV-2 by FDA under an Emergency Use  Authorization (EUA). This EUA will remain in effect (meaning this test can be used) for the duration of the COVID-19 declaration under Section 564(b)(1) of the Act, 21 U.S.C. section 360bbb-3(b)(1), unless the authorization is terminated or revoked.  Performed at [facility], [place] [address] [place], [place] [address]   Culture, Urine     Status: Abnormal   Collection Time: 12/29/19  6:42 PM   Specimen: Urine, Clean Catch  Result Value Ref Range Status   Specimen Description   Final    URINE, CLEAN CATCH Performed at [facility], [place] [address], [place], [place] [address]    Special Requests   Final    NONE Performed at [facility], [address]  [place],  [address]    Culture 30,000 COLONIES/mL STAPHYLOCOCCUS AUREUS (A)  Final   Report Status 12/31/2019 FINAL  Final   Organism ID, Bacteria STAPHYLOCOCCUS AUREUS (A)  Final      Susceptibility   Staphylococcus aureus - MIC*    CIPROFLOXACIN <=0.5 SENSITIVE Sensitive     GENTAMICIN <=0.5 SENSITIVE Sensitive     NITROFURANTOIN 32 SENSITIVE Sensitive     OXACILLIN <=0.25 SENSITIVE Sensitive     TETRACYCLINE <=1 SENSITIVE Sensitive     VANCOMYCIN 1 SENSITIVE Sensitive     TRIMETH/SULFA <=10 SENSITIVE Sensitive     CLINDAMYCIN <=0.25 SENSITIVE Sensitive     RIFAMPIN <=0.5 SENSITIVE Sensitive     Inducible Clindamycin NEGATIVE Sensitive     * 30,000 COLONIES/mL STAPHYLOCOCCUS AUREUS     Radiology Studies: No results found.  Scheduled Meds: . calcium carbonate  1,000 mg of elemental calcium Oral BID WC  . cephALEXin  500 mg Oral Q12H  . enoxaparin (LOVENOX) injection  40 mg Subcutaneous Q12H  . feeding supplement  1 Container Oral TID BM  . ferrous sulfate  325 mg Oral Q breakfast  . gabapentin  600 mg Oral BID  . levETIRAcetam  250 mg Oral BID  . morphine  30 mg Oral Q12H  . multivitamin with minerals  1 tablet Oral Daily  . polyethylene glycol  17 g  Oral BID  . senna-docusate  1 tablet Oral BID   Continuous Infusions:    LOS: 2 days   Time spent: 30 minutes  Lorella Nimrod, MD [facility]  If 7PM-7AM, please contact night-coverage [email or web address]  01/01/2020, 12:37 PM   This record has been created using Systems analyst. Errors have been sought and corrected,but may not always be located. Such creation errors do not reflect on the standard of care.

## 2020-01-01 NOTE — Evaluation (Signed)
Physical Therapy Evaluation Patient Details Name: Deborah Foster MRN: [id number] DOB: [birth year] Today's Date: 01/01/2020   History of Present Illness  49 y.o. year-old w/ hx of stage IV metastatic lung Ca admitted w/ nausea and abd pain on 12/29/19.  CT exam showed possible pancreatitis and pyelonephritis.  Also found to have bilat Cordle superficial venous thrombosis, but not DVT.  Started on IV heparin, then changed to lovenox for 3-6 wks, hx of metastatic breast CA  Clinical Impression  Pt admitted with above diagnosis.  Pt with notable Viall edema, global weakness and incr pain with mobility.  Will need likely need  incr support at home, pt reports her sons will not be able to assist her and her husb works. Her mother cannot assist. Palliative care pending. Will continue to follow in acute setting. See below for rec's and mobility status  Pt currently with functional limitations due to the deficits listed below (see PT Problem List). Pt will benefit from skilled PT to increase their independence and safety with mobility to allow discharge to the venue listed below.      Follow Up Recommendations Home health PT;Supervision for mobility/OOB    Equipment Recommendations  Rolling walker with 5" wheels;3in1 (PT);Wheelchair cushion (measurements PT)    Recommendations for Other Services       Precautions / Restrictions Precautions Precautions: Fall Restrictions Weight Bearing Restrictions: No      Mobility  Bed Mobility Overal bed mobility: Needs Assistance Bed Mobility: Supine to Sit;Sit to Supine     Supine to sit: Min assist;HOB elevated Sit to supine: Min assist;HOB elevated   General bed mobility comments: requires assist with LEs in both directions, incr time. pt attempts to self assist LEs with UEs however cannot complete without assist from therapist    Transfers Overall transfer level: Needs assistance Equipment used: Rolling walker (2 wheeled) Transfers: Sit to/from  Stand Sit to Stand: Min assist         General transfer comment: light assist to rise and stabilize  Ambulation/Gait Ambulation/Gait assistance: Min assist;Min guard Gait Distance (Feet): 80 Feet Assistive device: Rolling walker (2 wheeled) Gait Pattern/deviations: Step-through pattern;Decreased stride length;Trunk flexed     General Gait Details: cues for RW safety, amb ~ 10' without device and pt heavily reliant on UEs/bed rail--furniture amb. overall unsteady gait, requiring min to min/guard for safety  Stairs            Wheelchair Mobility    Modified Rankin (Stroke Patients Only)       Balance Overall balance assessment: Needs assistance Sitting-balance support: Feet supported;No upper extremity supported Sitting balance-Leahy Scale: Fair       Standing balance-Leahy Scale: Poor Standing balance comment: reliant on UEs                             Pertinent Vitals/Pain Pain Assessment: Faces Faces Pain Scale: Hurts even more Pain Location: LEs, abd Pain Descriptors / Indicators: Discomfort;Grimacing;Sore Pain Intervention(s): Limited activity within patient's tolerance;Monitored during session;Repositioned    Home Living Family/patient expects to be discharged to:: Private residence Living Arrangements: Spouse/significant other;Children (15,14 adn 32 yo) Available Help at Discharge: Family Type of Home: House Home Access: Stairs to enter   Technical brewer of Steps: 1 and 1 Home Layout: One level Home Equipment: None      Prior Function Level of Independence: Independent  Hand Dominance        Extremity/Trunk Assessment   Upper Extremity Assessment Upper Extremity Assessment: Generalized weakness    Lower Extremity Assessment Lower Extremity Assessment: Generalized weakness       Communication   Communication: No difficulties  Cognition Arousal/Alertness: Awake/alert Behavior During Therapy: WFL  for tasks assessed/performed Overall Cognitive Status: Within Functional Limits for tasks assessed                                        General Comments      Exercises     Assessment/Plan    PT Assessment Patient needs continued PT services  PT Problem List Decreased strength;Decreased mobility;Decreased activity tolerance;Decreased knowledge of use of DME;Pain;Decreased balance;Decreased range of motion       PT Treatment Interventions DME instruction;Therapeutic activities;Gait training;Functional mobility training;Therapeutic exercise;Patient/family education;Balance training    PT Goals (Current goals can be found in the Care Plan section)  Acute Rehab PT Goals Patient Stated Goal: to go home PT Goal Formulation: With patient Time For Goal Achievement: 01/15/20 Potential to Achieve Goals: Fair    Frequency Min 3X/week   Barriers to discharge        Co-evaluation               AM-PAC PT "6 Clicks" Mobility  Outcome Measure Help needed turning from your back to your side while in a flat bed without using bedrails?: A Little Help needed moving from lying on your back to sitting on the side of a flat bed without using bedrails?: A Little Help needed moving to and from a bed to a chair (including a wheelchair)?: A Little Help needed standing up from a chair using your arms (e.g., wheelchair or bedside chair)?: A Little Help needed to walk in hospital room?: A Little Help needed climbing 3-5 steps with a railing? : A Lot 6 Click Score: 17    End of Session Equipment Utilized During Treatment: Gait belt Activity Tolerance: Patient limited by fatigue;Patient limited by pain Patient left: in bed;with call bell/phone within reach;with bed alarm set Nurse Communication: Mobility status PT Visit Diagnosis: Other abnormalities of gait and mobility (R26.89);Muscle weakness (generalized) (M62.81)    Time: 4401-0272 PT Time Calculation (min) (ACUTE  ONLY): 32 min   Charges:   PT Evaluation $PT Eval Low Complexity: 1 Low PT Treatments $Gait Training: 8-22 mins        Baxter Flattery, PT  Acute Rehab Dept ([place]) [phone] Pager [phone]  01/01/2020   [facility] 01/01/2020, 5:32 PM

## 2020-01-02 ENCOUNTER — Encounter (HOSPITAL_COMMUNITY): Payer: Self-pay | Admitting: Internal Medicine

## 2020-01-02 LAB — CBC
HCT: 28 % — ABNORMAL LOW (ref 36.0–46.0)
Hemoglobin: 9.5 g/dL — ABNORMAL LOW (ref 12.0–15.0)
MCH: 29.9 pg (ref 26.0–34.0)
MCHC: 33.9 g/dL (ref 30.0–36.0)
MCV: 88.1 fL (ref 80.0–100.0)
Platelets: 92 10*3/uL — ABNORMAL LOW (ref 150–400)
RBC: 3.18 MIL/uL — ABNORMAL LOW (ref 3.87–5.11)
RDW: 15.9 % — ABNORMAL HIGH (ref 11.5–15.5)
WBC: 8.8 10*3/uL (ref 4.0–10.5)
nRBC: 0 % (ref 0.0–0.2)

## 2020-01-02 LAB — BASIC METABOLIC PANEL
Anion gap: 9 (ref 5–15)
BUN: 8 mg/dL (ref 6–20)
CO2: 25 mmol/L (ref 22–32)
Calcium: 7.7 mg/dL — ABNORMAL LOW (ref 8.9–10.3)
Chloride: 95 mmol/L — ABNORMAL LOW (ref 98–111)
Creatinine, Ser: 0.63 mg/dL (ref 0.44–1.00)
GFR, Estimated: >60 mL/min (ref >60)
Glucose, Bld: 108 mg/dL — ABNORMAL HIGH (ref 70–99)
Potassium: 4.2 mmol/L (ref 3.5–5.1)
Sodium: 129 mmol/L — ABNORMAL LOW (ref 135–145)

## 2020-01-02 LAB — CORTISOL-AM, BLOOD: Cortisol - AM: 23.2 ug/dL — ABNORMAL HIGH (ref 6.7–22.6)

## 2020-01-02 LAB — SODIUM: Sodium: 125 mmol/L — ABNORMAL LOW (ref 135–145)

## 2020-01-02 MED ORDER — CALCIUM CARBONATE 1250 (500 CA) MG PO TABS
1000.0000 mg | ORAL_TABLET | Freq: Every day | ORAL | Status: DC
  Administered 2020-01-04: 11:00:00 1000 mg via ORAL
  Filled 2020-01-02 (×2): qty 1

## 2020-01-02 MED ORDER — FUROSEMIDE 20 MG PO TABS
20.0000 mg | ORAL_TABLET | Freq: Every day | ORAL | Status: DC
  Administered 2020-01-02 – 2020-01-04 (×3): 20 mg via ORAL
  Filled 2020-01-02 (×3): qty 1

## 2020-01-02 NOTE — Progress Notes (Signed)
PROGRESS NOTE    Deborah Foster  CWC:[id number] DOB: 1970/05/27 DOA: 12/29/2019 PCP: [name], MD   Brief Narrative: Taken from H&P. Deborah Foster is a 49 y.o. female with medical history significant of stage IV metastatic breast cancer follows with Dr. Julien Nordmann, prior treatments with radiotherapy to the brain as well as metastatic bone lesions in the pelvis, status post Tarceva, currently on Tagrisso, Xgeva.  Patient has also been complaining of lower suprapubic abdominal pain along with nausea.  She was also having some difficulty with urination.  CT abdomen with progressive intrahepatic and CBD dye lesion, and questioning CBD stone and findings concerning for pyelonephritis and acute pancreatitis.  Also found to have bilateral lower extremity superficial venous thrombosis, no DVT.  He was started on heparin infusion, after talking with oncology transition to Lovenox and she will need it for 3 to 6 weeks. Also found to have significant hyponatremia.  Subjective: Patient continued to feel weak.  Stating that she cannot walk by herself. Sodium with some improvement today, asking for home health services which were ordered.  She was also asking for a letter for her husband's work on discharge.  Assessment & Plan:   Active Problems:   Hyponatremia   Elevated LFTs  Hyponatremia.  Initial labs are consistent with hypovolemic hyponatremia and she received fluid in the beginning with some appropriate response until yesterday morning, sodium started trending down despite being given normal saline and she also developed lower extremity edema. 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 Patient has persistent hyponatremia since October 2021. Nephrology was consulted and she received a dose of tolvaptan yesterday with improvement in her sodium to 129 this morning.  Nephrology is recommending fluid  restriction and Lasix as needed. -Give her Lasix 20 mg daily p.o. for 3 to 4 days and then as needed. -Monitor sodium.  Hypokalemia and hypomagnesemia.  Resolved. -Monitor electrolytes and replete as needed.  Hypocalcemia.  Resolved after getting IV calcium yesterday, corrected calcium of 9.3 today. -Continue p.o calcium supplement.  Sinus tachycardia.  Patient with volume depletion and advanced malignancy. TSH within lower normal limit.  Blood pressure improved today. -We will add low-dose metoprolol. -Continue to monitor  UTI/pyelonephritis.  CT abdomen concerning for pyelonephritis.  Urine cultures with staph aureus and good sensitivity.  She was started on ceftriaxone. -Discontinue ceftriaxone and start her on Keflex to complete a 5-day course.  Superficial venous thrombosis.  Doppler studies with superficial venous thrombosis involving both lower extremities, no DVT.  She was started on heparin infusion.  Discussed with Dr. Julien Nordmann at oncology and he advised to transition to Lovenox, she will need Lovenox for 3 to 6 weeks.  Heparin was discontinued and she was started on Lovenox. -Continue with Lovenox  Anemia of chronic disease.  Hemoglobin at 9.5 today. -Continue to monitor  Thrombocytopenia.  No active sign of bleeding.  Started improving. -Continue to monitor.  Seizure disorder.  No acute concern. -Continue home dose of Keppra.  CT abdomen with pancreatic inflammation.  Lipase within normal limit.  She had similar findings on CT scan before.  Thought to be secondary to radiation therapy.  No abdominal pain.  Progressive intrahepatic and CBD dilation.  RUQ ultrasound with CBD but no stone.  No pain, nausea or vomiting. -Can obtain GI consult if needed.  Severe malnutrition/hypoalbuminemia.  With advanced cancer and poor p.o. intake. -Dietary consult.  Generalized  weakness.  Likely multifactorial with advanced malignancy, poor p.o. intake and hyponatremia. -PE evaluation  -recommending home health services which were ordered. -Also need palliative care consult-as she is very high risk for deterioration and death, I will defer that decision for her oncologist.  Objective: Vitals:   01/01/20 2029 01/02/20 0643 01/02/20 1030 01/02/20 1200  BP: (!) 93/55 110/68 111/66   Pulse: (!) 127 (!) 135 (!) 128   Resp: 14 16 16    Temp: 97.6 F (36.4 C) (!) 97.3 F (36.3 C) (!) 97.3 F (36.3 C)   TempSrc: Oral Oral Oral   SpO2: 96% 96% 95%   Weight:    42.6 kg  Height:        Intake/Output Summary (Last 24 hours) at 01/02/2020 1338 Last data filed at 01/02/2020 1247 Gross per 24 hour  Intake 170 ml  Output 350 ml  Net -180 ml   Filed Weights   12/29/19 0942 01/02/20 1200  Weight: 36.2 kg 42.6 kg    Examination:  General.  Emaciated lady, in no acute distress. Pulmonary.  Lungs clear bilaterally, normal respiratory effort. CV.  Regular rate and rhythm, no JVD, rub or murmur. Abdomen.  Soft, nontender, nondistended, BS positive. CNS.  Alert and oriented x3.  No focal neurologic deficit. Extremities.  1+ [place] edema, no cyanosis, pulses intact and symmetrical. Psychiatry.  Judgment and insight appears normal.  DVT prophylaxis: Lovenox Code Status: Full Family Communication: Discussed with patient Disposition Plan:  Status is: Inpatient  Remains inpatient appropriate because:Inpatient level of care appropriate due to severity of illness   Dispo: The patient is from: Home              Anticipated d/c is to: Home              Anticipated d/c date is: 1 day              Patient currently is not medically stable to d/c.    Consultants:   Oncology  Nephrology  Procedures:  Antimicrobials:  Keflex  Data Reviewed: I have personally reviewed following labs and imaging studies  CBC: Recent Labs  Lab 12/29/19 1035 12/30/19 0448 12/31/19 0549 01/01/20 0610 01/02/20 0832  WBC 6.4 7.4 7.6 7.8 8.8  HGB 10.1* 9.0* 9.4* 8.6* 9.5*  HCT 29.3*  26.2* 28.8* 26.0* 28.0*  MCV 85.4 86.2 88.6 88.4 88.1  PLT 63* 64* 80* 87* 92*   Basic Metabolic Panel: Recent Labs  Lab 12/29/19 1035 12/30/19 0448 12/31/19 0549 12/31/19 1526 01/01/20 0610 01/01/20 1655 01/02/20 0018 01/02/20 0832  NA 120*   < > 127* 125* 126* 126* 125* 129*  K 2.3*   < > 3.3* 3.7 3.8 3.8  --  4.2  CL 71*   < > 92* 89* 94* 95*  --  95*  CO2 36*   < > 21* 26 22 25   --  25  GLUCOSE 116*   < > 70 104* 105* 112*  --  108*  BUN 13   < > 8 10 7 8   --  8  CREATININE 0.38*   < > 0.44 0.75 0.39* 0.41*  --  0.63  CALCIUM 7.8*   < > 6.3* 6.6* 6.3* 7.0*  --  7.7*  MG 2.2  --   --   --   --   --   --   --   PHOS 2.7  --   --   --   --   --   --   --    < > =  values in this interval not displayed.   GFR: Estimated Creatinine Clearance: 58.5 mL/min (by C-G formula based on SCr of 0.63 mg/dL). Liver Function Tests: Recent Labs  Lab 12/29/19 1035 12/30/19 0448 01/01/20 1655  AST 22 25 18   ALT 13 14 11   ALKPHOS 82 74 67  BILITOT 0.6 1.1 0.8  PROT 5.3* 4.7* 4.4*  ALBUMIN 2.4* 2.2* 2.0*   Recent Labs  Lab 12/29/19 1035  LIPASE 17   Recent Labs  Lab 12/30/19 0032  AMMONIA 21   Coagulation Profile: Recent Labs  Lab 12/29/19 2040  INR 1.4*   Cardiac Enzymes: No results for input(s): CKTOTAL, CKMB, CKMBINDEX, TROPONINI in the last 168 hours. BNP (last 3 results) No results for input(s): PROBNP in the last 8760 hours. HbA1C: No results for input(s): HGBA1C in the last 72 hours. CBG: Recent Labs  Lab 12/29/19 1011  GLUCAP 116*   Lipid Profile: No results for input(s): CHOL, HDL, LDLCALC, TRIG, CHOLHDL, LDLDIRECT in the last 72 hours. Thyroid Function Tests: No results for input(s): TSH, T4TOTAL, FREET4, T3FREE, THYROIDAB in the last 72 hours. Anemia Panel: No results for input(s): VITAMINB12, FOLATE, FERRITIN, TIBC, IRON, RETICCTPCT in the last 72 hours. Sepsis Labs: Recent Labs  Lab 12/29/19 1035  LATICACIDVEN 1.0    Recent Results (from  the past 240 hour(s))  Resp Panel by RT-PCR (Flu A&B, Covid) Nasopharyngeal Swab     Status: None   Collection Time: 12/29/19  5:13 PM   Specimen: Nasopharyngeal Swab; Nasopharyngeal(NP) swabs in vial transport medium  Result Value Ref Range Status   SARS Coronavirus 2 by RT PCR NEGATIVE NEGATIVE Final    Comment: (NOTE) SARS-CoV-2 target nucleic acids are NOT DETECTED.  The SARS-CoV-2 RNA is generally detectable in upper respiratory specimens during the acute phase of infection. The lowest concentration of SARS-CoV-2 viral copies this assay can detect is 138 copies/mL. A negative result does not preclude SARS-Cov-2 infection and should not be used as the sole basis for treatment or other patient management decisions. A negative result may occur with  improper specimen collection/handling, submission of specimen other than nasopharyngeal swab, presence of viral mutation(s) within the areas targeted by this assay, and inadequate number of viral copies(<138 copies/mL). A negative result must be combined with clinical observations, patient history, and epidemiological information. The expected result is Negative.  Fact Sheet for Patients:  EntrepreneurPulse.com.au  Fact Sheet for Healthcare Providers:  IncredibleEmployment.be  This test is no t yet approved or cleared by the [place] FDA and  has been authorized for detection and/or diagnosis of SARS-CoV-2 by FDA under an Emergency Use Authorization (EUA). This EUA will remain  in effect (meaning this test can be used) for the duration of the COVID-19 declaration under Section 564(b)(1) of the Act, 21 U.S.C.section 360bbb-3(b)(1), unless the authorization is terminated  or revoked sooner.       Influenza A by PCR NEGATIVE NEGATIVE Final   Influenza B by PCR NEGATIVE NEGATIVE Final    Comment: (NOTE) The Xpert Xpress SARS-CoV-2/FLU/RSV plus assay is intended as an aid in the diagnosis of  influenza from Nasopharyngeal swab specimens and should not be used as a sole basis for treatment. Nasal washings and aspirates are unacceptable for Xpert Xpress SARS-CoV-2/FLU/RSV testing.  Fact Sheet for Patients: EntrepreneurPulse.com.au  Fact Sheet for Healthcare Providers: IncredibleEmployment.be  This test is not yet approved or cleared by the [place] FDA and has been authorized for detection and/or diagnosis of SARS-CoV-2 by FDA under an Emergency  Use Authorization (EUA). This EUA will remain in effect (meaning this test can be used) for the duration of the COVID-19 declaration under Section 564(b)(1) of the Act, 21 U.S.C. section 360bbb-3(b)(1), unless the authorization is terminated or revoked.  Performed at [facility], [place] [address], [place], [place] [address]   Culture, Urine     Status: Abnormal   Collection Time: 12/29/19  6:42 PM   Specimen: Urine, Clean Catch  Result Value Ref Range Status   Specimen Description   Final    URINE, CLEAN CATCH Performed at [facility], [place] [address], [place], [place] [address]    Special Requests   Final    NONE Performed at [facility], [place] [address] [place], [place] [address]    Culture 30,000 COLONIES/mL STAPHYLOCOCCUS AUREUS (A)  Final   Report Status 12/31/2019 FINAL  Final   Organism ID, Bacteria STAPHYLOCOCCUS AUREUS (A)  Final      Susceptibility   Staphylococcus aureus - MIC*    CIPROFLOXACIN <=0.5 SENSITIVE Sensitive     GENTAMICIN <=0.5 SENSITIVE Sensitive     NITROFURANTOIN 32 SENSITIVE Sensitive     OXACILLIN <=0.25 SENSITIVE Sensitive     TETRACYCLINE <=1 SENSITIVE Sensitive     VANCOMYCIN 1 SENSITIVE Sensitive     TRIMETH/SULFA <=10 SENSITIVE Sensitive     CLINDAMYCIN <=0.25 SENSITIVE Sensitive     RIFAMPIN <=0.5 SENSITIVE Sensitive     Inducible Clindamycin NEGATIVE Sensitive     * 30,000 COLONIES/mL  STAPHYLOCOCCUS AUREUS     Radiology Studies: No results found.  Scheduled Meds: . calcium carbonate  1,000 mg of elemental calcium Oral BID WC  . cephALEXin  500 mg Oral Q12H  . enoxaparin (LOVENOX) injection  40 mg Subcutaneous Q12H  . feeding supplement  1 Container Oral TID BM  . ferrous sulfate  325 mg Oral Q breakfast  . gabapentin  600 mg Oral BID  . levETIRAcetam  250 mg Oral BID  . morphine  30 mg Oral Q12H  . multivitamin with minerals  1 tablet Oral Daily  . polyethylene glycol  17 g Oral BID  . senna-docusate  1 tablet Oral BID   Continuous Infusions:    LOS: 3 days   Time spent: 25 minutes  Lorella Nimrod, MD [facility]  If 7PM-7AM, please contact night-coverage [email or web address]  01/02/2020, 1:38 PM   This record has been created using Systems analyst. Errors have been sought and corrected,but may not always be located. Such creation errors do not reflect on the standard of care.

## 2020-01-02 NOTE — Progress Notes (Signed)
ANTICOAGULATION CONSULT NOTE - Follow Up Consult  Pharmacy Consult for IV heparin Indication: Acute DVT with hx DVT in March 2020  Allergies  Allergen Reactions  . Gabapentin Other (See Comments)    Abdominal pain  . Other Other (See Comments)    Patient states that there is some she takes that makes her throat "cry" AND SCRATCHY   . Tramadol Nausea And Vomiting    Lowers seizure threshold  . Doxycycline Nausea And Vomiting    Patient Measurements: Height: 4\' 11"  (149.9 cm) Weight: 36.2 kg (79 lb 14.4 oz) IBW/kg (Calculated) : 43.2 Heparin Dosing Weight: actual body weight   Vital Signs: Temp: 97.3 F (36.3 C) (12/18 0643) Temp Source: Oral (12/18 0643) BP: 110/68 (12/18 0643) Pulse Rate: 135 (12/18 0643)  Labs: Recent Labs    12/31/19 0549 12/31/19 1526 01/01/20 0610 01/01/20 1655 01/02/20 0832  HGB 9.4*  --  8.6*  --  9.5*  HCT 28.8*  --  26.0*  --  28.0*  PLT 80*  --  87*  --  92*  CREATININE 0.44   < > 0.39* 0.41* 0.63   < > = values in this interval not displayed.    Estimated Creatinine Clearance: 49.7 mL/min (by C-G formula based on SCr of 0.63 mg/dL).  Assessment: 21 y/oF with PMH of stage IV metastatic breast cancer, DVT in 2020 who presented to [facility] ED with left-sided flank pain and nausea. Bilateral lower extremity venous duplex today + for acute superficial vein thrombosis involving the right and left great saphenous vein and SFJ. Pharmacy consulted for IV heparin dosing. Of note, pharmacy medication reconciliation technician confirmed with patient (with the help of patient's brother as an interpreter) that she is still taking Xarelto 20mg  PO daily (last dose 12/28/2019 at 1800). Baseline heparin level elevated, which likely confirms recent DOAC use. Admission CBC significant for new thrombocytopenia-discussed with Lang Snow, NP with [facility], who approved use of IV heparin, but requested no bolus and targeting lower therapeutic range. Patient was converted to  Lovenox on 12/15  01/02/2020  SCr 0.63  CBC stable3  No bleeding reported  Plan:   Continue Lovenox 40 mg q 12 hours  Will sign off and monitor remotely  Thank you for the consult  Napoleon Form  01/02/2020,10:02 AM

## 2020-01-02 NOTE — Progress Notes (Signed)
[facility] Progress Note  Subjective: seen in room, Na 125 > 129 this am.  Had tolvaptan yest, no sig UOP recorded.   Vitals:   01/01/20 1556 01/01/20 2029 01/02/20 0643 01/02/20 1030  BP: (!) 110/57 (!) 93/55 110/68 111/66  Pulse: (!) 122 (!) 127 (!) 135 (!) 128  Resp: 18 14 16    Temp: 97.6 F (36.4 C) 97.6 F (36.4 C) (!) 97.3 F (36.3 C) (!) 97.3 F (36.3 C)  TempSrc: Oral Oral Oral Oral  SpO2: 96% 96% 96% 95%  Weight:      Height:        Exam: Gen frail , pleasant Asian adult female No rash, cyanosis or gangrene Sclera anicteric, throat clear  No jvd or bruits Chest clear bilat to bases no rales or wheezing RRR no MRG Abd soft ntnd no mass or ascites +bs GU deferred MS no joint effusions or deformity Ext 1-2+ pitting Riolo and hip edema, no wounds or ulcers Neuro is alert, Ox 3 , nf    Home meds:  - lasix 40/80 qd prn  - MS contin 30 bid/ keppra 250 bid/ dialudid 4mg  qid prn/ neurontin 600 bid / voltaren gel prn  - lactulose bid prn/ zofran prn/ senokot prn/ miralax prn  - tagrisso/ decadron (chemoRx) periodically    12/30/19 - UNa 20, Uosm 627    UA 12/14 - negative    BP 111/67  HR 118  RR 14   98.4      Na 126  K 3.8  CO2 22  BUN 7  Cr 0.39  AF 10  Alb 2.2  Tprot 4.7     WBC 7K  Hb 8.6  plt 87 K  Assessment/ Plan: 1. Hyponatremia - initially hypovolemic and improved w/ 0.9% NS up to 127, then Na down to 125. Pt now is edematous, happened in hospital per pt (after vol loading). Did not really respond well to tolvaptan. Na+ 129 now, better. Suspect SIADH related to cancer. Recommend fluid restriction 1000 cc/ day for now.  Also consider a 3-5 day course of low dose po lasix 20 bid for edema.  Should have her labs checked in OP setting about 5-7 days after dc.  Will sign off.  2. Stage IV lung cancer 3. UTI/ pyelonephritis - on ancef IV 4. Sinus tach 5. Superficial venous thrombosis - on lovenox 6. Anemia of chronic disease - Hb 8-9  range     Rob Carden Teel 01/02/2020, 10:59 AM   Recent Labs  Lab 12/29/19 1035 12/30/19 0448 01/01/20 0610 01/01/20 1655 01/02/20 0832  K 2.3*   < > 3.8 3.8 4.2  BUN 13   < > 7 8 8   CREATININE 0.38*   < > 0.39* 0.41* 0.63  CALCIUM 7.8*   < > 6.3* 7.0* 7.7*  PHOS 2.7  --   --   --   --   HGB 10.1*   < > 8.6*  --  9.5*   < > = values in this interval not displayed.   Inpatient medications: . calcium carbonate  1,000 mg of elemental calcium Oral BID WC  . cephALEXin  500 mg Oral Q12H  . enoxaparin (LOVENOX) injection  40 mg Subcutaneous Q12H  . feeding supplement  1 Container Oral TID BM  . ferrous sulfate  325 mg Oral Q breakfast  . gabapentin  600 mg Oral BID  . levETIRAcetam  250 mg Oral BID  . morphine  30 mg Oral Q12H  .  multivitamin with minerals  1 tablet Oral Daily  . polyethylene glycol  17 g Oral BID  . senna-docusate  1 tablet Oral BID    fentaNYL (SUBLIMAZE) injection, hydrocortisone, HYDROmorphone, lactulose, ondansetron

## 2020-01-03 ENCOUNTER — Other Ambulatory Visit (HOSPITAL_COMMUNITY): Payer: Self-pay | Admitting: Internal Medicine

## 2020-01-03 LAB — RENAL FUNCTION PANEL
Albumin: 2.1 g/dL — ABNORMAL LOW (ref 3.5–5.0)
Anion gap: 10 (ref 5–15)
BUN: 7 mg/dL (ref 6–20)
CO2: 24 mmol/L (ref 22–32)
Calcium: 7.4 mg/dL — ABNORMAL LOW (ref 8.9–10.3)
Chloride: 98 mmol/L (ref 98–111)
Creatinine, Ser: 0.45 mg/dL (ref 0.44–1.00)
GFR, Estimated: >60 mL/min (ref >60)
Glucose, Bld: 113 mg/dL — ABNORMAL HIGH (ref 70–99)
Phosphorus: 2.6 mg/dL (ref 2.5–4.6)
Potassium: 3.4 mmol/L — ABNORMAL LOW (ref 3.5–5.1)
Sodium: 132 mmol/L — ABNORMAL LOW (ref 135–145)

## 2020-01-03 LAB — CBC
HCT: 25.1 % — ABNORMAL LOW (ref 36.0–46.0)
Hemoglobin: 8.2 g/dL — ABNORMAL LOW (ref 12.0–15.0)
MCH: 29.1 pg (ref 26.0–34.0)
MCHC: 32.7 g/dL (ref 30.0–36.0)
MCV: 89 fL (ref 80.0–100.0)
Platelets: 95 10*3/uL — ABNORMAL LOW (ref 150–400)
RBC: 2.82 MIL/uL — ABNORMAL LOW (ref 3.87–5.11)
RDW: 16.3 % — ABNORMAL HIGH (ref 11.5–15.5)
WBC: 7.5 10*3/uL (ref 4.0–10.5)
nRBC: 0 % (ref 0.0–0.2)

## 2020-01-03 MED ORDER — ADULT MULTIVITAMIN W/MINERALS CH: 1.0000 | ORAL_TABLET | Freq: Every day | ORAL | Status: AC

## 2020-01-03 MED ORDER — FUROSEMIDE 10 MG/ML IJ SOLN
20.0000 mg | Freq: Once | INTRAMUSCULAR | Status: AC
  Administered 2020-01-03: 16:00:00 20 mg via INTRAVENOUS
  Filled 2020-01-03: qty 2

## 2020-01-03 MED ORDER — HYDROCORTISONE (PERIANAL) 2.5 % EX CREA: TOPICAL_CREAM | CUTANEOUS | 0 refills | Status: DC | PRN

## 2020-01-03 NOTE — TOC Progression Note (Signed)
Transition of Care [facility]) - Progression Note    Patient Details  Name: Deborah Foster MRN: [id number] Date of Birth: November 16, 1970  [facility] CM/SW Contact  Joaquin Courts, RN Phone Number: 01/03/2020, 3:17 PM  Clinical Narrative:    Adapt to delivered rolling walker to bedside for home use.  [facility] to provide HHPT services.   Expected Discharge Plan: [place] Barriers to Discharge: No Barriers Identified  Expected Discharge Plan and Services Expected Discharge Plan: [place]         Expected Discharge Date: 01/03/20               DME Arranged: [place]ford Rile rolling DME Agency: [employer] Date DME Agency Contacted: 01/03/20 Time DME Agency Contacted: [phone] Representative spoke with at DME Agency: [place]: PT [place]: [place] ([employer] [place]) Date [place]: 01/03/20 Time [place]: 1516 Representative spoke with at [place]: [place] ([place]) Interventions    Readmission Risk Interventions Readmission Risk Prevention Plan 12/31/2019 11/24/2019  Transportation Screening Complete -  PCP or Specialist Appt within 3-5 Days - Complete  [place]RI or [place] - Complete  Palliative Care Screening - Complete  Medication Review (RN Care Manager) Complete Complete  PCP or Specialist appointment within 3-5 days of discharge Not Complete -  PCP/Specialist Appt Not Complete comments Not ready for dc yet -  [place] or [place] Not Complete -  [place] or Home Care Consult Pt Refusal Comments Not needed -  SW Recovery Care/Counseling Consult Complete -  Palliative Care Screening Not Applicable -  [place] Not Applicable -  Some recent data might be hidden

## 2020-01-03 NOTE — Progress Notes (Signed)
PROGRESS NOTE    Deborah Foster  FBP:[id number] DOB: 1970-07-19 DOA: 12/29/2019 PCP: [name], MD    Brief Narrative- Taken from H&P. Deborah Foster a 49 y.o.femalewith medical history significant ofstage IV metastatic breast cancer follows with Dr. Julien Nordmann, prior treatments with radiotherapy to the brain as well as metastatic bone lesions in the pelvis, status post Tarceva, currently on Tagrisso, Xgeva.Patient has also been complaining of lower suprapubic abdominal pain along with nausea.  She was also having some difficulty with urination.  CT abdomen with progressive intrahepatic and CBD dye lesion, and questioning CBD stone and findings concerning for pyelonephritis and acute pancreatitis.  Also found to have bilateral lower extremity superficial venous thrombosis, no DVT.  He was started on heparin infusion, after talking with oncology transition to Lovenox and she will need it for 3 to 6 weeks. Also found to have significant hyponatremia. Assessment & Plan:   Active Problems:   Hyponatremia   Elevated LFTs   Hyponatremia.  Initial labs are consistent with hypovolemic hyponatremia and she received fluid in the beginning with some appropriate response until yesterday morning, sodium started trending down despite being given normal saline and she also developed lower extremity edema. 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 Patient has persistent hyponatremia since October 2021. Nephrology was consulted and she received a dose of tolvaptan yesterday with improvement in her sodium to 129 this morning.  Nephrology is recommending fluid restriction and Lasix as needed. -Give her Lasix 20 mg daily p.o. for 3 to 4 days and then as needed. -Monitor sodium.  Sodium 132 today.  Hypokalemia and hypomagnesemia.  Resolved. -Monitor electrolytes and replete as needed.  Hypocalcemia.   Resolved after getting IV calcium yesterday, corrected calcium of 9.3 today. -Continue p.o calcium supplement.  Sinus tachycardia.  Patient with volume depletion and advanced malignancy. TSH within lower normal limit.  Blood pressure improved today. -We will add low-dose metoprolol. -Continue to monitor  UTI/pyelonephritis.  CT abdomen concerning for pyelonephritis.  Urine cultures with staph aureus and good sensitivity.  She was started on ceftriaxone. -Discontinue ceftriaxone and start her on Keflex to complete a 5-day course.  Superficial venous thrombosis.  Doppler studies with superficial venous thrombosis involving both lower extremities, no DVT.  She was started on heparin infusion.  Discussed with Dr. Julien Nordmann at oncology and he advised to transition to Lovenox, she will need Lovenox for 3 to 6 weeks.  Heparin was discontinued and she was started on Lovenox. -Continue with Lovenox  Anemia of chronic disease.  Hemoglobin at 9.5 today. -Continue to monitor  Thrombocytopenia.  No active sign of bleeding.  Started improving. -Continue to monitor.  Seizure disorder.  No acute concern. -Continue home dose of Keppra.  CT abdomen with pancreatic inflammation.  Lipase within normal limit.  She had similar findings on CT scan before.  Thought to be secondary to radiation therapy.  No abdominal pain.  Progressive intrahepatic and CBD dilation.  RUQ ultrasound with CBD but no stone.  No pain, nausea or vomiting. -Can obtain GI consult if needed.  Severe malnutrition/hypoalbuminemia.  With advanced cancer and poor p.o. intake. -Dietary consult.  Generalized weakness.  Likely multifactorial with advanced malignancy, poor p.o. intake and hyponatremia. -PE evaluation -recommending home health services which were ordered. -Also need palliative care consult-as she is very high risk for deterioration and death, I will defer that decision for her oncologist.  Patient is refusing to be  discharged home today reporting that she is not able to care for her nor her husband is able to care for her.  She does not want to go to a nursing home.  And asking me to keep her in the night.  Pressure injury deep tissue present on admission on sacrum appreciate wound care input.  Pressure Injury 01/02/20 Sacrum Deep Tissue Pressure Injury - Purple or maroon localized area of discolored intact skin or blood-filled blister due to damage of underlying soft tissue from pressure and/or shear. Dark purple area with distinct borders, op (Active)  01/02/20 1000  Location: Sacrum  Location Orientation:   Staging: Deep Tissue Pressure Injury - Purple or maroon localized area of discolored intact skin or blood-filled blister due to damage of underlying soft tissue from pressure and/or shear.  Wound Description (Comments): Dark purple area with distinct borders, open area at bottom. ? SQ tissue  Present on Admission: Yes      Nutrition Problem: Increased nutrient needs Etiology: cancer and cancer related treatments     Signs/Symptoms: estimated needs    Interventions: Boost Breeze,MVI  Estimated body mass index is 19.19 kg/m as calculated from the following:   Height as of this encounter: 4\' 11"  (1.499 m).   Weight as of this encounter: 43.1 kg.  DVT prophylaxis: Lovenox Code Status: Full code Family Communication: Husband in the room  disposition Plan:  Status is: Inpatient   Dispo: The patient is from: Home              Anticipated d/c is to: Home              Anticipated d/c date is: 1 day              Patient currently is medically stable to d/c.  Patient requesting to stay another night and refusing to be discharged.  Consultants:   Oncology and nephrology  Procedures: None  antimicrobials: Keflex  Subjective: She is resting in bed does not want to go home today husband at the bedside taken pictures of her sacral decub which was present on admission appreciate wound  care input  Objective: Vitals:   01/02/20 1748 01/02/20 2021 01/03/20 0559 01/03/20 1011  BP: 106/63 (!) 107/41 108/65 111/67  Pulse: (!) 133 (!) 132 (!) 131 (!) 118  Resp: 16 14 14 18   Temp: 98.5 F (36.9 C) 98.4 F (36.9 C) 98.4 F (36.9 C) 97.6 F (36.4 C)  TempSrc: Oral Oral Oral Oral  SpO2: 96% 98% 95% 96%  Weight:   43.1 kg   Height:        Intake/Output Summary (Last 24 hours) at 01/03/2020 1418 Last data filed at 01/03/2020 2595 Gross per 24 hour  Intake 240 ml  Output 1250 ml  Net -1010 ml   Filed Weights   12/29/19 0942 01/02/20 1200 01/03/20 0559  Weight: 36.2 kg 42.6 kg 43.1 kg    Examination:  General exam: Appears calm and comfortable  Respiratory system: Clear to auscultation. Respiratory effort normal. Cardiovascular system: S1 & S2 heard, RRR. No JVD, murmurs, rubs, gallops or clicks. No pedal edema. Gastrointestinal system: Abdomen is nondistended, soft and nontender. No organomegaly or masses felt. Normal bowel sounds heard. Central nervous system: Alert and oriented. No focal neurological deficits. Extremities: Symmetric 5 x 5 power. Skin: No rashes, lesions or ulcers Psychiatry: Judgement and insight appear normal. Mood & affect appropriate.     Data Reviewed: I have personally  reviewed following labs and imaging studies  CBC: Recent Labs  Lab 12/30/19 0448 12/31/19 0549 01/01/20 0610 01/02/20 0832 01/03/20 0603  WBC 7.4 7.6 7.8 8.8 7.5  HGB 9.0* 9.4* 8.6* 9.5* 8.2*  HCT 26.2* 28.8* 26.0* 28.0* 25.1*  MCV 86.2 88.6 88.4 88.1 89.0  PLT 64* 80* 87* 92* 95*   Basic Metabolic Panel: Recent Labs  Lab 12/29/19 1035 12/30/19 0448 12/31/19 1526 01/01/20 0610 01/01/20 1655 01/02/20 0018 01/02/20 0832 01/03/20 0603  NA 120*   < > 125* 126* 126* 125* 129* 132*  K 2.3*   < > 3.7 3.8 3.8  --  4.2 3.4*  CL 71*   < > 89* 94* 95*  --  95* 98  CO2 36*   < > 26 22 25   --  25 24  GLUCOSE 116*   < > 104* 105* 112*  --  108* 113*  BUN 13   <  > 10 7 8   --  8 7  CREATININE 0.38*   < > 0.75 0.39* 0.41*  --  0.63 0.45  CALCIUM 7.8*   < > 6.6* 6.3* 7.0*  --  7.7* 7.4*  MG 2.2  --   --   --   --   --   --   --   PHOS 2.7  --   --   --   --   --   --  2.6   < > = values in this interval not displayed.   GFR: Estimated Creatinine Clearance: 59.2 mL/min (by C-G formula based on SCr of 0.45 mg/dL). Liver Function Tests: Recent Labs  Lab 12/29/19 1035 12/30/19 0448 01/01/20 1655 01/03/20 0603  AST 22 25 18   --   ALT 13 14 11   --   ALKPHOS 82 74 67  --   BILITOT 0.6 1.1 0.8  --   PROT 5.3* 4.7* 4.4*  --   ALBUMIN 2.4* 2.2* 2.0* 2.1*   Recent Labs  Lab 12/29/19 1035  LIPASE 17   Recent Labs  Lab 12/30/19 0032  AMMONIA 21   Coagulation Profile: Recent Labs  Lab 12/29/19 2040  INR 1.4*   Cardiac Enzymes: No results for input(s): CKTOTAL, CKMB, CKMBINDEX, TROPONINI in the last 168 hours. BNP (last 3 results) No results for input(s): PROBNP in the last 8760 hours. HbA1C: No results for input(s): HGBA1C in the last 72 hours. CBG: Recent Labs  Lab 12/29/19 1011  GLUCAP 116*   Lipid Profile: No results for input(s): CHOL, HDL, LDLCALC, TRIG, CHOLHDL, LDLDIRECT in the last 72 hours. Thyroid Function Tests: No results for input(s): TSH, T4TOTAL, FREET4, T3FREE, THYROIDAB in the last 72 hours. Anemia Panel: No results for input(s): VITAMINB12, FOLATE, FERRITIN, TIBC, IRON, RETICCTPCT in the last 72 hours. Sepsis Labs: Recent Labs  Lab 12/29/19 1035  LATICACIDVEN 1.0    Recent Results (from the past 240 hour(s))  Resp Panel by RT-PCR (Flu A&B, Covid) Nasopharyngeal Swab     Status: None   Collection Time: 12/29/19  5:13 PM   Specimen: Nasopharyngeal Swab; Nasopharyngeal(NP) swabs in vial transport medium  Result Value Ref Range Status   SARS Coronavirus 2 by RT PCR NEGATIVE NEGATIVE Final    Comment: (NOTE) SARS-CoV-2 target nucleic acids are NOT DETECTED.  The SARS-CoV-2 RNA is generally detectable in  upper respiratory specimens during the acute phase of infection. The lowest concentration of SARS-CoV-2 viral copies this assay can detect is 138 copies/mL. A negative result does not preclude SARS-Cov-2  infection and should not be used as the sole basis for treatment or other patient management decisions. A negative result may occur with  improper specimen collection/handling, submission of specimen other than nasopharyngeal swab, presence of viral mutation(s) within the areas targeted by this assay, and inadequate number of viral copies(<138 copies/mL). A negative result must be combined with clinical observations, patient history, and epidemiological information. The expected result is Negative.  Fact Sheet for Patients:  EntrepreneurPulse.com.au  Fact Sheet for Healthcare Providers:  IncredibleEmployment.be  This test is no t yet approved or cleared by the [place] FDA and  has been authorized for detection and/or diagnosis of SARS-CoV-2 by FDA under an Emergency Use Authorization (EUA). This EUA will remain  in effect (meaning this test can be used) for the duration of the COVID-19 declaration under Section 564(b)(1) of the Act, 21 U.S.C.section 360bbb-3(b)(1), unless the authorization is terminated  or revoked sooner.       Influenza A by PCR NEGATIVE NEGATIVE Final   Influenza B by PCR NEGATIVE NEGATIVE Final    Comment: (NOTE) The Xpert Xpress SARS-CoV-2/FLU/RSV plus assay is intended as an aid in the diagnosis of influenza from Nasopharyngeal swab specimens and should not be used as a sole basis for treatment. Nasal washings and aspirates are unacceptable for Xpert Xpress SARS-CoV-2/FLU/RSV testing.  Fact Sheet for Patients: EntrepreneurPulse.com.au  Fact Sheet for Healthcare Providers: IncredibleEmployment.be  This test is not yet approved or cleared by the [place] FDA and has been  authorized for detection and/or diagnosis of SARS-CoV-2 by FDA under an Emergency Use Authorization (EUA). This EUA will remain in effect (meaning this test can be used) for the duration of the COVID-19 declaration under Section 564(b)(1) of the Act, 21 U.S.C. section 360bbb-3(b)(1), unless the authorization is terminated or revoked.  Performed at [facility], [place] [address], [place], [place] [address]   Culture, Urine     Status: Abnormal   Collection Time: 12/29/19  6:42 PM   Specimen: Urine, Clean Catch  Result Value Ref Range Status   Specimen Description   Final    URINE, CLEAN CATCH Performed at [facility], [place] [address], [place], [place] [address]    Special Requests   Final    NONE Performed at [facility], [place] [address], [place], [place] [address]    Culture 30,000 COLONIES/mL STAPHYLOCOCCUS AUREUS (A)  Final   Report Status 12/31/2019 FINAL  Final   Organism ID, Bacteria STAPHYLOCOCCUS AUREUS (A)  Final      Susceptibility   Staphylococcus aureus - MIC*    CIPROFLOXACIN <=0.5 SENSITIVE Sensitive     GENTAMICIN <=0.5 SENSITIVE Sensitive     NITROFURANTOIN 32 SENSITIVE Sensitive     OXACILLIN <=0.25 SENSITIVE Sensitive     TETRACYCLINE <=1 SENSITIVE Sensitive     VANCOMYCIN 1 SENSITIVE Sensitive     TRIMETH/SULFA <=10 SENSITIVE Sensitive     CLINDAMYCIN <=0.25 SENSITIVE Sensitive     RIFAMPIN <=0.5 SENSITIVE Sensitive     Inducible Clindamycin NEGATIVE Sensitive     * 30,000 COLONIES/mL STAPHYLOCOCCUS AUREUS         Radiology Studies: No results found.      Scheduled Meds: . calcium carbonate  1,000 mg of elemental calcium Oral Q breakfast  . cephALEXin  500 mg Oral Q12H  . enoxaparin (LOVENOX) injection  40 mg Subcutaneous Q12H  . feeding supplement  1 Container Oral TID BM  . ferrous sulfate  325 mg  Oral Q breakfast  . furosemide  20 mg Oral Daily  . gabapentin  600 mg Oral BID  .  levETIRAcetam  250 mg Oral BID  . morphine  30 mg Oral Q12H  . multivitamin with minerals  1 tablet Oral Daily  . polyethylene glycol  17 g Oral BID  . senna-docusate  1 tablet Oral BID   Continuous Infusions:   LOS: 4 days   Georgette Shell, MD 01/03/2020, 2:18 PM

## 2020-01-03 NOTE — Consult Note (Signed)
WOC Nurse Consult Note: Reason for Consult: DTPI to sacrum Wound type:Pressure Pressure Injury POA: Yes Measurement:4cm x 5cm purple/maroon discoloration with early sloughing in distal portion  Wound bed:As described abnove Drainage (amount, consistency, odor) scant serosanguinous  Periwound:Intact Dressing procedure/placement/frequency:[place] for Nursing is provided for topical care as well as for turning and repositioning. A mattress replacement with low air loss feature is ordered as are bilateral pressure redistribution heel boots.Topical care will consist of covering the lesion with a antimicrobial nonadherent dressing and covering with a silicone foam. The wound contact layer will be changed twice daily. Side lying positioning with time in the supine position minimized will be the cornerstone of care and [facility] will be elevated no higher than a 30 degree angle as able. This lesion will evolve into a full thickness wound and as it does, modification to the POC will likely be required.    [place] nursing team will not follow, but will remain available to this patient, the nursing and medical teams.  Please re-consult if needed. Thanks, Maudie Flakes, MSN, RN, [place], Arther Abbott  Pager# [phone]

## 2020-01-03 NOTE — Discharge Summary (Addendum)
Physician Discharge Summary  [name] SWH:[id number] DOB: 1970/07/23 DOA: 12/29/2019  PCP: [name], MD  Admit date: 12/29/2019 Discharge date: 01/04/2020 Admitted From: Home Disposition: Home Recommendations for Outpatient Follow-up:  1. Follow up with PCP in 1-2 weeks 2. Please obtain BMP/CBC in one week Please follow up with oncology Home Health: Physical therapy  equipment/Devices: Rolling walker with 5 inch wheels Discharge Condition stable CODE STATUS: Full code Diet recommendation: Regular diet Brief/Interim Summary:Deborah Foster a 49 y.o.femalewith medical history significant ofstage IV metastatic breast cancer follows with Dr. Julien Nordmann, prior treatments with radiotherapy to the brain as well as metastatic bone lesions in the pelvis, status post Tarceva, currently on Tagrisso, Xgeva.Patient has also been complaining of lower suprapubic abdominal pain along with nausea.  She was also having some difficulty with urination.  CT abdomen with progressive intrahepatic and CBD dye lesion, and questioning CBD stone and findings concerning for pyelonephritis and acute pancreatitis.  Also found to have bilateral lower extremity superficial venous thrombosis, no DVT.  He was started on heparin infusion, after talking with oncology transition to Lovenox and she will need it for 3 to 6 weeks. Also found to have significant hyponatremia.  Discharge Diagnoses:  Active Problems:   Hyponatremia   Elevated LFTs  Hyponatremia.  Initial labs were consistent with hypovolemic hyponatremia and she received fluid in the beginning with some appropriate response then , sodium started trending down despite being given normal saline and she also developed lower extremity edema. 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 Patient has persistent hyponatremia since October 2021. Nephrology was  consulted and she received a dose of tolvaptan  with improvement in her sodium to 132 on dc. Nephrology  recommended fluid restriction and Lasix as needed.   Hypokalemia and hypomagnesemia.  Resolved.  Hypocalcemia.  Resolved  Sinus tachycardia.  Patient with volume depletion and advanced malignancy. TSH within lower normal limit.   Resolved.  UTI/pyelonephritis.  CT abdomen concerning for pyelonephritis.  Urine cultures with staph aureus and good sensitivity.  She was treated with ceftriaxone and then with Keflex.   Superficial venous thrombosis.  Doppler studies with superficial venous thrombosis involving both lower extremities, no DVT.    She was on Xarelto prior to admission which will be continued on discharge.    Anemia of chronic disease.  Hemoglobin at 9.5   Thrombocytopenia.   Improving.   Seizure disorder.  No acute concern. -Continue home dose of Keppra.  CT abdomen with pancreatic inflammation.  Lipase within normal limit.  She had similar findings on CT scan before.  Thought to be secondary to radiation therapy.  No abdominal pain.  Progressive intrahepatic and CBD dilation.  RUQ ultrasound with CBD but no stone.  No pain, nausea or vomiting.  Severe malnutrition/hypoalbuminemia.  With advanced cancer and poor p.o. intake.  Generalized weakness.  Likely multifactorial with advanced malignancy, poor p.o. intake and hyponatremia. -PE evaluation -recommending home health services which were ordered. -This patient is at high risk for recurrent hospital admissions.  Deep tissue pressure injury present on admission at the sacral area seen by wound care.  Appreciate wound care input.  Pressure Injury 01/02/20 Sacrum Deep Tissue Pressure Injury - Purple or maroon localized area of discolored intact skin or blood-filled blister due to damage of underlying soft tissue from pressure and/or shear. Dark purple area with distinct borders, op (Active)  01/02/20 1000  Location: Sacrum  Location Orientation:   Staging: Deep Tissue Pressure Injury - Purple or maroon localized area of discolored intact skin or blood-filled blister due to damage of underlying soft tissue from pressure and/or shear.  Wound Description (Comments): Dark purple area with distinct borders, open area at bottom. ? SQ tissue  Present on Admission: Yes      Nutrition Problem: Increased nutrient needs Etiology: cancer and cancer related treatments    Signs/Symptoms: estimated needs     Interventions: Boost Breeze,MVI  Estimated body mass index is 19.19 kg/m as calculated from the following:   Height as of this encounter: 4\' 11"  (1.499 m).   Weight as of this encounter: 43.1 kg.  Discharge Instructions  Discharge Instructions    Diet - low sodium heart healthy   Complete by: As directed    Discharge wound care:   Complete by: As directed    Wound care to deep tissue pressure injury at sacrum present on admission-cleanse with normal saline pat dry and cover the affected area with Xeroform gauze top with dry gauze and covered with silicone foam for sacrum with point oriented upward away from the rectum.  Change to Xeroform twice daily may change silicone foam every 2 to 3 days and as needed incontinence.   Increase activity slowly   Complete by: As directed      Allergies as of 01/03/2020      Reactions   Gabapentin Other (See Comments)   Abdominal pain   Other Other (See Comments)   Patient states that there is some she takes that makes her throat "cry" AND SCRATCHY    Tramadol Nausea And Vomiting   Lowers seizure threshold   Doxycycline Nausea And Vomiting      Medication List    STOP taking these medications   dexamethasone 4 MG tablet Commonly known as: DECADRON     TAKE these medications   calcium-vitamin D 500-200 MG-UNIT tablet Commonly known as: OSCAL WITH D Take 1 tablet by mouth 3 (three) times daily.   diclofenac Sodium 1 % Gel Commonly  known as: VOLTAREN Apply 2 g topically 4 (four) times daily.   ferrous sulfate 325 (65 FE) MG tablet Take 325 mg by mouth daily with breakfast.   folic acid 098 MCG tablet Commonly known as: FOLVITE Take 800 mcg by mouth daily.   furosemide 80 MG tablet Commonly known as: LASIX Take 40-80 mg by mouth daily as needed for fluid.   gabapentin 300 MG capsule Commonly known as: NEURONTIN Take 600 mg by mouth 2 (two) times daily.   hydrocortisone 2.5 % rectal cream Commonly known as: ANUSOL-HC Place rectally every 4 (four) hours as needed for hemorrhoids or anal itching.   HYDROmorphone 4 MG tablet Commonly known as: DILAUDID Take 1 tablet (4 mg total) by mouth every 6 (six) hours as needed for severe pain.   lactulose 10 GM/15ML solution Commonly known as: CHRONULAC Take 30 mLs (20 g total) by mouth 2 (two) times daily as needed for moderate constipation.   levETIRAcetam 250 MG tablet Commonly known as: KEPPRA Take 1 tablet (250 mg total) by mouth 2 (two) times daily.   loratadine 10 MG tablet Commonly known as: CLARITIN Take 1 tablet (10 mg total) by mouth daily. What changed:   when to take this  reasons to take this   morphine 30 MG 12 hr tablet Commonly known as: MS CONTIN Take 1 tablet (30 mg total) by mouth every 12 (twelve) hours.  multivitamin with minerals Tabs tablet Take 1 tablet by mouth daily. Start taking on: January 04, 2020   ondansetron 8 MG tablet Commonly known as: ZOFRAN Take 8 mg by mouth every 8 (eight) hours as needed for nausea or vomiting.   polyethylene glycol 17 g packet Commonly known as: MIRALAX / GLYCOLAX Take 17 g by mouth 2 (two) times daily.   senna-docusate 8.6-50 MG tablet Commonly known as: Senokot-S Take 1 tablet by mouth 2 (two) times daily.   Tagrisso 80 MG tablet Generic drug: osimertinib mesylate TAKE 1 TABLET (80 MG TOTAL) BY MOUTH DAILY. What changed: See the new instructions.   Vitamin B-12 2500 MCG  Subl Place 2,500 mcg under the tongue daily.   Xarelto 20 MG Tabs tablet Generic drug: rivaroxaban Take 20 mg by mouth every evening.            Durable Medical Equipment  (From admission, onward)         Start     Ordered   01/03/20 1159  DME Walker  Once       Question Answer Comment  Walker: With Worth Wheels   Patient needs a walker to treat with the following condition Unsteady gait      01/03/20 1201   01/02/20 1351  For home use only DME 4 wheeled rolling walker with seat  Once       Question:  Patient needs a walker to treat with the following condition  Answer:  Generalized weakness   01/02/20 1350           Discharge Care Instructions  (From admission, onward)         Start     Ordered   01/03/20 0000  Discharge wound care:       Comments: Wound care to deep tissue pressure injury at sacrum present on admission-cleanse with normal saline pat dry and cover the affected area with Xeroform gauze top with dry gauze and covered with silicone foam for sacrum with point oriented upward away from the rectum.  Change to Xeroform twice daily may change silicone foam every 2 to 3 days and as needed incontinence.   01/03/20 1201          Allergies  Allergen Reactions  . Gabapentin Other (See Comments)    Abdominal pain  . Other Other (See Comments)    Patient states that there is some she takes that makes her throat "cry" AND SCRATCHY   . Tramadol Nausea And Vomiting    Lowers seizure threshold  . Doxycycline Nausea And Vomiting    Consultations: Nephrology  Procedures/Studies: CT ABDOMEN PELVIS W CONTRAST  Result Date: 12/29/2019 CLINICAL DATA:  Abdominal and back pain. History of metastatic lung cancer. EXAM: CT ABDOMEN AND PELVIS WITH CONTRAST TECHNIQUE: Multidetector CT imaging of the abdomen and pelvis was performed using the standard protocol following bolus administration of intravenous contrast. CONTRAST:  67mL OMNIPAQUE IOHEXOL 300 MG/ML  SOLN  COMPARISON:  CT scan 11/21/2019 and 11/07/2019. FINDINGS: Lower chest: Small right pleural effusion is slightly larger when compared to the prior study. Stable radiation changes involving the right lower lobe. No findings suspicious for recurrent tumor. No metastatic pulmonary lesions are identified. The heart is normal in size. No pericardial effusion. Hepatobiliary: Heterogeneous appearance of the liver parenchyma may be due to an inflammatory process/hepatitis. There is also progressive intrahepatic biliary dilatation and significant common bile duct dilatation. It measures approximately 14.5 mm in the porta hepatis and a maximum of  9 mm in the head of the pancreas. Could not exclude a obstructing common bile duct stone. No obvious gallstones. Pericholecystic fluid is noted. Pancreas: The pancreas is enlarged/prominent and the head region demonstrates diffuse low attenuation. This could represent decreased perfusion due to inflammation. Recommend correlation any clinical findings for pancreatitis. Low-attenuation lesions in the body/tail junction region are also noted may be inflammatory. Spleen: Spleen is small.  No focal lesions. Adrenals/Urinary Tract: Adrenal glands are unremarkable. There are changes consistent with pyelonephritis involving the right kidney in particular most notably in the lower pole region. Is also a ureteral thickening and enhancement bilaterally suggesting Pyo ureteral nephrosis. No obvious bladder wall thickening or bladder mass. Stomach/Bowel: Stomach demonstrates mild uniform wall thickening mucosal enhancement. Similar findings involving the duodenum. Findings could be due to gastritis and duodenitis. No findings for small bowel obstruction colonic mass. Vascular/Lymphatic: The aorta and branch vessels are patent. The major venous structures are patent. No mesenteric or retroperitoneal mass or adenopathy. Small scattered lymph nodes are stable. Reproductive: The uterus and ovaries  are grossly normal. Other: Small to moderate amount of free abdominal/pelvic fluid and diffuse mesenteric edema. Musculoskeletal: Stable sclerotic osseous metastatic disease. No new or progressive findings. IMPRESSION: 1. Progressive intrahepatic and common bile duct dilatation. Could not exclude a obstructing common bile duct stone. Recommend correlation with with liver function studies. 2. CT findings consistent with pyelonephritis and pyoureteronephrosis. 3. Abnormal appearance of the pancreas as detailed above. Recommend correlation with amylase and lipase levels. Possible acute pancreatitis. 4. Small to moderate amount of free abdominal/pelvic fluid and diffuse mesenteric edema. 5. Stable sclerotic osseous metastatic disease. No new or progressive findings. 6. Stable radiation changes involving the right lower lobe. No findings suspicious for recurrent tumor. 7. Heterogeneous appearance of the liver parenchyma may be due to an inflammatory process/hepatitis. 8. Aortic atherosclerosis. Aortic Atherosclerosis (ICD10-I70.0). Electronically Signed   By: Marijo Sanes M.D.   On: 12/29/2019 16:19   VAS [place] LOWER EXTREMITY VENOUS (DVT) (ONLY [facility] & [facility])  Result Date: 12/29/2019  Lower Venous DVT Study Indications: Swelling.  Risk Factors: Cancer. Comparison Study: No prior studies. Performing Technologist: Oliver Hum RVT  Examination Guidelines: A complete evaluation includes B-mode imaging, spectral Doppler, color Doppler, and power Doppler as needed of all accessible portions of each vessel. Bilateral testing is considered an integral part of a complete examination. Limited examinations for reoccurring indications may be performed as noted. The reflux portion of the exam is performed with the patient in reverse Trendelenburg.  +---------+---------------+---------+-----------+----------+--------------+ RIGHT    CompressibilityPhasicitySpontaneityPropertiesThrombus Aging  +---------+---------------+---------+-----------+----------+--------------+ CFV      Full           Yes      Yes                                 +---------+---------------+---------+-----------+----------+--------------+ SFJ      None           No       No                   Acute          +---------+---------------+---------+-----------+----------+--------------+ FV Prox  Full                                                        +---------+---------------+---------+-----------+----------+--------------+  FV Mid   Full                                                        +---------+---------------+---------+-----------+----------+--------------+ FV DistalFull                                                        +---------+---------------+---------+-----------+----------+--------------+ PFV      Full                                                        +---------+---------------+---------+-----------+----------+--------------+ POP      Full           Yes      Yes                                 +---------+---------------+---------+-----------+----------+--------------+ PTV      Full                                                        +---------+---------------+---------+-----------+----------+--------------+ PERO     Full                                                        +---------+---------------+---------+-----------+----------+--------------+ GSV      None           No       No                   Acute          +---------+---------------+---------+-----------+----------+--------------+   +---------+---------------+---------+-----------+----------+--------------+ LEFT     CompressibilityPhasicitySpontaneityPropertiesThrombus Aging +---------+---------------+---------+-----------+----------+--------------+ CFV      Full           Yes      Yes                                  +---------+---------------+---------+-----------+----------+--------------+ SFJ      None           No       No                   Acute          +---------+---------------+---------+-----------+----------+--------------+ FV Prox  Full                                                        +---------+---------------+---------+-----------+----------+--------------+  FV Mid   Full                                                        +---------+---------------+---------+-----------+----------+--------------+ FV DistalFull                                                        +---------+---------------+---------+-----------+----------+--------------+ PFV      Full                                                        +---------+---------------+---------+-----------+----------+--------------+ POP      Full           Yes      Yes                                 +---------+---------------+---------+-----------+----------+--------------+ PTV      Full                                                        +---------+---------------+---------+-----------+----------+--------------+ PERO     Full                                                        +---------+---------------+---------+-----------+----------+--------------+ GSV      None           No       No                   Acute          +---------+---------------+---------+-----------+----------+--------------+     Summary: RIGHT: - No DVT Right lower extremity. - Findings consistent with acute superficial vein thrombosis involving the right great saphenous vein and SFJ. - No cystic structure found in the popliteal fossa.  LEFT: - No DVT of Left lower extremity. - Findings consistent with acute superficial vein thrombosis involving the left great saphenous vein and SFJ. - No cystic structure found in the popliteal fossa.  *See table(s) above for measurements and observations. Electronically signed by  Deitra Mayo MD on 12/29/2019 at 6:13:44 PM.    Final    US Abdomen Limited RUQ (LIVER/GB)  Result Date: 12/29/2019 CLINICAL DATA:  49 year old female with elevated LFTs. History of lung cancer. EXAM: ULTRASOUND ABDOMEN LIMITED RIGHT UPPER QUADRANT COMPARISON:  CT abdomen pelvis dated 12/29/2019. FINDINGS: Gallbladder: No gallstone. There is diffuse thickened appearance of the gallbladder wall measuring 5 mm in thickness, likely related to ascites. Negative sonographic Murphy's sign. Common bile duct: Diameter: 18 mm. The common bile duct is dilated. No echogenic stone noted within the visualized CBD. Liver: There is  coarsened appearance of the liver, likely early changes of cirrhosis. Clinical correlation is recommended. The liver demonstrates a normal echogenicity. Portal vein is patent on color Doppler imaging with normal direction of blood flow towards the liver. Other: Small ascites and partially visualized small right pleural effusion. IMPRESSION: 1. Dilated CBD. No stone identified within the gallbladder or visualized CBD. 2. Coarsened liver.  Correlation with LFTs recommended. 3. Small ascites and partially visualized right pleural effusion. Electronically Signed   By: Anner Crete M.D.   On: 12/29/2019 19:42    (Echo, Carotid, EGD, Colonoscopy, ERCP)    Subjective: Patient resting in bed husband by the bedside  Discharge Exam: Vitals:   01/03/20 0559 01/03/20 1011  BP: 108/65 111/67  Pulse: (!) 131 (!) 118  Resp: 14 18  Temp: 98.4 F (36.9 C) 97.6 F (36.4 C)  SpO2: 95% 96%   Vitals:   01/02/20 1748 01/02/20 2021 01/03/20 0559 01/03/20 1011  BP: 106/63 (!) 107/41 108/65 111/67  Pulse: (!) 133 (!) 132 (!) 131 (!) 118  Resp: 16 14 14 18   Temp: 98.5 F (36.9 C) 98.4 F (36.9 C) 98.4 F (36.9 C) 97.6 F (36.4 C)  TempSrc: Oral Oral Oral Oral  SpO2: 96% 98% 95% 96%  Weight:   43.1 kg   Height:        General: Pt is alert, awake, not in acute  distress Cardiovascular: RRR, S1/S2 +, no rubs, no gallops Respiratory: CTA bilaterally, no wheezing, no rhonchi Abdominal: Soft, NT, ND, bowel sounds + Extremities: no edema, no cyanosis    The results of significant diagnostics from this hospitalization (including imaging, microbiology, ancillary and laboratory) are listed below for reference.     Microbiology: Recent Results (from the past 240 hour(s))  Resp Panel by RT-PCR (Flu A&B, Covid) Nasopharyngeal Swab     Status: None   Collection Time: 12/29/19  5:13 PM   Specimen: Nasopharyngeal Swab; Nasopharyngeal(NP) swabs in vial transport medium  Result Value Ref Range Status   SARS Coronavirus 2 by RT PCR NEGATIVE NEGATIVE Final    Comment: (NOTE) SARS-CoV-2 target nucleic acids are NOT DETECTED.  The SARS-CoV-2 RNA is generally detectable in upper respiratory specimens during the acute phase of infection. The lowest concentration of SARS-CoV-2 viral copies this assay can detect is 138 copies/mL. A negative result does not preclude SARS-Cov-2 infection and should not be used as the sole basis for treatment or other patient management decisions. A negative result may occur with  improper specimen collection/handling, submission of specimen other than nasopharyngeal swab, presence of viral mutation(s) within the areas targeted by this assay, and inadequate number of viral copies(<138 copies/mL). A negative result must be combined with clinical observations, patient history, and epidemiological information. The expected result is Negative.  Fact Sheet for Patients:  EntrepreneurPulse.com.au  Fact Sheet for Healthcare Providers:  IncredibleEmployment.be  This test is no t yet approved or cleared by the [place] FDA and  has been authorized for detection and/or diagnosis of SARS-CoV-2 by FDA under an Emergency Use Authorization (EUA). This EUA will remain  in effect (meaning this test  can be used) for the duration of the COVID-19 declaration under Section 564(b)(1) of the Act, 21 U.S.C.section 360bbb-3(b)(1), unless the authorization is terminated  or revoked sooner.       Influenza A by PCR NEGATIVE NEGATIVE Final   Influenza B by PCR NEGATIVE NEGATIVE Final    Comment: (NOTE) The Xpert Xpress SARS-CoV-2/FLU/RSV plus assay is intended as an  aid in the diagnosis of influenza from Nasopharyngeal swab specimens and should not be used as a sole basis for treatment. Nasal washings and aspirates are unacceptable for Xpert Xpress SARS-CoV-2/FLU/RSV testing.  Fact Sheet for Patients: EntrepreneurPulse.com.au  Fact Sheet for Healthcare Providers: IncredibleEmployment.be  This test is not yet approved or cleared by the [place] FDA and has been authorized for detection and/or diagnosis of SARS-CoV-2 by FDA under an Emergency Use Authorization (EUA). This EUA will remain in effect (meaning this test can be used) for the duration of the COVID-19 declaration under Section 564(b)(1) of the Act, 21 U.S.C. section 360bbb-3(b)(1), unless the authorization is terminated or revoked.  Performed at [facility], [place] [address] [place], [place] [address]   Culture, Urine     Status: Abnormal   Collection Time: 12/29/19  6:42 PM   Specimen: Urine, Clean Catch  Result Value Ref Range Status   Specimen Description   Final    URINE, CLEAN CATCH Performed at [facility], [place] [address], [place], [place] [address]    Special Requests   Final    NONE Performed at [facility], [place] [address] [place], [place] [address]    Culture 30,000 COLONIES/mL STAPHYLOCOCCUS AUREUS (A)  Final   Report Status 12/31/2019 FINAL  Final   Organism ID, Bacteria STAPHYLOCOCCUS AUREUS (A)  Final      Susceptibility   Staphylococcus aureus - MIC*    CIPROFLOXACIN <=0.5 SENSITIVE Sensitive      GENTAMICIN <=0.5 SENSITIVE Sensitive     NITROFURANTOIN 32 SENSITIVE Sensitive     OXACILLIN <=0.25 SENSITIVE Sensitive     TETRACYCLINE <=1 SENSITIVE Sensitive     VANCOMYCIN 1 SENSITIVE Sensitive     TRIMETH/SULFA <=10 SENSITIVE Sensitive     CLINDAMYCIN <=0.25 SENSITIVE Sensitive     RIFAMPIN <=0.5 SENSITIVE Sensitive     Inducible Clindamycin NEGATIVE Sensitive     * 30,000 COLONIES/mL STAPHYLOCOCCUS AUREUS     Labs: BNP (last 3 results) No results for input(s): BNP in the last 8760 hours. Basic Metabolic Panel: Recent Labs  Lab 12/29/19 1035 12/30/19 0448 12/31/19 1526 01/01/20 0610 01/01/20 1655 01/02/20 0018 01/02/20 0832 01/03/20 0603  NA 120*   < > 125* 126* 126* 125* 129* 132*  K 2.3*   < > 3.7 3.8 3.8  --  4.2 3.4*  CL 71*   < > 89* 94* 95*  --  95* 98  CO2 36*   < > 26 22 25   --  25 24  GLUCOSE 116*   < > 104* 105* 112*  --  108* 113*  BUN 13   < > 10 7 8   --  8 7  CREATININE 0.38*   < > 0.75 0.39* 0.41*  --  0.63 0.45  CALCIUM 7.8*   < > 6.6* 6.3* 7.0*  --  7.7* 7.4*  MG 2.2  --   --   --   --   --   --   --   PHOS 2.7  --   --   --   --   --   --  2.6   < > = values in this interval not displayed.   Liver Function Tests: Recent Labs  Lab 12/29/19 1035 12/30/19 0448 01/01/20 1655 01/03/20 0603  AST 22 25 18   --   ALT 13 14 11   --   ALKPHOS 82 74 67  --   BILITOT 0.6 1.1  0.8  --   PROT 5.3* 4.7* 4.4*  --   ALBUMIN 2.4* 2.2* 2.0* 2.1*   Recent Labs  Lab 12/29/19 1035  LIPASE 17   Recent Labs  Lab 12/30/19 0032  AMMONIA 21   CBC: Recent Labs  Lab 12/30/19 0448 12/31/19 0549 01/01/20 0610 01/02/20 0832 01/03/20 0603  WBC 7.4 7.6 7.8 8.8 7.5  HGB 9.0* 9.4* 8.6* 9.5* 8.2*  HCT 26.2* 28.8* 26.0* 28.0* 25.1*  MCV 86.2 88.6 88.4 88.1 89.0  PLT 64* 80* 87* 92* 95*   Cardiac Enzymes: No results for input(s): CKTOTAL, CKMB, CKMBINDEX, TROPONINI in the last 168 hours. BNP: Invalid input(s): POCBNP CBG: Recent Labs  Lab 12/29/19 1011   GLUCAP 116*   D-Dimer No results for input(s): DDIMER in the last 72 hours. Hgb A1c No results for input(s): HGBA1C in the last 72 hours. Lipid Profile No results for input(s): CHOL, HDL, LDLCALC, TRIG, CHOLHDL, LDLDIRECT in the last 72 hours. Thyroid function studies No results for input(s): TSH, T4TOTAL, T3FREE, THYROIDAB in the last 72 hours.  Invalid input(s): FREET3 Anemia work up No results for input(s): VITAMINB12, FOLATE, FERRITIN, TIBC, IRON, RETICCTPCT in the last 72 hours. Urinalysis    Component Value Date/Time   COLORURINE YELLOW 12/29/2019 1842   APPEARANCEUR CLEAR 12/29/2019 1842   LABSPEC >1.046 (H) 12/29/2019 1842   LABSPEC 1.005 09/26/2015 0920   PHURINE 7.0 12/29/2019 1842   GLUCOSEU NEGATIVE 12/29/2019 1842   GLUCOSEU Negative 09/26/2015 0920   HGBUR SMALL (A) 12/29/2019 1842   BILIRUBINUR NEGATIVE 12/29/2019 1842   BILIRUBINUR Negative 09/26/2015 0920   KETONESUR 20 (A) 12/29/2019 1842   PROTEINUR NEGATIVE 12/29/2019 1842   UROBILINOGEN 0.2 09/26/2015 0920   NITRITE NEGATIVE 12/29/2019 1842   LEUKOCYTESUR NEGATIVE 12/29/2019 1842   LEUKOCYTESUR Negative 09/26/2015 0920   Sepsis Labs Invalid input(s): PROCALCITONIN,  WBC,  LACTICIDVEN Microbiology Recent Results (from the past 240 hour(s))  Resp Panel by RT-PCR (Flu A&B, Covid) Nasopharyngeal Swab     Status: None   Collection Time: 12/29/19  5:13 PM   Specimen: Nasopharyngeal Swab; Nasopharyngeal(NP) swabs in vial transport medium  Result Value Ref Range Status   SARS Coronavirus 2 by RT PCR NEGATIVE NEGATIVE Final    Comment: (NOTE) SARS-CoV-2 target nucleic acids are NOT DETECTED.  The SARS-CoV-2 RNA is generally detectable in upper respiratory specimens during the acute phase of infection. The lowest concentration of SARS-CoV-2 viral copies this assay can detect is 138 copies/mL. A negative result does not preclude SARS-Cov-2 infection and should not be used as the sole basis for treatment  or other patient management decisions. A negative result may occur with  improper specimen collection/handling, submission of specimen other than nasopharyngeal swab, presence of viral mutation(s) within the areas targeted by this assay, and inadequate number of viral copies(<138 copies/mL). A negative result must be combined with clinical observations, patient history, and epidemiological information. The expected result is Negative.  Fact Sheet for Patients:  EntrepreneurPulse.com.au  Fact Sheet for Healthcare Providers:  IncredibleEmployment.be  This test is no t yet approved or cleared by the [place] FDA and  has been authorized for detection and/or diagnosis of SARS-CoV-2 by FDA under an Emergency Use Authorization (EUA). This EUA will remain  in effect (meaning this test can be used) for the duration of the COVID-19 declaration under Section 564(b)(1) of the Act, 21 U.S.C.section 360bbb-3(b)(1), unless the authorization is terminated  or revoked sooner.       Influenza A by PCR  NEGATIVE NEGATIVE Final   Influenza B by PCR NEGATIVE NEGATIVE Final    Comment: (NOTE) The Xpert Xpress SARS-CoV-2/FLU/RSV plus assay is intended as an aid in the diagnosis of influenza from Nasopharyngeal swab specimens and should not be used as a sole basis for treatment. Nasal washings and aspirates are unacceptable for Xpert Xpress SARS-CoV-2/FLU/RSV testing.  Fact Sheet for Patients: EntrepreneurPulse.com.au  Fact Sheet for Healthcare Providers: IncredibleEmployment.be  This test is not yet approved or cleared by the [place] FDA and has been authorized for detection and/or diagnosis of SARS-CoV-2 by FDA under an Emergency Use Authorization (EUA). This EUA will remain in effect (meaning this test can be used) for the duration of the COVID-19 declaration under Section 564(b)(1) of the Act, 21 U.S.C. section  360bbb-3(b)(1), unless the authorization is terminated or revoked.  Performed at [facility], [place] [address], [place], [place] [address]   Culture, Urine     Status: Abnormal   Collection Time: 12/29/19  6:42 PM   Specimen: Urine, Clean Catch  Result Value Ref Range Status   Specimen Description   Final    URINE, CLEAN CATCH Performed at [facility], [place] [address], [place], [place] [address]    Special Requests   Final    NONE Performed at [facility], [place] [address] [place], [place] [address]    Culture 30,000 COLONIES/mL STAPHYLOCOCCUS AUREUS (A)  Final   Report Status 12/31/2019 FINAL  Final   Organism ID, Bacteria STAPHYLOCOCCUS AUREUS (A)  Final      Susceptibility   Staphylococcus aureus - MIC*    CIPROFLOXACIN <=0.5 SENSITIVE Sensitive     GENTAMICIN <=0.5 SENSITIVE Sensitive     NITROFURANTOIN 32 SENSITIVE Sensitive     OXACILLIN <=0.25 SENSITIVE Sensitive     TETRACYCLINE <=1 SENSITIVE Sensitive     VANCOMYCIN 1 SENSITIVE Sensitive     TRIMETH/SULFA <=10 SENSITIVE Sensitive     CLINDAMYCIN <=0.25 SENSITIVE Sensitive     RIFAMPIN <=0.5 SENSITIVE Sensitive     Inducible Clindamycin NEGATIVE Sensitive     * 30,000 COLONIES/mL STAPHYLOCOCCUS AUREUS     Time coordinating discharge:  39 minutes  SIGNED:   Georgette Shell, MD  [facility] 01/03/2020, 12:04 PM

## 2020-01-03 NOTE — Progress Notes (Signed)
   01/03/20 2112  Assess: MEWS Score  Temp (!) 97.5 F (36.4 C)  BP (!) 105/54  Pulse Rate (!) 130  Resp 17  SpO2 95 %  O2 Device Room Air  Assess: MEWS Score  MEWS Temp 0  MEWS Systolic 0  MEWS Pulse 3  MEWS RR 0  MEWS LOC 0  MEWS Score 3  MEWS Score Color Yellow  Assess: if the MEWS score is Yellow or Red  Were vital signs taken at a resting state? Yes  Focused Assessment No change from prior assessment  Early Detection of Sepsis Score *See Row Information* Low  MEWS guidelines implemented *See Row Information* No, previously yellow, continue vital signs every 4 hours  Take Vital Signs  Increase Vital Sign Frequency  Yellow: Q 2hr X 2 then Q 4hr X 2, if remains yellow, continue Q 4hrs  Escalate  MEWS: Escalate Yellow: discuss with charge nurse/RN and consider discussing with provider and RRT  Notify: Charge Nurse/RN  Name of Charge Nurse/RN Notified Hortencia Conradi, 2nd RN  Date Charge Nurse/RN Notified 01/03/20  Time Charge Nurse/RN Notified 1900  Notify: Provider  Provider Name/Title n/a  Notify: Rapid Response  Name of Rapid Response RN Notified n/a  Document  Patient Outcome Stabilized after interventions  Progress note created (see row info) Yes

## 2020-01-04 ENCOUNTER — Ambulatory Visit (HOSPITAL_COMMUNITY)
Admission: RE | Admit: 2020-01-04 | Discharge: 2020-01-04 | Disposition: A | Discharge status: Home / Self Care | Payer: Medicare Other | Source: Ambulatory Visit | Attending: Internal Medicine | Admitting: Internal Medicine

## 2020-01-04 ENCOUNTER — Ambulatory Visit (HOSPITAL_COMMUNITY): Payer: Medicare Other

## 2020-01-04 LAB — CBC
HCT: 24.3 % — ABNORMAL LOW (ref 36.0–46.0)
Hemoglobin: 8 g/dL — ABNORMAL LOW (ref 12.0–15.0)
MCH: 29.5 pg (ref 26.0–34.0)
MCHC: 32.9 g/dL (ref 30.0–36.0)
MCV: 89.7 fL (ref 80.0–100.0)
Platelets: 73 10*3/uL — ABNORMAL LOW (ref 150–400)
RBC: 2.71 MIL/uL — ABNORMAL LOW (ref 3.87–5.11)
RDW: 16.4 % — ABNORMAL HIGH (ref 11.5–15.5)
WBC: 7.2 10*3/uL (ref 4.0–10.5)
nRBC: 0 % (ref 0.0–0.2)

## 2020-01-04 MED ORDER — SODIUM CHLORIDE 0.9 % IV SOLN: INTRAVENOUS | Status: DC

## 2020-01-04 MED FILL — PROCTOZONE-HC 2.5 % CREA: 2.5 | 10 days supply | Qty: 30 | Fill #0

## 2020-01-04 NOTE — Care Management Important Message (Signed)
Important Message  Patient Details IM Letter given to the Patient. Name: Deborah Foster MRN: [id number] Date of Birth: [birth year]   Medicare Important Message Given:  Yes     [place], Shew 01/04/2020, 11:14 AM

## 2020-01-04 NOTE — Progress Notes (Signed)
Patient is hospitalized. Unable to use OP order while patient is an Inpatient. Please Reschedule patient for MRI when she has been discharged.

## 2020-01-05 ENCOUNTER — Inpatient Hospital Stay: Payer: Medicare Other

## 2020-01-05 ENCOUNTER — Inpatient Hospital Stay: Payer: Medicare Other | Admitting: Internal Medicine

## 2020-01-05 ENCOUNTER — Ambulatory Visit: Payer: Medicare Other | Admitting: Radiation Oncology

## 2020-01-06 MED FILL — TAGRISSO 80 MG TABLET: 80 | 30 days supply | Qty: 30 | Fill #9

## 2020-01-10 ENCOUNTER — Emergency Department (HOSPITAL_COMMUNITY): Payer: Medicare Other

## 2020-01-10 ENCOUNTER — Inpatient Hospital Stay (HOSPITAL_COMMUNITY)
Admission: EM | Admit: 2020-01-10 | Discharge: 2020-01-16 | DRG: 871 | Disposition: E | Payer: Medicare Other | Attending: Internal Medicine | Admitting: Internal Medicine

## 2020-01-10 ENCOUNTER — Encounter (HOSPITAL_COMMUNITY): Payer: Self-pay | Admitting: Emergency Medicine

## 2020-01-10 DIAGNOSIS — R64 Cachexia: Secondary | ICD-10-CM | POA: Diagnosis present

## 2020-01-10 DIAGNOSIS — C50919 Malignant neoplasm of unspecified site of unspecified female breast: Secondary | ICD-10-CM

## 2020-01-10 DIAGNOSIS — R6521 Severe sepsis with septic shock: Secondary | ICD-10-CM | POA: Diagnosis present

## 2020-01-10 DIAGNOSIS — R4182 Altered mental status, unspecified: Secondary | ICD-10-CM | POA: Clinically undetermined

## 2020-01-10 DIAGNOSIS — Z7189 Other specified counseling: Secondary | ICD-10-CM | POA: Diagnosis not present

## 2020-01-10 DIAGNOSIS — D638 Anemia in other chronic diseases classified elsewhere: Secondary | ICD-10-CM | POA: Diagnosis present

## 2020-01-10 DIAGNOSIS — Z923 Personal history of irradiation: Secondary | ICD-10-CM

## 2020-01-10 DIAGNOSIS — K219 Gastro-esophageal reflux disease without esophagitis: Secondary | ICD-10-CM | POA: Diagnosis present

## 2020-01-10 DIAGNOSIS — Z9221 Personal history of antineoplastic chemotherapy: Secondary | ICD-10-CM

## 2020-01-10 DIAGNOSIS — E440 Moderate protein-calorie malnutrition: Secondary | ICD-10-CM | POA: Diagnosis present

## 2020-01-10 DIAGNOSIS — Z888 Allergy status to other drugs, medicaments and biological substances status: Secondary | ICD-10-CM

## 2020-01-10 DIAGNOSIS — A4189 Other specified sepsis: Secondary | ICD-10-CM | POA: Diagnosis not present

## 2020-01-10 DIAGNOSIS — A419 Sepsis, unspecified organism: Secondary | ICD-10-CM

## 2020-01-10 DIAGNOSIS — C3431 Malignant neoplasm of lower lobe, right bronchus or lung: Secondary | ICD-10-CM | POA: Diagnosis present

## 2020-01-10 DIAGNOSIS — Z66 Do not resuscitate: Secondary | ICD-10-CM | POA: Diagnosis present

## 2020-01-10 DIAGNOSIS — D696 Thrombocytopenia, unspecified: Secondary | ICD-10-CM | POA: Diagnosis present

## 2020-01-10 DIAGNOSIS — Z681 Body mass index (BMI) 19 or less, adult: Secondary | ICD-10-CM

## 2020-01-10 DIAGNOSIS — U071 COVID-19: Secondary | ICD-10-CM | POA: Diagnosis present

## 2020-01-10 DIAGNOSIS — Z83438 Family history of other disorder of lipoprotein metabolism and other lipidemia: Secondary | ICD-10-CM

## 2020-01-10 DIAGNOSIS — E876 Hypokalemia: Secondary | ICD-10-CM | POA: Diagnosis present

## 2020-01-10 DIAGNOSIS — G40909 Epilepsy, unspecified, not intractable, without status epilepticus: Secondary | ICD-10-CM | POA: Diagnosis present

## 2020-01-10 DIAGNOSIS — Z881 Allergy status to other antibiotic agents status: Secondary | ICD-10-CM

## 2020-01-10 DIAGNOSIS — C7931 Secondary malignant neoplasm of brain: Secondary | ICD-10-CM | POA: Diagnosis present

## 2020-01-10 DIAGNOSIS — C7951 Secondary malignant neoplasm of bone: Secondary | ICD-10-CM | POA: Diagnosis present

## 2020-01-10 DIAGNOSIS — Z7901 Long term (current) use of anticoagulants: Secondary | ICD-10-CM

## 2020-01-10 DIAGNOSIS — Z515 Encounter for palliative care: Secondary | ICD-10-CM

## 2020-01-10 DIAGNOSIS — Z885 Allergy status to narcotic agent status: Secondary | ICD-10-CM

## 2020-01-10 DIAGNOSIS — Z79899 Other long term (current) drug therapy: Secondary | ICD-10-CM

## 2020-01-10 DIAGNOSIS — J9601 Acute respiratory failure with hypoxia: Secondary | ICD-10-CM | POA: Diagnosis present

## 2020-01-10 DIAGNOSIS — C7972 Secondary malignant neoplasm of left adrenal gland: Secondary | ICD-10-CM | POA: Diagnosis present

## 2020-01-10 MED ORDER — SODIUM CHLORIDE 0.9 % IV BOLUS (SEPSIS)
1000.0000 mL | Freq: Once | INTRAVENOUS | Status: AC
  Administered 2020-01-10: 1000 mL via INTRAVENOUS

## 2020-01-10 MED ORDER — SODIUM CHLORIDE 0.9 % IV SOLN
2.0000 g | Freq: Once | INTRAVENOUS | Status: AC
  Administered 2020-01-11: 2 g via INTRAVENOUS
  Filled 2020-01-10: qty 2

## 2020-01-10 MED ORDER — SODIUM CHLORIDE 0.9 % IV SOLN: INTRAVENOUS | Status: DC

## 2020-01-10 MED ORDER — VANCOMYCIN HCL IN DEXTROSE 1-5 GM/200ML-% IV SOLN
1000.0000 mg | Freq: Once | INTRAVENOUS | Status: AC
  Administered 2020-01-11: 01:00:00 1000 mg via INTRAVENOUS
  Filled 2020-01-10: qty 200

## 2020-01-10 MED ORDER — METRONIDAZOLE IN NACL 5-0.79 MG/ML-% IV SOLN
500.0000 mg | Freq: Once | INTRAVENOUS | Status: AC
  Administered 2020-01-10: 500 mg via INTRAVENOUS
  Filled 2020-01-10: qty 100

## 2020-01-10 NOTE — ED Provider Notes (Addendum)
[facility] DEPT Provider Note   CSN: [id number] Arrival date & time: 01/09/2020  2312     History Chief Complaint  Patient presents with  . Code Sepsis    Deborah Foster is a 49 y.o. female.  HPI Level 5 caveat due to altered mental status.  Patient is a 49 year old female with a complicated medical history.  Presents today via EMS under code sepsis.  Family found patient in her bedroom altered not clearly answering questions.  Appears extremely weak.  She was found to be hypoxic, febrile, as well as hypotensive upon arrival by EMS.  Started on a nonrebreather.  Oxygen saturations in the mid to high 90s.  Tachypneic.  Patient appears to have diffuse abdominal pain my exam as well as 1+ pitting edema.    Past Medical History:  Diagnosis Date  . Bone cancer ([place])   . Bone metastasis ([place])   . Hemoptysis   . Hypokalemia   . lung ca dx'd 07/2014  . Lung mass   . Metastasis to adrenal gland ([place])   . Metastasis to brain ([place])   . Pneumonia   . Radiation 08/23/14-09/07/14   Brain/chest and left hip 30 Gy 12 Fx  . URI (upper respiratory infection) 02-02-2015    Patient Active Problem List   Diagnosis Date Noted  . Elevated LFTs   . Dehydration   . Sinus tachycardia   . Constipation   . Hypokalemia   . Hypophosphatemia   . Hypocalcemia   . Metabolic acidosis   . Seizure disorder ([place])   . Hyponatremia 11/21/2019  . Nausea and vomiting 11/21/2019  . Back pain 11/21/2019  . Lung metastasis ([place]) 04/30/2019  . Goals of care, counseling/discussion 05/05/2018  . Epigastric pain 05/05/2018  . Blurry vision, bilateral 05/05/2018  . 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 Metastasis to supraclavicular lymph node ([place]) 04/08/2018  . Difficulty sleeping 03/20/2018  . Primary cancer of right lower lobe of lung ([place]) 07/22/2015  . Malnutrition of moderate degree 07/21/2015  . Hemoptysis   . Primary lung adenocarcinoma ([place])   .  Odynophagia 07/05/2015  . Folic acid deficiency 94/70/9628  . B12 deficiency 06/06/2015  . Radiation pneumonitis with hemoptysis 06/06/2015  . Bone metastases ([place]) 01/20/2015  . Malignant neoplasm metastatic to left adrenal gland ([place]) 01/20/2015  . Bronchitis 11/22/2014  . Encounter for antineoplastic chemotherapy 11/02/2014  . Mucositis 10/22/2014  . Rash 10/08/2014  . Non-small cell carcinoma of lung, stage 4 ([place]) 08/26/2014  . Bone disease, metabolic 36/62/9476  . Metastasis to brain ([place])   . Palliative care encounter   . Adrenal mass, left ([place])   . CAP (community acquired pneumonia) 08/07/2014  . Pneumonia 08/07/2014  . Lung mass 08/07/2014    Past Surgical History:  Procedure Laterality Date  . IR GENERIC HISTORICAL  07/14/2015   IR RADIOLOGIST EVAL & MGMT 07/14/2015 Greggory Keen, MD [facility] INTERV RAD  . VIDEO BRONCHOSCOPY Bilateral 08/09/2014   Procedure: VIDEO BRONCHOSCOPY WITHOUT FLUORO;  Surgeon: [name], MD;  Location: [facility] ENDOSCOPY;  Service: Cardiopulmonary;  Laterality: Bilateral;  . VIDEO BRONCHOSCOPY Bilateral 07/06/2015   Procedure: VIDEO BRONCHOSCOPY WITHOUT FLUORO;  Surgeon: [name], MD;  Location: [facility] ENDOSCOPY;  Service: Cardiopulmonary;  Laterality: Bilateral;     OB History   No obstetric history on file.     Family History  Problem Relation Age of Onset  . Hyperlipidemia Mother     Social History  Tobacco Use  . Smoking status: Never Smoker  . Smokeless tobacco: Never Used  Vaping Use  . Vaping Use: Never used  Substance Use Topics  . Alcohol use: No    Alcohol/week: 0.0 standard drinks  . Drug use: No    Home Medications Prior to Admission medications   Medication Sig Start Date End Date Taking? Authorizing Provider  calcium-vitamin D (OSCAL WITH D) 500-200 MG-UNIT tablet Take 1 tablet by mouth 3 (three) times daily. 11/27/19   Eugenie Filler, MD  Cyanocobalamin (VITAMIN B-12) 2500 MCG SUBL Place 2,500 mcg under  the tongue daily.    [provider]  diclofenac Sodium (VOLTAREN) 1 % GEL Apply 2 g topically 4 (four) times daily. 11/27/19   Eugenie Filler, MD  ferrous sulfate 325 (65 FE) MG tablet Take 325 mg by mouth daily with breakfast.    [provider]  folic acid (FOLVITE) 540 MCG tablet Take 800 mcg by mouth daily.    [provider]  furosemide (LASIX) 80 MG tablet Take 40-80 mg by mouth daily as needed for fluid. 12/14/19   [provider]  gabapentin (NEURONTIN) 300 MG capsule Take 600 mg by mouth 2 (two) times daily. 12/16/19   [provider]  hydrocortisone (ANUSOL-HC) 2.5 % rectal cream Place rectally every 4 (four) hours as needed for hemorrhoids or anal itching. 01/03/20   Georgette Shell, MD  HYDROmorphone (DILAUDID) 4 MG tablet Take 1 tablet (4 mg total) by mouth every 6 (six) hours as needed for severe pain. 11/27/19   Acquanetta Chain, DO  lactulose (CHRONULAC) 10 GM/15ML solution Take 30 mLs (20 g total) by mouth 2 (two) times daily as needed for moderate constipation. 11/30/19   Tanner, Lyndon Code., PA-C  levETIRAcetam (KEPPRA) 250 MG tablet Take 1 tablet (250 mg total) by mouth 2 (two) times daily. 07/02/19   Ventura Sellers, MD  loratadine (CLARITIN) 10 MG tablet Take 1 tablet (10 mg total) by mouth daily. Patient taking differently: Take 10 mg by mouth daily as needed for allergies. 04/30/16   [name], MD  morphine (MS CONTIN) 30 MG 12 hr tablet Take 1 tablet (30 mg total) by mouth every 12 (twelve) hours. 12/08/19   [name], MD  Multiple Vitamin (MULTIVITAMIN WITH MINERALS) TABS tablet Take 1 tablet by mouth daily. 01/04/20   Georgette Shell, MD  ondansetron (ZOFRAN) 8 MG tablet Take 8 mg by mouth every 8 (eight) hours as needed for nausea or vomiting.  06/30/19   [provider]  polyethylene glycol (MIRALAX / GLYCOLAX) 17 g packet Take 17 g by mouth 2 (two) times daily. 11/27/19   Eugenie Filler,  MD  senna-docusate (SENOKOT-S) 8.6-50 MG tablet Take 1 tablet by mouth 2 (two) times daily. 11/27/19   Eugenie Filler, MD  TAGRISSO 80 MG tablet TAKE 1 TABLET (80 MG TOTAL) BY MOUTH DAILY. Patient taking differently: Take 80 mg by mouth daily. 04/28/19   [name], MD  XARELTO 20 MG TABS tablet Take 20 mg by mouth every evening. 12/03/19   [provider]    Allergies    Gabapentin, Other, Tramadol, and Doxycycline  Review of Systems   Review of Systems  Unable to perform ROS: Mental status change   Physical Exam Updated Vital Signs BP 116/69 (BP Location: Right Arm)   Pulse (!) 133   Resp 18   SpO2 95%   Physical Exam Vitals and nursing note reviewed.  Constitutional:  General: She is in acute distress.     Appearance: She is ill-appearing and toxic-appearing. She is not diaphoretic.     Comments: Extremely cachectic adult female.  Appears to be in pain.  Not clearly answering questions.  Lying supine.  HENT:     Head: Normocephalic and atraumatic.     Right Ear: External ear normal.     Left Ear: External ear normal.     Nose: Nose normal.     Mouth/Throat:     Pharynx: Oropharynx is clear.  Eyes:     General: No scleral icterus.       Right eye: No discharge.        Left eye: No discharge.     Conjunctiva/sclera: Conjunctivae normal.  Cardiovascular:     Rate and Rhythm: Regular rhythm. Tachycardia present.     Pulses: Normal pulses.     Heart sounds: Normal heart sounds. No murmur heard. No friction rub. No gallop.   Pulmonary:     Effort: Pulmonary effort is normal. No respiratory distress.     Breath sounds: No stridor. Rhonchi present. No wheezing or rales.     Comments: Diffuse rhonchi noted in bilateral anterior lung fields.  Saturations in the mid to high 90s on nonrebreather. Abdominal:     General: Abdomen is flat.     Palpations: Abdomen is soft.     Tenderness: There is abdominal tenderness.     Comments: Patient appears to be  in significant pain with any palpation of the abdomen.    Musculoskeletal:        General: Normal range of motion.  Skin:    General: Skin is warm and dry.  Neurological:     Comments: Appears to be in pain.  Not clearly answering questions.    ED Results / Procedures / Treatments   Labs (all labs ordered are listed, but only abnormal results are displayed) Labs Reviewed  RESP PANEL BY RT-PCR (FLU A&B, COVID) ARPGX2 - Abnormal; Notable for the following components:      Result Value   SARS Coronavirus 2 by RT PCR POSITIVE (*)    All other components within normal limits  COMPREHENSIVE METABOLIC PANEL - Abnormal; Notable for the following components:   Potassium 2.5 (*)    Glucose, Bld 105 (*)    BUN 26 (*)    Calcium 7.4 (*)    Total Protein 4.8 (*)    Albumin 1.9 (*)    All other components within normal limits  LACTIC ACID, PLASMA - Abnormal; Notable for the following components:   Lactic Acid, Venous 2.2 (*)    All other components within normal limits  CBC WITH DIFFERENTIAL/PLATELET - Abnormal; Notable for the following components:   WBC 11.5 (*)    RBC 2.68 (*)    Hemoglobin 7.9 (*)    HCT 24.1 (*)    RDW 17.6 (*)    Platelets 21 (*)    nRBC 2.5 (*)    Neutro Abs 9.4 (*)    Abs Immature Granulocytes 0.62 (*)    All other components within normal limits  PROTIME-INR - Abnormal; Notable for the following components:   Prothrombin Time 17.8 (*)    INR 1.5 (*)    All other components within normal limits  APTT - Abnormal; Notable for the following components:   aPTT 50 (*)    All other components within normal limits  CULTURE, BLOOD (ROUTINE X 2)  CULTURE, BLOOD (ROUTINE X 2)  URINE CULTURE  URINALYSIS, ROUTINE W REFLEX MICROSCOPIC  LACTIC ACID, PLASMA  HCG, QUANTITATIVE, PREGNANCY  MAGNESIUM  I-STAT BETA HCG BLOOD, ED ([facility], [facility], [facility] ONLY)   EKG None  Radiology No results found.  Procedures Procedures (including critical care time)  Medications Ordered in  ED Medications  0.9 %  sodium chloride infusion ( Intravenous New Bag/Given Jan 15, 2020 0151)  potassium chloride 10 mEq in 100 mL IVPB (0 mEq Intravenous Stopped 01-15-2020 0533)  HYDROmorphone (DILAUDID) injection 0.5 mg (0.5 mg Intravenous Given 15-Jan-2020 0430)  LORazepam (ATIVAN) tablet 1 mg (has no administration in time range)    Or  LORazepam (ATIVAN) 2 MG/ML concentrated solution 1 mg (has no administration in time range)    Or  LORazepam (ATIVAN) injection 1 mg (has no administration in time range)  diphenhydrAMINE (BENADRYL) injection 12.5 mg (has no administration in time range)  ondansetron (ZOFRAN-ODT) disintegrating tablet 4 mg ( Oral See Alternative 01/15/2020 0430)    Or  ondansetron (ZOFRAN) injection 4 mg (4 mg Intravenous Given 15-Jan-2020 0430)  sodium chloride 0.9 % bolus 1,000 mL (0 mLs Intravenous Stopped 01/15/2020 0151)  ceFEPIme (MAXIPIME) 2 g in sodium chloride 0.9 % 100 mL IVPB (0 g Intravenous Stopped January 15, 2020 0035)  metroNIDAZOLE (FLAGYL) IVPB 500 mg (0 mg Intravenous Stopped 15-Jan-2020 0126)  vancomycin (VANCOCIN) IVPB 1000 mg/200 mL premix (0 mg Intravenous Stopped Jan 15, 2020 0146)  magnesium sulfate IVPB 2 g 50 mL (0 g Intravenous Stopped 01-15-20 0220)    ED Course  I have reviewed the triage vital signs and the nursing notes.  Pertinent labs & imaging results that were available during my care of the patient were reviewed by me and considered in my medical decision making (see chart for details).  Clinical Course as of 01/15/20 8938  Sun Jan 10, 2020  2345 Temp: 99.5 F (37.5 C) Rectal temp [LJ]  01-15-2020  0031 Potassium(!!): 2.5 [LJ]  0103 Calcium(!): 7.4 [LJ]  0107 Lactic Acid, Venous(!!): 2.2 [LJ]  0123 SARS Coronavirus 2 by RT PCR(!): POSITIVE [LJ]    Clinical Course User Index [LJ] Rayna Sexton, PA-C   MDM Rules/Calculators/A&P                          Patient is a 49 year old female with a complicated medical history.  Has a history of  stage IV metastatic breast cancer.  She came today in septic shock.  Patient was given IV bolus as well as broad-spectrum antibiotics.  Patient was found to be Covid positive today.  Currently on a nonrebreather saturating at 100%.  Extremely ill-appearing.  Elevated lactic acid at 2.2.  Decreased potassium at 2.5.  She was given IV potassium as well as magnesium.  Severe thrombocytopenia 21.  Patient discussed with and evaluated by my attending physician Dr. Merrily Pew.  This was a shared visit.  My attending physician discussed end-of-life goals with the patient and her relative at bedside.  Patient was found to be positive for COVID-19 today.  Given her medical history and current state of health, it is unlikely that patient will survive her COVID-19 diagnosis.  This was discussed with the patient at length.  She notes that she is "ready to die".  She requests to be DNR/DNI.  Remaining imaging and testing was discontinued.  She was put on PRN morphine and Ativan.    Patient initially admitted to medicine team for comfort care.  Patient reassessed and requested to be  removed from any oxygen therapy.  This was removed.  Patient comfortable in bed.  She has desaturated to the low 60s and been stable for the past few hours. Feel that it is reasonable to now admit patient for comfort care. Will re consult the medicine team.   Final Clinical Impression(s) / ED Diagnoses Final diagnoses:  Septic shock ([place])  DNR (do not resuscitate) discussion  Metastatic breast cancer [facility])   Rx / DC Orders ED Discharge Orders    None       Rayna Sexton, PA-C Jan 12, 2020 0328    Rayna Sexton, PA-C 2020-01-12 0715    Merrily Pew, MD 01/12/20 0111

## 2020-01-10 NOTE — Progress Notes (Signed)
A consult was received from an ED physician for Vancomycin and Cefepime per pharmacy dosing.  The patient's profile has been reviewed for ht/wt/allergies/indication/available labs.    A one time order has been placed for Vancomycin 1gm and Cefepime 2gm IV.    Further antibiotics/pharmacy consults should be ordered by admitting physician if indicated.                       Thank you, Everette Rank, PharmD 01/15/2020  11:37 PM

## 2020-01-10 NOTE — ED Triage Notes (Signed)
Pt to the ER after family found that she wasn't following commands. Last seen normal was 2030. Pt has a history of lung cancer and is normally able to take care of herself. Upon arrival of EmS pt was 50% on room air. BP 70/40, 130's HR, 102.9 temp and resp 30's.Code sepsis called ouyt in the field.

## 2020-01-10 NOTE — ED Notes (Signed)
ED Provider at bedside to evaluate pt.

## 2020-01-10 NOTE — ED Notes (Signed)
Initial contact with pt pt brought in via EMS.Pt to the ER after family found that she wasn't following commands. Last seen normal was 2030. Pt has a history of lung cancer and is normally able to take care of herself. Upon arrival of EmS pt was 50% on room air. BP 70/40, 130's HR, 102.9 temp and resp 30's.Code sepsis called out in the field. Pt has been placed on monitor x4, and is on 15L of O2 ( non-rebreather). Pt speaks vietnamese but has not been responsive to pt or family.

## 2020-01-11 ENCOUNTER — Other Ambulatory Visit: Payer: Self-pay

## 2020-01-11 DIAGNOSIS — D638 Anemia in other chronic diseases classified elsewhere: Secondary | ICD-10-CM | POA: Diagnosis present

## 2020-01-11 DIAGNOSIS — Z681 Body mass index (BMI) 19 or less, adult: Secondary | ICD-10-CM | POA: Diagnosis not present

## 2020-01-11 DIAGNOSIS — Z7189 Other specified counseling: Secondary | ICD-10-CM | POA: Diagnosis present

## 2020-01-11 DIAGNOSIS — A4189 Other specified sepsis: Secondary | ICD-10-CM | POA: Diagnosis present

## 2020-01-11 DIAGNOSIS — K219 Gastro-esophageal reflux disease without esophagitis: Secondary | ICD-10-CM | POA: Diagnosis present

## 2020-01-11 DIAGNOSIS — A419 Sepsis, unspecified organism: Secondary | ICD-10-CM | POA: Diagnosis present

## 2020-01-11 DIAGNOSIS — Z83438 Family history of other disorder of lipoprotein metabolism and other lipidemia: Secondary | ICD-10-CM | POA: Diagnosis not present

## 2020-01-11 DIAGNOSIS — C3431 Malignant neoplasm of lower lobe, right bronchus or lung: Secondary | ICD-10-CM | POA: Diagnosis present

## 2020-01-11 DIAGNOSIS — C7972 Secondary malignant neoplasm of left adrenal gland: Secondary | ICD-10-CM | POA: Diagnosis present

## 2020-01-11 DIAGNOSIS — Z515 Encounter for palliative care: Secondary | ICD-10-CM | POA: Diagnosis not present

## 2020-01-11 DIAGNOSIS — E876 Hypokalemia: Secondary | ICD-10-CM | POA: Diagnosis present

## 2020-01-11 DIAGNOSIS — D696 Thrombocytopenia, unspecified: Secondary | ICD-10-CM | POA: Diagnosis present

## 2020-01-11 DIAGNOSIS — J9601 Acute respiratory failure with hypoxia: Secondary | ICD-10-CM | POA: Diagnosis present

## 2020-01-11 DIAGNOSIS — R6521 Severe sepsis with septic shock: Secondary | ICD-10-CM | POA: Diagnosis present

## 2020-01-11 DIAGNOSIS — G40909 Epilepsy, unspecified, not intractable, without status epilepticus: Secondary | ICD-10-CM | POA: Diagnosis present

## 2020-01-11 DIAGNOSIS — C7931 Secondary malignant neoplasm of brain: Secondary | ICD-10-CM | POA: Diagnosis present

## 2020-01-11 DIAGNOSIS — U071 COVID-19: Secondary | ICD-10-CM | POA: Diagnosis present

## 2020-01-11 DIAGNOSIS — Z923 Personal history of irradiation: Secondary | ICD-10-CM | POA: Diagnosis not present

## 2020-01-11 DIAGNOSIS — E440 Moderate protein-calorie malnutrition: Secondary | ICD-10-CM | POA: Diagnosis present

## 2020-01-11 DIAGNOSIS — Z79899 Other long term (current) drug therapy: Secondary | ICD-10-CM | POA: Diagnosis not present

## 2020-01-11 DIAGNOSIS — Z66 Do not resuscitate: Secondary | ICD-10-CM

## 2020-01-11 DIAGNOSIS — C7951 Secondary malignant neoplasm of bone: Secondary | ICD-10-CM | POA: Diagnosis present

## 2020-01-11 DIAGNOSIS — R64 Cachexia: Secondary | ICD-10-CM | POA: Diagnosis present

## 2020-01-11 DIAGNOSIS — R4182 Altered mental status, unspecified: Secondary | ICD-10-CM | POA: Diagnosis not present

## 2020-01-11 DIAGNOSIS — Z7901 Long term (current) use of anticoagulants: Secondary | ICD-10-CM | POA: Diagnosis not present

## 2020-01-11 DIAGNOSIS — Z9221 Personal history of antineoplastic chemotherapy: Secondary | ICD-10-CM | POA: Diagnosis not present

## 2020-01-11 LAB — URINALYSIS, ROUTINE W REFLEX MICROSCOPIC
Bilirubin Urine: NEGATIVE
Glucose, UA: NEGATIVE mg/dL
Hgb urine dipstick: NEGATIVE
Ketones, ur: NEGATIVE mg/dL
Leukocytes,Ua: NEGATIVE
Nitrite: NEGATIVE
Protein, ur: NEGATIVE mg/dL
Specific Gravity, Urine: 1.014 (ref 1.005–1.030)
pH: 5 (ref 5.0–8.0)

## 2020-01-11 LAB — CBC WITH DIFFERENTIAL/PLATELET
Abs Immature Granulocytes: 0.62 10*3/uL — ABNORMAL HIGH (ref 0.00–0.07)
Basophils Absolute: 0 10*3/uL (ref 0.0–0.1)
Basophils Relative: 0 %
Eosinophils Absolute: 0.1 10*3/uL (ref 0.0–0.5)
Eosinophils Relative: 1 %
HCT: 24.1 % — ABNORMAL LOW (ref 36.0–46.0)
Hemoglobin: 7.9 g/dL — ABNORMAL LOW (ref 12.0–15.0)
Immature Granulocytes: 5 %
Lymphocytes Relative: 7 %
Lymphs Abs: 0.8 10*3/uL (ref 0.7–4.0)
MCH: 29.5 pg (ref 26.0–34.0)
MCHC: 32.8 g/dL (ref 30.0–36.0)
MCV: 89.9 fL (ref 80.0–100.0)
Monocytes Absolute: 0.6 10*3/uL (ref 0.1–1.0)
Monocytes Relative: 5 %
Neutro Abs: 9.4 10*3/uL — ABNORMAL HIGH (ref 1.7–7.7)
Neutrophils Relative %: 82 %
Platelets: 21 10*3/uL — CL (ref 150–400)
RBC: 2.68 MIL/uL — ABNORMAL LOW (ref 3.87–5.11)
RDW: 17.6 % — ABNORMAL HIGH (ref 11.5–15.5)
WBC: 11.5 10*3/uL — ABNORMAL HIGH (ref 4.0–10.5)
nRBC: 2.5 % — ABNORMAL HIGH (ref 0.0–0.2)

## 2020-01-11 LAB — COMPREHENSIVE METABOLIC PANEL
ALT: 13 U/L (ref 0–44)
AST: 29 U/L (ref 15–41)
Albumin: 1.9 g/dL — ABNORMAL LOW (ref 3.5–5.0)
Alkaline Phosphatase: 70 U/L (ref 38–126)
Anion gap: 11 (ref 5–15)
BUN: 26 mg/dL — ABNORMAL HIGH (ref 6–20)
CO2: 30 mmol/L (ref 22–32)
Calcium: 7.4 mg/dL — ABNORMAL LOW (ref 8.9–10.3)
Chloride: 98 mmol/L (ref 98–111)
Creatinine, Ser: 0.94 mg/dL (ref 0.44–1.00)
GFR, Estimated: >60 mL/min (ref >60)
Glucose, Bld: 105 mg/dL — ABNORMAL HIGH (ref 70–99)
Potassium: 2.5 mmol/L — CL (ref 3.5–5.1)
Sodium: 139 mmol/L (ref 135–145)
Total Bilirubin: 0.8 mg/dL (ref 0.3–1.2)
Total Protein: 4.8 g/dL — ABNORMAL LOW (ref 6.5–8.1)

## 2020-01-11 LAB — RESP PANEL BY RT-PCR (FLU A&B, COVID) ARPGX2
Influenza A by PCR: NEGATIVE
Influenza B by PCR: NEGATIVE
SARS Coronavirus 2 by RT PCR: POSITIVE — AB

## 2020-01-11 LAB — URINE CULTURE: Culture: NO GROWTH

## 2020-01-11 LAB — LACTIC ACID, PLASMA: Lactic Acid, Venous: 2.2 mmol/L (ref 0.5–1.9)

## 2020-01-11 LAB — PROTIME-INR
INR: 1.5 — ABNORMAL HIGH (ref 0.8–1.2)
Prothrombin Time: 17.8 seconds — ABNORMAL HIGH (ref 11.4–15.2)

## 2020-01-11 LAB — APTT: aPTT: 50 seconds — ABNORMAL HIGH (ref 24–36)

## 2020-01-11 MED ORDER — SODIUM CHLORIDE 0.9 % IV SOLN
0.2000 mg/h | INTRAVENOUS | Status: DC
  Administered 2020-01-11: 16:00:00 0.2 mg/h via INTRAVENOUS
  Filled 2020-01-11: qty 5

## 2020-01-11 MED ORDER — ONDANSETRON HCL 4 MG/2ML IJ SOLN
4.0000 mg | Freq: Four times a day (QID) | INTRAMUSCULAR | Status: DC | PRN
  Administered 2020-01-11: 05:00:00 4 mg via INTRAVENOUS
  Filled 2020-01-11: qty 2

## 2020-01-11 MED ORDER — MAGNESIUM SULFATE 2 GM/50ML IV SOLN
2.0000 g | Freq: Once | INTRAVENOUS | Status: AC
  Administered 2020-01-11: 02:00:00 2 g via INTRAVENOUS
  Filled 2020-01-11: qty 50

## 2020-01-11 MED ORDER — GLYCOPYRROLATE 1 MG PO TABS
1.0000 mg | ORAL_TABLET | ORAL | Status: DC | PRN
  Filled 2020-01-11: qty 1

## 2020-01-11 MED ORDER — LORAZEPAM 1 MG PO TABS: 1.0000 mg | ORAL_TABLET | ORAL | Status: DC | PRN

## 2020-01-11 MED ORDER — BIOTENE DRY MOUTH MT LIQD: 15.0000 mL | OROMUCOSAL | Status: DC | PRN

## 2020-01-11 MED ORDER — HYDROMORPHONE HCL 1 MG/ML IJ SOLN
0.5000 mg | INTRAMUSCULAR | Status: DC | PRN
  Administered 2020-01-11: 0.5 mg via INTRAVENOUS
  Filled 2020-01-11: qty 1

## 2020-01-11 MED ORDER — ONDANSETRON HCL 4 MG/2ML IJ SOLN: 4.0000 mg | Freq: Four times a day (QID) | INTRAMUSCULAR | Status: DC | PRN

## 2020-01-11 MED ORDER — GLYCOPYRROLATE 0.2 MG/ML IJ SOLN
0.2000 mg | INTRAMUSCULAR | Status: DC | PRN
  Filled 2020-01-11: qty 1

## 2020-01-11 MED ORDER — LORAZEPAM 2 MG/ML PO CONC
1.0000 mg | ORAL | Status: DC | PRN
  Filled 2020-01-11: qty 0.5

## 2020-01-11 MED ORDER — HYDROMORPHONE HCL 1 MG/ML IJ SOLN
0.5000 mg | INTRAMUSCULAR | Status: DC | PRN
  Administered 2020-01-11: 0.5 mg via INTRAVENOUS
  Filled 2020-01-11: qty 0.5

## 2020-01-11 MED ORDER — POTASSIUM CHLORIDE 10 MEQ/100ML IV SOLN
10.0000 meq | INTRAVENOUS | Status: DC
  Administered 2020-01-11 (×2): 10 meq via INTRAVENOUS
  Filled 2020-01-11 (×2): qty 100

## 2020-01-11 MED ORDER — LORAZEPAM 2 MG/ML PO CONC
1.0000 mg | ORAL | Status: DC | PRN
Start: 2020-01-11 — End: 2020-01-11

## 2020-01-11 MED ORDER — LORAZEPAM 2 MG/ML IJ SOLN: 1.0000 mg | INTRAMUSCULAR | Status: DC | PRN

## 2020-01-11 MED ORDER — ONDANSETRON 4 MG PO TBDP: 4.0000 mg | ORAL_TABLET | Freq: Four times a day (QID) | ORAL | Status: DC | PRN

## 2020-01-11 MED ORDER — ACETAMINOPHEN 325 MG PO TABS: 650.0000 mg | ORAL_TABLET | Freq: Four times a day (QID) | ORAL | Status: DC | PRN

## 2020-01-11 MED ORDER — OXYCODONE HCL 20 MG/ML PO CONC: 5.0000 mg | ORAL | Status: DC | PRN

## 2020-01-11 MED ORDER — ACETAMINOPHEN 650 MG RE SUPP: 650.0000 mg | Freq: Four times a day (QID) | RECTAL | Status: DC | PRN

## 2020-01-11 MED ORDER — OXYCODONE HCL 20 MG/ML PO CONC
5.0000 mg | ORAL | Status: DC | PRN
Start: 2020-01-11 — End: 2020-01-12

## 2020-01-11 MED ORDER — DIPHENHYDRAMINE HCL 50 MG/ML IJ SOLN: 12.5000 mg | INTRAMUSCULAR | Status: DC | PRN

## 2020-01-11 MED ORDER — POLYVINYL ALCOHOL 1.4 % OP SOLN
1.0000 [drp] | Freq: Four times a day (QID) | OPHTHALMIC | Status: DC | PRN
Start: 2020-01-11 — End: 2020-01-12
  Filled 2020-01-11: qty 15

## 2020-01-12 ENCOUNTER — Telehealth: Payer: Self-pay | Admitting: Medical Oncology

## 2020-01-12 NOTE — Telephone Encounter (Signed)
err

## 2020-01-16 LAB — CULTURE, BLOOD (ROUTINE X 2)
Culture: NO GROWTH
Culture: NO GROWTH

## 2020-01-16 NOTE — ED Notes (Signed)
Family is with pt at bedside for support. Pt DNR stickers have been placed on armband. Remains on monitor, water given per request.

## 2020-01-16 NOTE — ED Notes (Signed)
Dr.Mesner is speaking with the patient and family in detail about patients prognosis and status ( DNR, full code, etc.)

## 2020-01-16 NOTE — ED Notes (Signed)
Family remains at bedside with pt for support. When asked pt nods head no  When asked if she is in pain. Lights remain  dim for comfort. On monitor x4 will continue comfort care.

## 2020-01-16 NOTE — ED Provider Notes (Signed)
Medical screening examination/treatment/procedure(s) were conducted as a shared visit with non-physician practitioner(s) and myself.  I personally evaluated the patient during the encounter.  50 year old female with notable medical history for metastatic cancer to multiple parts of her brain the presents the emerge department today in septic shock.  Patient was initially borderline unresponsive and agonal breathing.  EMS said her sats were 50% on room air.  Blood pressures were in the 71I systolic.  Patient was immediately started on sepsis protocol and considered intubation.  During resuscitation patient's mental status started to improve and her vital signs started to improve.  Still requiring large amounts of oxygen.  Appropriate cultures antibiotics were given.  Mental status continued to improve still requiring large amounts of oxygen.  Her brother arrived at bedside and via interpreter it was made known that patient would not want resuscitation.  After long discussion with her and her brother it sounds like she is had cancer for a while and she is tired of fighting it.  She understands that her immune system is not a cure and now that she has Covid it is unlikely that she can make it through this illness.  She states that she is ready to die.  She does not want to take her own life but she does not want extraordinary measures to extend it.  She states she wants to be comfortable.  She states that this time she was still like to continue fluids and supplemental oxygen for comfort but no treatments or further testing.  However, would like pain medication and anxiety medicine if necessary. Orders placed.  Will observe in the emergency room for a little bit and if she is somewhat stable will admit to hospitalist for the same.     CRITICAL CARE Performed by: Merrily Pew Total critical care time: 75 minutes Critical care time was exclusive of separately billable procedures and treating other  patients. Critical care was necessary to treat or prevent imminent or life-threatening deterioration. Critical care was time spent personally by me on the following activities: development of treatment plan with patient and/or surrogate as well as nursing, discussions with consultants, evaluation of patient's response to treatment, examination of patient, obtaining history from patient or surrogate, ordering and performing treatments and interventions, ordering and review of laboratory studies, ordering and review of radiographic studies, pulse oximetry and re-evaluation of patient's condition.    Merrily Pew, MD 01/12/20 0111

## 2020-01-16 NOTE — Progress Notes (Signed)
Chaplain checked in with Deborah Foster's nurse in the ED.  Chaplain worked to understand the ways she could provide support to Deborah Foster or family.  Nurse let chaplain know that family does speak [place] and would need interpreter and that she would also be going to a unit soon.  Chaplain asked nurse to let her know of what needs may present themselves from family or Deborah Foster.  Chaplain will follow-up once Deborah Foster is settled on unit.    02-05-2020 1000  Clinical Encounter Type  Visited With Health care provider  Visit Type Initial

## 2020-01-16 NOTE — ED Notes (Signed)
Pt has been medicated per MD orders. Comfort measures in place.

## 2020-01-16 NOTE — Progress Notes (Signed)
   02-10-20 April 23, 2098  Attending [place]  Attending Physician Notified Y  Attending Physician (First and Last Name) Lang Snow  Will the above attending physician sign death certificate? Yes  Post Mortem Checklist  Date of Death 02/10/20  Time of Death April 23, 2053  Pronounced By Raeanne Gathers NR and Bess Kinds RN  Next of kin notified Yes  Name of next of kin notified of death Radhika, [place]  Contact Person's Relationship to Patient [employer] Person's Phone Number [phone], [phone]  Contact Person's address [address], [place] [address]  Family Communication Notes Spoke with Volo and he would like to come see her one  more time. he states eh will notify her husband and rest of family.  Was the patient a No Code Blue or a Limited Code Blue? Yes  Did the patient die unattended? No  Patient restrained? Not applicable  Height 4\' 11"  (1.499 m)  Weight 43 kg  Body preparation complete Y  [place] [facility]  Notification Date February 10, 2020  Notification Time 04-24-03  [place] [facility] Number Ref # [id number]  Is patient a potential donor? N  Autopsy  Autopsy requested by N/A  Patient and Hospital Property Returned  Patient belongings from bedside/safe/pharmacy returned  None (family wishes for anything left to be disposed of)  Dermatherapy linen/gowns NOT sent with patient or transporter Not applicable  Notifications  Patient Placement notified that Post Mortem checklist is complete Yes  Patient Placement notified body transferred Transported to morgue  Other Notifications (Specify) Thompson Grayer (family friend helping with arrangements) notified. She left her number in case and can be reached at [phone].  Medical Examiner  Is this a medical examiner's case? [place] home name/address/phone # [name] Friar [facility] - [address] [place] - [phone]  Planned location of pickup [facility]

## 2020-01-16 NOTE — ED Notes (Signed)
Dr. Dayna Barker is with pt and famil at bedside. Dr. Dayna Barker has removed pt's O2 per request. Pt and family states understanding of pt's O2 status dropping. Esteban has been utilized to translate.

## 2020-01-16 NOTE — ED Notes (Signed)
LAB CALLED AND STATES PT IS COVID POSITIVE. PRECAUTIONS PUT INTO PLACE, PROVIDER MADE AWARE.

## 2020-01-16 NOTE — Progress Notes (Signed)
Patient arrived to unit with sister at bedside. Transferred from stretcher to bed. Telemetry applied to patient as ordered. Comfort measures only in place at this time. Dilaudid 0.5mg  prn given via IV due to patient noted to be in pain. Patient now resting comfortably with eyes closed. Sister at bedside and taught proper PPE.

## 2020-01-16 NOTE — ED Notes (Signed)
CRITICAL LABS:   LACTIC 2.2 POTASSIUM 2.5  PROVIDER MADE AWARE

## 2020-01-16 NOTE — ED Notes (Signed)
Pt's husban  Has joined pt at bedside for support.

## 2020-01-16 NOTE — ED Notes (Signed)
Pt is asking for O2 to be removed along with pain meds. Dr. Dayna Barker notified.

## 2020-01-16 NOTE — ED Notes (Signed)
Family remains with pt at bedside for end of life support. comfort measures implemented, will continue to monitor.

## 2020-01-16 NOTE — ED Notes (Signed)
ED Provider at bedside to speak with family and pt about prognosis. Deborah Foster is being utilized

## 2020-01-16 NOTE — ED Notes (Signed)
ABX and fluids have been started.

## 2020-01-16 NOTE — Progress Notes (Signed)
I witnessed the waste of approx 6ml  of a 154ml dilaudid drip into stericycle container

## 2020-01-16 NOTE — ED Notes (Signed)
IV established and in right forearm ( 20 g) and 22g in left forearm by EMS. No family has joined pt at bedside, will continue to monitor closely.

## 2020-01-16 NOTE — Death Summary Note (Addendum)
DEATH SUMMARY   Patient Details  Name: Deborah Foster MRN: [id number] DOB: [birth year]  Admission/Discharge Information   Admit Date:  January 21, 2020  Date of Death: Date of Death: 01-22-20  Time of Death: Time of Death: 04-Apr-2053  Length of Stay: 1  Referring Physician: [name], MD   Reason(s) for Hospitalization  Septic Shock secondary to COVID-19  Diagnoses  Preliminary cause of death:  Septic Shock secondary to COVID-19 Secondary Diagnoses (including complications and co-morbidities):  Active Problems:   Septic shock (HCC) Acute hypoxic respiratory failure Hx of Stage IV non-small cell carcinoma of lung with brain mets Hypokalemia Normocytic anemia Hypoalbuminemia Moderate protein-calorie malnutrition GERD  Brief Hospital Course (including significant findings, care, treatment, and services provided and events leading to death)  Deborah Foster is a 50 y.o. year old female who medical history significant of metastatic brain cancer. Presenting with increasing confusion and weakness. 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 Family became concerned because she was increasingly confused and weak. EMS was called. She was found to be hypoxic and in shock. In the ED, she was found to be hypoxic and hypotensive. She was started on fluid boluses and abx. The patient's mentation improved. She was found to be COVID positive. EDP and pt/family had a discussion about her prognosis. The pt/family determined that she wants to be DNR and comfort care measures. She does not want any further treatment of her chronic or acute condition. She is requesting to be made comfortable. [facility] was called for admission. She was not expected to survive this admission. She was placed on inpatient comfort care measures. She passed peacefully at 04/04/2053 hrs 01-22-2020.   Pertinent Labs and Studies  Significant Diagnostic Studies CT ABDOMEN  PELVIS W CONTRAST  Result Date: 12/29/2019 CLINICAL DATA:  Abdominal and back pain. History of metastatic lung cancer. EXAM: CT ABDOMEN AND PELVIS WITH CONTRAST TECHNIQUE: Multidetector CT imaging of the abdomen and pelvis was performed using the standard protocol following bolus administration of intravenous contrast. CONTRAST:  3mL OMNIPAQUE IOHEXOL 300 MG/ML  SOLN COMPARISON:  CT scan 11/21/2019 and 11/07/2019. FINDINGS: Lower chest: Small right pleural effusion is slightly larger when compared to the prior study. Stable radiation changes involving the right lower lobe. No findings suspicious for recurrent tumor. No metastatic pulmonary lesions are identified. The heart is normal in size. No pericardial effusion. Hepatobiliary: Heterogeneous appearance of the liver parenchyma may be due to an inflammatory process/hepatitis. There is also progressive intrahepatic biliary dilatation and significant common bile duct dilatation. It measures approximately 14.5 mm in the porta hepatis and a maximum of 9 mm in the head of the pancreas. Could not exclude a obstructing common bile duct stone. No obvious gallstones. Pericholecystic fluid is noted. Pancreas: The pancreas is enlarged/prominent and the head region demonstrates diffuse low attenuation. This could represent decreased perfusion due to inflammation. Recommend correlation any clinical findings for pancreatitis. Low-attenuation lesions in the body/tail junction region are also noted may be inflammatory. Spleen: Spleen is small.  No focal lesions. Adrenals/Urinary Tract: Adrenal glands are unremarkable. There are changes consistent with pyelonephritis involving the right kidney in particular most notably in the lower pole region. Is also a ureteral thickening and enhancement bilaterally suggesting Pyo ureteral nephrosis. No obvious bladder wall thickening or bladder mass. Stomach/Bowel: Stomach demonstrates mild uniform wall thickening mucosal enhancement.  Similar findings involving the duodenum. Findings could be due to gastritis and  duodenitis. No findings for small bowel obstruction colonic mass. Vascular/Lymphatic: The aorta and branch vessels are patent. The major venous structures are patent. No mesenteric or retroperitoneal mass or adenopathy. Small scattered lymph nodes are stable. Reproductive: The uterus and ovaries are grossly normal. Other: Small to moderate amount of free abdominal/pelvic fluid and diffuse mesenteric edema. Musculoskeletal: Stable sclerotic osseous metastatic disease. No new or progressive findings. IMPRESSION: 1. Progressive intrahepatic and common bile duct dilatation. Could not exclude a obstructing common bile duct stone. Recommend correlation with with liver function studies. 2. CT findings consistent with pyelonephritis and pyoureteronephrosis. 3. Abnormal appearance of the pancreas as detailed above. Recommend correlation with amylase and lipase levels. Possible acute pancreatitis. 4. Small to moderate amount of free abdominal/pelvic fluid and diffuse mesenteric edema. 5. Stable sclerotic osseous metastatic disease. No new or progressive findings. 6. Stable radiation changes involving the right lower lobe. No findings suspicious for recurrent tumor. 7. Heterogeneous appearance of the liver parenchyma may be due to an inflammatory process/hepatitis. 8. Aortic atherosclerosis. Aortic Atherosclerosis (ICD10-I70.0). Electronically Signed   By: Marijo Sanes M.D.   On: 12/29/2019 16:19   VAS [place] LOWER EXTREMITY VENOUS (DVT) (ONLY [facility] & [facility])  Result Date: 12/29/2019  Lower Venous DVT Study Indications: Swelling.  Risk Factors: Cancer. Comparison Study: No prior studies. Performing Technologist: Oliver Hum RVT  Examination Guidelines: A complete evaluation includes B-mode imaging, spectral Doppler, color Doppler, and power Doppler as needed of all accessible portions of each vessel. Bilateral testing is considered an integral part  of a complete examination. Limited examinations for reoccurring indications may be performed as noted. The reflux portion of the exam is performed with the patient in reverse Trendelenburg.  +---------+---------------+---------+-----------+----------+--------------+ RIGHT    CompressibilityPhasicitySpontaneityPropertiesThrombus Aging +---------+---------------+---------+-----------+----------+--------------+ CFV      Full           Yes      Yes                                 +---------+---------------+---------+-----------+----------+--------------+ SFJ      None           No       No                   Acute          +---------+---------------+---------+-----------+----------+--------------+ FV Prox  Full                                                        +---------+---------------+---------+-----------+----------+--------------+ FV Mid   Full                                                        +---------+---------------+---------+-----------+----------+--------------+ FV DistalFull                                                        +---------+---------------+---------+-----------+----------+--------------+ PFV      Full                                                        +---------+---------------+---------+-----------+----------+--------------+  POP      Full           Yes      Yes                                 +---------+---------------+---------+-----------+----------+--------------+ PTV      Full                                                        +---------+---------------+---------+-----------+----------+--------------+ PERO     Full                                                        +---------+---------------+---------+-----------+----------+--------------+ GSV      None           No       No                   Acute          +---------+---------------+---------+-----------+----------+--------------+    +---------+---------------+---------+-----------+----------+--------------+ LEFT     CompressibilityPhasicitySpontaneityPropertiesThrombus Aging +---------+---------------+---------+-----------+----------+--------------+ CFV      Full           Yes      Yes                                 +---------+---------------+---------+-----------+----------+--------------+ SFJ      None           No       No                   Acute          +---------+---------------+---------+-----------+----------+--------------+ FV Prox  Full                                                        +---------+---------------+---------+-----------+----------+--------------+ FV Mid   Full                                                        +---------+---------------+---------+-----------+----------+--------------+ FV DistalFull                                                        +---------+---------------+---------+-----------+----------+--------------+ PFV      Full                                                        +---------+---------------+---------+-----------+----------+--------------+  POP      Full           Yes      Yes                                 +---------+---------------+---------+-----------+----------+--------------+ PTV      Full                                                        +---------+---------------+---------+-----------+----------+--------------+ PERO     Full                                                        +---------+---------------+---------+-----------+----------+--------------+ GSV      None           No       No                   Acute          +---------+---------------+---------+-----------+----------+--------------+     Summary: RIGHT: - No DVT Right lower extremity. - Findings consistent with acute superficial vein thrombosis involving the right great saphenous vein and SFJ. - No cystic structure found in the  popliteal fossa.  LEFT: - No DVT of Left lower extremity. - Findings consistent with acute superficial vein thrombosis involving the left great saphenous vein and SFJ. - No cystic structure found in the popliteal fossa.  *See table(s) above for measurements and observations. Electronically signed by Deitra Mayo MD on 12/29/2019 at 6:13:44 PM.    Final    US Abdomen Limited RUQ (LIVER/GB)  Result Date: 12/29/2019 CLINICAL DATA:  50 year old female with elevated LFTs. History of lung cancer. EXAM: ULTRASOUND ABDOMEN LIMITED RIGHT UPPER QUADRANT COMPARISON:  CT abdomen pelvis dated 12/29/2019. FINDINGS: Gallbladder: No gallstone. There is diffuse thickened appearance of the gallbladder wall measuring 5 mm in thickness, likely related to ascites. Negative sonographic Murphy's sign. Common bile duct: Diameter: 18 mm. The common bile duct is dilated. No echogenic stone noted within the visualized CBD. Liver: There is coarsened appearance of the liver, likely early changes of cirrhosis. Clinical correlation is recommended. The liver demonstrates a normal echogenicity. Portal vein is patent on color Doppler imaging with normal direction of blood flow towards the liver. Other: Small ascites and partially visualized small right pleural effusion. IMPRESSION: 1. Dilated CBD. No stone identified within the gallbladder or visualized CBD. 2. Coarsened liver.  Correlation with LFTs recommended. 3. Small ascites and partially visualized right pleural effusion. Electronically Signed   By: Anner Crete M.D.   On: 12/29/2019 19:42    Microbiology Recent Results (from the past 240 hour(s))  Resp Panel by RT-PCR (Flu A&B, Covid) Urine, Catheterized     Status: Abnormal   Collection Time: 01/03/2020 11:50 PM   Specimen: Urine, Catheterized; Nasopharyngeal(NP) swabs in vial transport medium  Result Value Ref Range Status   SARS Coronavirus 2 by RT PCR POSITIVE (A) NEGATIVE Final    Comment: CRITICAL RESULT CALLED  TO, READ BACK BY AND VERIFIED WITH: SHARRON DAVIS RN Jan 15, 2020@0120  BY P.HENDERSON (NOTE) SARS-CoV-2 target nucleic acids are DETECTED.  The SARS-CoV-2 RNA is generally detectable in upper respiratory specimens during the acute phase of infection. Positive results are indicative of the presence of the identified virus, but do not rule out bacterial infection or co-infection with other pathogens not detected by the test. Clinical correlation with patient history and other diagnostic information is necessary to determine patient infection status. The expected result is Negative.  Fact Sheet for Patients: EntrepreneurPulse.com.au  Fact Sheet for Healthcare Providers: IncredibleEmployment.be  This test is not yet approved or cleared by the [place] FDA and  has been authorized for detection and/or diagnosis of SARS-CoV-2 by FDA under an Emergency Use Authorization (EUA).  This EUA will remain in effect (mea ning this test can be used) for the duration of  the COVID-19 declaration under Section 564(b)(1) of the Act, 21 U.S.C. section 360bbb-3(b)(1), unless the authorization is terminated or revoked sooner.     Influenza A by PCR NEGATIVE NEGATIVE Final   Influenza B by PCR NEGATIVE NEGATIVE Final    Comment: (NOTE) The Xpert Xpress SARS-CoV-2/FLU/RSV plus assay is intended as an aid in the diagnosis of influenza from Nasopharyngeal swab specimens and should not be used as a sole basis for treatment. Nasal washings and aspirates are unacceptable for Xpert Xpress SARS-CoV-2/FLU/RSV testing.  Fact Sheet for Patients: EntrepreneurPulse.com.au  Fact Sheet for Healthcare Providers: IncredibleEmployment.be  This test is not yet approved or cleared by the [place] FDA and has been authorized for detection and/or diagnosis of SARS-CoV-2 by FDA under an Emergency Use Authorization (EUA). This EUA will  remain in effect (meaning this test can be used) for the duration of the COVID-19 declaration under Section 564(b)(1) of the Act, 21 U.S.C. section 360bbb-3(b)(1), unless the authorization is terminated or revoked.  Performed at [facility], [place] [address], [place], [place] [address]   Urine culture     Status: None   Collection Time: 12/16/2019 11:50 PM   Specimen: Urine, Catheterized  Result Value Ref Range Status   Specimen Description   Final    URINE, CATHETERIZED Performed at [facility] [address], [place], [place] [address]    Special Requests   Final    NONE Performed at [facility], [place] [address] [place], [place] [address]    Culture   Final    NO GROWTH Performed at [facility], [place] [address] [place], [place] [address]    Report Status 01/21/2020 FINAL  Final    Lab Basic Metabolic Panel: Recent Labs  Lab 12/23/2019 2350  NA 139  K 2.5*  CL 98  CO2 30  GLUCOSE 105*  BUN 26*  CREATININE 0.94  CALCIUM 7.4*   Liver Function Tests: Recent Labs  Lab 01/06/2020 2350  AST 29  ALT 13  ALKPHOS 70  BILITOT 0.8  PROT 4.8*  ALBUMIN 1.9*   No results for input(s): LIPASE, AMYLASE in the last 168 hours. No results for input(s): AMMONIA in the last 168 hours. CBC: Recent Labs  Lab 12/30/2019 2350  WBC 11.5*  NEUTROABS 9.4*  HGB 7.9*  HCT 24.1*  MCV 89.9  PLT 21*   Cardiac Enzymes: No results for input(s): CKTOTAL, CKMB, CKMBINDEX, TROPONINI in the last 168 hours. Sepsis Labs: Recent Labs  Lab 01/04/2020 2350 January 21, 2020 0001  WBC 11.5*  --   LATICACIDVEN  --  2.2*    Procedures/Operations  None   Chanya Chrisley A Adonis Yim 01/12/2020, 7:22 AM

## 2020-01-16 NOTE — Progress Notes (Signed)
This patient is well known to the palliative service.Chart reviewed-Additional comfort orders placed- will follow up this afternoon.  Lane Hacker, DO Palliative Medicine

## 2020-01-16 NOTE — H&P (Signed)
History and Physical    TAKILA KRONBERG XTG:[id number] DOB: [birth year] DOA: 12/20/2019  PCP: [name], MD  Patient coming from: Home  Chief Complaint: AMS  HPI: SHARINE CADLE is a 50 y.o. female with medical history significant of metastatic brain cancer. Presenting with increasing confusion and weakness. 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 Family became concerned because she was increasingly confused and weak. EMS was called. She was found to be hypoxic and in shock.   ED Course: She was found to be hypoxic and hypotensive. She was started on fluid boluses and abx. The patient's mentation improved. She was found to be COVID positive. EDP and pt/family had a discussion about her prognosis. The pt/family determined that she wants to be DNR and comfort care measures. She does not want any further treatment of her chronic or acute condition. She is requesting to be made comfortable. TRH was called for admission.   Review of Systems:  Review of systems is otherwise negative for all not mentioned in HPI.   PMHx Past Medical History:  Diagnosis Date  . Bone cancer ([place])   . Bone metastasis ([place])   . Hemoptysis   . Hypokalemia   . lung ca dx'd 07/2014  . Lung mass   . Metastasis to adrenal gland ([place])   . Metastasis to brain ([place])   . Pneumonia   . Radiation 08/23/14-09/07/14   Brain/chest and left hip 30 Gy 12 Fx  . URI (upper respiratory infection) 02-02-2015    PSHx Past Surgical History:  Procedure Laterality Date  . IR GENERIC HISTORICAL  07/14/2015   IR RADIOLOGIST EVAL & MGMT 07/14/2015 Greggory Keen, MD [facility] INTERV RAD  . VIDEO BRONCHOSCOPY Bilateral 08/09/2014   Procedure: VIDEO BRONCHOSCOPY WITHOUT FLUORO;  Surgeon: [name], MD;  Location: [facility] ENDOSCOPY;  Service: Cardiopulmonary;  Laterality: Bilateral;  . VIDEO BRONCHOSCOPY Bilateral 07/06/2015   Procedure: VIDEO BRONCHOSCOPY  WITHOUT FLUORO;  Surgeon: [name], MD;  Location: [facility] ENDOSCOPY;  Service: Cardiopulmonary;  Laterality: Bilateral;    SocHx  reports that she has never smoked. She has never used smokeless tobacco. She reports that she does not drink alcohol and does not use drugs.  Allergies  Allergen Reactions  . Gabapentin Other (See Comments)    Abdominal pain  . Other Other (See Comments)    Patient states that there is some she takes that makes her throat "cry" AND SCRATCHY   . Tramadol Nausea And Vomiting    Lowers seizure threshold  . Doxycycline Nausea And Vomiting    FamHx Family History  Problem Relation Age of Onset  . Hyperlipidemia Mother     Prior to Admission medications   Medication Sig Start Date End Date Taking? Authorizing Provider  calcium-vitamin D (OSCAL WITH D) 500-200 MG-UNIT tablet Take 1 tablet by mouth 3 (three) times daily. 11/27/19   Eugenie Filler, MD  Cyanocobalamin (VITAMIN B-12) 2500 MCG SUBL Place 2,500 mcg under the tongue daily.    [provider]  dexamethasone (DECADRON) 4 MG tablet Take 4 mg by mouth 2 (two) times daily. Qty. 28, 14 day supply    [provider]  diclofenac Sodium (VOLTAREN) 1 % GEL Apply 2 g topically 4 (four) times daily. 11/27/19   Eugenie Filler, MD  ferrous sulfate 325 (65 FE) MG tablet Take 325 mg by mouth daily with breakfast.    [provider]  folic acid (FOLVITE) 829 MCG tablet Take 800 mcg by mouth daily.    [provider]  furosemide (LASIX) 80 MG tablet Take 40-80 mg by mouth daily as needed for fluid. 12/14/19   [provider]  gabapentin (NEURONTIN) 300 MG capsule Take 600 mg by mouth 2 (two) times daily. 12/16/19   [provider]  hydrocortisone (ANUSOL-HC) 2.5 % rectal cream Place rectally every 4 (four) hours as needed for hemorrhoids or anal itching. 01/03/20   Georgette Shell, MD  HYDROmorphone (DILAUDID) 4 MG tablet Take 1 tablet (4 mg total)  by mouth every 6 (six) hours as needed for severe pain. 11/27/19   Acquanetta Chain, DO  lactulose (CHRONULAC) 10 GM/15ML solution Take 30 mLs (20 g total) by mouth 2 (two) times daily as needed for moderate constipation. 11/30/19   Tanner, Lyndon Code., PA-C  levETIRAcetam (KEPPRA) 250 MG tablet Take 1 tablet (250 mg total) by mouth 2 (two) times daily. 07/02/19   Ventura Sellers, MD  loratadine (CLARITIN) 10 MG tablet Take 1 tablet (10 mg total) by mouth daily. Patient taking differently: Take 10 mg by mouth daily as needed for allergies. 04/30/16   [name], MD  morphine (MS CONTIN) 30 MG 12 hr tablet Take 1 tablet (30 mg total) by mouth every 12 (twelve) hours. 12/08/19   [name], MD  Multiple Vitamin (MULTIVITAMIN WITH MINERALS) TABS tablet Take 1 tablet by mouth daily. 01/04/20   Georgette Shell, MD  ondansetron (ZOFRAN) 8 MG tablet Take 8 mg by mouth every 8 (eight) hours as needed for nausea or vomiting.  06/30/19   [provider]  polyethylene glycol (MIRALAX / GLYCOLAX) 17 g packet Take 17 g by mouth 2 (two) times daily. 11/27/19   Eugenie Filler, MD  senna-docusate (SENOKOT-S) 8.6-50 MG tablet Take 1 tablet by mouth 2 (two) times daily. 11/27/19   Eugenie Filler, MD  TAGRISSO 80 MG tablet TAKE 1 TABLET (80 MG TOTAL) BY MOUTH DAILY. Patient taking differently: Take 80 mg by mouth daily. 04/28/19   [name], MD  XARELTO 20 MG TABS tablet Take 20 mg by mouth every evening. 12/03/19   [provider]    Physical Exam: Vitals:   01-17-2020 0436 2020-01-17 0448 01-17-2020 0533 01/17/2020 0628  BP: (!) 81/47 (!) 76/48 (!) 76/51 (!) 79/38  Pulse: (!) 118 (!) 118 (!) 119 (!) 120  Resp: (!) 26 (!) 25 (!) 25 (!) 23  Temp:      TempSrc:      SpO2: (!) 80% (!) 62% (!) 62% (!) 61%  Weight:      Height:        General: 50 y.o. ill appearing female resting in bed Eyes: PERRL, normal sclera ENMT: Nares patent w/o discharge, orophaynx clear, ears  w/o discharge/lesions/ulcers Neck: Supple, trachea midline Cardiovascular: tachy, +S1, S2, no m/g/r, pulses are thready Respiratory: decreased at bases, increased WOB on RA GI: BS+, NDNT, no masses noted, no organomegaly noted MSK: No e/c/c Skin: No rashes, bruises, ulcerations noted Neuro: A&O x 3, no focal deficits Psyc: Appropriate interaction and flat affect, calm/cooperative  Labs on Admission: I have personally reviewed following labs and imaging studies  CBC: Recent Labs  Lab 12/25/2019 2350  WBC 11.5*  NEUTROABS 9.4*  HGB 7.9*  HCT 24.1*  MCV 89.9  PLT 21*   Basic Metabolic Panel: Recent Labs  Lab 01/06/2020 2350  NA 139  K 2.5*  CL  98  CO2 30  GLUCOSE 105*  BUN 26*  CREATININE 0.94  CALCIUM 7.4*   GFR: Estimated Creatinine Clearance: 50.2 mL/min (by C-G formula based on SCr of 0.94 mg/dL). Liver Function Tests: Recent Labs  Lab 01/03/2020 2350  AST 29  ALT 13  ALKPHOS 70  BILITOT 0.8  PROT 4.8*  ALBUMIN 1.9*   No results for input(s): LIPASE, AMYLASE in the last 168 hours. No results for input(s): AMMONIA in the last 168 hours. Coagulation Profile: Recent Labs  Lab 01/04/2020 2350  INR 1.5*   Cardiac Enzymes: No results for input(s): CKTOTAL, CKMB, CKMBINDEX, TROPONINI in the last 168 hours. BNP (last 3 results) No results for input(s): PROBNP in the last 8760 hours. HbA1C: No results for input(s): HGBA1C in the last 72 hours. CBG: No results for input(s): GLUCAP in the last 168 hours. Lipid Profile: No results for input(s): CHOL, HDL, LDLCALC, TRIG, CHOLHDL, LDLDIRECT in the last 72 hours. Thyroid Function Tests: No results for input(s): TSH, T4TOTAL, FREET4, T3FREE, THYROIDAB in the last 72 hours. Anemia Panel: No results for input(s): VITAMINB12, FOLATE, FERRITIN, TIBC, IRON, RETICCTPCT in the last 72 hours. Urine analysis:    Component Value Date/Time   COLORURINE YELLOW 01/13/2020 2350   APPEARANCEUR CLEAR 12/16/2019 2350   LABSPEC  1.014 01/13/2020 2350   LABSPEC 1.005 09/26/2015 0920   PHURINE 5.0 01/06/2020 2350   GLUCOSEU NEGATIVE 01/03/2020 2350   GLUCOSEU Negative 09/26/2015 0920   HGBUR NEGATIVE 01/13/2020 2350   BILIRUBINUR NEGATIVE 12/26/2019 2350   BILIRUBINUR Negative 09/26/2015 0920   KETONESUR NEGATIVE 12/22/2019 2350   PROTEINUR NEGATIVE 12/26/2019 2350   UROBILINOGEN 0.2 09/26/2015 0920   NITRITE NEGATIVE 12/16/2019 2350   LEUKOCYTESUR NEGATIVE 12/31/2019 2350   LEUKOCYTESUR Negative 09/26/2015 0920    Radiological Exams on Admission: No results found.  Assessment/Plan Septic Shock secondary to COVID-19     - admit to inpt, med surg     - COVID precautions, but no treatment     - she has elected to be DNR, comfort care measures     - diluadid, ativan for pain/anxiety     - End of life protocol started, palliative care consulted  Hx of Stage IV non-small cell carcinoma of lung with brain mets Hypokalemia Normocytic anemia Hypoalbuminemia Moderate protein-calorie malnutrition GERD     - plan is as above, comfort care only  DVT prophylaxis: None, comfort care measures  Code Status: DNR/Comfort Care  Family Communication: With family at bedside  Consults called: Palliative care   Status is: Inpatient  Remains inpatient appropriate because:Inpatient level of care appropriate due to severity of illness Dispo: The patient is from: Home              Anticipated d/c is to: Not expected to survive admission.              Anticipated d/c date is: 1 day              Patient currently is not medically stable to d/c.  Jonnie Finner DO [facility]  If 7PM-7AM, please contact night-coverage [email or web address]  02-08-2020, 7:38 AM

## 2020-01-16 NOTE — Progress Notes (Signed)
Wasted Iv bag of Dilaudid with Lyla Son.  Wasted 98 ml of 100 ml bag in steri cycle.

## 2020-01-16 NOTE — ED Notes (Signed)
Pt has been cathed ( in and out) 556ml of amber urine drained from bladder. Pt tolerated well. Sterile technique used, one kit used. UA sent along with culture to lab.

## 2020-01-16 DEATH — deceased

## 2020-01-21 ENCOUNTER — Other Ambulatory Visit: Payer: Medicare Other

## 2020-01-25 ENCOUNTER — Ambulatory Visit: Payer: Medicare Other | Admitting: Internal Medicine

## 2020-02-08 ENCOUNTER — Ambulatory Visit: Payer: Medicare Other | Admitting: Internal Medicine

## 2020-04-12 ENCOUNTER — Other Ambulatory Visit (HOSPITAL_COMMUNITY): Payer: Self-pay

## 2020-04-21 ENCOUNTER — Other Ambulatory Visit (HOSPITAL_COMMUNITY): Payer: Self-pay

## 2020-07-13 ENCOUNTER — Other Ambulatory Visit (HOSPITAL_COMMUNITY): Payer: Self-pay
